# Patient Record
Sex: Male | Born: 1990 | Race: Black or African American | Hispanic: No | Marital: Single
Health system: Southern US, Community
[De-identification: ages and names within clinical notes are randomized; demographics above are authoritative.]

## PROBLEM LIST (undated history)

## (undated) DIAGNOSIS — E119 Type 2 diabetes mellitus without complications: Secondary | ICD-10-CM

## (undated) DIAGNOSIS — F319 Bipolar disorder, unspecified: Secondary | ICD-10-CM

## (undated) DIAGNOSIS — F259 Schizoaffective disorder, unspecified: Secondary | ICD-10-CM

## (undated) HISTORY — DX: Bipolar disorder, unspecified: F31.9

## (undated) HISTORY — DX: Schizoaffective disorder, unspecified: F25.9

---

## 2016-09-04 ENCOUNTER — Emergency Department (HOSPITAL_COMMUNITY)
Admission: EM | Admit: 2016-09-04 | Discharge: 2016-09-05 | Disposition: A | Discharge status: Home / Self Care | Payer: No Typology Code available for payment source | Attending: Emergency Medicine | Admitting: Emergency Medicine

## 2016-09-04 ENCOUNTER — Emergency Department (HOSPITAL_COMMUNITY): Payer: No Typology Code available for payment source

## 2016-09-04 ENCOUNTER — Encounter (HOSPITAL_COMMUNITY): Payer: Self-pay

## 2016-09-04 DIAGNOSIS — Y939 Activity, unspecified: Secondary | ICD-10-CM | POA: Insufficient documentation

## 2016-09-04 DIAGNOSIS — Y92410 Unspecified street and highway as the place of occurrence of the external cause: Secondary | ICD-10-CM | POA: Diagnosis not present

## 2016-09-04 DIAGNOSIS — S4992XA Unspecified injury of left shoulder and upper arm, initial encounter: Secondary | ICD-10-CM | POA: Diagnosis not present

## 2016-09-04 DIAGNOSIS — M25512 Pain in left shoulder: Secondary | ICD-10-CM

## 2016-09-04 DIAGNOSIS — Y999 Unspecified external cause status: Secondary | ICD-10-CM | POA: Insufficient documentation

## 2016-09-04 MED ORDER — IBUPROFEN 800 MG PO TABS: 800.0000 mg | ORAL_TABLET | Freq: Three times a day (TID) | ORAL | 0 refills | Status: DC

## 2016-09-04 MED ORDER — CYCLOBENZAPRINE HCL 10 MG PO TABS: 10.0000 mg | ORAL_TABLET | Freq: Two times a day (BID) | ORAL | 0 refills | Status: DC | PRN
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
## 2016-09-04 NOTE — ED Notes (Addendum)
Patient transported to X-ray 
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
## 2016-09-05 NOTE — ED Notes (Signed)
Pt departed in NAD, refused use of wheelchair.  

## 2017-04-07 ENCOUNTER — Other Ambulatory Visit
Admit: 2017-04-07 | Discharge: 2017-04-07 | Disposition: A | Discharge status: Home / Self Care | Payer: Self-pay | Attending: Family Medicine | Admitting: Family Medicine

## 2017-04-07 ENCOUNTER — Emergency Department
Admission: EM | Admit: 2017-04-07 | Discharge: 2017-04-07 | Discharge status: Absent without leave | Payer: No Typology Code available for payment source

## 2017-11-01 DIAGNOSIS — Z9151 Personal history of suicidal behavior: Secondary | ICD-10-CM

## 2017-11-01 DIAGNOSIS — E109 Type 1 diabetes mellitus without complications: Secondary | ICD-10-CM

## 2017-11-01 HISTORY — DX: Type 1 diabetes mellitus without complications: E10.9

## 2017-11-01 HISTORY — DX: Personal history of suicidal behavior: Z91.51

## 2018-02-03 ENCOUNTER — Emergency Department (HOSPITAL_COMMUNITY)
Admission: EM | Admit: 2018-02-03 | Discharge: 2018-02-04 | Disposition: A | Discharge status: Other Acute Inpatient Hospital | Payer: BLUE CROSS/BLUE SHIELD | Attending: Emergency Medicine | Admitting: Emergency Medicine

## 2018-02-03 ENCOUNTER — Encounter (HOSPITAL_COMMUNITY): Payer: Self-pay | Admitting: Emergency Medicine

## 2018-02-03 DIAGNOSIS — F322 Major depressive disorder, single episode, severe without psychotic features: Secondary | ICD-10-CM | POA: Diagnosis not present

## 2018-02-03 DIAGNOSIS — R45851 Suicidal ideations: Secondary | ICD-10-CM | POA: Insufficient documentation

## 2018-02-03 DIAGNOSIS — F419 Anxiety disorder, unspecified: Secondary | ICD-10-CM | POA: Insufficient documentation

## 2018-02-03 DIAGNOSIS — G4700 Insomnia, unspecified: Secondary | ICD-10-CM | POA: Insufficient documentation

## 2018-02-03 DIAGNOSIS — Z046 Encounter for general psychiatric examination, requested by authority: Secondary | ICD-10-CM | POA: Diagnosis present

## 2018-02-03 NOTE — ED Triage Notes (Signed)
Pt expressed suicidal thoughts of ending his life by jumping off a bridge or shooting himself.  Pt admits he is in the process of trying obtain a gun.  Family accompanied pt, very supportive.  Pt soft spoken, withdrawn.  Family is going to stay w/ pt per Charge RN until seen by provider.  Family took all the pt'ws belongings to the car to take home.  Pt wanded by security

## 2018-02-03 NOTE — ED Notes (Signed)
Called for sitter ?

## 2018-02-04 ENCOUNTER — Encounter (HOSPITAL_COMMUNITY): Payer: Self-pay

## 2018-02-04 ENCOUNTER — Other Ambulatory Visit: Payer: Self-pay

## 2018-02-04 ENCOUNTER — Inpatient Hospital Stay (HOSPITAL_COMMUNITY)
Admission: AD | Admit: 2018-02-04 | Discharge: 2018-02-10 | DRG: 885 | Disposition: A | Discharge status: Home / Self Care | Payer: BLUE CROSS/BLUE SHIELD | Source: Intra-hospital | Attending: Psychiatry | Admitting: Psychiatry

## 2018-02-04 DIAGNOSIS — Z810 Family history of intellectual disabilities: Secondary | ICD-10-CM

## 2018-02-04 DIAGNOSIS — F322 Major depressive disorder, single episode, severe without psychotic features: Secondary | ICD-10-CM | POA: Diagnosis not present

## 2018-02-04 DIAGNOSIS — Z818 Family history of other mental and behavioral disorders: Secondary | ICD-10-CM

## 2018-02-04 DIAGNOSIS — F333 Major depressive disorder, recurrent, severe with psychotic symptoms: Principal | ICD-10-CM

## 2018-02-04 DIAGNOSIS — F419 Anxiety disorder, unspecified: Secondary | ICD-10-CM | POA: Diagnosis present

## 2018-02-04 DIAGNOSIS — R45851 Suicidal ideations: Secondary | ICD-10-CM | POA: Diagnosis present

## 2018-02-04 DIAGNOSIS — F909 Attention-deficit hyperactivity disorder, unspecified type: Secondary | ICD-10-CM | POA: Diagnosis present

## 2018-02-04 DIAGNOSIS — G4700 Insomnia, unspecified: Secondary | ICD-10-CM | POA: Diagnosis present

## 2018-02-04 DIAGNOSIS — F332 Major depressive disorder, recurrent severe without psychotic features: Secondary | ICD-10-CM | POA: Diagnosis present

## 2018-02-04 LAB — COMPREHENSIVE METABOLIC PANEL
ALT: 74 U/L — ABNORMAL HIGH (ref 17–63)
AST: 39 U/L (ref 15–41)
Albumin: 4.3 g/dL (ref 3.5–5.0)
Alkaline Phosphatase: 65 U/L (ref 38–126)
Anion gap: 10 (ref 5–15)
BUN: 16 mg/dL (ref 6–20)
CO2: 26 mmol/L (ref 22–32)
Calcium: 9.8 mg/dL (ref 8.9–10.3)
Chloride: 103 mmol/L (ref 101–111)
Creatinine, Ser: 0.98 mg/dL (ref 0.61–1.24)
GFR calc Af Amer: >60 mL/min (ref >60)
GFR calc non Af Amer: >60 mL/min (ref >60)
Glucose, Bld: 113 mg/dL — ABNORMAL HIGH (ref 65–99)
Potassium: 4.2 mmol/L (ref 3.5–5.1)
Sodium: 139 mmol/L (ref 135–145)
Total Bilirubin: 0.4 mg/dL (ref 0.3–1.2)
Total Protein: 6.9 g/dL (ref 6.5–8.1)

## 2018-02-04 LAB — CBC
HCT: 44.8 % (ref 39.0–52.0)
Hemoglobin: 14.7 g/dL (ref 13.0–17.0)
MCH: 28.9 pg (ref 26.0–34.0)
MCHC: 32.8 g/dL (ref 30.0–36.0)
MCV: 88.2 fL (ref 78.0–100.0)
Platelets: 310 10*3/uL (ref 150–400)
RBC: 5.08 MIL/uL (ref 4.22–5.81)
RDW: 12.9 % (ref 11.5–15.5)
WBC: 5.4 10*3/uL (ref 4.0–10.5)

## 2018-02-04 LAB — SALICYLATE LEVEL: Salicylate Lvl: <7 mg/dL (ref 2.8–30.0)

## 2018-02-04 LAB — RAPID URINE DRUG SCREEN, HOSP PERFORMED
Amphetamines: NOT DETECTED
Barbiturates: NOT DETECTED
Benzodiazepines: NOT DETECTED
Cocaine: NOT DETECTED
Opiates: NOT DETECTED
Tetrahydrocannabinol: NOT DETECTED

## 2018-02-04 LAB — ACETAMINOPHEN LEVEL: Acetaminophen (Tylenol), Serum: <10 ug/mL — ABNORMAL LOW (ref 10–30)

## 2018-02-04 LAB — ETHANOL: Alcohol, Ethyl (B): <10 mg/dL (ref <10)

## 2018-02-04 MED ORDER — TRAZODONE HCL 50 MG PO TABS
50.0000 mg | ORAL_TABLET | Freq: Every evening | ORAL | Status: DC | PRN
  Filled 2018-02-04 (×2): qty 1

## 2018-02-04 MED ORDER — SERTRALINE HCL 50 MG PO TABS
50.0000 mg | ORAL_TABLET | Freq: Every day | ORAL | Status: DC
  Administered 2018-02-04 – 2018-02-10 (×7): 50 mg via ORAL
  Filled 2018-02-04 (×10): qty 1

## 2018-02-04 MED ORDER — ALUM & MAG HYDROXIDE-SIMETH 200-200-20 MG/5ML PO SUSP: 30.0000 mL | ORAL | Status: DC | PRN

## 2018-02-04 MED ORDER — HYDROXYZINE HCL 25 MG PO TABS
25.0000 mg | ORAL_TABLET | Freq: Four times a day (QID) | ORAL | Status: DC | PRN
  Filled 2018-02-04 (×2): qty 1

## 2018-02-04 MED ORDER — ACETAMINOPHEN 325 MG PO TABS: 650.0000 mg | ORAL_TABLET | Freq: Four times a day (QID) | ORAL | Status: DC | PRN

## 2018-02-04 MED ORDER — ENSURE ENLIVE PO LIQD
237.0000 mL | Freq: Two times a day (BID) | ORAL | Status: DC
  Administered 2018-02-04 – 2018-02-10 (×6): 237 mL via ORAL

## 2018-02-04 MED ORDER — MAGNESIUM HYDROXIDE 400 MG/5ML PO SUSP: 30.0000 mL | Freq: Every day | ORAL | Status: DC | PRN

## 2018-02-04 NOTE — Tx Team (Signed)
Initial Treatment Plan 02/04/2018 12:37 PM Jacob [name] [id number]    PATIENT STRESSORS: Occupational concerns Other: increased anxiety and loneliness   PATIENT STRENGTHS: Ability for insight Average or above average intelligence Communication skills Motivation for treatment/growth Physical Health Supportive family/friends   PATIENT IDENTIFIED PROBLEMS:   "loneliness"    "problems with coworker"    "anxiety"           DISCHARGE CRITERIA:  Improved stabilization in mood, thinking, and/or behavior Motivation to continue treatment in a less acute level of care Reduction of life-threatening or endangering symptoms to within safe limits  PRELIMINARY DISCHARGE PLAN: Attend aftercare/continuing care group Outpatient therapy Return to previous living arrangement  PATIENT/FAMILY INVOLVEMENT: This treatment plan has been presented to and reviewed with the patient, Jacob [name],  The patient has been given the opportunity to ask questions and make suggestions.  Shela NevinValerie S [name], RN 02/04/2018, 12:37 PM

## 2018-02-04 NOTE — [facility] Suicide Risk Assessment (Signed)
[facility]   Nursing information obtained from:   patient and chart Demographic factors:   27 year old single male , employed  Current Mental Status:   see below  Loss Factors:   27 year old child in foster care, being adopted , upcoming court date  Historical Factors:   denies prior psychiatric admissions, denies history of severe depression Risk Reduction Factors:   employed, resilience  Total Time spent with patient: 45 minutes Principal Problem:Suicidal Ideations  Diagnosis:   Patient Active Problem List   Diagnosis Date Noted  . Severe recurrent major depression without psychotic features (HCC) [F33.2] 02/04/2018    Continued Clinical Symptoms:  Alcohol Use Disorder Identification Test Final Score (AUDIT): 0 The "Alcohol Use Disorders Identification Test", Guidelines for Use in Primary Care, Second Edition.  World Science writerHealth Organization [facility](WHO). Score between 0-7:  no or low risk or alcohol related problems. Score between 8-15:  moderate risk of alcohol related problems. Score between 16-19:  high risk of alcohol related problems. Score 20 or above:  warrants further diagnostic evaluation for alcohol dependence and treatment.   CLINICAL FACTORS:  27 year old male, presents due to suicidal ideations , currently minimizes depression, but identifies significant psychosocial stressors    Psychiatric Specialty Exam: Physical Exam  ROS  Blood pressure 130/74, pulse 86, temperature 98.8 F (37.1 C), temperature source Oral, resp. rate 16, height 5' 7.5" (1.715 m), weight 59.6 kg (131 lb 6.4 oz), SpO2 99 %.Body mass index is 20.28 kg/m.  See admit note MSE   COGNITIVE FEATURES THAT CONTRIBUTE TO RISK:  Closed-mindedness and Loss of executive function    SUICIDE RISK:   Moderate:  Frequent suicidal ideation with limited intensity, and duration, some specificity in terms of plans, no associated intent, good self-control, limited dysphoria/symptomatology,  some risk factors present, and identifiable protective factors, including available and accessible social support.  PLAN OF CARE: Patient will be admitted to inpatient psychiatric unit for stabilization and safety. Will provide and encourage milieu participation. Provide medication management and maked adjustments as needed.  Will follow daily.    I certify that inpatient services furnished can reasonably be expected to improve the patient's condition.   Craige CottaFernando A Firman Petrow, MD 02/04/2018, 5:32 PM

## 2018-02-04 NOTE — ED Notes (Signed)
Pt voiced agreement and understanding of tx plan [facility] signed consent forms - [facility], copy sent to [facility] Records, and [facility].
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
## 2018-02-04 NOTE — ED Provider Notes (Signed)
[facility] EMERGENCY DEPARTMENT Provider Note   CSN: [id number] Arrival date & time: 02/03/18  2316     History   Chief Complaint Chief Complaint  Patient presents with  . Suicidal    HPI Ballard Congrove is a 27 y.o. male.  27 year old male with no significant past medical history presents to the emergency department for suicidal thoughts.  He reports onset of suicidal ideations today secondary to many "obstacles in my way".  He reports plan to jump off a bridge.  He has also had thoughts about shooting himself in the head.  He does not own or have access to firearms, but was trying to obtain a gun recently.  He reports difficulty between his multiple jobs.  He is also trying to obtain custody of his daughter.  He recently was charged with multiple marijuana misdemeanors.  He reports difficulty balancing these classes as well as the thought of going back to school.  He denies any alcohol or illicit drug use today.  No history of behavioral health hospitalizations.  He presents with family voluntarily.  The history is provided by the patient. No language interpreter was used.    History reviewed. No pertinent past medical history.  There are no active problems to display for this patient.   History reviewed. No pertinent surgical history.      Home Medications    Prior to Admission medications   Medication Sig Start Date End Date Taking? Authorizing Provider  cyclobenzaprine (FLEXERIL) 10 MG tablet Take 1 tablet (10 mg total) by mouth 2 (two) times daily as needed for muscle spasms. Patient not taking: Reported on 02/04/2018 09/04/16   Emi Holes, [name]  ibuprofen (ADVIL,MOTRIN) 800 MG tablet Take 1 tablet (800 mg total) by mouth 3 (three) times daily. Patient not taking: Reported on 02/04/2018 09/04/16   Emi Holes, [name]    Family History No family history on file.  Social History Social History   Tobacco Use  . Smoking status: Never Smoker  .  Smokeless tobacco: Never Used  Substance Use Topics  . Alcohol use: No  . Drug use: Not on file     Allergies   Patient has no known allergies.   Review of Systems Review of Systems Ten systems reviewed and are negative for acute change, except as noted in the HPI.    Physical Exam Updated Vital Signs BP (!) 130/105 (BP Location: Right Arm)   Pulse 66   Temp 97.7 F (36.5 C) (Oral)   Resp 16   SpO2 100%   Physical Exam  Constitutional: He is oriented to person, place, and time. He appears well-developed and well-nourished. No distress.  Nontoxic appearing and in NAD  HENT:  Head: Normocephalic and atraumatic.  Eyes: Conjunctivae and EOM are normal. No scleral icterus.  Neck: Normal range of motion.  Cardiovascular: Normal rate, regular rhythm and intact distal pulses.  Pulmonary/Chest: Effort normal. No respiratory distress.  Respirations even and unlabored  Musculoskeletal: Normal range of motion.  Neurological: He is alert and oriented to person, place, and time. He exhibits normal muscle tone. Coordination normal.  Skin: Skin is warm and dry. No rash noted. He is not diaphoretic. No erythema. No pallor.  Psychiatric: He has a normal mood and affect. His behavior is normal. He expresses suicidal ideation. He expresses no homicidal ideation. He expresses suicidal plans. He expresses no homicidal plans.  Reports SI with plan to jump off bridge.  Nursing note and vitals reviewed.  ED Treatments / Results  Labs (all labs ordered are listed, but only abnormal results are displayed) Labs Reviewed  COMPREHENSIVE METABOLIC PANEL - Abnormal; Notable for the following components:      Result Value   Glucose, Bld 113 (*)    ALT 74 (*)    All other components within normal limits  ACETAMINOPHEN LEVEL - Abnormal; Notable for the following components:   Acetaminophen (Tylenol), Serum <10 (*)    All other components within normal limits  ETHANOL  SALICYLATE LEVEL  CBC    RAPID URINE DRUG SCREEN, HOSP PERFORMED    EKG None  Radiology No results found.  Procedures Procedures (including critical care time)  Medications Ordered in ED Medications - No data to display   Initial Impression / Assessment and Plan / ED Course  I have reviewed the triage vital signs and the nursing notes.  Pertinent labs & imaging results that were available during my care of the patient were reviewed by me and considered in my medical decision making (see chart for details).     Patient presenting for psychiatric evaluation.  He has been medically cleared and evaluated by TTS who recommend inpatient treatment.  Patient currently pending placement.  Disposition to be determined by oncoming ED provider.   Final Clinical Impressions(s) / ED Diagnoses   Final diagnoses:  Current severe episode of major depressive disorder without psychotic features without prior episode [facility](HCC)    ED Discharge Orders    None       Antony MaduraHumes, Maresha Anastos, [name] 02/04/18 0546    Zadie RhineWickline, Donald, MD 02/04/18 [id number]

## 2018-02-04 NOTE — Progress Notes (Signed)
Pt [facility] been [facility]. Pt can arrive after 0800 on Saturday, February 04, 2018.  Room: 400-2 Attending: Dr. Jama Flavorsobos Call to Report: [phone]  This information was provided to pt's nurse, Sherrilyn RistKari RN at 1557 on 02/04/18.
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
## 2018-02-04 NOTE — [facility] Group Notes (Signed)
[facility] Group Notes:  (Nursing)  Date:  02/04/2018  Time:  4:00 PM Type of Therapy:  Nurse Education  Participation Level:  Active  Participation Quality:  Appropriate  Affect:  Appropriate  Cognitive:  Appropriate  Insight:  Appropriate  Engagement in Group:  Engaged  Modes of Intervention:  Discussion, Education and Problem-solving  Summary of Progress/Problems: Patient attended group led by RN : Problem Solving/Open Forum Questions  Shela NevinValerie S Kayron Hicklin 02/04/2018, 5:04 PM

## 2018-02-04 NOTE — Plan of Care (Signed)
  Problem: Education: Goal: Knowledge of [facility] Education information/materials will improve Outcome: Progressing Goal: Verbalization of understanding the information provided will improve Outcome: Progressing   

## 2018-02-04 NOTE — H&P (Signed)
Psychiatric Admission Assessment Adult  Patient Identification: Jacob [name] MRN:  [id number] Date of Evaluation:  02/04/2018 Chief Complaint:   " I was feeling bad " Principal Diagnosis: Depression, Suicidal Ideations  Diagnosis:   Patient Active Problem List   Diagnosis Date Noted  . Severe recurrent major depression without psychotic features ([place]) [F33.2] 02/04/2018   History of Present Illness:27 year old single male,  vague historian . States that on day of admission he had been feeling depressed, with low energy level and had not gone to work due to feeling " bad " and  low energy . He presented to ED voluntarily due to suicidal ideations/reported thoughts of jumping off a bridge or shooting self . Currently states  that these thoughts occurred on day of admission and that he had not felt suicidal prior to that.  He also minimizes recent depression or significant neuro-vegetative symptoms leading to admission. Does not identify any specific stressors that contributed to feeling worse on day of admission, but he does describe chronic stressors-  states that her 27 year old child is in foster care in [place] and that she is up for adoption. He also has upcoming court date due to cannabis possession/ DUI  charges.  Endorses history of Cannabis Use Disorder, but states he has not used in several weeks.     Associated Signs/Symptoms: Depression Symptoms:  suicidal thoughts with specific plan, denies pervasive sadness or changes in sleep, appetite or energy level, denies anhedonia. Describes negative affect he experienced prior to admission as a " bad day " rather than a pattern of depression. (Hypo) Manic Symptoms:  Does not endorse  Anxiety Symptoms:  Reports some increased anxiety recently  Psychotic Symptoms:  Denies  PTSD Symptoms: Denies PTSD symptoms Total Time spent with patient: 45 minutes  Past Psychiatric History: no prior psychiatric admissions, states he has never attempted  suicide, denies history of self cutting . Denies history of psychosis, At this time denies history of prior depressive episodes . Denies history of mania, denies history of violence . Reports history of ADHD diagnosis as child for which he was prescribed Ritalin. States he has not been on any psychiatric medications for years .  Is the patient at risk to self? Yes.    Has the patient been a risk to self in the past 6 months? No.  Has the patient been a risk to self within the distant past? No.  Is the patient a risk to others? No.  Has the patient been a risk to others in the past 6 months? No.  Has the patient been a risk to others within the distant past? No.   Prior Inpatient Therapy:  no  Prior Outpatient Therapy:  no   Alcohol Screening: Patient refused Alcohol Screening Tool: (na) 1. How often do you have a drink containing alcohol?: Never 2. How many drinks containing alcohol do you have on a typical day when you are drinking?: 1 or 2 3. How often do you have six or more drinks on one occasion?: Never AUDIT-C Score: 0 9. Have you or someone else been injured as a result of your drinking?: No 10. Has a relative or friend or a doctor or another health worker been concerned about your drinking or suggested you cut down?: No Alcohol Use Disorder Identification Test Final Score (AUDIT): 0 Intervention/Follow-up: AUDIT Score <7 follow-up not indicated Substance Abuse History in the last 12 months:  Denies alcohol abuse, reports history of cannabis use disorder, states  he stopped smoking 2 months ago. Consequences of Substance Abuse: Does not endorse  Previous Psychotropic Medications: states he had been on Ritalin in the past as a child for ADHD.States he has not been on any medication for many years . Psychological Evaluations: no  Past Medical History: Denies medical illnesses, NKDA. Family History: Parents alive, patient is currently living with them, has two older brothers Family  Psychiatric  History: states one brother is developmentally disabled . States that father has history of depression, no suicides in family Tobacco Screening:  Does not smoke . Social History: single, has one daughter aged two, who is currently in foster care in [place]. 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 It was an open adoption.   Allergies:  No Known Allergies Lab Results:  Results for orders placed or performed during the hospital encounter of 02/03/18 (from the past 48 hour(s))  Rapid urine drug screen (hospital performed)     Status: None   Collection Time: 02/03/18 11:56 PM  Result Value Ref Range   Opiates NONE DETECTED NONE DETECTED   Cocaine NONE DETECTED NONE DETECTED   Benzodiazepines NONE DETECTED NONE DETECTED   Amphetamines NONE DETECTED NONE DETECTED   Tetrahydrocannabinol NONE DETECTED NONE DETECTED   Barbiturates NONE DETECTED NONE DETECTED    Comment: (NOTE) DRUG SCREEN FOR MEDICAL PURPOSES ONLY.  IF CONFIRMATION IS NEEDED FOR ANY PURPOSE, NOTIFY LAB WITHIN 5 DAYS. LOWEST DETECTABLE LIMITS FOR URINE DRUG SCREEN Drug Class                     Cutoff (ng/mL) Amphetamine and metabolites    1000 Barbiturate and metabolites    200 Benzodiazepine                 505 Tricyclics and metabolites     300 Opiates and metabolites        300 Cocaine and metabolites        300 THC                             50 Performed at [facility], [place] [address] [place], [place] [address]   Comprehensive metabolic panel     Status: Abnormal   Collection Time: 02/03/18 11:57 PM  Result Value Ref Range   Sodium 139 135 - 145 mmol/L   Potassium 4.2 3.5 - 5.1 mmol/L   Chloride 103 101 - 111 mmol/L   CO2 26 22 - 32 mmol/L   Glucose, Bld 113 (H) 65 - 99 mg/dL   BUN 16 6 - 20 mg/dL   Creatinine, Ser 0.98 0.61 - 1.24 mg/dL   Calcium 9.8 8.9 - 10.3 mg/dL   Total Protein 6.9 6.5 - 8.1 g/dL   Albumin 4.3 3.5 - 5.0 g/dL   AST 39 15 - 41 U/L   ALT 74 (H) 17 - 63  U/L   Alkaline Phosphatase 65 38 - 126 U/L   Total Bilirubin 0.4 0.3 - 1.2 mg/dL   GFR calc non Af Amer >60 >60 mL/min   GFR calc Af Amer >60 >60 mL/min    Comment: (NOTE) The eGFR has been calculated using the CKD EPI equation. This calculation has not been validated in all clinical situations. eGFR's persistently <60 mL/min signify possible Chronic Kidney Disease.    Anion gap 10 5 - 15    Comment: Performed at [facility] [address] [place], [place] [address]  cbc     Status: None   Collection Time: 02/03/18 11:57 PM  Result Value Ref Range   WBC 5.4 4.0 - 10.5 K/uL   RBC 5.08 4.22 - 5.81 MIL/uL   Hemoglobin 14.7 13.0 - 17.0 g/dL   HCT 44.8 39.0 - 52.0 %   MCV 88.2 78.0 - 100.0 fL   MCH 28.9 26.0 - 34.0 pg   MCHC 32.8 30.0 - 36.0 g/dL   RDW 12.9 11.5 - 15.5 %   Platelets 310 150 - 400 K/uL    Comment: Performed at [facility], [place] [address] [place], [place] [address]  Ethanol     Status: None   Collection Time: 02/03/18 11:58 PM  Result Value Ref Range   Alcohol, Ethyl (B) <10 <10 mg/dL    Comment:        LOWEST DETECTABLE LIMIT FOR SERUM ALCOHOL IS 10 mg/dL FOR MEDICAL PURPOSES ONLY Performed at [facility], [place] [address], [place], [place] [address]   Salicylate level     Status: None   Collection Time: 02/03/18 11:58 PM  Result Value Ref Range   Salicylate Lvl <7.6 2.8 - 30.0 mg/dL     Comment: Performed at [facility] [address], [place], [place] [address]  Acetaminophen level     Status: Abnormal   Collection Time: 02/03/18 11:58 PM  Result Value Ref Range   Acetaminophen (Tylenol), Serum <10 (L) 10 - 30 ug/mL    Comment:        THERAPEUTIC CONCENTRATIONS VARY SIGNIFICANTLY. A RANGE OF 10-30 ug/mL MAY BE AN EFFECTIVE CONCENTRATION FOR MANY PATIENTS. HOWEVER, SOME ARE BEST TREATED AT CONCENTRATIONS OUTSIDE THIS RANGE. ACETAMINOPHEN CONCENTRATIONS >150 ug/mL AT 4 HOURS AFTER INGESTION AND >50 ug/mL AT 12 HOURS AFTER INGESTION ARE OFTEN ASSOCIATED WITH TOXIC REACTIONS. Performed at [facility], [place] [address], [place], [place] [address]     Blood Alcohol level:  Lab Results  Component Value Date   ETH <10 24/58/0998    Metabolic Disorder Labs:  No results found for: HGBA1C, MPG No results found for: PROLACTIN No results found for: CHOL, TRIG, HDL, CHOLHDL, VLDL, LDLCALC  Current Medications: Current Facility-Administered Medications  Medication Dose Route Frequency Provider Last Rate Last Dose  . acetaminophen (TYLENOL) tablet 650 mg  650 mg Oral Q6H PRN Rozetta Nunnery, NP      . alum & mag hydroxide-simeth (MAALOX/MYLANTA) 200-200-20 MG/5ML suspension 30 mL  30 mL Oral Q4H PRN Lindon Romp A, NP      . feeding supplement (ENSURE ENLIVE) (ENSURE ENLIVE) liquid 237 mL  237 mL Oral BID BM Money, Darnelle Maffucci B, FNP      . magnesium hydroxide (MILK OF MAGNESIA) suspension 30 mL  30 mL Oral Daily PRN Rozetta Nunnery, NP       PTA Medications: Medications Prior to Admission  Medication Sig Dispense Refill Last  Dose  . cyclobenzaprine (FLEXERIL) 10 MG tablet Take 1 tablet (10 mg total) by mouth 2 (two) times daily as needed for muscle spasms. (Patient not taking: Reported on 02/04/2018) 14 tablet 0 Completed Course at Unknown time  . ibuprofen (ADVIL,MOTRIN) 800 MG tablet Take 1 tablet (800 mg total) by mouth 3 (three) times daily. (Patient not  taking: Reported on 02/04/2018) 21 tablet 0 Completed Course at Unknown time    Musculoskeletal: Strength & Muscle Tone: within normal limits Gait & Station: normal Patient leans: N/A  Psychiatric Specialty Exam: Physical Exam  Review of Systems  Constitutional: Negative.   HENT: Negative.   Eyes: Negative.   Respiratory: Negative.   Cardiovascular: Negative.   Gastrointestinal: Negative.   Genitourinary: Negative.   Musculoskeletal: Negative.   Skin: Negative.   Neurological: Negative for seizures and headaches.  Endo/Heme/Allergies: Negative.   Psychiatric/Behavioral: Positive for suicidal ideas.  All other systems reviewed and are negative.   Blood pressure 130/74, pulse 86, temperature 98.8 F (37.1 C), temperature source Oral, resp. rate 16, height 5' 7.5" (1.715 m), weight 59.6 kg (131 lb 6.4 oz), SpO2 99 %.Body mass index is 20.28 kg/m.  General Appearance: Fairly Groomed  Eye Contact:  Fair  Speech:  Normal Rate  Volume:  Normal  Mood:  denies feeling depressed   Affect:  reactive, anxious  Thought Process:  Disorganized and Descriptions of Associations: Tangential- patient noted to become tangential with open ended questions  Orientation:  Other:  oriented x 3.   Thought Content:  denies hallucinations, no delusions expressed   Suicidal Thoughts:  No denies any suicidal or self injurious ideations, denies any homicidal or violent ideations, contracts for safety on unit   Homicidal Thoughts:  No  Memory:  recent and remote grossly intact  recall 3/3 immediate and 2/3 at 3 minutes   Judgement:  Other:  fair   Insight:  Fair  Psychomotor Activity:  Normal  Concentration:  Concentration: Good and Attention Span: Good  Recall:  Good  Fund of Knowledge:  Good  Language:  Good  Akathisia:  Negative  Handed:  Left  AIMS (if indicated):     Assets:  Communication Skills Desire for Improvement Resilience  ADL's:  Intact  Cognition:  WNL  Sleep:       Treatment  Plan Summary: Daily contact with patient to assess and evaluate symptoms and progress in treatment, Medication management, Plan inpatient treatment  and medications as below  Observation Level/Precautions:  15 minute checks  Laboratory:  as needed  Psychotherapy:  [facility], group therapy   Medications:  Although patient currently describes recent episode as time limited to day of admission, does report recent anxiety, and has been facing significant stressors. He expresses interest in an antidepressant. Agrees to Zoloft, start at 46 mgrs QDAY   Consultations:  As needed  Discharge Concerns:  -  Estimated LOS: 4-5 days   Other:     Physician Treatment Plan for Primary Diagnosis:  Suicidal Ideations Long Term Goal(s): Improvement in symptoms so as ready for discharge  Short Term Goals: Ability to identify changes in lifestyle to reduce recurrence of condition will improve and Ability to identify and develop effective coping behaviors will improve  Physician Treatment Plan for Secondary Diagnosis: Cannabis Use Disorder  Long Term Goal(s): Improvement in symptoms so as ready for discharge  Short Term Goals: Ability to identify triggers associated with substance abuse/mental health issues will improve  I certify that inpatient services furnished can reasonably be  expected to improve the patient's condition.    Jenne Campus, MD 4/6/20193:02 PM

## 2018-02-04 NOTE — Progress Notes (Signed)
Patient is a 27 year old male who [facility] for increasing anxiety and suicidal ideations. Pt reports no prior hospitalizations and does not see a psychiatrist. Patient denies SI/HI and A/V hallucinations at this time, but reports having suicidal thoughts prior to admission.  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 Pt endorses feeling very lonely - not having a social life because of his work. Pt denies drug use, alcohol and smoking. Pt reports only sleeping 4 hours a night. Patient pleasant and cooperative- making good eye contact - during admission interview- answering questions appropriately. Admission paperwork completed and signed. Verbal understanding expressed. Patient in scrubs and had no belongings with him. Skin assessment revealed no abnormalities except two small superficial splinters on the bottom of right foot (no pain or redness). Patient oriented to unit. Q 15 min checks initiated for safety.

## 2018-02-04 NOTE — ED Notes (Signed)
Mother and grandmother visiting w/pt.

## 2018-02-04 NOTE — ED Notes (Signed)
Pt gave verbal permission to advise his mother and grandmother who are present that [facility] and will be transported this am.

## 2018-02-04 NOTE — Progress Notes (Signed)
D.  Pt's first day on the unit, remained in bed and did not get up for evening wrap up group.  Pt denies complaints at this time.  Pt denies SI/HI/AVH.  Pt has remained in bed.  A.  Support and encouragement offered  R.  Pt remains safe on the unit, will continue to monitor.

## 2018-02-04 NOTE — Progress Notes (Signed)
Did not attend group 

## 2018-02-05 MED ORDER — ARIPIPRAZOLE 2 MG PO TABS
2.0000 mg | ORAL_TABLET | Freq: Every day | ORAL | Status: DC
  Administered 2018-02-05 – 2018-02-06 (×2): 2 mg via ORAL
  Filled 2018-02-05 (×4): qty 1

## 2018-02-05 NOTE — Plan of Care (Signed)
Patient verbalizes understanding of information, education provided. 

## 2018-02-05 NOTE — [facility] Group Notes (Signed)
[facility] LCSW Group Therapy Note  Date/Time: 02/05/18, 1000  Type of Therapy/Topic:  Group Therapy:  Feelings about Diagnosis  Participation Level:  Minimal   Mood: pleasant   Description of Group:    This group will allow patients to explore their thoughts and feelings about diagnoses they have received. Patients will be guided to explore their level of understanding and acceptance of these diagnoses. Facilitator will encourage patients to process their thoughts and feelings about the reactions of others to their diagnosis, and will guide patients in identifying ways to discuss their diagnosis with significant others in their lives. This group will be process-oriented, with patients participating in exploration of their own experiences as well as giving and receiving support and challenge from other group members.   Therapeutic Goals: 1. Patient will demonstrate understanding of diagnosis as evidence by identifying two or more symptoms of the disorder:  2. Patient will be able to express two feelings regarding the diagnosis 3. Patient will demonstrate ability to communicate their needs through discussion and/or role plays  Summary of Patient Progress: Pt had minimal participation in the group discussion.  He did respond to CSW questions.  Pt identified feeling discouraged currently but related it more to his current depression.        Therapeutic Modalities:   Cognitive Behavioral Therapy Brief Therapy Feelings Identification   Jacob SquibbGreg Luci Bellucci, LCSW

## 2018-02-05 NOTE — Progress Notes (Signed)
D.  Pt pleasant on approach, minimal conversation.  Pt has remained in bed most of early part of shift.  Pt did get up for a snack before bed, denied need for medication to help him sleep.  Pt denies SI/HI/AVH at this time.  A.  Support and encouragement offered  R.  Pt remains safe on the unit, will continue to monitor.

## 2018-02-05 NOTE — Progress Notes (Addendum)
[facility] MD Progress Note  02/05/2018 11:25 AM Jacob [name]  MRN:  [id number] Subjective: He reports he is feeling "all right".  Denies suicidal ideations.  Minimizes depression and states that symptoms that led to admission where generally short-lived/"it was a bad day". Denies medication side effects thus far. Objective: I have reviewed chart notes, discussed case with staff, met with patient. 27 year old single male who presented voluntarily due to suicidal ideations and some neurovegetative symptoms of depression.  He continues to minimize depression or neurovegetative symptoms and states he had not been feeling depressed up to the day of admission. Presents with some tangentiality and thought disorder, denies hallucinations and does not appear internally preoccupied, no delusions expressed but today does report some self-referential ideations.  States he feels that coworkers get into his lunch bag "just a mess with me" and he feels that they call him names behind his back.  Of note, denies any history of psychosis, reports prior history of cannabis abuse but states he has not used in several weeks. Behavior on unit in good control, pleasant on approach, limited participation in milieu. Denies medication side effects. Denies suicidal ideations.    Principal Problem: Depression, Unspecified Diagnosis:   Patient Active Problem List   Diagnosis Date Noted  . Severe recurrent major depression without psychotic features ([place]) [F33.2] 02/04/2018   Total Time spent with patient: 20 minutes Past Medical History: History reviewed. No pertinent past medical history. History reviewed. No pertinent surgical history. Family History: History reviewed. No pertinent family history.  Social History:  Social History   Substance and Sexual Activity  Alcohol Use No     Social History   Substance and Sexual Activity  Drug Use Not on file    Social History   Socioeconomic History  . Marital status:  Single    Spouse name: Not on file  . Number of children: Not on file  . Years of education: Not on file  . Highest education level: Not on file  Occupational History  . Not on file  Social Needs  . Financial resource strain: Not on file  . Food insecurity:    Worry: Not on file    Inability: Not on file  . Transportation needs:    Medical: Not on file    Non-medical: Not on file  Tobacco Use  . Smoking status: Never Smoker  . Smokeless tobacco: Never Used  Substance and Sexual Activity  . Alcohol use: No  . Drug use: Not on file  . Sexual activity: Not on file  Lifestyle  . Physical activity:    Days per week: Not on file    Minutes per session: Not on file  . Stress: Not on file  Relationships  . Social connections:    Talks on phone: Not on file    Gets together: Not on file    Attends religious service: Not on file    Active member of club or organization: Not on file    Attends meetings of clubs or organizations: Not on file    Relationship status: Not on file  Other Topics Concern  . Not on file  Social History Narrative  . Not on file   Additional Social History:   Sleep: Fair  Appetite:  Good  Current Medications: Current Facility-Administered Medications  Medication Dose Route Frequency Provider Last Rate Last Dose  . acetaminophen (TYLENOL) tablet 650 mg  650 mg Oral Q6H PRN Rozetta Nunnery, NP      .  alum & mag hydroxide-simeth (MAALOX/MYLANTA) 200-200-20 MG/5ML suspension 30 mL  30 mL Oral Q4H PRN Lindon Romp A, NP      . feeding supplement (ENSURE ENLIVE) (ENSURE ENLIVE) liquid 237 mL  237 mL Oral BID BM Money, Lowry Ram, FNP   237 mL at 02/05/18 0881  . hydrOXYzine (ATARAX/VISTARIL) tablet 25 mg  25 mg Oral Q6H PRN Delmy Holdren A, MD      . magnesium hydroxide (MILK OF MAGNESIA) suspension 30 mL  30 mL Oral Daily PRN Lindon Romp A, NP      . sertraline (ZOLOFT) tablet 50 mg  50 mg Oral Daily Trevone Prestwood, Myer Peer, MD   50 mg at 02/05/18 1031  .  traZODone (DESYREL) tablet 50 mg  50 mg Oral QHS PRN Casmer Yepiz, Myer Peer, MD        Lab Results:  Results for orders placed or performed during the hospital encounter of 02/03/18 (from the past 48 hour(s))  Rapid urine drug screen (hospital performed)     Status: None   Collection Time: 02/03/18 11:56 PM  Result Value Ref Range   Opiates NONE DETECTED NONE DETECTED   Cocaine NONE DETECTED NONE DETECTED   Benzodiazepines NONE DETECTED NONE DETECTED   Amphetamines NONE DETECTED NONE DETECTED   Tetrahydrocannabinol NONE DETECTED NONE DETECTED   Barbiturates NONE DETECTED NONE DETECTED    Comment: (NOTE) DRUG SCREEN FOR MEDICAL PURPOSES ONLY.  IF CONFIRMATION IS NEEDED FOR ANY PURPOSE, NOTIFY LAB WITHIN 5 DAYS. LOWEST DETECTABLE LIMITS FOR URINE DRUG SCREEN Drug Class                     Cutoff (ng/mL) Amphetamine and metabolites    1000 Barbiturate and metabolites    200 Benzodiazepine                 594 Tricyclics and metabolites     300 Opiates and metabolites        300 Cocaine and metabolites        300 THC                            50 Performed at [facility], [place] [address] [place], [place] [address]   Comprehensive metabolic panel     Status: Abnormal   Collection Time: 02/03/18 11:57 PM  Result Value Ref Range   Sodium 139 135 - 145 mmol/L   Potassium 4.2 3.5 - 5.1 mmol/L   Chloride 103 101 - 111 mmol/L   CO2 26 22 - 32 mmol/L   Glucose, Bld 113 (H) 65 - 99 mg/dL   BUN 16 6 - 20 mg/dL   Creatinine, Ser 0.98 0.61 - 1.24 mg/dL   Calcium 9.8 8.9 - 10.3 mg/dL   Total Protein 6.9 6.5 - 8.1 g/dL   Albumin 4.3 3.5 - 5.0 g/dL   AST 39 15 - 41 U/L   ALT 74 (H) 17 - 63 U/L   Alkaline Phosphatase 65 38 - 126 U/L   Total Bilirubin 0.4 0.3 - 1.2 mg/dL   GFR calc non Af Amer >60 >60 mL/min   GFR calc Af Amer >60 >60 mL/min    Comment: (NOTE) The eGFR has been calculated using the CKD EPI equation. This calculation has not been validated in all clinical  situations. eGFR's persistently <60 mL/min signify possible Chronic Kidney Disease.    Anion gap 10 5 - 15    Comment:  Performed at [facility], [place] [address], [place], [place] [address]  cbc     Status: None   Collection Time: 02/03/18 11:57 PM  Result Value Ref Range   WBC 5.4 4.0 - 10.5 K/uL   RBC 5.08 4.22 - 5.81 MIL/uL   Hemoglobin 14.7 13.0 - 17.0 g/dL   HCT 44.8 39.0 - 52.0 %   MCV 88.2 78.0 - 100.0 fL   MCH 28.9 26.0 - 34.0 pg   MCHC 32.8 30.0 - 36.0 g/dL   RDW 12.9 11.5 - 15.5 %   Platelets 310 150 - 400 K/uL    Comment: Performed at [facility], [place] [address] [place], [place] [address]  Ethanol     Status: None   Collection Time: 02/03/18 11:58 PM  Result Value Ref Range   Alcohol, Ethyl (B) <10 <10 mg/dL    Comment:        LOWEST DETECTABLE LIMIT FOR SERUM ALCOHOL IS 10 mg/dL FOR MEDICAL PURPOSES ONLY Performed at [facility], [place] [address] [place], [place] [address]   Salicylate level     Status: None   Collection Time: 02/03/18 11:58 PM  Result Value Ref Range   Salicylate Lvl <4.4 2.8 - 30.0 mg/dL    Comment: Performed at [facility] [address] [place], [place] [address]  Acetaminophen level     Status: Abnormal   Collection Time: 02/03/18 11:58 PM  Result Value Ref Range   Acetaminophen (Tylenol), Serum <10 (L) 10 - 30 ug/mL    Comment:        THERAPEUTIC CONCENTRATIONS VARY SIGNIFICANTLY. A RANGE OF 10-30 ug/mL MAY BE AN EFFECTIVE CONCENTRATION FOR MANY PATIENTS. HOWEVER, SOME ARE BEST TREATED AT CONCENTRATIONS OUTSIDE THIS RANGE. ACETAMINOPHEN CONCENTRATIONS >150 ug/mL AT 4 HOURS AFTER INGESTION AND >50 ug/mL AT 12 HOURS AFTER INGESTION ARE OFTEN ASSOCIATED WITH TOXIC REACTIONS. Performed at [facility], [place] [address] [place], [place] [address]     Blood Alcohol level:  Lab Results  Component Value Date   ETH <10 19/50/9326    Metabolic Disorder Labs: No results found for: HGBA1C, MPG No  results found for: PROLACTIN No results found for: CHOL, TRIG, HDL, CHOLHDL, VLDL, LDLCALC  Physical Findings: AIMS: Facial and Oral Movements Muscles of Facial Expression: None, normal Lips and Perioral Area: None, normal Jaw: None, normal Tongue: None, normal,Extremity Movements Upper (arms, wrists, hands, fingers): None, normal Lower (legs, knees, ankles, toes): None, normal, Trunk Movements Neck, shoulders, hips: None, normal, Overall Severity Severity of abnormal movements (highest score from questions above): None, normal Incapacitation due to abnormal movements: None, normal Patient's awareness of abnormal movements (rate only patient's report): No Awareness, Dental Status Current problems with teeth and/or dentures?: No Does patient usually wear dentures?: No  CIWA:    COWS:     Musculoskeletal: Strength & Muscle Tone: within normal limits Gait & Station: normal Patient leans: N/A  Psychiatric Specialty Exam: Physical Exam  ROS denies headache, denies chest pain, denies dyspnea, no vomiting  Blood pressure (!) 138/95, pulse 97, temperature 98.6 F (37 C), temperature source Oral, resp. rate 18, height 5' 7.5" (1.715 m), weight 59.6 kg (131 lb 6.4 oz), SpO2 99 %.Body mass index is 20.28 kg/m.  General Appearance: Well groomed in hospital garb  Eye Contact:  Fair  Speech:  Normal Rate  Volume:  Normal  Mood:  Reports mood is "okay"  Affect:  Appropriate and Reactive, smiles at times  during session  Thought Process: Noted to become disorganized/ tangential with open-ended questions  Orientation:  Other:  Fully alert and attentive  Thought Content:  Denies hallucinations and does not appear internally preoccupied, no delusions expressed, consider self-referential ideations  Suicidal Thoughts:  No denies any suicidal ideations, contracts for safety on unit, denies any homicidal or violent ideations towards anybody  Homicidal Thoughts:  No  Memory:  Recent and remote  grossly intact  Judgement:  Fair  Insight:  Fair  Psychomotor Activity:  Normal  Concentration:  Concentration: Good and Attention Span: Good  Recall:  Good  Fund of Knowledge:  Good  Language:  Good  Akathisia:  Negative  Handed:  Right  AIMS (if indicated):     Assets:  Communication Skills Desire for Improvement Resilience  ADL's:  Intact  Cognition:  WNL  Sleep:  Number of Hours: 5.75   Assessment -patient presents calm, polite, superficial.  He  denies depression at present, denies suicidal ideations, describes suicidal ideations and feelings of depression as limited to 1 day rather than a pattern of depression.  He presents with a vaguely tangential thought process and today describes he feels that coworkers tamper with his lunch bag and call him names behind his back.  He does not appear internally preoccupied and denies any hallucinations, and denies any history of psychosis. Behavior on unit is in good control, no agitation   Treatment Plan Summary: Daily contact with patient to assess and evaluate symptoms and progress in treatment, Medication management, Plan inpatient treatment  and medications as below Encourage group and milieu participation to work on coping skills and symptom reduction. Continue Zoloft at 50 mg a day for depression and anxiety. Agrees to start Abilify at 2 mg daily for augmentation and to address possible psychotic symptoms.  Side effects discussed.  Would titrate gradually as needed. Continue trazodone 50 mg nightly as needed for insomnia Continue hydroxyzine 25 mg every 6 hours as needed for anxiety. Treatment team working on disposition plans.  With expressed written consent, team to contact family in order to obtain collateral information.  Check HgbA1C, Lipid Panel, TSH Jenne Campus, MD 02/05/2018, 11:25 AM

## 2018-02-05 NOTE — Progress Notes (Signed)
D: Patient observed up and visible in the dayroom, interacting with peers. Guarded, superficial with this Clinical research associatewriter. Patient's affect flat, mood ambivalent. Per self inventory and discussions with writer, rates depression at a 3/10, hopelessness at a 4/10 and anxiety at a 5/10. Rates sleep as fair, appetite as good, energy as normal and concentration as good.  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   A: Medicated per orders, no prns requested or required. Orders received to start abilify and med education given. Level III obs in place for safety. Emotional support offered and self inventory reviewed. Encouraged completion of Suicide Safety Plan and programming participation. Discussed POC with MD, SW.    R: Patient verbalizes understanding of POC. Patient denies SI/HI/AVH and remains safe on level III obs. Will continue to monitor closely and make verbal contact frequently.

## 2018-02-06 LAB — LIPID PANEL
Cholesterol: 204 mg/dL — ABNORMAL HIGH (ref 0–200)
HDL: 81 mg/dL (ref >40)
LDL Cholesterol: 114 mg/dL — ABNORMAL HIGH (ref 0–99)
Total CHOL/HDL Ratio: 2.5 RATIO
Triglycerides: 43 mg/dL (ref <150)
VLDL: 9 mg/dL (ref 0–40)

## 2018-02-06 LAB — HEMOGLOBIN A1C
Hgb A1c MFr Bld: 6.1 % — ABNORMAL HIGH (ref 4.8–5.6)
Mean Plasma Glucose: 128.37 mg/dL

## 2018-02-06 LAB — TSH: TSH: 1.277 u[IU]/mL (ref 0.350–4.500)

## 2018-02-06 MED ORDER — ARIPIPRAZOLE 5 MG PO TABS
5.0000 mg | ORAL_TABLET | Freq: Every day | ORAL | Status: DC
  Administered 2018-02-07: 5 mg via ORAL
  Filled 2018-02-06 (×3): qty 1

## 2018-02-06 NOTE — Progress Notes (Signed)
The patient rated his day as a 3 out of a possible 10. He states that he enjoyed the groups and that he played basketball with his peers. His goal for tomorrow is to talk more with his peers and the staff.

## 2018-02-06 NOTE — Progress Notes (Signed)
Recreation Therapy Notes  Date: 4.8.19 Time: 9:30 a.m. Location: [address] Dayroom   Group Topic: Stress Management   Goal Area(s) Addresses:  Goal 1.1: To reduce stress  -Patient will feel a reduction in stress level  -Patient will learn the importance of stress management  -Patient will participate during stress management group      Intervention: Stress Management   Activity: Meditation- Patients were in a peaceful environment with soft lighting enhancing patients mood. Patients listened to a body scan meditation to help decrease tension and stress levels    Education: Stress Management, Discharge Planning.    Education Outcome: Acknowledges edcuation/In group clarification offered/Needs additional education   Clinical Observations/Feedback:: Patient did not attend     Sheryle HailDarian Cherryl Babin, Recreation Therapy Intern   Sheryle HailDarian Sachin Ferencz 02/06/2018 8:31 AM
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
## 2018-02-06 NOTE — [facility] Group Notes (Signed)
[facility] LCSW Group Therapy Note  Date/Time: 02/06/18, 1315  Type of Therapy and Topic:  Group Therapy:  Overcoming Obstacles  Participation Level:  moderate  Description of Group:    In this group patients will be encouraged to explore what they see as obstacles to their own wellness and recovery. They will be guided to discuss their thoughts, feelings, and behaviors related to these obstacles. The group will process together ways to cope with barriers, with attention given to specific choices patients can make. Each patient will be challenged to identify changes they are motivated to make in order to overcome their obstacles. This group will be process-oriented, with patients participating in exploration of their own experiences as well as giving and receiving support and challenge from other group members.  Therapeutic Goals: 1. Patient will identify personal and current obstacles as they relate to admission. 2. Patient will identify barriers that currently interfere with their wellness or overcoming obstacles.  3. Patient will identify feelings, thought process and behaviors related to these barriers. 4. Patient will identify two changes they are willing to make to overcome these obstacles:    Summary of Patient Progress: Pt shared that his family history in foster care and loneliness are obstacles in his life.  Pt did not speak much during group discussion but did answer CSW questions and was attentive throughout.      Therapeutic Modalities:   Cognitive Behavioral Therapy Solution Focused Therapy Motivational Interviewing Relapse Prevention Therapy  Daleen SquibbGreg Marilena Trevathan, LCSW

## 2018-02-06 NOTE — Progress Notes (Signed)
D    Pt is pleasant and cooperative    He reports improved mood and improved anxiety since being hospitalized   He interacts appropriately with others and his behavior is appropriate  A    Verbal support given   Medications administered and effectiveness monitored     Q 15 min checks R   Pt is safe at present

## 2018-02-06 NOTE — Tx Team (Signed)
Interdisciplinary Treatment and Diagnostic Plan Update  02/06/2018 Time of Session: 10:25am Jacob [name] MRN: [id number]  Principal Diagnosis: <principal problem not specified>  Secondary Diagnoses: Active Problems:   Severe recurrent major depression without psychotic features (HCC)   Current Medications:  Current Facility-Administered Medications  Medication Dose Route Frequency Provider Last Rate Last Dose  . acetaminophen (TYLENOL) tablet 650 mg  650 mg Oral Q6H PRN Lindon Romp A, NP      . alum & mag hydroxide-simeth (MAALOX/MYLANTA) 200-200-20 MG/5ML suspension 30 mL  30 mL Oral Q4H PRN Lindon Romp A, NP      . ARIPiprazole (ABILIFY) tablet 2 mg  2 mg Oral Daily Cobos, Myer Peer, MD   2 mg at 02/06/18 0734  . feeding supplement (ENSURE ENLIVE) (ENSURE ENLIVE) liquid 237 mL  237 mL Oral BID BM Money, Lowry Ram, FNP   237 mL at 02/06/18 0735  . hydrOXYzine (ATARAX/VISTARIL) tablet 25 mg  25 mg Oral Q6H PRN Cobos, Fernando A, MD      . magnesium hydroxide (MILK OF MAGNESIA) suspension 30 mL  30 mL Oral Daily PRN Lindon Romp A, NP      . sertraline (ZOLOFT) tablet 50 mg  50 mg Oral Daily Cobos, Myer Peer, MD   50 mg at 02/06/18 0734  . traZODone (DESYREL) tablet 50 mg  50 mg Oral QHS PRN Cobos, Myer Peer, MD       PTA Medications: Medications Prior to Admission  Medication Sig Dispense Refill Last Dose  . cyclobenzaprine (FLEXERIL) 10 MG tablet Take 1 tablet (10 mg total) by mouth 2 (two) times daily as needed for muscle spasms. (Patient not taking: Reported on 02/04/2018) 14 tablet 0 Completed Course at Unknown time  . ibuprofen (ADVIL,MOTRIN) 800 MG tablet Take 1 tablet (800 mg total) by mouth 3 (three) times daily. (Patient not taking: Reported on 02/04/2018) 21 tablet 0 Completed Course at Unknown time    Patient Stressors: Occupational concerns Other: increased anxiety and loneliness  Patient Strengths: Ability for insight Average or above average intelligence Communication  skills Motivation for treatment/growth Physical Health Supportive family/friends  Treatment Modalities: Medication Management, Group therapy, Case management,  1 to 1 session with clinician, Psychoeducation, Recreational therapy.   Physician Treatment Plan for Primary Diagnosis: <principal problem not specified> Long Term Goal(s): Improvement in symptoms so as ready for discharge Improvement in symptoms so as ready for discharge   Short Term Goals: Ability to identify changes in lifestyle to reduce recurrence of condition will improve Ability to identify and develop effective coping behaviors will improve Ability to identify triggers associated with substance abuse/mental health issues will improve  Medication Management: Evaluate patient's response, side effects, and tolerance of medication regimen.  Therapeutic Interventions: 1 to 1 sessions, Unit Group sessions and Medication administration.  Evaluation of Outcomes: Not Met  Physician Treatment Plan for Secondary Diagnosis: Active Problems:   Severe recurrent major depression without psychotic features ([place])  Long Term Goal(s): Improvement in symptoms so as ready for discharge Improvement in symptoms so as ready for discharge   Short Term Goals: Ability to identify changes in lifestyle to reduce recurrence of condition will improve Ability to identify and develop effective coping behaviors will improve Ability to identify triggers associated with substance abuse/mental health issues will improve     Medication Management: Evaluate patient's response, side effects, and tolerance of medication regimen.  Therapeutic Interventions: 1 to 1 sessions, Unit Group sessions and Medication administration.  Evaluation of Outcomes: Not Met  RN Treatment Plan for Primary Diagnosis: <principal problem not specified> Long Term Goal(s): Knowledge of disease and therapeutic regimen to maintain health will improve  Short Term Goals:  Ability to remain free from injury will improve, Ability to verbalize frustration and anger appropriately will improve, Ability to demonstrate self-control, Ability to participate in decision making will improve, Ability to verbalize feelings will improve, Ability to disclose and discuss suicidal ideas, Ability to identify and develop effective coping behaviors will improve and Compliance with prescribed medications will improve  Medication Management: RN will administer medications as ordered by provider, will assess and evaluate patient's response and provide education to patient for prescribed medication. RN will report any adverse and/or side effects to prescribing provider.  Therapeutic Interventions: 1 on 1 counseling sessions, Psychoeducation, Medication administration, Evaluate responses to treatment, Monitor vital signs and CBGs as ordered, Perform/monitor CIWA, COWS, AIMS and Fall Risk screenings as ordered, Perform wound care treatments as ordered.  Evaluation of Outcomes: Not Met   LCSW Treatment Plan for Primary Diagnosis: <principal problem not specified> Long Term Goal(s): Safe transition to appropriate next level of care at discharge, Engage patient in therapeutic group addressing interpersonal concerns.  Short Term Goals: Engage patient in aftercare planning with referrals and resources, Increase social support, Increase ability to appropriately verbalize feelings, Increase emotional regulation, Facilitate acceptance of mental health diagnosis and concerns, Facilitate patient progression through stages of change regarding substance use diagnoses and concerns, Identify triggers associated with mental health/substance abuse issues and Increase skills for wellness and recovery  Therapeutic Interventions: Assess for all discharge needs, 1 to 1 time with Social worker, Explore available resources and support systems, Assess for adequacy in community support network, Educate family and  significant other(s) on suicide prevention, Complete Psychosocial Assessment, Interpersonal group therapy.  Evaluation of Outcomes: Not Met   Progress in Treatment: Attending groups: Yes. Participating in groups: Yes. Minimally  Taking medication as prescribed: Yes. Toleration medication: Yes. Family/Significant other contact made: No, will contact:  the patient's mother, Awanda Mink  Patient understands diagnosis: Yes. Discussing patient identified problems/goals with staff: Yes. Medical problems stabilized or resolved: Yes. Denies suicidal/homicidal ideation: Yes. Issues/concerns per patient self-inventory: No. Other:   New problem(s) identified: None  New Short Term/Long Term Goal(s): medication stabilization, elimination of SI thoughts, development of comprehensive mental wellness plan.   Patient Goals: "I want to be confident and figure out how to cope with my anxiety and depression"  Discharge Plan or Barriers: CSW will continue to assess for an appropriate discharge plan.   Reason for Continuation of Hospitalization: Anxiety Depression Medication stabilization  Estimated Length of Stay: Friday, 02/10/18  Attendees: Patient: Jacob [name] 02/06/2018 8:29 AM  Physician: Dr. Neita Garnet 02/06/2018 8:29 AM  Nursing: Tamela Oddi, RN  02/06/2018 8:29 AM  RN Care Manager: Rhunette Croft 02/06/2018 8:29 AM  Social Worker: Radonna Ricker, Chauncey 02/06/2018 8:29 AM  Recreational Therapist: Rhunette Croft 02/06/2018 8:29 AM  Other:  02/06/2018 8:29 AM  Other:  02/06/2018 8:29 AM  Other: 02/06/2018 8:29 AM    Scribe for Treatment Team: Marylee Floras, [place] 02/06/2018 8:29 AM

## 2018-02-06 NOTE — Progress Notes (Signed)
Patient ID: Jacob [name], male   DOB: 20-Jan-1991, 27 y.o.   MRN: [id number]  D: Patient denies SI/HI/AVH, reports that his sleep quality last night was good, reports a good appetite, reports his energy level as normal, and his concentration level as good.  Pt rates his depression today as 3 (10 being the highest level), reports his hopelessness level as 2 (10 being the highest level), rates his anxiety level as 3 (10 being the highest level).  Pt denies being in any physical pain today, and reported earlier in shift that his goal was to go to group sessions.  A: Pt given all meds as scheduled, and maintained on Q15 minute checks for safety.    R: Pt denies any current concerns, will continue to monitor.

## 2018-02-06 NOTE — Progress Notes (Signed)
[facility] MD Progress Note  02/06/2018 12:41 PM Jacob [name]  MRN:  [id number] Subjective: States " I am doing OK". Denies medication side effects. Denies suicidal ideations. Objective: I have reviewed chart notes, discussed case with staff, met with patient. 27 year old single male who presented voluntarily due to suicidal ideations and some neurovegetative symptoms of depression.  Patient presents superficial, pleasant, cooperative. Patient presents with a somewhat tangential thought process, and reports self referential ideations.States that at work he hears certain coworkers using demeaning words ( hears them using word "lil dick")  , and describes that although they  are not directly talking to him and he is not part of their conversation, he feels these insults are often directed towards him. He also reports he thinks people have tampered with his lunch bag at work, and states he ties the bag knot in a certain way so that he can tell when someone has been tampering with it.  Denies hallucinations, and does not present internally preoccupied at present. Denies medication side effects.  Labs reviewed as below- HgbA1C 6.1 ( pre diabetes range)    Principal Problem: Depression, Unspecified Diagnosis:   Patient Active Problem List   Diagnosis Date Noted  . Severe recurrent major depression without psychotic features ([place]) [F33.2] 02/04/2018   Total Time spent with patient: 20 minutes Past Medical History: History reviewed. No pertinent past medical history. History reviewed. No pertinent surgical history. Family History: History reviewed. No pertinent family history.  Social History:  Social History   Substance and Sexual Activity  Alcohol Use No     Social History   Substance and Sexual Activity  Drug Use Not on file    Social History   Socioeconomic History  . Marital status: Single    Spouse name: Not on file  . Number of children: Not on file  . Years of education: Not on  file  . Highest education level: Not on file  Occupational History  . Not on file  Social Needs  . Financial resource strain: Not on file  . Food insecurity:    Worry: Not on file    Inability: Not on file  . Transportation needs:    Medical: Not on file    Non-medical: Not on file  Tobacco Use  . Smoking status: Never Smoker  . Smokeless tobacco: Never Used  Substance and Sexual Activity  . Alcohol use: No  . Drug use: Not on file  . Sexual activity: Not on file  Lifestyle  . Physical activity:    Days per week: Not on file    Minutes per session: Not on file  . Stress: Not on file  Relationships  . Social connections:    Talks on phone: Not on file    Gets together: Not on file    Attends religious service: Not on file    Active member of club or organization: Not on file    Attends meetings of clubs or organizations: Not on file    Relationship status: Not on file  Other Topics Concern  . Not on file  Social History Narrative  . Not on file   Additional Social History:   Sleep: improving   Appetite:  Good  Current Medications: Current Facility-Administered Medications  Medication Dose Route Frequency Provider Last Rate Last Dose  . acetaminophen (TYLENOL) tablet 650 mg  650 mg Oral Q6H PRN Lindon Romp A, NP      . alum & mag hydroxide-simeth (MAALOX/MYLANTA) 200-200-20 MG/5ML suspension 30  mL  30 mL Oral Q4H PRN Rozetta Nunnery, NP      . Derrill Memo ON 02/07/2018] ARIPiprazole (ABILIFY) tablet 5 mg  5 mg Oral Daily Omario Ander A, MD      . feeding supplement (ENSURE ENLIVE) (ENSURE ENLIVE) liquid 237 mL  237 mL Oral BID BM Money, Lowry Ram, FNP   237 mL at 02/06/18 0735  . hydrOXYzine (ATARAX/VISTARIL) tablet 25 mg  25 mg Oral Q6H PRN Levin Dagostino, Myer Peer, MD      . magnesium hydroxide (MILK OF MAGNESIA) suspension 30 mL  30 mL Oral Daily PRN Lindon Romp A, NP      . sertraline (ZOLOFT) tablet 50 mg  50 mg Oral Daily Alexanderia Gorby, Myer Peer, MD   50 mg at 02/06/18 0734  .  traZODone (DESYREL) tablet 50 mg  50 mg Oral QHS PRN Thiago Ragsdale, Myer Peer, MD        Lab Results:  Results for orders placed or performed during the hospital encounter of 02/04/18 (from the past 48 hour(s))  Hemoglobin A1c     Status: Abnormal   Collection Time: 02/06/18  6:16 AM  Result Value Ref Range   Hgb A1c MFr Bld 6.1 (H) 4.8 - 5.6 %    Comment: (NOTE) Pre diabetes:          5.7%-6.4% Diabetes:              >6.4% Glycemic control for   <7.0% adults with diabetes    Mean Plasma Glucose 128.37 mg/dL    Comment: Performed at [facility], [place] [address] [place], [place] [address]  Lipid panel     Status: Abnormal   Collection Time: 02/06/18  6:16 AM  Result Value Ref Range   Cholesterol 204 (H) 0 - 200 mg/dL   Triglycerides 43 <150 mg/dL   HDL 81 >40 mg/dL   Total CHOL/HDL Ratio 2.5 RATIO   VLDL 9 0 - 40 mg/dL   LDL Cholesterol 114 (H) 0 - 99 mg/dL    Comment:        Total Cholesterol/HDL:CHD Risk Coronary Heart Disease Risk Table                     Men   Women  1/2 Average Risk   3.4   3.3  Average Risk       5.0   4.4  2 X Average Risk   9.6   7.1  3 X Average Risk  23.4   11.0        Use the calculated Patient Ratio above and the CHD Risk Table to determine the patient's CHD Risk.        ATP III CLASSIFICATION (LDL):  <100     mg/dL   Optimal  100-129  mg/dL   Near or Above                    Optimal  130-159  mg/dL   Borderline  160-189  mg/dL   High  >190     mg/dL   Very High Performed at [facility] [address], [place], [place] [address]   TSH     Status: None   Collection Time: 02/06/18  6:16 AM  Result Value Ref Range   TSH 1.277 0.350 - 4.500 uIU/mL    Comment: Performed by a 3rd Generation assay with a functional sensitivity of <=0.01 uIU/mL. Performed at [facility], [address] [place], [place] [address]     Blood Alcohol level:  Lab Results  Component Value Date   ETH <10 71/24/5809     Metabolic Disorder Labs: Lab Results  Component Value Date   HGBA1C 6.1 (H) 02/06/2018   MPG 128.37 02/06/2018   No results found for: PROLACTIN Lab Results  Component Value Date   CHOL 204 (H) 02/06/2018   TRIG 43 02/06/2018   HDL 81 02/06/2018   CHOLHDL 2.5 02/06/2018   VLDL 9 02/06/2018   LDLCALC 114 (H) 02/06/2018    Physical Findings: AIMS: Facial and Oral Movements Muscles of Facial Expression: None, normal Lips and Perioral Area: None, normal Jaw: None, normal Tongue: None, normal,Extremity Movements Upper (arms, wrists, hands, fingers): None, normal Lower (legs, knees, ankles, toes): None, normal, Trunk Movements Neck, shoulders, hips: None, normal, Overall Severity Severity of abnormal movements (highest score from questions above): None, normal Incapacitation due to abnormal movements: None, normal Patient's awareness of abnormal movements (rate only patient's report): No Awareness, Dental Status Current problems with teeth and/or dentures?: No Does patient usually wear dentures?: No  CIWA:    COWS:     Musculoskeletal: Strength & Muscle Tone: within normal limits Gait & Station: normal Patient leans: N/A  Psychiatric Specialty Exam: Physical Exam  ROS denies headache, denies chest pain, denies dyspnea, no vomiting  Blood pressure (!) 142/83, pulse 96, temperature 98.6 F (37 C), temperature source Oral, resp. rate 20, height 5' 7.5" (1.715 m), weight 59.6 kg (131 lb 6.4 oz), SpO2 99 %.Body mass index is 20.28 kg/m.  General Appearance: Well Groomed  Eye Contact:  Good  Speech:  Normal Rate  Volume:  Normal  Mood:  denies depression at this time  Affect:  reactive   Thought Process: tangentiality/thought disorder  noted with open ended questions   Orientation:  Other:  Fully alert and attentive  Thought Content:  Denies hallucinations and does not appear internally preoccupied, no delusions expressed, consider self-referential ideations  Suicidal  Thoughts:  No denies any suicidal ideations, contracts for safety on unit, denies any homicidal or violent ideations towards anybody. Denies homicidal ideations towards people at work- see above   Homicidal Thoughts:  No  Memory:  Recent and remote grossly intact  Judgement:  Fair  Insight:  Fair  Psychomotor Activity:  Normal  Concentration:  Concentration: Good and Attention Span: Good  Recall:  Good  Fund of Knowledge:  Good  Language:  Good  Akathisia:  Negative  Handed:  Right  AIMS (if indicated):     Assets:  Communication Skills Desire for Improvement Resilience  ADL's:  Intact  Cognition:  WNL  Sleep:  Number of Hours: 6.75   Assessment -patient somewhat vague, superficial but calm, cooperative, and pleasant on approach, he minimizes depression and presents with a reactive affect . Denies SI. Presents with some thought disorder and expresses self referential ideations, but denies hallucinations and does not appear internally preoccupied . Denies medication side effects.     Treatment Plan Summary: Daily contact with patient to assess and evaluate symptoms and progress in treatment, Medication management, Plan inpatient treatment  and medications as below  Treatment Plan reviewed as below today 4/8 Encourage group and milieu participation to work on coping skills and symptom reduction. Continue Zoloft at 50 mg a day for depression and anxiety. Increase Abilify to 5  mg daily for antidepressant augmentation and paranoid ideations  Continue Tazodone 50 mg nightly as needed for insomnia Continue Hydroxyzine 25  mg every 6 hours as needed for anxiety. Will request dietary consult for education and recommendations regarding prediabetic HgbA1C range .  Treatment Team working on disposition plan and working on  obtaining collateral information from family   Jenne Campus, MD 02/06/2018, 12:41 PM   Patient ID: Jacob [name], male   DOB: 12-06-1990, 27 y.o.   MRN: [id number]

## 2018-02-07 DIAGNOSIS — F333 Major depressive disorder, recurrent, severe with psychotic symptoms: Secondary | ICD-10-CM

## 2018-02-07 MED ORDER — ARIPIPRAZOLE 5 MG PO TABS
5.0000 mg | ORAL_TABLET | Freq: Once | ORAL | Status: AC
Start: 2018-02-07 — End: 2018-02-07
  Administered 2018-02-07: 5 mg via ORAL
  Filled 2018-02-07: qty 1

## 2018-02-07 MED ORDER — ARIPIPRAZOLE 10 MG PO TABS
10.0000 mg | ORAL_TABLET | Freq: Every day | ORAL | Status: DC
  Administered 2018-02-08: 10 mg via ORAL
  Filled 2018-02-07 (×3): qty 1

## 2018-02-07 NOTE — [facility] Group Notes (Signed)
  [facility] LCSW Group Therapy Note  Date/Time: 02/07/18, 1315  Type of Therapy/Topic:  Group Therapy:  Emotion Regulation  Participation Level:  Minimal   Mood: pleasant  Description of Group:    The purpose of this group is to assist patients in learning to regulate negative emotions and experience positive emotions. Patients will be guided to discuss ways in which they have been vulnerable to their negative emotions. These vulnerabilities will be juxtaposed with experiences of positive emotions or situations, and patients challenged to use positive emotions to combat negative ones. Special emphasis will be placed on coping with negative emotions in conflict situations, and patients will process healthy conflict resolution skills.  Therapeutic Goals: 1. Patient will identify two positive emotions or experiences to reflect on in order to balance out negative emotions:  2. Patient will label two or more emotions that they find the most difficult to experience:  3. Patient will be able to demonstrate positive conflict resolution skills through discussion or role plays:   Summary of Patient Progress: Pt identified "agitated" as an emotion that is difficult for him to experience.  Pt did not appear to be understanding the group topic based on some of his comments and continues to laugh somewhat inappropriately at times.  Pt slept for the last part of group.       Therapeutic Modalities:   Cognitive Behavioral Therapy Feelings Identification Dialectical Behavioral Therapy  Daleen SquibbGreg Carrigan Delafuente, LCSW

## 2018-02-07 NOTE — Progress Notes (Signed)
D: Patient observed up and visible in the milieu at times, interactive with peers. At other times observed resting in bed and startles upon awakening by this Clinical research associatewriter. Patient states he has no complaints however forwards minimal information. Patient's affect flat, mood depressed. Per self inventory and discussions with writer, rates depression at a 2/10, hopelessness at a 2/10 and anxiety at a 3/10. Rates sleep as good, appetite as good, energy as normal and concentration as good.  States goal for today is "talking." Denies pain, physical complaints. Drinking Ensures and states appetite is improving.  A: Medicated per orders, no prns requested or required. Abilify increased and patient made aware. Level III obs in place for safety. Emotional support offered and self inventory reviewed. Encouraged completion of Suicide Safety Plan and programming participation. Discussed POC with MD, SW.   R: Patient verbalizes understanding of POC. Patient denies SI/HI/AVH and remains safe on level III obs. Will continue to monitor closely and make verbal contact frequently.

## 2018-02-07 NOTE — [facility] Group Notes (Signed)
Pt was invited but did not attend orientation/goals group. 

## 2018-02-07 NOTE — Progress Notes (Signed)
Psychoeducational Group Note  Date:  02/07/2018 Time:  2130  Group Topic/Focus:  Wrap-Up Group:   The focus of this group is to help patients review their daily goal of treatment and discuss progress on daily workbooks.  Participation Level: Did Not Attend  Participation Quality:  Not Applicable  Affect:  Not Applicable  Cognitive:  Not Applicable  Insight:  Not Applicable  Engagement in Group: Not Applicable  Additional Comments:  The patient did not attend group since he was asleep.   Verenise Moulin S 02/07/2018, 9:30 PM

## 2018-02-07 NOTE — [facility] Group Notes (Signed)
Pt participated in recreation activity outside with other peers.

## 2018-02-07 NOTE — [facility] Group Notes (Signed)
[facility] Group Notes:  (Nursing/MHT/Case Management/Adjunct)  Date:  02/07/2018  Time:  3:45 pm  Type of Therapy:  Nurse Education  Participation Level:  Did Not Attend  Participation Quality:    Affect:    Cognitive:    Insight:    Engagement in Group:    Modes of Intervention:    Summary of Progress/Problems:  Earline MayotteKnight, Cornelious Diven Shephard 02/07/2018, 5:54 PM

## 2018-02-07 NOTE — Progress Notes (Signed)
Recreation Therapy Notes   Animal-Assisted Activity (AAA) Program Checklist/Progress Notes Patient Eligibility Criteria Checklist & Daily Group note for Rec Tx Intervention Date: 4.9.19. Time: 2:45 p.m.  [facility]   AAA/T Program Assumption of Risk Form signed by Patient/ or Parent Legal Guardian Yes  Patient is free of allergies or sever asthma Yes  Patient reports no fear of animals Yes  Patient reports no history of cruelty to animals Yes  Patient understands his/her participation is voluntary Yes  Patient washes hands before animal contact Yes  Patient washes hands after animal contact Yes  Education: Hand Washing, Appropriate Animal Interaction   Education Outcome: Acknowledges Education.   Clinical Observations/Feedback: Patient did not attend   Sheryle Hailarian Azaliah Carrero, Recreation Therapy Intern   Sheryle HailDarian Haylee Mcanany 02/07/2018 1:59 PM

## 2018-02-07 NOTE — Progress Notes (Signed)
D   Pt isolated to his room tonight and had limited interaction with others    He is calm and cooperative but is not forthcoming with information   He did say he didn't need anything and was hoping he would be discharged tomorrow  A    Verbal support given   Medications offered    Q 15 min checks R   Pt remains safe

## 2018-02-07 NOTE — Progress Notes (Signed)
[facility]  02/07/2018 1:19 PM Jacob [name]  MRN:  [id number] Subjective: Patient is seen and examined.  Patient is a 27 year old male with a past psychiatric history significant for major depression, severe with psychotic features.  He is seen in follow-up.  He stated he feels well.  He is doing better.  He had some irritability with another patient, but he is able to cope with that better.  He clearly defines that he was having auditory hallucinations at his workplace.  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  They would say bad things about him.  Review of the chart shows that Dr. Jama Flavorsobos noted that they were saying demeaning words "hears them using the word "little dick".  We also discussed signing the release of information so we can get some collateral information from his family members.  He denied any auditory, visual or tactile hallucinations.  He denied any suicidal or homicidal ideation.  He denied any side effects to his current medications. Principal Problem: <principal problem not specified> Diagnosis:   Patient Active Problem List   Diagnosis Date Noted  . Severe recurrent major depression without psychotic features (HCC) [F33.2] 02/04/2018   Total Time spent with patient: 30 minutes  Past Psychiatric History: See admission H&P  Past Medical History: History reviewed. No pertinent past medical history. History reviewed. No pertinent surgical history. Family History: History reviewed. No pertinent family history. Family Psychiatric  History: See admission H&P Social History:  Social History   Substance and Sexual Activity  Alcohol Use No     Social History   Substance and Sexual Activity  Drug Use Not on file    Social History   Socioeconomic History  . Marital status: Single    Spouse name: Not on file  . Number of children: Not on file  . Years of education: Not on file  . Highest education level: Not on file  Occupational History  . Not on file   Social Needs  . Financial resource strain: Not on file  . Food insecurity:    Worry: Not on file    Inability: Not on file  . Transportation needs:    Medical: Not on file    Non-medical: Not on file  Tobacco Use  . Smoking status: Never Smoker  . Smokeless tobacco: Never Used  Substance and Sexual Activity  . Alcohol use: No  . Drug use: Not on file  . Sexual activity: Not on file  Lifestyle  . Physical activity:    Days per week: Not on file    Minutes per session: Not on file  . Stress: Not on file  Relationships  . Social connections:    Talks on phone: Not on file    Gets together: Not on file    Attends religious service: Not on file    Active member of club or organization: Not on file    Attends meetings of clubs or organizations: Not on file    Relationship status: Not on file  Other Topics Concern  . Not on file  Social History Narrative  . Not on file   Additional Social History:                         Sleep: Fair  Appetite:  Good  Current Medications: Current Facility-Administered Medications  Medication Dose Route Frequency Provider Last Rate Last Dose  . acetaminophen (TYLENOL) tablet 650 mg  650 mg Oral  Q6H PRN Jackelyn Poling, NP      . alum & mag hydroxide-simeth (MAALOX/MYLANTA) 200-200-20 MG/5ML suspension 30 mL  30 mL Oral Q4H PRN Jackelyn Poling, NP      . Melene Muller ON 02/08/2018] ARIPiprazole (ABILIFY) tablet 10 mg  10 mg Oral Daily Antonieta Pert, MD      . ARIPiprazole (ABILIFY) tablet 5 mg  5 mg Oral Once Antonieta Pert, MD      . feeding supplement (ENSURE ENLIVE) (ENSURE ENLIVE) liquid 237 mL  237 mL Oral BID BM Money, Gerlene Burdock, FNP   237 mL at 02/06/18 1914  . hydrOXYzine (ATARAX/VISTARIL) tablet 25 mg  25 mg Oral Q6H PRN Cobos, Rockey Situ, MD      . magnesium hydroxide (MILK OF MAGNESIA) suspension 30 mL  30 mL Oral Daily PRN Nira Conn A, NP      . sertraline (ZOLOFT) tablet 50 mg  50 mg Oral Daily Cobos, Rockey Situ, MD    50 mg at 02/07/18 0801  . traZODone (DESYREL) tablet 50 mg  50 mg Oral QHS PRN Cobos, Rockey Situ, MD        Lab Results:  Results for orders placed or performed during the hospital encounter of 02/04/18 (from the past 48 hour(s))  Hemoglobin A1c     Status: Abnormal   Collection Time: 02/06/18  6:16 AM  Result Value Ref Range   Hgb A1c MFr Bld 6.1 (H) 4.8 - 5.6 %    Comment: (NOTE) Pre diabetes:          5.7%-6.4% Diabetes:              >6.4% Glycemic control for   <7.0% adults with diabetes    Mean Plasma Glucose 128.37 mg/dL    Comment: Performed at [facility], [address], [place], [place] [address]  Lipid panel     Status: Abnormal   Collection Time: 02/06/18  6:16 AM  Result Value Ref Range   Cholesterol 204 (H) 0 - 200 mg/dL   Triglycerides 43 <621 mg/dL   HDL 81 >30 mg/dL   Total CHOL/HDL Ratio 2.5 RATIO   VLDL 9 0 - 40 mg/dL   LDL Cholesterol 865 (H) 0 - 99 mg/dL    Comment:        Total Cholesterol/HDL:CHD Risk Coronary Heart Disease Risk Table                     Men   Women  1/2 Average Risk   3.4   3.3  Average Risk       5.0   4.4  2 X Average Risk   9.6   7.1  3 X Average Risk  23.4   11.0        Use the calculated Patient Ratio above and the CHD Risk Table to determine the patient's CHD Risk.        ATP III CLASSIFICATION (LDL):  <100     mg/dL   Optimal  784-696  mg/dL   Near or Above                    Optimal  130-159  mg/dL   Borderline  295-284  mg/dL   High  >132     mg/dL   Very High Performed at [facility], [address], [place], [place] [address]   TSH     Status: None   Collection Time:  02/06/18  6:16 AM  Result Value Ref Range   TSH 1.277 0.350 - 4.500 uIU/mL    Comment: Performed by a 3rd Generation assay with a functional sensitivity of <=0.01 uIU/mL. Performed at [facility], [address] [place], [place] [address]     Blood Alcohol level:  Lab Results  Component Value  Date   ETH <10 02/03/2018    Metabolic Disorder Labs: Lab Results  Component Value Date   HGBA1C 6.1 (H) 02/06/2018   MPG 128.37 02/06/2018   No results found for: PROLACTIN Lab Results  Component Value Date   CHOL 204 (H) 02/06/2018   TRIG 43 02/06/2018   HDL 81 02/06/2018   CHOLHDL 2.5 02/06/2018   VLDL 9 02/06/2018   LDLCALC 114 (H) 02/06/2018    Physical Findings: AIMS: Facial and Oral Movements Muscles of Facial Expression: None, normal Lips and Perioral Area: None, normal Jaw: None, normal Tongue: None, normal,Extremity Movements Upper (arms, wrists, hands, fingers): None, normal Lower (legs, knees, ankles, toes): None, normal, Trunk Movements Neck, shoulders, hips: None, normal, Overall Severity Severity of abnormal movements (highest score from questions above): None, normal Incapacitation due to abnormal movements: None, normal Patient's awareness of abnormal movements (rate only patient's report): No Awareness, Dental Status Current problems with teeth and/or dentures?: No Does patient usually wear dentures?: No  CIWA:    COWS:     Musculoskeletal: Strength & Muscle Tone: within normal limits Gait & Station: normal Patient leans: N/A  Psychiatric Specialty Exam: Physical Exam  Constitutional: He is oriented to person, place, and time. He appears well-developed and well-nourished.  Respiratory: Effort normal.  Musculoskeletal: Normal range of motion.  Neurological: He is oriented to person, place, and time.    Review of Systems  All other systems reviewed and are negative.   Blood pressure 130/88, pulse 74, temperature 98.9 F (37.2 C), temperature source Oral, resp. rate 16, height 5' 7.5" (1.715 m), weight 59.6 kg (131 lb 6.4 oz), SpO2 99 %.Body mass index is 20.28 kg/m.  General Appearance: Casual  Eye Contact:  Good  Speech:  Clear and Coherent  Volume:  Normal  Mood:  Anxious  Affect:  Appropriate  Thought Process:  Coherent  Orientation:   Full (Time, Place, and Person)  Thought Content:  Hallucinations: Auditory  Suicidal Thoughts:  No  Homicidal Thoughts:  No  Memory:  Immediate;   Good  Judgement:  Fair  Insight:  Fair  Psychomotor Activity:  Normal  Concentration:  Concentration: Fair  Recall:  [employer] of Knowledge:  Fair  Language:  Fair  Akathisia:  Negative  Handed:  Right  AIMS (if indicated):     Assets:  Communication Skills Desire for Improvement Housing Social Support  ADL's:  Intact  Cognition:  WNL  Sleep:  Number of Hours: 6.75     Treatment Plan Summary: Daily contact with patient to assess and evaluate symptoms and progress in treatment, Medication management and Plan Patient is seen and examined.  Patient is a 27 year old male with the above-stated past psychiatric history seen in follow-up.  He appears to be improved from admission, but still having some paranoid thoughts as well as preoccupation with previous auditory hallucinations.  We discussed this today, and I am going to raise his Abilify to 10 mg p.o. daily.  I am going to give him 5 mg at bedtime tonight, and he will start 10 mg a day starting tomorrow a.m.  He also has yet to sign  a release of information, and he is agreed to do that today.  I told him to go the nurses station so we can contact the family with which he lives with.  His previous hemoglobin A1c was 6.1 in the prediabetic range, and that will have to be monitored.  Otherwise no other changes in his antidepressant medication.  Hopefully he will continue to slowly improve.  Antonieta Pert, MD 02/07/2018, 1:19 PM

## 2018-02-07 NOTE — Plan of Care (Signed)
Patient has not engaged in aggressive behavior, either physical or verbal, and his behavior has been cooperative.

## 2018-02-08 MED ORDER — ARIPIPRAZOLE 10 MG PO TABS
10.0000 mg | ORAL_TABLET | Freq: Every day | ORAL | Status: DC
  Administered 2018-02-08 – 2018-02-09 (×2): 10 mg via ORAL
  Filled 2018-02-08 (×4): qty 1

## 2018-02-08 NOTE — [facility] Group Notes (Signed)
[facility] Group Notes:  (Nursing/MHT/Case Management/Adjunct)  Date:  02/08/2018  Time:  1330  Type of Therapy:  Nurse Education  Participation Level:  Did Not Attend  Participation Quality:  did not attend  Affect:  did not attend  Cognitive:  did not attend  Insight:  None  Engagement in Group:  None  Modes of Intervention:  Discussion, Exploration and Support  Summary of Progress/Problems: pt did not attend group  Raylene MiyamotoMichael R Aime Meloche 02/08/2018, 4:06 PM

## 2018-02-08 NOTE — Progress Notes (Signed)
Recreation Therapy Notes  Date: 4.10.19 Time: 9:30 a.m. Location: [address] Dayroom   Group Topic: Stress Management   Goal Area(s) Addresses:  Goal 1.1: To reduce stress  -Patient will feel a reduction in stress level  -Patient will learn the importance of stress management  -Patient will participate during stress management group      Intervention: Stress Management  Activity: Meditation- Patients were in a peaceful environment with soft lighting enhancing patients mood. Patients listened to a deep concentration meditation to help decrease stress levels   Education: Stress Management, Discharge Planning.    Education Outcome: Acknowledges edcuation/In group clarification offered/Needs additional education   Clinical Observations/Feedback:: Patient did not attend     Sheryle HailDarian Greydis Stlouis, Recreation Therapy Intern   Sheryle HailDarian Trayce Maino 02/08/2018 8:39 AM

## 2018-02-08 NOTE — [facility] Group Notes (Signed)
[facility] Mental Health Association Group Therapy  02/08/2018  ? Type of Therapy: Mental Health Association Presentation ? Participation Level: Active ? Participation Quality: Attentive ? Affect: Appropriate ? Cognitive: Oriented ? Insight: Developing/Improving ? Engagement in Therapy: Engaged ? Modes of Intervention: Discussion, Education and Socialization ? Summary of Progress/Problems: Mental Health Association (MHA) Speaker came to talk about his personal journey with mental health. The pt processed ways by which to relate to the speaker. MHA speaker provided handouts and educational information pertaining to groups and services offered by the MHA. Pt was engaged in speaker's presentation and was receptive to resources provided.                [facility] Mental Health Association Group Therapy  02/08/2018  ? Type of Therapy: Mental Health Association Presentation ? Participation Level: Active ? Participation Quality: Attentive ? Affect: Appropriate ? Cognitive: Oriented ? Insight: Developing/Improving ? Engagement in Therapy: Engaged ? Modes of Intervention: Discussion, Education and Socialization ? Summary of Progress/Problems: Mental Health Association [facility]) Speaker came to talk about his personal journey with mental health. The pt processed ways by which to relate to the speaker. MHA speaker provided handouts and educational information pertaining to groups and services offered by the [facility]. Pt was engaged in speaker's presentation and was receptive to resources provided.

## 2018-02-08 NOTE — Plan of Care (Signed)
Patient reports only attending "some" programming. Patient has not engaged in self harm, denies SI, thoughts to do so.

## 2018-02-08 NOTE — Progress Notes (Signed)
Adult Psychoeducational Group Note  Date:  02/08/2018 Time:  8:38 PM  Group Topic/Focus:  Wrap-Up Group:   The focus of this group is to help patients review their daily goal of treatment and discuss progress on daily workbooks.  Participation Level:  Active  Participation Quality:  Appropriate  Affect:  Appropriate  Cognitive:  Alert and Oriented  Insight: Good  Engagement in Group:  Engaged  Modes of Intervention:  Discussion  Additional Comments:  Pt attended group this evening. Pt rated his day a 9/10.  Leo GrosserMegan A Kaliyan Osbourn 02/08/2018, 8:38 PM

## 2018-02-08 NOTE — Progress Notes (Signed)
D: Patient observed resting in bed, less visible today and has attended some groups. Patient forwards minimal information, remains guarded and somewhat paranoid . Patient's affect flat, mood depressed.  Denies pain, physical complaints.   A: Medicated per orders, no prns requested or required. Level III obs in place for safety. Emotional support offered and self inventory completion encouraged. Encouraged completion of Suicide Safety Plan as well. Spoke with patient about programming participation and reminded him it is part of his treatment plan. Discussed POC with MD, SW.   R: Patient verbalizes understanding of POC. Patient denies SI/HI/AVH and remains safe on level III obs. Will continue to monitor closely and make verbal contact frequently.

## 2018-02-08 NOTE — Progress Notes (Signed)
Nutrition Note  Consult for pre-diabetes education received. Per protocol, RN to provide pt with copy of packet outlining general healthy eating .  No further nutrition intervention warranted at this time. Please re-consult if further nutrition-related issues arise.  Tilda FrancoLindsey Alesandro Stueve, MS, RD, LDN [facility] Clinical Dietitian Pager: [phone] After Hours Pager: [phone]

## 2018-02-08 NOTE — Progress Notes (Signed)
D   Pt in the dayroom this evening interacting with others   His behavior is appropriate and he is compliant with treatment   Pt took his abilify tonight before he went to bed after he was told that the doctor changed the administration time  A   Verbal support given   Medications administered and effectiveness monitored    Q 15 min checks R   Pt is safe at present time

## 2018-02-08 NOTE — [facility] Group Notes (Signed)
[facility] Group Notes:  (Nursing/MHT/Case Management/Adjunct)  Date:  02/08/2018  Time:  1330  Type of Therapy:  Nurse Education - Promoting Self Esteem Through Positive Self Talk  Participation Level:  Did Not Attend  Participation Quality:    Affect:    Cognitive:    Insight:    Engagement in Group:    Modes of Intervention:    Summary of Progress/Problems: Patient invited however elected to remain in bed.  Merian CapronFriedman, Fadumo Heng [facility] CenterEakes 02/08/2018, 1400

## 2018-02-08 NOTE — Progress Notes (Signed)
[facility] MD Progress Note  02/08/2018 2:49 PM Jacob [name]  MRN:  [id number] Subjective: Patient is seen and examined.  Patient is a 27 year old male with a past psychiatric history significant for major depression, severe with psychotic features.  He is seen in follow-up.  He is a little sedated today.  He stated he thought that the increase Abilify during the day is making him sleepy.  We discussed changing that to nighttime.  He did state that he did not hear any derogatory statements about him today.  He stated he went outside and played basketball and had no problems.  He stated he feels more relaxed.  He denied any other side effects to medications.  He denied any suicidal ideation. Principal Problem: <principal problem not specified> Diagnosis:   Patient Active Problem List   Diagnosis Date Noted  . 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 Severe recurrent major depression without psychotic features (HCC) [F33.2] 02/04/2018   Total Time spent with patient: 20 minutes  Past Psychiatric History: See admission H&P  Past Medical History: History reviewed. No pertinent past medical history. History reviewed. No pertinent surgical history. Family History: History reviewed. No pertinent family history. Family Psychiatric  History: See admission H&P Social History:  Social History   Substance and Sexual Activity  Alcohol Use No     Social History   Substance and Sexual Activity  Drug Use Not on file    Social History   Socioeconomic History  . Marital status: Single    Spouse name: Not on file  . Number of children: Not on file  . Years of education: Not on file  . Highest education level: Not on file  Occupational History  . Not on file  Social Needs  . Financial resource strain: Not on file  . Food insecurity:    Worry: Not on file    Inability: Not on file  . Transportation needs:    Medical: Not on file    Non-medical: Not on file   Tobacco Use  . Smoking status: Never Smoker  . Smokeless tobacco: Never Used  Substance and Sexual Activity  . Alcohol use: No  . Drug use: Not on file  . Sexual activity: Not on file  Lifestyle  . Physical activity:    Days per week: Not on file    Minutes per session: Not on file  . Stress: Not on file  Relationships  . Social connections:    Talks on phone: Not on file    Gets together: Not on file    Attends religious service: Not on file    Active member of club or organization: Not on file    Attends meetings of clubs or organizations: Not on file    Relationship status: Not on file  Other Topics Concern  . Not on file  Social History Narrative  . Not on file   Additional Social History:                         Sleep: Good  Appetite:  Good  Current Medications: Current Facility-Administered Medications  Medication Dose Route Frequency Provider Last Rate Last Dose  . acetaminophen (TYLENOL) tablet 650 mg  650 mg Oral Q6H PRN Nira Conn A, NP      . alum & mag hydroxide-simeth (MAALOX/MYLANTA) 200-200-20 MG/5ML suspension 30 mL  30 mL Oral Q4H PRN Jackelyn Poling, NP      .  ARIPiprazole (ABILIFY) tablet 10 mg  10 mg Oral QHS Antonieta Pert, MD      . feeding supplement (ENSURE ENLIVE) (ENSURE ENLIVE) liquid 237 mL  237 mL Oral BID BM Money, Gerlene Burdock, FNP   237 mL at 02/07/18 1412  . hydrOXYzine (ATARAX/VISTARIL) tablet 25 mg  25 mg Oral Q6H PRN Cobos, Rockey Situ, MD      . magnesium hydroxide (MILK OF MAGNESIA) suspension 30 mL  30 mL Oral Daily PRN Nira Conn A, NP      . sertraline (ZOLOFT) tablet 50 mg  50 mg Oral Daily Cobos, Rockey Situ, MD   50 mg at 02/08/18 0755  . traZODone (DESYREL) tablet 50 mg  50 mg Oral QHS PRN Cobos, Rockey Situ, MD        Lab Results: No results found for this or any previous visit (from the past 48 hour(s)).  Blood Alcohol level:  Lab Results  Component Value Date   ETH <10 02/03/2018    Metabolic Disorder  Labs: Lab Results  Component Value Date   HGBA1C 6.1 (H) 02/06/2018   MPG 128.37 02/06/2018   No results found for: PROLACTIN Lab Results  Component Value Date   CHOL 204 (H) 02/06/2018   TRIG 43 02/06/2018   HDL 81 02/06/2018   CHOLHDL 2.5 02/06/2018   VLDL 9 02/06/2018   LDLCALC 114 (H) 02/06/2018    Physical Findings: AIMS: Facial and Oral Movements Muscles of Facial Expression: None, normal Lips and Perioral Area: None, normal Jaw: None, normal Tongue: None, normal,Extremity Movements Upper (arms, wrists, hands, fingers): None, normal Lower (legs, knees, ankles, toes): None, normal, Trunk Movements Neck, shoulders, hips: None, normal, Overall Severity Severity of abnormal movements (highest score from questions above): None, normal Incapacitation due to abnormal movements: None, normal Patient's awareness of abnormal movements (rate only patient's report): No Awareness, Dental Status Current problems with teeth and/or dentures?: No Does patient usually wear dentures?: No  CIWA:    COWS:     Musculoskeletal: Strength & Muscle Tone: within normal limits Gait & Station: normal Patient leans: N/A  Psychiatric Specialty Exam: Physical Exam  Nursing note and vitals reviewed. Constitutional: He is oriented to person, place, and time. He appears well-developed and well-nourished.  HENT:  Head: Normocephalic and atraumatic.  Respiratory: Effort normal.  Musculoskeletal: Normal range of motion.  Neurological: He is alert and oriented to person, place, and time.    ROS  Blood pressure 128/86, pulse 71, temperature 98.3 F (36.8 C), temperature source Oral, resp. rate 16, height 5' 7.5" (1.715 m), weight 59.6 kg (131 lb 6.4 oz), SpO2 99 %.Body mass index is 20.28 kg/m.  General Appearance: Casual  Eye Contact:  Fair  Speech:  Normal Rate  Volume:  Decreased  Mood:  Sleepy  Affect:  Congruent  Thought Process:  Coherent  Orientation:  Full (Time, Place, and Person)   Thought Content:  Logical  Suicidal Thoughts:  No  Homicidal Thoughts:  No  Memory:  Immediate;   Good  Judgement:  Fair  Insight:  Fair  Psychomotor Activity:  Psychomotor Retardation  Concentration:  Concentration: Fair  Recall:  Fair  Fund of Knowledge:  Fair  Language:  Good  Akathisia:  No  Handed:  Right  AIMS (if indicated):     Assets:  Communication Skills Desire for Improvement Resilience Vocational/Educational  ADL's:  Intact  Cognition:  WNL  Sleep:  Number of Hours: 6     Treatment Plan Summary: Daily contact  with patient to assess and evaluate symptoms and progress in treatment, Medication management and Plan Patient is seen and examined.  Patient is a 27 year old male with the above-stated past psychiatric history seen in follow-up.  His psychotic symptoms are improving, and his depression is improving.  He is mildly sedated with Abilify during the day.  I am a change it to 10 mg p.o. nightly.  Hopefully that will decrease some of his daytime sedation.  He denied any auditory or visual hallucinations today, his paranoia seems to be somewhat decreased.  We will continue our current treatment.  He did receive counseling this morning from dietary about his diabetes.  Antonieta PertGreg Lawson Lindee Leason, MD 02/08/2018, 2:49 PM

## 2018-02-09 MED ORDER — AMLODIPINE BESYLATE 5 MG PO TABS
5.0000 mg | ORAL_TABLET | Freq: Every day | ORAL | Status: DC
  Administered 2018-02-09 – 2018-02-10 (×2): 5 mg via ORAL
  Filled 2018-02-09 (×6): qty 1

## 2018-02-09 NOTE — Progress Notes (Signed)
[facility] MD Progress Note  02/09/2018 2:46 PM Jacob [name]  MRN:  [id number] Subjective: Patient seen and examined.  Patient is a 27 year old male with a past psychiatric history significant for major depression, severe with psychotic features.  He seen in follow-up.  He is much less sedated today.  He tolerated the Abilify at bedtime, and actually helping sleep.  He denied any side effects of his medications.  He is paranoid he is significantly decreased.  He denied any auditory or visual hallucinations.  He stated his mood was good.  Overall he looks much better than the last several days. Principal Problem: <principal problem not specified> Diagnosis:   Patient Active Problem List   Diagnosis Date Noted  . 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 Severe recurrent major depression without psychotic features (HCC) [F33.2] 02/04/2018   Total Time spent with patient: 20 minutes  Past Psychiatric History: See admission H&P  Past Medical History: History reviewed. No pertinent past medical history. History reviewed. No pertinent surgical history. Family History: History reviewed. No pertinent family history. Family Psychiatric  History: See admission H&P Social History:  Social History   Substance and Sexual Activity  Alcohol Use No     Social History   Substance and Sexual Activity  Drug Use Not on file    Social History   Socioeconomic History  . Marital status: Single    Spouse name: Not on file  . Number of children: Not on file  . Years of education: Not on file  . Highest education level: Not on file  Occupational History  . Not on file  Social Needs  . Financial resource strain: Not on file  . Food insecurity:    Worry: Not on file    Inability: Not on file  . Transportation needs:    Medical: Not on file    Non-medical: Not on file  Tobacco Use  . Smoking status: Never Smoker  . Smokeless tobacco: Never Used  Substance and Sexual  Activity  . Alcohol use: No  . Drug use: Not on file  . Sexual activity: Not on file  Lifestyle  . Physical activity:    Days per week: Not on file    Minutes per session: Not on file  . Stress: Not on file  Relationships  . Social connections:    Talks on phone: Not on file    Gets together: Not on file    Attends religious service: Not on file    Active member of club or organization: Not on file    Attends meetings of clubs or organizations: Not on file    Relationship status: Not on file  Other Topics Concern  . Not on file  Social History Narrative  . Not on file   Additional Social History:                         Sleep: Good  Appetite:  Good  Current Medications: Current Facility-Administered Medications  Medication Dose Route Frequency Provider Last Rate Last Dose  . acetaminophen (TYLENOL) tablet 650 mg  650 mg Oral Q6H PRN Nira Conn A, NP      . alum & mag hydroxide-simeth (MAALOX/MYLANTA) 200-200-20 MG/5ML suspension 30 mL  30 mL Oral Q4H PRN Nira Conn A, NP      . ARIPiprazole (ABILIFY) tablet 10 mg  10 mg Oral QHS Antonieta Pert, MD   10 mg at 02/08/18  2117  . feeding supplement (ENSURE ENLIVE) (ENSURE ENLIVE) liquid 237 mL  237 mL Oral BID BM Money, Gerlene Burdockravis B, FNP   237 mL at 02/09/18 0808  . hydrOXYzine (ATARAX/VISTARIL) tablet 25 mg  25 mg Oral Q6H PRN Cobos, Fernando A, MD      . magnesium hydroxide (MILK OF MAGNESIA) suspension 30 mL  30 mL Oral Daily PRN Nira ConnBerry, Jason A, NP      . sertraline (ZOLOFT) tablet 50 mg  50 mg Oral Daily Cobos, Rockey SituFernando A, MD   50 mg at 02/09/18 0807  . traZODone (DESYREL) tablet 50 mg  50 mg Oral QHS PRN Cobos, Rockey SituFernando A, MD        Lab Results: No results found for this or any previous visit (from the past 48 hour(s)).  Blood Alcohol level:  Lab Results  Component Value Date   ETH <10 02/03/2018    Metabolic Disorder Labs: Lab Results  Component Value Date   HGBA1C 6.1 (H) 02/06/2018   MPG 128.37  02/06/2018   No results found for: PROLACTIN Lab Results  Component Value Date   CHOL 204 (H) 02/06/2018   TRIG 43 02/06/2018   HDL 81 02/06/2018   CHOLHDL 2.5 02/06/2018   VLDL 9 02/06/2018   LDLCALC 114 (H) 02/06/2018    Physical Findings: AIMS: Facial and Oral Movements Muscles of Facial Expression: None, normal Lips and Perioral Area: None, normal Jaw: None, normal Tongue: None, normal,Extremity Movements Upper (arms, wrists, hands, fingers): None, normal Lower (legs, knees, ankles, toes): None, normal, Trunk Movements Neck, shoulders, hips: None, normal, Overall Severity Severity of abnormal movements (highest score from questions above): None, normal Incapacitation due to abnormal movements: None, normal Patient's awareness of abnormal movements (rate only patient's report): No Awareness, Dental Status Current problems with teeth and/or dentures?: No Does patient usually wear dentures?: No  CIWA:    COWS:     Musculoskeletal: Strength & Muscle Tone: within normal limits Gait & Station: normal Patient leans: N/A  Psychiatric Specialty Exam: Physical Exam  Nursing note and vitals reviewed. Constitutional: He is oriented to person, place, and time. He appears well-developed and well-nourished.  HENT:  Head: Normocephalic and atraumatic.  Respiratory: Effort normal.  Musculoskeletal: Normal range of motion.  Neurological: He is alert and oriented to person, place, and time.    ROS  Blood pressure (!) 130/95, pulse 79, temperature 97.7 F (36.5 C), temperature source Oral, resp. rate 18, height 5' 7.5" (1.715 m), weight 59.6 kg (131 lb 6.4 oz), SpO2 99 %.Body mass index is 20.28 kg/m.  General Appearance: Casual  Eye Contact:  Good  Speech:  Clear and Coherent  Volume:  Normal  Mood:  Euthymic  Affect:  Congruent  Thought Process:  Coherent  Orientation:  Full (Time, Place, and Person)  Thought Content:  Logical  Suicidal Thoughts:  No  Homicidal Thoughts:   No  Memory:  Immediate;   Fair  Judgement:  Fair  Insight:  Fair  Psychomotor Activity:  Normal  Concentration:  Concentration: Fair  Recall:  Fair  Fund of Knowledge:  Good  Language:  Fair  Akathisia:  No  Handed:  Right  AIMS (if indicated):     Assets:  Communication Skills Desire for Improvement Financial Resources/Insurance Housing Physical Health Resilience Vocational/Educational  ADL's:  Intact  Cognition:  WNL  Sleep:  Number of Hours: 6.75     Treatment Plan Summary: Daily contact with patient to assess and evaluate symptoms and progress in treatment,  Medication management and Plan Patient is seen and examined.  Patient is a 27 year old male with the above-stated past psychiatric history seen in follow-up.  He continues to improve.  He denied any paranoid thoughts, no auditory or visual hallucinations.  His sleep is good.  The oversedation from the Abilify given during the day is gone, and is tolerating it at bedtime.  He is pleasant and denied suicidal ideation.  As long as everything goes well tonight.,  Will plan on discharge tomorrow if we can get in contact with his grandmother and arrange those processes.  No other changes in his medications.  Antonieta Pert, MD 02/09/2018, 2:46 PM

## 2018-02-09 NOTE — Progress Notes (Signed)
Adult Psychoeducational Group Note  Date:  02/09/2018 Time:  4:45 PM  Group Topic/Focus:  Goals Group:   The focus of this group is to help patients establish daily goals to achieve during treatment and discuss how the patient can incorporate goal setting into their daily lives to aide in recovery.  Participation Level:  Active  Participation Quality:  Appropriate  Affect:  Appropriate  Cognitive:  Alert  Insight: Appropriate  Engagement in Group:  Engaged  Modes of Intervention:  Activity  Additional Comments:  Pt attended group and participated in group activity.  Anderson Middlebrooks R Esteen Delpriore 02/09/2018, 4:45 PM

## 2018-02-09 NOTE — [facility] Group Notes (Signed)
Adult Psychoeducational Group Note  Date:  02/09/2018 Time:  9:54 PM  Group Topic/Focus:  Wrap-Up Group:   The focus of this group is to help patients review their daily goal of treatment and discuss progress on daily workbooks.  Participation Level:  Active  Participation Quality:  Appropriate and Attentive  Affect:  Appropriate  Cognitive:  Alert and Appropriate  Insight: Appropriate and Good  Engagement in Group:  Engaged  Modes of Intervention:  Discussion and Education  Additional Comments:  Pt attended and participated in wrap up group this evening. Pt had a pretty good day due to them eating well and having two good "sessions"(groups) today. Pt goal was to find resolutions to problems they are having, such as family issues, communication issues and anger. Pt got to see their mother today, which was a positive.   Chrisandra NettersOctavia A Kinslie Hove 02/09/2018, 9:54 PM

## 2018-02-09 NOTE — Progress Notes (Signed)
Dar Note: Patient presents with calm affect and pleasant mood.  Denies suicidal thoughts, pain, auditory and visual hallucinations.  Medications given as prescribed.  Routine safety checks maintained every 15 minutes.  Described energy level as normal and concentration as good.  Attended group and participated.  Patient visible in milieu interacting and socializing with peers.  Support and encouragement offered as needed.  Patient is safe on the unit.

## 2018-02-09 NOTE — Progress Notes (Addendum)
Patient ID: Jacob LibmanDarion [name], male   DOB: Aug 14, 1991, 27 y.o.   MRN: [id number]  Assessment complete with in person assessment and assessment of a peer. Pt remains in his room, approaches nurses station to receive nighttime medications. Minimal interaction. Pt reports good sleep, refuses any interventions.   Pt's labs and vitals were monitored throughout the night. Pt supported and encouraged to express concerns and questions.   Pt's safety ensured with 15 minute and environmental checks. Pt currently denies SI/HI and A/V hallucinations. Pt presented with psychosis on admission. Pt verbally agrees to seek staff if SI/HI or A/VH occurs and to consult with staff before acting on any harmful thoughts. Will continue POC.

## 2018-02-09 NOTE — [facility] Group Notes (Signed)
LCSW Group Therapy Note  02/09/2018 1:15pm  Type of Therapy/Topic:  Group Therapy:  Balance in Life  Participation Level:  Minimal  Description of Group:    This group will address the concept of balance and how it feels and looks when one is unbalanced. Patients will be encouraged to process areas in their lives that are out of balance and identify reasons for remaining unbalanced. Facilitators will guide patients in utilizing problem-solving interventions to address and correct the stressor making their life unbalanced. Understanding and applying boundaries will be explored and addressed for obtaining and maintaining a balanced life. Patients will be encouraged to explore ways to assertively make their unbalanced needs known to significant others in their lives, using other group members and facilitator for support and feedback.  Therapeutic Goals: 1. Patient will identify two or more emotions or situations they have that consume much of in their lives. 2. Patient will identify signs/triggers that life has become out of balance:  3. Patient will identify two ways to set boundaries in order to achieve balance in their lives:  4. Patient will demonstrate ability to communicate their needs through discussion and/or role plays  Summary of Patient Progress: Patient appropriately participated in group. Patient identified mental health as an aspect of his life that feels most imbalance at this time.   Therapeutic Modalities:   Cognitive Behavioral Therapy Solution-Focused Therapy Assertiveness Training  Darreld McleanCharlotte C Marjan Rosman, Student-Social Work 02/09/2018 12:58 PM

## 2018-02-10 MED ORDER — SERTRALINE HCL 50 MG PO TABS: 50.0000 mg | ORAL_TABLET | Freq: Every day | ORAL | 0 refills | Status: DC

## 2018-02-10 MED ORDER — AMLODIPINE BESYLATE 5 MG PO TABS: 5.0000 mg | ORAL_TABLET | Freq: Every day | ORAL | 0 refills | Status: DC

## 2018-02-10 MED ORDER — ARIPIPRAZOLE 10 MG PO TABS: 10.0000 mg | ORAL_TABLET | Freq: Every day | ORAL | 0 refills | Status: DC

## 2018-02-10 MED ORDER — TRAZODONE HCL 50 MG PO TABS: 50.0000 mg | ORAL_TABLET | Freq: Every evening | ORAL | 0 refills | Status: DC | PRN

## 2018-02-10 MED ORDER — HYDROXYZINE HCL 25 MG PO TABS: 25.0000 mg | ORAL_TABLET | Freq: Four times a day (QID) | ORAL | 0 refills | Status: DC | PRN

## 2018-02-10 NOTE — Progress Notes (Signed)
  [facility]  Will you be returning to the same living situation after discharge:  Yes,  with mother At discharge, do you have transportation home?: Yes,  mother Do you have the ability to pay for your medications: Yes,  BC[employer]  Release of information consent forms completed and in the chart;  Patient's signature needed at discharge.  Patient to Follow up at: Follow-up Information    [facility]. Go on 02/13/2018.   Why:  Please attend your medication appt with Dr Jackquline BerlinIzediuno on Monday, 02/13/18, at 11:00am. Contact information: [address] [place], [place] [address] P: [phone] F: [phone]       [facility] day(s).   Specialty:  Professional Counselor Why:  Please attend a walk in appt requesting therapy between the hours of 8:30am-12noon or 1pm-2:30pm, Monday-Friday. Contact information: [facility] [address] [place] [address] [phone]           Next level of care provider [facility] Link:no  Safety Planning and Suicide Prevention discussed: Yes,  with mother  Have you used any form of tobacco in the last 30 days? (Cigarettes, Smokeless Tobacco, Cigars, and/or Pipes): No  Has patient been referred to the Quitline?: N/A patient is not a smoker  Patient has been referred for addiction treatment: Yes  Lorri FrederickWierda, Berley Gambrell Jon, LCSW 02/10/2018, 2:02 PM

## 2018-02-10 NOTE — Progress Notes (Signed)
Recreation Therapy Notes  Date: 4.12.19 Time: 9:30 a.m. Location: [address] Dayroom   Group Topic: Stress Management   Goal Area(s) Addresses:  Goal 1.1: To reduce stress  -Patient will feel a reduction in stress level  -Patient will understand the importance of stress management  -Patient will participate during stress management group      Intervention: Stress Management  Activity: Meditation- Patients were in a peaceful environment with soft lighting enhancing patients mood. Patients listened to a body scan meditation on the calm app to help decrease tension and stress levels   Education: Stress Management, Discharge Planning.    Education Outcome: Acknowledges edcuation/In group clarification offered/Needs additional education   Clinical Observations/Feedback:: Patient did not attend     Jream Broyles, Recreation Therapy Intern   Jacob [name] 02/10/2018 8:44 AM 

## 2018-02-10 NOTE — Progress Notes (Signed)
D:  Patient denied SI and HI while talking to nurse this morning, contracts for safety.  Denied A/V hallucinations. A:  Medications administered per MD orders.  Emotional support and encouragement given patient. R:  Safety maintained with 15 minute checks.

## 2018-02-10 NOTE — Discharge Summary (Addendum)
Physician Discharge Summary Note  Patient:  Jacob [name] is an 27 y.o., male MRN:  [id number] DOB:  03/25/1991 Patient phone:  [phone] (home)  Patient address:   [address] [place] [address],  Total Time spent with patient: Greater than 30 minutes  Date of Admission:  02/04/2018  Date of Discharge: 02-10-18  Reason for Admission: Suicidal ideations/reported thoughts of jumping off a bridge or shooting self .  Principal Problem: Severe recurrent major depression with psychotic features Jacob [name])  Discharge Diagnoses: Patient Active Problem List   Diagnosis Date Noted  . Severe recurrent major depression with psychotic features (HCC) [F33.3]     Priority: High  . Severe recurrent major depression without psychotic features (HCC) [F33.2] 02/04/2018   Past Psychiatric History: severe, MDD  Past Medical History: History reviewed. No pertinent past medical history. History reviewed. No pertinent surgical history. Family History: History reviewed. No pertinent family history. F amily Psychiatric  History: see H&P  Social History:  Social History   Substance and Sexual Activity  Alcohol Use No     Social History   Substance and Sexual Activity  Drug Use Not on file    Social History   Socioeconomic History  . Marital status: Single    Spouse name: Not on file  . Number of children: Not on file  . Years of education: Not on file  . Highest education level: Not on file  Occupational History  . Not on file  Social Needs  . Financial resource strain: Not on file  . Food insecurity:    Worry: Not on file    Inability: Not on file  . Transportation needs:    Medical: Not on file    Non-medical: Not on file  Tobacco Use  . Smoking status: Never Smoker  . Smokeless tobacco: Never Used  Substance and Sexual Activity  . Alcohol use: No  . Drug use: Not on file  . Sexual activity: Not on file  Lifestyle  . Physical activity:    Days per week: Not on  file    Minutes per session: Not on file  . Stress: Not on file  Relationships  . Social connections:    Talks on phone: Not on file    Gets together: Not on file    Attends religious service: Not on file    Active member of club or organization: Not on file    Attends meetings of clubs or organizations: Not on file    Relationship status: Not on file  Other Topics Concern  . Not on file  Social History Narrative  . Not on file   [name] Course: (Per Md's admission notes): 75 year old single male,  vague historian. States that on day of admission he had been feeling depressed, with low energy level and had not gone to work due to feeling " bad " and  low energy. He presented to ED voluntarily due to suicidal ideations/reported thoughts of jumping off a bridge or shooting self . Currently states  that these thoughts occurred on day of admission and that he had not felt suicidal prior to that.  He also minimizes recent depression or significant neuro-vegetative symptoms leading to admission.   After the above admission assessment, Jacob [name] was started on the medication regimen for his presenting symptoms. He received & was discharged on; Sertraline 50 mg for depression, Hydroxyzine 25 mg prn for anxiety, Abilify 10 mg for mood control & Trazodone 50 mg for insomnia. He  was enrolled & participated in the group counseling sessions being offered & held on this unit. He learned coping skills. He also received/discharged on other medication regimen for the other pre-existing medical issues presented. He tolerated his treatment regimen without any adverse effects or reactions reported.   Jacob [name] is seen today by the attending psychiatrist for discharge. He says he has normal anxiety about going home. He is not overwhelmed by this. He is looking forward to working on his mental health issues. Not expressing any delusions today. No hallucinations. Feels in control of himself. There are no fantasy about  suicide lately. No suicidal thoughts. Looking forward to getting back to his life. No thoughts of violence. Does not feel depressed. No evidence of mania.  The nursing staff reports that patient has been appropriate on the unit. Patient has been interacting well with peers. No behavioral issues. Patient has not voiced any suicidal thoughts. Patient has not been observed to be internally stimulated or preoccupied. Patient has been adherent with the treatment recommendations. Patient has been tolerating his medications well. No reported adverse effects or reactions.   Patient was discussed at the treatment team meeting this morning. The team members feel that patient is back to his baseline level of function. The team agrees with plan to discharge patient today to continue mental health health care on an outpatient basis as noted below. He was provided with all the necessary information to make these apointment without problems. He left [name] with all personal belongings in no apparent distress. Transportation per mother.  Physical Findings: AIMS: Facial and Oral Movements Muscles of Facial Expression: None, normal Lips and Perioral Area: None, normal Jaw: None, normal Tongue: None, normal,Extremity Movements Upper (arms, wrists, hands, fingers): None, normal Lower (legs, knees, ankles, toes): None, normal, Trunk Movements Neck, shoulders, hips: None, normal, Overall Severity Severity of abnormal movements (highest score from questions above): None, normal Incapacitation due to abnormal movements: None, normal Patient's awareness of abnormal movements (rate only patient's report): No Awareness, Dental Status Current problems with teeth and/or dentures?: No Does patient usually wear dentures?: No  CIWA:  CIWA-Ar Total: 1 COWS:  COWS Total Score: 1  Musculoskeletal: Strength & Muscle Tone: within normal limits Gait & Station: normal Patient leans: N/A  Psychiatric Specialty Exam: Physical  Exam  Constitutional: He appears well-developed.  HENT:  Head: Normocephalic.  Eyes: Pupils are equal, round, and reactive to light.  Neck: Normal range of motion.  Cardiovascular: Normal rate.  Respiratory: Effort normal.  GI: Soft.  Genitourinary:  Genitourinary Comments: Deferred  Musculoskeletal: Normal range of motion.  Neurological: He is alert.  Skin: Skin is warm.    Review of Systems  Constitutional: Negative.   HENT: Negative.   Eyes: Negative.   Respiratory: Negative.   Cardiovascular: Negative.   Gastrointestinal: Negative.   Genitourinary: Negative.   Musculoskeletal: Negative.   Skin: Negative.   Neurological: Negative.   Endo/Heme/Allergies: Negative.   Psychiatric/Behavioral: Positive for depression (Stable). Negative for hallucinations, memory loss, substance abuse and suicidal ideas. The patient has insomnia (Stable). The patient is not nervous/anxious.     Blood pressure 138/86, pulse 80, temperature 97.8 F (36.6 C), temperature source Oral, resp. rate 16, height 5' 7.5" (1.715 m), weight 59.6 kg (131 lb 6.4 oz), SpO2 99 %.Body mass index is 20.28 kg/m.  See Md's SRA  Have you used any form of tobacco in the last 30 days? (Cigarettes, Smokeless Tobacco, Cigars, and/or Pipes): No  Has this patient used any form  of tobacco in the last 30 days? (Cigarettes, Smokeless Tobacco, Cigars, and/or Pipes): N/A  Blood Alcohol level:  Lab Results  Component Value Date   ETH <10 02/03/2018    Metabolic Disorder Labs:  Lab Results  Component Value Date   HGBA1C 6.1 (H) 02/06/2018   MPG 128.37 02/06/2018   No results found for: PROLACTIN Lab Results  Component Value Date   CHOL 204 (H) 02/06/2018   TRIG 43 02/06/2018   HDL 81 02/06/2018   CHOLHDL 2.5 02/06/2018   VLDL 9 02/06/2018   LDLCALC 114 (H) 02/06/2018   See Psychiatric Specialty Exam and Suicide Risk Assessment completed by Attending Physician prior to discharge.  Discharge destination:   Home  Is patient on multiple antipsychotic therapies at discharge:  No   Has Patient had three or more failed trials of antipsychotic monotherapy by history:  No  Recommended Plan for Multiple Antipsychotic Therapies: NA  Allergies as of 02/10/2018   No Known Allergies     Medication List    STOP taking these medications   cyclobenzaprine 10 MG tablet Commonly known as:  FLEXERIL   ibuprofen 800 MG tablet Commonly known as:  ADVIL,MOTRIN     TAKE these medications     Indication  amLODipine 5 MG tablet Commonly known as:  NORVASC Take 1 tablet (5 mg total) by mouth daily. For high blood pressure  Indication:  High Blood Pressure Disorder   ARIPiprazole 10 MG tablet Commonly known as:  ABILIFY Take 1 tablet (10 mg total) by mouth at bedtime. For mood control  Indication:  Mood control   hydrOXYzine 25 MG tablet Commonly known as:  ATARAX/VISTARIL Take 1 tablet (25 mg total) by mouth every 6 (six) hours as needed for anxiety.  Indication:  Feeling Anxious   sertraline 50 MG tablet Commonly known as:  ZOLOFT Take 1 tablet (50 mg total) by mouth daily. For depression  Indication:  Major Depressive Disorder   traZODone 50 MG tablet Commonly known as:  DESYREL Take 1 tablet (50 mg total) by mouth at bedtime as needed for sleep.  Indication:  Trouble Sleeping      Follow-up Information    [facility]. Go on 02/13/2018.   Why:  Please attend your medication appt with Dr Jackquline Berlin on Monday, 02/13/18, at 11:00am. Contact information: [address] [place], [place] [address] P: [phone] F: [phone]       [facility] Go in 7 day(s).   Specialty:  Professional Counselor Why:  Please attend a walk in appt requesting therapy between the hours of 8:30am-12noon or 1pm-2:30pm, Monday-Friday. Contact information: [facility] [address] [place] [address] [phone]          Follow-up  recommendations:  Activity:  As tolerated Diet: As recommended by your primary care doctor. Keep all scheduled follow-up appointments as recommended.  Comments: Patient is instructed prior to discharge to: Take all medications as prescribed by his/her mental healthcare provider. Report any adverse effects and or reactions from the medicines to his/her outpatient provider promptly. Patient has been instructed & cautioned: To not engage in alcohol and or illegal drug use while on prescription medicines. In the event of worsening symptoms, patient is instructed to call the crisis hotline, 911 and or go to the nearest ED for appropriate evaluation and treatment of symptoms. To follow-up with his/her primary care provider for your other medical issues, concerns and or health care needs.  Signed: Armandina StammerAgnes Tyresa Prindiville, NP, PMHNP, FNP 02/11/2018, 3:30 PM

## 2018-02-10 NOTE — Progress Notes (Signed)
CSW spoke to pt mother, [name], who did visit with pt last night and felt like pt "was himself."  She is in agreement with plan to discharge but would like CSW to talk with pt about meeting with a counselor in addition to his MD/Medication appts. [name], MSW, LCSW Clinical Social Worker 02/10/2018 9:53 AM

## 2018-02-10 NOTE — Progress Notes (Signed)
Discharge Note:  Patient discharged.  Patient denied SI and HI.  Denied A/V hallucinations.  Suicide prevention information given and discussed with patient who stated he understood and had no questions.  Patient stated he appreciated all assistance received from [facility] staff.  Patient stated he received all his belongings, clothing, toiletries, misc items, prescriptions, etc.  All required discharge information given to patient at discharge.   

## 2018-02-10 NOTE — [facility] Suicide Risk Assessment (Signed)
[facility]   Principal Problem: <principal problem not specified> Discharge Diagnoses:  Patient Active Problem List   Diagnosis Date Noted  . 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 Severe recurrent major depression without psychotic features (HCC) [F33.2] 02/04/2018    Total Time spent with patient: 30 minutes  Musculoskeletal: Strength & Muscle Tone: within normal limits Gait & Station: normal Patient leans: N/A  Psychiatric Specialty Exam: ROS  Blood pressure 138/86, pulse 80, temperature 97.8 F (36.6 C), temperature source Oral, resp. rate 16, height 5' 7.5" (1.715 m), weight 59.6 kg (131 lb 6.4 oz), SpO2 99 %.Body mass index is 20.28 kg/m.  General Appearance: Casual  Eye Contact::  Good  Speech:  Normal Rate409  Volume:  Normal  Mood:  Euthymic  Affect:  Congruent  Thought Process:  Coherent  Orientation:  Full (Time, Place, and Person)  Thought Content:  Logical  Suicidal Thoughts:  No  Homicidal Thoughts:  No  Memory:  Immediate;   Good  Judgement:  Fair  Insight:  Fair  Psychomotor Activity:  Normal  Concentration:  Good  Recall:  Good  Fund of Knowledge:Good  Language: Good  Akathisia:  No  Handed:  Right  AIMS (if indicated):     Assets:  Communication Skills Desire for Improvement Housing Resilience  Sleep:  Number of Hours: 6.75  Cognition: WNL  ADL's:  Intact   Mental Status Per Nursing Assessment::   On Admission:     Demographic Factors:  Male and Low socioeconomic status  Loss Factors: Financial problems/change in socioeconomic status  Historical Factors: Impulsivity  Risk Reduction Factors:   Sense of responsibility to family, Positive social support and Positive therapeutic relationship  Continued Clinical Symptoms:  Alcohol/Substance Abuse/Dependencies  Cognitive Features That Contribute To Risk:  None    Suicide Risk:  Minimal: No identifiable suicidal  ideation.  Patients presenting with no risk factors but with morbid ruminations; may be classified as minimal risk based on the severity of the depressive symptoms  Follow-up Information    [facility]. Go on 02/13/2018.   Why:  Please attend your medication appt with Dr Jackquline BerlinIzediuno on Monday, 02/13/18, at 11:00am. Contact information: [address] [place], [place] [address] P: [phone] F: [phone]       [facility] Go in 7 day(s).   Specialty:  Professional Counselor Why:  Please attend a walk in appt requesting therapy between the hours of 8:30am-12noon or 1pm-2:30pm, Monday-Friday. Contact information: [facility] [address] [place] [address] [phone]           Plan Of Care/Follow-up recommendations:  Activity:  ad lib  Antonieta PertGreg Lawson Wilmer Berryhill, MD 02/10/2018, 2:39 PM

## 2018-02-10 NOTE — [facility] Group Notes (Signed)
[facility] LCSW Group Therapy Note  Date/Time: 02/10/18, 1315  Type of Therapy and Topic:  Group Therapy:  Feelings around Relapse and Recovery  Participation Level:  None   Mood:pleasant  Description of Group:    Patients in this group will discuss emotions they experience before and after a relapse. They will process how experiencing these feelings, or avoidance of experiencing them, relates to having a relapse. Facilitator will guide patients to explore emotions they have related to recovery. Patients will be encouraged to process which emotions are more powerful. They will be guided to discuss the emotional reaction significant others in their lives may have to patients' relapse or recovery. Patients will be assisted in exploring ways to respond to the emotions of others without this contributing to a relapse.  Therapeutic Goals: 1. Patient will identify two or more emotions that lead to relapse for them:  2. Patient will identify two emotions that result when they relapse:  3. Patient will identify two emotions related to recovery:  4. Patient will demonstrate ability to communicate their needs through discussion and/or role plays.   Summary of Patient Progress: Pt was attentive during group discussion but did not participate.     Therapeutic Modalities:   Cognitive Behavioral Therapy Solution-Focused Therapy Assertiveness Training Relapse Prevention Therapy  Daleen SquibbGreg Katrina Brosh, LCSW

## 2018-02-10 NOTE — Tx Team (Signed)
Interdisciplinary Treatment and Diagnostic Plan Update  02/10/2018 Time of Session: 0845am Kutter Schnepf MRN: [id number]  Principal Diagnosis: <principal problem not specified>  Secondary Diagnoses: Active Problems:   Severe recurrent major depression without psychotic features (HCC)   Severe recurrent major depression with psychotic features (HCC)   Current Medications:  Current Facility-Administered Medications  Medication Dose Route Frequency Provider Last Rate Last Dose  . acetaminophen (TYLENOL) tablet 650 mg  650 mg Oral Q6H PRN Nira Conn A, NP      . alum & mag hydroxide-simeth (MAALOX/MYLANTA) 200-200-20 MG/5ML suspension 30 mL  30 mL Oral Q4H PRN Nira Conn A, NP      . amLODipine (NORVASC) tablet 5 mg  5 mg Oral Daily Antonieta Pert, MD   5 mg at 02/10/18 0744  . ARIPiprazole (ABILIFY) tablet 10 mg  10 mg Oral QHS Antonieta Pert, MD   10 mg at 02/09/18 2127  . feeding supplement (ENSURE ENLIVE) (ENSURE ENLIVE) liquid 237 mL  237 mL Oral BID BM Money, Gerlene Burdock, FNP   237 mL at 02/09/18 0808  . hydrOXYzine (ATARAX/VISTARIL) tablet 25 mg  25 mg Oral Q6H PRN Cobos, Fernando A, MD      . magnesium hydroxide (MILK OF MAGNESIA) suspension 30 mL  30 mL Oral Daily PRN Nira Conn A, NP      . sertraline (ZOLOFT) tablet 50 mg  50 mg Oral Daily Cobos, Rockey Situ, MD   50 mg at 02/10/18 0743  . traZODone (DESYREL) tablet 50 mg  50 mg Oral QHS PRN Cobos, Rockey Situ, MD       PTA Medications: Medications Prior to Admission  Medication Sig Dispense Refill Last Dose  . cyclobenzaprine (FLEXERIL) 10 MG tablet Take 1 tablet (10 mg total) by mouth 2 (two) times daily as needed for muscle spasms. (Patient not taking: Reported on 02/04/2018) 14 tablet 0 Completed Course at Unknown time  . ibuprofen (ADVIL,MOTRIN) 800 MG tablet Take 1 tablet (800 mg total) by mouth 3 (three) times daily. (Patient not taking: Reported on 02/04/2018) 21 tablet 0 Completed Course at Unknown time    Patient  Stressors: Occupational concerns Other: increased anxiety and loneliness  Patient Strengths: Ability for insight Average or above average intelligence Communication skills Motivation for treatment/growth Physical Health Supportive family/friends  Treatment Modalities: Medication Management, Group therapy, Case management,  1 to 1 session with clinician, Psychoeducation, Recreational therapy.   Physician Treatment Plan for Primary Diagnosis: <principal problem not specified> Long Term Goal(s): Improvement in symptoms so as ready for discharge Improvement in symptoms so as ready for discharge   Short Term Goals: Ability to identify changes in lifestyle to reduce recurrence of condition will improve Ability to identify and develop effective coping behaviors will improve Ability to identify triggers associated with substance abuse/mental health issues will improve  Medication Management: Evaluate patient's response, side effects, and tolerance of medication regimen.  Therapeutic Interventions: 1 to 1 sessions, Unit Group sessions and Medication administration.  Evaluation of Outcomes: Adequate for Discharge  Physician Treatment Plan for Secondary Diagnosis: Active Problems:   Severe recurrent major depression without psychotic features (HCC)   Severe recurrent major depression with psychotic features (HCC)  Long Term Goal(s): Improvement in symptoms so as ready for discharge Improvement in symptoms so as ready for discharge   Short Term Goals: Ability to identify changes in lifestyle to reduce recurrence of condition will improve Ability to identify and develop effective coping behaviors will improve Ability to identify triggers associated  with substance abuse/mental health issues will improve     Medication Management: Evaluate patient's response, side effects, and tolerance of medication regimen.  Therapeutic Interventions: 1 to 1 sessions, Unit Group sessions and Medication  administration.  Evaluation of Outcomes: Adequate for Discharge   RN Treatment Plan for Primary Diagnosis: <principal problem not specified> Long Term Goal(s): Knowledge of disease and therapeutic regimen to maintain health will improve  Short Term Goals: Ability to remain free from injury will improve, Ability to verbalize frustration and anger appropriately will improve, Ability to demonstrate self-control, Ability to participate in decision making will improve, Ability to verbalize feelings will improve, Ability to disclose and discuss suicidal ideas, Ability to identify and develop effective coping behaviors will improve and Compliance with prescribed medications will improve  Medication Management: RN will administer medications as ordered by provider, will assess and evaluate patient's response and provide education to patient for prescribed medication. RN will report any adverse and/or side effects to prescribing provider.  Therapeutic Interventions: 1 on 1 counseling sessions, Psychoeducation, Medication administration, Evaluate responses to treatment, Monitor vital signs and CBGs as ordered, Perform/monitor CIWA, COWS, AIMS and Fall Risk screenings as ordered, Perform wound care treatments as ordered.  Evaluation of Outcomes: Adequate for Discharge   LCSW Treatment Plan for Primary Diagnosis: <principal problem not specified> Long Term Goal(s): Safe transition to appropriate next level of care at discharge, Engage patient in therapeutic group addressing interpersonal concerns.  Short Term Goals: Engage patient in aftercare planning with referrals and resources, Increase social support, Increase ability to appropriately verbalize feelings, Increase emotional regulation, Facilitate acceptance of mental health diagnosis and concerns, Facilitate patient progression through stages of change regarding substance use diagnoses and concerns, Identify triggers associated with mental  health/substance abuse issues and Increase skills for wellness and recovery  Therapeutic Interventions: Assess for all discharge needs, 1 to 1 time with Social worker, Explore available resources and support systems, Assess for adequacy in community support network, Educate family and significant other(s) on suicide prevention, Complete Psychosocial Assessment, Interpersonal group therapy.  Evaluation of Outcomes: Adequate for Discharge   Progress in Treatment: Attending groups: Yes. Participating in groups: Yes. Minimally  Taking medication as prescribed: Yes. Toleration medication: Yes. Family/Significant other contact made: Yes, individual(s) contacted:  [name] Patient understands diagnosis: Yes. Discussing patient identified problems/goals with staff: Yes. Medical problems stabilized or resolved: Yes. Denies suicidal/homicidal ideation: Yes. Issues/concerns per patient self-inventory: No. Other:   New problem(s) identified: None  New Short Term/Long Term Goal(s): medication stabilization, elimination of SI thoughts, development of comprehensive mental wellness plan.   Patient Goals: "I want to be confident and figure out how to cope with my anxiety and depression"  Discharge Plan or Barriers: CSW will continue to assess for an appropriate discharge plan.   Reason for Continuation of Hospitalization: Anxiety Depression Medication stabilization  Estimated Length of Stay: Friday, 02/10/18  Attendees: Patient: 02/10/2018   Physician: Dr Jola Babinskilary, MD 02/10/2018   Nursing: Lamount Crankerhris Judge, RN 02/10/2018   RN Care Manager: 02/10/2018   Social Worker: Daleen SquibbGreg Stephany Poorman, LCSW 02/10/2018   Recreational Therapist:  02/10/2018   Other:  02/10/2018   Other:  02/10/2018        Scribe for Treatment Team: Lorri FrederickWierda, Eliasar Hlavaty Jon, LCSW 02/10/2018 9:45 AM

## 2018-08-26 ENCOUNTER — Inpatient Hospital Stay (HOSPITAL_COMMUNITY)
Admission: EM | Admit: 2018-08-26 | Discharge: 2018-08-28 | DRG: 918 | Disposition: A | Discharge status: Other Acute Inpatient Hospital | Payer: BLUE CROSS/BLUE SHIELD | Attending: Internal Medicine | Admitting: Internal Medicine

## 2018-08-26 ENCOUNTER — Encounter (HOSPITAL_COMMUNITY): Payer: Self-pay | Admitting: Emergency Medicine

## 2018-08-26 ENCOUNTER — Other Ambulatory Visit: Payer: Self-pay

## 2018-08-26 DIAGNOSIS — F332 Major depressive disorder, recurrent severe without psychotic features: Secondary | ICD-10-CM | POA: Diagnosis present

## 2018-08-26 DIAGNOSIS — Z79899 Other long term (current) drug therapy: Secondary | ICD-10-CM | POA: Diagnosis not present

## 2018-08-26 DIAGNOSIS — F419 Anxiety disorder, unspecified: Secondary | ICD-10-CM | POA: Diagnosis not present

## 2018-08-26 DIAGNOSIS — T1491XA Suicide attempt, initial encounter: Secondary | ICD-10-CM | POA: Diagnosis present

## 2018-08-26 DIAGNOSIS — T604X2A Toxic effect of rodenticides, intentional self-harm, initial encounter: Secondary | ICD-10-CM | POA: Diagnosis present

## 2018-08-26 DIAGNOSIS — F1290 Cannabis use, unspecified, uncomplicated: Secondary | ICD-10-CM | POA: Diagnosis not present

## 2018-08-26 DIAGNOSIS — T6592XA Toxic effect of unspecified substance, intentional self-harm, initial encounter: Secondary | ICD-10-CM

## 2018-08-26 DIAGNOSIS — G4700 Insomnia, unspecified: Secondary | ICD-10-CM | POA: Diagnosis not present

## 2018-08-26 DIAGNOSIS — F333 Major depressive disorder, recurrent, severe with psychotic symptoms: Secondary | ICD-10-CM | POA: Diagnosis not present

## 2018-08-26 LAB — CBC
HCT: 42.5 % (ref 39.0–52.0)
Hemoglobin: 13.4 g/dL (ref 13.0–17.0)
MCH: 28 pg (ref 26.0–34.0)
MCHC: 31.5 g/dL (ref 30.0–36.0)
MCV: 88.7 fL (ref 80.0–100.0)
Platelets: 310 10*3/uL (ref 150–400)
RBC: 4.79 MIL/uL (ref 4.22–5.81)
RDW: 12.2 % (ref 11.5–15.5)
WBC: 5.2 10*3/uL (ref 4.0–10.5)
nRBC: 0 % (ref 0.0–0.2)

## 2018-08-26 LAB — COMPREHENSIVE METABOLIC PANEL
ALT: 16 U/L (ref 0–44)
AST: 15 U/L (ref 15–41)
Albumin: 4.2 g/dL (ref 3.5–5.0)
Alkaline Phosphatase: 58 U/L (ref 38–126)
Anion gap: 9 (ref 5–15)
BUN: 6 mg/dL (ref 6–20)
CO2: 28 mmol/L (ref 22–32)
Calcium: 9.7 mg/dL (ref 8.9–10.3)
Chloride: 101 mmol/L (ref 98–111)
Creatinine, Ser: 1.12 mg/dL (ref 0.61–1.24)
GFR calc Af Amer: >60 mL/min (ref >60)
GFR calc non Af Amer: >60 mL/min (ref >60)
Glucose, Bld: 145 mg/dL — ABNORMAL HIGH (ref 70–99)
Potassium: 3.7 mmol/L (ref 3.5–5.1)
Sodium: 138 mmol/L (ref 135–145)
Total Bilirubin: 1.2 mg/dL (ref 0.3–1.2)
Total Protein: 6.9 g/dL (ref 6.5–8.1)

## 2018-08-26 LAB — RAPID URINE DRUG SCREEN, HOSP PERFORMED
Amphetamines: NOT DETECTED
Barbiturates: NOT DETECTED
Benzodiazepines: NOT DETECTED
Cocaine: NOT DETECTED
Opiates: NOT DETECTED
Tetrahydrocannabinol: NOT DETECTED

## 2018-08-26 LAB — ETHANOL: Alcohol, Ethyl (B): <10 mg/dL (ref <10)

## 2018-08-26 LAB — PROTIME-INR
INR: 1.02
INR: 1.08
Prothrombin Time: 13.3 seconds (ref 11.4–15.2)
Prothrombin Time: 13.9 seconds (ref 11.4–15.2)

## 2018-08-26 LAB — SALICYLATE LEVEL: Salicylate Lvl: <7 mg/dL (ref 2.8–30.0)

## 2018-08-26 LAB — APTT
aPTT: 29 seconds (ref 24–36)
aPTT: 29 seconds (ref 24–36)

## 2018-08-26 LAB — ACETAMINOPHEN LEVEL: Acetaminophen (Tylenol), Serum: <10 ug/mL — ABNORMAL LOW (ref 10–30)

## 2018-08-26 LAB — CBG MONITORING, ED: Glucose-Capillary: 163 mg/dL — ABNORMAL HIGH (ref 70–99)

## 2018-08-26 MED ORDER — ACETAMINOPHEN 325 MG PO TABS: 650.0000 mg | ORAL_TABLET | Freq: Four times a day (QID) | ORAL | Status: DC | PRN

## 2018-08-26 MED ORDER — SENNOSIDES-DOCUSATE SODIUM 8.6-50 MG PO TABS: 1.0000 | ORAL_TABLET | Freq: Every evening | ORAL | Status: DC | PRN

## 2018-08-26 MED ORDER — FAMOTIDINE 20 MG PO TABS
20.0000 mg | ORAL_TABLET | Freq: Every day | ORAL | Status: DC
  Administered 2018-08-26 – 2018-08-28 (×2): 20 mg via ORAL
  Filled 2018-08-26 (×3): qty 1

## 2018-08-26 MED ORDER — ONDANSETRON HCL 4 MG PO TABS: 4.0000 mg | ORAL_TABLET | Freq: Three times a day (TID) | ORAL | Status: DC | PRN

## 2018-08-26 MED ORDER — ACETAMINOPHEN 650 MG RE SUPP: 650.0000 mg | Freq: Four times a day (QID) | RECTAL | Status: DC | PRN

## 2018-08-26 NOTE — Progress Notes (Signed)
Poison control called checking on PT/INR. Stated would close case and to let them know if elevated again with recheck on Monday.   Larey Days, RN

## 2018-08-26 NOTE — ED Notes (Addendum)
Lunch tray ordered for pt.

## 2018-08-26 NOTE — H&P (Signed)
Date: 08/26/2018               Patient Name:  Jacob [name] MRN: [id number]  DOB: 05/30/1991 Age / Sex: 27 y.o., male   PCP: Patient, No Pcp Per         Medical Service: Internal Medicine Teaching Service         Attending Physician: Dr. Burns Spain, MD    First Contact: Dr. Karilyn Cota Pager: [phone]  Second Contact: Dr. Mikey Bussing Pager: [phone]       After Hours (After 5p/  First Contact Pager: [phone]  weekends / holidays): Second Contact Pager: [phone]   Chief Complaint: Suicide attempt with rat poison  History of Present Illness: [name] is a 27 year old male with past medical history significant for major depression with psychotic features presenting to the ED after ingesting rat poison in a suicide attempt.  He reports yesterday around 2 PM he mixed 3 balls of wrap poison into his ice cream.  He stated he let them dissolve before eating the ice cream.  He then mixed some rat poison with a glass of water which he drink.  He stated he did not "really digest" any rat poison.  He denied any sometimes after ingesting poison such as abdominal pain, nausea, vomiting, diarrhea or bleeding.  He states he felt fine after ingesting the poison and continued on with his day.  He went to the pharmacy after and bought some sort of vitamin supplement.  He denied any auditory or visual hallucinations.  He did state he sees things but its "reality".  He denied any suicidal ideations at this time and said he had no thoughts of harming or killing anyone else.  He states he called all his family members before he ingested the poison his mom calmed him down however he still proceeded with his plan.  He reported he has not been taking any medications.  He stated he wants to use more natural remedies.  In the ED, he was normotensive, afebrile, and saturating well on room air.  His CMP, rapid urine drug screen, PT, INR, PTT and CBC were all within normal limits.  Acetaminophen, ethanol and salicylate  levels were wnl.  He was admitted for 24 hours and 72-hour post ingestion coagulant observation per [place] poison control.  Meds:  No current facility-administered medications on file prior to encounter.    Current Outpatient Medications on File Prior to Encounter  Medication Sig Dispense Refill  . hydrOXYzine (ATARAX/VISTARIL) 25 MG tablet Take 1 tablet (25 mg total) by mouth every 6 (six) hours as needed for anxiety. 60 tablet 0  . amLODipine (NORVASC) 5 MG tablet Take 1 tablet (5 mg total) by mouth daily. For high blood pressure (Patient not taking: Reported on 08/26/2018) 10 tablet 0  . ARIPiprazole (ABILIFY) 10 MG tablet Take 1 tablet (10 mg total) by mouth at bedtime. For mood control (Patient not taking: Reported on 08/26/2018) 30 tablet 0  . sertraline (ZOLOFT) 50 MG tablet Take 1 tablet (50 mg total) by mouth daily. For depression (Patient not taking: Reported on 08/26/2018) 30 tablet 0  . traZODone (DESYREL) 50 MG tablet Take 1 tablet (50 mg total) by mouth at bedtime as needed for sleep. (Patient not taking: Reported on 08/26/2018) 30 tablet 0   Current Meds  Medication Sig  . hydrOXYzine (ATARAX/VISTARIL) 25 MG tablet Take 1 tablet (25 mg total) by mouth every 6 (six) hours as needed for anxiety.  Allergies: Allergies as of 08/26/2018  . (No Known Allergies)   History reviewed. No pertinent past medical history.  Family History: No family history on file.  Social History:  Social History   Socioeconomic History  . Marital status: Single    Spouse name: Not on file  . Number of children: Not on file  . Years of education: Not on file  . Highest education level: Not on file  Occupational History  . Not on file  Social Needs  . Financial resource strain: Not on file  . Food insecurity:    Worry: Not on file    Inability: Not on file  . Transportation needs:    Medical: Not on file    Non-medical: Not on file  Tobacco Use  . Smoking status: Never Smoker  .  Smokeless tobacco: Never Used  Substance and Sexual Activity  . Alcohol use: No  . Drug use: Not Currently  . Sexual activity: Not on file  Lifestyle  . Physical activity:    Days per week: Not on file    Minutes per session: Not on file  . Stress: Not on file  Relationships  . Social connections:    Talks on phone: Not on file    Gets together: Not on file    Attends religious service: Not on file    Active member of club or organization: Not on file    Attends meetings of clubs or organizations: Not on file    Relationship status: Not on file  . Intimate partner violence:    Fear of current or ex partner: Not on file    Emotionally abused: Not on file    Physically abused: Not on file    Forced sexual activity: Not on file  Other Topics Concern  . Not on file  Social History Narrative  . Not on file    Review of Systems: A complete ROS was negative except as per HPI.   Physical Exam: Blood pressure (!) 133/99, pulse 84, temperature 98.2 F (36.8 C), temperature source Oral, resp. rate 17, height 5\' 10"  (1.778 m), weight 61.2 kg, SpO2 100 %.  Physical Exam  Constitutional: He is oriented to person, place, and time and well-developed, well-nourished, and in no distress.  Cardiovascular: Normal rate, regular rhythm and normal heart sounds.  No murmur heard. Pulmonary/Chest: Effort normal and breath sounds normal. No respiratory distress. He has no wheezes.  Abdominal: Soft. Bowel sounds are normal. He exhibits no distension. There is no tenderness.  Musculoskeletal: He exhibits no edema.  Neurological: He is alert and oriented to person, place, and time.  Skin: Skin is warm and dry.    EKG: personally reviewed my interpretation is NSR  Assessment & Plan by Problem: Active Problems:   Suicidal behavior with attempted self-injury [facility])  [name] is a 27 year old male with past medical history significant for major depression with psychotic features presenting to the  ED after ingesting rat poison in a suicide attempt.   Suicide attempt Major Depressive Disorder Patient intentionally ingested rat poison in a suicide attempt yesterday around 2 PM.  He has denied any symptoms of nausea, vomiting, abdominal pain, signs or symptoms of bleeding. Pt is hemodynamically stable, CBC, CMP, rapid urine drug screen, ethanol, acetaminophen, salicylate, PT-INR, APTT are all within normal limits.  Patient reports that he stopped taking his medications because he wanted to try natural remedies. - Psych consult - f/u PT, INR and APTT at 24 hour and 72  hour post ingestion - Cardiac monitoring - Suicide precautions - f/u am cbc and cmp   Diet: Regular DVT prophylaxis: SCDs Full code Dispo: Admit patient to Inpatient with expected length of stay greater than 2 midnights.  SignedJaci Standard, DO 08/26/2018, 12:52 PM  Pager: [phone]

## 2018-08-26 NOTE — ED Notes (Signed)
Pt requested for a cup of ice water.  Cup of ice water was given.

## 2018-08-26 NOTE — ED Notes (Signed)
Pt arrived to [facility] - ambulatory - wearing burgundy scrubs. Sitter w/pt.

## 2018-08-26 NOTE — ED Triage Notes (Signed)
Pt states he ate rat poison 12 hours ago. States he ate "a little" with ice cream and then put some in a glass with water and drank the water.  Denies hallucinations.  States he wants to die.  Reports history of suicidal ideations.

## 2018-08-26 NOTE — ED Notes (Signed)
Pt continues to talk to himself.  Some of his thoughts are disorientated and incoherent.

## 2018-08-26 NOTE — ED Notes (Signed)
Pt given turkey sandwich and gingerale. 

## 2018-08-26 NOTE — ED Notes (Signed)
Pt stated that he is experiencing an upset stomach. He believes it is caused by the food he consumed for lunch. He shared that he will be using the restroom in the next few minutes or hour.

## 2018-08-26 NOTE — ED Notes (Addendum)
Patient given meal - consumed all items on tray.

## 2018-08-26 NOTE — ED Provider Notes (Signed)
Emergency Department Provider Note   I have reviewed the triage vital signs and the nursing notes.   HISTORY  Chief Complaint Suicidal and Ingestion   HPI Jacob [name] is a 27 y.o. male with PMH of depression with psychotic features presents to the ED for evaluation after intentional ingestion of "rat poison" in a suicide attempt.  States that 12 hours prior to ED evaluation he ingested 3 "balls" of the wrap poison.  His grandmother had placed him around because they have mice.  He picked these off the floor and ate them.  He denies other drugs or alcohol use.  He has been noncompliant with his psychiatry medications because he feels he is taking too many pills.  He is been feeling worsening depression and suicidal ideation.  He denies auditory or visual hallucinations.  Patient went to sleep after taking the poison and woke up concerned that he had ingested this and presented to the emergency department. Denies any HI.  Patient is not experiencing any medical symptoms.  Specifically he has not had any signs of bleeding either externally or in his bowel movements.  No vomiting or abdominal pain.  History reviewed. No pertinent past medical history.  Patient Active Problem List   Diagnosis Date Noted  . Suicidal behavior with attempted self-injury (HCC) 08/26/2018  . Severe recurrent major depression with psychotic features (HCC)   . Severe recurrent major depression without psychotic features (HCC) 02/04/2018    History reviewed. No pertinent surgical history.   Allergies Patient has no known allergies.  History reviewed. No pertinent family history.  Social History Social History   Tobacco Use  . Smoking status: Never Smoker  . Smokeless tobacco: Never Used  Substance Use Topics  . Alcohol use: No  . Drug use: Not Currently    Review of Systems  Constitutional: No fever/chills Eyes: No visual changes. ENT: No sore throat. Cardiovascular: Denies chest  pain. Respiratory: Denies shortness of breath. Gastrointestinal: No abdominal pain.  No nausea, no vomiting.  No diarrhea.  No constipation. Genitourinary: Negative for dysuria. Musculoskeletal: Negative for back pain. Skin: Negative for rash. Neurological: Negative for headaches, focal weakness or numbness. Psychiatric:Positive SI with intentional ingestion today.   10-point ROS otherwise negative.  ____________________________________________   PHYSICAL EXAM:  VITAL SIGNS: ED Triage Vitals [08/26/18 0533]  Enc Vitals Group     BP 134/65     Pulse Rate 78     Resp 16     Temp 98.2 F (36.8 C)     Temp Source Oral     SpO2 100 %     Weight 135 lb (61.2 kg)     Height 5\' 10"  (1.778 m)     Pain Score 0   Constitutional: Alert and oriented. Well appearing and in no acute distress. Eyes: Conjunctivae are normal.  Head: Atraumatic. Nose: No congestion/rhinnorhea. Mouth/Throat: Mucous membranes are moist.  Oropharynx non-erythematous. Neck: No stridor.   Cardiovascular: Normal rate, regular rhythm. Good peripheral circulation. Grossly normal heart sounds.   Respiratory: Normal respiratory effort.  No retractions. Lungs CTAB. Gastrointestinal: Soft and nontender. No distention.  Musculoskeletal: No lower extremity tenderness nor edema. No gross deformities of extremities. Neurologic:  Normal speech and language. No gross focal neurologic deficits are appreciated.  Skin:  Skin is warm, dry and intact. No rash noted.  ____________________________________________   LABS (all labs ordered are listed, but only abnormal results are displayed)  Labs Reviewed  COMPREHENSIVE METABOLIC PANEL - Abnormal; Notable for the  following components:      Result Value   Glucose, Bld 145 (*)    All other components within normal limits  ACETAMINOPHEN LEVEL - Abnormal; Notable for the following components:   Acetaminophen (Tylenol), Serum <10 (*)    All other components within normal  limits  CBG MONITORING, ED - Abnormal; Notable for the following components:   Glucose-Capillary 163 (*)    All other components within normal limits  ETHANOL  SALICYLATE LEVEL  CBC  RAPID URINE DRUG SCREEN, HOSP PERFORMED  PROTIME-INR  APTT  PROTIME-INR  APTT  HIV ANTIBODY (ROUTINE TESTING W REFLEX)  BASIC METABOLIC PANEL  CBC   ____________________________________________  EKG   EKG Interpretation  Date/Time:  Saturday August 26 2018 08:27:48 EDT Ventricular Rate:  69 PR Interval:  134 QRS Duration: 92 QT Interval:  374 QTC Calculation: 400 R Axis:   86 Text Interpretation:  Normal sinus rhythm Normal ECG No old tracing to compare Confirmed by Eber Hong ([id number]) on 08/26/2018 8:35:32 AM       ____________________________________________  RADIOLOGY  None ____________________________________________   PROCEDURES  Procedure(s) performed:   Procedures  None ____________________________________________   INITIAL IMPRESSION / ASSESSMENT AND PLAN / ED COURSE  Pertinent labs & imaging results that were available during my care of the patient were reviewed by me and considered in my medical decision making (see chart for details).  Patient presents to the emergency department after intentionally ingesting rat poison which is on the floor.  Grandmother is at bedside but cannot recall the exact name of the poison.  She is returning home to retrieve the box.  Labs are pending and then will contact poison control.  Patient is hemodynamically stable and well-appearing with no clear medical symptoms at this time.  10:04 AM Patient ingested a Jenniferann Stuckert-acting anticoagulant, Diphacinone (Ramik). [place] poison control recommending 24 hour and 72 hour post ingestion coagulants. Labs here are unremarkable.   Inpatient TTS evaluation to be ordered by inpatient team.   Discussed patient's case with internal medicine teaching service to request admission. Patient and family (if  present) updated with plan. Care transferred to medicine service.  I reviewed all nursing notes, vitals, pertinent old records, EKGs, labs, imaging (as available).  ____________________________________________  FINAL CLINICAL IMPRESSION(S) / ED DIAGNOSES  Final diagnoses:  Suicide attempt (HCC)  Ingestion of toxin, intentional self-harm, initial encounter (HCC)     MEDICATIONS GIVEN DURING THIS VISIT:  Medications  acetaminophen (TYLENOL) tablet 650 mg (has no administration in time range)    Or  acetaminophen (TYLENOL) suppository 650 mg (has no administration in time range)  senna-docusate (Senokot-S) tablet 1 tablet (has no administration in time range)  ondansetron (ZOFRAN) tablet 4 mg (has no administration in time range)  famotidine (PEPCID) tablet 20 mg (20 mg Oral Given 08/26/18 1754)     Note:  This document was prepared using Dragon voice recognition software and may include unintentional dictation errors.  Alona Bene, MD Emergency Medicine    Haven Pylant, Arlyss Repress, MD 08/26/18 [phone]

## 2018-08-27 DIAGNOSIS — T1491XA Suicide attempt, initial encounter: Secondary | ICD-10-CM

## 2018-08-27 DIAGNOSIS — F333 Major depressive disorder, recurrent, severe with psychotic symptoms: Secondary | ICD-10-CM

## 2018-08-27 DIAGNOSIS — F419 Anxiety disorder, unspecified: Secondary | ICD-10-CM

## 2018-08-27 DIAGNOSIS — F1290 Cannabis use, unspecified, uncomplicated: Secondary | ICD-10-CM

## 2018-08-27 DIAGNOSIS — F332 Major depressive disorder, recurrent severe without psychotic features: Secondary | ICD-10-CM

## 2018-08-27 DIAGNOSIS — T604X2A Toxic effect of rodenticides, intentional self-harm, initial encounter: Principal | ICD-10-CM

## 2018-08-27 LAB — CBC
HCT: 47.7 % (ref 39.0–52.0)
Hemoglobin: 15.8 g/dL (ref 13.0–17.0)
MCH: 28.8 pg (ref 26.0–34.0)
MCHC: 33.1 g/dL (ref 30.0–36.0)
MCV: 86.9 fL (ref 80.0–100.0)
Platelets: 309 10*3/uL (ref 150–400)
RBC: 5.49 MIL/uL (ref 4.22–5.81)
RDW: 11.9 % (ref 11.5–15.5)
WBC: 6.5 10*3/uL (ref 4.0–10.5)
nRBC: 0 % (ref 0.0–0.2)

## 2018-08-27 LAB — BASIC METABOLIC PANEL
Anion gap: 16 — ABNORMAL HIGH (ref 5–15)
BUN: 12 mg/dL (ref 6–20)
CO2: 28 mmol/L (ref 22–32)
Calcium: 10.6 mg/dL — ABNORMAL HIGH (ref 8.9–10.3)
Chloride: 96 mmol/L — ABNORMAL LOW (ref 98–111)
Creatinine, Ser: 1.16 mg/dL (ref 0.61–1.24)
GFR calc Af Amer: >60 mL/min (ref >60)
GFR calc non Af Amer: >60 mL/min (ref >60)
Glucose, Bld: 124 mg/dL — ABNORMAL HIGH (ref 70–99)
Potassium: 4 mmol/L (ref 3.5–5.1)
Sodium: 140 mmol/L (ref 135–145)

## 2018-08-27 MED ORDER — MUSCLE RUB 10-15 % EX CREA
TOPICAL_CREAM | CUTANEOUS | Status: DC | PRN
  Filled 2018-08-27: qty 85

## 2018-08-27 MED ORDER — FLUOXETINE HCL 10 MG PO CAPS
10.0000 mg | ORAL_CAPSULE | Freq: Every day | ORAL | Status: DC
  Administered 2018-08-27 – 2018-08-28 (×2): 10 mg via ORAL
  Filled 2018-08-27 (×2): qty 1

## 2018-08-27 MED ORDER — HYDROXYZINE HCL 25 MG PO TABS
25.0000 mg | ORAL_TABLET | Freq: Every day | ORAL | Status: DC
  Filled 2018-08-27: qty 1

## 2018-08-27 NOTE — Progress Notes (Addendum)
   Subjective: Jacob [name] reported feeling well this morning. He denied abdominal pain, nausea, vomiting, and bleeding.  He said he had a bowel movement yesterday that was "clumpy ".  Denied any blood in stool.  He denied any suicidal ideations or plans at this time.  He said he did not want to leave and felt better staying here. Pt requested to speak to a psychiatrist.   Objective:  Vital signs in last 24 hours: Vitals:   08/26/18 1603 08/26/18 1955 08/27/18 0354 08/27/18 0845  BP: (!) 144/88 (!) 140/92 128/88 125/75  Pulse: 60 71 64 67  Resp: 17 18 18 20   Temp: 98 F (36.7 C) 97.8 F (36.6 C) 97.8 F (36.6 C) 98.3 F (36.8 C)  TempSrc: Oral Oral Oral Oral  SpO2: 99% 98% 100% 100%  Weight:      Height:       Physical Exam  Constitutional: He is oriented to person, place, and time and well-developed, well-nourished, and in no distress.  Cardiovascular: Normal rate, regular rhythm and normal heart sounds.  No murmur heard. Pulmonary/Chest: Effort normal and breath sounds normal. No respiratory distress. He has no wheezes.  Abdominal: Soft. Bowel sounds are normal. He exhibits no distension.  Musculoskeletal: He exhibits no edema.  Neurological: He is alert and oriented to person, place, and time.  Skin: Skin is warm and dry.    Assessment/Plan:  Active Problems:   Suicidal behavior with attempted self-injury [facility])  Jacob [name] is a 27 year old male with past medical history significant for major depression with psychotic features presenting to the ED after ingesting rat poison in a suicide attempt.   Suicide attempt Major Depressive Disorder - Pt remains hemodynamically stable with no s/sx of bleeding, abdominal pain, or n/v  - CBC, CMP, APTT, PT and INR are all wnl - Poison control recommends to f/u PT, INR and APTT at 72 hour post ingestion - f/u PT, INR and aPTT tomorrow morning - Psych consult; patient requested to talk to a psychiatrist  - d/c cardiac monitoring -  Suicide precautions   Dispo: Patient is medically cleared for discharged pending psych evaluation  Jaci Standard, DO 08/27/2018, 11:44 AM Pager: [phone]

## 2018-08-27 NOTE — Progress Notes (Signed)
Pt was making sexual comments and masturbating in front of sitter. AC was made aware and a male nurse and sitter will be provided for the pt.  Larey Days, RN

## 2018-08-27 NOTE — Consult Note (Signed)
[facility] Consult   Reason for Consult:  Suicide attempt, ingested rat poison on friday Referring Physician: Dr. Lynnae January Patient Identification: Jacob [name] MRN:  [id number] Principal Diagnosis: Severe recurrent major depression without psychotic features [facility]) Diagnosis:   Patient Active Problem List   Diagnosis Date Noted  . Suicidal behavior with attempted self-injury ([place]) [T14.91XA] 08/26/2018  . Severe recurrent major depression with psychotic features ([place]) [F33.3]   . Severe recurrent major depression without psychotic features ([place]) [F33.2] 02/04/2018    Total Time spent with patient: 1 hour  Subjective:   Jacob [name] is a 27 y.o. male patient admitted after suicide attempt.  HPI: Patient with history of MDD and suicide, diagnosed in April, 2019. He was admitted yesterday after he attempted suicide by intentionally ingested rat poison. Patient was interviewed with his grandmother at his bedside. Patient reports that he has been stressed out and overwhelmed due to legal issues, financial problem and lack of access to his 63 year old daughter who is in a foster home in [place]. He reports having many pending charges and struggle to stay positive. Grandmother reports that patient is always anxious, easily overwhelmed and has negative thoughts about everything. He was admitted to [facility] in April,2019 but did not comply with follow up care or medication management. Patient denies psychosis, delusional thinking but unable to contract for safety.  Past Psychiatric History: MDD, Cannabis use disorder  Risk to Self:  yes Risk to Others:  denies Prior Inpatient Therapy:  yes, in April, 2019 Prior Outpatient Therapy:   none  Past Medical History: History reviewed. No pertinent past medical history. History reviewed. No pertinent surgical history. Family History: History reviewed. No pertinent family history. Family Psychiatric  History:  Social History:  Social  History   Substance and Sexual Activity  Alcohol Use No     Social History   Substance and Sexual Activity  Drug Use Not Currently    Social History   Socioeconomic History  . Marital status: Single    Spouse name: Not on file  . Number of children: Not on file  . Years of education: Not on file  . Highest education level: Not on file  Occupational History  . Not on file  Social Needs  . Financial resource strain: Not on file  . Food insecurity:    Worry: Not on file    Inability: Not on file  . Transportation needs:    Medical: Not on file    Non-medical: Not on file  Tobacco Use  . Smoking status: Never Smoker  . Smokeless tobacco: Never Used  Substance and Sexual Activity  . Alcohol use: No  . Drug use: Not Currently  . Sexual activity: Not on file  Lifestyle  . Physical activity:    Days per week: Not on file    Minutes per session: Not on file  . Stress: Not on file  Relationships  . Social connections:    Talks on phone: Not on file    Gets together: Not on file    Attends religious service: Not on file    Active member of club or organization: Not on file    Attends meetings of clubs or organizations: Not on file    Relationship status: Not on file  Other Topics Concern  . Not on file  Social History Narrative  . Not on file   Additional Social History:    Allergies:  No Known Allergies  Labs:  Results for  orders placed or performed during the hospital encounter of 08/26/18 (from the past 48 hour(s))  Rapid urine drug screen (hospital performed)     Status: None   Collection Time: 08/26/18  5:40 AM  Result Value Ref Range   Opiates NONE DETECTED NONE DETECTED   Cocaine NONE DETECTED NONE DETECTED   Benzodiazepines NONE DETECTED NONE DETECTED   Amphetamines NONE DETECTED NONE DETECTED   Tetrahydrocannabinol NONE DETECTED NONE DETECTED   Barbiturates NONE DETECTED NONE DETECTED    Comment: (NOTE) DRUG SCREEN FOR MEDICAL PURPOSES ONLY.  IF  CONFIRMATION IS NEEDED FOR ANY PURPOSE, NOTIFY LAB WITHIN 5 DAYS. LOWEST DETECTABLE LIMITS FOR URINE DRUG SCREEN Drug Class                     Cutoff (ng/mL) Amphetamine and metabolites    1000 Barbiturate and metabolites    200 Benzodiazepine                 482 Tricyclics and metabolites     300 Opiates and metabolites        300 Cocaine and metabolites        300 THC                            50 Performed at [facility], [place] [address], [place], [place] [address]   Comprehensive metabolic panel     Status: Abnormal   Collection Time: 08/26/18  5:43 AM  Result Value Ref Range   Sodium 138 135 - 145 mmol/L   Potassium 3.7 3.5 - 5.1 mmol/L   Chloride 101 98 - 111 mmol/L   CO2 28 22 - 32 mmol/L   Glucose, Bld 145 (H) 70 - 99 mg/dL   BUN 6 6 - 20 mg/dL   Creatinine, Ser 1.12 0.61 - 1.24 mg/dL   Calcium 9.7 8.9 - 10.3 mg/dL   Total Protein 6.9 6.5 - 8.1 g/dL   Albumin 4.2 3.5 - 5.0 g/dL   AST 15 15 - 41 U/L   ALT 16 0 - 44 U/L   Alkaline Phosphatase 58 38 - 126 U/L   Total Bilirubin 1.2 0.3 - 1.2 mg/dL   GFR calc non Af Amer >60 >60 mL/min   GFR calc Af Amer >60 >60 mL/min    Comment: (NOTE) The eGFR has been calculated using the CKD EPI equation. This calculation has not been validated in all clinical situations. eGFR's persistently <60 mL/min signify possible Chronic Kidney Disease.    Anion gap 9 5 - 15    Comment: Performed at [facility] [address], [place], [place] [address]  cbc     Status: None   Collection Time: 08/26/18  5:43 AM  Result Value Ref Range   WBC 5.2 4.0 - 10.5 K/uL   RBC 4.79 4.22 - 5.81 MIL/uL   Hemoglobin 13.4 13.0 - 17.0 g/dL   HCT 42.5 39.0 - 52.0 %   MCV 88.7 80.0 - 100.0 fL   MCH 28.0 26.0 - 34.0 pg   MCHC 31.5 30.0 - 36.0 g/dL   RDW 12.2 11.5 - 15.5 %   Platelets 310 150 - 400 K/uL   nRBC 0.0 0.0 - 0.2 %    Comment: Performed at [facility], [place] [address] [place], [place] [address]  Ethanol     Status:  None   Collection Time: 08/26/18  5:44 AM  Result Value Ref Range   Alcohol, Ethyl (B) <10 <10 mg/dL    Comment: (NOTE) Lowest detectable limit for serum alcohol is 10 mg/dL. For medical purposes only. Performed at [facility], [place] [address], [place], [place] [address]   Salicylate level     Status: None   Collection Time: 08/26/18  5:44 AM  Result Value Ref Range   Salicylate Lvl <8.9 2.8 - 30.0 mg/dL    Comment: Performed at [facility] [address] [place], [place] [address]  Acetaminophen level     Status: Abnormal   Collection Time: 08/26/18  5:44 AM  Result Value Ref Range   Acetaminophen (Tylenol), Serum <10 (L) 10 - 30 ug/mL    Comment: (NOTE) Therapeutic concentrations vary significantly. A range of 10-30 ug/mL  may be an effective concentration for many patients. However, some  are best treated at concentrations outside of this range. Acetaminophen concentrations >150 ug/mL at 4 hours after ingestion  and >50 ug/mL at 12 hours after ingestion are often associated with  toxic reactions. Performed at [facility], [place] [address] [place], [place] [address]   Protime-INR     Status: None   Collection Time: 08/26/18  8:32 AM  Result Value Ref Range   Prothrombin Time 13.3 11.4 - 15.2 seconds   INR 1.02     Comment: Performed at [facility] [name], [place] [address]  APTT     Status: None   Collection Time: 08/26/18  8:32 AM  Result Value Ref Range   aPTT 29 24 - 36 seconds    Comment: Performed at [facility] [address] [place] [address], [place], [place] [address]  CBG monitoring, ED     Status: Abnormal   Collection Time: 08/26/18  9:02 AM  Result Value Ref Range   Glucose-Capillary 163 (H) 70 - 99 mg/dL  Protime-INR     Status: None   Collection Time: 08/26/18  5:14 PM  Result Value Ref Range   Prothrombin Time 13.9 11.4 - 15.2 seconds   INR 1.08     Comment: Performed at [facility], [place] [address]  [place], [place] [address]  APTT     Status: None   Collection Time: 08/26/18  5:14 PM  Result Value Ref Range   aPTT 29 24 - 36 seconds    Comment: Performed at [facility] [address], [place], [place] [address]  Basic metabolic panel     Status: Abnormal   Collection Time: 08/27/18  5:54 AM  Result Value Ref Range   Sodium 140 135 - 145 mmol/L   Potassium 4.0 3.5 - 5.1 mmol/L   Chloride 96 (L) 98 - 111 mmol/L   CO2 28 22 - 32 mmol/L   Glucose, Bld 124 (H) 70 - 99 mg/dL   BUN 12 6 - 20 mg/dL   Creatinine, Ser 1.16 0.61 - 1.24 mg/dL   Calcium 10.6 (H) 8.9 - 10.3 mg/dL   GFR calc non Af Amer >60 >60 mL/min   GFR calc Af Amer >60 >60 mL/min    Comment: (NOTE) The eGFR has been calculated using the CKD EPI equation. This calculation has not been validated in all clinical situations. eGFR's persistently <60 mL/min signify possible Chronic Kidney Disease.    Anion gap 16 (H) 5 - 15    Comment: Performed at [facility], [place] [address], [place], [place] [address]  CBC     Status: None   Collection Time: 08/27/18  5:54 AM  Result Value Ref Range   WBC 6.5 4.0 - 10.5 K/uL   RBC 5.49 4.22 - 5.81 MIL/uL   Hemoglobin 15.8 13.0 - 17.0 g/dL   HCT 47.7 39.0 - 52.0 %   MCV 86.9 80.0 - 100.0 fL   MCH 28.8 26.0 - 34.0 pg   MCHC 33.1 30.0 - 36.0 g/dL   RDW 11.9 11.5 - 15.5 %   Platelets 309 150 - 400 K/uL   nRBC 0.0 0.0 - 0.2 %    Comment: Performed at [facility], [place] [address] [place], [place] [address]    Current Facility-Administered Medications  Medication Dose Route Frequency Provider Last Rate Last Dose  . acetaminophen (TYLENOL) tablet 650 mg  650 mg Oral Q6H PRN Jacob Shan Ratliff, Jacob [name]       Or  . acetaminophen (TYLENOL) suppository 650 mg  650 mg Rectal Q6H PRN Jacob Shan Ratliff, Jacob [name]      . famotidine (PEPCID) tablet 20 mg  20 mg Oral Daily Jacob [name], Jacob [name], Jacob [name]   20 mg at 08/26/18 1754  . FLUoxetine (PROZAC) capsule 10 mg  10 mg Oral Daily  Jacob Dubberly, MD      . ondansetron (ZOFRAN) tablet 4 mg  4 mg Oral Q8H PRN Jacob [name], Jacob [name], Jacob [name]      . senna-docusate (Senokot-S) tablet 1 tablet  1 tablet Oral QHS PRN Jacob [name], Jacob Rafter, Jacob [name]        Musculoskeletal: Strength & Muscle Tone: within normal limits Gait & Station: normal Patient leans: [name]/A  Psychiatric Specialty Exam: Physical Exam  Psychiatric: His speech is normal. His mood appears anxious. He is slowed and withdrawn. Cognition and memory are normal. He expresses impulsivity. He exhibits a depressed mood. He expresses suicidal ideation.    Review of Systems  Constitutional: Negative.   HENT: Negative.   Eyes: Negative.   Respiratory: Negative.   Cardiovascular: Negative.   Gastrointestinal: Negative.   Genitourinary: Negative.   Musculoskeletal: Negative.   Skin: Negative.   Psychiatric/Behavioral: Positive for depression and suicidal ideas. The patient is nervous/anxious.     Blood pressure 125/75, pulse 67, temperature 98.3 F (36.8 C), temperature source Oral, resp. rate 20, height '5\' 10"'  (1.778 m), weight 61.2 kg, SpO2 100 %.Body mass index is 19.37 kg/m.  General Appearance: Casual  Eye Contact:  Good  Speech:  Clear and Coherent  Volume:  Decreased  Mood:  Anxious, Depressed and Dysphoric  Affect:  Constricted  Thought Process:  Coherent and Linear  Orientation:  Full (Time, Place, and Person)  Thought Content:  Logical  Suicidal Thoughts:  Yes.  without intent/plan  Homicidal Thoughts:  No  Memory:  Immediate;   Good Recent;   Good Remote;   Fair  Judgement:  Poor  Insight:  Shallow  Psychomotor Activity:  Psychomotor Retardation  Concentration:  Concentration: Fair and Attention Span: Fair  Recall:  Good  Fund of Knowledge:  Fair  Language:  Good  Akathisia:  No  Handed:  Right  AIMS (if indicated):     Assets:  Communication Skills Desire for Improvement  ADL's:  Intact  Cognition:  WNL  Sleep:   fair     Treatment Plan  Summary: 27 yo man with history of MDD and Cannabis use disorder in early remission who was admitted after he attempted suicide by ingesting rat poison intentionally. Patient is still depressed, stressed out,  overwhelmed and unable to contract for safety.  Recommendations: -Continue 1:1 sitter for safety -Consider Prozac 10 mg daily for depression, Hydroxyzine 25 mg at bedtime for anxiety/sleep - Consider social worker consult to facilitate inpatient psychiatric admission for stabilization. -Consider Involuntary commitment if patient refused inpatient psychiatric admission voluntarily; he is clearly a danger to himself. -Psychiatric service signing out. Re-consult psych as needed.   Disposition: Recommend psychiatric Inpatient admission when medically cleared. Supportive therapy provided about ongoing stressors.  Corena Pilgrim, MD 08/27/2018 2:11 PM

## 2018-08-27 NOTE — Progress Notes (Signed)
  Date: 08/27/2018  Patient name: Jacob [name]  Medical record number: [id number]  Date of birth: 11/08/1990   I have seen and evaluated Jacob [name] and discussed their care with the Residency Team. Jacob [name] is a 27 yo man with history of major depression with features who presented to the ED after ingesting rat poison intentionally in a suicide attempt.  The admitting team documented that he had an informed family members prior to ingesting the poison and his family called 911.  He had stopped his mental health medications because he wanted to try a natural remedies.  He was most recently admitted to the mental health unit in April of this year and was discharged on hydroxyzine, Abilify, sertraline, and trazodone.  This morning, he denies suicidal ideation and has a sitter in the room.  Vitals:   08/27/18 0354 08/27/18 0845  BP: 128/88 125/75  Pulse: 64 67  Resp: 18 20  Temp: 97.8 F (36.6 C) 98.3 F (36.8 C)  SpO2: 100% 100%  lying on couch in room, NAD Calm, speech and answers appropriate, good eye contact, appt mood and affect  Calcium 10.6  I personally viewed the EKG and confirmed my reading with the official read. Sinus, nl axis, LVH  Assessment and Plan: I have seen and evaluated the patient as outlined above. I agree with the formulated Assessment and Plan as detailed in the residents' note, with the following changes: Jacob [name] is a 27 yo man with history of major depression with features who presented to the ED after intentionally ingesting rat poison intentionally in a suicide attempt. Rodenticides topically are a variant of warfarin.  He is asymptomatic. His coags and all other blood work, with the exception of his calcium, have been normal.  The poison control has been involved but has signed off.  This time of ingestion was indicated as the 24th at 5 PM on initial interaction with the healthcare system but the admitting team was told he ingested it at 2 PM on the 25th.   Up-to-date recommends coags at 48 hours out.  Using the most recent interesting time, 48 hours would be about now.  Once we get the final coags, he will be medically cleared to be transferred to psychiatry if they feel this is indicated due to his active suicide attempt.  1.  Intentional suicide attempt with rat poison - check INR now.  If normal, he is medically stable to be transferred to mental health.  Poison control recommended a final INR 72 hours out which can be done at his next treatment continue.  We are awaiting psychiatry consult.  2.  Hypercalcemia - he will need repeat calcium testing although this does not need to be done in the inpatient setting acutely.  Jacob. [name] is medically stable and we are awaiting psychiatry consult.  Burns Spain, MD 10/27/20191:08 PM

## 2018-08-28 ENCOUNTER — Encounter (HOSPITAL_COMMUNITY): Payer: Self-pay

## 2018-08-28 ENCOUNTER — Inpatient Hospital Stay (HOSPITAL_COMMUNITY): Admission: AD | Admit: 2018-08-28 | Payer: BLUE CROSS/BLUE SHIELD | Source: Intra-hospital | Admitting: Psychiatry

## 2018-08-28 ENCOUNTER — Other Ambulatory Visit: Payer: Self-pay

## 2018-08-28 ENCOUNTER — Inpatient Hospital Stay (HOSPITAL_COMMUNITY)
Admission: AD | Admit: 2018-08-28 | Discharge: 2018-09-04 | DRG: 885 | Disposition: A | Discharge status: Home / Self Care | Payer: BLUE CROSS/BLUE SHIELD | Source: Intra-hospital | Attending: Psychiatry | Admitting: Psychiatry

## 2018-08-28 DIAGNOSIS — Z79899 Other long term (current) drug therapy: Secondary | ICD-10-CM

## 2018-08-28 DIAGNOSIS — F419 Anxiety disorder, unspecified: Secondary | ICD-10-CM | POA: Diagnosis present

## 2018-08-28 DIAGNOSIS — T604X2A Toxic effect of rodenticides, intentional self-harm, initial encounter: Secondary | ICD-10-CM | POA: Diagnosis present

## 2018-08-28 DIAGNOSIS — R451 Restlessness and agitation: Secondary | ICD-10-CM | POA: Diagnosis present

## 2018-08-28 DIAGNOSIS — T1491XA Suicide attempt, initial encounter: Secondary | ICD-10-CM | POA: Diagnosis not present

## 2018-08-28 DIAGNOSIS — G4700 Insomnia, unspecified: Secondary | ICD-10-CM | POA: Diagnosis present

## 2018-08-28 DIAGNOSIS — F332 Major depressive disorder, recurrent severe without psychotic features: Secondary | ICD-10-CM | POA: Diagnosis present

## 2018-08-28 DIAGNOSIS — F333 Major depressive disorder, recurrent, severe with psychotic symptoms: Principal | ICD-10-CM | POA: Diagnosis present

## 2018-08-28 LAB — PROTIME-INR
INR: 1.01
Prothrombin Time: 13.2 seconds (ref 11.4–15.2)

## 2018-08-28 LAB — APTT: aPTT: 30 seconds (ref 24–36)

## 2018-08-28 MED ORDER — ACETAMINOPHEN 325 MG PO TABS: 650.0000 mg | ORAL_TABLET | Freq: Four times a day (QID) | ORAL | Status: DC | PRN

## 2018-08-28 MED ORDER — FAMOTIDINE 20 MG PO TABS
20.0000 mg | ORAL_TABLET | Freq: Every day | ORAL | Status: DC
  Administered 2018-08-29 – 2018-09-04 (×7): 20 mg via ORAL
  Filled 2018-08-28 (×9): qty 1

## 2018-08-28 MED ORDER — HYDROXYZINE HCL 25 MG PO TABS: 25.0000 mg | ORAL_TABLET | Freq: Every day | ORAL | 0 refills | Status: DC

## 2018-08-28 MED ORDER — AMLODIPINE BESYLATE 5 MG PO TABS: 5.0000 mg | ORAL_TABLET | Freq: Every day | ORAL | 0 refills | Status: DC

## 2018-08-28 MED ORDER — TRAZODONE HCL 50 MG PO TABS
50.0000 mg | ORAL_TABLET | Freq: Every evening | ORAL | Status: DC | PRN
  Administered 2018-08-28 – 2018-09-03 (×6): 50 mg via ORAL
  Filled 2018-08-28 (×21): qty 1

## 2018-08-28 MED ORDER — MUSCLE RUB 10-15 % EX CREA: TOPICAL_CREAM | CUTANEOUS | Status: DC | PRN

## 2018-08-28 MED ORDER — ONDANSETRON HCL 4 MG PO TABS: 4.0000 mg | ORAL_TABLET | Freq: Three times a day (TID) | ORAL | Status: DC | PRN

## 2018-08-28 MED ORDER — FLUOXETINE HCL 10 MG PO CAPS: 10.0000 mg | ORAL_CAPSULE | Freq: Every day | ORAL | 0 refills | Status: DC

## 2018-08-28 MED ORDER — FLUOXETINE HCL 10 MG PO CAPS
10.0000 mg | ORAL_CAPSULE | Freq: Every day | ORAL | Status: DC
  Administered 2018-08-29 – 2018-08-30 (×2): 10 mg via ORAL
  Filled 2018-08-28 (×3): qty 1

## 2018-08-28 MED ORDER — SENNOSIDES-DOCUSATE SODIUM 8.6-50 MG PO TABS: 1.0000 | ORAL_TABLET | Freq: Every evening | ORAL | Status: DC | PRN

## 2018-08-28 MED ORDER — HYDROXYZINE HCL 25 MG PO TABS
25.0000 mg | ORAL_TABLET | Freq: Every day | ORAL | Status: DC
  Administered 2018-08-28: 25 mg via ORAL
  Filled 2018-08-28 (×4): qty 1

## 2018-08-28 NOTE — Clinical Social Work Note (Addendum)
CSW advised in morning progression that patient came to hospital due to an intentional overdose. Reviewed psychiatry note and inpatient psychiatric treatment recommended. CSW visited with patient and his grandmother at the bedside regarding recommendation. Patient concerned about the recommendation because he is employed and this was discussed. Patient agreeable to inpatient psychiatric placement and is willing to go voluntarily. Call made to [facility] and referral made and waiting to hear back from [place] regarding patient.  Regarding Mr. Krist's job, he was informed that a note could be provided informing his employer that he is currently hospitalized. Note completed and given to patient, along with a copy for his records.  Call received from [facility] with [facility]. They can take patient today; Room 504 -  Bed 2.  *Accepting MD - [place] *Accepting Psychiatrist - Dr. Daniel Nones. *Voluntary consent form signed by patient and faxed to [facility] Form will accompany   patient to [facility].  *[facility] contacted and transportation arranged for 4 pm.  Genelle Bal, MSW, LCSW Licensed Clinical Social Worker Clinical Social Work Department [employer] [phone]

## 2018-08-28 NOTE — Progress Notes (Signed)
Patient discharged to [facility]. No signs and symptoms of distress noted. Patient IV was removed. Report was given to [facility] . Norman Clay RN

## 2018-08-28 NOTE — Progress Notes (Signed)
Admission Note: Patient is a 27 year old male admitted to the unit with symptoms of anxiety, depression and suicidal thoughts.  Patient currently denies suicidal thoughts and verbally contracts for safety.  Patient presents with anxious affect/restless and fidgety upon approach.  Reports being overwhelmed with life issues.  States he is here to get help with the voices and suicidal thoughts.  Admission plan of care reviewed and consent signed.  Skin assessment completed.  Skin is dry and intact.  No personal belongings available during admission.  Patient oriented to the unit, staff and room.  Routine safety checks initiated.  Verbalizes understanding of unit rules and protocols.  Patient is safe on the unit.

## 2018-08-28 NOTE — Progress Notes (Signed)
   Subjective: Mr. Knotek reported feeling well this morning.  He denied any symptoms of abdominal pain, nausea, vomiting. No s/sx of bleeding or bruising. He denies any suicidal ideations at this time or plans to hurt anyone else.   Objective:  Vital signs in last 24 hours: Vitals:   08/27/18 0845 08/27/18 1721 08/27/18 2146 08/28/18 0608  BP: 125/75 (!) 139/95 122/80 116/80  Pulse: 67 80 73 72  Resp: 20 20 18 18   Temp: 98.3 F (36.8 C) 98.1 F (36.7 C) 98.2 F (36.8 C) 98.5 F (36.9 C)  TempSrc: Oral Oral Oral Oral  SpO2: 100% 100% 99% 100%  Weight:      Height:       Physical Exam  Constitutional: He is oriented to person, place, and time and well-developed, well-nourished, and in no distress.  Cardiovascular: Normal rate, regular rhythm and normal heart sounds.  No murmur heard. Pulmonary/Chest: Effort normal and breath sounds normal. No respiratory distress. He has no wheezes.  Abdominal: Soft. Bowel sounds are normal. He exhibits no distension. There is no tenderness.  Neurological: He is alert and oriented to person, place, and time.  Skin: Skin is warm and dry.    Assessment/Plan:  Principal Problem:   Severe recurrent major depression without psychotic features (HCC) Active Problems:   Suicidal behavior with attempted self-injury [facility])  Mr. Cates is a 27 year old male with past medical history significant for major depression with psychotic features presenting to the ED after ingesting rat poison in a suicide attempt.  Suicide attempt Major Depressive Disorder - Pt remains hemodynamically stable with no s/sx of bleeding, abdominal pain, or n/v  - 72 hour CBC, CMP, APTT, PT and INR are pending - Psych recommended psych inpatient admission  - started on fluoxetine 10mg  and hydroxyzine 25 mg  - social work consulted to help facilitate inpatient psych admission  -Suicide precautions  Dispo: Patient is medically cleared for inpatient psych admission.  Jaci Standard, DO 08/28/2018, 6:55 AM Pager: [phone]

## 2018-08-28 NOTE — Discharge Summary (Signed)
Name: Jacob [name] MRN: [id number] DOB: 01/14/1991 27 y.o. PCP: Patient, No Pcp Per  Date of Admission: 08/26/2018  5:20 AM Date of Discharge: 08/28/2018 Attending Physician: Burns Spain, MD  Discharge Diagnosis: 1.  Suicide attempt with rat poison 2.  Hypercalcemia  Discharge Medications: Allergies as of 08/28/2018   No Known Allergies     Medication List    STOP taking these medications   ARIPiprazole 10 MG tablet Commonly known as:  ABILIFY   sertraline 50 MG tablet Commonly known as:  ZOLOFT   traZODone 50 MG tablet Commonly known as:  DESYREL     TAKE these medications   amLODipine 5 MG tablet Commonly known as:  NORVASC Take 1 tablet (5 mg total) by mouth daily. For high blood pressure. Restart if needed What changed:  additional instructions   FLUoxetine 10 MG capsule Commonly known as:  PROZAC Take 1 capsule (10 mg total) by mouth daily. Start taking on:  08/29/2018   hydrOXYzine 25 MG tablet Commonly known as:  ATARAX/VISTARIL Take 1 tablet (25 mg total) by mouth at bedtime. What changed:    when to take this  reasons to take this       Disposition and follow-up:   [name] was discharged from [facility] in Stable condition.  At the hospital follow up visit please address:  1.  Suicidal ideations- please assess patient's mental status and compliance with antidepressants and anti-anxiolytics  2.  Hypercalcemia- patient found to have a calcium of 10.6, needs repeat calcium testing  2.  Labs / imaging needed at time of follow-up: Ca  3.  Pending labs/ test needing follow-up: none  Follow-up Appointments:   Pt being admitted to a psychiatric facility.    Hospital Course by problem list: 1.  Suicide attempt with rat poison-presented to the ED after ingesting rat poison in a suicide attempt.  He reported on 10/25 around 2 PM he mixed 3 balls of wrap poison into his ice cream.  He stated he let them dissolve  before eating the ice cream.  He then mixed some rat poison with a glass of water which he drink.  He stated he did not "really digest" any rat poison.  He denied any sometimes after ingesting poison such as abdominal pain, nausea, vomiting, diarrhea or bleeding.  He stated he felt fine after ingesting the poison and continued on with his day. He denied any auditory or visual hallucinations.  He did state he sees things but its "reality".  He denied any suicidal ideations during his hospital stay and said he had no thoughts of harming or killing anyone else.  He reported he has not been taking any medications. Per [place] poison control, PT, APTT and INR were checked at 24 and 72 hours and were within normal limits. He was medically cleared for psychiatric inpatient admission. Started on Prozac 10 mg and Hydroxyzine 25 mg per psych.   Discharge Vitals:   BP (!) 134/92 (BP Location: Left Arm)   Pulse 79   Temp 97.9 F (36.6 C) (Oral)   Resp 19   Ht 5\' 10"  (1.778 m)   Wt 61.2 kg   SpO2 100%   BMI 19.37 kg/m   Pertinent Labs, Studies, and Procedures:   CBC Latest Ref Rng & Units 08/27/2018 08/26/2018 02/03/2018  WBC 4.0 - 10.5 K/uL 6.5 5.2 5.4  Hemoglobin 13.0 - 17.0 g/dL 40.9 81.1 91.4  Hematocrit 39.0 - 52.0 % 47.7 42.5 44.8  Platelets 150 - 400 K/uL 309 310 310   CMP Latest Ref Rng & Units 08/27/2018 08/26/2018 02/03/2018  Glucose 70 - 99 mg/dL 454(U) 981(X) 914(N)  BUN 6 - 20 mg/dL 12 6 16   Creatinine 0.61 - 1.24 mg/dL 8.29 5.62 1.30  Sodium 135 - 145 mmol/L 140 138 139  Potassium 3.5 - 5.1 mmol/L 4.0 3.7 4.2  Chloride 98 - 111 mmol/L 96(L) 101 103  CO2 22 - 32 mmol/L 28 28 26   Calcium 8.9 - 10.3 mg/dL 10.6(H) 9.7 9.8  Total Protein 6.5 - 8.1 g/dL - 6.9 6.9  Total Bilirubin 0.3 - 1.2 mg/dL - 1.2 0.4  Alkaline Phos 38 - 126 U/L - 58 65  AST 15 - 41 U/L - 15 39  ALT 0 - 44 U/L - 16 74(H)    Discharge Instructions: Discharge Instructions    Diet - low sodium heart healthy   Complete by:   As directed    Discharge instructions   Complete by:  As directed    [name],  Please start taking Prozac 10 mg once a day and Hyrdoxyzine 25 mg once day at bedtime.   Increase activity slowly   Complete by:  As directed       Signed: Allen Basista, Callie Fielding, DO 08/28/2018, 1:34 PM   Pager: [phone]

## 2018-08-28 NOTE — Progress Notes (Signed)
D: Pt denies SI/HI/AVH . Pt is pleasant and cooperative. Pt pt visible on the unit playing cards with his peers, pt presents paranoid and appears to be responding to internal stimuli even though he denies.   A: Pt was offered support and encouragement. Pt was given scheduled medications. Pt was encourage to attend groups. Q 15 minute checks were done for safety.   R:Pt attends groups and interacts well with peers and staff. Pt is taking medication. Pt has no complaints.Pt receptive to treatment and safety maintained on unit.   Problem: Coping: Goal: Ability to identify and develop effective coping behavior will improve Outcome: Progressing   Problem: Self-Concept: Goal: Ability to disclose and discuss suicidal ideas will improve Outcome: Progressing

## 2018-08-28 NOTE — Tx Team (Signed)
Initial Treatment Plan 08/28/2018 5:53 PM Jacob [name] ZOX:[id number]    PATIENT STRESSORS: Health problems Marital or family conflict   PATIENT STRENGTHS: Average or above average intelligence Supportive family/friends   PATIENT IDENTIFIED PROBLEMS: "speaking about my problems"  "socializing"  Anxiety  Depression  Suicidal Ideation             DISCHARGE CRITERIA:  Adequate post-discharge living arrangements Motivation to continue treatment in a less acute level of care  PRELIMINARY DISCHARGE PLAN: Outpatient therapy Return to previous living arrangement  PATIENT/FAMILY INVOLVEMENT: This treatment plan has been presented to and reviewed with the patient, Jacob [name], and/or family member.  The patient and family have been given the opportunity to ask questions and make suggestions.  Clarene Critchley, RN 08/28/2018, 5:53 PM

## 2018-08-29 DIAGNOSIS — T1491XA Suicide attempt, initial encounter: Secondary | ICD-10-CM

## 2018-08-29 DIAGNOSIS — T604X2A Toxic effect of rodenticides, intentional self-harm, initial encounter: Secondary | ICD-10-CM

## 2018-08-29 DIAGNOSIS — F332 Major depressive disorder, recurrent severe without psychotic features: Secondary | ICD-10-CM

## 2018-08-29 LAB — HIV ANTIBODY (ROUTINE TESTING W REFLEX): HIV Screen 4th Generation wRfx: NONREACTIVE

## 2018-08-29 MED ORDER — HYDROXYZINE HCL 50 MG PO TABS
50.0000 mg | ORAL_TABLET | Freq: Four times a day (QID) | ORAL | Status: DC | PRN
  Administered 2018-08-30 – 2018-09-01 (×3): 50 mg via ORAL
  Filled 2018-08-29 (×4): qty 1

## 2018-08-29 MED ORDER — OLANZAPINE 5 MG PO TBDP: 5.0000 mg | ORAL_TABLET | Freq: Three times a day (TID) | ORAL | Status: DC | PRN

## 2018-08-29 MED ORDER — ARIPIPRAZOLE 10 MG PO TABS
10.0000 mg | ORAL_TABLET | Freq: Every day | ORAL | Status: DC
  Administered 2018-08-29 – 2018-08-31 (×3): 10 mg via ORAL
  Filled 2018-08-29 (×5): qty 1

## 2018-08-29 MED ORDER — LORAZEPAM 2 MG/ML IJ SOLN: 1.0000 mg | Freq: Four times a day (QID) | INTRAMUSCULAR | Status: DC | PRN

## 2018-08-29 MED ORDER — ZIPRASIDONE MESYLATE 20 MG IM SOLR: 20.0000 mg | Freq: Two times a day (BID) | INTRAMUSCULAR | Status: DC | PRN

## 2018-08-29 MED ORDER — LORAZEPAM 1 MG PO TABS: 1.0000 mg | ORAL_TABLET | Freq: Four times a day (QID) | ORAL | Status: DC | PRN
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
## 2018-08-29 NOTE — Progress Notes (Signed)
Recreation Therapy Notes  Date: 10.29.19 Time: 1000 Location: 500 Hall Dayroom  Group Topic: Leisure Education  Goal Area(s) Addresses:  Patient will identify positive leisure activities.  Patient will identify one positive benefit of participation in leisure activities.   Behavioral Response: Engaged  Intervention: [employer], dry erase marker, eraser, various leisure activities  Activity: Pictionary. Patients were given the opportunity to pull a leisure activity from the container.  Patients were to then draw the activity on the board.  The remaining patients were to guess what the activity was.  The person that correctly guesses the activity gets the next turn.   Education:  Leisure Education, Building control surveyor  Education Outcome: Acknowledges education/In group clarification offered/Needs additional education  Clinical Observations/Feedback:  Pt was bright and engaged in group.  Pt was social with peers and smiling.  Pt seemed to be in good spirits.  Pt was pleasant and very active during group session.    Caroll Rancher, LRT/CTRS      Caroll Rancher A 08/29/2018 12:26 PM

## 2018-08-29 NOTE — BHH Suicide Risk Assessment (Signed)
[facility] Admission Suicide Risk Assessment   Nursing information obtained from:  Patient Demographic factors:  Male Current Mental Status:  Self-harm thoughts Loss Factors:  Financial problems / change in socioeconomic status Historical Factors:  Prior suicide attempts Risk Reduction Factors:  Living with another person, especially a relative  Total Time spent with patient: 1 hour Principal Problem: Severe recurrent major depression without psychotic features (HCC) Diagnosis:   Patient Active Problem List   Diagnosis Date Noted  . Suicidal behavior with attempted self-injury (HCC) [T14.91XA] 08/26/2018  . 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 Severe recurrent major depression without psychotic features (HCC) [F33.2] 02/04/2018   Subjective Data: see H&P  Continued Clinical Symptoms:    The "Alcohol Use Disorders Identification Test", Guidelines for Use in Primary Care, Second Edition.  World Science writer [facility]). Score between 0-7:  no or low risk or alcohol related problems. Score between 8-15:  moderate risk of alcohol related problems. Score between 16-19:  high risk of alcohol related problems. Score 20 or above:  warrants further diagnostic evaluation for alcohol dependence and treatment.   CLINICAL FACTORS:   Severe Anxiety and/or Agitation Depression:   Severe More than one psychiatric diagnosis    Psychiatric Specialty Exam: Physical Exam  Nursing note and vitals reviewed.     Blood pressure 130/90, pulse 99, temperature 98.2 F (36.8 C), temperature source Oral, resp. rate 18, height 5\' 10"  (1.778 m), weight 57.2 kg, SpO2 100 %.Body mass index is 18.08 kg/m.     COGNITIVE FEATURES THAT CONTRIBUTE TO RISK:  None    SUICIDE RISK:   Moderate:  Frequent suicidal ideation with limited intensity, and duration, some specificity in terms of plans, no associated intent, good self-control, limited dysphoria/symptomatology, some  risk factors present, and identifiable protective factors, including available and accessible social support.  PLAN OF CARE: see H&P  I certify that inpatient services furnished can reasonably be expected to improve the patient's condition.   Micheal Likens, MD 08/29/2018, 3:45 PM

## 2018-08-29 NOTE — Progress Notes (Signed)
Recreation Therapy Notes  INPATIENT RECREATION THERAPY ASSESSMENT  Patient Details Name: Jacob [name] MRN: [id number] DOB: 1991/09/29 Today's Date: 08/29/2018       Information Obtained From: Patient  Able to Participate in Assessment/Interview: Yes  Patient Presentation: Alert  Reason for Admission (Per Patient): Other (Comments)(Mental lapse)  Patient Stressors: Family, Work, Other (Comment)("Trying to take myself out of the situation")  Coping Skills:   Film/video editor, Counselling psychologist, Music, Exercise, Talk, Avoidance, Read  Leisure Interests (2+):  Community - Movies, Garment/textile technologist - Other (Comment)(Skating; Medical sales representative)  Frequency of Recreation/Participation: Other (Comment)(Every now and then)  Awareness of Community Resources:  Yes  Community Resources:  Research scientist (physical sciences), [employer], Other (Comment)(Stores)  Current Use: Yes  If no, Barriers?:    Expressed Interest in [employer] Information: No  [employer] of Residence:  [place]  Patient Main Form of Transportation: Set designer  Patient Strengths:  Communication; Making people laugh  Patient Identified Areas of Improvement:  Understand when to stop negative thinking  Patient Goal for Hospitalization:  "Maintain positive outlook on self and others".  Current SI (including self-harm):  No  Current HI:  No  Current AVH: No  Staff Intervention Plan: Group Attendance, Collaborate with Interdisciplinary Treatment Team  Consent to Intern Participation: N/A    Jacob [name], LRT/CTRS  Lillia Abed, Mianna Iezzi A 08/29/2018, 1:32 PM

## 2018-08-29 NOTE — H&P (Signed)
Psychiatric Admission Assessment Adult  Patient Identification: Jacob [name] MRN:  [id number] Date of Evaluation:  08/29/2018 Chief Complaint:  mdd with psychotic features Principal Diagnosis: MDD (major depressive disorder), recurrent, severe, with psychosis (HCC) Diagnosis:   Patient Active Problem List   Diagnosis Date Noted  . MDD (major depressive disorder), recurrent, severe, with psychosis (HCC) [F33.3] 08/28/2018  . Suicidal behavior with attempted self-injury (HCC) [T14.91XA] 08/26/2018  . 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 Severe recurrent major depression without psychotic features (HCC) [F33.2] 02/04/2018   History of Present Illness:   Jacob [name] is a 27 y/o M with history of MDD without psychotic features who was admitted voluntarily from inpatient medicine unit where he was admitted and medically stabilized after presenting to the ED with suicide attempt via ingestion of rat poison. Pt has relevant history of admission to [facility] in April 2019 for symptoms of depression but he did not follow through with outpatient follow up. After medical clearance, pt was transferred to [facility] for additional treatment and stabilization.  Upon initial interview, pt shares, "I just have an unstable mindset. I'm trying to stay on the path. I'm not really controlling my stability. It's kind of hard to adapt." Pt is generally vague and circumstantial in responses, but he is pleasant and cooperative with interview, and he is able to be re-directed to give more precise responses. He describes worsening depression associated with multiple stressors of upcoming court hearing for charge of DUI and having difficulty accessing his daughter (age 38.5) whom is in foster care in [place]. Pt reports that his actions of consuming rat poison was indeed an attempt to end his life, but he explains that it was impulsive and he regrets his actions. He endorses depression symptoms of  depressed mood, anhedonia, guilty feelings, fluctuant energy, and poor concentration. He denies SI/HI/AH/VH. He had reported "seeing things" while in the ED as per notes from inpatient treatment team, but he denies all history of psychotic symptoms at this time. He denies symptoms of mania/hypomania, OCD, and PTSD. He drinks 1 beer monthly and he denies all other illicit substance use including tobacco.   Discussed with patient about treatment options. He has already been started on prozac for treatment of depression and anxiety, and we discussed addition of abilify to help with mood swings, impulsivity, and to augment efficacy of prozac. Pt was in agreement with that plan, and he had no further questions, comments, or concerns.  Associated Signs/Symptoms: Depression Symptoms:  depressed mood, anhedonia, insomnia, fatigue, feelings of worthlessness/guilt, difficulty concentrating, suicidal attempt, anxiety, (Hypo) Manic Symptoms:  NA Anxiety Symptoms:  Excessive Worry, Psychotic Symptoms:  NA PTSD Symptoms: NA Total Time spent with patient: 1 hour  Past Psychiatric History:  - previous dx of MDD without psychotic features - previous inpt to [facility] in April 2019 - previous outpatient with Dr. Guss Bunde but pt has not been following up - 1 previous suicide attempt via overdose  Is the patient at risk to self? Yes.    Has the patient been a risk to self in the past 6 months? Yes.    Has the patient been a risk to self within the distant past? Yes.    Is the patient a risk to others? Yes.    Has the patient been a risk to others in the past 6 months? Yes.    Has the patient been a risk to others within the distant past? Yes.     Prior  Inpatient Therapy:   Prior Outpatient Therapy:    Alcohol Screening: Patient refused Alcohol Screening Tool: Yes 1. How often do you have a drink containing alcohol?: Never 2. How many drinks containing alcohol do you have on a typical day when you are  drinking?: 1 or 2 3. How often do you have six or more drinks on one occasion?: Never AUDIT-C Score: 0 Intervention/Follow-up: Patient Refused Substance Abuse History in the last 12 months:  Yes.   Consequences of Substance Abuse: Medical Consequences:  worsened mood symptoms Previous Psychotropic Medications: Yes  Psychological Evaluations: Yes  Past Medical History: History reviewed. No pertinent past medical history. History reviewed. No pertinent surgical history. Family History: History reviewed. No pertinent family history. Family Psychiatric  History: denies family psychiatric history Tobacco Screening: Have you used any form of tobacco in the last 30 days? (Cigarettes, Smokeless Tobacco, Cigars, and/or Pipes): No Social History: Pt was born and raised in [place]. He lives with his grandmother in [place]. He has completed some college. He works at [employer] loading trucks. He has legal history of possession of cannabis and a pending DUI charge. He has never been married. Has a 85.63 year old daughter whom is in foster care in [place]. He denies trauma history. Social History   Substance and Sexual Activity  Alcohol Use No     Social History   Substance and Sexual Activity  Drug Use Not Currently    Additional Social History:                           Allergies:  No Known Allergies Lab Results:  Results for orders placed or performed during the hospital encounter of 08/26/18 (from the past 48 hour(s))  APTT     Status: None   Collection Time: 08/28/18 11:21 AM  Result Value Ref Range   aPTT 30 24 - 36 seconds    Comment: Performed at [facility], [address], [place], [place] [address]  Protime-INR     Status: None   Collection Time: 08/28/18 11:21 AM  Result Value Ref Range   Prothrombin Time 13.2 11.4 - 15.2 seconds   INR 1.01     Comment: Performed at [facility], [address] [place], [place] [address]    Blood Alcohol level:  Lab  Results  Component Value Date   [facility] <10 08/26/2018   ETH <10 02/03/2018    Metabolic Disorder Labs:  Lab Results  Component Value Date   HGBA1C 6.1 (H) 02/06/2018   MPG 128.37 02/06/2018   No results found for: PROLACTIN Lab Results  Component Value Date   CHOL 204 (H) 02/06/2018   TRIG 43 02/06/2018   HDL 81 02/06/2018   CHOLHDL 2.5 02/06/2018   VLDL 9 02/06/2018   LDLCALC 114 (H) 02/06/2018    Current Medications: Current Facility-Administered Medications  Medication Dose Route Frequency Provider Last Rate Last Dose  . acetaminophen (TYLENOL) tablet 650 mg  650 mg Oral Q6H PRN Fransisca Kaufmann A, NP      . ARIPiprazole (ABILIFY) tablet 10 mg  10 mg Oral Daily Micheal Likens, MD   10 mg at 08/29/18 1304  . famotidine (PEPCID) tablet 20 mg  20 mg Oral Daily Fransisca Kaufmann A, NP   20 mg at 08/29/18 0743  . FLUoxetine (PROZAC) capsule 10 mg  10 mg Oral Daily Fransisca Kaufmann A, NP   10 mg at 08/29/18 0743  .  hydrOXYzine (ATARAX/VISTARIL) tablet 50 mg  50 mg Oral Q6H PRN Micheal Likens, MD      . LORazepam (ATIVAN) tablet 1 mg  1 mg Oral Q6H PRN Micheal Likens, MD       Or  . LORazepam (ATIVAN) injection 1 mg  1 mg Intramuscular Q6H PRN Micheal Likens, MD      . MUSCLE RUB CREA   Topical PRN Fransisca Kaufmann A, NP      . OLANZapine zydis (ZYPREXA) disintegrating tablet 5 mg  5 mg Oral Q8H PRN Micheal Likens, MD       Or  . ziprasidone (GEODON) injection 20 mg  20 mg Intramuscular Q12H PRN Micheal Likens, MD      . ondansetron [facility]) tablet 4 mg  4 mg Oral Q8H PRN Thermon Leyland, NP      . senna-docusate (Senokot-S) tablet 1 tablet  1 tablet Oral QHS PRN Thermon Leyland, NP      . traZODone (DESYREL) tablet 50 mg  50 mg Oral QHS,MR X 1 Kerry Hough, [name]   50 mg at 08/28/18 2129   PTA Medications: Medications Prior to Admission  Medication Sig Dispense Refill Last Dose  . amLODipine (NORVASC) 5 MG tablet Take 1 tablet (5 mg  total) by mouth daily. For high blood pressure. Restart if needed 10 tablet 0   . FLUoxetine (PROZAC) 10 MG capsule Take 1 capsule (10 mg total) by mouth daily. 30 capsule 0   . hydrOXYzine (ATARAX/VISTARIL) 25 MG tablet Take 1 tablet (25 mg total) by mouth at bedtime. 30 tablet 0     Musculoskeletal: Strength & Muscle Tone: within normal limits Gait & Station: normal Patient leans: N/A  Psychiatric Specialty Exam: Physical Exam  Nursing note and vitals reviewed.   Review of Systems  Constitutional: Negative for chills and fever.  Respiratory: Negative for cough and shortness of breath.   Cardiovascular: Negative for chest pain.  Gastrointestinal: Negative for abdominal pain, heartburn, nausea and vomiting.  Psychiatric/Behavioral: Positive for depression and suicidal ideas. Negative for hallucinations. The patient is not nervous/anxious and does not have insomnia.     Blood pressure 130/90, pulse 99, temperature 98.2 F (36.8 C), temperature source Oral, resp. rate 18, height 5\' 10"  (1.778 m), weight 57.2 kg, SpO2 100 %.Body mass index is 18.08 kg/m.  General Appearance: Casual and Fairly Groomed  Eye Contact:  Good  Speech:  Clear and Coherent and Normal Rate  Volume:  Normal  Mood:  Anxious and Euthymic  Affect:  Appropriate, Congruent and Constricted  Thought Process:  Coherent and Goal Directed  Orientation:  Full (Time, Place, and Person)  Thought Content:  Logical  Suicidal Thoughts:  Yes.  without intent/plan  Homicidal Thoughts:  No  Memory:  Immediate;   Fair Recent;   Fair Remote;   Fair  Judgement:  Poor  Insight:  Lacking  Psychomotor Activity:  Normal  Concentration:  Concentration: Fair  Recall:  [employer] of Knowledge:  Fair  Language:  Fair  Akathisia:  No  Handed:    AIMS (if indicated):     Assets:  Resilience Social Support  ADL's:  Intact  Cognition:  WNL  Sleep:  Number of Hours: 6.75    Treatment Plan Summary: Daily contact with patient  to assess and evaluate symptoms and progress in treatment and Medication management  Observation Level/Precautions:  15 minute checks  Laboratory:  CBC Chemistry Profile HbAIC UDS UA  Psychotherapy:  Encourage participation in groups and therapeutic milieu    Medications:  Continue prozac 10mg  po qDay. Start abilify 10mg  po qDay. Continue pepcid 20mg  po qDay. Continue vistaril 50mg  po q6h prn anxiety. Continue trazodone 50mg  po qhs prn insomnia. Continue zydis 5mg  po q8h prn agitation OR geodon 20mg  IM q12h prn severe agitation. Continue ativan 1mg  po/IM q6h prn agitation.  Consultations:    Discharge Concerns:    Estimated LOS: 5-7 days  Other:     Physician Treatment Plan for Primary Diagnosis: MDD (major depressive disorder), recurrent, severe, with psychosis (HCC) Long Term Goal(s): Improvement in symptoms so as ready for discharge  Short Term Goals: Ability to identify and develop effective coping behaviors will improve  Physician Treatment Plan for Secondary Diagnosis: Principal Problem:   MDD (major depressive disorder), recurrent, severe, with psychosis (HCC)  Long Term Goal(s): Improvement in symptoms so as ready for discharge  Short Term Goals: Ability to demonstrate self-control will improve  I certify that inpatient services furnished can reasonably be expected to improve the patient's condition.    Micheal Likens, MD 10/29/20193:27 PM

## 2018-08-29 NOTE — Plan of Care (Signed)
  Problem: Medication: Goal: Compliance with prescribed medication regimen will improve Outcome: Progressing   Problem: Activity: Goal: Interest or engagement in leisure activities will improve Outcome: Progressing  DAR NOTE: Patient presents with anxious affect and mood.  Denies suicidal thoughts, auditory and visual hallucinations.  Described energy level as normal and concentration as good.  Rates depression at 6, hopelessness at 7, and anxiety at 6.  Maintained on routine safety checks.  Medications given as prescribed.  Support and encouragement offered as needed.  Attended group and participated.  States goal for today is "analyzing my fault."  Patient observed socializing with peers in the dayroom.  Offered no complaint.

## 2018-08-29 NOTE — [facility] Group Notes (Signed)
[facility] LCSW Group Therapy Note  Date/Time: 08/29/18, 1100  Type of Therapy/Topic:  Group Therapy:  Feelings about Diagnosis  Participation Level:  None   Mood:pleasant   Description of Group:    This group will allow patients to explore their thoughts and feelings about diagnoses they have received. Patients will be guided to explore their level of understanding and acceptance of these diagnoses. Facilitator will encourage patients to process their thoughts and feelings about the reactions of others to their diagnosis, and will guide patients in identifying ways to discuss their diagnosis with significant others in their lives. This group will be process-oriented, with patients participating in exploration of their own experiences as well as giving and receiving support and challenge from other group members.   Therapeutic Goals: 1. Patient will demonstrate understanding of diagnosis as evidence by identifying two or more symptoms of the disorder:  2. Patient will be able to express two feelings regarding the diagnosis 3. Patient will demonstrate ability to communicate their needs through discussion and/or role plays  Summary of Patient Progress:Pt attentive during group but did not say anything with the exception of responding to several questions from CSW.          Therapeutic Modalities:   Cognitive Behavioral Therapy Brief Therapy Feelings Identification   Daleen Squibb, LCSW

## 2018-08-30 MED ORDER — FLUOXETINE HCL 20 MG PO CAPS
20.0000 mg | ORAL_CAPSULE | Freq: Every day | ORAL | Status: DC
  Administered 2018-08-31 – 2018-09-04 (×5): 20 mg via ORAL
  Filled 2018-08-30 (×6): qty 1

## 2018-08-30 NOTE — Tx Team (Signed)
Interdisciplinary Treatment and Diagnostic Plan Update  08/30/2018 Time of Session: 0948 Jacob [name] MRN: [id number]  Principal Diagnosis: Severe recurrent major depression without psychotic features [facility])  Secondary Diagnoses: Principal Problem:   Severe recurrent major depression without psychotic features (HCC)   Current Medications:  Current Facility-Administered Medications  Medication Dose Route Frequency Provider Last Rate Last Dose  . acetaminophen (TYLENOL) tablet 650 mg  650 mg Oral Q6H PRN Fransisca Kaufmann A, NP      . ARIPiprazole (ABILIFY) tablet 10 mg  10 mg Oral Daily Micheal Likens, MD   10 mg at 08/30/18 0731  . famotidine (PEPCID) tablet 20 mg  20 mg Oral Daily Fransisca Kaufmann A, NP   20 mg at 08/30/18 0730  . [START ON 08/31/2018] FLUoxetine (PROZAC) capsule 20 mg  20 mg Oral Daily [place], Christopher T, MD      . hydrOXYzine (ATARAX/VISTARIL) tablet 50 mg  50 mg Oral Q6H PRN Micheal Likens, MD      . LORazepam (ATIVAN) tablet 1 mg  1 mg Oral Q6H PRN Micheal Likens, MD       Or  . LORazepam (ATIVAN) injection 1 mg  1 mg Intramuscular Q6H PRN Micheal Likens, MD      . MUSCLE RUB CREA   Topical PRN Fransisca Kaufmann A, NP      . OLANZapine zydis (ZYPREXA) disintegrating tablet 5 mg  5 mg Oral Q8H PRN Micheal Likens, MD       Or  . ziprasidone (GEODON) injection 20 mg  20 mg Intramuscular Q12H PRN Micheal Likens, MD      . ondansetron [facility]) tablet 4 mg  4 mg Oral Q8H PRN Thermon Leyland, NP      . senna-docusate (Senokot-S) tablet 1 tablet  1 tablet Oral QHS PRN Thermon Leyland, NP      . traZODone (DESYREL) tablet 50 mg  50 mg Oral QHS,MR X 1 Kerry Hough, [name]   50 mg at 08/28/18 2129   PTA Medications: Medications Prior to Admission  Medication Sig Dispense Refill Last Dose  . amLODipine (NORVASC) 5 MG tablet Take 1 tablet (5 mg total) by mouth daily. For high blood pressure. Restart if needed 10 tablet 0    . FLUoxetine (PROZAC) 10 MG capsule Take 1 capsule (10 mg total) by mouth daily. 30 capsule 0   . hydrOXYzine (ATARAX/VISTARIL) 25 MG tablet Take 1 tablet (25 mg total) by mouth at bedtime. 30 tablet 0     Patient Stressors: Health problems Marital or family conflict  Patient Strengths: Average or above average intelligence Supportive family/friends  Treatment Modalities: Medication Management, Group therapy, Case management,  1 to 1 session with clinician, Psychoeducation, Recreational therapy.   Physician Treatment Plan for Primary Diagnosis: Severe recurrent major depression without psychotic features (HCC) Long Term Goal(s): Improvement in symptoms so as ready for discharge Improvement in symptoms so as ready for discharge   Short Term Goals: Ability to identify and develop effective coping behaviors will improve Ability to demonstrate self-control will improve  Medication Management: Evaluate patient's response, side effects, and tolerance of medication regimen.  Therapeutic Interventions: 1 to 1 sessions, Unit Group sessions and Medication administration.  Evaluation of Outcomes: Progressing  Physician Treatment Plan for Secondary Diagnosis: Principal Problem:   Severe recurrent major depression without psychotic features (HCC)  Long Term Goal(s): Improvement in symptoms so as ready for discharge Improvement in symptoms so as ready for discharge   Short  Term Goals: Ability to identify and develop effective coping behaviors will improve Ability to demonstrate self-control will improve     Medication Management: Evaluate patient's response, side effects, and tolerance of medication regimen.  Therapeutic Interventions: 1 to 1 sessions, Unit Group sessions and Medication administration.  Evaluation of Outcomes: Progressing   RN Treatment Plan for Primary Diagnosis: Severe recurrent major depression without psychotic features (HCC) Long Term Goal(s): Knowledge of  disease and therapeutic regimen to maintain health will improve  Short Term Goals: Ability to identify and develop effective coping behaviors will improve and Compliance with prescribed medications will improve  Medication Management: RN will administer medications as ordered by provider, will assess and evaluate patient's response and provide education to patient for prescribed medication. RN will report any adverse and/or side effects to prescribing provider.  Therapeutic Interventions: 1 on 1 counseling sessions, Psychoeducation, Medication administration, Evaluate responses to treatment, Monitor vital signs and CBGs as ordered, Perform/monitor CIWA, COWS, AIMS and Fall Risk screenings as ordered, Perform wound care treatments as ordered.  Evaluation of Outcomes: Progressing   LCSW Treatment Plan for Primary Diagnosis: Severe recurrent major depression without psychotic features (HCC) Long Term Goal(s): Safe transition to appropriate next level of care at discharge, Engage patient in therapeutic group addressing interpersonal concerns.  Short Term Goals: Engage patient in aftercare planning with referrals and resources, Increase social support and Increase skills for wellness and recovery  Therapeutic Interventions: Assess for all discharge needs, 1 to 1 time with Social worker, Explore available resources and support systems, Assess for adequacy in community support network, Educate family and significant other(s) on suicide prevention, Complete Psychosocial Assessment, Interpersonal group therapy.  Evaluation of Outcomes: Progressing   Progress in Treatment: Attending groups: Yes. Participating in groups: No. Taking medication as prescribed: Yes. Toleration medication: Yes. Family/Significant other contact made: No, will contact:  grandmother Patient understands diagnosis: Yes. Discussing patient identified problems/goals with staff: Yes. Medical problems stabilized or resolved:  Yes. Denies suicidal/homicidal ideation: Yes. Issues/concerns per patient self-inventory: No. Other: none  New problem(s) identified: No, Describe:  none  New Short Term/Long Term Goal(s):  Patient Goals:  "find out how to make my problems better"  Discharge Plan or Barriers:   Reason for Continuation of Hospitalization: Depression Medication stabilization  Estimated Length of Stay: 3-5 days.  Attendees: Patient:Jacob [name] 08/30/2018   Physician: Dr Altamese Flathead, MD 08/30/2018   Nursing: Dossie Arbour, RN 08/30/2018   RN Care Manager: 08/30/2018   Social Worker: Daleen Squibb, LCSW 08/30/2018   Recreational Therapist:  08/30/2018   Other:  08/30/2018   Other:  08/30/2018   Other: 08/30/2018        Scribe for Treatment Team: [name], LCSW 08/30/2018 4:11 PM

## 2018-08-30 NOTE — Progress Notes (Signed)
Patient denies SI, HI and AVH.  Patient has been compliant with medications, attended groups and has had no issues of behavioral dyscontrol.    Assess patient for safety, offer medications as prescribed, engage patient in 1:1 staff talks.   Patient able to contract for safety, Continue to monitor as planned.  

## 2018-08-30 NOTE — Progress Notes (Signed)
Adult Psychoeducational Group Note  Date:  08/30/2018 Time:  8:45 PM  Group Topic/Focus:  Wrap-Up Group:   The focus of this group is to help patients review their daily goal of treatment and discuss progress on daily workbooks.  Participation Level:  Active  Participation Quality:  Appropriate  Affect:  Appropriate  Cognitive:  Appropriate  Insight: Appropriate  Engagement in Group:  Engaged  Modes of Intervention:  Discussion  Additional Comments: The patient expressed that he rates today a 6.The patient also said that he attended groups.  Octavio Manns 08/30/2018, 8:45 PM

## 2018-08-30 NOTE — Progress Notes (Signed)
Recreation Therapy Notes  Date: 10.30.19 Time: 1000 Location: 500 Hall Dayroom   Group Topic: Stress Management  Goal Area(s) Addresses:  Patient will verbalize importance of using healthy stress management.  Patient will identify positive emotions associated with healthy stress management.   Behavioral Response: Engaged  Intervention: Stress Management  Activity :  Guided Imagery & Meditation.  LRT introduced the stress management techniques of guided imagery and meditation.  LRT read a script for patients to envision themselves out looking at the starry sky at night.  LRT then played a meditation to help patients build up their resilience to uncomfortable situations.  Education:  Stress Management, Discharge Planning.   Education Outcome: Acknowledges edcuation/In group clarification offered/Needs additional education  Clinical Observations/Feedback: Pt was quiet but couldn't seem to get comfortable during group session.  Pt expressed stress can cause headaches.  Pt was constantly moving around during the techniques but didn't seem to be disturbing any of his peers.    Caroll Rancher, LRT/CTRS         Caroll Rancher A 08/30/2018 11:53 AM

## 2018-08-30 NOTE — Progress Notes (Signed)
D: Pt denies SI/HI/AVH. Pt is pleasant and cooperative. Pt visible on the unit interacting with peers. Pt stated his goal was to communicate more and he said he did communicate more today.  A: Pt was offered support and encouragement. Pt was given scheduled medications. Pt was encourage to attend groups. Q 15 minute checks were done for safety.  R:Pt attends groups and interacts well with peers and staff. Pt is taking medication. Pt has no complaints.Pt receptive to treatment and safety maintained on unit.  Problem: Self-Concept: Goal: Ability to disclose and discuss suicidal ideas will improve Outcome: Progressing   Problem: Self-Concept: Goal: Will verbalize positive feelings about self Outcome: Progressing   Problem: Activity: Goal: Interest or engagement in leisure activities will improve Outcome: Progressing   Problem: Activity: Goal: Imbalance in normal sleep/wake cycle will improve Outcome: Progressing

## 2018-08-30 NOTE — Progress Notes (Signed)
[facility] MD Progress Note  08/30/2018 9:57 AM Jacob [name]  MRN:  [id number] Subjective:    Jacob [name] is a 27 y/o M with history of MDD without psychotic features who was admitted voluntarily from inpatient medicine unit where he was admitted and medically stabilized after presenting to the ED with suicide attempt via ingestion of rat poison. Pt has relevant history of admission to [facility] in April 2019 for symptoms of depression but he did not follow through with outpatient follow up. After medical clearance, pt was transferred to [facility] for additional treatment and stabilization. He was started on trial of prozac and abilify for depression, thought organization, and mood stabilization. Pt has been reporting incremental improvement of his presenting symptoms.  Today upon evaluation, pt shares, "I'm doing pretty good." He denies any specific concerns aside from some ongoing depression and anxiety, but he notes that these symptoms have improved since being in the hospital. He is sleeping well. His appetite is good. He denies other physical complaints. He denies SI/HI/AH/VH. He is tolerating his medications well, and he is in agreement to continue his current regimen without changes. He shares more about some of his stressors of feeling overwhelmed with work and distressed about lack of access to his daughter in foster care, and he shares that his goal is to find better ways to manage his anxiety and depression. Discussed with patient about plan to increase his dose of prozac, and he was in agreement. He had no further questions, comments, or concerns.  Principal Problem: Severe recurrent major depression without psychotic features (HCC) Diagnosis:   Patient Active Problem List   Diagnosis Date Noted  . Suicidal behavior with attempted self-injury (HCC) [T14.91XA] 08/26/2018  . 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 Severe recurrent major depression without psychotic  features (HCC) [F33.2] 02/04/2018   Total Time spent with patient: 30 minutes  Past Psychiatric History: see H&P  Past Medical History: History reviewed. No pertinent past medical history. History reviewed. No pertinent surgical history. Family History: History reviewed. No pertinent family history. Family Psychiatric  History: see H&P Social History:  Social History   Substance and Sexual Activity  Alcohol Use No     Social History   Substance and Sexual Activity  Drug Use Not Currently    Social History   Socioeconomic History  . Marital status: Single    Spouse name: Not on file  . Number of children: Not on file  . Years of education: Not on file  . Highest education level: Not on file  Occupational History  . Not on file  Social Needs  . Financial resource strain: Not on file  . Food insecurity:    Worry: Not on file    Inability: Not on file  . Transportation needs:    Medical: Not on file    Non-medical: Not on file  Tobacco Use  . Smoking status: Never Smoker  . Smokeless tobacco: Never Used  Substance and Sexual Activity  . Alcohol use: No  . Drug use: Not Currently  . Sexual activity: Not on file  Lifestyle  . Physical activity:    Days per week: Not on file    Minutes per session: Not on file  . Stress: Not on file  Relationships  . Social connections:    Talks on phone: Not on file    Gets together: Not on file    Attends religious service: Not on file    Active  member of club or organization: Not on file    Attends meetings of clubs or organizations: Not on file    Relationship status: Not on file  Other Topics Concern  . Not on file  Social History Narrative  . Not on file   Additional Social History:                         Sleep: Good  Appetite:  Good  Current Medications: Current Facility-Administered Medications  Medication Dose Route Frequency Provider Last Rate Last Dose  . acetaminophen (TYLENOL) tablet 650 mg   650 mg Oral Q6H PRN Fransisca Kaufmann A, NP      . ARIPiprazole (ABILIFY) tablet 10 mg  10 mg Oral Daily Micheal Likens, MD   10 mg at 08/30/18 0731  . famotidine (PEPCID) tablet 20 mg  20 mg Oral Daily Fransisca Kaufmann A, NP   20 mg at 08/30/18 0730  . [START ON 08/31/2018] FLUoxetine (PROZAC) capsule 20 mg  20 mg Oral Daily Lincy Belles T, MD      . hydrOXYzine (ATARAX/VISTARIL) tablet 50 mg  50 mg Oral Q6H PRN Micheal Likens, MD      . LORazepam (ATIVAN) tablet 1 mg  1 mg Oral Q6H PRN Micheal Likens, MD       Or  . LORazepam (ATIVAN) injection 1 mg  1 mg Intramuscular Q6H PRN Micheal Likens, MD      . MUSCLE RUB CREA   Topical PRN Fransisca Kaufmann A, NP      . OLANZapine zydis (ZYPREXA) disintegrating tablet 5 mg  5 mg Oral Q8H PRN Micheal Likens, MD       Or  . ziprasidone (GEODON) injection 20 mg  20 mg Intramuscular Q12H PRN Micheal Likens, MD      . ondansetron [facility]) tablet 4 mg  4 mg Oral Q8H PRN Thermon Leyland, NP      . senna-docusate (Senokot-S) tablet 1 tablet  1 tablet Oral QHS PRN Thermon Leyland, NP      . traZODone (DESYREL) tablet 50 mg  50 mg Oral QHS,MR X 1 Kerry Hough, [name]   50 mg at 08/28/18 2129    Lab Results:  Results for orders placed or performed during the hospital encounter of 08/26/18 (from the past 48 hour(s))  APTT     Status: None   Collection Time: 08/28/18 11:21 AM  Result Value Ref Range   aPTT 30 24 - 36 seconds    Comment: Performed at [facility], [address], [place], [place] [address]  Protime-INR     Status: None   Collection Time: 08/28/18 11:21 AM  Result Value Ref Range   Prothrombin Time 13.2 11.4 - 15.2 seconds   INR 1.01     Comment: Performed at [facility], [address], [place], [place] [address]    Blood Alcohol level:  Lab Results  Component Value Date   [facility] <10 08/26/2018   ETH <10 02/03/2018    Metabolic Disorder Labs: Lab Results   Component Value Date   HGBA1C 6.1 (H) 02/06/2018   MPG 128.37 02/06/2018   No results found for: PROLACTIN Lab Results  Component Value Date   CHOL 204 (H) 02/06/2018   TRIG 43 02/06/2018   HDL 81 02/06/2018   CHOLHDL 2.5 02/06/2018   VLDL 9 02/06/2018   LDLCALC 114 (H) 02/06/2018    Physical  Findings: AIMS: Facial and Oral Movements Muscles of Facial Expression: None, normal Lips and Perioral Area: None, normal Jaw: None, normal Tongue: None, normal,Extremity Movements Upper (arms, wrists, hands, fingers): None, normal Lower (legs, knees, ankles, toes): None, normal, Trunk Movements Neck, shoulders, hips: None, normal, Overall Severity Severity of abnormal movements (highest score from questions above): None, normal Incapacitation due to abnormal movements: None, normal Patient's awareness of abnormal movements (rate only patient's report): No Awareness, Dental Status Current problems with teeth and/or dentures?: No Does patient usually wear dentures?: No  CIWA:    COWS:     Musculoskeletal: Strength & Muscle Tone: within normal limits Gait & Station: normal Patient leans: N/A  Psychiatric Specialty Exam: Physical Exam  Nursing note and vitals reviewed.   Review of Systems  Constitutional: Negative for chills and fever.  Respiratory: Negative for cough and shortness of breath.   Cardiovascular: Negative for chest pain.  Gastrointestinal: Negative for abdominal pain, heartburn, nausea and vomiting.  Psychiatric/Behavioral: Positive for depression. Negative for hallucinations and suicidal ideas. The patient is not nervous/anxious and does not have insomnia.     Blood pressure (!) 133/97, pulse 98, temperature 98.4 F (36.9 C), temperature source Oral, resp. rate 16, height 5\' 10"  (1.778 m), weight 57.2 kg, SpO2 100 %.Body mass index is 18.08 kg/m.  General Appearance: Casual and Fairly Groomed  Eye Contact:  Good  Speech:  Clear and Coherent and Normal Rate   Volume:  Normal  Mood:  Anxious and Depressed  Affect:  Congruent and Constricted  Thought Process:  Coherent and Goal Directed  Orientation:  Full (Time, Place, and Person)  Thought Content:  Logical  Suicidal Thoughts:  No  Homicidal Thoughts:  No  Memory:  Immediate;   Fair Recent;   Fair Remote;   Fair  Judgement:  Poor  Insight:  Fair  Psychomotor Activity:  Normal  Concentration:  Concentration: Fair  Recall:  Fair  Fund of Knowledge:  Good  Language:  Fair  Akathisia:  No  Handed:    AIMS (if indicated):     Assets:  Resilience Social Support  ADL's:  Intact  Cognition:  WNL  Sleep:  Number of Hours: 6.75   Treatment Plan Summary: Daily contact with patient to assess and evaluate symptoms and progress in treatment and Medication management   -Continue inpatient hospitalization  -MDD, recurrent, severe, without psychotic features   -Change prozac 10mg  po qDay to prozac 20mg  po qDay   -Continue abilify 10mg  po qDay  -anxiety  -Continue vistaril 50mg  po q6h prn anxiety  -insomnia   -Continue trazodone 50mg  po qhs prn insomnia (may repeat x1 prn insomnia)  -agitation   -Continue zydis 5mg  po q8h prn agitation  -Continue geodon 20mg  IM q12h prn severe agitation   -Continue ativan 1mg  po/IM q6h prn agitation  -Encourage participation in groups and therapeutic milieu  -disposition planning will be ongoing  Micheal Likens, MD 08/30/2018, 9:57 AM

## 2018-08-30 NOTE — Progress Notes (Signed)
D: Pt denies SI/HI/AVH. Pt is pleasant and cooperative. Pt stated he was doing ok this evening, pt very cautious and guarded with staff this evening. Pt visible on the unit in the dayroom some this evening.   A: Pt was offered support and encouragement. Pt was given scheduled medications. Pt was encourage to attend groups. Q 15 minute checks were done for safety.   R:Pt attends groups and interacts well with peers and staff. Pt is taking medication. Pt has no complaints.Pt receptive to treatment and safety maintained on unit.  Problem: Education: Goal: Emotional status will improve Outcome: Progressing   Problem: Education: Goal: Mental status will improve Outcome: Progressing

## 2018-08-30 NOTE — [facility] Group Notes (Signed)
LCSW Group Therapy Note  08/30/2018 9:17 AM  Type of Therapy/Topic:  Group Therapy:  Emotion Regulation  Participation Level:  Did Not Attend   Description of Group:   The purpose of this group is to assist patients in learning to regulate negative emotions and experience positive emotions. Patients will be guided to discuss ways in which they have been vulnerable to their negative emotions. These vulnerabilities will be juxtaposed with experiences of positive emotions or situations, and patients will be challenged to use positive emotions to combat negative ones. Special emphasis will be placed on coping with negative emotions in conflict situations, and patients will process healthy conflict resolution skills.  Therapeutic Goals: 1. Patient will identify two positive emotions or experiences to reflect on in order to balance out   negative emotions 2. Patient will label two or more emotions that they find the most difficult to experience 3. Patient will demonstrate positive conflict resolution skills through discussion and/or role plays  Summary of Patient Progress:     Therapeutic Modalities:   Cognitive Behavioral Therapy Feelings Identification Dialectical Behavioral Therapy   Marian Sorrow MSW Intern 08/30/2018 9:17 AM

## 2018-08-31 MED ORDER — ARIPIPRAZOLE ER 400 MG IM SRER
400.0000 mg | INTRAMUSCULAR | Status: DC
  Administered 2018-09-01: 400 mg via INTRAMUSCULAR
  Filled 2018-08-31 (×2): qty 2

## 2018-08-31 MED ORDER — ARIPIPRAZOLE 15 MG PO TABS
15.0000 mg | ORAL_TABLET | Freq: Every day | ORAL | Status: DC
  Administered 2018-09-01 – 2018-09-04 (×4): 15 mg via ORAL
  Filled 2018-08-31 (×5): qty 1

## 2018-08-31 NOTE — [facility] Suicide Risk Assessment (Addendum)
[facility] INPATIENT:  Family/Significant Other Suicide Prevention Education  Suicide Prevention Education:  Contact Attempts: Jamespaul Secrist, grandmother, [phone] been identified by the patient as the family member/significant other with whom the patient will be residing, and identified as the person(s) who will aid the patient in the event of a mental health crisis.  With written consent from the patient, two attempts were made to provide suicide prevention education, prior to and/or following the patient's discharge.  We were unsuccessful in providing suicide prevention education.  A suicide education pamphlet was given to the patient to share with family/significant other.  Date and time of first attempt:08/31/18, 59 Date and time of second attempt:09/01/18, 1247  [name], LCSW 08/31/2018, 11:01 AM

## 2018-08-31 NOTE — Progress Notes (Signed)
[facility] MD Progress Note  08/31/2018 2:04 PM Caison Timko  MRN:  [id number] Subjective:    Jacob [name] is a 27 y/o M with history of MDD without psychotic features who was admitted voluntarily from inpatient medicine unit where he was admitted and medically stabilized after presenting to the ED with suicide attempt via ingestion of rat poison. Pt has relevant history of admission to [facility] in April 2019 for symptoms of depression but he did not follow through with outpatient follow up. After medical clearance, pt was transferred to [facility] for additional treatment and stabilization. He was started on trial of prozac and abilify for depression, thought organization, and mood stabilization. Doses of prozac and abilify have been titrated up during his stay. Pt has been reporting incremental improvement of his presenting symptoms.  Today upon evaluation, pt shares, "I'm pretty good - my sleep is really good." He denies any specific concerns today. He reports that his mood continues to improve each day. He is sleeping well. His appetite is good. He denies other physical complaints. He denies SI/HI/AH/VH. He is tolerating his medications well, and he is in agreement to increase dose of abilify today with plan to start long-acting injection form of abilify Maintena tomorrow. Pt notes that he is benefiting from attending groups. He had no further questions, comments, or concerns.   Principal Problem: Severe recurrent major depression without psychotic features (HCC) Diagnosis:   Patient Active Problem List   Diagnosis Date Noted  . Suicidal behavior with attempted self-injury (HCC) [T14.91XA] 08/26/2018  . 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 Severe recurrent major depression without psychotic features (HCC) [F33.2] 02/04/2018   Total Time spent with patient: 30 minutes  Past Psychiatric History: see H&P  Past Medical History: History reviewed. No pertinent past medical  history. History reviewed. No pertinent surgical history. Family History: History reviewed. No pertinent family history. Family Psychiatric  History: see H&P Social History:  Social History   Substance and Sexual Activity  Alcohol Use No     Social History   Substance and Sexual Activity  Drug Use Not Currently    Social History   Socioeconomic History  . Marital status: Single    Spouse name: Not on file  . Number of children: Not on file  . Years of education: Not on file  . Highest education level: Not on file  Occupational History  . Not on file  Social Needs  . Financial resource strain: Not on file  . Food insecurity:    Worry: Not on file    Inability: Not on file  . Transportation needs:    Medical: Not on file    Non-medical: Not on file  Tobacco Use  . Smoking status: Never Smoker  . Smokeless tobacco: Never Used  Substance and Sexual Activity  . Alcohol use: No  . Drug use: Not Currently  . Sexual activity: Not on file  Lifestyle  . Physical activity:    Days per week: Not on file    Minutes per session: Not on file  . Stress: Not on file  Relationships  . Social connections:    Talks on phone: Not on file    Gets together: Not on file    Attends religious service: Not on file    Active member of club or organization: Not on file    Attends meetings of clubs or organizations: Not on file    Relationship status: Not on file  Other  Topics Concern  . Not on file  Social History Narrative  . Not on file   Additional Social History:                         Sleep: Good  Appetite:  Good  Current Medications: Current Facility-Administered Medications  Medication Dose Route Frequency Provider Last Rate Last Dose  . acetaminophen (TYLENOL) tablet 650 mg  650 mg Oral Q6H PRN Thermon Leyland, NP      . Melene Muller ON 09/01/2018] ARIPiprazole (ABILIFY) tablet 15 mg  15 mg Oral Daily Micheal Likens, MD      . Melene Muller ON 09/01/2018]  ARIPiprazole ER (ABILIFY MAINTENA) injection 400 mg  400 mg Intramuscular Q28 days Micheal Likens, MD      . famotidine (PEPCID) tablet 20 mg  20 mg Oral Daily Fransisca Kaufmann A, NP   20 mg at 08/31/18 0747  . FLUoxetine (PROZAC) capsule 20 mg  20 mg Oral Daily Micheal Likens, MD   20 mg at 08/31/18 0746  . hydrOXYzine (ATARAX/VISTARIL) tablet 50 mg  50 mg Oral Q6H PRN Micheal Likens, MD   50 mg at 08/30/18 2113  . LORazepam (ATIVAN) tablet 1 mg  1 mg Oral Q6H PRN Micheal Likens, MD       Or  . LORazepam (ATIVAN) injection 1 mg  1 mg Intramuscular Q6H PRN Micheal Likens, MD      . MUSCLE RUB CREA   Topical PRN Fransisca Kaufmann A, NP      . OLANZapine zydis (ZYPREXA) disintegrating tablet 5 mg  5 mg Oral Q8H PRN Micheal Likens, MD       Or  . ziprasidone (GEODON) injection 20 mg  20 mg Intramuscular Q12H PRN Micheal Likens, MD      . ondansetron [facility]) tablet 4 mg  4 mg Oral Q8H PRN Thermon Leyland, NP      . senna-docusate (Senokot-S) tablet 1 tablet  1 tablet Oral QHS PRN Thermon Leyland, NP      . traZODone (DESYREL) tablet 50 mg  50 mg Oral QHS,MR X 1 Kerry Hough, [name]   50 mg at 08/30/18 2113    Lab Results: No results found for this or any previous visit (from the past 48 hour(s)).  Blood Alcohol level:  Lab Results  Component Value Date   ETH <10 08/26/2018   ETH <10 02/03/2018    Metabolic Disorder Labs: Lab Results  Component Value Date   HGBA1C 6.1 (H) 02/06/2018   MPG 128.37 02/06/2018   No results found for: PROLACTIN Lab Results  Component Value Date   CHOL 204 (H) 02/06/2018   TRIG 43 02/06/2018   HDL 81 02/06/2018   CHOLHDL 2.5 02/06/2018   VLDL 9 02/06/2018   LDLCALC 114 (H) 02/06/2018    Physical Findings: AIMS: Facial and Oral Movements Muscles of Facial Expression: None, normal Lips and Perioral Area: None, normal Jaw: None, normal Tongue: None, normal,Extremity Movements Upper (arms,  wrists, hands, fingers): None, normal Lower (legs, knees, ankles, toes): None, normal, Trunk Movements Neck, shoulders, hips: None, normal, Overall Severity Severity of abnormal movements (highest score from questions above): None, normal Incapacitation due to abnormal movements: None, normal Patient's awareness of abnormal movements (rate only patient's report): No Awareness, Dental Status Current problems with teeth and/or dentures?: No Does patient usually wear dentures?: No  CIWA:    COWS:     Musculoskeletal:  Strength & Muscle Tone: within normal limits Gait & Station: normal Patient leans: N/A  Psychiatric Specialty Exam: Physical Exam  Nursing note and vitals reviewed.   Review of Systems  Constitutional: Negative for chills and fever.  Respiratory: Negative for cough and shortness of breath.   Cardiovascular: Negative for chest pain.  Gastrointestinal: Negative for abdominal pain, heartburn, nausea and vomiting.  Psychiatric/Behavioral: Negative for depression, hallucinations and suicidal ideas. The patient is not nervous/anxious and does not have insomnia.     Blood pressure (!) 133/97, pulse 98, temperature 98.4 F (36.9 C), temperature source Oral, resp. rate 16, height 5\' 10"  (1.778 m), weight 57.2 kg, SpO2 100 %.Body mass index is 18.08 kg/m.  General Appearance: Casual and Fairly Groomed  Eye Contact:  Good  Speech:  Clear and Coherent and Normal Rate  Volume:  Normal  Mood:  Euthymic  Affect:  Blunt and Congruent  Thought Process:  Coherent and Goal Directed  Orientation:  Full (Time, Place, and Person)  Thought Content:  Logical  Suicidal Thoughts:  No  Homicidal Thoughts:  No  Memory:  Immediate;   Fair Recent;   Fair Remote;   Fair  Judgement:  Fair  Insight:  Lacking  Psychomotor Activity:  Normal  Concentration:  Concentration: Fair  Recall:  [employer] of Knowledge:  Fair  Language:  Fair  Akathisia:  No  Handed:    AIMS (if indicated):      Assets:  Resilience Social Support  ADL's:  Intact  Cognition:  WNL  Sleep:  Number of Hours: 6.75   Treatment Plan Summary: Daily contact with patient to assess and evaluate symptoms and progress in treatment and Medication management   -Continue inpatient hospitalization  -MDD, recurrent, severe, without psychotic features             -Continue prozac 20mg  po qDay             -Change abilify 10mg  po qDay to abilify 15mg  po qDay   -Plan to start abilify maintena 400mg  IM q30 days starting tomorrow 09/01/18  -anxiety             -Continue vistaril 50mg  po q6h prn anxiety  -insomnia             -Continue trazodone 50mg  po qhs prn insomnia (may repeat x1 prn insomnia)  -agitation             -Continue zydis 5mg  po q8h prn agitation             -Continue geodon 20mg  IM q12h prn severe agitation             -Continue ativan 1mg  po/IM q6h prn agitation  -Encourage participation in groups and therapeutic milieu  -disposition planning will be ongoing  Micheal Likens, MD 08/31/2018, 2:04 PM

## 2018-08-31 NOTE — Progress Notes (Signed)
Adult Psychoeducational Group Note  Date:  08/31/2018 Time:  8:50 PM  Group Topic/Focus:  Wrap-Up Group:   The focus of this group is to help patients review their daily goal of treatment and discuss progress on daily workbooks.  Participation Level:  Active  Participation Quality:  Appropriate  Affect:  Appropriate  Cognitive:  Appropriate  Insight: Appropriate  Engagement in Group:  Engaged  Modes of Intervention:  Discussion  Additional Comments:  The patient expressed that he rates today a 7.The patient expressed that he attended groups and achieved his goal.  Octavio Manns 08/31/2018, 8:50 PM

## 2018-08-31 NOTE — Progress Notes (Signed)
Recreation Therapy Notes  Date: 10.31.19 Time: 1000 Location: [address] Dayroom   Group Topic: Communication, Team Building, Problem Solving  Goal Area(s) Addresses:  Patient will effectively work with peer towards shared goal.  Patient will identify skill used to make activity successful.  Patient will identify how skills used during activity can be used to reach post d/c goals.   Behavioral Response: None  Intervention: STEM Activity   Activity: [employer]. Patients were provided the following materials: 5 drinking straws, 5 rubber bands, 5 paper clips, 2 index cards, 2 drinking cups, and 2 toilet paper rolls. Using the provided materials patients were asked to build a launching mechanisms to launch a ping pong ball approximately 12 feet. Patients were divided into teams of 3-5.   Education: Pharmacist, community, Building control surveyor.   Education Outcome: Acknowledges education/In group clarification offered/Needs additional education.   Clinical Observations/Feedback: Pt came in late to group.  LRT placed pt with one of the groups that needed an extra person and explained the rules of the activity to the pt.  Pt did not contribute to the group, pt sat quietly and observed.    Caroll Rancher, LRT/CTRS      Caroll Rancher A 08/31/2018 11:37 AM

## 2018-08-31 NOTE — Progress Notes (Signed)
Patient denies SI, HI and AVH.  Patient has been compliant with medications, attended groups and has had no issues of behavioral dyscontrol.    Assess patient for safety, offer medications as prescribed, engage patient in 1:1 staff talks.   Patient able to contract for safety, Continue to monitor as planned.

## 2018-08-31 NOTE — [facility] Suicide Risk Assessment (Addendum)
[facility] INPATIENT:  Family/Significant Other Suicide Prevention Education  Suicide Prevention Education:  Contact Attempts: SAWYER,[name] A Mother [phone]     has been identified by the patient as the family member/significant other with whom the patient will be residing, and identified as the person(s) who will aid the patient in the event of a mental health crisis.  With written consent from the patient, two attempts were made to provide suicide prevention education, prior to and/or following the patient's discharge.  We were unsuccessful in providing suicide prevention education.  A suicide education pamphlet was given to the patient to share with family/significant other.  Date and time of first attempt:08/31/18, 1100 Date and time of second attempt:09/01/18, 1248  [name], LCSW 08/31/2018, 11:00 AM

## 2018-08-31 NOTE — [facility] Group Notes (Signed)
[facility] LCSW Group Therapy Note  Date/Time: 08/31/18, 1345  Type of Therapy/Topic:  Group Therapy:  Balance in Life  Participation Level:  moderate  Description of Group:    This group will address the concept of balance and how it feels and looks when one is unbalanced. Patients will be encouraged to process areas in their lives that are out of balance, and identify reasons for remaining unbalanced. Facilitators will guide patients utilizing problem- solving interventions to address and correct the stressor making their life unbalanced. Understanding and applying boundaries will be explored and addressed for obtaining  and maintaining a balanced life. Patients will be encouraged to explore ways to assertively make their unbalanced needs known to significant others in their lives, using other group members and facilitator for support and feedback.  Therapeutic Goals: 1. Patient will identify two or more emotions or situations they have that consume much of in their lives. 2. Patient will identify signs/triggers that life has become out of balance:  3. Patient will identify two ways to set boundaries in order to achieve balance in their lives:  4. Patient will demonstrate ability to communicate their needs through discussion and/or role plays  Summary of Patient Progress:Pt smiling and more active in group today.   Pt shared that mental/emotional and friends are areas that are out of balance in his life.  Pt made several comments during group discussion and was engaged throughout.          Therapeutic Modalities:   Cognitive Behavioral Therapy Solution-Focused Therapy Assertiveness Training  Daleen Squibb, [place]

## 2018-09-01 DIAGNOSIS — F419 Anxiety disorder, unspecified: Secondary | ICD-10-CM

## 2018-09-01 DIAGNOSIS — G4700 Insomnia, unspecified: Secondary | ICD-10-CM

## 2018-09-01 NOTE — Progress Notes (Signed)
[facility] MD Progress Note  09/01/2018 2:20 PM Jacob [name]  MRN:  [id number]  Subjective: Jacob [name] reports, "I was having a mental lapse. I thought my mother was using drugs. It made me feel useless. I'm still feeling useless. I'm just trying to cope, to not get distracted by outside comments. I also do not like taking medicines. I prefer shots instead".  Jacob [name] is a 27 y/o M with history of MDD without psychotic features who was admitted voluntarily from inpatient medicine unit where he was admitted and medically stabilized after presenting to the ED with suicide attempt via ingestion of rat poison. Pt has relevant history of admission to [facility] in April 2019 for symptoms of depression but he did not follow through with outpatient follow up. After medical clearance, pt was transferred to [facility] for additional treatment and stabilization. He was started on trial of prozac and abilify for depression, thought organization, and mood stabilization. Doses of prozac and abilify have been titrated up during his stay. Pt has been reporting incremental improvement of his presenting symptoms.  Today upon evaluation, pt shares, "I was having a mental lapse. I thought my mother was using drugs. It made feel useless. I'm still feeling useless. I'm just trying to cope, to not get distracted by outside comments. I also do not like taking medicines. I prefer shots instead". He denies any specific concerns today. He is sleeping well. His appetite is good. He denies other physical complaints. He denies SI/HI/AH/VH. He is tolerating his medications well, and he is in agreement to continue current plan of care & to start long-acting injection form of abilify Maintena today. Pt notes that he is benefiting from attending groups. He had no further questions, comments, or concerns.  Principal Problem: Severe recurrent major depression without psychotic features (HCC) Diagnosis:   Patient Active Problem List   Diagnosis Date Noted   . Severe recurrent major depression with psychotic features (HCC) [F33.3]     Priority: High  . Suicidal behavior with attempted self-injury (HCC) [T14.91XA] 08/26/2018  . Severe recurrent major depression without psychotic features (HCC) [F33.2] 02/04/2018   Total Time spent with patient: 15 minutes  Past Psychiatric History: See H&P  Past Medical History: History reviewed. No pertinent past medical history. History reviewed. No pertinent surgical history. Family History: History reviewed. No pertinent family history.  Family Psychiatric  History: See H&P  Social History:  Social History   Substance and Sexual Activity  Alcohol Use No     Social History   Substance and Sexual Activity  Drug Use Not Currently    Social History   Socioeconomic History  . Marital status: Single    Spouse name: Not on file  . Number of children: Not on file  . Years of education: Not on file  . Highest education level: Not on file  Occupational History  . Not on file  Social Needs  . Financial resource strain: Not on file  . Food insecurity:    Worry: Not on file    Inability: Not on file  . Transportation needs:    Medical: Not on file    Non-medical: Not on file  Tobacco Use  . Smoking status: Never Smoker  . Smokeless tobacco: Never Used  Substance and Sexual Activity  . Alcohol use: No  . Drug use: Not Currently  . Sexual activity: Not on file  Lifestyle  . Physical activity:    Days per week: Not on file    Minutes per  session: Not on file  . Stress: Not on file  Relationships  . Social connections:    Talks on phone: Not on file    Gets together: Not on file    Attends religious service: Not on file    Active member of club or organization: Not on file    Attends meetings of clubs or organizations: Not on file    Relationship status: Not on file  Other Topics Concern  . Not on file  Social History Narrative  . Not on file   Additional Social History:   Sleep:  Good  Appetite:  Good  Current Medications: Current Facility-Administered Medications  Medication Dose Route Frequency Provider Last Rate Last Dose  . acetaminophen (TYLENOL) tablet 650 mg  650 mg Oral Q6H PRN Fransisca Kaufmann A, NP      . ARIPiprazole (ABILIFY) tablet 15 mg  15 mg Oral Daily Micheal Likens, MD   15 mg at 09/01/18 0743  . ARIPiprazole ER (ABILIFY MAINTENA) injection 400 mg  400 mg Intramuscular Q28 days Jolyne Loa T, MD   400 mg at 09/01/18 1415  . famotidine (PEPCID) tablet 20 mg  20 mg Oral Daily Fransisca Kaufmann A, NP   20 mg at 09/01/18 0743  . FLUoxetine (PROZAC) capsule 20 mg  20 mg Oral Daily Micheal Likens, MD   20 mg at 09/01/18 0743  . hydrOXYzine (ATARAX/VISTARIL) tablet 50 mg  50 mg Oral Q6H PRN Micheal Likens, MD   50 mg at 08/31/18 2151  . LORazepam (ATIVAN) tablet 1 mg  1 mg Oral Q6H PRN Micheal Likens, MD       Or  . LORazepam (ATIVAN) injection 1 mg  1 mg Intramuscular Q6H PRN Micheal Likens, MD      . MUSCLE RUB CREA   Topical PRN Fransisca Kaufmann A, NP      . OLANZapine zydis (ZYPREXA) disintegrating tablet 5 mg  5 mg Oral Q8H PRN Micheal Likens, MD       Or  . ziprasidone (GEODON) injection 20 mg  20 mg Intramuscular Q12H PRN Micheal Likens, MD      . ondansetron [facility]) tablet 4 mg  4 mg Oral Q8H PRN Thermon Leyland, NP      . senna-docusate (Senokot-S) tablet 1 tablet  1 tablet Oral QHS PRN Thermon Leyland, NP      . traZODone (DESYREL) tablet 50 mg  50 mg Oral QHS,MR X 1 Kerry Hough, [name]   50 mg at 08/31/18 2151   Lab Results: No results found for this or any previous visit (from the past 48 hour(s)).  Blood Alcohol level:  Lab Results  Component Value Date   ETH <10 08/26/2018   ETH <10 02/03/2018   Metabolic Disorder Labs: Lab Results  Component Value Date   HGBA1C 6.1 (H) 02/06/2018   MPG 128.37 02/06/2018   No results found for: PROLACTIN Lab Results   Component Value Date   CHOL 204 (H) 02/06/2018   TRIG 43 02/06/2018   HDL 81 02/06/2018   CHOLHDL 2.5 02/06/2018   VLDL 9 02/06/2018   LDLCALC 114 (H) 02/06/2018   Physical Findings: AIMS: Facial and Oral Movements Muscles of Facial Expression: None, normal Lips and Perioral Area: None, normal Jaw: None, normal Tongue: None, normal,Extremity Movements Upper (arms, wrists, hands, fingers): None, normal Lower (legs, knees, ankles, toes): None, normal, Trunk Movements Neck, shoulders, hips: None, normal, Overall Severity Severity of abnormal movements (  highest score from questions above): None, normal Incapacitation due to abnormal movements: None, normal Patient's awareness of abnormal movements (rate only patient's report): No Awareness, Dental Status Current problems with teeth and/or dentures?: No Does patient usually wear dentures?: No  CIWA:    COWS:     Musculoskeletal: Strength & Muscle Tone: within normal limits Gait & Station: normal Patient leans: N/A  Psychiatric Specialty Exam: Physical Exam  Nursing note and vitals reviewed.   Review of Systems  Constitutional: Negative for chills and fever.  Respiratory: Negative for cough and shortness of breath.   Cardiovascular: Negative for chest pain.  Gastrointestinal: Negative for abdominal pain, heartburn, nausea and vomiting.  Psychiatric/Behavioral: Negative for depression, hallucinations and suicidal ideas. The patient is not nervous/anxious and does not have insomnia.     Blood pressure 123/89, pulse 80, temperature 97.7 F (36.5 C), temperature source Oral, resp. rate 20, height 5\' 10"  (1.778 m), weight 57.2 kg, SpO2 100 %.Body mass index is 18.08 kg/m.  General Appearance: Casual and Fairly Groomed  Eye Contact:  Good  Speech:  Clear and Coherent and Normal Rate  Volume:  Normal  Mood:  Euthymic  Affect:  Blunt and Congruent  Thought Process:  Coherent and Goal Directed  Orientation:  Full (Time, Place,  and Person)  Thought Content:  Logical  Suicidal Thoughts:  No  Homicidal Thoughts:  No  Memory:  Immediate;   Fair Recent;   Fair Remote;   Fair  Judgement:  Fair  Insight:  Lacking  Psychomotor Activity:  Normal  Concentration:  Concentration: Fair  Recall:  [employer] of Knowledge:  Fair  Language:  Fair  Akathisia:  No  Handed:    AIMS (if indicated):     Assets:  Resilience Social Support  ADL's:  Intact  Cognition:  WNL  Sleep:  Number of Hours: 6.75   Treatment Plan Summary: Daily contact with patient to assess and evaluate symptoms and progress in treatment and Medication management   -Continue inpatient hospitalization.  -Will continue today 09/01/2018 plan as below except where it is noted.  -MDD, recurrent, severe, without psychotic features             -Continue prozac 20mg  po qDay             -Continue abilify 15mg  po qDay   -Start abilify maintena 400 mg IM q30 days starting today 09/01/18  -anxiety             -Continue vistaril 50mg  po q6h prn anxiety  -insomnia             -Continue trazodone 50mg  po qhs prn insomnia (may repeat x1 prn insomnia)  -agitation             -Continue zydis 5mg  po q8h prn agitation             -Continue geodon 20mg  IM q12h prn severe agitation             -Continue ativan 1mg  po/IM q6h prn agitation  -Encourage participation in groups and therapeutic milieu  -disposition planning will be ongoing  Armandina Stammer, NP, PMHNP, FNP-BC 09/01/2018, 2:20 PMPatient ID: Jacob [name], male   DOB: 1991/04/28, 27 y.o.   MRN: [id number]

## 2018-09-01 NOTE — Progress Notes (Signed)
Recreation Therapy Notes  Date: 11.1.19 Time: 1000 Location: 500 Hall  Group Topic: Communication  Goal Area(s) Addresses:  Patient will effectively communicate with peers in group.  Patient will effectively communicate to achieve shared group goal. Patient will identify techniques that made activity effective for group.   Behavioral Response: Engaged  Intervention: Round rubber discs  Activity: Sharks in [employer].  Each person was given a rubber disc and one disc was given to the group.  Patients were to use the discs to move the group from the starting point to the end of the hall and back.  If anyone stepped off their disc, the group would have to start over.  Education: Communication, Discharge Planning  Education Outcome: Acknowledges understanding/In group clarification offered/Needs additional education.   Clinical Observations/Feedback: Pt joined in the activity a little late so he ended up being at the end of the line.  Pt worked well with the group and was able to fulfill his responsibilities as being the last person.  Pt stated being last was easy for him because he trusted his peers decisions but also explained it was hard not being in control of the tempo in which the group moved.    Caroll Rancher, LRT/CTRS     Caroll Rancher A 09/01/2018 11:14 AM

## 2018-09-01 NOTE — Plan of Care (Signed)
Progress Note  D: pt found in the dayroom; compliant with medication administration. Pt states he slept fair last night. Pt rates his depression/hopelessness/anxiety a 4/4/4 out of 10 respectively. Pt denies any physical symptoms, but did rate his pain at a 3/10. Pt denied any pain to this writer upon assessment. Pt states his goal for today is to plan on the first thing he is doing(?) and will achieve this by talking about how to begin his first step. Pt denies any si/hi/ah/vh and verbally agrees to approach staff if these become apparent or before harming himself while at [facility]. A: pt provided support and encouragement. Pt given medications per protocol and standing orders. Q53m safety checks implemented and continued.  R: pt safe on the unit, will continue to monitor.  Pt progressing in the following metrics  Problem: Education: Goal: Knowledge of [facility] Education information/materials will improve Outcome: Progressing Goal: Verbalization of understanding the information provided will improve Outcome: Progressing   Problem: Self-Concept: Goal: Ability to disclose and discuss suicidal ideas will improve Outcome: Progressing Goal: Will verbalize positive feelings about self Outcome: Progressing   Problem: Activity: Goal: Interest or engagement in leisure activities will improve Outcome: Progressing Goal: Imbalance in normal sleep/wake cycle will improve Outcome: Progressing

## 2018-09-01 NOTE — [facility] Group Notes (Signed)
Date: 09/01/18, 1315  Type of Therapy and Topic: Chaplain group, "Hope is.." Chaplain engaged group in discussion about hope and what it looks like in each patients life and current situation.  Participation level:minimal  Modes of Intervention: Discussion, Education and Socialization  Summary of Progress/Problems:Pt present and appeared to be attentive.  Pt responded to questions from the chaplain but otherwise did not participate in the group discussion.     [name], LCSW 09/01/2018 1:36 PM  [facility] LCSW Group Therapy Note

## 2018-09-01 NOTE — [facility] Suicide Risk Assessment (Signed)
[facility] INPATIENT:  Family/Significant Other Suicide Prevention Education  Suicide Prevention Education:  Education Completed;Esli Jernigan, grandmother, [phone],  has been identified by the patient as the family member/significant other with whom the patient will be residing, and identified as the person(s) who will aid the patient in the event of a mental health crisis (suicidal ideations/suicide attempt).  With written consent from the patient, the family member/significant other has been provided the following suicide prevention education, prior to the and/or following the discharge of the patient.  The suicide prevention education provided includes the following:  Suicide risk factors  Suicide prevention and interventions  National Suicide Hotline telephone number  [facility] assessment telephone number  [facility] Emergency Assistance 911  [facility] and/or Residential Mobile Crisis Unit telephone number  Request made of family/significant other to:  Remove weapons (e.g., guns, rifles, knives), all items previously/currently identified as safety concern.  No guns in the home, per grandmother.   Remove drugs/medications (over-the-counter, prescriptions, illicit drugs), all items previously/currently identified as a safety concern.  The family member/significant other verbalizes understanding of the suicide prevention education information provided.  The family member/significant other agrees to remove the items of safety concern listed above.    [name], LCSW 09/01/2018, 12:49 PM

## 2018-09-02 NOTE — Progress Notes (Addendum)
[name] MD Progress Note  09/02/2018 11:28 AM Jacob [name]  MRN:  [id number]  Evaluation: Jacob [name] seen resting in bed.  He is awake alert and oriented x3.  Reports" I felt like I wanted to posion myself.".  Denies paranoia ideations during this assessment.  Patient presents flat and guarded. Denies suicidal or homicidal ideations.  Denies auditory visual hallucinations.  Chart reviewed patient scheduled for Abilify injection.  Patient reports taking medications as prescribed and tolerating them well.  Patient is hopeful of discharge soon as he reports feeling "bored".  Reports a good appetite. Jacob [name] states he is resting well throughout the night.  Support, encouragement and  reassurance was provided.   Jacob [name] is a 27 y/o M with history of MDD without psychotic features who was admitted voluntarily from inpatient medicine unit where he was admitted and medically stabilized after presenting to the ED with suicide attempt via ingestion of rat poison. Pt has relevant history of admission to [facility] in April 2019 for symptoms of depression but he did not follow through with outpatient follow up. After medical clearance, pt was transferred to [facility] for additional treatment and stabilization. He was started on trial of prozac and abilify for depression, thought organization, and mood stabilization. Doses of prozac and abilify have been titrated up during his stay. Pt has been reporting incremental improvement of his presenting symptoms.    Principal Problem: Severe recurrent major depression without psychotic features (HCC) Diagnosis:   Patient Active Problem List   Diagnosis Date Noted  . Suicidal behavior with attempted self-injury (HCC) [T14.91XA] 08/26/2018  . 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 Severe recurrent major depression without psychotic features (HCC) [F33.2] 02/04/2018   Total Time spent with patient: 15 minutes  Past Psychiatric History: See  H&P  Past Medical History: History reviewed. No pertinent past medical history. History reviewed. No pertinent surgical history. Family History: History reviewed. No pertinent family history.  Family Psychiatric  History: See H&P  Social History:  Social History   Substance and Sexual Activity  Alcohol Use No     Social History   Substance and Sexual Activity  Drug Use Not Currently    Social History   Socioeconomic History  . Marital status: Single    Spouse name: Not on file  . Number of children: Not on file  . Years of education: Not on file  . Highest education level: Not on file  Occupational History  . Not on file  Social Needs  . Financial resource strain: Not on file  . Food insecurity:    Worry: Not on file    Inability: Not on file  . Transportation needs:    Medical: Not on file    Non-medical: Not on file  Tobacco Use  . Smoking status: Never Smoker  . Smokeless tobacco: Never Used  Substance and Sexual Activity  . Alcohol use: No  . Drug use: Not Currently  . Sexual activity: Not on file  Lifestyle  . Physical activity:    Days per week: Not on file    Minutes per session: Not on file  . Stress: Not on file  Relationships  . Social connections:    Talks on phone: Not on file    Gets together: Not on file    Attends religious service: Not on file    Active member of club or organization: Not on file    Attends meetings of clubs or organizations: Not on file  Relationship status: Not on file  Other Topics Concern  . Not on file  Social History Narrative  . Not on file   Additional Social History:   Sleep: Good  Appetite:  Good  Current Medications: Current Facility-Administered Medications  Medication Dose Route Frequency Provider Last Rate Last Dose  . acetaminophen (TYLENOL) tablet 650 mg  650 mg Oral Q6H PRN Fransisca Kaufmann A, NP      . ARIPiprazole (ABILIFY) tablet 15 mg  15 mg Oral Daily Micheal Likens, MD   15 mg at  09/02/18 0807  . ARIPiprazole ER (ABILIFY MAINTENA) injection 400 mg  400 mg Intramuscular Q28 days Jolyne Loa T, MD   400 mg at 09/01/18 1415  . famotidine (PEPCID) tablet 20 mg  20 mg Oral Daily Fransisca Kaufmann A, NP   20 mg at 09/02/18 0807  . FLUoxetine (PROZAC) capsule 20 mg  20 mg Oral Daily Micheal Likens, MD   20 mg at 09/02/18 0807  . hydrOXYzine (ATARAX/VISTARIL) tablet 50 mg  50 mg Oral Q6H PRN Micheal Likens, MD   50 mg at 09/01/18 2140  . LORazepam (ATIVAN) tablet 1 mg  1 mg Oral Q6H PRN Micheal Likens, MD       Or  . LORazepam (ATIVAN) injection 1 mg  1 mg Intramuscular Q6H PRN Micheal Likens, MD      . MUSCLE RUB CREA   Topical PRN Fransisca Kaufmann A, NP      . OLANZapine zydis (ZYPREXA) disintegrating tablet 5 mg  5 mg Oral Q8H PRN Micheal Likens, MD       Or  . ziprasidone (GEODON) injection 20 mg  20 mg Intramuscular Q12H PRN Micheal Likens, MD      . ondansetron [facility]) tablet 4 mg  4 mg Oral Q8H PRN Thermon Leyland, NP      . senna-docusate (Senokot-S) tablet 1 tablet  1 tablet Oral QHS PRN Thermon Leyland, NP      . traZODone (DESYREL) tablet 50 mg  50 mg Oral QHS,MR X 1 Kerry Hough, [name]   50 mg at 09/01/18 2139   Lab Results: No results found for this or any previous visit (from the past 48 hour(s)).  Blood Alcohol level:  Lab Results  Component Value Date   ETH <10 08/26/2018   ETH <10 02/03/2018   Metabolic Disorder Labs: Lab Results  Component Value Date   HGBA1C 6.1 (H) 02/06/2018   MPG 128.37 02/06/2018   No results found for: PROLACTIN Lab Results  Component Value Date   CHOL 204 (H) 02/06/2018   TRIG 43 02/06/2018   HDL 81 02/06/2018   CHOLHDL 2.5 02/06/2018   VLDL 9 02/06/2018   LDLCALC 114 (H) 02/06/2018   Physical Findings: AIMS: Facial and Oral Movements Muscles of Facial Expression: None, normal Lips and Perioral Area: None, normal Jaw: None, normal Tongue: None,  normal,Extremity Movements Upper (arms, wrists, hands, fingers): None, normal Lower (legs, knees, ankles, toes): None, normal, Trunk Movements Neck, shoulders, hips: None, normal, Overall Severity Severity of abnormal movements (highest score from questions above): None, normal Incapacitation due to abnormal movements: None, normal Patient's awareness of abnormal movements (rate only patient's report): No Awareness, Dental Status Current problems with teeth and/or dentures?: No Does patient usually wear dentures?: No  CIWA:    COWS:     Musculoskeletal: Strength & Muscle Tone: within normal limits Gait & Station: normal Patient leans: N/A  Psychiatric Specialty Exam:  Physical Exam  Nursing note and vitals reviewed. Cardiovascular: Normal rate.  Genitourinary: Penis normal.  Neurological: He is alert.  Psychiatric: He has a normal mood and affect. His behavior is normal.    Review of Systems  Psychiatric/Behavioral: Negative for depression, hallucinations and suicidal ideas. The patient is not nervous/anxious and does not have insomnia.   All other systems reviewed and are negative.   Blood pressure 139/88, pulse 85, temperature 98.4 F (36.9 C), temperature source Oral, resp. rate 20, height 5\' 10"  (1.778 m), weight 57.2 kg, SpO2 100 %.Body mass index is 18.08 kg/m.  General Appearance: Casual and Fairly Groomed  Eye Contact:  Good  Speech:  Clear and Coherent and Normal Rate  Volume:  Normal  Mood:  Euthymic  Affect:  Blunt and Congruent  Thought Process:  Coherent and Goal Directed  Orientation:  Full (Time, Place, and Person)  Thought Content:  Logical  Suicidal Thoughts:  No  Homicidal Thoughts:  No  Memory:  Immediate;   Fair Recent;   Fair Remote;   Fair  Judgement:  Fair  Insight:  Lacking  Psychomotor Activity:  Normal  Concentration:  Concentration: Fair  Recall:  [employer] of Knowledge:  Fair  Language:  Fair  Akathisia:  No  Handed:    AIMS (if  indicated):     Assets:  Resilience Social Support  ADL's:  Intact  Cognition:  WNL  Sleep:  Number of Hours: 6.75   Treatment Plan Summary: Daily contact with patient to assess and evaluate symptoms and progress in treatment and Medication management   -Continue with current treatment plan on 09/02/2018 as listed below except were noted.   -MDD, recurrent, severe, without psychotic features             -Continue prozac 20mg  po qDay             -Continue abilify 15mg  po qDay   - abilify maintena 400 mg IM q30 days starting today 09/01/18  -anxiety             -Continue vistaril 50mg  po q6h prn anxiety  -insomnia             -Continue trazodone 50mg  po qhs prn insomnia (may repeat x1 prn insomnia)  -agitation             -Continue zydis 5mg  po q8h prn agitation             -Continue geodon 20mg  IM q12h prn severe agitation             -Continue ativan 1mg  po/IM q6h prn agitation  -Encourage participation in groups and therapeutic milieu -disposition planning will be ongoing  [name], NP, PMHNP, FNP-BC 09/02/2018, 11:28 AM   .Agree with NP Progress Note

## 2018-09-02 NOTE — [facility] Group Notes (Signed)
[facility] Group Notes: (Clinical Social Work)   09/02/2018      Type of Therapy:  Group Therapy   Participation Level:  Did Not Attend despite MHT prompting   Ender Rorke Grossman-Orr, LCSW 09/02/2018, 1:11 PM     

## 2018-09-02 NOTE — [facility] Group Notes (Signed)
[facility] Group Notes:  (Nursing)  Date:  09/02/2018  Time: 1 30 PM Type of Therapy:  Nurse Education  Participation Level:  Active  Participation Quality:  Appropriate and Attentive  Affect:  Appropriate  Cognitive:  Alert and Appropriate  Insight:  Appropriate  Engagement in Group:  Engaged  Modes of Intervention:  Discussion and Education  Summary of Progress/Problems: Nurse led group played a non competitive learning/communication board game that fosters listening skills as well as self expression.  Shela Nevin 09/02/2018, 4:35 PM

## 2018-09-02 NOTE — Progress Notes (Signed)
DAR NOTE: Patient presents with bight affect and pleasant moo on the unit today. Pt has been cooperative with care, been participating in all the activities. Reports fair sleep, fair appetite, normal energy, and good concentration. Denies pain, SI/HI, auditory and visual hallucinations.  Rates depression at 3, hopelessness at 3, and anxiety at 4.  Maintained on routine safety checks.  Medications given as prescribed.  Support and encouragement offered as needed.  Attended group and participated.  States goal for today is " have a great day avery day."  Patient observed socializing with peers in the dayroom.  Offered no complaint.

## 2018-09-02 NOTE — Progress Notes (Signed)
Patient has been up in the dayroom watching tv. His grandmother visited him on tonight. He attended group this evening and has voiced no complaints. Patient currently denies having pain, -si/hi/a/v hall. He reports that he has a ticket and he has been doing his community service work before he has to go to court. He is hopeful that it will turn out well for him. Support and encouragement offered, safety maintained on unit, will continue to monitor.

## 2018-09-02 NOTE — Progress Notes (Signed)
Patient has been up and active on the unit, attended group this evening and has voiced no complaints. Patient currently denies having pain, -si/hi/a/v hall. Support and encouragement offered, safety maintained on unit, will continue to monitor.  

## 2018-09-02 NOTE — Progress Notes (Signed)
Adult Psychoeducational Group Note  Date:  09/02/2018 Time:  8:30 PM  Group Topic/Focus:  Wrap-Up Group:   The focus of this group is to help patients review their daily goal of treatment and discuss progress on daily workbooks.  Participation Level:  Active  Participation Quality:  Appropriate  Affect:  Appropriate  Cognitive:  Appropriate  Insight: Appropriate  Engagement in Group:  Engaged  Modes of Intervention:  Discussion  Additional Comments:  The patient expressed that he rates today a 9.The patient also said that he attended group.  Octavio Manns 09/02/2018, 8:30 PM

## 2018-09-03 NOTE — [facility] Group Notes (Signed)
[facility] LCSW Group Therapy Note  Date/Time:  09/03/2018  11:00AM-12:00PM  Type of Therapy and Topic:  Group Therapy:  Music and Mood  Participation Level:  Active   Description of Group: In this process group, members listened to a variety of genres of music and identified that different types of music evoke different responses.  Patients were encouraged to identify music that was soothing for them and music that was energizing for them.  Patients discussed how this knowledge can help with wellness and recovery in various ways including managing depression and anxiety as well as encouraging healthy sleep habits.    Therapeutic Goals: 1. Patients will explore the impact of different varieties of music on mood 2. Patients will verbalize the thoughts they have when listening to different types of music 3. Patients will identify music that is soothing to them as well as music that is energizing to them 4. Patients will discuss how to use this knowledge to assist in maintaining wellness and recovery 5. Patients will explore the use of music as a coping skill  Summary of Patient Progress:  At the beginning of group, patient expressed that he felt "great."  He interacted with other group members and the group leader throughout the music playing and was insightful.  He said he still felt "great" when group was over, but was smiling more broadly at that point.  Therapeutic Modalities: Solution Focused Brief Therapy Activity   Ambrose Mantle, LCSW

## 2018-09-03 NOTE — Progress Notes (Signed)
D. Pt visible in the milieu- interacting appropriately with peers- friendly upon approach. Per pt's self inventory, pt rates his depression, hopelessness and anxiety all 4's. Pt writes that his most important goal today is "connecting with my inner goals". Pt currently denies SI/HI and AV hallucinations. A. Labs and vitals monitored. Pt compliant with  medications. Pt supported emotionally and encouraged to express concerns and ask questions.   R. Pt remains safe with 15 minute checks. Will continue POC.

## 2018-09-03 NOTE — Progress Notes (Addendum)
[facility] MD Progress Note  09/03/2018 11:00 AM Jacob [name]  MRN:  [id number]  Evaluation: Ardit seen sitting in day room interacting with peers and staff.  Presents pleasant calm and cooperative.  Denies suicidal or homicidal ideations.  Patient reports multiple discharge on tomorrow as he states he has to return to work Tuesday at [employer].  Denies auditory or visual hallucinations.  Patient appears to be more engaged and reactive during this discussion.    Reports was helpful since his admission.  Denies thoughts or intent for self-harm.  Reports taking medications as prescribed and tolerating them well.  Patient was initiated on Abilify maintein a on 11/1.  Patient to continue taking oral medications denies medication side effects.  Reports a good appetite.  States he is resting well.  Support,  encouragement reassurance was provided.   Jacob [name] is a 27 y/o M with history of MDD without psychotic features who was admitted voluntarily from inpatient medicine unit where he was admitted and medically stabilized after presenting to the ED with suicide attempt via ingestion of rat poison. Pt has relevant history of admission to [facility] in April 2019 for symptoms of depression but he did not follow through with outpatient follow up. After medical clearance, pt was transferred to [facility] for additional treatment and stabilization. He was started on trial of prozac and abilify for depression, thought organization, and mood stabilization. Doses of prozac and abilify have been titrated up during his stay. Pt has been reporting incremental improvement of his presenting symptoms.    Principal Problem: Severe recurrent major depression without psychotic features (HCC) Diagnosis:   Patient Active Problem List   Diagnosis Date Noted  . Suicidal behavior with attempted self-injury (HCC) [T14.91XA] 08/26/2018  . 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 Severe recurrent major depression  without psychotic features (HCC) [F33.2] 02/04/2018   Total Time spent with patient: 15 minutes  Past Psychiatric History: See H&P  Past Medical History: History reviewed. No pertinent past medical history. History reviewed. No pertinent surgical history. Family History: History reviewed. No pertinent family history.  Family Psychiatric  History: See H&P  Social History:  Social History   Substance and Sexual Activity  Alcohol Use No     Social History   Substance and Sexual Activity  Drug Use Not Currently    Social History   Socioeconomic History  . Marital status: Single    Spouse name: Not on file  . Number of children: Not on file  . Years of education: Not on file  . Highest education level: Not on file  Occupational History  . Not on file  Social Needs  . Financial resource strain: Not on file  . Food insecurity:    Worry: Not on file    Inability: Not on file  . Transportation needs:    Medical: Not on file    Non-medical: Not on file  Tobacco Use  . Smoking status: Never Smoker  . Smokeless tobacco: Never Used  Substance and Sexual Activity  . Alcohol use: No  . Drug use: Not Currently  . Sexual activity: Not on file  Lifestyle  . Physical activity:    Days per week: Not on file    Minutes per session: Not on file  . Stress: Not on file  Relationships  . Social connections:    Talks on phone: Not on file    Gets together: Not on file    Attends religious service: Not  on file    Active member of club or organization: Not on file    Attends meetings of clubs or organizations: Not on file    Relationship status: Not on file  Other Topics Concern  . Not on file  Social History Narrative  . Not on file   Additional Social History:   Sleep: Good  Appetite:  Good  Current Medications: Current Facility-Administered Medications  Medication Dose Route Frequency Provider Last Rate Last Dose  . acetaminophen (TYLENOL) tablet 650 mg  650 mg Oral  Q6H PRN Fransisca Kaufmann A, NP      . ARIPiprazole (ABILIFY) tablet 15 mg  15 mg Oral Daily Micheal Likens, MD   15 mg at 09/03/18 0732  . ARIPiprazole ER (ABILIFY MAINTENA) injection 400 mg  400 mg Intramuscular Q28 days Jolyne Loa T, MD   400 mg at 09/01/18 1415  . famotidine (PEPCID) tablet 20 mg  20 mg Oral Daily Fransisca Kaufmann A, NP   20 mg at 09/03/18 0732  . FLUoxetine (PROZAC) capsule 20 mg  20 mg Oral Daily Micheal Likens, MD   20 mg at 09/03/18 0732  . hydrOXYzine (ATARAX/VISTARIL) tablet 50 mg  50 mg Oral Q6H PRN Micheal Likens, MD   50 mg at 09/01/18 2140  . LORazepam (ATIVAN) tablet 1 mg  1 mg Oral Q6H PRN Micheal Likens, MD       Or  . LORazepam (ATIVAN) injection 1 mg  1 mg Intramuscular Q6H PRN Micheal Likens, MD      . MUSCLE RUB CREA   Topical PRN Fransisca Kaufmann A, NP      . OLANZapine zydis (ZYPREXA) disintegrating tablet 5 mg  5 mg Oral Q8H PRN Micheal Likens, MD       Or  . ziprasidone (GEODON) injection 20 mg  20 mg Intramuscular Q12H PRN Micheal Likens, MD      . ondansetron [facility]) tablet 4 mg  4 mg Oral Q8H PRN Thermon Leyland, NP      . senna-docusate (Senokot-S) tablet 1 tablet  1 tablet Oral QHS PRN Thermon Leyland, NP      . traZODone (DESYREL) tablet 50 mg  50 mg Oral QHS,MR X 1 Kerry Hough, [name]   50 mg at 09/02/18 2009   Lab Results: No results found for this or any previous visit (from the past 48 hour(s)).  Blood Alcohol level:  Lab Results  Component Value Date   ETH <10 08/26/2018   ETH <10 02/03/2018   Metabolic Disorder Labs: Lab Results  Component Value Date   HGBA1C 6.1 (H) 02/06/2018   MPG 128.37 02/06/2018   No results found for: PROLACTIN Lab Results  Component Value Date   CHOL 204 (H) 02/06/2018   TRIG 43 02/06/2018   HDL 81 02/06/2018   CHOLHDL 2.5 02/06/2018   VLDL 9 02/06/2018   LDLCALC 114 (H) 02/06/2018   Physical Findings: AIMS: Facial and Oral  Movements Muscles of Facial Expression: None, normal Lips and Perioral Area: None, normal Jaw: None, normal Tongue: None, normal,Extremity Movements Upper (arms, wrists, hands, fingers): None, normal Lower (legs, knees, ankles, toes): None, normal, Trunk Movements Neck, shoulders, hips: None, normal, Overall Severity Severity of abnormal movements (highest score from questions above): None, normal Incapacitation due to abnormal movements: None, normal Patient's awareness of abnormal movements (rate only patient's report): No Awareness, Dental Status Current problems with teeth and/or dentures?: No Does patient usually wear dentures?: No  CIWA:    COWS:     Musculoskeletal: Strength & Muscle Tone: within normal limits Gait & Station: normal Patient leans: N/A  Psychiatric Specialty Exam: Physical Exam  Nursing note and vitals reviewed. Cardiovascular: Normal rate.  Genitourinary: Penis normal.  Neurological: He is alert.  Psychiatric: He has a normal mood and affect. His behavior is normal.    Review of Systems  Psychiatric/Behavioral: Negative for depression, hallucinations and suicidal ideas. The patient is not nervous/anxious and does not have insomnia.   All other systems reviewed and are negative.   Blood pressure 131/89, pulse 85, temperature 98.5 F (36.9 C), temperature source Oral, resp. rate 20, height 5\' 10"  (1.778 m), weight 57.2 kg, SpO2 100 %.Body mass index is 18.08 kg/m.  General Appearance: Casual  Eye Contact:  Good  Speech:  Clear and Coherent and Normal Rate  Volume:  Normal  Mood:  Euthymic  Affect:  Congruent  Thought Process:  Coherent and Goal Directed  Orientation:  Full (Time, Place, and Person)  Thought Content:  Logical  Suicidal Thoughts:  No  Homicidal Thoughts:  No  Memory:  Immediate;   Fair Recent;   Fair Remote;   Fair  Judgement:  Fair  Insight:  Lacking  Psychomotor Activity:  Normal  Concentration:  Concentration: Fair  Recall:   [employer] of Knowledge:  Fair  Language:  Fair  Akathisia:  No  Handed:    AIMS (if indicated):     Assets:  Resilience Social Support  ADL's:  Intact  Cognition:  WNL  Sleep:  Number of Hours: 7.75   Treatment Plan Summary: Daily contact with patient to assess and evaluate symptoms and progress in treatment and Medication management   -Continue with current treatment plan on 09/03/2018 as listed below except were noted.   -MDD, recurrent, severe, without psychotic features             -Continue prozac 20mg  po qDay             -Continue abilify 15mg  po qDay   - abilify maintena 400 mg IM q30 days starting today 09/01/18  -anxiety             -Continue vistaril 50mg  po q6h prn anxiety  -insomnia             -Continue trazodone 50mg  po qhs prn insomnia (may repeat x1 prn insomnia)  -agitation             -Continue zydis 5mg  po q8h prn agitation             -Continue geodon 20mg  IM q12h prn severe agitation             -Continue ativan 1mg  po/IM q6h prn agitation  -Encourage participation in groups and therapeutic milieu -disposition planning will be ongoing  [name], NP, PMHNP, FNP-BC 09/03/2018, 11:00 AM    .Agree with NP Progress Note

## 2018-09-03 NOTE — [facility] Group Notes (Addendum)
[facility] Group Notes:  (Nursing)  Date:  09/03/2018  Time: 945 Type of Therapy:  Nurse Education  Participation Level:  Active  Participation Quality:  Appropriate  Affect:  Appropriate  Cognitive:  Alert and Appropriate  Insight:  Improving  Engagement in Group:  Improving  Modes of Intervention:  Discussion and Education  Summary of Progress/Problems: Nurse led Orientation/ Goals group Shela Nevin 09/03/2018, 10:17 AM

## 2018-09-04 MED ORDER — FAMOTIDINE 20 MG PO TABS: 20.0000 mg | ORAL_TABLET | Freq: Every day | ORAL | 1 refills | Status: DC

## 2018-09-04 MED ORDER — ARIPIPRAZOLE 15 MG PO TABS: 15.0000 mg | ORAL_TABLET | Freq: Every day | ORAL | 0 refills | Status: DC

## 2018-09-04 MED ORDER — HYDROXYZINE HCL 50 MG PO TABS: 50.0000 mg | ORAL_TABLET | Freq: Four times a day (QID) | ORAL | 0 refills | Status: DC | PRN

## 2018-09-04 MED ORDER — ARIPIPRAZOLE ER 400 MG IM SRER: 400.0000 mg | INTRAMUSCULAR | 5 refills | Status: DC

## 2018-09-04 MED ORDER — TRAZODONE HCL 50 MG PO TABS: 50.0000 mg | ORAL_TABLET | Freq: Every evening | ORAL | 0 refills | Status: DC | PRN

## 2018-09-04 MED ORDER — FLUOXETINE HCL 20 MG PO CAPS: 20.0000 mg | ORAL_CAPSULE | Freq: Every day | ORAL | 2 refills | Status: DC

## 2018-09-04 MED ORDER — FLUOXETINE HCL 20 MG PO CAPS: 20.0000 mg | ORAL_CAPSULE | Freq: Every day | ORAL | 0 refills | Status: DC

## 2018-09-04 MED ORDER — FAMOTIDINE 20 MG PO TABS: 20.0000 mg | ORAL_TABLET | Freq: Every day | ORAL | 0 refills | Status: DC

## 2018-09-04 NOTE — Progress Notes (Signed)
Recreation Therapy Notes  INPATIENT RECREATION TR PLAN  Patient Details Name: Jacob [name] MRN: [id number] DOB: 11/13/1990 Today's Date: 09/04/2018  Rec Therapy Plan Is patient appropriate for Therapeutic Recreation?: Yes Treatment times per week: about 3 days Estimated Length of Stay: 5-7 days TR Treatment/Interventions: Group participation (Comment)  Discharge Criteria Pt will be discharged from therapy if:: Discharged Treatment plan/goals/alternatives discussed and agreed upon by:: Patient/family  Discharge Summary Short term goals set: See patient care plan Short term goals met: Complete Progress toward goals comments: Groups attended Which groups?: Communication, Stress management, Leisure education, Coping skills, Other (Comment)(Team building) Reason goals not met: None Therapeutic equipment acquired: N/A Reason patient discharged from therapy: Discharge from hospital Pt/family agrees with progress & goals achieved: Yes Date patient discharged from therapy: 09/04/18    Victorino Sparrow, LRT/CTRS  Ria Comment, Temeka Pore A 09/04/2018, 11:56 AM

## 2018-09-04 NOTE — Discharge Summary (Signed)
Physician Discharge Summary Note  Patient:  Jacob [name] is an 27 y.o., male MRN:  [id number] DOB:  04/29/1991 Patient phone:  [phone] (home)  Patient address:   [address] [place] [address],  Total Time spent with patient: Greater than 30 minutes  Date of Admission:  08/28/2018  Date of Discharge: 09-04-18  Reason for Admission: Suicide attempt via ingestion of rat poison.    Principal Problem: Severe recurrent major depression without psychotic features [facility])  Discharge Diagnoses: There are no active problems to display for this patient.  Past Psychiatric History: Severe, MDD  Past Medical History: History reviewed. No pertinent past medical history. History reviewed. No pertinent surgical history. Family History: History reviewed. No pertinent family history.  Family Psychiatric  History: see H&P  Social History:  Social History   Substance and Sexual Activity  Alcohol Use No     Social History   Substance and Sexual Activity  Drug Use Not Currently    Social History   Socioeconomic History  . Marital status: Single    Spouse name: Not on file  . Number of children: Not on file  . Years of education: Not on file  . Highest education level: Not on file  Occupational History  . Not on file  Social Needs  . Financial resource strain: Not on file  . Food insecurity:    Worry: Not on file    Inability: Not on file  . Transportation needs:    Medical: Not on file    Non-medical: Not on file  Tobacco Use  . Smoking status: Never Smoker  . Smokeless tobacco: Never Used  Substance and Sexual Activity  . Alcohol use: No  . Drug use: Not Currently  . Sexual activity: Not on file  Lifestyle  . Physical activity:    Days per week: Not on file    Minutes per session: Not on file  . Stress: Not on file  Relationships  . Social connections:    Talks on phone: Not on file    Gets together: Not on file    Attends religious service: Not on  file    Active member of club or organization: Not on file    Attends meetings of clubs or organizations: Not on file    Relationship status: Not on file  Other Topics Concern  . Not on file  Social History Narrative  . Not on file   Hospital Course: (Per Md's admission notes): Jacob [name] is a 27 y/o M with history of MDD without psychotic features who was admitted voluntarily from inpatient medicine unit where he was admitted and medically stabilized after presenting to the ED with suicide attempt via ingestion of rat poison. Pt has relevant history of admission to [facility] in April 2019 for symptoms of depression but he did not follow through with outpatient follow up. After medical clearance, pt was transferred to [facility] for additional treatment and stabilization. Upon initial interview, pt shares, "I just have an unstable mindset. I'm trying to stay on the path. I'm not really controlling my stability. It's kind of hard to adapt." Pt is generally vague and circumstantial in responses, but he is pleasant and cooperative with interview, and he is able to be re-directed to give more precise responses. He describes worsening depression associated with multiple stressors of upcoming court hearing for charge of DUI and having difficulty accessing his daughter (age 67.5) whom is in foster care in [place]. Pt reports that his actions  of consuming rat poison was indeed an attempt to end his life, but he explains that it was impulsive and he regrets his actions. He endorses depression symptoms of depressed mood, anhedonia, guilty feelings, fluctuant energy, and poor concentration. He denies SI/HI/AH/VH. He had reported "seeing things" while in the ED as per notes from inpatient treatment team, but he denies all history of psychotic symptoms at this time. He denies symptoms of mania/hypomania, OCD, and PTSD. He drinks 1 beer monthly and he denies all other illicit substance use including tobacco.  After the above  admission assessment, Md's presenting symptoms were noted. The medication regimen targeting those symptoms were dicussed & initiated.  He received & was discharged on; Prozac 20 mg for depression, Hydroxyzine 50 mg prn for anxiety, Abilify 15 mg for mood control, Abilify ER injectable 400 mg Q 28 days for mood control (Due on 09-29-18) & Trazodone 50 mg prn for insomnia. He was enrolled & participated in the group counseling sessions being offered & held on this unit. He learned coping skills. He also received/discharged on other medication regimen for the other pre-existing medical issues presented. He tolerated his treatment regimen without any adverse effects or reactions reported.   Jacob [name] is seen today by the attending psychiatrist for discharge. It was observed that his symptoms responded well to his treatment regimen. He says he has normal anxiety about going home. He is not overwhelmed by this. He is looking forward to working on his mental health issues. Not expressing any delusions today. No hallucinations. Feels in control of himself. There are no fantasy about suicide lately. No suicidal thoughts. There are no thoughts of violence expressed. Does not feel depressed. No evidence of mania.  The nursing staff reports that patient has been appropriate on the unit. Patient has been interacting well with peers. No behavioral issues. Patient has not voiced any suicidal thoughts. Patient has not been observed to be internally stimulated or preoccupied. Patient has been adherent with the treatment recommendations. Patient has been tolerating his medications well. No reported adverse effects or reactions.   Patient was discussed at the treatment team meeting this morning. The team members feel that patient is back to his baseline level of function. The team agrees with plan to discharge patient today to continue mental health health care on an outpatient basis as noted below. He was provided with all  the necessary information to make these apointment without problems. He left [facility] with all personal belongings in no apparent distress.  Physical Findings: AIMS: Facial and Oral Movements Muscles of Facial Expression: None, normal Lips and Perioral Area: None, normal Jaw: None, normal Tongue: None, normal,Extremity Movements Upper (arms, wrists, hands, fingers): None, normal Lower (legs, knees, ankles, toes): None, normal, Trunk Movements Neck, shoulders, hips: None, normal, Overall Severity Severity of abnormal movements (highest score from questions above): None, normal Incapacitation due to abnormal movements: None, normal Patient's awareness of abnormal movements (rate only patient's report): No Awareness, Dental Status Current problems with teeth and/or dentures?: No Does patient usually wear dentures?: No  CIWA:    COWS:     Musculoskeletal: Strength & Muscle Tone: within normal limits Gait & Station: normal Patient leans: N/A  Psychiatric Specialty Exam: Physical Exam  Nursing note and vitals reviewed. Constitutional: He appears well-developed.  HENT:  Head: Normocephalic.  Eyes: Pupils are equal, round, and reactive to light.  Neck: Normal range of motion.  Cardiovascular: Normal rate.  Respiratory: Effort normal.  GI: Soft.  Genitourinary:  Genitourinary  Comments: Deferred  Musculoskeletal: Normal range of motion.  Neurological: He is alert.  Skin: Skin is warm.    Review of Systems  Constitutional: Negative.   HENT: Negative.   Eyes: Negative.   Respiratory: Negative.   Cardiovascular: Negative.  Negative for chest pain and palpitations.  Gastrointestinal: Negative.  Negative for abdominal pain, heartburn, nausea and vomiting.  Genitourinary: Negative.   Musculoskeletal: Negative.   Skin: Negative.   Neurological: Negative.   Endo/Heme/Allergies: Negative.   Psychiatric/Behavioral: Positive for depression (Stable). Negative for hallucinations, memory  loss, substance abuse and suicidal ideas. The patient has insomnia (Stable). The patient is not nervous/anxious.     Blood pressure (!) 122/94, pulse 76, temperature 98.9 F (37.2 C), temperature source Oral, resp. rate 20, height 5\' 10"  (1.778 m), weight 57.2 kg, SpO2 100 %.Body mass index is 18.08 kg/m.  See Md's SRA  Have you used any form of tobacco in the last 30 days? (Cigarettes, Smokeless Tobacco, Cigars, and/or Pipes): No  Has this patient used any form of tobacco in the last 30 days? (Cigarettes, Smokeless Tobacco, Cigars, and/or Pipes): N/A  Blood Alcohol level:  Lab Results  Component Value Date   ETH <10 08/26/2018   ETH <10 02/03/2018    Metabolic Disorder Labs:  Lab Results  Component Value Date   HGBA1C 6.1 (H) 02/06/2018   MPG 128.37 02/06/2018   No results found for: PROLACTIN Lab Results  Component Value Date   CHOL 204 (H) 02/06/2018   TRIG 43 02/06/2018   HDL 81 02/06/2018   CHOLHDL 2.5 02/06/2018   VLDL 9 02/06/2018   LDLCALC 114 (H) 02/06/2018   See Psychiatric Specialty Exam and Suicide Risk Assessment completed by Attending Physician prior to discharge.  Discharge destination:  Home  Is patient on multiple antipsychotic therapies at discharge:  No   Has Patient had three or more failed trials of antipsychotic monotherapy by history:  No  Recommended Plan for Multiple Antipsychotic Therapies: NA Discharge Instructions    Discharge patient   Complete by:  As directed    Discharge disposition:  01-Home or Self Care   Discharge patient date:  09/04/2018     Allergies as of 09/04/2018   No Known Allergies     Medication List    STOP taking these medications   amLODipine 5 MG tablet Commonly known as:  NORVASC     TAKE these medications     Indication  ARIPiprazole 15 MG tablet Commonly known as:  ABILIFY Take 1 tablet (15 mg total) by mouth daily for 14 days. Start taking on:  09/05/2018  Indication:  MIXED BIPOLAR AFFECTIVE DISORDER    ARIPiprazole ER 400 MG Srer injection Commonly known as:  ABILIFY MAINTENA Inject 2 mLs (400 mg total) into the muscle every 28 (twenty-eight) days. (Due on 09-29-18): For mood control Start taking on:  09/29/2018  Indication:  Mood control   famotidine 20 MG tablet Commonly known as:  PEPCID Take 1 tablet (20 mg total) by mouth daily. For acid reflux Start taking on:  09/05/2018  Indication:  Gastroesophageal Reflux Disease   FLUoxetine 20 MG capsule Commonly known as:  PROZAC Take 1 capsule (20 mg total) by mouth daily. For depression Start taking on:  09/05/2018 What changed:    medication strength  how much to take  additional instructions  Indication:  Major Depressive Disorder   hydrOXYzine 50 MG tablet Commonly known as:  ATARAX/VISTARIL Take 1 tablet (50 mg total) by mouth every 6 (  six) hours as needed for anxiety. What changed:    medication strength  how much to take  when to take this  reasons to take this  Indication:  Feeling Anxious, sleep   traZODone 50 MG tablet Commonly known as:  DESYREL Take 1 tablet (50 mg total) by mouth at bedtime as needed for sleep.  Indication:  Trouble Sleeping      Follow-up Information    [facility] Go in 3 day(s).   Specialty:  Professional Counselor Why:  Please attend a walk in appt between 8:30am-12noon or between 1:00pm-2:30pm.  Please request therapy and medication management services. Contact information: [facility] [address] [place] [address] [phone]          Follow-up recommendations:  Activity:  As tolerated Diet: As recommended by your primary care doctor. Keep all scheduled follow-up appointments as recommended.  Comments: Patient is instructed prior to discharge to: Take all medications as prescribed by his/her mental healthcare provider. Report any adverse effects and or reactions from the medicines to his/her outpatient  provider promptly. Patient has been instructed & cautioned: To not engage in alcohol and or illegal drug use while on prescription medicines. In the event of worsening symptoms, patient is instructed to call the crisis hotline, 911 and or go to the nearest ED for appropriate evaluation and treatment of symptoms. To follow-up with his/her primary care provider for your other medical issues, concerns and or health care needs.   Signed: Armandina Stammer, NP, PMHNP, FNP 09/04/2018, 11:53 AM

## 2018-09-04 NOTE — Plan of Care (Signed)
Pt was able to identify coping skills at completion of recreation therapy group sessions.   Virgen Belland, LRT/CTRS 

## 2018-09-04 NOTE — [facility] Suicide Risk Assessment (Signed)
[facility] Discharge Suicide Risk Assessment   Principal Problem: Severe recurrent major depression without psychotic features [facility]) Discharge Diagnoses: There are no active problems to display for this patient.   Total Time spent with patient: 30 minutes  Musculoskeletal: Strength & Muscle Tone: within normal limits Gait & Station: normal Patient leans: n/a   Psychiatric Specialty Exam: ROS  Blood pressure (!) 122/94, pulse 76, temperature 98.9 F (37.2 C), temperature source Oral, resp. rate 20, height 5\' 10"  (1.778 m), weight 57.2 kg, SpO2 100 %.Body mass index is 18.08 kg/m.  General Appearance: Casual  Eye Contact::  Good  Speech:  Clear and Coherent409  Volume:  Normal  Mood:  Euthymic  Affect:  Congruent  Thought Process:  Coherent  Orientation:  full  Thought Content:  Logical  Suicidal Thoughts:  No  Homicidal Thoughts:  No  Memory:  Immediate;   Good  Judgement:  Good  Insight:  Good  Psychomotor Activity:  Normal  Concentration:  Good  Recall:  Good  Fund of Knowledge:Good  Language: Good  Akathisia:  NA  Handed:  Right  AIMS (if indicated):     Assets:  Communication Skills  Sleep:  Number of Hours: 6.75  Cognition: WNL  ADL's:  Intact   Mental Status Per Nursing Assessment::   On Admission:  Self-harm thoughts  Demographic Factors:  Male  Loss Factors: NA  Historical Factors: NA  Risk Reduction Factors:   Positive coping skills or problem solving skills  Continued Clinical Symptoms:  Depression:   Severe  Cognitive Features That Contribute To Risk:  None    Suicide Risk:  Minimal: No identifiable suicidal ideation.  Patients presenting with no risk factors but with morbid ruminations; may be classified as minimal risk based on the severity of the depressive symptoms  Follow-up Information    [facility] Go in 3 day(s).   Specialty:  Professional Counselor Why:  Please attend a walk in appt between 8:30am-12noon  or between 1:00pm-2:30pm.  Please request therapy and medication management services. Contact information: [facility] [place] [address] [place] [address] [phone]           Plan Of Care/Follow-up recommendations:  Activity:  full  Temple Ewart, MD 09/04/2018, 10:29 AM

## 2018-09-04 NOTE — Tx Team (Signed)
Interdisciplinary Treatment and Diagnostic Plan Update  09/04/2018 Time of Session: 0948 Demetrias Goodbar MRN: [id number]  Principal Diagnosis: Severe recurrent major depression without psychotic features [facility])  Secondary Diagnoses: Active Problems:   * No active hospital problems. *   Current Medications:  Current Facility-Administered Medications  Medication Dose Route Frequency Provider Last Rate Last Dose  . acetaminophen (TYLENOL) tablet 650 mg  650 mg Oral Q6H PRN Fransisca Kaufmann A, NP      . ARIPiprazole (ABILIFY) tablet 15 mg  15 mg Oral Daily Micheal Likens, MD   15 mg at 09/04/18 0830  . ARIPiprazole ER (ABILIFY MAINTENA) injection 400 mg  400 mg Intramuscular Q28 days Jolyne Loa T, MD   400 mg at 09/01/18 1415  . famotidine (PEPCID) tablet 20 mg  20 mg Oral Daily Fransisca Kaufmann A, NP   20 mg at 09/04/18 0849  . FLUoxetine (PROZAC) capsule 20 mg  20 mg Oral Daily Micheal Likens, MD   20 mg at 09/04/18 0830  . hydrOXYzine (ATARAX/VISTARIL) tablet 50 mg  50 mg Oral Q6H PRN Micheal Likens, MD   50 mg at 09/01/18 2140  . LORazepam (ATIVAN) tablet 1 mg  1 mg Oral Q6H PRN Micheal Likens, MD       Or  . LORazepam (ATIVAN) injection 1 mg  1 mg Intramuscular Q6H PRN Micheal Likens, MD      . MUSCLE RUB CREA   Topical PRN Fransisca Kaufmann A, NP      . OLANZapine zydis (ZYPREXA) disintegrating tablet 5 mg  5 mg Oral Q8H PRN Micheal Likens, MD       Or  . ziprasidone (GEODON) injection 20 mg  20 mg Intramuscular Q12H PRN Micheal Likens, MD      . ondansetron [facility]) tablet 4 mg  4 mg Oral Q8H PRN Thermon Leyland, NP      . senna-docusate (Senokot-S) tablet 1 tablet  1 tablet Oral QHS PRN Thermon Leyland, NP      . traZODone (DESYREL) tablet 50 mg  50 mg Oral QHS,MR X 1 Kerry Hough, [name]   50 mg at 09/03/18 2101   PTA Medications: Medications Prior to Admission  Medication Sig Dispense Refill Last Dose  .  amLODipine (NORVASC) 5 MG tablet Take 1 tablet (5 mg total) by mouth daily. For high blood pressure. Restart if needed 10 tablet 0   . FLUoxetine (PROZAC) 10 MG capsule Take 1 capsule (10 mg total) by mouth daily. 30 capsule 0   . hydrOXYzine (ATARAX/VISTARIL) 25 MG tablet Take 1 tablet (25 mg total) by mouth at bedtime. 30 tablet 0     Patient Stressors: Health problems Marital or family conflict  Patient Strengths: Average or above average intelligence Supportive family/friends  Treatment Modalities: Medication Management, Group therapy, Case management,  1 to 1 session with clinician, Psychoeducation, Recreational therapy.   Physician Treatment Plan for Primary Diagnosis: Severe recurrent major depression without psychotic features (HCC) Long Term Goal(s): Improvement in symptoms so as ready for discharge Improvement in symptoms so as ready for discharge   Short Term Goals: Ability to identify and develop effective coping behaviors will improve Ability to demonstrate self-control will improve  Medication Management: Evaluate patient's response, side effects, and tolerance of medication regimen.  Therapeutic Interventions: 1 to 1 sessions, Unit Group sessions and Medication administration.  Evaluation of Outcomes: Adequate for Discharge  Physician Treatment Plan for Secondary Diagnosis: Active Problems:   * No  active hospital problems. *  Long Term Goal(s): Improvement in symptoms so as ready for discharge Improvement in symptoms so as ready for discharge   Short Term Goals: Ability to identify and develop effective coping behaviors will improve Ability to demonstrate self-control will improve     Medication Management: Evaluate patient's response, side effects, and tolerance of medication regimen.  Therapeutic Interventions: 1 to 1 sessions, Unit Group sessions and Medication administration.  Evaluation of Outcomes: Adequate for Discharge   RN Treatment Plan for Primary  Diagnosis: Severe recurrent major depression without psychotic features (HCC) Long Term Goal(s): Knowledge of disease and therapeutic regimen to maintain health will improve  Short Term Goals: Ability to identify and develop effective coping behaviors will improve and Compliance with prescribed medications will improve  Medication Management: RN will administer medications as ordered by provider, will assess and evaluate patient's response and provide education to patient for prescribed medication. RN will report any adverse and/or side effects to prescribing provider.  Therapeutic Interventions: 1 on 1 counseling sessions, Psychoeducation, Medication administration, Evaluate responses to treatment, Monitor vital signs and CBGs as ordered, Perform/monitor CIWA, COWS, AIMS and Fall Risk screenings as ordered, Perform wound care treatments as ordered.  Evaluation of Outcomes: Adequate for Discharge   LCSW Treatment Plan for Primary Diagnosis: Severe recurrent major depression without psychotic features (HCC) Long Term Goal(s): Safe transition to appropriate next level of care at discharge, Engage patient in therapeutic group addressing interpersonal concerns.  Short Term Goals: Engage patient in aftercare planning with referrals and resources, Increase social support and Increase skills for wellness and recovery  Therapeutic Interventions: Assess for all discharge needs, 1 to 1 time with Social worker, Explore available resources and support systems, Assess for adequacy in community support network, Educate family and significant other(s) on suicide prevention, Complete Psychosocial Assessment, Interpersonal group therapy.  Evaluation of Outcomes: Adequate for Discharge   Progress in Treatment: Attending groups: Yes. Participating in groups: No. Taking medication as prescribed: Yes. Toleration medication: Yes. Family/Significant other contact made: No, will contact:  grandmother Patient  understands diagnosis: Yes. Discussing patient identified problems/goals with staff: Yes. Medical problems stabilized or resolved: Yes. Denies suicidal/homicidal ideation: Yes. Issues/concerns per patient self-inventory: No. Other: none  New problem(s) identified: No, Describe:  none  New Short Term/Long Term Goal(s):  Patient Goals:  "find out how to make my problems better"  Discharge Plan or Barriers: Patient is discharging home and plans to [facility].   Reason for Continuation of Hospitalization: None   Estimated Length of Stay: Discharging, 09/04/18 Attendees: Patient:Darian Claudine Mouton 08/30/2018   Physician: Dr Altamese [place], MD; Dr. Malvin Johns  08/30/2018   Nursing: Dossie Arbour, RN; Lanora Manis.Val Eagle, RN 08/30/2018   RN Care Manager: 08/30/2018   Social Worker: Daleen Squibb, LCSW; Baldo Daub, LCSWA 08/30/2018   Recreational Therapist:  08/30/2018   Other:  08/30/2018   Other:  08/30/2018   Other: 08/30/2018        Scribe for Treatment Team: [name], LCSWA 09/04/2018 10:28 AM

## 2018-09-04 NOTE — Progress Notes (Signed)
Recreation Therapy Notes  Date: 11.4.19 Time: 1000 Location: [address] Dayroom  Group Topic: Coping Skills  Goal Area(s) Addresses:  Patient will be able to identify positive coping skills. Patient will identify benefits of using coping skills post d/c.  Behavioral Response: Engaged  Intervention: Worksheet  Activity: Mind map.  LRT and patients filled in the first 8 boxes of the mind map together (anxiety, depression, exhaustion, mood, frustration, stress, work and anger).  Individually, patients would come up with 3 coping skills for each situation.  The group would comeback together and LRT would fill in the coping skills on the board.  Education: Pharmacologist, Building control surveyor.   Education Outcome: Acknowledges understanding/In group clarification offered/Needs additional education.   Clinical Observations/Feedback: Pt was appropriate during group.  Pt engaged when prompted.  Pt stated some of his coping skills were hitting a punching bag, use a stress ball and avoid altercations.    Caroll Rancher, LRT/CTRS     Lillia Abed, Oday Ridings A 09/04/2018 11:10 AM

## 2018-09-04 NOTE — [facility] Suicide Risk Assessment (Signed)
[facility] Discharge Suicide Risk Assessment   Principal Problem: Severe recurrent major depression without psychotic features [facility]) Discharge Diagnoses: There are no active problems to display for this patient.   Total Time spent with patient: 30 minutes  Musculoskeletal: Strength & Muscle Tone: within normal limits Gait & Station: nl Patient leans: N/A  Psychiatric Specialty Exam: ROS  Blood pressure (!) 122/94, pulse 76, temperature 98.9 F (37.2 C), temperature source Oral, resp. rate 20, height 5\' 10"  (1.778 m), weight 57.2 kg, SpO2 100 %.Body mass index is 18.08 kg/m.  General Appearance: Casual  Eye Contact::  Good  Speech:  Normal Rate409  Volume:  Normal  Mood:  Euthymic  Affect:  Congruent  Thought Process:  Coherent  Orientation:  full  Thought Content:  Logical  Suicidal Thoughts:  No  Homicidal Thoughts:  No  Memory:  Recent;   Good  Judgement:  Good  Insight:  Good  Psychomotor Activity:  Normal  Concentration:  Good  Recall:  Good  Fund of Knowledge:Good  Language: Good  Akathisia:  Negative  Handed:  Right  AIMS (if indicated):     Assets:  Communication Skills  Sleep:  Number of Hours: 6.75  Cognition: WNL  ADL's:  Intact   Mental Status Per Nursing Assessment::   On Admission:  Self-harm thoughts  Demographic Factors:  Male  Loss Factors: death of relative  Historical Factors: NA  Risk Reduction Factors:   NA  Continued Clinical Symptoms:  stable  Cognitive Features That Contribute To Risk:  None    Suicide Risk:  Minimal: No identifiable suicidal ideation.  Patients presenting with no risk factors but with morbid ruminations; may be classified as minimal risk based on the severity of the depressive symptoms  Follow-up Information    [facility] Go in 3 day(s).   Specialty:  Professional Counselor Why:  Please attend a walk in appt between 8:30am-12noon or between 1:00pm-2:30pm.  Please request therapy and  medication management services. Contact information: [facility] [place] [address] [place] [address] [phone]           Plan Of Care/Follow-up recommendations:  Activity:  nl  Malvin Johns, MD 09/04/2018, 9:28 AM

## 2018-09-04 NOTE — Progress Notes (Signed)
Patient discharged to lobby. Patient was stable and appreciative at that time. All papers and prescriptions were given and valuables returned. Verbal understanding expressed. Denies SI/HI and A/VH. Patient given opportunity to express concerns and ask questions.  

## 2018-09-04 NOTE — Progress Notes (Signed)
  [facility] Adult Case Management Discharge Plan :  Will you be returning to the same living situation after discharge:  Yes,  home At discharge, do you have transportation home?: Yes,  family member Do you have the ability to pay for your medications: Yes,  BCBS insurance  Release of information consent forms completed and submitted to medical records by CSW.   Patient to Follow up at: Follow-up Information    [facility] Go in 3 day(s).   Specialty:  Professional Counselor Why:  Please attend a walk in appt between 8:30am-12noon or between 1:00pm-2:30pm.  Please request therapy and medication management services. Contact information: [facility] [place] [address] [place] [address] [phone]           Next level of care provider has access to [facility] Link:no  Safety Planning and Suicide Prevention discussed: Yes,  SPE completed with pt and his grandmother. SPI pamphlet and mobile crisis information provided to pt.   Have you used any form of tobacco in the last 30 days? (Cigarettes, Smokeless Tobacco, Cigars, and/or Pipes): No  Has patient been referred to the Quitline?: N/A patient is not a smoker  Patient has been referred for addiction treatment: Yes  Rona Ravens, LCSW 09/04/2018, 11:23 AM

## 2018-10-02 ENCOUNTER — Other Ambulatory Visit: Payer: Self-pay | Admitting: Internal Medicine

## 2018-11-01 DIAGNOSIS — E119 Type 2 diabetes mellitus without complications: Secondary | ICD-10-CM

## 2018-11-01 DIAGNOSIS — E109 Type 1 diabetes mellitus without complications: Secondary | ICD-10-CM

## 2018-11-01 HISTORY — DX: Type 1 diabetes mellitus without complications: E10.9

## 2018-11-01 HISTORY — DX: Type 2 diabetes mellitus without complications: E11.9

## 2018-11-06 ENCOUNTER — Other Ambulatory Visit: Payer: Self-pay

## 2018-11-06 ENCOUNTER — Ambulatory Visit: Payer: BLUE CROSS/BLUE SHIELD | Admitting: Family Medicine

## 2018-11-06 ENCOUNTER — Ambulatory Visit (HOSPITAL_COMMUNITY)
Admission: EM | Admit: 2018-11-06 | Discharge: 2018-11-06 | Discharge status: Against Medical Advice | Payer: BLUE CROSS/BLUE SHIELD

## 2018-11-06 ENCOUNTER — Encounter: Payer: Self-pay | Admitting: Family Medicine

## 2018-11-06 VITALS — BP 135/79 | HR 83 | Temp 98.6°F | Resp 16 | Ht 70.0 in | Wt 133.2 lb

## 2018-11-06 DIAGNOSIS — Z87898 Personal history of other specified conditions: Secondary | ICD-10-CM | POA: Diagnosis not present

## 2018-11-06 DIAGNOSIS — R81 Glycosuria: Secondary | ICD-10-CM | POA: Diagnosis not present

## 2018-11-06 DIAGNOSIS — E1165 Type 2 diabetes mellitus with hyperglycemia: Secondary | ICD-10-CM | POA: Diagnosis not present

## 2018-11-06 DIAGNOSIS — R350 Frequency of micturition: Secondary | ICD-10-CM | POA: Diagnosis not present

## 2018-11-06 LAB — POCT URINALYSIS DIP (MANUAL ENTRY)
Bilirubin, UA: NEGATIVE
Blood, UA: NEGATIVE
Glucose, UA: 500 mg/dL — AB
Leukocytes, UA: NEGATIVE
Nitrite, UA: NEGATIVE
Protein Ur, POC: NEGATIVE mg/dL
Spec Grav, UA: 1.015 (ref 1.010–1.025)
Urobilinogen, UA: 0.2 E.U./dL
pH, UA: 5.5 (ref 5.0–8.0)

## 2018-11-06 LAB — BASIC METABOLIC PANEL
BUN/Creatinine Ratio: 17 (ref 9–20)
BUN: 17 mg/dL (ref 6–20)
CO2: 19 mmol/L — ABNORMAL LOW (ref 20–29)
Calcium: 10.2 mg/dL (ref 8.7–10.2)
Chloride: 89 mmol/L — ABNORMAL LOW (ref 96–106)
Creatinine, Ser: 1.02 mg/dL (ref 0.76–1.27)
GFR calc Af Amer: 117 mL/min/{1.73_m2} (ref >59)
GFR calc non Af Amer: 101 mL/min/{1.73_m2} (ref >59)
Glucose: 330 mg/dL — ABNORMAL HIGH (ref 65–99)
Potassium: 4.8 mmol/L (ref 3.5–5.2)
Sodium: 131 mmol/L — ABNORMAL LOW (ref 134–144)

## 2018-11-06 LAB — POC MICROSCOPIC URINALYSIS (UMFC): Mucus: ABSENT

## 2018-11-06 LAB — GLUCOSE, POCT (MANUAL RESULT ENTRY): POC Glucose: 351 mg/dl — AB (ref 70–99)

## 2018-11-06 MED ORDER — BASAGLAR KWIKPEN 100 UNIT/ML ~~LOC~~ SOPN: 10.0000 [IU] | PEN_INJECTOR | Freq: Every day | SUBCUTANEOUS | 0 refills | Status: DC

## 2018-11-06 NOTE — Progress Notes (Signed)
Subjective:    Patient ID: Jacob [name], male    DOB: [birth year], 28 y.o.   MRN: [id number]  HPI Jacob [name] is a 28 y.o. male Presents today for: Chief Complaint  Patient presents with  . Urinary Tract Infection    urinary frequancy x 1wk, Just found out diabetic has been put on metformin but they think something else is going on. Having too many accidents on himself   PCP: [facility].   Frequent urination, started last week.  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 Only occasional urinary incontinence when can't get there in time. No LE weakness, stool incontinence, no saddle anesthesia.  Having to urinate every to 1 hr.   Recently diagnosed with diabetes 4 days ago. Unknown A1c, but glucose too high to read.  [facility] started on metformin 500mg  BID. unknown if bloodwork sent out. Has been taking meds past 3 days.  Has noticed some gas today. Vomited 2-3 times this morning. None since. Thirsty with dry mouth. Slight blurry vision in the morning.  No abd pain.  No diarrhea.  A1c 6.1 in April by EHR.  There are no active problems to display for this patient.  No past medical history on file. No past surgical history on file. No Known Allergies Prior to Admission medications   Medication Sig Start Date End Date Taking? Authorizing Provider  ARIPiprazole ER (ABILIFY MAINTENA) 400 MG SRER injection Inject 2 mLs (400 mg total) into the muscle every 28 (twenty-eight) days. (Due on 09-29-18): For mood control 09/29/18  Yes Nwoko, Nicole Kindred I, NP  FLUoxetine (PROZAC) 20 MG capsule Take 1 capsule (20 mg total) by mouth daily. For depression 09/05/18  Yes Sanjuana Kava, NP   Social History   Socioeconomic History  . Marital status: Single    Spouse name: Not on file  . Number of children: Not on file  . Years of education: Not on file  . Highest education level: Not on file  Occupational History  . Not on file  Social Needs  . Financial resource strain: Not on file    . Food insecurity:    Worry: Not on file    Inability: Not on file  . Transportation needs:    Medical: Not on file    Non-medical: Not on file  Tobacco Use  . Smoking status: Never Smoker  . Smokeless tobacco: Never Used  Substance and Sexual Activity  . Alcohol use: No  . Drug use: Not Currently  . Sexual activity: Not on file  Lifestyle  . Physical activity:    Days per week: Not on file    Minutes per session: Not on file  . Stress: Not on file  Relationships  . Social connections:    Talks on phone: Not on file    Gets together: Not on file    Attends religious service: Not on file    Active member of club or organization: Not on file    Attends meetings of clubs or organizations: Not on file    Relationship status: Not on file  . Intimate partner violence:    Fear of current or ex partner: Not on file    Emotionally abused: Not on file    Physically abused: Not on file    Forced sexual activity: Not on file  Other Topics Concern  . Not on file  Social History Narrative  . Not on file    Review of Systems  Objective:   Physical Exam Vitals signs reviewed.  Constitutional:      General: He is not in acute distress.    Appearance: He is well-developed. He is not ill-appearing, toxic-appearing or diaphoretic.  HENT:     Head: Normocephalic and atraumatic.  Eyes:     Pupils: Pupils are equal, round, and reactive to light.  Neck:     Vascular: No carotid bruit or JVD.  Cardiovascular:     Rate and Rhythm: Normal rate and regular rhythm.     Heart sounds: Normal heart sounds. No murmur.  Pulmonary:     Effort: Pulmonary effort is normal.     Breath sounds: Normal breath sounds. No rales.     Comments: Normal effort, no distress.  Abdominal:     General: Bowel sounds are normal.     Tenderness: There is no abdominal tenderness.  Skin:    General: Skin is warm and dry.  Neurological:     Mental Status: He is alert and oriented to person, place, and  time.  Psychiatric:        Mood and Affect: Mood normal.    Vitals:   11/06/18 1432  BP: 135/79  Pulse: 83  Resp: 16  Temp: 98.6 F (37 C)  TempSrc: Oral  SpO2: 97%  Weight: 133 lb 3.2 oz (60.4 kg)  Height: 5\' 10"  (1.778 m)     Results for orders placed or performed in visit on 11/06/18  Basic metabolic panel  Result Value Ref Range   Glucose 330 (H) 65 - 99 mg/dL   BUN 17 6 - 20 mg/dL   Creatinine, Ser 4.94 0.76 - 1.27 mg/dL   GFR calc non Af Amer 101 >59 mL/min/1.73   GFR calc Af Amer 117 >59 mL/min/1.73   BUN/Creatinine Ratio 17 9 - 20   Sodium 131 (L) 134 - 144 mmol/L   Potassium 4.8 3.5 - 5.2 mmol/L   Chloride 89 (L) 96 - 106 mmol/L   CO2 19 (L) 20 - 29 mmol/L   Calcium 10.2 8.7 - 10.2 mg/dL  POCT urinalysis dipstick  Result Value Ref Range   Color, UA yellow yellow   Clarity, UA clear clear   Glucose, UA =500 (A) negative mg/dL   Bilirubin, UA negative negative   Ketones, POC UA moderate (40) (A) negative mg/dL   Spec Grav, UA 4.967 5.916 - 1.025   Blood, UA negative negative   pH, UA 5.5 5.0 - 8.0   Protein Ur, POC negative negative mg/dL   Urobilinogen, UA 0.2 0.2 or 1.0 E.U./dL   Nitrite, UA Negative Negative   Leukocytes, UA Negative Negative  POCT Microscopic Urinalysis ([facility])  Result Value Ref Range   WBC,UR,HPF,POC None None WBC/hpf   RBC,UR,HPF,POC None None RBC/hpf   Bacteria None None, Too numerous to count   Mucus Absent Absent   Epithelial Cells, UR Per Microscopy None None, Too numerous to count cells/hpf  POCT glucose (manual entry)  Result Value Ref Range   POC Glucose 351 (A) 70 - 99 mg/dl      Assessment & Plan:    Jacob [name] is a 28 y.o. male Urine frequency - Plan: POCT urinalysis dipstick, POCT Microscopic Urinalysis ([facility]), Basic metabolic panel, Insulin Glargine (BASAGLAR KWIKPEN) 100 UNIT/ML SOPN  Glycosuria - Plan: Basic metabolic panel, POCT glucose (manual entry)  History of vomiting - Plan: Basic metabolic  panel  Type 2 diabetes mellitus with hyperglycemia, without long-term current use of insulin (HCC) - Plan: Insulin  Glargine (BASAGLAR KWIKPEN) 100 UNIT/ML SOPN  Recent diagnosis of diabetes, started on metformin few days ago.  Unknown initial A1c.  Recent urinary frequency/episodic urinary incontinence likely due to urge incontinence and difficulty making it to the restroom with frequency due to hyperglycemia.  No concerning findings on history or exam regarding incontinence, and no sign of infection on urinalysis.  Did have some urinary ketones, and he did report episode of vomiting earlier in the morning but had been feeling fine since, nontoxic appearance in office and tolerating p.o. intake.  Less likely DKA.  -Basaglar 10 units/day started, continue metformin for now, recommended follow up with his PCP in next few days.   -Stat BMP obtained.  Borderline CO2 at 19, but with improved symptoms decided to recheck next day.    -Called patient at home phone number, spoke to his grandmother.  He was not available.  Tried to contact at [employer] and unable to leave a message.  Left message back on initial home phone number for him to call office.  I would like him to return for lab only visit for repeat BMP today to recheck bicarb.  If any nausea, vomiting or worsening symptoms should proceed to the emergency room instead.  -   Meds ordered this encounter  Medications  . Insulin Glargine (BASAGLAR KWIKPEN) 100 UNIT/ML SOPN    Sig: Inject 0.1 mLs (10 Units total) into the skin daily.    Dispense:  3 mL    Refill:  0   Patient Instructions    If the stat lab work tonight looks okay (we will let you know) go ahead start insulin 10 units once per day and follow up in next few days with your primary care provider.  Okay to continue metformin for now.   If any return of nausea or vomiting, any abdominal pain, or any worsening symptoms proceed directly to the emergency room.   Insulin Injection  Instructions, Using Insulin Pens, Adult A subcutaneous injection is a shot of medicine that is injected into the layer of fat and tissue between skin and muscle. People with type 1 diabetes must take insulin because their bodies do not make it. People with type 2 diabetes may need to take insulin. There are many different types of insulin. The type of insulin that you take may determine how many injections you give yourself and when you need to give the injections. Supplies needed:  Soap and water to wash hands.  Your insulin pen.  A new, unused needle.  Alcohol wipes.  A disposal container that is meant for sharp items (sharps container), such as an empty plastic bottle with a cover. How to choose a site for injection The body absorbs insulin differently, depending on where the insulin is injected (injection site). It is best to inject insulin into the same body area each time (for example, always in the abdomen), but you should use a different spot in that area for each injection. Do not inject the insulin in the same spot each time. There are five main areas that can be used for injecting. These areas include:  Abdomen. This is the preferred area.  Front of thigh.  Upper, outer side of thigh.  Upper, outer side of arm.  Upper, outer part of buttock. How to use an insulin pen  First, follow the steps for Get ready, then continue with the steps for Inject the insulin. Get ready 1. Wash your hands with soap and water. If soap and  water are not available, use hand sanitizer. 2. Before you give yourself an insulin injection, be sure to test your blood sugar level (blood glucose level) and write down that number. Follow any instructions from your health care provider about what to do if your blood glucose level is higher or lower than your normal range. 3. Check the expiration date and the type of insulin that is in the pen. 4. If you are using CLEAR insulin, check to see that it is  clear and free of clumps. 5. If you are using CLOUDY insulin, do not shake the pen to get the injection ready. Instead, get it ready in one of these ways: ? Gently roll the pen between your palms several times. ? Tip the pen up and down several times. 6. Remove the cap from the insulin pen. 7. Use an alcohol wipe to clean the rubber tip of the pen. 8. Remove the protective paper tab from the disposable needle. Do not let the needle touch anything. 9. Screw a new, unused needle onto the pen. 10. Remove the outer plastic needle cover. Do not throw away the outer plastic cover yet. ? If the pen uses a special safety needle, leave the inner needle shield in place. ? If the pen does not use a special safety needle, remove the inner plastic cover from the needle. 11. Follow the manufacturer's instructions to prime the insulin pen with the volume of insulin needed. Hold the pen with the needle pointing up, and push the button on the opposite end of the pen until a drop of insulin appears at the needle tip. If no insulin appears, repeat this step. 12. Turn the button (dial) to the number of units of insulin that you will be injecting. Inject the insulin 1. Use an alcohol wipe to clean the site where you will be injecting the needle. Let the site air-dry. 2. Hold the pen in the palm of your writing hand like a pencil. 3. If directed by your health care provider, use your other hand to pinch and hold about an inch (2.5 cm) of skin at the injection site. Do not directly touch the cleaned part of the skin. 4. Gently but quickly, use your writing hand to put the needle straight into the skin. The needle should be at a 90-degree angle (perpendicular) to the skin. 5. When the needle is completely inserted into the skin, use your thumb or index finger of your writing hand to push the top button of the pen down all the way to inject the insulin. 6. Let go of the skin that you are pinching. Continue to hold the pen  in place with your writing hand. 7. Wait 10 seconds, then pull the needle straight out of the skin. This will allow all of the insulin to go from the pen and needle into your body. 8. Carefully put the larger (outer) plastic cover of the needle back over the needle, then unscrew the capped needle and discard it in a sharps container, such as an empty plastic bottle with a cover. 9. Put the plastic cap back on the insulin pen. How to throw away supplies  Discard all used needles in a puncture-proof sharps disposal container. You can ask your local pharmacy about where you can get this kind of disposal container, or you can use an empty plastic liquid laundry detergent bottle that has a cover.  Follow the disposal regulations for the area where you live. Do not use any  needle more than one time.  Throw away empty disposable pens in the regular trash. Questions to ask your health care provider  How often should I be taking insulin?  How often should I check my blood glucose?  What amount of insulin should I be taking at each time?  What are the side effects?  What should I do if my blood glucose is too high?  What should I do if my blood glucose is too low?  What should I do if I forget to take my insulin?  What number should I call if I have questions? Where to find more information  American Diabetes Association (ADA): www.diabetes.org  American Association of Diabetes Educators (AADE) Patient Resources: https://www.diabeteseducator.org Summary  A subcutaneous injection is a shot of medicine that is injected into the layer of fat and tissue between skin and muscle.  Before you give yourself an insulin injection, be sure to test your blood sugar level (blood glucose level) and write down that number.  Check the expiration date and the type of insulin that is in the pen. The type of insulin that you take may determine how many injections you give yourself and when you need to give  the injections.  It is best to inject insulin into the same body area each time (for example, always in the abdomen), but you should use a different spot in that area for each injection. This information is not intended to replace advice given to you by your health care provider. Make sure you discuss any questions you have with your health care provider. Document Released: 11/21/2015 Document Revised: 11/07/2017 Document Reviewed: 11/21/2015 Elsevier Interactive Patient Education  [employer]2019 [employer] Inc.      If you have lab work done today you will be contacted with your lab results within the next 2 weeks.  If you have not heard from us then please contact us. The fastest way to get your results is to register for My Chart.   IF you received an x-ray today, you will receive an invoice from [facility]. Please contact [facility] at [phone] with questions or concerns regarding your invoice.   IF you received labwork today, you will receive an invoice from [facility]. Please contact [facility] at [phone] with questions or concerns regarding your invoice.   Our billing staff will not be able to assist you with questions regarding bills from these companies.  You will be contacted with the lab results as soon as they are available. The fastest way to get your results is to activate your My Chart account. Instructions are located on the last page of this paperwork. If you have not heard from us regarding the results in 2 weeks, please contact this office.       Signed,   Meredith StaggersJeffrey Karishma Unrein, MD Primary Care at [facility].  11/07/18 12:11 PM

## 2018-11-06 NOTE — Patient Instructions (Addendum)
If the stat lab work tonight looks okay (we will let you know) go ahead start insulin 10 units once per day and follow up in next few days with your primary care provider.  Okay to continue metformin for now.   If any return of nausea or vomiting, any abdominal pain, or any worsening symptoms proceed directly to the emergency room.   Insulin Injection Instructions, Using Insulin Pens, Adult A subcutaneous injection is a shot of medicine that is injected into the layer of fat and tissue between skin and muscle. People with type 1 diabetes must take insulin because their bodies do not make it. People with type 2 diabetes may need to take insulin. There are many different types of insulin. The type of insulin that you take may determine how many injections you give yourself and when you need to give the injections. Supplies needed:  Soap and water to wash hands.  Your insulin pen.  A new, unused needle.  Alcohol wipes.  A disposal container that is meant for sharp items (sharps container), such as an empty plastic bottle with a cover. How to choose a site for injection The body absorbs insulin differently, depending on where the insulin is injected (injection site). It is best to inject insulin into the same body area each time (for example, always in the abdomen), but you should use a different spot in that area for each injection. Do not inject the insulin in the same spot each time. There are five main areas that can be used for injecting. These areas include:  Abdomen. This is the preferred area.  Front of thigh.  Upper, outer side of thigh.  Upper, outer side of arm.  Upper, outer part of buttock. How to use an insulin pen  First, follow the steps for Get ready, then continue with the steps for Inject the insulin. Get ready 1. Wash your hands with soap and water. If soap and water are not available, use hand sanitizer. 2. Before you give yourself an insulin injection, be sure  to test your blood sugar level (blood glucose level) and write down that number. Follow any instructions from your health care provider about what to do if your blood glucose level is higher or lower than your normal range. 3. Check the expiration date and the type of insulin that is in the pen. 4. If you are using CLEAR insulin, check to see that it is clear and free of clumps. 5. If you are using CLOUDY insulin, do not shake the pen to get the injection ready. Instead, get it ready in one of these ways: ? Gently roll the pen between your palms several times. ? Tip the pen up and down several times. 6. Remove the cap from the insulin pen. 7. Use an alcohol wipe to clean the rubber tip of the pen. 8. Remove the protective paper tab from the disposable needle. Do not let the needle touch anything. 9. Screw a new, unused needle onto the pen. 10. Remove the outer plastic needle cover. Do not throw away the outer plastic cover yet. ? If the pen uses a special safety needle, leave the inner needle shield in place. ? If the pen does not use a special safety needle, remove the inner plastic cover from the needle. 11. Follow the manufacturer's instructions to prime the insulin pen with the volume of insulin needed. Hold the pen with the needle pointing up, and push the button on the opposite end of  the pen until a drop of insulin appears at the needle tip. If no insulin appears, repeat this step. 12. Turn the button (dial) to the number of units of insulin that you will be injecting. Inject the insulin 1. Use an alcohol wipe to clean the site where you will be injecting the needle. Let the site air-dry. 2. Hold the pen in the palm of your writing hand like a pencil. 3. If directed by your health care provider, use your other hand to pinch and hold about an inch (2.5 cm) of skin at the injection site. Do not directly touch the cleaned part of the skin. 4. Gently but quickly, use your writing hand to put  the needle straight into the skin. The needle should be at a 90-degree angle (perpendicular) to the skin. 5. When the needle is completely inserted into the skin, use your thumb or index finger of your writing hand to push the top button of the pen down all the way to inject the insulin. 6. Let go of the skin that you are pinching. Continue to hold the pen in place with your writing hand. 7. Wait 10 seconds, then pull the needle straight out of the skin. This will allow all of the insulin to go from the pen and needle into your body. 8. Carefully put the larger (outer) plastic cover of the needle back over the needle, then unscrew the capped needle and discard it in a sharps container, such as an empty plastic bottle with a cover. 9. Put the plastic cap back on the insulin pen. How to throw away supplies  Discard all used needles in a puncture-proof sharps disposal container. You can ask your local pharmacy about where you can get this kind of disposal container, or you can use an empty plastic liquid laundry detergent bottle that has a cover.  Follow the disposal regulations for the area where you live. Do not use any needle more than one time.  Throw away empty disposable pens in the regular trash. Questions to ask your health care provider  How often should I be taking insulin?  How often should I check my blood glucose?  What amount of insulin should I be taking at each time?  What are the side effects?  What should I do if my blood glucose is too high?  What should I do if my blood glucose is too low?  What should I do if I forget to take my insulin?  What number should I call if I have questions? Where to find more information  American Diabetes Association (ADA): www.diabetes.org  American Association of Diabetes Educators (AADE) Patient Resources: https://www.diabeteseducator.org Summary  A subcutaneous injection is a shot of medicine that is injected into the layer of  fat and tissue between skin and muscle.  Before you give yourself an insulin injection, be sure to test your blood sugar level (blood glucose level) and write down that number.  Check the expiration date and the type of insulin that is in the pen. The type of insulin that you take may determine how many injections you give yourself and when you need to give the injections.  It is best to inject insulin into the same body area each time (for example, always in the abdomen), but you should use a different spot in that area for each injection. This information is not intended to replace advice given to you by your health care provider. Make sure you discuss any questions  you have with your health care provider. Document Released: 11/21/2015 Document Revised: 11/07/2017 Document Reviewed: 11/21/2015 Elsevier Interactive Patient Education  [employer].      If you have lab work done today you will be contacted with your lab results within the next 2 weeks.  If you have not heard from [place] then please contact us. The fastest way to get your results is to register for My Chart.   IF you received an x-ray today, you will receive an invoice from [facility]. Please contact [facility] at [phone] with questions or concerns regarding your invoice.   IF you received labwork today, you will receive an invoice from [facility]. Please contact [facility] at [phone] with questions or concerns regarding your invoice.   Our billing staff will not be able to assist you with questions regarding bills from these companies.  You will be contacted with the lab results as soon as they are available. The fastest way to get your results is to activate your My Chart account. Instructions are located on the last page of this paperwork. If you have not heard from [place] regarding the results in 2 weeks, please contact this office.

## 2018-11-07 ENCOUNTER — Telehealth: Payer: Self-pay | Admitting: General Practice

## 2018-11-07 MED ORDER — PEN NEEDLES 32G X 4 MM MISC: 1.0000 "application " | Freq: Every day | 0 refills | Status: DC

## 2018-11-07 NOTE — Telephone Encounter (Signed)
Copied from CRM [id number]. Topic: Quick Communication - See Telephone Encounter >> Nov 07, 2018  8:52 AM Herby Abraham C wrote: CRM for notification. See Telephone encounter for: 11/07/18.  Pt was seen yesterday and prescribed Insulin Glargine (BASAGLAR KWIKPEN) 100 UNIT/ML SOPN, per the pharmacy pt need the pen needles also.   Pharmacy:[id number] [place], [place] - [address] - [address] [phone] (Phone) [phone] (Fax

## 2018-11-07 NOTE — Telephone Encounter (Signed)
Prescription already filled

## 2018-11-17 ENCOUNTER — Emergency Department (HOSPITAL_COMMUNITY)
Admission: EM | Admit: 2018-11-17 | Discharge: 2018-11-17 | Disposition: A | Discharge status: Home / Self Care | Payer: BLUE CROSS/BLUE SHIELD | Attending: Emergency Medicine | Admitting: Emergency Medicine

## 2018-11-17 ENCOUNTER — Encounter (HOSPITAL_COMMUNITY): Payer: Self-pay | Admitting: Emergency Medicine

## 2018-11-17 DIAGNOSIS — R358 Other polyuria: Secondary | ICD-10-CM | POA: Diagnosis present

## 2018-11-17 DIAGNOSIS — Z79899 Other long term (current) drug therapy: Secondary | ICD-10-CM | POA: Insufficient documentation

## 2018-11-17 DIAGNOSIS — Z794 Long term (current) use of insulin: Secondary | ICD-10-CM | POA: Diagnosis not present

## 2018-11-17 DIAGNOSIS — E1165 Type 2 diabetes mellitus with hyperglycemia: Secondary | ICD-10-CM | POA: Diagnosis not present

## 2018-11-17 DIAGNOSIS — R739 Hyperglycemia, unspecified: Secondary | ICD-10-CM

## 2018-11-17 HISTORY — DX: Type 2 diabetes mellitus without complications: E11.9

## 2018-11-17 LAB — CBC
HCT: 44.1 % (ref 39.0–52.0)
Hemoglobin: 14.4 g/dL (ref 13.0–17.0)
MCH: 28.8 pg (ref 26.0–34.0)
MCHC: 32.7 g/dL (ref 30.0–36.0)
MCV: 88.2 fL (ref 80.0–100.0)
Platelets: 372 10*3/uL (ref 150–400)
RBC: 5 MIL/uL (ref 4.22–5.81)
RDW: 12 % (ref 11.5–15.5)
WBC: 5.8 10*3/uL (ref 4.0–10.5)
nRBC: 0 % (ref 0.0–0.2)

## 2018-11-17 LAB — URINALYSIS, ROUTINE W REFLEX MICROSCOPIC
Bacteria, UA: NONE SEEN
Bilirubin Urine: NEGATIVE
Glucose, UA: >=500 mg/dL — AB
Hgb urine dipstick: NEGATIVE
Ketones, ur: NEGATIVE mg/dL
Leukocytes, UA: NEGATIVE
Nitrite: NEGATIVE
Protein, ur: NEGATIVE mg/dL
Specific Gravity, Urine: 1.033 — ABNORMAL HIGH (ref 1.005–1.030)
pH: 7 (ref 5.0–8.0)

## 2018-11-17 LAB — BASIC METABOLIC PANEL
Anion gap: 9 (ref 5–15)
BUN: 16 mg/dL (ref 6–20)
CO2: 29 mmol/L (ref 22–32)
Calcium: 10 mg/dL (ref 8.9–10.3)
Chloride: 96 mmol/L — ABNORMAL LOW (ref 98–111)
Creatinine, Ser: 1.07 mg/dL (ref 0.61–1.24)
GFR calc Af Amer: >60 mL/min (ref >60)
GFR calc non Af Amer: >60 mL/min (ref >60)
Glucose, Bld: 477 mg/dL — ABNORMAL HIGH (ref 70–99)
Potassium: 4.3 mmol/L (ref 3.5–5.1)
Sodium: 134 mmol/L — ABNORMAL LOW (ref 135–145)

## 2018-11-17 MED ORDER — INSULIN ASPART 100 UNIT/ML ~~LOC~~ SOLN
5.0000 [IU] | Freq: Once | SUBCUTANEOUS | Status: AC
  Administered 2018-11-17: 5 [IU] via SUBCUTANEOUS
  Filled 2018-11-17: qty 1

## 2018-11-17 NOTE — ED Provider Notes (Signed)
[facility]-EMERGENCY DEPT Provider Note   CSN: [id number] Arrival date & time: 11/17/18  1246     History   Chief Complaint Chief Complaint  Patient presents with  . Hyperglycemia    HPI Jacob [name] is a 28 y.o. male.  HPI   Jacob [name] is a 28 y.o. male, with a history of DM, presenting to the ED with polyuria for the past few days.  He states this is how he knew his blood sugar was high.  Last insulin was 20 units at 9:30 AM this morning. He states he is a newly diagnosed diabetic, first diagnosed last fall.  He admits that he has not changed his diet at all since diagnosis.  He has not increased his fluid intake.  Prior to coming to the ED he drank a large soda and ate fried chicken. Patient called his PCP today and was told to come to the ED due to his hyperglycemia. Denies fever/chills, recent illness, N/V/D, shortness of breath, chest pain, abdominal pain, edema, dysuria, hematuria, or any other complaints.   Past Medical History:  Diagnosis Date  . Diabetes mellitus without complication (HCC)     There are no active problems to display for this patient.   History reviewed. No pertinent surgical history.      Home Medications    Prior to Admission medications   Medication Sig Start Date End Date Taking? Authorizing Provider  ARIPiprazole ER (ABILIFY MAINTENA) 400 MG SRER injection Inject 2 mLs (400 mg total) into the muscle every 28 (twenty-eight) days. (Due on 09-29-18): For mood control 09/29/18  Yes Nwoko, Nicole KindredAgnes I, NP  Cyanocobalamin (VITAMIN B-12 PO) Take 1 tablet by mouth daily.   Yes [provider]  fenofibrate (TRICOR) 48 MG tablet Take 48 mg by mouth daily. 11/10/18  Yes [provider]  Insulin Glargine (BASAGLAR KWIKPEN) 100 UNIT/ML SOPN Inject 0.1 mLs (10 Units total) into the skin daily. Patient taking differently: Inject 20 Units into the skin daily.  11/06/18  Yes Shade FloodGreene, Jeffrey R, MD  metFORMIN (GLUCOPHAGE)  1000 MG tablet Take 1,000 mg by mouth 2 (two) times daily. 11/10/18  Yes [provider]  FLUoxetine (PROZAC) 20 MG capsule Take 1 capsule (20 mg total) by mouth daily. For depression Patient not taking: Reported on 11/17/2018 09/05/18   Armandina StammerNwoko, Agnes I, NP  Insulin Pen Needle (PEN NEEDLES) 32G X 4 MM MISC 1 application by Does not apply route daily. 11/07/18   Shade FloodGreene, Jeffrey R, MD    Family History No family history on file.  Social History Social History   Tobacco Use  . Smoking status: Never Smoker  . Smokeless tobacco: Never Used  Substance Use Topics  . Alcohol use: No  . Drug use: Not Currently     Allergies   Patient has no known allergies.   Review of Systems Review of Systems  Constitutional: Negative for chills, diaphoresis and fever.  HENT: Negative for congestion.   Respiratory: Negative for cough and shortness of breath.   Cardiovascular: Negative for chest pain and leg swelling.  Gastrointestinal: Negative for abdominal pain, diarrhea, nausea and vomiting.  Endocrine: Positive for polyuria.       Hyperglycemia  Genitourinary: Negative for dysuria, flank pain, hematuria, penile pain, scrotal swelling and testicular pain.  Musculoskeletal: Negative for back pain.  Neurological: Negative for dizziness, syncope, weakness, light-headedness, numbness and headaches.  Psychiatric/Behavioral: Negative for confusion.  All other systems reviewed and are negative.    Physical  Exam Updated Vital Signs BP (!) 122/97 (BP Location: Left Arm)   Pulse 72   Temp 98.5 F (36.9 C) (Oral)   Resp 18   SpO2 100%   Physical Exam Vitals signs and nursing note reviewed.  Constitutional:      General: He is not in acute distress.    Appearance: He is well-developed. He is not diaphoretic.  HENT:     Head: Normocephalic and atraumatic.     Mouth/Throat:     Mouth: Mucous membranes are moist.     Pharynx: Oropharynx is clear.  Eyes:     Conjunctiva/sclera:  Conjunctivae normal.  Neck:     Musculoskeletal: Neck supple.  Cardiovascular:     Rate and Rhythm: Normal rate and regular rhythm.     Pulses: Normal pulses.     Heart sounds: Normal heart sounds.  Pulmonary:     Effort: Pulmonary effort is normal. No respiratory distress.     Breath sounds: Normal breath sounds.  Abdominal:     Palpations: Abdomen is soft.     Tenderness: There is no abdominal tenderness. There is no guarding.  Musculoskeletal:     Right lower leg: No edema.     Left lower leg: No edema.  Lymphadenopathy:     Cervical: No cervical adenopathy.  Skin:    General: Skin is warm and dry.  Neurological:     Mental Status: He is alert and oriented to person, place, and time.  Psychiatric:        Mood and Affect: Mood and affect normal.        Speech: Speech normal.        Behavior: Behavior normal.      ED Treatments / Results  Labs (all labs ordered are listed, but only abnormal results are displayed) Labs Reviewed  BASIC METABOLIC PANEL - Abnormal; Notable for the following components:      Result Value   Sodium 134 (*)    Chloride 96 (*)    Glucose, Bld 477 (*)    All other components within normal limits  URINALYSIS, ROUTINE W REFLEX MICROSCOPIC - Abnormal; Notable for the following components:   Color, Urine STRAW (*)    Specific Gravity, Urine 1.033 (*)    Glucose, UA >=500 (*)    All other components within normal limits  CBC  CBG MONITORING, ED    EKG None  Radiology No results found.  Procedures Procedures (including critical care time)  Medications Ordered in ED Medications  insulin aspart (novoLOG) injection 5 Units (5 Units Subcutaneous Given 11/17/18 1632)     Initial Impression / Assessment and Plan / ED Course  I have reviewed the triage vital signs and the nursing notes.  Pertinent labs & imaging results that were available during my care of the patient were reviewed by me and considered in my medical decision making (see  chart for details).     Patient presents with hyperglycemia. Patient is nontoxic appearing, afebrile, not tachycardic, not tachypneic, not hypotensive, maintains excellent SPO2 on room air, and is in no apparent distress.  No vomiting.  No elevated anion gap. I discussed treatment options with the patient and shared decision-making was used.  We agreed upon a treatment plan that included administration of subcutaneous insulin and patient continuing to hydrate at home. The patient was given instructions for home care as well as return precautions. Patient voices understanding of these instructions, accepts the plan, and is comfortable with discharge.   Findings  and plan of care discussed with Virgina Norfolk, MD.   Vitals:   11/17/18 1309 11/17/18 1613  BP: (!) 122/97 124/88  Pulse: 72 69  Resp: 18 19  Temp: 98.5 F (36.9 C) 98.3 F (36.8 C)  TempSrc: Oral Oral  SpO2: 100% 98%     Final Clinical Impressions(s) / ED Diagnoses   Final diagnoses:  Hyperglycemia    ED Discharge Orders    None       Concepcion Living 11/17/18 1947    Virgina Norfolk, DO 11/18/18 0128

## 2018-11-17 NOTE — ED Triage Notes (Signed)
Pt reports that his blood sugar been running high for about a month. Reports been taking his insulin as prescribed.

## 2018-11-17 NOTE — Discharge Instructions (Addendum)
Drink at least 64 ounces of water daily.  Stay away from fatty foods, fried foods, candy, soda, fast food, junk food, etc. Follow-up with your primary care provider on this matter soon as possible.  Your medications may need to be adjusted.  You may also need to follow-up with an endocrinologist.

## 2018-11-17 NOTE — ED Notes (Signed)
ED Provider at bedside. 

## 2018-11-19 LAB — CBG MONITORING, ED: Glucose-Capillary: 380 mg/dL — ABNORMAL HIGH (ref 70–99)

## 2018-12-15 ENCOUNTER — Ambulatory Visit: Payer: Self-pay | Admitting: Dietician

## 2019-11-15 ENCOUNTER — Encounter (HOSPITAL_COMMUNITY): Payer: Self-pay | Admitting: Psychiatry

## 2019-11-15 ENCOUNTER — Inpatient Hospital Stay (HOSPITAL_COMMUNITY)
Admission: AD | Admit: 2019-11-15 | Discharge: 2019-11-22 | DRG: 885 | Disposition: A | Discharge status: Home / Self Care | Payer: Medicaid Other | Source: Intra-hospital | Attending: Psychiatry | Admitting: Psychiatry

## 2019-11-15 ENCOUNTER — Other Ambulatory Visit: Payer: Self-pay

## 2019-11-15 DIAGNOSIS — I10 Essential (primary) hypertension: Secondary | ICD-10-CM | POA: Diagnosis present

## 2019-11-15 DIAGNOSIS — Z20822 Contact with and (suspected) exposure to covid-19: Secondary | ICD-10-CM | POA: Diagnosis present

## 2019-11-15 DIAGNOSIS — E539 Vitamin B deficiency, unspecified: Secondary | ICD-10-CM | POA: Diagnosis present

## 2019-11-15 DIAGNOSIS — F1290 Cannabis use, unspecified, uncomplicated: Secondary | ICD-10-CM | POA: Diagnosis present

## 2019-11-15 DIAGNOSIS — Z72811 Adult antisocial behavior: Secondary | ICD-10-CM

## 2019-11-15 DIAGNOSIS — Z794 Long term (current) use of insulin: Secondary | ICD-10-CM | POA: Diagnosis not present

## 2019-11-15 DIAGNOSIS — R4587 Impulsiveness: Secondary | ICD-10-CM | POA: Diagnosis present

## 2019-11-15 DIAGNOSIS — Z5329 Procedure and treatment not carried out because of patient's decision for other reasons: Secondary | ICD-10-CM | POA: Diagnosis not present

## 2019-11-15 DIAGNOSIS — G4700 Insomnia, unspecified: Secondary | ICD-10-CM | POA: Diagnosis present

## 2019-11-15 DIAGNOSIS — Z9114 Patient's other noncompliance with medication regimen: Secondary | ICD-10-CM | POA: Diagnosis not present

## 2019-11-15 DIAGNOSIS — F333 Major depressive disorder, recurrent, severe with psychotic symptoms: Principal | ICD-10-CM

## 2019-11-15 DIAGNOSIS — E119 Type 2 diabetes mellitus without complications: Secondary | ICD-10-CM | POA: Diagnosis present

## 2019-11-15 DIAGNOSIS — Z79899 Other long term (current) drug therapy: Secondary | ICD-10-CM

## 2019-11-15 DIAGNOSIS — F419 Anxiety disorder, unspecified: Secondary | ICD-10-CM | POA: Diagnosis present

## 2019-11-15 LAB — RESPIRATORY PANEL BY RT PCR (FLU A&B, COVID)
Influenza A by PCR: NEGATIVE
Influenza B by PCR: NEGATIVE
SARS Coronavirus 2 by RT PCR: NEGATIVE

## 2019-11-15 LAB — GLUCOSE, CAPILLARY: Glucose-Capillary: 167 mg/dL — ABNORMAL HIGH (ref 70–99)

## 2019-11-15 NOTE — [facility] Counselor (Signed)
Per Hassie Bruce, RN pt has been accepted to [facility]. Attending physician: Dr. Jeannine Kitten. Pending negative COVID. Discussed with Joanie Coddington, RN.    Redmond Pulling, MS, [facility], [facility] Triage Specialist [phone]
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
## 2019-11-16 LAB — COMPREHENSIVE METABOLIC PANEL
ALT: 21 U/L (ref 0–44)
AST: 29 U/L (ref 15–41)
Albumin: 4.4 g/dL (ref 3.5–5.0)
Alkaline Phosphatase: 63 U/L (ref 38–126)
Anion gap: 10 (ref 5–15)
BUN: 14 mg/dL (ref 6–20)
CO2: 28 mmol/L (ref 22–32)
Calcium: 9.6 mg/dL (ref 8.9–10.3)
Chloride: 100 mmol/L (ref 98–111)
Creatinine, Ser: 0.94 mg/dL (ref 0.61–1.24)
GFR calc Af Amer: >60 mL/min (ref >60)
GFR calc non Af Amer: >60 mL/min (ref >60)
Glucose, Bld: 136 mg/dL — ABNORMAL HIGH (ref 70–99)
Potassium: 4.5 mmol/L (ref 3.5–5.1)
Sodium: 138 mmol/L (ref 135–145)
Total Bilirubin: 1.3 mg/dL — ABNORMAL HIGH (ref 0.3–1.2)
Total Protein: 7.6 g/dL (ref 6.5–8.1)

## 2019-11-16 LAB — URINALYSIS, COMPLETE (UACMP) WITH MICROSCOPIC
Bilirubin Urine: NEGATIVE
Glucose, UA: NEGATIVE mg/dL
Hgb urine dipstick: NEGATIVE
Ketones, ur: 5 mg/dL — AB
Leukocytes,Ua: NEGATIVE
Nitrite: NEGATIVE
Protein, ur: NEGATIVE mg/dL
Specific Gravity, Urine: 1.027 (ref 1.005–1.030)
pH: 6 (ref 5.0–8.0)

## 2019-11-16 LAB — HEPATIC FUNCTION PANEL
ALT: 23 U/L (ref 0–44)
AST: 28 U/L (ref 15–41)
Albumin: 4.6 g/dL (ref 3.5–5.0)
Alkaline Phosphatase: 61 U/L (ref 38–126)
Bilirubin, Direct: 0.2 mg/dL (ref 0.0–0.2)
Indirect Bilirubin: 1.3 mg/dL — ABNORMAL HIGH (ref 0.3–0.9)
Total Bilirubin: 1.5 mg/dL — ABNORMAL HIGH (ref 0.3–1.2)
Total Protein: 7.7 g/dL (ref 6.5–8.1)

## 2019-11-16 LAB — RAPID URINE DRUG SCREEN, HOSP PERFORMED
Amphetamines: NOT DETECTED
Barbiturates: NOT DETECTED
Benzodiazepines: NOT DETECTED
Cocaine: NOT DETECTED
Opiates: NOT DETECTED
Tetrahydrocannabinol: NOT DETECTED

## 2019-11-16 LAB — HEMOGLOBIN A1C
Hgb A1c MFr Bld: 9.4 % — ABNORMAL HIGH (ref 4.8–5.6)
Mean Plasma Glucose: 223.08 mg/dL

## 2019-11-16 LAB — LIPID PANEL
Cholesterol: 178 mg/dL (ref 0–200)
HDL: 79 mg/dL (ref >40)
LDL Cholesterol: 91 mg/dL (ref 0–99)
Total CHOL/HDL Ratio: 2.3 RATIO
Triglycerides: 41 mg/dL (ref <150)
VLDL: 8 mg/dL (ref 0–40)

## 2019-11-16 LAB — ETHANOL: Alcohol, Ethyl (B): <10 mg/dL (ref <10)

## 2019-11-16 LAB — TSH: TSH: 1.568 u[IU]/mL (ref 0.350–4.500)

## 2019-11-16 LAB — MAGNESIUM: Magnesium: 2.3 mg/dL (ref 1.7–2.4)

## 2019-11-16 LAB — GLUCOSE, CAPILLARY: Glucose-Capillary: 135 mg/dL — ABNORMAL HIGH (ref 70–99)

## 2019-11-16 MED ORDER — HYDROXYZINE HCL 25 MG PO TABS
25.0000 mg | ORAL_TABLET | Freq: Three times a day (TID) | ORAL | Status: DC | PRN
  Administered 2019-11-21: 25 mg via ORAL
  Filled 2019-11-16 (×3): qty 1

## 2019-11-16 MED ORDER — ARIPIPRAZOLE ER 400 MG IM SRER
400.0000 mg | INTRAMUSCULAR | Status: DC
  Administered 2019-11-16: 400 mg via INTRAMUSCULAR
  Filled 2019-11-16: qty 2

## 2019-11-16 MED ORDER — BENZTROPINE MESYLATE 0.5 MG PO TABS
0.5000 mg | ORAL_TABLET | Freq: Two times a day (BID) | ORAL | Status: DC
  Administered 2019-11-16 – 2019-11-22 (×11): 0.5 mg via ORAL
  Filled 2019-11-16 (×7): qty 1
  Filled 2019-11-16: qty 14
  Filled 2019-11-16: qty 1
  Filled 2019-11-16: qty 14
  Filled 2019-11-16 (×6): qty 1

## 2019-11-16 MED ORDER — RISPERIDONE 2 MG PO TABS
2.0000 mg | ORAL_TABLET | Freq: Two times a day (BID) | ORAL | Status: DC
  Administered 2019-11-16 – 2019-11-21 (×9): 2 mg via ORAL
  Filled 2019-11-16 (×12): qty 1

## 2019-11-16 MED ORDER — ALUM & MAG HYDROXIDE-SIMETH 200-200-20 MG/5ML PO SUSP: 30.0000 mL | ORAL | Status: DC | PRN

## 2019-11-16 MED ORDER — ACETAMINOPHEN 325 MG PO TABS
650.0000 mg | ORAL_TABLET | Freq: Four times a day (QID) | ORAL | Status: DC | PRN
  Administered 2019-11-17 – 2019-11-18 (×3): 650 mg via ORAL
  Filled 2019-11-16 (×3): qty 2

## 2019-11-16 MED ORDER — TEMAZEPAM 30 MG PO CAPS
30.0000 mg | ORAL_CAPSULE | Freq: Every day | ORAL | Status: DC
  Administered 2019-11-16 – 2019-11-21 (×6): 30 mg via ORAL
  Filled 2019-11-16 (×6): qty 1

## 2019-11-16 MED ORDER — MAGNESIUM HYDROXIDE 400 MG/5ML PO SUSP: 30.0000 mL | Freq: Every day | ORAL | Status: DC | PRN

## 2019-11-16 MED ORDER — TRAZODONE HCL 50 MG PO TABS
50.0000 mg | ORAL_TABLET | Freq: Every evening | ORAL | Status: DC | PRN
  Administered 2019-11-21: 50 mg via ORAL
  Filled 2019-11-16: qty 1

## 2019-11-16 NOTE — Progress Notes (Signed)
Patient ID: Jacob [name], male   DOB: 03-Oct-1991, 29 y.o.   MRN: [id number] D: Pt transferred from Observation unit to 503-1. Pt is pleasant and cooperative. Pt thought blocking and responding to internal stimuli. When treatment agreement being explained to pt he stated that he will not be taking any medication because "I don't need that." Pt states he would like to work on communication while he is here. A: Pt was offered support and encouragement. Pt introduced to unit milieu by nursing staff. Q 15 minute checks were done for safety.  R: Pt has no complaints or questions at this time. Safety maintained on unit.
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
## 2019-11-16 NOTE — Tx Team (Signed)
Initial Treatment Plan 11/16/2019 2:45 AM [name] LHT:[id number]    PATIENT STRESSORS: Marital or family conflict Medication change or noncompliance   PATIENT STRENGTHS: Physical Health Supportive family/friends   PATIENT IDENTIFIED PROBLEMS: Medication non-compliance  Family conflict  Psychosis                 DISCHARGE CRITERIA:  Adequate post-discharge living arrangements Improved stabilization in mood, thinking, and/or behavior Motivation to continue treatment in a less acute level of care Verbal commitment to aftercare and medication compliance  PRELIMINARY DISCHARGE PLAN: Attend PHP/IOP Outpatient therapy Placement in alternative living arrangements  PATIENT/FAMILY INVOLVEMENT: This treatment plan has been presented to and reviewed with the patient, Jacob [name], and/or family member.  The patient and family have been given the opportunity to ask questions and make suggestions.  Victorino December, RN 11/16/2019, 2:45 AM
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
## 2019-11-16 NOTE — [facility] Suicide Risk Assessment (Signed)
[facility] INPATIENT:  Family/Significant Other Suicide Prevention Education  Suicide Prevention Education:  Patient Refusal for Family/Significant Other Suicide Prevention Education: The patient Jacob [name] has refused to provide written consent for family/significant other to be provided Family/Significant Other Suicide Prevention Education during admission and/or prior to discharge.  Physician notified.  SPE completed with patient, as patient refused to consent to family contact. SPI pamphlet provided to pt and pt was encouraged to share information with support network, ask questions, and talk about any concerns relating to SPE. Patient denies access to guns/firearms and verbalized understanding of information provided. Mobile Crisis information also provided to patient.    [name] 11/16/2019, 2:12 PM

## 2019-11-16 NOTE — Tx Team (Signed)
Interdisciplinary Treatment and Diagnostic Plan Update  11/16/2019 Time of Session: 10:00am Kendan Cornforth MRN: [id number]  Principal Diagnosis: MDD (major depressive disorder), recurrent, severe, with psychosis ([place])  Secondary Diagnoses: Principal Problem:   MDD (major depressive disorder), recurrent, severe, with psychosis ([place])   Current Medications:  Current Facility-Administered Medications  Medication Dose Route Frequency Provider Last Rate Last Admin  . acetaminophen (TYLENOL) tablet 650 mg  650 mg Oral Q6H PRN Anike, Adaku C, NP      . alum & mag hydroxide-simeth (MAALOX/MYLANTA) 200-200-20 MG/5ML suspension 30 mL  30 mL Oral Q4H PRN Anike, Adaku C, NP      . hydrOXYzine (ATARAX/VISTARIL) tablet 25 mg  25 mg Oral TID PRN Anike, Adaku C, NP      . magnesium hydroxide (MILK OF MAGNESIA) suspension 30 mL  30 mL Oral Daily PRN Anike, Adaku C, NP      . traZODone (DESYREL) tablet 50 mg  50 mg Oral QHS PRN Anike, Adaku C, NP       PTA Medications: Medications Prior to Admission  Medication Sig Dispense Refill Last Dose  . ARIPiprazole ER (ABILIFY MAINTENA) 400 MG SRER injection Inject 2 mLs (400 mg total) into the muscle every 28 (twenty-eight) days. (Due on 09-29-18): For mood control 1 each 5 Unknown at Unknown time  . Cyanocobalamin (VITAMIN B-12 PO) Take 1 tablet by mouth daily.   Unknown at Unknown time  . fenofibrate (TRICOR) 48 MG tablet Take 48 mg by mouth daily.   Unknown at Unknown time  . FLUoxetine (PROZAC) 20 MG capsule Take 1 capsule (20 mg total) by mouth daily. For depression (Patient not taking: Reported on 11/17/2018) 30 capsule 0 Unknown at Unknown time  . Insulin Glargine (BASAGLAR KWIKPEN) 100 UNIT/ML SOPN Inject 0.1 mLs (10 Units total) into the skin daily. (Patient taking differently: Inject 20 Units into the skin daily. ) 3 mL 0 Unknown at Unknown time  . Insulin Pen Needle (PEN NEEDLES) 32G X 4 MM MISC 1 application by Does not apply route daily. 50 each 0 Unknown  at Unknown time  . metFORMIN (GLUCOPHAGE) 1000 MG tablet Take 1,000 mg by mouth 2 (two) times daily.   Unknown at Unknown time    Patient Stressors: Marital or family conflict Medication change or noncompliance  Patient Strengths: Physical Health Supportive family/friends  Treatment Modalities: Medication Management, Group therapy, Case management,  1 to 1 session with clinician, Psychoeducation, Recreational therapy.   Physician Treatment Plan for Primary Diagnosis: MDD (major depressive disorder), recurrent, severe, with psychosis ([place]) Long Term Goal(s): Improvement in symptoms so as ready for discharge Improvement in symptoms so as ready for discharge   Short Term Goals: Ability to identify changes in lifestyle to reduce recurrence of condition will improve Ability to demonstrate self-control will improve Ability to identify and develop effective coping behaviors will improve Ability to maintain clinical measurements within normal limits will improve Ability to identify triggers associated with substance abuse/mental health issues will improve Ability to identify changes in lifestyle to reduce recurrence of condition will improve Ability to demonstrate self-control will improve Ability to identify and develop effective coping behaviors will improve Ability to maintain clinical measurements within normal limits will improve Ability to identify triggers associated with substance abuse/mental health issues will improve  Medication Management: Evaluate patient's response, side effects, and tolerance of medication regimen.  Therapeutic Interventions: 1 to 1 sessions, Unit Group sessions and Medication administration.  Evaluation of Outcomes: Not Met  Physician Treatment Plan for  Secondary Diagnosis: Principal Problem:   MDD (major depressive disorder), recurrent, severe, with psychosis ([place])  Long Term Goal(s): Improvement in symptoms so as ready for discharge Improvement in  symptoms so as ready for discharge   Short Term Goals: Ability to identify changes in lifestyle to reduce recurrence of condition will improve Ability to demonstrate self-control will improve Ability to identify and develop effective coping behaviors will improve Ability to maintain clinical measurements within normal limits will improve Ability to identify triggers associated with substance abuse/mental health issues will improve Ability to identify changes in lifestyle to reduce recurrence of condition will improve Ability to demonstrate self-control will improve Ability to identify and develop effective coping behaviors will improve Ability to maintain clinical measurements within normal limits will improve Ability to identify triggers associated with substance abuse/mental health issues will improve     Medication Management: Evaluate patient's response, side effects, and tolerance of medication regimen.  Therapeutic Interventions: 1 to 1 sessions, Unit Group sessions and Medication administration.  Evaluation of Outcomes: Not Met   RN Treatment Plan for Primary Diagnosis: MDD (major depressive disorder), recurrent, severe, with psychosis ([place]) Long Term Goal(s): Knowledge of disease and therapeutic regimen to maintain health will improve  Short Term Goals: Ability to verbalize feelings will improve, Ability to identify and develop effective coping behaviors will improve and Compliance with prescribed medications will improve  Medication Management: RN will administer medications as ordered by provider, will assess and evaluate patient's response and provide education to patient for prescribed medication. RN will report any adverse and/or side effects to prescribing provider.  Therapeutic Interventions: 1 on 1 counseling sessions, Psychoeducation, Medication administration, Evaluate responses to treatment, Monitor vital signs and CBGs as ordered, Perform/monitor CIWA, COWS, AIMS and  Fall Risk screenings as ordered, Perform wound care treatments as ordered.  Evaluation of Outcomes: Not Met   LCSW Treatment Plan for Primary Diagnosis: MDD (major depressive disorder), recurrent, severe, with psychosis ([place]) Long Term Goal(s): Safe transition to appropriate next level of care at discharge, Engage patient in therapeutic group addressing interpersonal concerns.  Short Term Goals: Engage patient in aftercare planning with referrals and resources, Increase social support, Facilitate patient progression through stages of change regarding substance use diagnoses and concerns, Identify triggers associated with mental health/substance abuse issues and Increase skills for wellness and recovery  Therapeutic Interventions: Assess for all discharge needs, 1 to 1 time with Social worker, Explore available resources and support systems, Assess for adequacy in community support network, Educate family and significant other(s) on suicide prevention, Complete Psychosocial Assessment, Interpersonal group therapy.  Evaluation of Outcomes: Not Met   Progress in Treatment: Attending groups: No. New to unit. Participating in groups: No. Taking medication as prescribed: Yes. Toleration medication: Yes. Family/Significant other contact made: No, will contact:  supports if consents are granted. Patient understands diagnosis: No. Discussing patient identified problems/goals with staff: Yes. Medical problems stabilized or resolved: No. Denies suicidal/homicidal ideation: No. Issues/concerns per patient self-inventory: Yes.  New problem(s) identified: Yes, Describe:  conflict with family, legal issues, medication non-compliance.  New Short Term/Long Term Goal(s):medication management for mood stabilization; elimination of SI thoughts; development of comprehensive mental wellness/sobriety plan.  Patient Goals:    Discharge Plan or Barriers: Patient recently admitted to unit, CSW assessing for  appropriate referrals.   Reason for Continuation of Hospitalization: Anxiety Delusions  Hallucinations Medication stabilization  Estimated Length of Stay: 5-7 days  Attendees: Patient: 11/16/2019 10:38 AM  Physician: Dr.Farah 11/16/2019 10:38 AM  Nursing: Benjamine Mola, RN  11/16/2019 10:38 AM  RN Care Manager: 11/16/2019 10:38 AM  Social Worker: Stephanie Acre, [place] 11/16/2019 10:38 AM  Recreational Therapist:  11/16/2019 10:38 AM  Other:  11/16/2019 10:38 AM  Other:  11/16/2019 10:38 AM  Other: 11/16/2019 10:38 AM    Scribe for Treatment Team: Joellen [place], [place] 11/16/2019 10:38 AM

## 2019-11-16 NOTE — Progress Notes (Signed)
Recreation Therapy Notes  Date: 1.15.21 Time: 1000 Location: 500 Hall Dayroom  Group Topic: Triggers  Goal Area(s) Addresses:  Patient will identify biggest triggers. Patient will identify how to avoid triggers. Patient will identify how to deal with triggers post d/c.  Intervention: Worksheet   Activity: Triggers.  Patients will identify their biggest triggers, how to reduce exposure to triggers and how to deal with triggers head on when unable to avoid.  Education: Triggers, Discharge Planning  Education Outcome: Acknowledges understanding/In group clarification offered/Needs additional education.   Clinical Observations/Feedback: Pt did not attend group session.     Caroll Rancher, LRT/CTRS        Caroll Rancher A 11/16/2019 12:06 PM
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
## 2019-11-17 MED ORDER — CARBAMAZEPINE 100 MG PO CHEW
100.0000 mg | CHEWABLE_TABLET | Freq: Three times a day (TID) | ORAL | Status: DC
  Administered 2019-11-17 – 2019-11-22 (×13): 100 mg via ORAL
  Filled 2019-11-17: qty 1
  Filled 2019-11-17: qty 21
  Filled 2019-11-17 (×2): qty 1
  Filled 2019-11-17: qty 21
  Filled 2019-11-17 (×11): qty 1
  Filled 2019-11-17: qty 21
  Filled 2019-11-17 (×5): qty 1

## 2019-11-17 MED ORDER — METFORMIN HCL 500 MG PO TABS
500.0000 mg | ORAL_TABLET | Freq: Two times a day (BID) | ORAL | Status: DC
  Administered 2019-11-17 – 2019-11-18 (×3): 500 mg via ORAL
  Filled 2019-11-17 (×6): qty 1

## 2019-11-17 NOTE — [facility] Counselor (Signed)
Clinical Social Work Note  CSW received an urgent phone message from someone identifying herself as patient's grandmother Khy Pitre, asking to talk to CSW "because it's very important."  Both numbers left were tried, and HIPAA-compliant voicemails were left.  [phone], [phone].  Patient has not signed consent for [place] to talk with anyone, so receiving information would be the only allowable action.  Ambrose Mantle, LCSW 11/17/2019, 1:34 PM
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
## 2019-11-17 NOTE — [facility] Group Notes (Signed)
  [facility] LCSW Group Therapy Note  Date/Time:  11/17/2019 9:30-10:00am  Type of Therapy and Topic:  Group Therapy:  Feelings About Hospitalization  Participation Level:  Active   Description of Group This process group involved patients discussing their feelings related to being hospitalized, as well as the benefits they see to being in the hospital.  These feelings and benefits were itemized.  The group then brainstormed specific ways in which they could seek those same benefits when they discharge and return home.  Therapeutic Goals 1. Patient will identify and describe positive and negative feelings related to hospitalization 2. Patient will verbalize benefits of hospitalization to themselves personally 3. Patients will brainstorm together ways they can obtain similar benefits in the outpatient setting, identify barriers to wellness and possible solutions  Summary of Patient Progress:  The patient expressed his primary feelings about being hospitalized are "like a star about to blow up."  He continues to hope that his family will come pick him up, does not want to go to his brother's house because his brother smokes.  Would like to go to his father's house, talk to his probation officer, get a job and work for a month "without changing clothes" and then get my money and an AirBNB.  When asked why he would desire to wear the same clothes, he said it would be to prevent people from stealing things from him, and that he can keep his possessions in his pockets.  Therapeutic Modalities Cognitive Behavioral Therapy Motivational Interviewing    Ambrose Mantle, LCSW 11/17/2019, 1:32 PM
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
## 2019-11-18 LAB — GLUCOSE, CAPILLARY
Glucose-Capillary: 470 mg/dL — ABNORMAL HIGH (ref 70–99)
Glucose-Capillary: 164 mg/dL — ABNORMAL HIGH (ref 70–99)

## 2019-11-18 MED ORDER — INSULIN ASPART 100 UNIT/ML ~~LOC~~ SOLN: 0.0000 [IU] | Freq: Three times a day (TID) | SUBCUTANEOUS | Status: DC

## 2019-11-18 MED ORDER — ENSURE ENLIVE PO LIQD
237.0000 mL | Freq: Two times a day (BID) | ORAL | Status: DC
  Administered 2019-11-18 (×2): 237 mL via ORAL

## 2019-11-18 MED ORDER — INSULIN ASPART 100 UNIT/ML ~~LOC~~ SOLN
10.0000 [IU] | Freq: Once | SUBCUTANEOUS | Status: AC
  Administered 2019-11-18: 10 [IU] via SUBCUTANEOUS

## 2019-11-18 MED ORDER — METFORMIN HCL 500 MG PO TABS
1000.0000 mg | ORAL_TABLET | Freq: Two times a day (BID) | ORAL | Status: DC
  Administered 2019-11-19 – 2019-11-22 (×6): 1000 mg via ORAL
  Filled 2019-11-18: qty 28
  Filled 2019-11-18 (×8): qty 2
  Filled 2019-11-18: qty 28
  Filled 2019-11-18: qty 2
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
## 2019-11-18 NOTE — Progress Notes (Signed)
[facility] MD Progress Note  11/18/2019 11:47 AM Jacob [name]  MRN:  [id number] Subjective:  Patient is a 29 year old male with a past psychiatric history significant for probable schizophrenia who was admitted on 11/16/2019 after he was involuntarily committed and taken to our facility.  He had been noncompliant with medications, and was clearly psychotic at the time of admission with hallucinations and delusions.  Objective: Patient is seen and examined.  Patient is a 29 year old male with the above-stated past psychiatric history who is seen in follow-up.  He had been very pressured and tangential yesterday.  Tegretol was added.  He seems to be doing little bit better today.  His speech rate has decreased.  He denied any racing thoughts.  He is pacing, and a pacing up yesterday that he required an ice pack on his left ankle.  He denied any side effects to the Tegretol yesterday.  His vital signs are stable, he is afebrile.  He slept 6.75 hours last night.  His main focus today is talking to social work to "get an apartment".  His last blood sugar was on 1/15 and it was 135.  I will order Accu-Cheks.  No new laboratories.  Principal Problem: MDD (major depressive disorder), recurrent, severe, with psychosis ([place]) Diagnosis: Principal Problem:   MDD (major depressive disorder), recurrent, severe, with psychosis ([place])  Total Time spent with patient: 20 minutes  Past Psychiatric History: See admission H&P  Past Medical History:  Past Medical History:  Diagnosis Date  . Diabetes mellitus without complication ([place])    History reviewed. No pertinent surgical history. Family History: History reviewed. No pertinent family history. Family Psychiatric  History: See admission H&P Social History:  Social History   Substance and Sexual Activity  Alcohol Use No     Social History   Substance and Sexual Activity  Drug Use Not Currently    Social History   Socioeconomic History  . Marital status:  Single    Spouse name: Not on file  . Number of children: Not on file  . Years of education: Not on file  . Highest education level: Not on file  Occupational History  . Not on file  Tobacco Use  . Smoking status: Never Smoker  . Smokeless tobacco: Never Used  Substance and Sexual Activity  . Alcohol use: No  . Drug use: Not Currently  . Sexual activity: Not on file  Other Topics Concern  . Not on file  Social History Narrative  . Not on file   Social Determinants of Health   Financial Resource Strain:   . Difficulty of Paying Living Expenses: Not on file  Food Insecurity:   . Worried About Charity fundraiser in the Last Year: Not on file  . Ran Out of Food in the Last Year: Not on file  Transportation Needs:   . Lack of Transportation (Medical): Not on file  . Lack of Transportation (Non-Medical): Not on file  Physical Activity:   . Days of Exercise per Week: Not on file  . Minutes of Exercise per Session: Not on file  Stress:   . Feeling of Stress : Not on file  Social Connections:   . Frequency of Communication with Friends and Family: Not on file  . Frequency of Social Gatherings with Friends and Family: Not on file  . Attends Religious Services: Not on file  . Active Member of Clubs or Organizations: Not on file  . Attends Archivist Meetings: Not on file  .  Marital Status: Not on file   Additional Social History:    Pain Medications: See MAR Prescriptions: See MAR Over the Counter: See MAR History of alcohol / drug use?: Yes Name of Substance 1: Alcohol. 1 - Age of First Use: UTA 1 - Amount (size/oz): UTA 1 - Frequency: UTA 1 - Duration: UTA 1 - Last Use / Amount: UTA Name of Substance 2: Marijuana. 2 - Age of First Use: UTA 2 - Amount (size/oz): UTA 2 - Frequency: UTA 2 - Duration: UTA 2 - Last Use / Amount: UTA                Sleep: Good  Appetite:  Fair  Current Medications: Current Facility-Administered Medications   Medication Dose Route Frequency Provider Last Rate Last Admin  . acetaminophen (TYLENOL) tablet 650 mg  650 mg Oral Q6H PRN Anike, Adaku C, NP   650 mg at 11/18/19 0835  . alum & mag hydroxide-simeth (MAALOX/MYLANTA) 200-200-20 MG/5ML suspension 30 mL  30 mL Oral Q4H PRN Anike, Adaku C, NP      . ARIPiprazole ER (ABILIFY MAINTENA) injection 400 mg  400 mg Intramuscular Q28 days Malvin Johns, MD   400 mg at 11/16/19 1556  . benztropine (COGENTIN) tablet 0.5 mg  0.5 mg Oral BID Malvin Johns, MD   0.5 mg at 11/18/19 0834  . carbamazepine (TEGRETOL) chewable tablet 100 mg  100 mg Oral TID Antonieta Pert, MD   100 mg at 11/18/19 0834  . feeding supplement (ENSURE ENLIVE) (ENSURE ENLIVE) liquid 237 mL  237 mL Oral BID BM Antonieta Pert, MD   237 mL at 11/18/19 0951  . hydrOXYzine (ATARAX/VISTARIL) tablet 25 mg  25 mg Oral TID PRN Anike, Adaku C, NP      . magnesium hydroxide (MILK OF MAGNESIA) suspension 30 mL  30 mL Oral Daily PRN Anike, Adaku C, NP      . metFORMIN (GLUCOPHAGE) tablet 500 mg  500 mg Oral BID WC Antonieta Pert, MD   500 mg at 11/18/19 0834  . risperiDONE (RISPERDAL) tablet 2 mg  2 mg Oral BID Malvin Johns, MD   2 mg at 11/18/19 0834  . temazepam (RESTORIL) capsule 30 mg  30 mg Oral QHS Malvin Johns, MD   30 mg at 11/17/19 2102  . traZODone (DESYREL) tablet 50 mg  50 mg Oral QHS PRN Anike, Adaku C, NP        Lab Results: No results found for this or any previous visit (from the past 48 hour(s)).  Blood Alcohol level:  Lab Results  Component Value Date   ETH <10 11/16/2019   ETH <10 08/26/2018    Metabolic Disorder Labs: Lab Results  Component Value Date   HGBA1C 9.4 (H) 11/16/2019   MPG 223.08 11/16/2019   MPG 128.37 02/06/2018   No results found for: PROLACTIN Lab Results  Component Value Date   CHOL 178 11/16/2019   TRIG 41 11/16/2019   HDL 79 11/16/2019   CHOLHDL 2.3 11/16/2019   VLDL 8 11/16/2019   LDLCALC 91 11/16/2019   LDLCALC 114 (H) 02/06/2018     Physical Findings: AIMS: Facial and Oral Movements Muscles of Facial Expression: None, normal Lips and Perioral Area: None, normal Jaw: None, normal Tongue: None, normal,Extremity Movements Upper (arms, wrists, hands, fingers): None, normal Lower (legs, knees, ankles, toes): None, normal, Trunk Movements Neck, shoulders, hips: None, normal, Overall Severity Severity of abnormal movements (highest score from questions above): None, normal Incapacitation due  to abnormal movements: None, normal Patient's awareness of abnormal movements (rate only patient's report): No Awareness, Dental Status Current problems with teeth and/or dentures?: No Does patient usually wear dentures?: No  CIWA:  CIWA-Ar Total: 2 COWS:  COWS Total Score: 2  Musculoskeletal: Strength & Muscle Tone: within normal limits Gait & Station: normal Patient leans: N/A  Psychiatric Specialty Exam: Physical Exam  Nursing note and vitals reviewed. Constitutional: He appears well-developed and well-nourished.  HENT:  Head: Normocephalic and atraumatic.  Respiratory: Effort normal.  Neurological: He is alert.    Review of Systems  Blood pressure 136/86, pulse (!) 108, temperature (!) 97.3 F (36.3 C), temperature source Oral, resp. rate 16, height 5\' 10"  (1.778 m), weight 59 kg, SpO2 100 %.Body mass index is 18.65 kg/m.  General Appearance: Disheveled  Eye Contact:  Fair  Speech:  Pressured  Volume:  Normal  Mood:  Anxious  Affect:  Labile  Thought Process:  Goal Directed and Descriptions of Associations: Circumstantial  Orientation:  Full (Time, Place, and Person)  Thought Content:  Rumination  Suicidal Thoughts:  No  Homicidal Thoughts:  No  Memory:  Immediate;   Fair Recent;   Fair Remote;   Fair  Judgement:  Intact  Insight:  Fair  Psychomotor Activity:  Increased  Concentration:  Concentration: Fair and Attention Span: Fair  Recall:  of Knowledge:  Fair  Language:  Good  Akathisia:   Negative  Handed:  Right  AIMS (if indicated):     Assets:  Desire for Improvement Resilience  ADL's:  Intact  Cognition:  WNL  Sleep:  Number of Hours: 6.75     Treatment Plan Summary: Daily contact with patient to assess and evaluate symptoms and progress in treatment, Medication management and Plan : Patient is seen and examined.  Patient is a 29 year old male with the above-stated past psychiatric history who is seen in follow-up.   Diagnosis: #1 psychotic disorder, suggestive more of schizoaffective disorder; bipolar type versus bipolar disorder, #2 type 2 diabetes mellitus, #3 hypertension  Patient is seen in follow-up.  He has slowed down a bit today, so I will increase his Tegretol 200 mg p.o. 3 times daily.  No other changes in his medications.  He is on Metformin 500 mg p.o. twice daily.  We will get Accu-Cheks on him.  No other changes.   1.  Patient received his Abilify long-acting injection on 11/16/2019 for psychosis.  This is also for mood stability. 2.  Continue Cogentin 0.5 mg p.o. twice daily for side effects of medication. 3.  Continue Risperdal 2 mg p.o. twice daily for psychosis and mood stability. 4.  Continue temazepam 30 mg p.o. nightly for insomnia. 5.  Continue trazodone 50 mg p.o. nightly as needed insomnia. 6.  Continue Metformin 500 mg p.o. twice daily for diabetes mellitus type 2. 7.  Increase Tegretol to 100 mg p.o. 3 times daily titrate for mood stability. 8.  EKG to assess QTC prolongation given treatment with 2 antipsychotic agents. 9.  Disposition planning-in progress.  Patient stated that he is unable to return to his father's home after this most recent incident.    11/18/2019, MD 11/18/2019, 11:47 AM

## 2019-11-18 NOTE — [facility] Group Notes (Signed)
[facility] LCSW Group Therapy Note  Date/Time:  11/18/2019  11:00AM-12:00PM  Type of Therapy and Topic:  Group Therapy:  Music and Mood  Participation Level:  Active   Description of Group: In this process group, members listened to a variety of genres of music and identified that different types of music evoke different responses.  Patients were encouraged to identify music that was soothing for them and music that was energizing for them.  Patients discussed how this knowledge can help with wellness and recovery in various ways including managing depression and anxiety as well as encouraging healthy sleep habits.    Therapeutic Goals: Patients will explore the impact of different varieties of music on mood Patients will verbalize the thoughts they have when listening to different types of music Patients will identify music that is soothing to them as well as music that is energizing to them Patients will discuss how to use this knowledge to assist in maintaining wellness and recovery Patients will explore the use of music as a coping skill  Summary of Patient Progress:  At the beginning of group, patient expressed that he felt "excellent" and throughout group he partcipated fully.  At the end of group, patient expressed that he felt "like he was in a time warp" and "stupendous."    Therapeutic Modalities: Solution Focused Brief Therapy Activity   Ambrose Mantle, LCSW

## 2019-11-18 NOTE — Progress Notes (Signed)
Pt presents with an animated affect and anxious mood. Pt reported intermittent auditory hallucinations that are tolerable. Pt denies VH. Pt denies feeling paranoid. Pt denies SI/HI. Pt requesting to be discharged soon and is requesting a discharge date. Pt compliant with taking meds and no side effects to meds verbalized by the pt.    Orders reviewed with pt. Verbal support provided. Pt encouraged to attend groups. Self inventory sheet reviewed. V/s assessed. 15 minute checks performed for safety.  Pt compliant with tx plan.

## 2019-11-19 DIAGNOSIS — F333 Major depressive disorder, recurrent, severe with psychotic symptoms: Principal | ICD-10-CM

## 2019-11-19 LAB — GLUCOSE, CAPILLARY
Glucose-Capillary: 149 mg/dL — ABNORMAL HIGH (ref 70–99)
Glucose-Capillary: 187 mg/dL — ABNORMAL HIGH (ref 70–99)
Glucose-Capillary: 179 mg/dL — ABNORMAL HIGH (ref 70–99)

## 2019-11-19 MED ORDER — INSULIN ASPART 100 UNIT/ML ~~LOC~~ SOLN
0.0000 [IU] | Freq: Three times a day (TID) | SUBCUTANEOUS | Status: DC
  Administered 2019-11-19: 13:00:00 2 [IU] via SUBCUTANEOUS
  Administered 2019-11-19: 1 [IU] via SUBCUTANEOUS
  Administered 2019-11-19 – 2019-11-20 (×2): 2 [IU] via SUBCUTANEOUS
  Administered 2019-11-21: 21:00:00 7 [IU] via SUBCUTANEOUS

## 2019-11-19 NOTE — Progress Notes (Signed)
   11/19/19 2200  Psych Admission Type (Psych Patients Only)  Admission Status Involuntary  Psychosocial Assessment  Patient Complaints None  Eye Contact Fair  Facial Expression Animated  Affect Appropriate to circumstance  Speech Logical/coherent  Interaction Assertive  Motor Activity Pacing;Slow  Appearance/Hygiene Unremarkable  Behavior Characteristics Cooperative  Mood Pleasant  Thought Process  Coherency Disorganized;Flight of ideas  Content Compulsions  Delusions None reported or observed  Perception Hallucinations  Hallucination Auditory  Judgment Poor  Confusion None  Danger to Self  Current suicidal ideation? Denies  Danger to Others  Danger to Others None reported or observed

## 2019-11-19 NOTE — [facility] Group Notes (Signed)
LCSW Group Therapy Notes  Date and Time: 11/19/2019 @ 1:30pm  Type of Therapy and Topic: Group Therapy: Core Beliefs  Participation Level: [facility] PARTICIPATION LEVEL: Active  Description of Group: In this group patients will be encouraged to explore their negative and positive core beliefs about themselves, others, and the world. Each patient will be challenged to identify these beliefs and ways to challenge negative core beliefs. This group will be process-oriented, with patients participating in exploration of their own experiences as well as giving and receiving support and challenge from other group members.  Therapeutic Goals: 1. Patient will identify personal core beliefs, both negative and positive. 2. Patient will identify core beliefs relating to others, both negative and positive. 3. Patient will challenge their negative beliefs about themselves and others. 4. Patient will identify three changes they can make to replace negative core beliefs with positive beliefs.  Summary of Patient Progress  Due to the COVID-19 pandemic and due to Acuity of the Unit, this group has been supplemented with worksheets.  Therapeutic Modalities: Cognitive Behavioral Therapy Solution Focused Therapy Motivational Interviewing     Jacob [name], MSW, LCSW  

## 2019-11-19 NOTE — Progress Notes (Signed)
Writer spoke with patient 1:1 concerning his blood sugar. He reported that he has no symptoms that made him feel that his blood sugar was high. His CBG re-checked was 164 and he reported feeling fine. He has been mostly isolative to his room only to come out for snacks and medications. He is pleasant and cooperative, support given and safety maintained with 15 min checks.
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
## 2019-11-19 NOTE — Progress Notes (Signed)
Inpatient Diabetes Program Recommendations  AACE/ADA: New Consensus Statement on Inpatient Glycemic Control (2015)  Target Ranges:  Prepandial:   less than 140 mg/dL      Peak postprandial:   less than 180 mg/dL (1-2 hours)      Critically ill patients:  140 - 180 mg/dL   Lab Results  Component Value Date   GLUCAP 149 (H) 11/19/2019   HGBA1C 9.4 (H) 11/16/2019    Review of Glycemic Control  Diabetes history: DM2 Outpatient Diabetes medications: Basaglar 20 units QD, metformin 1000 mg bid Current orders for Inpatient glycemic control: Novolog 0-9 units tidwc, metformin 1000 mg bid  HgbA1C - 9.4% - poor glycemic control CBG 149 mg/dL this am  Inpatient Diabetes Program Recommendations:    If FBS > 180 mg/dL, add Lantus 10 units QD If post-prandial blood sugars > 180 mg/dL, add Novolog 3 units tidwc for meal coverage insulin.  HgbA1C - 9.4%. Uncontrolled diabetes. Needs to f/u with PCP, monitor blood sugars at home and take logbook to MD appt for review. Depression seems to be a large barrier to pt taking care of himself.  Continue to follow.  Thank you. Ailene Ards, RD, LDN, CDE Inpatient Diabetes Coordinator [phone]

## 2019-11-19 NOTE — Progress Notes (Signed)
Patient ID: Jacob [name], male   DOB: 01/03/1991, 30 y.o.   MRN: [id number]   CSW gave patient some housing resources to be able to obtain his own housing once discharged and to sign up for possible section 8 housing.

## 2019-11-19 NOTE — Progress Notes (Signed)
   11/18/19 2100  COVID-19 Daily Checkoff  Have you had a fever (temp > 37.80C/100F)  in the past 24 hours?  No  If you have had runny nose, nasal congestion, sneezing in the past 24 hours, has it worsened? No  COVID-19 EXPOSURE  Have you traveled outside the state in the past 14 days? No  Have you been in contact with someone with a confirmed diagnosis of COVID-19 or PUI in the past 14 days without wearing appropriate PPE? No  Have you been living in the same home as a person with confirmed diagnosis of COVID-19 or a PUI (household contact)? No  Have you been diagnosed with COVID-19? No

## 2019-11-19 NOTE — Plan of Care (Signed)
Progress note  D: pt found in the hallway; compliant with medication administration. Pt denies any physical complaints or pain. Pt seems less agitated and anxious today, resulting in less pacing. Pt is also being more observant of their sugar intake. Pt denies si/hi/ah/vh and verbally agrees to approach staff if these become apparent or before harming themself/others while at [facility].  A: Pt provided support and encouragement. Pt given medication per protocol and standing orders. Q85m safety checks implemented and continued.  R: Pt safe on the unit. Will continue to monitor.  Pt progressing in the following metrics  Problem: Education: Goal: Knowledge of [place] General Education information/materials will improve Outcome: Progressing Goal: Emotional status will improve Outcome: Progressing Goal: Mental status will improve Outcome: Progressing Goal: Verbalization of understanding the information provided will improve Outcome: Progressing
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
## 2019-11-20 LAB — GLUCOSE, CAPILLARY
Glucose-Capillary: 164 mg/dL — ABNORMAL HIGH (ref 70–99)
Glucose-Capillary: 261 mg/dL — ABNORMAL HIGH (ref 70–99)

## 2019-11-20 NOTE — Progress Notes (Signed)
Recreation Therapy Notes  Date: 1.19.21 Time: 0950 Location: 500 Hall Dayroom   Group Topic: Communication, Team Building, Problem Solving  Goal Area(s) Addresses:  Patient will effectively work with peer towards shared goal.  Patient will identify skill used to make activity successful.  Patient will identify how skills used during activity can be used to reach post d/c goals.   Behavioral Response: Engaged  Intervention: STEM Activity   Activity: [employer]. Patients were provided the following materials: 5 drinking straws, 5 rubber bands, 5 paper clips, 2 index cards and 2 drinking cups. Using the provided materials patients were asked to build a launching mechanisms to launch a ping pong ball approximately 12 feet. Patients were divided into teams of 3-5.   Education: Pharmacist, community, Building control surveyor.   Education Outcome: Acknowledges education/In group clarification offered/Needs additional education.   Clinical Observations/Feedback: Pt was more of the leader in his group.  Pt would listen to the suggestions of his partner and incorporate them into building the launcher.  At one point, pt ended up working by himself while his partner observed.  Pt appeared to be very engaged and focused on completing the activity.    Caroll Rancher, LRT/CTRS    Caroll Rancher A 11/20/2019 10:43 AM

## 2019-11-20 NOTE — Progress Notes (Signed)
   11/20/19 1900  Psych Admission Type (Psych Patients Only)  Admission Status Involuntary  Psychosocial Assessment  Patient Complaints None  Eye Contact Fair  Facial Expression Animated  Affect Appropriate to circumstance  Speech Logical/coherent  Interaction Assertive  Motor Activity Pacing;Slow  Appearance/Hygiene Unremarkable  Behavior Characteristics Cooperative  Mood Pleasant  Thought Process  Coherency Disorganized;Flight of ideas  Content Compulsions  Delusions None reported or observed  Perception Hallucinations  Hallucination Auditory  Judgment Poor  Confusion None  Danger to Self  Current suicidal ideation? Denies  Danger to Others  Danger to Others None reported or observed

## 2019-11-20 NOTE — Progress Notes (Signed)
Patient ID: Jacob [name], male   DOB: [birth year], 29 y.o.   MRN: [id number]   CSW left a voicemail for pt's probation officer per his request. Officer Tamala Ser [phone])
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
## 2019-11-21 LAB — GLUCOSE, CAPILLARY
Glucose-Capillary: 226 mg/dL — ABNORMAL HIGH (ref 70–99)
Glucose-Capillary: 316 mg/dL — ABNORMAL HIGH (ref 70–99)

## 2019-11-21 MED ORDER — RISPERIDONE 3 MG PO TABS
3.0000 mg | ORAL_TABLET | Freq: Two times a day (BID) | ORAL | Status: DC
  Administered 2019-11-21 – 2019-11-22 (×2): 3 mg via ORAL
  Filled 2019-11-21 (×2): qty 1
  Filled 2019-11-21: qty 14
  Filled 2019-11-21: qty 1
  Filled 2019-11-21: qty 14
  Filled 2019-11-21: qty 1

## 2019-11-21 NOTE — Progress Notes (Signed)
Recreation Therapy Notes  Date: 1.20.21 Time: 1000 Location: 500 Hall Dayroom  Group Topic: Wellness  Goal Area(s) Addresses:  Patient will define components of whole wellness. Patient will verbalize benefit of whole wellness.  Behavioral Response: Engaged  Intervention: Music   Activity: Exercise.  LRT and patients went through 3 rounds of exercises which consisted of push-ups, jumping jacks, lunges, mountain climbers and sit-ups.  Patients were encouraged to take breaks and get water as needed.  Education: Wellness, Building control surveyor.   Education Outcome: Acknowledges education/In group clarification offered/Needs additional education.   Clinical Observations/Feedback:  Pt completed the exercises.  Pt took a break and got water when he needed it.  Pt was bright and appropriate in group session.    Caroll Rancher, LRT/CTRS    Caroll Rancher A 11/21/2019 10:57 AM

## 2019-11-21 NOTE — Progress Notes (Signed)
Patient denies SI, HI and AVH this shift.  Patient had questions about discharge planning and medications.  Patient had no incidents of behavioral dyscontrol.   Assess patient for safety, offer medications as prescribed, engage patient in 1:1 staff talks.   Continue to monitor as planned. Patient able to contract for safety.

## 2019-11-21 NOTE — Progress Notes (Addendum)
Inpatient Diabetes Program Recommendations  AACE/ADA: New Consensus Statement on Inpatient Glycemic Control (2015)  Target Ranges:  Prepandial:   less than 140 mg/dL      Peak postprandial:   less than 180 mg/dL (1-2 hours)      Critically ill patients:  140 - 180 mg/dL   Lab Results  Component Value Date   GLUCAP 226 (H) 11/21/2019   HGBA1C 9.4 (H) 11/16/2019    Review of Glycemic Control  Diabetes history: DM2 Outpatient Diabetes medications: Basaglar 20 units QD, metformin 1000 mg bid Current orders for Inpatient glycemic control: Novolog 0-9 units tidwc, metformin 1000 mg bid    HgbA1C - 9.4% - poor glycemic control CBG 226 mg/dL this am  Inpatient Diabetes Program Recommendations:     If FBS > 180 mg/dL, add Lantus 10 units QD If post-prandial blood sugars > 180 mg/dL, add Novolog 3 units tidwc for meal coverage insulin.  HgbA1C - 9.4%. Uncontrolled diabetes. Needs to f/u with PCP, monitor blood sugars at home and take logbook to MD appt for review. Depression seems to be a large barrier to pt taking care of himself.  Thank you. Ailene Ards, RD, LDN, CDE Inpatient Diabetes Coordinator [phone]

## 2019-11-21 NOTE — Progress Notes (Addendum)
BH MD/PA/NP OP Progress Note  11/21/2019 8:58 AM Jacob [name]  MRN:  [id number]  Subjective: "Happy and Confused"  Patient requests that another patient joins for conversation. He says that he is feeling happy and confused. He is still refusing to take his insulin because he believes that there is not enough in the syringe. At home he says he takes "20 deciliters" but here it is only ".2 deciliters". He also says that the needle leaves holes in his stomach. He lifted his shirt to display the "holes". Today he says he ate a breakfast of bacon, sausage and hashbrowns.   Patient says that he had dreams of things that he had not experienced last night. Specifically he dreamed of a gay bar and many shots. He feels less drowsy today on the medication. Patient was still fairly restless throughout the conversation, similar to yesterday.   Patient denies AH/VH/SI/HI.   Visit Diagnosis: No diagnosis found.  Past Psychiatric History: Major depressive disorder, recurrent.  Several past admissions at [facility] for mood stabilization treatments. Discharged on Abilify monthly injections, Abilify tablets, Prozac, Vistaril & Trazodone following last discharge on 09/04/18.   Past Medical History:  Past Medical History:  Diagnosis Date  . Diabetes mellitus without complication ([facility])    History reviewed. No pertinent surgical history.  Family Psychiatric History: Substance use disorder: mother.  Family History: History reviewed. No pertinent family history.  Social History:  Social History   Socioeconomic History  . Marital status: Single    Spouse name: Not on file  . Number of children: Not on file  . Years of education: Not on file  . Highest education level: Not on file  Occupational History  . Not on file  Tobacco Use  . Smoking status: Never Smoker  . Smokeless tobacco: Never Used  Substance and Sexual Activity  . Alcohol use: No  . Drug use: Not Currently  . Sexual activity: Not on file   Other Topics Concern  . Not on file  Social History Narrative  . Not on file   Social Determinants of Health   Financial Resource Strain:   . Difficulty of Paying Living Expenses: Not on file  Food Insecurity:   . Worried About Programme researcher, broadcasting/film/video in the Last Year: Not on file  . Ran Out of Food in the Last Year: Not on file  Transportation Needs:   . Lack of Transportation (Medical): Not on file  . Lack of Transportation (Non-Medical): Not on file  Physical Activity:   . Days of Exercise per Week: Not on file  . Minutes of Exercise per Session: Not on file  Stress:   . Feeling of Stress : Not on file  Social Connections:   . Frequency of Communication with Friends and Family: Not on file  . Frequency of Social Gatherings with Friends and Family: Not on file  . Attends Religious Services: Not on file  . Active Member of Clubs or Organizations: Not on file  . Attends Banker Meetings: Not on file  . Marital Status: Not on file    Allergies: No Known Allergies  Metabolic Disorder Labs: Lab Results  Component Value Date   HGBA1C 9.4 (H) 11/16/2019   MPG 223.08 11/16/2019   MPG 128.37 02/06/2018   No results found for: PROLACTIN Lab Results  Component Value Date   CHOL 178 11/16/2019   TRIG 41 11/16/2019   HDL 79 11/16/2019   CHOLHDL 2.3 11/16/2019   VLDL 8 11/16/2019  LDLCALC 91 11/16/2019   LDLCALC 114 (H) 02/06/2018   Lab Results  Component Value Date   TSH 1.568 11/16/2019   TSH 1.277 02/06/2018    Therapeutic Level Labs: No results found for: LITHIUM No results found for: VALPROATE No components found for:  CBMZ  Current Medications: Current Facility-Administered Medications  Medication Dose Route Frequency Provider Last Rate Last Admin  . acetaminophen (TYLENOL) tablet 650 mg  650 mg Oral Q6H PRN Anike, Adaku C, NP   650 mg at 11/18/19 0835  . alum & mag hydroxide-simeth (MAALOX/MYLANTA) 200-200-20 MG/5ML suspension 30 mL  30 mL Oral  Q4H PRN Anike, Adaku C, NP      . ARIPiprazole ER (ABILIFY MAINTENA) injection 400 mg  400 mg Intramuscular Q28 days Malvin Johns, MD   400 mg at 11/16/19 1556  . benztropine (COGENTIN) tablet 0.5 mg  0.5 mg Oral BID Malvin Johns, MD   0.5 mg at 11/21/19 0736  . carbamazepine (TEGRETOL) chewable tablet 100 mg  100 mg Oral TID Antonieta Pert, MD   100 mg at 11/21/19 0736  . hydrOXYzine (ATARAX/VISTARIL) tablet 25 mg  25 mg Oral TID PRN Anike, Adaku C, NP   25 mg at 11/21/19 0328  . insulin aspart (novoLOG) injection 0-9 Units  0-9 Units Subcutaneous TID WC Jackelyn Poling, NP   2 Units at 11/20/19 [phone]  . magnesium hydroxide (MILK OF MAGNESIA) suspension 30 mL  30 mL Oral Daily PRN Anike, Adaku C, NP      . metFORMIN (GLUCOPHAGE) tablet 1,000 mg  1,000 mg Oral BID WC Antonieta Pert, MD   1,000 mg at 11/21/19 0736  . risperiDONE (RISPERDAL) tablet 2 mg  2 mg Oral BID Malvin Johns, MD   2 mg at 11/21/19 0736  . temazepam (RESTORIL) capsule 30 mg  30 mg Oral QHS Malvin Johns, MD   30 mg at 11/20/19 2113  . traZODone (DESYREL) tablet 50 mg  50 mg Oral QHS PRN Anike, Adaku C, NP         Musculoskeletal: Strength & Muscle Tone: within normal limits Gait & Station: normal Patient leans: N/A  Psychiatric Specialty Exam: Review of Systems  Blood pressure (!) 132/95, pulse 89, temperature 98.6 F (37 C), temperature source Oral, resp. rate 18, height 5\' 10"  (1.778 m), weight 59 kg, SpO2 100 %.Body mass index is 18.65 kg/m.  General Appearance: Fairly Groomed  Eye Contact:  Minimal  Speech:  Normal Rate  Volume:  Normal  Mood:  Euphoric and confused  Affect:  Appropriate  Thought Process:  Irrelevant  Orientation:  NA  Thought Content: Illogical, Obsessions and Tangential   Suicidal Thoughts:  No  Homicidal Thoughts:  No  Memory:  NA  Judgement:  Poor  Insight:  Lacking  Psychomotor Activity:  Normal  Concentration:  Concentration: Grossly intact and Attention Span: Grossly intact   Recall:  NA  Fund of Knowledge: NA  Language: Good  Akathisia:  Yes; patient restless  Handed: NA  AIMS (if indicated): not done  Assets:  Communication Skills Desire for Improvement Social Support  ADL's:  Intact  Cognition: WNL  Sleep:  Good   Screenings: AIMS     Ad[facility]Current[facility] Admission (Discharged) [facility] Admission (Discharged) [facility]  AIMS Total Score  0  0  0    AUDIT  Admission (Current) from OP Visit from 11/15/2019 in [facility] Admission (Discharged) from 02/04/2018 in [facility]  Alcohol Use Disorder Identification Test Final Score (AUDIT)  2  0    PHQ2-9     Office Visit from 11/06/2018 in [facility] at [facility] Total Score  0       Assessment and Plan: Patient compliant with antipsychotics but not with insulin. Uncomfortable with smaller dose of different type of insulin.   Will switch to long acting insulin so patient can receive larger dose once a day.   As far as disposition he is not welcome with the mother she fears he will be the source of her eviction from the apartment due to his disruption but will be staying with grandmother further he is refusing medication so we will clearly need long-acting injectable. Tentative plan for discharge tomorrow.   Finally more compliant, spoke with mother to help clarify insulin Mother wants him to go to halfway house" or for [place] to "find him a place to live" (has no insurance)  Celene Skeen, Medical Student 11/21/2019, 8:58 AM

## 2019-11-21 NOTE — Progress Notes (Signed)
Pt blood sugar 316 this evening. Pt has been refusing his insulin during the day. Pt was informed that NP-Adaku stated he had to take the insulin. Pt agreed to take the insulin on his sliding scale. Pt received 7 units per [facility]

## 2019-11-21 NOTE — Progress Notes (Signed)
   11/21/19 2000  Psych Admission Type (Psych Patients Only)  Admission Status Involuntary  Psychosocial Assessment  Patient Complaints None  Eye Contact Fair  Facial Expression Animated  Affect Appropriate to circumstance  Speech Logical/coherent  Interaction Assertive  Motor Activity Pacing;Slow  Appearance/Hygiene Unremarkable  Behavior Characteristics Cooperative  Mood Pleasant  Thought Process  Coherency Disorganized;Flight of ideas  Content Compulsions  Delusions None reported or observed  Perception Hallucinations  Hallucination Auditory  Judgment Poor  Confusion None  Danger to Self  Current suicidal ideation? Denies  Danger to Others  Danger to Others None reported or observed

## 2019-11-21 NOTE — Tx Team (Signed)
Interdisciplinary Treatment and Diagnostic Plan Update  11/21/2019 Time of Session: 10:00am Jacob [name] MRN: [id number]  Principal Diagnosis: MDD (major depressive disorder), recurrent, severe, with psychosis (HCC)  Secondary Diagnoses: Principal Problem:   MDD (major depressive disorder), recurrent, severe, with psychosis (HCC)   Current Medications:  Current Facility-Administered Medications  Medication Dose Route Frequency Provider Last Rate Last Admin  . acetaminophen (TYLENOL) tablet 650 mg  650 mg Oral Q6H PRN Anike, Adaku C, NP   650 mg at 11/18/19 0835  . alum & mag hydroxide-simeth (MAALOX/MYLANTA) 200-200-20 MG/5ML suspension 30 mL  30 mL Oral Q4H PRN Anike, Adaku C, NP      . ARIPiprazole ER (ABILIFY MAINTENA) injection 400 mg  400 mg Intramuscular Q28 days Malvin Johns, MD   400 mg at 11/16/19 1556  . benztropine (COGENTIN) tablet 0.5 mg  0.5 mg Oral BID Malvin Johns, MD   0.5 mg at 11/21/19 0736  . carbamazepine (TEGRETOL) chewable tablet 100 mg  100 mg Oral TID Antonieta Pert, MD   100 mg at 11/21/19 0736  . hydrOXYzine (ATARAX/VISTARIL) tablet 25 mg  25 mg Oral TID PRN Anike, Adaku C, NP   25 mg at 11/21/19 0328  . insulin aspart (novoLOG) injection 0-9 Units  0-9 Units Subcutaneous TID WC Jackelyn Poling, NP   2 Units at 11/20/19 [phone]  . magnesium hydroxide (MILK OF MAGNESIA) suspension 30 mL  30 mL Oral Daily PRN Anike, Adaku C, NP      . metFORMIN (GLUCOPHAGE) tablet 1,000 mg  1,000 mg Oral BID WC Antonieta Pert, MD   1,000 mg at 11/21/19 0736  . risperiDONE (RISPERDAL) tablet 3 mg  3 mg Oral BID Malvin Johns, MD      . temazepam (RESTORIL) capsule 30 mg  30 mg Oral QHS Malvin Johns, MD   30 mg at 11/20/19 2113  . traZODone (DESYREL) tablet 50 mg  50 mg Oral QHS PRN Anike, Adaku C, NP       PTA Medications: Medications Prior to Admission  Medication Sig Dispense Refill Last Dose  . ARIPiprazole ER (ABILIFY MAINTENA) 400 MG SRER injection Inject 2 mLs (400 mg  total) into the muscle every 28 (twenty-eight) days. (Due on 09-29-18): For mood control 1 each 5 Unknown at Unknown time  . Cyanocobalamin (VITAMIN B-12 PO) Take 1 tablet by mouth daily.   Not Taking at Unknown time  . fenofibrate (TRICOR) 48 MG tablet Take 48 mg by mouth daily.   Not Taking at Unknown time  . FLUoxetine (PROZAC) 20 MG capsule Take 1 capsule (20 mg total) by mouth daily. For depression (Patient not taking: Reported on 11/17/2018) 30 capsule 0 Unknown at Unknown time  . Insulin Glargine (BASAGLAR KWIKPEN) 100 UNIT/ML SOPN Inject 0.1 mLs (10 Units total) into the skin daily. (Patient taking differently: Inject 20 Units into the skin daily. ) 3 mL 0 Unknown at Unknown time  . Insulin Pen Needle (PEN NEEDLES) 32G X 4 MM MISC 1 application by Does not apply route daily. 50 each 0 Unknown at Unknown time  . metFORMIN (GLUCOPHAGE) 1000 MG tablet Take 1,000 mg by mouth 2 (two) times daily.   Unknown at Unknown time    Patient Stressors: Marital or family conflict Medication change or noncompliance  Patient Strengths: Physical Health Supportive family/friends  Treatment Modalities: Medication Management, Group therapy, Case management,  1 to 1 session with clinician, Psychoeducation, Recreational therapy.   Physician Treatment Plan for Primary Diagnosis: MDD (major depressive  disorder), recurrent, severe, with psychosis (HCC) Long Term Goal(s): Improvement in symptoms so as ready for discharge Improvement in symptoms so as ready for discharge   Short Term Goals: Ability to identify changes in lifestyle to reduce recurrence of condition will improve Ability to verbalize feelings will improve Ability to demonstrate self-control will improve Ability to identify and develop effective coping behaviors will improve Compliance with prescribed medications will improve Ability to identify triggers associated with substance abuse/mental health issues will improve  Medication Management:  Evaluate patient's response, side effects, and tolerance of medication regimen.  Therapeutic Interventions: 1 to 1 sessions, Unit Group sessions and Medication administration.  Evaluation of Outcomes: Progressing  Physician Treatment Plan for Secondary Diagnosis: Principal Problem:   MDD (major depressive disorder), recurrent, severe, with psychosis (HCC)  Long Term Goal(s): Improvement in symptoms so as ready for discharge Improvement in symptoms so as ready for discharge   Short Term Goals: Ability to identify changes in lifestyle to reduce recurrence of condition will improve Ability to verbalize feelings will improve Ability to demonstrate self-control will improve Ability to identify and develop effective coping behaviors will improve Compliance with prescribed medications will improve Ability to identify triggers associated with substance abuse/mental health issues will improve     Medication Management: Evaluate patient's response, side effects, and tolerance of medication regimen.  Therapeutic Interventions: 1 to 1 sessions, Unit Group sessions and Medication administration.  Evaluation of Outcomes: Progressing   RN Treatment Plan for Primary Diagnosis: MDD (major depressive disorder), recurrent, severe, with psychosis (HCC) Long Term Goal(s): Knowledge of disease and therapeutic regimen to maintain health will improve  Short Term Goals: Ability to verbalize feelings will improve, Ability to identify and develop effective coping behaviors will improve and Compliance with prescribed medications will improve  Medication Management: RN will administer medications as ordered by provider, will assess and evaluate patient's response and provide education to patient for prescribed medication. RN will report any adverse and/or side effects to prescribing provider.  Therapeutic Interventions: 1 on 1 counseling sessions, Psychoeducation, Medication administration, Evaluate responses to  treatment, Monitor vital signs and CBGs as ordered, Perform/monitor CIWA, COWS, AIMS and Fall Risk screenings as ordered, Perform wound care treatments as ordered.  Evaluation of Outcomes: Progressing   LCSW Treatment Plan for Primary Diagnosis: MDD (major depressive disorder), recurrent, severe, with psychosis (HCC) Long Term Goal(s): Safe transition to appropriate next level of care at discharge, Engage patient in therapeutic group addressing interpersonal concerns.  Short Term Goals: Engage patient in aftercare planning with referrals and resources, Increase social support, Facilitate patient progression through stages of change regarding substance use diagnoses and concerns, Identify triggers associated with mental health/substance abuse issues and Increase skills for wellness and recovery  Therapeutic Interventions: Assess for all discharge needs, 1 to 1 time with Social worker, Explore available resources and support systems, Assess for adequacy in community support network, Educate family and significant other(s) on suicide prevention, Complete Psychosocial Assessment, Interpersonal group therapy.  Evaluation of Outcomes: Progressing   Progress in Treatment: Attending groups: Yes.  Participating in groups: Yes. Taking medication as prescribed: Yes. Toleration medication: Yes. Family/Significant other contact made: Yes, individual(s) contacted:  with pt Patient understands diagnosis: No. Discussing patient identified problems/goals with staff: Yes. Medical problems stabilized or resolved: No. Denies suicidal/homicidal ideation: No. Issues/concerns per patient self-inventory: Yes.  New problem(s) identified: Yes, Describe:  conflict with family, legal issues, medication non-compliance.  New Short Term/Long Term Goal(s):medication management for mood stabilization; elimination of SI thoughts; development  of comprehensive mental wellness/sobriety plan.  Patient Goals:    Discharge  Plan or Barriers: Patient recently admitted to unit, CSW assessing for appropriate referrals.   Reason for Continuation of Hospitalization: Anxiety Delusions  Hallucinations Medication stabilization  Estimated Length of Stay: 5-7 days  Attendees: Patient: Jacob [name] 11/21/2019 10:28 AM  Physician: Dr.Farah 11/21/2019 10:28 AM  Nursing: Benjamine Mola, RN 11/21/2019 10:28 AM  RN Care Manager: 11/21/2019 10:28 AM  Social Worker: Stephanie Acre, [place] 11/21/2019 10:28 AM  Recreational Therapist:  11/21/2019 10:28 AM  Other: Ovidio Kin, MSW intern  11/21/2019 10:28 AM  Other:  11/21/2019 10:28 AM  Other: 11/21/2019 10:28 AM    Scribe for Treatment Team: Billey Chang, Student-Social Work 11/21/2019 10:28 AM

## 2019-11-22 LAB — GLUCOSE, CAPILLARY: Glucose-Capillary: 153 mg/dL — ABNORMAL HIGH (ref 70–99)

## 2019-11-22 MED ORDER — TRAZODONE HCL 150 MG PO TABS
150.0000 mg | ORAL_TABLET | Freq: Every evening | ORAL | Status: DC | PRN
  Filled 2019-11-22: qty 7

## 2019-11-22 MED ORDER — RISPERIDONE 3 MG PO TABS: 6.0000 mg | ORAL_TABLET | Freq: Every day | ORAL | 2 refills | Status: DC

## 2019-11-22 MED ORDER — CARBAMAZEPINE 100 MG PO CHEW: CHEWABLE_TABLET | ORAL | 2 refills | Status: DC

## 2019-11-22 MED ORDER — RISPERIDONE 3 MG PO TABS
6.0000 mg | ORAL_TABLET | Freq: Every day | ORAL | Status: DC
  Filled 2019-11-22: qty 14

## 2019-11-22 MED ORDER — CARBAMAZEPINE 100 MG PO CHEW
100.0000 mg | CHEWABLE_TABLET | Freq: Every day | ORAL | Status: DC
  Filled 2019-11-22 (×2): qty 21

## 2019-11-22 MED ORDER — TRAZODONE HCL 150 MG PO TABS: 150.0000 mg | ORAL_TABLET | Freq: Every evening | ORAL | 1 refills | Status: DC | PRN

## 2019-11-22 MED ORDER — BENZTROPINE MESYLATE 0.5 MG PO TABS: 0.5000 mg | ORAL_TABLET | Freq: Two times a day (BID) | ORAL | 2 refills | Status: DC

## 2019-11-22 NOTE — Progress Notes (Signed)
Patient ID: Jacob [name], male   DOB: 07/22/1991, 29 y.o.   MRN: [id number] Patient discharged to home/self care on his own accord.  Patient denies SI, HI and AVH this upon discharge.  Patient acknowledged understanding of all discharge instructions and receipt of personal belongings.

## 2019-11-22 NOTE — Plan of Care (Signed)
Pt was able to identify coping strategies to use post discharge at completion of recreation therapy group sessions.   Caroll Rancher, LRT/CTRS

## 2019-11-22 NOTE — Progress Notes (Signed)
Recreation Therapy Notes  INPATIENT RECREATION TR PLAN  Patient Details Name: Jacob [name] MRN: [id number] DOB: Dec 08, 1990 Today's Date: 11/22/2019  Rec Therapy Plan Is patient appropriate for Therapeutic Recreation?: Yes Treatment times per week: about 3 days Estimated Length of Stay: 5-7 days TR Treatment/Interventions: Group participation (Comment)  Discharge Criteria Pt will be discharged from therapy if:: Discharged Treatment plan/goals/alternatives discussed and agreed upon by:: Patient/family  Discharge Summary Short term goals set: See care plan Short term goals met: Complete Progress toward goals comments: Groups attended Which groups?: Wellness, Anger management, Other (Comment)(Anxiety, Team building) Reason goals not met: None Therapeutic equipment acquired: N/A Reason patient discharged from therapy: Discharge from hospital Pt/family agrees with progress & goals achieved: Yes Date patient discharged from therapy: 11/22/19    Victorino Sparrow, LRT/CTRS  Ria Comment, Yanette Tripoli A 11/22/2019, 11:19 AM

## 2019-11-22 NOTE — Progress Notes (Signed)
Recreation Therapy Notes  Date: 1.21.21 Time: 0950 Location: 500 Hall Dayroom  Group Topic: Anxiety  Goal Area(s) Addresses:  Patient will identify triggers to anxiety. Patient will identify symptoms of anxiety. Patient will identify coping strategies for dealing with anxiety post d/c.  Behavioral Response: Engaged  Intervention: Worksheet, pencils  Activity: Introduction to Anxiety.  Patients were to identify three things that trigger their anxiety.  Patients were to also identify physical symptoms and thoughts they have when anxious.  Patients then identified three positive ways to cope with anxiety.  Education: Communication, Discharge Planning  Education Outcome: Acknowledges understanding/In group clarification offered/Needs additional education.   Clinical Observations/Feedback: Pt identified pleasure, pressure and desire as triggers to his anxiety.  Pt expressed physical symptoms to anxiety are walking, working out and eating food/sweets.  Pt stated thoughts he has when anxious are welcoming, blessing and bye.  Pt identified coping skills as walking, talking and sex.    Caroll Rancher, LRT/CTRS    Caroll Rancher A 11/22/2019 10:48 AM

## 2019-11-22 NOTE — Progress Notes (Signed)
  [facility] Adult Case Management Discharge Plan :  Will you be returning to the same living situation after discharge:  Yes,  home with grandmother At discharge, do you have transportation home?: Yes,  Safe Transport Do you have the ability to pay for your medications: No.; [facility]  Release of information consent forms completed and in the chart;  Patient's signature needed at discharge.  Patient to Follow up at: Follow-up Information    [facility] Follow up on 11/27/2019.   Why: You are scheduled for an appointment on 11/27/19 at 8:30 am.  This is a virtual appointment.  Please have your insurance information and your discharge summary available for this appointment. Contact information: [address] [place] [address] [phone]           Next level of care provider has access to [facility] Link:no  Safety Planning and Suicide Prevention discussed: No.SPE with pt      Has patient been referred to the Quitline?: N/A patient is not a smoker  Patient has been referred for addiction treatment: Yes  Delphia Grates, LCSW 11/22/2019, 11:19 AM
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
## 2019-11-22 NOTE — [facility] Suicide Risk Assessment (Signed)
[facility] Discharge Suicide Risk Assessment   Principal Problem: MDD (major depressive disorder), recurrent, severe, with psychosis (HCC) Discharge Diagnoses: Principal Problem:   MDD (major depressive disorder), recurrent, severe, with psychosis (HCC)   Total Time spent with patient: 45 minutes Musculoskeletal: Strength & Muscle Tone: within normal limits Gait & Station: normal Patient leans: N/A  Psychiatric Specialty Exam: Physical Exam  Review of Systems  Blood pressure 122/89, pulse 93, temperature 98.7 F (37.1 C), temperature source Oral, resp. rate 18, height 5\' 10"  (1.778 m), weight 59 kg, SpO2 100 %.Body mass index is 18.65 kg/m.  General Appearance: Casual  Eye Contact:  Good  Speech:  Clear and Coherent  Volume:  Normal  Mood:  Euthymic  Affect:  Congruent  Thought Process:  Linear and Descriptions of Associations: Circumstantial  Orientation:  Full (Time, Place, and Person)  Thought Content:  Denies all positive symptoms no thoughts of harming self or others  Suicidal Thoughts:  No  Homicidal Thoughts:  No  Memory:  Immediate;   Fair Recent;   Fair Remote;   Fair  Judgement:  Fair  Insight:  Fair  Psychomotor Activity:  Normal  Concentration:  Concentration: Fair and Attention Span: Fair  Recall:  of Knowledge:  Fair  Language:  Good  Akathisia:  Negative  Handed:  Right  AIMS (if indicated):     Assets:  Communication Skills Housing Leisure Time Physical Health Resilience Social Support  ADL's:  Intact  Cognition:  WNL  Sleep:  Number of Hours: 5.75   Mental Status Per Nursing Assessment::   On Admission:  Intention to act on plan to harm others, Suicidal ideation indicated by others  Demographic Factors:  Male  Loss Factors: Decrease in vocational status  Historical Factors: Impulsivity  Risk Reduction Factors:   Sense of responsibility to family and Religious beliefs about death  Continued Clinical Symptoms:  Medical Diagnoses  and Treatments/Surgeries  Cognitive Features That Contribute To Risk:  None    Suicide Risk:  Minimal: No identifiable suicidal ideation.  Patients presenting with no risk factors but with morbid ruminations; may be classified as minimal risk based on the severity of the depressive symptoms  Follow-up Information    [facility] Follow up on 11/27/2019.   Why: You are scheduled for an appointment on 11/27/19 at 8:30 am.  This is a virtual appointment.  Please have your insurance information and your discharge summary available for this appointment. Contact information: [address] [place] [phone]           Plan Of Care/Follow-up recommendations:  Activity:  full  Jacob Whisner, MD 11/22/2019, 7:28 AM

## 2020-02-07 LAB — HM DIABETES EYE EXAM

## 2020-03-03 ENCOUNTER — Other Ambulatory Visit: Payer: Self-pay

## 2020-03-03 ENCOUNTER — Inpatient Hospital Stay (HOSPITAL_COMMUNITY)
Admission: EM | Admit: 2020-03-03 | Discharge: 2020-03-05 | DRG: 638 | Disposition: A | Discharge status: Home / Self Care | Payer: Medicaid Other | Attending: Internal Medicine | Admitting: Internal Medicine

## 2020-03-03 ENCOUNTER — Encounter (HOSPITAL_COMMUNITY): Payer: Self-pay | Admitting: *Deleted

## 2020-03-03 DIAGNOSIS — Z20822 Contact with and (suspected) exposure to covid-19: Secondary | ICD-10-CM | POA: Diagnosis present

## 2020-03-03 DIAGNOSIS — E1110 Type 2 diabetes mellitus with ketoacidosis without coma: Principal | ICD-10-CM | POA: Diagnosis present

## 2020-03-03 DIAGNOSIS — E860 Dehydration: Secondary | ICD-10-CM | POA: Diagnosis present

## 2020-03-03 DIAGNOSIS — Z794 Long term (current) use of insulin: Secondary | ICD-10-CM

## 2020-03-03 DIAGNOSIS — E871 Hypo-osmolality and hyponatremia: Secondary | ICD-10-CM | POA: Diagnosis present

## 2020-03-03 DIAGNOSIS — F333 Major depressive disorder, recurrent, severe with psychotic symptoms: Secondary | ICD-10-CM | POA: Diagnosis present

## 2020-03-03 DIAGNOSIS — Z560 Unemployment, unspecified: Secondary | ICD-10-CM

## 2020-03-03 DIAGNOSIS — R1110 Vomiting, unspecified: Secondary | ICD-10-CM

## 2020-03-03 DIAGNOSIS — R112 Nausea with vomiting, unspecified: Secondary | ICD-10-CM

## 2020-03-03 DIAGNOSIS — N179 Acute kidney failure, unspecified: Secondary | ICD-10-CM

## 2020-03-03 DIAGNOSIS — Z79899 Other long term (current) drug therapy: Secondary | ICD-10-CM

## 2020-03-03 DIAGNOSIS — R7989 Other specified abnormal findings of blood chemistry: Secondary | ICD-10-CM

## 2020-03-03 DIAGNOSIS — F319 Bipolar disorder, unspecified: Secondary | ICD-10-CM | POA: Diagnosis present

## 2020-03-03 DIAGNOSIS — Z9114 Patient's other noncompliance with medication regimen: Secondary | ICD-10-CM

## 2020-03-03 DIAGNOSIS — T383X6A Underdosing of insulin and oral hypoglycemic [antidiabetic] drugs, initial encounter: Secondary | ICD-10-CM | POA: Diagnosis present

## 2020-03-03 DIAGNOSIS — D751 Secondary polycythemia: Secondary | ICD-10-CM

## 2020-03-03 DIAGNOSIS — E872 Acidosis: Secondary | ICD-10-CM | POA: Diagnosis present

## 2020-03-03 LAB — URINALYSIS, ROUTINE W REFLEX MICROSCOPIC
Bacteria, UA: NONE SEEN
Bilirubin Urine: NEGATIVE
Glucose, UA: >=500 mg/dL — AB
Ketones, ur: 80 mg/dL — AB
Leukocytes,Ua: NEGATIVE
Nitrite: NEGATIVE
Protein, ur: >=300 mg/dL — AB
Specific Gravity, Urine: 1.027 (ref 1.005–1.030)
pH: 5 (ref 5.0–8.0)

## 2020-03-03 LAB — LIPASE, BLOOD: Lipase: 20 U/L (ref 11–51)

## 2020-03-03 LAB — CBC
HCT: 55.5 % — ABNORMAL HIGH (ref 39.0–52.0)
Hemoglobin: 18.4 g/dL — ABNORMAL HIGH (ref 13.0–17.0)
MCH: 29 pg (ref 26.0–34.0)
MCHC: 33.2 g/dL (ref 30.0–36.0)
MCV: 87.4 fL (ref 80.0–100.0)
Platelets: 347 10*3/uL (ref 150–400)
RBC: 6.35 MIL/uL — ABNORMAL HIGH (ref 4.22–5.81)
RDW: 12.6 % (ref 11.5–15.5)
WBC: 10.5 10*3/uL (ref 4.0–10.5)
nRBC: 0 % (ref 0.0–0.2)

## 2020-03-03 LAB — CK: Total CK: 79 U/L (ref 49–397)

## 2020-03-03 LAB — CBG MONITORING, ED: Glucose-Capillary: 417 mg/dL — ABNORMAL HIGH (ref 70–99)

## 2020-03-03 MED ORDER — SODIUM CHLORIDE 0.9% FLUSH
3.0000 mL | Freq: Once | INTRAVENOUS | Status: AC
  Administered 2020-03-03: 3 mL via INTRAVENOUS

## 2020-03-03 MED ORDER — LACTATED RINGERS IV BOLUS
1000.0000 mL | Freq: Once | INTRAVENOUS | Status: AC
  Administered 2020-03-03: 1000 mL via INTRAVENOUS

## 2020-03-03 MED ORDER — ONDANSETRON HCL 4 MG/2ML IJ SOLN
4.0000 mg | Freq: Once | INTRAMUSCULAR | Status: AC
  Administered 2020-03-03: 4 mg via INTRAVENOUS
  Filled 2020-03-03: qty 2

## 2020-03-03 NOTE — ED Triage Notes (Signed)
Pt reports vomiting today, sugars have been in the 400's. Type 1 diabetic. Pt says he has not taken his insulin in about a week "because I have not been eating right"

## 2020-03-03 NOTE — ED Provider Notes (Signed)
[facility]-EMERGENCY DEPT Provider Note   CSN: [id number] Arrival date & time: 03/03/20  2027     History Chief Complaint  Patient presents with  . Emesis    Jacob [name] is a 29 y.o. male.  HPI 29 year old male presents with hyperglycemia and vomiting.  His glucose has been running in the 400s for the last couple days.  He has also started developing vomiting today.  No fever, abdominal pain, or urinary symptoms.  He states he has not been taking his insulin last couple days.  Does not give a real specific reason why.   Past Medical History:  Diagnosis Date  . Diabetes mellitus without complication [facility])     Patient Active Problem List   Diagnosis Date Noted  . MDD (major depressive disorder), recurrent, severe, with psychosis (HCC)     History reviewed. No pertinent surgical history.     No family history on file.  Social History   Tobacco Use  . Smoking status: Never Smoker  . Smokeless tobacco: Never Used  Substance Use Topics  . Alcohol use: No  . Drug use: Not Currently    Home Medications Prior to Admission medications   Medication Sig Start Date End Date Taking? Authorizing Provider  LEVEMIR FLEXTOUCH 100 UNIT/ML FlexPen Inject 15 Units into the skin at bedtime. 01/01/20  Yes [provider]  metFORMIN (GLUCOPHAGE) 500 MG tablet Take 500 mg by mouth 2 (two) times daily. 12/31/19  Yes [provider]  benztropine (COGENTIN) 0.5 MG tablet Take 1 tablet (0.5 mg total) by mouth 2 (two) times daily. Patient not taking: Reported on 03/03/2020 11/22/19   Malvin Johns, MD  carbamazepine (TEGRETOL) 100 MG chewable tablet 1 in am 2 at hs Patient not taking: Reported on 03/03/2020 11/22/19   Malvin Johns, MD  Insulin Glargine [facility] KWIKPEN) 100 UNIT/ML SOPN Inject 0.1 mLs (10 Units total) into the skin daily. Patient not taking: Reported on 03/03/2020 11/06/18   Shade Flood, MD  Insulin Pen Needle (PEN NEEDLES) 32G X 4 MM MISC 1  application by Does not apply route daily. 11/07/18   Shade Flood, MD  risperiDONE (RISPERDAL) 3 MG tablet Take 2 tablets (6 mg total) by mouth at bedtime. Patient not taking: Reported on 03/03/2020 11/22/19   Malvin Johns, MD  traZODone (DESYREL) 150 MG tablet Take 1 tablet (150 mg total) by mouth at bedtime as needed for sleep. Patient not taking: Reported on 03/03/2020 11/22/19   Malvin Johns, MD    Allergies    Patient has no known allergies.  Review of Systems   Review of Systems  Constitutional: Negative for fever.  Gastrointestinal: Positive for nausea and vomiting. Negative for abdominal pain and diarrhea.  Genitourinary: Positive for decreased urine volume. Negative for dysuria and frequency.  All other systems reviewed and are negative.   Physical Exam Updated Vital Signs BP (!) 139/96   Pulse (!) 103   Temp 98 F (36.7 C) (Oral)   Resp 18   Ht 5\' 10"  (1.778 m)   Wt 59 kg   SpO2 98%   BMI 18.65 kg/m   Physical Exam Vitals and nursing note reviewed.  Constitutional:      General: He is not in acute distress.    Appearance: He is well-developed. He is not ill-appearing or diaphoretic.  HENT:     Head: Normocephalic and atraumatic.     Right Ear: External ear normal.     Left Ear: External ear normal.  Nose: Nose normal.  Eyes:     General:        Right eye: No discharge.        Left eye: No discharge.  Cardiovascular:     Rate and Rhythm: Regular rhythm. Tachycardia present.     Heart sounds: Normal heart sounds.     Comments: HR low 100s Pulmonary:     Effort: Pulmonary effort is normal.     Breath sounds: Normal breath sounds.  Abdominal:     Palpations: Abdomen is soft.     Tenderness: There is no abdominal tenderness.  Musculoskeletal:     Cervical back: Neck supple.  Skin:    General: Skin is warm and dry.  Neurological:     Mental Status: He is alert.  Psychiatric:        Mood and Affect: Mood is not anxious.     ED Results / Procedures  / Treatments   Labs (all labs ordered are listed, but only abnormal results are displayed) Labs Reviewed  CBC - Abnormal; Notable for the following components:      Result Value   RBC 6.35 (*)    Hemoglobin 18.4 (*)    HCT 55.5 (*)    All other components within normal limits  URINALYSIS, ROUTINE W REFLEX MICROSCOPIC - Abnormal; Notable for the following components:   Glucose, UA >=500 (*)    Hgb urine dipstick MODERATE (*)    Ketones, ur 80 (*)    Protein, ur >=300 (*)    All other components within normal limits  COMPREHENSIVE METABOLIC PANEL - Abnormal; Notable for the following components:   Sodium 133 (*)    Chloride 97 (*)    CO2 8 (*)    Glucose, Bld 423 (*)    Creatinine, Ser 1.37 (*)    Total Protein 9.2 (*)    Albumin 5.3 (*)    ALT 51 (*)    Total Bilirubin 1.7 (*)    Anion gap 28 (*)    All other components within normal limits  CBG MONITORING, ED - Abnormal; Notable for the following components:   Glucose-Capillary 417 (*)    All other components within normal limits  RESPIRATORY PANEL BY RT PCR (FLU A&B, COVID)  LIPASE, BLOOD  CK  BLOOD GAS, VENOUS  BASIC METABOLIC PANEL  BASIC METABOLIC PANEL  BASIC METABOLIC PANEL  BASIC METABOLIC PANEL  BASIC METABOLIC PANEL  BETA-HYDROXYBUTYRIC ACID  BETA-HYDROXYBUTYRIC ACID  BETA-HYDROXYBUTYRIC ACID  BLOOD GAS, VENOUS  CBG MONITORING, ED    EKG None  Radiology No results found.  Procedures .Critical Care Performed by: Pricilla Loveless, MD Authorized by: Pricilla Loveless, MD   Critical care provider statement:    Critical care time (minutes):  35   Critical care time was exclusive of:  Separately billable procedures and treating other patients   Critical care was necessary to treat or prevent imminent or life-threatening deterioration of the following conditions:  Endocrine crisis   Critical care was time spent personally by me on the following activities:  Discussions with consultants, evaluation of  patient's response to treatment, examination of patient, ordering and performing treatments and interventions, ordering and review of laboratory studies, ordering and review of radiographic studies, pulse oximetry, re-evaluation of patient's condition, obtaining history from patient or surrogate and review of old charts   (including critical care time)  Medications Ordered in ED Medications  insulin regular, human (MYXREDLIN) 100 units/ 100 mL infusion (has no administration in time range)  0.9 %  sodium chloride infusion (has no administration in time range)  dextrose 5 %-0.45 % sodium chloride infusion (has no administration in time range)  dextrose 50 % solution 0-50 mL (has no administration in time range)  potassium chloride 10 mEq in 100 mL IVPB (has no administration in time range)  sodium chloride flush (NS) 0.9 % injection 3 mL (3 mLs Intravenous Given 03/03/20 2138)  lactated ringers bolus 1,000 mL (1,000 mLs Intravenous New Bag/Given 03/03/20 2224)  lactated ringers bolus 1,000 mL (1,000 mLs Intravenous New Bag/Given 03/03/20 2223)  ondansetron (ZOFRAN) injection 4 mg (4 mg Intravenous Given 03/03/20 2224)    ED Course  I have reviewed the triage vital signs and the nursing notes.  Pertinent labs & imaging results that were available during my care of the patient were reviewed by me and considered in my medical decision making (see chart for details).    MDM Rules/Calculators/A&P                      Labs have been reviewed and are most pertinent for anion gap metabolic acidosis.  Bicarbonate is 8 with a glucose over 400.  His potassium is 4.9.  He was given 2 L IV fluid and will now be started on DKA protocol.  Mild tachycardia but is otherwise hemodynamically stable.  This seems to be from noncompliance rather than an acute stressor otherwise such as infection.  He also has ketones in his urine.  D/w Dr. Marlowe Sax for admission. Final Clinical Impression(s) / ED Diagnoses Final  diagnoses:  Diabetic ketoacidosis without coma associated with type 2 diabetes mellitus [facility])    Rx / DC Orders ED Discharge Orders    None       Sherwood Gambler, MD 03/04/20 [phone]

## 2020-03-03 NOTE — ED Notes (Signed)
Pt says he is unable to urinate at the moment

## 2020-03-04 ENCOUNTER — Encounter (HOSPITAL_COMMUNITY): Payer: Self-pay | Admitting: Internal Medicine

## 2020-03-04 ENCOUNTER — Inpatient Hospital Stay (HOSPITAL_COMMUNITY): Payer: Medicaid Other

## 2020-03-04 DIAGNOSIS — F319 Bipolar disorder, unspecified: Secondary | ICD-10-CM | POA: Diagnosis present

## 2020-03-04 DIAGNOSIS — E1110 Type 2 diabetes mellitus with ketoacidosis without coma: Secondary | ICD-10-CM | POA: Diagnosis not present

## 2020-03-04 DIAGNOSIS — Z794 Long term (current) use of insulin: Secondary | ICD-10-CM | POA: Diagnosis not present

## 2020-03-04 DIAGNOSIS — N179 Acute kidney failure, unspecified: Secondary | ICD-10-CM

## 2020-03-04 DIAGNOSIS — T383X6A Underdosing of insulin and oral hypoglycemic [antidiabetic] drugs, initial encounter: Secondary | ICD-10-CM | POA: Diagnosis present

## 2020-03-04 DIAGNOSIS — D751 Secondary polycythemia: Secondary | ICD-10-CM

## 2020-03-04 DIAGNOSIS — E871 Hypo-osmolality and hyponatremia: Secondary | ICD-10-CM | POA: Diagnosis present

## 2020-03-04 DIAGNOSIS — R112 Nausea with vomiting, unspecified: Secondary | ICD-10-CM

## 2020-03-04 DIAGNOSIS — Z20822 Contact with and (suspected) exposure to covid-19: Secondary | ICD-10-CM | POA: Diagnosis present

## 2020-03-04 DIAGNOSIS — R1110 Vomiting, unspecified: Secondary | ICD-10-CM

## 2020-03-04 DIAGNOSIS — Z79899 Other long term (current) drug therapy: Secondary | ICD-10-CM | POA: Diagnosis not present

## 2020-03-04 DIAGNOSIS — E860 Dehydration: Secondary | ICD-10-CM | POA: Diagnosis present

## 2020-03-04 DIAGNOSIS — E872 Acidosis: Secondary | ICD-10-CM | POA: Diagnosis present

## 2020-03-04 DIAGNOSIS — Z560 Unemployment, unspecified: Secondary | ICD-10-CM | POA: Diagnosis not present

## 2020-03-04 DIAGNOSIS — E1010 Type 1 diabetes mellitus with ketoacidosis without coma: Secondary | ICD-10-CM | POA: Diagnosis not present

## 2020-03-04 DIAGNOSIS — F333 Major depressive disorder, recurrent, severe with psychotic symptoms: Secondary | ICD-10-CM | POA: Diagnosis not present

## 2020-03-04 DIAGNOSIS — Z9114 Patient's other noncompliance with medication regimen: Secondary | ICD-10-CM | POA: Diagnosis not present

## 2020-03-04 LAB — COMPREHENSIVE METABOLIC PANEL
ALT: 51 U/L — ABNORMAL HIGH (ref 0–44)
AST: 24 U/L (ref 15–41)
Albumin: 5.3 g/dL — ABNORMAL HIGH (ref 3.5–5.0)
Alkaline Phosphatase: 115 U/L (ref 38–126)
Anion gap: 28 — ABNORMAL HIGH (ref 5–15)
BUN: 16 mg/dL (ref 6–20)
CO2: 8 mmol/L — ABNORMAL LOW (ref 22–32)
Calcium: 9.7 mg/dL (ref 8.9–10.3)
Chloride: 97 mmol/L — ABNORMAL LOW (ref 98–111)
Creatinine, Ser: 1.37 mg/dL — ABNORMAL HIGH (ref 0.61–1.24)
GFR calc Af Amer: >60 mL/min (ref >60)
GFR calc non Af Amer: >60 mL/min (ref >60)
Glucose, Bld: 423 mg/dL — ABNORMAL HIGH (ref 70–99)
Potassium: 4.9 mmol/L (ref 3.5–5.1)
Sodium: 133 mmol/L — ABNORMAL LOW (ref 135–145)
Total Bilirubin: 1.7 mg/dL — ABNORMAL HIGH (ref 0.3–1.2)
Total Protein: 9.2 g/dL — ABNORMAL HIGH (ref 6.5–8.1)

## 2020-03-04 LAB — BASIC METABOLIC PANEL
Anion gap: 23 — ABNORMAL HIGH (ref 5–15)
Anion gap: 14 (ref 5–15)
Anion gap: 9 (ref 5–15)
Anion gap: 11 (ref 5–15)
Anion gap: 10 (ref 5–15)
BUN: 14 mg/dL (ref 6–20)
BUN: 11 mg/dL (ref 6–20)
BUN: 11 mg/dL (ref 6–20)
BUN: 10 mg/dL (ref 6–20)
BUN: 10 mg/dL (ref 6–20)
CO2: 9 mmol/L — ABNORMAL LOW (ref 22–32)
CO2: 19 mmol/L — ABNORMAL LOW (ref 22–32)
CO2: 23 mmol/L (ref 22–32)
CO2: 24 mmol/L (ref 22–32)
CO2: 24 mmol/L (ref 22–32)
Calcium: 9.3 mg/dL (ref 8.9–10.3)
Calcium: 8.6 mg/dL — ABNORMAL LOW (ref 8.9–10.3)
Calcium: 8.6 mg/dL — ABNORMAL LOW (ref 8.9–10.3)
Calcium: 8.6 mg/dL — ABNORMAL LOW (ref 8.9–10.3)
Calcium: 8.7 mg/dL — ABNORMAL LOW (ref 8.9–10.3)
Chloride: 103 mmol/L (ref 98–111)
Chloride: 103 mmol/L (ref 98–111)
Chloride: 104 mmol/L (ref 98–111)
Chloride: 102 mmol/L (ref 98–111)
Chloride: 103 mmol/L (ref 98–111)
Creatinine, Ser: 1.19 mg/dL (ref 0.61–1.24)
Creatinine, Ser: 0.9 mg/dL (ref 0.61–1.24)
Creatinine, Ser: 0.84 mg/dL (ref 0.61–1.24)
Creatinine, Ser: 0.73 mg/dL (ref 0.61–1.24)
Creatinine, Ser: 0.77 mg/dL (ref 0.61–1.24)
GFR calc Af Amer: >60 mL/min (ref >60)
GFR calc Af Amer: >60 mL/min (ref >60)
GFR calc Af Amer: >60 mL/min (ref >60)
GFR calc Af Amer: >60 mL/min (ref >60)
GFR calc Af Amer: >60 mL/min (ref >60)
GFR calc non Af Amer: >60 mL/min (ref >60)
GFR calc non Af Amer: >60 mL/min (ref >60)
GFR calc non Af Amer: >60 mL/min (ref >60)
GFR calc non Af Amer: >60 mL/min (ref >60)
GFR calc non Af Amer: >60 mL/min (ref >60)
Glucose, Bld: 415 mg/dL — ABNORMAL HIGH (ref 70–99)
Glucose, Bld: 172 mg/dL — ABNORMAL HIGH (ref 70–99)
Glucose, Bld: 182 mg/dL — ABNORMAL HIGH (ref 70–99)
Glucose, Bld: 182 mg/dL — ABNORMAL HIGH (ref 70–99)
Glucose, Bld: 213 mg/dL — ABNORMAL HIGH (ref 70–99)
Potassium: 5.1 mmol/L (ref 3.5–5.1)
Potassium: 4 mmol/L (ref 3.5–5.1)
Potassium: 3.8 mmol/L (ref 3.5–5.1)
Potassium: 3.5 mmol/L (ref 3.5–5.1)
Potassium: 3.1 mmol/L — ABNORMAL LOW (ref 3.5–5.1)
Sodium: 135 mmol/L (ref 135–145)
Sodium: 136 mmol/L (ref 135–145)
Sodium: 136 mmol/L (ref 135–145)
Sodium: 137 mmol/L (ref 135–145)
Sodium: 137 mmol/L (ref 135–145)

## 2020-03-04 LAB — CBC
HCT: 44.2 % (ref 39.0–52.0)
Hemoglobin: 15.2 g/dL (ref 13.0–17.0)
MCH: 28.8 pg (ref 26.0–34.0)
MCHC: 34.4 g/dL (ref 30.0–36.0)
MCV: 83.7 fL (ref 80.0–100.0)
Platelets: 251 10*3/uL (ref 150–400)
RBC: 5.28 MIL/uL (ref 4.22–5.81)
RDW: 12.6 % (ref 11.5–15.5)
WBC: 8.2 10*3/uL (ref 4.0–10.5)
nRBC: 0 % (ref 0.0–0.2)

## 2020-03-04 LAB — BETA-HYDROXYBUTYRIC ACID
Beta-Hydroxybutyric Acid: >8 mmol/L — ABNORMAL HIGH (ref 0.05–0.27)
Beta-Hydroxybutyric Acid: 4.65 mmol/L — ABNORMAL HIGH (ref 0.05–0.27)
Beta-Hydroxybutyric Acid: 3.6 mmol/L — ABNORMAL HIGH (ref 0.05–0.27)
Beta-Hydroxybutyric Acid: 4.05 mmol/L — ABNORMAL HIGH (ref 0.05–0.27)
Beta-Hydroxybutyric Acid: 2.95 mmol/L — ABNORMAL HIGH (ref 0.05–0.27)

## 2020-03-04 LAB — BLOOD GAS, ARTERIAL
Acid-base deficit: 16.6 mmol/L — ABNORMAL HIGH (ref 0.0–2.0)
Bicarbonate: 9.6 mmol/L — ABNORMAL LOW (ref 20.0–28.0)
FIO2: 21
O2 Saturation: 98.6 %
Patient temperature: 98.6
pCO2 arterial: 23.6 mmHg — ABNORMAL LOW (ref 32.0–48.0)
pH, Arterial: 7.232 — ABNORMAL LOW (ref 7.350–7.450)
pO2, Arterial: 134 mmHg — ABNORMAL HIGH (ref 83.0–108.0)

## 2020-03-04 LAB — CBG MONITORING, ED
Glucose-Capillary: 445 mg/dL — ABNORMAL HIGH (ref 70–99)
Glucose-Capillary: 250 mg/dL — ABNORMAL HIGH (ref 70–99)
Glucose-Capillary: 118 mg/dL — ABNORMAL HIGH (ref 70–99)
Glucose-Capillary: 154 mg/dL — ABNORMAL HIGH (ref 70–99)
Glucose-Capillary: 228 mg/dL — ABNORMAL HIGH (ref 70–99)

## 2020-03-04 LAB — HEMOGLOBIN A1C
Hgb A1c MFr Bld: 15 % — ABNORMAL HIGH (ref 4.8–5.6)
Mean Plasma Glucose: 383.8 mg/dL

## 2020-03-04 LAB — GLUCOSE, CAPILLARY
Glucose-Capillary: 187 mg/dL — ABNORMAL HIGH (ref 70–99)
Glucose-Capillary: 173 mg/dL — ABNORMAL HIGH (ref 70–99)

## 2020-03-04 LAB — HIV ANTIBODY (ROUTINE TESTING W REFLEX): HIV Screen 4th Generation wRfx: NONREACTIVE

## 2020-03-04 LAB — RESPIRATORY PANEL BY RT PCR (FLU A&B, COVID)
Influenza A by PCR: NEGATIVE
Influenza B by PCR: NEGATIVE
SARS Coronavirus 2 by RT PCR: NEGATIVE

## 2020-03-04 MED ORDER — INSULIN ASPART 100 UNIT/ML ~~LOC~~ SOLN: 0.0000 [IU] | Freq: Every day | SUBCUTANEOUS | Status: DC

## 2020-03-04 MED ORDER — ONDANSETRON HCL 4 MG/2ML IJ SOLN: 4.0000 mg | Freq: Four times a day (QID) | INTRAMUSCULAR | Status: DC | PRN

## 2020-03-04 MED ORDER — POTASSIUM CHLORIDE 10 MEQ/100ML IV SOLN
10.0000 meq | INTRAVENOUS | Status: AC
  Administered 2020-03-04 (×2): 10 meq via INTRAVENOUS
  Filled 2020-03-04 (×2): qty 100

## 2020-03-04 MED ORDER — INSULIN REGULAR(HUMAN) IN NACL 100-0.9 UT/100ML-% IV SOLN
INTRAVENOUS | Status: DC
  Administered 2020-03-04: 1.3 [IU]/h via INTRAVENOUS
  Filled 2020-03-04: qty 100

## 2020-03-04 MED ORDER — SODIUM CHLORIDE 0.9 % IV SOLN: INTRAVENOUS | Status: DC

## 2020-03-04 MED ORDER — STERILE WATER FOR INJECTION IV SOLN
INTRAVENOUS | Status: DC
  Filled 2020-03-04 (×2): qty 850

## 2020-03-04 MED ORDER — INSULIN REGULAR(HUMAN) IN NACL 100-0.9 UT/100ML-% IV SOLN
INTRAVENOUS | Status: DC
  Administered 2020-03-04: 7 [IU]/h via INTRAVENOUS
  Filled 2020-03-04: qty 100

## 2020-03-04 MED ORDER — POTASSIUM CHLORIDE 10 MEQ/100ML IV SOLN
10.0000 meq | INTRAVENOUS | Status: AC
  Administered 2020-03-04 (×3): 10 meq via INTRAVENOUS
  Filled 2020-03-04 (×3): qty 100

## 2020-03-04 MED ORDER — DEXTROSE 50 % IV SOLN: 0.0000 mL | INTRAVENOUS | Status: DC | PRN

## 2020-03-04 MED ORDER — DEXTROSE-NACL 5-0.45 % IV SOLN: INTRAVENOUS | Status: DC

## 2020-03-04 MED ORDER — INSULIN GLARGINE 100 UNIT/ML ~~LOC~~ SOLN: 12.0000 [IU] | Freq: Every day | SUBCUTANEOUS | Status: DC

## 2020-03-04 MED ORDER — INSULIN GLARGINE 100 UNIT/ML ~~LOC~~ SOLN
15.0000 [IU] | Freq: Every day | SUBCUTANEOUS | Status: DC
  Administered 2020-03-04 – 2020-03-05 (×2): 15 [IU] via SUBCUTANEOUS
  Filled 2020-03-04 (×2): qty 0.15

## 2020-03-04 MED ORDER — INSULIN ASPART 100 UNIT/ML ~~LOC~~ SOLN
0.0000 [IU] | Freq: Three times a day (TID) | SUBCUTANEOUS | Status: DC
  Administered 2020-03-04: 2 [IU] via SUBCUTANEOUS
  Administered 2020-03-05: 3 [IU] via SUBCUTANEOUS
  Administered 2020-03-05: 5 [IU] via SUBCUTANEOUS
  Filled 2020-03-04: qty 0.09

## 2020-03-04 MED ORDER — ENOXAPARIN SODIUM 40 MG/0.4ML ~~LOC~~ SOLN
40.0000 mg | Freq: Every day | SUBCUTANEOUS | Status: DC
  Administered 2020-03-04: 40 mg via SUBCUTANEOUS
  Filled 2020-03-04: qty 0.4

## 2020-03-04 MED ORDER — INSULIN ASPART 100 UNIT/ML ~~LOC~~ SOLN
3.0000 [IU] | Freq: Three times a day (TID) | SUBCUTANEOUS | Status: DC
  Administered 2020-03-05 (×2): 3 [IU] via SUBCUTANEOUS
  Filled 2020-03-04: qty 0.03

## 2020-03-04 NOTE — Progress Notes (Signed)
Inpatient Diabetes Program Recommendations  AACE/ADA: New Consensus Statement on Inpatient Glycemic Control (2015)  Target Ranges:  Prepandial:   less than 140 mg/dL      Peak postprandial:   less than 180 mg/dL (1-2 hours)      Critically ill patients:  140 - 180 mg/dL   Lab Results  Component Value Date   GLUCAP 154 (H) 03/04/2020   HGBA1C 15.0 (H) 03/04/2020    Review of Glycemic Control Results for Jacob [name], Jacob [name] (MRN [id number]) as of 03/04/2020 12:18  Ref. Range 03/04/2020 02:26 03/04/2020 04:08 03/04/2020 06:27  Glucose-Capillary Latest Ref Range: 70 - 99 mg/dL 116 (H) 435 (H) 391 (H)   Diabetes history: DM  Outpatient Diabetes medications:  Levemir 15 units q HS, Metformin 500 mg bid Current orders for Inpatient glycemic control:  IV insulin/DKA order set  Inpatient Diabetes Program Recommendations:    Upon transition off insulin drip, consider Lantus 15 units 2 hours prior to d/c of insulin drip.  Also consider Novolog sensitive q 4 hours with 3 units meal coverage once patient is ready to eat.  It appears that patient has had several admissions over the past year for both physical and mental diagnoses.  Need to make sure that he has resources at d/c for insulin, etc.   Thanks  Beryl Meager, RN, BC-ADM Inpatient Diabetes Coordinator Pager [phone] (8a-5p)

## 2020-03-04 NOTE — Progress Notes (Signed)
Indiana Feigel is a 29 y.o. male with a history of poorly controlled insulin-dependent diabetes who was admitted early this morning by Dr. Loney Loh for DKA after being noncompliant with his insulin.  Currently, patient is complaining of being hungry but states that his nausea and vomiting and other symptoms have resolved. Currently denies any complaints. States that he is not compliant with his insulin but does not have a specific reason other than the fact that he does not seem to really want to use it.   PE: General: Awake and alert, no acute distress  Heart: S1 and S2 auscultated, no murmurs  Lungs: Clear to auscultation bilaterally, no wheeze    A/P  1. DKA secondary to medication noncompliance with poorly controlled diabetes a. HA1C 15.0 b. unfortunately there has been difficulty obtaining routine and scheduled labs in the ED. Even still, Per last labs his anion gap had closed and he has since been transitioned to basal bolus insulin however I was notified that his insulin drip was discontinued about 45 minutes prior to receiving Lantus, as opposed to continuing drip and subcu insulin at the same time for 2 hours duration. I had a discussion with bedside nurse and plan is to get stat BMP and beta hydroxybutyrate labs and restart insulin drip pending lab work c. Continues to be n.p.o. pending lab work d. IV fluids and electrolyte replacement per DKA protocol e. Patient can go to Med telemetry bed  2. AKI, resolved 3. Hyponatremia, resolved 4. Hyperkalemia, resolved 5. Emesis, resolved 6. Major depression a. Psychiatry consulted on admission    Jae Dire, DO [facility] Pager [phone]

## 2020-03-04 NOTE — H&P (Signed)
History and Physical    Jacob [name] NUU:[id number] DOB: 12/03/1990 DOA: 03/03/2020  PCP: System, Pcp Not In Patient coming from: Home  Chief Complaint: High blood glucose, vomiting  HPI: Jacob [name] is a 29 y.o. male with medical history significant of uncontrolled insulin-dependent diabetes (A1c 9.4 on 11/16/2019), MDD presenting with complaints of high blood glucose and vomiting.  Patient reports noncompliance with his home medications.  He thinks he has taken insulin about 3 times in the past 2 weeks.  States his blood glucose has been high in the 400s for the past 2 weeks.  Today he started vomiting and has not been able to tolerate any p.o. intake.  Denies cough, shortness of breath, chest pain, abdominal pain, or diarrhea.  States he has not taken his medications for depression for the past 6 months.  Denies suicidal ideation.  No additional history could be obtained from the patient.  ED Course: Afebrile.  Tachycardic on arrival.  No significant leukocytosis on labs.  Hemoglobin 18.4, previously in the 14-15 range.  Sodium 133.  Bicarb 8, anion gap 28.  Blood glucose 423.  UA with ketones.  BUN 16, creatinine 1.3.  Baseline creatinine 0.9-1.0.  Lipase normal.  T bili 1.7, mildly elevated on previous labs as well.  AST and alk phos normal.  ALT mildly elevated at 51.  CK normal.  SARS-CoV-2 PCR test pending.  Beta hydroxybutyric acid level pending. No imaging done. Patient received Zofran, 2 L LR boluses, and was started on insulin per DKA protocol.  Potassium supplementation ordered.  Review of Systems:  All systems reviewed and apart from history of presenting illness, are negative.  Past Medical History:  Diagnosis Date  . Diabetes mellitus without complication ([place])     History reviewed. No pertinent surgical history.   reports that he has never smoked. He has never used smokeless tobacco. He reports previous drug use. He reports that he does not drink alcohol.  No Known  Allergies  History reviewed. No pertinent family history.  Prior to Admission medications   Medication Sig Start Date End Date Taking? Authorizing Provider  LEVEMIR FLEXTOUCH 100 UNIT/ML FlexPen Inject 15 Units into the skin at bedtime. 01/01/20  Yes [provider]  metFORMIN (GLUCOPHAGE) 500 MG tablet Take 500 mg by mouth 2 (two) times daily. 12/31/19  Yes [provider]  benztropine (COGENTIN) 0.5 MG tablet Take 1 tablet (0.5 mg total) by mouth 2 (two) times daily. Patient not taking: Reported on 03/03/2020 11/22/19   Johnn Hai, MD  carbamazepine (TEGRETOL) 100 MG chewable tablet 1 in am 2 at hs Patient not taking: Reported on 03/03/2020 11/22/19   Johnn Hai, MD  Insulin Glargine [facility] KWIKPEN) 100 UNIT/ML SOPN Inject 0.1 mLs (10 Units total) into the skin daily. Patient not taking: Reported on 03/03/2020 11/06/18   Wendie Agreste, MD  Insulin Pen Needle (PEN NEEDLES) 32G X 4 MM MISC 1 application by Does not apply route daily. 11/07/18   Wendie Agreste, MD  risperiDONE (RISPERDAL) 3 MG tablet Take 2 tablets (6 mg total) by mouth at bedtime. Patient not taking: Reported on 03/03/2020 11/22/19   Johnn Hai, MD  traZODone (DESYREL) 150 MG tablet Take 1 tablet (150 mg total) by mouth at bedtime as needed for sleep. Patient not taking: Reported on 03/03/2020 11/22/19   Johnn Hai, MD    Physical Exam: Vitals:   03/04/20 0000 03/04/20 0030 03/04/20 0048 03/04/20 0100  BP: (!) 139/96 131/88 (!) 143/81 (!) 152/86  Pulse: (!) 103 95 90 95  Resp:   14 14  Temp:      TempSrc:      SpO2: 98% 96% 98% 98%  Weight:      Height:        Physical Exam  Constitutional: He is oriented to person, place, and time. No distress.  Very dry mucous membranes  HENT:  Head: Normocephalic.  Eyes: Right eye exhibits no discharge. Left eye exhibits no discharge.  Cardiovascular: Normal rate, regular rhythm and intact distal pulses.  Pulmonary/Chest: Effort normal and breath sounds  normal. No respiratory distress. He has no wheezes. He has no rales.  Abdominal: Soft. Bowel sounds are normal. He exhibits no distension. There is no abdominal tenderness. There is no guarding.  Musculoskeletal:        General: No edema.     Cervical back: Neck supple.  Neurological: He is alert and oriented to person, place, and time.  Skin: Skin is warm and dry. He is not diaphoretic.    Labs on Admission: I have personally reviewed following labs and imaging studies  CBC: Recent Labs  Lab 03/03/20 2135  WBC 10.5  HGB 18.4*  HCT 55.5*  MCV 87.4  PLT 176   Basic Metabolic Panel: Recent Labs  Lab 03/03/20 2311  NA 133*  K 4.9  CL 97*  CO2 8*  GLUCOSE 423*  BUN 16  CREATININE 1.37*  CALCIUM 9.7   GFR: Estimated Creatinine Clearance: 67 mL/min (A) (by C-G formula based on SCr of 1.37 mg/dL (H)). Liver Function Tests: Recent Labs  Lab 03/03/20 2311  AST 24  ALT 51*  ALKPHOS 115  BILITOT 1.7*  PROT 9.2*  ALBUMIN 5.3*   Recent Labs  Lab 03/03/20 2135  LIPASE 20   No results for input(s): AMMONIA in the last 168 hours. Coagulation Profile: No results for input(s): INR, PROTIME in the last 168 hours. Cardiac Enzymes: Recent Labs  Lab 03/03/20 2311  CKTOTAL 79   BNP (last 3 results) No results for input(s): PROBNP in the last 8760 hours. HbA1C: No results for input(s): HGBA1C in the last 72 hours. CBG: Recent Labs  Lab 03/03/20 2047 03/04/20 0054  GLUCAP 417* 445*   Lipid Profile: No results for input(s): CHOL, HDL, LDLCALC, TRIG, CHOLHDL, LDLDIRECT in the last 72 hours. Thyroid Function Tests: No results for input(s): TSH, T4TOTAL, FREET4, T3FREE, THYROIDAB in the last 72 hours. Anemia Panel: No results for input(s): VITAMINB12, FOLATE, FERRITIN, TIBC, IRON, RETICCTPCT in the last 72 hours. Urine analysis:    Component Value Date/Time   COLORURINE YELLOW 03/03/2020 2247   APPEARANCEUR CLEAR 03/03/2020 2247   LABSPEC 1.027 03/03/2020 2247    PHURINE 5.0 03/03/2020 2247   GLUCOSEU >=500 (A) 03/03/2020 2247   HGBUR MODERATE (A) 03/03/2020 2247   BILIRUBINUR NEGATIVE 03/03/2020 2247   BILIRUBINUR negative 11/06/2018 1449   KETONESUR 80 (A) 03/03/2020 2247   PROTEINUR >=300 (A) 03/03/2020 2247   UROBILINOGEN 0.2 11/06/2018 1449   NITRITE NEGATIVE 03/03/2020 2247   LEUKOCYTESUR NEGATIVE 03/03/2020 2247    Radiological Exams on Admission: No results found.  Assessment/Plan Principal Problem:   DKA (diabetic ketoacidoses) (HCC) Active Problems:   MDD (major depressive disorder), recurrent, severe, with psychosis ([place])   Polycythemia   AKI (acute kidney injury) ([place])   Emesis   Severe DKA in the setting of uncontrolled insulin-dependent diabetes: Suspect related to medication noncompliance. A1c 9.4 on 11/16/2019. No fever or significant leukocytosis to suggest infection as a  precipitating factor. Bicarb 8, anion gap 28.  Blood glucose 423.  UA with ketones.  Potassium 4.9.  Patient is AAO x4. -Patient received 2 L fluid boluses.  Continue NS'@150'  cc/hr.  When CBG less than 250, switch to D5-1 half normal saline '@75'  cc/hr. Start bicarbonate infusion '@125'  cc/hr. Continue insulin infusion and frequent CBG checks per Endo tool.  Monitor beta hydroxybutyric acid level every 8 hours.  Monitor BMP every 4 hours.  Order stat ABG to check pH.  Check A1c level.  Consult diabetes coordinator.  Keep n.p.o. at this time.  Initiate subcutaneous insulin and diet upon resolution of DKA.  Continue IV insulin for an additional 2 hours.  Polycythemia: Hemoglobin 18.4, previously in the 14-15 range. ?Hemoconcentration.  Repeat CBC in a.m. to confirm  Mild hyponatremia: Sodium 133.  Continue IV fluid hydration with normal saline and monitor sodium level.  Mild AKI: Creatinine 1.3, baseline 0.9-1.0.  Likely prerenal secondary to dehydration.  Continue IV fluid hydration and monitor renal function.  Emesis: Likely related to DKA.    Lipase normal.  T  bili 1.7, mildly elevated on previous labs as well.  AST and alk phos normal.  ALT mildly elevated at 51.  Abdominal exam benign.  Will order right upper quadrant ultrasound.  Antiemetic as needed.  MDD: Patient states he has not taken his medications for the past 6 months.  Denies suicidal ideation.  Psychiatry consult has been placed.  DVT prophylaxis: Lovenox Code Status: Full code Family Communication: No family available at this time. Disposition Plan: Status is: Inpatient  Remains inpatient appropriate because:Hemodynamically unstable, Persistent severe electrolyte disturbances and IV treatments appropriate due to intensity of illness or inability to take PO   Dispo: The patient is from: Home              Anticipated d/c is to: Home              Anticipated d/c date is: 2 days              Patient currently is not medically stable to d/c.  The medical decision making on this patient was of high complexity and the patient is at high risk for clinical deterioration, therefore this is a level 3 visit.  Shela Leff MD [facility]  If 7PM-7AM, please contact night-coverage [email or web address]  03/04/2020, 1:31 AM

## 2020-03-04 NOTE — ED Notes (Addendum)
Stopped Myxredlin 100/unit/100 mL infusion at 13:00. Gave lantus 15 units at 14:00. Restarted Myxredlin 100/unit/100 mL infusion at 14:25. Confirmed with attending Myxredlin will stop at 14:25. Confirmed with 4E charge, pt can't transfer to telemetry until insulin infusion stops at 16:25.

## 2020-03-05 DIAGNOSIS — E1010 Type 1 diabetes mellitus with ketoacidosis without coma: Secondary | ICD-10-CM

## 2020-03-05 DIAGNOSIS — F333 Major depressive disorder, recurrent, severe with psychotic symptoms: Secondary | ICD-10-CM

## 2020-03-05 LAB — BASIC METABOLIC PANEL
Anion gap: 8 (ref 5–15)
BUN: 10 mg/dL (ref 6–20)
CO2: 25 mmol/L (ref 22–32)
Calcium: 8.4 mg/dL — ABNORMAL LOW (ref 8.9–10.3)
Chloride: 102 mmol/L (ref 98–111)
Creatinine, Ser: 0.77 mg/dL (ref 0.61–1.24)
GFR calc Af Amer: >60 mL/min (ref >60)
GFR calc non Af Amer: >60 mL/min (ref >60)
Glucose, Bld: 209 mg/dL — ABNORMAL HIGH (ref 70–99)
Potassium: 3.5 mmol/L (ref 3.5–5.1)
Sodium: 135 mmol/L (ref 135–145)

## 2020-03-05 LAB — CBC
HCT: 38.8 % — ABNORMAL LOW (ref 39.0–52.0)
Hemoglobin: 13.7 g/dL (ref 13.0–17.0)
MCH: 29.6 pg (ref 26.0–34.0)
MCHC: 35.3 g/dL (ref 30.0–36.0)
MCV: 83.8 fL (ref 80.0–100.0)
Platelets: 231 10*3/uL (ref 150–400)
RBC: 4.63 MIL/uL (ref 4.22–5.81)
RDW: 12.6 % (ref 11.5–15.5)
WBC: 4.7 10*3/uL (ref 4.0–10.5)
nRBC: 0 % (ref 0.0–0.2)

## 2020-03-05 LAB — GLUCOSE, CAPILLARY
Glucose-Capillary: 245 mg/dL — ABNORMAL HIGH (ref 70–99)
Glucose-Capillary: 299 mg/dL — ABNORMAL HIGH (ref 70–99)

## 2020-03-05 LAB — BETA-HYDROXYBUTYRIC ACID
Beta-Hydroxybutyric Acid: 1.78 mmol/L — ABNORMAL HIGH (ref 0.05–0.27)
Beta-Hydroxybutyric Acid: 1.49 mmol/L — ABNORMAL HIGH (ref 0.05–0.27)
Beta-Hydroxybutyric Acid: 0.24 mmol/L (ref 0.05–0.27)
Beta-Hydroxybutyric Acid: 0.11 mmol/L (ref 0.05–0.27)

## 2020-03-05 LAB — MAGNESIUM: Magnesium: 1.8 mg/dL (ref 1.7–2.4)

## 2020-03-05 NOTE — Progress Notes (Signed)
Inpatient Diabetes Program Recommendations  AACE/ADA: New Consensus Statement on Inpatient Glycemic Control (2015)  Target Ranges:  Prepandial:   less than 140 mg/dL      Peak postprandial:   less than 180 mg/dL (1-2 hours)      Critically ill patients:  140 - 180 mg/dL   Lab Results  Component Value Date   GLUCAP 299 (H) 03/05/2020   HGBA1C 15.0 (H) 03/04/2020    Review of Glycemic Control  Long discussion with pt and Grandmother regarding HgbA1C of 15% and poor glycemic control at home. Pt has been drinking a lot of juice and eating sweets. Has not been checking blood sugars although he has meter and supplies. Takes Lantus and metformin, but not everyday. Explained importance of monitoring blood sugars so he will know what they are, and be able to call MD for titration of insulin.   Pt needs rapid-acting insulin, along with his basal insulin, although it is doubtful he will take it. States he has a f/u appt with PCP in the next couple of weeks.  Inpatient Diabetes Program Recommendations:     For home:  Lantus 15 units QD Novolog or Humalog 5 units tidwc. Check blood sugars 3-4x/day and follow-up with PCP.  Discussed diet, portion control, eating low-CHO foods, eliminating juice altogether from diet.  Encouraged pt to get exercise each day as this will help control blood sugars, also.  Very appreciative of visit, although doesn't seem to think that diabetes control is very important.  Discussed with RN.  Thank you. Ailene Ards, RD, LDN, CDE Inpatient Diabetes Coordinator [phone]

## 2020-03-05 NOTE — Discharge Summary (Signed)
Physician Discharge Summary  Jacob [name] GEX:[id number] DOB: Sep 07, 1991 DOA: 03/03/2020  PCP: System, Pcp Not In  Admit date: 03/03/2020 Discharge date: 03/05/2020  Admitted From: Home Disposition: Home  Recommendations for Outpatient Follow-up:  1. Follow up with PCP in 1-2 weeks 2. Please obtain BMP/CBC in one week your next doctors visit.  3. Resume his home Lantus, Metformin.  He needs a close follow-up outpatient endocrinology. 4. Resume his outpatient psychiatry medications which he has been noncompliant with.   Discharge Condition: Stable CODE STATUS: Full code Diet recommendation: Diabetic  Brief/Interim Summary: 29 year old with history of diabetes mellitus type 1, insulin-dependent, MDD presented with hyperglycemia.  Diagnosed with DKA admitted on DKA protocol.  Over the course of 24 hours his anion gap closed and he was transitioned to subcutaneous insulin.  He was tolerating p.o. therefore discharged in stable condition.  Looking at his previous records he does have extensive psychiatry condition and has remained noncompliant with his medication.  Feel like illicit this is properly dressed and controlled he will remain at risk of noncompliance and recurrent admissions to the hospital.  He will require close follow-up outpatient with psychiatry.  He would also benefit from social support from his family.  Overall is medically stable to be discharged.  Denies any suicidal or homicidal ideation.  Anion gap is closed, tolerating oral diet without any issues.  Assessment and plan.  Diabetic ketoacidosis History of diabetes mellitus type 1, insulin-dependent -Medication noncompliant.  His anion gap is closed, he has been transitioned to his home regimen of Lantus 15 units daily, Metformin twice daily.  Continue sliding scale while he is here then discharge home.  Seen by diabetic coordinator, education provided.  Until his underlying psychiatry condition has been addressed  appropriately he is at a risk of readmission due to noncompliance.  He will need significant amount of family support.  Has been seen by psychiatry multiple times.  Currently he is not suicidal or homicidal.  Bipolar disorder-major depressive disorder -Denies any suicidal homicidal ideation.  Will resume his home psychiatry medications.  He should already have active prescriptions therefore I will not refill any at this time.  Highly recommended following up with outpatient psychiatry.  Discharge Diagnoses:  Principal Problem:   DKA (diabetic ketoacidoses) (HCC) Active Problems:   MDD (major depressive disorder), recurrent, severe, with psychosis (HCC)   Polycythemia   AKI (acute kidney injury) (HCC)   Emesis    Consultations:  Diabetic coordinator  Subjective: Overall feels okay no complaints.  Tells me he has been noncompliant with his medication but also blames his family for not feeding him right food.  Discharge Exam: Vitals:   03/05/20 0537 03/05/20 1139  BP: 123/81 125/84  Pulse: (!) 59 79  Resp: 18 17  Temp: 97.6 F (36.4 C) 98.3 F (36.8 C)  SpO2: 100% 98%   Vitals:   03/04/20 2146 03/05/20 0253 03/05/20 0537 03/05/20 1139  BP: 125/85 125/83 123/81 125/84  Pulse: 64 70 (!) 59 79  Resp: 16 17 18 17   Temp: 98.5 F (36.9 C) (!) 97.3 F (36.3 C) 97.6 F (36.4 C) 98.3 F (36.8 C)  TempSrc: Oral Oral Oral Oral  SpO2: 100% 100% 100% 98%  Weight:      Height:        General: Pt is alert, awake, not in acute distress Cardiovascular: RRR, S1/S2 +, no rubs, no gallops Respiratory: CTA bilaterally, no wheezing, no rhonchi Abdominal: Soft, NT, ND, bowel sounds + Extremities: no edema, no  cyanosis  Discharge Instructions   Allergies as of 03/05/2020   No Known Allergies     Medication List    STOP taking these medications   Basaglar KwikPen 100 UNIT/ML     TAKE these medications   benztropine 0.5 MG tablet Commonly known as: COGENTIN Take 1 tablet  (0.5 mg total) by mouth 2 (two) times daily.   carbamazepine 100 MG chewable tablet Commonly known as: TEGRETOL 1 in am 2 at hs   Levemir FlexTouch 100 UNIT/ML FlexPen Generic drug: insulin detemir Inject 15 Units into the skin at bedtime.   metFORMIN 500 MG tablet Commonly known as: GLUCOPHAGE Take 500 mg by mouth 2 (two) times daily.   Pen Needles 32G X 4 MM Misc 1 application by Does not apply route daily.   risperiDONE 3 MG tablet Commonly known as: RISPERDAL Take 2 tablets (6 mg total) by mouth at bedtime.   traZODone 150 MG tablet Commonly known as: DESYREL Take 1 tablet (150 mg total) by mouth at bedtime as needed for sleep.       No Known Allergies  You were cared for by a hospitalist during your hospital stay. If you have any questions about your discharge medications or the care you received while you were in the hospital after you are discharged, you can call the unit and asked to speak with the hospitalist on call if the hospitalist that took care of you is not available. Once you are discharged, your primary care physician will handle any further medical issues. Please note that no refills for any discharge medications will be authorized once you are discharged, as it is imperative that you return to your primary care physician (or establish a relationship with a primary care physician if you do not have one) for your aftercare needs so that they can reassess your need for medications and monitor your lab values.   Procedures/Studies: US Abdomen Limited RUQ  Result Date: 03/04/2020 CLINICAL DATA:  Elevated liver function tests. EXAM: ULTRASOUND ABDOMEN LIMITED RIGHT UPPER QUADRANT COMPARISON:  None. FINDINGS: Gallbladder: No gallstones or wall thickening visualized. No sonographic Murphy sign noted by sonographer. Common bile duct: Diameter: 2 mm Liver: No focal lesion identified. Within normal limits in parenchymal echogenicity. Portal vein is patent on color Doppler  imaging with normal direction of blood flow towards the liver. Other: None. IMPRESSION: Normal right upper quadrant ultrasound. Electronically Signed   By: Deatra Robinson M.D.   On: 03/04/2020 01:36      The results of significant diagnostics from this hospitalization (including imaging, microbiology, ancillary and laboratory) are listed below for reference.     Microbiology: Recent Results (from the past 240 hour(s))  Respiratory Panel by RT PCR (Flu A&B, Covid) - Nasopharyngeal Swab     Status: None   Collection Time: 03/04/20 12:53 AM   Specimen: Nasopharyngeal Swab  Result Value Ref Range Status   SARS Coronavirus 2 by RT PCR NEGATIVE NEGATIVE Final    Comment: (NOTE) SARS-CoV-2 target nucleic acids are NOT DETECTED. The SARS-CoV-2 RNA is generally detectable in upper respiratoy specimens during the acute phase of infection. The lowest concentration of SARS-CoV-2 viral copies this assay can detect is 131 copies/mL. A negative result does not preclude SARS-Cov-2 infection and should not be used as the sole basis for treatment or other patient management decisions. A negative result may occur with  improper specimen collection/handling, submission of specimen other than nasopharyngeal swab, presence of viral mutation(s) within the areas targeted by  this assay, and inadequate number of viral copies (<131 copies/mL). A negative result must be combined with clinical observations, patient history, and epidemiological information. The expected result is Negative. Fact Sheet for Patients:  PinkCheek.be Fact Sheet for Healthcare Providers:  GravelBags.it This test is not yet ap proved or cleared by the [place] FDA and  has been authorized for detection and/or diagnosis of SARS-CoV-2 by FDA under an Emergency Use Authorization (EUA). This EUA will remain  in effect (meaning this test can be used) for the duration of  the COVID-19 declaration under Section 564(b)(1) of the Act, 21 U.S.C. section 360bbb-3(b)(1), unless the authorization is terminated or revoked sooner.    Influenza A by PCR NEGATIVE NEGATIVE Final   Influenza B by PCR NEGATIVE NEGATIVE Final    Comment: (NOTE) The Xpert Xpress SARS-CoV-2/FLU/RSV assay is intended as an aid in  the diagnosis of influenza from Nasopharyngeal swab specimens and  should not be used as a sole basis for treatment. Nasal washings and  aspirates are unacceptable for Xpert Xpress SARS-CoV-2/FLU/RSV  testing. Fact Sheet for Patients: PinkCheek.be Fact Sheet for Healthcare Providers: GravelBags.it This test is not yet approved or cleared by the [place] FDA and  has been authorized for detection and/or diagnosis of SARS-CoV-2 by  FDA under an Emergency Use Authorization (EUA). This EUA will remain  in effect (meaning this test can be used) for the duration of the  Covid-19 declaration under Section 564(b)(1) of the Act, 21  U.S.C. section 360bbb-3(b)(1), unless the authorization is  terminated or revoked. Performed at [facility], [place] [address], [place], [place] [address]      Labs: BNP (last 3 results) No results for input(s): BNP in the last 8760 hours. Basic Metabolic Panel: Recent Labs  Lab 03/04/20 0726 03/04/20 1020 03/04/20 1357 03/04/20 1809 03/05/20 0150  NA 136 136 137 137 135  K 4.0 3.8 3.5 3.1* 3.5  CL 103 104 102 103 102  CO2 19* 23 24 24 25   GLUCOSE 172* 182* 182* 213* 209*  BUN 11 11 10 10 10   CREATININE 0.90 0.84 0.73 0.77 0.77  CALCIUM 8.6* 8.6* 8.6* 8.7* 8.4*  MG  --   --   --   --  1.8   Liver Function Tests: Recent Labs  Lab 03/03/20 2311  AST 24  ALT 51*  ALKPHOS 115  BILITOT 1.7*  PROT 9.2*  ALBUMIN 5.3*   Recent Labs  Lab 03/03/20 2135  LIPASE 20   No results for input(s): AMMONIA in the last 168 hours. CBC: Recent Labs   Lab 03/03/20 2135 03/04/20 0700 03/05/20 0150  WBC 10.5 8.2 4.7  HGB 18.4* 15.2 13.7  HCT 55.5* 44.2 38.8*  MCV 87.4 83.7 83.8  PLT 347 251 231   Cardiac Enzymes: Recent Labs  Lab 03/03/20 2311  CKTOTAL 79   BNP: Invalid input(s): POCBNP CBG: Recent Labs  Lab 03/04/20 1515 03/04/20 1645 03/04/20 2146 03/05/20 0729 03/05/20 1135  GLUCAP 228* 187* 173* 245* 299*   D-Dimer No results for input(s): DDIMER in the last 72 hours. Hgb A1c Recent Labs    03/04/20 0100  HGBA1C 15.0*   Lipid Profile No results for input(s): CHOL, HDL, LDLCALC, TRIG, CHOLHDL, LDLDIRECT in the last 72 hours. Thyroid function studies No results for input(s): TSH, T4TOTAL, T3FREE, THYROIDAB in the last 72 hours.  Invalid input(s): FREET3 Anemia work up No results for input(s): VITAMINB12, FOLATE, FERRITIN, TIBC, IRON, RETICCTPCT in the last 72 hours.  Urinalysis    Component Value Date/Time   COLORURINE YELLOW 03/03/2020 2247   APPEARANCEUR CLEAR 03/03/2020 2247   LABSPEC 1.027 03/03/2020 2247   PHURINE 5.0 03/03/2020 2247   GLUCOSEU >=500 (A) 03/03/2020 2247   HGBUR MODERATE (A) 03/03/2020 2247   BILIRUBINUR NEGATIVE 03/03/2020 2247   BILIRUBINUR negative 11/06/2018 1449   KETONESUR 80 (A) 03/03/2020 2247   PROTEINUR >=300 (A) 03/03/2020 2247   UROBILINOGEN 0.2 11/06/2018 1449   NITRITE NEGATIVE 03/03/2020 2247   LEUKOCYTESUR NEGATIVE 03/03/2020 2247   Sepsis Labs Invalid input(s): PROCALCITONIN,  WBC,  LACTICIDVEN Microbiology Recent Results (from the past 240 hour(s))  Respiratory Panel by RT PCR (Flu A&B, Covid) - Nasopharyngeal Swab     Status: None   Collection Time: 03/04/20 12:53 AM   Specimen: Nasopharyngeal Swab  Result Value Ref Range Status   SARS Coronavirus 2 by RT PCR NEGATIVE NEGATIVE Final    Comment: (NOTE) SARS-CoV-2 target nucleic acids are NOT DETECTED. The SARS-CoV-2 RNA is generally detectable in upper respiratoy specimens during the acute phase of  infection. The lowest concentration of SARS-CoV-2 viral copies this assay can detect is 131 copies/mL. A negative result does not preclude SARS-Cov-2 infection and should not be used as the sole basis for treatment or other patient management decisions. A negative result may occur with  improper specimen collection/handling, submission of specimen other than nasopharyngeal swab, presence of viral mutation(s) within the areas targeted by this assay, and inadequate number of viral copies (<131 copies/mL). A negative result must be combined with clinical observations, patient history, and epidemiological information. The expected result is Negative. Fact Sheet for Patients:  https://www.moore.com/ Fact Sheet for Healthcare Providers:  https://www.young.biz/ This test is not yet ap proved or cleared by the [place] FDA and  has been authorized for detection and/or diagnosis of SARS-CoV-2 by FDA under an Emergency Use Authorization (EUA). This EUA will remain  in effect (meaning this test can be used) for the duration of the COVID-19 declaration under Section 564(b)(1) of the Act, 21 U.S.C. section 360bbb-3(b)(1), unless the authorization is terminated or revoked sooner.    Influenza A by PCR NEGATIVE NEGATIVE Final   Influenza B by PCR NEGATIVE NEGATIVE Final    Comment: (NOTE) The Xpert Xpress SARS-CoV-2/FLU/RSV assay is intended as an aid in  the diagnosis of influenza from Nasopharyngeal swab specimens and  should not be used as a sole basis for treatment. Nasal washings and  aspirates are unacceptable for Xpert Xpress SARS-CoV-2/FLU/RSV  testing. Fact Sheet for Patients: https://www.moore.com/ Fact Sheet for Healthcare Providers: https://www.young.biz/ This test is not yet approved or cleared by the [place] FDA and  has been authorized for detection and/or diagnosis of SARS-CoV-2 by  FDA under  an Emergency Use Authorization (EUA). This EUA will remain  in effect (meaning this test can be used) for the duration of the  Covid-19 declaration under Section 564(b)(1) of the Act, 21  U.S.C. section 360bbb-3(b)(1), unless the authorization is  terminated or revoked. Performed at [facility], [address] [place], [place] [address]      Time coordinating discharge:  I have spent 35 minutes face to face with the patient and on the ward discussing the patients care, assessment, plan and disposition with other care givers. >50% of the time was devoted counseling the patient about the risks and benefits of treatment/Discharge disposition and coordinating care.   SIGNED:   Dimple Nanas, MD  [facility] 03/05/2020, 12:23 PM  If 7PM-7AM, please contact night-coverage

## 2020-03-05 NOTE — Consult Note (Signed)
[facility] Face-to-Face Psychiatry Consult   Reason for Consult:  "History of MDD and noncompliant with medications. Presenting with severe DKA" Referring Physician:  Dr. Nelson Chimes Patient Identification: Ollie Esty MRN:  [id number] Principal Diagnosis: DKA (diabetic ketoacidoses) [facility]) Diagnosis:  Principal Problem:   DKA (diabetic ketoacidoses) (HCC) Active Problems:   MDD (major depressive disorder), recurrent, severe, with psychosis (HCC)   Polycythemia   AKI (acute kidney injury) (HCC)   Emesis   Total Time spent with patient: 30 minutes  Subjective:   Eathon Foti is a 29 y.o. male patient.  Patient assessed by nurse practitioner.  Patient alert and oriented, answers appropriately.  Patient's grandmother, Ulysess Witz, at bedside.  Patient request grandmother present during assessment, patient gives verbal consent to speak with grandmother regarding collateral information. Patient states "I had too much sugar and I was not checking up on my medications."  Patient states "sometimes my diabetes medications make me feel tired." Patient denies suicidal and homicidal ideations.  Patient denies history of self-harm.  Patient denies auditory and visual hallucinations.  Patient denies symptoms of paranoia. Patient reports he lives in [place] with his grandmother. Patient denies access to weapons.  Patient reports he is currently unemployed.  Patient reports occasional alcohol use.  Patient reports daily use of marijuana, patient denies substance use aside from marijuana.  Patient reports "marijuana is not a drug do not want to stop using marijuana." Patient denies outpatient psychiatric treatment.  Patient reports "I prescribed like 6 medications but I am not going to take that many pills." Patient reports appetite and sleep are "good." Spoke with patient's grandmother, Rayland Hamed: patient's grandmother denies concerns for patient safety.  Patient's grandmother reports concerns that patient  frequently "paces in the house."  Patient's grandmother reports patient noncompliant with psychiatric meds and outpatient psychiatric treatment.  Patient's grandmother reports patient "threw all of his psychiatric medications away months ago."  Patient's grandmother reports concerns regarding patient's noncompliance with medications.     HPI: Patient admitted related to hyperglycemia uncontrolled insulin-dependent diabetes.  Past Psychiatric History: Major depressive disorder with psychosis  Risk to Self:  Denies Risk to Others:  Denies Prior Inpatient Therapy:  [facility] 01/2018, 09/2019, 11/2019 Prior Outpatient Therapy:  Therapist- Marcy Panning, Psychiatrist- unable to recall provider names  Past Medical History:  Past Medical History:  Diagnosis Date  . Diabetes mellitus without complication (HCC)    History reviewed. No pertinent surgical history. Family History: History reviewed. No pertinent family history. Family Psychiatric  History: Mother-addiction Social History:  Social History   Substance and Sexual Activity  Alcohol Use No     Social History   Substance and Sexual Activity  Drug Use Not Currently    Social History   Socioeconomic History  . Marital status: Single    Spouse name: Not on file  . Number of children: Not on file  . Years of education: Not on file  . Highest education level: Not on file  Occupational History  . Not on file  Tobacco Use  . Smoking status: Never Smoker  . Smokeless tobacco: Never Used  Substance and Sexual Activity  . Alcohol use: No  . Drug use: Not Currently  . Sexual activity: Not on file  Other Topics Concern  . Not on file  Social History Narrative  . Not on file   Social Determinants of Health   Financial Resource Strain:   . Difficulty of Paying Living Expenses:   Food Insecurity:   . Worried About  Running Out of Food in the Last Year:   . [place] in the Last Year:   Transportation Needs:   . Lack of  Transportation (Medical):   Marland Kitchen Lack of Transportation (Non-Medical):   Physical Activity:   . Days of Exercise per Week:   . Minutes of Exercise per Session:   Stress:   . Feeling of Stress :   Social Connections:   . Frequency of Communication with Friends and Family:   . Frequency of Social Gatherings with Friends and Family:   . Attends Religious Services:   . Active Member of Clubs or Organizations:   . Attends Archivist Meetings:   Marland Kitchen Marital Status:    Additional Social History:    Allergies:  No Known Allergies  Labs:  Results for orders placed or performed during the hospital encounter of 03/03/20 (from the past 48 hour(s))  CBG monitoring, ED     Status: Abnormal   Collection Time: 03/03/20  8:47 PM  Result Value Ref Range   Glucose-Capillary 417 (H) 70 - 99 mg/dL    Comment: Glucose reference range applies only to samples taken after fasting for at least 8 hours.  Lipase, blood     Status: None   Collection Time: 03/03/20  9:35 PM  Result Value Ref Range   Lipase 20 11 - 51 U/L    Comment: Performed at [facility], [place] [address] [place], [place] [address]  CBC     Status: Abnormal   Collection Time: 03/03/20  9:35 PM  Result Value Ref Range   WBC 10.5 4.0 - 10.5 K/uL   RBC 6.35 (H) 4.22 - 5.81 MIL/uL   Hemoglobin 18.4 (H) 13.0 - 17.0 g/dL   HCT 55.5 (H) 39.0 - 52.0 %   MCV 87.4 80.0 - 100.0 fL   MCH 29.0 26.0 - 34.0 pg   MCHC 33.2 30.0 - 36.0 g/dL   RDW 12.6 11.5 - 15.5 %   Platelets 347 150 - 400 K/uL   nRBC 0.0 0.0 - 0.2 %    Comment: Performed at [facility], [place] [address] [place], [place] [address]  Urinalysis, Routine w reflex microscopic     Status: Abnormal   Collection Time: 03/03/20 10:47 PM  Result Value Ref Range   Color, Urine YELLOW YELLOW   APPearance CLEAR CLEAR   Specific Gravity, Urine 1.027 1.005 - 1.030   pH 5.0 5.0 - 8.0   Glucose, UA >=500 (A) NEGATIVE mg/dL   Hgb urine dipstick  MODERATE (A) NEGATIVE   Bilirubin Urine NEGATIVE NEGATIVE   Ketones, ur 80 (A) NEGATIVE mg/dL   Protein, ur >=300 (A) NEGATIVE mg/dL   Nitrite NEGATIVE NEGATIVE   Leukocytes,Ua NEGATIVE NEGATIVE   RBC / HPF 0-5 0 - 5 RBC/hpf   WBC, UA 0-5 0 - 5 WBC/hpf   Bacteria, UA NONE SEEN NONE SEEN   Squamous Epithelial / LPF 0-5 0 - 5   Mucus PRESENT    Hyaline Casts, UA PRESENT     Comment: Performed at [facility], [place] [address], [place], [place] [address]  Comprehensive metabolic panel     Status: Abnormal   Collection Time: 03/03/20 11:11 PM  Result Value Ref Range   Sodium 133 (L) 135 - 145 mmol/L   Potassium 4.9 3.5 - 5.1 mmol/L   Chloride 97 (L) 98 - 111 mmol/L   CO2 8 (L) 22 - 32 mmol/L   Glucose,  Bld 423 (H) 70 - 99 mg/dL    Comment: Glucose reference range applies only to samples taken after fasting for at least 8 hours.   BUN 16 6 - 20 mg/dL   Creatinine, Ser 1.19 (H) 0.61 - 1.24 mg/dL   Calcium 9.7 8.9 - 14.7 mg/dL   Total Protein 9.2 (H) 6.5 - 8.1 g/dL   Albumin 5.3 (H) 3.5 - 5.0 g/dL   AST 24 15 - 41 U/L   ALT 51 (H) 0 - 44 U/L   Alkaline Phosphatase 115 38 - 126 U/L   Total Bilirubin 1.7 (H) 0.3 - 1.2 mg/dL   GFR calc non Af Amer >60 >60 mL/min   GFR calc Af Amer >60 >60 mL/min   Anion gap 28 (H) 5 - 15    Comment: Performed at [facility], [address], [place], [place] [address]  CK     Status: None   Collection Time: 03/03/20 11:11 PM  Result Value Ref Range   Total CK 79 49 - 397 U/L    Comment: Performed at [facility], [address], [place], [place] [address]  Respiratory Panel by RT PCR (Flu A&B, Covid) - Nasopharyngeal Swab     Status: None   Collection Time: 03/04/20 12:53 AM   Specimen: Nasopharyngeal Swab  Result Value Ref Range   SARS Coronavirus 2 by RT PCR NEGATIVE NEGATIVE    Comment: (NOTE) SARS-CoV-2 target nucleic acids are NOT DETECTED. The SARS-CoV-2 RNA is generally detectable in  upper respiratoy specimens during the acute phase of infection. The lowest concentration of SARS-CoV-2 viral copies this assay can detect is 131 copies/mL. A negative result does not preclude SARS-Cov-2 infection and should not be used as the sole basis for treatment or other patient management decisions. A negative result may occur with  improper specimen collection/handling, submission of specimen other than nasopharyngeal swab, presence of viral mutation(s) within the areas targeted by this assay, and inadequate number of viral copies (<131 copies/mL). A negative result must be combined with clinical observations, patient history, and epidemiological information. The expected result is Negative. Fact Sheet for Patients:  https://www.moore.com/ Fact Sheet for Healthcare Providers:  https://www.young.biz/ This test is not yet ap proved or cleared by the [place] FDA and  has been authorized for detection and/or diagnosis of SARS-CoV-2 by FDA under an Emergency Use Authorization (EUA). This EUA will remain  in effect (meaning this test can be used) for the duration of the COVID-19 declaration under Section 564(b)(1) of the Act, 21 U.S.C. section 360bbb-3(b)(1), unless the authorization is terminated or revoked sooner.    Influenza A by PCR NEGATIVE NEGATIVE   Influenza B by PCR NEGATIVE NEGATIVE    Comment: (NOTE) The Xpert Xpress SARS-CoV-2/FLU/RSV assay is intended as an aid in  the diagnosis of influenza from Nasopharyngeal swab specimens and  should not be used as a sole basis for treatment. Nasal washings and  aspirates are unacceptable for Xpert Xpress SARS-CoV-2/FLU/RSV  testing. Fact Sheet for Patients: https://www.moore.com/ Fact Sheet for Healthcare Providers: https://www.young.biz/ This test is not yet approved or cleared by the [place] FDA and  has been authorized for detection  and/or diagnosis of SARS-CoV-2 by  FDA under an Emergency Use Authorization (EUA). This EUA will remain  in effect (meaning this test can be used) for the duration of the  Covid-19 declaration under Section 564(b)(1) of the Act, 21  U.S.C. section 360bbb-3(b)(1), unless the authorization is  terminated or  revoked. Performed at [facility], [address] [place], [place] [address]   Beta-hydroxybutyric acid     Status: Abnormal   Collection Time: 03/04/20 12:53 AM  Result Value Ref Range   Beta-Hydroxybutyric Acid >8.00 (H) 0.05 - 0.27 mmol/L    Comment: RESULTS CONFIRMED BY MANUAL DILUTION Performed at [facility], [address], [place], [place] [address]   CBG monitoring, ED     Status: Abnormal   Collection Time: 03/04/20 12:54 AM  Result Value Ref Range   Glucose-Capillary 445 (H) 70 - 99 mg/dL    Comment: Glucose reference range applies only to samples taken after fasting for at least 8 hours.  HIV Antibody (routine testing w rflx)     Status: None   Collection Time: 03/04/20 12:54 AM  Result Value Ref Range   HIV Screen 4th Generation wRfx Non Reactive Non Reactive    Comment: Performed at [facility], [address] [place], [place] [address]  Basic metabolic panel     Status: Abnormal   Collection Time: 03/04/20 12:54 AM  Result Value Ref Range   Sodium 135 135 - 145 mmol/L   Potassium 5.1 3.5 - 5.1 mmol/L   Chloride 103 98 - 111 mmol/L   CO2 9 (L) 22 - 32 mmol/L   Glucose, Bld 415 (H) 70 - 99 mg/dL    Comment: Glucose reference range applies only to samples taken after fasting for at least 8 hours.   BUN 14 6 - 20 mg/dL   Creatinine, Ser 5.09 0.61 - 1.24 mg/dL   Calcium 9.3 8.9 - 32.6 mg/dL   GFR calc non Af Amer >60 >60 mL/min   GFR calc Af Amer >60 >60 mL/min   Anion gap 23 (H) 5 - 15    Comment: Performed at [facility], [address] [place], [place] [address]  Hemoglobin A1c     Status:  Abnormal   Collection Time: 03/04/20  1:00 AM  Result Value Ref Range   Hgb A1c MFr Bld 15.0 (H) 4.8 - 5.6 %    Comment: (NOTE) Pre diabetes:          5.7%-6.4% Diabetes:              >6.4% Glycemic control for   <7.0% adults with diabetes    Mean Plasma Glucose 383.8 mg/dL    Comment: Performed at [facility], [address], [place], [place] [address]  Blood gas, arterial     Status: Abnormal   Collection Time: 03/04/20  1:40 AM  Result Value Ref Range   FIO2 21.00    pH, Arterial 7.232 (L) 7.350 - 7.450   pCO2 arterial 23.6 (L) 32.0 - 48.0 mmHg   pO2, Arterial 134 (H) 83.0 - 108.0 mmHg   Bicarbonate 9.6 (L) 20.0 - 28.0 mmol/L   Acid-base deficit 16.6 (H) 0.0 - 2.0 mmol/L   O2 Saturation 98.6 %   Patient temperature 98.6    Allens test (pass/fail) PASS PASS    Comment: Performed at [facility], [address], [place], [place] [address]  CBG monitoring, ED     Status: Abnormal   Collection Time: 03/04/20  2:26 AM  Result Value Ref Range   Glucose-Capillary 250 (H) 70 - 99 mg/dL    Comment: Glucose reference range applies only to samples taken after fasting for at least 8 hours.  CBG monitoring, ED     Status: Abnormal  Collection Time: 03/04/20  4:08 AM  Result Value Ref Range   Glucose-Capillary 118 (H) 70 - 99 mg/dL    Comment: Glucose reference range applies only to samples taken after fasting for at least 8 hours.  CBG monitoring, ED     Status: Abnormal   Collection Time: 03/04/20  6:27 AM  Result Value Ref Range   Glucose-Capillary 154 (H) 70 - 99 mg/dL    Comment: Glucose reference range applies only to samples taken after fasting for at least 8 hours.  CBC     Status: None   Collection Time: 03/04/20  7:00 AM  Result Value Ref Range   WBC 8.2 4.0 - 10.5 K/uL   RBC 5.28 4.22 - 5.81 MIL/uL   Hemoglobin 15.2 13.0 - 17.0 g/dL   HCT 09.8 11.9 - 14.7 %   MCV 83.7 80.0 - 100.0 fL   MCH 28.8 26.0 - 34.0 pg   MCHC 34.4 30.0 - 36.0 g/dL    RDW 82.9 56.2 - 13.0 %   Platelets 251 150 - 400 K/uL   nRBC 0.0 0.0 - 0.2 %    Comment: Performed at [facility], [address], [place], [place] [address]  Basic metabolic panel     Status: Abnormal   Collection Time: 03/04/20  7:26 AM  Result Value Ref Range   Sodium 136 135 - 145 mmol/L   Potassium 4.0 3.5 - 5.1 mmol/L    Comment: DELTA CHECK NOTED   Chloride 103 98 - 111 mmol/L   CO2 19 (L) 22 - 32 mmol/L   Glucose, Bld 172 (H) 70 - 99 mg/dL    Comment: Glucose reference range applies only to samples taken after fasting for at least 8 hours.   BUN 11 6 - 20 mg/dL   Creatinine, Ser 4.69 0.61 - 1.24 mg/dL   Calcium 8.6 (L) 8.9 - 10.3 mg/dL   GFR calc non Af Amer >60 >60 mL/min   GFR calc Af Amer >60 >60 mL/min   Anion gap 14 5 - 15    Comment: Performed at [facility], [address], [place], [place] [address]  Beta-hydroxybutyric acid     Status: Abnormal   Collection Time: 03/04/20  7:26 AM  Result Value Ref Range   Beta-Hydroxybutyric Acid 4.65 (H) 0.05 - 0.27 mmol/L    Comment: RESULTS CONFIRMED BY MANUAL DILUTION Performed at [facility], [address] [place], [place] [address]   Basic metabolic panel     Status: Abnormal   Collection Time: 03/04/20 10:20 AM  Result Value Ref Range   Sodium 136 135 - 145 mmol/L   Potassium 3.8 3.5 - 5.1 mmol/L   Chloride 104 98 - 111 mmol/L   CO2 23 22 - 32 mmol/L   Glucose, Bld 182 (H) 70 - 99 mg/dL    Comment: Glucose reference range applies only to samples taken after fasting for at least 8 hours.   BUN 11 6 - 20 mg/dL   Creatinine, Ser 4.40 0.61 - 1.24 mg/dL   Calcium 8.6 (L) 8.9 - 10.3 mg/dL   GFR calc non Af Amer >60 >60 mL/min   GFR calc Af Amer >60 >60 mL/min   Anion gap 9 5 - 15    Comment: Performed at [facility], [address], [place], [place] [address]  Basic metabolic panel     Status: Abnormal   Collection Time: 03/04/20  1:57 PM   Result  Value Ref Range   Sodium 137 135 - 145 mmol/L   Potassium 3.5 3.5 - 5.1 mmol/L   Chloride 102 98 - 111 mmol/L   CO2 24 22 - 32 mmol/L   Glucose, Bld 182 (H) 70 - 99 mg/dL    Comment: Glucose reference range applies only to samples taken after fasting for at least 8 hours.   BUN 10 6 - 20 mg/dL   Creatinine, Ser 4.09 0.61 - 1.24 mg/dL   Calcium 8.6 (L) 8.9 - 10.3 mg/dL   GFR calc non Af Amer >60 >60 mL/min   GFR calc Af Amer >60 >60 mL/min   Anion gap 11 5 - 15    Comment: Performed at [facility], [address], [place], [place] [address]  Beta-hydroxybutyric acid     Status: Abnormal   Collection Time: 03/04/20  1:57 PM  Result Value Ref Range   Beta-Hydroxybutyric Acid 3.60 (H) 0.05 - 0.27 mmol/L    Comment: Performed at [facility], [address] [place], [place] [address]  CBG monitoring, ED     Status: Abnormal   Collection Time: 03/04/20  3:15 PM  Result Value Ref Range   Glucose-Capillary 228 (H) 70 - 99 mg/dL    Comment: Glucose reference range applies only to samples taken after fasting for at least 8 hours.  Glucose, capillary     Status: Abnormal   Collection Time: 03/04/20  4:45 PM  Result Value Ref Range   Glucose-Capillary 187 (H) 70 - 99 mg/dL    Comment: Glucose reference range applies only to samples taken after fasting for at least 8 hours.  Basic metabolic panel     Status: Abnormal   Collection Time: 03/04/20  6:09 PM  Result Value Ref Range   Sodium 137 135 - 145 mmol/L   Potassium 3.1 (L) 3.5 - 5.1 mmol/L   Chloride 103 98 - 111 mmol/L   CO2 24 22 - 32 mmol/L   Glucose, Bld 213 (H) 70 - 99 mg/dL    Comment: Glucose reference range applies only to samples taken after fasting for at least 8 hours.   BUN 10 6 - 20 mg/dL   Creatinine, Ser 5.62 0.61 - 1.24 mg/dL   Calcium 8.7 (L) 8.9 - 10.3 mg/dL   GFR calc non Af Amer >60 >60 mL/min   GFR calc Af Amer >60 >60 mL/min   Anion gap 10 5 - 15    Comment:  Performed at [facility], [address] [place], [place] [address]  Beta-hydroxybutyric acid     Status: Abnormal   Collection Time: 03/04/20  6:09 PM  Result Value Ref Range   Beta-Hydroxybutyric Acid 4.05 (H) 0.05 - 0.27 mmol/L    Comment: Performed at [facility], [address] [place], [place] [address]  Glucose, capillary     Status: Abnormal   Collection Time: 03/04/20  9:46 PM  Result Value Ref Range   Glucose-Capillary 173 (H) 70 - 99 mg/dL    Comment: Glucose reference range applies only to samples taken after fasting for at least 8 hours.  Beta-hydroxybutyric acid     Status: Abnormal   Collection Time: 03/04/20  9:50 PM  Result Value Ref Range   Beta-Hydroxybutyric Acid 2.95 (H) 0.05 - 0.27 mmol/L    Comment: Performed at [facility], [address], [place], [place] [address]  Beta-hydroxybutyric acid     Status: Abnormal   Collection  Time: 03/05/20  1:50 AM  Result Value Ref Range   Beta-Hydroxybutyric Acid 1.78 (H) 0.05 - 0.27 mmol/L    Comment: Performed at [facility], [address] [place], [place] [address]  Basic metabolic panel     Status: Abnormal   Collection Time: 03/05/20  1:50 AM  Result Value Ref Range   Sodium 135 135 - 145 mmol/L   Potassium 3.5 3.5 - 5.1 mmol/L   Chloride 102 98 - 111 mmol/L   CO2 25 22 - 32 mmol/L   Glucose, Bld 209 (H) 70 - 99 mg/dL    Comment: Glucose reference range applies only to samples taken after fasting for at least 8 hours.   BUN 10 6 - 20 mg/dL   Creatinine, Ser 1.91 0.61 - 1.24 mg/dL   Calcium 8.4 (L) 8.9 - 10.3 mg/dL   GFR calc non Af Amer >60 >60 mL/min   GFR calc Af Amer >60 >60 mL/min   Anion gap 8 5 - 15    Comment: Performed at [facility], [address], [place], [place] [address]  CBC     Status: Abnormal   Collection Time: 03/05/20  1:50 AM  Result Value Ref Range   WBC 4.7 4.0 - 10.5 K/uL   RBC 4.63  4.22 - 5.81 MIL/uL   Hemoglobin 13.7 13.0 - 17.0 g/dL   HCT 56.2 (L) 13.0 - 86.5 %   MCV 83.8 80.0 - 100.0 fL   MCH 29.6 26.0 - 34.0 pg   MCHC 35.3 30.0 - 36.0 g/dL   RDW 78.4 69.6 - 29.5 %   Platelets 231 150 - 400 K/uL   nRBC 0.0 0.0 - 0.2 %    Comment: Performed at [facility], [address] [place], [place] [address]  Magnesium     Status: None   Collection Time: 03/05/20  1:50 AM  Result Value Ref Range   Magnesium 1.8 1.7 - 2.4 mg/dL    Comment: Performed at [facility], [address] [place], [place] [address]  Beta-hydroxybutyric acid     Status: Abnormal   Collection Time: 03/05/20  5:57 AM  Result Value Ref Range   Beta-Hydroxybutyric Acid 1.49 (H) 0.05 - 0.27 mmol/L    Comment: Performed at [facility], [address], [place], [place] [address]  Glucose, capillary     Status: Abnormal   Collection Time: 03/05/20  7:29 AM  Result Value Ref Range   Glucose-Capillary 245 (H) 70 - 99 mg/dL    Comment: Glucose reference range applies only to samples taken after fasting for at least 8 hours.  Beta-hydroxybutyric acid     Status: None   Collection Time: 03/05/20 10:11 AM  Result Value Ref Range   Beta-Hydroxybutyric Acid 0.24 0.05 - 0.27 mmol/L    Comment: Performed at [facility], [address], [place], [place] [address]    Current Facility-Administered Medications  Medication Dose Route Frequency Provider Last Rate Last Admin  . dextrose 50 % solution 0-50 mL  0-50 mL Intravenous PRN John Giovanni, MD      . enoxaparin (LOVENOX) injection 40 mg  40 mg Subcutaneous QHS John Giovanni, MD   40 mg at 03/04/20 2159  . insulin aspart (novoLOG) injection 0-5 Units  0-5 Units Subcutaneous QHS Marikay Alar, FNP      . insulin aspart (novoLOG) injection 0-9 Units  0-9 Units Subcutaneous TID WC Jae Dire, MD  3 Units at 03/05/20 0811  . insulin aspart (novoLOG) injection 3 Units   3 Units Subcutaneous TID WC Jae DireSegal, Jared E, MD   3 Units at 03/05/20 [phone]  . insulin glargine (LANTUS) injection 15 Units  15 Units Subcutaneous Daily Jae DireSegal, Jared E, MD   15 Units at 03/05/20 1027  . ondansetron (ZOFRAN) injection 4 mg  4 mg Intravenous Q6H PRN John Giovanniathore, Vasundhra, MD      . sodium bicarbonate 150 mEq in sterile water 1,000 mL infusion   Intravenous Continuous John Giovanniathore, Vasundhra, MD   Stopped at 03/04/20 1208    Musculoskeletal: Strength & Muscle Tone: within normal limits Gait & Station: normal Patient leans: N/A  Psychiatric Specialty Exam: Physical Exam  Nursing note and vitals reviewed. Constitutional: He is oriented to person, place, and time. He appears well-developed.  HENT:  Head: Normocephalic.  Cardiovascular: Normal rate.  Respiratory: Effort normal.  Musculoskeletal:        General: Normal range of motion.     Cervical back: Normal range of motion.  Neurological: He is alert and oriented to person, place, and time.  Psychiatric: He has a normal mood and affect. His behavior is normal. Judgment and thought content normal.    Review of Systems  Constitutional: Negative.   HENT: Negative.   Eyes: Negative.   Respiratory: Negative.   Cardiovascular: Negative.   Gastrointestinal: Negative.   Genitourinary: Negative.   Musculoskeletal: Negative.   Skin: Negative.   Neurological: Negative.   Psychiatric/Behavioral: Negative.     Blood pressure 123/81, pulse (!) 59, temperature 97.6 F (36.4 C), temperature source Oral, resp. rate 18, height 5\' 10"  (1.778 m), weight 59 kg, SpO2 100 %.Body mass index is 18.65 kg/m.  General Appearance: Casual and Fairly Groomed  Eye Contact:  Good  Speech:  Clear and Coherent and Normal Rate  Volume:  Normal  Mood:  Euthymic  Affect:  Appropriate and Congruent  Thought Process:  Coherent, Goal Directed and Descriptions of Associations: Intact  Orientation:  Full (Time, Place, and Person)  Thought Content:  WDL and  Logical  Suicidal Thoughts:  No  Homicidal Thoughts:  No  Memory:  Immediate;   Good Recent;   Good Remote;   Good  Judgement:  Good  Insight:  Fair  Psychomotor Activity:  Normal  Concentration:  Concentration: Good and Attention Span: Good  Recall:  Good  Fund of Knowledge:  Good  Language:  Good  Akathisia:  No  Handed:  Right  AIMS (if indicated):     Assets:  Communication Skills Desire for Improvement Financial Resources/Insurance Housing Intimacy Leisure Time Physical Health Resilience Social Support Talents/Skills Transportation  ADL's:  Intact  Cognition:  WNL  Sleep:        Treatment Plan Summary: Medication management Neurontin 100mg  PO BID to address disorder of mood.  Follow-up with established outpatient psychiatry, [facility].  Disposition: No evidence of imminent risk to self or others at present.   Patient does not meet criteria for psychiatric inpatient admission. Supportive therapy provided about ongoing stressors.  Patrcia Dollyina L Tate, FNP 03/05/2020 11:02 AM

## 2020-06-05 ENCOUNTER — Other Ambulatory Visit: Payer: Self-pay

## 2020-06-05 ENCOUNTER — Emergency Department (HOSPITAL_COMMUNITY)
Admission: EM | Admit: 2020-06-05 | Discharge: 2020-06-06 | Disposition: A | Discharge status: Other Acute Inpatient Hospital | Payer: Medicaid Other | Attending: Emergency Medicine | Admitting: Emergency Medicine

## 2020-06-05 ENCOUNTER — Encounter (HOSPITAL_COMMUNITY): Payer: Self-pay | Admitting: Emergency Medicine

## 2020-06-05 DIAGNOSIS — E1110 Type 2 diabetes mellitus with ketoacidosis without coma: Secondary | ICD-10-CM | POA: Insufficient documentation

## 2020-06-05 DIAGNOSIS — Z20822 Contact with and (suspected) exposure to covid-19: Secondary | ICD-10-CM | POA: Insufficient documentation

## 2020-06-05 DIAGNOSIS — R443 Hallucinations, unspecified: Secondary | ICD-10-CM | POA: Diagnosis not present

## 2020-06-05 DIAGNOSIS — Z7984 Long term (current) use of oral hypoglycemic drugs: Secondary | ICD-10-CM | POA: Insufficient documentation

## 2020-06-05 DIAGNOSIS — R456 Violent behavior: Secondary | ICD-10-CM | POA: Insufficient documentation

## 2020-06-05 DIAGNOSIS — Z046 Encounter for general psychiatric examination, requested by authority: Secondary | ICD-10-CM | POA: Diagnosis present

## 2020-06-05 DIAGNOSIS — F333 Major depressive disorder, recurrent, severe with psychotic symptoms: Secondary | ICD-10-CM | POA: Diagnosis present

## 2020-06-05 LAB — COMPREHENSIVE METABOLIC PANEL
ALT: 24 U/L (ref 0–44)
AST: 21 U/L (ref 15–41)
Albumin: 4.7 g/dL (ref 3.5–5.0)
Alkaline Phosphatase: 65 U/L (ref 38–126)
Anion gap: 11 (ref 5–15)
BUN: 17 mg/dL (ref 6–20)
CO2: 29 mmol/L (ref 22–32)
Calcium: 9.7 mg/dL (ref 8.9–10.3)
Chloride: 98 mmol/L (ref 98–111)
Creatinine, Ser: 0.79 mg/dL (ref 0.61–1.24)
GFR calc Af Amer: >60 mL/min (ref >60)
GFR calc non Af Amer: >60 mL/min (ref >60)
Glucose, Bld: 261 mg/dL — ABNORMAL HIGH (ref 70–99)
Potassium: 4.3 mmol/L (ref 3.5–5.1)
Sodium: 138 mmol/L (ref 135–145)
Total Bilirubin: 0.7 mg/dL (ref 0.3–1.2)
Total Protein: 8.2 g/dL — ABNORMAL HIGH (ref 6.5–8.1)

## 2020-06-05 LAB — RAPID URINE DRUG SCREEN, HOSP PERFORMED
Amphetamines: NOT DETECTED
Barbiturates: NOT DETECTED
Benzodiazepines: NOT DETECTED
Cocaine: NOT DETECTED
Opiates: NOT DETECTED
Tetrahydrocannabinol: NOT DETECTED

## 2020-06-05 LAB — CBC WITH DIFFERENTIAL/PLATELET
Abs Immature Granulocytes: 0.01 10*3/uL (ref 0.00–0.07)
Basophils Absolute: 0.1 10*3/uL (ref 0.0–0.1)
Basophils Relative: 1 %
Eosinophils Absolute: 0.1 10*3/uL (ref 0.0–0.5)
Eosinophils Relative: 1 %
HCT: 46.6 % (ref 39.0–52.0)
Hemoglobin: 15.6 g/dL (ref 13.0–17.0)
Immature Granulocytes: 0 %
Lymphocytes Relative: 27 %
Lymphs Abs: 1.9 10*3/uL (ref 0.7–4.0)
MCH: 29.7 pg (ref 26.0–34.0)
MCHC: 33.5 g/dL (ref 30.0–36.0)
MCV: 88.8 fL (ref 80.0–100.0)
Monocytes Absolute: 0.7 10*3/uL (ref 0.1–1.0)
Monocytes Relative: 9 %
Neutro Abs: 4.4 10*3/uL (ref 1.7–7.7)
Neutrophils Relative %: 62 %
Platelets: 299 10*3/uL (ref 150–400)
RBC: 5.25 MIL/uL (ref 4.22–5.81)
RDW: 11.9 % (ref 11.5–15.5)
WBC: 7.2 10*3/uL (ref 4.0–10.5)
nRBC: 0 % (ref 0.0–0.2)

## 2020-06-05 LAB — URINALYSIS, ROUTINE W REFLEX MICROSCOPIC
Bacteria, UA: NONE SEEN
Bilirubin Urine: NEGATIVE
Glucose, UA: >=500 mg/dL — AB
Hgb urine dipstick: NEGATIVE
Ketones, ur: 20 mg/dL — AB
Leukocytes,Ua: NEGATIVE
Nitrite: NEGATIVE
Protein, ur: NEGATIVE mg/dL
Specific Gravity, Urine: 1.035 — ABNORMAL HIGH (ref 1.005–1.030)
pH: 6 (ref 5.0–8.0)

## 2020-06-05 LAB — CBG MONITORING, ED
Glucose-Capillary: 252 mg/dL — ABNORMAL HIGH (ref 70–99)
Glucose-Capillary: 435 mg/dL — ABNORMAL HIGH (ref 70–99)
Glucose-Capillary: 70 mg/dL (ref 70–99)

## 2020-06-05 LAB — ETHANOL: Alcohol, Ethyl (B): <10 mg/dL (ref <10)

## 2020-06-05 LAB — SARS CORONAVIRUS 2 BY RT PCR (HOSPITAL ORDER, PERFORMED IN ~~LOC~~ HOSPITAL LAB): SARS Coronavirus 2: NEGATIVE

## 2020-06-05 MED ORDER — RISPERIDONE 0.5 MG PO TABS
0.5000 mg | ORAL_TABLET | Freq: Two times a day (BID) | ORAL | Status: DC
  Administered 2020-06-05 – 2020-06-06 (×3): 0.5 mg via ORAL
  Filled 2020-06-05 (×3): qty 1

## 2020-06-05 MED ORDER — INSULIN ASPART 100 UNIT/ML ~~LOC~~ SOLN
4.0000 [IU] | Freq: Three times a day (TID) | SUBCUTANEOUS | Status: DC
  Filled 2020-06-05: qty 0.04

## 2020-06-05 MED ORDER — TRAZODONE HCL 50 MG PO TABS: 50.0000 mg | ORAL_TABLET | Freq: Every evening | ORAL | Status: DC | PRN

## 2020-06-05 MED ORDER — CARBAMAZEPINE 200 MG PO TABS
200.0000 mg | ORAL_TABLET | Freq: Two times a day (BID) | ORAL | Status: DC
  Administered 2020-06-05 – 2020-06-06 (×3): 200 mg via ORAL
  Filled 2020-06-05 (×3): qty 1

## 2020-06-05 MED ORDER — GLIPIZIDE ER 10 MG PO TB24
10.0000 mg | ORAL_TABLET | Freq: Every day | ORAL | Status: DC
  Administered 2020-06-06: 10 mg via ORAL
  Filled 2020-06-05: qty 1

## 2020-06-05 MED ORDER — INSULIN DETEMIR 100 UNIT/ML ~~LOC~~ SOLN
20.0000 [IU] | Freq: Two times a day (BID) | SUBCUTANEOUS | Status: DC
  Administered 2020-06-05 – 2020-06-06 (×2): 20 [IU] via SUBCUTANEOUS
  Filled 2020-06-05 (×3): qty 0.2

## 2020-06-05 MED ORDER — BENZTROPINE MESYLATE 0.5 MG PO TABS
0.5000 mg | ORAL_TABLET | Freq: Two times a day (BID) | ORAL | Status: DC
  Administered 2020-06-05 – 2020-06-06 (×3): 0.5 mg via ORAL
  Filled 2020-06-05 (×3): qty 1

## 2020-06-05 MED ORDER — INSULIN ASPART 100 UNIT/ML ~~LOC~~ SOLN
0.0000 [IU] | Freq: Three times a day (TID) | SUBCUTANEOUS | Status: DC
  Administered 2020-06-05: 15 [IU] via SUBCUTANEOUS
  Filled 2020-06-05: qty 0.15
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
## 2020-06-05 NOTE — ED Notes (Addendum)
Garnet RN, after speaking with the [facility] at [facility], called back to inform me that the patient would not be able to transfer over there until his CBG is under 300 for 24 hours. MD made aware.

## 2020-06-05 NOTE — Consult Note (Signed)
  Reviewed Jacob [name] with TTS counselor.  Patient involuntarily committed by family member related to aggressive behavior at home and failure to comply with psychotropic medications.  Recommend inpatient psychiatric treatment.  Restart home medications, patient has not been taking medications for some time.  Patient is a poor historian regarding medications currently. Patient history includes major depressive disorder with psychotic features. Patient reviewed with Dr. Nelly Rout.  Recommend restart home medications: -Risperdal 0.5 mg by mouth twice daily -Cogentin 0.5 mg by mouth twice daily -Tegretol 200 mg by mouth twice daily -Trazodone 50 mg by mouth nightly as needed

## 2020-06-05 NOTE — BH Assessment (Addendum)
[facility] Assessment Progress Note  Per Berneice Heinrich, NP, this pt requires psychiatric hospitalization.  Percell Boston, RN has assigned pt to [facility] Rm [id number]; [facility] will be ready to receive pt after 20:00.  Pt presents under IVC initiated by pt's grandmother, and upheld by EDP Cherlynn Perches, MD, and IVC documents have been faxed to [facility].  Pt's nurse has been notified, and agrees to call report to [phone].  Pt is to be transported via Patent examiner.  This Clinical research associate noted that Part A of Findings and Custody Order has not been filled out.  At 15:12 I called [employer] and asked them to dispatch [facility] deputy to complete.  Deputy's arrival is pending as of this writing.   Doylene Canning, [place] Behavioral Health Coordinator [phone]   Addendum:  At 15:41 [facility] deputy arrived and completed Part A.  Doylene Canning, [place] Behavioral Health Coordinator [phone]
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
## 2020-06-05 NOTE — ED Notes (Addendum)
Pt presents to tcu with pt belonging bag located in locker 32

## 2020-06-05 NOTE — ED Provider Notes (Signed)
[facility]-EMERGENCY DEPT Provider Note   CSN: [id number] Arrival date & time: 06/05/20  1015     History Chief Complaint  Patient presents with  . IVC    Acy Manna is a 29 y.o. male.   Mental Health Problem Presenting symptoms: aggressive behavior and hallucinations   Presenting symptoms: no suicidal thoughts   Presenting symptoms comment:  Reported by grandmother in IVC paperwork  Patient accompanied by:  Conni Elliot enforcement Onset quality:  Unable to specify Timing:  Unable to specify Progression:  Unable to specify Chronicity:  New Context: not alcohol use, not drug abuse, not noncompliant and not recent medication change   Treatment compliance:  Untreated Relieved by:  Nothing Worsened by:  Nothing Ineffective treatments:  None tried Associated symptoms: anxiety   Associated symptoms: no chest pain and no headaches   Risk factors: hx of mental illness        Past Medical History:  Diagnosis Date  . Diabetes mellitus without complication [facility])     Patient Active Problem List   Diagnosis Date Noted  . DKA (diabetic ketoacidoses) (HCC) 03/04/2020  . Polycythemia 03/04/2020  . AKI (acute kidney injury) (HCC) 03/04/2020  . Emesis 03/04/2020  . MDD (major depressive disorder), recurrent, severe, with psychosis (HCC)     History reviewed. No pertinent surgical history.     No family history on file.  Social History   Tobacco Use  . Smoking status: Never Smoker  . Smokeless tobacco: Never Used  Vaping Use  . Vaping Use: Never used  Substance Use Topics  . Alcohol use: No  . Drug use: Not Currently    Home Medications Prior to Admission medications   Medication Sig Start Date End Date Taking? Authorizing Provider  LEVEMIR FLEXTOUCH 100 UNIT/ML FlexPen Inject 20 Units into the skin 2 (two) times daily.  01/01/20  Yes [provider]  benztropine (COGENTIN) 0.5 MG tablet Take 1 tablet (0.5 mg total) by mouth 2 (two) times  daily. Patient not taking: Reported on 03/03/2020 11/22/19   Malvin Johns, MD  carbamazepine (TEGRETOL) 100 MG chewable tablet 1 in am 2 at hs Patient not taking: Reported on 03/03/2020 11/22/19   Malvin Johns, MD  glipiZIDE (GLUCOTROL XL) 10 MG 24 hr tablet Take 10 mg by mouth daily. 04/30/20   [provider]  Insulin Pen Needle (PEN NEEDLES) 32G X 4 MM MISC 1 application by Does not apply route daily. 11/07/18   Shade Flood, MD  risperiDONE (RISPERDAL) 3 MG tablet Take 2 tablets (6 mg total) by mouth at bedtime. Patient not taking: Reported on 03/03/2020 11/22/19   Malvin Johns, MD  traZODone (DESYREL) 150 MG tablet Take 1 tablet (150 mg total) by mouth at bedtime as needed for sleep. Patient not taking: Reported on 03/03/2020 11/22/19   Malvin Johns, MD    Allergies    Patient has no known allergies.  Review of Systems   Review of Systems  Constitutional: Negative for chills and fever.  HENT: Negative for congestion and rhinorrhea.   Respiratory: Negative for cough and shortness of breath.   Cardiovascular: Negative for chest pain and palpitations.  Gastrointestinal: Negative for diarrhea, nausea and vomiting.  Genitourinary: Negative for difficulty urinating and dysuria.  Musculoskeletal: Negative for arthralgias and back pain.  Skin: Negative for color change and rash.  Neurological: Negative for light-headedness and headaches.  Psychiatric/Behavioral: Positive for behavioral problems and hallucinations. Negative for suicidal ideas. The patient is nervous/anxious.  Physical Exam Updated Vital Signs BP (!) 128/93 (BP Location: Left Arm)   Pulse 99   Temp 98.2 F (36.8 C) (Oral)   Resp 16   Ht 5\' 9"  (1.753 m)   Wt 59 kg   SpO2 97%   BMI 19.20 kg/m   Physical Exam Vitals and nursing note reviewed. Exam conducted with a chaperone present.  Constitutional:      General: He is not in acute distress.    Appearance: Normal appearance.  HENT:     Head: Normocephalic  and atraumatic.     Nose: No rhinorrhea.  Eyes:     General:        Right eye: No discharge.        Left eye: No discharge.     Conjunctiva/sclera: Conjunctivae normal.  Cardiovascular:     Rate and Rhythm: Normal rate and regular rhythm.  Pulmonary:     Effort: Pulmonary effort is normal.     Breath sounds: No stridor.  Abdominal:     General: Abdomen is flat. There is no distension.     Palpations: Abdomen is soft.  Musculoskeletal:        General: No deformity or signs of injury.  Skin:    General: Skin is warm and dry.  Neurological:     General: No focal deficit present.     Mental Status: He is alert. Mental status is at baseline.     Motor: No weakness.  Psychiatric:        Mood and Affect: Mood normal.        Behavior: Behavior normal.        Thought Content: Thought content normal.     ED Results / Procedures / Treatments   Labs (all labs ordered are listed, but only abnormal results are displayed) Labs Reviewed  COMPREHENSIVE METABOLIC PANEL - Abnormal; Notable for the following components:      Result Value   Glucose, Bld 261 (*)    Total Protein 8.2 (*)    All other components within normal limits  URINALYSIS, ROUTINE W REFLEX MICROSCOPIC - Abnormal; Notable for the following components:   Specific Gravity, Urine 1.035 (*)    Glucose, UA >=500 (*)    Ketones, ur 20 (*)    All other components within normal limits  CBG MONITORING, ED - Abnormal; Notable for the following components:   Glucose-Capillary 252 (*)    All other components within normal limits  SARS CORONAVIRUS 2 BY RT PCR (HOSPITAL ORDER, PERFORMED IN [facility])  ETHANOL  RAPID URINE DRUG SCREEN, HOSP PERFORMED  CBC WITH DIFFERENTIAL/PLATELET    EKG EKG Interpretation  Date/Time:  Thursday June 05 2020 12:07:36 EDT Ventricular Rate:  88 PR Interval:    QRS Duration: 85 QT Interval:  343 QTC Calculation: 415 R Axis:   81 Text Interpretation: Sinus rhythm Right  atrial enlargement ST elev, probable normal early repol pattern 12 Lead; Mason-Likar Early repolarization Confirmed by 11-18-1990 (Cherlynn Perches) on 06/05/2020 1:04:48 PM   Radiology No results found.  Procedures Procedures (including critical care time)  Medications Ordered in ED Medications  carbamazepine (TEGRETOL) tablet 200 mg (200 mg Oral Given 06/05/20 1451)  risperiDONE (RISPERDAL) tablet 0.5 mg (0.5 mg Oral Given 06/05/20 1451)  traZODone (DESYREL) tablet 50 mg (has no administration in time range)  benztropine (COGENTIN) tablet 0.5 mg (0.5 mg Oral Given 06/05/20 1451)  insulin detemir (LEVEMIR) injection 20 Units (has no administration in time range)  glipiZIDE (  GLUCOTROL XL) 24 hr tablet 10 mg (has no administration in time range)    ED Course  I have reviewed the triage vital signs and the nursing notes.  Pertinent labs & imaging results that were available during my care of the patient were reviewed by me and considered in my medical decision making (see chart for details).    MDM Rules/Calculators/A&P                          29 year old male with history of type 1 diabetes brought in by law enforcement under IVC for abnormal behavior hallucinations and threats of violence towards others.  The patient denies all of this.  He states he has no hallucinations or talked stimuli and is not there he is just thinking out loud.  He denies threatening his family.  Will enforcement says he has been cooperative in they witnessed him such behavior.  Upon arrival he is hemodynamically stable he is afebrile.  We will evaluate him for complications related to his type 1 diabetes though he states he controls this well with his home medications.  He states he is on no medications for mood disorder, he says he likes to just walk and talk out loud to deal with his mood disorder.  He has a history of mental health admission for noncompliance and similar behavior, diagnosed with major mood disorder with  psychotic features.  First exam is completed by me IVC paperwork is in place at bedside and law enforcement will continue to watch the patient.  Patient has not DKA, has mild hyperglycemia, no acidosis.  He is medically cleared, psychiatry has seen and began their evaluation but has not given a final plan yet.  We will order his home meds regarding his diabetes control we will wait for psychiatry to evaluate for need of medications. Pharm tech to do med rec.  Medically cleared, no other lab work needed other than routine glucose checks, home medications ordered.  Awaiting psychiatric placement.  Final Clinical Impression(s) / ED Diagnoses Final diagnoses:  Hallucinations    Rx / DC Orders ED Discharge Orders    None       Sabino Donovan, MD 06/05/20 [id number]

## 2020-06-05 NOTE — ED Notes (Signed)
Pt.  Belongings collected. Wanded by security.

## 2020-06-05 NOTE — Progress Notes (Signed)
Received report from Hartrandt, RN at [facility] on pt coming to [facility]. It was discussed that pt CBG was 4.5 at 1821. After discussion with the [facility], it was made known that pt must have a CBG less than 300 for 24 hours before transport to our facility. RN called back and this information relayed.

## 2020-06-06 ENCOUNTER — Other Ambulatory Visit: Payer: Self-pay

## 2020-06-06 ENCOUNTER — Inpatient Hospital Stay (HOSPITAL_COMMUNITY)
Admission: AD | Admit: 2020-06-06 | Discharge: 2020-06-12 | DRG: 885 | Disposition: A | Discharge status: Home / Self Care | Payer: Medicaid Other | Attending: Psychiatry | Admitting: Psychiatry

## 2020-06-06 ENCOUNTER — Encounter (HOSPITAL_COMMUNITY): Payer: Self-pay

## 2020-06-06 DIAGNOSIS — Z915 Personal history of self-harm: Secondary | ICD-10-CM

## 2020-06-06 DIAGNOSIS — Z794 Long term (current) use of insulin: Secondary | ICD-10-CM | POA: Diagnosis not present

## 2020-06-06 DIAGNOSIS — F333 Major depressive disorder, recurrent, severe with psychotic symptoms: Principal | ICD-10-CM | POA: Diagnosis present

## 2020-06-06 DIAGNOSIS — IMO0002 Reserved for concepts with insufficient information to code with codable children: Secondary | ICD-10-CM

## 2020-06-06 DIAGNOSIS — Z9114 Patient's other noncompliance with medication regimen: Secondary | ICD-10-CM

## 2020-06-06 DIAGNOSIS — Z20822 Contact with and (suspected) exposure to covid-19: Secondary | ICD-10-CM | POA: Diagnosis present

## 2020-06-06 DIAGNOSIS — I10 Essential (primary) hypertension: Secondary | ICD-10-CM | POA: Diagnosis present

## 2020-06-06 DIAGNOSIS — E119 Type 2 diabetes mellitus without complications: Secondary | ICD-10-CM | POA: Diagnosis present

## 2020-06-06 DIAGNOSIS — E109 Type 1 diabetes mellitus without complications: Secondary | ICD-10-CM

## 2020-06-06 DIAGNOSIS — G8929 Other chronic pain: Secondary | ICD-10-CM | POA: Diagnosis present

## 2020-06-06 DIAGNOSIS — E10649 Type 1 diabetes mellitus with hypoglycemia without coma: Secondary | ICD-10-CM | POA: Diagnosis present

## 2020-06-06 DIAGNOSIS — E1165 Type 2 diabetes mellitus with hyperglycemia: Secondary | ICD-10-CM

## 2020-06-06 DIAGNOSIS — F29 Unspecified psychosis not due to a substance or known physiological condition: Secondary | ICD-10-CM | POA: Diagnosis present

## 2020-06-06 DIAGNOSIS — G4700 Insomnia, unspecified: Secondary | ICD-10-CM | POA: Diagnosis present

## 2020-06-06 DIAGNOSIS — Z79899 Other long term (current) drug therapy: Secondary | ICD-10-CM

## 2020-06-06 DIAGNOSIS — E1065 Type 1 diabetes mellitus with hyperglycemia: Secondary | ICD-10-CM | POA: Diagnosis present

## 2020-06-06 DIAGNOSIS — R456 Violent behavior: Secondary | ICD-10-CM | POA: Diagnosis not present

## 2020-06-06 LAB — CBG MONITORING, ED: Glucose-Capillary: 99 mg/dL (ref 70–99)

## 2020-06-06 LAB — GLUCOSE, CAPILLARY: Glucose-Capillary: 358 mg/dL — ABNORMAL HIGH (ref 70–99)

## 2020-06-06 MED ORDER — TRAZODONE HCL 50 MG PO TABS: 50.0000 mg | ORAL_TABLET | Freq: Every evening | ORAL | Status: DC | PRN

## 2020-06-06 MED ORDER — BENZTROPINE MESYLATE 0.5 MG PO TABS
0.5000 mg | ORAL_TABLET | Freq: Two times a day (BID) | ORAL | Status: DC | PRN
  Administered 2020-06-06: 0.5 mg via ORAL

## 2020-06-06 MED ORDER — PALIPERIDONE ER 3 MG PO TB24: 3.0000 mg | ORAL_TABLET | Freq: Every day | ORAL | Status: DC

## 2020-06-06 MED ORDER — MAGNESIUM HYDROXIDE 400 MG/5ML PO SUSP: 30.0000 mL | Freq: Every day | ORAL | Status: DC | PRN

## 2020-06-06 MED ORDER — GLIPIZIDE ER 5 MG PO TB24
10.0000 mg | ORAL_TABLET | Freq: Every day | ORAL | Status: DC
  Administered 2020-06-07 – 2020-06-12 (×6): 10 mg via ORAL
  Filled 2020-06-06 (×2): qty 2
  Filled 2020-06-06: qty 1
  Filled 2020-06-06 (×4): qty 2

## 2020-06-06 MED ORDER — BENZTROPINE MESYLATE 0.5 MG PO TABS
0.5000 mg | ORAL_TABLET | Freq: Two times a day (BID) | ORAL | Status: DC
  Filled 2020-06-06 (×4): qty 1

## 2020-06-06 MED ORDER — ACETAMINOPHEN 325 MG PO TABS: 650.0000 mg | ORAL_TABLET | Freq: Four times a day (QID) | ORAL | Status: DC | PRN

## 2020-06-06 MED ORDER — TRAZODONE HCL 50 MG PO TABS
50.0000 mg | ORAL_TABLET | Freq: Once | ORAL | Status: AC
  Administered 2020-06-06: 50 mg via ORAL
  Filled 2020-06-06: qty 1

## 2020-06-06 MED ORDER — CARBAMAZEPINE 200 MG PO TABS
200.0000 mg | ORAL_TABLET | Freq: Two times a day (BID) | ORAL | Status: DC
  Filled 2020-06-06 (×4): qty 1

## 2020-06-06 MED ORDER — TRAZODONE HCL 50 MG PO TABS
50.0000 mg | ORAL_TABLET | Freq: Every evening | ORAL | Status: DC | PRN
  Administered 2020-06-06 – 2020-06-08 (×3): 50 mg via ORAL
  Filled 2020-06-06 (×4): qty 1

## 2020-06-06 MED ORDER — PALIPERIDONE ER 6 MG PO TB24
6.0000 mg | ORAL_TABLET | Freq: Every day | ORAL | Status: DC
  Administered 2020-06-06 – 2020-06-11 (×6): 6 mg via ORAL
  Filled 2020-06-06 (×8): qty 1

## 2020-06-06 MED ORDER — HYDROXYZINE HCL 25 MG PO TABS
25.0000 mg | ORAL_TABLET | Freq: Three times a day (TID) | ORAL | Status: DC | PRN
  Administered 2020-06-06 – 2020-06-11 (×5): 25 mg via ORAL
  Filled 2020-06-06 (×5): qty 1

## 2020-06-06 MED ORDER — ALUM & MAG HYDROXIDE-SIMETH 200-200-20 MG/5ML PO SUSP: 30.0000 mL | ORAL | Status: DC | PRN

## 2020-06-06 MED ORDER — INSULIN DETEMIR 100 UNIT/ML ~~LOC~~ SOLN
20.0000 [IU] | Freq: Two times a day (BID) | SUBCUTANEOUS | Status: DC
  Administered 2020-06-06 – 2020-06-09 (×7): 20 [IU] via SUBCUTANEOUS
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
## 2020-06-06 NOTE — ED Notes (Signed)
Report called to Elizabeth RN at [facility]. 

## 2020-06-06 NOTE — Progress Notes (Signed)
   06/06/20 2000  Psych Admission Type (Psych Patients Only)  Admission Status Involuntary  Psychosocial Assessment  Patient Complaints Anxiety  Eye Contact Brief  Facial Expression Anxious  Affect Anxious  Speech Logical/coherent  Interaction Assertive  Motor Activity Fidgety;Restless  Appearance/Hygiene In scrubs  Behavior Characteristics Appropriate to situation;Anxious  Mood Anxious  Thought Process  Coherency Concrete thinking  Content Preoccupation  Delusions None reported or observed  Perception UTA  Hallucination None reported or observed  Judgment Poor  Confusion None  Danger to Self  Current suicidal ideation? Denies  Danger to Others  Danger to Others None reported or observed

## 2020-06-06 NOTE — ED Notes (Signed)
[facility] has been called to transport pt to [facility].

## 2020-06-06 NOTE — Tx Team (Signed)
Initial Treatment Plan 06/06/2020 12:39 PM Jacob [name] [id number]    PATIENT STRESSORS: Financial difficulties Health problems Marital or family conflict Medication change or noncompliance   PATIENT STRENGTHS: Motivation for treatment/growth Supportive family/friends   PATIENT IDENTIFIED PROBLEMS: "Stability"  "To control my environment"  Paranoia  Medication noncompliance               DISCHARGE CRITERIA:  Ability to meet basic life and health needs Safe-care adequate arrangements made  PRELIMINARY DISCHARGE PLAN: Attend aftercare/continuing care group Outpatient therapy  PATIENT/FAMILY INVOLVEMENT: This treatment plan has been presented to and reviewed with the patient, Jacob [name], and/or family member.  The patient and family have been given the opportunity to ask questions and make suggestions.  Clarene Critchley, RN 06/06/2020, 12:39 PM
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
## 2020-06-06 NOTE — Progress Notes (Signed)
   06/06/20 1945  COVID-19 Daily Checkoff  Have you had a fever (temp > 37.80C/100F)  in the past 24 hours?  No  If you have had runny nose, nasal congestion, sneezing in the past 24 hours, has it worsened? No  COVID-19 EXPOSURE  Have you traveled outside the state in the past 14 days? No  Have you been in contact with someone with a confirmed diagnosis of COVID-19 or PUI in the past 14 days without wearing appropriate PPE? No  Have you been living in the same home as a person with confirmed diagnosis of COVID-19 or a PUI (household contact)? No  Have you been diagnosed with COVID-19? No

## 2020-06-07 LAB — GLUCOSE, CAPILLARY
Glucose-Capillary: 152 mg/dL — ABNORMAL HIGH (ref 70–99)
Glucose-Capillary: 399 mg/dL — ABNORMAL HIGH (ref 70–99)

## 2020-06-07 MED ORDER — BENZTROPINE MESYLATE 1 MG PO TABS: 1.0000 mg | ORAL_TABLET | Freq: Two times a day (BID) | ORAL | Status: DC | PRN

## 2020-06-07 MED ORDER — PALIPERIDONE PALMITATE ER 234 MG/1.5ML IM SUSY
234.0000 mg | PREFILLED_SYRINGE | Freq: Once | INTRAMUSCULAR | Status: AC
  Administered 2020-06-08: 234 mg via INTRAMUSCULAR
  Filled 2020-06-07: qty 1.5
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
## 2020-06-07 NOTE — Plan of Care (Signed)
Progress note  D: pt found in the hallway pacing; compliant with medication administration. Pt continues to be intrusive but redirectable. Pt is animated but vague/guarded with assessment. Pt states "it's all relative" when the pt is asked about auditory hallucinations. Pt continues to pace and is restless. Pt denies si/hi/vh and verbally agrees to approach staff if these become apparent or before harming themself/others while at [facility].  A: Pt provided support and encouragement. Pt given medication per protocol and standing orders. Q38m safety checks implemented and continued.  R: Pt safe on the unit. Will continue to monitor.  Pt progressing in the following metrics  Problem: Education: Goal: Knowledge of [place] [facility] Education information/materials will improve Outcome: Progressing   Problem: Coping: Goal: Ability to identify and develop effective coping behavior will improve Outcome: Progressing   Problem: Self-Concept: Goal: Ability to identify factors that promote anxiety will improve Outcome: Progressing Goal: Level of anxiety will decrease Outcome: Progressing

## 2020-06-07 NOTE — Progress Notes (Signed)
   06/07/20 2100  COVID-19 Daily Checkoff  Have you had a fever (temp > 37.80C/100F)  in the past 24 hours?  No  If you have had runny nose, nasal congestion, sneezing in the past 24 hours, has it worsened? No  COVID-19 EXPOSURE  Have you traveled outside the state in the past 14 days? No  Have you been in contact with someone with a confirmed diagnosis of COVID-19 or PUI in the past 14 days without wearing appropriate PPE? No  Have you been living in the same home as a person with confirmed diagnosis of COVID-19 or a PUI (household contact)? No  Have you been diagnosed with COVID-19? No

## 2020-06-07 NOTE — Progress Notes (Signed)
Patient has been anxious pacing up and down the hallway but pleasant with staff. Safety maintained with 15 min checks

## 2020-06-07 NOTE — Progress Notes (Signed)
Adult Psychoeducational Group Note  Date:  06/07/2020 Time:  10:22 PM  Group Topic/Focus:  Wrap-Up Group:   The focus of this group is to help patients review their daily goal of treatment and discuss progress on daily workbooks.  Participation Level:  Minimal  Participation Quality:  Appropriate  Affect:  Appropriate  Cognitive:  Disorganized  Insight: Limited  Engagement in Group:  Limited  Modes of Intervention:  Discussion  Additional Comments: Pt stated his goal for today was to focus on his treatment plan. Pt stated he felt he accomplished his goal today. Pt stated he talk with his doctor, regarding his care today. Pt stated he took all his medication today from his providers. Pt rated his overall day a 8 out of 10. Pt stated his relationship with his family needs to be improved. Pt stated he contact his mother and grandmother today. Pt stated his appetite was pretty good today and he attended all meals. Pt stated his sleep last night was pretty good. Pt stated he was in no physical pain. Pt deny auditory or visual hallucinations. Pt denies thoughts of harming himself or others. Pt stated he would alert staff if anything changes.   Felipa Furnace 06/07/2020, 10:22 PM

## 2020-06-08 LAB — GLUCOSE, CAPILLARY
Glucose-Capillary: 140 mg/dL — ABNORMAL HIGH (ref 70–99)
Glucose-Capillary: 431 mg/dL — ABNORMAL HIGH (ref 70–99)

## 2020-06-08 LAB — HEMOGLOBIN A1C
Hgb A1c MFr Bld: 11.9 % — ABNORMAL HIGH (ref 4.8–5.6)
Mean Plasma Glucose: 294.83 mg/dL

## 2020-06-08 MED ORDER — GABAPENTIN 300 MG PO CAPS
300.0000 mg | ORAL_CAPSULE | Freq: Three times a day (TID) | ORAL | Status: DC
  Administered 2020-06-08 – 2020-06-12 (×13): 300 mg via ORAL
  Filled 2020-06-08 (×17): qty 1

## 2020-06-08 MED ORDER — PALIPERIDONE PALMITATE ER 156 MG/ML IM SUSY
156.0000 mg | PREFILLED_SYRINGE | Freq: Once | INTRAMUSCULAR | Status: AC
  Administered 2020-06-12: 156 mg via INTRAMUSCULAR
  Filled 2020-06-08: qty 1
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
## 2020-06-08 NOTE — Plan of Care (Signed)
Progress note  D: pt found in the hallway pacing; compliant with morning medication administration. Pt also requested their long acting injectable. Pt has complaints of the bottom of their feet hurting from playing basketball yesterday. Pt was instructed that adequate periods of rest are needed. No evidence of learning. "I can't sit down". Pt shows appropriate understanding of their disease process regarding the necessity of proper diet and medication for DM II. "I don't have the right foods here to manage my blood sugar". Pt continues to be intrusive and watchful. Pt does laugh inappropriately at times but still denies avh. Pt denies si/hi/ah/vh and verbally agrees to approach staff if these become apparent or before harming themself/others while at [facility].  A: Pt provided support and encouragement. Pt given medication per protocol and standing orders. Q86m safety checks implemented and continued.  R: Pt safe on the unit. Will continue to monitor.  Pt progressing in the following metrics  Problem: Self-Concept: Goal: Ability to modify response to factors that promote anxiety will improve Outcome: Progressing   Problem: Coping: Goal: Coping ability will improve Outcome: Progressing Goal: Will verbalize feelings Outcome: Progressing   Problem: Health Behavior/Discharge Planning: Goal: Compliance with prescribed medication regimen will improve Outcome: Progressing

## 2020-06-08 NOTE — Progress Notes (Signed)
Adult Psychoeducational Group Note  Date:  06/08/2020 Time:  10:20 PM  Group Topic/Focus:  Wrap-Up Group:   The focus of this group is to help patients review their daily goal of treatment and discuss progress on daily workbooks.  Participation Level:  Minimal  Participation Quality:  Appropriate  Affect:  Appropriate  Cognitive:  Appropriate  Insight: Appropriate  Engagement in Group:  Developing/Improving  Modes of Intervention:  Discussion  Additional Comments:  Pt stated his goals for today was to focus on his treatment plan and interact with his peers more today.. Pt stated he felt he accomplished his goals today. Pt stated he talk with his doctor and social worker, regarding his care today. Pt stated he took all his medication today from his providers. Pt rated his overall day a 10. Pt stated his relationship with his family has improved since he was admitted here. Pt stated he was able to contact his grandmother today, which improved his day. Pt stated his appetite was pretty good today and he attended all meals. Pt stated his sleep last night was pretty good. Pt stated he was in no physical pain. Pt deny auditory or visual hallucinations. Pt denies thoughts of harming himself or others. Pt stated he would alert staff if anything changes  Jacob [name] 06/08/2020, 10:20 PM

## 2020-06-08 NOTE — Progress Notes (Signed)
   06/08/20 1959  COVID-19 Daily Checkoff  Have you had a fever (temp > 37.80C/100F)  in the past 24 hours?  No  If you have had runny nose, nasal congestion, sneezing in the past 24 hours, has it worsened? No  COVID-19 EXPOSURE  Have you traveled outside the state in the past 14 days? No  Have you been in contact with someone with a confirmed diagnosis of COVID-19 or PUI in the past 14 days without wearing appropriate PPE? No  Have you been living in the same home as a person with confirmed diagnosis of COVID-19 or a PUI (household contact)? No  Have you been diagnosed with COVID-19? No
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
## 2020-06-08 NOTE — Progress Notes (Signed)
Patient has been up walking the hallway off and on talking with writer about his discharge and things he needs to take care of once discharged. He remains anxious but pleasant. Support given and safety maintained with 15 min checks.

## 2020-06-08 NOTE — [facility] Suicide Risk Assessment (Signed)
[facility] INPATIENT:  Family/Significant Other Suicide Prevention Education  Suicide Prevention Education:  Patient Refusal for Family/Significant Other Suicide Prevention Education: The patient Jacob [name] has refused to provide written consent for family/significant other to be provided Family/Significant Other Suicide Prevention Education during admission and/or prior to discharge. SPE information provided to the patient.  Physician notified.  Evorn Gong 06/08/2020, 10:34 AM

## 2020-06-09 DIAGNOSIS — E1165 Type 2 diabetes mellitus with hyperglycemia: Secondary | ICD-10-CM

## 2020-06-09 DIAGNOSIS — F333 Major depressive disorder, recurrent, severe with psychotic symptoms: Principal | ICD-10-CM

## 2020-06-09 DIAGNOSIS — IMO0002 Reserved for concepts with insufficient information to code with codable children: Secondary | ICD-10-CM

## 2020-06-09 LAB — GLUCOSE, CAPILLARY
Glucose-Capillary: 203 mg/dL — ABNORMAL HIGH (ref 70–99)
Glucose-Capillary: 35 mg/dL — CL (ref 70–99)
Glucose-Capillary: 430 mg/dL — ABNORMAL HIGH (ref 70–99)
Glucose-Capillary: 483 mg/dL — ABNORMAL HIGH (ref 70–99)
Glucose-Capillary: 509 mg/dL (ref 70–99)

## 2020-06-09 MED ORDER — INSULIN ASPART 100 UNIT/ML ~~LOC~~ SOLN
10.0000 [IU] | Freq: Once | SUBCUTANEOUS | Status: AC
  Administered 2020-06-09: 10 [IU] via SUBCUTANEOUS

## 2020-06-09 MED ORDER — INSULIN DETEMIR 100 UNIT/ML ~~LOC~~ SOLN: 26.0000 [IU] | Freq: Two times a day (BID) | SUBCUTANEOUS | Status: DC

## 2020-06-09 MED ORDER — INSULIN ASPART 100 UNIT/ML ~~LOC~~ SOLN
7.0000 [IU] | Freq: Once | SUBCUTANEOUS | Status: AC
  Administered 2020-06-09: 7 [IU] via SUBCUTANEOUS

## 2020-06-09 MED ORDER — INSULIN ASPART 100 UNIT/ML ~~LOC~~ SOLN: 0.0000 [IU] | Freq: Every day | SUBCUTANEOUS | Status: DC

## 2020-06-09 MED ORDER — INSULIN ASPART 100 UNIT/ML ~~LOC~~ SOLN
0.0000 [IU] | Freq: Three times a day (TID) | SUBCUTANEOUS | Status: DC
  Administered 2020-06-10: 3 [IU] via SUBCUTANEOUS
  Administered 2020-06-10: 5 [IU] via SUBCUTANEOUS
  Administered 2020-06-10: 2 [IU] via SUBCUTANEOUS
  Administered 2020-06-11: 7 [IU] via SUBCUTANEOUS
  Administered 2020-06-12: 5 [IU] via SUBCUTANEOUS

## 2020-06-09 MED ORDER — INSULIN DETEMIR 100 UNIT/ML ~~LOC~~ SOLN
25.0000 [IU] | Freq: Two times a day (BID) | SUBCUTANEOUS | Status: DC
  Administered 2020-06-10: 25 [IU] via SUBCUTANEOUS

## 2020-06-09 MED ORDER — INSULIN ASPART 100 UNIT/ML ~~LOC~~ SOLN
0.0000 [IU] | Freq: Three times a day (TID) | SUBCUTANEOUS | Status: DC
Start: 2020-06-10 — End: 2020-06-09
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
## 2020-06-09 NOTE — Progress Notes (Signed)
Inpatient Diabetes Program Recommendations  AACE/ADA: New Consensus Statement on Inpatient Glycemic Control (2015)  Target Ranges:  Prepandial:   less than 140 mg/dL      Peak postprandial:   less than 180 mg/dL (1-2 hours)      Critically ill patients:  140 - 180 mg/dL   Lab Results  Component Value Date   GLUCAP 203 (H) 06/09/2020   HGBA1C 11.9 (H) 06/08/2020    Review of Glycemic Control  Diabetes history: DM1? Outpatient Diabetes medications: Levemir 20 units bid, glipizide 10 mg QAM Current orders for Inpatient glycemic control: Levemir 20 units bid, glipizide 10 mg QAM  Very labile blood sugars.  Inpatient Diabetes Program Recommendations:     Add Novolog 0-9 units tidwc.  Will continue to follow glucose trends.   Thank you. Ailene Ards, RD, LDN, CDE Inpatient Diabetes Coordinator [phone]

## 2020-06-09 NOTE — Progress Notes (Signed)
   06/09/20 2200  Psych Admission Type (Psych Patients Only)  Admission Status Involuntary  Psychosocial Assessment  Patient Complaints Anxiety  Eye Contact Fair  Facial Expression Animated;Anxious  Affect Anxious;Preoccupied  Speech Logical/coherent  Interaction Assertive;Intrusive  Motor Activity Fidgety;Pacing;Restless  Appearance/Hygiene Disheveled;In scrubs  Behavior Characteristics Appropriate to situation  Mood Pleasant  Thought Process  Coherency Concrete thinking  Content Blaming others;Obsessions  Delusions Controlled;Persecutory  Perception Hallucinations  Hallucination Auditory  Judgment Poor  Confusion None  Danger to Self  Current suicidal ideation? Denies  Danger to Others  Danger to Others None reported or observed   

## 2020-06-09 NOTE — Progress Notes (Signed)
Recreation Therapy Notes  Date: 8.9.21 Time: 1000 Location: 500 Hall Dayroom   Group Topic: Communication, Team Building, Problem Solving  Goal Area(s) Addresses:  Patient will effectively work with peer towards shared goal.  Patient will identify skills used to make activity successful.  Patient will identify how skills used during activity can be used to reach post d/c goals.   Behavioral Response: Engaged  Intervention: STEM Activity  Activity: Stage manager. In teams patients were given 12 plastic drinking straws and a length of masking tape. Using the materials provided patients were asked to build a landing pad to catch a golf ball dropped from approximately 6 feet in the air.   Education: Pharmacist, community, Discharge Planning   Education Outcome: Acknowledges education/In group clarification offered/Needs additional education.   Clinical Observations/Feedback:  Pt worked well with peer to come up with an idea for a contraption.  Pt and peer worked well together but was unable to build a contraption that worked.  Pt wanted extra supplies but that request was denied because they could only use what was given to them.    Jacob [name], LRT/CTRS     Lillia Abed, Kazi Reppond A 06/09/2020 11:07 AM

## 2020-06-09 NOTE — BHH Counselor (Signed)
CSW left HIPAA compliant voicemail for patients probation officer, Erie Noe [phone]). Per Ms. Pittman's voicemail she will be out of the office until 06/10/2020.   CSW will attempt to reach Ms. Pittman again on 06/10/20.    Ruthann Cancer MSW, LCSW Clincal Social Worker  [facility]

## 2020-06-09 NOTE — Progress Notes (Signed)
   06/09/20 1000  Psych Admission Type (Psych Patients Only)  Admission Status Involuntary  Psychosocial Assessment  Patient Complaints Anxiety  Eye Contact Fair  Facial Expression Animated;Anxious  Affect Anxious;Preoccupied  Speech Logical/coherent  Interaction Assertive;Intrusive  Motor Activity Fidgety;Pacing;Restless  Appearance/Hygiene Disheveled;In scrubs  Behavior Characteristics Appropriate to situation  Mood Pleasant  Thought Process  Coherency Concrete thinking  Content Blaming others;Obsessions  Delusions Controlled;Persecutory  Perception Hallucinations  Hallucination Auditory  Judgment Poor  Confusion None  Danger to Self  Current suicidal ideation? Denies  Danger to Others  Danger to Others None reported or observed
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
## 2020-06-09 NOTE — Tx Team (Cosign Needed)
Interdisciplinary Treatment and Diagnostic Plan Update  06/09/2020 Time of Session:416p Jacob [name] MRN: [id number]  Principal Diagnosis: MDD (major depressive disorder), recurrent, severe, with psychosis ([place])  Secondary Diagnoses: Principal Problem:   MDD (major depressive disorder), recurrent, severe, with psychosis ([place]) Active Problems:   Insulin dependent diabetes mellitus type IA ([place])   Current Medications:  Current Facility-Administered Medications  Medication Dose Route Frequency Provider Last Rate Last Admin  . acetaminophen (TYLENOL) tablet 650 mg  650 mg Oral Q6H PRN Emmaline Kluver, FNP      . alum & mag hydroxide-simeth (MAALOX/MYLANTA) 200-200-20 MG/5ML suspension 30 mL  30 mL Oral Q4H PRN Emmaline Kluver, FNP      . benztropine (COGENTIN) tablet 1 mg  1 mg Oral BID PRN Lavella Hammock, MD      . gabapentin (NEURONTIN) capsule 300 mg  300 mg Oral TID Lavella Hammock, MD   300 mg at 06/09/20 1131  . glipiZIDE (GLUCOTROL XL) 24 hr tablet 10 mg  10 mg Oral Q breakfast Emmaline Kluver, FNP   10 mg at 06/09/20 0931  . hydrOXYzine (ATARAX/VISTARIL) tablet 25 mg  25 mg Oral TID PRN Emmaline Kluver, FNP   25 mg at 06/08/20 2052  . insulin aspart (novoLOG) injection 0-9 Units  0-9 Units Subcutaneous TID WC Connye Burkitt, NP      . insulin detemir (LEVEMIR) injection 20 Units  20 Units Subcutaneous BID Emmaline Kluver, FNP   20 Units at 06/09/20 0932  . magnesium hydroxide (MILK OF MAGNESIA) suspension 30 mL  30 mL Oral Daily PRN Emmaline Kluver, FNP      . [START ON 06/12/2020] paliperidone (INVEGA SUSTENNA) injection 156 mg  156 mg Intramuscular Once Lavella Hammock, MD      . paliperidone (INVEGA) 24 hr tablet 6 mg  6 mg Oral QHS Lavella Hammock, MD   6 mg at 06/08/20 2052  . traZODone (DESYREL) tablet 50 mg  50 mg Oral QHS PRN,MR X 1 Lavella Hammock, MD   50 mg at 06/08/20 2052   PTA Medications: Medications Prior to Admission  Medication Sig Dispense Refill Last Dose  . benztropine  (COGENTIN) 0.5 MG tablet Take 1 tablet (0.5 mg total) by mouth 2 (two) times daily. (Patient not taking: Reported on 03/03/2020) 60 tablet 2   . carbamazepine (TEGRETOL) 100 MG chewable tablet 1 in am 2 at hs (Patient not taking: Reported on 03/03/2020) 90 tablet 2   . glipiZIDE (GLUCOTROL XL) 10 MG 24 hr tablet Take 10 mg by mouth daily.     . Insulin Pen Needle (PEN NEEDLES) 32G X 4 MM MISC 1 application by Does not apply route daily. 50 each 0   . LEVEMIR FLEXTOUCH 100 UNIT/ML FlexPen Inject 20 Units into the skin 2 (two) times daily.      . risperiDONE (RISPERDAL) 3 MG tablet Take 2 tablets (6 mg total) by mouth at bedtime. (Patient not taking: Reported on 03/03/2020) 60 tablet 2   . traZODone (DESYREL) 150 MG tablet Take 1 tablet (150 mg total) by mouth at bedtime as needed for sleep. (Patient not taking: Reported on 03/03/2020) 90 tablet 1     Patient Stressors: Financial difficulties Health problems Marital or family conflict Medication change or noncompliance  Patient Strengths: Motivation for treatment/growth Supportive family/friends  Treatment Modalities: Medication Management, Group therapy, Case management,  1 to 1 session with clinician, Psychoeducation, Recreational therapy.   Physician Treatment Plan for  Primary Diagnosis: MDD (major depressive disorder), recurrent, severe, with psychosis ([place]) Long Term Goal(s): Improvement in symptoms so as ready for discharge Improvement in symptoms so as ready for discharge   Short Term Goals: Ability to identify changes in lifestyle to reduce recurrence of condition will improve Ability to demonstrate self-control will improve Ability to identify and develop effective coping behaviors will improve Ability to maintain clinical measurements within normal limits will improve Ability to identify triggers associated with substance abuse/mental health issues will improve Ability to identify changes in lifestyle to reduce recurrence of condition  will improve Ability to demonstrate self-control will improve Ability to identify and develop effective coping behaviors will improve Ability to maintain clinical measurements within normal limits will improve Ability to identify triggers associated with substance abuse/mental health issues will improve  Medication Management: Evaluate patient's response, side effects, and tolerance of medication regimen.  Therapeutic Interventions: 1 to 1 sessions, Unit Group sessions and Medication administration.  Evaluation of Outcomes: Not Met  Physician Treatment Plan for Secondary Diagnosis: Principal Problem:   MDD (major depressive disorder), recurrent, severe, with psychosis ([place]) Active Problems:   Insulin dependent diabetes mellitus type IA ([place])  Long Term Goal(s): Improvement in symptoms so as ready for discharge Improvement in symptoms so as ready for discharge   Short Term Goals: Ability to identify changes in lifestyle to reduce recurrence of condition will improve Ability to demonstrate self-control will improve Ability to identify and develop effective coping behaviors will improve Ability to maintain clinical measurements within normal limits will improve Ability to identify triggers associated with substance abuse/mental health issues will improve Ability to identify changes in lifestyle to reduce recurrence of condition will improve Ability to demonstrate self-control will improve Ability to identify and develop effective coping behaviors will improve Ability to maintain clinical measurements within normal limits will improve Ability to identify triggers associated with substance abuse/mental health issues will improve     Medication Management: Evaluate patient's response, side effects, and tolerance of medication regimen.  Therapeutic Interventions: 1 to 1 sessions, Unit Group sessions and Medication administration.  Evaluation of Outcomes: Not Met   RN Treatment Plan for  Primary Diagnosis: MDD (major depressive disorder), recurrent, severe, with psychosis ([place]) Long Term Goal(s): Knowledge of disease and therapeutic regimen to maintain health will improve  Short Term Goals: Ability to remain free from injury will improve, Ability to verbalize frustration and anger appropriately will improve, Ability to demonstrate self-control, Ability to participate in decision making will improve, Ability to verbalize feelings will improve, Ability to disclose and discuss suicidal ideas, Ability to identify and develop effective coping behaviors will improve and Compliance with prescribed medications will improve  Medication Management: RN will administer medications as ordered by provider, will assess and evaluate patient's response and provide education to patient for prescribed medication. RN will report any adverse and/or side effects to prescribing provider.  Therapeutic Interventions: 1 on 1 counseling sessions, Psychoeducation, Medication administration, Evaluate responses to treatment, Monitor vital signs and CBGs as ordered, Perform/monitor CIWA, COWS, AIMS and Fall Risk screenings as ordered, Perform wound care treatments as ordered.  Evaluation of Outcomes: Not Met   LCSW Treatment Plan for Primary Diagnosis: MDD (major depressive disorder), recurrent, severe, with psychosis ([place]) Long Term Goal(s): Safe transition to appropriate next level of care at discharge, Engage patient in therapeutic group addressing interpersonal concerns.  Short Term Goals: Engage patient in aftercare planning with referrals and resources, Increase social support, Increase ability to appropriately verbalize feelings, Increase emotional  regulation, Facilitate acceptance of mental health diagnosis and concerns, Facilitate patient progression through stages of change regarding substance use diagnoses and concerns, Identify triggers associated with mental health/substance abuse issues and Increase  skills for wellness and recovery  Therapeutic Interventions: Assess for all discharge needs, 1 to 1 time with Social worker, Explore available resources and support systems, Assess for adequacy in community support network, Educate family and significant other(s) on suicide prevention, Complete Psychosocial Assessment, Interpersonal group therapy.  Evaluation of Outcomes: Not Met   Progress in Treatment: Attending groups: Yes. Participating in groups: Yes. Taking medication as prescribed: Yes. Toleration medication: Yes. Family/Significant other contact made: No, will contact:  pt declined Patient understands diagnosis: Yes. Discussing patient identified problems/goals with staff: Yes. Medical problems stabilized or resolved: Yes. Denies suicidal/homicidal ideation: Yes. Issues/concerns per patient self-inventory: No. Other:   New problem(s) identified: No, Describe:  none  New Short Term/Long Term Goal(s):  Patient Goals:  Pt did not attend  Discharge Plan or Barriers:   Reason for Continuation of Hospitalization: Medication stabilization  Estimated Length of Stay:  Attendees: Patient: Pt did not attend 06/09/2020 4:16 PM  Physician:  06/09/2020 4:16 PM  Nursing:  06/09/2020 4:16 PM  RN Care Manager: 06/09/2020 4:16 PM  Social Worker:  Macon Large  06/09/2020 4:16 PM  Recreational Therapist:  06/09/2020 4:16 PM  Other:  06/09/2020 4:16 PM  Other:  06/09/2020 4:16 PM  Other: 06/09/2020 4:16 PM    Scribe for Treatment Team: Bethann Berkshire, LCSW 06/09/2020 4:16 PM

## 2020-06-09 NOTE — Progress Notes (Signed)
Pt recheck CBG 430 , per Dr Antionette Char give pt 1x dose 7 units of Novolog and do not give sliding scale, will continue to monitor.

## 2020-06-09 NOTE — Progress Notes (Signed)
Patient ID: Jacob [name], male   DOB: 04/19/1991, 29 y.o.   MRN: [id number]   Informed by Nursing Staff that patient's CBG is 483.  History of DM, which has been poorly controlled. 8/8  HgbA1C 11.9  Currently on Insulin Levemir 20 units [place]  BID and on Novolog Sliding Scale with meals . Also on Glucotrol 10 mgrs QDAY.   I have consulted hospitalist, who will make adjustments to diabetic medication regimen.  Have also ordered Diabetes Coordinator Consultation  Sallyanne Havers, MD

## 2020-06-09 NOTE — Progress Notes (Addendum)
Pt CBG 483 , notified Dr. Jama Flavors and he contacted Dr Antionette Char and he recommended 10 units Novolog and re check in 1 hour. And continue to monitor

## 2020-06-09 NOTE — Progress Notes (Signed)
Pt CBG was 483 at 1926 pt was given 10 units Novolog per Dr , pt CBG was 430 at 2057, Dr ordered 7 units 1x , pt CBG was 35 at 2340 , then Hypoglycemic protocol implemented and follow up CBG 70 , 135 , will continue to monitor    Hypoglycemic Event  CBG: 35  Treatment: 4 oz juice/soda and 8 oz juice/soda , peanut butter, milk  Symptoms: None  Follow-up CBG: Time:2355 CBG Result:70 Follow-up CBG: Time:0010 CBG Result: 135  Possible Reasons for Event: Medication regimen: insulin  Comments/MD notified:continue to monitor    Delos Haring

## 2020-06-10 LAB — GLUCOSE, CAPILLARY
Glucose-Capillary: 135 mg/dL — ABNORMAL HIGH (ref 70–99)
Glucose-Capillary: 178 mg/dL — ABNORMAL HIGH (ref 70–99)
Glucose-Capillary: 207 mg/dL — ABNORMAL HIGH (ref 70–99)
Glucose-Capillary: 222 mg/dL — ABNORMAL HIGH (ref 70–99)
Glucose-Capillary: 235 mg/dL — ABNORMAL HIGH (ref 70–99)
Glucose-Capillary: 286 mg/dL — ABNORMAL HIGH (ref 70–99)
Glucose-Capillary: 70 mg/dL (ref 70–99)

## 2020-06-10 LAB — BASIC METABOLIC PANEL
Anion gap: 9 (ref 5–15)
BUN: 16 mg/dL (ref 6–20)
CO2: 30 mmol/L (ref 22–32)
Calcium: 9.3 mg/dL (ref 8.9–10.3)
Chloride: 100 mmol/L (ref 98–111)
Creatinine, Ser: 0.8 mg/dL (ref 0.61–1.24)
GFR calc Af Amer: >60 mL/min (ref >60)
GFR calc non Af Amer: >60 mL/min (ref >60)
Glucose, Bld: 262 mg/dL — ABNORMAL HIGH (ref 70–99)
Potassium: 4.2 mmol/L (ref 3.5–5.1)
Sodium: 139 mmol/L (ref 135–145)

## 2020-06-10 MED ORDER — TRAZODONE HCL 100 MG PO TABS
100.0000 mg | ORAL_TABLET | Freq: Every evening | ORAL | Status: DC | PRN
  Administered 2020-06-10 – 2020-06-11 (×2): 100 mg via ORAL
  Filled 2020-06-10 (×2): qty 1

## 2020-06-10 MED ORDER — LISINOPRIL 5 MG PO TABS
5.0000 mg | ORAL_TABLET | Freq: Every day | ORAL | Status: DC
  Administered 2020-06-10 – 2020-06-11 (×2): 5 mg via ORAL
  Filled 2020-06-10 (×5): qty 1

## 2020-06-10 MED ORDER — INSULIN DETEMIR 100 UNIT/ML ~~LOC~~ SOLN
30.0000 [IU] | Freq: Two times a day (BID) | SUBCUTANEOUS | Status: DC
  Administered 2020-06-10 – 2020-06-11 (×2): 30 [IU] via SUBCUTANEOUS

## 2020-06-10 NOTE — Progress Notes (Signed)
Inpatient Diabetes Program Recommendations  AACE/ADA: New Consensus Statement on Inpatient Glycemic Control (2015)  Target Ranges:  Prepandial:   less than 140 mg/dL      Peak postprandial:   less than 180 mg/dL (1-2 hours)      Critically ill patients:  140 - 180 mg/dL   Lab Results  Component Value Date   GLUCAP 235 (H) 06/10/2020   HGBA1C 11.9 (H) 06/08/2020    Review of Glycemic Control  Diabetes history: DM2 Outpatient Diabetes medications: Levemir 20 units bid, glipizide 10 mg QAM Current orders for Inpatient glycemic control: Levemir 30 units bid, Novolog 0-9 units tidwc, glipizide 10 mg QAM  Post-prandials elevated. Would benefit from addition of meal coverage insulin  Inpatient Diabetes Program Recommendations:     Add Novolog 4 units tidwc for meal coverage insulin.  May need to decrease Levemir back to 25 units bid to avoid hypoglycemia.  Will follow.  Thank you. Ailene Ards, RD, LDN, CDE Inpatient Diabetes Coordinator [phone]

## 2020-06-10 NOTE — Progress Notes (Signed)
   06/10/20 2300  Psych Admission Type (Psych Patients Only)  Admission Status Involuntary  Psychosocial Assessment  Patient Complaints Anxiety  Eye Contact Fair  Facial Expression Animated;Anxious  Affect Anxious;Preoccupied  Speech Logical/coherent  Interaction Assertive;Intrusive  Motor Activity Fidgety;Pacing;Restless  Appearance/Hygiene Disheveled;In scrubs  Behavior Characteristics Appropriate to situation  Mood Pleasant  Thought Process  Coherency Concrete thinking  Content Blaming others;Obsessions  Delusions Controlled;Persecutory  Perception Hallucinations  Hallucination Auditory  Judgment Poor  Confusion None  Danger to Self  Current suicidal ideation? Denies  Danger to Others  Danger to Others None reported or observed

## 2020-06-10 NOTE — Progress Notes (Signed)
   06/09/20 2200  Psych Admission Type (Psych Patients Only)  Admission Status Involuntary  Psychosocial Assessment  Patient Complaints Anxiety  Eye Contact Fair  Facial Expression Animated;Anxious  Affect Anxious;Preoccupied  Speech Logical/coherent  Interaction Assertive;Intrusive  Motor Activity Fidgety;Pacing;Restless  Appearance/Hygiene Disheveled;In scrubs  Behavior Characteristics Appropriate to situation  Mood Pleasant  Thought Process  Coherency Concrete thinking  Content Blaming others;Obsessions  Delusions Controlled;Persecutory  Perception Hallucinations  Hallucination Auditory  Judgment Poor  Confusion None  Danger to Self  Current suicidal ideation? Denies  Danger to Others  Danger to Others None reported or observed

## 2020-06-10 NOTE — [facility] Counselor (Signed)
CSW spoke with this patients probation officer, Ms. Raquel James who stated that she had provided this patient with information for the [employer] program to assist him with housing and applying for disability, however she never received the documents back from him. Per Ms. Raquel James she will be sending a [employer] referral in for him by 06/11/20 in the morning to start this process for him. She stated that currently she does not have housing secured for him and stated that at this point he would be referred to a shelter for temporary housing.    Ruthann Cancer MSW, LCSW Clincal Social Worker  [facility]

## 2020-06-10 NOTE — Progress Notes (Signed)
[facility] MD Progress Note  06/10/2020 11:44 AM Jacob [name]  MRN:  [id number] Subjective: Patient is a 29 year old male with a past psychiatric history significant for schizophrenia versus schizoaffective disorder who was admitted on 06/06/2020 secondary to disorganization, auditory and visual hallucinations.  The patient stated that he had been discharged earlier this year, but did not take his medications at discharge.  He was admitted to the hospital for evaluation and stabilization.  Objective: Patient is seen and examined.  Patient is a 29 year old male with the above-stated past psychiatric history who is seen in follow-up.  He denies complaint today.  He stated he was not having any auditory or visual hallucinations.  He denied any suicidal or homicidal ideation.  His biggest issue today is continuing to have poorly controlled blood sugars.  He was seen by the hospitalist on 06/09/2020.  They recommended glipizide and Levemir.  They also recommended a sliding scale.  His blood sugar this morning was 207.  His blood pressure remains elevated this morning at 129/100.  Pulse is 90.  He is afebrile.  He slept 5.75 hours last night.  He stated his goal was to get his other long-acting shot.  His current medications include Cogentin, gabapentin, glipizide, hydroxyzine, the sliding scale insulin, Levemir and he is currently scheduled to get the paliperidone 156 mg injection on 06/12/2020.  He remains on 6 mg of paliperidone orally at bedtime.  Review of his admission laboratories showed elevated blood sugar but normal liver function enzymes.  His CBC was normal.  His hemoglobin A1c on 8/8 was 11.9.  His urinalysis on 8/5 showed glucose of 500 mg per DL, ketones at 20 mg per DL, drug screen was negative.  EKG showed a normal QTc interval.  Surprisingly he is not on any ACE inhibitor for renal protection and diabetes.  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 Diabetes mellitus without complication (HCC)    History reviewed. No pertinent surgical history. Family History: History reviewed. No pertinent family history. Family Psychiatric  History: See admission H&P Social History:  Social History   Substance and Sexual Activity  Alcohol Use No     Social History   Substance and Sexual Activity  Drug Use Not Currently    Social History   Socioeconomic History  . Marital status: Single    Spouse name: Not on file  . Number of children: Not on file  . Years of education: Not on file  . Highest education level: Not on file  Occupational History  . Not on file  Tobacco Use  . Smoking status: Never Smoker  . Smokeless tobacco: Never Used  Vaping Use  . Vaping Use: Never used  Substance and Sexual Activity  . Alcohol use: No  . Drug use: Not Currently  . Sexual activity: Not on file  Other Topics Concern  . Not on file  Social History Narrative  . Not on file   Social Determinants of Health   Financial Resource Strain:   . Difficulty of Paying Living Expenses:   Food Insecurity:   . Worried About Programme researcher, broadcasting/film/video in the Last  Year:   . Ran Out of Food in the Last Year:   Transportation Needs:   . Freight forwarder (Medical):   Marland Kitchen Lack of Transportation (Non-Medical):   Physical Activity:   . Days of Exercise per Week:   . Minutes of Exercise per Session:   Stress:   . Feeling of Stress :   Social Connections:   . Frequency of Communication with Friends and Family:   . Frequency of Social Gatherings with Friends and Family:   . Attends Religious Services:    . Active Member of Clubs or Organizations:   . Attends Banker Meetings:   Marland Kitchen Marital Status:    Additional Social History:                         Sleep: Fair  Appetite:  Good  Current Medications: Current Facility-Administered Medications  Medication Dose Route Frequency Provider Last Rate Last Admin  . acetaminophen (TYLENOL) tablet 650 mg  650 mg Oral Q6H PRN Patrcia Dolly, FNP      . alum & mag hydroxide-simeth (MAALOX/MYLANTA) 200-200-20 MG/5ML suspension 30 mL  30 mL Oral Q4H PRN Patrcia Dolly, FNP      . benztropine (COGENTIN) tablet 1 mg  1 mg Oral BID PRN Mariel Craft, MD      . gabapentin (NEURONTIN) capsule 300 mg  300 mg Oral TID Mariel Craft, MD   300 mg at 06/10/20 0804  . glipiZIDE (GLUCOTROL XL) 24 hr tablet 10 mg  10 mg Oral Q breakfast Patrcia Dolly, FNP   10 mg at 06/10/20 0804  . hydrOXYzine (ATARAX/VISTARIL) tablet 25 mg  25 mg Oral TID PRN Patrcia Dolly, FNP   25 mg at 06/09/20 2050  . insulin aspart (novoLOG) injection 0-9 Units  0-9 Units Subcutaneous TID WC Opyd, Lavone Neri, MD   5 Units at 06/10/20 [phone]  . insulin detemir (LEVEMIR) injection 25 Units  25 Units Subcutaneous BID Briscoe Deutscher, MD   25 Units at 06/10/20 0757  . magnesium hydroxide (MILK OF MAGNESIA) suspension 30 mL  30 mL Oral Daily PRN Patrcia Dolly, FNP      . [START ON 06/12/2020] paliperidone (INVEGA SUSTENNA) injection 156 mg  156 mg Intramuscular Once Mariel Craft, MD      . paliperidone (INVEGA) 24 hr tablet 6 mg  6 mg Oral QHS Mariel Craft, MD   6 mg at 06/09/20 2050  . traZODone (DESYREL) tablet 50 mg  50 mg Oral QHS PRN,MR X 1 Mariel Craft, MD   50 mg at 06/08/20 2052    Lab Results:  Results for orders placed or performed during the hospital encounter of 06/06/20 (from the past 48 hour(s))  Hemoglobin A1c     Status: Abnormal   Collection Time: 06/08/20  5:38 PM  Result Value Ref Range   Hgb A1c MFr Bld 11.9 (H) 4.8 - 5.6 %    Comment:  (NOTE) Pre diabetes:          5.7%-6.4%  Diabetes:              >6.4%  Glycemic control for   <7.0% adults with diabetes    Mean Plasma Glucose 294.83 mg/dL    Comment: Performed at [facility], [address] [place], [place] [address]  Glucose, capillary     Status: Abnormal   Collection Time: 06/08/20  7:48 PM  Result Value Ref Range   Glucose-Capillary 431 (H) 70 - 99 mg/dL    Comment: Glucose reference range applies only to samples taken after fasting for at least 8 hours.   Comment 1 Notify RN    Comment 2 Document in Chart   Glucose, capillary     Status: Abnormal   Collection Time: 06/09/20  6:06 AM  Result Value Ref Range   Glucose-Capillary 203 (H) 70 - 99 mg/dL    Comment: Glucose reference range applies only to samples taken after fasting for at least 8 hours.   Comment 1 Notify RN    Comment 2 Document in Chart   Glucose, capillary     Status: Abnormal   Collection Time: 06/09/20  7:26 PM  Result Value Ref Range   Glucose-Capillary 483 (H) 70 - 99 mg/dL    Comment: Glucose reference range applies only to samples taken after fasting for at least 8 hours.  Glucose, capillary     Status: Abnormal   Collection Time: 06/09/20  8:40 PM  Result Value Ref Range   Glucose-Capillary 509 (HH) 70 - 99 mg/dL    Comment: Glucose reference range applies only to samples taken after fasting for at least 8 hours.  Glucose, capillary     Status: Abnormal   Collection Time: 06/09/20  8:57 PM  Result Value Ref Range   Glucose-Capillary 430 (H) 70 - 99 mg/dL    Comment: Glucose reference range applies only to samples taken after fasting for at least 8 hours.  Glucose, capillary     Status: Abnormal   Collection Time: 06/09/20 11:39 PM  Result Value Ref Range   Glucose-Capillary 35 (LL) 70 - 99 mg/dL    Comment: Glucose reference range applies only to samples taken after fasting for at least 8 hours.  Glucose, capillary     Status: None   Collection Time: 06/09/20 11:54 PM   Result Value Ref Range   Glucose-Capillary 70 70 - 99 mg/dL    Comment: Glucose reference range applies only to samples taken after fasting for at least 8 hours.  Glucose, capillary     Status: Abnormal   Collection Time: 06/10/20 12:12 AM  Result Value Ref Range   Glucose-Capillary 135 (H) 70 - 99 mg/dL    Comment: Glucose reference range applies only to samples taken after fasting for at least 8 hours.  Glucose, capillary     Status: Abnormal   Collection Time: 06/10/20  6:09 AM  Result Value Ref Range   Glucose-Capillary 286 (H) 70 - 99 mg/dL    Comment: Glucose reference range applies only to samples taken after fasting for at least 8 hours.  Basic metabolic panel     Status: Abnormal   Collection Time: 06/10/20  6:27 AM  Result Value Ref Range   Sodium 139 135 - 145 mmol/L   Potassium 4.2 3.5 - 5.1 mmol/L   Chloride 100 98 - 111 mmol/L   CO2 30 22 - 32 mmol/L   Glucose, Bld 262 (H) 70 - 99 mg/dL    Comment: Glucose reference range applies only to samples taken after fasting for at least 8 hours.   BUN 16 6 - 20 mg/dL   Creatinine, Ser 1.610.80 0.61 - 1.24 mg/dL   Calcium 9.3 8.9 - 09.610.3 mg/dL   GFR calc non Af Amer >60 >60 mL/min   GFR calc Af Amer >60 >60 mL/min   Anion gap 9 5 - 15    Comment: Performed  at [facility], [address], [place], [place] [address]  Glucose, capillary     Status: Abnormal   Collection Time: 06/10/20  7:46 AM  Result Value Ref Range   Glucose-Capillary 207 (H) 70 - 99 mg/dL    Comment: Glucose reference range applies only to samples taken after fasting for at least 8 hours.    Blood Alcohol level:  Lab Results  Component Value Date   ETH <10 06/05/2020   ETH <10 11/16/2019    Metabolic Disorder Labs: Lab Results  Component Value Date   HGBA1C 11.9 (H) 06/08/2020   MPG 294.83 06/08/2020   MPG 383.8 03/04/2020   No results found for: PROLACTIN Lab Results  Component Value Date   CHOL 178 11/16/2019   TRIG 41  11/16/2019   HDL 79 11/16/2019   CHOLHDL 2.3 11/16/2019   VLDL 8 11/16/2019   LDLCALC 91 11/16/2019   LDLCALC 114 (H) 02/06/2018    Physical Findings: AIMS: Facial and Oral Movements Muscles of Facial Expression: None, normal Lips and Perioral Area: None, normal Jaw: None, normal Tongue: None, normal,Extremity Movements Upper (arms, wrists, hands, fingers): None, normal Lower (legs, knees, ankles, toes): None, normal, Trunk Movements Neck, shoulders, hips: None, normal, Overall Severity Severity of abnormal movements (highest score from questions above): None, normal Incapacitation due to abnormal movements: None, normal Patient's awareness of abnormal movements (rate only patient's report): No Awareness, Dental Status Current problems with teeth and/or dentures?: No Does patient usually wear dentures?: No  CIWA:    COWS:     Musculoskeletal: Strength & Muscle Tone: within normal limits Gait & Station: normal Patient leans: N/A  Psychiatric Specialty Exam: Physical Exam Vitals and nursing note reviewed.  Constitutional:      Appearance: Normal appearance.  HENT:     Head: Normocephalic and atraumatic.  Pulmonary:     Effort: Pulmonary effort is normal.  Neurological:     General: No focal deficit present.     Mental Status: He is alert and oriented to person, place, and time.     Review of Systems  Blood pressure (!) 129/100, pulse 90, temperature 98.7 F (37.1 C), temperature source Oral, resp. rate 18, height 5\' 10"  (1.778 m), weight 58.1 kg, SpO2 98 %.Body mass index is 18.37 kg/m.  General Appearance: Casual  Eye Contact:  Fair  Speech:  Normal Rate  Volume:  Normal  Mood:  Euthymic  Affect:  Congruent  Thought Process:  Coherent and Descriptions of Associations: Circumstantial  Orientation:  Full (Time, Place, and Person)  Thought Content:  Negative  Suicidal Thoughts:  No  Homicidal Thoughts:  No  Memory:  Immediate;   Fair Recent;   Fair Remote;    Fair  Judgement:  Impaired  Insight:  Lacking  Psychomotor Activity:  Normal  Concentration:  Concentration: Fair and Attention Span: Fair  Recall:  of Knowledge:  Fair  Language:  Good  Akathisia:  Negative  Handed:  Right  AIMS (if indicated):     Assets:  Desire for Improvement Resilience  ADL's:  Intact  Cognition:  WNL  Sleep:  Number of Hours: 5.75     Treatment Plan Summary: Daily contact with patient to assess and evaluate symptoms and progress in treatment, Medication management and Plan : Patient is seen and examined.  Patient is a 29 year old male with the above-stated past psychiatric history who is seen in follow-up.   Diagnosis: 1.  Schizoaffective disorder; bipolar type versus schizophrenia  2.  History of depression. 3.  Poorly controlled type 2 diabetes mellitus. 4.  Essential hypertension. 5.  Insomnia.  Pertinent findings on examination today: 1.  Patient denies any auditory or visual hallucinations. 2.  Patient denied any suicidal or homicidal ideation. 3.  Blood sugar control is elevated, but better than it has been. 4.  Blood pressure is elevated. 5.  Some problem with insomnia.  Plan: 1.  Continue Cogentin 1 mg p.o. twice daily as needed tremors. 2.  Continue gabapentin 300 mg p.o. 3 times daily for mood stability and chronic pain. 4.  Continue Glucotrol XL 10 mg p.o. daily for diabetes mellitus. 5.  Continue hydroxyzine 25 mg p.o. 3 times daily as needed anxiety. 6.  Continue sliding scale insulin with NovoLog insulin for diabetes mellitus type 2. 7.  Increase Levemir to 30 units subcutaneously twice daily for diabetes mellitus type 2. 8.  Have discussed with patient need for reduction of fruit juice for better glycemic control. 9.  Patient is scheduled to receive his second long-acting paliperidone injection 156 mg on 06/12/2020.  This is for psychosis. 10.  Continue paliperidone oral tablet 6 mg p.o. nightly for psychosis. 11.  Increase  trazodone 200 mg p.o. nightly as needed insomnia. 12.  Add lisinopril 5 mg p.o. daily for hypertension and renal protection. 13.  Disposition planning-in progress.  Antonieta Pert, MD 06/10/2020, 11:44 AM

## 2020-06-11 LAB — GLUCOSE, CAPILLARY
Glucose-Capillary: 108 mg/dL — ABNORMAL HIGH (ref 70–99)
Glucose-Capillary: 80 mg/dL (ref 70–99)
Glucose-Capillary: 356 mg/dL — ABNORMAL HIGH (ref 70–99)
Glucose-Capillary: 360 mg/dL — ABNORMAL HIGH (ref 70–99)
Glucose-Capillary: 414 mg/dL — ABNORMAL HIGH (ref 70–99)

## 2020-06-11 MED ORDER — INSULIN DETEMIR 100 UNIT/ML ~~LOC~~ SOLN
25.0000 [IU] | Freq: Every day | SUBCUTANEOUS | Status: DC
  Administered 2020-06-11 – 2020-06-12 (×2): 25 [IU] via SUBCUTANEOUS

## 2020-06-11 MED ORDER — INSULIN DETEMIR 100 UNIT/ML ~~LOC~~ SOLN: 30.0000 [IU] | Freq: Every day | SUBCUTANEOUS | Status: DC

## 2020-06-11 MED ORDER — LISINOPRIL 10 MG PO TABS
10.0000 mg | ORAL_TABLET | Freq: Every day | ORAL | Status: DC
  Administered 2020-06-12: 10 mg via ORAL
  Filled 2020-06-11 (×2): qty 1

## 2020-06-11 NOTE — Progress Notes (Signed)
   06/11/20 1214  COVID-19 Daily Checkoff  Have you had a fever (temp > 37.80C/100F)  in the past 24 hours?  No  If you have had runny nose, nasal congestion, sneezing in the past 24 hours, has it worsened? No  COVID-19 EXPOSURE  Have you traveled outside the state in the past 14 days? No  Have you been in contact with someone with a confirmed diagnosis of COVID-19 or PUI in the past 14 days without wearing appropriate PPE? No  Have you been living in the same home as a person with confirmed diagnosis of COVID-19 or a PUI (household contact)? No  Have you been diagnosed with COVID-19? No

## 2020-06-11 NOTE — [facility] Counselor (Signed)
CSW provided patient with shelter resources upon request.   Ruthann Cancer MSW, LCSW Clincal Social Worker  [facility]

## 2020-06-11 NOTE — Progress Notes (Signed)
Patient ID: Jacob [name], male   DOB: 12/11/1990, 29 y.o.   MRN: [id number]  CSW spoke with pt about disposition. Pt explained that he plans to have his mother pick him up and take him  To [facility]. Pt gave permission for CSW to call mom and grandma.   CSW called pt mom and was connected with grandma 3 way phone call. CSW explained that pt's referral info has been sent for [facility] program through his probation officer but that long-term housing is not yet secured and pt would be referred to a shelter unless he has another place. [name] confirms that she will be available to pick pt up from [facility]. Grandmother states that pt can stay with her if he were to follow her rules. She emphasizes that she does not want him to go to a shelter.

## 2020-06-11 NOTE — Progress Notes (Signed)
Pt CBG=[id number]. Provider notified. Told to watch and monitor. Pt has history of blood sugar dropping sharply overnight after elevated blood sugar in pm.

## 2020-06-11 NOTE — Progress Notes (Signed)
[facility]  06/11/2020 1:45 PM Jacob [name]  MRN:  [id number] Subjective:  Patient is a 29 year old male with a past psychiatric history significant for schizophrenia versus schizoaffective disorder who was admitted on 06/06/2020 secondary to disorganization, auditory and visual hallucinations.  The patient stated that he had been discharged earlier this year, but did not take his medications at discharge.  He was admitted to the hospital for evaluation and stabilization.  Objective: Patient is seen and examined.  Patient is a 29 year old male with the above-stated past psychiatric history who is seen in follow-up.  He continues to deny any complaints.  He denied any auditory or visual hallucinations.  He denied any suicidal or homicidal ideation.  He is scheduled to get his second paliperidone injection tomorrow.  His blood sugar this morning was 108, but at lunch was 80.  We will pull back on his insulin to bed.  His electrolytes from 8/10 were all essentially normal except his blood sugar when they drew it was 262.  Creatinine was normal.  He slept 6.5 hours last night.  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 Diabetes mellitus without complication (HCC)    History reviewed. No pertinent surgical history. Family History: History reviewed. No pertinent family history. Family Psychiatric  History: See admission H&P Social History:  Social History   Substance and Sexual Activity  Alcohol Use No     Social History   Substance and Sexual Activity  Drug Use Not  Currently    Social History   Socioeconomic History  . Marital status: Single    Spouse name: Not on file  . Number of children: Not on file  . Years of education: Not on file  . Highest education level: Not on file  Occupational History  . Not on file  Tobacco Use  . Smoking status: Never Smoker  . Smokeless tobacco: Never Used  Vaping Use  . Vaping Use: Never used  Substance and Sexual Activity  . Alcohol use: No  . Drug use: Not Currently  . Sexual activity: Not on file  Other Topics Concern  . Not on file  Social History Narrative  . Not on file   Social Determinants of Health   Financial Resource Strain:   . Difficulty of Paying Living Expenses:   Food Insecurity:   . Worried About Programme researcher, broadcasting/film/videounning Out of Food in the Last Year:   . Baristaan Out of Food in the Last Year:   Transportation Needs:   . Freight forwarderLack of Transportation (Medical):   Marland Kitchen. Lack of Transportation (Non-Medical):   Physical Activity:   . Days of Exercise per Week:   . Minutes of Exercise per Session:   Stress:   . Feeling of Stress :   Social Connections:   . Frequency of Communication with Friends and Family:   . Frequency of Social Gatherings with Friends and Family:   . Attends Religious Services:   . Active Member of Clubs or Organizations:   . Attends BankerClub or Organization Meetings:   Marland Kitchen. Marital Status:    Additional Social History:  Sleep: Good  Appetite:  Good  Current Medications: Current Facility-Administered Medications  Medication Dose Route Frequency Provider Last Rate Last Admin  . acetaminophen (TYLENOL) tablet 650 mg  650 mg Oral Q6H PRN Patrcia Dolly, FNP      . alum & mag hydroxide-simeth (MAALOX/MYLANTA) 200-200-20 MG/5ML suspension 30 mL  30 mL Oral Q4H PRN Patrcia Dolly, FNP      . benztropine (COGENTIN) tablet 1 mg  1 mg Oral BID PRN Mariel Craft, MD      . gabapentin (NEURONTIN) capsule 300 mg  300 mg Oral TID Mariel Craft, MD   300 mg at 06/11/20  1152  . glipiZIDE (GLUCOTROL XL) 24 hr tablet 10 mg  10 mg Oral Q breakfast Patrcia Dolly, FNP   10 mg at 06/11/20 0805  . hydrOXYzine (ATARAX/VISTARIL) tablet 25 mg  25 mg Oral TID PRN Patrcia Dolly, FNP   25 mg at 06/09/20 2050  . insulin aspart (novoLOG) injection 0-9 Units  0-9 Units Subcutaneous TID WC Opyd, Lavone Neri, MD   2 Units at 06/10/20 1657  . insulin detemir (LEVEMIR) injection 30 Units  30 Units Subcutaneous BID Antonieta Pert, MD   30 Units at 06/11/20 [phone]  . lisinopril (ZESTRIL) tablet 5 mg  5 mg Oral Daily Antonieta Pert, MD   5 mg at 06/11/20 0804  . magnesium hydroxide (MILK OF MAGNESIA) suspension 30 mL  30 mL Oral Daily PRN Patrcia Dolly, FNP      . [START ON 06/12/2020] paliperidone (INVEGA SUSTENNA) injection 156 mg  156 mg Intramuscular Once Mariel Craft, MD      . paliperidone (INVEGA) 24 hr tablet 6 mg  6 mg Oral QHS Mariel Craft, MD   6 mg at 06/10/20 2040  . traZODone (DESYREL) tablet 100 mg  100 mg Oral QHS PRN,MR X 1 Antonieta Pert, MD   100 mg at 06/10/20 2040    Lab Results:  Results for orders placed or performed during the hospital encounter of 06/06/20 (from the past 48 hour(s))  Glucose, capillary     Status: Abnormal   Collection Time: 06/09/20  7:26 PM  Result Value Ref Range   Glucose-Capillary 483 (H) 70 - 99 mg/dL    Comment: Glucose reference range applies only to samples taken after fasting for at least 8 hours.  Glucose, capillary     Status: Abnormal   Collection Time: 06/09/20  8:40 PM  Result Value Ref Range   Glucose-Capillary 509 (HH) 70 - 99 mg/dL    Comment: Glucose reference range applies only to samples taken after fasting for at least 8 hours.  Glucose, capillary     Status: Abnormal   Collection Time: 06/09/20  8:57 PM  Result Value Ref Range   Glucose-Capillary 430 (H) 70 - 99 mg/dL    Comment: Glucose reference range applies only to samples taken after fasting for at least 8 hours.  Glucose, capillary     Status:  Abnormal   Collection Time: 06/09/20 11:39 PM  Result Value Ref Range   Glucose-Capillary 35 (LL) 70 - 99 mg/dL    Comment: Glucose reference range applies only to samples taken after fasting for at least 8 hours.  Glucose, capillary     Status: None   Collection Time: 06/09/20 11:54 PM  Result Value Ref Range   Glucose-Capillary 70 70 - 99 mg/dL    Comment: Glucose reference range applies only to samples taken  after fasting for at least 8 hours.  Glucose, capillary     Status: Abnormal   Collection Time: 06/10/20 12:12 AM  Result Value Ref Range   Glucose-Capillary 135 (H) 70 - 99 mg/dL    Comment: Glucose reference range applies only to samples taken after fasting for at least 8 hours.  Glucose, capillary     Status: Abnormal   Collection Time: 06/10/20  6:09 AM  Result Value Ref Range   Glucose-Capillary 286 (H) 70 - 99 mg/dL    Comment: Glucose reference range applies only to samples taken after fasting for at least 8 hours.  Basic metabolic panel     Status: Abnormal   Collection Time: 06/10/20  6:27 AM  Result Value Ref Range   Sodium 139 135 - 145 mmol/L   Potassium 4.2 3.5 - 5.1 mmol/L   Chloride 100 98 - 111 mmol/L   CO2 30 22 - 32 mmol/L   Glucose, Bld 262 (H) 70 - 99 mg/dL    Comment: Glucose reference range applies only to samples taken after fasting for at least 8 hours.   BUN 16 6 - 20 mg/dL   Creatinine, Ser 8.29 0.61 - 1.24 mg/dL   Calcium 9.3 8.9 - 93.7 mg/dL   GFR calc non Af Amer >60 >60 mL/min   GFR calc Af Amer >60 >60 mL/min   Anion gap 9 5 - 15    Comment: Performed at [facility], [address], [place], [place] [address]  Glucose, capillary     Status: Abnormal   Collection Time: 06/10/20  7:46 AM  Result Value Ref Range   Glucose-Capillary 207 (H) 70 - 99 mg/dL    Comment: Glucose reference range applies only to samples taken after fasting for at least 8 hours.  Glucose, capillary     Status: Abnormal   Collection Time: 06/10/20  11:49 AM  Result Value Ref Range   Glucose-Capillary 235 (H) 70 - 99 mg/dL    Comment: Glucose reference range applies only to samples taken after fasting for at least 8 hours.  Glucose, capillary     Status: Abnormal   Collection Time: 06/10/20  4:48 PM  Result Value Ref Range   Glucose-Capillary 178 (H) 70 - 99 mg/dL    Comment: Glucose reference range applies only to samples taken after fasting for at least 8 hours.  Glucose, capillary     Status: Abnormal   Collection Time: 06/10/20  7:41 PM  Result Value Ref Range   Glucose-Capillary 222 (H) 70 - 99 mg/dL    Comment: Glucose reference range applies only to samples taken after fasting for at least 8 hours.  Glucose, capillary     Status: Abnormal   Collection Time: 06/11/20  5:40 AM  Result Value Ref Range   Glucose-Capillary 108 (H) 70 - 99 mg/dL    Comment: Glucose reference range applies only to samples taken after fasting for at least 8 hours.  Glucose, capillary     Status: None   Collection Time: 06/11/20 11:50 AM  Result Value Ref Range   Glucose-Capillary 80 70 - 99 mg/dL    Comment: Glucose reference range applies only to samples taken after fasting for at least 8 hours.    Blood Alcohol level:  Lab Results  Component Value Date   [facility] <10 06/05/2020   ETH <10 11/16/2019    Metabolic Disorder Labs: Lab Results  Component Value Date   HGBA1C 11.9 (H) 06/08/2020  MPG 294.83 06/08/2020   MPG 383.8 03/04/2020   No results found for: PROLACTIN Lab Results  Component Value Date   CHOL 178 11/16/2019   TRIG 41 11/16/2019   HDL 79 11/16/2019   CHOLHDL 2.3 11/16/2019   VLDL 8 11/16/2019   LDLCALC 91 11/16/2019   LDLCALC 114 (H) 02/06/2018    Physical Findings: AIMS: Facial and Oral Movements Muscles of Facial Expression: None, normal Lips and Perioral Area: None, normal Jaw: None, normal Tongue: None, normal,Extremity Movements Upper (arms, wrists, hands, fingers): None, normal Lower (legs, knees,  ankles, toes): None, normal, Trunk Movements Neck, shoulders, hips: None, normal, Overall Severity Severity of abnormal movements (highest score from questions above): None, normal Incapacitation due to abnormal movements: None, normal Patient's awareness of abnormal movements (rate only patient's report): No Awareness, Dental Status Current problems with teeth and/or dentures?: No Does patient usually wear dentures?: No  CIWA:    COWS:     Musculoskeletal: Strength & Muscle Tone: within normal limits Gait & Station: normal Patient leans: N/A  Psychiatric Specialty Exam: Physical Exam Vitals and nursing note reviewed.  Constitutional:      Appearance: Normal appearance.  HENT:     Head: Normocephalic and atraumatic.  Pulmonary:     Effort: Pulmonary effort is normal.  Neurological:     General: No focal deficit present.     Mental Status: He is alert and oriented to person, place, and time.     Review of Systems  Blood pressure 126/84, pulse 94, temperature 98.9 F (37.2 C), temperature source Oral, resp. rate 18, height 5\' 10"  (1.778 m), weight 58.1 kg, SpO2 98 %.Body mass index is 18.37 kg/m.  General Appearance: Disheveled  Eye Contact:  Fair  Speech:  Normal Rate  Volume:  Decreased  Mood:  Euthymic  Affect:  Congruent  Thought Process:  Coherent and Descriptions of Associations: Circumstantial  Orientation:  Full (Time, Place, and Person)  Thought Content:  Logical  Suicidal Thoughts:  No  Homicidal Thoughts:  No  Memory:  Immediate;   Fair Recent;   Fair Remote;   Fair  Judgement:  Intact  Insight:  Lacking  Psychomotor Activity:  Normal  Concentration:  Concentration: Fair and Attention Span: Fair  Recall:  of Knowledge:  Fair  Language:  Fair  Akathisia:  Negative  Handed:  Right  AIMS (if indicated):     Assets:  Desire for Improvement Resilience  ADL's:  Intact  Cognition:  WNL  Sleep:  Number of Hours: 6.5     Treatment Plan  Summary: Daily contact with patient to assess and evaluate symptoms and progress in treatment, Medication management and Plan : Patient is seen and examined.  Patient is a 29 year old male with the above-stated past psychiatric history who is seen in follow-up.   Diagnosis: 1.  Schizoaffective disorder; bipolar type versus schizophrenia 2.  History of depression. 3.  Poorly controlled type 2 diabetes mellitus. 4.  Essential hypertension. 5.  Insomnia.  Pertinent findings on examination today: 1.  Patient denies any auditory or visual hallucinations. 2.  Patient denied any suicidal or homicidal ideation. 3.  Unfortunately his blood sugars come down, but slightly low at lunch today. 4.  Blood pressure did improve a little bit this morning but now is a bit elevated. 5.  Insomnia is improved.  Plan: 1.  Continue Cogentin 1 mg p.o. twice daily as needed tremors. 2.  Continue gabapentin 300 mg p.o. 3 times daily for mood  stability and chronic pain. 4.  Continue Glucotrol XL 10 mg p.o. daily for diabetes mellitus. 5.  Continue hydroxyzine 25 mg p.o. 3 times daily as needed anxiety. 6.  Continue sliding scale insulin with NovoLog insulin for diabetes mellitus type 2. 7.    Change Levemir to 30 units subcutaneously nightly and 25 units subcu daily twice daily for diabetes mellitus type 2. 8.  Have discussed with patient need for reduction of fruit juice for better glycemic control. 9.  Patient is scheduled to receive his second long-acting paliperidone injection 156 mg on 06/12/2020.  This is for psychosis. 10.  Continue paliperidone oral tablet 6 mg p.o. nightly for psychosis. 11.  Continue trazodone 100 mg p.o. nightly as needed insomnia. 12.  Increase lisinopril to 10 mg p.o. daily for hypertension and renal protection. 13.  Disposition planning-in progress.  Antonieta Pert, MD 06/11/2020, 1:45 PM

## 2020-06-12 LAB — GLUCOSE, CAPILLARY: Glucose-Capillary: 295 mg/dL — ABNORMAL HIGH (ref 70–99)

## 2020-06-12 MED ORDER — INSULIN DETEMIR 100 UNIT/ML ~~LOC~~ SOLN: 30.0000 [IU] | Freq: Every day | SUBCUTANEOUS | 0 refills | Status: DC

## 2020-06-12 MED ORDER — TRAZODONE HCL 100 MG PO TABS: 100.0000 mg | ORAL_TABLET | Freq: Every evening | ORAL | 0 refills | Status: DC | PRN

## 2020-06-12 MED ORDER — INSULIN DETEMIR 100 UNIT/ML ~~LOC~~ SOLN: 25.0000 [IU] | Freq: Every day | SUBCUTANEOUS | 0 refills | Status: DC

## 2020-06-12 MED ORDER — LISINOPRIL 10 MG PO TABS: 10.0000 mg | ORAL_TABLET | Freq: Every day | ORAL | 0 refills | Status: DC

## 2020-06-12 MED ORDER — GABAPENTIN 300 MG PO CAPS: 300.0000 mg | ORAL_CAPSULE | Freq: Three times a day (TID) | ORAL | 0 refills | Status: DC

## 2020-06-12 MED ORDER — HYDROXYZINE HCL 25 MG PO TABS: 25.0000 mg | ORAL_TABLET | Freq: Three times a day (TID) | ORAL | 0 refills | Status: DC | PRN

## 2020-06-12 MED ORDER — GLIPIZIDE ER 10 MG PO TB24: 10.0000 mg | ORAL_TABLET | Freq: Every day | ORAL | 0 refills | Status: DC

## 2020-06-12 MED ORDER — BENZTROPINE MESYLATE 1 MG PO TABS: 1.0000 mg | ORAL_TABLET | Freq: Two times a day (BID) | ORAL | 0 refills | Status: DC | PRN

## 2020-06-12 MED ORDER — PALIPERIDONE PALMITATE ER 156 MG/ML IM SUSY: 156.0000 mg | PREFILLED_SYRINGE | Freq: Once | INTRAMUSCULAR | 0 refills | Status: DC

## 2020-06-12 MED ORDER — PALIPERIDONE PALMITATE ER 156 MG/ML IM SUSY: 156.0000 mg | PREFILLED_SYRINGE | Freq: Once | INTRAMUSCULAR | Status: DC

## 2020-06-12 MED ORDER — PALIPERIDONE ER 6 MG PO TB24: 6.0000 mg | ORAL_TABLET | Freq: Every day | ORAL | 0 refills | Status: DC

## 2020-06-12 NOTE — Plan of Care (Signed)
Discharge note  Patient verbalizes readiness for discharge. Follow up plan explained, AVS, Transition record and SRA given. Prescriptions and teaching provided. Belongings returned and signed for. Suicide safety plan completed and signed. Patient verbalizes understanding. Patient denies SI/HI and assures this Clinical research associate they will seek assistance should that change. Patient discharged to lobby where mother was waiting.   Problem: Education: Goal: Knowledge of [place] [facility] Education information/materials will improve Outcome: Adequate for Discharge   Problem: Coping: Goal: Ability to identify and develop effective coping behavior will improve Outcome: Adequate for Discharge   Problem: Self-Concept: Goal: Ability to identify factors that promote anxiety will improve Outcome: Adequate for Discharge Goal: Level of anxiety will decrease Outcome: Adequate for Discharge Goal: Ability to modify response to factors that promote anxiety will improve Outcome: Adequate for Discharge   Problem: Coping: Goal: Coping ability will improve Outcome: Adequate for Discharge Goal: Will verbalize feelings Outcome: Adequate for Discharge   Problem: Health Behavior/Discharge Planning: Goal: Compliance with prescribed medication regimen will improve Outcome: Adequate for Discharge   Problem: Nutritional: Goal: Ability to achieve adequate nutritional intake will improve Outcome: Adequate for Discharge   Problem: Role Relationship: Goal: Ability to communicate needs accurately will improve Outcome: Adequate for Discharge Goal: Ability to interact with others will improve Outcome: Adequate for Discharge   Problem: Safety: Goal: Ability to redirect hostility and anger into socially appropriate behaviors will improve Outcome: Adequate for Discharge Goal: Ability to remain free from injury will improve Outcome: Adequate for Discharge

## 2020-06-12 NOTE — [facility] Suicide Risk Assessment (Signed)
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   Blood pressure (!) 139/91, pulse 96, temperature 98.2 F (36.8 C), temperature source Oral, resp. rate 18, height 5\' 10"  (1.778 m), weight 58.1 kg, SpO2 98 %.Body mass index is 18.37 kg/m.  General Appearance: Casual  Eye Contact::  Fair  Speech:  Normal Rate409  Volume:  Normal  Mood:  Euthymic  Affect:  Congruent  Thought Process:  Coherent and Descriptions of Associations: Intact  Orientation:  Full (Time, Place, and Person)  Thought Content:  Negative  Suicidal Thoughts:  No  Homicidal Thoughts:  No  Memory:  Immediate;   Fair Recent;   Fair Remote;   Fair  Judgement:  Intact  Insight:  Fair  Psychomotor Activity:  Normal  Concentration:  Fair  Recall:  [phone] of Knowledge:Fair  Language: Fair  Akathisia:  Negative  Handed:  Right  AIMS (if indicated):     Assets:  Desire for Improvement Resilience  Sleep:  Number of Hours: 9  Cognition: WNL  ADL's:  Intact   Mental Status Per Nursing Assessment::   On Admission:  NA  Demographic Factors:  Male, Low socioeconomic status and Unemployed  Loss Factors: Financial problems/change in socioeconomic status  Historical Factors: NA  Risk Reduction Factors:   Living with another person, especially a relative and Positive social support  Continued Clinical Symptoms:  Schizophrenia:   Less than 47 years  old Paranoid or undifferentiated type  Cognitive Features That Contribute To Risk:  None    Suicide Risk:  Minimal: No identifiable suicidal ideation.  Patients presenting with no risk factors but with morbid ruminations; may be classified as minimal risk based on the severity of the depressive symptoms    Plan Of Care/Follow-up recommendations:  Activity:  ad lib  41, MD 06/12/2020, 7:35 AM

## 2020-06-12 NOTE — Progress Notes (Signed)
The patient rated his day as a 3 out of a possible 10 since he was not discharged. Patient states that he will be discharged on Thursday. The patient indicated that he has no place to live and lacks transportation as well. His goal for tomorrow is to "concentrate on the thinking process".

## 2020-06-12 NOTE — Progress Notes (Signed)
  [facility] Adult Case Management Discharge Plan :  Will you be returning to the same living situation after discharge:  No. To be staying with grandmother at discharge. At discharge, do you have transportation home?: Yes,  mother to pick patient up. Do you have the ability to pay for your medications: Yes,  has insurance.  Release of information consent forms completed and in the chart;  Patient's signature needed at discharge.  Patient to Follow up at:  Follow-up Information    [facility] [employer]. Go to.   Specialty: Professional Counselor Why: Please follow up with clinic for outpatient services during walk-in hours; Monday-Friday 8:30a.-12:00p and 1:00p-2:30p. Be sure to bring the following; photo ID, insurance card, SSN, current medications and discharge paperwork from this hospitalization. Contact information: [facility] [address] [place] [address] [phone]               Next level of care provider has access to [facility] Link:no  Safety Planning and Suicide Prevention discussed: Yes,  with mother.     Has patient been referred to the Quitline?: Patient refused referral  Patient has been referred for addiction treatment: Pt. refused referral  Otelia Santee, LCSW 06/12/2020, 10:57 AM
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
## 2020-06-12 NOTE — Progress Notes (Signed)
   06/11/20 1950  Psych Admission Type (Psych Patients Only)  Admission Status Involuntary  Psychosocial Assessment  Patient Complaints None  Eye Contact Fair  Facial Expression Animated;Anxious  Affect Preoccupied  Speech Logical/coherent  Interaction Assertive  Motor Activity Fidgety;Pacing;Restless  Appearance/Hygiene Disheveled;In scrubs  Behavior Characteristics Cooperative  Mood Pleasant  Thought Process  Coherency Concrete thinking  Content Preoccupation  Delusions Controlled  Perception WDL  Hallucination None reported or observed  Judgment Poor  Confusion None  Danger to Self  Current suicidal ideation? Denies  Danger to Others  Danger to Others None reported or observed

## 2020-07-02 ENCOUNTER — Telehealth (HOSPITAL_COMMUNITY): Payer: Medicaid Other | Admitting: Psychiatry

## 2020-07-02 ENCOUNTER — Encounter (HOSPITAL_COMMUNITY): Payer: Self-pay | Admitting: Psychiatry

## 2020-07-02 ENCOUNTER — Other Ambulatory Visit: Payer: Self-pay

## 2020-07-10 ENCOUNTER — Ambulatory Visit: Payer: Self-pay | Admitting: Nurse Practitioner

## 2020-07-16 ENCOUNTER — Other Ambulatory Visit: Payer: Self-pay

## 2020-07-16 ENCOUNTER — Encounter (HOSPITAL_COMMUNITY): Payer: Self-pay | Admitting: Psychiatry

## 2020-07-16 ENCOUNTER — Ambulatory Visit (INDEPENDENT_AMBULATORY_CARE_PROVIDER_SITE_OTHER): Payer: Medicaid Other | Admitting: Clinical

## 2020-07-16 ENCOUNTER — Ambulatory Visit (HOSPITAL_COMMUNITY): Payer: Medicaid Other

## 2020-07-16 ENCOUNTER — Ambulatory Visit (INDEPENDENT_AMBULATORY_CARE_PROVIDER_SITE_OTHER): Payer: Medicaid Other | Admitting: Psychiatry

## 2020-07-16 VITALS — BP 141/79 | HR 87 | Temp 98.8°F | Ht 70.0 in | Wt 132.0 lb

## 2020-07-16 DIAGNOSIS — F251 Schizoaffective disorder, depressive type: Secondary | ICD-10-CM

## 2020-07-16 DIAGNOSIS — F332 Major depressive disorder, recurrent severe without psychotic features: Secondary | ICD-10-CM

## 2020-07-16 DIAGNOSIS — F411 Generalized anxiety disorder: Secondary | ICD-10-CM

## 2020-07-16 MED ORDER — BENZTROPINE MESYLATE 1 MG PO TABS: 1.0000 mg | ORAL_TABLET | Freq: Two times a day (BID) | ORAL | 2 refills | Status: DC | PRN

## 2020-07-16 MED ORDER — TRAZODONE HCL 100 MG PO TABS: 100.0000 mg | ORAL_TABLET | Freq: Every evening | ORAL | 2 refills | Status: DC | PRN

## 2020-07-16 MED ORDER — GABAPENTIN 300 MG PO CAPS: 300.0000 mg | ORAL_CAPSULE | Freq: Three times a day (TID) | ORAL | 2 refills | Status: DC

## 2020-07-16 MED ORDER — PALIPERIDONE PALMITATE ER 156 MG/ML IM SUSY
156.0000 mg | PREFILLED_SYRINGE | Freq: Once | INTRAMUSCULAR | Status: AC
  Administered 2020-07-16: 156 mg via INTRAMUSCULAR

## 2020-07-16 MED ORDER — PALIPERIDONE PALMITATE ER 156 MG/ML IM SUSY: 156.0000 mg | PREFILLED_SYRINGE | Freq: Once | INTRAMUSCULAR | 11 refills | Status: DC

## 2020-07-16 MED ORDER — HYDROXYZINE HCL 25 MG PO TABS: 25.0000 mg | ORAL_TABLET | Freq: Three times a day (TID) | ORAL | 2 refills | Status: DC | PRN
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
## 2020-07-16 NOTE — Progress Notes (Signed)
Patient ID: Jacob [name], male   DOB: 10/03/1991, 29 y.o.   MRN: [id number] First time seeing and administering Invega Sustenna 156 mg IM in L deltoid. He is pleasant, animated, appropriate, and denies any psychotic sx. He states he is fairly recently out of the [facility] and was put on a shot for psychosis related to depression. States he had been on pills for awhile but likes being on the shot better. To return for next injection in one month.

## 2020-07-28 ENCOUNTER — Encounter (HOSPITAL_COMMUNITY): Payer: Self-pay

## 2020-07-28 ENCOUNTER — Other Ambulatory Visit: Payer: Self-pay

## 2020-07-28 ENCOUNTER — Emergency Department (HOSPITAL_COMMUNITY)
Admission: EM | Admit: 2020-07-28 | Discharge: 2020-07-28 | Disposition: A | Discharge status: Home / Self Care | Payer: Medicaid Other | Attending: Emergency Medicine | Admitting: Emergency Medicine

## 2020-07-28 DIAGNOSIS — Z5321 Procedure and treatment not carried out due to patient leaving prior to being seen by health care provider: Secondary | ICD-10-CM | POA: Diagnosis not present

## 2020-07-28 DIAGNOSIS — R1110 Vomiting, unspecified: Secondary | ICD-10-CM | POA: Insufficient documentation

## 2020-07-28 DIAGNOSIS — R739 Hyperglycemia, unspecified: Secondary | ICD-10-CM | POA: Insufficient documentation

## 2020-07-28 LAB — CBG MONITORING, ED: Glucose-Capillary: >600 mg/dL (ref 70–99)

## 2020-07-28 LAB — CBC
HCT: 49.2 % (ref 39.0–52.0)
Hemoglobin: 16.8 g/dL (ref 13.0–17.0)
MCH: 29 pg (ref 26.0–34.0)
MCHC: 34.1 g/dL (ref 30.0–36.0)
MCV: 84.8 fL (ref 80.0–100.0)
Platelets: 305 10*3/uL (ref 150–400)
RBC: 5.8 MIL/uL (ref 4.22–5.81)
RDW: 12 % (ref 11.5–15.5)
WBC: 4.6 10*3/uL (ref 4.0–10.5)
nRBC: 0 % (ref 0.0–0.2)

## 2020-07-28 LAB — BASIC METABOLIC PANEL
Anion gap: 16 — ABNORMAL HIGH (ref 5–15)
BUN: 20 mg/dL (ref 6–20)
CO2: 20 mmol/L — ABNORMAL LOW (ref 22–32)
Calcium: 9.8 mg/dL (ref 8.9–10.3)
Chloride: 86 mmol/L — ABNORMAL LOW (ref 98–111)
Creatinine, Ser: 1.2 mg/dL (ref 0.61–1.24)
GFR calc Af Amer: >60 mL/min (ref >60)
GFR calc non Af Amer: >60 mL/min (ref >60)
Glucose, Bld: 803 mg/dL (ref 70–99)
Potassium: 4.9 mmol/L (ref 3.5–5.1)
Sodium: 122 mmol/L — ABNORMAL LOW (ref 135–145)

## 2020-07-28 LAB — BETA-HYDROXYBUTYRIC ACID: Beta-Hydroxybutyric Acid: 5.03 mmol/L — ABNORMAL HIGH (ref 0.05–0.27)

## 2020-07-28 MED ORDER — DEXTROSE IN LACTATED RINGERS 5 % IV SOLN: INTRAVENOUS | Status: DC

## 2020-07-28 MED ORDER — INSULIN REGULAR(HUMAN) IN NACL 100-0.9 UT/100ML-% IV SOLN: INTRAVENOUS | Status: DC

## 2020-07-28 MED ORDER — DEXTROSE 50 % IV SOLN: 0.0000 mL | INTRAVENOUS | Status: DC | PRN

## 2020-07-28 MED ORDER — LACTATED RINGERS IV BOLUS: 2000.0000 mL | Freq: Once | INTRAVENOUS | Status: DC

## 2020-07-28 MED ORDER — LACTATED RINGERS IV SOLN: INTRAVENOUS | Status: DC

## 2020-07-28 NOTE — ED Notes (Signed)
Pt Mother checked in also and Mother did not want to wait to be seen. Pts Mother said they were leaving and going to urgent care.

## 2020-07-28 NOTE — ED Notes (Signed)
Pt eloped from waiting area. Called 3X.  

## 2020-07-28 NOTE — ED Notes (Signed)
Date and time results received: 07/28/20 9:49 PM  (use smartphrase ".now" to insert current time)  Test: Glucose Critical Value: 803  Name of Provider Notified: Dr.Sheldon  Orders Received? Or Actions Taken?:

## 2020-07-28 NOTE — ED Triage Notes (Signed)
Pt presents to ER for c/o hyperglycemia after not administering insulin for 1 week. Denies trying to harm self. States vomiting 3 times today.

## 2020-07-30 LAB — HEMOGLOBIN A1C
Hgb A1c MFr Bld: >15.5 % — ABNORMAL HIGH (ref 4.8–5.6)
Mean Plasma Glucose: >398 mg/dL

## 2020-08-15 ENCOUNTER — Other Ambulatory Visit (HOSPITAL_COMMUNITY): Payer: Self-pay | Admitting: *Deleted

## 2020-08-15 ENCOUNTER — Other Ambulatory Visit: Payer: Self-pay

## 2020-08-15 ENCOUNTER — Ambulatory Visit (INDEPENDENT_AMBULATORY_CARE_PROVIDER_SITE_OTHER): Payer: Medicaid Other | Admitting: *Deleted

## 2020-08-15 ENCOUNTER — Telehealth (HOSPITAL_COMMUNITY): Payer: Self-pay | Admitting: *Deleted

## 2020-08-15 ENCOUNTER — Ambulatory Visit (HOSPITAL_COMMUNITY): Payer: Medicaid Other

## 2020-08-15 ENCOUNTER — Encounter (HOSPITAL_COMMUNITY): Payer: Self-pay

## 2020-08-15 VITALS — BP 123/79 | HR 91 | Ht 70.0 in | Wt 138.0 lb

## 2020-08-15 DIAGNOSIS — F251 Schizoaffective disorder, depressive type: Secondary | ICD-10-CM

## 2020-08-15 MED ORDER — PALIPERIDONE PALMITATE ER 156 MG/ML IM SUSY
156.0000 mg | PREFILLED_SYRINGE | INTRAMUSCULAR | Status: AC
  Administered 2020-08-15 – 2021-05-07 (×10): 156 mg via INTRAMUSCULAR

## 2020-08-15 MED ORDER — PALIPERIDONE PALMITATE ER 156 MG/ML IM SUSY: 156.0000 mg | PREFILLED_SYRINGE | Freq: Once | INTRAMUSCULAR | 11 refills | Status: DC

## 2020-08-15 NOTE — Telephone Encounter (Signed)
Thank you for correcting the RX location.

## 2020-08-15 NOTE — Progress Notes (Signed)
In as scheduled for his monthly Invega injection. He received 156 mg in his L deltoid today. Next visit he must bring his own injection as he has Medicaid and today is his second shot that a sample was used. He is pleasant and appropriate and denies any sx of his illness. 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 Rescheduled in one month.

## 2020-08-15 NOTE — Telephone Encounter (Signed)
Patients grandmother called stating she went to pick up his injection for his next shot and it wasn't there. Checked chart again and she went to the [place] at [facility] and his preferred pharmacy in chart is on [address]. She states she cant get all the way over there to [facility] and we will have to move it to [facility]. Rx moved at her request.

## 2020-08-18 ENCOUNTER — Other Ambulatory Visit (HOSPITAL_COMMUNITY): Payer: Self-pay | Admitting: Psychiatry

## 2020-08-18 ENCOUNTER — Telehealth (HOSPITAL_COMMUNITY): Payer: Self-pay | Admitting: *Deleted

## 2020-08-18 DIAGNOSIS — F411 Generalized anxiety disorder: Secondary | ICD-10-CM

## 2020-08-18 DIAGNOSIS — F251 Schizoaffective disorder, depressive type: Secondary | ICD-10-CM

## 2020-08-18 MED ORDER — PALIPERIDONE PALMITATE ER 156 MG/ML IM SUSY: 156.0000 mg | PREFILLED_SYRINGE | Freq: Once | INTRAMUSCULAR | 11 refills | Status: DC

## 2020-08-18 MED ORDER — BENZTROPINE MESYLATE 1 MG PO TABS: 1.0000 mg | ORAL_TABLET | Freq: Two times a day (BID) | ORAL | 2 refills | Status: DC | PRN

## 2020-08-18 MED ORDER — HYDROXYZINE HCL 25 MG PO TABS: 25.0000 mg | ORAL_TABLET | Freq: Three times a day (TID) | ORAL | 2 refills | Status: DC | PRN

## 2020-08-18 MED ORDER — TRAZODONE HCL 100 MG PO TABS: 100.0000 mg | ORAL_TABLET | Freq: Every evening | ORAL | 2 refills | Status: DC | PRN

## 2020-08-18 MED ORDER — GABAPENTIN 300 MG PO CAPS: 300.0000 mg | ORAL_CAPSULE | Freq: Three times a day (TID) | ORAL | 2 refills | Status: DC

## 2020-08-18 NOTE — Telephone Encounter (Signed)
Medications sent to preferred pharmacy.

## 2020-08-18 NOTE — Telephone Encounter (Signed)
Call from patients grandmother that his medicines were not moved to the [place] at [facility]. Will ask provider to please change his pharmacy.

## 2020-09-12 ENCOUNTER — Other Ambulatory Visit: Payer: Self-pay

## 2020-09-12 ENCOUNTER — Ambulatory Visit (INDEPENDENT_AMBULATORY_CARE_PROVIDER_SITE_OTHER): Payer: Medicaid Other | Admitting: Clinical

## 2020-09-12 DIAGNOSIS — F251 Schizoaffective disorder, depressive type: Secondary | ICD-10-CM

## 2020-09-12 NOTE — Progress Notes (Signed)
   THERAPIST PROGRESS NOTE  Session Time: 30 minutes  Participation Level: Active  Behavioral Response: CasualAlertEuthymic  Type of Therapy: Individual Therapy  Treatment Goals addressed: Diagnosis: depression  Interventions: CBT  Summary:  Jacob [name] is a 29 y.o. male who presents for the scheduled session oriented times five, appropriately dressed, and friendly. Client denied hallucinations and delusions. Client reported on today he is doing good. Client reported prior to the session he just got off work at [employer]. 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 Client reported he has also been doing well on his psychotropic medications without any side effects. Client reported things at home have been going well and better than reported before with his grandmother but he is wanting to find his own place to live. Client reported his probation officer is going to help him look for a place. Client engaged with the therapist to discuss the depression cycle. Client was actively engaged with the therapist in the prompted discussion about the cycle of depressive thoughts, physical symptoms and actions that occur.    Suicidal/Homicidal: Nowithout intent/plan  Therapist Response:  Therapist began the session by checking in and asking the client how he has been doing since last seen. Therapist actively listened to the clients thoughts and feelings. Therapist asked the client open ended questions about how he feels his mood has changed positively since he began his medications. Therapist used CBT by use of an interactive psycho educational worksheet that prompts the client to identify thinking patterns that are associated depression and identifying existing interests to help him become active physcially and/ or mentally engaged in activities to help him feel more motivated and hopeful.   Plan: Return again in 3 weeks for individual  therapy.  Diagnosis: Schizoaffective disorder, depressive type   Neena Rhymes Ashey Tramontana, LCSW 09/12/2020

## 2020-09-15 ENCOUNTER — Other Ambulatory Visit: Payer: Self-pay

## 2020-09-15 ENCOUNTER — Encounter (HOSPITAL_COMMUNITY): Payer: Self-pay

## 2020-09-15 ENCOUNTER — Ambulatory Visit (INDEPENDENT_AMBULATORY_CARE_PROVIDER_SITE_OTHER): Payer: Medicaid Other | Admitting: *Deleted

## 2020-09-15 VITALS — BP 118/85 | HR 77 | Ht 70.0 in | Wt 142.0 lb

## 2020-09-15 DIAGNOSIS — F251 Schizoaffective disorder, depressive type: Secondary | ICD-10-CM

## 2020-09-15 NOTE — Progress Notes (Signed)
Seen initially per provider. Grandma accompanied him to appt. When asked what was new, he reported he is eating at night and shouldn't and grandma asked to speak and explain and states she believes his illness is making him eat all the time "like he cant get full". He has gained several pounds in one month.He is an insulin dependent diabetic. When he was asked why he thinks he eats all the time, he states he has a tingling in his tongue and he thinks if he eats it will then go away, and he says it does. He does see his PCP regularly re his diabetes. See providers note for details re her assessment. Injection of [place] given as ordered, 156 mg in R deltoid without difficulty. He denied psychotic sx. Pleasant and appropriate with Clinical research associate. To return for his next injection in one month.

## 2020-09-24 ENCOUNTER — Ambulatory Visit (HOSPITAL_COMMUNITY): Payer: Self-pay | Admitting: Clinical

## 2020-10-08 LAB — HM DIABETES EYE EXAM

## 2020-10-15 ENCOUNTER — Other Ambulatory Visit: Payer: Self-pay

## 2020-10-15 ENCOUNTER — Ambulatory Visit (INDEPENDENT_AMBULATORY_CARE_PROVIDER_SITE_OTHER): Payer: Medicaid Other | Admitting: Psychiatry

## 2020-10-15 ENCOUNTER — Ambulatory Visit (HOSPITAL_COMMUNITY): Payer: Medicaid Other | Admitting: *Deleted

## 2020-10-15 ENCOUNTER — Encounter (HOSPITAL_COMMUNITY): Payer: Self-pay | Admitting: Psychiatry

## 2020-10-15 ENCOUNTER — Encounter (HOSPITAL_COMMUNITY): Payer: Self-pay

## 2020-10-15 VITALS — BP 103/64 | HR 97 | Ht 70.0 in | Wt 140.0 lb

## 2020-10-15 DIAGNOSIS — F251 Schizoaffective disorder, depressive type: Secondary | ICD-10-CM

## 2020-10-15 DIAGNOSIS — F411 Generalized anxiety disorder: Secondary | ICD-10-CM | POA: Diagnosis not present

## 2020-10-15 MED ORDER — PALIPERIDONE PALMITATE ER 156 MG/ML IM SUSY: 156.0000 mg | PREFILLED_SYRINGE | Freq: Once | INTRAMUSCULAR | 11 refills | Status: DC

## 2020-10-15 MED ORDER — HYDROXYZINE HCL 10 MG PO TABS: 10.0000 mg | ORAL_TABLET | Freq: Three times a day (TID) | ORAL | 2 refills | Status: DC | PRN

## 2020-10-15 MED ORDER — GABAPENTIN 300 MG PO CAPS: 300.0000 mg | ORAL_CAPSULE | Freq: Three times a day (TID) | ORAL | 2 refills | Status: DC

## 2020-10-15 MED ORDER — BENZTROPINE MESYLATE 1 MG PO TABS: 1.0000 mg | ORAL_TABLET | Freq: Two times a day (BID) | ORAL | 2 refills | Status: DC | PRN

## 2020-10-15 MED ORDER — TRAZODONE HCL 100 MG PO TABS: 100.0000 mg | ORAL_TABLET | Freq: Every evening | ORAL | 2 refills | Status: DC | PRN

## 2020-10-15 NOTE — Progress Notes (Signed)
BH MD/PA/NP OP Progress Note  10/15/2020 4:51 PM Jacob [name]  MRN:  [id number]  Chief Complaint: "Things are pretty good but at time I get anxious"       Per grandmother "Hes doing better since he started taking his shot"  HPI: 29 year old male seen today for follow up psychiatric evaluation.  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 He is currently being managed on Cogentin 1 mg twice daily, gabapentin 300 mg 3 times daily, hydroxyzine 25 mg 3 times daily, and Invega 156 mg every 28 days. Patient notes that he is compliant with Invega but would now like to restart his other medications.   Today he is well-groomed, pleasant, cooperative, engages in conversation, and maintains eye contact.  Patient informed writer that since taking Invega regularly his anxiety, depression, and psychotic symptoms have improved.  Provider conducted a GAD-7 today and patient scored a 12.  Provider also conducted a PHQ-9 and patient scored a 14.  He notes that he often worries about his future and finances.  He notes that he fears how life will be after his grandmother, father, and brother diagnosed.  Today he denies SI/HI/VAH or paranoia.    Patient was seen with his grandmother who notes that she feels that he eats to much  and is concerned about his diabetes. He informed Clinical research associate that he is constantly hungry and eats what he wants because he dislikes the diabetic diet. He also informed provider that he continue to smoke marijuana every other day. Provider informed patient that increased marijuana use can increase his appetite, cause irritability, and cause AVH. Patient notes that he uses marijuana helps him with his anxiety. Provider suggested patient restart hydroxyzine and reduce his marijuana consumption. He was agreeable to this recommendation. He will also restart all other medications. No medication changes made today. No other concerns at this time.     Visit Diagnosis:    ICD-10-CM    1. Generalized anxiety disorder  F41.1 hydrOXYzine (ATARAX/VISTARIL) 10 MG tablet  2. Schizoaffective disorder, depressive type (HCC)  F25.1 gabapentin (NEURONTIN) 300 MG capsule    paliperidone (INVEGA SUSTENNA) 156 MG/ML SUSY injection    traZODone (DESYREL) 100 MG tablet    benztropine (COGENTIN) 1 MG tablet    Past Psychiatric History: MDD with psychotic features and SI  Past Medical History:  Past Medical History:  Diagnosis Date  . Diabetes mellitus without complication (HCC)    History reviewed. No pertinent surgical history.  Family Psychiatric History: Unknown  Family History: History reviewed. No pertinent family history.  Social History:  Social History   Socioeconomic History  . Marital status: Single    Spouse name: Not on file  . Number of children: Not on file  . Years of education: Not on file  . Highest education level: Not on file  Occupational History  . Not on file  Tobacco Use  . Smoking status: Never Smoker  . Smokeless tobacco: Never Used  Vaping Use  . Vaping Use: Never used  Substance and Sexual Activity  . Alcohol use: No  . Drug use: Not Currently  . Sexual activity: Not on file  Other Topics Concern  . Not on file  Social History Narrative  . Not on file   Social Determinants of Health   Financial Resource Strain: Not on file  Food Insecurity: Not on file  Transportation Needs: Not on file  Physical Activity: Not on file  Stress: Not on file  Social Connections: Not on file    Allergies: No Known Allergies  Metabolic Disorder Labs: Lab Results  Component Value Date   HGBA1C >15.5 (H) 07/28/2020   MPG >398 07/28/2020   MPG 294.83 06/08/2020   No results found for: PROLACTIN Lab Results  Component Value Date   CHOL 178 11/16/2019   TRIG 41 11/16/2019   HDL 79 11/16/2019   CHOLHDL 2.3 11/16/2019   VLDL 8 11/16/2019   LDLCALC 91 11/16/2019   LDLCALC 114 (H) 02/06/2018   Lab Results  Component Value Date   TSH 1.568  11/16/2019   TSH 1.277 02/06/2018    Therapeutic Level Labs: No results found for: LITHIUM No results found for: VALPROATE No components found for:  CBMZ  Current Medications: Current Outpatient Medications  Medication Sig Dispense Refill  . benztropine (COGENTIN) 1 MG tablet Take 1 tablet (1 mg total) by mouth 2 (two) times daily as needed for tremors. 60 tablet 2  . gabapentin (NEURONTIN) 300 MG capsule Take 1 capsule (300 mg total) by mouth 3 (three) times daily. For agitation/neuropathy 90 capsule 2  . glipiZIDE (GLUCOTROL XL) 10 MG 24 hr tablet Take 1 tablet (10 mg total) by mouth daily. For diabetes management 14 tablet 0  . hydrOXYzine (ATARAX/VISTARIL) 10 MG tablet Take 1 tablet (10 mg total) by mouth 3 (three) times daily as needed for anxiety. 90 tablet 2  . insulin detemir (LEVEMIR) 100 UNIT/ML injection Inject 0.3 mLs (30 Units total) into the skin at bedtime. For diabetes management 10 mL 0  . insulin detemir (LEVEMIR) 100 UNIT/ML injection Inject 0.25 mLs (25 Units total) into the skin daily. For diabetes management 10 mL 0  . lisinopril (ZESTRIL) 10 MG tablet Take 1 tablet (10 mg total) by mouth daily. For high blood pressure 30 tablet 0  . paliperidone (INVEGA SUSTENNA) 156 MG/ML SUSY injection Inject 1 mL (156 mg total) into the muscle once for 1 dose. Every 28 days (Due on 07-11-20): For mood control 1 mL 11  . traZODone (DESYREL) 100 MG tablet Take 1 tablet (100 mg total) by mouth at bedtime as needed for sleep. 30 tablet 2   Current Facility-Administered Medications  Medication Dose Route Frequency Provider Last Rate Last Admin  . paliperidone (INVEGA SUSTENNA) injection 156 mg  156 mg Intramuscular Q28 days Toy Cookey E, NP   156 mg at 10/15/20 1523     Musculoskeletal: Strength & Muscle Tone: within normal limits Gait & Station: normal Patient leans: N/A  Psychiatric Specialty Exam: Review of Systems  There were no vitals taken for this visit.There is  no height or weight on file to calculate BMI.  General Appearance: Well Groomed  Eye Contact:  Good  Speech:  Clear and Coherent and Normal Rate  Volume:  Normal  Mood:  Anxious and Depressed  Affect:  Appropriate and Congruent  Thought Process:  Coherent, Goal Directed and Linear  Orientation:  Full (Time, Place, and Person)  Thought Content: WDL and Logical   Suicidal Thoughts:  No  Homicidal Thoughts:  No  Memory:  Immediate;   Good Recent;   Good Remote;   Good  Judgement:  Good  Insight:  Good  Psychomotor Activity:  Normal  Concentration:  Concentration: Good and Attention Span: Good  Recall:  Good  Fund of Knowledge: Good  Language: Good  Akathisia:  No  Handed:  Left  AIMS (if indicated): Not done  Assets:  Communication Skills Desire for Improvement Financial Resources/Insurance Housing Social  Support  ADL's:  Intact  Cognition: WNL  Sleep:  Good   Screenings: AIMS   Flowsheet Row [facility] Ad[facility] (Discharged) from OP Visit [facility] Ad[facility] (Dis[facility]) [facility] ([facility]  AIMS Total Score 0 0 0 0    AUDIT   Flowsheet Row [facility]) [facility] Admission (Discharged[facility] from OP [facility] ([facility]  Alcohol Use Disorder Identification Test Final Score (AUDIT) 0 2 0    GAD-7   Flowsheet Row Clinical Support from 10/15/2020 in [facility]  Total GAD-7 Score 12    PHQ2-9   Flowsheet Row Clinical Support from 10/15/2020 in [facility] Office Visit from  11/06/2018 in [facility] at [facility] Total Score 2 0  PHQ-9 Total Score 14 --       Assessment and Plan: Patient notes that he is doing well on Invega however notes that he is experiencing increased anxiety and depression. He is agreeable to restarting all his medications. No medication changes made today. Patient will continue all medications as prescribed.   1. Generalized anxiety disorder  Continue- hydrOXYzine (ATARAX/VISTARIL) 10 MG tablet; Take 1 tablet (10 mg total) by mouth 3 (three) times daily as needed for anxiety.  Dispense: 90 tablet; Refill: 2  2. Schizoaffective disorder, depressive type (HCC)  Continue- gabapentin (NEURONTIN) 300 MG capsule; Take 1 capsule (300 mg total) by mouth 3 (three) times daily. For agitation/neuropathy  Dispense: 90 capsule; Refill: 2 Continue- paliperidone (INVEGA SUSTENNA) 156 MG/ML SUSY injection; Inject 1 mL (156 mg total) into the muscle once for 1 dose. Every 28 days (Due on 07-11-20): For mood control  Dispense: 1 mL; Refill: 11 Continue- traZODone (DESYREL) 100 MG tablet; Take 1 tablet (100 mg total) by mouth at bedtime as needed for sleep.  Dispense: 30 tablet; Refill: 2 Continue- benztropine (COGENTIN) 1 MG tablet; Take 1 tablet (1 mg total) by mouth 2 (two) times daily as needed for tremors.  Dispense: 60 tablet; Refill: 2  Follow up in 3 months  Shanna Cisco, NP 10/15/2020, 4:51 PM

## 2020-10-15 NOTE — Progress Notes (Signed)
In for his monthly injection accompanied by his grandma with whom he lives with. She is focused on his diabetes and his excessive appetite. Explained our focus was his mental health only, to direct diabetes concerns to his endocrinologist which he sees next week. He reports he is doing well, he is not concerned with his sugar, thought when asked they are running 300-400 daily. He is very thin, weighs 140 lbs. He is pleasant and appropriate, bright affect and offers no complaints. He is working full time at [employer]. He sees the provider today and is to return in one month for his next injection.  He got his Invega 156 mg shot in his R deltoid today.

## 2020-11-06 ENCOUNTER — Ambulatory Visit (HOSPITAL_COMMUNITY): Payer: Medicaid Other | Admitting: Clinical

## 2020-11-12 ENCOUNTER — Encounter (HOSPITAL_COMMUNITY): Payer: Self-pay

## 2020-11-12 ENCOUNTER — Other Ambulatory Visit: Payer: Self-pay

## 2020-11-12 ENCOUNTER — Ambulatory Visit (HOSPITAL_COMMUNITY): Payer: Medicaid Other | Admitting: *Deleted

## 2020-11-12 VITALS — BP 127/86 | HR 91 | Ht 70.0 in | Wt 138.0 lb

## 2020-11-12 DIAGNOSIS — F251 Schizoaffective disorder, depressive type: Secondary | ICD-10-CM

## 2020-11-12 NOTE — Progress Notes (Signed)
In as scheduled for monthly injection of Invega 156 mg given today in R deltoid. He is his bright, verbal and pleasant self. He offers no complaints. States he is trying to get a job at [employer].  He continues to have unmanaged blood sugars but he has recently seen the dr that monitors it. He is to return in one month for his next injection.

## 2020-11-14 ENCOUNTER — Telehealth (HOSPITAL_COMMUNITY): Payer: Self-pay | Admitting: *Deleted

## 2020-11-14 ENCOUNTER — Telehealth (HOSPITAL_COMMUNITY): Payer: Self-pay | Admitting: Psychiatry

## 2020-11-14 NOTE — Telephone Encounter (Signed)
Provider can print a face sheet detailing patients diagnosis and medications along with a past note. If patient approves for his grandmother to have access to his records she can pick theses documents up. If patient does not approve of her having these documentation's he can pick his records up. Please have patient fill out a form detailing who can have access to his records. Marland Kitchen

## 2020-11-14 NOTE — Telephone Encounter (Signed)
Call from dr Doyne Keel who is working from home to approve releasing to grandma and patient a copy of his last note from Dr Doyne Keel which shows his diagnosis and treatment for Social Services in order to renew his medicaid.

## 2020-12-11 ENCOUNTER — Encounter (HOSPITAL_COMMUNITY): Payer: Self-pay

## 2020-12-11 ENCOUNTER — Ambulatory Visit (INDEPENDENT_AMBULATORY_CARE_PROVIDER_SITE_OTHER): Payer: Medicaid Other | Admitting: *Deleted

## 2020-12-11 ENCOUNTER — Other Ambulatory Visit: Payer: Self-pay

## 2020-12-11 VITALS — BP 122/85 | HR 76 | Ht 70.0 in | Wt 148.8 lb

## 2020-12-11 DIAGNOSIS — F251 Schizoaffective disorder, depressive type: Secondary | ICD-10-CM | POA: Diagnosis not present

## 2020-12-11 NOTE — Progress Notes (Signed)
PATIENT ARRIVED TODAY FOR INJECTION  156 MG PATIENT TOLERATED INJECTION WELL IN LEFT ARM. PATIENT STATED HE IS STILL HAVING DIFFICULTY SNACKING & HIS BLOOD SUGARS SHOW IT.  NO SI/HI NOR VH/AH.

## 2021-01-07 ENCOUNTER — Other Ambulatory Visit: Payer: Self-pay

## 2021-01-07 ENCOUNTER — Ambulatory Visit (INDEPENDENT_AMBULATORY_CARE_PROVIDER_SITE_OTHER): Payer: Medicaid Other | Admitting: Clinical

## 2021-01-07 ENCOUNTER — Ambulatory Visit (INDEPENDENT_AMBULATORY_CARE_PROVIDER_SITE_OTHER): Payer: Medicaid Other

## 2021-01-07 ENCOUNTER — Encounter (HOSPITAL_COMMUNITY): Payer: Self-pay

## 2021-01-07 DIAGNOSIS — F251 Schizoaffective disorder, depressive type: Secondary | ICD-10-CM

## 2021-01-07 NOTE — Patient Instructions (Signed)
In as scheduled for monthly injection of Invega 156 mg given today in LEFT deltoid. He is his bright, verbal and pleasant self. He offers no complaints. He is to return in one month for his next injection.

## 2021-01-08 ENCOUNTER — Ambulatory Visit (HOSPITAL_COMMUNITY): Payer: Medicaid Other

## 2021-01-10 NOTE — Progress Notes (Signed)
   THERAPIST PROGRESS NOTE  Session Time: 30 minutes  Participation Level: Active  Behavioral Response: CasualAlertEuthymic  Type of Therapy: Individual Therapy  Treatment Goals addressed: Anxiety  Interventions: CBT  Summary:  Jacob [name] is a 30 y.o. male who presents for the scheduled session oriented times five, appropriately dressed, and friendly. Client reports auditory hallucinations. Client reported on today that he is doing well. Client presented with his grandmother for the appointment. Clients grandmother reported she has concerns about the client taking care of his health and that he does not disclose things about his mental health. Client reported he has missed his previous appointments because he forgot and/ or overslept. Client reported since he was last seen he changed his job to working at [employer]. 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 Client reported he is doing well on the medication and sleeping through the night. Client reported his diabetes has not been controlled well. Client reported he eats more than he should of unhealthy foods. Client reported also using marijuana daily buying a half ounce of marijuana that last him two weeks.    Suicidal/Homicidal: Nowithout intent/plan  Therapist Response:  Therapist began the session asking the client how he has been doing since last seen. Therapist actively listened to the clients thoughts and feelings. Therapist engaged with the client to identify how the management of his physical health can negatively impact him. Therapist used CBT to discuss improving self care and encouraging the client to make changes to his diet to help. Client was scheduled for next appointment.    Plan: Return again in 5 weeks for individual therapy.  Diagnosis: Schizoaffective disorder, depressive type  Neena Rhymes Alenah Sarria, LCSW 01/07/2021

## 2021-01-14 ENCOUNTER — Telehealth (HOSPITAL_COMMUNITY): Payer: Medicaid Other | Admitting: Psychiatry

## 2021-01-14 ENCOUNTER — Other Ambulatory Visit: Payer: Self-pay

## 2021-01-27 ENCOUNTER — Encounter (HOSPITAL_COMMUNITY): Payer: Self-pay | Admitting: Psychiatry

## 2021-01-27 ENCOUNTER — Telehealth (INDEPENDENT_AMBULATORY_CARE_PROVIDER_SITE_OTHER): Payer: Medicaid Other | Admitting: Psychiatry

## 2021-01-27 ENCOUNTER — Other Ambulatory Visit: Payer: Self-pay

## 2021-01-27 DIAGNOSIS — F251 Schizoaffective disorder, depressive type: Secondary | ICD-10-CM

## 2021-01-27 DIAGNOSIS — F411 Generalized anxiety disorder: Secondary | ICD-10-CM

## 2021-01-27 MED ORDER — GABAPENTIN 300 MG PO CAPS: 300.0000 mg | ORAL_CAPSULE | Freq: Three times a day (TID) | ORAL | 2 refills | Status: DC

## 2021-01-27 MED ORDER — PALIPERIDONE PALMITATE ER 156 MG/ML IM SUSY: 156.0000 mg | PREFILLED_SYRINGE | INTRAMUSCULAR | 11 refills | Status: DC

## 2021-01-27 MED ORDER — BENZTROPINE MESYLATE 1 MG PO TABS: 1.0000 mg | ORAL_TABLET | Freq: Two times a day (BID) | ORAL | 2 refills | Status: DC | PRN

## 2021-01-27 MED ORDER — HYDROXYZINE HCL 10 MG PO TABS: 10.0000 mg | ORAL_TABLET | Freq: Three times a day (TID) | ORAL | 2 refills | Status: DC | PRN

## 2021-01-27 MED ORDER — TRAZODONE HCL 100 MG PO TABS: 100.0000 mg | ORAL_TABLET | Freq: Every evening | ORAL | 2 refills | Status: DC | PRN

## 2021-01-27 NOTE — Progress Notes (Signed)
BH MD/PA/NP OP Progress Note Virtual Visit via Telephone Note  I connected with Jacob [name] on 01/27/21 at  3:00 PM EDT by telephone and verified that I am speaking with the correct person using two identifiers.  Location: Patient: home Provider: Clinic   I discussed the limitations, risks, security and privacy concerns of performing an evaluation and management service by telephone and the availability of in person appointments. I also discussed with the patient that there may be a patient responsible charge related to this service. The patient expressed understanding and agreed to proceed.   I provided 30 minutes of non-face-to-face time during this encounter.   01/27/2021 3:18 PM Jacob [name]  MRN:  [id number]  Chief Complaint: "I like all the medications are working.  I feel good."  HPI: 30 year old male seen today for follow up psychiatric evaluation.  He has a psychiatric history of schizoaffective disorder depressive type, MDD with psychotic features and SI. He is currently being managed on Cogentin 1 mg twice daily, gabapentin 300 mg 3 times daily, hydroxyzine 25 mg 3 times daily, and Invega 156 mg every 28 days.  Today he notes his medications are effective for managing his psychiatric conditions.    Today patient was unable to log on virtually so her assessment was done over the phone.  During exam he was pleasant, cooperative, engages in and conversation.  Patient notes that since his last visit he has been doing well.  He notes that he likes his medications and feels that they are working.  Today provider conducted a GAD-7 and patient scored an 8, at his last visit he scored a 12.  Provider also conducted a PHQ-9 and patient scored a 9, at his last visit he scored a 14.  Patient notes that he often worries about what will happen in the future after his family members die.   .  Today he denies SI/HI/VAH or paranoia.  He endorses adequate sleep and appetite.  He notes that he sleeps 8  hours most nights.  Patient notes that he continues to smoke marijuana a couple times a week.  Provider informed patient that marijuana use could worsen his mental health conditions.  He endorsed understanding.   No medication changes made today.  Patient agreeable to continue medication as prescribed.  No other concerns at this time.     Visit Diagnosis:    ICD-10-CM   1. Schizoaffective disorder, depressive type (HCC)  F25.1 benztropine (COGENTIN) 1 MG tablet    gabapentin (NEURONTIN) 300 MG capsule    paliperidone (INVEGA SUSTENNA) 156 MG/ML SUSY injection    traZODone (DESYREL) 100 MG tablet  2. Generalized anxiety disorder  F41.1 hydrOXYzine (ATARAX/VISTARIL) 10 MG tablet    Past Psychiatric History: Schizoaffective disorder depressive type, MDD with psychotic features and SI  Past Medical History:  Past Medical History:  Diagnosis Date  . Diabetes mellitus without complication (HCC)    History reviewed. No pertinent surgical history.  Family Psychiatric History: Unknown  Family History: History reviewed. No pertinent family history.  Social History:  Social History   Socioeconomic History  . Marital status: Single    Spouse name: Not on file  . Number of children: Not on file  . Years of education: Not on file  . Highest education level: Not on file  Occupational History  . Not on file  Tobacco Use  . Smoking status: Never Smoker  . Smokeless tobacco: Never Used  Vaping Use  . Vaping Use: Never used  Substance and Sexual Activity  . Alcohol use: No  . Drug use: Not Currently  . Sexual activity: Not on file  Other Topics Concern  . Not on file  Social History Narrative  . Not on file   Social Determinants of Health   Financial Resource Strain: Not on file  Food Insecurity: Not on file  Transportation Needs: Not on file  Physical Activity: Not on file  Stress: Not on file  Social Connections: Not on file    Allergies: No Known Allergies  Metabolic  Disorder Labs: Lab Results  Component Value Date   HGBA1C >15.5 (H) 07/28/2020   MPG >398 07/28/2020   MPG 294.83 06/08/2020   No results found for: PROLACTIN Lab Results  Component Value Date   CHOL 178 11/16/2019   TRIG 41 11/16/2019   HDL 79 11/16/2019   CHOLHDL 2.3 11/16/2019   VLDL 8 11/16/2019   LDLCALC 91 11/16/2019   LDLCALC 114 (H) 02/06/2018   Lab Results  Component Value Date   TSH 1.568 11/16/2019   TSH 1.277 02/06/2018    Therapeutic Level Labs: No results found for: LITHIUM No results found for: VALPROATE No components found for:  CBMZ  Current Medications: Current Outpatient Medications  Medication Sig Dispense Refill  . benztropine (COGENTIN) 1 MG tablet Take 1 tablet (1 mg total) by mouth 2 (two) times daily as needed for tremors. 60 tablet 2  . gabapentin (NEURONTIN) 300 MG capsule Take 1 capsule (300 mg total) by mouth 3 (three) times daily. For agitation/neuropathy 90 capsule 2  . glipiZIDE (GLUCOTROL XL) 10 MG 24 hr tablet Take 1 tablet (10 mg total) by mouth daily. For diabetes management 14 tablet 0  . hydrOXYzine (ATARAX/VISTARIL) 10 MG tablet Take 1 tablet (10 mg total) by mouth 3 (three) times daily as needed for anxiety. 90 tablet 2  . insulin detemir (LEVEMIR) 100 UNIT/ML injection Inject 0.3 mLs (30 Units total) into the skin at bedtime. For diabetes management 10 mL 0  . insulin detemir (LEVEMIR) 100 UNIT/ML injection Inject 0.25 mLs (25 Units total) into the skin daily. For diabetes management 10 mL 0  . lisinopril (ZESTRIL) 10 MG tablet Take 1 tablet (10 mg total) by mouth daily. For high blood pressure 30 tablet 0  . paliperidone (INVEGA SUSTENNA) 156 MG/ML SUSY injection Inject 1 mL (156 mg total) into the muscle every 28 (twenty-eight) days. Every 28 days (Due on 07-11-20): For mood control 1 mL 11  . traZODone (DESYREL) 100 MG tablet Take 1 tablet (100 mg total) by mouth at bedtime as needed for sleep. 30 tablet 2   Current  Facility-Administered Medications  Medication Dose Route Frequency Provider Last Rate Last Admin  . paliperidone (INVEGA SUSTENNA) injection 156 mg  156 mg Intramuscular Q28 days Toy Cookey E, NP   156 mg at 01/07/21 1146     Musculoskeletal: Strength & Muscle Tone: Unable to assess due to telephone visit Gait & Station: Unable to assess due to telephone visit Patient leans: N/A  Psychiatric Specialty Exam: Review of Systems  There were no vitals taken for this visit.There is no height or weight on file to calculate BMI.  General Appearance: Unable to assess due to telephone visit  Eye Contact:  Unable to assess due to telephone visit  Speech:  Clear and Coherent and Normal Rate  Volume:  Normal  Mood:  Euthymic  Affect:  Appropriate and Congruent  Thought Process:  Coherent, Goal Directed and Linear  Orientation:  Full (Time, Place, and Person)  Thought Content: WDL and Logical   Suicidal Thoughts:  No  Homicidal Thoughts:  No  Memory:  Immediate;   Good Recent;   Good Remote;   Good  Judgement:  Good  Insight:  Good  Psychomotor Activity:  Normal  Concentration:  Concentration: Good and Attention Span: Good  Recall:  Good  Fund of Knowledge: Good  Language: Good  Akathisia:  No  Handed:  Left  AIMS (if indicated): Not done  Assets:  Communication Skills Desire for Improvement Financial Resources/Insurance Housing Social Support  ADL's:  Intact  Cognition: WNL  Sleep:  Good   Screenings: AIMS   Flowsheet Row [facility] Ad[facility] (Discharged) from OP [facility] Ad[facility] (Discharged) [facility] Ad[facility] (Dis[facility]  AIMS Total Score 0 0 0 0    AUDIT   Flowsheet Row Ad[facility]) [facility] Admission (Discharged) from OP [facility] Ad[facility] (Discharged[facility]  Alcohol Use Disorder Identification Test Final Score (AUDIT) 0 2 0    GAD-7   Flowsheet Row Video Visit from 01/27/2021 in [facility] Counselor from 01/07/2021 in [facility] Clinical Support from 10/15/2020 in [facility]  Total GAD-7 Score 8 15 12     PHQ2-9   Flowsheet Row Video Visit from 01/27/2021 in [name] Counselor from 01/07/2021 in [facility] Clinical Support from 10/15/2020 in [facility] Office Visit from 11/06/2018 in Primary Care at [facility] Total Score 2 4 2  0  PHQ-9 Total Score 9 16 14  --    Flowsheet Row Video Visit from 01/27/2021 in [facility] Admission (Discharged[facility] Admission (Discharged) from OP [facility]  C-SSRS RISK CATEGORY No Risk No Risk Moderate Risk       Assessment and Plan: Patient notes that he is doing well his current medication regimen.  No medication changes made today.  Patient agreeable to continue medications as prescribed. 1. Generalized anxiety disorder  Continue- hydrOXYzine (ATARAX/VISTARIL) 10 MG tablet; Take 1 tablet (10 mg total) by mouth 3 (three) times daily as needed for anxiety.  Dispense: 90 tablet; Refill: 2  2. Schizoaffective disorder, depressive type (HCC)  Continue- gabapentin (NEURONTIN) 300 MG capsule; Take 1 capsule (300 mg total) by mouth 3 (three) times daily. For agitation/neuropathy  Dispense: 90 capsule; Refill: 2 Continue- paliperidone (INVEGA SUSTENNA) 156 MG/ML SUSY  injection; Inject 1 mL (156 mg total) into the muscle once for 1 dose. Every 28 days (Due on 07-11-20): For mood control  Dispense: 1 mL; Refill: 11 Continue- traZODone (DESYREL) 100 MG tablet; Take 1 tablet (100 mg total) by mouth at bedtime as needed for sleep.  Dispense: 30 tablet; Refill: 2 Continue- benztropine (COGENTIN) 1 MG tablet; Take 1 tablet (1 mg total) by mouth 2 (two) times daily as needed for tremors.  Dispense: 60 tablet; Refill: 2  Follow up in 3 months  08/06/2020, NP 01/27/2021, 3:18 PM

## 2021-02-04 ENCOUNTER — Encounter (HOSPITAL_COMMUNITY): Payer: Self-pay

## 2021-02-04 ENCOUNTER — Other Ambulatory Visit: Payer: Self-pay

## 2021-02-04 ENCOUNTER — Ambulatory Visit (HOSPITAL_COMMUNITY): Payer: Medicaid Other | Admitting: *Deleted

## 2021-02-04 VITALS — BP 131/77 | HR 99 | Ht 70.0 in | Wt 157.0 lb

## 2021-02-04 DIAGNOSIS — F251 Schizoaffective disorder, depressive type: Secondary | ICD-10-CM

## 2021-02-04 NOTE — Progress Notes (Signed)
In as scheduled for his monthly injection of Invega S 156 mg. Today he got his shot in his R deltoid without difficulty. He is as he usually presents, pleasant, bright affect, very verbal and offers no complaints. States he is focused on staying out of trouble. He works sat, sun, and mon. He states his most recent A1C was 11, and previous to that it was 16, so he is making progress and proud of himself. No special plans for the day. He is to return in approx 28 days for his next injection. To call as needed.

## 2021-02-09 ENCOUNTER — Ambulatory Visit (HOSPITAL_COMMUNITY): Payer: Medicaid Other | Admitting: Clinical

## 2021-02-11 ENCOUNTER — Ambulatory Visit (HOSPITAL_COMMUNITY): Payer: Medicaid Other | Admitting: Clinical

## 2021-03-04 ENCOUNTER — Other Ambulatory Visit: Payer: Self-pay

## 2021-03-04 ENCOUNTER — Encounter (HOSPITAL_COMMUNITY): Payer: Self-pay

## 2021-03-04 ENCOUNTER — Ambulatory Visit (HOSPITAL_COMMUNITY): Payer: Medicaid Other | Admitting: *Deleted

## 2021-03-04 VITALS — BP 116/76 | HR 88 | Ht 70.0 in | Wt 153.0 lb

## 2021-03-04 DIAGNOSIS — F251 Schizoaffective disorder, depressive type: Secondary | ICD-10-CM

## 2021-03-04 NOTE — Progress Notes (Signed)
In as scheduled for his monthly injection of Invega S 156 mg given today in his L deltoid without difficulty. He is pleasant and appropriate but initially not as animated as he usually is. When asked he states he thought he had paid off his DUI and found out he still owes about 100.00 so he is dissapointed. He continues to work at [employer] and states he will pay it off. Praised him for what he has accomplished and for working and being very faithful in getting his shot. He declined offer of chocolate today, as staff offers it every visit to every injection patient after they get their shot. He does acknowledge his blood sugars have been up and down. He asked when he had a counseling appt and it is next week with Idalia Needle and to return in one month for his next injection.

## 2021-03-10 ENCOUNTER — Other Ambulatory Visit: Payer: Self-pay

## 2021-03-10 ENCOUNTER — Ambulatory Visit (INDEPENDENT_AMBULATORY_CARE_PROVIDER_SITE_OTHER): Payer: Medicaid Other | Admitting: Clinical

## 2021-03-10 DIAGNOSIS — F251 Schizoaffective disorder, depressive type: Secondary | ICD-10-CM | POA: Diagnosis not present
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
## 2021-04-01 ENCOUNTER — Ambulatory Visit (HOSPITAL_COMMUNITY): Payer: Medicaid Other

## 2021-04-03 ENCOUNTER — Ambulatory Visit (HOSPITAL_COMMUNITY): Payer: Medicaid Other

## 2021-04-06 ENCOUNTER — Ambulatory Visit (HOSPITAL_COMMUNITY): Payer: Medicaid Other

## 2021-04-07 ENCOUNTER — Other Ambulatory Visit: Payer: Self-pay

## 2021-04-07 ENCOUNTER — Encounter (HOSPITAL_COMMUNITY): Payer: Self-pay

## 2021-04-07 ENCOUNTER — Ambulatory Visit (INDEPENDENT_AMBULATORY_CARE_PROVIDER_SITE_OTHER): Payer: Medicaid Other | Admitting: *Deleted

## 2021-04-07 VITALS — BP 149/94 | HR 90 | Ht 70.0 in | Wt 150.0 lb

## 2021-04-07 DIAGNOSIS — F251 Schizoaffective disorder, depressive type: Secondary | ICD-10-CM | POA: Diagnosis not present

## 2021-04-07 NOTE — Progress Notes (Signed)
Patient arrived for paliperidone (INVEGA SUSTENNA) injection 156 mg. After missing a few rescheduled appointment last injection 03/04/21. Patient stated he's been visiting family in [place]. Informed that "he met a young lady & fell in love". Patient pleasant as always. Tolerated injection well in Right Arm. NO HI/SI NOR AH/VH.

## 2021-04-23 ENCOUNTER — Other Ambulatory Visit: Payer: Self-pay

## 2021-04-23 ENCOUNTER — Ambulatory Visit (INDEPENDENT_AMBULATORY_CARE_PROVIDER_SITE_OTHER): Payer: Medicaid Other | Admitting: Psychiatry

## 2021-04-23 ENCOUNTER — Encounter (HOSPITAL_COMMUNITY): Payer: Self-pay | Admitting: Psychiatry

## 2021-04-23 DIAGNOSIS — F251 Schizoaffective disorder, depressive type: Secondary | ICD-10-CM | POA: Diagnosis not present

## 2021-04-23 MED ORDER — GABAPENTIN 300 MG PO CAPS: 300.0000 mg | ORAL_CAPSULE | Freq: Three times a day (TID) | ORAL | 2 refills | Status: DC

## 2021-04-23 MED ORDER — BENZTROPINE MESYLATE 1 MG PO TABS: 1.0000 mg | ORAL_TABLET | Freq: Two times a day (BID) | ORAL | 2 refills | Status: DC | PRN

## 2021-04-23 MED ORDER — TRAZODONE HCL 100 MG PO TABS: 100.0000 mg | ORAL_TABLET | Freq: Every evening | ORAL | 2 refills | Status: DC | PRN

## 2021-04-23 MED ORDER — PALIPERIDONE PALMITATE ER 156 MG/ML IM SUSY: 156.0000 mg | PREFILLED_SYRINGE | INTRAMUSCULAR | 11 refills | Status: DC

## 2021-04-23 NOTE — Progress Notes (Signed)
BH MD/PA/NP OP Progress Note    04/23/2021 10:48 AM Jacob [name]  MRN:  [id number]  Chief Complaint: "I have some swelling in one of my nipples"  HPI: 30 year old male seen today for follow up psychiatric evaluation.  He has a psychiatric history of schizoaffective disorder depressive type, MDD with psychotic features and SI. He is currently being managed on Cogentin 1 mg twice daily, gabapentin 300 mg 3 times daily, hydroxyzine 25 mg 3 times daily, and Invega 156 mg every 28 days.  Today he notes his medications are effective for managing his psychiatric conditions.    Today patient was well-groomed, pleasant, cooperative, engages in conversation, and maintained eye contact.  He informed Clinical research associate that his mental health is stable.  He however notes that he is concerned because he has swelling in one of his nipples.  He informed Clinical research associate that he plans to schedule follow-up appointment with his PCP.  Patient pulled up his shirt and showed provider chest.  Patient right nipple was notably larger than his left.  He denies any pain.  Patient informed provider that his anxiety and depression are well managed on his current medication regimen.  Provider conducted a GAD-7 and patient scored a 6, at his last visit he scored an 8.  Provider also conducted a PHQ-9 and patient scored a 0, at his last visit he scored a 9.  He endorses adequate appetite and sleep.  Today he denies SI/HI/VAH, mania, or paranoia.    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  He notes that he is looking forward to getting back to work.  He also informed provider that he has an appointment with his endocrinologist tomorrow to discuss his diabetes.  He informed Clinical research associate that he is considering rescheduling his appointment so that he can go to his job interview.  Patient notes that he reduced his marijuana consumption to once a month.  He informed Clinical research associate that the last time he smoked was this  Monday, however he notes that he does not plan to smoke anymore this month.   No medication changes made today.  Patient agreeable to continue medication as prescribed.  No other concerns at this time.      Visit Diagnosis:    ICD-10-CM   1. Schizoaffective disorder, depressive type (HCC)  F25.1 benztropine (COGENTIN) 1 MG tablet    gabapentin (NEURONTIN) 300 MG capsule    paliperidone (INVEGA SUSTENNA) 156 MG/ML SUSY injection    traZODone (DESYREL) 100 MG tablet      Past Psychiatric History: Schizoaffective disorder depressive type, MDD with psychotic features and SI  Past Medical History:  Past Medical History:  Diagnosis Date   Diabetes mellitus without complication (HCC)    No past surgical history on file.  Family Psychiatric History: Unknown  Family History: No family history on file.  Social History:  Social History   Socioeconomic History   Marital status: Single    Spouse name: Not on file   Number of children: Not on file   Years of education: Not on file   Highest education level: Not on file  Occupational History   Not on file  Tobacco Use   Smoking status: Never   Smokeless tobacco: Never  Vaping Use   Vaping Use: Never used  Substance and Sexual Activity   Alcohol use: No   Drug use: Not Currently   Sexual activity: Not on file  Other Topics Concern   Not on  file  Social History Narrative   Not on file   Social Determinants of Health   Financial Resource Strain: Not on file  Food Insecurity: Not on file  Transportation Needs: Not on file  Physical Activity: Not on file  Stress: Not on file  Social Connections: Not on file    Allergies: No Known Allergies  Metabolic Disorder Labs: Lab Results  Component Value Date   HGBA1C >15.5 (H) 07/28/2020   MPG >398 07/28/2020   MPG 294.83 06/08/2020   No results found for: PROLACTIN Lab Results  Component Value Date   CHOL 178 11/16/2019   TRIG 41 11/16/2019   HDL 79 11/16/2019   CHOLHDL  2.3 11/16/2019   VLDL 8 11/16/2019   LDLCALC 91 11/16/2019   LDLCALC 114 (H) 02/06/2018   Lab Results  Component Value Date   TSH 1.568 11/16/2019   TSH 1.277 02/06/2018    Therapeutic Level Labs: No results found for: LITHIUM No results found for: VALPROATE No components found for:  CBMZ  Current Medications: Current Outpatient Medications  Medication Sig Dispense Refill   benztropine (COGENTIN) 1 MG tablet Take 1 tablet (1 mg total) by mouth 2 (two) times daily as needed for tremors. 60 tablet 2   gabapentin (NEURONTIN) 300 MG capsule Take 1 capsule (300 mg total) by mouth 3 (three) times daily. For agitation/neuropathy 90 capsule 2   glipiZIDE (GLUCOTROL XL) 10 MG 24 hr tablet Take 1 tablet (10 mg total) by mouth daily. For diabetes management 14 tablet 0   hydrOXYzine (ATARAX/VISTARIL) 10 MG tablet Take 1 tablet (10 mg total) by mouth 3 (three) times daily as needed for anxiety. 90 tablet 2   insulin detemir (LEVEMIR) 100 UNIT/ML injection Inject 0.3 mLs (30 Units total) into the skin at bedtime. For diabetes management 10 mL 0   insulin detemir (LEVEMIR) 100 UNIT/ML injection Inject 0.25 mLs (25 Units total) into the skin daily. For diabetes management 10 mL 0   lisinopril (ZESTRIL) 10 MG tablet Take 1 tablet (10 mg total) by mouth daily. For high blood pressure 30 tablet 0   paliperidone (INVEGA SUSTENNA) 156 MG/ML SUSY injection Inject 1 mL (156 mg total) into the muscle every 28 (twenty-eight) days. Every 28 days (Due on 07-11-20): For mood control 1 mL 11   traZODone (DESYREL) 100 MG tablet Take 1 tablet (100 mg total) by mouth at bedtime as needed for sleep. 30 tablet 2   Current Facility-Administered Medications  Medication Dose Route Frequency Provider Last Rate Last Admin   paliperidone (INVEGA SUSTENNA) injection 156 mg  156 mg Intramuscular Q28 days Toy Cookey E, NP   156 mg at 04/07/21 1145     Musculoskeletal: Strength & Muscle Tone: within normal  limits Gait & Station: normal Patient leans: N/A  Psychiatric Specialty Exam: Review of Systems  Blood pressure 122/67, pulse 84, height 5\' 10"  (1.778 m), weight 146 lb (66.2 kg), SpO2 99 %.Body mass index is 20.95 kg/m.  General Appearance: Well Groomed  Eye Contact:  Good  Speech:  Clear and Coherent and Normal Rate  Volume:  Normal  Mood:  Euthymic  Affect:  Appropriate and Congruent  Thought Process:  Coherent, Goal Directed and Linear  Orientation:  Full (Time, Place, and Person)  Thought Content: WDL and Logical   Suicidal Thoughts:  No  Homicidal Thoughts:  No  Memory:  Immediate;   Good Recent;   Good Remote;   Good  Judgement:  Good  Insight:  Good  Psychomotor Activity:  Normal  Concentration:  Concentration: Good and Attention Span: Good  Recall:  Good  Fund of Knowledge: Good  Language: Good  Akathisia:  No  Handed:  Left  AIMS (if indicated): Not done  Assets:  Communication Skills Desire for Improvement Financial Resources/Insurance Housing Social Support  ADL's:  Intact  Cognition: WNL  Sleep:  Good   Screenings: AIMS    Flowsheet Row [facility] Ad[facility] (Discharged) from OP [facility] (Dis[facility]) [facility] ([facility]) [facility]  AIMS Total Score 0 0 0 0      AUDIT    Flowsheet Row [facility]) [facility] Admission (Discharged) from OP [facility] Ad[facility] (Dis[facility]  Alcohol Use Disorder Identification Test Final Score (AUDIT) 0 2 0      GAD-7    Flowsheet Row Clinical Support from 04/23/2021 in  [facility] Video Visit from 01/27/2021 in [facility] Counselor from 01/07/2021 in [facility] Clinical Support from 10/15/2020 in [facility]  Total GAD-7 Score 6 8 15 12       PHQ2-9    Flowsheet Row Clinical Support from 04/23/2021 in [facility] Video Visit from 01/27/2021 in [facility] Counselor from 01/07/2021 in [facility] Clinical Support from 10/15/2020 in [facility] Office Visit from 11/06/2018 in [facility] at [facility] Total Score 0 2 4 2  0  PHQ-9 Total Score 0 9 16 14  --      Flowsheet Row Clinical Support from 04/23/2021 in [facility] Video Visit from 01/27/2021 in [facility] (Discharged[facility]  C-SSRS RISK CATEGORY No Risk No Risk No Risk        Assessment and Plan: Patient notes that he is doing well his current medication regimen.  No medication changes made today.  Patient agreeable to continue medications as prescribed. 1. Generalized anxiety disorder  Continue- hydrOXYzine (ATARAX/VISTARIL) 10 MG tablet; Take 1 tablet (10 mg total) by mouth 3 (three) times daily as needed for anxiety.  Dispense: 90 tablet; Refill: 2  2. Schizoaffective disorder, depressive type (HCC)  Continue- gabapentin (NEURONTIN) 300 MG capsule; Take 1 capsule (300 mg total) by mouth 3 (three) times daily. For agitation/neuropathy  Dispense: 90 capsule; Refill: 2 Continue- paliperidone (INVEGA SUSTENNA) 156 MG/ML SUSY injection; Inject 1 mL (156 mg total) into the muscle once for 1 dose. Every 28 days (Due on 07-11-20): For mood control  Dispense: 1 mL; Refill: 11 Continue- traZODone (DESYREL) 100 MG tablet; Take 1 tablet (100 mg total) by mouth  at bedtime as needed for sleep.  Dispense: 30 tablet; Refill: 2 Continue- benztropine (COGENTIN) 1 MG tablet; Take 1 tablet (1 mg total) by mouth 2 (two) times daily as needed for tremors.  Dispense: 60 tablet; Refill: 2  Follow up in 3 months  [facility], NP 04/23/2021, 10:48 AM

## 2021-04-29 ENCOUNTER — Other Ambulatory Visit: Payer: Self-pay

## 2021-04-29 ENCOUNTER — Ambulatory Visit (HOSPITAL_COMMUNITY): Payer: Medicaid Other | Admitting: Clinical

## 2021-05-05 ENCOUNTER — Emergency Department (HOSPITAL_COMMUNITY)
Admission: EM | Admit: 2021-05-05 | Discharge: 2021-05-05 | Disposition: A | Discharge status: Home / Self Care | Payer: Medicaid Other | Attending: Emergency Medicine | Admitting: Emergency Medicine

## 2021-05-05 ENCOUNTER — Encounter (HOSPITAL_COMMUNITY): Payer: Self-pay | Admitting: *Deleted

## 2021-05-05 DIAGNOSIS — E1165 Type 2 diabetes mellitus with hyperglycemia: Secondary | ICD-10-CM | POA: Diagnosis not present

## 2021-05-05 DIAGNOSIS — Z7984 Long term (current) use of oral hypoglycemic drugs: Secondary | ICD-10-CM | POA: Diagnosis not present

## 2021-05-05 DIAGNOSIS — R739 Hyperglycemia, unspecified: Secondary | ICD-10-CM

## 2021-05-05 DIAGNOSIS — Z794 Long term (current) use of insulin: Secondary | ICD-10-CM | POA: Insufficient documentation

## 2021-05-05 DIAGNOSIS — E1110 Type 2 diabetes mellitus with ketoacidosis without coma: Secondary | ICD-10-CM | POA: Insufficient documentation

## 2021-05-05 LAB — CBC WITH DIFFERENTIAL/PLATELET
Abs Immature Granulocytes: 0.01 K/uL (ref 0.00–0.07)
Basophils Absolute: 0 K/uL (ref 0.0–0.1)
Basophils Relative: 1 %
Eosinophils Absolute: 0.1 K/uL (ref 0.0–0.5)
Eosinophils Relative: 2 %
HCT: 43.7 % (ref 39.0–52.0)
Hemoglobin: 15.1 g/dL (ref 13.0–17.0)
Immature Granulocytes: 0 %
Lymphocytes Relative: 51 %
Lymphs Abs: 2.3 K/uL (ref 0.7–4.0)
MCH: 28.9 pg (ref 26.0–34.0)
MCHC: 34.6 g/dL (ref 30.0–36.0)
MCV: 83.7 fL (ref 80.0–100.0)
Monocytes Absolute: 0.4 K/uL (ref 0.1–1.0)
Monocytes Relative: 9 %
Neutro Abs: 1.6 K/uL — ABNORMAL LOW (ref 1.7–7.7)
Neutrophils Relative %: 37 %
Platelets: 290 K/uL (ref 150–400)
RBC: 5.22 MIL/uL (ref 4.22–5.81)
RDW: 12.5 % (ref 11.5–15.5)
WBC: 4.4 K/uL (ref 4.0–10.5)
nRBC: 0 % (ref 0.0–0.2)

## 2021-05-05 LAB — BLOOD GAS, VENOUS
Acid-base deficit: 3.5 mmol/L — ABNORMAL HIGH (ref 0.0–2.0)
Bicarbonate: 20 mmol/L (ref 20.0–28.0)
O2 Saturation: 99.6 %
Patient temperature: 98.6
pCO2, Ven: 33.3 mmHg — ABNORMAL LOW (ref 44.0–60.0)
pH, Ven: 7.396 (ref 7.250–7.430)
pO2, Ven: 188 mmHg — ABNORMAL HIGH (ref 32.0–45.0)

## 2021-05-05 LAB — BASIC METABOLIC PANEL
Anion gap: 15 (ref 5–15)
BUN: 15 mg/dL (ref 6–20)
CO2: 21 mmol/L — ABNORMAL LOW (ref 22–32)
Calcium: 9.6 mg/dL (ref 8.9–10.3)
Chloride: 97 mmol/L — ABNORMAL LOW (ref 98–111)
Creatinine, Ser: 0.87 mg/dL (ref 0.61–1.24)
GFR, Estimated: >60 mL/min (ref >60)
Glucose, Bld: 185 mg/dL — ABNORMAL HIGH (ref 70–99)
Potassium: 3.9 mmol/L (ref 3.5–5.1)
Sodium: 133 mmol/L — ABNORMAL LOW (ref 135–145)

## 2021-05-05 LAB — BETA-HYDROXYBUTYRIC ACID: Beta-Hydroxybutyric Acid: 3.88 mmol/L — ABNORMAL HIGH (ref 0.05–0.27)

## 2021-05-05 LAB — CBG MONITORING, ED: Glucose-Capillary: 187 mg/dL — ABNORMAL HIGH (ref 70–99)

## 2021-05-05 MED ORDER — LACTATED RINGERS IV BOLUS
1000.0000 mL | Freq: Once | INTRAVENOUS | Status: AC
  Administered 2021-05-05: 1000 mL via INTRAVENOUS

## 2021-05-05 NOTE — Discharge Instructions (Addendum)
Be sure to take your insulin as prescribed.  If you develop increased thirst, increased urination, increased fatigue, fever, vomiting, or any other new/concerning symptoms then return to the ER for evaluation.

## 2021-05-05 NOTE — ED Triage Notes (Signed)
Pt sent from endocrinologist d/t concern for DKA. He had large ketones in urine, was nauseas and vomiting last night. CBG was 321 in office.

## 2021-05-05 NOTE — ED Provider Notes (Signed)
[facility]-EMERGENCY DEPT Provider Note   CSN: [id number] Arrival date & time: 05/05/21  1219     History Chief Complaint  Patient presents with   Hyperglycemia    Jacob [name] is a 30 y.o. male.  HPI 30 year old male presents with hyperglycemia and concern for DKA.  He went to his endocrinologist and he had a lot of ketones in his urine so they sent him to the ER.  His glucose has been running in the 300s and has been vomiting after he eats for the last 2 or 3 days.  Otherwise he does not feel too bad.  No fevers, dyspnea, abdominal pain.  This is what it has felt like to be in DKA before.  He does endorse that he is missing insulin doses at home.  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 No pertinent surgical history.     No family history on file.  Social History   Tobacco Use   Smoking status: Never   Smokeless tobacco: Never  Vaping Use   Vaping Use: Never used  Substance Use Topics   Alcohol use: No   Drug use: Not Currently    Home Medications Prior to Admission medications   Medication Sig Start Date End Date Taking? Authorizing Provider  Continuous Blood Gluc Receiver (DEXCOM G6 RECEIVER) DEVI 1 each by Continuous infusion (non-IV) route See admin instructions. Use as directed for continuous blood glucose monitoring. 11/18/20  Yes [provider]  Continuous Blood Gluc Sensor (DEXCOM G6 SENSOR) MISC 1 each by Other route See admin instructions. Every 10 days. 02/04/21  Yes [provider]   Continuous Blood Gluc Transmit (DEXCOM G6 TRANSMITTER) MISC 1 each by Other route See admin instructions. Use as directed for continuous glucose monitoring. Use Transmitter for 90 days. Discard and replace. 02/16/21  Yes [provider]  hydrOXYzine (ATARAX/VISTARIL) 10 MG tablet Take 1 tablet (10 mg total) by mouth 3 (three) times daily as needed for anxiety. 01/27/21  Yes Toy Cookey E, NP  insulin aspart protamine - aspart (NOVOLOG 70/30 MIX) (70-30) 100 UNIT/ML FlexPen Inject 24-28 Units into the skin See admin instructions. Administer 28 units at breakfast and 24 units with dinner. 05/05/21  Yes [provider]  paliperidone (INVEGA SUSTENNA) 156 MG/ML SUSY injection Inject 1 mL (156 mg total) into the muscle every 28 (twenty-eight) days. Every 28 days (Due on 07-11-20): For mood control 04/23/21  Yes Toy Cookey E, NP  traZODone (DESYREL) 100 MG tablet Take 1 tablet (100 mg total) by mouth at bedtime as needed for sleep. 04/23/21  Yes Toy Cookey E, NP  benztropine (COGENTIN) 1 MG tablet Take 1 tablet (1 mg total) by mouth 2 (two) times daily as needed for tremors. Patient not taking: No sig reported 04/23/21   Toy Cookey E, NP  gabapentin (NEURONTIN) 300 MG capsule Take 1 capsule (300 mg total) by mouth 3 (three) times daily. For agitation/neuropathy Patient not taking: No sig reported 04/23/21   Toy Cookey E, NP  glipiZIDE (GLUCOTROL XL) 10 MG 24 hr  tablet Take 1 tablet (10 mg total) by mouth daily. For diabetes management Patient not taking: No sig reported 06/12/20   Armandina Stammer I, NP  insulin detemir (LEVEMIR) 100 UNIT/ML injection Inject 0.3 mLs (30 Units total) into the skin at bedtime. For diabetes management Patient not taking: No sig reported 06/12/20   Armandina Stammer I, NP  insulin detemir (LEVEMIR) 100 UNIT/ML injection Inject 0.25 mLs (25 Units total) into the skin daily. For diabetes management Patient not taking: No sig reported 06/13/20    Armandina Stammer I, NP  lisinopril (ZESTRIL) 10 MG tablet Take 1 tablet (10 mg total) by mouth daily. For high blood pressure Patient not taking: No sig reported 06/13/20   Sanjuana Kava, NP    Allergies    Patient has no known allergies.  Review of Systems   Review of Systems  Constitutional:  Positive for unexpected weight change (lost multiple pounds over past few months). Negative for fever.  Respiratory:  Negative for shortness of breath.   Gastrointestinal:  Positive for vomiting. Negative for abdominal pain.  All other systems reviewed and are negative.  Physical Exam Updated Vital Signs BP 102/68   Pulse 69   Temp (!) 97.5 F (36.4 C) (Oral)   Resp 18   SpO2 98%   Physical Exam Vitals and nursing note reviewed.  Constitutional:      General: He is not in acute distress.    Appearance: He is well-developed and underweight. He is not ill-appearing or diaphoretic.  HENT:     Head: Normocephalic and atraumatic.     Right Ear: External ear normal.     Left Ear: External ear normal.     Nose: Nose normal.  Eyes:     General:        Right eye: No discharge.        Left eye: No discharge.  Cardiovascular:     Rate and Rhythm: Normal rate and regular rhythm.     Heart sounds: Normal heart sounds.  Pulmonary:     Effort: Pulmonary effort is normal.     Breath sounds: Normal breath sounds.  Abdominal:     General: There is no distension.     Palpations: Abdomen is soft.     Tenderness: There is no abdominal tenderness.  Musculoskeletal:     Cervical back: Neck supple.  Skin:    General: Skin is warm and dry.  Neurological:     Mental Status: He is alert.  Psychiatric:        Mood and Affect: Mood is not anxious.    ED Results / Procedures / Treatments   Labs (all labs ordered are listed, but only abnormal results are displayed) Labs Reviewed  BLOOD GAS, VENOUS - Abnormal; Notable for the following components:      Result Value   pCO2, Ven 33.3 (*)    pO2, Ven  188.0 (*)    Acid-base deficit 3.5 (*)    All other components within normal limits  BETA-HYDROXYBUTYRIC ACID - Abnormal; Notable for the following components:   Beta-Hydroxybutyric Acid 3.88 (*)    All other components within normal limits  BASIC METABOLIC PANEL - Abnormal; Notable for the following components:   Sodium 133 (*)    Chloride 97 (*)    CO2 21 (*)    Glucose, Bld 185 (*)    All other components within normal limits  CBC WITH DIFFERENTIAL/PLATELET - Abnormal; Notable for the following components:   Neutro Abs  1.6 (*)    All other components within normal limits  CBG MONITORING, ED - Abnormal; Notable for the following components:   Glucose-Capillary 187 (*)    All other components within normal limits  URINALYSIS, ROUTINE W REFLEX MICROSCOPIC    EKG EKG Interpretation  Date/Time:  Tuesday May 05 2021 13:45:07 EDT Ventricular Rate:  79 PR Interval:  158 QRS Duration: 84 QT Interval:  356 QTC Calculation: 408 R Axis:   97 Text Interpretation: Sinus rhythm Consider right atrial enlargement Borderline right axis deviation ST elevation suggests earoly repolarization Confirmed by Pricilla Loveless [phone]) on 05/05/2021 1:53:31 PM  Radiology No results found.  Procedures Procedures   Medications Ordered in ED Medications  lactated ringers bolus 1,000 mL (0 mLs Intravenous Stopped 05/05/21 1449)    ED Course  I have reviewed the triage vital signs and the nursing notes.  Pertinent labs & imaging results that were available during my care of the patient were reviewed by me and considered in my medical decision making (see chart for details).    MDM Rules/Calculators/A&P                          Patient has hyperglycemia and evidence of ketones but at the same time his pH is 7.4 with an anion gap of 15.  I think this is all borderline.  He took insulin prior to arrival.  His vital signs are stable.  I do not think he needs to be admitted on an insulin drip and I  think can be discharged home to follow-up closely with his PCP and endocrinologist.  We discussed that this is borderline and if at any point his symptoms worsen he needs to return to the hospital for evaluation. Final Clinical Impression(s) / ED Diagnoses Final diagnoses:  Hyperglycemia    Rx / DC Orders ED Discharge Orders     None        Pricilla Loveless, MD 05/05/21 1504

## 2021-05-07 ENCOUNTER — Encounter (HOSPITAL_COMMUNITY): Payer: Self-pay

## 2021-05-07 ENCOUNTER — Ambulatory Visit (HOSPITAL_COMMUNITY): Payer: Medicaid Other | Admitting: *Deleted

## 2021-05-07 ENCOUNTER — Other Ambulatory Visit: Payer: Self-pay

## 2021-05-07 VITALS — BP 123/92 | HR 89 | Ht 70.0 in | Wt 147.0 lb

## 2021-05-07 DIAGNOSIS — F251 Schizoaffective disorder, depressive type: Secondary | ICD-10-CM

## 2021-05-07 NOTE — Progress Notes (Signed)
In as scheduled for his monthly injection of Invega S 156 mg. Today he got his injection in his L DELTOID without difficulty. He went to the ED recently for an elevated sugar, treated with IV fluids and returned home. Today when he tested his glucose he said it was 300 something. He is no longer speaking to the woman he went to see in [place], but now is talking with a woman in [place]. He is pleasant and appropriate and offers no complaints. He seems calm today, states he is feeling well today and has no special plans today other than helping his grandma who he lives with and watching TV. Explained changing shot times to clinic times on Tues am and Thurs pm. He is currently not working and can come on Tues am. TO return in one month.

## 2021-05-19 ENCOUNTER — Ambulatory Visit (HOSPITAL_COMMUNITY): Payer: Self-pay | Admitting: Clinical

## 2021-05-25 ENCOUNTER — Other Ambulatory Visit: Payer: Self-pay | Admitting: Physician Assistant

## 2021-05-25 DIAGNOSIS — N644 Mastodynia: Secondary | ICD-10-CM

## 2021-05-25 DIAGNOSIS — N63 Unspecified lump in unspecified breast: Secondary | ICD-10-CM

## 2021-06-02 ENCOUNTER — Ambulatory Visit (HOSPITAL_COMMUNITY): Payer: Medicaid Other

## 2021-06-03 ENCOUNTER — Ambulatory Visit (HOSPITAL_COMMUNITY): Payer: Medicaid Other | Admitting: *Deleted

## 2021-06-03 ENCOUNTER — Encounter (HOSPITAL_COMMUNITY): Payer: Self-pay

## 2021-06-03 ENCOUNTER — Other Ambulatory Visit: Payer: Self-pay

## 2021-06-03 VITALS — BP 113/89 | HR 89 | Ht 70.0 in | Wt 154.0 lb

## 2021-06-03 DIAGNOSIS — F251 Schizoaffective disorder, depressive type: Secondary | ICD-10-CM

## 2021-06-03 MED ORDER — PALIPERIDONE PALMITATE ER 156 MG/ML IM SUSY
156.0000 mg | PREFILLED_SYRINGE | INTRAMUSCULAR | Status: DC
  Administered 2021-06-03 – 2021-09-28 (×5): 156 mg via INTRAMUSCULAR

## 2021-06-03 NOTE — Progress Notes (Signed)
In as scheduled for injection of Invega S 156 mg, He was given his injection in his R DELTOID without difficulty. He is no longer speaking to the woman in [place], he offers no complaints re his illness and denies any current sx. He is his pleasant self. Grandma brought him to his appt but waited for him in the car. He is not currently working. Encouraged him to find a low stress job and work part time till he builds up his confidence and his endurance and sees how part time work effects his illness. Encouraged him to try to work. He is to return in one month for his next shot.

## 2021-06-24 ENCOUNTER — Encounter (HOSPITAL_COMMUNITY): Payer: Self-pay

## 2021-06-24 ENCOUNTER — Other Ambulatory Visit: Payer: Self-pay

## 2021-06-24 ENCOUNTER — Emergency Department (HOSPITAL_COMMUNITY)
Admission: EM | Admit: 2021-06-24 | Discharge: 2021-06-25 | Disposition: A | Discharge status: Home / Self Care | Payer: Medicaid Other | Attending: Emergency Medicine | Admitting: Emergency Medicine

## 2021-06-24 DIAGNOSIS — R109 Unspecified abdominal pain: Secondary | ICD-10-CM | POA: Diagnosis not present

## 2021-06-24 DIAGNOSIS — E1110 Type 2 diabetes mellitus with ketoacidosis without coma: Secondary | ICD-10-CM | POA: Diagnosis not present

## 2021-06-24 DIAGNOSIS — Z79899 Other long term (current) drug therapy: Secondary | ICD-10-CM | POA: Insufficient documentation

## 2021-06-24 DIAGNOSIS — Z794 Long term (current) use of insulin: Secondary | ICD-10-CM | POA: Diagnosis not present

## 2021-06-24 DIAGNOSIS — Z7984 Long term (current) use of oral hypoglycemic drugs: Secondary | ICD-10-CM | POA: Diagnosis not present

## 2021-06-24 MED ORDER — KETOROLAC TROMETHAMINE 30 MG/ML IJ SOLN
30.0000 mg | Freq: Once | INTRAMUSCULAR | Status: AC
  Administered 2021-06-24: 30 mg via INTRAMUSCULAR
  Filled 2021-06-24: qty 1

## 2021-06-24 NOTE — ED Triage Notes (Signed)
Left flank pain that started today. Denies urinary symptoms.

## 2021-06-24 NOTE — ED Provider Notes (Signed)
[facility]-EMERGENCY DEPT Provider Note   CSN: [id number] Arrival date & time: 06/24/21  2207     History Chief Complaint  Patient presents with   Flank Pain    Jacob [name] is a 30 y.o. male.  HPI Patient presents with flank pain.  Onset was today, no clear precipitant.  Since onset symptoms have been persistent, though intermittent severity, crampy sensation, left flank, nonradiating, no abdominal pain, penis pain, scrotal discomfort, dysuria, hematuria, no history of kidney stones.  On patient does have a history of insulin-dependent diabetes.     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 No pertinent surgical history.     History reviewed. No pertinent family history.  Social History   Tobacco Use   Smoking status: Never   Smokeless tobacco: Never  Vaping Use   Vaping Use: Never used  Substance Use Topics   Alcohol use: No   Drug use: Not Currently    Home Medications Prior to Admission medications   Medication Sig Start Date End Date Taking? Authorizing Provider  benztropine (COGENTIN) 1 MG tablet Take 1 tablet (1 mg total) by mouth 2 (two) times daily as needed for tremors. Patient not taking: No sig reported 04/23/21   Shanna Cisco, NP  Continuous Blood Gluc Receiver (DEXCOM G6 RECEIVER) DEVI 1 each by Continuous infusion (non-IV) route See admin instructions. Use as directed for continuous blood glucose monitoring. 11/18/20   [provider]  Continuous Blood Gluc Sensor (DEXCOM G6 SENSOR)  MISC 1 each by Other route See admin instructions. Every 10 days. 02/04/21   [provider]  Continuous Blood Gluc Transmit (DEXCOM G6 TRANSMITTER) MISC 1 each by Other route See admin instructions. Use as directed for continuous glucose monitoring. Use Transmitter for 90 days. Discard and replace. 02/16/21   [provider]  gabapentin (NEURONTIN) 300 MG capsule Take 1 capsule (300 mg total) by mouth 3 (three) times daily. For agitation/neuropathy Patient not taking: No sig reported 04/23/21   Toy Cookey E, NP  glipiZIDE (GLUCOTROL XL) 10 MG 24 hr tablet Take 1 tablet (10 mg total) by mouth daily. For diabetes management Patient not taking: No sig reported 06/12/20   Armandina Stammer I, NP  hydrOXYzine (ATARAX/VISTARIL) 10 MG tablet Take 1 tablet (10 mg total) by mouth 3 (three) times daily as needed for anxiety. 01/27/21   Shanna Cisco, NP  insulin aspart protamine - aspart (NOVOLOG 70/30 MIX) (70-30) 100 UNIT/ML FlexPen Inject 24-28 Units into the skin See admin instructions. Administer 28 units at breakfast and 24 units with dinner. 05/05/21   [provider]  insulin detemir (LEVEMIR) 100 UNIT/ML injection Inject 0.3 mLs (30 Units total) into the skin at bedtime. For diabetes management Patient not taking: No sig reported 06/12/20   Armandina Stammer I, NP  insulin detemir (LEVEMIR) 100 UNIT/ML injection Inject 0.25 mLs (25 Units total) into the skin daily. For diabetes management Patient not taking: No sig reported 06/13/20   Armandina Stammer I, NP  lisinopril (ZESTRIL) 10 MG tablet Take 1 tablet (  10 mg total) by mouth daily. For high blood pressure Patient not taking: No sig reported 06/13/20   Armandina Stammer I, NP  paliperidone (INVEGA SUSTENNA) 156 MG/ML SUSY injection Inject 1 mL (156 mg total) into the muscle every 28 (twenty-eight) days. Every 28 days (Due on 07-11-20): For mood control 04/23/21   Shanna Cisco, NP  traZODone (DESYREL) 100 MG tablet Take 1 tablet (100  mg total) by mouth at bedtime as needed for sleep. 04/23/21   Shanna Cisco, NP    Allergies    Patient has no known allergies.  Review of Systems   Review of Systems  Constitutional:        Per HPI, otherwise negative  HENT:         Per HPI, otherwise negative  Respiratory:         Per HPI, otherwise negative  Cardiovascular:        Per HPI, otherwise negative  Gastrointestinal:  Negative for vomiting.  Endocrine:       Negative aside from HPI  Genitourinary:        Neg aside from HPI   Musculoskeletal:        Per HPI, otherwise negative  Skin: Negative.   Neurological:  Negative for syncope.   Physical Exam Updated Vital Signs BP 137/83 (BP Location: Left Arm)   Pulse 77   Temp 98.3 F (36.8 C) (Oral)   Resp 18   Ht 5\' 10"  (1.778 m)   Wt 69 kg   SpO2 98%   BMI 21.83 kg/m   Physical Exam Vitals and nursing note reviewed.  Constitutional:      General: He is not in acute distress.    Appearance: He is well-developed.  HENT:     Head: Normocephalic and atraumatic.  Eyes:     Conjunctiva/sclera: Conjunctivae normal.  Cardiovascular:     Rate and Rhythm: Normal rate and regular rhythm.  Pulmonary:     Effort: Pulmonary effort is normal. No respiratory distress.     Breath sounds: No stridor.  Abdominal:     General: There is no distension.     Tenderness: There is no abdominal tenderness. There is no guarding.  Musculoskeletal:     Comments: No back deformity, crepitus, step-off, no tenderness palpation flanks  Skin:    General: Skin is warm and dry.  Neurological:     Mental Status: He is alert and oriented to person, place, and time.     Comments: Unremarkable neurologic exam lower extremities    ED Results / Procedures / Treatments   Labs (all labs ordered are listed, but only abnormal results are displayed) Labs Reviewed  URINALYSIS, ROUTINE W REFLEX MICROSCOPIC     Radiology No results found.  Procedures Procedures   Medications  Ordered in ED Medications  ketorolac (TORADOL) 30 MG/ML injection 30 mg (has no administration in time range)    ED Course  I have reviewed the triage vital signs and the nursing notes.  Pertinent labs & imaging results that were available during my care of the patient were reviewed by me and considered in my medical decision making (see chart for details).  Adult male presents with new flank pain.  Patient is awake, alert, in no distress, has no neurodeficits, has no fever, chills, nausea, vomiting, hematuria.  However, nephrolithiasis versus musculoskeletal etiology are to primary considerations and patient is awaiting urinalysis on signout.  Final Clinical Impression(s) / ED Diagnoses Final diagnoses:  Left flank pain  Gerhard Munch, MD 06/25/21 [phone]

## 2021-06-24 NOTE — ED Provider Notes (Signed)
Patient care assumed at 2330. Patient here for evaluation of flank pain. UA is pending.  UA is unremarkable. No evidence of UTI. No hemoglobin present. On assessment patient is comfortable appearing in no acute distress. Plan to treat for musculoskeletal pain with NSAIDs.   Tilden Fossa, MD 06/25/21 [phone]

## 2021-06-25 LAB — URINALYSIS, ROUTINE W REFLEX MICROSCOPIC
Bilirubin Urine: NEGATIVE
Glucose, UA: NEGATIVE mg/dL
Hgb urine dipstick: NEGATIVE
Ketones, ur: NEGATIVE mg/dL
Leukocytes,Ua: NEGATIVE
Nitrite: NEGATIVE
Protein, ur: NEGATIVE mg/dL
Specific Gravity, Urine: 1.01 (ref 1.005–1.030)
pH: 6 (ref 5.0–8.0)

## 2021-06-25 MED ORDER — TIZANIDINE HCL 2 MG PO CAPS: 2.0000 mg | ORAL_CAPSULE | Freq: Three times a day (TID) | ORAL | 0 refills | Status: AC

## 2021-06-25 MED ORDER — IBUPROFEN 400 MG PO TABS: 400.0000 mg | ORAL_TABLET | Freq: Three times a day (TID) | ORAL | 0 refills | Status: AC

## 2021-06-29 ENCOUNTER — Ambulatory Visit
Admission: RE | Admit: 2021-06-29 | Discharge: 2021-06-29 | Disposition: A | Discharge status: Home / Self Care | Payer: Medicaid Other | Source: Ambulatory Visit | Attending: Physician Assistant | Admitting: Physician Assistant

## 2021-06-29 ENCOUNTER — Ambulatory Visit
Admission: RE | Admit: 2021-06-29 | Discharge: 2021-06-29 | Disposition: A | Discharge status: Home / Self Care | Payer: Self-pay | Source: Ambulatory Visit | Attending: Physician Assistant | Admitting: Physician Assistant

## 2021-06-29 DIAGNOSIS — N63 Unspecified lump in unspecified breast: Secondary | ICD-10-CM

## 2021-06-29 DIAGNOSIS — N644 Mastodynia: Secondary | ICD-10-CM

## 2021-07-01 ENCOUNTER — Encounter (HOSPITAL_COMMUNITY): Payer: Self-pay

## 2021-07-01 ENCOUNTER — Ambulatory Visit (HOSPITAL_COMMUNITY): Payer: Medicaid Other | Admitting: *Deleted

## 2021-07-01 ENCOUNTER — Other Ambulatory Visit: Payer: Self-pay

## 2021-07-01 VITALS — BP 118/82 | HR 91 | Ht 70.0 in | Wt 161.0 lb

## 2021-07-01 DIAGNOSIS — F251 Schizoaffective disorder, depressive type: Secondary | ICD-10-CM

## 2021-07-01 NOTE — Progress Notes (Signed)
In as scheduled for his monthly injection of Abilify Sustenna 156 mg given in his L DELTOID.  He is animated and upbeat, reporting he got a job at [employer] and will be working 5 days a week. Discussed taking care of himself, managing his sugar while at work by taking frequent breaks, taking snacks with him and eating out of his pocket if he has to. Talking to his manager and informing him of his situation and if the work is too much or days are to much and or too long to discuss with his boss reducing his hours. He was given much support for getting the job and sharing his enthusiasm for his new job. He denies any sx of his illness and offers no complaints. To return in one month and will be coming in around 430 pm to work with his new job.

## 2021-07-06 ENCOUNTER — Other Ambulatory Visit: Payer: Self-pay

## 2021-07-06 ENCOUNTER — Encounter (HOSPITAL_COMMUNITY): Payer: Self-pay | Admitting: Emergency Medicine

## 2021-07-06 ENCOUNTER — Emergency Department (HOSPITAL_COMMUNITY)
Admission: EM | Admit: 2021-07-06 | Discharge: 2021-07-06 | Disposition: A | Discharge status: Home / Self Care | Payer: Medicaid Other | Attending: Emergency Medicine | Admitting: Emergency Medicine

## 2021-07-06 DIAGNOSIS — E162 Hypoglycemia, unspecified: Secondary | ICD-10-CM | POA: Diagnosis present

## 2021-07-06 DIAGNOSIS — Z794 Long term (current) use of insulin: Secondary | ICD-10-CM | POA: Insufficient documentation

## 2021-07-06 DIAGNOSIS — E10649 Type 1 diabetes mellitus with hypoglycemia without coma: Secondary | ICD-10-CM | POA: Insufficient documentation

## 2021-07-06 LAB — BASIC METABOLIC PANEL
Anion gap: 11 (ref 5–15)
BUN: 14 mg/dL (ref 6–20)
CO2: 22 mmol/L (ref 22–32)
Calcium: 9.8 mg/dL (ref 8.9–10.3)
Chloride: 97 mmol/L — ABNORMAL LOW (ref 98–111)
Creatinine, Ser: 1.02 mg/dL (ref 0.61–1.24)
GFR, Estimated: >60 mL/min (ref >60)
Glucose, Bld: 580 mg/dL (ref 70–99)
Potassium: 4.1 mmol/L (ref 3.5–5.1)
Sodium: 130 mmol/L — ABNORMAL LOW (ref 135–145)

## 2021-07-06 LAB — CBG MONITORING, ED
Glucose-Capillary: 102 mg/dL — ABNORMAL HIGH (ref 70–99)
Glucose-Capillary: 41 mg/dL — CL (ref 70–99)
Glucose-Capillary: 215 mg/dL — ABNORMAL HIGH (ref 70–99)
Glucose-Capillary: 182 mg/dL — ABNORMAL HIGH (ref 70–99)

## 2021-07-06 NOTE — ED Notes (Signed)
Pt given sandwhich per [employer]

## 2021-07-06 NOTE — ED Notes (Signed)
Per Lab delay in BMP being resulted r/t to instrumentation issues. Should result soon.

## 2021-07-06 NOTE — ED Notes (Signed)
Patient verbalizes understanding of discharge instructions. Opportunity for questioning and answers were provided. Armband removed by staff, pt discharged from ED and ambulated to lobby to return home with family.  

## 2021-07-06 NOTE — ED Triage Notes (Signed)
Per EMS, pt was found unresponsive on the ground.  Apparently he had taken his insulin (28units) prior to leaving his house and did not eat anything.  Pt was found to have a CBG of 33, he was given D-10 (25g)  and his CBG went to 210.

## 2021-07-06 NOTE — Discharge Instructions (Addendum)
Make sure you are eating regularly to avoid hypoglycemia.

## 2021-07-06 NOTE — ED Provider Notes (Signed)
[facility] EMERGENCY DEPARTMENT Provider Note   CSN: [id number] Arrival date & time: 07/06/21  0257     History Chief Complaint  Patient presents with   Hypoglycemia    Jacob [name] is a 30 y.o. male.  Patient is a 30 year old male with a history of type 1 diabetes who is currently on sliding scale insulin presenting today after being found unresponsive and hypoglycemic.  Patient reports that it was a normal day today.  He had woken up and his blood sugar had been in the low 100s or 70s throughout the day.  He last took his insulin around 6:00 yesterday evening with dinner and was feeling fine but around 2 AM he noticed that he was starting to feel woozy and was concerned that his blood sugar may be dropping but he did not have any food available at that moment and started feeling worse and worse and paramedics reported they found him outside unresponsive.  When they checked his blood sugar his CBG was 33 and he was given D10 in route with significant improvement in mental status.  Here patient has no complaints.  He denies any recent illness such as cough, congestion, fever, abdominal pain, nausea, vomiting or diarrhea.  His medication has not changed recently but does report having issues with significant fluctuations in his blood sugar.   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 No pertinent surgical history.     No family history on file.  Social History   Tobacco Use   Smoking status: Never   Smokeless  tobacco: Never  Vaping Use   Vaping Use: Never used  Substance Use Topics   Alcohol use: No   Drug use: Not Currently    Home Medications Prior to Admission medications   Medication Sig Start Date End Date Taking? Authorizing Provider  acetaminophen (TYLENOL) 500 MG tablet Take 1,000 mg by mouth every 6 (six) hours as needed for moderate pain or headache.   Yes [provider]  hydrOXYzine (ATARAX/VISTARIL) 10 MG tablet Take 1 tablet (10 mg total) by mouth 3 (three) times daily as needed for anxiety. 01/27/21  Yes Toy Cookey E, NP  insulin aspart protamine - aspart (NOVOLOG 70/30 MIX) (70-30) 100 UNIT/ML FlexPen Inject 24-28 Units into the skin See admin instructions. Administer 28 units at breakfast and 24 units with dinner. 05/05/21  Yes [provider]  paliperidone (INVEGA SUSTENNA) 156 MG/ML SUSY injection Inject 1 mL (156 mg total) into the muscle every 28 (twenty-eight) days. Every 28 days (Due on 07-11-20): For mood control 04/23/21  Yes Toy Cookey E, NP  traZODone (DESYREL) 100 MG tablet Take 1 tablet (100 mg total) by mouth at bedtime as needed for sleep. 04/23/21  Yes Toy Cookey E, NP  Continuous Blood Gluc Receiver (DEXCOM G6 RECEIVER) DEVI 1 each by Continuous infusion (non-IV) route See admin instructions. Use as directed for continuous blood glucose monitoring. 11/18/20   [provider]  Continuous Blood Gluc Sensor (DEXCOM G6 SENSOR) MISC 1 each by Other  route See admin instructions. Every 10 days. 02/04/21   [provider]  Continuous Blood Gluc Transmit (DEXCOM G6 TRANSMITTER) MISC 1 each by Other route See admin instructions. Use as directed for continuous glucose monitoring. Use Transmitter for 90 days. Discard and replace. 02/16/21   [provider]    Allergies    Patient has no known allergies.  Review of Systems   Review of Systems  All other systems reviewed and are negative.  Physical Exam Updated Vital  Signs BP 106/63   Pulse 60   Temp (!) 96.4 F (35.8 C) (Oral)   Resp 11   Ht 5\' 9"  (1.753 m)   Wt 72.6 kg   SpO2 100%   BMI 23.63 kg/m   Physical Exam Vitals and nursing note reviewed.  Constitutional:      General: He is not in acute distress.    Appearance: He is well-developed.  HENT:     Head: Normocephalic and atraumatic.  Eyes:     Conjunctiva/sclera: Conjunctivae normal.     Pupils: Pupils are equal, round, and reactive to light.  Cardiovascular:     Rate and Rhythm: Normal rate and regular rhythm.     Heart sounds: No murmur heard. Pulmonary:     Effort: Pulmonary effort is normal. No respiratory distress.     Breath sounds: Normal breath sounds. No wheezing or rales.  Abdominal:     General: There is no distension.     Palpations: Abdomen is soft.     Tenderness: There is no abdominal tenderness. There is no guarding or rebound.  Musculoskeletal:        General: No tenderness. Normal range of motion.     Cervical back: Normal range of motion and neck supple.  Skin:    General: Skin is warm and dry.     Findings: No erythema or rash.  Neurological:     Mental Status: He is alert and oriented to person, place, and time. Mental status is at baseline.  Psychiatric:        Mood and Affect: Mood normal.        Behavior: Behavior normal.    ED Results / Procedures / Treatments   Labs (all labs ordered are listed, but only abnormal results are displayed) Labs Reviewed  BASIC METABOLIC PANEL - Abnormal; Notable for the following components:      Result Value   Sodium 130 (*)    Chloride 97 (*)    Glucose, Bld 580 (*)    All other components within normal limits  CBG MONITORING, ED - Abnormal; Notable for the following components:   Glucose-Capillary 102 (*)    All other components within normal limits  CBG MONITORING, ED - Abnormal; Notable for the following components:   Glucose-Capillary 41 (*)    All other components within normal limits  CBG  MONITORING, ED - Abnormal; Notable for the following components:   Glucose-Capillary 215 (*)    All other components within normal limits  CBG MONITORING, ED - Abnormal; Notable for the following components:   Glucose-Capillary 182 (*)    All other components within normal limits    EKG None  Radiology No results found.  Procedures Procedures   Medications Ordered in ED Medications - No data to display  ED Course  I have reviewed the triage vital signs and the nursing notes.  Pertinent labs & imaging results that were available during my care of the patient were reviewed by me and  considered in my medical decision making (see chart for details).    MDM Rules/Calculators/A&P                           Patient presenting today after being found hypoglycemic.  Significant improvement and rapid response with receiving D10.  Patient has been taking his insulin but sounds like he did not eat the way he should after having it at 6 PM.  He usually has a snack or something in the evenings and he did not after dinner.  Denies any recent illness has no signs to suggest DKA today.  Otherwise well-appearing.  Patient given something to eat.  Will monitor to ensure no further hypoglycemic events.  BMP pending to ensure no evidence of acute renal injury.  Pt checked here at 0340 and blood sugar of 41 although mentating normally and now eating.  Will watch closely. After eating BS has remained in the 100's.  Pt still has no complaints.  BMP with elevated sugar of 580 but that was after d10 and now 182.  Will d/c home.  Cr stable at 1.  MDM   Amount and/or Complexity of Data Reviewed Clinical lab tests: ordered and reviewed Independent visualization of images, tracings, or specimens: yes    Final Clinical Impression(s) / ED Diagnoses Final diagnoses:  Hypoglycemia    Rx / DC Orders ED Discharge Orders     None        Gwyneth Sprout, MD 07/06/21 [phone]

## 2021-07-06 NOTE — ED Notes (Signed)
MD Plunkett made aware CBG 41. PT given [employer]. RN to continue to monitor. GCS 15.

## 2021-07-20 ENCOUNTER — Ambulatory Visit: Payer: BLUE CROSS/BLUE SHIELD | Admitting: Podiatry

## 2021-07-27 ENCOUNTER — Other Ambulatory Visit: Payer: Self-pay

## 2021-07-27 ENCOUNTER — Telehealth (HOSPITAL_COMMUNITY): Payer: Medicaid Other | Admitting: Psychiatry

## 2021-07-28 ENCOUNTER — Ambulatory Visit (HOSPITAL_COMMUNITY): Payer: Medicaid Other

## 2021-07-29 ENCOUNTER — Ambulatory Visit (HOSPITAL_COMMUNITY): Payer: Medicaid Other

## 2021-07-30 ENCOUNTER — Ambulatory Visit (HOSPITAL_COMMUNITY): Payer: Medicaid Other | Admitting: *Deleted

## 2021-07-30 ENCOUNTER — Encounter (HOSPITAL_COMMUNITY): Payer: Self-pay | Admitting: Registered Nurse

## 2021-07-30 ENCOUNTER — Ambulatory Visit (INDEPENDENT_AMBULATORY_CARE_PROVIDER_SITE_OTHER): Payer: Medicaid Other | Admitting: Registered Nurse

## 2021-07-30 ENCOUNTER — Other Ambulatory Visit: Payer: Self-pay

## 2021-07-30 ENCOUNTER — Encounter (HOSPITAL_COMMUNITY): Payer: Self-pay

## 2021-07-30 VITALS — BP 129/75 | HR 96 | Ht 69.0 in | Wt 161.0 lb

## 2021-07-30 DIAGNOSIS — F251 Schizoaffective disorder, depressive type: Secondary | ICD-10-CM

## 2021-07-30 DIAGNOSIS — F411 Generalized anxiety disorder: Secondary | ICD-10-CM

## 2021-07-30 MED ORDER — PALIPERIDONE PALMITATE ER 546 MG/1.75ML IM SUSY
546.0000 mg | PREFILLED_SYRINGE | Freq: Once | INTRAMUSCULAR | Status: AC
  Administered 2021-10-29: 12:00:00 561.6 mg via INTRAMUSCULAR

## 2021-07-30 NOTE — Progress Notes (Signed)
In as planned for him to first see a provider today and then get his monthly injection of Invega S 156 mg. Today he got his shot in his R DELTOID without difficulty.Discussed with Shuvon NP changing to the every three month injection as he has been stable and consistent. He is happy for the change and in agreement with it. His new rx will be called in to the pharmacy for him to bring next month as he has medicaid. He is now working at [employer] and he likes it. His current hours are 4 am - 9 am 5 days a week. Encouraged to keep himself at part time to help ensure his mental well being. I asked him about his glucose levels lately and said he lost his glucose sensor for his sugar, he knows he needs to follow up with his Dr. He offers no complaints. He is scheduled back in one month. Met his mom today who brought him to his appt.

## 2021-07-30 NOTE — Progress Notes (Signed)
BH MD/PA/NP OP Progress Note  07/30/2021 2:43 PM Jacob [name]  MRN:  [id number]  Chief Complaint:  HPI: "I've been doing good.  I've been trying to work some over time at work."  [employer], 30 y.o., male seen today for follow up and administration of long acting injectable [name]) and psychiatric evaluation.  [name] has a psychiatric history of schizoaffective disorder depressive type, Major depressive disorder with psychotic features, and suicidal ideation.  He is currently being managed on Invega Sustenna 156 mg monthly.  Patient reporting medication has been working well and no adverse reaction.  Discussed switching to [employer] 546 every 3 month.  Patient in agreement stating that it would be better because it is difficult to get here monthly for injection and doesn't want to end up missing a dose.  Feels it would make it a lot easier coming in every 3 months instead of monthly.  Patient education on how the transfer from monthly would go.  Informed he would come in next month for first Trinza injection but would like to follow up in one month to make sure everything is going well and no adverse reactions.  Patient in agreement.   only at this time.  He has been doing well on monthly injection discussed going to 3 month [place].    Today [name] is dressed appropriated for weather and well groomed.  He reports there has been no depression and minimal anxiety.  Reports when he is stressed he usually goes on a walk to help calm down.  He reports that he feels his mental health is stable and that he has been doing well.  GAD-7, PHQ-9, CSRRS, AIMS conducted by provider see scores below.  He denies auditory/visual hallucinations and paranoia stating that the [place] continues to help with his mood.  Today [name] denies suicidal/self-harm/homicidal ideation, psychosis, and paranoia.  He reports he has been sleeping and eating with out any difficulty.    Tarren  Lince reports that he continues to smoke marijuana occasional still about once a month.  Reports that he hasn't suffered from any adverse reaction such as hallucinations from the marijuana.  Some paranoia but not bad.  Discussed the importance of avoiding marijuana.  Understanding voiced.         Visit Diagnosis:    ICD-10-CM   1. Schizoaffective disorder, depressive type (HCC)  F25.1 Paliperidone Hinda Glatter Sustenna) 156 Mg/mL Q 25 days.  Last injection given 07/30/2021      Past Psychiatric History: Schizoaffective disorder depressive type, MDD with psychotic features and SI  Past Medical History:  Past Medical History:  Diagnosis Date   Diabetes mellitus without complication (HCC)    History reviewed. No pertinent surgical history.  Family Psychiatric History: None on file  Family History: History reviewed. No pertinent family history.  Social History:  Social History   Socioeconomic History   Marital status: Single    Spouse name: Not on file   Number of children: Not on file   Years of education: Not on file   Highest education level: Not on file  Occupational History   Not on file  Tobacco Use   Smoking status: Never   Smokeless tobacco: Never  Vaping Use   Vaping Use: Never used  Substance and Sexual Activity   Alcohol use: No   Drug use: Not Currently   Sexual activity: Not on file  Other Topics Concern   Not on file  Social History Narrative  Not on file   Social Determinants of Health   Financial Resource Strain: Not on file  Food Insecurity: Not on file  Transportation Needs: Not on file  Physical Activity: Not on file  Stress: Not on file  Social Connections: Not on file    Allergies: No Known Allergies  Metabolic Disorder Labs: Lab Results  Component Value Date   HGBA1C >15.5 (H) 07/28/2020   MPG >398 07/28/2020   MPG 294.83 06/08/2020   No results found for: PROLACTIN Lab Results  Component Value Date   CHOL 178 11/16/2019   TRIG 41  11/16/2019   HDL 79 11/16/2019   CHOLHDL 2.3 11/16/2019   VLDL 8 11/16/2019   LDLCALC 91 11/16/2019   LDLCALC 114 (H) 02/06/2018   Lab Results  Component Value Date   TSH 1.568 11/16/2019   TSH 1.277 02/06/2018    Therapeutic Level Labs: No results found for: LITHIUM No results found for: VALPROATE No components found for:  CBMZ  Current Medications: Current Outpatient Medications  Medication Sig Dispense Refill   acetaminophen (TYLENOL) 500 MG tablet Take 1,000 mg by mouth every 6 (six) hours as needed for moderate pain or headache.     Continuous Blood Gluc Receiver (DEXCOM G6 RECEIVER) DEVI 1 each by Continuous infusion (non-IV) route See admin instructions. Use as directed for continuous blood glucose monitoring.     Continuous Blood Gluc Sensor (DEXCOM G6 SENSOR) MISC 1 each by Other route See admin instructions. Every 10 days.     Continuous Blood Gluc Transmit (DEXCOM G6 TRANSMITTER) MISC 1 each by Other route See admin instructions. Use as directed for continuous glucose monitoring. Use Transmitter for 90 days. Discard and replace.     hydrOXYzine (ATARAX/VISTARIL) 10 MG tablet Take 1 tablet (10 mg total) by mouth 3 (three) times daily as needed for anxiety. 90 tablet 2   insulin aspart protamine - aspart (NOVOLOG 70/30 MIX) (70-30) 100 UNIT/ML FlexPen Inject 24-28 Units into the skin See admin instructions. Administer 28 units at breakfast and 24 units with dinner.     paliperidone (INVEGA SUSTENNA) 156 MG/ML SUSY injection Inject 1 mL (156 mg total) into the muscle every 28 (twenty-eight) days. Every 28 days (Due on 07-11-20): For mood control 1 mL 11   traZODone (DESYREL) 100 MG tablet Take 1 tablet (100 mg total) by mouth at bedtime as needed for sleep. 30 tablet 2   Current Facility-Administered Medications  Medication Dose Route Frequency Provider Last Rate Last Admin   paliperidone (INVEGA SUSTENNA) injection 156 mg  156 mg Intramuscular Q28 days Toy Cookey E, NP    156 mg at 07/30/21 1321     Musculoskeletal: Strength & Muscle Tone: within normal limits Gait & Station: normal Patient leans: N/A  Psychiatric Specialty Exam: Review of Systems  Constitutional: Negative.   HENT: Negative.    Eyes: Negative.   Respiratory: Negative.    Cardiovascular: Negative.   Gastrointestinal: Negative.   Genitourinary: Negative.   Musculoskeletal: Negative.   Skin: Negative.   Neurological: Negative.   Hematological: Negative.   Psychiatric/Behavioral:  Negative for agitation, behavioral problems, decreased concentration, dysphoric mood, hallucinations and suicidal ideas. The patient is not nervous/anxious and is not hyperactive.    There were no vitals taken for this visit.There is no height or weight on file to calculate BMI.  General Appearance: Casual and Neat  Eye Contact:  Good  Speech:  Clear and Coherent and Normal Rate  Volume:  Normal  Mood:  Euthymic  Affect:  Appropriate and Congruent  Thought Process:  Coherent, Goal Directed, and Descriptions of Associations: Intact  Orientation:  Full (Time, Place, and Person)  Thought Content: WDL and Logical   Suicidal Thoughts:  No  Homicidal Thoughts:  No  Memory:  Immediate;   Good Recent;   Good Remote;   Good  Judgement:  Intact  Insight:  Good and Present  Psychomotor Activity:  Normal  Concentration:  Concentration: Good and Attention Span: Good  Recall:  Good  Fund of Knowledge: Good  Language: Good  Akathisia:  No  Handed:  Left  AIMS (if indicated): done  Assets:  Communication Skills Desire for Improvement Financial Resources/Insurance Housing Social Support  ADL's:  Intact  Cognition: WNL  Sleep:  Good   Screenings: AIMS    Flowsheet Row Office Visit from 07/30/2021 in [facility]) [facility]) [facility]  AIMS Total Score 0 0 0 0 0      AUDIT    Flowsheet Row [facility]) [facility] Ad[facility] (Discharged) from OP [facility] (Dis[facility]  Alcohol Use Disorder Identification Test Final Score (AUDIT) 0 2 0      GAD-7    Flowsheet Row Clinical Support from 04/23/2021 in [facility] Video Visit from 01/27/2021 in [facility] Counselor from 01/07/2021 in [facility] Clinical Support from 10/15/2020 in [facility]  Total GAD-7 Score 6 8 15 12       PHQ2-9    Flowsheet Row Office Visit from 07/30/2021 in [facility] Clinical Support from 04/23/2021 in [facility] Video Visit from 01/27/2021 in [facility] Counselor from 01/07/2021 in [facility] Clinical Support from 10/15/2020 in [facility]  PHQ-2 Total Score 0 0 2 4 2   PHQ-9 Total Score -- 0 9 16 14       Flowsheet Row Office Visit from 07/30/2021 in [facility] from 07/06/2021 in [facility] ED from 06/24/2021 in [facility]  C-SSRS RISK CATEGORY No Risk No Risk No Risk      Assessment and Plan: Jay Nevares reports has been doing well on the 06/26/2021 with no adverse reactions.  Patient voicing that it is difficult to come in monthly for injection since working 5 days a week; will switch to the [place] on next visit.  Patient aware that he will still follow up in one month after the first injection of the Claudine Mouton.   Schizoaffective disorder, depressive type (HCC) Invega Sustenna 156 mg given today.  Will switch to [place] 546 mg Q 3 months on next visit in one month Continue- traZODone (DESYREL) 100 MG tablet; Take 1 tablet (100 mg total) by mouth at bedtime as needed for sleep. (Reporting he has not had to take).  No refill needed Ordered Invega Trinza 546 mg Q 3 months to be given at nest visit  Generalized anxiety disorder  Continue-  hydrOXYzine (ATARAX/VISTARIL) 10 MG tablet; Take 1 tablet (10 mg total) by mouth 3 (three) times daily as needed for anxiety (reports he has not had to take).  No refills needed.   Follow up in one month  Harryette Shuart, NP 07/30/2021, 2:43 PM

## 2021-08-27 ENCOUNTER — Ambulatory Visit (HOSPITAL_COMMUNITY): Payer: Medicaid Other

## 2021-08-27 ENCOUNTER — Encounter (HOSPITAL_COMMUNITY): Payer: Self-pay

## 2021-08-27 ENCOUNTER — Ambulatory Visit (HOSPITAL_COMMUNITY): Payer: Medicaid Other | Admitting: *Deleted

## 2021-08-27 ENCOUNTER — Other Ambulatory Visit: Payer: Self-pay

## 2021-08-27 VITALS — BP 124/83 | HR 91 | Ht 69.0 in | Wt 160.0 lb

## 2021-08-27 DIAGNOSIS — F251 Schizoaffective disorder, depressive type: Secondary | ICD-10-CM

## 2021-08-27 NOTE — Progress Notes (Signed)
In late today for his scheduled shot. He states he was off the past two days and forgot about coming in today. He continues to work at [employer] a few hours in the am 5 days a week and its going well. He denies any sx of psychosis and is not dangerous to self or others. He is at baseline. Today he got his Invega S 156 mg in his L DELTOID without difficulty. Discussed with him today changing his shot to an every three month shot. He is in favor of this change. Education re this shot discussed. Will discuss the change with the provider. He is to return in one month for his next injection and will plan for it to be a [place] shot at that time.

## 2021-09-11 ENCOUNTER — Encounter: Payer: Medicaid Other | Attending: Internal Medicine | Admitting: Dietician

## 2021-09-11 ENCOUNTER — Other Ambulatory Visit: Payer: Self-pay

## 2021-09-11 ENCOUNTER — Encounter: Payer: Self-pay | Admitting: Dietician

## 2021-09-11 DIAGNOSIS — E109 Type 1 diabetes mellitus without complications: Secondary | ICD-10-CM | POA: Insufficient documentation

## 2021-09-11 NOTE — Progress Notes (Signed)
Diabetes Self-Management Education  Visit Type: First/Initial  Appt. Start Time: 1325 Appt. 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 male with a diagnosis of Diabetes: Type 2.   ASSESSMENT Patient is here today with his grandmother. Patient had hypoglycemia at time of visit with blood glucose of 40.  8 oz of juice provided and blood glucose increased to 98.  Snack provided with protein.  Glucose 99 at end of the appontment.  Patient previously used the Dexcom G6.  Grandmother stated that patient lost consciousness and lost the transmitter in the emergent event.   Patient was able to put the [facility] app on his phone and sample applied to left abdomen.  Transmitter did not pair.  New sensor and transmitter applied to upper left arm, paired, and started.  This is in warm up and will be active in 2 hours. Recommended that patient put the Dexcom Follow app on his phone and the phone of someone he trusts.  Grandmother's phone is not compatible with the Dexcom follow app. Recommend that patient share that he has type 1 diabetes with his boss.  Phones are not allowed while he is working which means no access to his [employer].  Discussed to have him speak to his boss about this so that he can have access to his glucose readings. Reviewed treatment of low blood glucose. Reviewed taking the insulin at consistent times before meals. Reviewed how to mix the insulin as he is currently shaking the insulin pen to mix. Review nutrition at follow up visit along with glucose control.  History includes Type 1 diabetes (2020), schizophrenia, smoking, mixed hyperlipidemia Medications include Novolog 70/30 - 30 units twice per day (6 pm and 3 am).  He never started the atorvastatin.  He stopped the gabapentin.  Labs noted to include:  A1C 9.5% 07/08/2021 decreased from 9.5% 02/19/2021,  Cholesterol 174, HDL 67, LDL 91, Triglycerides 70 07/08/2021 improved  from Cholesterol 237, HDL 67, LDL -unable to calculate, and Triglycerides of 446 02/19/2021  Patient lives with his grandmother.  She does most of the shopping and cooking. He works at [employer] from 11 pm - 7 am.  He now works full time and will be eligible for insurance soon.  He is currently on Medicaid per patient.  Height 5\' 10"  (1.778 m), weight 154 lb (69.9 kg). Body mass index is 22.1 kg/m.   Diabetes Self-Management Education - 09/11/21 1410       Visit Information   Visit Type First/Initial      Initial Visit   Diabetes Type Type 2    Are you currently following a meal plan? No    Are you taking your medications as prescribed? No    Date Diagnosed 2020      Health Coping   How would you rate your overall health? Good      Psychosocial Assessment   Patient Belief/Attitude about Diabetes Motivated to manage diabetes    Self-care barriers Other (comment)   schizophrenia   Self-management support Doctor's office    Other persons present Patient;Family Member   grandmother   Patient Concerns Nutrition/Meal planning;Glycemic Control    Special Needs None    Preferred Learning Style No preference indicated    Learning Readiness Ready    How often do you need to have someone help you when you read instructions, pamphlets, or other written materials from your doctor or  pharmacy? 3 - Sometimes    What is the last grade level you completed in school? 12      Pre-Education Assessment   Patient understands the diabetes disease and treatment process. Needs Review    Patient understands incorporating nutritional management into lifestyle. Needs Review    Patient undertands incorporating physical activity into lifestyle. Needs Review    Patient understands using medications safely. Needs Review    Patient understands monitoring blood glucose, interpreting and using results Needs Review    Patient understands prevention, detection, and treatment of acute complications.  Needs Review    Patient understands prevention, detection, and treatment of chronic complications. Needs Review    Patient understands how to develop strategies to address psychosocial issues. Needs Review    Patient understands how to develop strategies to promote health/change behavior. Needs Review      Complications   Last HgB A1C per patient/outside source 9.5 %   07/08/2021     Patient Education   Previous Diabetes Education Yes (please comment)   2021   Physical activity and exercise  Other (comment)   reviewed that exercise will cause glucose to decrease   Medications Reviewed patients medication for diabetes, action, purpose, timing of dose and side effects.    Monitoring Other (comment)   Started Dexcom and reviewed trend arrows.   Acute complications Taught treatment of hypoglycemia - the 15 rule.    Psychosocial adjustment Worked with patient to identify barriers to care and solutions      Individualized Goals (developed by patient)   Medications take my medication as prescribed    Monitoring  test my blood glucose as discussed    Reducing Risk examine blood glucose patterns;treat hypoglycemia with 15 grams of carbs if blood glucose less than 70mg /dL;stop smoking      Post-Education Assessment   Patient understands the diabetes disease and treatment process. Needs Review    Patient understands incorporating nutritional management into lifestyle. Needs Review    Patient undertands incorporating physical activity into lifestyle. Needs Review    Patient understands using medications safely. Needs Review    Patient understands monitoring blood glucose, interpreting and using results Needs Review    Patient understands prevention, detection, and treatment of acute complications. Needs Review    Patient understands prevention, detection, and treatment of chronic complications. Needs Review    Patient understands how to develop strategies to address psychosocial issues. Needs Review     Patient understands how to develop strategies to promote health/change behavior. Needs Review      Outcomes   Expected Outcomes Demonstrated interest in learning. Expect positive outcomes    Future DMSE 2 months    Program Status Not Completed             Individualized Plan for Diabetes Self-Management Training:   Learning Objective:  Patient will have a greater understanding of diabetes self-management. Patient education plan is to attend individual and/or group sessions per assessed needs and concerns.   Plan:   Patient Instructions  Consider putting the Dexcom Follow app on your phone and your mom's phone.  Expected Outcomes:  Demonstrated interest in learning. Expect positive outcomes  Education material provided:   If problems or questions, patient to contact team via:  Phone  Future DSME appointment: 2 months

## 2021-09-11 NOTE — Patient Instructions (Signed)
Consider putting the Dexcom Follow app on your phone and your mom's phone.

## 2021-09-23 ENCOUNTER — Ambulatory Visit (HOSPITAL_COMMUNITY): Payer: Medicaid Other

## 2021-09-28 ENCOUNTER — Other Ambulatory Visit: Payer: Self-pay

## 2021-09-28 ENCOUNTER — Encounter (HOSPITAL_COMMUNITY): Payer: Self-pay

## 2021-09-28 ENCOUNTER — Ambulatory Visit (INDEPENDENT_AMBULATORY_CARE_PROVIDER_SITE_OTHER): Payer: Medicaid Other | Admitting: *Deleted

## 2021-09-28 VITALS — BP 120/76 | HR 86 | Ht 70.0 in | Wt 157.0 lb

## 2021-09-28 DIAGNOSIS — F251 Schizoaffective disorder, depressive type: Secondary | ICD-10-CM

## 2021-09-28 NOTE — Progress Notes (Signed)
Patient arrived for his paliperidone (INVEGA SUSTENNA) injection 156 mg. Which is late due to holiday & medication wasn't called in by family/patient to arrive early. Patient enjoyed his holiday & is Pleasant as always.

## 2021-09-29 ENCOUNTER — Ambulatory Visit (HOSPITAL_COMMUNITY): Payer: Medicaid Other

## 2021-10-26 ENCOUNTER — Encounter (HOSPITAL_COMMUNITY): Payer: Self-pay

## 2021-10-26 ENCOUNTER — Emergency Department (HOSPITAL_COMMUNITY)
Admission: EM | Admit: 2021-10-26 | Discharge: 2021-10-26 | Disposition: A | Discharge status: Home / Self Care | Payer: Medicaid Other | Attending: Student | Admitting: Student

## 2021-10-26 ENCOUNTER — Emergency Department (HOSPITAL_COMMUNITY): Payer: Medicaid Other

## 2021-10-26 ENCOUNTER — Other Ambulatory Visit: Payer: Self-pay

## 2021-10-26 DIAGNOSIS — J101 Influenza due to other identified influenza virus with other respiratory manifestations: Secondary | ICD-10-CM | POA: Insufficient documentation

## 2021-10-26 DIAGNOSIS — Z20822 Contact with and (suspected) exposure to covid-19: Secondary | ICD-10-CM | POA: Insufficient documentation

## 2021-10-26 DIAGNOSIS — R0981 Nasal congestion: Secondary | ICD-10-CM | POA: Diagnosis present

## 2021-10-26 LAB — RESP PANEL BY RT-PCR (FLU A&B, COVID) ARPGX2
Influenza A by PCR: POSITIVE — AB
Influenza B by PCR: NEGATIVE
SARS Coronavirus 2 by RT PCR: NEGATIVE

## 2021-10-26 NOTE — ED Provider Notes (Signed)
Emergency Medicine Provider Triage Evaluation Note  Jacob [name] , a 30 y.o. male  was evaluated in triage.  Pt complains of cough, rhinorrhea and nasal congestion.  Symptoms present for 3 days.  No known sick contacts.  Review of Systems  Positive: Cough, rhinorrhea, nasal congestion Negative: Fever, chills, nausea, vomiting, diarrhea, abdominal  Physical Exam  BP 114/85 (BP Location: Left Arm)    Pulse (!) 117    Temp 100.2 F (37.9 C) (Oral)    Resp 16    Ht 5\' 10"  (1.778 m)    Wt 72.6 kg    SpO2 95%    BMI 22.96 kg/m  Gen:   Awake, no distress   Resp:  Normal effort , lungs clear to auscultation bilaterally MSK:   Moves extremities without difficulty  Other:    Medical Decision Making  Medically screening exam initiated at 1:56 PM.  Appropriate orders placed.  Jacob [name] was informed that the remainder of the evaluation will be completed by another provider, this initial triage assessment does not replace that evaluation, and the importance of remaining in the ED until their evaluation is complete.     Claudine Mouton, [name] 10/26/21 1357    10/28/21, DO 10/27/21 1642

## 2021-10-26 NOTE — Discharge Instructions (Addendum)
You were seen in the emergency department today for nasal congestion and cough.  As we discussed your influenza A test is positive.  This is a viral illness very common at this time of year, and we normally treat with over-the-counter medications.  Symptoms can last for up to a week.  You can take ibuprofen or Tylenol for pain or fever, and I recommend alternating between the 2.  Make sure that you are drinking lots of fluids and getting plenty of rest.  Continue to monitor how you are doing, and return to the emergency department for new or worsening symptoms such as chest pain, difficulty breathing not related to coughing, fever despite medication, or persistent vomiting or diarrhea.  It was a pleasure taking care of you today and I hope you begin to feel better soon!

## 2021-10-26 NOTE — ED Triage Notes (Signed)
Patient c/o nasal congestion and a cough x 3 days. Patient denies fever, N/v/d.

## 2021-10-27 ENCOUNTER — Ambulatory Visit (HOSPITAL_COMMUNITY): Payer: Medicaid Other

## 2021-10-29 ENCOUNTER — Encounter (HOSPITAL_COMMUNITY): Payer: Self-pay

## 2021-10-29 ENCOUNTER — Other Ambulatory Visit: Payer: Self-pay

## 2021-10-29 ENCOUNTER — Ambulatory Visit (HOSPITAL_COMMUNITY): Payer: Medicaid Other | Admitting: *Deleted

## 2021-10-29 VITALS — BP 120/66 | HR 112 | Ht 70.0 in | Wt 152.0 lb

## 2021-10-29 DIAGNOSIS — F251 Schizoaffective disorder, depressive type: Secondary | ICD-10-CM

## 2021-10-29 MED ORDER — PALIPERIDONE PALMITATE ER 546 MG/1.75ML IM SUSY: 546.0000 mg | PREFILLED_SYRINGE | INTRAMUSCULAR | Status: DC

## 2021-10-29 NOTE — Progress Notes (Signed)
In late today for his shot, thought his appt was at 12 instead of 11. He has not been back to work since getting the flu and not committed to a return date but says he will go back. Smells strongly of marijuana today, he offered his "home boy" brought him in today rather than his grandma. He got his first shot of [place] today as discussed with provider at last visit. He is to come back in one month for a check in and also to see the provider for a three month assessment since starting new dose of med. He got his shot today in his L DELTOID without issue. Education provided on new shot. To return for next shot in 3 months and one month for a check in.

## 2021-11-02 NOTE — ED Provider Notes (Signed)
[facility] DEPT Provider Note   CSN: [id number] Arrival date & time: 10/26/21  1155     History  Chief Complaint  Patient presents with   Nasal Congestion   Cough   Fever    Jacob [name] is a 31 y.o. male with no significant past medical history presents the emergency department for flulike symptoms for 3 days.  He is complaining of nasal congestion and cough.  He denies fever, nausea, or vomiting.  He has tried NyQuil for his symptoms, with minimal relief.  He is tolerating p.o.  No chest pain or shortness of breath.  Cough Associated symptoms: chills, headaches and myalgias   Associated symptoms: no ear pain, no fever and no sore throat   Fever Associated symptoms: chills, congestion, cough, headaches and myalgias   Associated symptoms: no diarrhea, no dysuria, no ear pain, no nausea, no sore throat and no vomiting       Home Medications Prior to Admission medications   Medication Sig Start Date End Date Taking? Authorizing Provider  acetaminophen (TYLENOL) 500 MG tablet Take 1,000 mg by mouth every 6 (six) hours as needed for moderate pain or headache.    [provider]  Continuous Blood Gluc Receiver ([place]) [place] 1 each by Continuous infusion (non-IV) route See admin instructions. Use as directed for continuous blood glucose monitoring. 11/18/20   [provider]  Continuous Blood Gluc Sensor (DEXCOM G6 SENSOR) MISC 1 each by Other route See admin instructions. Every 10 days. 02/04/21   [provider]  Continuous Blood Gluc Transmit (DEXCOM G6 TRANSMITTER) MISC 1 each by Other route See admin instructions. Use as directed for continuous glucose monitoring. Use Transmitter for 90 days. Discard and replace. 02/16/21   [provider]  hydrOXYzine (ATARAX/VISTARIL) 10 MG tablet Take 1 tablet (10 mg total) by mouth 3 (three) times daily as needed for anxiety. 01/27/21   Salley Slaughter, NP  insulin  aspart protamine - aspart (NOVOLOG 70/30 MIX) (70-30) 100 UNIT/ML FlexPen Inject 24-28 Units into the skin See admin instructions. Administer 28 units at breakfast and 24 units with dinner. 05/05/21   [provider]  paliperidone (INVEGA SUSTENNA) 156 MG/ML SUSY injection Inject 1 mL (156 mg total) into the muscle every 28 (twenty-eight) days. Every 28 days (Due on 07-11-20): For mood control 04/23/21   Salley Slaughter, NP  traZODone (DESYREL) 100 MG tablet Take 1 tablet (100 mg total) by mouth at bedtime as needed for sleep. 04/23/21   Salley Slaughter, NP      Allergies    Patient has no known allergies.    Review of Systems   Review of Systems  Constitutional:  Positive for chills. Negative for appetite change and fever.  HENT:  Positive for congestion. Negative for ear pain, sore throat and trouble swallowing.   Respiratory:  Positive for cough.   Gastrointestinal:  Negative for abdominal pain, constipation, diarrhea, nausea and vomiting.  Genitourinary:  Negative for dysuria and frequency.  Musculoskeletal:  Positive for myalgias.  Neurological:  Positive for headaches.  All other systems reviewed and are negative.  Physical Exam Updated Vital Signs BP 120/80 (BP Location: Right Arm)    Pulse 94    Temp 100.2 F (37.9 C) (Oral)    Resp 20    Ht 5\' 10"  (1.778 m)    Wt 72.6 kg    SpO2 99%    BMI 22.96 kg/m  Physical Exam Vitals and nursing note  reviewed.  Constitutional:      Appearance: Normal appearance.  HENT:     Head: Normocephalic and atraumatic.     Mouth/Throat:     Mouth: Mucous membranes are moist.     Pharynx: Oropharynx is clear. No oropharyngeal exudate or posterior oropharyngeal erythema.  Eyes:     Conjunctiva/sclera: Conjunctivae normal.  Cardiovascular:     Rate and Rhythm: Normal rate and regular rhythm.  Pulmonary:     Effort: Pulmonary effort is normal. No respiratory distress.     Breath sounds: Normal breath sounds.  Abdominal:      General: There is no distension.     Palpations: Abdomen is soft.     Tenderness: There is no abdominal tenderness.  Skin:    General: Skin is warm and dry.  Neurological:     General: No focal deficit present.     Mental Status: He is alert.    ED Results / Procedures / Treatments   Labs (all labs ordered are listed, but only abnormal results are displayed) Labs Reviewed  RESP PANEL BY RT-PCR (FLU A&B, COVID) ARPGX2 - Abnormal; Notable for the following components:      Result Value   Influenza A by PCR POSITIVE (*)    All other components within normal limits    EKG None  Radiology No results found.  Procedures Procedures    Medications Ordered in ED Medications - No data to display  ED Course/ Medical Decision Making/ A&P                           Medical Decision Making Patient is an otherwise healthy 31 year old male who presents to the emergency department for flulike symptoms for the past 3 days.  He endorses nasal congestion, cough, and chills.  He denies fever, chest pain, shortness of breath, nausea, vomiting, and diarrhea.  My exam patient is afebrile, not hypoxic, no acute distress.  His lung sounds are clear to auscultation all fields.  Oropharynx with no erythema, exudate, or tonsillar swelling.  Abdomen is soft, nontender nondistended.  Overall patient is clinically well-appearing.  Patient has a positive for influenza A.  Negative for COVID-19.  He is clinically well-appearing, and I do not believe he is requiring admission or inpatient treatment for his symptoms at this time.  He has no comorbidities that would impact treatment of this.  I have low suspicion for pneumonia, or other acute etiologies for his symptoms.  We will treat with symptomatic management, and give close return precautions.  Patient is agreeable to plan.  Final Clinical Impression(s) / ED Diagnoses Final diagnoses:  Influenza A    Rx / DC Orders ED Discharge Orders     None       Portions of this report may have been transcribed using voice recognition software. Every effort was made to ensure accuracy; however, inadvertent computerized transcription errors may be present.    Estill Cotta 11/02/21 1640    Teressa Lower, MD 11/02/21 [phone]

## 2021-11-05 ENCOUNTER — Ambulatory Visit: Payer: Medicaid Other | Admitting: Dietician

## 2021-11-09 ENCOUNTER — Encounter: Payer: Medicaid Other | Attending: Internal Medicine | Admitting: Dietician

## 2021-11-09 ENCOUNTER — Other Ambulatory Visit: Payer: Self-pay

## 2021-11-09 DIAGNOSIS — E109 Type 1 diabetes mellitus without complications: Secondary | ICD-10-CM | POA: Diagnosis present

## 2021-11-09 NOTE — Patient Instructions (Signed)
Avoid skipping meals Breakfast, Lunch, and Dinner daily 1/2 your plate should be vegetables Protein with every meal (meat, beans, nuts, peanut butter, egg, cheese) Carbohydrate (bread, rice, potatoes, corn, peas, beans, sweets, fruit, milk)  Treat low blood sugar with 1/2 cup juice or regular soda, wait 15 minutes, recheck sugar and when above 70 eat a snack with protein and carbohydrates.

## 2021-11-09 NOTE — Progress Notes (Signed)
Diabetes Self-Management Education  Visit Type: Follow-up  Appt. Start Time: 1640 Appt. 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 male with a diagnosis of Diabetes: Type 1.   ASSESSMENT Patient is here today with his grandmother.  He was last seen by myself on 09/11/2021.  Patient is using the Dexcom Sensor more consistently but could not see more than 1 day of data today.  Patient glucose within range with daily low glucose as well.   Glucose again low at visit (60 on Dexcom and 56 on blood glucose meter).  Provided 6 oz juice.  Glucose increased to 99 15 minutes later.  Provided peanut butter and crackers. Patient frequently skips meals on 70/30 insulin.  Discussed the importance of consistent meals. He is not motivated to prepare his own meals. He continues to work nights and some days at [employer]. He states that he drank some hard liquor this weekend (maybe 3 servings).  Reviewed the risks of alcohol with diabetes and medications.  History includes Type 1 diabetes (2020), schizophrenia, smoking, mixed hyperlipidemia Medications include Novolog 70/30 - 30 units twice per day (6 pm and 3 am).  He never started the atorvastatin.  He stopped the gabapentin.  Labs noted to include:  A1C 9.5% 07/08/2021 decreased from 9.5% 02/19/2021, >15.5% 07/2020 Cholesterol 174, HDL 67, LDL 91, Triglycerides 70 07/08/2021 improved from Cholesterol 237, HDL 67, LDL -unable to calculate, and Triglycerides of 446 02/19/2021   Patient lives with his grandmother.  She does most of the shopping and cooking. He works at [employer] from 11 pm - 7 am.  He now works full time and will be eligible for insurance soon.  He is currently on Medicaid per patient.    Diabetes Self-Management Education - 11/10/21 1543       Visit Information   Visit Type Follow-up      Initial Visit   Diabetes Type Type 1    Are you taking your  medications as prescribed? Yes      Psychosocial Assessment   Self-care barriers Other (comment)   mental health   Self-management support Doctor's office;Family    Other persons present Patient;Family Member    Patient Concerns Nutrition/Meal planning;Glycemic Control;Problem Solving    Special Needs Instruct caregiver;Simplified materials    Preferred Learning Style No preference indicated      Pre-Education Assessment   Patient understands the diabetes disease and treatment process. Needs Review    Patient understands incorporating nutritional management into lifestyle. Needs Review    Patient undertands incorporating physical activity into lifestyle. Needs Review    Patient understands using medications safely. Needs Review    Patient understands monitoring blood glucose, interpreting and using results Needs Review    Patient understands prevention, detection, and treatment of acute complications. Needs Review    Patient understands prevention, detection, and treatment of chronic complications. Needs Review    Patient understands how to develop strategies to address psychosocial issues. Needs Review    Patient understands how to develop strategies to promote health/change behavior. Needs Review      Complications   How often do you check your blood sugar? > 4 times/day    Fasting Blood glucose range (mg/dL) 89-211    Postprandial Blood glucose range (mg/dL) 94-174;081-448    Number of hypoglycemic episodes per month 12    Can you tell when your blood sugar is low?  No   discussed importance of keeping the dexcom on with alarms also turned on     Dietary Intake   Breakfast bacon, liver pudding, cheesy grits   8 am   Lunch veggie muffin    Dinner chicken and noodles, green beans, beets    Beverage(s) water, sugar free gatorade/powerade, crystal lite, chocolate milk      Exercise   Exercise Type Light (walking / raking leaves)   work related     Patient Education   Previous  Diabetes Education Yes (please comment)   08/2021   Nutrition management  Meal options for control of blood glucose level and chronic complications.;Role of diet in the treatment of diabetes and the relationship between the three main macronutrients and blood glucose level;Effects of alcohol on blood glucose and safety factors with consumption of alcohol.    Medications Reviewed patients medication for diabetes, action, purpose, timing of dose and side effects.    Monitoring Taught/evaluated SMBG meter.    Acute complications Taught treatment of hypoglycemia - the 15 rule.      Individualized Goals (developed by patient)   Nutrition General guidelines for healthy choices and portions discussed    Physical Activity Exercise 5-7 days per week;30 minutes per day    Medications take my medication as prescribed    Monitoring  test my blood glucose as discussed    Reducing Risk treat hypoglycemia with 15 grams of carbs if blood glucose less than 70mg /dL;Other (comment)   avoid skipping meals   Health Coping discuss diabetes with (comment)   MD, RD, CDCES, grandmother     Patient Self-Evaluation of Goals - Patient rates self as meeting previously set goals (% of time)   Nutrition 50 - 75 %    Physical Activity 25 - 50%    Medications >75%    Monitoring >75%    Problem Solving 50 - 75 %    Reducing Risk 25 - 50%    Health Coping 50 - 75 %      Post-Education Assessment   Patient understands the diabetes disease and treatment process. Needs Review    Patient understands incorporating nutritional management into lifestyle. Needs Review    Patient undertands incorporating physical activity into lifestyle. Needs Review    Patient understands using medications safely. Needs Review    Patient understands monitoring blood glucose, interpreting and using results Needs Review    Patient understands prevention, detection, and treatment of acute complications. Needs Review    Patient understands  prevention, detection, and treatment of chronic complications. Needs Review    Patient understands how to develop strategies to address psychosocial issues. Needs Review    Patient understands how to develop strategies to promote health/change behavior. Needs Review      Outcomes   Expected Outcomes Other (comment)   mild interest in learning, question change   Future DMSE 2 months    Program Status Not Completed      Subsequent Visit   Since your last visit have you continued or begun to take your medications as prescribed? Yes    Since your last visit, are you checking your blood glucose at least once a day? Yes             Individualized Plan for Diabetes Self-Management Training:   Learning Objective:  Patient will have a greater understanding of diabetes self-management. Patient education plan is to attend individual and/or group sessions per assessed needs and concerns.   Plan:   Patient  Instructions  Avoid skipping meals Breakfast, Lunch, and Dinner daily 1/2 your plate should be vegetables Protein with every meal (meat, beans, nuts, peanut butter, egg, cheese) Carbohydrate (bread, rice, potatoes, corn, peas, beans, sweets, fruit, milk)  Treat low blood sugar with 1/2 cup juice or regular soda, wait 15 minutes, recheck sugar and when above 70 eat a snack with protein and carbohydrates.  Expected Outcomes:  Other (comment) (mild interest in learning, question change)  Education material provided: My Plate  If problems or questions, patient to contact team via:  Phone  Future DSME appointment: 2 months

## 2021-11-10 ENCOUNTER — Encounter: Payer: Self-pay | Admitting: Dietician

## 2021-11-17 ENCOUNTER — Telehealth: Payer: Self-pay | Admitting: Dietician

## 2021-11-17 NOTE — Telephone Encounter (Signed)
Patient states that he called the [facility] support and was told that his cell phone will no longer support the Dexcom G6 app as of 11/17/2021.  He will need a new Dexcom receiver as he lost his other receiver.  He also needs more sensors, another transmitter, and insulin pen needles.  Will message MD.  Patient to call for further questions.  Oran Rein, RD, LDN, CDCES

## 2021-11-17 NOTE — Telephone Encounter (Signed)
Patient called and stated that he got a message that his Dexcom app will not be working after 11/17/2021. He states that he is unable to do any updates on his phone.  Tech support number for Dexcom provided. Patient to call for further questions.  Oran Rein, RD, LDN, CDCES

## 2021-11-24 NOTE — Telephone Encounter (Signed)
Grandmother called as the items requested from MD last week did not go through to the pharmacy.  Discussed that I do not have the ability to order these and his MD was message last week.  She should contact his MD for the appropriate prescriptions.  Oran Rein, RD, LDN, CDCES

## 2021-11-26 ENCOUNTER — Ambulatory Visit (HOSPITAL_COMMUNITY): Payer: Medicaid Other

## 2021-11-26 ENCOUNTER — Ambulatory Visit (HOSPITAL_COMMUNITY): Payer: Medicaid Other | Admitting: *Deleted

## 2021-11-26 ENCOUNTER — Encounter (HOSPITAL_COMMUNITY): Payer: Self-pay

## 2021-11-26 VITALS — BP 118/83 | HR 95 | Ht 70.0 in | Wt 160.0 lb

## 2021-11-26 DIAGNOSIS — F251 Schizoaffective disorder, depressive type: Secondary | ICD-10-CM

## 2021-11-26 NOTE — Progress Notes (Signed)
In with his mom for a med check with provider and Clinical research associate as he started the 3 month Invega last month. The provider called out for today so seen only by writer and CMA. He and his mom and then I spoke with grandma on the phone who he lives with reported he is doing well. He states he cant tell any difference from the one month shot to the current every three month shot. He continues to work but hours being cut to approx 20 and he expects to work 40, Mom concerned his mcd will be stopped as he expects to get insurance thru his employer which is [employer] and when he does he will be transferred to the [place] office as we dont accept insurance. No complaints offered. Some issues around his diabetes which he sees a dr for. He states he is trying to do better with it. He is to return in two months for his next shot and to be seen by the provider.

## 2021-12-02 ENCOUNTER — Ambulatory Visit: Payer: Medicaid Other | Admitting: Student

## 2021-12-02 ENCOUNTER — Encounter: Payer: Self-pay | Admitting: Student

## 2021-12-02 ENCOUNTER — Other Ambulatory Visit (HOSPITAL_COMMUNITY): Payer: Self-pay

## 2021-12-02 ENCOUNTER — Other Ambulatory Visit: Payer: Self-pay

## 2021-12-02 VITALS — BP 125/80 | HR 85 | Temp 98.2°F | Ht 70.0 in | Wt 165.8 lb

## 2021-12-02 DIAGNOSIS — E109 Type 1 diabetes mellitus without complications: Secondary | ICD-10-CM

## 2021-12-02 DIAGNOSIS — F250 Schizoaffective disorder, bipolar type: Secondary | ICD-10-CM

## 2021-12-02 LAB — GLUCOSE, CAPILLARY: Glucose-Capillary: 88 mg/dL (ref 70–99)

## 2021-12-02 LAB — POCT GLYCOSYLATED HEMOGLOBIN (HGB A1C): Hemoglobin A1C: 10.2 % — AB (ref 4.0–5.6)

## 2021-12-02 MED ORDER — INSULIN DETEMIR 100 UNIT/ML FLEXPEN
25.0000 [IU] | PEN_INJECTOR | Freq: Every day | SUBCUTANEOUS | 5 refills | Status: DC
  Filled 2021-12-02: qty 3, 12d supply, fill #0
  Filled 2021-12-11: qty 3, 12d supply, fill #1
  Filled 2021-12-22: qty 3, 12d supply, fill #2

## 2021-12-02 MED ORDER — NOVOLOG FLEXPEN 100 UNIT/ML ~~LOC~~ SOPN: 7.0000 [IU] | PEN_INJECTOR | Freq: Three times a day (TID) | SUBCUTANEOUS | 11 refills | Status: DC

## 2021-12-02 MED ORDER — INSULIN DETEMIR 100 UNIT/ML ~~LOC~~ SOLN
25.0000 [IU] | Freq: Every day | SUBCUTANEOUS | 5 refills | Status: DC
  Filled 2021-12-02: qty 10, 40d supply, fill #0

## 2021-12-02 MED ORDER — INSULIN PEN NEEDLE 31G X 5 MM MISC
Freq: Three times a day (TID) | 2 refills | Status: DC
  Filled 2021-12-02: qty 200, 66d supply, fill #0

## 2021-12-02 MED ORDER — INSULIN DETEMIR 100 UNIT/ML ~~LOC~~ SOLN: 25.0000 [IU] | Freq: Every day | SUBCUTANEOUS | 5 refills | Status: DC

## 2021-12-02 MED ORDER — NOVOLOG FLEXPEN 100 UNIT/ML ~~LOC~~ SOPN
7.0000 [IU] | PEN_INJECTOR | Freq: Three times a day (TID) | SUBCUTANEOUS | 11 refills | Status: DC
  Filled 2021-12-02: qty 6, 28d supply, fill #0

## 2021-12-02 NOTE — Patient Instructions (Signed)
Thank you, [name] for allowing [place] to provide your care today. Today we discussed your diabetes and mental health concerns.  We are starting you on a new insulin regimen.  Make sure to stick with this regimen and follow-up with me in 2 weeks.  I have ordered the following labs for you:   Lab Orders         Glucose, capillary         POC Hbg A1C      I will call if any are abnormal. All of your labs can be accessed through "My Chart".  I have place a referrals to diabetes coordinator   I have ordered the following medication/changed the following medications:  Start Levemir 25 units daily Start NovoLog 7 units 3 times daily with meals  My Chart Access: [email or web address]  Please follow-up in 2 weeks.  Please make sure to arrive 15 minutes prior to your next appointment. If you arrive late, you may be asked to reschedule.    We look forward to seeing you next time. Please call our clinic at [phone] if you have any questions or concerns. The best time to call is Monday-Friday from 9am-4pm, but there is someone available 24/7. If after hours or the weekend, call the main hospital number and ask for the Internal Medicine Resident On-Call. If you need medication refills, please notify your pharmacy one week in advance and they will send [place] a request.   Thank you for letting us take part in your care. Wishing you the best!  Steffanie Rainwater, MD 12/02/2021, 4:03 PM IM Resident, PGY-2 [name] 41:10

## 2021-12-02 NOTE — Progress Notes (Signed)
° °  CC: To establish care  HPI:  [name] is a 31 y.o. M with PMH as below who presents to clinic today accompanied by grandmother to establish care. Please see problem based charting for evaluation, assessment and plan.  Past Medical History:  Diagnosis Date   Bipolar 1 disorder (HCC)    History of attempted suicide 2019   Unsuccessful suicide attempt in 2019 with rat poison   Schizoaffective disorder (HCC)    Type 1 diabetes mellitus on insulin therapy (HCC) 2020    Review of Systems:  Constitutional: Positive for polydipsia. Negative for fever or fatigue Eyes: Negative for visual changes Respiratory: Negative for shortness of breath Cardiac: Negative for chest pain MSK: Negative for back pain Abdomen: Negative for abdominal pain, constipation or diarrhea GU: Positive for polyuria Neuro: Negative for headache or weakness Psych: Positive for depression and anxiety  Physical Exam: General: Pleasant, well-developed young male. No acute distress. Cardiac: RRR. No murmurs, rubs or gallops. No LE edema Respiratory: Lungs CTAB. No wheezing or crackles. Abdominal: Soft, symmetric and non tender. Normal BS. Skin: Warm, dry and intact without rashes or lesions Extremities: Atraumatic. Full ROM. Palpable radial and DP pulses. Neuro: A&O x 3. Moves all extremities. Normal sensation.  Psych: Anxious mood.  Past Surgical Hx: N/A  Allergies: NKDA  Family Hx: History of diabetes in brother and paternal grandmother, cirrhosis in maternal grandmother, hypertension in paternal grandmother  Social Hx:  Lives with his grandmother who helps care for him. Finished [employer] and went to [employer] but did not finish. He works mostly night shift for [employer]. He enjoys walking. He drinks socially, about 1-2 times a month. He has been smoking about 0.5 ppd of cigarettes since the age of 53. He uses marijuana occasionally but no other illicit drug use.   Vitals:   12/02/21 1439   BP: 125/80  Pulse: 85  Temp: 98.2 F (36.8 C)  TempSrc: Oral  SpO2: 99%  Weight: 165 lb 12.8 oz (75.2 kg)  Height: 5\' 10"  (1.778 m)    Assessment & Plan:   See Encounters Tab for problem based charting.  Patient discussed with Dr. , MD, MPH

## 2021-12-03 ENCOUNTER — Other Ambulatory Visit: Payer: Self-pay

## 2021-12-03 ENCOUNTER — Other Ambulatory Visit (HOSPITAL_COMMUNITY): Payer: Self-pay

## 2021-12-03 ENCOUNTER — Encounter: Payer: Self-pay | Admitting: Student

## 2021-12-03 DIAGNOSIS — F259 Schizoaffective disorder, unspecified: Secondary | ICD-10-CM | POA: Insufficient documentation

## 2021-12-03 DIAGNOSIS — F250 Schizoaffective disorder, bipolar type: Secondary | ICD-10-CM | POA: Insufficient documentation

## 2021-12-03 LAB — MICROALBUMIN / CREATININE URINE RATIO
Creatinine, Urine: 175.9 mg/dL
Microalb/Creat Ratio: 3 mg/g{creat} (ref 0–29)
Microalbumin, Urine: 5.5 ug/mL

## 2021-12-03 NOTE — Assessment & Plan Note (Signed)
>>  ASSESSMENT AND PLAN FOR UNCONTROLLED TYPE 1 DIABETES MELLITUS WITH HYPOGLYCEMIA WITHOUT COMA (HCC) WRITTEN ON 12/03/2021 11:54 PM BY AMPONSAH, CLARETTA HERO, MD  Patient with a history of uncontrolled type 1 diabetes and multiple ER visits for hyperglycemia and DKA in the past here with his grandmother to establish care in this clinic.  A1c is 10.2% however blood sugar is 88 today.  He has had difficulty managing his blood sugars for the last few years.  He is currently on insulin  70/30 mix taking 30 units in the morning and 30 units at night.  He works night shifts for [employer] from 3 AM to 8 AM and weekends 11 PM to 7 AM during the weekday. His diet includes neck bones, greens, mashed potatoes and occasionally seafood and baked fish. States he has increased appetite and snacks throughout the day.  He endorses polyuria and polydipsia but denies any headaches, dizziness, syncope or blurry vision.  He admits that sometimes he forgets to take his insulin . He is being followed by [facility] endocrinology.  Patient counseled on the importance of sticking to his insulin  regimen and eating consistent meals throughout the day.  States he is motivated to get his blood sugar under control.  We will start patient on basal insulin  and meal coverage and closely monitor his blood sugars via his Dexcom.  Urine microalbumin/creatinine ratio within normal limits.  Plan: --Stop insulin  70/30 --Start Levemir  25 units daily (advised to take this in the evening) --Start NovoLog  7 units 3 times daily with meals --Referral to diabetes coordinator for diabetes/nutrition education -- Advised to follow-up in 2 weeks with meter

## 2021-12-03 NOTE — Assessment & Plan Note (Addendum)
Sees a therapist once a month at [facility].  Patient has a history of suicide attempts back in 2019 where he ingested rat poison.  He was transported to the ER via EMS and received treatments.  He denies any more suicide attempts since then.  Patient was initially on monthly Invega injections. Since she has been stable, patient has now been transitioned to injections every 3 months. --Continue Invega injections --Continue to follow-up with behavioral health

## 2021-12-03 NOTE — Assessment & Plan Note (Addendum)
Patient with a history of uncontrolled type 1 diabetes and multiple ER visits for hyperglycemia and DKA in the past here with his grandmother to establish care in this clinic.  A1c is 10.2% however blood sugar is 88 today.  He has had difficulty managing his blood sugars for the last few years.  He is currently on insulin 70/30 mix taking 30 units in the morning and 30 units at night.  He works night shifts for [employer] from 3 AM to 8 AM and weekends 11 PM to 7 AM during the weekday. His diet includes neck bones, greens, mashed potatoes and occasionally seafood and baked fish. States he has increased appetite and snacks throughout the day.  He endorses polyuria and polydipsia but denies any headaches, dizziness, syncope or blurry vision.  He admits that sometimes he forgets to take his insulin. He is being followed by [facility] endocrinology.  Patient counseled on the importance of sticking to his insulin regimen and eating consistent meals throughout the day.  States he is motivated to get his blood sugar under control.  We will start patient on basal insulin and meal coverage and closely monitor his blood sugars via his Dexcom.  Urine microalbumin/creatinine ratio within normal limits.  Plan: --Stop insulin 70/30 --Start Levemir 25 units daily (advised to take this in the evening) --Start NovoLog 7 units 3 times daily with meals --Referral to diabetes coordinator for diabetes/nutrition education -- Advised to follow-up in 2 weeks with meter

## 2021-12-04 NOTE — Telephone Encounter (Signed)
Lyndee Leo informed me that patient has already filled this prescription at [facility].

## 2021-12-04 NOTE — Progress Notes (Signed)
Internal Medicine Clinic Attending  Case discussed with Dr. Amponsah  At the time of the visit.  We reviewed the resident's history and exam and pertinent patient test results.  I agree with the assessment, diagnosis, and plan of care documented in the resident's note.  

## 2021-12-11 ENCOUNTER — Other Ambulatory Visit (HOSPITAL_COMMUNITY): Payer: Self-pay

## 2021-12-11 ENCOUNTER — Other Ambulatory Visit: Payer: Self-pay | Admitting: Student

## 2021-12-11 DIAGNOSIS — E109 Type 1 diabetes mellitus without complications: Secondary | ICD-10-CM

## 2021-12-11 MED ORDER — INSULIN DETEMIR 100 UNIT/ML FLEXPEN: 25.0000 [IU] | PEN_INJECTOR | Freq: Every day | SUBCUTANEOUS | 5 refills | Status: DC

## 2021-12-16 ENCOUNTER — Ambulatory Visit: Payer: Medicaid Other | Admitting: Student

## 2021-12-16 ENCOUNTER — Other Ambulatory Visit (HOSPITAL_COMMUNITY): Payer: Self-pay

## 2021-12-16 ENCOUNTER — Other Ambulatory Visit: Payer: Self-pay

## 2021-12-16 DIAGNOSIS — E109 Type 1 diabetes mellitus without complications: Secondary | ICD-10-CM

## 2021-12-16 MED ORDER — NOVOLOG FLEXPEN 100 UNIT/ML ~~LOC~~ SOPN
10.0000 [IU] | PEN_INJECTOR | Freq: Every day | SUBCUTANEOUS | 11 refills | Status: DC
  Filled 2021-12-16: qty 15, 62d supply, fill #0

## 2021-12-16 NOTE — Patient Instructions (Addendum)
Thank you, [name] for allowing [place] to provide your care today. Today we discussed your diabetes.  Your blood sugar still not at what we wanted to be.  We want you to make some changes to your insulin regimen and follow-up next week.  I have ordered the following medication/changed the following medications:  Take NovoLog 10 units with breakfast, 4 units with lunch and 10 units with supper  My Chart Access: [email or web address]  Please follow-up in 1 week  Please make sure to arrive 15 minutes prior to your next appointment. If you arrive late, you may be asked to reschedule.    We look forward to seeing you next time. Please call our clinic at [phone] if you have any questions or concerns. The best time to call is Monday-Friday from 9am-4pm, but there is someone available 24/7. If after hours or the weekend, call the main hospital number and ask for the Internal Medicine Resident On-Call. If you need medication refills, please notify your pharmacy one week in advance and they will send [place] a request.   Thank you for letting us take part in your care. Wishing you the best!  Jacob Rainwater, MD 12/16/2021, 3:56 PM IM Resident, PGY-2 [name] 41:10

## 2021-12-16 NOTE — Progress Notes (Signed)
° °  CC: Diabetes follow-up  HPI:  Mr.Duanne Posthumus is a 31 y.o. male with PMH as below who presents to clinic accompanied by grandmother for 2-week follow-up on his diabetes. Please see problem based charting for evaluation, assessment and plan.  Past Medical History:  Diagnosis Date   Bipolar 1 disorder (HCC)    History of attempted suicide 2019   Unsuccessful suicide attempt in 2019 with rat poison   Schizoaffective disorder (HCC)    Type 1 diabetes mellitus on insulin therapy (HCC) 2020    Review of Systems:  Constitutional: Negative for weight changes or fatigue Eyes: Negative for visual changes GU: Negative for polyuria Neuro: Negative for headache, dizziness or weakness Psych: Positive for anxiety  Physical Exam: General: Pleasant, young black male.  No acute distress. Cardiac: RRR. No murmurs, rubs or gallops. No LE edema Respiratory: Lungs CTAB. No wheezing or crackles. Abdominal: Soft, symmetric and non tender. Normal BS. Skin: Warm, dry and intact without rashes or lesions Neuro: A&O x 3. Moves all extremities. Normal sensation to gross touch Psych: Appropriate mood and affect.  Vitals:   12/16/21 1608  BP: 122/77  Pulse: 72     Assessment & Plan:   See Encounters Tab for problem based charting.  Patient discussed with Dr. Anthoney Harada, MD, MPH

## 2021-12-17 ENCOUNTER — Encounter: Payer: Self-pay | Admitting: Student

## 2021-12-17 NOTE — Progress Notes (Signed)
Internal Medicine Clinic Attending  Case discussed with Dr. Amponsah  At the time of the visit.  We reviewed the resident's history and exam and pertinent patient test results.  I agree with the assessment, diagnosis, and plan of care documented in the resident's note.  

## 2021-12-17 NOTE — Assessment & Plan Note (Signed)
>>  ASSESSMENT AND PLAN FOR UNCONTROLLED TYPE 1 DIABETES MELLITUS WITH HYPOGLYCEMIA WITHOUT COMA (HCC) WRITTEN ON 12/17/2021 11:14 AM BY AMPONSAH, CLARETTA HERO, MD  Patient here for 2-week follow-up with his grandmother after his insulin  70/30 mix was discontinued and started on Levemir  25 units daily plus NovoLog  7 units 3 times daily with meals.  Patient reports being consistent with a long-acting insulin  but sometimes forgets to take one of the 3 doses of his short acting. He takes Levemir  for his dosing with NovoLog  around 8 or 9 AM with breakfast after he gets off work, that he sleeps for 4-5 hours before taking his legs NovoLog  with light lunch around 1-2 PM, during sleep for couple hours before taking his last NovoLog  with supper around 6-6:30 PM. He goes to work around 11 PM or 3 AM. He has reduced the amount of snacks he is eating throughout the day.  On review of his Dexcom meter, patient is in range 40%, high 20%, very high 26%, below 70% and very low 7%.  Patient is low-dose seems to have been in the afternoon and highs has been in the morning after breakfast for the evening after dinner. Patient counseled on importance of managing his insulin  for the type and amount of meals he is eating. We will adjust insulin  appropriately and follow-up in 1 week. Normal foot exam today.  Plan: --Continue Levemir  25 units daily (patient takes this in the morning after work) -- Change NovoLog  to 10 units with breakfast, 4 units with lunch and 10 units with supper --RTC in 1 week --Referral for diabetes eye exam at next office visit

## 2021-12-17 NOTE — Assessment & Plan Note (Signed)
Patient here for 2-week follow-up with his grandmother after his insulin 70/30 mix was discontinued and started on Levemir 25 units daily plus NovoLog 7 units 3 times daily with meals.  Patient reports being consistent with a long-acting insulin but sometimes forgets to take one of the 3 doses of his short acting. He takes Levemir for his dosing with NovoLog around 8 or 9 AM with breakfast after he gets off work, that he sleeps for 4-5 hours before taking his legs NovoLog with light lunch around 1-2 PM, during sleep for couple hours before taking his last NovoLog with supper around 6-6:30 PM. He goes to work around 11 PM or 3 AM. He has reduced the amount of snacks he is eating throughout the day.  On review of his Dexcom meter, patient is in range 40%, high 20%, very high 26%, below 70% and very low 7%.  Patient is low-dose seems to have been in the afternoon and highs has been in the morning after breakfast for the evening after dinner. Patient counseled on importance of managing his insulin for the type and amount of meals he is eating. We will adjust insulin appropriately and follow-up in 1 week. Normal foot exam today.  Plan: --Continue Levemir 25 units daily (patient takes this in the morning after work) -- Change NovoLog to 10 units with breakfast, 4 units with lunch and 10 units with supper --RTC in 1 week --Referral for diabetes eye exam at next office visit

## 2021-12-21 ENCOUNTER — Other Ambulatory Visit (HOSPITAL_COMMUNITY): Payer: Self-pay

## 2021-12-22 ENCOUNTER — Other Ambulatory Visit (HOSPITAL_COMMUNITY): Payer: Self-pay

## 2021-12-23 ENCOUNTER — Encounter: Payer: Self-pay | Admitting: Student

## 2021-12-23 ENCOUNTER — Other Ambulatory Visit (HOSPITAL_COMMUNITY): Payer: Self-pay | Admitting: *Deleted

## 2021-12-23 ENCOUNTER — Other Ambulatory Visit (HOSPITAL_COMMUNITY): Payer: Self-pay

## 2021-12-23 ENCOUNTER — Encounter: Payer: Self-pay | Admitting: Dietician

## 2021-12-23 ENCOUNTER — Ambulatory Visit: Payer: Medicaid Other | Admitting: Student

## 2021-12-23 ENCOUNTER — Ambulatory Visit (INDEPENDENT_AMBULATORY_CARE_PROVIDER_SITE_OTHER): Payer: Medicaid Other | Admitting: Dietician

## 2021-12-23 ENCOUNTER — Other Ambulatory Visit: Payer: Self-pay

## 2021-12-23 DIAGNOSIS — E109 Type 1 diabetes mellitus without complications: Secondary | ICD-10-CM | POA: Diagnosis present

## 2021-12-23 DIAGNOSIS — F251 Schizoaffective disorder, depressive type: Secondary | ICD-10-CM

## 2021-12-23 MED ORDER — PALIPERIDONE PALMITATE ER 546 MG/1.75ML IM SUSY
561.0000 mg | PREFILLED_SYRINGE | INTRAMUSCULAR | Status: AC
  Administered 2022-11-10: 561 mg via INTRAMUSCULAR

## 2021-12-23 MED ORDER — NOVOLOG FLEXPEN 100 UNIT/ML ~~LOC~~ SOPN
10.0000 [IU] | PEN_INJECTOR | Freq: Every day | SUBCUTANEOUS | 11 refills | Status: DC
  Filled 2021-12-23: qty 15, 62d supply, fill #0

## 2021-12-23 MED ORDER — INSULIN DETEMIR 100 UNIT/ML FLEXPEN
28.0000 [IU] | PEN_INJECTOR | Freq: Every day | SUBCUTANEOUS | 2 refills | Status: DC
  Filled 2021-12-23 – 2021-12-25 (×2): qty 15, 53d supply, fill #0

## 2021-12-23 NOTE — Patient Instructions (Signed)
Thank you, [name] for allowing [place] to provide your care today. Today we discussed your blood sugar.  The readings for your meter shows that he still having significant highs especially in the evening.  We are making some changes to your regimen we want you to follow-up in about 2 weeks for [place] to readjust this.   I have ordered the following medication/changed the following medications:  Increase your Levemir from 25 units to 28 units daily For your NovoLog, take 10 units with breakfast, 4 units with lunch and 12 units with supper  My Chart Access: [email or web address]  Please follow-up in 2 weeks  Please make sure to arrive 15 minutes prior to your next appointment. If you arrive late, you may be asked to reschedule.    We look forward to seeing you next time. Please call our clinic at [phone] if you have any questions or concerns. The best time to call is Monday-Friday from 9am-4pm, but there is someone available 24/7. If after hours or the weekend, call the main hospital number and ask for the Internal Medicine Resident On-Call. If you need medication refills, please notify your pharmacy one week in advance and they will send [place] a request.   Thank you for letting us take part in your care. Wishing you the best!  Lacinda Axon, MD 12/23/2021, 11:55 AM IM Resident, PGY-2 Oswaldo Milian 41:10

## 2021-12-23 NOTE — Progress Notes (Signed)
° °  CC: Diabetes follow-up  HPI:  [name] is a 31 y.o. male with PMH as below who presents to the clinic accompanied by grandmother for follow-up on his diabetes. Please see problem based charting for evaluation, assessment and plan.  Past Medical History:  Diagnosis Date   Bipolar 1 disorder (HCC)    History of attempted suicide 2019   Unsuccessful suicide attempt in 2019 with rat poison   Schizoaffective disorder (HCC)    Type 1 diabetes mellitus on insulin therapy (HCC) 2020    Review of Systems:  Constitutional: Negative for polydipsia, weight changes, fever or fatigue Eyes: Negative for visual changes GU: Negative for polyuria Neuro: Negative for headache, dizziness or weakness  Physical Exam: General: Pleasant, well-appearing young male. No acute distress. Cardiac: RRR. No murmurs, rubs or gallops. No LE edema Respiratory: Lungs CTAB. No wheezing or crackles. Abdominal: Soft, symmetric and non tender. Normal BS. Skin: Warm, dry and intact without rashes or lesions Extremities: Atraumatic. Full ROM.  2+ radial pulses. Neuro: A&O x 3. Moves all extremities.  Normal sensation to gross touch.  Psych: Appropriate mood and affect.  Vitals:   12/23/21 1059  BP: 131/82  Pulse: 84  Temp: 98.1 F (36.7 C)  TempSrc: Oral  SpO2: 98%  Weight: 158 lb 9.6 oz (71.9 kg)  Height: 5\' 10"  (1.778 m)    Assessment & Plan:   See Encounters Tab for problem based charting.  Patient discussed with Dr. , MD, MPH

## 2021-12-23 NOTE — Patient Instructions (Addendum)
Good job caring for your diabetes!   Together we will help you improve your diabetes control.  Please inject Novolog insulin 15-20 minutes BEFORE first bite of a meal- breakfast, lunch and supper when blood sugar is 100-180.  You can wait 30 or more minutes before eating if blood sugar is > 180 mg/dl   Please use the pinch- up technique when giving yourself insulin injections.   Please [place] a different place each day for your injection as we discussed today.   We discussed that: fruits, sweets, starches, dairy and starchy vegetables   are high in CARBS and should be kept consistent from meal to meal ( Eat the same amount of servings for breakfast , lunch and dinner each day)   The CARBS you EAT at a meal need to match the NOVOLOG insulin you take BEFORE eating.  Thank you for your visit today!   I suggest canceling your appointment with Oran Rein and scheduling a follow up with  me on the same day you see your doctor in 2 weeks.   Call anytime!  [name] [phone]

## 2021-12-23 NOTE — Assessment & Plan Note (Signed)
>>  ASSESSMENT AND PLAN FOR UNCONTROLLED TYPE 1 DIABETES MELLITUS WITH HYPOGLYCEMIA WITHOUT COMA (HCC) WRITTEN ON 12/28/2021  1:36 PM BY AMPONSAH, CLARETTA HERO, MD  Patient here for 1 week follow-up on his diabetes after adjustment to his insulin  regimen during last visit.  On review of patient's meter patient's blood glucose is in range 39%, high 21%, very high 35%, low 4%, and very low 1%. Compared to last reading, patient's hypoglycemic episodes have decreased. The percentage of very high readings have increased about 10%. Further evaluation of his daily readings showed that most of the patient's high sugars high in the evenings after supper.  Patient received nutrition and diabetic management education from [place] today. Plan to increase patient's Levemir  and NovoLog  with supper to target high readings at night.   Plan: -- Increase Levemir  to 28 units daily -- Continue NovoLog  10 units before breakfast, 4 units before lunch and increase from 10 to 12 units before supper. -- Advised to continue to reduce carbohydrate load and sugary drinks. -- Follow-up in 2 weeks for further adjustment to current regimen.  Addendum: Patient has had nutrition/diabetes education by [name]. Please switch patient to Trulicity at next visit per Donna's recommendation. This will give him a much longer basal glucose control. Needs a referral to ophthalmologist for diabetes eye exam.

## 2021-12-23 NOTE — Assessment & Plan Note (Addendum)
Patient here for 1 week follow-up on his diabetes after adjustment to his insulin regimen during last visit.  On review of patient's meter patient's blood glucose is in range 39%, high 21%, very high 35%, low 4%, and very low 1%. Compared to last reading, patient's hypoglycemic episodes have decreased. The percentage of very high readings have increased about 10%. Further evaluation of his daily readings showed that most of the patient's high sugars high in the evenings after supper.  Patient received nutrition and diabetic management education from [place] today. Plan to increase patient's Levemir and NovoLog with supper to target high readings at night.   Plan: -- Increase Levemir to 28 units daily -- Continue NovoLog 10 units before breakfast, 4 units before lunch and increase from 10 to 12 units before supper. -- Advised to continue to reduce carbohydrate load and sugary drinks. -- Follow-up in 2 weeks for further adjustment to current regimen.  Addendum: Patient has had nutrition/diabetes education by [name]. Please switch patient to Trulicity at next visit per Donna's recommendation. This will give him a much longer basal glucose control. Needs a referral to ophthalmologist for diabetes eye exam.

## 2021-12-23 NOTE — Progress Notes (Addendum)
Diabetes Self-Management Education  Visit Type:  Follow-up (now seeing [facility] doctors so switching [facility] to [facility] as well)  Appt. Start Time: 1130 Appt. 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 male with a diagnosis of Diabetes:  Marland Kitchen  Type 1  ASSESSMENT  Estimated body mass index is 22.76 kg/m as calculated from the following:   Height as of an earlier encounter on 12/23/21: 5\' 10"  (1.778 m).   Weight as of an earlier encounter on 12/23/21: 158 lb 9.6 oz (71.9 kg). Wt Readings from Last 10 Encounters:  12/23/21 158 lb 9.6 oz (71.9 kg)  12/02/21 165 lb 12.8 oz (75.2 kg)  10/26/21 160 lb (72.6 kg)  09/11/21 154 lb (69.9 kg)  07/06/21 160 lb (72.6 kg)  06/24/21 152 lb 1.9 oz (69 kg)  07/28/20 130 lb (59 kg)  06/05/20 130 lb (59 kg)  03/03/20 130 lb (59 kg)  11/06/18 133 lb 3.2 oz (60.4 kg)   Lab Results  Component Value Date   HGBA1C 10.2 (A) 12/02/2021   HGBA1C >15.5 (H) 07/28/2020   HGBA1C 11.9 (H) 06/08/2020   HGBA1C 15.0 (H) 03/04/2020   HGBA1C 9.4 (H) 11/16/2019     CGM Results from download:   % Time CGM active:   94.3 %   (Goal >70%)  Average glucose:   203 mg/dL for 14 days  Glucose management indicator:   8.2 %  Time in range (70-180 mg/dL):   39 %   (Goal >91%)  Time High (181-250 mg/dL):   21 %   (Goal < 47%)  Time Very High (>250 mg/dL):    35 %   (Goal < 5%)  Time Low (54-69 mg/dL):   4 %   (Goal <8%)  Time Very Low (<54 mg/dL):   1 %   (Goal <2%)  Coefficient of variation:   45.8 %   (Goal <36%)   Ambulatory Glucose Profile shows hypoglycemia at 12 midnight and 3 PM. It shows both daytime and nighttime pattern of highs as well. Many postmeal spikes are up to 300-350 mg/dl. . Note that his low is set for 60mg /dL, will suggest he increase it to at least 70 or 75 mg/dL    Diabetes Self-Management Education - 12/23/21 1300       Health Coping   How would you rate your overall health? Good      Psychosocial  Assessment   Patient Belief/Attitude about Diabetes Motivated to manage diabetes    Self-care barriers Lack of material resources;Unable to determine    Self-management support Family;CDE visits    Patient Concerns Glycemic Control    Special Needs Instruct caregiver;Simplified materials    Preferred Learning Style Hands on;Visual    Learning Readiness Ready      Pre-Education Assessment   Patient understands the diabetes disease and treatment process. Demonstrates understanding / competency    Patient understands incorporating nutritional management into lifestyle. Needs Review    Patient undertands incorporating physical activity into lifestyle. Needs Review    Patient understands using medications safely. Needs Review    Patient understands monitoring blood glucose, interpreting and using results Needs Review    Patient understands prevention, detection, and treatment of acute complications. Needs Review    Patient understands prevention, detection, and treatment of chronic complications. Needs Review    Patient understands how to develop strategies to address psychosocial issues. Demonstrates understanding / competency    Patient understands  how to develop strategies to promote health/change behavior. Demonstrates understanding / competency      Complications   Last HgB A1C per patient/outside source 10.2 %    How often do you check your blood sugar? > 4 times/day   continuously with dexcom   Fasting Blood glucose range (mg/dL) [phone];688-648;>472    Postprandial Blood glucose range (mg/dL) <07;>218    Number of hypoglycemic episodes per month 10    Can you tell when your blood sugar is low? No    Number of hyperglycemic episodes per week 10    Can you tell when your blood sugar is high? No    Have you had a dilated eye exam in the past 12 months? No    Have you had a dental exam in the past 12 months? No    Are you checking your feet? No      Dietary Intake   Breakfast 830-9am  [place] sausage patty x2, 1-2 eggs, 1 c oatmeal with blueberries, SF Koolaid    Lunch 12-1 handful party chips, SF koolaid, 1-2 c cheerios w/ almond milk, or salda with tuna & vinegarette, sf koolaid    Dinner [phone] PM baked chix or fish sometimes fried, str beans, squash & broccoli casserole, SF cookies    Snack (evening) cheese    Beverage(s) water, SF koolaid      Exercise   Exercise Type ADL's;Light (walking / raking leaves)    How many days per week to you exercise? 5    How many minutes per day do you exercise? 240    Total minutes per week of exercise 1200      Patient Education   Previous Diabetes Education Yes (please comment)   at [facility]   Nutrition management  Meal timing in regards to the patients' current diabetes medication.   He has been taking Novolog after finishing his meal.   Medications Taught/reviewed insulin injection, site rotation, insulin storage and needle disposal.;Reviewed patients medication for diabetes, action, purpose, timing of dose and side effects.   reveiwed proper injection technique of pinching up, where to inject, proper roatation of sites, and timing of insulin   Monitoring Other (comment)   discussed interpretation of CGM report and possible causes of low blood sugar     Individualized Goals (developed by patient)   Medications take my medication as prescribed   rotating sites, pinching up and before meals     Outcomes   Program Status Not Completed      Subsequent Visit   Since your last visit have you continued or begun to take your medications as prescribed? Yes    Since your last visit have you had your blood pressure checked? Yes    Is your most recent blood pressure lower, unchanged, or higher since your last visit? Unchanged    Since your last visit have you experienced any weight changes? No change    Since your last visit, are you checking your blood glucose at least once a day? Yes   uses dexcom CGM, report summary in assessment             Learning Objective:  Patient will have a greater understanding of diabetes self-management. Patient education plan is to attend individual and/or group sessions per assessed needs and concerns.   Plan:   Patient Instructions  Good job caring for your diabetes!   Together we will help you improve your diabetes control.  Please inject Novolog insulin 15-20 minutes BEFORE  first bite of a meal- breakfast, lunch and supper when blood sugar is 100-180.  You can wait 30 or more minutes before eating if blood sugar is > 180 mg/dl   Please use the pinch- up technique when giving yourself insulin injections.   Please [place] a different place each day for your injection as we discussed today.   Thank you for your visit today!   I suggest canceling your appointment with Oran Rein and scheduling a follow up with  me on the same day you see your doctor in 2 weeks.   Call anytime!  [name] [phone]    Expected Outcomes:  Demonstrated interest in learning. Expect positive outcomes  Education material provided: Diabetes Resources  If problems or questions, patient to contact team via:  Phone  Future DSME appointment: - 2 wks [name], RD 12/23/2021 1:41 PM.

## 2021-12-25 ENCOUNTER — Other Ambulatory Visit (HOSPITAL_COMMUNITY): Payer: Self-pay

## 2021-12-25 NOTE — Progress Notes (Signed)
Internal Medicine Clinic Attending  Case discussed with Dr. Amponsah  At the time of the visit.  We reviewed the resident's history and exam and pertinent patient test results.  I agree with the assessment, diagnosis, and plan of care documented in the resident's note.  

## 2021-12-28 ENCOUNTER — Telehealth: Payer: Self-pay | Admitting: Dietician

## 2021-12-28 DIAGNOSIS — E109 Type 1 diabetes mellitus without complications: Secondary | ICD-10-CM

## 2021-12-28 MED ORDER — DEXCOM G6 TRANSMITTER MISC: 3 refills | Status: DC

## 2021-12-28 MED ORDER — INSULIN PEN NEEDLE 32G X 4 MM MISC: 3 refills | Status: DC

## 2021-12-28 MED ORDER — DEXCOM G6 SENSOR MISC: 3 refills | Status: DC

## 2021-12-28 NOTE — Telephone Encounter (Signed)
Sounds good. I just signed the order.

## 2021-12-28 NOTE — Telephone Encounter (Signed)
Call to Jacob [name] to ask him to increase his low blood sugar alert to 75 mg/dL HJe said he did this while on the phone. He also requested the 4 mm pen needles and supplies for his Dexcom CGM sent to [facility].

## 2022-01-04 ENCOUNTER — Ambulatory Visit: Payer: Medicaid Other | Admitting: Dietician

## 2022-01-07 ENCOUNTER — Ambulatory Visit (INDEPENDENT_AMBULATORY_CARE_PROVIDER_SITE_OTHER): Payer: Medicaid Other | Admitting: Dietician

## 2022-01-07 ENCOUNTER — Encounter: Payer: Self-pay | Admitting: Student

## 2022-01-07 ENCOUNTER — Telehealth: Payer: Self-pay | Admitting: Dietician

## 2022-01-07 ENCOUNTER — Other Ambulatory Visit (HOSPITAL_COMMUNITY): Payer: Self-pay

## 2022-01-07 ENCOUNTER — Encounter: Payer: Self-pay | Admitting: Dietician

## 2022-01-07 ENCOUNTER — Ambulatory Visit: Payer: Medicaid Other | Admitting: Student

## 2022-01-07 DIAGNOSIS — E109 Type 1 diabetes mellitus without complications: Secondary | ICD-10-CM

## 2022-01-07 MED ORDER — NOVOLOG FLEXPEN 100 UNIT/ML ~~LOC~~ SOPN
PEN_INJECTOR | SUBCUTANEOUS | 2 refills | Status: DC
  Filled 2022-01-07: qty 15, 83d supply, fill #0

## 2022-01-07 MED ORDER — INSULIN DEGLUDEC 100 UNIT/ML ~~LOC~~ SOPN: 22.0000 [IU] | PEN_INJECTOR | Freq: Every day | SUBCUTANEOUS | 0 refills | Status: DC

## 2022-01-07 MED ORDER — NOVOLOG FLEXPEN 100 UNIT/ML ~~LOC~~ SOPN: PEN_INJECTOR | SUBCUTANEOUS | 2 refills | Status: DC

## 2022-01-07 MED ORDER — INSULIN DEGLUDEC 100 UNIT/ML ~~LOC~~ SOPN
22.0000 [IU] | PEN_INJECTOR | Freq: Every day | SUBCUTANEOUS | 0 refills | Status: DC
  Filled 2022-01-07: qty 14, 63d supply, fill #0

## 2022-01-07 NOTE — Telephone Encounter (Signed)
Call to schedule follow up appointment. Grandmother will call back top make appointment myself and doctor on same day ?

## 2022-01-07 NOTE — Progress Notes (Addendum)
Diabetes Self-Management Education ? ?Visit Type:  Follow-up (Comprehensive) ? ?Appt. Start Time: 11:25 Appt. End Time: 12:10 ? ?01/07/2022 ? 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 male with a diagnosis of Diabetes:  .   ?ASSESSMENT ? ?Discussed pt's current routine and typical eating patterns. Encouraged pt to eliminate frequent sweetened beverages.  ?Encouraged him to eat carbs during meals, not between meals ?Suggested 3 carbohydrate servings at meals ? ?Educated pt on hypoglycemia symptoms ? Wokls at [employer] Ex 11 p- 8 a or 3 a- 8 am.  Sleeps 9a- 2 P meals are at ~ 8 am, 2- 3 Pm and 5- 7 PM ? Diabetes Self-Management Education - 01/07/22 1500   ? ?  ? Health Coping  ? How would you rate your overall health? Good   ?  ? Patient Education  ? Nutrition management  Carbohydrate counting;Reviewed blood glucose goals for pre and post meals and how to evaluate the patients' food intake on their blood glucose level.;Effects of alcohol on blood glucose and safety factors with consumption of alcohol.;Meal options for control of blood glucose level and chronic complications.   ? Monitoring Interpreting lab values - A1C, lipid, urine microalbumina.   ? Acute complications Taught treatment of hypoglycemia - the 15 rule.   ?  ? Individualized Goals (developed by patient)  ? Reducing Risk treat hypoglycemia with 15 grams of carbs if blood glucose less than 70mg /dL   ?  ? Patient Self-Evaluation of Goals - Patient rates self as meeting previously set goals (% of time)  ? Medications 50 - 75 %   He says he is taking insulin before meals and rotating sites  ?  ? Outcomes  ? Program Status Not Completed   ?  ? Subsequent Visit  ? Since your last visit have you continued or begun to take your medications as prescribed? Yes   Grandmother knew, pt was unsure  ? Since your last visit have you had your blood pressure checked? No   ? Is your most recent blood pressure lower, unchanged, or higher since your last  visit? N/A   ? Since your last visit have you experienced any weight changes? No change   ? Since your last visit, are you checking your blood glucose at least once a day? Yes   ? ?  ?  ? ?  ? ? ?Learning Objective:  Patient will have a greater understanding of diabetes self-management. ?Patient education plan is to attend individual and/or group sessions per assessed needs and concerns. ? ? ?Plan:  ? ?There are no Patient Instructions on file for this visit. ? ? ?Expected Outcomes:  Demonstrated interest in learning. Expect positive outcomes ? ?Education material provided: Diabetes Resources ? ?If problems or questions, patient to contact team via:  Phone ? ?Future DSME appointment: - 2 wks ? , RD ?01/07/2022 ?3:26 PM. ? ?

## 2022-01-07 NOTE — Progress Notes (Signed)
? ?  CC: diabetes follow-up ? ?HPI: ? ?[name] is a 31 y.o. person with medical history as below presenting to [facility] for diabetes follow-up ? ?Please see problem-based list for further details, assessments, and plans. ? ?Past Medical History:  ?Diagnosis Date  ? Bipolar 1 disorder (HCC)   ? History of attempted suicide 2019  ? Unsuccessful suicide attempt in 2019 with rat poison  ? Schizoaffective disorder (HCC)   ? Type 1 diabetes mellitus on insulin therapy (HCC) 2020  ? ?Review of Systems:  As per HPI ? ?Physical Exam: ? ?Vitals:  ? 01/07/22 1029  ?BP: (!) 103/59  ?Pulse: 80  ?Temp: 97.9 ?F (36.6 ?C)  ?TempSrc: Oral  ?SpO2: 99%  ?Weight: 163 lb 12.8 oz (74.3 kg)  ?Height: 5\' 10"  (1.778 m)  ? ?General: Resting comfortably in no acute distress ?CV: Regular rate, rhythm. No murmurs. ?Pulm: Normal respiratory effort on room air. ?Psych: Normal mood, speech. ? ?Assessment & Plan:  ? ?Insulin dependent diabetes mellitus type IA (HCC) ?Patient is here for diabetes follow-up. During most recent visit, long-acting insulin was increased. Per patient's DexCom report today, 62% very high/high, 31% in range, 7% low. He takes Levemir in the morning. Per graph on report, patient remains elevated overnight but experiences some hypoglycemic episodes during the day. He denies any symptoms, including cold sweats, dizziness. Reports compliance with medication. He is accompanied by his grandmother, who reports he eats regular meals with breakfast, lunch, and dinner but has snacks throughout the day. ? ?Concerned patient is not getting adequate basal insulin with Levemir once daily. We will switch today to ultra-long acting degludec w/ 20% decrease and adjust short-acting as well. Will continue to encourage [name] to follow-up with diabetes education with .  ? ?- STOP Levemir ?- START 22u Tresiba daily ?- Adjust Novolog 8u before breakfast, 2u before lunch, 8u before dinner ?- Return to clinic in two weeks ? ?Patient  discussed with Dr. [id number] ? ?Sol Blazing, MD ?Internal Medicine PGY-2 ?Pager: [phone] ? ? ?

## 2022-01-07 NOTE — Patient Instructions (Signed)
Jacob [name], it was a pleasure seeing you today! ? ?Today we discussed: ?- I am going to adjust your insulin regimen as follows: ?  ?-Switch your long-acting insulin to Tresiba (degludec). I want you to take 22 units daily. Do NOT take the detemir. ? ? - Short-acting: ? 8 units with breakfast ? 2 units with lunch ? 8 units with supper ? ?- Return in 2 weeks  ? ?I have ordered the following medication/changed the following medications:  ? ?Stop the following medications: ? DETEMIR (LEVEMIR) ? ?Start the following medications: ?Meds ordered this encounter  ?Medications  ? insulin degludec (TRESIBA) 100 UNIT/ML FlexTouch Pen  ?  Sig: Inject 22 Units into the skin daily.  ?  Dispense:  14 mL  ?  Refill:  0  ? insulin aspart (NOVOLOG FLEXPEN) 100 UNIT/ML FlexPen  ?  Sig: Inject 8 units with breakfast, 2 units with lunch, and 8 units with supper  ?  Dispense:  15 mL  ?  Refill:  2  ?  IM program  ?  ? ?Follow-up:  2 weeks   ? ?Please make sure to arrive 15 minutes prior to your next appointment. If you arrive late, you may be asked to reschedule.  ? ?We look forward to seeing you next time. Please call our clinic at [phone] if you have any questions or concerns. The best time to call is Monday-Friday from 9am-4pm, but there is someone available 24/7. If after hours or the weekend, call the main hospital number and ask for the Internal Medicine Resident On-Call. If you need medication refills, please notify your pharmacy one week in advance and they will send [place] a request. ? ?Thank you for letting us take part in your care. Wishing you the best! ? ?Thank you, ?Evlyn Kanner, MD ? ?

## 2022-01-11 NOTE — Progress Notes (Signed)
Internal Medicine Clinic Attending ? ?Case discussed with Dr. Braswell  At the time of the visit.  We reviewed the resident?s history and exam and pertinent patient test results.  I agree with the assessment, diagnosis, and plan of care documented in the resident?s note.  ?

## 2022-01-11 NOTE — Assessment & Plan Note (Addendum)
Patient is here for diabetes follow-up. During most recent visit, long-acting insulin was increased. Per patient's DexCom report today, 62% very high/high, 31% in range, 7% low. He takes Levemir in the morning. Per graph on report, patient remains elevated overnight but experiences some hypoglycemic episodes during the day. He denies any symptoms, including cold sweats, dizziness. Reports compliance with medication. He is accompanied by his grandmother, who reports he eats regular meals with breakfast, lunch, and dinner but has snacks throughout the day. ? ?Concerned patient is not getting adequate basal insulin with Levemir once daily. We will switch today to ultra-long acting degludec w/ 20% decrease and adjust short-acting as well. Will continue to encourage Jacob [name] to follow-up with diabetes education with [name].  ? ?- STOP Levemir ?- START 22u Tresiba daily ?- Adjust Novolog 8u before breakfast, 2u before lunch, 8u before dinner ?- Return to clinic in two weeks ?

## 2022-01-11 NOTE — Assessment & Plan Note (Signed)
>>  ASSESSMENT AND PLAN FOR UNCONTROLLED TYPE 1 DIABETES MELLITUS WITH HYPOGLYCEMIA WITHOUT COMA (HCC) WRITTEN ON 01/11/2022 10:05 AM BY BRASWELL, PHILLIP, MD  Patient is here for diabetes follow-up. During most recent visit, long-acting insulin  was increased. Per patient's DexCom report today, 62% very high/high, 31% in range, 7% low. He takes Levemir  in the morning. Per graph on report, patient remains elevated overnight but experiences some hypoglycemic episodes during the day. He denies any symptoms, including cold sweats, dizziness. Reports compliance with medication. He is accompanied by his grandmother, who reports he eats regular meals with breakfast, lunch, and dinner but has snacks throughout the day.  Concerned patient is not getting adequate basal insulin  with Levemir  once daily. We will switch today to ultra-long acting degludec w/ 20% decrease and adjust short-acting as well. Will continue to encourage Jacob [name] to follow-up with diabetes education with [name].   - STOP Levemir  - START 22u Tresiba  daily - Adjust Novolog  8u before breakfast, 2u before lunch, 8u before dinner - Return to clinic in two weeks

## 2022-01-12 ENCOUNTER — Telehealth: Payer: Self-pay

## 2022-01-12 NOTE — Telephone Encounter (Signed)
Pa  for pt ( TRESIBA FLEX TOUCH )  & ( INSULIN ASPART FEX PEN )  ? Came through on cover my meds pt has Medicaid so pa was submitted to [place] track  along with office notes from Dr Marijo Conception and [name]  on 01/11/22...  ? ? ? Medicaid usually notifies pt by mail in a few days to maybe 1 week of approval or denial  ?

## 2022-01-20 ENCOUNTER — Other Ambulatory Visit: Payer: Self-pay

## 2022-01-20 ENCOUNTER — Ambulatory Visit: Payer: Medicaid Other | Admitting: Student

## 2022-01-20 ENCOUNTER — Encounter: Payer: Self-pay | Admitting: Student

## 2022-01-20 DIAGNOSIS — E109 Type 1 diabetes mellitus without complications: Secondary | ICD-10-CM

## 2022-01-20 MED ORDER — NOVOLOG FLEXPEN 100 UNIT/ML ~~LOC~~ SOPN: 4.0000 [IU] | PEN_INJECTOR | Freq: Three times a day (TID) | SUBCUTANEOUS | 2 refills | Status: DC

## 2022-01-20 NOTE — Assessment & Plan Note (Signed)
>>  ASSESSMENT AND PLAN FOR UNCONTROLLED TYPE 1 DIABETES MELLITUS WITH HYPOGLYCEMIA WITHOUT COMA (HCC) WRITTEN ON 01/20/2022  2:00 PM BY BRASWELL, PHILLIP, MD  Patient is presenting today for diabetes follow-up. During his last visit with me, we adjusted his short-acting insulin  and switched his long-acting insulin  to Tresiba . Unfortunately, due to prior authorization, patient was unable to get this until a few days ago. Therefore, most of the data from his DexCom report is with his previous long-acting Levemir . He reports no issues with Tresiba . Denies any symptomatic hypoglycemic episodes. Mentions that he takes his long-acting insulin  in the morning and eats breakfast ~9AM, lunch ~2PM, and dinner ~7PM.   Patient's current regimen is Tresiba  22u daily, Novolog  8u with breakfast, 2u with lunch, 8u with dinner. Per DexCom report, 58% very high/high, 36% in range, and 6% low/very low. Per overall and daily chart, he is consistently hypoglycemic following breakfast and dinner. In addition, he is hyperglycemic after lunch. Today we will plan to adjust his short-acting insulin  and will re-assess response of Tresiba  at his next visit in two weeks.  - Tresiba  22u daily - Novolog  4u TID WC - Return to clinic in two weeks

## 2022-01-20 NOTE — Progress Notes (Signed)
? ?  CC: diabetes follow-up ? ?HPI: ? ?[name] is a 31 y.o. person with medical history as below presenting to [facility] for diabetes follow-up. ? ?Please see problem-based list for further details, assessments, and plans. ? ?Past Medical History:  ?Diagnosis Date  ? Bipolar 1 disorder (HCC)   ? History of attempted suicide 2019  ? Unsuccessful suicide attempt in 2019 with rat poison  ? Schizoaffective disorder (HCC)   ? Type 1 diabetes mellitus on insulin therapy (HCC) 2020  ? ?Review of Systems:  As per HPI ? ?Physical Exam: ? ?Vitals:  ? 01/20/22 1328  ?BP: 104/62  ?Pulse: 89  ?Temp: (!) 97.5 ?F (36.4 ?C)  ?TempSrc: Oral  ?SpO2: 99%  ?Weight: 160 lb 1.6 oz (72.6 kg)  ?Height: 5\' 9"  (1.753 m)  ? ?General: Resting comfortably in no acute distress ?Pulm: Normal respiratory effort on room air. ?MSK: Normal bulk, tone. No pitting edema bilateral lower extremities. ?Neuro: Awake, alert, conversing appropriately.  ? ?Assessment & Plan:  ? ?Insulin dependent diabetes mellitus type IA (HCC) ?Patient is presenting today for diabetes follow-up. During his last visit with me, we adjusted his short-acting insulin and switched his long-acting insulin to . Unfortunately, due to prior authorization, patient was unable to get this until a few days ago. Therefore, most of the data from his DexCom report is with his previous long-acting Levemir. He reports no issues with [place]. Denies any symptomatic hypoglycemic episodes. Mentions that he takes his long-acting insulin in the morning and eats breakfast ~9AM, lunch ~2PM, and dinner ~7PM.  ? ?Patient's current regimen is [place] 22u daily, Novolog 8u with breakfast, 2u with lunch, 8u with dinner. Per DexCom report, 58% very high/high, 36% in range, and 6% low/very low. Per overall and daily chart, he is consistently hypoglycemic following breakfast and dinner. In addition, he is hyperglycemic after lunch. Today we will plan to adjust his short-acting insulin and will  re-assess response of Tresiba at his next visit in two weeks. ? ?- Tresiba 22u daily ?- Novolog 4u TID WC ?- Return to clinic in two weeks ? ? ?Patient discussed with Dr. 12-05-1976 ? ?Heide Spark, MD ?Internal Medicine PGY-2 ?Pager: [phone] ? ?

## 2022-01-20 NOTE — Assessment & Plan Note (Signed)
Patient is presenting today for diabetes follow-up. During his last visit with me, we adjusted his short-acting insulin and switched his long-acting insulin to [place]. Unfortunately, due to prior authorization, patient was unable to get this until a few days ago. Therefore, most of the data from his DexCom report is with his previous long-acting Levemir. He reports no issues with [place]. Denies any symptomatic hypoglycemic episodes. Mentions that he takes his long-acting insulin in the morning and eats breakfast ~9AM, lunch ~2PM, and dinner ~7PM.  ? ?Patient's current regimen is [place] 22u daily, Novolog 8u with breakfast, 2u with lunch, 8u with dinner. Per DexCom report, 58% very high/high, 36% in range, and 6% low/very low. Per overall and daily chart, he is consistently hypoglycemic following breakfast and dinner. In addition, he is hyperglycemic after lunch. Today we will plan to adjust his short-acting insulin and will re-assess response of Tresiba at his next visit in two weeks. ? ?- Tresiba 22u daily ?- Novolog 4u TID WC ?- Return to clinic in two weeks ?

## 2022-01-20 NOTE — Patient Instructions (Signed)
[name], it was a pleasure seeing you today! ? ?Today we discussed: ?- I would like for you to adjust your mealtime insulin. I want you to take 4u of Novolog with breakfast, lunch, and dinner. We will see you back in two weeks to re-assess. ? ?Follow-up:  2 weeks   ? ?Please make sure to arrive 15 minutes prior to your next appointment. If you arrive late, you may be asked to reschedule.  ? ?We look forward to seeing you next time. Please call our clinic at [phone] if you have any questions or concerns. The best time to call is Monday-Friday from 9am-4pm, but there is someone available 24/7. If after hours or the weekend, call the main hospital number and ask for the Internal Medicine Resident On-Call. If you need medication refills, please notify your pharmacy one week in advance and they will send [place] a request. ? ?Thank you for letting us take part in your care. Wishing you the best! ? ?Thank you, ?Evlyn Kanner, MD ? ?

## 2022-01-22 NOTE — Progress Notes (Signed)
Internal Medicine Clinic Attending ? ?Case discussed with Dr. Braswell  At the time of the visit.  We reviewed the resident?s history and exam and pertinent patient test results.  I agree with the assessment, diagnosis, and plan of care documented in the resident?s note.  ?

## 2022-01-28 ENCOUNTER — Encounter (HOSPITAL_COMMUNITY): Payer: Self-pay

## 2022-01-28 ENCOUNTER — Ambulatory Visit (HOSPITAL_COMMUNITY): Payer: Medicaid Other

## 2022-01-28 ENCOUNTER — Ambulatory Visit (INDEPENDENT_AMBULATORY_CARE_PROVIDER_SITE_OTHER): Payer: Medicaid Other | Admitting: Registered Nurse

## 2022-01-28 DIAGNOSIS — F251 Schizoaffective disorder, depressive type: Secondary | ICD-10-CM

## 2022-01-28 NOTE — Progress Notes (Signed)
BH MD/PA/NP OP Progress Note ? ?01/28/2022 2:24 PM ?Jacob [name]  ?MRN:  [id number] ? ?Chief Complaint:  ?Chief Complaint   ?follow up long acting injectable ?  ? ?HPI:  ?Jacob [name], 31 y.o., male seen today for follow up and administration of long acting injectable Hinda Glatter(Invega Trinza) and psychiatric evaluation.  Jacob [name] has a psychiatric history of schizoaffective disorder depressive type, Major depressive disorder with psychotic features, and suicidal ideation.  He is currently being managed on Invega Trinza 561.6 mg every 3 months.  Patient reporting medication has been working well and no adverse reaction.  Patient reporting he for got to pick up his medication from pharmacy and bring in for injection.  Patient informed that there are no samples of the [place] on site and he would need to bring his medication. Patient is accompanied by his grandmother who state he really needs to get his shot.  She can tell "its wearing off and time for his next one."  Patient informed that he will be able to return Tuesday morning with medication for his injection.   ?Today Jacob [name] is dressed appropriated for weather and well groomed.  He reports there has been no depression and minimal anxiety.  Patient reporting no stress but states he has noticed for the last week he has been pacing which helps. He reports that he feels his mental health is stable and that he has been doing well.  GAD-7, PHQ-9, CSRRS, AIMS conducted by provider see scores below.  He denies auditory/visual hallucinations and paranoia stating that the [place] continues to help with his mood.  Today Jacob [name] denies suicidal/self-harm/homicidal ideation, psychosis, and paranoia.  He reports he has been sleeping and eating with out any difficulty.   ? ?Visit Diagnosis:  ?  ICD-10-CM   ?1. Schizoaffective disorder, depressive type (HCC)  F25.1 Paliperidone Hinda Glatter(Invega Trinza) 561.6 mg every 3 months.    ?  ? ? ?Past Psychiatric History:  Schizoaffective disorder depressive type, MDD with psychotic features and SI ? ?Past Medical History:  ?Past Medical History:  ?Diagnosis Date  ? Bipolar 1 disorder (HCC)   ? History of attempted suicide 2019  ? Unsuccessful suicide attempt in 2019 with rat poison  ? Schizoaffective disorder (HCC)   ? Type 1 diabetes mellitus on insulin therapy (HCC) 2020  ? No past surgical history on file. ? ?Family Psychiatric History: None on file ? ?Family History:  ?Family History  ?Problem Relation Age of Onset  ? Healthy Mother   ? Healthy Father   ? ? ?Social History:  ?Social History  ? ?Socioeconomic History  ? Marital status: Single  ?  Spouse name: Not on file  ? Number of children: Not on file  ? Years of education: Not on file  ? Highest education level: Not on file  ?Occupational History  ? Not on file  ?Tobacco Use  ? Smoking status: Some Days  ?  Types: Cigarettes  ? Smokeless tobacco: Never  ? Tobacco comments:  ?  2 BLACK AND MILD A DAY  ?Vaping Use  ? Vaping Use: Never used  ?Substance and Sexual Activity  ? Alcohol use: No  ? Drug use: Yes  ?  Types: Marijuana  ? Sexual activity: Not on file  ?Other Topics Concern  ? Not on file  ?Social History Narrative  ? Not on file  ? ?Social Determinants of Health  ? ?Financial Resource Strain: Not on file  ?Food Insecurity: Not on file  ?  Transportation Needs: Not on file  ?Physical Activity: Not on file  ?Stress: Not on file  ?Social Connections: Not on file  ? ? ?Allergies: No Known Allergies ? ?Metabolic Disorder Labs: ?Lab Results  ?Component Value Date  ? HGBA1C 10.2 (A) 12/02/2021  ? MPG >398 07/28/2020  ? MPG 294.83 06/08/2020  ? ?No results found for: PROLACTIN ?Lab Results  ?Component Value Date  ? CHOL 178 11/16/2019  ? TRIG 41 11/16/2019  ? HDL 79 11/16/2019  ? CHOLHDL 2.3 11/16/2019  ? VLDL 8 11/16/2019  ? LDLCALC 91 11/16/2019  ? LDLCALC 114 (H) 02/06/2018  ? ?Lab Results  ?Component Value Date  ? TSH 1.568 11/16/2019  ? TSH 1.277 02/06/2018  ? ? ?Therapeutic  Level Labs: ?No results found for: LITHIUM ?No results found for: VALPROATE ?No components found for:  CBMZ ? ?.meds ? ?Current Medications: ?Current Outpatient Medications  ?Medication Sig Dispense Refill  ? acetaminophen (TYLENOL) 500 MG tablet Take 1,000 mg by mouth every 6 (six) hours as needed for moderate pain or headache.    ? Continuous Blood Gluc Receiver (DEXCOM G6 RECEIVER) DEVI 1 each by Continuous infusion (non-IV) route See admin instructions. Use as directed for continuous blood glucose monitoring.    ? Continuous Blood Gluc Sensor (DEXCOM G6 SENSOR) MISC 1 each by Other route See admin instructions. Every 10 days.    ? Continuous Blood Gluc Transmit (DEXCOM G6 TRANSMITTER) MISC 1 each by Other route See admin instructions. Use as directed for continuous glucose monitoring. Use Transmitter for 90 days. Discard and replace.    ? hydrOXYzine (ATARAX/VISTARIL) 10 MG tablet Take 1 tablet (10 mg total) by mouth 3 (three) times daily as needed for anxiety. 90 tablet 2  ? insulin aspart protamine - aspart (NOVOLOG 70/30 MIX) (70-30) 100 UNIT/ML FlexPen Inject 24-28 Units into the skin See admin instructions. Administer 28 units at breakfast and 24 units with dinner.    ? paliperidone (INVEGA SUSTENNA) 156 MG/ML SUSY injection Inject 1 mL (156 mg total) into the muscle every 28 (twenty-eight) days. Every 28 days (Due on 07-11-20): For mood control 1 mL 11  ? traZODone (DESYREL) 100 MG tablet Take 1 tablet (100 mg total) by mouth at bedtime as needed for sleep. 30 tablet 2  ? ?Current Facility-Administered Medications  ?Medication Dose Route Frequency Provider Last Rate Last Admin  ? paliperidone (INVEGA SUSTENNA) injection 156 mg  156 mg Intramuscular Q28 days Toy Cookey E, NP   156 mg at 07/30/21 1321  ? ? ? ?Musculoskeletal: ?Strength & Muscle Tone: within normal limits ?Gait & Station: normal ?Patient leans: N/A ? ?Psychiatric Specialty Exam: ?Review of Systems  ?Constitutional: Negative.   ?HENT:  Negative.    ?Eyes: Negative.   ?Respiratory: Negative.    ?Cardiovascular: Negative.   ?Gastrointestinal: Negative.   ?Genitourinary: Negative.   ?Musculoskeletal: Negative.   ?Skin: Negative.   ?Neurological: Negative.   ?Hematological: Negative.   ?Psychiatric/Behavioral:  Negative for agitation, behavioral problems, decreased concentration, dysphoric mood, hallucinations and suicidal ideas. The patient is not nervous/anxious and is not hyperactive.    ?There were no vitals taken for this visit.There is no height or weight on file to calculate BMI.  ?General Appearance: Casual and Neat  ?Eye Contact:  Good  ?Speech:  Clear and Coherent and Normal Rate  ?Volume:  Normal  ?Mood:  Euthymic  ?Affect:  Appropriate and Congruent  ?Thought Process:  Coherent, Goal Directed, and Descriptions of Associations: Intact  ?Orientation:  Full (Time,  Place, and Person)  ?Thought Content: WDL and Logical   ?Suicidal Thoughts:  No  ?Homicidal Thoughts:  No  ?Memory:  Immediate;   Good ?Recent;   Good ?Remote;   Good  ?Judgement:  Intact  ?Insight:  Good and Present  ?Psychomotor Activity:  Normal  ?Concentration:  Concentration: Good and Attention Span: Good  ?Recall:  Good  ?Fund of Knowledge: Good  ?Language: Good  ?Akathisia:  No  ?Handed:  Left  ?AIMS (if indicated): done  ?Assets:  Communication Skills ?Desire for Improvement ?Financial Resources/Insurance ?Housing ?Social Support  ?ADL's:  Intact  ?Cognition: WNL  ?Sleep:  Good  ? ?Screenings: ?AIMS   ? ?Flowsheet Row Office Visit from 01/28/2022 in [facility] Office Visit from 07/30/2021 in [facility] Ad[facility] (Dis[facility]) [facility] from OP [facility]  ?AIMS Total Score 0 0 0 0 0  ? ?  ? ?AUDIT    ? ?Flowsheet Row [facility] Ad[facility] (Discharged) from OP [facility] Ad

## 2022-02-02 ENCOUNTER — Encounter (HOSPITAL_COMMUNITY): Payer: Self-pay

## 2022-02-02 ENCOUNTER — Ambulatory Visit (INDEPENDENT_AMBULATORY_CARE_PROVIDER_SITE_OTHER): Payer: Medicaid Other | Admitting: *Deleted

## 2022-02-02 ENCOUNTER — Other Ambulatory Visit (HOSPITAL_COMMUNITY): Payer: Self-pay | Admitting: Family

## 2022-02-02 VITALS — BP 140/69 | HR 83 | Ht 69.0 in | Wt 158.0 lb

## 2022-02-02 DIAGNOSIS — F251 Schizoaffective disorder, depressive type: Secondary | ICD-10-CM | POA: Diagnosis not present

## 2022-02-02 DIAGNOSIS — F411 Generalized anxiety disorder: Secondary | ICD-10-CM

## 2022-02-02 MED ORDER — PALIPERIDONE PALMITATE ER 546 MG/1.75ML IM SUSY: 546.0000 mg | PREFILLED_SYRINGE | INTRAMUSCULAR | Status: DC

## 2022-02-02 MED ORDER — PALIPERIDONE PALMITATE ER 156 MG/ML IM SUSY
156.0000 mg | PREFILLED_SYRINGE | Freq: Once | INTRAMUSCULAR | Status: AC
  Administered 2022-02-02: 156 mg via INTRAMUSCULAR

## 2022-02-02 NOTE — Progress Notes (Signed)
In a week after being seen last week with his grandma by the NP for his 3 month assessment. He brought his shot today, he had forgotten it last week which was the reason he was not given a shot last week. He has MCD and brings his meds from his pharmacy. Due to the expense of the Trinza injection it is not something we have samples of or would ever have samples of. Today he brought from his pharmacy Invega S 156 mg, his old dose. Gave him this shot as it is all he has and will change back to the trinza when its available to him from his pharmacy. There is a current order in for it and will call them and follow up on why he didn't have the [place] today. He got his 156 mg injection of Invega S in his L DELTOID without issue. He is in good spirits today and offered no complaints. He is going for a job interview today, again we discussed a part time position rather than full time but he says he needs the money. This is an interview at [employer]. To return in one month.  ?

## 2022-02-02 NOTE — Telephone Encounter (Cosign Needed)
Patient requests that Paliperidone Invega Trinza 561.6 IM Q 3 months be refilled.  Medication electronically prescribed to preferred pharmacy by this writer.  ?

## 2022-02-03 ENCOUNTER — Encounter: Payer: Self-pay | Admitting: Internal Medicine

## 2022-02-03 ENCOUNTER — Ambulatory Visit: Payer: Medicaid Other | Admitting: Internal Medicine

## 2022-02-03 ENCOUNTER — Other Ambulatory Visit: Payer: Self-pay

## 2022-02-03 DIAGNOSIS — E109 Type 1 diabetes mellitus without complications: Secondary | ICD-10-CM

## 2022-02-03 MED ORDER — NOVOLOG FLEXPEN 100 UNIT/ML ~~LOC~~ SOPN: PEN_INJECTOR | SUBCUTANEOUS | 2 refills | Status: DC

## 2022-02-03 MED ORDER — INSULIN DEGLUDEC 100 UNIT/ML ~~LOC~~ SOPN: 25.0000 [IU] | PEN_INJECTOR | Freq: Every day | SUBCUTANEOUS | 0 refills | Status: DC

## 2022-02-03 NOTE — Patient Instructions (Signed)
Thank you, Mr.Kaliel Humber for allowing [place] to provide your care today. Today we discussed Diabetes.   ? ?Labs/Tests Ordered: ?Lab Orders  ?No laboratory test(s) ordered today  ?  ? ?Referrals Ordered:  ?Referral Orders  ?No referral(s) requested today  ?  ? ?Medication Changes:  ?Medications Discontinued During This Encounter  ?Medication Reason  ? insulin degludec (TRESIBA) 100 UNIT/ML FlexTouch Pen   ? insulin aspart (NOVOLOG FLEXPEN) 100 UNIT/ML FlexPen   ?  ? ?Meds ordered this encounter  ?Medications  ? insulin aspart (NOVOLOG FLEXPEN) 100 UNIT/ML FlexPen  ?  Sig: Inject 6 Units into the skin daily with lunch AND 6 Units daily with supper.  ?  Dispense:  15 mL  ?  Refill:  2  ? insulin degludec (TRESIBA) 100 UNIT/ML FlexTouch Pen  ?  Sig: Inject 25 Units into the skin daily.  ?  Dispense:  14 mL  ?  Refill:  0  ?  ? ?Health Maintenance Screening: ?Diabetes Health Maintenance Due  ?Topic Date Due  ? OPHTHALMOLOGY EXAM  Never done  ? HEMOGLOBIN A1C  03/01/2022  ? URINE MICROALBUMIN  12/02/2022  ? FOOT EXAM  12/23/2022  ?  ? ?Instructions:  ?- Please take 6 units of novolog immediately with Lunch and Dinner ?- Please stop taking novolog with breakfast ?- Increase tresiba 25 units daily ?- Please try to eat consistent meals throughout the day  ?- Please eat immediately after taking your novolog. ? ?Follow up:  1 month for diabetes check   ? ?Remember: If you have any questions or concerns, call our clinic at [phone] or after hours call [phone] and ask for the internal medicine resident on call. ? ?Dellia Cloud, D.O. ?[facility] ? ? ? ?

## 2022-02-03 NOTE — Progress Notes (Signed)
? ? ?Subjective:  ?CC: DM ? ?HPI: ? ?[name] is a 31 y.o. male with a past medical history stated below and presents today for DM. Please see problem based assessment and plan for additional details. ? ?Past Medical History:  ?Diagnosis Date  ? Bipolar 1 disorder (HCC)   ? History of attempted suicide 2019  ? Unsuccessful suicide attempt in 2019 with rat poison  ? Schizoaffective disorder (HCC)   ? Type 1 diabetes mellitus on insulin therapy (HCC) 2020  ? ? ?Current Outpatient Medications on File Prior to Visit  ?Medication Sig Dispense Refill  ? acetaminophen (TYLENOL) 500 MG tablet Take 1,000 mg by mouth every 6 (six) hours as needed for moderate pain or headache.    ? Continuous Blood Gluc Receiver (DEXCOM G6 RECEIVER) DEVI 1 each by Continuous infusion (non-IV) route See admin instructions. Use as directed for continuous blood glucose monitoring.    ? Continuous Blood Gluc Sensor (DEXCOM G6 SENSOR) MISC Replace every 10 days. Use to monitor blood sugar continuously 9 each 3  ? Continuous Blood Gluc Transmit (DEXCOM G6 TRANSMITTER) MISC Use as directed for continuous glucose monitoring. Use Transmitter for 90 days. Discard and replace. 1 each 3  ? hydrOXYzine (ATARAX/VISTARIL) 10 MG tablet Take 1 tablet (10 mg total) by mouth 3 (three) times daily as needed for anxiety. 90 tablet 2  ? Insulin Pen Needle 32G X 4 MM MISC Use to inject insulin 4 times a day 360 each 3  ? traZODone (DESYREL) 100 MG tablet Take 1 tablet (100 mg total) by mouth at bedtime as needed for sleep. 30 tablet 2  ? ?Current Facility-Administered Medications on File Prior to Visit  ?Medication Dose Route Frequency Provider Last Rate Last Admin  ? Paliperidone Palmitate ER SUSY 561 mg  561 mg Intramuscular Q90 days Rankin, Shuvon B, NP      ? [START ON 05/04/2022] Paliperidone Palmitate ER SUSY 561.6 mg  561.6 mg Intramuscular Q90 days Lenard Lance, FNP      ? ? ?Family History  ?Problem Relation Age of Onset  ? Healthy Mother   ?  Healthy Father   ? ? ?Social History  ? ?Socioeconomic History  ? Marital status: Single  ?  Spouse name: Not on file  ? Number of children: Not on file  ? Years of education: Not on file  ? Highest education level: Not on file  ?Occupational History  ? Not on file  ?Tobacco Use  ? Smoking status: Some Days  ?  Types: Cigarettes  ? Smokeless tobacco: Never  ? Tobacco comments:  ?  2 BLACK AND MILD A DAY  ?Vaping Use  ? Vaping Use: Never used  ?Substance and Sexual Activity  ? Alcohol use: No  ? Drug use: Yes  ?  Types: Marijuana  ? Sexual activity: Not on file  ?Other Topics Concern  ? Not on file  ?Social History Narrative  ? Not on file  ? ?Social Determinants of Health  ? ?Financial Resource Strain: Not on file  ?Food Insecurity: Not on file  ?Transportation Needs: Not on file  ?Physical Activity: Not on file  ?Stress: Not on file  ?Social Connections: Not on file  ?Intimate Partner Violence: Not on file  ? ? ?Review of Systems: ?ROS negative except for what is noted on the assessment and plan. ? ?Objective:  ? ?Vitals:  ? 02/03/22 1113  ?BP: 113/75  ?Pulse: 78  ?Temp: 98.3 ?F (36.8 ?C)  ?TempSrc: Oral  ?  SpO2: 100%  ?Weight: 161 lb 9.6 oz (73.3 kg)  ?Height: 5\' 9"  (1.753 m)  ? ? ?Physical Exam: ?Gen: A&O x3 and in no apparent distress, well appearing and nourished. ? ?CV: RRR, no murmurs, S1/S2 presents  ?Resp: Clear to ascultation bilaterally  ?Abd: BS (+) x4, soft, non-tender abdomen, without hepatosplenomegaly or masses ?MSK: Grossly normal AROM and strength x4 extremities. ?Neuro: No focal deficits, grossly normal sensation and coordination.  ?Psych: Oriented x3 and responding appropriately. Intact memory, normal mood, judgement, affect, and insight. Slightly restless ? ? ?Assessment & Plan:  ?See Encounters Tab for problem based charting. ? ?Patient discussed with Dr. ? ? ?Mayford Knife, D.O. ?[facility]  PGY-3 ?Pager: [phone]  Phone: [phone] ?Date 02/04/2022  Time 2:41  PM  ?

## 2022-02-04 ENCOUNTER — Encounter: Payer: Self-pay | Admitting: Internal Medicine

## 2022-02-04 NOTE — Assessment & Plan Note (Signed)
>>  ASSESSMENT AND PLAN FOR UNCONTROLLED TYPE 1 DIABETES MELLITUS WITH HYPOGLYCEMIA WITHOUT COMA (HCC) WRITTEN ON 02/04/2022  2:41 PM BY COE, BENJAMIN, MD  Presents for follow up for DM. He was recently seen in the [facility] clinic and found to have some low blood sugars. He was decreased to 4 units novolog  TID. Patient continues ot have some low CBGs mixed with some high CBGs. He states that he eats three meals a day but has significant variability in the types of food and the times that he eats each meal, particularly at dinner. Additionally, he states that he would wait at least 30 minutes or more between taking his meal time insulin  and eating meals. He states that he tolerates the injections well.  He denies symptoms with his low blood sugars.  On evaluation of his glucometer, He has a 37% of the CBGs in the very high range, 28% in the high range, and 33% in range, and 4% in the low to very low range. He eats breakfast at 9am, lunch around 1pm, and and dinner between 6-9 pm. He has a wide variety of different type of food, many of which are high carb. His lowest CBG readings are in the morning and his highest CBGs are in the afternoon/evenings. This fits with his eating pattern with more calories being taken in the afternoon and more variation in his eating time around dinner. Additionally, he has 3 focal dips in his CBGs after each meal time which correlates with the latency of food intake after taking his insulin .      Assessment/Plan: Insulin -dependant DM, poorly controlled 2/2 to antipsychotic medications side effects combined with  inconsistent eating habits, food selection, and incorrect short-acting insulin  administration. - D/c morning novolog  injection - Increase lunch/dinner novolog  to 6 units with meals - Increase Tresiba  to 25 units daily - Counseled patient to take insulin  when food is directly in front of him - Counseled patient to try to eat more consistent meals and decrease carb  intake when possible  - Counseled him regarding hypoglycemic prevention and return precautions based of CBG reading and symptoms  - Follow up in 1 month to adjust insulin .

## 2022-02-04 NOTE — Assessment & Plan Note (Addendum)
Presents for follow up for DM. He was recently seen in the [facility] clinic and found to have some low blood sugars. He was decreased to 4 units novolog TID. Patient continues ot have some low CBGs mixed with some high CBGs. He states that he eats three meals a day but has significant variability in the types of food and the times that he eats each meal, particularly at dinner. Additionally, he states that he would wait at least 30 minutes or more between taking his meal time insulin and eating meals. He states that he tolerates the injections well.  He denies symptoms with his low blood sugars. ? ?On evaluation of his glucometer, He has a 37% of the CBGs in the "very high" range, 28% in the "high" range, and 33% "in range", and 4% in the "low" to "very low" range. He eats breakfast at 9am, lunch around 1pm, and and dinner between 6-9 pm. He has a wide variety of different type of food, many of which are high carb. His lowest CBG readings are in the morning and his highest CBGs are in the afternoon/evenings. This fits with his eating pattern with more calories being taken in the afternoon and more variation in his eating time around dinner. Additionally, he has 3 focal dips in his CBGs after each meal time which correlates with the latency of food intake after taking his insulin.   ? ?? ? ?Assessment/Plan: ?Insulin-dependant DM, poorly controlled 2/2 to antipsychotic medications side effects combined with  inconsistent eating habits, food selection, and incorrect short-acting insulin administration. ?- D/c morning novolog injection ?- Increase lunch/dinner novolog to 6 units with meals ?- Increase Tresiba to 25 units daily ?- Counseled patient to take insulin when food is directly in front of him ?- Counseled patient to try to eat more consistent meals and decrease carb intake when possible  ?- Counseled him regarding hypoglycemic prevention and return precautions based of CBG reading and symptoms  ?- Follow up in 1 month  to adjust insulin.  ? ?

## 2022-02-09 NOTE — Progress Notes (Signed)
Internal Medicine Clinic Attending  Case discussed with Dr. Coe  At the time of the visit.  We reviewed the resident's history and exam and pertinent patient test results.  I agree with the assessment, diagnosis, and plan of care documented in the resident's note.  

## 2022-02-17 ENCOUNTER — Other Ambulatory Visit (INDEPENDENT_AMBULATORY_CARE_PROVIDER_SITE_OTHER): Payer: Medicaid Other | Admitting: Student

## 2022-02-17 DIAGNOSIS — F250 Schizoaffective disorder, bipolar type: Secondary | ICD-10-CM

## 2022-02-17 DIAGNOSIS — E109 Type 1 diabetes mellitus without complications: Secondary | ICD-10-CM

## 2022-02-23 MED ORDER — INVEGA TRINZA 546 MG/1.75ML IM SUSY: 546.0000 mg | PREFILLED_SYRINGE | INTRAMUSCULAR | 0 refills | Status: DC

## 2022-02-23 MED ORDER — PALIPERIDONE PALMITATE ER 546 MG/1.75ML IM SUSY
546.0000 mg | PREFILLED_SYRINGE | INTRAMUSCULAR | Status: DC
  Administered 2022-03-04: 561.6 mg via INTRAMUSCULAR

## 2022-02-23 NOTE — Telephone Encounter (Cosign Needed)
Patient's grandmother, Philmore Lepore, requests medication refill for Paliperidone Palmitate ER 561mg  IM Q 3 months. Medication refill provided by this .  ?

## 2022-03-03 ENCOUNTER — Encounter: Payer: Self-pay | Admitting: Internal Medicine

## 2022-03-03 ENCOUNTER — Other Ambulatory Visit: Payer: Self-pay

## 2022-03-03 ENCOUNTER — Encounter: Payer: Medicaid Other | Admitting: Internal Medicine

## 2022-03-03 ENCOUNTER — Ambulatory Visit (INDEPENDENT_AMBULATORY_CARE_PROVIDER_SITE_OTHER): Payer: Medicaid Other | Admitting: Internal Medicine

## 2022-03-03 VITALS — BP 121/76 | HR 70 | Temp 98.0°F | Ht 68.0 in | Wt 157.3 lb

## 2022-03-03 DIAGNOSIS — E109 Type 1 diabetes mellitus without complications: Secondary | ICD-10-CM | POA: Diagnosis not present

## 2022-03-03 LAB — POCT GLYCOSYLATED HEMOGLOBIN (HGB A1C): Hemoglobin A1C: 9.9 % — AB (ref 4.0–5.6)

## 2022-03-03 LAB — GLUCOSE, CAPILLARY: Glucose-Capillary: 272 mg/dL — ABNORMAL HIGH (ref 70–99)

## 2022-03-03 MED ORDER — NOVOLOG FLEXPEN 100 UNIT/ML ~~LOC~~ SOPN: PEN_INJECTOR | SUBCUTANEOUS | 2 refills | Status: DC

## 2022-03-03 MED ORDER — INSULIN DEGLUDEC 100 UNIT/ML ~~LOC~~ SOPN: 27.0000 [IU] | PEN_INJECTOR | Freq: Every day | SUBCUTANEOUS | 0 refills | Status: DC

## 2022-03-03 NOTE — Progress Notes (Signed)
? ?  CC: Type I Diabetes F/U ? ?HPI: ? ?[name] is a 31 y.o. person, with a PMH noted below, who presents to the clinic diabetes follow-up. To see the management of their acute and chronic conditions, please see the A&P note under the Encounters tab.  ? ?Past Medical History:  ?Diagnosis Date  ? Bipolar 1 disorder ([place])   ? History of attempted suicide 2019  ? Unsuccessful suicide attempt in 2019 with rat poison  ? Schizoaffective disorder ([place])   ? Type 1 diabetes mellitus on insulin therapy ([place]) 2020  ? ?Review of Systems:   ?Review of Systems  ?Constitutional:  Negative for chills, fever and malaise/fatigue.  ?Eyes:  Negative for blurred vision, double vision and photophobia.  ?Gastrointestinal:  Negative for abdominal pain, diarrhea, nausea and vomiting.  ?Genitourinary:  Negative for frequency.   ? ?Physical Exam: ? ?Vitals:  ? 03/03/22 0846  ?BP: 121/76  ?Pulse: 70  ?Temp: 98 ?F (36.7 ?C)  ?TempSrc: Oral  ?SpO2: 100%  ?Weight: 157 lb 4.8 oz (71.4 kg)  ?Height: 5\' 8"  (1.727 m)  ? ?Physical Exam ?Constitutional:   ?   General: He is not in acute distress. ?   Appearance: He is normal weight. He is not ill-appearing, toxic-appearing or diaphoretic.  ?HENT:  ?   Head: Normocephalic and atraumatic.  ?Cardiovascular:  ?   Rate and Rhythm: Normal rate and regular rhythm.  ?   Pulses: Normal pulses.  ?   Heart sounds: Normal heart sounds.  ?Pulmonary:  ?   Effort: No respiratory distress.  ?   Breath sounds: Normal breath sounds.  ?Skin: ?   General: Skin is warm.  ?   Findings: No lesion or rash.  ?Neurological:  ?   Mental Status: He is alert and oriented to person, place, and time.  ?Psychiatric:     ?   Mood and Affect: Mood normal.     ?   Behavior: Behavior normal.  ?  ? ?Assessment & Plan:  ? ?See Encounters Tab for problem based charting. ? ?Patient discussed with Dr. Evette Doffing  ?

## 2022-03-03 NOTE — Assessment & Plan Note (Addendum)
Patient patient's for 1 month follow-up for his uncontrolled diabetes. During his last visit, his [name] was increased to 25 units, his morning NovoLog was discontinued, and his lunch and dinnertime NovoLog was increased to 6 units with each meal.  He is seen alongside his family today, he states that he is not taking the diet or his illness seriously. He will sometimes not take his insulin if his sugars seem low (in the 130s to 150s), he will also indulge in sugary snacks. They have seen [name] in our clinic, and his family tries to cook the meals that have been listed and prepare healthy food, but the patient continues to snack and eat late at night. The patient states that he is trying to work on his dietary indiscretion, but does struggle with eating chips and sugary snacks. ? ?On inspection of his glucometer, 55% of his CBGs are in the very high range, 23% 9 range, 18% are in range, 3% are low and less than 1% is very low with an average glucose level of 257. He typically eats breakfast around 8 to 9 AM, lunch around 1 to 2 PM, and dinner varies between 6 through 8 PM.  His higher sugars are in the afternoons typically range over 300 and Out at 400, his lowest sugars are in the mornings and are typically in the 250s. ? ?A/P:  ?Today we discussed straps continued uncontrolled type 1 diabetes.  His A1c today is 9.9 from 10.1% in March.  We discussed the long-term risks involved with uncontrolled diabetes in detail today.  We additionally discussed consistent daily eating habits, and to limit our carbohydrate intake. Given that his sugars continue to run above 200s, with afternoon and evening CBGs in the 300s to 400s, we will increase his regimen.  Given his uncontrolled diabetes and second-generation antipsychotic, will additionally assess for hyperlipidemia today with the added we discussed meeting with [name] in 2 weeks time, but family has difficulty coming in frequently to the clinic.  We will follow-up at the  clinic in 1 month's time. ?- Increase Tresiba to 27 units ?- Increase NovoLog to 8 units with lunch and dinner ?- A1c today ?- Follow-up in 1 month ?- Lipid panel today ?- Ophthalmology referral for diabetic eye exam ?

## 2022-03-03 NOTE — Patient Instructions (Addendum)
To Mr. Worrell,  ? ?It was a pleasure seeing you this morning! Today we discussed your diabetes medications. Given that you are continuing to have high blood sugars, we will increase your long and short acting insulins. For your [name] (long acting, morning insulin) we will increase it to 27 units. For your short acting (Novolog, mealtime) we will increase it to 8 units with lunch and dinner. We will have you come back in one month for reevaluation. I have also discussed with [name], and she would like to see you in 2 weeks time as well.  ? ?Have a good day, and we will see you in one month! ?Dolan Amen, MD ?

## 2022-03-03 NOTE — Assessment & Plan Note (Signed)
>>  ASSESSMENT AND PLAN FOR UNCONTROLLED TYPE 1 DIABETES MELLITUS WITH HYPOGLYCEMIA WITHOUT COMA (HCC) WRITTEN ON 03/03/2022 10:16 AM BY WINTERS, STEVEN, MD  Patient patient's for 1 month follow-up for his uncontrolled diabetes. During his last visit, his Tresiba  was increased to 25 units, his morning NovoLog  was discontinued, and his lunch and dinnertime NovoLog  was increased to 6 units with each meal.  He is seen alongside his family today, he states that he is not taking the diet or his illness seriously. He will sometimes not take his insulin  if his sugars seem low (in the 130s to 150s), he will also indulge in sugary snacks. They have seen [name] in our clinic, and his family tries to cook the meals that have been listed and prepare healthy food, but the patient continues to snack and eat late at night. The patient states that he is trying to work on his dietary indiscretion, but does struggle with eating chips and sugary snacks.  On inspection of his glucometer, 55% of his CBGs are in the very high range, 23% 9 range, 18% are in range, 3% are low and less than 1% is very low with an average glucose level of 257. He typically eats breakfast around 8 to 9 AM, lunch around 1 to 2 PM, and dinner varies between 6 through 8 PM.  His higher sugars are in the afternoons typically range over 300 and Out at 400, his lowest sugars are in the mornings and are typically in the 250s.  A/P:  Today we discussed straps continued uncontrolled type 1 diabetes.  His A1c today is 9.9 from 10.1% in March.  We discussed the long-term risks involved with uncontrolled diabetes in detail today.  We additionally discussed consistent daily eating habits, and to limit our carbohydrate intake. Given that his sugars continue to run above 200s, with afternoon and evening CBGs in the 300s to 400s, we will increase his regimen.  Given his uncontrolled diabetes and second-generation antipsychotic, will additionally assess for hyperlipidemia  today with the added we discussed meeting with [name] in 2 weeks time, but family has difficulty coming in frequently to the clinic.  We will follow-up at the clinic in 1 month's time. - Increase Tresiba  to 27 units - Increase NovoLog  to 8 units with lunch and dinner - A1c today - Follow-up in 1 month - Lipid panel today - Ophthalmology referral for diabetic eye exam

## 2022-03-04 ENCOUNTER — Encounter (HOSPITAL_COMMUNITY): Payer: Self-pay

## 2022-03-04 ENCOUNTER — Ambulatory Visit (INDEPENDENT_AMBULATORY_CARE_PROVIDER_SITE_OTHER): Payer: Medicaid Other | Admitting: *Deleted

## 2022-03-04 VITALS — BP 141/84 | HR 82 | Ht 68.0 in | Wt 155.0 lb

## 2022-03-04 DIAGNOSIS — F251 Schizoaffective disorder, depressive type: Secondary | ICD-10-CM | POA: Diagnosis not present

## 2022-03-04 DIAGNOSIS — F250 Schizoaffective disorder, bipolar type: Secondary | ICD-10-CM | POA: Diagnosis not present

## 2022-03-04 DIAGNOSIS — F411 Generalized anxiety disorder: Secondary | ICD-10-CM

## 2022-03-04 LAB — LIPID PANEL
Chol/HDL Ratio: 3 ratio (ref 0.0–5.0)
Cholesterol, Total: 202 mg/dL — ABNORMAL HIGH (ref 100–199)
HDL: 67 mg/dL (ref >39)
LDL Chol Calc (NIH): 125 mg/dL — ABNORMAL HIGH (ref 0–99)
Triglycerides: 54 mg/dL (ref 0–149)
VLDL Cholesterol Cal: 10 mg/dL (ref 5–40)

## 2022-03-04 MED ORDER — HYDROXYZINE HCL 10 MG PO TABS: 10.0000 mg | ORAL_TABLET | Freq: Three times a day (TID) | ORAL | 2 refills | Status: DC | PRN

## 2022-03-04 MED ORDER — INVEGA TRINZA 546 MG/1.75ML IM SUSY: 546.0000 mg | PREFILLED_SYRINGE | INTRAMUSCULAR | 1 refills | Status: DC

## 2022-03-04 MED ORDER — TRAZODONE HCL 100 MG PO TABS: 100.0000 mg | ORAL_TABLET | Freq: Every evening | ORAL | 2 refills | Status: DC | PRN

## 2022-03-04 NOTE — Addendum Note (Signed)
Addended by: Assunta Found B on: 03/04/2022 01:10 PM ? ? Modules accepted: Orders ? ?

## 2022-03-04 NOTE — Progress Notes (Signed)
In as scheduled for his injection of Invega Trinza 546 mg given today in his L DELTOID at his request due to his glucose monitor in his R arm. I checked with our pharmacist and going forward it is fine per Terri to administer his injection in the arm with the monitor. 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 He is to return in 3 months for his next shot. His mom brought him to his appt today but stayed outside in the car.  ?

## 2022-03-05 NOTE — Progress Notes (Signed)
Internal Medicine Clinic Attending ? ?Case discussed with Dr. Winters  At the time of the visit.  We reviewed the resident?s history and exam and pertinent patient test results.  I agree with the assessment, diagnosis, and plan of care documented in the resident?s note.  ?

## 2022-03-31 ENCOUNTER — Encounter: Payer: Medicaid Other | Admitting: Internal Medicine

## 2022-04-06 ENCOUNTER — Encounter: Payer: Medicaid Other | Admitting: Student

## 2022-04-13 ENCOUNTER — Telehealth: Payer: Self-pay

## 2022-04-13 NOTE — Telephone Encounter (Signed)
Pa for pt ( DEXCOM G 6 TRANSMITTER ) came through via fax from pharmacy was submitted to   [facility]       [place] [facility] will send the approval or denial letter to  the patient  home and the pharmacy .Marland Kitchen With in a few days or a week I no longer have control

## 2022-04-14 IMAGING — MG DIGITAL DIAGNOSTIC BILAT W/ TOMO W/ CAD
6 of 12 series · 6 of 36 positions shown · non-contrast
Comparison: None.

CLINICAL DATA: Palpable abnormality in the RIGHT breast.

EXAM:
DIGITAL DIAGNOSTIC BILATERAL MAMMOGRAM WITH TOMOSYNTHESIS AND CAD;
ULTRASOUND RIGHT BREAST LIMITED
TECHNIQUE: Bilateral digital diagnostic mammography and breast tomosynthesis
was performed. The images were evaluated with computer-aided
detection.; Targeted ultrasound examination of the right breast was
performed

[L CC synth-2D]
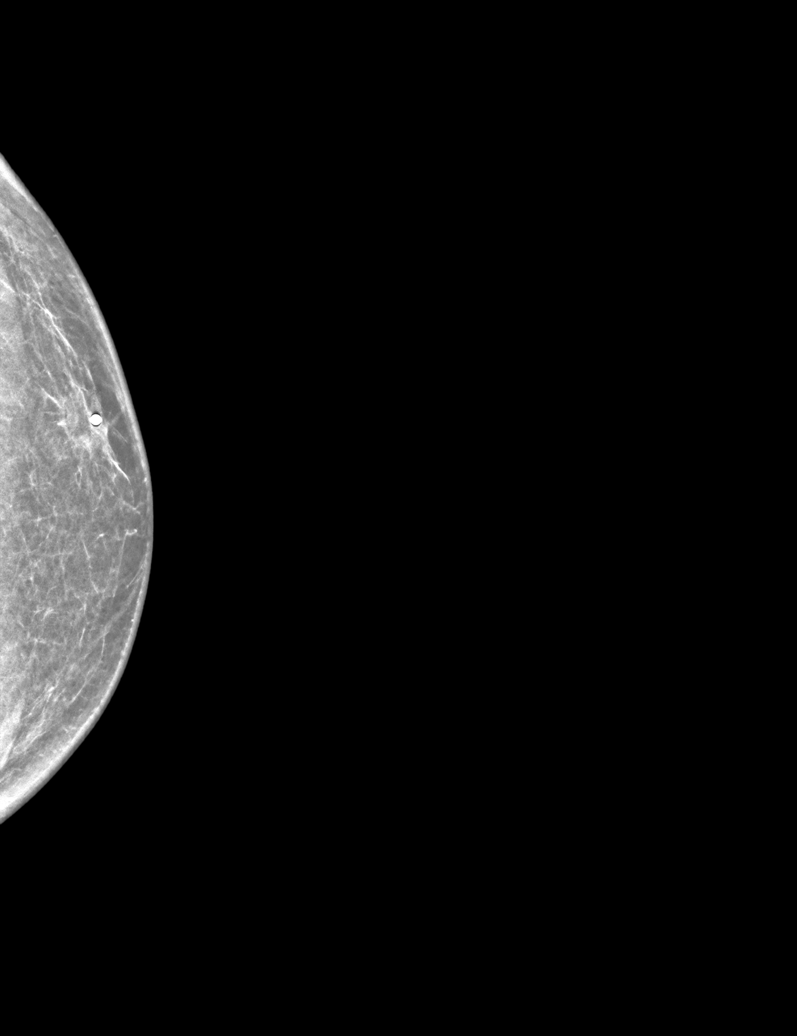

[R CC synth-2D]
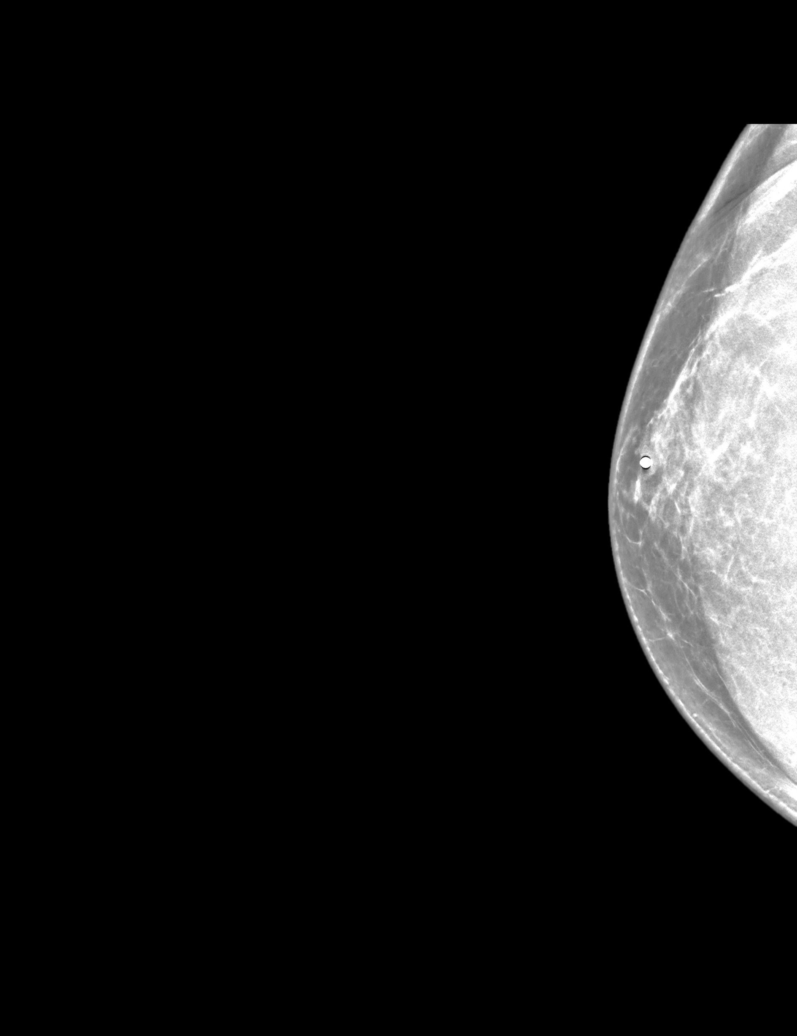

[R MLO synth-2D]
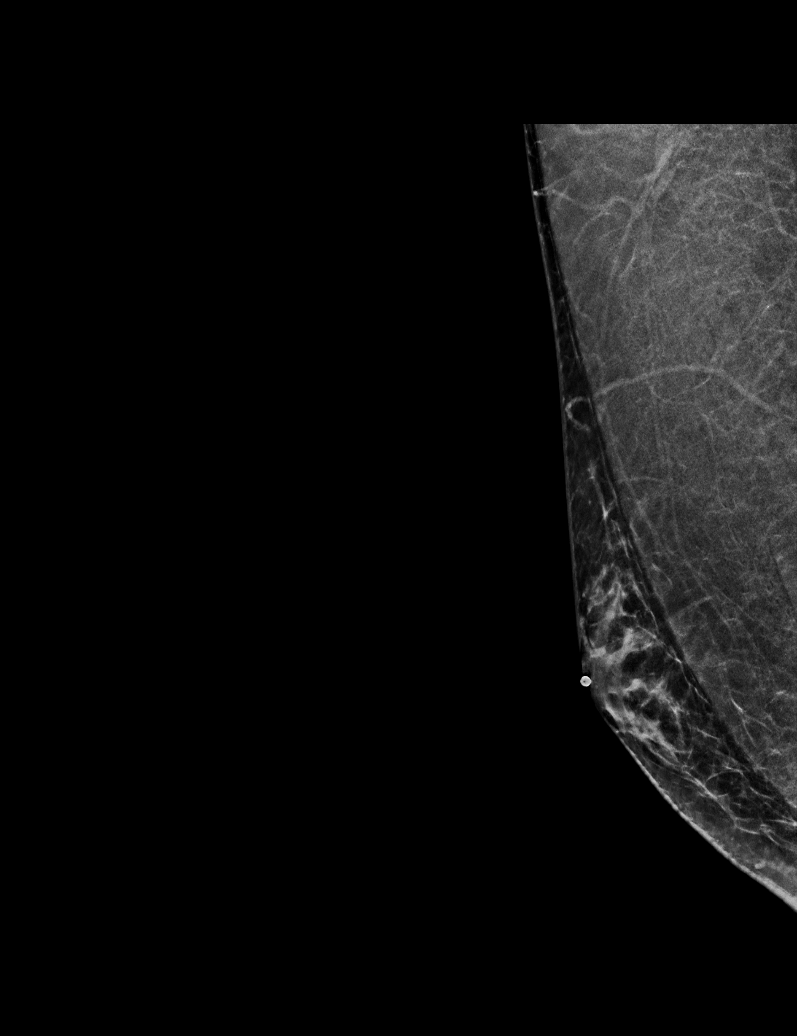

[R TAN synth-2D]
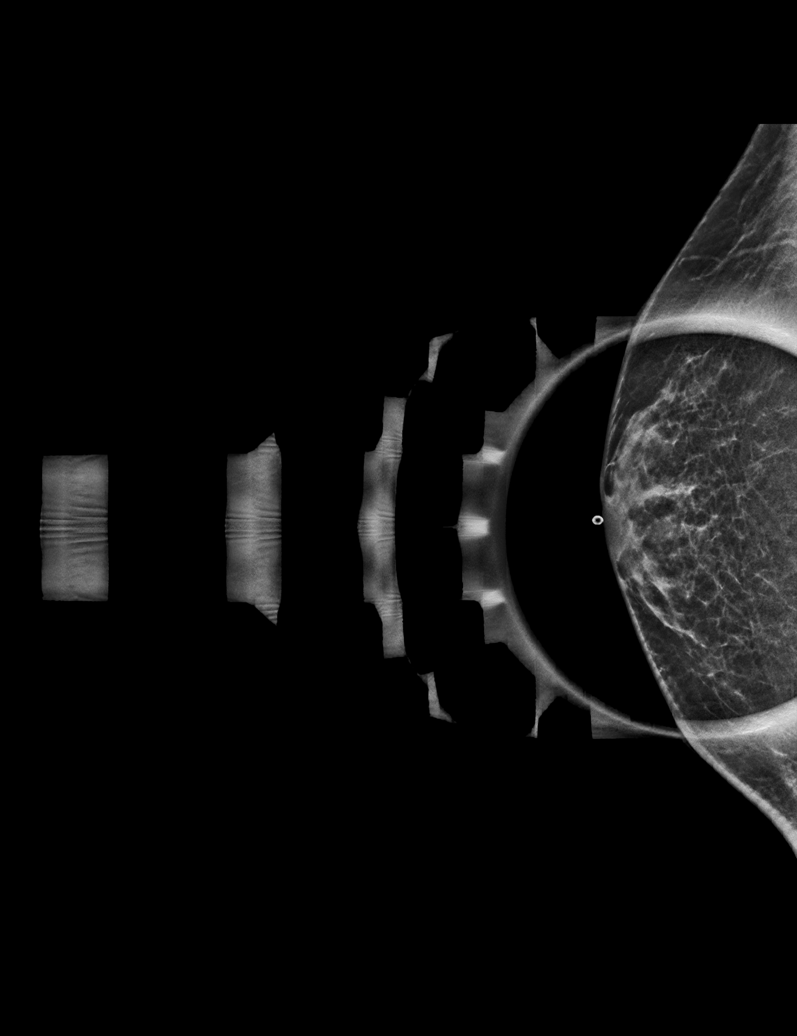

[L MLO synth-2D (1 of 2)]
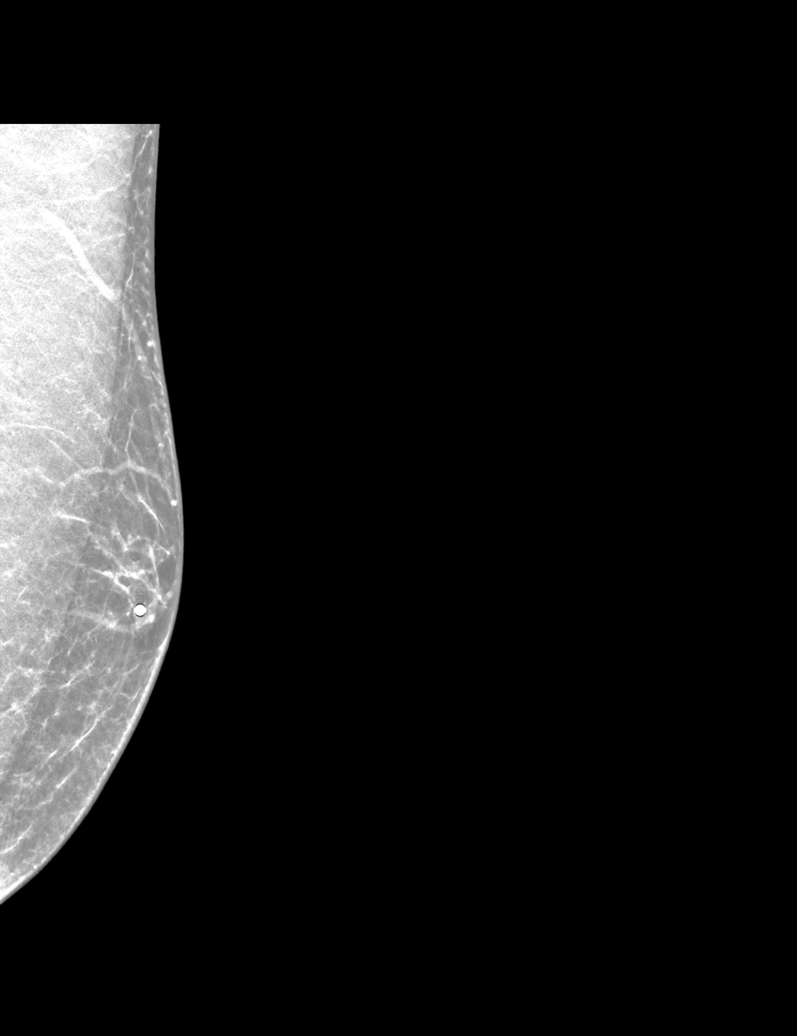

[L MLO synth-2D (2 of 2)]
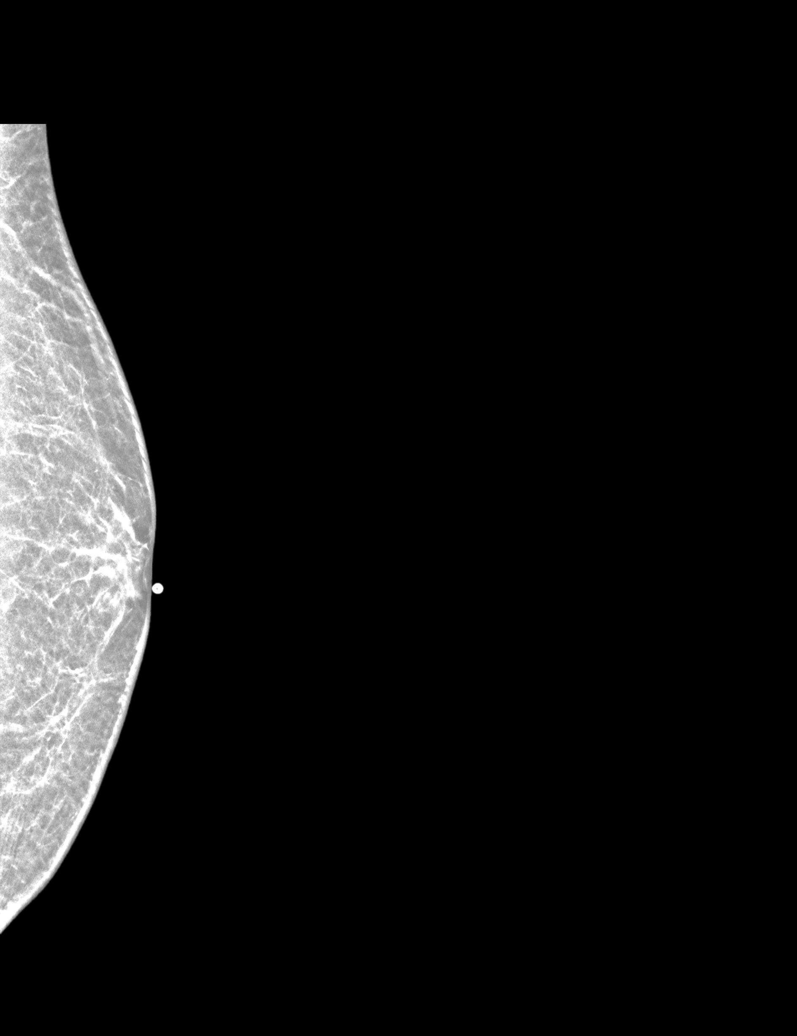

[6 of 36 positions shown; findings below may reference images not displayed]

ACR Breast Density Category b: There are scattered areas of
fibroglandular density.
FINDINGS: Small amount of breast tissue is identified in the retroareolar
regions bilaterally, RIGHT greater than LEFT. No suspicious mass,
distortion, or microcalcifications are identified to suggest
presence of malignancy.

On physical exam, I palpate soft mildly tender thickening in the
retroareolar region of the RIGHT breast, slightly asymmetric
compared to the LEFT. I palpate no discrete mass.

Targeted ultrasound is performed, showing normal appearing
fibroglandular tissue in the immediate retroareolar region of the
RIGHT breast, not associated with mass or acoustic shadowing.
IMPRESSION: Benign minimal gynecomastia bilaterally, RIGHT greater than LEFT.

Gynecomastia is common, and can occur with changes in the
testosterone:estrogen ratio. Potential causes include numerous
prescription medications and recreational drugs (marijuana and
anabolic steroids in particular). Twenty per cent of cases are
idiopathic. Rarely, gynecomastia can be related to testicular
tumors. Gynecomastia does not increase the risk for breast cancer.

RECOMMENDATION:
Consider correlation with testicular exam and possible testicular
ultrasound. Consider evaluation of serum estradiol, alpha
fetoprotein, and human chorionic gonadotrophin levels. Consider
surgical consultation for possible excision if symptoms become
problematic.

I have discussed the findings and recommendations with the patient.
If applicable, a reminder letter will be sent to the patient
regarding the next appointment.

BI-RADS CATEGORY  2: Benign.

## 2022-04-16 ENCOUNTER — Other Ambulatory Visit: Payer: Self-pay | Admitting: Internal Medicine

## 2022-04-16 DIAGNOSIS — E109 Type 1 diabetes mellitus without complications: Secondary | ICD-10-CM

## 2022-04-16 NOTE — Telephone Encounter (Signed)
Next appt scheduled 6/19 with PCP. 

## 2022-04-19 ENCOUNTER — Encounter: Payer: Self-pay | Admitting: Student

## 2022-04-19 ENCOUNTER — Ambulatory Visit: Payer: Medicaid Other | Admitting: Student

## 2022-04-19 DIAGNOSIS — F333 Major depressive disorder, recurrent, severe with psychotic symptoms: Secondary | ICD-10-CM

## 2022-04-19 DIAGNOSIS — E109 Type 1 diabetes mellitus without complications: Secondary | ICD-10-CM | POA: Diagnosis not present

## 2022-04-19 DIAGNOSIS — Z794 Long term (current) use of insulin: Secondary | ICD-10-CM

## 2022-04-19 DIAGNOSIS — F250 Schizoaffective disorder, bipolar type: Secondary | ICD-10-CM | POA: Diagnosis present

## 2022-04-19 MED ORDER — NOVOLOG FLEXPEN 100 UNIT/ML ~~LOC~~ SOPN: PEN_INJECTOR | SUBCUTANEOUS | 2 refills | Status: DC

## 2022-04-19 MED ORDER — GLUCAGON (RDNA) 1 MG IJ KIT: 1.0000 mg | PACK | INTRAMUSCULAR | 1 refills | Status: DC

## 2022-04-19 NOTE — Patient Instructions (Addendum)
Thank you, Mr.Emmaus Space for allowing [place] to provide your care today. Today we discussed your diabetes and recent hypoglycemic episodes.  I have modified your regimen again to decrease your risk of hypoglycemic episodes.  I want you to start doing a sliding scale insulin as instructed below.  Important to make sure you are eating consistently and injecting your insulin as prescribed.  I have ordered the following medication/changed the following medications:  Change NovoLog to sliding scale 3 times daily after each meal, Inject 2 units if BS 100-140, inject 3 units if BS 141-180, inject 4 units if BS 181-220, inject 5 units if BS > 220 Start glucagon injection as needed for hypoglycemic episodes  My Chart Access: [email or web address]  Please follow-up in 4 weeks  Please make sure to arrive 15 minutes prior to your next appointment. If you arrive late, you may be asked to reschedule.    We look forward to seeing you next time. Please call our clinic at [phone] if you have any questions or concerns. The best time to call is Monday-Friday from 9am-4pm, but there is someone available 24/7. If after hours or the weekend, call the main hospital number and ask for the Internal Medicine Resident On-Call. If you need medication refills, please notify your pharmacy one week in advance and they will send [place] a request.   Thank you for letting us take part in your care. Wishing you the best!  Steffanie Rainwater, MD 04/19/2022, 10:17 AM IM Resident, PGY-2 [name] 41:10

## 2022-04-19 NOTE — Progress Notes (Signed)
   CC: Diabetes follow-up  HPI:  [name] is a 31 y.o. male with PMH as below who presents with her grandmother to follow-up on his uncontrolled diabetes. Please see problem based charting for evaluation, assessment and plan.  Past Medical History:  Diagnosis Date   Bipolar 1 disorder (HCC)    History of attempted suicide 2019   Unsuccessful suicide attempt in 2019 with rat poison   Schizoaffective disorder (HCC)    Type 1 diabetes mellitus on insulin therapy (HCC) 2020    Review of Systems:  Constitutional: Negative for fever, polydipsia or fatigue Eyes: Negative for visual changes Respiratory: Negative for shortness of breath Abdomen: Negative for abdominal pain, constipation or diarrhea Neuro: Positive for syncope episodes. Negative for headache, dizziness or weakness  Physical Exam: General: Pleasant, well-appearing young male accompanied by grandmother.  No acute distress. Cardiac: RRR. No murmurs, rubs or gallops. No LE edema Respiratory: Lungs CTAB. No wheezing or crackles. Abdominal: Soft, symmetric and non tender. Normal BS. Skin: Warm, dry and intact without rashes or lesions Extremities: Atraumatic. Full ROM. Palpable radial and DP pulses. Neuro: A&O x 3. Moves all extremities. Normal sensation to gross touch. Psych: Appropriate mood and affect.  Vitals:   04/19/22 0918  BP: 124/82  Pulse: 67  Temp: 98.2 F (36.8 C)  TempSrc: Oral  SpO2: 100%  Weight: 159 lb (72.1 kg)  Height: 5\' 8"  (1.727 m)    Assessment & Plan:   See Encounters Tab for problem based charting.  Patient discussed with Dr. , MD, MPH

## 2022-04-20 ENCOUNTER — Encounter: Payer: Self-pay | Admitting: Student

## 2022-04-20 ENCOUNTER — Other Ambulatory Visit: Payer: Self-pay

## 2022-04-20 DIAGNOSIS — E109 Type 1 diabetes mellitus without complications: Secondary | ICD-10-CM

## 2022-04-20 MED ORDER — DEXCOM G6 TRANSMITTER MISC: 3 refills | Status: DC

## 2022-04-20 NOTE — Assessment & Plan Note (Signed)
>>  ASSESSMENT AND PLAN FOR UNCONTROLLED TYPE 1 DIABETES MELLITUS WITH HYPOGLYCEMIA WITHOUT COMA (HCC) WRITTEN ON 04/20/2022  8:43 AM BY AMPONSAH, CLARETTA HERO, MD  Patient here for diabetes follow-up after adjustment to his regimen and further counseling in the office 6 weeks ago.  Patient accompanied by his grandmother who states patient does not always do what he is supposed to do to manage his diabetes. He has had 2 episodes of blacking out when his blood sugar is low in the 30s to 40s. The first episode happened at home and the second while patient was at work.  Patient states he sometimes does not eat and still give himself short acting insulin . He is very inconsistent with his meals even after multiple discussions about the importance of eating consistent meals daily. Patient also states that the transmitter for his CGM fell off and the new prescription is pending prior authorization. Grandmother has been checking his blood sugar with a glucose meter but forgot to bring the meter. States his blood sugar has been highly variable and patient has had problems with low sugars for very long time. Patient counseled again on the importance of strict blood sugar control in order to decrease his risk of microvascular and macrovascular complications in the future. However, at this point I am more concerned about his blacking out episodes due to significantly low blood sugars. Plan to modify patient's short acting regimen to a sliding scale to ensure patient does not give himself too much insulin  if blood sugar is already low. Educated patient and grandmother about how to do sliding scale based on his blood sugar after eating.  Also discussed ordering patient glucagon  injection to be used for any hypoglycemic episodes. Plan to reassess after a few weeks once patient has received his CGM transmitter and has had at least 2 weeks of readings.  Plan: -Continue Tresiba  27 units daily -Change NovoLog  with BID w/ meals to  NovoLog  sliding scale (2-5 units) -Start glucagon  IM injection for hypoglycemic episodes -Follow-up with [name] or nutrition educator -Follow-up in 4 to 6 weeks, consider initiating discussions about insulin  pump

## 2022-04-20 NOTE — Assessment & Plan Note (Addendum)
Patient here for diabetes follow-up after adjustment to his regimen and further counseling in the office 6 weeks ago.  Patient accompanied by his grandmother who states patient does not always do what he is supposed to do to manage his diabetes. He has had 2 episodes of blacking out when his blood sugar is low in the 30s to 40s. The first episode happened at home and the second while patient was at work.  Patient states he sometimes does not eat and still give himself short acting insulin. He is very inconsistent with his meals even after multiple discussions about the importance of eating consistent meals daily. Patient also states that the transmitter for his CGM fell off and the new prescription is pending prior authorization. Grandmother has been checking his blood sugar with a glucose meter but forgot to bring the meter. States his blood sugar has been highly variable and patient has had problems with low sugars for very long time. Patient counseled again on the importance of strict blood sugar control in order to decrease his risk of microvascular and macrovascular complications in the future. However, at this point I am more concerned about his blacking out episodes due to significantly low blood sugars. Plan to modify patient's short acting regimen to a sliding scale to ensure patient does not give himself too much insulin if blood sugar is already low. Educated patient and grandmother about how to do sliding scale based on his blood sugar after eating.  Also discussed ordering patient glucagon injection to be used for any hypoglycemic episodes. Plan to reassess after a few weeks once patient has received his CGM transmitter and has had at least 2 weeks of readings.  Plan: -Continue Tresiba 27 units daily -Change NovoLog with BID w/ meals to NovoLog sliding scale (2-5 units) -Start glucagon IM injection for hypoglycemic episodes -Follow-up with [name] or nutrition educator -Follow-up in 4 to 6 weeks,  consider initiating discussions about insulin pump

## 2022-04-20 NOTE — Assessment & Plan Note (Addendum)
Relatively stable.  Invega injections have now been transitioned to every 3 months. Last injection on May 4th.  -Continue Invega injections every 3 months -Continue trazodone 100 mg as needed at bedtime -Continue to follow-up with behavioral health

## 2022-04-20 NOTE — Assessment & Plan Note (Signed)
PHQ-9 score of 0 today.  Patient follows with [facility] behavioral health for management of his schizoaffective disorder. -Continue to follow-up with [facility]

## 2022-05-09 ENCOUNTER — Emergency Department (HOSPITAL_COMMUNITY)
Admission: EM | Admit: 2022-05-09 | Discharge: 2022-05-09 | Disposition: A | Discharge status: Home / Self Care | Payer: Medicaid Other | Attending: Emergency Medicine | Admitting: Emergency Medicine

## 2022-05-09 ENCOUNTER — Encounter (HOSPITAL_COMMUNITY): Payer: Self-pay

## 2022-05-09 DIAGNOSIS — Z20822 Contact with and (suspected) exposure to covid-19: Secondary | ICD-10-CM | POA: Diagnosis not present

## 2022-05-09 DIAGNOSIS — Z79899 Other long term (current) drug therapy: Secondary | ICD-10-CM | POA: Insufficient documentation

## 2022-05-09 DIAGNOSIS — F259 Schizoaffective disorder, unspecified: Secondary | ICD-10-CM

## 2022-05-09 DIAGNOSIS — F333 Major depressive disorder, recurrent, severe with psychotic symptoms: Secondary | ICD-10-CM | POA: Diagnosis not present

## 2022-05-09 DIAGNOSIS — E1065 Type 1 diabetes mellitus with hyperglycemia: Secondary | ICD-10-CM | POA: Insufficient documentation

## 2022-05-09 DIAGNOSIS — R739 Hyperglycemia, unspecified: Secondary | ICD-10-CM | POA: Diagnosis present

## 2022-05-09 LAB — RAPID URINE DRUG SCREEN, HOSP PERFORMED
Amphetamines: POSITIVE — AB
Barbiturates: NOT DETECTED
Benzodiazepines: NOT DETECTED
Cocaine: NOT DETECTED
Opiates: NOT DETECTED
Tetrahydrocannabinol: POSITIVE — AB

## 2022-05-09 LAB — CBC WITH DIFFERENTIAL/PLATELET
Abs Immature Granulocytes: 0.02 10*3/uL (ref 0.00–0.07)
Basophils Absolute: 0 10*3/uL (ref 0.0–0.1)
Basophils Relative: 1 %
Eosinophils Absolute: 0 10*3/uL (ref 0.0–0.5)
Eosinophils Relative: 0 %
HCT: 40.4 % (ref 39.0–52.0)
Hemoglobin: 13.7 g/dL (ref 13.0–17.0)
Immature Granulocytes: 0 %
Lymphocytes Relative: 27 %
Lymphs Abs: 1.4 10*3/uL (ref 0.7–4.0)
MCH: 29.3 pg (ref 26.0–34.0)
MCHC: 33.9 g/dL (ref 30.0–36.0)
MCV: 86.5 fL (ref 80.0–100.0)
Monocytes Absolute: 0.4 10*3/uL (ref 0.1–1.0)
Monocytes Relative: 9 %
Neutro Abs: 3.3 10*3/uL (ref 1.7–7.7)
Neutrophils Relative %: 63 %
Platelets: 283 10*3/uL (ref 150–400)
RBC: 4.67 MIL/uL (ref 4.22–5.81)
RDW: 12.2 % (ref 11.5–15.5)
WBC: 5.2 10*3/uL (ref 4.0–10.5)
nRBC: 0 % (ref 0.0–0.2)

## 2022-05-09 LAB — COMPREHENSIVE METABOLIC PANEL
ALT: 26 U/L (ref 0–44)
AST: 20 U/L (ref 15–41)
Albumin: 4.6 g/dL (ref 3.5–5.0)
Alkaline Phosphatase: 65 U/L (ref 38–126)
Anion gap: 8 (ref 5–15)
BUN: 13 mg/dL (ref 6–20)
CO2: 24 mmol/L (ref 22–32)
Calcium: 9.3 mg/dL (ref 8.9–10.3)
Chloride: 103 mmol/L (ref 98–111)
Creatinine, Ser: 0.95 mg/dL (ref 0.61–1.24)
GFR, Estimated: >60 mL/min (ref >60)
Glucose, Bld: 264 mg/dL — ABNORMAL HIGH (ref 70–99)
Potassium: 4.1 mmol/L (ref 3.5–5.1)
Sodium: 135 mmol/L (ref 135–145)
Total Bilirubin: 1.2 mg/dL (ref 0.3–1.2)
Total Protein: 7.8 g/dL (ref 6.5–8.1)

## 2022-05-09 LAB — RESP PANEL BY RT-PCR (FLU A&B, COVID) ARPGX2
Influenza A by PCR: NEGATIVE
Influenza B by PCR: NEGATIVE
SARS Coronavirus 2 by RT PCR: NEGATIVE

## 2022-05-09 LAB — CBG MONITORING, ED: Glucose-Capillary: 282 mg/dL — ABNORMAL HIGH (ref 70–99)

## 2022-05-09 LAB — ETHANOL: Alcohol, Ethyl (B): <10 mg/dL (ref <10)

## 2022-05-09 MED ORDER — INSULIN ASPART 100 UNIT/ML IJ SOLN
0.0000 [IU] | Freq: Every day | INTRAMUSCULAR | Status: DC
  Filled 2022-05-09: qty 0.05

## 2022-05-09 MED ORDER — ZOLPIDEM TARTRATE 5 MG PO TABS: 5.0000 mg | ORAL_TABLET | Freq: Every evening | ORAL | Status: DC | PRN

## 2022-05-09 MED ORDER — RISPERIDONE 0.5 MG PO TBDP: 2.0000 mg | ORAL_TABLET | Freq: Three times a day (TID) | ORAL | Status: DC | PRN

## 2022-05-09 MED ORDER — LORAZEPAM 1 MG PO TABS: 1.0000 mg | ORAL_TABLET | ORAL | Status: DC | PRN

## 2022-05-09 MED ORDER — ALUM & MAG HYDROXIDE-SIMETH 200-200-20 MG/5ML PO SUSP: 30.0000 mL | Freq: Four times a day (QID) | ORAL | Status: DC | PRN

## 2022-05-09 MED ORDER — ZIPRASIDONE MESYLATE 20 MG IM SOLR: 20.0000 mg | INTRAMUSCULAR | Status: DC | PRN

## 2022-05-09 MED ORDER — NICOTINE 21 MG/24HR TD PT24: 21.0000 mg | MEDICATED_PATCH | Freq: Every day | TRANSDERMAL | Status: DC

## 2022-05-09 MED ORDER — NICOTINE 21 MG/24HR TD PT24: 21.0000 mg | MEDICATED_PATCH | Freq: Every day | TRANSDERMAL | 0 refills | Status: DC

## 2022-05-09 MED ORDER — ACETAMINOPHEN 325 MG PO TABS: 650.0000 mg | ORAL_TABLET | ORAL | Status: DC | PRN

## 2022-05-09 MED ORDER — INSULIN ASPART 100 UNIT/ML IJ SOLN
0.0000 [IU] | Freq: Three times a day (TID) | INTRAMUSCULAR | Status: DC
  Filled 2022-05-09: qty 0.15

## 2022-05-09 MED ORDER — ONDANSETRON HCL 4 MG PO TABS: 4.0000 mg | ORAL_TABLET | Freq: Three times a day (TID) | ORAL | Status: DC | PRN

## 2022-05-09 MED ORDER — INSULIN ASPART 100 UNIT/ML IJ SOLN
4.0000 [IU] | Freq: Three times a day (TID) | INTRAMUSCULAR | Status: DC
  Filled 2022-05-09: qty 0.04

## 2022-05-09 NOTE — ED Provider Triage Note (Signed)
Emergency Medicine Provider Triage Evaluation Note  Herman Kittel , a 31 y.o. male  was evaluated in triage.  Pt complains of psychosis. Report wanting his diabetes check.  Family report pt recently had his psych med change and he experience increased agitation and psychosis x 1 week.  Denies SI/HI  Review of Systems  Positive: As above Negative: As above  Physical Exam  BP (!) 136/111 (BP Location: Left Arm)   Pulse 96   Temp 98.7 F (37.1 C) (Oral)   Resp 17   SpO2 98%  Gen:   Awake, no distress   Resp:  Normal effort  MSK:   Moves extremities without difficulty  Other:    Medical Decision Making  Medically screening exam initiated at 10:36 AM.  Appropriate orders placed.  Deryl Cochrane was informed that the remainder of the evaluation will be completed by another provider, this initial triage assessment does not replace that evaluation, and the importance of remaining in the ED until their evaluation is complete.     Fayrene Helper, [name] 05/09/22 1037

## 2022-05-09 NOTE — ED Notes (Signed)
One bag of patient belongings taken and secured behind station w/ patient labels. Phone, dexcom and vape given to visitor.

## 2022-05-09 NOTE — ED Triage Notes (Signed)
Pt presents with c/o hyperglycemia and mental health problem. Pt reports that he is here to have his "kidneys checked' and that his blood sugars are elevated. Family at bedside report that he is hearing voices. When this RN asked pt if he was hearing voices, he hesitated but said no. He told this RN that he drank rat poison to kill his conscience.
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
## 2022-05-09 NOTE — Discharge Instructions (Signed)
Please follow-up with your doctor for further managements of your condition.  Please take your diabetic medication as appropriate.  Your kidney function is normal today.  Please avoid using any recreational drugs as they can make your condition worse.

## 2022-05-09 NOTE — ED Provider Notes (Signed)
[facility]-EMERGENCY DEPT Provider Note   CSN: [id number] Arrival date & time: 05/09/22  1018     History  Chief Complaint  Patient presents with   Hyperglycemia   Mental Health Problem    Jacob [name] is a 31 y.o. male.  The history is provided by the patient, a relative and medical records. No language interpreter was used.  Hyperglycemia Mental Health Problem   31 year old male with significant history of schizoaffective disorder, type I diabetic presents ED accompanied by family member with concerns for psychosis.  Per grandmother who is in the room, patient is having more trouble with hallucination including hearing voices for the past week after his psychiatric medication was changed several weeks ago.  States he normally receives monthly injection of Invega however his doctor recently switched the injection to once every 3 months but increase the dosage.  Family felt patient is having more trouble with hallucination than at his baseline.  There was concern.  Currently states he just wants to have his blood sugar checked.  He admits that he does hear voices and told that in the past he tries to "drink rat poison to kill my conscience".  States the last time he tried that was a year ago.  He denies SI HI.  He denies any active pain.  He denies alcohol or tobacco use.  He voiced frustration that his grandmother would normally come into his room to bother him.  Home Medications Prior to Admission medications   Medication Sig Start Date End Date Taking? Authorizing Provider  acetaminophen (TYLENOL) 500 MG tablet Take 1,000 mg by mouth every 6 (six) hours as needed for moderate pain or headache.    [provider]  Continuous Blood Gluc Receiver (DEXCOM G6 RECEIVER) DEVI 1 each by Continuous infusion (non-IV) route See admin instructions. Use as directed for continuous blood glucose monitoring. 11/18/20   [provider]  Continuous Blood Gluc  Sensor (DEXCOM G6 SENSOR) MISC Replace every 10 days. Use to monitor blood sugar continuously 12/28/21   Steffanie Rainwater, MD  Continuous Blood Gluc Transmit (DEXCOM G6 TRANSMITTER) MISC Use as directed for continuous glucose monitoring. Use Transmitter for 90 days. Discard and replace. 04/20/22   Steffanie Rainwater, MD  glucagon 1 MG injection Inject 1 mg into the skin See admin instructions. Follow package directions for low blood sugar. 04/19/22 04/19/23  Steffanie Rainwater, MD  hydrOXYzine (ATARAX) 10 MG tablet Take 1 tablet (10 mg total) by mouth 3 (three) times daily as needed for anxiety. 03/04/22   Rankin, Shuvon B, NP  insulin aspart (NOVOLOG FLEXPEN) 100 UNIT/ML FlexPen Inject 3 times daily after each meal, inject 2 units if BS 100-140, inject 3 units if BS 141-180, inject 4 units if BS 181-220, inject 5 units if BS > 220 04/19/22   Amponsah, Flossie Buffy, MD  insulin degludec (TRESIBA FLEXTOUCH) 100 UNIT/ML FlexTouch Pen Inject 27 Units into the skin daily. 04/16/22   Steffanie Rainwater, MD  Insulin Pen Needle 32G X 4 MM MISC Use to inject insulin 4 times a day 12/28/21   Steffanie Rainwater, MD  Paliperidone Palmitate ER (INVEGA TRINZA) 546 MG/1.75ML injection Inject 1.75 mLs (546 mg total) into the muscle every 3 (three) months. 03/04/22   Rankin, Shuvon B, NP  traZODone (DESYREL) 100 MG tablet Take 1 tablet (100 mg total) by mouth at bedtime as needed for sleep. 03/04/22   Rankin, Denice Bors B, NP      Allergies  Patient has no known allergies.    Review of Systems   Review of Systems  All other systems reviewed and are negative.   Physical Exam Updated Vital Signs BP (!) 136/111 (BP Location: Left Arm)   Pulse 96   Temp 98.7 F (37.1 C) (Oral)   Resp 17   SpO2 98%  Physical Exam Vitals and nursing note reviewed.  Constitutional:      General: He is not in acute distress.    Appearance: He is well-developed.  HENT:     Head: Atraumatic.  Eyes:     Conjunctiva/sclera: Conjunctivae  normal.  Musculoskeletal:     Cervical back: Neck supple.  Skin:    Findings: No rash.  Neurological:     Mental Status: He is alert.     GCS: GCS eye subscore is 4. GCS verbal subscore is 5. GCS motor subscore is 6.  Psychiatric:        Mood and Affect: Mood normal.        Speech: Speech normal.        Behavior: Behavior is cooperative.        Thought Content: Thought content does not include homicidal or suicidal ideation.     Comments: Does not appear to respond to internal stimuli    ED Results / Procedures / Treatments   Labs (all labs ordered are listed, but only abnormal results are displayed) Labs Reviewed  COMPREHENSIVE METABOLIC PANEL - Abnormal; Notable for the following components:      Result Value   Glucose, Bld 264 (*)    All other components within normal limits  RAPID URINE DRUG SCREEN, HOSP PERFORMED - Abnormal; Notable for the following components:   Amphetamines POSITIVE (*)    Tetrahydrocannabinol POSITIVE (*)    All other components within normal limits  CBG MONITORING, ED - Abnormal; Notable for the following components:   Glucose-Capillary 282 (*)    All other components within normal limits  RESP PANEL BY RT-PCR (FLU A&B, COVID) ARPGX2  ETHANOL  CBC WITH DIFFERENTIAL/PLATELET    EKG None  Radiology No results found.  Procedures Procedures    Medications Ordered in ED Medications  risperiDONE (RISPERDAL M-TABS) disintegrating tablet 2 mg (has no administration in time range)    And  LORazepam (ATIVAN) tablet 1 mg (has no administration in time range)    And  ziprasidone (GEODON) injection 20 mg (has no administration in time range)  acetaminophen (TYLENOL) tablet 650 mg (has no administration in time range)  zolpidem (AMBIEN) tablet 5 mg (has no administration in time range)  ondansetron (ZOFRAN) tablet 4 mg (has no administration in time range)  alum & mag hydroxide-simeth (MAALOX/MYLANTA) 200-200-20 MG/5ML suspension 30 mL (has no  administration in time range)  nicotine (NICODERM CQ - dosed in mg/24 hours) patch 21 mg (has no administration in time range)  insulin aspart (novoLOG) injection 0-15 Units (has no administration in time range)  insulin aspart (novoLOG) injection 0-5 Units (has no administration in time range)  insulin aspart (novoLOG) injection 4 Units (has no administration in time range)    ED Course/ Medical Decision Making/ A&P                           Medical Decision Making Amount and/or Complexity of Data Reviewed Labs: ordered.  Risk OTC drugs. Prescription drug management.   BP (!) 136/111 (BP Location: Left Arm)   Pulse 96   Temp 98.7  F (37.1 C) (Oral)   Resp 17   SpO2 98%   12:11 PM This is a 31 year old male with significant history of schizophrenia and history of diabetes who was brought here due to worsening auditory hallucinations and according to family member after he had his Invega doses changed in frequency change.  He normally receives an injection monthly but his doctor recently gave him a higher dose of Invega every 3 months.  Family reports that with the new medication changes over the past week he is having more trouble with psychosis and more agitated.  Patient currently without any significant complaint.  He denies SI or HI.  He admits that he tries to take rat poison to "kill my conscience" but that happen a year ago.  He denies any recent drug use.  He does admits to having history of diabetes and would like to have his blood sugar checked.  He is without any specific complaint at this time.  He does voiced his annoyance that his grandmother often come into his room to check up on him when he just wants to rest.  On exam patient is calm and cooperative and answers questions appropriately.  Labs obtained independently viewed interpreted by me and at this time patient is medically cleared.  CBG is 264 without any anion gap concerning for DKA.  Will request TTS for further  psychiatric evaluation.  I have placed patient on a insulin sliding scale for better management of his blood sugar.  I have ordered his home medication.  I will also provide purulent medications as well.  Currently patient is not under IVC.  2:24 PM I have reviewed patient's labs and interpreted as appropriate.  As mentioned earlier elevated CBG of 264 without anion gap concerning for DKA.  UDS positive for amphetamine and tetrahydrocannabinol.  Patient is not on any medication that can cause a positive amphetamine level.  His psychosis may be drug-induced.   Nurse informed me that psychiatry team has seen and evaluated patient and deemed the patient is psychiatrically cleared and can be discharged home.  I discussed this with family member and with patient and they agree to follow-up with his psychiatrist outpatient for further management of his condition.  Return precaution given.  Patient stable for discharge.  I have considered patient's social determinant of health including tobacco use and recommend tobacco cessation.          Final Clinical Impression(s) / ED Diagnoses Final diagnoses:  Schizoaffective disorder, unspecified type (HCC)  Hyperglycemia    Rx / DC Orders ED Discharge Orders     None         Fayrene Helper, [name] 05/09/22 1429    Virgina Norfolk, DO 05/09/22 1441

## 2022-05-09 NOTE — ED Notes (Signed)
Belongings returned to patient. Discharge instructions reviewed, no questions at this time.

## 2022-05-09 NOTE — ED Notes (Signed)
Josephine from [facility] at bedside.

## 2022-05-11 ENCOUNTER — Encounter (HOSPITAL_COMMUNITY): Payer: Self-pay

## 2022-05-11 ENCOUNTER — Encounter (HOSPITAL_COMMUNITY): Payer: Self-pay | Admitting: Registered Nurse

## 2022-05-11 ENCOUNTER — Ambulatory Visit (INDEPENDENT_AMBULATORY_CARE_PROVIDER_SITE_OTHER): Payer: Medicaid Other | Admitting: Registered Nurse

## 2022-05-11 ENCOUNTER — Ambulatory Visit (HOSPITAL_COMMUNITY): Payer: Medicaid Other

## 2022-05-11 VITALS — BP 123/83 | HR 89 | Ht 68.0 in | Wt 150.0 lb

## 2022-05-11 DIAGNOSIS — F19959 Other psychoactive substance use, unspecified with psychoactive substance-induced psychotic disorder, unspecified: Secondary | ICD-10-CM

## 2022-05-11 DIAGNOSIS — F251 Schizoaffective disorder, depressive type: Secondary | ICD-10-CM

## 2022-05-11 DIAGNOSIS — F411 Generalized anxiety disorder: Secondary | ICD-10-CM

## 2022-05-11 MED ORDER — OLANZAPINE 5 MG PO TBDP: 5.0000 mg | ORAL_TABLET | Freq: Every day | ORAL | 0 refills | Status: DC

## 2022-05-11 NOTE — Progress Notes (Unsigned)
Patient arrived with Grandma this AM due to Patient states he's hearing voices,  "telling him you should have done this, you didn't do that right, your life is messed up because"   PROVIDER SEEN PATIENT & NO INJECTION GIVEN @ THIS TIME WILL KEEP SCHEDULE APPT FOR 06/08/22

## 2022-05-11 NOTE — Progress Notes (Signed)
BH MD/PA/NP OP Progress Note  05/11/2022 10:52 AM Jacob [name]  MRN:  [id number]  Chief Complaint:  Chief Complaint  Patient presents with   follow up ED visit and psychiatric evaluation   HPI: Jacob [name], 31 yr. male seen in office today for psychiatric evaluation after emergency room visit 05/09/22 with complaints of worsening auditory hallucinations.  [name] psychiatric history includes psychiatric history of schizoaffective disorder depressive type, Major depressive disorder with psychotic features, and suicidal ideation.  His mental health is managed with Hinda GlatterInvega Trinza 561.6 mg every 3 months.  His next injection is due 06/03/22.  He reports he medication has been working well up to a couple weeks ago when he started having auditory hallucinations.  He states that the voices are like conversations telling him "What I should have don or why didn't I do that.  Telling me things about life."  States that the voices are non-command and are not negative.  Patient informed that his urine drug screen shows positive for amphetamine.  Patient asked what he had taking that could have had amphetamine in it and he admits that for the last couple of weeks he has been doing methamphetamine.  "I just wanted to try it to help me relax."  Patient education on methamphetamine use and the side effects.  He voices his understanding and states that he is not going to do it anymore.  His grandmother is present and patient gave permission for her to stay during visit and permission to also discuss care and findings with her.  Grandmother voices that she now understands what has been going on and states that she also noted the change in his behavior and him talking to himself that started a couple a weeks ago "I knew something was going on."  Informed his [name] has been working related to information given during prior visits that there were no symptoms of paranoia or psychosis.  Symptoms did not occur until after he  started using methamphetamine and symptoms would continue and worsen if he continued to use it.  He and grandmother voiced understanding.  Discussed starting another antipsychotic for a short period of time to help clear up psychosis and both agreed that it was needed because his actions were also affecting his performance at work and he lost his job at [employer].  He continues to work at [employer] and [employer].  [name] denies depression, anxiety, and suicidal/self-harm/homicidal ideations.  But there have been episodes of mood swings with irritability and agitation.  He reports he has been eating without any difficulty but there have been days when he was unable to sleep.  Again reiterated side effects of methamphetamine affecting sleep, mood, appetite, and causing psychosis, .  Provider conducted GAD 7, PHQ-9, CSRRS, and AIMS see scores below.   Visit Diagnosis:    ICD-10-CM   1. Schizoaffective disorder, depressive type (HCC)  F25.1     2. Generalized anxiety disorder  F41.1     3. 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 No pertinent surgical history.  Family Psychiatric History: None reported  Family History:  Family History  Problem Relation Age of Onset   Healthy Mother    Healthy Father     Social History:  Social History   Socioeconomic History   Marital status: Single    Spouse name: Not on file   Number of children: Not on file   Years of education: Not on file    Highest education level: Not on file  Occupational History   Not on file  Tobacco Use   Smoking status: Some Days    Types: Cigarettes   Smokeless tobacco: Never   Tobacco comments:    2 BLACK AND MILD A DAY  Vaping Use   Vaping Use: Never used  Substance and Sexual Activity   Alcohol use: No   Drug use: Yes    Types: Marijuana   Sexual activity: Not on file  Other Topics Concern   Not on file  Social History Narrative   Not on file   Social Determinants of Health   Financial Resource Strain: Not on file  Food Insecurity: Not on file  Transportation Needs: Not on file  Physical Activity: Not on file  Stress: Not on file  Social Connections: Not on file    Allergies: No Known Allergies  Metabolic Disorder Labs: Lab Results  Component Value Date   HGBA1C 9.9 (A) 03/03/2022   MPG >398 07/28/2020   MPG 294.83 06/08/2020   No results found for: "PROLACTIN" Lab Results  Component Value Date   CHOL 202 (H) 03/03/2022   TRIG 54 03/03/2022   HDL 67 03/03/2022   CHOLHDL 3.0 03/03/2022   VLDL 8 11/16/2019   LDLCALC 125 (H) 03/03/2022   LDLCALC 91 11/16/2019   Lab Results  Component Value Date   TSH 1.568 11/16/2019   TSH 1.277 02/06/2018    Therapeutic Level Labs: No results found for: "LITHIUM" No results found for: "VALPROATE" No results found for: "CBMZ"  Current Medications: Current Outpatient Medications  Medication Sig Dispense Refill   OLANZapine zydis (ZYPREXA ZYDIS) 5 MG disintegrating tablet Take 1 tablet (5 mg total) by mouth at bedtime. 10 tablet 0   Continuous Blood Gluc Receiver (DEXCOM G6 RECEIVER) DEVI 1 each by Continuous infusion (non-IV) route See admin instructions. Use as directed for continuous blood glucose monitoring.     Continuous Blood Gluc Sensor (DEXCOM G6 SENSOR) MISC Replace every 10 days. Use to monitor blood sugar continuously 9 each 3   Continuous Blood Gluc Transmit (DEXCOM G6 TRANSMITTER) MISC Use as directed for  continuous glucose monitoring. Use Transmitter for 90 days. Discard and replace. 1 each 3   glucagon 1 MG injection Inject 1 mg into the skin See admin instructions. Follow package directions for low blood sugar. 1 each 1   hydrOXYzine (ATARAX) 10 MG tablet Take 1 tablet (10 mg total) by mouth 3 (three) times daily as needed for anxiety. 90 tablet 2   insulin aspart (NOVOLOG FLEXPEN) 100 UNIT/ML FlexPen Inject 3 times daily after each meal, inject 2 units if BS 100-140, inject 3 units if BS 141-180, inject 4 units if BS 181-220, inject 5 units if BS > 220 15 mL 2   insulin degludec (TRESIBA FLEXTOUCH) 100 UNIT/ML FlexTouch Pen Inject 27 Units into the skin daily. 3 mL 15   Insulin Pen Needle 32G X 4 MM MISC Use to inject insulin 4 times a day 360 each 3  nicotine (NICODERM CQ - DOSED IN MG/24 HOURS) 21 mg/24hr patch Place 1 patch (21 mg total) onto the skin daily. 28 patch 0   traZODone (DESYREL) 100 MG tablet Take 1 tablet (100 mg total) by mouth at bedtime as needed for sleep. 30 tablet 2   Current Facility-Administered Medications  Medication Dose Route Frequency Provider Last Rate Last Admin   Paliperidone Palmitate ER SUSY 561 mg  561 mg Intramuscular Q90 days Tanecia Mccay B, NP         Musculoskeletal: Strength & Muscle Tone: within normal limits Gait & Station: normal Patient leans: N/A  Psychiatric Specialty Exam: Review of Systems  Constitutional: Negative.   HENT: Negative.    Eyes: Negative.   Respiratory: Negative.    Cardiovascular: Negative.   Gastrointestinal: Negative.   Genitourinary: Negative.   Musculoskeletal: Negative.   Skin: Negative.   Neurological: Negative.   Hematological: Negative.   Psychiatric/Behavioral:  Positive for hallucinations (auditory hallucinations that started and worsened with methamphetamine use) and sleep disturbance (Problems sleeping with meth use). Negative for confusion, dysphoric mood and self-injury. Agitation: Denies at this time  but states there has been some agitation.The patient is hyperactive (periods of restlessness with meth use). The patient is not nervous/anxious.        Admitted to methamphetamine use for the last couple of weeks and that it is when the mood swings and hallucinations started.   Grandmother reporting "It was like it was before he started taking his medicine."     There were no vitals taken for this visit.There is no height or weight on file to calculate BMI.  General Appearance: Casual  Eye Contact:  Good  Speech:  Clear and Coherent and Normal Rate  Volume:  Normal  Mood:  Euthymic  Affect:  Appropriate and Congruent  Thought Process:  Coherent, Goal Directed, and Descriptions of Associations: Intact  Orientation:  Full (Time, Place, and Person)  Thought Content: WDL, Logical, and Hallucinations: Auditory   Suicidal Thoughts:  No  Homicidal Thoughts:  No  Memory:  Immediate;   Good Recent;   Good Remote;   Good  Judgement:  Intact  Insight:  Present  Psychomotor Activity:  Normal  Concentration:  Concentration: Good and Attention Span: Good  Recall:  Good  Fund of Knowledge: Good  Language: Good  Akathisia:  No  Handed:  Right  AIMS (if indicated): done  Assets:  Communication Skills Desire for Improvement Financial Resources/Insurance Housing Leisure Time Physical Health Resilience Social Support Transportation  ADL's:  Intact  Cognition: WNL  Sleep:  Fair   Screenings: AIMS    Flowsheet Row Office Visit from 05/11/2022 in [facility] Office Visit from 01/28/2022 in [facility] Office Visit from 07/30/2021 in [facility]) [facility] (Dis[facility] from OP [facility]  AIMS Total Score 0 0 0 0 0      AUDIT    Flowsheet Row Admission (Discharged)  [facility] Ad[facility] (Discharged) from OP Visit [facility] (Discharged) [facility]  Alcohol Use Disorder Identification Test Final Score (AUDIT) 0 2 0      GAD-7    Flowsheet Row Office Visit from 05/11/2022 in [facility] Office Visit from 01/28/2022  in [facility] Clinical Support from 04/23/2021 in [facility] Video Visit from 01/27/2021 in [facility] Counselor from 01/07/2021 in [facility]  Total GAD-7 Score 0 0 6 8 15       PHQ2-9    Flowsheet Row Office Visit from 05/11/2022 in [facility] Office Visit from 04/19/2022 in [facility] Office Visit from 03/03/2022 in [facility] Office Visit from 02/03/2022 in [facility] Office Visit from 01/28/2022 in [facility]  PHQ-2 Total Score 0 0 0 0 0  PHQ-9 Total Score -- -- -- 0 0      Flowsheet Row Office Visit from 05/11/2022 in [facility] ED from 05/09/2022 in [facility] Office Visit from 01/28/2022 in [facility]  C-SSRS RISK CATEGORY No Risk No Risk No Risk      Assessment and Plan: Montford Bowland appears to be doing well today; reporting he is doing better but still having the auditory hallucination that are non-command.  Initially felt that his medications were no longer working but with education realizes that it was the methamphetamine use that caused the worsening in his mental health.   During visit he is dressed appropriate for weather.  He is sitting upright in chair with no noted distress.  He is alert/oriented x 4,  calm/cooperative and mood is congruent with affect.  He spoke in a clear tone at moderate volume, and normal pace, with good eye contact.  His thought process is coherent and relevant; and there is no indication that he is currently responding to internal/external stimuli, or experiencing delusional thought content other than his admission to having auditory hallucinations.  He denies depression, anxiety, suicidal/self-harm/homicidal ideation.  He admits and is now aware that his psychosis, paranoia, and mood fluctuations are a result of his methamphetamine use.  He and grandmother both felt that patient needed something to help with the auditory hallucinations, and mood fluctuations so discussed stating a short-term use of Zyprexa Zydis at 5 mg  Q hs for 10 days.  Informed he should be clear by his next visit on 06/04/22 but if symptoms worsened to come back to office as a walk in.  Understanding voiced.    EKG Interpretation Date/Time:  05/09/22 at 1345    Ventricular Rate:  79 BPM     Sinus rhythm PR Interval:  158 ms   Consider right atrial enlargement QRS Duration:  84 ms QT Interval: 356/ ms   Borderline right axis deviation QTC Calculation:  408 ms   ST elevation suggests earoly repolarization P-R-T Axis:  79 97 59     Text Interpretation:   Confirmed by 07/10/22 [phone]) on 05/05/2021 1:53:31 PM   Collaboration of Care: Collaboration of Care: Medication Management AEB Ordered Zyprexa Zydis 5 mg Q hs, 10 tablets with 0 refills  Patient/Guardian was advised Release of Information must be obtained prior to any record release in order to collaborate their care with an outside provider. Patient/Guardian was advised if they have not already done so to contact the registration department to sign all necessary forms in order for 07/06/2021 to release information regarding their care.   Consent: Patient/Guardian gives verbal consent for treatment and assignment of benefits for services provided during this  visit. Patient/Guardian expressed understanding and agreed to proceed.   Keep scheduled  appointment for 06/03/22 for injection of Invega Trinza  Dorn Hartshorne, NP 05/11/2022, 10:52 AM

## 2022-05-12 NOTE — Progress Notes (Signed)
Patient arrived with Grandma this AM due to Patient states he's hearing voices,  "telling him you should have done this, you didn't do that right, your life is messed up because"   PROVIDER SEEN PATIENT & NO INJECTION GIVEN @ THIS TIME WILL KEEP SCHEDULE APPT FOR 06/08/22 

## 2022-06-03 ENCOUNTER — Encounter (HOSPITAL_COMMUNITY): Payer: Self-pay

## 2022-06-03 ENCOUNTER — Ambulatory Visit (INDEPENDENT_AMBULATORY_CARE_PROVIDER_SITE_OTHER): Payer: Medicaid Other

## 2022-06-03 VITALS — BP 131/74 | HR 90 | Ht 68.0 in | Wt 150.0 lb

## 2022-06-03 DIAGNOSIS — F251 Schizoaffective disorder, depressive type: Secondary | ICD-10-CM | POA: Diagnosis not present

## 2022-06-03 DIAGNOSIS — F19959 Other psychoactive substance use, unspecified with psychoactive substance-induced psychotic disorder, unspecified: Secondary | ICD-10-CM

## 2022-06-03 MED ORDER — PALIPERIDONE PALMITATE ER 546 MG/1.75ML IM SUSY
546.0000 mg | PREFILLED_SYRINGE | Freq: Once | INTRAMUSCULAR | Status: AC
  Administered 2022-08-31: 546 mg via INTRAMUSCULAR

## 2022-06-03 NOTE — Progress Notes (Signed)
PATIENT PRESENTS TO OFFICE FOR INVEGA TRINZA INJECTION 546 MG/ 1.75 ML, WAS GIVEN INTO RIGHT DELTOID BY Taurean Ju CMA. HE WILL RETURN IN THREE MONTHS.  PATIENT SEEMS TO BE DOING WELL!

## 2022-06-07 ENCOUNTER — Encounter: Payer: Medicaid Other | Admitting: Student

## 2022-06-10 ENCOUNTER — Encounter: Payer: Self-pay | Admitting: Dietician

## 2022-06-10 ENCOUNTER — Other Ambulatory Visit: Payer: Self-pay

## 2022-06-10 ENCOUNTER — Encounter: Payer: Self-pay | Admitting: Student

## 2022-06-10 ENCOUNTER — Ambulatory Visit: Payer: Medicaid Other | Admitting: Student

## 2022-06-10 VITALS — BP 131/89 | HR 77 | Temp 98.1°F | Ht 69.0 in | Wt 153.0 lb

## 2022-06-10 DIAGNOSIS — Z794 Long term (current) use of insulin: Secondary | ICD-10-CM | POA: Diagnosis not present

## 2022-06-10 DIAGNOSIS — E109 Type 1 diabetes mellitus without complications: Secondary | ICD-10-CM | POA: Diagnosis present

## 2022-06-10 DIAGNOSIS — F333 Major depressive disorder, recurrent, severe with psychotic symptoms: Secondary | ICD-10-CM | POA: Diagnosis not present

## 2022-06-10 DIAGNOSIS — F250 Schizoaffective disorder, bipolar type: Secondary | ICD-10-CM

## 2022-06-10 LAB — GLUCOSE, CAPILLARY: Glucose-Capillary: 136 mg/dL — ABNORMAL HIGH (ref 70–99)

## 2022-06-10 LAB — POCT GLYCOSYLATED HEMOGLOBIN (HGB A1C): Hemoglobin A1C: 9.4 % — AB (ref 4.0–5.6)

## 2022-06-10 MED ORDER — GLUCAGON (RDNA) 1 MG IJ KIT: 1.0000 mg | PACK | INTRAMUSCULAR | 1 refills | Status: DC

## 2022-06-10 MED ORDER — NOVOLOG FLEXPEN 100 UNIT/ML ~~LOC~~ SOPN: PEN_INJECTOR | SUBCUTANEOUS | 2 refills | Status: DC

## 2022-06-10 MED ORDER — INSULIN ASPART 100 UNIT/ML CARTRIDGE (PENFILL): SUBCUTANEOUS | 11 refills | Status: DC

## 2022-06-10 MED ORDER — TRESIBA FLEXTOUCH 100 UNIT/ML ~~LOC~~ SOPN: 20.0000 [IU] | PEN_INJECTOR | Freq: Every day | SUBCUTANEOUS | 15 refills | Status: DC

## 2022-06-10 MED ORDER — INPEN 100-GREY-NOVOLOG-FIASP DEVI: 1 refills | Status: DC

## 2022-06-10 NOTE — Assessment & Plan Note (Signed)
Presented to the ER in behavioral health last month due to auditory hallucinations. These episodes with thought to be secondary to methamphetamine use.  Started on 10-day course of Zyprexa to help with reported acute psychosis.  Received his Invega injection on 8/3. Today, patient states he is not having any more episodes of the hospitalization.  States he was around abdominal cramping and has not intended to use meth again. He has finished the course of the Zyprexa.  Plan: -Continue Invega injection every 3 months -Continue trazodone 100 mg at bedtime as needed for sleep -Continue to follow-up with behavioral

## 2022-06-10 NOTE — Assessment & Plan Note (Addendum)
Patient here to follow-up on his diabetes after transitioning to sliding scale during his last office visit in June. A1c slightly improved from 9.9% 3 months ago to 9.4% today.  According to grandma, patient has not been doing well with sliding scale. He often forgets how much insulin to take and does not always take insulin with some of his meals.  Readings from patient's Dexcom meter shows 35% in range, 29% high, 26% very high, 6% low 4% very low.  Patient has not had any syncope episodes with his low blood sugars but does report that he has felt dizzy and sweaty a few times. Discussed starting patient on an insulin pump however patient and grandmother both agree that patient would not do well with a insulin pump. [name] recommenced the Medtr[employer] insulin [employer] system to help patient know how much insulin to take. Patient and grandmother agreeable to this plan.   Plan:  -Start Novolog Inpen Device w/ cartridges -Change Novolog SSI to meal time: inject 6 units before breakfast, 6 units before lunch, 4 units before dinner and 4 units before snack -Decrease Tresiba to 20 units daily -Follow up 2-4 weeks after starting Inpen insulin delivery system.  -Repeat A1c in 3 months

## 2022-06-10 NOTE — Assessment & Plan Note (Signed)
>>  ASSESSMENT AND PLAN FOR UNCONTROLLED TYPE 1 DIABETES MELLITUS WITH HYPOGLYCEMIA WITHOUT COMA (HCC) WRITTEN ON 06/10/2022 11:48 AM BY AMPONSAH, CLARETTA HERO, MD  Patient here to follow-up on his diabetes after transitioning to sliding scale during his last office visit in June. A1c slightly improved from 9.9% 3 months ago to 9.4% today.  According to grandma, patient has not been doing well with sliding scale. He often forgets how much insulin  to take and does not always take insulin  with some of his meals.  Readings from patient's Dexcom meter shows 35% in range, 29% high, 26% very high, 6% low 4% very low.  Patient has not had any syncope episodes with his low blood sugars but does report that he has felt dizzy and sweaty a few times. Discussed starting patient on an insulin  pump however patient and grandmother both agree that patient would not do well with a insulin  pump. Jacob [name] recommenced the Medtronic insulin  [employer] system to help patient know how much insulin  to take. Patient and grandmother agreeable to this plan.   Plan:  -Start Novolog  Inpen Device w/ cartridges -Change Novolog  SSI to meal time: inject 6 units before breakfast, 6 units before lunch, 4 units before dinner and 4 units before snack -Decrease Tresiba  to 20 units daily -Follow up 2-4 weeks after starting Inpen insulin  delivery system.  -Repeat A1c in 3 months

## 2022-06-10 NOTE — Assessment & Plan Note (Signed)
PHQ-9 remains 0.  He has been stable on every 3 months Invega and as needed trazodone for sleep. -Continue to follow-up with [facility]

## 2022-06-10 NOTE — Progress Notes (Signed)
   CC: Diabetes follow-up  HPI:  Mr.Jia Johansson is a 31 y.o. male with PMH as below who presents with his grandmother to follow-up on his diabetes. Please see problem based charting for evaluation, assessment and plan.  Past Medical History:  Diagnosis Date   Bipolar 1 disorder (HCC)    History of attempted suicide 2019   Unsuccessful suicide attempt in 2019 with rat poison   Schizoaffective disorder (HCC)    Type 1 diabetes mellitus on insulin therapy (HCC) 2020    Review of Systems:  Constitutional: Negative for fatigue, weight changes Eyes: Negative for visual changes Abdomen: Negative for abdominal pain, constipation or diarrhea Neuro: Negative for headache, dizziness or weakness Psych: Negative for delusions or hallucinations  Physical Exam: General: Pleasant, well-appearing young man. No acute distress. Cardiac: RRR. No murmurs, rubs or gallops. No LE edema Respiratory: Lungs CTAB. No wheezing or crackles. Abdominal: Soft, symmetric and non tender. Normal BS. Skin: Warm, dry and intact without rashes or lesions Neuro: A&O x 3. Moves all extremities. No focal deficits. Psych: Appropriate mood and affect. No visual or auditory hallucinations  Vitals:   06/10/22 0934  BP: 131/89  Pulse: 77  Temp: 98.1 F (36.7 C)  TempSrc: Oral  SpO2: 98%  Weight: 153 lb (69.4 kg)  Height: 5\' 9"  (1.753 m)    Assessment & Plan:   See Encounters Tab for problem based charting.  Patient discussed with Dr. , MD, MPH

## 2022-06-10 NOTE — Patient Instructions (Addendum)
Thank you, [name] for allowing [place] to provide your care today. Today we discussed your diabetes and your mental health.   For your diabetes, your A1c is slight down to 9.4%. We have ordered you a Novolog Inpen (smart pen system) to help you take the right dose of insulin at the right time.   For your schizoaffective disorder, continue taking all your medications and follow up with your behavior health provider as scheduled   I have ordered the following labs for you:   Lab Orders         Glucose, capillary         POC Hbg A1C       I have ordered the following medication/changed the following medications:  Start Novolog Inpen system. For your Novolog, inject 6 units before breakfast, 6 units before lunch, 4 units before dinner and 4 units before snack Decrease Tresiba to 20 units daily  My Chart Access: [email or web address]  Please follow-up in 4 weeks  Please make sure to arrive 15 minutes prior to your next appointment. If you arrive late, you may be asked to reschedule.    We look forward to seeing you next time. Please call our clinic at [phone] if you have any questions or concerns. The best time to call is Monday-Friday from 9am-4pm, but there is someone available 24/7. If after hours or the weekend, call the main hospital number and ask for the Internal Medicine Resident On-Call. If you need medication refills, please notify your pharmacy one week in advance and they will send [place] a request.   Thank you for letting us take part in your care. Wishing you the best!  Steffanie Rainwater, MD 06/10/2022, 10:28 AM IM Resident, PGY-3 [name] 41:10

## 2022-06-15 NOTE — Progress Notes (Signed)
Internal Medicine Clinic Attending  Case discussed with Dr. Amponsah  At the time of the visit.  We reviewed the resident's history and exam and pertinent patient test results.  I agree with the assessment, diagnosis, and plan of care documented in the resident's note.  

## 2022-07-08 ENCOUNTER — Other Ambulatory Visit: Payer: Self-pay | Admitting: Dietician

## 2022-07-08 DIAGNOSIS — E109 Type 1 diabetes mellitus without complications: Secondary | ICD-10-CM

## 2022-07-12 NOTE — Telephone Encounter (Addendum)
Hinton says he has not heard or talked to anyone about getting an InPen. Per [employer]; they need the following information: [phone];  [phone]  They said they are not enrolled with [place] Medicaid so will process as denied and allow him to get voucher for 35$. Patient notified and needs cartridges sent to [facility] to use with the Inpen. He was advised to make an appointment with me once he has both the Inpen and the cartridges

## 2022-07-21 MED ORDER — INSULIN ASPART 100 UNIT/ML CARTRIDGE (PENFILL): SUBCUTANEOUS | 11 refills | Status: DC

## 2022-07-28 ENCOUNTER — Telehealth: Payer: Self-pay

## 2022-07-28 NOTE — Telephone Encounter (Signed)
Pa for pt ( INPEN  ) came though via fax from [facility]  was submitted with last office notes and labs to [place] .Marland Kitchen Awaiting approval or denial .        UPDATE :       [employer] send the approval or denial  to patient home directly  and the pharmacy  with in a few days or week

## 2022-08-11 IMAGING — CR DG CHEST 2V
2 series · 2 of 2 positions shown · non-contrast
Comparison: 09/04/2016

CLINICAL DATA: Cough and congestion.

EXAM:
CHEST - 2 VIEW

[w chest pa]
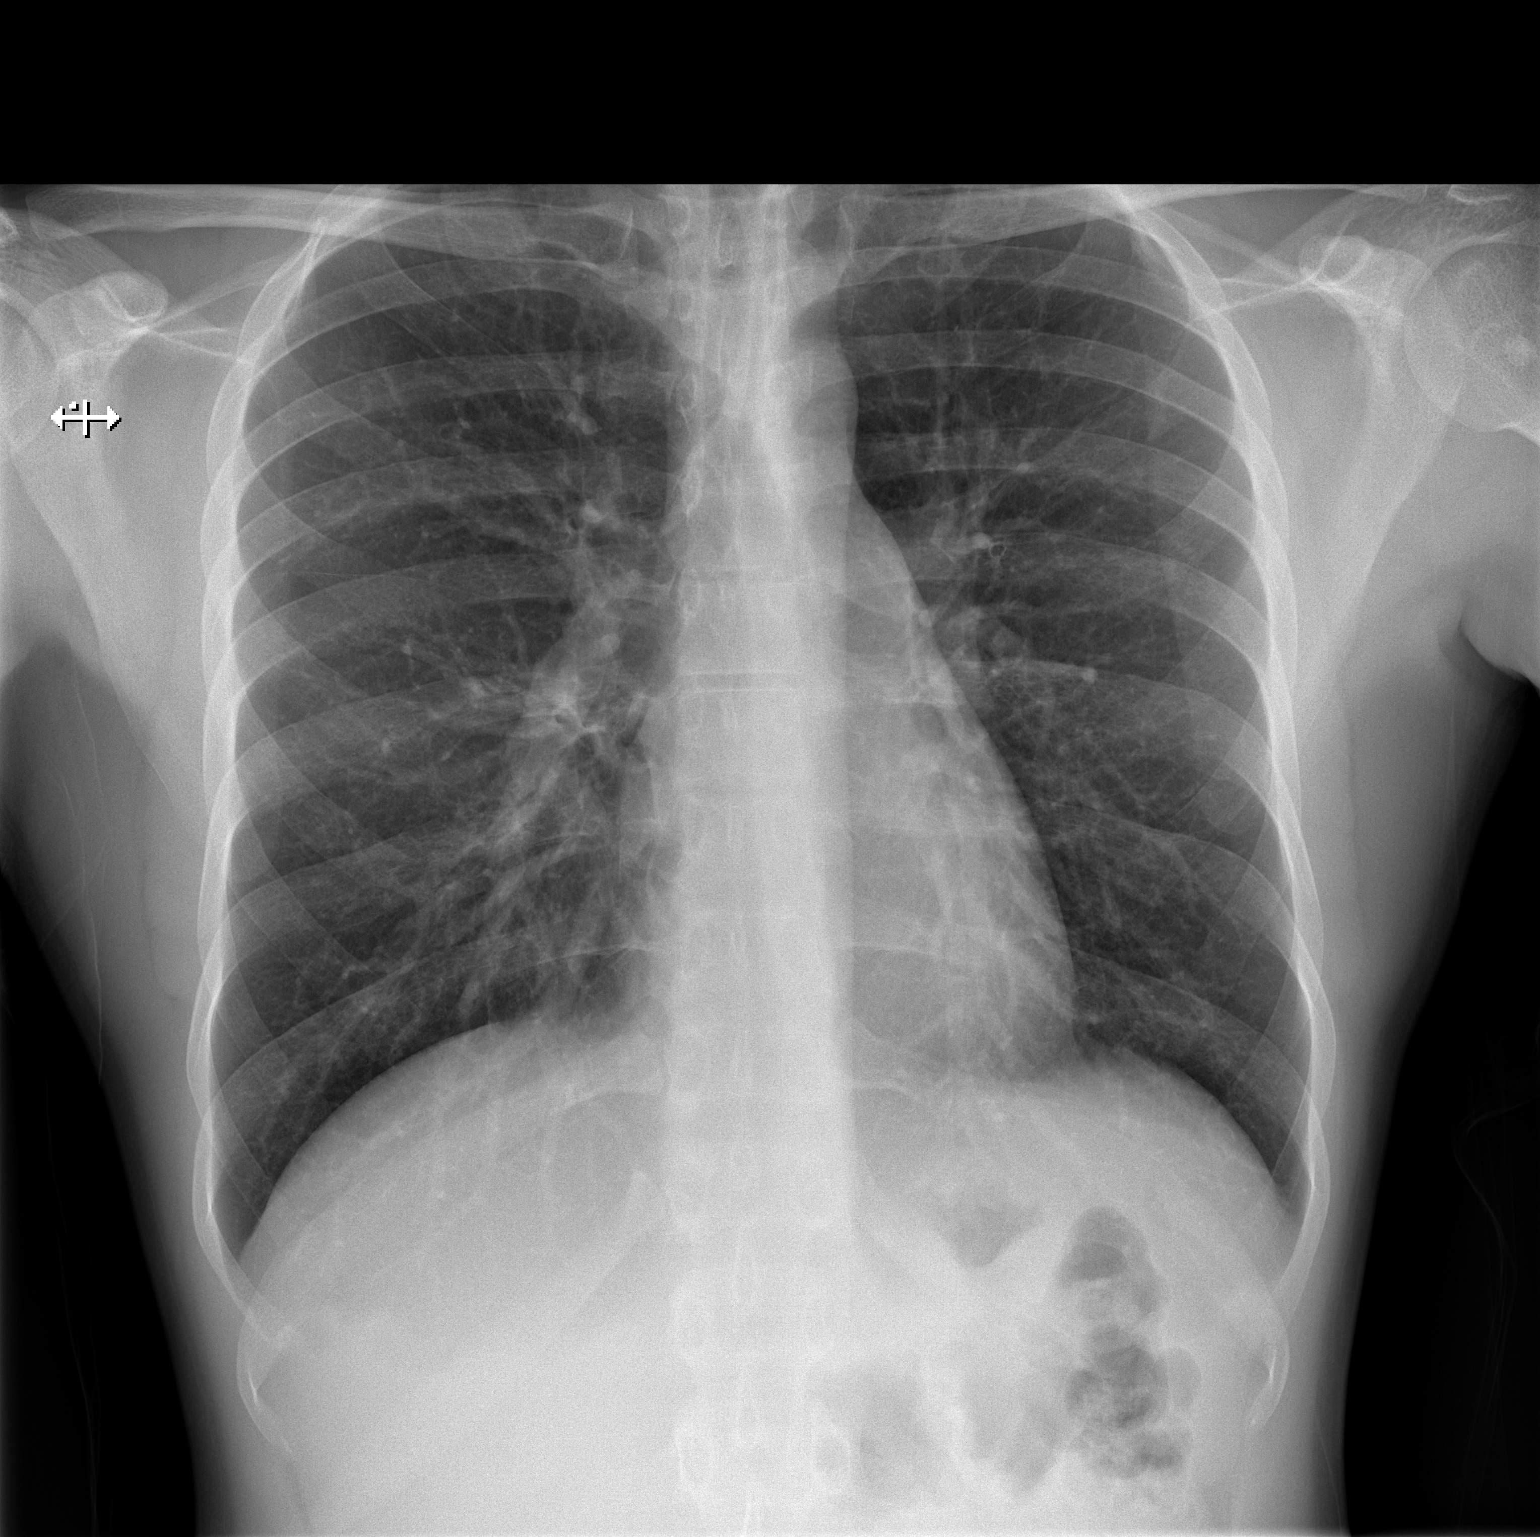

[w chest lat]
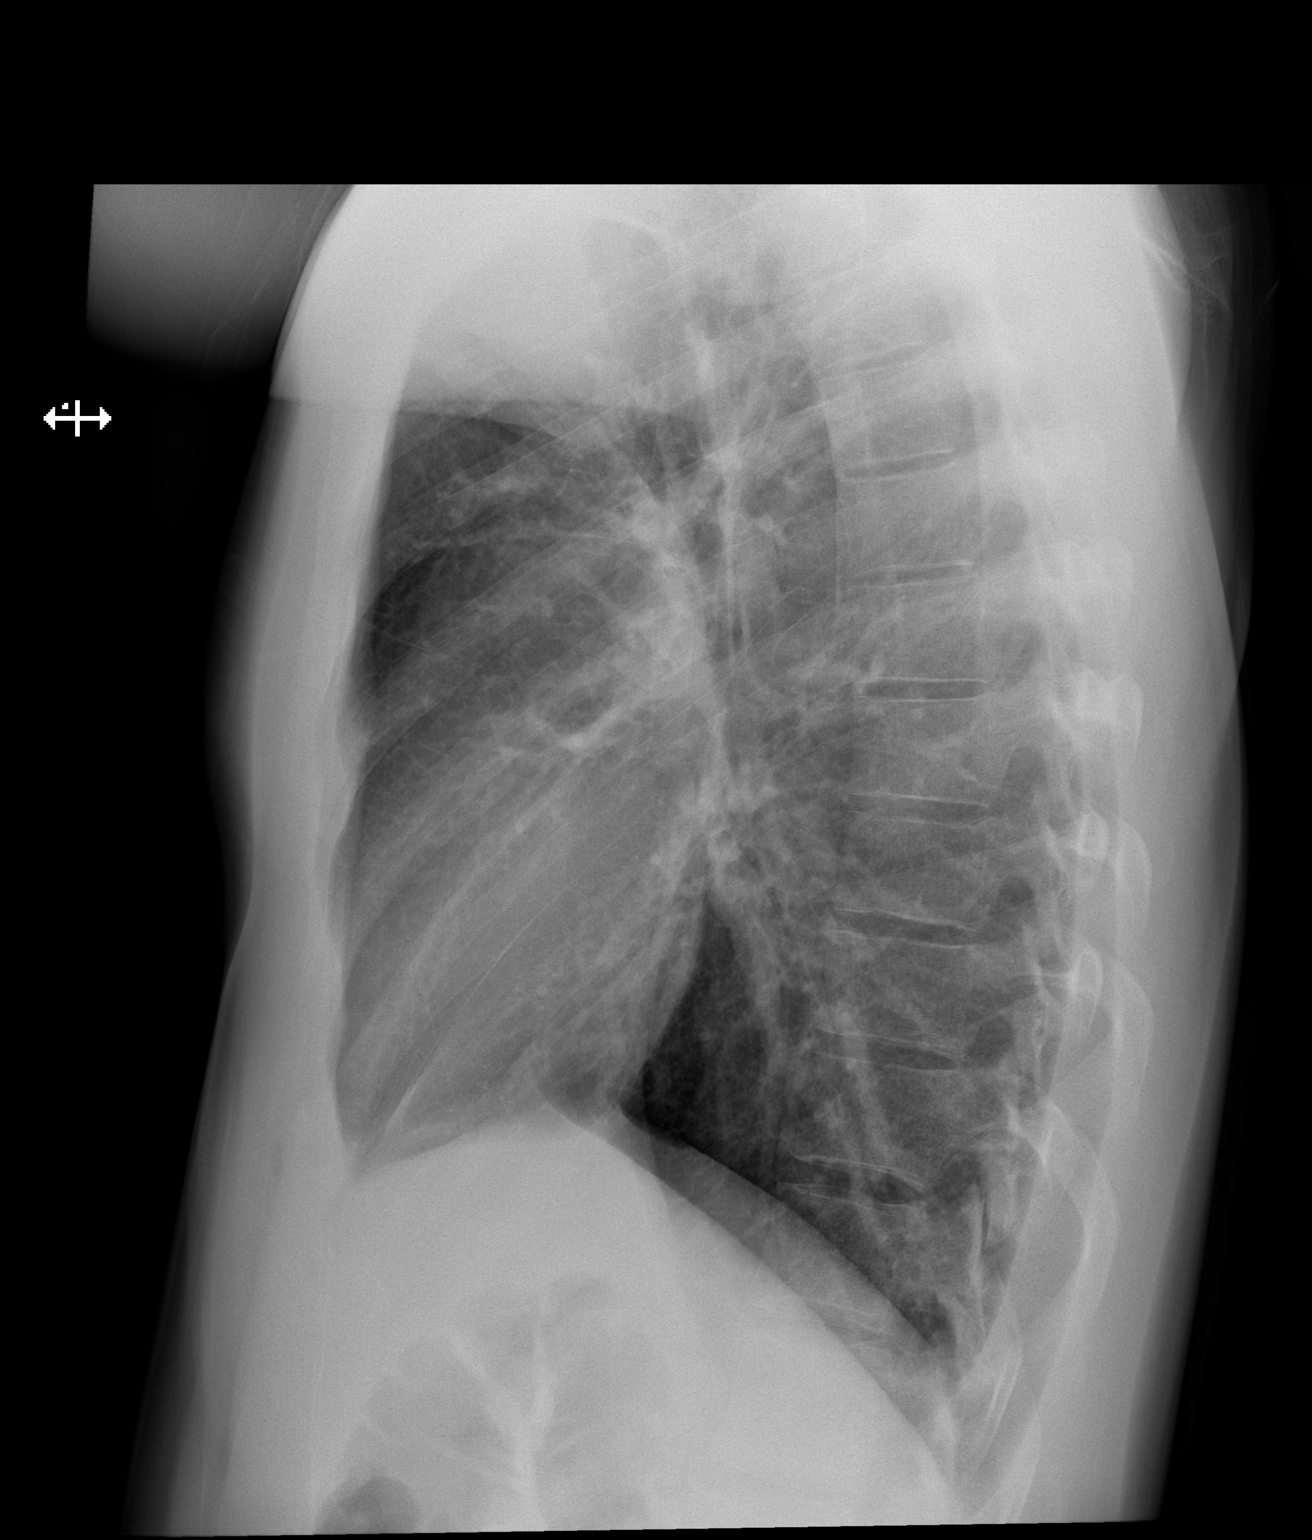

[2 of 2 positions shown; findings below may reference images not displayed]

FINDINGS: The lungs are clear without focal pneumonia, edema, pneumothorax or
pleural effusion. The cardiopericardial silhouette is within normal
limits for size. The visualized bony structures of the thorax show
no acute abnormality.
IMPRESSION: No active cardiopulmonary disease.

## 2022-08-26 ENCOUNTER — Ambulatory Visit (HOSPITAL_COMMUNITY): Payer: Self-pay

## 2022-08-26 ENCOUNTER — Ambulatory Visit (HOSPITAL_COMMUNITY): Payer: Medicaid Other

## 2022-08-31 ENCOUNTER — Ambulatory Visit (INDEPENDENT_AMBULATORY_CARE_PROVIDER_SITE_OTHER): Payer: Medicaid Other | Admitting: Psychiatry

## 2022-08-31 ENCOUNTER — Ambulatory Visit (INDEPENDENT_AMBULATORY_CARE_PROVIDER_SITE_OTHER): Payer: Medicaid Other | Admitting: *Deleted

## 2022-08-31 ENCOUNTER — Encounter (HOSPITAL_COMMUNITY): Payer: Self-pay

## 2022-08-31 ENCOUNTER — Other Ambulatory Visit (HOSPITAL_COMMUNITY): Payer: Self-pay | Admitting: Psychiatry

## 2022-08-31 VITALS — BP 133/94 | HR 87 | Ht 69.0 in | Wt 139.0 lb

## 2022-08-31 DIAGNOSIS — F411 Generalized anxiety disorder: Secondary | ICD-10-CM | POA: Diagnosis not present

## 2022-08-31 DIAGNOSIS — F251 Schizoaffective disorder, depressive type: Secondary | ICD-10-CM

## 2022-08-31 DIAGNOSIS — F17200 Nicotine dependence, unspecified, uncomplicated: Secondary | ICD-10-CM | POA: Diagnosis not present

## 2022-08-31 DIAGNOSIS — F19959 Other psychoactive substance use, unspecified with psychoactive substance-induced psychotic disorder, unspecified: Secondary | ICD-10-CM | POA: Diagnosis not present

## 2022-08-31 MED ORDER — NICOTINE 21 MG/24HR TD PT24: 21.0000 mg | MEDICATED_PATCH | Freq: Every day | TRANSDERMAL | 3 refills | Status: DC

## 2022-08-31 MED ORDER — HYDROXYZINE HCL 10 MG PO TABS: 10.0000 mg | ORAL_TABLET | Freq: Three times a day (TID) | ORAL | 3 refills | Status: DC | PRN

## 2022-08-31 MED ORDER — INVEGA TRINZA 546 MG/1.75ML IM SUSY: 546.0000 mg | PREFILLED_SYRINGE | INTRAMUSCULAR | 11 refills | Status: DC

## 2022-08-31 MED ORDER — TRAZODONE HCL 100 MG PO TABS
100.0000 mg | ORAL_TABLET | Freq: Every evening | ORAL | 3 refills | Status: DC | PRN
Start: 2022-08-31 — End: 2023-04-25

## 2022-08-31 MED ORDER — OLANZAPINE 5 MG PO TBDP: 5.0000 mg | ORAL_TABLET | Freq: Every day | ORAL | 3 refills | Status: DC

## 2022-08-31 NOTE — Progress Notes (Signed)
In for his Rolland Porter shot 546 mg given today in his R GLUTEAL MUSCLE. He is loud, overly talkative and talking to Probation officer about behaviors in his past when he was manic. He is talking about working three jobs. Discouraged from that as stress can be a destabilizer for his illness. He also will be changed to every 2.5 months for his shot as it seems to run out before three months. He is to see Dr Ronne Binning today too. To return in 70 or so days.

## 2022-08-31 NOTE — Progress Notes (Signed)
BH MD/PA/NP OP Progress Note    08/31/2022 1:58 PM Jacob [name]  MRN:  [id number]  Chief Complaint: "I feel like the medications wears off a week before my next dose"  HPI: 31 year-old male seen today for follow up psychiatric evaluation.  He has a psychiatric history of schizoaffective disorder depressive type, MDD with psychotic features and SI. He is currently being managed on  hydroxyzine 10 mg 3 times daily, Zyprexa 5 mg daily,Trazodone 100 mg nightly as needed, Invega Trinza 561 mg every three months, and Nicoderm CQ 21 mg patches. Today he notes his medications are effective for managing his psychiatric conditions.    Today patient was well-groomed, pleasant, cooperative, engages in conversation, and maintained eye contact.  He informed Probation officer that he feels that a his injection wears off a week before his next dose. Provider recommended patient coming in a week early. He endorsed understanding and agreed. Overall patient notes that he mental health is stable.  He  reports that since starting Zyprexa he no longer experiences AH. Today he denies SI/HI/VAH, mania, or paranoia.    Patient informed provider that his anxiety and depression are well managed.  Provider conducted a GAD-7 and patient scored a 5.  Provider also conducted a PHQ-9 and patient scored a 4.  He endorses adequate appetite and sleep.    Patient informed Probation officer that he continues to work at [employer] and find enjoyment in his job.   Patient notes that he continues to vape marijuana. He also notes that he occasionally smokes cigarettes.   No medication changes made today. Patient will return a week before his next injection to help stabilize mood.  Patient agreeable to continue medication as prescribed.  No other concerns at this time.      Visit Diagnosis:    ICD-10-CM   1. Tobacco dependence  F17.200 nicotine (NICODERM CQ - DOSED IN MG/24 HOURS) 21 mg/24hr patch    2. Schizoaffective disorder, depressive type (HCC)   F25.1 traZODone (DESYREL) 100 MG tablet    OLANZapine zydis (ZYPREXA ZYDIS) 5 MG disintegrating tablet    Paliperidone Palmitate ER (INVEGA TRINZA) 546 MG/1.75ML injection    3. Generalized anxiety disorder  F41.1 hydrOXYzine (ATARAX) 10 MG tablet      Past Psychiatric History: Schizoaffective disorder depressive type, MDD with psychotic features and SI  Past Medical History:  Past Medical History:  Diagnosis Date   Bipolar 1 disorder ([place])    History of attempted suicide 2019   Unsuccessful suicide attempt in 2019 with rat poison   Schizoaffective disorder ([place])    Type 1 diabetes mellitus on insulin therapy ([place]) 2019   No past surgical history on file.  Family Psychiatric History: Unknown  Family History:  Family History  Problem Relation Age of Onset   Healthy Mother    Healthy Father     Social History:  Social History   Socioeconomic History   Marital status: Single    Spouse name: Not on file   Number of children: Not on file   Years of education: Not on file   Highest education level: Not on file  Occupational History   Not on file  Tobacco Use   Smoking status: Some Days    Types: Cigarettes   Smokeless tobacco: Never   Tobacco comments:    2 BLACK AND MILD A DAY  Vaping Use   Vaping Use: Never used  Substance and Sexual Activity   Alcohol use: No   Drug use:  Yes    Types: Marijuana   Sexual activity: Not on file  Other Topics Concern   Not on file  Social History Narrative   Not on file   Social Determinants of Health   Financial Resource Strain: Not on file  Food Insecurity: Not on file  Transportation Needs: Not on file  Physical Activity: Not on file  Stress: Not on file  Social Connections: Not on file    Allergies: No Known Allergies  Metabolic Disorder Labs: Lab Results  Component Value Date   HGBA1C 9.4 (A) 06/10/2022   MPG >398 07/28/2020   MPG 294.83 06/08/2020   No results found for: "PROLACTIN" Lab Results  Component  Value Date   CHOL 202 (H) 03/03/2022   TRIG 54 03/03/2022   HDL 67 03/03/2022   CHOLHDL 3.0 03/03/2022   VLDL 8 11/16/2019   LDLCALC 125 (H) 03/03/2022   LDLCALC 91 11/16/2019   Lab Results  Component Value Date   TSH 1.568 11/16/2019   TSH 1.277 02/06/2018    Therapeutic Level Labs: No results found for: "LITHIUM" No results found for: "VALPROATE" No results found for: "CBMZ"  Current Medications: Current Outpatient Medications  Medication Sig Dispense Refill   Paliperidone Palmitate ER (INVEGA TRINZA) 546 MG/1.75ML injection Inject 1.75 mLs (546 mg total) into the muscle every 3 (three) months. 1.8 mL 11   Continuous Blood Gluc Receiver (DEXCOM G6 RECEIVER) DEVI 1 each by Continuous infusion (non-IV) route See admin instructions. Use as directed for continuous blood glucose monitoring.     Continuous Blood Gluc Sensor (DEXCOM G6 SENSOR) MISC Replace every 10 days. Use to monitor blood sugar continuously 9 each 3   Continuous Blood Gluc Transmit (DEXCOM G6 TRANSMITTER) MISC Use as directed for continuous glucose monitoring. Use Transmitter for 90 days. Discard and replace. 1 each 3   glucagon 1 MG injection Inject 1 mg into the skin See admin instructions. Follow package directions for low blood sugar. 10 each 1   hydrOXYzine (ATARAX) 10 MG tablet Take 1 tablet (10 mg total) by mouth 3 (three) times daily as needed for anxiety. 90 tablet 3   injection device for insulin (INPEN 100-GREY-NOVOLOG-FIASP) DEVI Use with Novolog cartridges to administer Novolog insulin 1 each 1   insulin aspart (NOVOLOG FLEXPEN) 100 UNIT/ML FlexPen Inject 6 units before breakfast, 6 units before lunch, 4 units before dinner and 4 units before snack 15 mL 2   insulin aspart (NOVOLOG) cartridge Use with Inpen to administer Novolog insulin before eating meals and snacks as directed 15 mL 11   insulin degludec (TRESIBA FLEXTOUCH) 100 UNIT/ML FlexTouch Pen Inject 20 Units into the skin daily. 3 mL 15   Insulin  Pen Needle 32G X 4 MM MISC Use to inject insulin 4 times a day 360 each 3   nicotine (NICODERM CQ - DOSED IN MG/24 HOURS) 21 mg/24hr patch Place 1 patch (21 mg total) onto the skin daily. 28 patch 3   OLANZapine zydis (ZYPREXA ZYDIS) 5 MG disintegrating tablet Take 1 tablet (5 mg total) by mouth at bedtime. 30 tablet 3   traZODone (DESYREL) 100 MG tablet Take 1 tablet (100 mg total) by mouth at bedtime as needed for sleep. 30 tablet 3   Current Facility-Administered Medications  Medication Dose Route Frequency Provider Last Rate Last Admin   Paliperidone Palmitate ER SUSY 561 mg  561 mg Intramuscular Q90 days Rankin, Shuvon B, NP         Musculoskeletal: Strength & Muscle Tone:  within normal limits Gait & Station: normal Patient leans: N/A  Psychiatric Specialty Exam: Review of Systems  There were no vitals taken for this visit.There is no height or weight on file to calculate BMI.  General Appearance: Well Groomed  Eye Contact:  Good  Speech:  Clear and Coherent and Normal Rate  Volume:  Normal  Mood:  Euthymic  Affect:  Appropriate and Congruent  Thought Process:  Coherent, Goal Directed and Linear  Orientation:  Full (Time, Place, and Person)  Thought Content: WDL and Logical   Suicidal Thoughts:  No  Homicidal Thoughts:  No  Memory:  Immediate;   Good Recent;   Good Remote;   Good  Judgement:  Good  Insight:  Good  Psychomotor Activity:  Normal  Concentration:  Concentration: Good and Attention Span: Good  Recall:  Good  Fund of Knowledge: Good  Language: Good  Akathisia:  No  Handed:  Left  AIMS (if indicated): Not done  Assets:  Communication Skills Desire for Improvement Financial Resources/Insurance Housing Social Support  ADL's:  Intact  Cognition: WNL  Sleep:  Good   Screenings: [place] Office Visit from 08/31/2022 in [facility] Office Visit from 05/11/2022 in [facility] Office  Visit from 01/28/2022 in [facility] Office Visit from 07/30/2021 in [facility] Admission (Discharged) from 06/06/2020 in [place] [facility]  AIMS Total Score 0 0 0 0 0      AUDIT    Flowsheet Row Admission (Discharged) from 06/06/2020 in [place] [facility] Admission (Discharged) from OP Visit from 11/15/2019 in [facility] Admission (Discharged) from 02/04/2018 in [place] [facility]  Alcohol Use Disorder Identification Test Final Score (AUDIT) 0 2 0      GAD-7    Flowsheet Row Office Visit from 08/31/2022 in [facility] Office Visit from 05/11/2022 in [facility] Office Visit from 01/28/2022 in [place] from 04/23/2021 in [facility] Video Visit from 01/27/2021 in [facility]  Total GAD-7 Score 5 0 0 6 8      PHQ2-9    [place] Office Visit from 08/31/2022 in [facility] Office Visit from 06/10/2022 in [place] Office Visit from 05/11/2022 in [facility] Office Visit from 04/19/2022 in [place] Office Visit from 03/03/2022 in [place]  PHQ-2 Total Score 1 0 0 0 0  PHQ-9 Total Score 4 -- -- -- --      [place] Visit from 05/11/2022 in [facility] ED from 05/09/2022 in [place] DEPT Office Visit from 01/28/2022 in [place] No Risk No Risk No Risk        Assessment and Plan: Patient notes that he is doing well his current medication regimen. He does notes that he feels that his injection wears off a week  before it is due.  No medication changes made today. Patient will return a week before his next injection to help stabilize mood.  Patient agreeable to continue medication as prescribed.  1. Schizoaffective disorder, depressive type ([place])  Continue- traZODone (DESYREL) 100 MG tablet; Take 1 tablet (100 mg total)  by mouth at bedtime as needed for sleep.  Dispense: 30 tablet; Refill: 3 Continue- OLANZapine zydis (ZYPREXA ZYDIS) 5 MG disintegrating tablet; Take 1 tablet (5 mg total) by mouth at bedtime.  Dispense: 30 tablet; Refill: 3 Continue- Paliperidone Palmitate ER (INVEGA TRINZA) 546 MG/1.75ML injection; Inject 1.75 mLs (546 mg total) into the muscle every 3 (three) months.  Dispense: 1.8 mL; Refill: 11  2. Generalized anxiety disorder  Continue- hydrOXYzine (ATARAX) 10 MG tablet; Take 1 tablet (10 mg total) by mouth 3 (three) times daily as needed for anxiety.  Dispense: 90 tablet; Refill: 3  3. Tobacco dependence  Continue- nicotine (NICODERM CQ - DOSED IN MG/24 HOURS) 21 mg/24hr patch; Place 1 patch (21 mg total) onto the skin daily.  Dispense: 28 patch; Refill: 3   Follow up in 3 months  Salley Slaughter, NP 08/31/2022, 1:58 PM

## 2022-09-28 ENCOUNTER — Ambulatory Visit (HOSPITAL_COMMUNITY): Payer: Medicaid Other

## 2022-10-05 ENCOUNTER — Other Ambulatory Visit: Payer: Self-pay | Admitting: Dietician

## 2022-10-05 DIAGNOSIS — E109 Type 1 diabetes mellitus without complications: Secondary | ICD-10-CM

## 2022-10-05 NOTE — Telephone Encounter (Signed)
Needs order for Inpen resent.

## 2022-10-06 MED ORDER — INPEN 100-GREY-NOVOLOG-FIASP DEVI: 1 refills | Status: DC

## 2022-11-10 ENCOUNTER — Encounter (HOSPITAL_COMMUNITY): Payer: Self-pay | Admitting: Psychiatry

## 2022-11-10 ENCOUNTER — Ambulatory Visit (INDEPENDENT_AMBULATORY_CARE_PROVIDER_SITE_OTHER): Payer: Medicaid Other | Admitting: Psychiatry

## 2022-11-10 ENCOUNTER — Encounter (HOSPITAL_COMMUNITY): Payer: Self-pay

## 2022-11-10 VITALS — BP 118/69 | HR 92 | Resp 18 | Ht 69.0 in | Wt 147.0 lb

## 2022-11-10 DIAGNOSIS — F1210 Cannabis abuse, uncomplicated: Secondary | ICD-10-CM

## 2022-11-10 DIAGNOSIS — F1290 Cannabis use, unspecified, uncomplicated: Secondary | ICD-10-CM | POA: Diagnosis not present

## 2022-11-10 DIAGNOSIS — F251 Schizoaffective disorder, depressive type: Secondary | ICD-10-CM

## 2022-11-10 DIAGNOSIS — F411 Generalized anxiety disorder: Secondary | ICD-10-CM | POA: Diagnosis not present

## 2022-11-10 DIAGNOSIS — Z789 Other specified health status: Secondary | ICD-10-CM

## 2022-11-10 DIAGNOSIS — F1010 Alcohol abuse, uncomplicated: Secondary | ICD-10-CM | POA: Diagnosis not present

## 2022-11-10 NOTE — Progress Notes (Addendum)
BH MD/PA/NP OP Progress Note    11/10/2022 2:11 PM Jacob [name]  MRN:  [id number]  Chief Complaint: "I am doing well"  HPI: 32 year-old male seen today in [facility]. He has a psychiatric history of schizoaffective disorder depressive type, MDD with psychotic features and GAD. He has been getting Invega Trinza 561 mg every three months. He was recommended to get Trinza every 70+days due to early wearing off its effect. Today,  He notes his medications are effective for managing his psychiatric conditions.    Today patient is pleasant, cooperative, engages in conversation, and maintained eye contact.  He reports that he has been doing well on LAI.  He denies any depression, anxiety or manic symptoms but does sometimes feels overwhelmed.  He reports that he is not taking Zyprexa or hydroxyzine.  He does take trazodone sometimes to help with sleep.  He denies any side effects and has been tolerating it well.  He denies any muscle spasm, stiffness or tremors.  Today he denies SI/HI/VAH, mania, or paranoia.  He endorses adequate appetite and sleep.    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  Educated about the negative effects of marijuana.  Recommended to cut down on alcohol and completely stop marijuana.  He is not interested in any resources or Inpatient or outpatient substance abuse treatment.  Discussed that he should follow-up with PCP for DM management and get repeat EKG done.  He verbalizes understanding.  No other concerns at this time.   No stiffness, tremors or involuntary movement on exam.     Visit Diagnosis:    ICD-10-CM   1. Schizoaffective disorder, depressive type ([place])  F25.1 EKG 12-Lead    2. Generalized anxiety disorder  F41.1     3. Cannabis abuse  F12.10     4. Alcohol abuse  F10.10         Past Psychiatric History: Schizoaffective  disorder depressive type, MDD with psychotic features and SI  Past Medical History:  Past Medical History:  Diagnosis Date   Bipolar 1 disorder ([place])    History of attempted suicide 2019   Unsuccessful suicide attempt in 2019 with rat poison   Schizoaffective disorder ([place])    Type 1 diabetes mellitus on insulin therapy ([place]) 2019   No past surgical history on file.  Family Psychiatric History: Unknown  Family History:  Family History  Problem Relation Age of Onset   Healthy Mother    Healthy Father     Social History:  Social History   Socioeconomic History   Marital status: Single    Spouse name: Not on file   Number of children: Not on file   Years of education: Not on file   Highest education level: Not on file  Occupational History   Not on file  Tobacco Use   Smoking status: Some Days    Types: Cigarettes   Smokeless tobacco: Never   Tobacco comments:    2 BLACK AND MILD A DAY  Vaping Use   Vaping Use: Never used  Substance and Sexual Activity   Alcohol use: No   Drug use: Yes    Types: Marijuana   Sexual activity: Not on file  Other Topics Concern   Not on file  Social History Narrative   Not on file   Social Determinants of Health  Financial Resource Strain: Not on file  Food Insecurity: Not on file  Transportation Needs: Not on file  Physical Activity: Not on file  Stress: Not on file  Social Connections: Not on file    Allergies: No Known Allergies  Metabolic Disorder Labs: Lab Results  Component Value Date   HGBA1C 9.4 (A) 06/10/2022   MPG >398 07/28/2020   MPG 294.83 06/08/2020   No results found for: "PROLACTIN" Lab Results  Component Value Date   CHOL 202 (H) 03/03/2022   TRIG 54 03/03/2022   HDL 67 03/03/2022   CHOLHDL 3.0 03/03/2022   VLDL 8 11/16/2019   LDLCALC 125 (H) 03/03/2022   LDLCALC 91 11/16/2019   Lab Results  Component Value Date   TSH 1.568 11/16/2019   TSH 1.277 02/06/2018    Therapeutic Level Labs: No  results found for: "LITHIUM" No results found for: "VALPROATE" No results found for: "CBMZ"  Current Medications: Current Outpatient Medications  Medication Sig Dispense Refill   Continuous Blood Gluc Receiver (DEXCOM G6 RECEIVER) [place] 1 each by Continuous infusion (non-IV) route See admin instructions. Use as directed for continuous blood glucose monitoring.     Continuous Blood Gluc Sensor (DEXCOM G6 SENSOR) MISC Replace every 10 days. Use to monitor blood sugar continuously 9 each 3   Continuous Blood Gluc Transmit (DEXCOM G6 TRANSMITTER) MISC Use as directed for continuous glucose monitoring. Use Transmitter for 90 days. Discard and replace. 1 each 3   glucagon 1 MG injection Inject 1 mg into the skin See admin instructions. Follow package directions for low blood sugar. 10 each 1   injection device for insulin (INPEN 100-GREY-NOVOLOG-FIASP) DEVI Use with Novolog cartridges to administer Novolog insulin 1 each 1   insulin aspart (NOVOLOG FLEXPEN) 100 UNIT/ML FlexPen Inject 6 units before breakfast, 6 units before lunch, 4 units before dinner and 4 units before snack 15 mL 2   insulin aspart (NOVOLOG) cartridge Use with Inpen to administer Novolog insulin before eating meals and snacks as directed 15 mL 11   insulin degludec (TRESIBA FLEXTOUCH) 100 UNIT/ML FlexTouch Pen Inject 20 Units into the skin daily. 3 mL 15   Insulin Pen Needle 32G X 4 MM MISC Use to inject insulin 4 times a day 360 each 3   nicotine (NICODERM CQ - DOSED IN MG/24 HOURS) 21 mg/24hr patch Place 1 patch (21 mg total) onto the skin daily. 28 patch 3   Paliperidone Palmitate ER (INVEGA TRINZA) 546 MG/1.75ML injection Inject 1.75 mLs (546 mg total) into the muscle every 3 (three) months. 1.8 mL 11   traZODone (DESYREL) 100 MG tablet Take 1 tablet (100 mg total) by mouth at bedtime as needed for sleep. 30 tablet 3   Current Facility-Administered Medications  Medication Dose Route Frequency Provider Last Rate Last Admin    Paliperidone Palmitate ER SUSY 561 mg  561 mg Intramuscular Q90 days Rankin, Shuvon B, NP         Musculoskeletal: Strength & Muscle Tone: within normal limits Gait & Station: normal Patient leans: N/A  Psychiatric Specialty Exam: Review of Systems  There were no vitals taken for this visit.There is no height or weight on file to calculate BMI.  General Appearance: Casual  Eye Contact:  Good  Speech:  Clear and Coherent and Normal Rate  Volume:  Normal  Mood:  Euthymic  Affect:  Appropriate and Congruent  Thought Process:  Coherent, Goal Directed and Linear  Orientation:  Full (Time, Place, and Person)  Thought Content:  WDL and Logical   Suicidal Thoughts:  No  Homicidal Thoughts:  No  Memory:  Immediate;   Good Recent;   Good Remote;   Good  Judgement:  Good  Insight:  Good  Psychomotor Activity:  Normal  Concentration:  Concentration: Good and Attention Span: Good  Recall:  Good  Fund of Knowledge: Good  Language: Good  Akathisia:  No  Handed:  Left  AIMS (if indicated): 0  Assets:  Communication Skills Desire for Improvement Financial Resources/Insurance Housing Social Support  ADL's:  Intact  Cognition: WNL  Sleep:  Good   Screenings: [place] Office Visit from 08/31/2022 in [facility] Office Visit from 05/11/2022 in [facility] Office Visit from 01/28/2022 in [facility] Office Visit from 07/30/2021 in [facility] Admission (Discharged) from 06/06/2020 in [place] [facility]  AIMS Total Score 0 0 0 0 0      AUDIT    Flowsheet Row Admission (Discharged) from 06/06/2020 in [place] [facility] Admission (Discharged) from OP Visit from 11/15/2019 in [place] [facility] Admission (Discharged) from 02/04/2018 in [place] [facility]  Alcohol  Use Disorder Identification Test Final Score (AUDIT) 0 2 0      GAD-7    Flowsheet Row Office Visit from 08/31/2022 in [facility] Office Visit from 05/11/2022 in [facility] Office Visit from 01/28/2022 in [place] from 04/23/2021 in [facility] Video Visit from 01/27/2021 in [facility]  Total GAD-7 Score 5 0 0 6 8      PHQ2-9    Flowsheet Row Clinical Support from 11/10/2022 in [facility] Office Visit from 08/31/2022 in [facility] Office Visit from 06/10/2022 in [place] Office Visit from 05/11/2022 in [facility] Office Visit from 04/19/2022 in [place]  PHQ-2 Total Score 3 1 0 0 0  PHQ-9 Total Score 10 4 -- -- --      [place] Office Visit from 05/11/2022 in [facility] ED from 05/09/2022 in [place] [facility] Office Visit from 01/28/2022 in [place] No Risk No Risk No Risk        Assessment and Plan: Patient notes that he is doing well his current medication regimen.  He denies any side effects.  He has been noncompliant with Zyprexa and hydroxyzine so we will discontinue.  Pt had last EKG done in 2022.  Will repeat EKG.  Patient will get it done at his PCP office. Labs reviewed from 2023-HbA1c 9.4, glucose 136, UDS positive for amphetamine and THC, CBC WNL, ethanol less than 10, CMP WNL except glucose 264, lipid profile shows cholesterol 202, LDL 125 Last EKG from July 2022-QTc 408  1. Schizoaffective disorder, depressive type ([place])  Continue- traZODone (DESYREL) 100 MG tablet; Take 1 tablet (100 mg total) by mouth at bedtime as needed for sleep.  Dispense: 30 tablet;  Refill: 3 Stop  OLANZapine zydis (ZYPREXA ZYDIS) due to noncompliance. continue- Paliperidone Palmitate ER (INVEGA TRINZA) 546 MG/1.75ML injection; Inject 1.75 mLs (546 mg total) into the muscle every 3 (three) months.  Patient has been getting it every 70+ days -Repeat EKG  at PCP office.   2. Generalized anxiety disorder  Stop hydrOXYzine (ATARAX) (noncompliant)  3.  Cannabis abuse 4. Alcohol use -Patient declined inpatient /outpatient rehab resources. -Recommend cutting down on alcohol and complete cessation of cannabis.  Follow up in 3 months  Karsten Ro, MD 11/10/2022, 2:11 PM

## 2022-11-10 NOTE — Progress Notes (Incomplete)
BH MD/PA/NP OP Progress Note    11/10/2022 2:02 PM Jacob [name]  MRN:  [id number]  Chief Complaint: "I am doing well"  HPI: 32 year-old male seen today in [facility]. He has a psychiatric history of schizoaffective disorder depressive type, MDD with psychotic features and GAD. He has been getting Invega Trinza 561 mg every three months. He was recommended to get Trinza every 70+days due to early wearing off its effect. Today,  He notes his medications are effective for managing his psychiatric conditions.    Today patient is pleasant, cooperative, engages in conversation, and maintained eye contact.  He reports that he has been doing well on LAI.  He denies any depression, anxiety or manic symptoms but does sometimes feels overwhelmed.  He reports that he is not taking Zyprexa or hydroxyzine.  He does take trazodone sometimes to help with sleep.  He denies any side effects and has been tolerating it well.  He denies any muscle spasm, stiffness or tremors.  Today he denies SI/HI/VAH, mania, or paranoia.  He endorses adequate appetite and sleep.    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  Educated about the negative effects of marijuana.  Recommended to cut down on alcohol and completely stop marijuana.  He is not interested in any resources or Inpatient or outpatient substance abuse treatment.  Discussed that he should follow-up with PCP for DM management and get repeat EKG done.  He verbalizes understanding.  No other concerns at this time.   No stiffness, tremors or involuntary movement on exam.     Visit Diagnosis:    ICD-10-CM   1. Schizoaffective disorder, depressive type ([place])  F25.1     2. Generalized anxiety disorder  F41.1        Past Psychiatric History: Schizoaffective disorder depressive type, MDD with psychotic features and SI  Past Medical  History:  Past Medical History:  Diagnosis Date   Bipolar 1 disorder ([place])    History of attempted suicide 2019   Unsuccessful suicide attempt in 2019 with rat poison   Schizoaffective disorder ([place])    Type 1 diabetes mellitus on insulin therapy ([place]) 2019   No past surgical history on file.  Family Psychiatric History: Unknown  Family History:  Family History  Problem Relation Age of Onset   Healthy Mother    Healthy Father     Social History:  Social History   Socioeconomic History   Marital status: Single    Spouse name: Not on file   Number of children: Not on file   Years of education: Not on file   Highest education level: Not on file  Occupational History   Not on file  Tobacco Use   Smoking status: Some Days    Types: Cigarettes   Smokeless tobacco: Never   Tobacco comments:    2 BLACK AND MILD A DAY  Vaping Use   Vaping Use: Never used  Substance and Sexual Activity   Alcohol use: No   Drug use: Yes    Types: Marijuana   Sexual activity: Not on file  Other Topics Concern   Not on file  Social History Narrative   Not on file   Social Determinants of Health   Financial Resource Strain: Not on file  Food Insecurity: Not on file  Transportation Needs: Not on file  Physical  Activity: Not on file  Stress: Not on file  Social Connections: Not on file    Allergies: No Known Allergies  Metabolic Disorder Labs: Lab Results  Component Value Date   HGBA1C 9.4 (A) 06/10/2022   MPG >398 07/28/2020   MPG 294.83 06/08/2020   No results found for: "PROLACTIN" Lab Results  Component Value Date   CHOL 202 (H) 03/03/2022   TRIG 54 03/03/2022   HDL 67 03/03/2022   CHOLHDL 3.0 03/03/2022   VLDL 8 11/16/2019   LDLCALC 125 (H) 03/03/2022   LDLCALC 91 11/16/2019   Lab Results  Component Value Date   TSH 1.568 11/16/2019   TSH 1.277 02/06/2018    Therapeutic Level Labs: No results found for: "LITHIUM" No results found for: "VALPROATE" No results  found for: "CBMZ"  Current Medications: Current Outpatient Medications  Medication Sig Dispense Refill   Continuous Blood Gluc Receiver (DEXCOM G6 RECEIVER) DEVI 1 each by Continuous infusion (non-IV) route See admin instructions. Use as directed for continuous blood glucose monitoring.     Continuous Blood Gluc Sensor (DEXCOM G6 SENSOR) MISC Replace every 10 days. Use to monitor blood sugar continuously 9 each 3   Continuous Blood Gluc Transmit (DEXCOM G6 TRANSMITTER) MISC Use as directed for continuous glucose monitoring. Use Transmitter for 90 days. Discard and replace. 1 each 3   glucagon 1 MG injection Inject 1 mg into the skin See admin instructions. Follow package directions for low blood sugar. 10 each 1   hydrOXYzine (ATARAX) 10 MG tablet Take 1 tablet (10 mg total) by mouth 3 (three) times daily as needed for anxiety. 90 tablet 3   injection device for insulin (INPEN 100-GREY-NOVOLOG-FIASP) DEVI Use with Novolog cartridges to administer Novolog insulin 1 each 1   insulin aspart (NOVOLOG FLEXPEN) 100 UNIT/ML FlexPen Inject 6 units before breakfast, 6 units before lunch, 4 units before dinner and 4 units before snack 15 mL 2   insulin aspart (NOVOLOG) cartridge Use with Inpen to administer Novolog insulin before eating meals and snacks as directed 15 mL 11   insulin degludec (TRESIBA FLEXTOUCH) 100 UNIT/ML FlexTouch Pen Inject 20 Units into the skin daily. 3 mL 15   Insulin Pen Needle 32G X 4 MM MISC Use to inject insulin 4 times a day 360 each 3   nicotine (NICODERM CQ - DOSED IN MG/24 HOURS) 21 mg/24hr patch Place 1 patch (21 mg total) onto the skin daily. 28 patch 3   OLANZapine zydis (ZYPREXA ZYDIS) 5 MG disintegrating tablet Take 1 tablet (5 mg total) by mouth at bedtime. 30 tablet 3   Paliperidone Palmitate ER (INVEGA TRINZA) 546 MG/1.75ML injection Inject 1.75 mLs (546 mg total) into the muscle every 3 (three) months. 1.8 mL 11   traZODone (DESYREL) 100 MG tablet Take 1 tablet (100  mg total) by mouth at bedtime as needed for sleep. 30 tablet 3   Current Facility-Administered Medications  Medication Dose Route Frequency Provider Last Rate Last Admin   Paliperidone Palmitate ER SUSY 561 mg  561 mg Intramuscular Q90 days Rankin, Shuvon B, NP         Musculoskeletal: Strength & Muscle Tone: within normal limits Gait & Station: normal Patient leans: N/A  Psychiatric Specialty Exam: Review of Systems  Blood pressure 118/69, pulse 92, resp. rate 18, height 5\' 9"  (1.753 m), weight 147 lb (66.7 kg), SpO2 96 %.Body mass index is 21.71 kg/m.  General Appearance: Casual  Eye Contact:  Good  Speech:  Clear and Coherent  and Normal Rate  Volume:  Normal  Mood:  Euthymic  Affect:  Appropriate and Congruent  Thought Process:  Coherent, Goal Directed and Linear  Orientation:  Full (Time, Place, and Person)  Thought Content: WDL and Logical   Suicidal Thoughts:  No  Homicidal Thoughts:  No  Memory:  Immediate;   Good Recent;   Good Remote;   Good  Judgement:  Good  Insight:  Good  Psychomotor Activity:  Normal  Concentration:  Concentration: Good and Attention Span: Good  Recall:  Good  Fund of Knowledge: Good  Language: Good  Akathisia:  No  Handed:  Left  AIMS (if indicated): 0  Assets:  Communication Skills Desire for Improvement Financial Resources/Insurance Housing Social Support  ADL's:  Intact  Cognition: WNL  Sleep:  Good   Screenings: AIMS    Flowsheet Row Office Visit from 08/31/2022 in [facility] Office Visit from [facility]/11/2022 in [facility] Office Visit from 01/28/2022 in [facility] Office Visit from 07/30/2021 in [facility]) [facility]  AIMS Total Score 0 0 0 0 0      AUDIT    Flowsheet Row [facility]) [facility] Ad[facility] (Discharged) from [facility]) [facility]  Alcohol Use Disorder Identification Test Final Score (AUDIT) 0 2 0      GAD-7    Flowsheet Row Office Visit from 08/31/2022 in [facility] Office Visit from 05/11/2022 in [facility] Office Visit from 01/28/2022 in [facility] Clinical Support from 04/23/2021 in [facility] Video Visit from 01/27/2021 in [facility]  Total GAD-7 Score 5 0 0 6 8      PHQ2-9    Flowsheet Row Clinical Support from 11/10/2022 in [facility] Office Visit from 08/31/2022 in [facility] Office Visit from 06/10/2022 in [facility] Office Visit from 05/11/2022 in [facility] Office Visit from 04/19/2022 in [facility]  PHQ-2 Total Score 3 1 0 0 0  PHQ-9 Total Score 10 4 -- -- --      Flowsheet Row Office Visit from 05/11/2022 in [facility] ED from 05/09/2022 in [facility] Office Visit from 01/28/2022 in [facility]  C-SSRS RISK CATEGORY No Risk No Risk No Risk        Assessment and Plan: Patient notes that he is doing well his current medication regimen.  He denies any side effects.  He has been noncompliant with Zyprexa and hydroxyzine so we will discontinue.  Pt had last EKG done in 2022.  Will repeat EKG.  Patient will get it done at his PCP office. Labs reviewed from 2023-HbA1c 9.4, glucose 136, UDS positive for amphetamine and THC, CBC WNL, ethanol less than 10, CMP WNL except glucose 264, lipid profile shows cholesterol 202, LDL 125 Last EKG from  July 2022-QTc 408  1. Schizoaffective disorder, depressive type ([facility])  Continue- traZODone (DESYREL) 100 MG tablet; Take 1 tablet (100 mg total) by mouth at bedtime as needed for sleep.  Dispense: 30 tablet; Refill: 3 Stop  OLANZapine zydis (ZYPREXA  ZYDIS) due to noncompliance. continue- Paliperidone Palmitate ER (INVEGA TRINZA) 546 MG/1.75ML injection; Inject 1.75 mLs (546 mg total) into the muscle every 3 (three) months.  Patient has been getting every 70+ days -Repeat EKG at PCP office.   2. Generalized anxiety disorder  Stop hydrOXYzine (ATARAX) (noncompliant)  Follow up in 3 months  Armando Reichert, MD 11/10/2022, 2:02 PM

## 2022-11-10 NOTE — Progress Notes (Signed)
PATIENT PRESENTS TO OFFICE FOR INVEGA TRINZA INJECTION 546 MG/ 1.75 ML, WAS GIVEN INTO THE LEFT UPPER GLUTEAL AREA  BY Jacob [name] CMA. HE WILL RETURN IN THREE MONTHS.   PATIENT SEEMS TO BE DOING WELL!

## 2022-11-17 ENCOUNTER — Encounter: Payer: Self-pay | Admitting: Student

## 2022-11-17 ENCOUNTER — Ambulatory Visit (HOSPITAL_COMMUNITY): Payer: Medicaid Other | Attending: Internal Medicine

## 2022-11-17 ENCOUNTER — Ambulatory Visit: Payer: Medicaid Other | Admitting: Student

## 2022-11-17 VITALS — BP 133/88 | HR 74 | Ht 69.0 in | Wt 145.6 lb

## 2022-11-17 DIAGNOSIS — F17210 Nicotine dependence, cigarettes, uncomplicated: Secondary | ICD-10-CM

## 2022-11-17 DIAGNOSIS — T50905A Adverse effect of unspecified drugs, medicaments and biological substances, initial encounter: Secondary | ICD-10-CM | POA: Diagnosis not present

## 2022-11-17 DIAGNOSIS — F333 Major depressive disorder, recurrent, severe with psychotic symptoms: Secondary | ICD-10-CM | POA: Diagnosis not present

## 2022-11-17 DIAGNOSIS — I4581 Long QT syndrome: Secondary | ICD-10-CM | POA: Diagnosis present

## 2022-11-17 DIAGNOSIS — E109 Type 1 diabetes mellitus without complications: Secondary | ICD-10-CM | POA: Diagnosis present

## 2022-11-17 DIAGNOSIS — F250 Schizoaffective disorder, bipolar type: Secondary | ICD-10-CM

## 2022-11-17 LAB — GLUCOSE, CAPILLARY: Glucose-Capillary: 267 mg/dL — ABNORMAL HIGH (ref 70–99)

## 2022-11-17 LAB — POCT GLYCOSYLATED HEMOGLOBIN (HGB A1C): HbA1c POC (<> result, manual entry): >14 % — AB (ref 4.0–5.6)

## 2022-11-17 MED ORDER — DEXCOM G6 TRANSMITTER MISC: 3 refills | Status: DC

## 2022-11-17 MED ORDER — DEXCOM G6 RECEIVER DEVI: 1.0000 | 3 refills | Status: DC

## 2022-11-17 MED ORDER — TRESIBA FLEXTOUCH 100 UNIT/ML ~~LOC~~ SOPN: 25.0000 [IU] | PEN_INJECTOR | Freq: Every day | SUBCUTANEOUS | 15 refills | Status: DC

## 2022-11-17 MED ORDER — NOVOLOG FLEXPEN 100 UNIT/ML ~~LOC~~ SOPN: PEN_INJECTOR | SUBCUTANEOUS | 2 refills | Status: DC

## 2022-11-17 MED ORDER — DEXCOM G6 SENSOR MISC: 3 refills | Status: DC

## 2022-11-17 NOTE — Patient Instructions (Addendum)
Thank you, [name] for allowing [place] to provide your care today. Today we discussed your diabetes, schizoaffective disorder, and anxiety.  Your A1c is significantly elevated, >14. I highly recommend that you stick to the insulin regimen that we have discussed. I am going up on your long-acting as well as the short acting.  I am also refilling your Dexcom meter and will want you to follow-up with me in 2 weeks.  I have ordered the following labs for you:   Lab Orders         Glucose, capillary         Microalbumin / Creatinine Urine Ratio         POC Hbg A1C      I will call if any are abnormal. All of your labs can be accessed through "My Chart".   I have ordered the following medication/changed the following medications:  Increase Tresiba to 25 units daily Increase NovoLog to 7 units 3 times daily with meals  My Chart Access: [email or web address]  Please follow-up in 2 weeks after placing Dexcom meter  Please make sure to arrive 15 minutes prior to your next appointment. If you arrive late, you may be asked to reschedule.    We look forward to seeing you next time. Please call our clinic at [phone] if you have any questions or concerns. The best time to call is Monday-Friday from 9am-4pm, but there is someone available 24/7. If after hours or the weekend, call the main hospital number and ask for the Internal Medicine Resident On-Call. If you need medication refills, please notify your pharmacy one week in advance and they will send [place] a request.   Thank you for letting us take part in your care. Wishing you the best!  Lacinda Axon, MD 11/17/2022, 11:42 AM IM Resident, PGY-3 [place]al[place] Milian 41:10

## 2022-11-17 NOTE — Progress Notes (Signed)
CC: Diabetes  HPI:  [name] is a 32 y.o. male with PMH as below who presents to clinic accompanied by his grandmother to follow-up on his diabetes. Please see problem based charting for evaluation, assessment and plan.  Past Medical History:  Diagnosis Date   Bipolar 1 disorder ([place])    History of attempted suicide 2019   Unsuccessful suicide attempt in 2019 with rat poison   Schizoaffective disorder ([place])    Type 1 diabetes mellitus on insulin therapy ([place]) 2019    Review of Systems:  Constitutional: Positive for polydipsia. Negative for syncope or fatigue Eyes: Negative for visual changes Cardiac: Negative for chest pain Abdomen: Negative for abdominal pain, constipation or diarrhea Neuro: Negative for headache, dizziness or weakness  Physical Exam: General: Pleasant, young Black male. No acute distress. Cardiac: RRR. No murmurs, rubs or gallops. No LE edema Respiratory: Lungs CTAB. No wheezing or crackles. Skin: Warm, dry and intact without rashes or lesions Extremities: Atraumatic. Full ROM. Palpable radial and DP pulses. Neuro: A&O x 3. Moves all extremities Psych: Appropriate mood and affect.  Vitals:   11/17/22 1052  BP: 133/88  Pulse: 74  SpO2: 100%  Weight: 145 lb 9.6 oz (66 kg)  Height: 5\' 9"  (1.753 m)    Assessment & Plan:   Insulin dependent diabetes mellitus type [facility]) Patient here accompanied by grandmother for a 62-month follow up on his diabetes. Due to difficulty controlling patient's blood sugar, our diabetic coordinator, Jacob [name] suggested the Novolog Inpen device to help both patient track his blood sugars and and administer the appropriate amount. Patient states the company called but he was not with his grandmother so he did not have a number that was requested. He also states that his Dexcom receiver stopped working 2-3 months ago. According to grandmother, patient now eats whatever he wants and has been eating more fast foods and foods  high in sugar and carbs. He also smokes marijuana, drinks alcohol and does not listen to his grandmother's advice. Patient admits to this and states he will try to do better. His A1c is >14% today. Informs me he is injecting 20 units of his Jacob [name] daily and 5 units of his Novolog TID with meals. Discussed our plan to increase his insulin regimen, sent him new Dexcom supplies and have him follow up in two weeks to re-evaluate his blood sugars. MA/CR within normal limits.   Plan: -Increase Tresiba from 20 to 25 units daily -Increase Novolog from 5 to 7 units TID w/ meals -New prescriptions fo Dexcom receiver, sensor and transmitter -Will reach out to Jacob [name] to help assist with [employer] Inpen again -Follow up in 2 weeks after applying Dexcom -Repeat A1c in 3 months  Schizoaffective disorder [facility]) Patient follows with behavior health for management of his psychiatric problems. He was recently evaluated 1 week ago and was advised to stop using marijuana and drinking alcohol as these two substances can affect the management of his psychiatric disorder. Patient states he will work on this but he mostly uses these substances when hanging out with his friends. Jacob [name] has been showed to cause modest increase in Qtc so an EKG was completed today and it did not show prolonged Qtc.   Plan -Continue Invega injection every 3 months -Continue trazodone 100 mg at bedtime PRN for sleep -Continue to follow-up with behavioral  MDD (major depressive disorder), recurrent, severe, with psychosis ([place]) PHQ-2 score was 0 today. Patient has been closely followed by Behavior health.  Reports he just started a new job at [employer] and he likes it because he does not have work weekends.  -Continue to follow up with [facility]    See Encounters Tab for problem based charting.  Patient discussed with Dr. Lockie Pares, MD, MPH

## 2022-11-18 ENCOUNTER — Encounter: Payer: Self-pay | Admitting: Student

## 2022-11-18 LAB — MICROALBUMIN / CREATININE URINE RATIO
Creatinine, Urine: 87.4 mg/dL
Microalb/Creat Ratio: <3 mg/g creat (ref 0–29)
Microalbumin, Urine: <3 ug/mL

## 2022-11-18 NOTE — Assessment & Plan Note (Signed)
>>  ASSESSMENT AND PLAN FOR UNCONTROLLED TYPE 1 DIABETES MELLITUS WITH HYPOGLYCEMIA WITHOUT COMA (HCC) WRITTEN ON 11/18/2022 11:09 PM BY AMPONSAH, CLARETTA HERO, MD  Patient here accompanied by grandmother for a 77-month follow up on his diabetes. Due to difficulty controlling patient's blood sugar, our diabetic coordinator, [name] suggested the Novolog  Inpen device to help both patient track his blood sugars and and administer the appropriate amount. Patient states the company called but he was not with his grandmother so he did not have a number that was requested. He also states that his Dexcom receiver stopped working 2-3 months ago. According to grandmother, patient now eats whatever he wants and has been eating more fast foods and foods high in sugar and carbs. He also smokes marijuana, drinks alcohol and does not listen to his grandmother's advice. Patient admits to this and states he will try to do better. His A1c is >14% today. Informs me he is injecting 20 units of his Tresiba  daily and 5 units of his Novolog  TID with meals. Discussed our plan to increase his insulin  regimen, sent him new Dexcom supplies and have him follow up in two weeks to re-evaluate his blood sugars. MA/CR within normal limits.   Plan: -Increase Tresiba  from 20 to 25 units daily -Increase Novolog  from 5 to 7 units TID w/ meals -New prescriptions fo Dexcom receiver, sensor and transmitter -Will reach out to [name] to help assist with Novolog  Smart Inpen again -Follow up in 2 weeks after applying Dexcom -Repeat A1c in 3 months

## 2022-11-18 NOTE — Assessment & Plan Note (Addendum)
Patient here accompanied by grandmother for a 61-month follow up on his diabetes. Due to difficulty controlling patient's blood sugar, our diabetic coordinator, Jacob [name] suggested the Novolog Inpen device to help both patient track his blood sugars and and administer the appropriate amount. Patient states the company called but he was not with his grandmother so he did not have a number that was requested. He also states that his Dexcom receiver stopped working 2-3 months ago. According to grandmother, patient now eats whatever he wants and has been eating more fast foods and foods high in sugar and carbs. He also smokes marijuana, drinks alcohol and does not listen to his grandmother's advice. Patient admits to this and states he will try to do better. His A1c is >14% today. Informs me he is injecting 20 units of his Jacob [name] daily and 5 units of his Novolog TID with meals. Discussed our plan to increase his insulin regimen, sent him new Dexcom supplies and have him follow up in two weeks to re-evaluate his blood sugars. MA/CR within normal limits.   Plan: -Increase Tresiba from 20 to 25 units daily -Increase Novolog from 5 to 7 units TID w/ meals -New prescriptions fo Dexcom receiver, sensor and transmitter -Will reach out to Jacob [name] to help assist with [employer] Inpen again -Follow up in 2 weeks after applying Dexcom -Repeat A1c in 3 months

## 2022-11-18 NOTE — Assessment & Plan Note (Signed)
Patient follows with behavior health for management of his psychiatric problems. He was recently evaluated 1 week ago and was advised to stop using marijuana and drinking alcohol as these two substances can affect the management of his psychiatric disorder. Patient states he will work on this but he mostly uses these substances when hanging out with his friends. Lorayne Bender has been showed to cause modest increase in Qtc so an EKG was completed today and it did not show prolonged Qtc.   Plan -Continue Invega injection every 3 months -Continue trazodone 100 mg at bedtime PRN for sleep -Continue to follow-up with behavioral

## 2022-11-18 NOTE — Assessment & Plan Note (Signed)
PHQ-2 score was 0 today. Patient has been closely followed by Behavior health. Reports he just started a new job at [employer] and he likes it because he does not have work weekends.  -Continue to follow up with [facility]

## 2022-11-19 NOTE — Progress Notes (Signed)
Internal Medicine Clinic Attending  Case discussed with Dr. Amponsah  At the time of the visit.  We reviewed the resident's history and exam and pertinent patient test results.  I agree with the assessment, diagnosis, and plan of care documented in the resident's note.  

## 2022-12-14 ENCOUNTER — Other Ambulatory Visit: Payer: Self-pay | Admitting: Student

## 2022-12-14 DIAGNOSIS — E109 Type 1 diabetes mellitus without complications: Secondary | ICD-10-CM

## 2022-12-14 NOTE — Telephone Encounter (Signed)
Thanks. Let's see if the insurance approves or denies it before we made a change.

## 2023-01-04 ENCOUNTER — Telehealth: Payer: Self-pay

## 2023-01-04 NOTE — Telephone Encounter (Signed)
Prior Authorization for patient (Dexcom g6 Reciever) came through via fax from [facility], has been submitted with last office notes and labs faxed to [phone] Approval or denial will be sent to patients home address.

## 2023-02-09 ENCOUNTER — Ambulatory Visit (HOSPITAL_COMMUNITY): Payer: Medicaid Other

## 2023-02-10 ENCOUNTER — Other Ambulatory Visit (HOSPITAL_COMMUNITY): Payer: Self-pay

## 2023-02-11 ENCOUNTER — Telehealth: Payer: Self-pay

## 2023-02-11 NOTE — Telephone Encounter (Signed)
Rec's a prior auth request for patients Tresiba insulin. Medicaid preferred alternatives are Levemir and Lantus.   Unless patient has a contraindication to the preferred medications, could one of the alternatives be sent to his pharmacy?

## 2023-02-11 NOTE — Telephone Encounter (Signed)
Patient has been on [place] since a year ago. Did something change or did they changed the formulary?  If the PA is denied we can certainly try him on Levemir or Lantus but I will prefer for him to stay on Tresiba. Thanks.

## 2023-02-14 ENCOUNTER — Other Ambulatory Visit (HOSPITAL_COMMUNITY): Payer: Self-pay

## 2023-02-16 ENCOUNTER — Encounter (HOSPITAL_COMMUNITY): Payer: Self-pay | Admitting: Registered Nurse

## 2023-02-16 ENCOUNTER — Ambulatory Visit (HOSPITAL_COMMUNITY): Payer: Medicaid Other

## 2023-02-16 ENCOUNTER — Encounter (HOSPITAL_COMMUNITY): Payer: Self-pay

## 2023-02-16 ENCOUNTER — Ambulatory Visit (INDEPENDENT_AMBULATORY_CARE_PROVIDER_SITE_OTHER): Payer: Medicaid Other | Admitting: Registered Nurse

## 2023-02-16 ENCOUNTER — Ambulatory Visit (INDEPENDENT_AMBULATORY_CARE_PROVIDER_SITE_OTHER): Payer: Medicaid Other | Admitting: *Deleted

## 2023-02-16 VITALS — BP 126/85 | HR 79 | Ht 69.0 in | Wt 147.0 lb

## 2023-02-16 DIAGNOSIS — F411 Generalized anxiety disorder: Secondary | ICD-10-CM | POA: Diagnosis not present

## 2023-02-16 DIAGNOSIS — G4700 Insomnia, unspecified: Secondary | ICD-10-CM

## 2023-02-16 DIAGNOSIS — F251 Schizoaffective disorder, depressive type: Secondary | ICD-10-CM

## 2023-02-16 DIAGNOSIS — F250 Schizoaffective disorder, bipolar type: Secondary | ICD-10-CM

## 2023-02-16 MED ORDER — PALIPERIDONE PALMITATE ER 156 MG/ML IM SUSY
156.0000 mg | PREFILLED_SYRINGE | Freq: Once | INTRAMUSCULAR | Status: AC
  Administered 2023-02-16: 156 mg via INTRAMUSCULAR

## 2023-02-16 MED ORDER — PALIPERIDONE PALMITATE ER 234 MG/1.5ML IM SUSY: 234.0000 mg | PREFILLED_SYRINGE | Freq: Once | INTRAMUSCULAR | Status: DC

## 2023-02-16 NOTE — Progress Notes (Cosign Needed)
Patient decided/requested to go back on -- paliperidone (INVEGA SUSTENNA) injection 234 mg Tolerated Well in Right-Arm --Loading Dose Stated that the -- Paliperidone Palmitate ER (INVEGA TRINZA) injection 546 mg wasn't helping him Q 3 month injection didn't last long enough & wasn't told he could come as early as 2.5 months  Did make sure that he could return earlier than q 28 days if needed as long not earlier than  q 21 days. His Grandmother & Mother was thankful to know that especially since earlier that day he was stating that he no longer wanted to do injections any longer.  Patient stated he is bored & restless since he's not working @ this time   02/16/23--Patient will return for Maintenance Dose -- paliperidone (INVEGA SUSTENNA) injection 156 mg

## 2023-02-16 NOTE — Patient Instructions (Signed)
Patient decided/requested to go back on -- paliperidone (INVEGA SUSTENNA) injection 234 mg Tolerated Well in Right-Arm Stated that the -- Paliperidone Palmitate ER (INVEGA TRINZA) injection 546 mg wasn't helping him Q 3 month injection didn't last long enough & wasn't told he could come as early as 2.5 months  Did make sure that he could return earlier than q 28 days if needed as long not earlier than  q 21 days. His Grandmother & Mother was thankful to know that especially since earlier that day he was stating that he no longer wanted to do injections any longer.  Patient stated he is bored & restless since he's not working @ this time

## 2023-02-16 NOTE — Progress Notes (Signed)
BH MD/PA/NP OP Progress Note  02/16/2023 3:40 PM Jacob [name]  MRN:  [id number]  Chief Complaint:  Chief Complaint  Patient presents with   Follow-up    Psychiatric evaluation and administration of long acting injectable    HPI: Jacob [name] 48 yr. old male seen today face to face in office today for follow up psychiatric visit and administration of long acting injectable.  His psychiatric history includes schizoaffective disorder depressive type, major depressive disorder with psychotic features, general anxiety disorder, and cannabis use disorder.  He is currently managed on Invega Trinza 546 mg every 3 months and Trazodone 100 mg at bed time as needed.  He reports that the [place] wears off before it is time for his next injection.  Last Eyvonne Mechanic was given on 11/10/2022.  It was suggested that injection be given in 70 days instead of 90 days.  Today Jacob [name] reports he did better with the monthly injection [place].  He reports he is currently hearing voices "I'm hearing a lil bit.  Just talking about my life, what's been happening, and people I've seen.  I be like whatever.  They don't bother me.  I can still function."  He also states that he has had some depression feeling down and isolating himself at times.  Reports he is no longer working related to "It was to many people at work.  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  So, I quit.  I been look for jobs at other places though."  He reports that he smokes marijuana about 3 times a week.  "When ever my friend is around."  Educated about the negative effects of marijuana.  States he is going to try quitting.  Matan denies anxiety, suicidal/self-harm/homicidal ideation, and paranoia.  Provider conducted AIMS, PHQ 2 & 9, C-SSRS, and GAD 7 screenings see scores below.  He reports he is eating and sleeping with no problem.   Discussed switching back to [place] today since he felt he was doing better with monthly injection.  He is agreeable to restarting monthly Invega.  Consulted with Dr. Nelly Rout a loading dose of Invega Sustenna 156 mg will be given today and Jama will return in one week (Wednesday 02/23/2023) for second injection of Invega Sustenna 234 mg.  He can then be scheduled for monthly visits at shot clinic to receive Invega Sustenna 156 mg monthly.  He voiced understanding.  Visit Diagnosis:    ICD-10-CM   1. Schizoaffective disorder, bipolar type  F25.0 paliperidone (INVEGA SUSTENNA) injection 156 mg    paliperidone (INVEGA SUSTENNA) injection 234 mg    2. Generalized anxiety disorder  F41.1     3. Insomnia, unspecified type  G47.00      Past Psychiatric History: schizoaffective disorder depressive type, major depressive disorder with psychotic features, general anxiety disorder, and cannabis use disorder  Past Medical History:  Past Medical History:  Diagnosis Date   Bipolar 1 disorder (HCC)    History of attempted suicide 2019   Unsuccessful suicide attempt in 2019 with rat poison   Schizoaffective disorder (HCC)    Type 1 diabetes mellitus on insulin therapy (HCC) 2019   No past surgical history on file.  Family Psychiatric History: None reported  Family History:  Family History  Problem Relation Age of Onset   Healthy Mother  Healthy Father     Social History:  Social History   Socioeconomic History   Marital status: Single    Spouse name: Not on file   Number of children: Not on file   Years of education: Not on file   Highest education level: Not on file  Occupational History   Not on file  Tobacco Use   Smoking status: Some Days    Types: Cigarettes   Smokeless tobacco: Never   Tobacco comments:    2 BLACK AND MILD A DAY  Vaping Use   Vaping Use: Never used  Substance and Sexual Activity   Alcohol use: No   Drug use: Yes    Types: Marijuana    Sexual activity: Not on file  Other Topics Concern   Not on file  Social History Narrative   Not on file   Social Determinants of Health   Financial Resource Strain: Not on file  Food Insecurity: Not on file  Transportation Needs: Not on file  Physical Activity: Not on file  Stress: Not on file  Social Connections: Not on file    Allergies: No Known Allergies  Metabolic Disorder Labs: Lab Results  Component Value Date   HGBA1C >14.0 (A) 11/17/2022   MPG >398 07/28/2020   MPG 294.83 06/08/2020   No results found for: "PROLACTIN" Lab Results  Component Value Date   CHOL 202 (H) 03/03/2022   TRIG 54 03/03/2022   HDL 67 03/03/2022   CHOLHDL 3.0 03/03/2022   VLDL 8 11/16/2019   LDLCALC 125 (H) 03/03/2022   LDLCALC 91 11/16/2019   Lab Results  Component Value Date   TSH 1.568 11/16/2019   TSH 1.277 02/06/2018    Therapeutic Level Labs: No results found for: "LITHIUM" No results found for: "VALPROATE" No results found for: "CBMZ"  Current Medications: Current Outpatient Medications  Medication Sig Dispense Refill   Continuous Blood Gluc Receiver (DEXCOM G6 RECEIVER) DEVI 1 EACH BY CONTINUOUS INFUSION (NON-IV) ROUTE SEE ADMIN INSTRUCTIONS. USE AS DIRECTED FOR CONTINUOUS BLOOD GLUCOSE MONITORING. 1 each 3   Continuous Blood Gluc Sensor (DEXCOM G6 SENSOR) MISC Replace every 10 days. Use to monitor blood sugar continuously 9 each 3   Continuous Blood Gluc Transmit (DEXCOM G6 TRANSMITTER) MISC Use as directed for continuous glucose monitoring. Use Transmitter for 90 days. Discard and replace. 1 each 3   glucagon 1 MG injection Inject 1 mg into the skin See admin instructions. Follow package directions for low blood sugar. 10 each 1   injection device for insulin (INPEN 100-GREY-NOVOLOG-FIASP) DEVI Use with Novolog cartridges to administer Novolog insulin 1 each 1   insulin aspart (NOVOLOG FLEXPEN) 100 UNIT/ML FlexPen Inject 7 units before breakfast, lunch and dinner 15 mL 2    insulin aspart (NOVOLOG) cartridge Use with Inpen to administer Novolog insulin before eating meals and snacks as directed 15 mL 11   insulin degludec (TRESIBA FLEXTOUCH) 100 UNIT/ML FlexTouch Pen Inject 25 Units into the skin daily. 3 mL 15   Insulin Pen Needle 32G X 4 MM MISC Use to inject insulin 4 times a day 360 each 3   nicotine (NICODERM CQ - DOSED IN MG/24 HOURS) 21 mg/24hr patch Place 1 patch (21 mg total) onto the skin daily. 28 patch 3   traZODone (DESYREL) 100 MG tablet Take 1 tablet (100 mg total) by mouth at bedtime as needed for sleep. 30 tablet 3   Current Facility-Administered Medications  Medication Dose Route Frequency Provider Last Rate Last Admin  paliperidone (INVEGA SUSTENNA) injection 156 mg  156 mg Intramuscular Once Kinlie Janice B, NP       [START ON 02/23/2023] paliperidone (INVEGA SUSTENNA) injection 234 mg  234 mg Intramuscular Once Tyeisha Dinan B, NP         Musculoskeletal: Strength & Muscle Tone: within normal limits Gait & Station: normal Patient leans: N/A  Psychiatric Specialty Exam: Review of Systems  Psychiatric/Behavioral:  Positive for hallucinations. Negative for agitation, behavioral problems, self-injury, sleep disturbance and suicidal ideas. The patient is not nervous/anxious.   All other systems reviewed and are negative.   There were no vitals taken for this visit.There is no height or weight on file to calculate BMI.  General Appearance: Casual  Eye Contact:  Good  Speech:  Clear and Coherent and Normal Rate  Volume:  Normal  Mood:  Euthymic  Affect:  Appropriate and Congruent  Thought Process:  Coherent, Goal Directed, and Descriptions of Associations: Intact  Orientation:  Full (Time, Place, and Person)  Thought Content: Logical and Hallucinations: Auditory   Suicidal Thoughts:  No  Homicidal Thoughts:  No  Memory:  Immediate;   Good Recent;   Good Remote;   Good  Judgement:  Intact  Insight:  Present  Psychomotor  Activity:  Normal  Concentration:  Concentration: Good and Attention Span: Good  Recall:  Good  Fund of Knowledge: Good  Language: Good  Akathisia:  No  Handed:  Right  AIMS (if indicated): done  Assets:  Communication Skills Desire for Improvement Housing Leisure Time Physical Health Social Support  ADL's:  Intact  Cognition: WNL  Sleep:  Good   Screenings: AIMS    Flowsheet Row Office Visit from 02/16/2023 in [facility] Office Visit from 08/31/2022 in [facility] Office Visit from 05/11/2022 in [facility] Office Visit from 01/28/2022 in [facility] Office Visit from 07/30/2021 in [facility]  AIMS Total Score 0 0 0 0 0      AUDIT    Flowsheet Row [facility]) [facility] Ad[facility] (Discharged) from [facility] Ad[facility] (Dis[facility]) [facility]  Alcohol Use Disorder Identification Test Final Score (AUDIT) 0 2 0      GAD-7    Flowsheet Row Office Visit from 02/16/2023 in [facility] Office Visit from 08/31/2022 in [facility] Office Visit from 05/11/2022 in [facility] Office Visit from 01/28/2022 in [facility] Clinical Support from 04/23/2021 in [facility]  Total GAD-7 Score 1 5 0 0 6      PHQ2-9    Flowsheet Row Office Visit from 02/16/2023 in [facility] Office Visit from 11/17/2022 in [facility] Clinical Support from 11/10/2022 in [facility] Office Visit from 08/31/2022 in [facility] Office Visit from  06/10/2022 in [facility]  PHQ-2 Total Score 0 0 3 1 0  PHQ-9 Total Score 3 -- 10 4 --      Flowsheet Row Office Visit from 02/16/2023 in [facility] Office Visit from 05/11/2022 in [facility] ED from 05/09/2022 in [facility] Emergency Department at [facility]  C-SSRS RISK CATEGORY No  Risk No Risk No Risk      Assessment and Plan: Gaspard reports that he is doing well other than periods of depression/isolation and the auditory hallucinations.  He is agreeable to switching back to [place] (monthly) injection since he did better with it instead of [place].  He was given Hinda Glatter Sustenna 156 mg IM today and will return next week 02/23/2023 for injection of 234 mg and then to the scheduled 156 mg monthly.  [place] agreed and understanding voiced.   Labs will also need to be drawn next week.  He reported that labs would be done at his PCP office but nothing noted on chart review other HgbA1c.  Will need CBC/Diff, CMP, Lipid profile, prolactin, magnesium, and TSH. EKG results 11/17/2022 Vent. rate 61 BPM PR interval 154 ms QRS duration 90 ms QT/QTcB 392/394 ms P-R-T axes 74 69 60 Normal sinus rhythm Normal ECG When compared with ECG of 05-May-2021 13:45, since last tracing no significant change.  Confirmed by Lance Muss [phone]) on 11/17/2022 11:19:40 PM  Collaboration of Care: Collaboration of Care: Medication Management AEB administration of long acting and medication refills and Psychiatrist AEB shot clinic staff and PCP  Patient/Guardian was advised Release of Information must be obtained prior to any record release in order to collaborate their care with an outside provider. Patient/Guardian was advised if they have not already done so to contact the registration department to sign all necessary forms in order for [place] to release information regarding their care.   Consent:  Patient/Guardian gives verbal consent for treatment and assignment of benefits for services provided during this visit. Patient/Guardian expressed understanding and agreed to proceed.    Kem Parcher, NP 02/16/2023, 3:40 PM

## 2023-02-23 ENCOUNTER — Ambulatory Visit (HOSPITAL_COMMUNITY): Payer: Medicaid Other

## 2023-02-25 ENCOUNTER — Ambulatory Visit: Payer: Medicaid Other | Admitting: Student

## 2023-02-25 VITALS — BP 123/74 | HR 108 | Temp 97.6°F | Ht 69.0 in | Wt 142.4 lb

## 2023-02-25 DIAGNOSIS — E109 Type 1 diabetes mellitus without complications: Secondary | ICD-10-CM | POA: Diagnosis not present

## 2023-02-25 DIAGNOSIS — F250 Schizoaffective disorder, bipolar type: Secondary | ICD-10-CM | POA: Diagnosis not present

## 2023-02-25 LAB — GLUCOSE, CAPILLARY
Glucose-Capillary: 53 mg/dL — ABNORMAL LOW (ref 70–99)
Glucose-Capillary: 198 mg/dL — ABNORMAL HIGH (ref 70–99)

## 2023-02-25 LAB — POCT GLYCOSYLATED HEMOGLOBIN (HGB A1C): Hemoglobin A1C: 12.7 % — AB (ref 4.0–5.6)

## 2023-02-25 MED ORDER — NOVOLOG FLEXPEN 100 UNIT/ML ~~LOC~~ SOPN: PEN_INJECTOR | SUBCUTANEOUS | 2 refills | Status: DC

## 2023-02-25 MED ORDER — INSULIN DETEMIR 100 UNIT/ML FLEXPEN
30.0000 [IU] | Freq: Every day | SUBCUTANEOUS | 5 refills | Status: DC
Start: 2023-02-25 — End: 2023-02-28

## 2023-02-25 MED ORDER — DEXCOM G6 TRANSMITTER MISC: 3 refills | Status: DC

## 2023-02-25 MED ORDER — INSULIN DETEMIR 100 UNIT/ML FLEXPEN: 30.0000 [IU] | Freq: Every day | SUBCUTANEOUS | 5 refills | Status: DC

## 2023-02-25 NOTE — Patient Instructions (Signed)
Thank you, [name] for allowing [place] to provide your care today. Today we discussed your diabetes and schizoaffective disorder. Your A1c is starting to come down so continue taking your insulin as prescribed. I have switched your long-acting insulin to Levemir as [name] is not covered by your insurance. I have sent refills of your transmitter to your pharmacy. For your schizoaffective disorder, please make sure to follow-up with behavioral health for your Invega injection.  I have ordered the following labs for you:   Lab Orders         Glucose, capillary         POC Hbg A1C      I will call if any are abnormal. All of your labs can be accessed through "My Chart".   I have ordered the following medication/changed the following medications:  Discontinue Tresiba Start Levemir 30 units daily  My Chart Access: [email or web address]  Please follow-up in 3 months  Please make sure to arrive 15 minutes prior to your next appointment. If you arrive late, you may be asked to reschedule.    We look forward to seeing you next time. Please call our clinic at [phone] if you have any questions or concerns. The best time to call is Monday-Friday from 9am-4pm, but there is someone available 24/7. If after hours or the weekend, call the main hospital number and ask for the Internal Medicine Resident On-Call. If you need medication refills, please notify your pharmacy one week in advance and they will send [place] a request.   Thank you for letting us take part in your care. Wishing you the best!  Jacob Rainwater, MD 02/25/2023, 11:24 AM IM Resident, PGY-3 Jacob [name] 41:10

## 2023-02-25 NOTE — Telephone Encounter (Signed)
I saw him today and switched him to Levemir.

## 2023-02-25 NOTE — Progress Notes (Signed)
   CC: Diabetes follow-up  HPI:  [name] is a 32 y.o. male with PMH as below who presents to clinic to follow-up on his diabetes. Please see problem based charting for evaluation, assessment and plan.  Past Medical History:  Diagnosis Date   Bipolar 1 disorder (HCC)    History of attempted suicide 2019   Unsuccessful suicide attempt in 2019 with rat poison   Schizoaffective disorder (HCC)    Type 1 diabetes mellitus on insulin therapy (HCC) 2019    Review of Systems:  Constitutional: Negative for fever or fatigue Eyes: Negative for visual changes Respiratory: Negative for shortness of breath Cardiac: Negative for chest pain MSK: Negative for back pain Abdomen: Negative for abdominal pain, constipation or diarrhea Neuro: Negative for headache or weakness  Physical Exam: General: Pleasant, well-appearing young man.  No acute distress. Cardiac: Tachycardic. Regular rhythm. No murmurs, rubs or gallops. No LE edema Respiratory: Lungs CTAB. No wheezing or crackles. Abdominal: Soft, symmetric and non tender. Normal BS. Skin: Warm, dry and intact without rashes or lesions Extremities: Atraumatic. Feet*** Neuro: A&O x 3. Moves all extremities Psych: Appropriate mood and affect.  Vitals:   02/25/23 1041  BP: 123/74  Pulse: (!) 108  Temp: 97.6 F (36.4 C)  TempSrc: Oral  SpO2: 99%  Height: 5\' 9"  (1.753 m)    Diabetes Patient here for follow-up on his diabetes.  His last A1c >14 over 3 months ago. Three months ago, I increased his [name] to 25 units and NovoLog to 7 units 3 times daily with meals.  His A1c today is.  Per Durward Mallard, [employer] now prefers Hospital doctor, Lantus or Levemir.    Schizoaffective disorder Patient followed up with behavioral health on April 17.  He reported having occasional auditory hallucinations. He also informed him that he did better on the monthly Invega injections compared to the every 3 months injections. He continues to smoke  marijuana few times a week. They decided to switch him back to monthly Invega. EKG showed normal sinus with QTc of 394.  He needs lab work to monitor for metabolic syndrome.  Plan: -Continue monthly Invega injections -Continue to follow-up with behavioral health as scheduled   Assessment & Plan:   No problem-specific Assessment & Plan notes found for this encounter.    See Encounters Tab for problem based charting.  Patient discussed with Dr. {NAMES:[id number]::"Butcher","Guilloud","Hoffman","Mullen","Narendra","Raines","Vincent"}  Sharrell Ku, MD, MPH

## 2023-02-27 ENCOUNTER — Encounter: Payer: Self-pay | Admitting: Student

## 2023-02-27 MED ORDER — DEXCOM G6 RECEIVER DEVI: 1.0000 | 3 refills | Status: DC

## 2023-02-27 NOTE — Assessment & Plan Note (Signed)
>>  ASSESSMENT AND PLAN FOR UNCONTROLLED TYPE 1 DIABETES MELLITUS WITH HYPOGLYCEMIA WITHOUT COMA (HCC) WRITTEN ON 02/27/2023 12:47 AM BY AMPONSAH, CLARETTA HERO, MD  Patient here for follow-up on his diabetes. His last A1c >14 over 3 months ago. Three months ago, I increased his Tresiba  to 25 units and NovoLog  to 7 units 3 times daily with meals. His A1c today has improved to 12.7%. Patient informs me that he thought he was supposed to use 28 units of the Tresiba . He has been injecting 28 units instead of the recommended 25 units.  He denies any syncope episodes, dizziness, polyuria or polydipsia. He was able to get his Dexcom sensors but he was unable to refill the transmitter so he has not been monitoring his blood sugars. His grandmother tells me that she occasionally uses her meter to check his blood sugars when he allows her and they are usually high. This morning, patient was found to have blood sugar of 53. He tells me that he did not eat his usual breakfast (cereal) and only ate half a banana after smoking marijuana. He was given juice with improvement in his blood sugars to 198 prior to leaving the clinic. While he A1c has improved, he is still not close to his goal so I will increase his basal insulin  from 28 to 30 units daily. Per Lavern, patient's insurance now prefers Basaglar , Lantus  or Levemir .  Will discontinue his Tresiba  and start Levemir  which patient has used before in the past. Normal foot exam today.   Plan: -Start Levemir  30 units daily -Discontinue Tresiba  -Continue NovoLog  30 units 3 times daily with meals -Refill Dexcom transmitter and receiver -Repeat A1c in 3 months

## 2023-02-27 NOTE — Assessment & Plan Note (Addendum)
Patient here for follow-up on his diabetes. His last A1c >14 over 3 months ago. Three months ago, I increased his [name] to 25 units and NovoLog to 7 units 3 times daily with meals. His A1c today has improved to 12.7%. Patient informs me that he thought he was supposed to use 28 units of the [place]. He has been injecting 28 units instead of the recommended 25 units.  He denies any syncope episodes, dizziness, polyuria or polydipsia. He was able to get his Dexcom sensors but he was unable to refill the transmitter so he has not been monitoring his blood sugars. His grandmother tells me that she occasionally uses her meter to check his blood sugars when he allows her and they are usually high. This morning, patient was found to have blood sugar of 53. He tells me that he did not eat his usual breakfast (cereal) and only ate half a banana after smoking marijuana. He was given juice with improvement in his blood sugars to 198 prior to leaving the clinic. While he A1c has improved, he is still not close to his goal so I will increase his basal insulin from 28 to 30 units daily. Per Durward Mallard, [employer] now prefers Hospital doctor, Lantus or Levemir.  Will discontinue his [name] and start Levemir which patient has used before in the past. Normal foot exam today.   Plan: -Start Levemir 30 units daily -Discontinue Tresiba -Continue NovoLog 30 units 3 times daily with meals -Refill Dexcom transmitter and receiver -Repeat A1c in 3 months

## 2023-02-27 NOTE — Assessment & Plan Note (Signed)
Patient followed up with behavioral health on April 17. He reported having occasional auditory hallucinations. He also informed him that he did better on the monthly Invega injections compared to the every 3 months injections. He continues to smoke marijuana few times a week. They decided to switch him back to monthly Invega. Patient was given a loading dose in the clinic with plan to start the monthly injections on 4/24 however his granddaughter tells me that patient did not go for injection.  I counseled him on the importance of getting these injections to treat his mental health disorder and he informed me that he will go on Monday. EKG on 4/17 showed normal sinus with QTc of 394.  Plan: -Continue monthly Invega injections -Continue to follow-up with behavioral health as scheduled

## 2023-02-28 ENCOUNTER — Other Ambulatory Visit: Payer: Self-pay | Admitting: Student

## 2023-02-28 ENCOUNTER — Telehealth: Payer: Self-pay

## 2023-02-28 ENCOUNTER — Other Ambulatory Visit: Payer: Self-pay

## 2023-02-28 DIAGNOSIS — E109 Type 1 diabetes mellitus without complications: Secondary | ICD-10-CM

## 2023-02-28 MED ORDER — LEVEMIR FLEXTOUCH 100 UNIT/ML ~~LOC~~ SOPN: 30.0000 [IU] | PEN_INJECTOR | Freq: Every day | SUBCUTANEOUS | 5 refills | Status: DC

## 2023-02-28 NOTE — Telephone Encounter (Signed)
Pt's grandmother states [facility] did not received the Rx for Levemir. Please send the Rx for this pt, states he only has one left for today.

## 2023-02-28 NOTE — Telephone Encounter (Signed)
A Prior Authorization was initiated for this patients DEXCOM G6 RECEIVER through [employer].   Confirmation # [id number]  (Submitted under Dr Oswaldo Done)

## 2023-03-03 NOTE — Progress Notes (Signed)
Internal Medicine Clinic Attending  Case discussed with Dr. Kirke Corin  At the time of the visit.  We reviewed the resident's history and exam and pertinent patient test results.  I agree with the assessment, diagnosis, and plan of care documented in the resident's note. Unfortunate that the longer acting insulin degludec won't be covered by his insurance - we'll have to watch for more glucose fluctuations with glargine.  Very difficult to manage diabetes well in context of mental illness and substance use.  Note that in the discussion of the diabetes problem, the word 'granddaughter' was meant to be 'grandmother'.

## 2023-03-03 NOTE — Telephone Encounter (Signed)
Prior Auth for patients medication DEXCOM G6 RECEIVER approved by MEDICA[employer] from 02/28/23 to 08/27/23.

## 2023-03-21 ENCOUNTER — Other Ambulatory Visit: Payer: Self-pay

## 2023-03-21 ENCOUNTER — Encounter (HOSPITAL_COMMUNITY): Payer: Self-pay | Admitting: Emergency Medicine

## 2023-03-21 ENCOUNTER — Ambulatory Visit (HOSPITAL_COMMUNITY)
Admission: EM | Admit: 2023-03-21 | Discharge: 2023-03-21 | Disposition: A | Discharge status: Home / Self Care | Payer: Medicaid Other | Attending: Internal Medicine | Admitting: Internal Medicine

## 2023-03-21 DIAGNOSIS — S91331A Puncture wound without foreign body, right foot, initial encounter: Secondary | ICD-10-CM

## 2023-03-21 MED ORDER — LEVOFLOXACIN 500 MG PO TABS: 500.0000 mg | ORAL_TABLET | Freq: Every day | ORAL | 0 refills | Status: DC

## 2023-03-21 MED ORDER — IBUPROFEN 600 MG PO TABS: 600.0000 mg | ORAL_TABLET | Freq: Four times a day (QID) | ORAL | 0 refills | Status: DC | PRN

## 2023-03-21 NOTE — ED Triage Notes (Signed)
Patient reports stepping on glass yesterday,.  Patient reports he was wearing his shoes at the time.  Patient did not know there was an injury until later when he noticed pain.  There is redness to joint of right great toe.  Patient reports mother tried to pick glass out with a needle  Has not had any medications .  Patient reports he is a diabetic.  Has not checked sugar today.

## 2023-03-21 NOTE — Discharge Instructions (Addendum)
Please take medications as prescribed I do not see any glass in your foot If you have worsening pain, swelling, redness or discharge please return to urgent care to be reevaluated.

## 2023-03-21 NOTE — ED Provider Notes (Signed)
[facility]    CSN: [id number] Arrival date & time: 03/21/23  1115      History   Chief Complaint Chief Complaint  Patient presents with   Foot Injury    HPI Jacob [name] is a 32 y.o. male comes to the urgent care after he sustained a puncture wound from a broken glass yesterday.  No bleeding.  Mild redness of the right great toe.   HPI  Past Medical History:  Diagnosis Date   Bipolar 1 disorder (HCC)    History of attempted suicide 2019   Unsuccessful suicide attempt in 2019 with rat poison   Schizoaffective disorder (HCC)    Type 1 diabetes mellitus on insulin therapy (HCC) 2019    Patient Active Problem List   Diagnosis Date Noted   Generalized anxiety disorder 08/31/2022   Tobacco dependence 08/31/2022   Schizoaffective disorder (HCC) 12/03/2021   Insulin dependent diabetes mellitus type IA (HCC) 06/06/2020   Polycythemia 03/04/2020   MDD (major depressive disorder), recurrent, severe, with psychosis (HCC)     History reviewed. No pertinent surgical history.     Home Medications    Prior to Admission medications   Medication Sig Start Date End Date Taking? Authorizing Provider  ibuprofen (ADVIL) 600 MG tablet Take 1 tablet (600 mg total) by mouth every 6 (six) hours as needed. 03/21/23  Yes Tarez Bowns, Britta Mccreedy, MD  levofloxacin (LEVAQUIN) 500 MG tablet Take 1 tablet (500 mg total) by mouth daily. 03/21/23  Yes Jaksen Fiorella, Britta Mccreedy, MD  Continuous Blood Gluc Sensor (DEXCOM G6 SENSOR) MISC Replace every 10 days. Use to monitor blood sugar continuously 11/17/22   Steffanie Rainwater, MD  Continuous Glucose Receiver (DEXCOM G6 RECEIVER) DEVI 1 each by Continuous infusion (non-IV) route See admin instructions. Use as directed for continuous blood glucose monitoring. 02/27/23   Steffanie Rainwater, MD  Continuous Glucose Transmitter (DEXCOM G6 TRANSMITTER) MISC Use as directed for continuous glucose monitoring. Use Transmitter for 90 days. Discard and replace.  02/25/23   Steffanie Rainwater, MD  glucagon 1 MG injection Inject 1 mg into the skin See admin instructions. Follow package directions for low blood sugar. 06/10/22 06/10/23  Steffanie Rainwater, MD  injection device for insulin (INPEN 100-GREY-NOVOLOG-FIASP) DEVI Use with Novolog cartridges to administer Novolog insulin 10/06/22   Champ Mungo, DO  insulin aspart (NOVOLOG FLEXPEN) 100 UNIT/ML FlexPen Inject 7 units before breakfast, lunch and dinner 02/25/23   Steffanie Rainwater, MD  insulin aspart (NOVOLOG) cartridge Use with Inpen to administer Novolog insulin before eating meals and snacks as directed 07/21/22   Steffanie Rainwater, MD  insulin detemir (LEVEMIR FLEXPEN) 100 UNIT/ML FlexPen INJECT 30 UNITS INTO THE SKIN DAILY 03/02/23   Steffanie Rainwater, MD  Insulin Pen Needle 32G X 4 MM MISC Use to inject insulin 4 times a day 12/28/21   Steffanie Rainwater, MD  nicotine (NICODERM CQ - DOSED IN MG/24 HOURS) 21 mg/24hr patch Place 1 patch (21 mg total) onto the skin daily. 08/31/22   Shanna Cisco, NP  traZODone (DESYREL) 100 MG tablet Take 1 tablet (100 mg total) by mouth at bedtime as needed for sleep. Patient not taking: Reported on 03/21/2023 08/31/22   Shanna Cisco, NP    Family History Family History  Problem Relation Age of Onset   Healthy Mother    Healthy Father     Social History Social History   Tobacco Use   Smoking status: Some Days  Types: Cigarettes   Smokeless tobacco: Never   Tobacco comments:    2 BLACK AND MILD A DAY  Vaping Use   Vaping Use: Never used  Substance Use Topics   Alcohol use: Yes   Drug use: Yes    Types: Marijuana     Allergies   Patient has no known allergies.   Review of Systems Review of Systems As per HPI  Physical Exam Triage Vital Signs ED Triage Vitals [03/21/23 1352]  Enc Vitals Group     BP 123/87     Pulse Rate 83     Resp 18     Temp 97.8 F (36.6 C)     Temp Source Oral     SpO2 98 %     Weight       Height      Head Circumference      Peak Flow      Pain Score      Pain Loc      Pain Edu?      Excl. in GC?    No data found.  Updated Vital Signs BP 123/87 (BP Location: Left Arm)   Pulse 83   Temp 97.8 F (36.6 C) (Oral)   Resp 18   SpO2 98%   Visual Acuity Right Eye Distance:   Left Eye Distance:   Bilateral Distance:    Right Eye Near:   Left Eye Near:    Bilateral Near:     Physical Exam Vitals and nursing note reviewed.  Constitutional:      General: He is not in acute distress.    Appearance: Normal appearance. He is not ill-appearing.  Musculoskeletal:     Comments: Mild erythema of the first MTP of the right foot.  No foreign body seen.  Neurological:     Mental Status: He is alert.      UC Treatments / Results  Labs (all labs ordered are listed, but only abnormal results are displayed) Labs Reviewed - No data to display  EKG   Radiology No results found.  Procedures Procedures (including critical care time)  Medications Ordered in UC Medications - No data to display  Initial Impression / Assessment and Plan / UC Course  I have reviewed the triage vital signs and the nursing notes.  Pertinent labs & imaging results that were available during my care of the patient were reviewed by me and considered in my medical decision making (see chart for details).     1.  Puncture wound on the plantar surface of the right foot: No foreign bodies seen. Levaquin 500 mg orally daily for 5 days Ibuprofen as needed for pain Patient is diabetic with last hemoglobin A1c of 12.7. Return precautions given. Final Clinical Impressions(s) / UC Diagnoses   Final diagnoses:  Puncture wound of right foot, initial encounter     Discharge Instructions      Please take medications as prescribed I do not see any glass in your foot If you have worsening pain, swelling, redness or discharge please return to urgent care to be reevaluated.   ED  Prescriptions     Medication Sig Dispense Auth. Provider   levofloxacin (LEVAQUIN) 500 MG tablet Take 1 tablet (500 mg total) by mouth daily. 5 tablet Kumari Sculley, Britta Mccreedy, MD   ibuprofen (ADVIL) 600 MG tablet Take 1 tablet (600 mg total) by mouth every 6 (six) hours as needed. 30 tablet Rosela Supak, Britta Mccreedy, MD      PDMP  not reviewed this encounter.   Merrilee Jansky, MD 03/21/23 [phone]

## 2023-03-23 ENCOUNTER — Encounter (HOSPITAL_COMMUNITY): Payer: Self-pay

## 2023-03-23 ENCOUNTER — Emergency Department (HOSPITAL_COMMUNITY)
Admission: EM | Admit: 2023-03-23 | Discharge: 2023-03-25 | Disposition: A | Discharge status: Home / Self Care | Payer: Medicaid Other | Attending: Emergency Medicine | Admitting: Emergency Medicine

## 2023-03-23 ENCOUNTER — Other Ambulatory Visit: Payer: Self-pay

## 2023-03-23 DIAGNOSIS — F250 Schizoaffective disorder, bipolar type: Secondary | ICD-10-CM | POA: Diagnosis present

## 2023-03-23 DIAGNOSIS — Z794 Long term (current) use of insulin: Secondary | ICD-10-CM | POA: Insufficient documentation

## 2023-03-23 DIAGNOSIS — Z79899 Other long term (current) drug therapy: Secondary | ICD-10-CM | POA: Insufficient documentation

## 2023-03-23 DIAGNOSIS — F333 Major depressive disorder, recurrent, severe with psychotic symptoms: Secondary | ICD-10-CM | POA: Diagnosis present

## 2023-03-23 DIAGNOSIS — F259 Schizoaffective disorder, unspecified: Secondary | ICD-10-CM | POA: Diagnosis present

## 2023-03-23 DIAGNOSIS — Z1152 Encounter for screening for COVID-19: Secondary | ICD-10-CM | POA: Diagnosis not present

## 2023-03-23 DIAGNOSIS — E1065 Type 1 diabetes mellitus with hyperglycemia: Secondary | ICD-10-CM | POA: Insufficient documentation

## 2023-03-23 DIAGNOSIS — R259 Unspecified abnormal involuntary movements: Secondary | ICD-10-CM | POA: Insufficient documentation

## 2023-03-23 DIAGNOSIS — Z046 Encounter for general psychiatric examination, requested by authority: Secondary | ICD-10-CM | POA: Diagnosis not present

## 2023-03-23 DIAGNOSIS — E876 Hypokalemia: Secondary | ICD-10-CM | POA: Diagnosis not present

## 2023-03-23 LAB — SALICYLATE LEVEL: Salicylate Lvl: <7 mg/dL — ABNORMAL LOW (ref 7.0–30.0)

## 2023-03-23 LAB — CBC WITH DIFFERENTIAL/PLATELET
Abs Immature Granulocytes: 0.01 10*3/uL (ref 0.00–0.07)
Basophils Absolute: 0 10*3/uL (ref 0.0–0.1)
Basophils Relative: 0 %
Eosinophils Absolute: 0 10*3/uL (ref 0.0–0.5)
Eosinophils Relative: 0 %
HCT: 42 % (ref 39.0–52.0)
Hemoglobin: 14.2 g/dL (ref 13.0–17.0)
Immature Granulocytes: 0 %
Lymphocytes Relative: 23 %
Lymphs Abs: 1.7 10*3/uL (ref 0.7–4.0)
MCH: 30 pg (ref 26.0–34.0)
MCHC: 33.8 g/dL (ref 30.0–36.0)
MCV: 88.8 fL (ref 80.0–100.0)
Monocytes Absolute: 0.6 10*3/uL (ref 0.1–1.0)
Monocytes Relative: 7 %
Neutro Abs: 5.1 10*3/uL (ref 1.7–7.7)
Neutrophils Relative %: 70 %
Platelets: 299 10*3/uL (ref 150–400)
RBC: 4.73 MIL/uL (ref 4.22–5.81)
RDW: 12 % (ref 11.5–15.5)
WBC: 7.4 10*3/uL (ref 4.0–10.5)
nRBC: 0 % (ref 0.0–0.2)

## 2023-03-23 LAB — COMPREHENSIVE METABOLIC PANEL
ALT: 14 U/L (ref 0–44)
AST: 15 U/L (ref 15–41)
Albumin: 4.1 g/dL (ref 3.5–5.0)
Alkaline Phosphatase: 56 U/L (ref 38–126)
Anion gap: 9 (ref 5–15)
BUN: 16 mg/dL (ref 6–20)
CO2: 26 mmol/L (ref 22–32)
Calcium: 9.2 mg/dL (ref 8.9–10.3)
Chloride: 100 mmol/L (ref 98–111)
Creatinine, Ser: 0.9 mg/dL (ref 0.61–1.24)
GFR, Estimated: >60 mL/min (ref >60)
Glucose, Bld: 149 mg/dL — ABNORMAL HIGH (ref 70–99)
Potassium: 3.3 mmol/L — ABNORMAL LOW (ref 3.5–5.1)
Sodium: 135 mmol/L (ref 135–145)
Total Bilirubin: 0.6 mg/dL (ref 0.3–1.2)
Total Protein: 7.5 g/dL (ref 6.5–8.1)

## 2023-03-23 LAB — RAPID URINE DRUG SCREEN, HOSP PERFORMED
Amphetamines: NOT DETECTED
Barbiturates: NOT DETECTED
Benzodiazepines: NOT DETECTED
Cocaine: NOT DETECTED
Opiates: NOT DETECTED
Tetrahydrocannabinol: DETECTED — AB

## 2023-03-23 LAB — MAGNESIUM: Magnesium: 1.8 mg/dL (ref 1.7–2.4)

## 2023-03-23 LAB — ETHANOL: Alcohol, Ethyl (B): <10 mg/dL (ref <10)

## 2023-03-23 LAB — ACETAMINOPHEN LEVEL: Acetaminophen (Tylenol), Serum: <10 ug/mL — ABNORMAL LOW (ref 10–30)

## 2023-03-23 MED ORDER — LORAZEPAM 2 MG/ML IJ SOLN: 0.0000 mg | Freq: Four times a day (QID) | INTRAMUSCULAR | Status: DC

## 2023-03-23 MED ORDER — THIAMINE MONONITRATE 100 MG PO TABS
100.0000 mg | ORAL_TABLET | Freq: Every day | ORAL | Status: DC
  Administered 2023-03-23 – 2023-03-25 (×3): 100 mg via ORAL
  Filled 2023-03-23 (×3): qty 1

## 2023-03-23 MED ORDER — POTASSIUM CHLORIDE CRYS ER 20 MEQ PO TBCR
40.0000 meq | EXTENDED_RELEASE_TABLET | Freq: Once | ORAL | Status: AC
  Administered 2023-03-23: 40 meq via ORAL
  Filled 2023-03-23: qty 2

## 2023-03-23 MED ORDER — LORAZEPAM 1 MG PO TABS: 0.0000 mg | ORAL_TABLET | Freq: Four times a day (QID) | ORAL | Status: DC

## 2023-03-23 MED ORDER — THIAMINE HCL 100 MG/ML IJ SOLN: 100.0000 mg | Freq: Every day | INTRAMUSCULAR | Status: DC

## 2023-03-23 MED ORDER — LORAZEPAM 2 MG/ML IJ SOLN: 0.0000 mg | Freq: Two times a day (BID) | INTRAMUSCULAR | Status: DC

## 2023-03-23 MED ORDER — PALIPERIDONE ER 6 MG PO TB24
6.0000 mg | ORAL_TABLET | Freq: Every day | ORAL | Status: DC
  Administered 2023-03-23 – 2023-03-25 (×3): 6 mg via ORAL
  Filled 2023-03-23 (×3): qty 1

## 2023-03-23 MED ORDER — LORAZEPAM 1 MG PO TABS: 0.0000 mg | ORAL_TABLET | Freq: Two times a day (BID) | ORAL | Status: DC

## 2023-03-23 NOTE — ED Provider Notes (Signed)
[place] [facility] AT [facility] Provider Note   CSN: [id number] Arrival date & time: 03/23/23  1527     History  Chief Complaint  Patient presents with   Psychiatric Evaluation    Jacob [name] is a 32 y.o. male with a past medical history significant for polycythemia, insulin-dependent type 1 diabetes, schizoaffective disorder who presents to the ED under IVC.  Per IVC paperwork patient has been diagnosed with schizophrenia and has not been taking his medications for 2 to 3 weeks.  Patient has been exhibiting aggressive behavior.  Grandmother petition for IVC.  Patient admits to frequent marijuana use and drinks alcohol occasionally.  Patient denies SI, HI, and auditory/visual hallucinations.  Denies any physical complaints.  Patient states he is here because his grandmother is "scared of him".  History obtained from patient and past medical records. No interpreter used during encounter.       Home Medications Prior to Admission medications   Medication Sig Start Date End Date Taking? Authorizing Provider  Continuous Blood Gluc Sensor (DEXCOM G6 SENSOR) MISC Replace every 10 days. Use to monitor blood sugar continuously 11/17/22   Steffanie Rainwater, MD  Continuous Glucose Receiver (DEXCOM G6 RECEIVER) DEVI 1 each by Continuous infusion (non-IV) route See admin instructions. Use as directed for continuous blood glucose monitoring. 02/27/23   Steffanie Rainwater, MD  Continuous Glucose Transmitter (DEXCOM G6 TRANSMITTER) MISC Use as directed for continuous glucose monitoring. Use Transmitter for 90 days. Discard and replace. 02/25/23   Steffanie Rainwater, MD  glucagon 1 MG injection Inject 1 mg into the skin See admin instructions. Follow package directions for low blood sugar. 06/10/22 06/10/23  Steffanie Rainwater, MD  ibuprofen (ADVIL) 600 MG tablet Take 1 tablet (600 mg total) by mouth every 6 (six) hours as needed. 03/21/23   Merrilee Jansky, MD  injection  device for insulin (INPEN 100-GREY-NOVOLOG-FIASP) DEVI Use with Novolog cartridges to administer Novolog insulin 10/06/22   Champ Mungo, DO  insulin aspart (NOVOLOG FLEXPEN) 100 UNIT/ML FlexPen Inject 7 units before breakfast, lunch and dinner 02/25/23   Steffanie Rainwater, MD  insulin aspart (NOVOLOG) cartridge Use with Inpen to administer Novolog insulin before eating meals and snacks as directed 07/21/22   Steffanie Rainwater, MD  insulin detemir (LEVEMIR FLEXPEN) 100 UNIT/ML FlexPen INJECT 30 UNITS INTO THE SKIN DAILY 03/02/23   Steffanie Rainwater, MD  Insulin Pen Needle 32G X 4 MM MISC Use to inject insulin 4 times a day 12/28/21   Steffanie Rainwater, MD  levofloxacin (LEVAQUIN) 500 MG tablet Take 1 tablet (500 mg total) by mouth daily. 03/21/23   Lamptey, Britta Mccreedy, MD  nicotine (NICODERM CQ - DOSED IN MG/24 HOURS) 21 mg/24hr patch Place 1 patch (21 mg total) onto the skin daily. 08/31/22   Shanna Cisco, NP  traZODone (DESYREL) 100 MG tablet Take 1 tablet (100 mg total) by mouth at bedtime as needed for sleep. Patient not taking: Reported on 03/21/2023 08/31/22   Shanna Cisco, NP      Allergies    Patient has no known allergies.    Review of Systems   Review of Systems  Respiratory:  Negative for shortness of breath.   Cardiovascular:  Negative for chest pain.  Gastrointestinal:  Negative for abdominal pain.  Psychiatric/Behavioral:  Positive for behavioral problems and suicidal ideas.     Physical Exam Updated Vital Signs BP (!) 143/98 (BP Location: Left Arm)   Pulse 89  Temp 98.1 F (36.7 C) (Oral)   Resp 18   Ht 5\' 9"  (1.753 m)   Wt 64.6 kg   SpO2 100%   BMI 21.03 kg/m  Physical Exam Vitals and nursing note reviewed.  Constitutional:      General: He is not in acute distress.    Appearance: He is not ill-appearing.  HENT:     Head: Normocephalic.  Eyes:     Pupils: Pupils are equal, round, and reactive to light.  Cardiovascular:     Rate and Rhythm:  Normal rate and regular rhythm.     Pulses: Normal pulses.     Heart sounds: Normal heart sounds. No murmur heard.    No friction rub. No gallop.  Pulmonary:     Effort: Pulmonary effort is normal.     Breath sounds: Normal breath sounds.  Abdominal:     General: Abdomen is flat. There is no distension.     Palpations: Abdomen is soft.     Tenderness: There is no abdominal tenderness. There is no guarding or rebound.  Musculoskeletal:        General: Normal range of motion.     Cervical back: Neck supple.  Skin:    General: Skin is warm and dry.  Neurological:     General: No focal deficit present.     Mental Status: He is alert.  Psychiatric:        Mood and Affect: Mood normal.        Behavior: Behavior normal.     ED Results / Procedures / Treatments   Labs (all labs ordered are listed, but only abnormal results are displayed) Labs Reviewed  COMPREHENSIVE METABOLIC PANEL - Abnormal; Notable for the following components:      Result Value   Potassium 3.3 (*)    Glucose, Bld 149 (*)    All other components within normal limits  RAPID URINE DRUG SCREEN, HOSP PERFORMED - Abnormal; Notable for the following components:   Tetrahydrocannabinol DETECTED (*)    All other components within normal limits  SALICYLATE LEVEL - Abnormal; Notable for the following components:   Salicylate Lvl <7.0 (*)    All other components within normal limits  ACETAMINOPHEN LEVEL - Abnormal; Notable for the following components:   Acetaminophen (Tylenol), Serum <10 (*)    All other components within normal limits  ETHANOL  CBC WITH DIFFERENTIAL/PLATELET  MAGNESIUM    EKG None  Radiology No results found.  Procedures Procedures    Medications Ordered in ED Medications  paliperidone (INVEGA) 24 hr tablet 6 mg (6 mg Oral Given 03/23/23 1654)  potassium chloride SA (KLOR-CON M) CR tablet 40 mEq (has no administration in time range)  LORazepam (ATIVAN) injection 0-4 mg (has no  administration in time range)    Or  LORazepam (ATIVAN) tablet 0-4 mg (has no administration in time range)  LORazepam (ATIVAN) injection 0-4 mg (has no administration in time range)    Or  LORazepam (ATIVAN) tablet 0-4 mg (has no administration in time range)  thiamine (VITAMIN B1) tablet 100 mg (has no administration in time range)    Or  thiamine (VITAMIN B1) injection 100 mg (has no administration in time range)    ED Course/ Medical Decision Making/ A&P Clinical Course as of 03/23/23 1743  Wed Mar 23, 2023  1739 Tetrahydrocannabinol(!): DETECTED [CA]  1739 Potassium(!): 3.3 [CA]    Clinical Course User Index [CA] Mannie Stabile, [name]  Medical Decision Making Amount and/or Complexity of Data Reviewed Independent Historian:     Details: IVC, [employer] External Data Reviewed: notes.    Details: UC note Labs: ordered. Decision-making details documented in ED Course.  Risk Prescription drug management.   This patient presents to the ED for concern of hostile behavior, this involves an extensive number of treatment options, and is a complaint that carries with it a high risk of complications and morbidity.  The differential diagnosis includes schizoaffective disorder, metabolic derangement, infection, etc  32 year old male with history of schizoaffective disorder presents to the ED under IVC per grandmother.  Per grandmother patient has been experiencing hostile behavior.  He has not taken his medications in 2-3 weeks per IVC paperwork. Denies SI, HI, and auditory/visual hallucinations.  Admits to marijuana use however, no other drugs.  Admits to drinking alcohol socially.  Upon arrival patient afebrile, not tachycardic or hypoxic.  Patient in no acute distress.  Patient cooperative during initial evaluation.  Medical clearance labs ordered. First examination completed.  CBC unremarkable.  No leukocytosis.  Normal hemoglobin.  CMP significant for  hypokalemia at 3.3.  Potassium repleted.  Hyperglycemia 149.  No anion gap.  Normal renal function.  Ethanol, salicylate, and acetaminophen level within normal limits.  UDS positive for THC.  Patient has been medically cleared for TTS evaluation.  Patient states he drinks only socially however, per triage note patient drinks beer daily.  CIWA protocol in place.  The patient has been placed in psychiatric observation due to the need to provide a safe environment for the patient while obtaining psychiatric consultation and evaluation, as well as ongoing medical and medication management to treat the patient's condition.  The patient has been placed under full IVC at this time.  Has PCP       Final Clinical Impression(s) / ED Diagnoses Final diagnoses:  Schizoaffective disorder, unspecified type [facility])    Rx / DC Orders ED Discharge Orders     None         Jesusita Oka 03/23/23 1743    Charlynne Pander, MD 03/23/23 2330

## 2023-03-23 NOTE — Consult Note (Signed)
[facility] Face-to-Face Psychiatry Consult   Reason for Consult: Involuntary commitment Referring Physician:  Claudette Stapler PA Patient Identification: Jacob [name] MRN:  [id number] Principal Diagnosis: Involuntary commitment Diagnosis:  Principal Problem:   Involuntary commitment Active Problems:   Schizoaffective disorder (HCC)   Total Time spent with patient: 15 minutes  Subjective:   Jacob [name] is a 32 y.o. male presented to [facility] emergency department under involuntary commitment.  Per affidavit and petition "petitioner states respondent has been diagnosed with schizophrenia however has not taken medications for the past 3 weeks.  [name] has not been sleeping regularly not tending to personal hygiene.  [name] talks to his self all the time and has become hostile and threatening towards me.  Sunday I called the police due to hostile behavior towards me.  Yelling and screaming attempted to take my car keys.  The respondent threatened to kill me.  In addition the respondent drinks beer every day and smokes marijuana frequently."  Patient was seen and evaluated face-to-face by this provider.  He does admit to making threats towards his grandmother.  States he got upset because she would not take him to the local [employer] gas station in order to buy a black and mild. "  That helps clear my mind."  He reports my grandmother knows that I have diabetes and I cannot walk that far.  Reports his last inpatient admission was late last year.  Patient is unable to recall which medication he is currently prescribed.  He denied that he is currently employed at this time.  Reports auditory hallucinations.  [name] stated that he is unable to reside with his parents because his younger brother struggle with mental illness and her currently resides with their parents, and reported that they don't get alone.  Disposition pending collateral-attempted to contact [facility] [phone]. No number listed on  affidavit and petition. [facility] department note that patient has been calm and cooperative.    HPI:  Per initial admission assessment note:"Jacob [name] is a 32 y.o. male with a past medical history significant for polycythemia, insulin-dependent type 1 diabetes, schizoaffective disorder who presents to the ED under IVC.  Per IVC paperwork patient has been diagnosed with schizophrenia and has not been taking his medications for 2 to 3 weeks.  Patient has been exhibiting aggressive behavior.  Grandmother petition for IVC.  Patient admits to frequent marijuana use and drinks alcohol occasionally.  Patient denies SI, HI, and auditory/visual hallucinations.  Denies any physical complaints.  Patient states he is here because his grandmother is "scared of him". "  Past Psychiatric History: See HPI  Risk to Self:   Risk to Others:   Prior Inpatient Therapy:   Prior Outpatient Therapy:    Past Medical History:  Past Medical History:  Diagnosis Date   Bipolar 1 disorder (HCC)    History of attempted suicide 2019   Unsuccessful suicide attempt in 2019 with rat poison   Schizoaffective disorder (HCC)    Type 1 diabetes mellitus on insulin therapy (HCC) 2019   History reviewed. No pertinent surgical history. Family History:  Family History  Problem Relation Age of Onset   Healthy Mother    Healthy Father    Family Psychiatric  History:  Social History:  Social History   Substance and Sexual Activity  Alcohol Use Yes   Comment: social/occassional     Social History   Substance and Sexual Activity  Drug Use Yes   Types: Marijuana    Social History  Socioeconomic History   Marital status: Single    Spouse name: Not on file   Number of children: Not on file   Years of education: Not on file   Highest education level: Not on file  Occupational History   Not on file  Tobacco Use   Smoking status: Every Day    Types: Cigars   Smokeless tobacco: Never   Tobacco comments:    2  BLACK AND MILD A DAY  Vaping Use   Vaping Use: Every day  Substance and Sexual Activity   Alcohol use: Yes    Comment: social/occassional   Drug use: Yes    Types: Marijuana   Sexual activity: Not on file  Other Topics Concern   Not on file  Social History Narrative   Not on file   Social Determinants of Health   Financial Resource Strain: Not on file  Food Insecurity: Not on file  Transportation Needs: Not on file  Physical Activity: Not on file  Stress: Not on file  Social Connections: Not on file   Additional Social History:    Allergies:  No Known Allergies  Labs:  Results for orders placed or performed during the hospital encounter of 03/23/23 (from the past 48 hour(s))  Comprehensive metabolic panel     Status: Abnormal   Collection Time: 03/23/23  4:35 PM  Result Value Ref Range   Sodium 135 135 - 145 mmol/L   Potassium 3.3 (L) 3.5 - 5.1 mmol/L   Chloride 100 98 - 111 mmol/L   CO2 26 22 - 32 mmol/L   Glucose, Bld 149 (H) 70 - 99 mg/dL    Comment: Glucose reference range applies only to samples taken after fasting for at least 8 hours.   BUN 16 6 - 20 mg/dL   Creatinine, Ser 8.11 0.61 - 1.24 mg/dL   Calcium 9.2 8.9 - 91.4 mg/dL   Total Protein 7.5 6.5 - 8.1 g/dL   Albumin 4.1 3.5 - 5.0 g/dL   AST 15 15 - 41 U/L   ALT 14 0 - 44 U/L   Alkaline Phosphatase 56 38 - 126 U/L   Total Bilirubin 0.6 0.3 - 1.2 mg/dL   GFR, Estimated >78 >29 mL/min    Comment: (NOTE) Calculated using the CKD-EPI Creatinine Equation (2021)    Anion gap 9 5 - 15    Comment: Performed at [facility], [address], [place], [place] [address]  Ethanol     Status: None   Collection Time: 03/23/23  4:35 PM  Result Value Ref Range   Alcohol, Ethyl (B) <10 <10 mg/dL    Comment: (NOTE) Lowest detectable limit for serum alcohol is 10 mg/dL.  For medical purposes only. Performed at [name], [address], [place], [place] [address]   CBC  with Diff     Status: None   Collection Time: 03/23/23  4:35 PM  Result Value Ref Range   WBC 7.4 4.0 - 10.5 K/uL   RBC 4.73 4.22 - 5.81 MIL/uL   Hemoglobin 14.2 13.0 - 17.0 g/dL   HCT 84.6 96.2 - 95.2 %   MCV 88.8 80.0 - 100.0 fL   MCH 30.0 26.0 - 34.0 pg   MCHC 33.8 30.0 - 36.0 g/dL   RDW 84.1 32.4 - 40.1 %   Platelets 299 150 - 400 K/uL   nRBC 0.0 0.0 - 0.2 %   Neutrophils Relative % 70 %   Neutro Abs 5.1 1.7 -  7.7 K/uL   Lymphocytes Relative 23 %   Lymphs Abs 1.7 0.7 - 4.0 K/uL   Monocytes Relative 7 %   Monocytes Absolute 0.6 0.1 - 1.0 K/uL   Eosinophils Relative 0 %   Eosinophils Absolute 0.0 0.0 - 0.5 K/uL   Basophils Relative 0 %   Basophils Absolute 0.0 0.0 - 0.1 K/uL   Immature Granulocytes 0 %   Abs Immature Granulocytes 0.01 0.00 - 0.07 K/uL    Comment: Performed at [facility], [address], [place], [place] [address]  Salicylate level     Status: Abnormal   Collection Time: 03/23/23  4:35 PM  Result Value Ref Range   Salicylate Lvl <7.0 (L) 7.0 - 30.0 mg/dL    Comment: Performed at [facility], [address], [place], [place] [address]  Acetaminophen level     Status: Abnormal   Collection Time: 03/23/23  4:35 PM  Result Value Ref Range   Acetaminophen (Tylenol), Serum <10 (L) 10 - 30 ug/mL    Comment: (NOTE) Therapeutic concentrations vary significantly. A range of 10-30 ug/mL  may be an effective concentration for many patients. However, some  are best treated at concentrations outside of this range. Acetaminophen concentrations >150 ug/mL at 4 hours after ingestion  and >50 ug/mL at 12 hours after ingestion are often associated with  toxic reactions.  Performed at [facility], [address], [place], [place] [address]   Magnesium     Status: None   Collection Time: 03/23/23  4:35 PM  Result Value Ref Range   Magnesium 1.8 1.7 - 2.4 mg/dL    Comment: Performed at [facility], [address] [place], [place] [address]  Urine rapid drug screen (hosp performed)     Status: Abnormal   Collection Time: 03/23/23  4:54 PM  Result Value Ref Range   Opiates NONE DETECTED NONE DETECTED   Cocaine NONE DETECTED NONE DETECTED   Benzodiazepines NONE DETECTED NONE DETECTED   Amphetamines NONE DETECTED NONE DETECTED   Tetrahydrocannabinol DETECTED (A) NONE DETECTED   Barbiturates NONE DETECTED NONE DETECTED    Comment: Performed at [facility], [address], [place], [place] [address]    Current Facility-Administered Medications  Medication Dose Route Frequency Provider Last Rate Last Admin   LORazepam (ATIVAN) injection 0-4 mg  0-4 mg Intravenous Q6H Aberman, Caroline C, [name]       Or   LORazepam (ATIVAN) tablet 0-4 mg  0-4 mg Oral Q6H Mannie Stabile, [name]       [START ON 03/26/2023] LORazepam (ATIVAN) injection 0-4 mg  0-4 mg Intravenous Q12H Mannie Stabile, [name]       Or   [START ON 03/26/2023] LORazepam (ATIVAN) tablet 0-4 mg  0-4 mg Oral Q12H Aberman, Caroline C, [name]       paliperidone (INVEGA SUSTENNA) injection 234 mg  234 mg Intramuscular Once Rankin, Shuvon B, NP       paliperidone (INVEGA) 24 hr tablet 6 mg  6 mg Oral Daily [name], NP   6 mg at 03/23/23 1654   thiamine (VITAMIN B1) tablet 100 mg  100 mg Oral Daily Mannie Stabile, [name]   100 mg at 03/23/23 1811   Or   thiamine (VITAMIN B1) injection 100 mg  100 mg Intravenous Daily Mannie Stabile, [name]       Current Outpatient Medications  Medication Sig Dispense Refill   Continuous Blood  Gluc Sensor (DEXCOM G6 SENSOR) MISC Replace every 10 days. Use to monitor blood sugar continuously 9 each 3   Continuous Glucose Receiver (DEXCOM G6 RECEIVER) DEVI 1 each by Continuous infusion (non-IV) route See admin instructions. Use as directed for continuous blood glucose monitoring. 1 each 3   Continuous Glucose Transmitter (DEXCOM G6 TRANSMITTER) MISC Use as  directed for continuous glucose monitoring. Use Transmitter for 90 days. Discard and replace. 1 each 3   glucagon 1 MG injection Inject 1 mg into the skin See admin instructions. Follow package directions for low blood sugar. 10 each 1   ibuprofen (ADVIL) 600 MG tablet Take 1 tablet (600 mg total) by mouth every 6 (six) hours as needed. 30 tablet 0   injection device for insulin (INPEN 100-GREY-NOVOLOG-FIASP) DEVI Use with Novolog cartridges to administer Novolog insulin 1 each 1   insulin aspart (NOVOLOG FLEXPEN) 100 UNIT/ML FlexPen Inject 7 units before breakfast, lunch and dinner 15 mL 2   insulin aspart (NOVOLOG) cartridge Use with Inpen to administer Novolog insulin before eating meals and snacks as directed 15 mL 11   insulin detemir (LEVEMIR FLEXPEN) 100 UNIT/ML FlexPen INJECT 30 UNITS INTO THE SKIN DAILY 15 mL 5   Insulin Pen Needle 32G X 4 MM MISC Use to inject insulin 4 times a day 360 each 3   levofloxacin (LEVAQUIN) 500 MG tablet Take 1 tablet (500 mg total) by mouth daily. 5 tablet 0   nicotine (NICODERM CQ - DOSED IN MG/24 HOURS) 21 mg/24hr patch Place 1 patch (21 mg total) onto the skin daily. 28 patch 3   traZODone (DESYREL) 100 MG tablet Take 1 tablet (100 mg total) by mouth at bedtime as needed for sleep. (Patient not taking: Reported on 03/21/2023) 30 tablet 3    Musculoskeletal: Strength & Muscle Tone: within normal limits Gait & Station: normal Patient leans: N/A            Psychiatric Specialty Exam:  Presentation  General Appearance:  Appropriate for Environment  Eye Contact: Good  Speech: Clear and Coherent; Normal Rate  Speech Volume: Normal  Handedness: Right   Mood and Affect  Mood: Euthymic  Affect: Congruent   Thought Process  Thought Processes: Coherent; Goal Directed  Descriptions of Associations:Intact  Orientation:Full (Time, Place and Person)  Thought Content:Logical  History of Schizophrenia/Schizoaffective disorder:No  data recorded Duration of Psychotic Symptoms:No data recorded Hallucinations:Hallucinations: Auditory  Ideas of Reference:None  Suicidal Thoughts:Suicidal Thoughts: No  Homicidal Thoughts:Homicidal Thoughts: No   Sensorium  Memory: Recent Good; Immediate Good; Remote Good  Judgment: Fair  Insight: Fair   Art therapist  Concentration: Good  Attention Span: Good  Recall: Fair  Fund of Knowledge: Fair  Language: Good   Psychomotor Activity  Psychomotor Activity:Psychomotor Activity: Normal   Assets  Assets: Desire for Improvement; Social Support   Sleep  Sleep:Sleep: Fair   Physical Exam: Physical Exam Vitals and nursing note reviewed.  Constitutional:      Appearance: Normal appearance.  Neurological:     Mental Status: He is alert and oriented to person, place, and time.  Psychiatric:        Mood and Affect: Mood normal.        Behavior: Behavior normal.        Thought Content: Thought content normal.    Review of Systems  Psychiatric/Behavioral:  Positive for hallucinations and substance abuse. Negative for depression and suicidal ideas.   All other systems reviewed and are negative.  Blood pressure Marland Kitchen)  143/98, pulse 89, temperature 98.1 F (36.7 C), temperature source Oral, resp. rate 18, height 5\' 9"  (1.753 m), weight 64.6 kg, SpO2 100 %. Body mass index is 21.03 kg/m.  Treatment Plan Summary: Daily contact with patient to assess and evaluate symptoms and progress in treatment and Medication management  Disposition:  Pending additional collateral  Restart Invega 6 mg po QHS    [name], NP 03/23/2023 7:36 PM

## 2023-03-23 NOTE — ED Triage Notes (Signed)
Patient to ED with [facility] police officers. Patient grandmother petition for IVC. Per IVC paperwork patient has been diagnosed with schizophrenia and has not been taking medication for 3 weeks. Patient has not been sleeping regularly or tending to personal hygiene, patient talking to himself all the time and has become hostile and threatening towards grandmother. Police had to be called to home on Sunday night due to hostile behavior, yelling and screaming, attempting to take car keys and threatening to kill her. Per IVC patient drinks beer daily and smoke marijuana frequently.   Patient states he has not been taking medications for 2-3 weeks states because he thinks they do not help. Patient states he did get upset because he wanted to go to the store to get a black and states his grandmother called the police on him. Patient reports smokes blacks and weed daily and drinks alcohol socially. Patient cooperative throughout triage. Patient denies any SI/HI. Patient states he thinks his grandmother did this because she is tired of putting up with him

## 2023-03-24 DIAGNOSIS — Z046 Encounter for general psychiatric examination, requested by authority: Secondary | ICD-10-CM

## 2023-03-24 LAB — CBG MONITORING, ED: Glucose-Capillary: 466 mg/dL — ABNORMAL HIGH (ref 70–99)

## 2023-03-24 MED ORDER — INSULIN ASPART 100 UNIT/ML IJ SOLN
7.0000 [IU] | Freq: Three times a day (TID) | INTRAMUSCULAR | Status: DC
  Administered 2023-03-25: 7 [IU] via SUBCUTANEOUS
  Filled 2023-03-24: qty 0.07

## 2023-03-24 MED ORDER — INSULIN DETEMIR 100 UNIT/ML ~~LOC~~ SOLN
30.0000 [IU] | Freq: Every day | SUBCUTANEOUS | Status: DC
  Administered 2023-03-24: 30 [IU] via SUBCUTANEOUS
  Filled 2023-03-24: qty 0.3

## 2023-03-24 NOTE — ED Notes (Signed)
Sleeping with no signs of distress or discomfort.
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
## 2023-03-24 NOTE — Progress Notes (Signed)
LCSW Progress Note  [id number]   Jacob [name]  03/24/2023  11:37 PM    Inpatient Behavioral Health Placement  Pt meets inpatient criteria per Alona Bene, PMHNP. There are no available beds within [facility] per [facility] [employer]. Referral was sent to the following facilities;   Destination  Service Provider Address Phone Fax  [facility]  [address] [place] [address] [phone]  [facility]  [address] [place] [address] [phone]  [facility] [address]  [address], [place] [address] [phone]  [facility]  [address] [place], [place] [address] [phone]  [facility] [address] [place]  [address] [place] [address] [phone]  [facility]  [address] [place], [place] [address] [phone]  [facility]  [address] [place] [address] [phone]  [facility]  [address] [place], [place] [address] [phone]  [facility]  [address] [place] [address] [phone]  [facility]  [address] [place] [address] [phone]  [facility]  [address] [place] [address] [phone]  [facility]  [address] [place] [address] [phone]  [facility]  [address], [place] [address] [phone]  [facility]  [address], [place] [address] [phone]  [facility]  [address], [place] [address] [phone]  [facility]  [address], [place]  [place] [address] [phone]  [facility]  [address], [place] [address] [phone]  [facility]  [address] [place] [address] [phone]  [facility]  [address], [place] [address] [phone]  [facility]  [address], [place] [address] [phone]  [facility]  [address], [place] [address] [phone]    Situation ongoing,  CSW will follow up.    Maryjean Ka, MSW, [facility] 03/24/2023 11:37 PM

## 2023-03-25 LAB — RESP PANEL BY RT-PCR (RSV, FLU A&B, COVID)  RVPGX2
Influenza A by PCR: NEGATIVE
Influenza B by PCR: NEGATIVE
Resp Syncytial Virus by PCR: NEGATIVE
SARS Coronavirus 2 by RT PCR: NEGATIVE

## 2023-03-25 LAB — CBG MONITORING, ED
Glucose-Capillary: 263 mg/dL — ABNORMAL HIGH (ref 70–99)
Glucose-Capillary: 216 mg/dL — ABNORMAL HIGH (ref 70–99)

## 2023-03-25 NOTE — ED Provider Notes (Signed)
Patient reassessed. HDS. NAD. Aware he is leaving for [facility] today. Accepting MD Luana Shu, MD 03/25/23 [phone]

## 2023-03-25 NOTE — ED Provider Notes (Signed)
Emergency Medicine Observation Re-evaluation Note  Jacob [name] is a 32 y.o. male, seen on rounds today.  Pt initially presented to the ED for complaints of Psychiatric Evaluation Patient with history of DM1 and schizoaffective disorder who presented under IVC for not taking any medications over the past 2 to 3 weeks. Currently, the patient is resting comfortably.  Physical Exam  BP (!) 128/97 (BP Location: Right Arm)   Pulse 77   Temp 97.8 F (36.6 C) (Oral)   Resp 18   Ht 5\' 9"  (1.753 m)   Wt 64.6 kg   SpO2 99%   BMI 21.03 kg/m  Physical Exam General: Resting comfortably Cardiac: Regular rate Lungs: Breathing comfortably Psych: Calm  ED Course / MDM  EKG:   I have reviewed the labs performed to date as well as medications administered while in observation.  Recent changes in the last 24 hours include patient to go to a psychiatric facility today.  Plan  Current plan is for inpatient psychiatric placement    Mardene Sayer, MD 03/25/23 [phone]

## 2023-03-25 NOTE — ED Notes (Signed)
BG has been rechecked, Covid test has resulted, report has been called to [facility] nurse Rolan Bucco took report.  Patient can arrive after 11:00 am. Must take IVC paperwork with him. Since fax machine was not working.

## 2023-03-25 NOTE — Progress Notes (Addendum)
Pt was accepted to [facility] TODAY 03/25/2023 PENDING Negative COVID, IVC paperwork Faxed to [phone], and repeat repeat glucose.  Pt meets inpatient criteria per Alona Bene, PMHNP  Attending Physician will be Daine Floras, MD  Report can be called to: - [phone]  Pt can arrive after: PENDING conformation; 11:00am  Care Team notified: Alona Bene, PMHNP, [place]ram Millard, RN and Disposition CSW [address] LCSWA   [place], [place] 03/25/2023 @ 1:05 AM

## 2023-03-28 ENCOUNTER — Encounter: Payer: Self-pay | Admitting: *Deleted

## 2023-04-25 ENCOUNTER — Ambulatory Visit: Payer: Medicaid Other | Admitting: Student

## 2023-04-25 VITALS — BP 107/87 | HR 103 | Temp 98.1°F | Ht 69.0 in | Wt 141.2 lb

## 2023-04-25 DIAGNOSIS — F250 Schizoaffective disorder, bipolar type: Secondary | ICD-10-CM | POA: Diagnosis not present

## 2023-04-25 DIAGNOSIS — E10649 Type 1 diabetes mellitus with hypoglycemia without coma: Secondary | ICD-10-CM | POA: Diagnosis not present

## 2023-04-25 DIAGNOSIS — F333 Major depressive disorder, recurrent, severe with psychotic symptoms: Secondary | ICD-10-CM

## 2023-04-25 DIAGNOSIS — E109 Type 1 diabetes mellitus without complications: Secondary | ICD-10-CM
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
## 2023-04-25 NOTE — Patient Instructions (Signed)
Thank you, Jacob [name] for allowing [place] to provide your care today. Today we discussed your recent inpatient psych treatment and diabetes. Make sure to take all your psych meds as prescribed. Continue your current insulin dose and I will reach out to [name] to help with your CGM.    My Chart Access: [email or web address]  Please follow-up in 1 month  Please make sure to arrive 15 minutes prior to your next appointment. If you arrive late, you may be asked to reschedule.    We look forward to seeing you next time. Please call our clinic at [phone] if you have any questions or concerns. The best time to call is Monday-Friday from 9am-4pm, but there is someone available 24/7. If after hours or the weekend, call the main hospital number and ask for the Internal Medicine Resident On-Call. If you need medication refills, please notify your pharmacy one week in advance and they will send [place] a request.   Thank you for letting us take part in your care. Wishing you the best!  Steffanie Rainwater, MD 04/25/2023, 4:16 PM IM Resident, PGY-3 [name] 41:10

## 2023-04-26 ENCOUNTER — Encounter: Payer: Self-pay | Admitting: Student

## 2023-04-26 ENCOUNTER — Telehealth: Payer: Self-pay

## 2023-04-26 MED ORDER — DEXCOM G6 RECEIVER DEVI
1.0000 | 3 refills | Status: DC
Start: 2023-04-26 — End: 2023-08-22

## 2023-04-26 MED ORDER — DEXCOM G6 SENSOR MISC: 3 refills | Status: DC

## 2023-04-26 NOTE — Telephone Encounter (Signed)
[name] stated she was asked by the doctor to call back with pt's additional medications he takes - Invega 6 mg daily and Divalproex 250 mg nightly.
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
## 2023-04-26 NOTE — Telephone Encounter (Signed)
Thank you. I have updated it in his chart.

## 2023-04-26 NOTE — Assessment & Plan Note (Signed)
Patient presented today with his grandmother after a recent inpatient psych treatment. Per grandmother, patient refused to go for his monthly invega injection last month and after he became aggressive towards her, she involuntary committed him at [facility]. He was IVC'ed from 5/22-5/24 then transferred to [facility], [place] for inpatient psych treatment. He received treatment for 2 weeks and discharged on June 7th. His psych meds were modified and he is currently taking Invega 6 mg daily and Depakote 250 mg nightly. He currently lives with his brother but his mood has been better since his inpatient psych treatment.   Plan: -Continue Invega 6 mg daily -Continue Depakote 250 mg nightly -Follow up with behavior health

## 2023-04-26 NOTE — Assessment & Plan Note (Signed)
>>  ASSESSMENT AND PLAN FOR UNCONTROLLED TYPE 1 DIABETES MELLITUS WITH HYPOGLYCEMIA WITHOUT COMA (HCC) WRITTEN ON 04/26/2023  2:07 PM BY AMPONSAH, CLARETTA HERO, MD  Patient reports adherence to his insulin  regimen with Levemir  30 units daily and NovoLog  7 units 3 times daily with meals. Per grandmother, patient still does not have the receiver for his Dexcom G6. He has the sensor and transmitter at home. Patient reports he has not checked his blood sugar in the last 2 months but denies any syncope episodes. He does report occasional polydipsia and diaphoresis but states this is when he is in the sun. I will order him a new Dexcom G6 receiver so he can start using his CGM and follow-up in 1 month for repeat A1c and insulin  adjustment.  Plan: -Refill Dexcom G6 receiver -Continue Levemir  30 units daily -Continue NovoLog  7 units 3 times daily with meals -Follow-up in 1 month for repeat A1c -Referred to ophthalmology for diabetic eye exam

## 2023-04-26 NOTE — Telephone Encounter (Signed)
Requesting to speak with a nurse to update on medications. Please call back.

## 2023-04-26 NOTE — Assessment & Plan Note (Signed)
Patient reports adherence to his insulin regimen with Levemir 30 units daily and NovoLog 7 units 3 times daily with meals. Per grandmother, patient still does not have the receiver for his Dexcom G6. He has the sensor and transmitter at home. Patient reports he has not checked his blood sugar in the last 2 months but denies any syncope episodes. He does report occasional polydipsia and diaphoresis but states this is when he is in the sun. I will order him a new Dexcom G6 receiver so he can start using his CGM and follow-up in 1 month for repeat A1c and insulin adjustment.  Plan: -Refill Dexcom G6 receiver -Continue Levemir 30 units daily -Continue NovoLog 7 units 3 times daily with meals -Follow-up in 1 month for repeat A1c -Referred to ophthalmology for diabetic eye exam

## 2023-04-28 NOTE — Progress Notes (Signed)
Internal Medicine Clinic Attending  Case discussed with Dr. Amponsah  At the time of the visit.  We reviewed the resident's history and exam and pertinent patient test results.  I agree with the assessment, diagnosis, and plan of care documented in the resident's note.  

## 2023-06-01 ENCOUNTER — Encounter: Payer: Medicaid Other | Admitting: Internal Medicine

## 2023-06-24 ENCOUNTER — Other Ambulatory Visit: Payer: Self-pay | Admitting: Student

## 2023-06-24 DIAGNOSIS — E109 Type 1 diabetes mellitus without complications: Secondary | ICD-10-CM

## 2023-06-27 NOTE — Telephone Encounter (Signed)
Next appt scheduled 8/28 with PCP. 

## 2023-06-29 ENCOUNTER — Ambulatory Visit: Payer: MEDICAID | Admitting: Internal Medicine

## 2023-06-29 ENCOUNTER — Other Ambulatory Visit: Payer: Self-pay

## 2023-06-29 ENCOUNTER — Encounter: Payer: Self-pay | Admitting: Internal Medicine

## 2023-06-29 VITALS — BP 119/77 | HR 99 | Temp 98.2°F | Resp 18 | Ht 69.0 in | Wt 130.3 lb

## 2023-06-29 DIAGNOSIS — Z794 Long term (current) use of insulin: Secondary | ICD-10-CM

## 2023-06-29 DIAGNOSIS — E109 Type 1 diabetes mellitus without complications: Secondary | ICD-10-CM | POA: Diagnosis not present

## 2023-06-29 DIAGNOSIS — F333 Major depressive disorder, recurrent, severe with psychotic symptoms: Secondary | ICD-10-CM

## 2023-06-29 DIAGNOSIS — E10649 Type 1 diabetes mellitus with hypoglycemia without coma: Secondary | ICD-10-CM | POA: Diagnosis not present

## 2023-06-29 LAB — POCT GLYCOSYLATED HEMOGLOBIN (HGB A1C): Hemoglobin A1C: 10.5 % — AB (ref 4.0–5.6)

## 2023-06-29 LAB — GLUCOSE, CAPILLARY: Glucose-Capillary: 209 mg/dL — ABNORMAL HIGH (ref 70–99)

## 2023-06-29 NOTE — Assessment & Plan Note (Signed)
>>  ASSESSMENT AND PLAN FOR UNCONTROLLED TYPE 1 DIABETES MELLITUS WITH HYPOGLYCEMIA WITHOUT COMA (HCC) WRITTEN ON 06/30/2023 10:24 AM BY FRANCELLA ROGUE, MD  Patient reports he has only irregularly been taking his insulin  and never checks his blood glucose at home. Currently prescribed regimen is Levemir  30 units daily and Novolog  7 units with meals. Patient has received Dexcom sensor and receiver but does not use it. Patient never scheduled eye exam after referral was sent at last visit.   -Check A1c today -Encouraged taking insulin  every single day and checking blood glucose with Dexcom -referral for eye exam sent -follow up in 1 month to recheck A1c and monitor adherence to insulin  regimen

## 2023-06-29 NOTE — Patient Instructions (Signed)
Jacob [name]:   It was a pleasure meeting you today. Today we discussed your medications for your diabetes and your mood disorders. I feel that you are on an appropriate regimen, it will just be important to take them as prescribed every single day. I will also reach out to behavioral health and send in a referral for an eye exam.   Thank you,  [name], MD

## 2023-06-29 NOTE — Assessment & Plan Note (Signed)
Patient is currently prescribed Invega 6mg  and Depakote 250mg , which he has not been taking. He states he does not feel he needs it. Patient was recently IVC'ed at inpatient psychiatric facility for aggression towards his grandmother. I spoke privately with grandmother who accompanied patient, she denied any feelings of danger or concern for her health and safety at home. Patient states he would be amenable to meeting with behavioral health again.   -Message behavior health for recommendations -Encouraged adherence to current medication regimen

## 2023-06-29 NOTE — Progress Notes (Unsigned)
Subjective:  CC: A1c follow up  HPI:  JacobJacob [name] is a 32 y.o. male with a past medical history stated below and presents today for above. Please see problem based assessment and plan for additional details.  Past Medical History:  Diagnosis Date   Bipolar 1 disorder (HCC)    History of attempted suicide 2019   Unsuccessful suicide attempt in 2019 with rat poison   Schizoaffective disorder (HCC)    Type 1 diabetes mellitus on insulin therapy (HCC) 2019    Current Outpatient Medications on File Prior to Visit  Medication Sig Dispense Refill   BD PEN NEEDLE NANO 2ND GEN 32G X 4 MM MISC USE TO INJECT INSULIN 4 TIMES A DAY 400 each 3   Continuous Glucose Receiver (DEXCOM G6 RECEIVER) DEVI 1 each by Continuous infusion (non-IV) route See admin instructions. Use as directed for continuous blood glucose monitoring. 1 each 3   Continuous Glucose Sensor (DEXCOM G6 SENSOR) MISC Replace every 10 days. Use to monitor blood sugar continuously 9 each 3   Continuous Glucose Transmitter (DEXCOM G6 TRANSMITTER) MISC Use as directed for continuous glucose monitoring. Use Transmitter for 90 days. Discard and replace. (Patient not taking: Reported on 03/24/2023) 1 each 3   divalproex (DEPAKOTE) 250 MG DR tablet Take 250 mg by mouth at bedtime.     glucagon 1 MG injection Inject 1 mg into the skin See admin instructions. Follow package directions for low blood sugar. 10 each 1   ibuprofen (ADVIL) 600 MG tablet Take 1 tablet (600 mg total) by mouth every 6 (six) hours as needed. (Patient taking differently: Take 600 mg by mouth every 6 (six) hours as needed (for pain).) 30 tablet 0   injection device for insulin (INPEN 100-GREY-NOVOLOG-FIASP) DEVI Use with Novolog cartridges to administer Novolog insulin 1 each 1   insulin aspart (NOVOLOG FLEXPEN) 100 UNIT/ML FlexPen Inject 7 units before breakfast, lunch and dinner (Patient taking differently: Inject 7 Units into the skin 3 (three) times daily before  meals.) 15 mL 2   insulin aspart (NOVOLOG) cartridge Use with Inpen to administer Novolog insulin before eating meals and snacks as directed 15 mL 11   insulin detemir (LEVEMIR FLEXPEN) 100 UNIT/ML FlexPen INJECT 30 UNITS INTO THE SKIN DAILY 15 mL 5   paliperidone (INVEGA) 6 MG 24 hr tablet Take 6 mg by mouth daily.     TYLENOL 500 MG tablet Take 500-1,000 mg by mouth every 6 (six) hours as needed for mild pain or headache.     No current facility-administered medications on file prior to visit.    Review of Systems: ROS negative except for as is noted on the assessment and plan.  Objective:   Vitals:   06/29/23 1458  BP: 119/77  Pulse: 99  Resp: 18  Temp: 98.2 F (36.8 C)  TempSrc: Oral  SpO2: 97%  Weight: 130 lb 4.8 oz (59.1 kg)  Height: 5\' 9"  (1.753 m)    Physical Exam: Constitutional: well-appearing, in no acute distress HENT: normocephalic atraumatic, mucous membranes moist Eyes: conjunctiva non-erythematous Neck: supple Cardiovascular: regular rate and rhythm, no m/r/g Pulmonary/Chest: normal work of breathing on room air, lungs clear to auscultation bilaterally Abdominal: soft, non-tender, non-distended MSK: normal bulk and tone Neurological: alert & oriented x 3, 5/5 strength in bilateral upper and lower extremities, normal gait Skin: warm and dry  Assessment & Plan:   Uncontrolled type 1 diabetes mellitus with hypoglycemia without coma [facility]) Patient reports he has only irregularly  been taking his insulin and never checks his blood glucose at home. Currently prescribed regimen is Levemir 30 units daily and Novolog 7 units with meals. Patient has received Dexcom sensor and receiver but does not use it. Patient never scheduled eye exam after referral was sent at last visit.   -Check A1c today -Encouraged taking insulin every single day and checking blood glucose with Dexcom -referral for eye exam sent -follow up in 1 month to recheck A1c and monitor adherence to  insulin regimen  MDD (major depressive disorder), recurrent, severe, with psychosis (HCC) Patient is currently prescribed Invega 6mg  and Depakote 250mg , which he has not been taking. He states he does not feel he needs it. Denies current SI/HI. Patient was recently IVC'ed at inpatient psychiatric facility for aggression towards his grandmother. I spoke privately with grandmother who accompanied patient, she denied any feelings of danger or concern for her health and safety at home. Patient states he would be amenable to meeting with behavioral health again.   -Message behavior health for recommendations -Encouraged adherence to current medication regimen   Patient seen with Dr. Rance Muir MD [facility]  PGY-1 Pager: [phone] Date 06/30/2023  Time 10:24 AM

## 2023-06-29 NOTE — Assessment & Plan Note (Signed)
Patient reports he has only irregularly been taking his insulin and never checks his blood glucose at home. Currently prescribed regimen is Levemir 30 units daily and Novolog 7 units with meals. Patient has received Dexcom sensor and receiver but does not use it. Patient never scheduled eye exam after referral was sent at last visit.   -Check A1c today -Encouraged taking insulin every single day and checking blood glucose with Dexcom -referral for eye exam sent -follow up in 1 month to recheck A1c and monitor adherence to insulin regimen

## 2023-06-30 ENCOUNTER — Other Ambulatory Visit: Payer: Self-pay | Admitting: Internal Medicine

## 2023-06-30 NOTE — Addendum Note (Signed)
Addended by: [name] on: 06/30/2023 10:24 AM   Modules accepted: Orders

## 2023-06-30 NOTE — Addendum Note (Signed)
Addended by: Dickie La on: 06/30/2023 12:24 PM   Modules accepted: Level of Service

## 2023-06-30 NOTE — Progress Notes (Signed)
Internal Medicine Clinic Attending  I was physically present during the key portions of the resident provided service and participated in the medical decision making of patient's management care. I reviewed pertinent patient test results.  The assessment, diagnosis, and plan were formulated together and I agree with the documentation in the resident's note. Dr. Carlynn Purl will be reaching out patient's behavioral health team to assist in planning for these needs. Patient expressed willingness to reengage in care.  Dickie La, MD

## 2023-08-01 ENCOUNTER — Encounter: Payer: Self-pay | Admitting: Student

## 2023-08-01 ENCOUNTER — Ambulatory Visit: Payer: MEDICAID | Admitting: Student

## 2023-08-01 VITALS — BP 137/72 | HR 99 | Temp 97.7°F | Wt 128.0 lb

## 2023-08-01 DIAGNOSIS — S90821A Blister (nonthermal), right foot, initial encounter: Secondary | ICD-10-CM | POA: Insufficient documentation

## 2023-08-01 DIAGNOSIS — E10649 Type 1 diabetes mellitus with hypoglycemia without coma: Secondary | ICD-10-CM

## 2023-08-01 DIAGNOSIS — Z794 Long term (current) use of insulin: Secondary | ICD-10-CM

## 2023-08-01 DIAGNOSIS — Z23 Encounter for immunization: Secondary | ICD-10-CM | POA: Diagnosis not present

## 2023-08-01 DIAGNOSIS — E109 Type 1 diabetes mellitus without complications: Secondary | ICD-10-CM

## 2023-08-01 DIAGNOSIS — F333 Major depressive disorder, recurrent, severe with psychotic symptoms: Secondary | ICD-10-CM | POA: Diagnosis not present

## 2023-08-01 NOTE — Progress Notes (Unsigned)
Subjective:  CC: Diabetes follow-up  HPI:  [name] is a 32 y.o. male with a past medical history stated below and presents today for diabetes follow-up, and a blister on right foot. Please see problem based assessment and plan for additional details.  Past Medical History:  Diagnosis Date   Bipolar 1 disorder (HCC)    History of attempted suicide 2019   Unsuccessful suicide attempt in 2019 with rat poison   Schizoaffective disorder (HCC)    Type 1 diabetes mellitus on insulin therapy (HCC) 2019    Current Outpatient Medications on File Prior to Visit  Medication Sig Dispense Refill   BD PEN NEEDLE NANO 2ND GEN 32G X 4 MM MISC USE TO INJECT INSULIN 4 TIMES A DAY 400 each 3   Continuous Glucose Receiver (DEXCOM G6 RECEIVER) DEVI 1 each by Continuous infusion (non-IV) route See admin instructions. Use as directed for continuous blood glucose monitoring. 1 each 3   Continuous Glucose Sensor (DEXCOM G6 SENSOR) MISC Replace every 10 days. Use to monitor blood sugar continuously 9 each 3   Continuous Glucose Transmitter (DEXCOM G6 TRANSMITTER) MISC Use as directed for continuous glucose monitoring. Use Transmitter for 90 days. Discard and replace. (Patient not taking: Reported on 03/24/2023) 1 each 3   divalproex (DEPAKOTE) 250 MG DR tablet Take 250 mg by mouth at bedtime.     glucagon 1 MG injection Inject 1 mg into the skin See admin instructions. Follow package directions for low blood sugar. 10 each 1   ibuprofen (ADVIL) 600 MG tablet Take 1 tablet (600 mg total) by mouth every 6 (six) hours as needed. (Patient taking differently: Take 600 mg by mouth every 6 (six) hours as needed (for pain).) 30 tablet 0   injection device for insulin (INPEN 100-GREY-NOVOLOG-FIASP) DEVI Use with Novolog cartridges to administer Novolog insulin 1 each 1   insulin aspart (NOVOLOG FLEXPEN) 100 UNIT/ML FlexPen Inject 7 units before breakfast, lunch and dinner (Patient taking differently: Inject 7  Units into the skin 3 (three) times daily before meals.) 15 mL 2   insulin aspart (NOVOLOG) cartridge Use with Inpen to administer Novolog insulin before eating meals and snacks as directed 15 mL 11   insulin detemir (LEVEMIR FLEXPEN) 100 UNIT/ML FlexPen INJECT 30 UNITS INTO THE SKIN DAILY 15 mL 5   paliperidone (INVEGA) 6 MG 24 hr tablet Take 6 mg by mouth daily.     TYLENOL 500 MG tablet Take 500-1,000 mg by mouth every 6 (six) hours as needed for mild pain or headache.     No current facility-administered medications on file prior to visit.    Family History  Problem Relation Age of Onset   Healthy Mother    Healthy Father     Social History   Socioeconomic History   Marital status: Single    Spouse name: Not on file   Number of children: Not on file   Years of education: Not on file   Highest education level: Not on file  Occupational History   Not on file  Tobacco Use   Smoking status: Every Day    Types: Cigars, Cigarettes   Smokeless tobacco: Never   Tobacco comments:    2 BLACK AND MILD A DAY  Vaping Use   Vaping status: Every Day  Substance and Sexual Activity   Alcohol use: Yes    Comment: social/occassional   Drug use: Yes    Types: Marijuana   Sexual activity: Not on file  Other Topics Concern   Not on file  Social History Narrative   Not on file   Social Determinants of Health   Financial Resource Strain: Not on file  Food Insecurity: Not on file  Transportation Needs: Not on file  Physical Activity: Not on file  Stress: Not on file  Social Connections: Unknown (03/02/2022)   Received from [facility], [facility]   Social Network    Social Network: Not on file  Intimate Partner Violence: Unknown (02/05/2022)   Received from [facility], [facility]   HITS    Physically Hurt: Not on file    Insult or Talk Down To: Not on file    Threaten Physical Harm: Not on file    Scream or Curse: Not on file    Review of Systems: ROS negative  except for what is noted on the assessment and plan.  Objective:   Vitals:   08/01/23 0843  BP: 137/72  Pulse: 99  Temp: 97.7 F (36.5 C)  TempSrc: Oral  SpO2: 100%  Weight: 128 lb (58.1 kg)    Physical Exam: Constitutional: Well-appearing, not in acute distress. Cardiovascular: regular rate and rhythm, no m/r/g Pulmonary/Chest: normal work of breathing on room air, lungs clear to auscultation bilaterally Neurological: alert & oriented x 3, 5/5 strength in bilateral upper and lower extremities, normal gait MSK: Hematoma on right foot. No drainage.  Assessment & Plan:  Uncontrolled type 1 diabetes mellitus with hypoglycemia without coma (HCC) Most recent HbA1c 10.5. Previously prescribed home sensor, however patient  took it off  because device was failing to sync with his phone. Currently does not have monitor blood glucose at home.Marland Kitchen He is on Levemir, and NovoLog, and he endorses intermittent use of his medicines. He denies polydipsia or polyuria or weight loss.  -Follow-up appointment with the diabetes specialist, Ms. [name] to help with diabetes education -He was prescribed meter strips  Follow up in 2 weeks.  MDD (major depressive disorder), recurrent, severe, with psychosis (HCC)  Stable, denies any worsening mood swings or suicidal ideation.  He is not taking his Invega and Depakote as prescribed.  Patient educated on the importance of medicine compliance. - Continue Invega 6 mg - Continue Depakote 25 mg  Blister (nonthermal), right foot, initial encounter He has a painful hematoma on the right foot, perhaps as a result of trauma.  Due to his history of diabetes, recommending keeping the blister closed for now. However if blister opens up, he can apply neosporin or bacitracin on it as needed. -follow up @ next office visit    Patient seen with Dr. Kizzie Ide, MD [facility]  PGY-1 Pager: [phone]  Date 08/01/2023  Time 12:33 PM

## 2023-08-01 NOTE — Assessment & Plan Note (Signed)
>>  ASSESSMENT AND PLAN FOR UNCONTROLLED TYPE 1 DIABETES MELLITUS WITH HYPOGLYCEMIA WITHOUT COMA (HCC) WRITTEN ON 08/01/2023 12:34 PM BY CELESTINA CZAR, MD  Most recent HbA1c 10.5. Previously prescribed home sensor, however patient  took it off  because device was failing to sync with his phone. Currently does not have monitor blood glucose at home.Jacob [name] He is on Levemir , and NovoLog , and he endorses intermittent use of his medicines. He denies polydipsia or polyuria or weight loss.   -Follow-up appointment with the diabetes specialist, Ms. [name] to help with diabetes education -Follow up in 2 weeks.

## 2023-08-01 NOTE — Patient Instructions (Addendum)
Thank you, [name] for allowing [place] to provide your care today.   Today we discussed   Checking your blood glucose at home, first in the morning before meals, and after meals.  You were given meter strips to help check your blood glucose.  For the blood blister, leave it as a days.  If the blister opens up, he can treated with bacitracin or Neosporin to prevent this from getting infected.  I have ordered the following labs for you:    Referrals ordered today:   Referral Orders         Referral to Nutrition and Diabetes Services       Follow up:  2 weeks      Should you have any questions or concerns please call the internal medicine clinic at [phone].     Laretta Bolster, MD  [facility]

## 2023-08-01 NOTE — Assessment & Plan Note (Addendum)
Most recent HbA1c 10.5. Previously prescribed home sensor, however patient  took it off  because device was failing to sync with his phone. Currently does not have monitor blood glucose at home.Marland Kitchen He is on Levemir, and NovoLog, and he endorses intermittent use of his medicines. He denies polydipsia or polyuria or weight loss.   -Follow-up appointment with the diabetes specialist, Ms. [name] to help with diabetes education -Follow up in 2 weeks.

## 2023-08-01 NOTE — Assessment & Plan Note (Signed)
He has a painful hematoma on the right foot, perhaps as a result of trauma.  Due to his history of diabetes, recommending keeping the blister closed for now. However if blister opens up, he can apply neosporin or bacitracin on it as needed. -follow up @ next office visit

## 2023-08-01 NOTE — Assessment & Plan Note (Signed)
Stable, denies any worsening mood swings or suicidal ideation.  He is not taking his Invega and Depakote as prescribed.  Patient educated on the importance of medicine compliance. - Continue Invega 6 mg - Continue Depakote 25 mg

## 2023-08-04 ENCOUNTER — Other Ambulatory Visit: Payer: Self-pay | Admitting: Student

## 2023-08-04 DIAGNOSIS — E109 Type 1 diabetes mellitus without complications: Secondary | ICD-10-CM

## 2023-08-04 NOTE — Telephone Encounter (Signed)
Next appt scheduled 10/21 with Dr Nooruddin.

## 2023-08-08 ENCOUNTER — Telehealth: Payer: Self-pay

## 2023-08-08 NOTE — Progress Notes (Signed)
Internal Medicine Clinic Attending  I was physically present during the key portions of the resident provided service and participated in the medical decision making of patient's management care. I reviewed pertinent patient test results.  The assessment, diagnosis, and plan were formulated together and I agree with the documentation in the resident's note.  Reymundo Poll, MD

## 2023-08-08 NOTE — Telephone Encounter (Signed)
Pa for pt ( DEX COM G6 TANSMITTER ) came through on cover my meds.. Was submitted with last office notes and labs awaiting approval or denial

## 2023-08-09 NOTE — Telephone Encounter (Signed)
DECISION:    Approved on October 7 by [employer] 2017   Approved. DEXCOM G6 TRANSMITTER Misc is approved from 08/08/2023 to 02/06/2024.    All strengths of the drug are approved. Authorization Expiration Date: 02/06/2024   Drug Dexcom G6 Transmitter  Form [employer] Electronic Prior Authorization Form       ( COPY SENT PHARMACY   AND PLACE TO  SCAN TO CHART ALSO )

## 2023-08-22 ENCOUNTER — Other Ambulatory Visit: Payer: Self-pay | Admitting: Dietician

## 2023-08-22 ENCOUNTER — Ambulatory Visit: Payer: MEDICAID | Admitting: Student

## 2023-08-22 ENCOUNTER — Encounter: Payer: Self-pay | Admitting: Student

## 2023-08-22 ENCOUNTER — Ambulatory Visit: Payer: MEDICAID | Admitting: Dietician

## 2023-08-22 VITALS — BP 120/66 | HR 91 | Temp 98.2°F | Ht 69.0 in | Wt 124.9 lb

## 2023-08-22 DIAGNOSIS — E10649 Type 1 diabetes mellitus with hypoglycemia without coma: Secondary | ICD-10-CM | POA: Diagnosis not present

## 2023-08-22 DIAGNOSIS — Z794 Long term (current) use of insulin: Secondary | ICD-10-CM | POA: Diagnosis not present

## 2023-08-22 DIAGNOSIS — S90821D Blister (nonthermal), right foot, subsequent encounter: Secondary | ICD-10-CM

## 2023-08-22 DIAGNOSIS — S90821A Blister (nonthermal), right foot, initial encounter: Secondary | ICD-10-CM

## 2023-08-22 MED ORDER — DEXCOM G7 SENSOR MISC: 11 refills | Status: DC

## 2023-08-22 MED ORDER — INSULIN DEGLUDEC 100 UNIT/ML ~~LOC~~ SOPN: 30.0000 [IU] | PEN_INJECTOR | Freq: Every day | SUBCUTANEOUS | 3 refills | Status: DC

## 2023-08-22 MED ORDER — DEXCOM G7 RECEIVER DEVI: 0 refills | Status: DC

## 2023-08-22 NOTE — Telephone Encounter (Signed)
CGM request per patient. Needs receiver as he states his phone is too old to work with CGM

## 2023-08-22 NOTE — Progress Notes (Signed)
Diabetes Self-Management Education  Visit Type: Follow-up  Appt. Start Time: 1400 Appt. 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 male with a diagnosis of Diabetes:  .   ASSESSMENT  [name] states he is going away from 09-01-23 to 05/01/24 to mental health ward. Agrees try to get CGM and ask if he can use it while in ward. Send prescription  to [facility] [address] Estimated body mass index is 18.44 kg/m as calculated from the following:   Height as of an earlier encounter on 08/22/23: 5\' 9"  (1.753 m).   Weight as of an earlier encounter on 08/22/23: 124 lb 14.4 oz (56.7 kg). Wt Readings from Last 10 Encounters:  08/22/23 124 lb 14.4 oz (56.7 kg)  08/01/23 128 lb (58.1 kg)  06/29/23 130 lb 4.8 oz (59.1 kg)  04/25/23 141 lb 3.2 oz (64 kg)  03/23/23 142 lb 6.7 oz (64.6 kg)  02/25/23 142 lb 6.4 oz (64.6 kg)  11/17/22 145 lb 9.6 oz (66 kg)  06/10/22 153 lb (69.4 kg)  04/19/22 159 lb (72.1 kg)  03/03/22 157 lb 4.8 oz (71.4 kg)   Lab Results  Component Value Date   HGBA1C 10.5 (A) 06/29/2023   HGBA1C 12.7 (A) 02/25/2023   HGBA1C >14.0 (A) 11/17/2022   HGBA1C 9.4 (A) 06/10/2022   HGBA1C 9.9 (A) 03/03/2022       Diabetes Self-Management Education - 08/22/23 1400       Visit Information   Visit Type Follow-up      Health Coping   How would you rate your overall health? Good      Psychosocial Assessment   Patient Belief/Attitude about Diabetes Motivated to manage diabetes    What is the hardest part about your diabetes right now, causing you the most concern, or is the most worrisome to you about your diabetes?   Getting support / problem solving   being so thin   Self-care barriers Lack of material resources;Lack of transportation;Other (comment)   mental health issues   Self-management support Doctor's office;CDE visits;Family    Patient Concerns Glycemic Control;Weight Control    Special Needs Simplified  materials    Preferred Learning Style Auditory;Visual;Hands on    Learning Readiness Not Ready   he is focsued today on "living Life" and going to mental ward where he will have meals, medication and blood sugar done for him   How often do you need to have someone help you when you read instructions, pamphlets, or other written materials from your doctor or pharmacy? 2 - Rarely    What is the last grade level you completed in school? 12      Pre-Education Assessment   Patient understands the diabetes disease and treatment process. Comprehends key points    Patient understands incorporating nutritional management into lifestyle. Comprehends key points    Patient undertands incorporating physical activity into lifestyle. Comprehends key points    Patient understands using medications safely. Comprehends key points    Patient understands monitoring blood glucose, interpreting and using results Needs Review    Patient understands prevention, detection, and treatment of acute complications. Comprehends key points    Patient understands prevention, detection, and treatment of chronic complications. Compreheands key points    Patient understands how to develop strategies to address psychosocial issues. Comprehends key points    Patient understands how to develop strategies to promote health/change behavior. Comprehends key points  Complications   Last HgB A1C per patient/outside source 10.5 %    How often do you check your blood sugar? 1-2 times/day    Fasting Blood glucose range (mg/dL) >161;096-045    Postprandial Blood glucose range (mg/dL) >409    Number of hypoglycemic episodes per month 0    Number of hyperglycemic episodes ( >200mg /dL): Daily    Can you tell when your blood sugar is high? No    Have you had a dilated eye exam in the past 12 months? No    Have you had a dental exam in the past 12 months? No    Are you checking your feet? No      Dietary Intake   Breakfast oatmeal     Lunch chicken and green beans    Dinner no rice potatoes ro bread, meat and vegetables    Beverage(s) sf drinks and water      Activity / Exercise   Activity / Exercise Type ADL's;Light (walking / raking leaves)    How many days per week do you exercise? 7    How many minutes per day do you exercise? 60    Total minutes per week of exercise 420      Patient Education   Previous Diabetes Education Yes (please comment)   here and [facility]   Monitoring Taught/evaluated CGM (comment)   discussed how CGM gives more information than fingerstick that could help him gain weight, feel better     Post-Education Assessment   Patient understands monitoring blood glucose, interpreting and using results Comprehends key points      Outcomes   Expected Outcomes Demonstrated interest in learning but significant barriers to change    Future DMSE Yearly   next July/August when he is back in [place] Status Completed      Subsequent Visit   Since your last visit have you continued or begun to take your medications as prescribed? No    Since your last visit have you had your blood pressure checked? Yes    Is your most recent blood pressure lower, unchanged, or higher since your last visit? Unchanged    Since your last visit have you experienced any weight changes? Loss    Weight Loss (lbs) 33    Since your last visit, are you checking your blood glucose at least once a day? No   has been using a meter            Individualized Plan for Diabetes Self-Management Training:   Learning Objective:  Patient will have a greater understanding of diabetes self-management. Patient education plan is to attend individual and/or group sessions per assessed needs and concerns.   Plan:   There are no Patient Instructions on file for this visit.  Expected Outcomes:  Demonstrated interest in learning but significant barriers to change  Education material provided: Diabetes Resources  If problems or  questions, patient to contact team via:  Phone  Future DSME appointment: Yearly (next July/August when he is back in [place]) [employer], RD 08/22/2023 3:04 PM.

## 2023-08-22 NOTE — Patient Instructions (Signed)
Thank you so much for coming to the clinic today!   I am sending in a different insulin called [name], please use whenever you run out of the Levemir. In the meanwhile, keep checking your sugars and avoid taking insulin if you see your sugar below 70.  If you have any questions please feel free to the call the clinic at anytime at [phone]. It was a pleasure seeing you!  Best, Dr. Thomasene Ripple

## 2023-08-23 NOTE — Assessment & Plan Note (Signed)
Blister has now resolved, and he denies any pain from the area.  He has good pulses on his feet as well, will resolve this issue.

## 2023-08-23 NOTE — Assessment & Plan Note (Signed)
>>  ASSESSMENT AND PLAN FOR UNCONTROLLED TYPE 1 DIABETES MELLITUS WITH HYPOGLYCEMIA WITHOUT COMA (HCC) WRITTEN ON 08/25/2023  4:29 PM BY NELIA DIRKS, MD  Patient presents for follow-up regarding his type 1 diabetes.  His most recent A1c was 10.5.  Currently uses fingerstick method to check his blood sugar.  He does endorse one episode of low, as morning of encounter after he ate his blood sugar was 40.  His current regimen is 30 units of Levemir  in the morning, and 7 units of NovoLog  3 times a day with meals.  I have sent in a prescription for a continuous glucose monitor for him, which he is interested in trying.   Would like to see patient back in 2 weeks with more data points for his glucose, as asymptomatic hypoglycemia as patient is reporting is worrisome.  He does understand what to do if his glucose drops that low again.  Given Levemir  will soon be unavailable, will switch him to Tresiba .  Plan: - 30 units of Tresiba  -NovoLog  7 units with meals

## 2023-08-23 NOTE — Assessment & Plan Note (Addendum)
Patient presents for follow-up regarding his type 1 diabetes.  His most recent A1c was 10.5.  Currently uses fingerstick method to check his blood sugar.  He does endorse some lows, as morning of encounter after he ate his blood sugar was 40.  His current regimen is 30 units of Levemir in the morning, and 7 units of NovoLog 3 times a day with meals.  I have sent in a prescription for a continuous glucose monitor for him, which he is interested in trying.   Would like to see patient back in 2 weeks with more data points for his glucose, as asymptomatic hypoglycemia as patient is reporting is worrisome.  He does understand what to do if his glucose drops that low again.  Given Levemir will soon be unavailable, will switch him to [place].  Plan: - 30 units of Tresiba -NovoLog 7 units with meals
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
## 2023-08-26 NOTE — Progress Notes (Signed)
Internal Medicine Clinic Attending  Case discussed with the resident at the time of the visit.  We reviewed the resident's history and exam and pertinent patient test results.  I agree with the assessment, diagnosis, and plan of care documented in the resident's note.  

## 2023-08-26 NOTE — Addendum Note (Signed)
Addended by: Dickie La on: 08/26/2023 08:37 AM   Modules accepted: Level of Service

## 2023-09-01 ENCOUNTER — Other Ambulatory Visit: Payer: Self-pay

## 2023-09-01 ENCOUNTER — Encounter (HOSPITAL_COMMUNITY): Payer: Self-pay

## 2023-09-01 ENCOUNTER — Emergency Department (HOSPITAL_COMMUNITY)
Admission: EM | Admit: 2023-09-01 | Discharge: 2023-09-03 | Disposition: A | Discharge status: Other Acute Inpatient Hospital | Payer: MEDICAID | Attending: Emergency Medicine | Admitting: Emergency Medicine

## 2023-09-01 DIAGNOSIS — R45850 Homicidal ideations: Secondary | ICD-10-CM | POA: Diagnosis not present

## 2023-09-01 DIAGNOSIS — Z87891 Personal history of nicotine dependence: Secondary | ICD-10-CM | POA: Insufficient documentation

## 2023-09-01 DIAGNOSIS — F259 Schizoaffective disorder, unspecified: Secondary | ICD-10-CM

## 2023-09-01 DIAGNOSIS — Z20822 Contact with and (suspected) exposure to covid-19: Secondary | ICD-10-CM | POA: Diagnosis not present

## 2023-09-01 DIAGNOSIS — E1165 Type 2 diabetes mellitus with hyperglycemia: Secondary | ICD-10-CM | POA: Insufficient documentation

## 2023-09-01 DIAGNOSIS — F250 Schizoaffective disorder, bipolar type: Secondary | ICD-10-CM | POA: Diagnosis present

## 2023-09-01 DIAGNOSIS — Z794 Long term (current) use of insulin: Secondary | ICD-10-CM | POA: Insufficient documentation

## 2023-09-01 LAB — CBC
HCT: 40.9 % (ref 39.0–52.0)
Hemoglobin: 13.6 g/dL (ref 13.0–17.0)
MCH: 28.7 pg (ref 26.0–34.0)
MCHC: 33.3 g/dL (ref 30.0–36.0)
MCV: 86.3 fL (ref 80.0–100.0)
Platelets: 303 10*3/uL (ref 150–400)
RBC: 4.74 MIL/uL (ref 4.22–5.81)
RDW: 12.4 % (ref 11.5–15.5)
WBC: 7.8 10*3/uL (ref 4.0–10.5)
nRBC: 0 % (ref 0.0–0.2)

## 2023-09-01 LAB — ACETAMINOPHEN LEVEL: Acetaminophen (Tylenol), Serum: <10 ug/mL — ABNORMAL LOW (ref 10–30)

## 2023-09-01 LAB — ETHANOL: Alcohol, Ethyl (B): <10 mg/dL (ref <10)

## 2023-09-01 LAB — RAPID URINE DRUG SCREEN, HOSP PERFORMED
Amphetamines: NOT DETECTED
Barbiturates: NOT DETECTED
Benzodiazepines: NOT DETECTED
Cocaine: NOT DETECTED
Opiates: NOT DETECTED
Tetrahydrocannabinol: NOT DETECTED

## 2023-09-01 LAB — COMPREHENSIVE METABOLIC PANEL
ALT: 22 U/L (ref 0–44)
AST: 19 U/L (ref 15–41)
Albumin: 4.3 g/dL (ref 3.5–5.0)
Alkaline Phosphatase: 55 U/L (ref 38–126)
Anion gap: 13 (ref 5–15)
BUN: 14 mg/dL (ref 6–20)
CO2: 23 mmol/L (ref 22–32)
Calcium: 9.8 mg/dL (ref 8.9–10.3)
Chloride: 106 mmol/L (ref 98–111)
Creatinine, Ser: 0.81 mg/dL (ref 0.61–1.24)
GFR, Estimated: >60 mL/min (ref >60)
Glucose, Bld: 76 mg/dL (ref 70–99)
Potassium: 3.9 mmol/L (ref 3.5–5.1)
Sodium: 142 mmol/L (ref 135–145)
Total Bilirubin: 0.7 mg/dL (ref 0.3–1.2)
Total Protein: 7.3 g/dL (ref 6.5–8.1)

## 2023-09-01 LAB — SALICYLATE LEVEL: Salicylate Lvl: <7 mg/dL — ABNORMAL LOW (ref 7.0–30.0)

## 2023-09-01 MED ORDER — LORAZEPAM 2 MG/ML IJ SOLN
2.0000 mg | INTRAMUSCULAR | Status: AC
  Administered 2023-09-01: 2 mg via INTRAMUSCULAR
  Filled 2023-09-01: qty 1

## 2023-09-01 MED ORDER — DIPHENHYDRAMINE HCL 50 MG/ML IJ SOLN
50.0000 mg | Freq: Once | INTRAMUSCULAR | Status: AC
  Administered 2023-09-01: 50 mg via INTRAMUSCULAR
  Filled 2023-09-01: qty 1

## 2023-09-01 MED ORDER — INSULIN ASPART 100 UNIT/ML IJ SOLN
0.0000 [IU] | Freq: Three times a day (TID) | INTRAMUSCULAR | Status: DC
  Administered 2023-09-02: 5 [IU] via SUBCUTANEOUS
  Administered 2023-09-02: 3 [IU] via SUBCUTANEOUS
  Administered 2023-09-02: 2 [IU] via SUBCUTANEOUS
  Administered 2023-09-03: 11 [IU] via SUBCUTANEOUS

## 2023-09-01 MED ORDER — HALOPERIDOL LACTATE 5 MG/ML IJ SOLN
5.0000 mg | INTRAMUSCULAR | Status: AC
  Administered 2023-09-01: 5 mg via INTRAMUSCULAR
  Filled 2023-09-01: qty 1

## 2023-09-01 NOTE — ED Notes (Signed)
Pt agitation increased. Pt arguing with wall "(derogatory term) I ain't slow. Leave me alone.". Nurse made aware.
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
## 2023-09-01 NOTE — ED Notes (Signed)
Case#: [id number]
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
## 2023-09-02 DIAGNOSIS — F259 Schizoaffective disorder, unspecified: Secondary | ICD-10-CM | POA: Diagnosis not present

## 2023-09-02 LAB — CBG MONITORING, ED
Glucose-Capillary: 217 mg/dL — ABNORMAL HIGH (ref 70–99)
Glucose-Capillary: 144 mg/dL — ABNORMAL HIGH (ref 70–99)
Glucose-Capillary: 168 mg/dL — ABNORMAL HIGH (ref 70–99)
Glucose-Capillary: 261 mg/dL — ABNORMAL HIGH (ref 70–99)

## 2023-09-02 LAB — HIV ANTIBODY (ROUTINE TESTING W REFLEX): HIV Screen 4th Generation wRfx: NONREACTIVE

## 2023-09-02 LAB — RPR: RPR Ser Ql: NONREACTIVE

## 2023-09-02 MED ORDER — DIVALPROEX SODIUM 250 MG PO DR TAB
250.0000 mg | DELAYED_RELEASE_TABLET | Freq: Every day | ORAL | Status: DC
  Filled 2023-09-02: qty 1

## 2023-09-02 MED ORDER — PALIPERIDONE ER 6 MG PO TB24
6.0000 mg | ORAL_TABLET | Freq: Every day | ORAL | Status: DC
  Administered 2023-09-02 – 2023-09-03 (×2): 6 mg via ORAL
  Filled 2023-09-02 (×2): qty 1

## 2023-09-02 MED ORDER — INSULIN DETEMIR 100 UNIT/ML ~~LOC~~ SOLN
30.0000 [IU] | Freq: Every day | SUBCUTANEOUS | Status: DC
  Administered 2023-09-02 – 2023-09-03 (×2): 30 [IU] via SUBCUTANEOUS
  Filled 2023-09-02 (×2): qty 0.3
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
## 2023-09-02 NOTE — ED Notes (Signed)
Assumed care of patient, NAD noted at this time, pt ambulatory in room, respirations even and unlabored, skin warm and dry, bed in low position, sitter with patient. Comfort measures offered. Will continue to monitor. Pt IVC in place, pt asking when he is going home, advised pt that he was going to be staying until further arrangements have been made. Pt verbalized understanding.
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
## 2023-09-02 NOTE — ED Notes (Signed)
Pt agreeable to BG stick.

## 2023-09-02 NOTE — ED Notes (Signed)
Spoke with Jacob [name] at [facility], pt is accepted, will not be able to until tomorrow. Waiting for information for report and destination.

## 2023-09-02 NOTE — Progress Notes (Signed)
Pt was accepted to [facility] TOMORROW 09/03/2023; Bed Assignment Deatra Canter A PENDING Negative COVID faxed to [facility] at [phone].  Pt meets inpatient criteria per Joaquim Nam  Attending Physician will be Umalakshmi K. Robet Leu, MD  Report can be called to: [phone]  Pt can arrive after: 8:00am  Care Team notified:[facility], LCSWA 09/02/2023 @ 10:09 PM

## 2023-09-02 NOTE — ED Notes (Signed)
Report received from Roselyn Reef. RN. Assumed care of pt at this time. Pt is IVC'd and has no sitter. Per previous RN, pt still has his cell phone and is not dressed into appropriate psych scrubs. No sitter has been with pt since being IVCd. Per previous RN, pt is a flight risk. GC/Chlamydia still pending. Need urine sample to send off test for ordered labs. Order is in [facility] for continuous 1:1 observation.

## 2023-09-02 NOTE — ED Notes (Signed)
EM provider at bedside.

## 2023-09-02 NOTE — ED Notes (Signed)
Expire Nov7 2024

## 2023-09-02 NOTE — ED Notes (Signed)
Pt pacing in room.

## 2023-09-02 NOTE — ED Notes (Signed)
Pt changed into purple scrubs without difficulty. Hospital socks placed on pt. All belongings placed in one belongings bag which consisted of a jacket, tshirt, pants, socks, shoes, and one cell phone. Currently denying SI. Pt drowsy, but arousable to verbal stimuli. Still awaiting safety attendant.

## 2023-09-02 NOTE — ED Notes (Signed)
Pt refusing to eat at this time.

## 2023-09-02 NOTE — ED Notes (Signed)
Pt requested food.

## 2023-09-02 NOTE — ED Notes (Signed)
Patient unable to do TTS at this time  patient sleeping due to medication given prior
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
## 2023-09-02 NOTE — ED Provider Notes (Signed)
Emergency Medicine Observation Re-evaluation Note  Jacob [name] is a 32 y.o. male, seen on rounds today.  Pt initially presented to the ED for complaints of No chief complaint on file. Currently, the patient is resting comfortably.  Awaiting psychiatric evaluation for delusions.  Patient with history of schizoaffective, bipolar disorder, type 1 diabetes.   Physical Exam  BP 118/82 (BP Location: Right Arm)   Pulse 77   Temp 97.8 F (36.6 C) (Temporal)   Resp 18   Ht 5\' 9"  (1.753 m)   Wt 56.7 kg   SpO2 100%   BMI 18.46 kg/m  Physical Exam General: Resting comfortably, arouses easily Lungs: Normal respiratory effort Psych: Calm, cooperative  ED Course / MDM  EKG:   I have reviewed the labs performed to date as well as medications administered while in observation.  Recent changes in the last 24 hours include only sliding scale insulin ordered.  Plan  Current plan is for psychiatric evaluation.  I reviewed patient's history.  Has history of type 1 diabetes and is usually on insulin meals, sliding scale, and 30 units of Levemir in the morning.  He has a recent note from the 21st from his PCP that confirms this and patient confirms this as well.  His blood sugar here was last normal.  I added on his basal insulin given he is type I diabetic, he also has sliding scale.  Will continue to monitor.    Laurence Spates, MD 09/02/23 [phone]

## 2023-09-02 NOTE — ED Notes (Signed)
Psyh provider at bedside.

## 2023-09-02 NOTE — ED Notes (Signed)
IVC CURRENT PATIENT HAS BEEN MOVED TO YELLOW COPIES ON THE CLIPBOARD IN BLUE ZONE

## 2023-09-02 NOTE — BH Assessment (Addendum)
IRIS provider attempted to assess pt. Per Victorino Dike, charge RN, pt is too somnolent for assessment after taking Ativan. TTS will be notified when pt is awake and alert for evaluation.

## 2023-09-02 NOTE — Progress Notes (Signed)
LCSW Progress Note  [id number]   Jacob [name]  09/02/2023  9:54 PM    Inpatient Behavioral Health Placement  Pt meets inpatient criteria per Phebe Colla, NP. There are no available beds within [facility] system per [facility] [employer]. Referral was sent to the following facilities;   Destination  Service Provider Address Phone [facility]  [address] [place], [place] [address] [phone]  [facility]  [address] [place] [address] [phone]  [facility] Patient Placement  [facility], [place] [phone]  [facility]  [address] [place] [address] [phone]  [facility]  [address] [place] [address] [phone]  [facility]  [address] [place], [place] [address] [phone]  [facility]  [address] [place] [address] [phone]  [facility]  [address] [place] [address] [phone]  [facility]  [address] [place] [address] [phone]  [facility]  [address], [place] [address] [phone]  [facility]  [address] [place] [address] [phone]  [facility]  [address], [place] [address] [phone]  [facility]  [place] [phone]  [facility]  [address] [place] [address] [phone]  [facility]  [address] [place], [place] [phone]  [facility]  [address], [place] [address]  [phone]  [facility]  [address], [place] [address] [phone]  [facility]  [address], [place] [address] [phone]  [facility]  [address], [place] [address] [phone]  [facility]  [address] [place] [address] [phone]  [facility]  [address], [place] [address] [phone]  [facility]  [place] [phone]  [facility]  [address] [place] [address] [phone]  [facility]  [address] [place], [place] [address] [phone]  [facility]  [address] [place] [address] [phone]  [facility] [address]  [address], [place] [address] [phone]  [facility]  [address], [place] [address] [phone]     Situation ongoing,  CSW will follow up.    Maryjean Ka, MSW, [facility] 09/02/2023 9:54 PM

## 2023-09-03 LAB — RESP PANEL BY RT-PCR (RSV, FLU A&B, COVID)  RVPGX2
Influenza A by PCR: NEGATIVE
Influenza B by PCR: NEGATIVE
Resp Syncytial Virus by PCR: NEGATIVE
SARS Coronavirus 2 by RT PCR: NEGATIVE

## 2023-09-03 LAB — CBG MONITORING, ED
Glucose-Capillary: 328 mg/dL — ABNORMAL HIGH (ref 70–99)
Glucose-Capillary: 368 mg/dL — ABNORMAL HIGH (ref 70–99)

## 2023-09-03 NOTE — ED Provider Notes (Signed)
Emergency Medicine Observation Re-evaluation Note  Wayburn Mccarter is a 32 y.o. male, seen on rounds today.  Pt initially presented to the ED for complaints of No chief complaint on file. Currently, the patient is awaiting transport.  Physical Exam  BP 132/78 (BP Location: Right Arm)   Pulse 75   Temp 98 F (36.7 C) (Oral)   Resp 17   Ht 5\' 9"  (1.753 m)   Wt 56.7 kg   SpO2 98%   BMI 18.46 kg/m  Physical Exam Cardiovascular:     Rate and Rhythm: Normal rate.  Pulmonary:     Effort: Pulmonary effort is normal.  Neurological:     General: No focal deficit present.     Mental Status: He is alert.  Psychiatric:        Mood and Affect: Mood normal.     ED Course / MDM  EKG:   I have reviewed the labs performed to date as well as medications administered while in observation.  Recent changes in the last 24 hours include patient received a bed..  Plan  Current plan is for transport today to [facility].    Anders Simmonds T, DO 09/03/23 1144

## 2023-09-03 NOTE — ED Notes (Signed)
Covid [facility]ed [facility]

## 2023-09-03 NOTE — ED Notes (Signed)
Report given to [facility] RN at [employer].

## 2023-09-27 ENCOUNTER — Telehealth: Payer: Self-pay

## 2023-09-27 NOTE — Telephone Encounter (Signed)
Received a rx from the pharmacy regarding a rx for levemir flextouch, per the pharmacy this medication has been discontinued please send in a alternative medication.

## 2023-10-02 ENCOUNTER — Other Ambulatory Visit: Payer: Self-pay

## 2023-10-02 ENCOUNTER — Emergency Department (HOSPITAL_COMMUNITY)
Admission: EM | Admit: 2023-10-02 | Discharge: 2023-10-02 | Disposition: A | Discharge status: Home / Self Care | Payer: MEDICAID | Attending: Emergency Medicine | Admitting: Emergency Medicine

## 2023-10-02 DIAGNOSIS — E11649 Type 2 diabetes mellitus with hypoglycemia without coma: Secondary | ICD-10-CM | POA: Diagnosis present

## 2023-10-02 DIAGNOSIS — X31XXXA Exposure to excessive natural cold, initial encounter: Secondary | ICD-10-CM | POA: Diagnosis not present

## 2023-10-02 DIAGNOSIS — Z794 Long term (current) use of insulin: Secondary | ICD-10-CM | POA: Diagnosis not present

## 2023-10-02 DIAGNOSIS — E162 Hypoglycemia, unspecified: Secondary | ICD-10-CM

## 2023-10-02 DIAGNOSIS — T68XXXA Hypothermia, initial encounter: Secondary | ICD-10-CM | POA: Insufficient documentation

## 2023-10-02 LAB — CBG MONITORING, ED
Glucose-Capillary: 102 mg/dL — ABNORMAL HIGH (ref 70–99)
Glucose-Capillary: 71 mg/dL (ref 70–99)
Glucose-Capillary: 126 mg/dL — ABNORMAL HIGH (ref 70–99)
Glucose-Capillary: 44 mg/dL — CL (ref 70–99)
Glucose-Capillary: 273 mg/dL — ABNORMAL HIGH (ref 70–99)

## 2023-10-02 LAB — COMPREHENSIVE METABOLIC PANEL
ALT: 42 U/L (ref 0–44)
AST: 28 U/L (ref 15–41)
Albumin: 4.9 g/dL (ref 3.5–5.0)
Alkaline Phosphatase: 74 U/L (ref 38–126)
Anion gap: 13 (ref 5–15)
BUN: 16 mg/dL (ref 6–20)
CO2: 26 mmol/L (ref 22–32)
Calcium: 10.4 mg/dL — ABNORMAL HIGH (ref 8.9–10.3)
Chloride: 100 mmol/L (ref 98–111)
Creatinine, Ser: 0.84 mg/dL (ref 0.61–1.24)
GFR, Estimated: >60 mL/min (ref >60)
Glucose, Bld: 123 mg/dL — ABNORMAL HIGH (ref 70–99)
Potassium: 3.8 mmol/L (ref 3.5–5.1)
Sodium: 139 mmol/L (ref 135–145)
Total Bilirubin: 0.7 mg/dL (ref <1.2)
Total Protein: 8.3 g/dL — ABNORMAL HIGH (ref 6.5–8.1)

## 2023-10-02 LAB — CBC WITH DIFFERENTIAL/PLATELET
Abs Immature Granulocytes: 0.02 10*3/uL (ref 0.00–0.07)
Basophils Absolute: 0 10*3/uL (ref 0.0–0.1)
Basophils Relative: 1 %
Eosinophils Absolute: 0.1 10*3/uL (ref 0.0–0.5)
Eosinophils Relative: 1 %
HCT: 45 % (ref 39.0–52.0)
Hemoglobin: 14.3 g/dL (ref 13.0–17.0)
Immature Granulocytes: 0 %
Lymphocytes Relative: 32 %
Lymphs Abs: 2.7 10*3/uL (ref 0.7–4.0)
MCH: 28.4 pg (ref 26.0–34.0)
MCHC: 31.8 g/dL (ref 30.0–36.0)
MCV: 89.3 fL (ref 80.0–100.0)
Monocytes Absolute: 0.8 10*3/uL (ref 0.1–1.0)
Monocytes Relative: 9 %
Neutro Abs: 4.9 10*3/uL (ref 1.7–7.7)
Neutrophils Relative %: 57 %
Platelets: 442 10*3/uL — ABNORMAL HIGH (ref 150–400)
RBC: 5.04 MIL/uL (ref 4.22–5.81)
RDW: 13.2 % (ref 11.5–15.5)
WBC: 8.4 10*3/uL (ref 4.0–10.5)
nRBC: 0 % (ref 0.0–0.2)

## 2023-10-02 LAB — RAPID URINE DRUG SCREEN, HOSP PERFORMED
Amphetamines: NOT DETECTED
Barbiturates: NOT DETECTED
Benzodiazepines: NOT DETECTED
Cocaine: NOT DETECTED
Opiates: NOT DETECTED
Tetrahydrocannabinol: NOT DETECTED

## 2023-10-02 LAB — I-STAT CHEM 8, ED
BUN: 19 mg/dL (ref 6–20)
Calcium, Ion: 1.27 mmol/L (ref 1.15–1.40)
Chloride: 100 mmol/L (ref 98–111)
Creatinine, Ser: 0.7 mg/dL (ref 0.61–1.24)
Glucose, Bld: 121 mg/dL — ABNORMAL HIGH (ref 70–99)
HCT: 48 % (ref 39.0–52.0)
Hemoglobin: 16.3 g/dL (ref 13.0–17.0)
Potassium: 3.7 mmol/L (ref 3.5–5.1)
Sodium: 139 mmol/L (ref 135–145)
TCO2: 29 mmol/L (ref 22–32)

## 2023-10-02 LAB — I-STAT CG4 LACTIC ACID, ED
Lactic Acid, Venous: 2.4 mmol/L (ref 0.5–1.9)
Lactic Acid, Venous: 0.4 mmol/L — ABNORMAL LOW (ref 0.5–1.9)
Lactic Acid, Venous: 0.8 mmol/L (ref 0.5–1.9)

## 2023-10-02 LAB — CK: Total CK: 430 U/L — ABNORMAL HIGH (ref 49–397)

## 2023-10-02 LAB — PROTIME-INR
INR: 1.1 (ref 0.8–1.2)
Prothrombin Time: 13.9 s (ref 11.4–15.2)

## 2023-10-02 LAB — ETHANOL: Alcohol, Ethyl (B): <10 mg/dL (ref <10)

## 2023-10-02 MED ORDER — SODIUM CHLORIDE 0.9 % IV BOLUS
1000.0000 mL | Freq: Once | INTRAVENOUS | Status: AC
Start: 2023-10-02 — End: 2023-10-02
  Administered 2023-10-02: 1000 mL via INTRAVENOUS

## 2023-10-02 MED ORDER — DEXTROSE 50 % IV SOLN
1.0000 | INTRAVENOUS | Status: AC
  Administered 2023-10-02: 50 mL via INTRAVENOUS
  Filled 2023-10-02: qty 50

## 2023-10-02 NOTE — ED Notes (Signed)
Patient discharged by this RN. Patient verbalizes understanding of discharge instructions with no additional questions for this RN. Ambulatory to lobby at time of discharge.

## 2023-10-02 NOTE — Discharge Instructions (Addendum)
I would hold off on your insulin for at least the next 24 hours.  Please call your family doctor in the morning and have a discussion with them about your visit and see if they want to change any of your medications.

## 2023-10-02 NOTE — ED Triage Notes (Addendum)
Patient brought in by [facility] EMS for hypoglycemia. Picked up by PD, found in road appearing lethargic and diaphoreticInitially 45, given 30 oral grams glucose, last CBG 60, slightly confused, no insulin or food today. Alert and oriented x4, slowed responses. Patient states he has been outside for unknown amount of time, unsure of last meal. Patient has not had insulin today. Patient endorses alcohol use, denies drug use.   EMS vitals  BP 100/70 HR 50 O2 95 room air
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
## 2023-10-02 NOTE — ED Notes (Signed)
Patient states he is unable to urinate at this time.

## 2023-10-02 NOTE — ED Notes (Signed)
Patient provided orange juice and graham crackers.

## 2023-10-02 NOTE — ED Notes (Signed)
Bair hugger placed on patient.  

## 2023-10-02 NOTE — ED Notes (Signed)
Warm NS fluid given

## 2023-10-02 NOTE — ED Notes (Signed)
MD Adela Lank made aware of CBG 44, MD Adela Lank to put in order for D50 amp.

## 2023-10-02 NOTE — ED Notes (Signed)
Per MD Belfi, not to collect repeat lactic acid

## 2023-10-02 NOTE — ED Notes (Signed)
Bair hugger removed  

## 2023-10-02 NOTE — ED Provider Notes (Signed)
[place] EMERGENCY DEPARTMENT AT [facility] Provider Note   CSN: [id number] Arrival date & time: 10/02/23  4098     History  Chief Complaint  Patient presents with   Hypoglycemia    Linell Kobs is a 32 y.o. male.  Patient is a 32 year old male who presents with low blood sugar.  He was found by [employer] on the street.  He was somewhat lethargic and confused and EMS had noted his blood sugar was 45.  He was given oral glucose and seem to have improved.  He states that he had been drinking some alcohol last night.  He says that he drinks daily.  He denies any drug use.  He says that he was banging on the door of his house and his mom called the sheriff's deputy last night and [employer] took him downtown to one of the homeless shelters.  However he said that they did not have a room for him and he slept outside during the night.  He denies any recent illnesses.  Other than feeling very cold, he denies any complaints.       Home Medications Prior to Admission medications   Medication Sig Start Date End Date Taking? Authorizing Provider  BD PEN NEEDLE NANO 2ND GEN 32G X 4 MM MISC USE TO INJECT INSULIN 4 TIMES A DAY 06/27/23  Yes [name], MD  insulin aspart (NOVOLOG FLEXPEN) 100 UNIT/ML FlexPen Inject 7 units before breakfast, lunch and dinner 02/25/23  Yes Amponsah, Flossie Buffy, MD  insulin detemir (LEVEMIR FLEXPEN) 100 UNIT/ML FlexPen INJECT 30 UNITS INTO THE SKIN DAILY 03/02/23  Yes Steffanie Rainwater, MD  Continuous Glucose Receiver (DEXCOM G7 RECEIVER) DEVI Use with Dexcom G7 sensors to continuously monitor blood sugar. Patient not taking: Reported on 10/02/2023 08/22/23   Katheran James, DO  Continuous Glucose Sensor (DEXCOM G7 SENSOR) MISC Place new sensor every 10 days. Use to continuously monitor blood sugar. Patient not taking: Reported on 10/02/2023 08/22/23   Katheran James, DO  Continuous Glucose Transmitter (DEXCOM G6 TRANSMITTER) MISC USE AS DIRECTED  FOR CONTINUOUS GLUCOSE MONITORING. USE TRANSMITTER FOR 90 DAYS. DISCARD AND REPLACE. Patient not taking: Reported on 10/02/2023 08/05/23   [name], MD  glucagon 1 MG injection Inject 1 mg into the skin See admin instructions. Follow package directions for low blood sugar. 06/10/22 09/01/23  Steffanie Rainwater, MD      Allergies    Patient has no known allergies.    Review of Systems   Review of Systems  Constitutional:  Positive for fatigue. Negative for chills, diaphoresis and fever.  HENT:  Negative for congestion, rhinorrhea and sneezing.   Eyes: Negative.   Respiratory:  Negative for cough, chest tightness and shortness of breath.   Cardiovascular:  Negative for chest pain and leg swelling.  Gastrointestinal:  Negative for abdominal pain, blood in stool, diarrhea, nausea and vomiting.  Genitourinary:  Negative for difficulty urinating, flank pain, frequency and hematuria.  Musculoskeletal:  Negative for arthralgias and back pain.  Skin:  Negative for rash.  Neurological:  Positive for tremors (Shivering). Negative for dizziness, speech difficulty, weakness, numbness and headaches.    Physical Exam Updated Vital Signs BP 120/80 (BP Location: Right Arm)   Pulse 66   Temp 98.1 F (36.7 C) (Oral)   Resp 13   Ht 5\' 9"  (1.753 m)   Wt 56.7 kg   SpO2 100%   BMI 18.46 kg/m  Physical Exam Constitutional:  Appearance: He is well-developed.  HENT:     Head: Normocephalic and atraumatic.  Eyes:     Pupils: Pupils are equal, round, and reactive to light.  Cardiovascular:     Rate and Rhythm: Normal rate and regular rhythm.     Heart sounds: Normal heart sounds.  Pulmonary:     Effort: Pulmonary effort is normal. No respiratory distress.     Breath sounds: Normal breath sounds. No wheezing or rales.  Chest:     Chest wall: No tenderness.  Abdominal:     General: Bowel sounds are normal.     Palpations: Abdomen is soft.     Tenderness: There is no abdominal tenderness.  There is no guarding or rebound.  Musculoskeletal:        General: Normal range of motion.     Cervical back: Normal range of motion and neck supple.     Comments: Episodes of intense shivering  Lymphadenopathy:     Cervical: No cervical adenopathy.  Skin:    General: Skin is warm and dry.     Findings: No rash.  Neurological:     General: No focal deficit present.     Mental Status: He is alert and oriented to person, place, and time.     ED Results / Procedures / Treatments   Labs (all labs ordered are listed, but only abnormal results are displayed) Labs Reviewed  COMPREHENSIVE METABOLIC PANEL - Abnormal; Notable for the following components:      Result Value   Glucose, Bld 123 (*)    Calcium 10.4 (*)    Total Protein 8.3 (*)    All other components within normal limits  CBC WITH DIFFERENTIAL/PLATELET - Abnormal; Notable for the following components:   Platelets 442 (*)    All other components within normal limits  CK - Abnormal; Notable for the following components:   Total CK 430 (*)    All other components within normal limits  CBG MONITORING, ED - Abnormal; Notable for the following components:   Glucose-Capillary 102 (*)    All other components within normal limits  I-STAT CHEM 8, ED - Abnormal; Notable for the following components:   Glucose, Bld 121 (*)    All other components within normal limits  I-STAT CG4 LACTIC ACID, ED - Abnormal; Notable for the following components:   Lactic Acid, Venous 2.4 (*)    All other components within normal limits  I-STAT CG4 LACTIC ACID, ED - Abnormal; Notable for the following components:   Lactic Acid, Venous 0.4 (*)    All other components within normal limits  CBG MONITORING, ED - Abnormal; Notable for the following components:   Glucose-Capillary 126 (*)    All other components within normal limits  PROTIME-INR  ETHANOL  RAPID URINE DRUG SCREEN, HOSP PERFORMED  CBG MONITORING, ED  I-STAT CG4 LACTIC ACID, ED     EKG EKG Interpretation Date/Time:  Sunday October 02 2023 08:30:59 EST Ventricular Rate:  69 PR Interval:    QRS Duration:  100 QT Interval:  432 QTC Calculation: 463 R Axis:   85  Text Interpretation: Normal sinus rhythm since last tracing no significant change Confirmed by Rolan Bucco [phone]) on 10/02/2023 9:12:36 AM  Radiology No results found.  Procedures Procedures    Medications Ordered in ED Medications  sodium chloride 0.9 % bolus 1,000 mL (0 mLs Intravenous Stopped 10/02/23 1100)    ED Course/ Medical Decision Making/ A&P  Medical Decision Making Amount and/or Complexity of Data Reviewed Labs: ordered.   Patient is a 32 year old who presents with hypoglycemia.  He is an insulin-dependent diabetic.  He was found outside and was also noted to be hypothermic.  He had a core temperature of 90.  He was placed on a Lawyer.  He was given glucose.  He was given a meal.  His glucose remained fairly stable.  It did drop down to the 70s but then went back up to 126.  His initial lactate was elevated but this is cleared.  He was given some warm fluids.  His core temp has normalized.  He has feeling back to normal.  His shivering has resolved.  Overall feeling better and I was going to discharge him and he advises me that he just took a dose of his home insulin.  He reports that he took 30 units of his Levemir and 7 units of his NovoLog.  Given this, feel like we need to monitor him for at least another hour and make sure that his glucose is stable.  Care turned over to Dr. Adela Lank pending reevaluation.  Final Clinical Impression(s) / ED Diagnoses Final diagnoses:  Hypoglycemia  Hypothermia, initial encounter    Rx / DC Orders ED Discharge Orders     None         Rolan Bucco, MD 10/02/23 1526

## 2023-10-03 ENCOUNTER — Emergency Department (HOSPITAL_COMMUNITY)
Admission: EM | Admit: 2023-10-03 | Discharge: 2023-10-03 | Disposition: A | Discharge status: Home / Self Care | Payer: MEDICAID | Attending: Emergency Medicine | Admitting: Emergency Medicine

## 2023-10-03 ENCOUNTER — Emergency Department (HOSPITAL_COMMUNITY)
Admission: EM | Admit: 2023-10-03 | Discharge: 2023-10-04 | Disposition: A | Discharge status: Home / Self Care | Payer: MEDICAID | Source: Home / Self Care | Attending: Emergency Medicine | Admitting: Emergency Medicine

## 2023-10-03 ENCOUNTER — Other Ambulatory Visit: Payer: Self-pay

## 2023-10-03 ENCOUNTER — Encounter (HOSPITAL_COMMUNITY): Payer: Self-pay

## 2023-10-03 DIAGNOSIS — Z5189 Encounter for other specified aftercare: Secondary | ICD-10-CM

## 2023-10-03 DIAGNOSIS — W19XXXA Unspecified fall, initial encounter: Secondary | ICD-10-CM | POA: Diagnosis not present

## 2023-10-03 DIAGNOSIS — S8992XA Unspecified injury of left lower leg, initial encounter: Secondary | ICD-10-CM | POA: Diagnosis present

## 2023-10-03 DIAGNOSIS — Z794 Long term (current) use of insulin: Secondary | ICD-10-CM | POA: Insufficient documentation

## 2023-10-03 DIAGNOSIS — E109 Type 1 diabetes mellitus without complications: Secondary | ICD-10-CM | POA: Insufficient documentation

## 2023-10-03 DIAGNOSIS — Z4800 Encounter for change or removal of nonsurgical wound dressing: Secondary | ICD-10-CM | POA: Insufficient documentation

## 2023-10-03 DIAGNOSIS — T23001A Burn of unspecified degree of right hand, unspecified site, initial encounter: Secondary | ICD-10-CM | POA: Insufficient documentation

## 2023-10-03 DIAGNOSIS — Z5900 Homelessness unspecified: Secondary | ICD-10-CM | POA: Insufficient documentation

## 2023-10-03 DIAGNOSIS — S80212A Abrasion, left knee, initial encounter: Secondary | ICD-10-CM | POA: Diagnosis not present

## 2023-10-03 DIAGNOSIS — X088XXA Exposure to other specified smoke, fire and flames, initial encounter: Secondary | ICD-10-CM | POA: Insufficient documentation

## 2023-10-03 MED ORDER — BACITRACIN ZINC 500 UNIT/GM EX OINT
TOPICAL_OINTMENT | Freq: Once | CUTANEOUS | Status: AC
  Administered 2023-10-03: 1 via TOPICAL
  Filled 2023-10-03: qty 0.9

## 2023-10-03 MED ORDER — INSULIN GLARGINE 100 UNIT/ML ~~LOC~~ SOLN: SUBCUTANEOUS | 6 refills | Status: DC

## 2023-10-03 NOTE — Addendum Note (Signed)
Addended by: Katheran James on: 10/03/2023 11:35 AM   Modules accepted: Orders

## 2023-10-03 NOTE — ED Triage Notes (Signed)
Pt to ED POV from [employer]. Pt states he burned his right hand on water while cooking noodles 2 weeks ago. Pt requesting topical cream for discomfort. Burn has begun to scab over. Pt denies any fevers at home for feeling sick.

## 2023-10-03 NOTE — Telephone Encounter (Signed)
Please advise 

## 2023-10-03 NOTE — ED Triage Notes (Addendum)
Pt presents via POV c/o left knee pain. Reports fell yesterday and has a scrape on his left knee. Ambulatory to triage. Reports wanting abx for scrape on knee.   Denies SI/HI

## 2023-10-03 NOTE — Discharge Instructions (Signed)
If you develop life-threatening symptoms return to the emergency department

## 2023-10-03 NOTE — ED Provider Notes (Signed)
[place] EMERGENCY DEPARTMENT AT [facility] Provider Note   CSN: [id number] Arrival date & time: 10/03/23  7829     History  Chief Complaint  Patient presents with   Knee Pain    Jacob [name] is a 32 y.o. male. Patient with past medical history significant for uncontrolled type 1 DM, MDD, schizoaffective disorder presents to the emergency department complaining of an abrasion of the left knee after a fall. Patient reportedly kicked out of his home recently. No other complaints   Knee Pain      Home Medications Prior to Admission medications   Medication Sig Start Date End Date Taking? Authorizing Provider  BD PEN NEEDLE NANO 2ND GEN 32G X 4 MM MISC USE TO INJECT INSULIN 4 TIMES A DAY 06/27/23   [name], MD  Continuous Glucose Receiver (DEXCOM G7 RECEIVER) DEVI Use with Dexcom G7 sensors to continuously monitor blood sugar. Patient not taking: Reported on 10/02/2023 08/22/23   Katheran James, DO  Continuous Glucose Sensor (DEXCOM G7 SENSOR) MISC Place new sensor every 10 days. Use to continuously monitor blood sugar. Patient not taking: Reported on 10/02/2023 08/22/23   Katheran James, DO  Continuous Glucose Transmitter (DEXCOM G6 TRANSMITTER) MISC USE AS DIRECTED FOR CONTINUOUS GLUCOSE MONITORING. USE TRANSMITTER FOR 90 DAYS. DISCARD AND REPLACE. Patient not taking: Reported on 10/02/2023 08/05/23   [name], MD  glucagon 1 MG injection Inject 1 mg into the skin See admin instructions. Follow package directions for low blood sugar. 06/10/22 09/01/23  Steffanie Rainwater, MD  insulin aspart (NOVOLOG FLEXPEN) 100 UNIT/ML FlexPen Inject 7 units before breakfast, lunch and dinner 02/25/23   Steffanie Rainwater, MD  insulin detemir (LEVEMIR FLEXPEN) 100 UNIT/ML FlexPen INJECT 30 UNITS INTO THE SKIN DAILY 03/02/23   Steffanie Rainwater, MD      Allergies    Patient has no known allergies.    Review of Systems   Review of Systems  Physical Exam Updated  Vital Signs BP (!) 140/101 (BP Location: Right Arm)   Pulse 80   Temp 98 F (36.7 C) (Oral)   Resp 18   SpO2 100%  Physical Exam Vitals and nursing note reviewed.  HENT:     Head: Normocephalic and atraumatic.  Eyes:     Conjunctiva/sclera: Conjunctivae normal.  Cardiovascular:     Rate and Rhythm: Normal rate.  Pulmonary:     Effort: Pulmonary effort is normal. No respiratory distress.  Musculoskeletal:        General: No signs of injury.     Cervical back: Normal range of motion.  Skin:    General: Skin is dry.     Comments: Abrasion noted to left knee  Neurological:     Mental Status: He is alert.  Psychiatric:        Speech: Speech normal.        Behavior: Behavior normal.     ED Results / Procedures / Treatments   Labs (all labs ordered are listed, but only abnormal results are displayed) Labs Reviewed - No data to display  EKG None  Radiology No results found.  Procedures Procedures    Medications Ordered in ED Medications  bacitracin ointment (1 Application Topical Given 10/03/23 0547)    ED Course/ Medical Decision Making/ A&P                                 Medical Decision Making  Risk OTC drugs.   Patient presents complaining of an abrasion. No ROM difficulties. No signs of significant life threatening condition. Patient provided with antibiotic ointment at his request. Secondary gain high on differential with current housing insecurity issues. Discharge at this time.         Final Clinical Impression(s) / ED Diagnoses Final diagnoses:  Abrasion of left knee, initial encounter    Rx / DC Orders ED Discharge Orders     None         Pamala Duffel 10/03/23 4132    Palumbo, April, MD 10/03/23 0600

## 2023-10-04 MED ORDER — HYDROCORTISONE 1 % EX CREA: TOPICAL_CREAM | CUTANEOUS | 0 refills | Status: DC

## 2023-10-04 NOTE — ED Notes (Signed)
Introduced myself to patient. Patient states he burnt his hand 2 weeks ago and wanted some ointment to put on the scab. No other complaints. Patient is laying in bed.

## 2023-10-04 NOTE — Discharge Instructions (Signed)
Follow up with your PCP. Return for any concerning symptoms.

## 2023-10-04 NOTE — ED Provider Notes (Signed)
[place] EMERGENCY DEPARTMENT AT [facility] Provider Note   CSN: [id number] Arrival date & time: 10/03/23  2039     History  Chief Complaint  Patient presents with   Hand Burn    Jacob [name] is a 32 y.o. male.  32 y.o. male. Patient with past medical history significant for uncontrolled type 1 DM, MDD, schizoaffective disorder presents for concern of wound to his right hand that has been present for 3 weeks.  He is requesting topical cream.  States he has been applying peroxide to the wound.  Of note he is homeless.  Has had multiple visits recently due to not having a place to stay.  No other complaints at this time.  He was resting comfortably with his eyes closed prior to my interview.  Keeps his eyes closed during the interview after brief eye contact.  The history is provided by the patient. No language interpreter was used.       Home Medications Prior to Admission medications   Medication Sig Start Date End Date Taking? Authorizing Provider  hydrocortisone cream 1 % Apply to affected area 2 times daily 10/04/23  Yes Karie Mainland, Saverio Kader, [name]  BD PEN NEEDLE NANO 2ND GEN 32G X 4 MM MISC USE TO INJECT INSULIN 4 TIMES A DAY 06/27/23   [name], MD  Continuous Glucose Receiver (DEXCOM G7 RECEIVER) DEVI Use with Dexcom G7 sensors to continuously monitor blood sugar. Patient not taking: Reported on 10/02/2023 08/22/23   Katheran James, DO  Continuous Glucose Sensor (DEXCOM G7 SENSOR) MISC Place new sensor every 10 days. Use to continuously monitor blood sugar. Patient not taking: Reported on 10/02/2023 08/22/23   Katheran James, DO  Continuous Glucose Transmitter (DEXCOM G6 TRANSMITTER) MISC USE AS DIRECTED FOR CONTINUOUS GLUCOSE MONITORING. USE TRANSMITTER FOR 90 DAYS. DISCARD AND REPLACE. Patient not taking: Reported on 10/02/2023 08/05/23   [name], MD  glucagon 1 MG injection Inject 1 mg into the skin See admin instructions. Follow package directions for low  blood sugar. 06/10/22 09/01/23  Steffanie Rainwater, MD  insulin aspart (NOVOLOG FLEXPEN) 100 UNIT/ML FlexPen Inject 7 units before breakfast, lunch and dinner 02/25/23   Steffanie Rainwater, MD  insulin glargine (LANTUS) 100 UNIT/ML injection Inject 24 units into the skin daily. 10/03/23 10/02/24  Katheran James, DO      Allergies    Patient has no known allergies.    Review of Systems   Review of Systems  Constitutional:  Negative for chills and fever.  Skin:  Positive for wound.  All other systems reviewed and are negative.   Physical Exam Updated Vital Signs BP 132/82 (BP Location: Right Arm)   Pulse 75   Temp 98.2 F (36.8 C)   Resp 17   SpO2 97%  Physical Exam Vitals and nursing note reviewed.  Constitutional:      General: He is not in acute distress.    Appearance: Normal appearance. He is not ill-appearing.  HENT:     Head: Normocephalic and atraumatic.     Nose: Nose normal.  Eyes:     Conjunctiva/sclera: Conjunctivae normal.  Pulmonary:     Effort: Pulmonary effort is normal. No respiratory distress.  Musculoskeletal:        General: No deformity.  Skin:    Findings: No rash.     Comments: Wound noted to proximal/base of right thumb.  Slightly open.  Does have tissue that appears to be healing.  No surrounding erythema, or purulent drainage.  Neurological:     Mental Status: He is alert.     ED Results / Procedures / Treatments   Labs (all labs ordered are listed, but only abnormal results are displayed) Labs Reviewed - No data to display  EKG None  Radiology No results found.  Procedures Procedures    Medications Ordered in ED Medications - No data to display  ED Course/ Medical Decision Making/ A&P                                 Medical Decision Making  32 year old male presents today for concern of wound to the right hand.  Has been present for 3 weeks.  No signs of an acute infection.  BuSpar prescribed.  Return precaution  discussed.  Hemodynamically stable.  Discharged in stable condition.  Return precaution discussed.   Final Clinical Impression(s) / ED Diagnoses Final diagnoses:  Visit for wound check    Rx / DC Orders ED Discharge Orders          Ordered    hydrocortisone cream 1 %        10/04/23 0546              Marita Kansas, [name] 10/04/23 0551    Mesner, Barbara Cower, MD 10/04/23 4098

## 2023-10-10 ENCOUNTER — Emergency Department (HOSPITAL_COMMUNITY)
Admission: EM | Admit: 2023-10-10 | Discharge: 2023-10-12 | Disposition: A | Discharge status: Other Acute Inpatient Hospital | Payer: MEDICAID | Attending: Emergency Medicine | Admitting: Emergency Medicine

## 2023-10-10 ENCOUNTER — Ambulatory Visit: Payer: MEDICAID | Admitting: Internal Medicine

## 2023-10-10 ENCOUNTER — Telehealth: Payer: Self-pay | Admitting: *Deleted

## 2023-10-10 ENCOUNTER — Other Ambulatory Visit: Payer: Self-pay

## 2023-10-10 DIAGNOSIS — F259 Schizoaffective disorder, unspecified: Secondary | ICD-10-CM | POA: Diagnosis not present

## 2023-10-10 DIAGNOSIS — E1065 Type 1 diabetes mellitus with hyperglycemia: Secondary | ICD-10-CM | POA: Diagnosis present

## 2023-10-10 DIAGNOSIS — R4182 Altered mental status, unspecified: Secondary | ICD-10-CM | POA: Diagnosis present

## 2023-10-10 DIAGNOSIS — E109 Type 1 diabetes mellitus without complications: Secondary | ICD-10-CM

## 2023-10-10 DIAGNOSIS — Z794 Long term (current) use of insulin: Secondary | ICD-10-CM | POA: Insufficient documentation

## 2023-10-10 DIAGNOSIS — F250 Schizoaffective disorder, bipolar type: Secondary | ICD-10-CM | POA: Diagnosis not present

## 2023-10-10 DIAGNOSIS — Z046 Encounter for general psychiatric examination, requested by authority: Secondary | ICD-10-CM

## 2023-10-10 DIAGNOSIS — E10649 Type 1 diabetes mellitus with hypoglycemia without coma: Secondary | ICD-10-CM

## 2023-10-10 DIAGNOSIS — E1165 Type 2 diabetes mellitus with hyperglycemia: Secondary | ICD-10-CM

## 2023-10-10 LAB — BLOOD GAS, VENOUS
Acid-base deficit: 2 mmol/L (ref 0.0–2.0)
Bicarbonate: 25.1 mmol/L (ref 20.0–28.0)
O2 Saturation: 74.7 %
Patient temperature: 37
pCO2, Ven: 51 mm[Hg] (ref 44–60)
pH, Ven: 7.3 (ref 7.25–7.43)
pO2, Ven: 45 mm[Hg] (ref 32–45)

## 2023-10-10 LAB — URINALYSIS, ROUTINE W REFLEX MICROSCOPIC
Bacteria, UA: NONE SEEN
Bilirubin Urine: NEGATIVE
Glucose, UA: >=500 mg/dL — AB
Hgb urine dipstick: NEGATIVE
Ketones, ur: 20 mg/dL — AB
Leukocytes,Ua: NEGATIVE
Nitrite: NEGATIVE
Protein, ur: NEGATIVE mg/dL
Specific Gravity, Urine: 1.029 (ref 1.005–1.030)
pH: 5 (ref 5.0–8.0)

## 2023-10-10 LAB — CBG MONITORING, ED
Glucose-Capillary: 162 mg/dL — ABNORMAL HIGH (ref 70–99)
Glucose-Capillary: 197 mg/dL — ABNORMAL HIGH (ref 70–99)
Glucose-Capillary: 271 mg/dL — ABNORMAL HIGH (ref 70–99)
Glucose-Capillary: 370 mg/dL — ABNORMAL HIGH (ref 70–99)
Glucose-Capillary: 431 mg/dL — ABNORMAL HIGH (ref 70–99)
Glucose-Capillary: 466 mg/dL — ABNORMAL HIGH (ref 70–99)
Glucose-Capillary: 558 mg/dL (ref 70–99)
Glucose-Capillary: >600 mg/dL (ref 70–99)

## 2023-10-10 LAB — I-STAT CHEM 8, ED
BUN: 20 mg/dL (ref 6–20)
Calcium, Ion: 1.17 mmol/L (ref 1.15–1.40)
Chloride: 94 mmol/L — ABNORMAL LOW (ref 98–111)
Creatinine, Ser: 0.9 mg/dL (ref 0.61–1.24)
Glucose, Bld: >700 mg/dL (ref 70–99)
HCT: 48 % (ref 39.0–52.0)
Hemoglobin: 16.3 g/dL (ref 13.0–17.0)
Potassium: 4.8 mmol/L (ref 3.5–5.1)
Sodium: 129 mmol/L — ABNORMAL LOW (ref 135–145)
TCO2: 24 mmol/L (ref 22–32)

## 2023-10-10 LAB — COMPREHENSIVE METABOLIC PANEL
ALT: 29 U/L (ref 0–44)
AST: 19 U/L (ref 15–41)
Albumin: 4.7 g/dL (ref 3.5–5.0)
Alkaline Phosphatase: 97 U/L (ref 38–126)
Anion gap: 14 (ref 5–15)
BUN: 20 mg/dL (ref 6–20)
CO2: 22 mmol/L (ref 22–32)
Calcium: 9.6 mg/dL (ref 8.9–10.3)
Chloride: 93 mmol/L — ABNORMAL LOW (ref 98–111)
Creatinine, Ser: 1.02 mg/dL (ref 0.61–1.24)
GFR, Estimated: >60 mL/min (ref >60)
Glucose, Bld: 805 mg/dL (ref 70–99)
Potassium: 4.7 mmol/L (ref 3.5–5.1)
Sodium: 129 mmol/L — ABNORMAL LOW (ref 135–145)
Total Bilirubin: 1.6 mg/dL — ABNORMAL HIGH (ref <1.2)
Total Protein: 8.2 g/dL — ABNORMAL HIGH (ref 6.5–8.1)

## 2023-10-10 LAB — CBC
HCT: 45.6 % (ref 39.0–52.0)
Hemoglobin: 15.1 g/dL (ref 13.0–17.0)
MCH: 29.3 pg (ref 26.0–34.0)
MCHC: 33.1 g/dL (ref 30.0–36.0)
MCV: 88.4 fL (ref 80.0–100.0)
Platelets: 375 10*3/uL (ref 150–400)
RBC: 5.16 MIL/uL (ref 4.22–5.81)
RDW: 12.4 % (ref 11.5–15.5)
WBC: 9.1 10*3/uL (ref 4.0–10.5)
nRBC: 0 % (ref 0.0–0.2)

## 2023-10-10 LAB — OSMOLALITY: Osmolality: 333 mosm/kg (ref 275–295)

## 2023-10-10 LAB — POCT GLYCOSYLATED HEMOGLOBIN (HGB A1C): Hemoglobin A1C: 11.1 % — AB (ref 4.0–5.6)

## 2023-10-10 LAB — ETHANOL: Alcohol, Ethyl (B): <10 mg/dL (ref <10)

## 2023-10-10 LAB — RAPID URINE DRUG SCREEN, HOSP PERFORMED
Amphetamines: NOT DETECTED
Barbiturates: NOT DETECTED
Benzodiazepines: NOT DETECTED
Cocaine: NOT DETECTED
Opiates: NOT DETECTED
Tetrahydrocannabinol: NOT DETECTED

## 2023-10-10 LAB — GLUCOSE, CAPILLARY: Glucose-Capillary: 381 mg/dL — ABNORMAL HIGH (ref 70–99)

## 2023-10-10 LAB — BETA-HYDROXYBUTYRIC ACID: Beta-Hydroxybutyric Acid: 1.98 mmol/L — ABNORMAL HIGH (ref 0.05–0.27)

## 2023-10-10 MED ORDER — SODIUM CHLORIDE 0.9 % IV BOLUS
20.0000 mL/kg | Freq: Once | INTRAVENOUS | Status: AC
  Administered 2023-10-10: 1134 mL via INTRAVENOUS

## 2023-10-10 MED ORDER — SODIUM CHLORIDE 0.9 % IV SOLN: INTRAVENOUS | Status: DC

## 2023-10-10 MED ORDER — INSULIN REGULAR(HUMAN) IN NACL 100-0.9 UT/100ML-% IV SOLN: INTRAVENOUS | Status: DC

## 2023-10-10 MED ORDER — INSULIN ASPART 100 UNIT/ML IJ SOLN
0.0000 [IU] | Freq: Every day | INTRAMUSCULAR | Status: DC
Start: 2023-10-10 — End: 2023-10-12
  Filled 2023-10-10: qty 0.05

## 2023-10-10 MED ORDER — SODIUM CHLORIDE 0.9 % IV BOLUS
1000.0000 mL | Freq: Once | INTRAVENOUS | Status: AC
  Administered 2023-10-10: 1000 mL via INTRAVENOUS

## 2023-10-10 MED ORDER — DEXTROSE-SODIUM CHLORIDE 5-0.45 % IV SOLN: INTRAVENOUS | Status: DC

## 2023-10-10 MED ORDER — DEXTROSE 50 % IV SOLN: 0.0000 mL | INTRAVENOUS | Status: DC | PRN

## 2023-10-10 MED ORDER — NOVOLOG FLEXPEN 100 UNIT/ML ~~LOC~~ SOPN: PEN_INJECTOR | SUBCUTANEOUS | 2 refills | Status: DC

## 2023-10-10 MED ORDER — POTASSIUM CHLORIDE 10 MEQ/100ML IV SOLN
10.0000 meq | INTRAVENOUS | Status: AC
  Administered 2023-10-10 (×2): 10 meq via INTRAVENOUS
  Filled 2023-10-10 (×2): qty 100

## 2023-10-10 MED ORDER — BD PEN NEEDLE NANO 2ND GEN 32G X 4 MM MISC: 1.0000 | Freq: Four times a day (QID) | 3 refills | Status: DC

## 2023-10-10 MED ORDER — INSULIN ASPART 100 UNIT/ML IJ SOLN
4.0000 [IU] | Freq: Three times a day (TID) | INTRAMUSCULAR | Status: DC
  Filled 2023-10-10: qty 0.04

## 2023-10-10 MED ORDER — INSULIN ASPART 100 UNIT/ML IJ SOLN
0.0000 [IU] | Freq: Three times a day (TID) | INTRAMUSCULAR | Status: DC
  Administered 2023-10-11: 5 [IU] via SUBCUTANEOUS
  Administered 2023-10-12: 8 [IU] via SUBCUTANEOUS
  Filled 2023-10-10: qty 0.15

## 2023-10-10 MED ORDER — INSULIN REGULAR(HUMAN) IN NACL 100-0.9 UT/100ML-% IV SOLN
INTRAVENOUS | Status: DC
  Administered 2023-10-10: 8.5 [IU]/h via INTRAVENOUS
  Filled 2023-10-10: qty 100

## 2023-10-10 MED ORDER — INSULIN GLARGINE 100 UNIT/ML ~~LOC~~ SOLN: SUBCUTANEOUS | 6 refills | Status: DC

## 2023-10-10 MED ORDER — INSULIN GLARGINE-YFGN 100 UNIT/ML ~~LOC~~ SOLN
18.0000 [IU] | SUBCUTANEOUS | Status: DC
  Administered 2023-10-10 – 2023-10-11 (×2): 18 [IU] via SUBCUTANEOUS
  Filled 2023-10-10 (×3): qty 0.18

## 2023-10-10 NOTE — Consult Note (Addendum)
Initial Consultation Note   Patient: Jacob [name] ZOX:[id number] DOB: 1991-05-27 PCP: [name], MD DOA: 10/10/2023 DOS: the patient was seen and examined on 10/10/2023 Primary service: Bethann Berkshire, MD  Referring physician: Dr. Estell Harpin Reason for consult: Hyperglycemia  Assessment/Plan: Assessment and Plan: * Involuntary commitment Management per EDP and psychiatry.  Uncontrolled type 1 diabetes mellitus with hyperglycemia, with long-term current use of insulin (HCC) Pt with DM1 with hyperglycemia today. Initial BGL > 800 in ED; however, no anion gap present. BGL already improved to under 400 on endotool. Pt medically clear as soon as BGL normalized (expect this in the next couple of hours) Anticipate putting in transition orders off of endotool shortly as BGL corrects BMP daily while in ED under IVC awaiting psych clearance       TRH will continue to follow the patient.  HPI: Jacob [name] is a 32 y.o. male with past medical history of BPD1, schizoaffective disorder, and DM1.  Pt brought in to ED under IVC taken out by his mother due to patient refusing meds, aggressive behavior.  In ED pt hyperglycemic with BGL in the 800s initially, nl anion gap however.  Pt started on insulin gtt and IVF, BGL already down to 370.  Med rec shows that pt takes 24u lantus at baseline, he says "I guess" that he takes that when asked how much lantus he takes.  Review of Systems: As mentioned in the history of present illness. All other systems reviewed and are negative. Past Medical History:  Diagnosis Date   Bipolar 1 disorder (HCC)    History of attempted suicide 2019   Unsuccessful suicide attempt in 2019 with rat poison   Schizoaffective disorder (HCC)    Type 1 diabetes mellitus on insulin therapy (HCC) 2019   No past surgical history on file. Social History:  reports that he has quit smoking. His smoking use included cigars and cigarettes. He has never used smokeless tobacco.  He reports current alcohol use. He reports that he does not currently use drugs after having used the following drugs: Marijuana.  No Known Allergies  Family History  Problem Relation Age of Onset   Healthy Mother    Healthy Father     Prior to Admission medications   Medication Sig Start Date End Date Taking? Authorizing Provider  Continuous Glucose Receiver (DEXCOM G7 RECEIVER) DEVI Use with Dexcom G7 sensors to continuously monitor blood sugar. Patient not taking: Reported on 10/02/2023 08/22/23   Katheran James, DO  Continuous Glucose Sensor (DEXCOM G7 SENSOR) MISC Place new sensor every 10 days. Use to continuously monitor blood sugar. Patient not taking: Reported on 10/02/2023 08/22/23   Katheran James, DO  Continuous Glucose Transmitter (DEXCOM G6 TRANSMITTER) MISC USE AS DIRECTED FOR CONTINUOUS GLUCOSE MONITORING. USE TRANSMITTER FOR 90 DAYS. DISCARD AND REPLACE. Patient not taking: Reported on 10/02/2023 08/05/23   [name], MD  glucagon 1 MG injection Inject 1 mg into the skin See admin instructions. Follow package directions for low blood sugar. 06/10/22 09/01/23  Steffanie Rainwater, MD  hydrocortisone cream 1 % Apply to affected area 2 times daily 10/04/23   Marita Kansas, [name]  insulin aspart (NOVOLOG FLEXPEN) 100 UNIT/ML FlexPen Inject 7 units before breakfast, lunch and dinner 10/10/23   Atway, Rayann N, DO  insulin glargine (LANTUS) 100 UNIT/ML injection Inject 24 units into the skin daily. 10/10/23 10/09/24  Atway, Rayann N, DO  Insulin Pen Needle (BD PEN NEEDLE NANO 2ND GEN) 32G X 4 MM MISC Inject 1  each into the skin 4 (four) times daily. 10/10/23   Chauncey Mann, DO    Physical Exam: Vitals:   10/10/23 1742  BP: 127/81  Pulse: (!) 112  Resp: 18  Temp: (!) 97.5 F (36.4 C)  TempSrc: Oral  SpO2: 99%   Constitutional: NAD, calm, comfortable Respiratory: clear to auscultation bilaterally, no wheezing, no crackles. Normal respiratory effort. No accessory muscle  use.  Cardiovascular: Regular rate and rhythm, no murmurs / rubs / gallops. No extremity edema. 2+ pedal pulses. No carotid bruits.  Abdomen: no tenderness, no masses palpated. No hepatosplenomegaly. Bowel sounds positive.  Neurologic: CN 2-12 grossly intact. Sensation intact, DTR normal. Strength 5/5 in all 4.  Psychiatric: Calm, cooperative.  Alert and oriented x 3. Normal mood.   Data Reviewed:     Labs on Admission: I have personally reviewed following labs and imaging studies  CBC: Recent Labs  Lab 10/10/23 1730 10/10/23 1739  WBC 9.1  --   HGB 15.1 16.3  HCT 45.6 48.0  MCV 88.4  --   PLT 375  --    Basic Metabolic Panel: Recent Labs  Lab 10/10/23 1730 10/10/23 1739  NA 129* 129*  K 4.7 4.8  CL 93* 94*  CO2 22  --   GLUCOSE 805* >700*  BUN 20 20  CREATININE 1.02 0.90  CALCIUM 9.6  --    GFR: Estimated Creatinine Clearance: 95.4 mL/min (by C-G formula based on SCr of 0.9 mg/dL). Liver Function Tests: Recent Labs  Lab 10/10/23 1730  AST 19  ALT 29  ALKPHOS 97  BILITOT 1.6*  PROT 8.2*  ALBUMIN 4.7   No results for input(s): "LIPASE", "AMYLASE" in the last 168 hours. No results for input(s): "AMMONIA" in the last 168 hours. Coagulation Profile: No results for input(s): "INR", "PROTIME" in the last 168 hours. Cardiac Enzymes: No results for input(s): "CKTOTAL", "CKMB", "CKMBINDEX", "TROPONINI" in the last 168 hours. BNP (last 3 results) No results for input(s): "PROBNP" in the last 8760 hours. HbA1C: Recent Labs    10/10/23 0948  HGBA1C 11.1*   CBG: Recent Labs  Lab 10/10/23 1729 10/10/23 1907 10/10/23 1931 10/10/23 1941 10/10/23 2008  GLUCAP >600* 558* 466* 431* 370*   Lipid Profile: No results for input(s): "CHOL", "HDL", "LDLCALC", "TRIG", "CHOLHDL", "LDLDIRECT" in the last 72 hours. Thyroid Function Tests: No results for input(s): "TSH", "T4TOTAL", "FREET4", "T3FREE", "THYROIDAB" in the last 72 hours. Anemia Panel: No results for  input(s): "VITAMINB12", "FOLATE", "FERRITIN", "TIBC", "IRON", "RETICCTPCT" in the last 72 hours. Urine analysis:    Component Value Date/Time   COLORURINE YELLOW 06/25/2021 0011   APPEARANCEUR CLEAR 06/25/2021 0011   LABSPEC 1.010 06/25/2021 0011   PHURINE 6.0 06/25/2021 0011   GLUCOSEU NEGATIVE 06/25/2021 0011   HGBUR NEGATIVE 06/25/2021 0011   BILIRUBINUR NEGATIVE 06/25/2021 0011   BILIRUBINUR negative 11/06/2018 1449   KETONESUR NEGATIVE 06/25/2021 0011   PROTEINUR NEGATIVE 06/25/2021 0011   UROBILINOGEN 0.2 11/06/2018 1449   NITRITE NEGATIVE 06/25/2021 0011   LEUKOCYTESUR NEGATIVE 06/25/2021 0011    Radiological Exams on Admission: No results found.  EKG: Independently reviewed.     Family Communication: No family in room Primary team communication: D/w Dr. Estell Harpin Thank you very much for involving [place] in the care of your patient.  Author: Hillary Bow., DO 10/10/2023 8:11 PM  For on call review [email or web address].

## 2023-10-10 NOTE — ED Triage Notes (Signed)
BIB [facility] with IVC paperwork, [facility] on [address]. Pt has history of schizophrenia, Mother IVC'd him due to  refusing meds, jumping out of car transporting him to doctors office. Aggressive behavior. Pt ambulatory with [facility]

## 2023-10-10 NOTE — Assessment & Plan Note (Addendum)
Patient presented with his mother today for follow up of his diabetes. Of note, he does not have Invega or Depakote on his medication list anymore and he does not have them with him today. The patient is displaying features of mania, stating that he owns the executive, legislative, and judicial branches. He also has pressured speech and is tangential, although we did try to focus the visit on his uncontrolled type 1 diabetes. His mother is in the room with him during his encounter and he frequently interrupts her when she tries to speak and yells at her. He denies thoughts of self-harm, SI, or HI. I advised the patient to see his psychiatrist and provided him with behavioral health resources for [facility].

## 2023-10-10 NOTE — ED Provider Notes (Signed)
[place] EMERGENCY DEPARTMENT AT [facility] Provider Note   CSN: [id number] Arrival date & time: 10/10/23  1716     History {Add pertinent medical, surgical, social history, OB history to HPI:1} Chief Complaint  Patient presents with   IVC    Jacob [name] is a 32 y.o. male.  Has a history of diabetes and schizophrenia.  His mother took commitment papers out on him because he has not been taking his schizophrenia medicine and he tried to get out of the car today.  He was angry about the medicine she got  The history is provided by the patient and medical records.  Altered Mental Status Presenting symptoms: behavior changes   Severity:  Moderate Most recent episode:  Today Episode history:  Continuous Timing:  Constant Progression:  Waxing and waning Chronicity:  New Context: not alcohol use   Associated symptoms: no abdominal pain, no hallucinations, no headaches, no rash and no seizures        Home Medications Prior to Admission medications   Medication Sig Start Date End Date Taking? Authorizing Provider  Continuous Glucose Receiver (DEXCOM G7 RECEIVER) DEVI Use with Dexcom G7 sensors to continuously monitor blood sugar. Patient not taking: Reported on 10/02/2023 08/22/23   Katheran James, DO  Continuous Glucose Sensor (DEXCOM G7 SENSOR) MISC Place new sensor every 10 days. Use to continuously monitor blood sugar. Patient not taking: Reported on 10/02/2023 08/22/23   Katheran James, DO  Continuous Glucose Transmitter (DEXCOM G6 TRANSMITTER) MISC USE AS DIRECTED FOR CONTINUOUS GLUCOSE MONITORING. USE TRANSMITTER FOR 90 DAYS. DISCARD AND REPLACE. Patient not taking: Reported on 10/02/2023 08/05/23   [name], MD  glucagon 1 MG injection Inject 1 mg into the skin See admin instructions. Follow package directions for low blood sugar. 06/10/22 09/01/23  Steffanie Rainwater, MD  hydrocortisone cream 1 % Apply to affected area 2 times daily 10/04/23   Marita Kansas, [name]  insulin aspart (NOVOLOG FLEXPEN) 100 UNIT/ML FlexPen Inject 7 units before breakfast, lunch and dinner 10/10/23   Atway, Rayann N, DO  insulin glargine (LANTUS) 100 UNIT/ML injection Inject 24 units into the skin daily. 10/10/23 10/09/24  Atway, Rayann N, DO  Insulin Pen Needle (BD PEN NEEDLE NANO 2ND GEN) 32G X 4 MM MISC Inject 1 each into the skin 4 (four) times daily. 10/10/23   Chauncey Mann, DO      Allergies    Patient has no known allergies.    Review of Systems   Review of Systems  Constitutional:  Negative for appetite change and fatigue.  HENT:  Negative for congestion, ear discharge and sinus pressure.   Eyes:  Negative for discharge.  Respiratory:  Negative for cough.   Cardiovascular:  Negative for chest pain.  Gastrointestinal:  Negative for abdominal pain and diarrhea.  Genitourinary:  Negative for frequency and hematuria.  Musculoskeletal:  Negative for back pain.  Skin:  Negative for rash.  Neurological:  Negative for seizures and headaches.  Psychiatric/Behavioral:  Negative for hallucinations.     Physical Exam Updated Vital Signs BP 101/63   Pulse 95   Temp (!) 97.5 F (36.4 C) (Oral)   Resp 17   SpO2 97%  Physical Exam Vitals and nursing note reviewed.  Constitutional:      Appearance: He is well-developed.  HENT:     Head: Normocephalic.     Nose: Nose normal.  Eyes:     General: No scleral icterus.    Conjunctiva/sclera: Conjunctivae normal.  Neck:     Thyroid: No thyromegaly.  Cardiovascular:     Rate and Rhythm: Normal rate and regular rhythm.     Heart sounds: No murmur heard.    No friction rub. No gallop.  Pulmonary:     Breath sounds: No stridor. No wheezing or rales.  Chest:     Chest wall: No tenderness.  Abdominal:     General: There is no distension.     Tenderness: There is no abdominal tenderness. There is no rebound.  Musculoskeletal:        General: Normal range of motion.     Cervical back: Neck supple.   Lymphadenopathy:     Cervical: No cervical adenopathy.  Skin:    Findings: No erythema or rash.  Neurological:     Mental Status: He is alert and oriented to person, place, and time.     Motor: No abnormal muscle tone.     Coordination: Coordination normal.  Psychiatric:     Comments: Patient not suicidal or homicidal.  Patient having some anger issues.     ED Results / Procedures / Treatments   Labs (all labs ordered are listed, but only abnormal results are displayed) Labs Reviewed  COMPREHENSIVE METABOLIC PANEL - Abnormal; Notable for the following components:      Result Value   Sodium 129 (*)    Chloride 93 (*)    Glucose, Bld 805 (*)    Total Protein 8.2 (*)    Total Bilirubin 1.6 (*)    All other components within normal limits  BETA-HYDROXYBUTYRIC ACID - Abnormal; Notable for the following components:   Beta-Hydroxybutyric Acid 1.98 (*)    All other components within normal limits  OSMOLALITY - Abnormal; Notable for the following components:   Osmolality 333 (*)    All other components within normal limits  CBG MONITORING, ED - Abnormal; Notable for the following components:   Glucose-Capillary >600 (*)    All other components within normal limits  I-STAT CHEM 8, ED - Abnormal; Notable for the following components:   Sodium 129 (*)    Chloride 94 (*)    Glucose, Bld >700 (*)    All other components within normal limits  CBG MONITORING, ED - Abnormal; Notable for the following components:   Glucose-Capillary 558 (*)    All other components within normal limits  CBG MONITORING, ED - Abnormal; Notable for the following components:   Glucose-Capillary 466 (*)    All other components within normal limits  CBG MONITORING, ED - Abnormal; Notable for the following components:   Glucose-Capillary 431 (*)    All other components within normal limits  CBG MONITORING, ED - Abnormal; Notable for the following components:   Glucose-Capillary 370 (*)    All other  components within normal limits  CBG MONITORING, ED - Abnormal; Notable for the following components:   Glucose-Capillary 271 (*)    All other components within normal limits  ETHANOL  CBC  RAPID URINE DRUG SCREEN, HOSP PERFORMED  BLOOD GAS, VENOUS  URINALYSIS, ROUTINE W REFLEX MICROSCOPIC  BASIC METABOLIC PANEL    EKG None  Radiology No results found.  Procedures Procedures  {Document cardiac monitor, telemetry assessment procedure when appropriate:1}  Medications Ordered in ED Medications  dextrose 50 % solution 0-50 mL (has no administration in time range)  0.9 %  sodium chloride infusion (has no administration in time range)  dextrose 5 % and 0.45 % NaCl infusion (has no administration in time range)  insulin regular, human (MYXREDLIN) 100 units/ 100 mL infusion (2.8 Units/hr Intravenous Rate/Dose Change 10/10/23 2118)  sodium chloride 0.9 % bolus 1,000 mL (0 mLs Intravenous Stopped 10/10/23 1851)  sodium chloride 0.9 % bolus 1,134 mL (0 mLs Intravenous Stopped 10/10/23 1939)  potassium chloride 10 mEq in 100 mL IVPB (0 mEq Intravenous Stopped 10/10/23 2117)    ED Course/ Medical Decision Making/ A&P   {  CRITICAL CARE Performed by: Bethann Berkshire Total critical care time: 40 minutes Critical care time was exclusive of separately billable procedures and treating other patients. Critical care was necessary to treat or prevent imminent or life-threatening deterioration. Critical care was time spent personally by me on the following activities: development of treatment plan with patient and/or surrogate as well as nursing, discussions with consultants, evaluation of patient's response to treatment, examination of patient, obtaining history from patient or surrogate, ordering and performing treatments and interventions, ordering and review of laboratory studies, ordering and review of radiographic studies, pulse oximetry and re-evaluation of patient's condition.  Click here for  ABCD2, HEART and other calculatorsREFRESH Note before signing :1}                              Medical Decision Making Amount and/or Complexity of Data Reviewed Labs: ordered.  Risk Prescription drug management.   Patient with poorly controlled diabetes.  He is initially put on insulin drip.  His blood pressure has improved and he has been seen by internal medicine who states they will transition him over to subcu insulin.  Patient will be seen by behavioral health for his erratic behavior and not taking his medicine for schizophrenia  {Document critical care time when appropriate:1} {Document review of labs and clinical decision tools ie heart score, Chads2Vasc2 etc:1}  {Document your independent review of radiology images, and any outside records:1} {Document your discussion with family members, caretakers, and with consultants:1} {Document social determinants of health affecting pt's care:1} {Document your decision making why or why not admission, treatments were needed:1} Final Clinical Impression(s) / ED Diagnoses Final diagnoses:  None    Rx / DC Orders ED Discharge Orders     None

## 2023-10-10 NOTE — Assessment & Plan Note (Signed)
>>  ASSESSMENT AND PLAN FOR UNCONTROLLED TYPE 1 DIABETES MELLITUS WITH HYPOGLYCEMIA WITHOUT COMA (HCC) WRITTEN ON 10/10/2023 10:28 AM BY ATWAY, RAYANN N, DO  Presents for follow up of his T1DM. A1c is 11.1% today. The patient is unhomed and has many barriers to his medical care. He states that sometimes his insulin  gets stolen, but he is not able to tell me how many days his misses his insulin . He is out of his long acting, but states that he last used it yesterday morning. Fasting CBG this morning was 381.   Of note, the patient was previously prescribed a Dexcom, but is not using this and does not want to. He also does not want to speak to anyone else about his diabetes.   Plan: - Refill Lantus  24u daily - Refill Novolog  7u with meals

## 2023-10-10 NOTE — Telephone Encounter (Signed)
Walk-in. Requesting to be seen and needing refill on diabetes med. LOV was 08/22/23 with return 2 weeks f/u.  Discussed w/The Attending, Dr Sol Blazing. Added to Dr Imogene Burn schedule for 0945 Am; doctor and nurse informed.

## 2023-10-10 NOTE — Patient Instructions (Signed)
Thank you, [name] for allowing [place] to provide your care today. Today we discussed:  Diabetes Take Lantus 24 units daily (long acting) Take novolog 7 units with each meal (short acting) Come back in ~ 4 weeks so we can check your blood sugar and see how you are doing Be sure to follow up with your psychiatrist Information on disability given today   I have ordered the following labs for you:   Lab Orders         Glucose, capillary         POC Hbg A1C       Referrals ordered today:   Referral Orders  No referral(s) requested today     I have ordered the following medication/changed the following medications:   Stop the following medications: Medications Discontinued During This Encounter  Medication Reason   insulin aspart (NOVOLOG FLEXPEN) 100 UNIT/ML FlexPen Reorder   BD PEN NEEDLE NANO 2ND GEN 32G X 4 MM MISC Reorder   insulin glargine (LANTUS) 100 UNIT/ML injection Reorder     Start the following medications: Meds ordered this encounter  Medications   insulin glargine (LANTUS) 100 UNIT/ML injection    Sig: Inject 24 units into the skin daily.    Dispense:  10 mL    Refill:  6   insulin aspart (NOVOLOG FLEXPEN) 100 UNIT/ML FlexPen    Sig: Inject 7 units before breakfast, lunch and dinner    Dispense:  15 mL    Refill:  2   Insulin Pen Needle (BD PEN NEEDLE NANO 2ND GEN) 32G X 4 MM MISC    Sig: Inject 1 each into the skin 4 (four) times daily.    Dispense:  400 each    Refill:  3     Follow up:  4 weeks     Should you have any questions or concerns please call the internal medicine clinic at [phone].     Jacob [name], D.O. [facility]   [facility] - preferred! URGENT CARE SERVICES - WALK IN Phone: [phone]  Address: [address] [place], [place] [address]  Hours:Open 24/7, No appointment required.  URGENT CARE SERVICES Assessment:  mental health evaluation, assessing immediate  safety concerns and further mental health needs.  Referral: Resources, Connect to community-based mental health treatment, when indicated, including psychotherapy, psychiatry and other specialized behavioral health or substance use disorder services (for those not already in treatment).  Transitional care: In-person assessment and/or virtual follow up during the patient's transition to connect them with the appropriate outpatient services.  OUTPATIENT SERVICES  Individual Therapy Partial Hospitalization Program (PHP) Substance Abuse Intensive Outpatient Program [facility]) Specialized Intensive Adult Group Therapy Medication Management Peer Living Room

## 2023-10-10 NOTE — Assessment & Plan Note (Signed)
Management per EDP and psychiatry.

## 2023-10-10 NOTE — Assessment & Plan Note (Addendum)
Presents for follow up of his T1DM. A1c is 11.1% today. The patient is unhomed and has many barriers to his medical care. He states that sometimes his insulin gets stolen, but he is not able to tell me how many days his misses his insulin. He is out of his long acting, but states that he last used it yesterday morning. Fasting CBG this morning was 381.   Of note, the patient was previously prescribed a Dexcom, but is not using this and does not want to. He also does not want to speak to anyone else about his diabetes.   Plan: - Refill Lantus 24u daily - Refill Novolog 7u with meals

## 2023-10-10 NOTE — Progress Notes (Signed)
CC: T1DM  HPI:  [name] is a 32 y.o. male living with a history stated below and presents today for a follow up of his type 1 diabetes. Please see problem based assessment and plan for additional details.  Past Medical History:  Diagnosis Date   Bipolar 1 disorder (HCC)    History of attempted suicide 2019   Unsuccessful suicide attempt in 2019 with rat poison   Schizoaffective disorder (HCC)    Type 1 diabetes mellitus on insulin therapy (HCC) 2019    Current Outpatient Medications on File Prior to Visit  Medication Sig Dispense Refill   Continuous Glucose Receiver (DEXCOM G7 RECEIVER) DEVI Use with Dexcom G7 sensors to continuously monitor blood sugar. (Patient not taking: Reported on 10/02/2023) 1 each 0   Continuous Glucose Sensor (DEXCOM G7 SENSOR) MISC Place new sensor every 10 days. Use to continuously monitor blood sugar. (Patient not taking: Reported on 10/02/2023) 3 each 11   Continuous Glucose Transmitter (DEXCOM G6 TRANSMITTER) MISC USE AS DIRECTED FOR CONTINUOUS GLUCOSE MONITORING. USE TRANSMITTER FOR 90 DAYS. DISCARD AND REPLACE. (Patient not taking: Reported on 10/02/2023) 1 each 3   glucagon 1 MG injection Inject 1 mg into the skin See admin instructions. Follow package directions for low blood sugar. 10 each 1   hydrocortisone cream 1 % Apply to affected area 2 times daily 15 g 0   No current facility-administered medications on file prior to visit.    Family History  Problem Relation Age of Onset   Healthy Mother    Healthy Father     Social History   Socioeconomic History   Marital status: Single    Spouse name: Not on file   Number of children: Not on file   Years of education: Not on file   Highest education level: Not on file  Occupational History   Not on file  Tobacco Use   Smoking status: Former    Types: Cigars, Cigarettes   Smokeless tobacco: Never   Tobacco comments:    2 BLACK AND MILD A DAY  Vaping Use   Vaping status: Every Day   Substance and Sexual Activity   Alcohol use: Yes    Comment: social/occassional   Drug use: Not Currently    Types: Marijuana   Sexual activity: Not on file  Other Topics Concern   Not on file  Social History Narrative   Not on file   Social Determinants of Health   Financial Resource Strain: Not on file  Food Insecurity: Not on file  Transportation Needs: Not on file  Physical Activity: Not on file  Stress: Not on file  Social Connections: Unknown (03/02/2022)   Received from [facility], [facility]   Social Network    Social Network: Not on file  Intimate Partner Violence: Unknown (02/05/2022)   Received from [facility], [facility]   HITS    Physically Hurt: Not on file    Insult or Talk Down To: Not on file    Threaten Physical Harm: Not on file    Scream or Curse: Not on file    Review of Systems: ROS negative except for what is noted on the assessment and plan.  There were no vitals filed for this visit.  Physical Exam: Constitutional: thin male standing, pacing around room Pulmonary/Chest: normal work of breathing on room air MSK: normal bulk and tone Neurological: alert & oriented x 3, no focal deficit Skin: warm and dry Psych: manic, pressured speech  Assessment & Plan:    Patient discussed with Dr. Antony Contras  Uncontrolled type 1 diabetes mellitus with hypoglycemia without coma (HCC) Presents for follow up of his T1DM. A1c is 11.1% today. The patient is unhomed and has many barriers to his medical care. He states that sometimes his insulin gets stolen, but he is not able to tell me how many days his misses his insulin. He is out of his long acting, but states that he last used it yesterday morning. Fasting CBG this morning was 381.   Of note, the patient was previously prescribed a Dexcom, but is not using this and does not want to. He also does not want to speak to anyone else about his diabetes.   Plan: - Refill Lantus 24u daily - Refill  Novolog 7u with meals  Schizoaffective disorder [facility]) Patient presented with his mother today for follow up of his diabetes. Of note, he does not have Invega or Depakote on his medication list anymore and he does not have them with him today. The patient is displaying features of mania, stating that he owns the executive, legislative, and judicial branches. He also has pressured speech and is tangential, although we did try to focus the visit on his uncontrolled type 1 diabetes. His mother is in the room with him during his encounter and he frequently interrupts her when she tries to speak and yells at her. He denies thoughts of self-harm, SI, or HI. I advised the patient to see his psychiatrist and provided him with behavioral health resources for [facility].    Elza Rafter, D.O. [facility] Internal Medicine, PGY-3 Phone: [phone] Date 10/10/2023 Time 10:32 AM

## 2023-10-10 NOTE — Assessment & Plan Note (Addendum)
Pt with DM1 with hyperglycemia today. Initial BGL > 800 in ED; however, no anion gap present, normal pH on VBG BGL already improved to under 400 on endotool. Pt medically clear as soon as BGL normalized (expect this in the next couple of hours) Anticipate putting in transition orders off of endotool shortly as BGL corrects BMP daily while in ED under IVC awaiting psych clearance

## 2023-10-11 DIAGNOSIS — F250 Schizoaffective disorder, bipolar type: Secondary | ICD-10-CM

## 2023-10-11 LAB — CBG MONITORING, ED
Glucose-Capillary: 201 mg/dL — ABNORMAL HIGH (ref 70–99)
Glucose-Capillary: 105 mg/dL — ABNORMAL HIGH (ref 70–99)
Glucose-Capillary: 59 mg/dL — ABNORMAL LOW (ref 70–99)
Glucose-Capillary: 62 mg/dL — ABNORMAL LOW (ref 70–99)
Glucose-Capillary: 73 mg/dL (ref 70–99)
Glucose-Capillary: 133 mg/dL — ABNORMAL HIGH (ref 70–99)
Glucose-Capillary: 209 mg/dL — ABNORMAL HIGH (ref 70–99)
Glucose-Capillary: 265 mg/dL — ABNORMAL HIGH (ref 70–99)

## 2023-10-11 MED ORDER — HALOPERIDOL 5 MG PO TABS: 5.0000 mg | ORAL_TABLET | Freq: Three times a day (TID) | ORAL | Status: DC | PRN

## 2023-10-11 MED ORDER — LORAZEPAM 1 MG PO TABS
2.0000 mg | ORAL_TABLET | Freq: Once | ORAL | Status: AC
  Administered 2023-10-11: 2 mg via ORAL
  Filled 2023-10-11: qty 2

## 2023-10-11 MED ORDER — DIPHENHYDRAMINE HCL 50 MG/ML IJ SOLN: 25.0000 mg | Freq: Three times a day (TID) | INTRAMUSCULAR | Status: DC | PRN

## 2023-10-11 MED ORDER — PALIPERIDONE ER 3 MG PO TB24
3.0000 mg | ORAL_TABLET | Freq: Every day | ORAL | Status: DC
  Administered 2023-10-11 – 2023-10-12 (×2): 3 mg via ORAL
  Filled 2023-10-11 (×2): qty 1

## 2023-10-11 MED ORDER — HALOPERIDOL LACTATE 5 MG/ML IJ SOLN
5.0000 mg | Freq: Three times a day (TID) | INTRAMUSCULAR | Status: DC | PRN
Start: 2023-10-11 — End: 2023-10-12

## 2023-10-11 MED ORDER — DIPHENHYDRAMINE HCL 25 MG PO CAPS
25.0000 mg | ORAL_CAPSULE | Freq: Three times a day (TID) | ORAL | Status: DC | PRN
Start: 2023-10-11 — End: 2023-10-12

## 2023-10-11 NOTE — ED Provider Notes (Signed)
Emergency Medicine Observation Re-evaluation Note  Jacob [name] is a 32 y.o. male, seen on rounds today.  Pt initially presented to the ED for complaints of IVC Currently, the patient is cooperative and pacing in the halls .  Physical Exam  BP 118/81   Pulse 81   Temp 98 F (36.7 C)   Resp 16   SpO2 100%  Physical Exam General: Awake. Alert. No acute distress Cardiac: Regular rate rhythm Lungs: Clear to auscultation bilaterally Psych: Calm and cooperative ED Course / MDM  EKG:   I have reviewed the labs performed to date as well as medications administered while in observation.  Recent changes in the last 24 hours include attempted TTS evaluation remotely but unable to connect.  Awaiting in person TTS evaluation this morning  Plan  Current plan is for TTS evaluation and disposition.    Royanne Foots, DO 10/11/23 [phone]

## 2023-10-11 NOTE — ED Notes (Signed)
Pt refused to eat sand which given to him. CBG rechecked, still critically low. Paramedic made aware.

## 2023-10-11 NOTE — ED Notes (Signed)
The patient's mother came and asked if he could take his belongings and wash them. The patient agreed to same. Belongings were given to mother by security personnel

## 2023-10-11 NOTE — Consult Note (Signed)
Met with patient at this time.  He is refusing to eat and his last BS before Lunch is 73.  Patient advised and encouraged to eat and drink some juice.  Patient just looked at provider and covered himself up.  Two food trays are in his room but he is not eating.

## 2023-10-11 NOTE — ED Notes (Signed)
Pt received breakfast 

## 2023-10-11 NOTE — ED Notes (Addendum)
Pt did not eat breakfast. Pt stated someone told him he couldn't eat for 24 hours. CBG was taken and it was critically low, pt given orange juice and sand which box and told to eat. Paramedic made aware.

## 2023-10-11 NOTE — ED Notes (Signed)
Pt dinner tray delivered

## 2023-10-11 NOTE — ED Notes (Signed)
Pt did not eat sand which given to him, this writer told pt he needed to drink orange juice in front of her. Pt proceeded to drink orange juice. Pt has now drank 2 cups of orange juice.

## 2023-10-11 NOTE — Consult Note (Signed)
Provider called [phone]) to verify Medications.  Patient received last Invega Trinza 546 mg injection last April this year.  He was taking Paliperidone 6 mg po daily till November 13 th this year.

## 2023-10-11 NOTE — Progress Notes (Signed)
Hospitalist were consulted to manage type 1 diabetes on this gentleman who is here with involuntary commitment by his mom for refusing medications and noncompliance.  I went to see patient in the ED.  Patient was constantly pacing in the hallway and would not stop for me to talk to him.  Reviewed his chart.Initially patient's blood sugar was in 800 range, he was started on insulin drip which was subsequently stopped since his blood sugar improved.  He was resumed on his home dose of Lantus, Premeal regimen as well as SSI, blood sugar is currently controlled.  Patient is waiting for a bed at psychiatry unit.  This is no charge note.

## 2023-10-11 NOTE — ED Notes (Signed)
Pt was cooperative with CBG, pt seen pacing back and forth in unit.

## 2023-10-11 NOTE — ED Notes (Signed)
RN informed hospitalist to DC Insulin drip. Hospitalist agreed. No new orders.

## 2023-10-11 NOTE — Consult Note (Signed)
BH ED ASSESSMENT   Reason for Consult:  Psychiatry evaluation Referring Physician:  ER Physician Patient Identification: Jacob [name] MRN:  [id number] ED Chief Complaint: Schizoaffective disorder, manic type (HCC)  Diagnosis:  Principal Problem:   Schizoaffective disorder, manic type (HCC) Active Problems:   Involuntary commitment   Uncontrolled type 1 diabetes mellitus with hyperglycemia, with long-term current use of insulin [facility])   ED Assessment Time Calculation: Start Time: 1025 Stop Time: 1052 Total Time in Minutes (Assessment Completion): 27   Subjective:   Jacob [name] is a 32 y.o. male patient admitted with Previous Psychiatry hx of Schizoaffective disorder, Bipolar type, Cannabis abuse,Depression brought in by [employer] under IVC taken out by his mother for non compliant with Medications and he jumped off a moving car driven by his mother on his way for his appointment,  HPI:  Previous chart reviewed, patient was seen in the hallway walking non stop and he agreed to go back to his room to be seen by provider.  Patient reports that he was brought by the Police called by his mother.  He said his mother gave him two boxes of Syringe but no Insulin.  He reports that he only takes insulin.  He refused to answer questions regarding Mental illness or Medications.  Patient states he has no reason to talk about any illness than Diabetes.  He declined to answer question about his outpatient Psychiatrist or facility he goes to get Mental health care. Review of chart reveals that patient was taking LAI Invega last year but nobody knows when he took this injection last.  Patient is emaciated, disheveled and unkempt.  Patient continues to pace the hallway, stops briefly to speak with staff and continues pacing.   Mother is come in and offered collateral information stating that patient is walking the streets.  He has not had his invega injection in a year.  Mother reports that he has been erratic,  aggressive and easily agitated that she is afraid of what he will do.  He is supposed to stay at [facility] but he refuses to stay there.  He goes to eat and walks off to the street.  Patient did not want to speak with mom and she left. Past Psychiatric History: Depression and Schizoaffective. Previous inpatient psychiatry hospitalization at [facility]. On 3 monthly LAI Paliperidone, for a year he he has not had  this injection.  Hx of adding rat poison to Ice cream and ate it to kill his conscience 3-4 years ago.   Risk to Self or Others: Is the patient at risk to self? Yes Has the patient been a risk to self in the past 6 months? Yes Has the patient been a risk to self within the distant past? Yes Is the patient a risk to others? No Has the patient been a risk to others in the past 6 months? No Has the patient been a risk to others within the distant past? No  [place] Scale:  Flowsheet Row ED from 10/10/2023 in [facility] Emergency Department at [facility] Most recent reading at 10/10/2023  5:25 PM ED from 10/03/2023 in [facility] Emergency Department at [facility] Most recent reading at 10/03/2023  9:13 PM ED from 10/03/2023 in [facility] Emergency Department at [facility] Most recent reading at 10/03/2023  3:56 AM  C-SSRS RISK CATEGORY No Risk No Risk No Risk       AIMS:  , , ,  ,   ASAM:  Substance Abuse:     Past Medical History:  Past Medical History:  Diagnosis Date   Bipolar 1 disorder (HCC)    History of attempted suicide 2019   Unsuccessful suicide attempt in 2019 with rat poison   Schizoaffective disorder (HCC)    Type 1 diabetes mellitus on insulin therapy (HCC) 2019   No past surgical history on file. Family History:  Family History  Problem Relation Age of Onset   Healthy Mother    Healthy Father    Family Psychiatric  History: Unknown Social History:  Social History   Substance and Sexual Activity  Alcohol Use Yes   Comment:  social/occassional     Social History   Substance and Sexual Activity  Drug Use Not Currently   Types: Marijuana    Social History   Socioeconomic History   Marital status: Single    Spouse name: Not on file   Number of children: Not on file   Years of education: Not on file   Highest education level: Not on file  Occupational History   Not on file  Tobacco Use   Smoking status: Former    Types: Cigars, Cigarettes   Smokeless tobacco: Never   Tobacco comments:    2 BLACK AND MILD A DAY  Vaping Use   Vaping status: Every Day  Substance and Sexual Activity   Alcohol use: Yes    Comment: social/occassional   Drug use: Not Currently    Types: Marijuana   Sexual activity: Not on file  Other Topics Concern   Not on file  Social History Narrative   Not on file   Social Determinants of Health   Financial Resource Strain: Not on file  Food Insecurity: Not on file  Transportation Needs: Not on file  Physical Activity: Not on file  Stress: Not on file  Social Connections: Unknown (03/02/2022)   [facility] from [facility]   Social Network    Social Network: Not on file   Additional Social History:    Allergies:  No Known Allergies  Labs:  Results for orders placed or performed during the hospital encounter of 10/10/23 (from the past 48 hour(s))  CBG monitoring, ED     Status: Abnormal   Collection Time: 10/10/23  5:29 PM  Result Value Ref Range   Glucose-Capillary >600 (HH) 70 - 99 mg/dL    Comment: Glucose reference range applies only to samples taken after fasting for at least 8 hours.  Comprehensive metabolic panel     Status: Abnormal   Collection Time: 10/10/23  5:30 PM  Result Value Ref Range   Sodium 129 (L) 135 - 145 mmol/L   Potassium 4.7 3.5 - 5.1 mmol/L   Chloride 93 (L) 98 - 111 mmol/L   CO2 22 22 - 32 mmol/L   Glucose, Bld 805 (HH) 70 - 99 mg/dL    Comment: CRITICAL RESULT CALLED TO, READ BACK BY AND VERIFIED WITH P.DOWD, RN AT 7564  ON 12.09.24 BY N.THOMPSON Glucose reference range applies only to samples taken after fasting for at least 8 hours.    BUN 20 6 - 20 mg/dL   Creatinine, Ser 3.32 0.61 - 1.24 mg/dL   Calcium 9.6 8.9 - 95.1 mg/dL   Total Protein 8.2 (H) 6.5 - 8.1 g/dL   Albumin 4.7 3.5 - 5.0 g/dL   AST 19 15 - 41 U/L   ALT 29 0 - 44 U/L   Alkaline Phosphatase 97 38 - 126 U/L  Total Bilirubin 1.6 (H) <1.2 mg/dL   GFR, Estimated >13 >08 mL/min    Comment: (NOTE) Calculated using the CKD-EPI Creatinine Equation (2021)    Anion gap 14 5 - 15    Comment: Performed at [facility], [address], [place], [place] [address]  Ethanol     Status: None   Collection Time: 10/10/23  5:30 PM  Result Value Ref Range   Alcohol, Ethyl (B) <10 <10 mg/dL    Comment: (NOTE) Lowest detectable limit for serum alcohol is 10 mg/dL.  For medical purposes only. Performed at [facility], [address] [place], [place] [address]   cbc     Status: None   Collection Time: 10/10/23  5:30 PM  Result Value Ref Range   WBC 9.1 4.0 - 10.5 K/uL   RBC 5.16 4.22 - 5.81 MIL/uL   Hemoglobin 15.1 13.0 - 17.0 g/dL   HCT 52.8 41.3 - 24.4 %   MCV 88.4 80.0 - 100.0 fL   MCH 29.3 26.0 - 34.0 pg   MCHC 33.1 30.0 - 36.0 g/dL   RDW 01.0 27.2 - 53.6 %   Platelets 375 150 - 400 K/uL   nRBC 0.0 0.0 - 0.2 %    Comment: Performed at [facility], [address] [place], [place] [address]  Beta-hydroxybutyric acid     Status: Abnormal   Collection Time: 10/10/23  5:30 PM  Result Value Ref Range   Beta-Hydroxybutyric Acid 1.98 (H) 0.05 - 0.27 mmol/L    Comment: Performed at [facility], [address] [place], [place] [address]  Osmolality     Status: Abnormal   Collection Time: 10/10/23  5:30 PM  Result Value Ref Range   Osmolality 333 (HH) 275 - 295 mOsm/kg    Comment: REPEATED TO VERIFY CRITICAL RESULT CALLED TO, READ BACK BY AND VERIFIED  WITH: Bartholome Bill RN 10/10/2023 AT 2128 SKEEN,P Performed at [facility], [address] [place], [place] [address]   Rapid urine drug screen (hospital performed)     Status: None   Collection Time: 10/10/23  5:39 PM  Result Value Ref Range   Opiates NONE DETECTED NONE DETECTED   Cocaine NONE DETECTED NONE DETECTED   Benzodiazepines NONE DETECTED NONE DETECTED   Amphetamines NONE DETECTED NONE DETECTED   Tetrahydrocannabinol NONE DETECTED NONE DETECTED   Barbiturates NONE DETECTED NONE DETECTED    Comment: (NOTE) DRUG SCREEN FOR MEDICAL PURPOSES ONLY.  IF CONFIRMATION IS NEEDED FOR ANY PURPOSE, NOTIFY LAB WITHIN 5 DAYS.  LOWEST DETECTABLE LIMITS FOR URINE DRUG SCREEN Drug Class                     Cutoff (ng/mL) Amphetamine and metabolites    1000 Barbiturate and metabolites    200 Benzodiazepine                 200 Opiates and metabolites        300 Cocaine and metabolites        300 THC                            50 Performed at [facility], [address], [place], [place] [address]   I-stat chem 8, ED (not at [facility] or [facility])     Status: Abnormal   Collection Time: 10/10/23  5:39 PM  Result Value Ref Range  Sodium 129 (L) 135 - 145 mmol/L   Potassium 4.8 3.5 - 5.1 mmol/L   Chloride 94 (L) 98 - 111 mmol/L   BUN 20 6 - 20 mg/dL   Creatinine, Ser 8.65 0.61 - 1.24 mg/dL   Glucose, Bld >784 (HH) 70 - 99 mg/dL    Comment: Glucose reference range applies only to samples taken after fasting for at least 8 hours.   Calcium, Ion 1.17 1.15 - 1.40 mmol/L   TCO2 24 22 - 32 mmol/L   Hemoglobin 16.3 13.0 - 17.0 g/dL   HCT 69.6 29.5 - 28.4 %   Comment NOTIFIED PHYSICIAN   Blood gas, venous     Status: None   Collection Time: 10/10/23  6:30 PM  Result Value Ref Range   pH, Ven 7.3 7.25 - 7.43   pCO2, Ven 51 44 - 60 mmHg   pO2, Ven 45 32 - 45 mmHg   Bicarbonate 25.1 20.0 - 28.0 mmol/L   Acid-base deficit 2.0 0.0 - 2.0 mmol/L   O2 Saturation 74.7 %    Patient temperature 37.0     Comment: Performed at [facility], [address], [place], [place] [address]  CBG monitoring, ED     Status: Abnormal   Collection Time: 10/10/23  7:07 PM  Result Value Ref Range   Glucose-Capillary 558 (HH) 70 - 99 mg/dL    Comment: Glucose reference range applies only to samples taken after fasting for at least 8 hours.   Comment 1 Notify RN   CBG monitoring, ED     Status: Abnormal   Collection Time: 10/10/23  7:31 PM  Result Value Ref Range   Glucose-Capillary 466 (H) 70 - 99 mg/dL    Comment: Glucose reference range applies only to samples taken after fasting for at least 8 hours.  CBG monitoring, ED     Status: Abnormal   Collection Time: 10/10/23  7:41 PM  Result Value Ref Range   Glucose-Capillary 431 (H) 70 - 99 mg/dL    Comment: Glucose reference range applies only to samples taken after fasting for at least 8 hours.  CBG monitoring, ED     Status: Abnormal   Collection Time: 10/10/23  8:08 PM  Result Value Ref Range   Glucose-Capillary 370 (H) 70 - 99 mg/dL    Comment: Glucose reference range applies only to samples taken after fasting for at least 8 hours.  CBG monitoring, ED     Status: Abnormal   Collection Time: 10/10/23  9:16 PM  Result Value Ref Range   Glucose-Capillary 271 (H) 70 - 99 mg/dL    Comment: Glucose reference range applies only to samples taken after fasting for at least 8 hours.  Urinalysis, Routine w reflex microscopic -Urine, Clean Catch     Status: Abnormal   Collection Time: 10/10/23 10:19 PM  Result Value Ref Range   Color, Urine COLORLESS (A) YELLOW   APPearance CLEAR CLEAR   Specific Gravity, Urine 1.029 1.005 - 1.030   pH 5.0 5.0 - 8.0   Glucose, UA >=500 (A) NEGATIVE mg/dL   Hgb urine dipstick NEGATIVE NEGATIVE   Bilirubin Urine NEGATIVE NEGATIVE   Ketones, ur 20 (A) NEGATIVE mg/dL   Protein, ur NEGATIVE NEGATIVE mg/dL   Nitrite NEGATIVE NEGATIVE   Leukocytes,Ua NEGATIVE NEGATIVE    RBC / HPF 0-5 0 - 5 RBC/hpf   WBC, UA 0-5 0 - 5 WBC/hpf   Bacteria, UA NONE SEEN NONE SEEN   Squamous Epithelial /  HPF 0-5 0 - 5 /HPF    Comment: Performed at [facility], [address] [name], [place] [address]  CBG monitoring, ED     Status: Abnormal   Collection Time: 10/10/23 10:22 PM  Result Value Ref Range   Glucose-Capillary 197 (H) 70 - 99 mg/dL    Comment: Glucose reference range applies only to samples taken after fasting for at least 8 hours.  CBG monitoring, ED     Status: Abnormal   Collection Time: 10/10/23 11:34 PM  Result Value Ref Range   Glucose-Capillary 162 (H) 70 - 99 mg/dL    Comment: Glucose reference range applies only to samples taken after fasting for at least 8 hours.  CBG monitoring, ED     Status: Abnormal   Collection Time: 10/11/23  2:43 AM  Result Value Ref Range   Glucose-Capillary 201 (H) 70 - 99 mg/dL    Comment: Glucose reference range applies only to samples taken after fasting for at least 8 hours.  CBG monitoring, ED     Status: Abnormal   Collection Time: 10/11/23  8:53 AM  Result Value Ref Range   Glucose-Capillary 105 (H) 70 - 99 mg/dL    Comment: Glucose reference range applies only to samples taken after fasting for at least 8 hours.    Current Facility-Administered Medications  Medication Dose Route Frequency Provider Last Rate Last Admin   dextrose 50 % solution 0-50 mL  0-50 mL Intravenous PRN Bethann Berkshire, MD       diphenhydrAMINE (BENADRYL) capsule 25 mg  25 mg Oral Q8H PRN Dahlia Byes C, NP       Or   diphenhydrAMINE (BENADRYL) injection 25 mg  25 mg Intramuscular Q8H PRN Anmol Fleck C, NP       haloperidol (HALDOL) tablet 5 mg  5 mg Oral Q8H PRN Dahlia Byes C, NP       Or   haloperidol lactate (HALDOL) injection 5 mg  5 mg Intramuscular Q8H PRN Dahlia Byes C, NP       insulin aspart (novoLOG) injection 0-15 Units  0-15 Units Subcutaneous TID WC Lyda Perone M, DO       insulin  aspart (novoLOG) injection 0-5 Units  0-5 Units Subcutaneous QHS Julian Reil, Jared M, DO       insulin aspart (novoLOG) injection 4 Units  4 Units Subcutaneous TID WC Hillary Bow, DO       insulin glargine-yfgn [facility]) injection 18 Units  18 Units Subcutaneous Q24H Lyda Perone M, DO   18 Units at 10/10/23 2255   insulin regular, human (MYXREDLIN) 100 units/ 100 mL infusion   Intravenous Continuous Hillary Bow, DO   Stopped at 10/11/23 0152   LORazepam (ATIVAN) tablet 2 mg  2 mg Oral Once Dahlia Byes C, NP       paliperidone (INVEGA) 24 hr tablet 3 mg  3 mg Oral Daily Dahlia Byes C, NP       Current Outpatient Medications  Medication Sig Dispense Refill   Continuous Glucose Receiver (DEXCOM G7 RECEIVER) DEVI Use with Dexcom G7 sensors to continuously monitor blood sugar. (Patient not taking: Reported on 10/02/2023) 1 each 0   Continuous Glucose Sensor (DEXCOM G7 SENSOR) MISC Place new sensor every 10 days. Use to continuously monitor blood sugar. (Patient not taking: Reported on 10/02/2023) 3 each 11   Continuous Glucose Transmitter (DEXCOM G6 TRANSMITTER) MISC USE AS DIRECTED FOR CONTINUOUS GLUCOSE MONITORING. USE TRANSMITTER FOR 90 DAYS. DISCARD AND  REPLACE. (Patient not taking: Reported on 10/02/2023) 1 each 3   glucagon 1 MG injection Inject 1 mg into the skin See admin instructions. Follow package directions for low blood sugar. 10 each 1   hydrocortisone cream 1 % Apply to affected area 2 times daily 15 g 0   insulin aspart (NOVOLOG FLEXPEN) 100 UNIT/ML FlexPen Inject 7 units before breakfast, lunch and dinner 15 mL 2   insulin glargine (LANTUS) 100 UNIT/ML injection Inject 24 units into the skin daily. 10 mL 6   Insulin Pen Needle (BD PEN NEEDLE NANO 2ND GEN) 32G X 4 MM MISC Inject 1 each into the skin 4 (four) times daily. 400 each 3    Musculoskeletal: Strength & Muscle Tone: within normal limits Gait & Station: normal Patient leans: Front   Psychiatric Specialty  Exam: Presentation  General Appearance:  Casual; Other (comment) (Disheveled, Unkempt)  Eye Contact: Fleeting  Speech: Clear and Coherent  Speech Volume: Normal  Handedness: Right   Mood and Affect  Mood: Angry; Anxious; Irritable  Affect: Congruent   Thought Process  Thought Processes: Coherent  Descriptions of Associations:Tangential  Orientation:Full (Time, Place and Person)  Thought Content:Illogical  History of Schizophrenia/Schizoaffective disorder:No data recorded Duration of Psychotic Symptoms:No data recorded Hallucinations:Hallucinations: None  Ideas of Reference:None  Suicidal Thoughts:Suicidal Thoughts: No  Homicidal Thoughts:Homicidal Thoughts: No   Sensorium  Memory: Immediate Poor; Recent Poor; Remote Poor  Judgment: Impaired  Insight: Lacking   Executive Functions  Concentration: Poor  Attention Span: Poor  Recall: Fair  Fund of Knowledge: Poor  Language: Good   Psychomotor Activity  Psychomotor Activity: Psychomotor Activity: Increased   Assets  Assets: Communication Skills; Social Support    Sleep  Sleep: Sleep: Fair   Physical Exam: Physical Exam Vitals and nursing note reviewed.  HENT:     Nose: Nose normal.  Cardiovascular:     Rate and Rhythm: Normal rate and regular rhythm.  Pulmonary:     Effort: Pulmonary effort is normal.  Musculoskeletal:        General: Normal range of motion.  Skin:    General: Skin is dry.  Neurological:     Mental Status: He is alert and oriented to person, place, and time.  Psychiatric:        Attention and Perception: He is inattentive.        Mood and Affect: Mood is depressed. Affect is labile and angry.        Speech: Speech is rapid and pressured.        Behavior: Behavior is uncooperative.        Thought Content: Thought content normal.        Judgment: Judgment is impulsive and inappropriate.    Review of Systems  Constitutional:  Positive for  weight loss.  HENT: Negative.    Eyes: Negative.   Respiratory: Negative.    Cardiovascular: Negative.   Gastrointestinal: Negative.   Genitourinary: Negative.   Musculoskeletal: Negative.   Skin: Negative.   Neurological: Negative.   Endo/Heme/Allergies: Negative.    Blood pressure 118/81, pulse 81, temperature 98 F (36.7 C), resp. rate 16, SpO2 100%. There is no height or weight on file to calculate BMI.  Medical Decision Making: Patient is a danger to self and others.  He is pacing, not making eye contact with staff.,  According to mom and grandmother he has not had his injection Invega for a year.  He has been homeless for a year despite been accepted at [facility].  We will admit to Psychiatry unit for treatment and stabilization.  We will start Invega oral 3 mg 24 hrs with plan to increase and then offer the injection.  Meanwhile we will fax out records to facilities with available bed.  Disposition:  Admit, seek bed placement.  Earney Navy, NP-PMHNP-BC 10/11/2023 11:11 AM

## 2023-10-11 NOTE — BH Assessment (Signed)
TTS attempted to see pt but [facility] ED monitor was unable to connect call. Junior, RN notified TTS that [facility] ED cart 1 and cart 2 are not working. Pt will need to be seen by day shift provider.

## 2023-10-11 NOTE — ED Notes (Signed)
Pt ate sand which, CBG has become stable. Paramedic aware.

## 2023-10-12 LAB — CBG MONITORING, ED: Glucose-Capillary: 252 mg/dL — ABNORMAL HIGH (ref 70–99)

## 2023-10-12 NOTE — ED Notes (Signed)
Mr. Kipps has been accepted by [facility]  per Unk Pinto. , to Drue Dun accepting MD is Dr. Darra Lis report can be called to [phone] and pt can arrive after 0900.

## 2023-10-12 NOTE — ED Provider Notes (Signed)
Accepted to [facility]; Pending transfer today.    Coral Spikes, DO 10/12/23 [phone]

## 2023-10-12 NOTE — ED Notes (Signed)
Attempted to call report to [facility] no answer.

## 2023-10-12 NOTE — ED Notes (Signed)
Patient has eaten 100% of his breakfast

## 2023-10-12 NOTE — ED Notes (Signed)
Pt. Was given breakfast tray

## 2023-10-12 NOTE — ED Notes (Signed)
Third unsuccessful attempt to call report to [facility].

## 2023-10-12 NOTE — Progress Notes (Signed)
LCSW Progress Note  [id number]   Jacob [name]  10/12/2023  12:33 AM    Inpatient Behavioral Health Placement  Pt meets inpatient criteria per Earney Navy, NP-PMHNP-BC. There are no available beds within [facility] system per [facility] Kim Brooks,RN. Referral was sent to the following facilities;   Destination  Service Provider Address Phone [facility]  [address] [place], [place] [address] [phone]  [facility]  [address] [place] [address] [phone]  [facility]  [address] [place] [address] [phone]  [facility] [address]  [address] [place] [address] [phone]  [facility]  [address] [place], [place] [address] [phone]  [facility]  [address], [place] [address] [phone]  [facility]  [address] [place], [place] [address] [phone]  [facility]  [address] [place] [address] [phone]  [facility]  [address] [place] [address] [phone]  [facility]  [address] [place] [address] [phone]  [facility]  [address] [place] [address] [phone]  [facility]  [address] [place] [address] [phone]  [facility]  [address], [place] [address] [phone]  [facility]  [place] [phone]  [facility]  [address] [place] [address] [phone]  [facility] EFAX  [address] [place] [phone]  [facility]  [address] [place] [address] [phone]  [facility]  [address] [place] [address] [phone]  [facility]  [address], [place] [address] [phone]  [facility]  [address], [place] [address] [phone]  [facility]  [address] [place] [address] [phone]  [facility]  [address], [place] [address] [phone]  [facility] Inpatient [facility]  [place] [phone]  [facility]  [address] [place] [address] [phone]  [facility] Patient Placement  [facility], [place] [phone]  [facility]  [address], [place] [address] [phone]    Situation ongoing,  CSW will follow up.    Maryjean Ka, MSW, [facility] 10/12/2023 12:33 AM

## 2023-10-12 NOTE — Progress Notes (Signed)
Chart reviewed.  Blood sugar labile, hypoglycemic yesterday and hypoglycemic this morning.  Likely due to inconsistent diet intake.  Unfortunately, will be hard to manage this and at the moment recommend continuing current Lantus with SSI.  Noticed that the plan is for the patient to be transferred to psych unit today.  Hospitalist signing off. No charge note.

## 2023-10-12 NOTE — ED Notes (Signed)
Message left with sheriff transportation  answering service

## 2023-10-12 NOTE — ED Notes (Signed)
Patient is calm and cooperative. He is pacing around the unit.

## 2023-10-12 NOTE — Progress Notes (Signed)
Pt has been accepted to [facility] on 10/12/2023  Bed assignment: Deatra Canter A   Pt meets inpatient criteria per: Dahlia Byes NP  Attending Physician will be: Dr. Darra Lis MD   Report can be called to: [phone]  Pt can arrive after 9 AM   Care Team Notified: [facility] LCSWA    [place] Carlosdaniel Grob LCSW-A   10/12/2023 9:23 AM

## 2023-10-14 NOTE — Progress Notes (Signed)
Internal Medicine Clinic Attending  Case discussed with the resident at the time of the visit.  We reviewed the resident's history and exam and pertinent patient test results.  I agree with the assessment, diagnosis, and plan of care documented in the resident's note.  

## 2023-10-23 ENCOUNTER — Encounter (HOSPITAL_COMMUNITY): Payer: Self-pay

## 2023-10-23 ENCOUNTER — Other Ambulatory Visit: Payer: Self-pay

## 2023-10-23 ENCOUNTER — Emergency Department (HOSPITAL_COMMUNITY): Payer: MEDICAID

## 2023-10-23 ENCOUNTER — Observation Stay (HOSPITAL_COMMUNITY)
Admission: EM | Admit: 2023-10-23 | Discharge: 2023-10-24 | Disposition: A | Discharge status: Home / Self Care | Payer: MEDICAID | Attending: Infectious Diseases | Admitting: Infectious Diseases

## 2023-10-23 DIAGNOSIS — F258 Other schizoaffective disorders: Secondary | ICD-10-CM | POA: Insufficient documentation

## 2023-10-23 DIAGNOSIS — E871 Hypo-osmolality and hyponatremia: Secondary | ICD-10-CM | POA: Diagnosis not present

## 2023-10-23 DIAGNOSIS — Z79899 Other long term (current) drug therapy: Secondary | ICD-10-CM | POA: Diagnosis not present

## 2023-10-23 DIAGNOSIS — E1010 Type 1 diabetes mellitus with ketoacidosis without coma: Secondary | ICD-10-CM

## 2023-10-23 DIAGNOSIS — E1110 Type 2 diabetes mellitus with ketoacidosis without coma: Secondary | ICD-10-CM | POA: Diagnosis not present

## 2023-10-23 DIAGNOSIS — Z794 Long term (current) use of insulin: Secondary | ICD-10-CM | POA: Insufficient documentation

## 2023-10-23 DIAGNOSIS — F250 Schizoaffective disorder, bipolar type: Secondary | ICD-10-CM | POA: Diagnosis present

## 2023-10-23 DIAGNOSIS — E875 Hyperkalemia: Secondary | ICD-10-CM | POA: Diagnosis not present

## 2023-10-23 DIAGNOSIS — R3589 Other polyuria: Secondary | ICD-10-CM | POA: Diagnosis present

## 2023-10-23 DIAGNOSIS — E109 Type 1 diabetes mellitus without complications: Secondary | ICD-10-CM

## 2023-10-23 DIAGNOSIS — F411 Generalized anxiety disorder: Secondary | ICD-10-CM | POA: Diagnosis present

## 2023-10-23 DIAGNOSIS — E10649 Type 1 diabetes mellitus with hypoglycemia without coma: Secondary | ICD-10-CM

## 2023-10-23 LAB — COMPREHENSIVE METABOLIC PANEL
ALT: 46 U/L — ABNORMAL HIGH (ref 0–44)
AST: 35 U/L (ref 15–41)
Albumin: 4 g/dL (ref 3.5–5.0)
Alkaline Phosphatase: 125 U/L (ref 38–126)
Anion gap: 18 — ABNORMAL HIGH (ref 5–15)
BUN: 22 mg/dL — ABNORMAL HIGH (ref 6–20)
CO2: 21 mmol/L — ABNORMAL LOW (ref 22–32)
Calcium: 9.5 mg/dL (ref 8.9–10.3)
Chloride: 86 mmol/L — ABNORMAL LOW (ref 98–111)
Creatinine, Ser: 1.13 mg/dL (ref 0.61–1.24)
GFR, Estimated: >60 mL/min (ref >60)
Glucose, Bld: 876 mg/dL (ref 70–99)
Potassium: 6.4 mmol/L (ref 3.5–5.1)
Sodium: 125 mmol/L — ABNORMAL LOW (ref 135–145)
Total Bilirubin: 1.3 mg/dL — ABNORMAL HIGH (ref <1.2)

## 2023-10-23 LAB — URINALYSIS, ROUTINE W REFLEX MICROSCOPIC
Bacteria, UA: NONE SEEN
Bilirubin Urine: NEGATIVE
Glucose, UA: >=500 mg/dL — AB
Hgb urine dipstick: NEGATIVE
Ketones, ur: 20 mg/dL — AB
Leukocytes,Ua: NEGATIVE
Nitrite: NEGATIVE
Protein, ur: NEGATIVE mg/dL
Specific Gravity, Urine: 1.024 (ref 1.005–1.030)
pH: 5 (ref 5.0–8.0)

## 2023-10-23 LAB — CBC
HCT: 41 % (ref 39.0–52.0)
HCT: 34.5 % — ABNORMAL LOW (ref 39.0–52.0)
Hemoglobin: 13.5 g/dL (ref 13.0–17.0)
Hemoglobin: 11.5 g/dL — ABNORMAL LOW (ref 13.0–17.0)
MCH: 29.3 pg (ref 26.0–34.0)
MCH: 29.1 pg (ref 26.0–34.0)
MCHC: 32.9 g/dL (ref 30.0–36.0)
MCHC: 33.3 g/dL (ref 30.0–36.0)
MCV: 88.9 fL (ref 80.0–100.0)
MCV: 87.3 fL (ref 80.0–100.0)
Platelets: 346 10*3/uL (ref 150–400)
Platelets: 305 10*3/uL (ref 150–400)
RBC: 4.61 MIL/uL (ref 4.22–5.81)
RBC: 3.95 MIL/uL — ABNORMAL LOW (ref 4.22–5.81)
RDW: 12.9 % (ref 11.5–15.5)
RDW: 12.8 % (ref 11.5–15.5)
WBC: 8.8 10*3/uL (ref 4.0–10.5)
WBC: 7.4 10*3/uL (ref 4.0–10.5)
nRBC: 0 % (ref 0.0–0.2)
nRBC: 0 % (ref 0.0–0.2)

## 2023-10-23 LAB — I-STAT CHEM 8, ED
BUN: 25 mg/dL — ABNORMAL HIGH (ref 6–20)
Calcium, Ion: 0.99 mmol/L — ABNORMAL LOW (ref 1.15–1.40)
Chloride: 93 mmol/L — ABNORMAL LOW (ref 98–111)
Creatinine, Ser: 0.9 mg/dL (ref 0.61–1.24)
Glucose, Bld: >700 mg/dL (ref 70–99)
HCT: 43 % (ref 39.0–52.0)
Hemoglobin: 14.6 g/dL (ref 13.0–17.0)
Potassium: 5.4 mmol/L — ABNORMAL HIGH (ref 3.5–5.1)
Sodium: 126 mmol/L — ABNORMAL LOW (ref 135–145)
TCO2: 22 mmol/L (ref 22–32)

## 2023-10-23 LAB — CBG MONITORING, ED
Glucose-Capillary: >600 mg/dL (ref 70–99)
Glucose-Capillary: >600 mg/dL (ref 70–99)
Glucose-Capillary: 429 mg/dL — ABNORMAL HIGH (ref 70–99)
Glucose-Capillary: 461 mg/dL — ABNORMAL HIGH (ref 70–99)
Glucose-Capillary: 292 mg/dL — ABNORMAL HIGH (ref 70–99)
Glucose-Capillary: 218 mg/dL — ABNORMAL HIGH (ref 70–99)
Glucose-Capillary: 164 mg/dL — ABNORMAL HIGH (ref 70–99)
Glucose-Capillary: 171 mg/dL — ABNORMAL HIGH (ref 70–99)
Glucose-Capillary: 330 mg/dL — ABNORMAL HIGH (ref 70–99)
Glucose-Capillary: 392 mg/dL — ABNORMAL HIGH (ref 70–99)
Glucose-Capillary: 371 mg/dL — ABNORMAL HIGH (ref 70–99)
Glucose-Capillary: 293 mg/dL — ABNORMAL HIGH (ref 70–99)

## 2023-10-23 LAB — I-STAT VENOUS BLOOD GAS, ED
Acid-Base Excess: 0 mmol/L (ref 0.0–2.0)
Bicarbonate: 24.1 mmol/L (ref 20.0–28.0)
Calcium, Ion: 1.11 mmol/L — ABNORMAL LOW (ref 1.15–1.40)
HCT: 43 % (ref 39.0–52.0)
Hemoglobin: 14.6 g/dL (ref 13.0–17.0)
O2 Saturation: 95 %
Potassium: 5.9 mmol/L — ABNORMAL HIGH (ref 3.5–5.1)
Sodium: 124 mmol/L — ABNORMAL LOW (ref 135–145)
TCO2: 25 mmol/L (ref 22–32)
pCO2, Ven: 36.4 mm[Hg] — ABNORMAL LOW (ref 44–60)
pH, Ven: 7.429 (ref 7.25–7.43)
pO2, Ven: 74 mm[Hg] — ABNORMAL HIGH (ref 32–45)

## 2023-10-23 LAB — BASIC METABOLIC PANEL
Anion gap: 10 (ref 5–15)
Anion gap: 11 (ref 5–15)
Anion gap: 11 (ref 5–15)
BUN: 22 mg/dL — ABNORMAL HIGH (ref 6–20)
BUN: 18 mg/dL (ref 6–20)
BUN: 19 mg/dL (ref 6–20)
CO2: 26 mmol/L (ref 22–32)
CO2: 24 mmol/L (ref 22–32)
CO2: 25 mmol/L (ref 22–32)
Calcium: 9.5 mg/dL (ref 8.9–10.3)
Calcium: 9.2 mg/dL (ref 8.9–10.3)
Calcium: 9.2 mg/dL (ref 8.9–10.3)
Chloride: 103 mmol/L (ref 98–111)
Chloride: 103 mmol/L (ref 98–111)
Chloride: 99 mmol/L (ref 98–111)
Creatinine, Ser: 0.99 mg/dL (ref 0.61–1.24)
Creatinine, Ser: 0.67 mg/dL (ref 0.61–1.24)
Creatinine, Ser: 1.32 mg/dL — ABNORMAL HIGH (ref 0.61–1.24)
GFR, Estimated: >60 mL/min (ref >60)
GFR, Estimated: >60 mL/min (ref >60)
GFR, Estimated: >60 mL/min (ref >60)
Glucose, Bld: 272 mg/dL — ABNORMAL HIGH (ref 70–99)
Glucose, Bld: 164 mg/dL — ABNORMAL HIGH (ref 70–99)
Glucose, Bld: 459 mg/dL — ABNORMAL HIGH (ref 70–99)
Potassium: 4.2 mmol/L (ref 3.5–5.1)
Potassium: 4.2 mmol/L (ref 3.5–5.1)
Potassium: 5 mmol/L (ref 3.5–5.1)
Sodium: 139 mmol/L (ref 135–145)
Sodium: 138 mmol/L (ref 135–145)
Sodium: 135 mmol/L (ref 135–145)

## 2023-10-23 LAB — POTASSIUM: Potassium: 4.1 mmol/L (ref 3.5–5.1)

## 2023-10-23 LAB — ETHANOL: Alcohol, Ethyl (B): <10 mg/dL (ref <10)

## 2023-10-23 LAB — RAPID URINE DRUG SCREEN, HOSP PERFORMED
Amphetamines: NOT DETECTED
Barbiturates: NOT DETECTED
Benzodiazepines: NOT DETECTED
Cocaine: POSITIVE — AB
Opiates: NOT DETECTED
Tetrahydrocannabinol: NOT DETECTED

## 2023-10-23 LAB — GLUCOSE, CAPILLARY
Glucose-Capillary: 320 mg/dL — ABNORMAL HIGH (ref 70–99)
Glucose-Capillary: 430 mg/dL — ABNORMAL HIGH (ref 70–99)

## 2023-10-23 LAB — BETA-HYDROXYBUTYRIC ACID: Beta-Hydroxybutyric Acid: 4.3 mmol/L — ABNORMAL HIGH (ref 0.05–0.27)

## 2023-10-23 LAB — HEMOGLOBIN A1C
Hgb A1c MFr Bld: 11.4 % — ABNORMAL HIGH (ref 4.8–5.6)
Mean Plasma Glucose: 280.48 mg/dL

## 2023-10-23 LAB — OSMOLALITY: Osmolality: 329 mosm/kg (ref 275–295)

## 2023-10-23 MED ORDER — INSULIN GLARGINE-YFGN 100 UNIT/ML ~~LOC~~ SOLN
24.0000 [IU] | Freq: Every day | SUBCUTANEOUS | Status: DC
  Administered 2023-10-23 – 2023-10-24 (×2): 24 [IU] via SUBCUTANEOUS
  Filled 2023-10-23 (×2): qty 0.24

## 2023-10-23 MED ORDER — INSULIN ASPART 100 UNIT/ML IJ SOLN: 0.0000 [IU] | Freq: Three times a day (TID) | INTRAMUSCULAR | Status: DC

## 2023-10-23 MED ORDER — SODIUM CHLORIDE 0.9 % IV BOLUS
1000.0000 mL | Freq: Once | INTRAVENOUS | Status: AC
  Administered 2023-10-23: 1000 mL via INTRAVENOUS

## 2023-10-23 MED ORDER — INSULIN ASPART 100 UNIT/ML IJ SOLN
4.0000 [IU] | Freq: Once | INTRAMUSCULAR | Status: AC
  Administered 2023-10-24: 4 [IU] via SUBCUTANEOUS

## 2023-10-23 MED ORDER — PALIPERIDONE ER 6 MG PO TB24
6.0000 mg | ORAL_TABLET | Freq: Every day | ORAL | Status: DC
Start: 2023-10-23 — End: 2023-10-24
  Administered 2023-10-23 – 2023-10-24 (×2): 6 mg via ORAL
  Filled 2023-10-23 (×3): qty 1

## 2023-10-23 MED ORDER — DEXTROSE IN LACTATED RINGERS 5 % IV SOLN: INTRAVENOUS | Status: DC

## 2023-10-23 MED ORDER — INSULIN ASPART 100 UNIT/ML IJ SOLN
7.0000 [IU] | Freq: Three times a day (TID) | INTRAMUSCULAR | Status: DC
  Administered 2023-10-23: 7 [IU] via SUBCUTANEOUS

## 2023-10-23 MED ORDER — DEXTROSE 50 % IV SOLN: 0.0000 mL | INTRAVENOUS | Status: DC | PRN

## 2023-10-23 MED ORDER — INSULIN REGULAR(HUMAN) IN NACL 100-0.9 UT/100ML-% IV SOLN
INTRAVENOUS | Status: DC
  Administered 2023-10-23: 8.5 [IU]/h via INTRAVENOUS
  Filled 2023-10-23: qty 100

## 2023-10-23 MED ORDER — INSULIN ASPART 100 UNIT/ML IJ SOLN
0.0000 [IU] | Freq: Three times a day (TID) | INTRAMUSCULAR | Status: DC
  Administered 2023-10-23: 15 [IU] via SUBCUTANEOUS

## 2023-10-23 MED ORDER — ACETAMINOPHEN 325 MG PO TABS: 650.0000 mg | ORAL_TABLET | Freq: Four times a day (QID) | ORAL | Status: DC | PRN

## 2023-10-23 MED ORDER — LACTATED RINGERS IV SOLN: INTRAVENOUS | Status: DC

## 2023-10-23 MED ORDER — ACETAMINOPHEN 650 MG RE SUPP: 650.0000 mg | Freq: Four times a day (QID) | RECTAL | Status: DC | PRN

## 2023-10-23 MED ORDER — INSULIN ASPART 100 UNIT/ML IJ SOLN
0.0000 [IU] | Freq: Every day | INTRAMUSCULAR | Status: DC
  Administered 2023-10-23: 5 [IU] via SUBCUTANEOUS

## 2023-10-23 MED ORDER — RIVAROXABAN 10 MG PO TABS
10.0000 mg | ORAL_TABLET | Freq: Every day | ORAL | Status: DC
  Administered 2023-10-23 – 2023-10-24 (×2): 10 mg via ORAL
  Filled 2023-10-23 (×2): qty 1

## 2023-10-23 NOTE — ED Notes (Signed)
ED TO INPATIENT HANDOFF REPORT  ED Nurse Name and Phone #: [phone]  S Name/Age/Gender Jacob [name] 32 y.o. male Room/Bed: 002C/002C  Code Status   Code Status: Full Code  Home/SNF/Other Home Patient oriented to: self, place, time, and situation Is this baseline? Yes   Triage Complete: Triage complete  Chief Complaint DKA (diabetic ketoacidosis) (HCC) [E11.10]  Triage Note Patient states his blood sugar is high. States he didn't take his insulin because he didn't have time. Also states his left shoulder is hurting. Denies injury to the shoulder.     Allergies No Known Allergies  Level of Care/Admitting Diagnosis ED Disposition     ED Disposition  Admit   Condition  --   Comment  Hospital Area: [facility] [[id number]]  Level of Care: Progressive [102]  Admit to Progressive based on following criteria: GI, ENDOCRINE disease patients with GI bleeding, acute liver failure or pancreatitis, stable with diabetic ketoacidosis or thyrotoxicosis (hypothyroid) state.  May place patient in observation at [facility] or [facility] if equivalent level of care is available:: No  Covid Evaluation: Asymptomatic - no recent exposure (last 10 days) testing not required  Diagnosis: DKA (diabetic ketoacidosis) [facility]) [[id number]]  Admitting Physician: Tyson Alias [[id number]]  Attending Physician: Tyson Alias [phone]          B Medical/Surgery History Past Medical History:  Diagnosis Date   Bipolar 1 disorder (HCC)    History of attempted suicide 2019   Unsuccessful suicide attempt in 2019 with rat poison   Schizoaffective disorder (HCC)    Type 1 diabetes mellitus on insulin therapy (HCC) 2019   History reviewed. No pertinent surgical history.   A IV Location/Drains/Wounds Patient Lines/Drains/Airways Status     Active Line/Drains/Airways     Name Placement date Placement time Site Days   Peripheral IV 10/23/23 20 G Anterior;Proximal;Right  Forearm 10/23/23  0315  Forearm  less than 1   Peripheral IV 10/23/23 20 G Anterior;Left;Proximal Forearm 10/23/23  0410  Forearm  less than 1            Intake/Output Last 24 hours  Intake/Output Summary (Last 24 hours) at 10/23/2023 1714 Last data filed at 10/23/2023 0102 Gross per 24 hour  Intake 2001.3 ml  Output 1750 ml  Net 251.3 ml    Labs/Imaging Results for orders placed or performed during the hospital encounter of 10/23/23 (from the past 48 hours)  CBG monitoring, ED     Status: Abnormal   Collection Time: 10/23/23  3:05 AM  Result Value Ref Range   Glucose-Capillary >600 (HH) 70 - 99 mg/dL    Comment: Glucose reference range applies only to samples taken after fasting for at least 8 hours.  CBC     Status: None   Collection Time: 10/23/23  3:16 AM  Result Value Ref Range   WBC 8.8 4.0 - 10.5 K/uL   RBC 4.61 4.22 - 5.81 MIL/uL   Hemoglobin 13.5 13.0 - 17.0 g/dL   HCT 72.5 36.6 - 44.0 %   MCV 88.9 80.0 - 100.0 fL   MCH 29.3 26.0 - 34.0 pg   MCHC 32.9 30.0 - 36.0 g/dL   RDW 34.7 42.5 - 95.6 %   Platelets 346 150 - 400 K/uL   nRBC 0.0 0.0 - 0.2 %    Comment: Performed at [facility], [address] [place], [place] [address]  Urinalysis, Routine w reflex microscopic -Urine, Clean Catch  Status: Abnormal   Collection Time: 10/23/23  3:16 AM  Result Value Ref Range   Color, Urine COLORLESS (A) YELLOW   APPearance CLEAR CLEAR   Specific Gravity, Urine 1.024 1.005 - 1.030   pH 5.0 5.0 - 8.0   Glucose, UA >=500 (A) NEGATIVE mg/dL   Hgb urine dipstick NEGATIVE NEGATIVE   Bilirubin Urine NEGATIVE NEGATIVE   Ketones, ur 20 (A) NEGATIVE mg/dL   Protein, ur NEGATIVE NEGATIVE mg/dL   Nitrite NEGATIVE NEGATIVE   Leukocytes,Ua NEGATIVE NEGATIVE   RBC / HPF 0-5 0 - 5 RBC/hpf   WBC, UA 0-5 0 - 5 WBC/hpf   Bacteria, UA NONE SEEN NONE SEEN   Squamous Epithelial / HPF 0-5 0 - 5 /HPF    Comment: Performed at [facility], [address]  [place], [place] [address]  Rapid urine drug screen (hospital performed)     Status: Abnormal   Collection Time: 10/23/23  3:16 AM  Result Value Ref Range   Opiates NONE DETECTED NONE DETECTED   Cocaine POSITIVE (A) NONE DETECTED   Benzodiazepines NONE DETECTED NONE DETECTED   Amphetamines NONE DETECTED NONE DETECTED   Tetrahydrocannabinol NONE DETECTED NONE DETECTED   Barbiturates NONE DETECTED NONE DETECTED    Comment: (NOTE) DRUG SCREEN FOR MEDICAL PURPOSES ONLY.  IF CONFIRMATION IS NEEDED FOR ANY PURPOSE, NOTIFY LAB WITHIN 5 DAYS.  LOWEST DETECTABLE LIMITS FOR URINE DRUG SCREEN Drug Class                     Cutoff (ng/mL) Amphetamine and metabolites    1000 Barbiturate and metabolites    200 Benzodiazepine                 200 Opiates and metabolites        300 Cocaine and metabolites        300 THC                            50 Performed at [facility], [address], [place], [place] [address]   I-stat chem 8, ed     Status: Abnormal   Collection Time: 10/23/23  3:23 AM  Result Value Ref Range   Sodium 126 (L) 135 - 145 mmol/L   Potassium 5.4 (H) 3.5 - 5.1 mmol/L   Chloride 93 (L) 98 - 111 mmol/L   BUN 25 (H) 6 - 20 mg/dL   Creatinine, Ser 0.98 0.61 - 1.24 mg/dL   Glucose, Bld >119 (HH) 70 - 99 mg/dL    Comment: Glucose reference range applies only to samples taken after fasting for at least 8 hours.   Calcium, Ion 0.99 (L) 1.15 - 1.40 mmol/L   TCO2 22 22 - 32 mmol/L   Hemoglobin 14.6 13.0 - 17.0 g/dL   HCT 14.7 82.9 - 56.2 %  Comprehensive metabolic panel     Status: Abnormal   Collection Time: 10/23/23  4:01 AM  Result Value Ref Range   Sodium 125 (L) 135 - 145 mmol/L   Potassium 6.4 (HH) 3.5 - 5.1 mmol/L    Comment: HEMOLYSIS AT THIS LEVEL MAY AFFECT RESULT CRITICAL RESULT CALLED TO, READ BACK BY AND VERIFIED WITH I.  LANE RN 10/23/23 @0527  BY J. WHITE    Chloride 86 (L) 98 - 111 mmol/L   CO2 21 (L) 22 - 32 mmol/L   Glucose, Bld 876 (HH) 70 - 99 mg/dL  Comment: Glucose reference range applies only to samples taken after fasting for at least 8 hours. CRITICAL RESULT CALLED TO, READ BACK BY AND VERIFIED WITH I.  LANE RN 10/23/23 @0527  BY J. WHITE    BUN 22 (H) 6 - 20 mg/dL   Creatinine, Ser 9.14 0.61 - 1.24 mg/dL   Calcium 9.5 8.9 - 78.2 mg/dL   Total Protein NOT CALCULATED 6.5 - 8.1 g/dL    Comment: UNABLE TO PERFORM DUE TO LIPEMIC INTERFERENCE   Albumin 4.0 3.5 - 5.0 g/dL   AST 35 15 - 41 U/L   ALT 46 (H) 0 - 44 U/L   Alkaline Phosphatase 125 38 - 126 U/L   Total Bilirubin 1.3 (H) <1.2 mg/dL   GFR, Estimated >95 >62 mL/min    Comment: (NOTE) Calculated using the CKD-EPI Creatinine Equation (2021)    Anion gap 18 (H) 5 - 15    Comment: Performed at [facility], [address] [place], [place] [address]  Beta-hydroxybutyric acid     Status: Abnormal   Collection Time: 10/23/23  4:01 AM  Result Value Ref Range   Beta-Hydroxybutyric Acid 4.30 (H) 0.05 - 0.27 mmol/L    Comment: Performed at [facility], [address], [place], [place] [address]  Hemoglobin A1c     Status: Abnormal   Collection Time: 10/23/23  4:01 AM  Result Value Ref Range   Hgb A1c MFr Bld 11.4 (H) 4.8 - 5.6 %    Comment: (NOTE) Pre diabetes:          5.7%-6.4%  Diabetes:              >6.4%  Glycemic control for   <7.0% adults with diabetes    Mean Plasma Glucose 280.48 mg/dL    Comment: Performed at [facility], [address] [place], [place] [address]  Ethanol     Status: None   Collection Time: 10/23/23  4:01 AM  Result Value Ref Range   Alcohol, Ethyl (B) <10 <10 mg/dL    Comment: (NOTE) Lowest detectable limit for serum alcohol is 10 mg/dL.  For medical purposes only. Performed at [facility], [address] [place], [place] [address]   I-Stat venous blood gas, [facility], [facility], [facility])     Status: Abnormal   Collection Time: 10/23/23  4:06 AM  Result Value Ref Range   pH, Ven 7.429 7.25 - 7.43   pCO2, Ven 36.4 (L) 44  - 60 mmHg   pO2, Ven 74 (H) 32 - 45 mmHg   Bicarbonate 24.1 20.0 - 28.0 mmol/L   TCO2 25 22 - 32 mmol/L   O2 Saturation 95 %   Acid-Base Excess 0.0 0.0 - 2.0 mmol/L   Sodium 124 (L) 135 - 145 mmol/L   Potassium 5.9 (H) 3.5 - 5.1 mmol/L   Calcium, Ion 1.11 (L) 1.15 - 1.40 mmol/L   HCT 43.0 39.0 - 52.0 %   Hemoglobin 14.6 13.0 - 17.0 g/dL   Sample type VENOUS   CBG monitoring, ED     Status: Abnormal   Collection Time: 10/23/23  6:20 AM  Result Value Ref Range   Glucose-Capillary >600 (HH) 70 - 99 mg/dL    Comment: Glucose reference range applies only to samples taken after fasting for at least 8 hours.  CBG monitoring, ED     Status: Abnormal   Collection Time: 10/23/23  6:53 AM  Result Value Ref Range   Glucose-Capillary 461 (  H) 70 - 99 mg/dL    Comment: Glucose reference range applies only to samples taken after fasting for at least 8 hours.  Potassium     Status: None   Collection Time: 10/23/23  6:54 AM  Result Value Ref Range   Potassium 4.1 3.5 - 5.1 mmol/L    Comment: Performed at [facility], [address], [place], [place] [address]  Osmolality     Status: Abnormal   Collection Time: 10/23/23  6:54 AM  Result Value Ref Range   Osmolality 329 (HH) 275 - 295 mOsm/kg    Comment: REPEATED TO VERIFY CRITICAL RESULT CALLED TO, READ BACK BY AND VERIFIED WITH: A. REYNOLDS RN 10/23/23 @0811  BY J. WHITE Performed at [facility], [address] [place], [place] [address]   CBG monitoring, ED     Status: Abnormal   Collection Time: 10/23/23  7:28 AM  Result Value Ref Range   Glucose-Capillary 429 (H) 70 - 99 mg/dL    Comment: Glucose reference range applies only to samples taken after fasting for at least 8 hours.  CBG monitoring, ED     Status: Abnormal   Collection Time: 10/23/23  8:23 AM  Result Value Ref Range   Glucose-Capillary 292 (H) 70 - 99 mg/dL    Comment: Glucose reference range applies only to samples taken after fasting for at least 8 hours.   Basic metabolic panel     Status: Abnormal   Collection Time: 10/23/23  8:57 AM  Result Value Ref Range   Sodium 139 135 - 145 mmol/L    Comment: DELTA CHECK NOTED   Potassium 4.2 3.5 - 5.1 mmol/L    Comment: HEMOLYSIS AT THIS LEVEL MAY AFFECT RESULT   Chloride 103 98 - 111 mmol/L   CO2 26 22 - 32 mmol/L   Glucose, Bld 272 (H) 70 - 99 mg/dL    Comment: Glucose reference range applies only to samples taken after fasting for at least 8 hours.   BUN 22 (H) 6 - 20 mg/dL   Creatinine, Ser 6.57 0.61 - 1.24 mg/dL   Calcium 9.5 8.9 - 84.6 mg/dL   GFR, Estimated >96 >29 mL/min    Comment: (NOTE) Calculated using the CKD-EPI Creatinine Equation (2021)    Anion gap 10 5 - 15    Comment: Performed at [facility], [address], [place], [place] [address]  CBG monitoring, ED     Status: Abnormal   Collection Time: 10/23/23  9:41 AM  Result Value Ref Range   Glucose-Capillary 218 (H) 70 - 99 mg/dL    Comment: Glucose reference range applies only to samples taken after fasting for at least 8 hours.  CBG monitoring, ED     Status: Abnormal   Collection Time: 10/23/23 11:09 AM  Result Value Ref Range   Glucose-Capillary 164 (H) 70 - 99 mg/dL    Comment: Glucose reference range applies only to samples taken after fasting for at least 8 hours.  Basic metabolic panel     Status: Abnormal   Collection Time: 10/23/23 11:22 AM  Result Value Ref Range   Sodium 138 135 - 145 mmol/L    Comment: DELTA CHECK NOTED   Potassium 4.2 3.5 - 5.1 mmol/L   Chloride 103 98 - 111 mmol/L   CO2 24 22 - 32 mmol/L   Glucose, Bld 164 (H) 70 - 99 mg/dL    Comment: Glucose reference range applies only to samples taken after  fasting for at least 8 hours.   BUN 18 6 - 20 mg/dL   Creatinine, Ser 1.32 0.61 - 1.24 mg/dL   Calcium 9.2 8.9 - 44.0 mg/dL   GFR, Estimated >10 >27 mL/min    Comment: (NOTE) Calculated using the CKD-EPI Creatinine Equation (2021)    Anion gap 11 5 - 15    Comment: Performed at [facility], [address], [place], [place] [address]  CBG monitoring, ED     Status: Abnormal   Collection Time: 10/23/23 12:28 PM  Result Value Ref Range   Glucose-Capillary 171 (H) 70 - 99 mg/dL    Comment: Glucose reference range applies only to samples taken after fasting for at least 8 hours.  CBG monitoring, ED     Status: Abnormal   Collection Time: 10/23/23  1:46 PM  Result Value Ref Range   Glucose-Capillary 330 (H) 70 - 99 mg/dL    Comment: Glucose reference range applies only to samples taken after fasting for at least 8 hours.   Comment 1 Notify RN    Comment 2 Document in Chart   CBG monitoring, ED     Status: Abnormal   Collection Time: 10/23/23  2:43 PM  Result Value Ref Range   Glucose-Capillary 392 (H) 70 - 99 mg/dL    Comment: Glucose reference range applies only to samples taken after fasting for at least 8 hours.  Basic metabolic panel     Status: Abnormal   Collection Time: 10/23/23  4:06 PM  Result Value Ref Range   Sodium 135 135 - 145 mmol/L   Potassium 5.0 3.5 - 5.1 mmol/L   Chloride 99 98 - 111 mmol/L   CO2 25 22 - 32 mmol/L   Glucose, Bld 459 (H) 70 - 99 mg/dL    Comment: Glucose reference range applies only to samples taken after fasting for at least 8 hours.   BUN 19 6 - 20 mg/dL   Creatinine, Ser 4.40 (H) 0.61 - 1.24 mg/dL   Calcium 9.2 8.9 - 34.7 mg/dL   GFR, Estimated >42 >59 mL/min    Comment: (NOTE) Calculated using the CKD-EPI Creatinine Equation (2021)    Anion gap 11 5 - 15    Comment: Performed at [facility], [address], [place], [place] [address]  CBG monitoring, ED     Status: Abnormal   Collection Time: 10/23/23  4:31 PM  Result Value Ref Range   Glucose-Capillary 371 (H) 70 - 99 mg/dL    Comment: Glucose reference range applies only to samples taken after fasting for at least 8 hours.   DG Shoulder Left Port Result Date: 10/23/2023 CLINICAL DATA:  Left shoulder pain. EXAM: LEFT SHOULDER COMPARISON:  September 04, 2016  FINDINGS: There is no evidence of fracture or dislocation. There is no evidence of arthropathy. A stable 9 mm bone island is seen within the left humeral head. Soft tissues are unremarkable. IMPRESSION: Negative. Electronically Signed   By: Aram Candela M.D.   On: 10/23/2023 03:59    Pending Labs Unresulted Labs (From admission, onward)    None       Vitals/Pain Today's Vitals   10/23/23 1330 10/23/23 1430 10/23/23 1515 10/23/23 1608  BP: 114/69 109/67 103/66   Pulse: 81 80 80   Resp: 12 11 11    Temp:    98.4 F (36.9 C)  TempSrc:    Oral  SpO2: 99% 98% 100%   PainSc:  Isolation Precautions No active isolations  Medications Medications  rivaroxaban (XARELTO) tablet 10 mg (10 mg Oral Given 10/23/23 0949)  acetaminophen (TYLENOL) tablet 650 mg (has no administration in time range)    Or  acetaminophen (TYLENOL) suppository 650 mg (has no administration in time range)  paliperidone (INVEGA) 24 hr tablet 6 mg (6 mg Oral Given 10/23/23 0949)  insulin glargine-yfgn (SEMGLEE) injection 24 Units (24 Units Subcutaneous Given 10/23/23 1348)  insulin aspart (novoLOG) injection 0-15 Units (15 Units Subcutaneous Given 10/23/23 1638)  insulin aspart (novoLOG) injection 7 Units (7 Units Subcutaneous Given 10/23/23 1640)  sodium chloride 0.9 % bolus 1,000 mL (0 mLs Intravenous Stopped 10/23/23 0517)  sodium chloride 0.9 % bolus 1,000 mL (0 mLs Intravenous Stopped 10/23/23 0651)    Mobility walks     Focused Assessments Renal Assessment Handoff:     R Recommendations: See Admitting Provider Note  Report given to:   Additional Notes:

## 2023-10-23 NOTE — ED Notes (Signed)
Pt called multiple times no answer 
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
## 2023-10-23 NOTE — Plan of Care (Signed)
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
## 2023-10-23 NOTE — Progress Notes (Signed)
CSW received consult patient for homeless and substance abuse resources - resources were added to AVS.  Edwin Dada, MSW, LCSW Transitions of Care  Clinical Social Worker II [phone]
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
## 2023-10-23 NOTE — ED Triage Notes (Addendum)
Patient states his blood sugar is high. States he didn't take his insulin because he didn't have time. Also states his left shoulder is hurting. Denies injury to the shoulder.

## 2023-10-23 NOTE — Discharge Instructions (Signed)

## 2023-10-24 ENCOUNTER — Other Ambulatory Visit (HOSPITAL_COMMUNITY): Payer: Self-pay

## 2023-10-24 ENCOUNTER — Telehealth: Payer: Self-pay | Admitting: *Deleted

## 2023-10-24 LAB — BASIC METABOLIC PANEL
Anion gap: 8 (ref 5–15)
BUN: 25 mg/dL — ABNORMAL HIGH (ref 6–20)
CO2: 25 mmol/L (ref 22–32)
Calcium: 8.8 mg/dL — ABNORMAL LOW (ref 8.9–10.3)
Chloride: 104 mmol/L (ref 98–111)
Creatinine, Ser: 1.22 mg/dL (ref 0.61–1.24)
GFR, Estimated: >60 mL/min (ref >60)
Glucose, Bld: 67 mg/dL — ABNORMAL LOW (ref 70–99)
Potassium: 3.8 mmol/L (ref 3.5–5.1)
Sodium: 137 mmol/L (ref 135–145)

## 2023-10-24 LAB — GLUCOSE, CAPILLARY: Glucose-Capillary: 79 mg/dL (ref 70–99)

## 2023-10-24 MED ORDER — BLOOD GLUCOSE MONITOR SYSTEM W/DEVICE KIT
1.0000 | PACK | Freq: Three times a day (TID) | 0 refills | Status: DC
  Filled 2023-10-24: qty 1, 30d supply, fill #0

## 2023-10-24 MED ORDER — TRUEPLUS LANCETS 28G MISC
1.0000 | Freq: Three times a day (TID) | 0 refills | Status: DC
  Filled 2023-10-24: qty 100, 30d supply, fill #0

## 2023-10-24 MED ORDER — LANCET DEVICE MISC
1.0000 | Freq: Three times a day (TID) | 0 refills | Status: DC
  Filled 2023-10-24: qty 1, 30d supply, fill #0

## 2023-10-24 MED ORDER — INSULIN GLARGINE 100 UNIT/ML SOLOSTAR PEN
PEN_INJECTOR | SUBCUTANEOUS | 0 refills | Status: DC
  Filled 2023-10-24: qty 15, 62d supply, fill #0
  Filled 2023-10-24: qty 9, 30d supply, fill #0
  Filled 2023-10-24: qty 15, 63d supply, fill #0

## 2023-10-24 MED ORDER — BLOOD GLUCOSE TEST VI STRP
1.0000 | ORAL_STRIP | Freq: Three times a day (TID) | 0 refills | Status: DC
  Filled 2023-10-24: qty 100, 30d supply, fill #0

## 2023-10-24 MED ORDER — INSULIN PEN NEEDLE 32G X 4 MM MISC
1.0000 | Freq: Three times a day (TID) | 0 refills | Status: DC
  Filled 2023-10-24 (×2): qty 100, 30d supply, fill #0

## 2023-10-24 MED ORDER — NOVOLOG FLEXPEN 100 UNIT/ML ~~LOC~~ SOPN
PEN_INJECTOR | SUBCUTANEOUS | 0 refills | Status: DC
  Filled 2023-10-24: qty 6, 28d supply, fill #0
  Filled 2023-10-24: qty 15, fill #0
  Filled 2023-11-15 – 2023-12-15 (×4): qty 6, 28d supply, fill #1

## 2023-10-24 NOTE — Inpatient Diabetes Management (Signed)
Inpatient Diabetes Program Recommendations  AACE/ADA: New Consensus Statement on Inpatient Glycemic Control   Target Ranges:  Prepandial:   less than 140 mg/dL      Peak postprandial:   less than 180 mg/dL (1-2 hours)      Critically ill patients:  140 - 180 mg/dL    Latest Reference Range & Units 10/24/23 06:43  Glucose-Capillary 70 - 99 mg/dL 79    Latest Reference Range & Units 10/23/23 12:28 10/23/23 13:46 10/23/23 14:43 10/23/23 16:31 10/23/23 17:38 10/23/23 21:42 10/23/23 23:47  Glucose-Capillary 70 - 99 mg/dL 409 (H) 811 (H) 914 (H) 371 (H) 293 (H) 430 (H) 320 (H)    Latest Reference Range & Units 10/23/23 04:01  CO2 22 - 32 mmol/L 21 (L)  Glucose 70 - 99 mg/dL 782 (HH)  Mean Plasma Glucose mg/dL 956.21  Anion gap 5 - 15  18 (H)    Latest Reference Range & Units 10/23/23 04:01  Beta-Hydroxybutyric Acid 0.05 - 0.27 mmol/L 4.30 (H)    Latest Reference Range & Units 02/25/23 11:01 06/29/23 15:40 10/10/23 09:48 10/23/23 04:01  Hemoglobin A1C 4.8 - 5.6 % 12.7 ! 10.5 ! 11.1 ! 11.4 (H)   Review of Glycemic Control  Diabetes history: DM1 Outpatient Diabetes medications: Lantus 24 units daily, Novolog 7 uints TID with meals Current orders for Inpatient glycemic control: Semglee 24 units daily, Novolog 7 units TID with meals, Novolog 0-15 units TID with meals, Novolog 0-5 units QHS  Inpatient Diabetes Program Recommendations:    Insulin: CBG 79 mg/dl this morning. Please consider decreasing Semglee to 20 units daily.   NOTE: Patient admitted with DKA secondary to insulin nonadherence and cocaine use . Per H&P on 10/23/23, patient has not taken insulin 2-3 days; "He reported not having insulin supplies last few days. His mother showed me his book bag which had an unopened box of Lantus pens and 2 boxes of NovoLog pens with other supplies. Mother had given him this but back to keep his supplies near him."  Patient is followed by [facility] and was last seen  10/10/23 and it is noted patient is homeless and has many barriers to medical care; also noted patient last seen [name], RD,diabetes educator on 08/22/23 as well.   Thanks, Orlando Penner, RN, MSN, CDCES Diabetes Coordinator Inpatient Diabetes Program [phone] (Team Pager from 8am to 5pm)

## 2023-10-24 NOTE — Telephone Encounter (Signed)
Please call grandmother Hamzah Antwine at [phone] as she is asking what insulin her grandson should be taking.

## 2023-10-24 NOTE — Discharge Summary (Signed)
Name: Jacob [name] [name] MRN: [id number] DOB: 05-01-1991 32 y.o. PCP: [name], MD  Date of Admission: 10/23/2023  2:45 AM Date of Discharge:  10/24/23 Attending Physician: Dr.  Ninetta Lights  DISCHARGE DIAGNOSIS:  Primary Problem: DKA (diabetic ketoacidosis) [facility])   Hospital Problems: Principal Problem:   DKA (diabetic ketoacidosis) (HCC) Active Problems:   Schizoaffective disorder, manic type (HCC)   Generalized anxiety disorder    DISCHARGE MEDICATIONS:   Allergies as of 10/24/2023   No Known Allergies      Medication List     TAKE these medications    BD Pen Needle Nano 2nd Gen 32G X 4 MM Misc Generic drug: Insulin Pen Needle Inject 1 each into the skin 4 (four) times daily.   Dexcom G6 Transmitter Misc USE AS DIRECTED FOR CONTINUOUS GLUCOSE MONITORING. USE TRANSMITTER FOR 90 DAYS. DISCARD AND REPLACE.   Dexcom G7 Receiver Devi Use with Dexcom G7 sensors to continuously monitor blood sugar.   Dexcom G7 Sensor Misc Place new sensor every 10 days. Use to continuously monitor blood sugar.   glucagon 1 MG injection Inject 1 mg into the skin See admin instructions. Follow package directions for low blood sugar.   insulin glargine 100 UNIT/ML injection Commonly known as: LANTUS Inject 24 units into the skin daily.   NovoLOG FlexPen 100 UNIT/ML FlexPen Generic drug: insulin aspart Inject 7 units before breakfast, lunch and dinner   paliperidone 6 MG 24 hr tablet Commonly known as: INVEGA Take 6 mg by mouth daily.        DISPOSITION AND FOLLOW-UP:  JacobJacob [name] [name] was discharged from [facility] in Fair condition. At the hospital follow up visit please address:  Follow-up Recommendations: Consults: None Labs:  CBC, BMP Studies: None Medications: Lantus, Novolog   Follow-up Appointments:  Follow-up Information     [name], MD Follow up.   Specialty: Internal Medicine Contact information: [address] [place]  [address] [phone]                 HOSPITAL COURSE:  Patient Summary: #DKA  #Uncontrolled Type 1 Diabetes Last A1c was 11.4%. Labs were notable for pH of 7.429 with bicarb slightly low at 21. Anion gap elevated at 18. BHB elevated to 4.3. Likely secondary to insulin nonadherence. Placed on Endotool. At discharge, glucose decreased at 67 but patient was asymptomatic. He was given Lantus and Novolog from TOC. Scheduled for follow-up with Dr. Carlynn Purl on 11/08/23.   #Psydohyponatremia Na at 125 with glucose of 876 on CMP. Na corrected to 137-144. At discharge, Na normalized to 137.   #Hyperkalemia Likely secondary to hemolyzed blood. No peaked T waves on EKG and unchanged from prior. No concerns at discharge and normalized at 3.8.   #Schizoaffective Disorder Recently IVC'd from 12/9 to 12/11. Patient continued on paliperidone 6 mg.    DISCHARGE INSTRUCTIONS:   Discharge Instructions     Diet general   Complete by: As directed    Discharge instructions   Complete by: As directed    Jacob [name] [name] were hospitalized for DKA that was caused by not taking your insulin.  Your glucose levels have normalized, and we feel that you are safe for discharge.  It is important that you continue to take your insulin as prescribed to prevent future hospitalizations.  **For your Novolog, please take 7 units of NovoLog before breakfast, lunch, and dinner. For your Lantus, please take 24 units before bed.**  Please continue to take your  other medications as prescribed.  If you have any questions, please contact us at [phone].  We are glad that you are feeling better!   Increase activity slowly   Complete by: As directed        SUBJECTIVE:  Patient was evaluated at bedside. Denied any lightheadedness, dizziness, nausea, vomiting, or any other concerns. Patient counseled on importance of adhering to medication regimen. Patient understanding of importance and notified he will be given  insulin at discharge.  Discharge Vitals:   BP 106/62 (BP Location: Left Arm)   Pulse 73   Temp 97.8 F (36.6 C) (Oral)   Resp 18   Ht 5\' 9"  (1.753 m)   Wt 56.7 kg   SpO2 100%   BMI 18.46 kg/m   OBJECTIVE:  Physical Exam Constitutional:      Appearance: Normal appearance.  HENT:     Head: Normocephalic and atraumatic.  Cardiovascular:     Rate and Rhythm: Normal rate and regular rhythm.     Pulses: Normal pulses.     Heart sounds: Normal heart sounds.  Pulmonary:     Effort: Pulmonary effort is normal.     Breath sounds: Normal breath sounds.  Abdominal:     General: Abdomen is flat. Bowel sounds are normal.     Palpations: Abdomen is soft.  Neurological:     General: No focal deficit present.     Mental Status: He is alert and oriented to person, place, and time.     Pertinent Labs, Studies, and Procedures:     Latest Ref Rng & Units 10/23/2023   11:07 PM 10/23/2023    4:06 AM 10/23/2023    3:23 AM  CBC  WBC 4.0 - 10.5 K/uL 7.4     Hemoglobin 13.0 - 17.0 g/dL 32.2  02.5  42.7   Hematocrit 39.0 - 52.0 % 34.5  43.0  43.0   Platelets 150 - 400 K/uL 305          Latest Ref Rng & Units 10/24/2023    6:02 AM 10/23/2023    4:06 PM 10/23/2023   11:22 AM  CMP  Glucose 70 - 99 mg/dL 67  062  376   BUN 6 - 20 mg/dL 25  19  18    Creatinine 0.61 - 1.24 mg/dL 2.83  1.51  7.61   Sodium 135 - 145 mmol/L 137  135  138   Potassium 3.5 - 5.1 mmol/L 3.8  5.0  4.2   Chloride 98 - 111 mmol/L 104  99  103   CO2 22 - 32 mmol/L 25  25  24    Calcium 8.9 - 10.3 mg/dL 8.8  9.2  9.2     DG Shoulder Left Port Result Date: 10/23/2023 CLINICAL DATA:  Left shoulder pain. EXAM: LEFT SHOULDER COMPARISON:  September 04, 2016 FINDINGS: There is no evidence of fracture or dislocation. There is no evidence of arthropathy. A stable 9 mm bone island is seen within the left humeral head. Soft tissues are unremarkable. IMPRESSION: Negative. Electronically Signed   By: Aram Candela M.D.   On:  10/23/2023 03:59     Signed: Morrie Sheldon, MD Internal Medicine Resident, PGY-1 [facility]  Pager: [phone]

## 2023-10-24 NOTE — Telephone Encounter (Signed)
Return call to pt's grandmother who wants to know which insulin pt is on. Per chart, I told her Lantus and Novolog.

## 2023-10-24 NOTE — Progress Notes (Addendum)
Discharge instructions reviewed with pt.  Copy of instructions given to pt. No new scripts, [facility] is  refilling scripts for his insulins, and a glucose monitor and supplies. Pt will be d/c'd via wheelchair with belongings.          Will be escorted by staff/hospital volunteer.   Pt waiting for [facility] pharmacy to complete medication fill, pt will go down to the discharge lounge while he waits for medications.  Pt is homeless and plans to walk to the [employer] when he leaves.   Prudencio Velazco,RN SWOT

## 2023-10-31 ENCOUNTER — Other Ambulatory Visit: Payer: Self-pay

## 2023-11-08 ENCOUNTER — Encounter: Payer: Self-pay | Admitting: Internal Medicine

## 2023-11-08 ENCOUNTER — Other Ambulatory Visit: Payer: Self-pay

## 2023-11-08 ENCOUNTER — Other Ambulatory Visit: Payer: Self-pay | Admitting: Internal Medicine

## 2023-11-08 ENCOUNTER — Ambulatory Visit: Payer: MEDICAID | Admitting: Internal Medicine

## 2023-11-08 ENCOUNTER — Other Ambulatory Visit (HOSPITAL_COMMUNITY): Payer: Self-pay

## 2023-11-08 VITALS — BP 132/86 | HR 91 | Temp 98.4°F | Ht 69.0 in | Wt 136.3 lb

## 2023-11-08 DIAGNOSIS — E10649 Type 1 diabetes mellitus with hypoglycemia without coma: Secondary | ICD-10-CM

## 2023-11-08 DIAGNOSIS — F250 Schizoaffective disorder, bipolar type: Secondary | ICD-10-CM | POA: Diagnosis not present

## 2023-11-08 DIAGNOSIS — E1065 Type 1 diabetes mellitus with hyperglycemia: Secondary | ICD-10-CM

## 2023-11-08 MED ORDER — INSULIN GLARGINE 100 UNIT/ML SOLOSTAR PEN: PEN_INJECTOR | SUBCUTANEOUS | 0 refills | Status: DC

## 2023-11-08 MED ORDER — V-GO 30 30 UNIT/24HR KIT: 1.0000 | PACK | Freq: Every day | 0 refills | Status: DC

## 2023-11-08 MED ORDER — OMNIPOD 5 DEXG7G6 INTRO GEN 5 KIT
1.0000 | PACK | Freq: Every day | 0 refills | Status: DC
  Filled 2023-11-08: qty 1, 30d supply, fill #0

## 2023-11-08 MED ORDER — INSULIN GLARGINE 100 UNIT/ML SOLOSTAR PEN
PEN_INJECTOR | SUBCUTANEOUS | 0 refills | Status: DC
  Filled 2023-11-08: qty 15, fill #0
  Filled 2023-11-10 – 2023-11-15 (×2): qty 15, 62d supply, fill #0
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
## 2023-11-08 NOTE — Patient Instructions (Signed)
 Mr Nickolson,   I have ordered an insulin  pump for you. Please pick this up and try using it if it works with your insurance. I will schedule another appointment in the next couple weeks to meet with us  and the diabetes coordinator. If you cannot get the pump to work, continue how you are currently dosing your insulin .   Thanks,  Dr Francella
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
## 2023-11-08 NOTE — Assessment & Plan Note (Signed)
 Patient reports he has not been taking his Invega  because he does not need it. He had pressured and tangential speech during the visit and was shouting at his mother when I left the room. He has previously been given behavioral health resources and recommended to see psychiatry, which he has not done. Recommended that patient continue taking his Invega  which he said he will do. Unsure if this will happen or not.

## 2023-11-10 ENCOUNTER — Other Ambulatory Visit (HOSPITAL_COMMUNITY): Payer: Self-pay

## 2023-11-10 ENCOUNTER — Other Ambulatory Visit: Payer: Self-pay | Admitting: Internal Medicine

## 2023-11-10 ENCOUNTER — Other Ambulatory Visit: Payer: Self-pay

## 2023-11-10 DIAGNOSIS — E10649 Type 1 diabetes mellitus with hypoglycemia without coma: Secondary | ICD-10-CM

## 2023-11-10 MED ORDER — OMNIPOD 5 DEXG7G6 PODS GEN 5 MISC: 11 refills | Status: DC

## 2023-11-10 MED ORDER — INSULIN ASPART 100 UNIT/ML IJ SOLN: INTRAMUSCULAR | 11 refills | Status: DC

## 2023-11-10 MED ORDER — OMNIPOD 5 DEXG7G6 INTRO GEN 5 KIT: 1.0000 | PACK | Freq: Every day | 0 refills | Status: DC

## 2023-11-10 MED ORDER — OMNIPOD 5 DEXG7G6 INTRO GEN 5 KIT
1.0000 | PACK | Freq: Every day | 0 refills | Status: DC
  Filled 2023-11-10: qty 1, 30d supply, fill #0

## 2023-11-10 NOTE — Telephone Encounter (Signed)
 Medication sent to pharmacy

## 2023-11-10 NOTE — Telephone Encounter (Signed)
 Rx sent back to [facility] pharmacy.

## 2023-11-10 NOTE — Progress Notes (Signed)
 Internal Medicine Clinic Attending  Case discussed with the resident at the time of the visit.  We reviewed the resident's history and exam and pertinent patient test results.  I agree with the assessment, diagnosis, and plan of care documented in the resident's note.

## 2023-11-10 NOTE — Telephone Encounter (Addendum)
 Patient her asking about getting the Omnipod Insulin  Pump.  Prescription was sent to the [facility].  Pods are unavailable at present .  Patient was informed that he will need to choose another Pharmacy..  Patient unable to choose a Pharmacy at this time. Spoke with D. Pyler Diabetes Educator about Pharmacies that carry the Omnipod.7 DexG7G6.  Was informed to send to [facility] .  Call to [facility] at [phone].  Spoke with Pharmacy representative.  Need to get an order for the supplies as the kit was only ordered.  Will also need dosing.  Will fax over request to be completed so that Omnipod and medications can be ordered.  Denied initial request.  Will send over information for possible PA.and request for supplies. Send now to [employer].

## 2023-11-14 ENCOUNTER — Telehealth: Payer: Self-pay | Admitting: Dietician

## 2023-11-14 NOTE — Telephone Encounter (Signed)
 Call to [employer]:Omnipod pods and  kit are not covered by [employer],  Medicaid should go to local pharmacy. They just sent to [id number]

## 2023-11-15 ENCOUNTER — Other Ambulatory Visit (HOSPITAL_COMMUNITY): Payer: Self-pay

## 2023-11-15 ENCOUNTER — Telehealth: Payer: Self-pay | Admitting: *Deleted

## 2023-11-15 ENCOUNTER — Other Ambulatory Visit: Payer: Self-pay | Admitting: Student

## 2023-11-15 ENCOUNTER — Ambulatory Visit (INDEPENDENT_AMBULATORY_CARE_PROVIDER_SITE_OTHER): Payer: MEDICAID | Admitting: Dietician

## 2023-11-15 ENCOUNTER — Telehealth: Payer: Self-pay | Admitting: Dietician

## 2023-11-15 DIAGNOSIS — E1065 Type 1 diabetes mellitus with hyperglycemia: Secondary | ICD-10-CM

## 2023-11-15 DIAGNOSIS — E109 Type 1 diabetes mellitus without complications: Secondary | ICD-10-CM

## 2023-11-15 DIAGNOSIS — E10649 Type 1 diabetes mellitus with hypoglycemia without coma: Secondary | ICD-10-CM

## 2023-11-15 LAB — GLUCOSE, CAPILLARY
Glucose-Capillary: 69 mg/dL — ABNORMAL LOW (ref 70–99)
Glucose-Capillary: 125 mg/dL — ABNORMAL HIGH (ref 70–99)

## 2023-11-15 MED ORDER — BD PEN NEEDLE NANO U/F 32G X 4 MM MISC
1.0000 | Freq: Three times a day (TID) | 0 refills | Status: DC
  Filled 2023-11-15: qty 100, 33d supply, fill #0
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
## 2023-11-15 NOTE — Telephone Encounter (Signed)
 Patient's grandmother here to see if she has the supplies needed for the patient's visit with D. Plyler, Diabetes Educator on today.  Spoke with [name] and informed grandmother that all supplies are good.  Only needs the Insulin  of which patient may have.  Patient not with the grandmother . May be on the way.  [name] to talk with patient and Grandmother about getting signed up and registered for the class they will need to attend.

## 2023-11-15 NOTE — Patient Instructions (Addendum)
 Good job registering your Omnipod insulin  pump controller today!  You also placed a Dexcom G7 sensor.  I will ask for Rxs for:  Accu chek Meter and supplies  Pen needles  Dexcom G7 sensors Botles of insulin  for your insulin  pump  I will call the Omnipod educator and see about setting up training here.   I will be in touch. I will call MaryAnn.  [name] [phone]

## 2023-11-16 ENCOUNTER — Other Ambulatory Visit (HOSPITAL_COMMUNITY): Payer: Self-pay

## 2023-11-16 ENCOUNTER — Other Ambulatory Visit: Payer: Self-pay

## 2023-11-16 MED ORDER — ACCU-CHEK GUIDE TEST VI STRP
ORAL_STRIP | Freq: Three times a day (TID) | 12 refills | Status: DC
  Filled 2023-11-16: qty 50, 16d supply, fill #0
  Filled 2024-01-03: qty 50, 16d supply, fill #1

## 2023-11-16 MED ORDER — DEXCOM G7 SENSOR MISC
11 refills | Status: DC
  Filled 2023-11-16: qty 3, 30d supply, fill #0
  Filled 2024-01-03: qty 3, 30d supply, fill #1

## 2023-11-16 MED ORDER — ACCU-CHEK GUIDE W/DEVICE KIT
PACK | 1 refills | Status: DC
  Filled 2023-11-16: qty 1, 30d supply, fill #0

## 2023-11-16 MED ORDER — ACCU-CHEK SOFTCLIX LANCETS MISC
Freq: Three times a day (TID) | 5 refills | Status: DC
  Filled 2023-11-16: qty 100, 30d supply, fill #0

## 2023-11-16 MED ORDER — INSULIN ASPART 100 UNIT/ML IJ SOLN
50.0000 [IU] | Freq: Every day | INTRAMUSCULAR | 11 refills | Status: DC
  Filled 2023-11-16 – 2023-11-22 (×2): qty 20, 40d supply, fill #0

## 2023-11-16 MED ORDER — BD PEN NEEDLE NANO 2ND GEN 32G X 4 MM MISC
Freq: Four times a day (QID) | 3 refills | Status: DC
  Filled 2023-11-16: qty 200, 50d supply, fill #0
  Filled 2023-12-17: qty 100, 25d supply, fill #0

## 2023-11-16 NOTE — Telephone Encounter (Signed)
 Call to patient's mother to let her know prescriptions should be ready for pick up.

## 2023-11-16 NOTE — Telephone Encounter (Signed)
 Patient needs vials of insulin  for insulin  pump, CGM sensors , pen needles, and meter and supplies as backup to CGM. They request these prescriptions to be sent to [facility].

## 2023-11-21 NOTE — Telephone Encounter (Signed)
 error

## 2023-11-22 ENCOUNTER — Telehealth: Payer: Self-pay | Admitting: Dietician

## 2023-11-22 ENCOUNTER — Other Ambulatory Visit: Payer: Self-pay

## 2023-11-22 ENCOUNTER — Other Ambulatory Visit (HOSPITAL_COMMUNITY): Payer: Self-pay

## 2023-11-22 ENCOUNTER — Other Ambulatory Visit (HOSPITAL_BASED_OUTPATIENT_CLINIC_OR_DEPARTMENT_OTHER): Payer: Self-pay

## 2023-11-22 NOTE — Telephone Encounter (Signed)
Spoke with insulin pump trainer , Junie Panning. Training set for 315 PM tomorrow. She asks for updated pump order if he will be using set insulin doses rather than carb counting.   Spoke with mother about what he needs for training on the Omnipod insulin pump, she does not think she has vials of Novolog.   Call to pharmacy about Novolog vials for insulin pump training tomorrow. They will figure out why they were on hold and mother should be able to pick the vials of Novolog up by later today. They will call her if they run into any problems.

## 2023-11-23 ENCOUNTER — Ambulatory Visit: Payer: MEDICAID | Admitting: Dietician

## 2023-11-23 NOTE — Progress Notes (Unsigned)
Mom will hold lantus   Starting with 125 ml per pod   Ask about his feet next week

## 2023-11-23 NOTE — Patient Instructions (Addendum)
You were trained on the Omnipod 5 insulin pump.   You will be using the controller to operate the pod.   You will use the receiver to know what your blood sugar is.  You do not take any Lantus while using the Omnipod insulin pump  Take short-acting before meals  Fill Omnipod with 125 ml of insulin every 3 days   Move pod site every 3 days   Don't administer insulin unless its been 4 hours since you've had insulin and your blood sugar is over 250  Pod will beep when you have 20 units left   Don't change pod before bed   If you are changing pod at night change 3-4 hours before bed   To change your pod you will need an new pod , alcohol swabs , and your omnipod controller  Bolus insulin is for your Bowl of food  [name] [phone]

## 2023-11-24 ENCOUNTER — Telehealth: Payer: Self-pay | Admitting: Dietician

## 2023-11-24 NOTE — Progress Notes (Signed)
Patient was trained to use the omnipod   Pump settings as of 11/23/23 Pod filled with of novolog  Max Basal Rate = 2 U/hr 12am-12am Basal rate = 0.65 U/hr 12am-8am Target Glucose : 120mg /dL correct above 150mg /dL 3TD-17OH Target Glucose : 110 mg/dL correct above 607 mg/dL Temp Basal Rate on  37TG-62IR Insulin to Carb ratio = 1g/unit  12am -12am Correction factor = 53 mg/dL/unit  Duration of insulin action - 4 hrs Max Bolus= 30 Units  Verbally told to [place] 7 units before meals   Jacob [name], Student-Dietician 11/24/2023 10:02 AM.

## 2023-11-24 NOTE — Progress Notes (Signed)
Jacob [name] was trained on how to fill, apply and use Omnipod 5 dex G7/G6 insulin pump by certified Insulin pump trainer today, [name], RN, CDCES [phone] ). I was present  at most of his training along with his mother and RD Intern [name]. He left the office with pod in place on right upper abdomen and Dexcom reading in the 300s. The trainer will follow up with support for the next week and he is scheduled for follow up in the office weekly for the next two weeks. Pump training documents to be scanned to chart.  [name], RD 11/24/2023 10:29 AM.

## 2023-11-24 NOTE — Telephone Encounter (Signed)
Mother days she has not heard from Olaf today. She was given the office after hours number for support.

## 2023-11-24 NOTE — Telephone Encounter (Signed)
Jacob [name] says his Blood sugar is high, staying in 300s, took insulin correction of 2.5 units Took 7 units before meals 3x today. Water mostly. Says pump says Insulin on board os 5.4 units. encouraged him to drink water, move as much as he can take a walk and if still high tomorrow to call the office.

## 2023-11-29 ENCOUNTER — Telehealth: Payer: Self-pay | Admitting: Dietician

## 2023-11-29 ENCOUNTER — Encounter: Payer: Self-pay | Admitting: Dietician

## 2023-11-29 NOTE — Telephone Encounter (Signed)
"  He was okay" per his mother. She did not know his blood sugars or if he had changed his pod. I made her aware that his pod may run out of insulin today and should be changed soon.  His mother thinks the insulin pump is too much for him. She scheduled an appointment for him to come in tomorrow (they missed his appointment today)

## 2023-11-30 ENCOUNTER — Inpatient Hospital Stay (HOSPITAL_COMMUNITY): Payer: MEDICAID

## 2023-11-30 ENCOUNTER — Inpatient Hospital Stay (HOSPITAL_COMMUNITY)
Admission: EM | Admit: 2023-11-30 | Discharge: 2023-12-01 | DRG: 638 | Disposition: A | Discharge status: Home / Self Care | Payer: MEDICAID | Attending: Family Medicine | Admitting: Family Medicine

## 2023-11-30 ENCOUNTER — Other Ambulatory Visit: Payer: Self-pay

## 2023-11-30 ENCOUNTER — Encounter: Payer: Self-pay | Admitting: Dietician

## 2023-11-30 ENCOUNTER — Encounter (HOSPITAL_COMMUNITY): Payer: Self-pay

## 2023-11-30 DIAGNOSIS — Z794 Long term (current) use of insulin: Secondary | ICD-10-CM

## 2023-11-30 DIAGNOSIS — E1110 Type 2 diabetes mellitus with ketoacidosis without coma: Secondary | ICD-10-CM | POA: Diagnosis present

## 2023-11-30 DIAGNOSIS — F259 Schizoaffective disorder, unspecified: Secondary | ICD-10-CM | POA: Diagnosis present

## 2023-11-30 DIAGNOSIS — J101 Influenza due to other identified influenza virus with other respiratory manifestations: Secondary | ICD-10-CM

## 2023-11-30 DIAGNOSIS — F319 Bipolar disorder, unspecified: Secondary | ICD-10-CM | POA: Diagnosis present

## 2023-11-30 DIAGNOSIS — Z9641 Presence of insulin pump (external) (internal): Secondary | ICD-10-CM | POA: Diagnosis present

## 2023-11-30 DIAGNOSIS — Z87891 Personal history of nicotine dependence: Secondary | ICD-10-CM

## 2023-11-30 DIAGNOSIS — E1010 Type 1 diabetes mellitus with ketoacidosis without coma: Secondary | ICD-10-CM | POA: Diagnosis present

## 2023-11-30 DIAGNOSIS — Z5900 Homelessness unspecified: Secondary | ICD-10-CM | POA: Diagnosis not present

## 2023-11-30 DIAGNOSIS — Z1152 Encounter for screening for COVID-19: Secondary | ICD-10-CM | POA: Diagnosis not present

## 2023-11-30 LAB — TROPONIN I (HIGH SENSITIVITY): Troponin I (High Sensitivity): 5 ng/L (ref <18)

## 2023-11-30 LAB — I-STAT VENOUS BLOOD GAS, ED
Acid-base deficit: 14 mmol/L — ABNORMAL HIGH (ref 0.0–2.0)
Bicarbonate: 12.7 mmol/L — ABNORMAL LOW (ref 20.0–28.0)
Calcium, Ion: 1.26 mmol/L (ref 1.15–1.40)
HCT: 53 % — ABNORMAL HIGH (ref 39.0–52.0)
Hemoglobin: 18 g/dL — ABNORMAL HIGH (ref 13.0–17.0)
O2 Saturation: 74 %
Potassium: 5.6 mmol/L — ABNORMAL HIGH (ref 3.5–5.1)
Sodium: 129 mmol/L — ABNORMAL LOW (ref 135–145)
TCO2: 14 mmol/L — ABNORMAL LOW (ref 22–32)
pCO2, Ven: 34 mm[Hg] — ABNORMAL LOW (ref 44–60)
pH, Ven: 7.182 — CL (ref 7.25–7.43)
pO2, Ven: 48 mm[Hg] — ABNORMAL HIGH (ref 32–45)

## 2023-11-30 LAB — URINALYSIS, ROUTINE W REFLEX MICROSCOPIC
Bacteria, UA: NONE SEEN
Bilirubin Urine: NEGATIVE
Glucose, UA: >=500 mg/dL — AB
Ketones, ur: 80 mg/dL — AB
Leukocytes,Ua: NEGATIVE
Nitrite: NEGATIVE
Protein, ur: 30 mg/dL — AB
Specific Gravity, Urine: 1.025 (ref 1.005–1.030)
pH: 5 (ref 5.0–8.0)

## 2023-11-30 LAB — COMPREHENSIVE METABOLIC PANEL
ALT: 46 U/L — ABNORMAL HIGH (ref 0–44)
AST: 21 U/L (ref 15–41)
Albumin: 4.4 g/dL (ref 3.5–5.0)
Alkaline Phosphatase: 84 U/L (ref 38–126)
Anion gap: 22 — ABNORMAL HIGH (ref 5–15)
BUN: 18 mg/dL (ref 6–20)
CO2: 13 mmol/L — ABNORMAL LOW (ref 22–32)
Calcium: 10.1 mg/dL (ref 8.9–10.3)
Chloride: 101 mmol/L (ref 98–111)
Creatinine, Ser: 1.34 mg/dL — ABNORMAL HIGH (ref 0.61–1.24)
GFR, Estimated: >60 mL/min (ref >60)
Glucose, Bld: 226 mg/dL — ABNORMAL HIGH (ref 70–99)
Potassium: 4.8 mmol/L (ref 3.5–5.1)
Sodium: 136 mmol/L (ref 135–145)
Total Bilirubin: 1.9 mg/dL — ABNORMAL HIGH (ref 0.0–1.2)
Total Protein: 7.4 g/dL (ref 6.5–8.1)

## 2023-11-30 LAB — CBC WITH DIFFERENTIAL/PLATELET
Abs Immature Granulocytes: 0.03 10*3/uL (ref 0.00–0.07)
Basophils Absolute: 0.1 10*3/uL (ref 0.0–0.1)
Basophils Relative: 1 %
Eosinophils Absolute: 0 10*3/uL (ref 0.0–0.5)
Eosinophils Relative: 0 %
HCT: 49.5 % (ref 39.0–52.0)
Hemoglobin: 16.7 g/dL (ref 13.0–17.0)
Immature Granulocytes: 0 %
Lymphocytes Relative: 14 %
Lymphs Abs: 1.2 10*3/uL (ref 0.7–4.0)
MCH: 29.6 pg (ref 26.0–34.0)
MCHC: 33.7 g/dL (ref 30.0–36.0)
MCV: 87.8 fL (ref 80.0–100.0)
Monocytes Absolute: 0.6 10*3/uL (ref 0.1–1.0)
Monocytes Relative: 7 %
Neutro Abs: 6.5 10*3/uL (ref 1.7–7.7)
Neutrophils Relative %: 78 %
Platelets: 405 10*3/uL — ABNORMAL HIGH (ref 150–400)
RBC: 5.64 MIL/uL (ref 4.22–5.81)
RDW: 11.9 % (ref 11.5–15.5)
WBC: 8.3 10*3/uL (ref 4.0–10.5)
nRBC: 0 % (ref 0.0–0.2)

## 2023-11-30 LAB — CBC
HCT: 45.6 % (ref 39.0–52.0)
Hemoglobin: 15.6 g/dL (ref 13.0–17.0)
MCH: 29.8 pg (ref 26.0–34.0)
MCHC: 34.2 g/dL (ref 30.0–36.0)
MCV: 87 fL (ref 80.0–100.0)
Platelets: 361 10*3/uL (ref 150–400)
RBC: 5.24 MIL/uL (ref 4.22–5.81)
RDW: 11.9 % (ref 11.5–15.5)
WBC: 12.8 10*3/uL — ABNORMAL HIGH (ref 4.0–10.5)
nRBC: 0 % (ref 0.0–0.2)

## 2023-11-30 LAB — CBG MONITORING, ED
Glucose-Capillary: 599 mg/dL (ref 70–99)
Glucose-Capillary: 549 mg/dL (ref 70–99)
Glucose-Capillary: 281 mg/dL — ABNORMAL HIGH (ref 70–99)
Glucose-Capillary: 277 mg/dL — ABNORMAL HIGH (ref 70–99)
Glucose-Capillary: 207 mg/dL — ABNORMAL HIGH (ref 70–99)
Glucose-Capillary: 171 mg/dL — ABNORMAL HIGH (ref 70–99)
Glucose-Capillary: 147 mg/dL — ABNORMAL HIGH (ref 70–99)
Glucose-Capillary: 177 mg/dL — ABNORMAL HIGH (ref 70–99)
Glucose-Capillary: 166 mg/dL — ABNORMAL HIGH (ref 70–99)
Glucose-Capillary: 161 mg/dL — ABNORMAL HIGH (ref 70–99)
Glucose-Capillary: 171 mg/dL — ABNORMAL HIGH (ref 70–99)

## 2023-11-30 LAB — RESP PANEL BY RT-PCR (RSV, FLU A&B, COVID)  RVPGX2
Influenza A by PCR: POSITIVE — AB
Influenza B by PCR: NEGATIVE
Resp Syncytial Virus by PCR: NEGATIVE
SARS Coronavirus 2 by RT PCR: NEGATIVE

## 2023-11-30 LAB — BASIC METABOLIC PANEL
Anion gap: 14 (ref 5–15)
BUN: 14 mg/dL (ref 6–20)
CO2: 20 mmol/L — ABNORMAL LOW (ref 22–32)
Calcium: 9.5 mg/dL (ref 8.9–10.3)
Chloride: 101 mmol/L (ref 98–111)
Creatinine, Ser: 1.04 mg/dL (ref 0.61–1.24)
GFR, Estimated: >60 mL/min (ref >60)
Glucose, Bld: 180 mg/dL — ABNORMAL HIGH (ref 70–99)
Potassium: 4.1 mmol/L (ref 3.5–5.1)
Sodium: 135 mmol/L (ref 135–145)

## 2023-11-30 LAB — I-STAT CHEM 8, ED
BUN: 20 mg/dL (ref 6–20)
Calcium, Ion: 1.3 mmol/L (ref 1.15–1.40)
Chloride: 108 mmol/L (ref 98–111)
Creatinine, Ser: 0.9 mg/dL (ref 0.61–1.24)
Glucose, Bld: 264 mg/dL — ABNORMAL HIGH (ref 70–99)
HCT: 47 % (ref 39.0–52.0)
Hemoglobin: 16 g/dL (ref 13.0–17.0)
Potassium: 4.9 mmol/L (ref 3.5–5.1)
Sodium: 135 mmol/L (ref 135–145)
TCO2: 14 mmol/L — ABNORMAL LOW (ref 22–32)

## 2023-11-30 LAB — RAPID URINE DRUG SCREEN, HOSP PERFORMED
Amphetamines: NOT DETECTED
Barbiturates: NOT DETECTED
Benzodiazepines: NOT DETECTED
Cocaine: NOT DETECTED
Opiates: NOT DETECTED
Tetrahydrocannabinol: POSITIVE — AB

## 2023-11-30 LAB — BETA-HYDROXYBUTYRIC ACID: Beta-Hydroxybutyric Acid: >8 mmol/L — ABNORMAL HIGH (ref 0.05–0.27)

## 2023-11-30 LAB — LIPASE, BLOOD: Lipase: 18 U/L (ref 11–51)

## 2023-11-30 MED ORDER — INSULIN NPH (HUMAN) (ISOPHANE) 100 UNIT/ML ~~LOC~~ SUSP
8.0000 [IU] | Freq: Three times a day (TID) | SUBCUTANEOUS | Status: DC
Start: 2023-11-30 — End: 2023-11-30
  Filled 2023-11-30: qty 10

## 2023-11-30 MED ORDER — ACETAMINOPHEN 650 MG RE SUPP
650.0000 mg | Freq: Four times a day (QID) | RECTAL | Status: DC | PRN
Start: 2023-11-30 — End: 2023-12-01

## 2023-11-30 MED ORDER — DEXTROSE IN LACTATED RINGERS 5 % IV SOLN: INTRAVENOUS | Status: DC

## 2023-11-30 MED ORDER — LACTATED RINGERS IV BOLUS
2000.0000 mL | Freq: Once | INTRAVENOUS | Status: AC
  Administered 2023-11-30: 2000 mL via INTRAVENOUS

## 2023-11-30 MED ORDER — INSULIN REGULAR(HUMAN) IN NACL 100-0.9 UT/100ML-% IV SOLN
INTRAVENOUS | Status: DC
  Administered 2023-11-30: 9 [IU]/h via INTRAVENOUS
  Filled 2023-11-30: qty 100

## 2023-11-30 MED ORDER — DEXTROSE 50 % IV SOLN
0.0000 mL | INTRAVENOUS | Status: DC | PRN
Start: 2023-11-30 — End: 2023-12-01

## 2023-11-30 MED ORDER — ENOXAPARIN SODIUM 40 MG/0.4ML IJ SOSY
40.0000 mg | PREFILLED_SYRINGE | INTRAMUSCULAR | Status: DC
  Administered 2023-12-01: 40 mg via SUBCUTANEOUS
  Filled 2023-11-30: qty 0.4

## 2023-11-30 MED ORDER — SODIUM CHLORIDE 0.9% FLUSH: 3.0000 mL | Freq: Two times a day (BID) | INTRAVENOUS | Status: DC

## 2023-11-30 MED ORDER — ACETAMINOPHEN 325 MG PO TABS: 650.0000 mg | ORAL_TABLET | Freq: Four times a day (QID) | ORAL | Status: DC | PRN

## 2023-11-30 MED ORDER — LACTATED RINGERS IV SOLN: INTRAVENOUS | Status: AC

## 2023-11-30 MED ORDER — ONDANSETRON 4 MG PO TBDP
4.0000 mg | ORAL_TABLET | Freq: Once | ORAL | Status: AC | PRN
  Administered 2023-11-30: 4 mg via ORAL
  Filled 2023-11-30: qty 1

## 2023-11-30 MED ORDER — POLYETHYLENE GLYCOL 3350 17 G PO PACK: 17.0000 g | PACK | Freq: Every day | ORAL | Status: DC | PRN

## 2023-11-30 NOTE — Progress Notes (Signed)
Patient's nurse reported that BMP resulted showing bicarb 20 and anion gap 14.  Requesting for consideration to switch to subcu insulin.  Per chart review patient has been admitted for DKA in the setting of type 1 diabetes.  Currently on insulin drip. -Plan to continue insulin drip as of now.  Plan to continue to check BMP every 4 hours.  Once bicarb level will be normalized for consecutive 2 BMP in that case will bridge insulin drip with subcutaneous insulin and gradually wean off the insulin drip in 2 hours.  I have informed the patient nurse regarding this plan.  Tereasa Coop, MD [facility] 11/30/2023, 9:16 PM     Addendum - BMP at 12:30 AM showing bicarb 23 and anion gap is 11.  As bicarb level has been improved and anion gap is closed planning to start subcu insulin Lantus 24 unit to bridge with insulin drip.  After giving the subcu insulin need to stop the insulin drip after 2 hours.  Continue to check POC blood glucose every 2 hours and BMP every 4 hours. -Per chart review when patient was discharged from the hospital 12/23 he was discharged with Lantus 24 units, NovoLog 7 units 3 times daily with meal. -Planning to start Lantus 24 units, NovoLog 7 units 3 times daily with meals and low sliding scale SSI as needed coverage.  Starting clear liquid diet.  Tereasa Coop, MD [facility] 12/01/2023, 1:13 AM     Addendum - Overnight patient's DKA has been resolved and transition to long-acting subcu insulin 24 units, with NovoLog 7 units 3 times daily with meals scheduled, low sliding scale insulin as needed at bedtime insulin as needed coverage. Transition clear liquid diet to carb modified diet. Give patient is off insulin drip changing bed request progressive unit to medical telemetry unit.   Tereasa Coop, MD [facility] 12/01/2023, 4:18 AM

## 2023-11-30 NOTE — Telephone Encounter (Addendum)
Called mother per her request to remind her of Jacob [name]'s appointment this afternoon. Patient was with her. He says he has been throwing up, going to the emergency room/urgent care now. He was too upset to tell me what his blood sugars are and when he last changed his pod or took insulin. Mother says he has a phone now and he can call out but it is not receiving calls. Mother to call later to update [place] and let us know if they can make his pump follow up appointment this afternoon.

## 2023-11-30 NOTE — ED Triage Notes (Signed)
Patient reports ate a cold hotdog at [employer] and is now having uncontrolled vomiting.  Patient is diabetic.  Denies fever or sick exposure.  Patient appears emaciated.

## 2023-11-30 NOTE — Inpatient Diabetes Management (Signed)
Inpatient Diabetes Program Recommendations  AACE/ADA: New Consensus Statement on Inpatient Glycemic Control (2015)  Target Ranges:  Prepandial:   less than 140 mg/dL      Peak postprandial:   less than 180 mg/dL (1-2 hours)      Critically ill patients:  140 - 180 mg/dL   Lab Results  Component Value Date   GLUCAP 549 (HH) 11/30/2023   HGBA1C 11.4 (H) 10/23/2023    Review of Glycemic Control  Latest Reference Range & Units 11/30/23 09:55 11/30/23 11:44  Glucose-Capillary 70 - 99 mg/dL 782 (HH) 956 (HH)   Diabetes history: DM 1 Outpatient Diabetes medications:  Newly started on Omnipod: Max Basal Rate = 2 U/hr 12am-12am Basal rate = 0.65 U/hr 12am-8am Target Glucose : 120mg /dL correct above 150mg /dL [id number]-08MV Target Glucose : 110 mg/dL correct above 784 mg/dL Temp Basal Rate on  69GE-95MW Insulin to Carb ratio = 1g/unit  12am -12am Correction factor = 53 mg/dL/unit  Duration of insulin action - 4 hrs Max Bolus= 30 Units Current orders for Inpatient glycemic control:  IV insulin for DKA  Inpatient Diabetes Program Recommendations:    Note that patient newly started on Omnipod insulin pump on 1/22.  Per notes from [facility], mother changed pod last night at [employer]  They do not have controller or reader for pump/CGM with them at the hospital.  Agree with IV insulin- It will be up to MD if they want to resume pump therapy for transition?   Thanks,  Lorenza Cambridge, RN, BC-ADM Inpatient Diabetes Coordinator Pager [phone]  (8a-5p)

## 2023-11-30 NOTE — ED Provider Triage Note (Signed)
Emergency Medicine Provider Triage Evaluation Note  Jacob [name] , a 33 y.o. male  was evaluated in triage.  Pt complains of vomiting starting this AM. Hx of diabetes, has continued glucose monitor but doesn'[name] think it's working.   Review of Systems  Positive: Nausea, vomiting Negative: Abd pain, diarrhea  Physical Exam  BP (!) 150/102   Pulse (!) 107   Temp 98 F (36.7 C) (Oral)   Resp 17   Ht 5\' 9"  (1.753 m)   Wt 61.7 kg   SpO2 100%   BMI 20.08 kg/m  Gen:   Awake, no distress   Resp:  Normal effort  MSK:   Moves extremities without difficulty  Other:    Medical Decision Making  Medically screening exam initiated at 9:59 AM.  Appropriate orders placed.  Jacob [name] was informed that the remainder of the evaluation will be completed by another provider, this initial triage assessment does not replace that evaluation, and the importance of remaining in the ED until their evaluation is complete.  CBG 599. Will add DKA labs including VBG and beta hydroxy   Jacob Mattix T, [name] 11/30/23 1001

## 2023-11-30 NOTE — Telephone Encounter (Signed)
Spoke with Jacob [name]'s mother who states she went to [employer] last night and  [place] changed his pod. They do not have his Omnipod pump controller or his Dexcom CGM receiver with them in the emergency room therefore am unable to know what his blood sugars have been or how much insulin he has been getting.

## 2023-11-30 NOTE — Assessment & Plan Note (Signed)
Patient looking nontoxic, afebrile, no hypoxia.  Check chest x-ray.  Patient did present with vomiting, which may be due to influenza A.  Will check an abdominal x-ray as well.  Currently vomiting seems to have resolved.  Patient has reasonable appetite.

## 2023-11-30 NOTE — Assessment & Plan Note (Signed)
Patient with chronic type 1 diabetes mellitus recently started on insulin pump as an outpatient.  It cannot be ascertained however patient has been adherent to it.  Apparently it was beeping, although further details are not available.  At this time pump has been removed.  Patient has been started on insulin infusion.  As well as LR infusion.  At this time we are awaiting anion gap to come back.  We will check the BMP every 6 hourly after that. I will start the patient on bassal insulin right now with NPH 8 unit Q8hourly (based on discharge summary 10/24/2023 showing lantus use at 24 units daily). Will have to discuss with patinet/family if he is ok with resuming pump and reporting any malfunction promptly.

## 2023-11-30 NOTE — ED Notes (Signed)
Prompted by endotool to contact MD to consider transition orders. MD contacted and states to continue insulin drip and will reassess after 0100 BMP.

## 2023-11-30 NOTE — ED Provider Notes (Signed)
[facility] AT [facility] Provider Note   CSN: [id number] Arrival date & time: 11/30/23  9528     History  Chief Complaint  Patient presents with   Emesis    Jacob [name] is a 33 y.o. male.  Patient here with nausea and vomiting.  Insulin-dependent diabetes.  Has an insulin pump that he states that he has been compliant with.  He states that he ate suspicious hot dog last night.  History of schizophrenia homelessness.  He ate a cold hotdog last night and woke up with nausea and vomiting.  He denies any fever chills abdominal pain cough or sputum production.  He is not sure if his glucose monitor/insulin pump is working.  Overall denies any chest pain.  The history is provided by the patient.       Home Medications Prior to Admission medications   Medication Sig Start Date End Date Taking? Authorizing Provider  Accu-Chek Softclix Lancets lancets Use up to 3 strips a day when not wearing Continuous glucose monitor 11/16/23   Katheran James, DO  Blood Glucose Monitoring Suppl (ACCU-CHEK GUIDE) w/Device KIT Use when not wearing Continuous glucose monitor 11/16/23   Katheran James, DO  Continuous Glucose Receiver (DEXCOM G7 RECEIVER) DEVI Use with Dexcom G7 sensors to continuously monitor blood sugar. Patient not taking: Reported on 10/02/2023 08/22/23   Katheran James, DO  Continuous Glucose Sensor (DEXCOM G7 SENSOR) MISC Place new sensor every 10 days. Use to continuously monitor blood sugar. 11/16/23   Katheran James, DO  Continuous Glucose Transmitter (DEXCOM G6 TRANSMITTER) MISC USE AS DIRECTED FOR CONTINUOUS GLUCOSE MONITORING. USE TRANSMITTER FOR 90 DAYS. DISCARD AND REPLACE. Patient not taking: Reported on 10/02/2023 08/05/23   [name], MD  glucagon 1 MG injection Inject 1 mg into the skin See admin instructions. Follow package directions for low blood sugar. Patient not taking: Reported on 10/23/2023 06/10/22 09/01/23  Steffanie Rainwater, MD  glucose blood (ACCU-CHEK GUIDE TEST) test strip Use up to 3 strips a day when not wearing Continuous glucose monitor 11/16/23   Katheran James, DO  insulin aspart (NOVOLOG FLEXPEN) 100 UNIT/ML FlexPen Inject 7 units subcutaneously before breakfast, lunch and dinner 10/24/23   Morrie Sheldon, MD  insulin aspart (NOVOLOG) 100 UNIT/ML injection Use with insulin pump to administer up to 50 units daily 11/16/23   Katheran James, DO  Insulin Disposable Pump (OMNIPOD 5 DEXG7G6 PODS GEN 5) MISC Use to administer up to 50 units, change pods every 2 days 11/10/23   [name], MD  insulin glargine (LANTUS) 100 UNIT/ML Solostar Pen Inject 24 units into the skin daily. 11/08/23 11/07/24  [name], MD  Insulin Pen Needle (BD PEN NEEDLE NANO 2ND GEN) 32G X 4 MM MISC Use to inject insulin up to 4 times a day 11/16/23   Katheran James, DO  Insulin Pen Needle (BD PEN NEEDLE NANO U/F) 32G X 4 MM MISC Use  3  times daily with insulin. 11/15/23   Chauncey Mann, DO  Lancet Device MISC 1 each by Does not apply route 3 (three) times daily. May dispense any manufacturer covered by patient's insurance. 10/24/23   Morrie Sheldon, MD  paliperidone (INVEGA) 6 MG 24 hr tablet Take 6 mg by mouth daily. 10/11/23   [provider]      Allergies    Patient has no known allergies.    Review of Systems   Review of Systems  Physical Exam Updated Vital Signs BP Marland Kitchen)  150/102   Pulse (!) 107   Temp 98 F (36.7 C) (Oral)   Resp 17   Ht 5\' 9"  (1.753 m)   Wt 61.7 kg   SpO2 100%   BMI 20.08 kg/m  Physical Exam Vitals and nursing note reviewed.  Constitutional:      General: He is not in acute distress.    Appearance: He is well-developed. He is ill-appearing.  HENT:     Head: Normocephalic and atraumatic.     Nose: Nose normal.     Mouth/Throat:     Mouth: Mucous membranes are dry.  Eyes:     Extraocular Movements: Extraocular movements intact.     Conjunctiva/sclera: Conjunctivae  normal.     Pupils: Pupils are equal, round, and reactive to light.  Cardiovascular:     Rate and Rhythm: Normal rate and regular rhythm.     Pulses: Normal pulses.     Heart sounds: Normal heart sounds. No murmur heard. Pulmonary:     Effort: Pulmonary effort is normal. No respiratory distress.     Breath sounds: Normal breath sounds.  Abdominal:     Palpations: Abdomen is soft.     Tenderness: There is no abdominal tenderness.  Musculoskeletal:        General: No swelling.     Cervical back: Normal range of motion and neck supple.  Skin:    General: Skin is warm and dry.     Capillary Refill: Capillary refill takes less than 2 seconds.  Neurological:     General: No focal deficit present.     Mental Status: He is alert.  Psychiatric:        Mood and Affect: Mood normal.     ED Results / Procedures / Treatments   Labs (all labs ordered are listed, but only abnormal results are displayed) Labs Reviewed  URINALYSIS, ROUTINE W REFLEX MICROSCOPIC - Abnormal; Notable for the following components:      Result Value   Color, Urine STRAW (*)    Glucose, UA >=500 (*)    Hgb urine dipstick SMALL (*)    Ketones, ur 80 (*)    Protein, ur 30 (*)    All other components within normal limits  CBC WITH DIFFERENTIAL/PLATELET - Abnormal; Notable for the following components:   Platelets 405 (*)    All other components within normal limits  CBG MONITORING, ED - Abnormal; Notable for the following components:   Glucose-Capillary 599 (*)    All other components within normal limits  I-STAT VENOUS BLOOD GAS, ED - Abnormal; Notable for the following components:   pH, Ven 7.182 (*)    pCO2, Ven 34.0 (*)    pO2, Ven 48 (*)    Bicarbonate 12.7 (*)    TCO2 14 (*)    Acid-base deficit 14.0 (*)    Sodium 129 (*)    Potassium 5.6 (*)    HCT 53.0 (*)    Hemoglobin 18.0 (*)    All other components within normal limits  RESP PANEL BY RT-PCR (RSV, FLU A&B, COVID)  RVPGX2  BETA-HYDROXYBUTYRIC  ACID  COMPREHENSIVE METABOLIC PANEL  LIPASE, BLOOD    EKG None  Radiology No results found.  Procedures .Critical Care  Performed by: Virgina Norfolk, DO Authorized by: Virgina Norfolk, DO   Critical care provider statement:    Critical care time (minutes):  35   Critical care was necessary to treat or prevent imminent or life-threatening deterioration of the following conditions:  Metabolic crisis  Critical care was time spent personally by me on the following activities:  Blood draw for specimens, development of treatment plan with patient or surrogate, discussions with primary provider, evaluation of patient's response to treatment, examination of patient, obtaining history from patient or surrogate, ordering and performing treatments and interventions, ordering and review of radiographic studies, pulse oximetry, re-evaluation of patient's condition, ordering and review of laboratory studies and review of old charts   Care discussed with: admitting provider       Medications Ordered in ED Medications  lactated ringers bolus 2,000 mL (has no administration in time range)  lactated ringers infusion (has no administration in time range)  dextrose 5 % in lactated ringers infusion (has no administration in time range)  dextrose 50 % solution 0-50 mL (has no administration in time range)  insulin regular, human (MYXREDLIN) 100 units/ 100 mL infusion (has no administration in time range)  ondansetron (ZOFRAN-ODT) disintegrating tablet 4 mg (4 mg Oral Given 11/30/23 1003)    ED Course/ Medical Decision Making/ A&P                                 Medical Decision Making Amount and/or Complexity of Data Reviewed Labs: ordered.  Risk Prescription drug management.   Buddie Nylen is here with nausea and vomiting.  History of insulin-dependent diabetes.  Unremarkable vitals.  No fever.  Uses insulin pump/monitor but not sure if it is working well.  He had blood sugar greater than 600  in triage.  DKA workup was initiated in triage and pH is back with a pH is 7.18 and a bicarb of 12.  Overall I suspect that he is in DKA likely due to medication issues given his homelessness schizophrenia.  He does state he has been having nausea and vomiting overnight but he attributes that to food illness from a hot dog last night at shelter.  Overall he denies any fevers or chills.  He is thin and cachectic but he has unremarkable vitals.  It appears that he is in DKA.  Potassium is 5.6 on blood gas as well.  Will start 2 L lactated ringer bolus, insulin per DKA protocol.  He is mentating well.  He has no significant leukocytosis or anemia.  OmniPod removed.  Overall to be admitted for further DKA care to medicine team.  This chart was dictated using voice recognition software.  Despite best efforts to proofread,  errors can occur which can change the documentation meaning.         Final Clinical Impression(s) / ED Diagnoses Final diagnoses:  Diabetic ketoacidosis without coma associated with type 1 diabetes mellitus [facility])    Rx / DC Orders ED Discharge Orders     None         Virgina Norfolk, DO 11/30/23 1109

## 2023-11-30 NOTE — ED Notes (Signed)
Per lab, light green tube (cmp and lipase) has hemolyzed for a third time; lab suggests putting in gold top; phlebotomy notified

## 2023-11-30 NOTE — H&P (Addendum)
History and Physical    Patient: Jacob [name] ZOX:[id number] DOB: 11/06/1990 DOA: 11/30/2023 DOS: the patient was seen and examined on 11/30/2023 PCP: [name], MD  Patient coming from: Homeless  Chief Complaint:  Chief Complaint  Patient presents with   Emesis   HPI: Jacob [name] is a 33 y.o. male with medical history significant of DM1, started insulin pump about 7 days ago.  Unfortunately patient is very nonspecific with me with regards to how his insulin pump has been doing.  At 1 point he said that it had been beeping.  But then refused to answer if it was beeping yesterday or the day before or what time.  Patient simply said that he sleeps like a log and therefore does not know what been going on.   Regardless patient seems to have been feeling reasonably well till this morning.  Patient reports having had a cold hot dog that made him nauseous and throw up 3 times.  Patient denies any diarrhea abdominal pain fever rigors.  This prompted the patient to come to the ER.  At this time patient reports that he is feeling totally fine and totally asymptomatic.  CBC in the ER is normal, comprehensive metabolic panel is pending.  Patient is noted to have a venous pH of 7.182 with bicarb of 12.7.  Anion gap is pending. Elevated Betahydroxybutyrate.  Review of Systems: As mentioned in the history of present illness. All other systems reviewed and are negative. Past Medical History:  Diagnosis Date   Bipolar 1 disorder (HCC)    History of attempted suicide 2019   Unsuccessful suicide attempt in 2019 with rat poison   Schizoaffective disorder (HCC)    Type 1 diabetes mellitus on insulin therapy (HCC) 2019   History reviewed. No pertinent surgical history. Social History:  reports that he has quit smoking. His smoking use included cigars and cigarettes. He has never used smokeless tobacco. He reports current alcohol use. He reports that he does not currently use drugs after having used the  following drugs: Marijuana.  No Known Allergies  Family History  Problem Relation Age of Onset   Healthy Mother    Healthy Father     Prior to Admission medications   Medication Sig Start Date End Date Taking? Authorizing Provider  Accu-Chek Softclix Lancets lancets Use up to 3 strips a day when not wearing Continuous glucose monitor 11/16/23   Katheran James, DO  Blood Glucose Monitoring Suppl (ACCU-CHEK GUIDE) w/Device KIT Use when not wearing Continuous glucose monitor 11/16/23   Katheran James, DO  Continuous Glucose Receiver (DEXCOM G7 RECEIVER) DEVI Use with Dexcom G7 sensors to continuously monitor blood sugar. Patient not taking: Reported on 10/02/2023 08/22/23   Katheran James, DO  Continuous Glucose Sensor (DEXCOM G7 SENSOR) MISC Place new sensor every 10 days. Use to continuously monitor blood sugar. 11/16/23   Katheran James, DO  Continuous Glucose Transmitter (DEXCOM G6 TRANSMITTER) MISC USE AS DIRECTED FOR CONTINUOUS GLUCOSE MONITORING. USE TRANSMITTER FOR 90 DAYS. DISCARD AND REPLACE. Patient not taking: Reported on 10/02/2023 08/05/23   [name], MD  glucagon 1 MG injection Inject 1 mg into the skin See admin instructions. Follow package directions for low blood sugar. Patient not taking: Reported on 10/23/2023 06/10/22 09/01/23  Steffanie Rainwater, MD  glucose blood (ACCU-CHEK GUIDE TEST) test strip Use up to 3 strips a day when not wearing Continuous glucose monitor 11/16/23   Katheran James, DO  insulin aspart (NOVOLOG FLEXPEN) 100 UNIT/ML FlexPen Inject  7 units subcutaneously before breakfast, lunch and dinner 10/24/23   Morrie Sheldon, MD  insulin aspart (NOVOLOG) 100 UNIT/ML injection Use with insulin pump to administer up to 50 units daily 11/16/23   Katheran James, DO  Insulin Disposable Pump (OMNIPOD 5 DEXG7G6 PODS GEN 5) MISC Use to administer up to 50 units, change pods every 2 days 11/10/23   [name], MD  insulin glargine (LANTUS) 100  UNIT/ML Solostar Pen Inject 24 units into the skin daily. 11/08/23 11/07/24  [name], MD  Insulin Pen Needle (BD PEN NEEDLE NANO 2ND GEN) 32G X 4 MM MISC Use to inject insulin up to 4 times a day 11/16/23   Katheran James, DO  Insulin Pen Needle (BD PEN NEEDLE NANO U/F) 32G X 4 MM MISC Use  3  times daily with insulin. 11/15/23   Chauncey Mann, DO  Lancet Device MISC 1 each by Does not apply route 3 (three) times daily. May dispense any manufacturer covered by patient's insurance. 10/24/23   Morrie Sheldon, MD  paliperidone (INVEGA) 6 MG 24 hr tablet Take 6 mg by mouth daily. 10/11/23   [provider]    Physical Exam: Vitals:   11/30/23 0953 11/30/23 1200 11/30/23 1230 11/30/23 1330  BP:  128/88 (!) 149/90 131/83  Pulse:  90 97 96  Resp:  14 15 15   Temp:      TempSrc:      SpO2:  100% 100% 100%  Weight: 61.7 kg     Height: 5\' 9"  (1.753 m)      General: Patient appeared restful no distress.  Interacting pleasantly.  Very nonspecific with his answers.  However I do not think patient's mentation is acutely altered. Respiratory exam: Bilateral intravesicular Cardiovascular exam S1-S2 normal Patient is a thin built gentleman Abdomen soft nontender Extremities warm without edema without any focal deficit. Data Reviewed:  Labs on Admission:  Results for orders placed or performed during the hospital encounter of 11/30/23 (from the past 24 hours)  Urinalysis, Routine w reflex microscopic -Urine, Clean Catch     Status: Abnormal   Collection Time: 11/30/23  9:55 AM  Result Value Ref Range   Color, Urine STRAW (A) YELLOW   APPearance CLEAR CLEAR   Specific Gravity, Urine 1.025 1.005 - 1.030   pH 5.0 5.0 - 8.0   Glucose, UA >=500 (A) NEGATIVE mg/dL   Hgb urine dipstick SMALL (A) NEGATIVE   Bilirubin Urine NEGATIVE NEGATIVE   Ketones, ur 80 (A) NEGATIVE mg/dL   Protein, ur 30 (A) NEGATIVE mg/dL   Nitrite NEGATIVE NEGATIVE   Leukocytes,Ua NEGATIVE NEGATIVE   RBC / HPF  0-5 0 - 5 RBC/hpf   WBC, UA 0-5 0 - 5 WBC/hpf   Bacteria, UA NONE SEEN NONE SEEN   Squamous Epithelial / HPF 0-5 0 - 5 /HPF   Mucus PRESENT   POC CBG, ED     Status: Abnormal   Collection Time: 11/30/23  9:55 AM  Result Value Ref Range   Glucose-Capillary 599 (HH) 70 - 99 mg/dL   Comment 1 Document in Chart   Resp panel by RT-PCR (RSV, Flu A&B, Covid) Anterior Nasal Swab     Status: Abnormal   Collection Time: 11/30/23  9:55 AM   Specimen: Anterior Nasal Swab  Result Value Ref Range   SARS Coronavirus 2 by RT PCR NEGATIVE NEGATIVE   Influenza A by PCR POSITIVE (A) NEGATIVE   Influenza B by PCR NEGATIVE NEGATIVE   Resp Syncytial  Virus by PCR NEGATIVE NEGATIVE  CBC with Differential     Status: Abnormal   Collection Time: 11/30/23  9:58 AM  Result Value Ref Range   WBC 8.3 4.0 - 10.5 K/uL   RBC 5.64 4.22 - 5.81 MIL/uL   Hemoglobin 16.7 13.0 - 17.0 g/dL   HCT 16.1 09.6 - 04.5 %   MCV 87.8 80.0 - 100.0 fL   MCH 29.6 26.0 - 34.0 pg   MCHC 33.7 30.0 - 36.0 g/dL   RDW 40.9 81.1 - 91.4 %   Platelets 405 (H) 150 - 400 K/uL   nRBC 0.0 0.0 - 0.2 %   Neutrophils Relative % 78 %   Neutro Abs 6.5 1.7 - 7.7 K/uL   Lymphocytes Relative 14 %   Lymphs Abs 1.2 0.7 - 4.0 K/uL   Monocytes Relative 7 %   Monocytes Absolute 0.6 0.1 - 1.0 K/uL   Eosinophils Relative 0 %   Eosinophils Absolute 0.0 0.0 - 0.5 K/uL   Basophils Relative 1 %   Basophils Absolute 0.1 0.0 - 0.1 K/uL   Immature Granulocytes 0 %   Abs Immature Granulocytes 0.03 0.00 - 0.07 K/uL  I-Stat venous blood gas, ([facility], [facility], [facility])     Status: Abnormal   Collection Time: 11/30/23 10:12 AM  Result Value Ref Range   pH, Ven 7.182 (LL) 7.25 - 7.43   pCO2, Ven 34.0 (L) 44 - 60 mmHg   pO2, Ven 48 (H) 32 - 45 mmHg   Bicarbonate 12.7 (L) 20.0 - 28.0 mmol/L   TCO2 14 (L) 22 - 32 mmol/L   O2 Saturation 74 %   Acid-base deficit 14.0 (H) 0.0 - 2.0 mmol/L   Sodium 129 (L) 135 - 145 mmol/L   Potassium 5.6 (H) 3.5 - 5.1 mmol/L    Calcium, Ion 1.26 1.15 - 1.40 mmol/L   HCT 53.0 (H) 39.0 - 52.0 %   Hemoglobin 18.0 (H) 13.0 - 17.0 g/dL   Sample type VENOUS    Comment NOTIFIED PHYSICIAN   Beta-hydroxybutyric acid     Status: Abnormal   Collection Time: 11/30/23 11:25 AM  Result Value Ref Range   Beta-Hydroxybutyric Acid >8.00 (H) 0.05 - 0.27 mmol/L  CBG monitoring, ED     Status: Abnormal   Collection Time: 11/30/23 11:44 AM  Result Value Ref Range   Glucose-Capillary 549 (HH) 70 - 99 mg/dL   Comment 1 Notify RN    Comment 2 Document in Chart   CBG monitoring, ED     Status: Abnormal   Collection Time: 11/30/23  1:19 PM  Result Value Ref Range   Glucose-Capillary 281 (H) 70 - 99 mg/dL  I-stat chem 8, ED (not at [facility], [facility] or [facility])     Status: Abnormal   Collection Time: 11/30/23  2:03 PM  Result Value Ref Range   Sodium 135 135 - 145 mmol/L   Potassium 4.9 3.5 - 5.1 mmol/L   Chloride 108 98 - 111 mmol/L   BUN 20 6 - 20 mg/dL   Creatinine, Ser 7.82 0.61 - 1.24 mg/dL   Glucose, Bld 956 (H) 70 - 99 mg/dL   Calcium, Ion 2.13 0.86 - 1.40 mmol/L   TCO2 14 (L) 22 - 32 mmol/L   Hemoglobin 16.0 13.0 - 17.0 g/dL   HCT 57.8 46.9 - 62.9 %   Basic Metabolic Panel: Recent Labs  Lab 11/30/23 1012 11/30/23 1403  NA 129* 135  K 5.6* 4.9  CL  --  108  GLUCOSE  --  264*  BUN  --  20  CREATININE  --  0.90   Liver Function Tests: No results for input(s): "AST", "ALT", "ALKPHOS", "BILITOT", "PROT", "ALBUMIN" in the last 168 hours. No results for input(s): "LIPASE", "AMYLASE" in the last 168 hours. No results for input(s): "AMMONIA" in the last 168 hours. CBC: Recent Labs  Lab 11/30/23 0958 11/30/23 1012 11/30/23 1403  WBC 8.3  --   --   NEUTROABS 6.5  --   --   HGB 16.7 18.0* 16.0  HCT 49.5 53.0* 47.0  MCV 87.8  --   --   PLT 405*  --   --    Cardiac Enzymes: No results for input(s): "CKTOTAL", "CKMB", "CKMBINDEX", "TROPONINIHS" in the last 168 hours.  BNP (last 3 results) No results for input(s):  "PROBNP" in the last 8760 hours. CBG: Recent Labs  Lab 11/30/23 0955 11/30/23 1144 11/30/23 1319  GLUCAP 599* 549* 281*    Radiological Exams on Admission:  No results found.  chest X-ray  EKG: Independently reviewed. NSR increased QRS volt.  Assessment and Plan: * DKA, type 1 (HCC) Patient with chronic type 1 diabetes mellitus recently started on insulin pump as an outpatient.  It cannot be ascertained however patient has been adherent to it.  Apparently it was beeping, although further details are not available.  At this time pump has been removed.  Patient has been started on insulin infusion.  As well as LR infusion.  At this time we are awaiting anion gap to come back.  We will check the BMP every 6 hourly after that. I will start the patient on bassal insulin right now with NPH 8 unit Q8hourly (based on discharge summary 10/24/2023 showing lantus use at 24 units daily). Will have to discuss with patinet/family if he is ok with resuming pump and reporting any malfunction promptly.  Influenza A Patient looking nontoxic, afebrile, no hypoxia.  Check chest x-ray.  Patient did present with vomiting, which may be due to influenza A.  Will check an abdominal x-ray as well.  Currently vomiting seems to have resolved.  Patient has reasonable appetite.   Troponin pending.  I will put admission order in once BMP is back.  Medication reconciliation pending pharmacy input.   Advance Care Planning:   Code Status: Prior full code.  Consults: I have put a referral DM coordinator.  Family Communication: per patient.  Severity of Illness: The appropriate patient status for this patient is INPATIENT. Inpatient status is judged to be reasonable and necessary in order to provide the required intensity of service to ensure the patient's safety. The patient's presenting symptoms, physical exam findings, and initial radiographic and laboratory data in the context of their chronic comorbidities  is felt to place them at high risk for further clinical deterioration. Furthermore, it is not anticipated that the patient will be medically stable for discharge from the hospital within 2 midnights of admission.   * I certify that at the point of admission it is my clinical judgment that the patient will require inpatient hospital care spanning beyond 2 midnights from the point of admission due to high intensity of service, high risk for further deterioration and high frequency of surveillance required.*  Author: Nolberto Hanlon, MD 11/30/2023 2:35 PM  For on call review [email or web address].

## 2023-12-01 ENCOUNTER — Other Ambulatory Visit (HOSPITAL_COMMUNITY): Payer: Self-pay

## 2023-12-01 DIAGNOSIS — E1010 Type 1 diabetes mellitus with ketoacidosis without coma: Secondary | ICD-10-CM | POA: Diagnosis not present

## 2023-12-01 LAB — BASIC METABOLIC PANEL
Anion gap: 11 (ref 5–15)
Anion gap: 14 (ref 5–15)
BUN: 13 mg/dL (ref 6–20)
BUN: 13 mg/dL (ref 6–20)
CO2: 20 mmol/L — ABNORMAL LOW (ref 22–32)
CO2: 23 mmol/L (ref 22–32)
Calcium: 9.3 mg/dL (ref 8.9–10.3)
Calcium: 9.5 mg/dL (ref 8.9–10.3)
Chloride: 103 mmol/L (ref 98–111)
Chloride: 103 mmol/L (ref 98–111)
Creatinine, Ser: 0.88 mg/dL (ref 0.61–1.24)
Creatinine, Ser: 0.95 mg/dL (ref 0.61–1.24)
GFR, Estimated: >60 mL/min (ref >60)
GFR, Estimated: >60 mL/min (ref >60)
Glucose, Bld: 162 mg/dL — ABNORMAL HIGH (ref 70–99)
Glucose, Bld: 172 mg/dL — ABNORMAL HIGH (ref 70–99)
Potassium: 3.6 mmol/L (ref 3.5–5.1)
Potassium: 3.8 mmol/L (ref 3.5–5.1)
Sodium: 137 mmol/L (ref 135–145)
Sodium: 137 mmol/L (ref 135–145)

## 2023-12-01 LAB — PROTIME-INR
INR: 1 (ref 0.8–1.2)
Prothrombin Time: 13.1 s (ref 11.4–15.2)

## 2023-12-01 LAB — CBG MONITORING, ED
Glucose-Capillary: 148 mg/dL — ABNORMAL HIGH (ref 70–99)
Glucose-Capillary: 157 mg/dL — ABNORMAL HIGH (ref 70–99)
Glucose-Capillary: 165 mg/dL — ABNORMAL HIGH (ref 70–99)
Glucose-Capillary: 184 mg/dL — ABNORMAL HIGH (ref 70–99)
Glucose-Capillary: 196 mg/dL — ABNORMAL HIGH (ref 70–99)

## 2023-12-01 LAB — CBC
HCT: 41.2 % (ref 39.0–52.0)
Hemoglobin: 14.2 g/dL (ref 13.0–17.0)
MCH: 29.6 pg (ref 26.0–34.0)
MCHC: 34.5 g/dL (ref 30.0–36.0)
MCV: 85.8 fL (ref 80.0–100.0)
Platelets: 323 10*3/uL (ref 150–400)
RBC: 4.8 MIL/uL (ref 4.22–5.81)
RDW: 12 % (ref 11.5–15.5)
WBC: 7.8 10*3/uL (ref 4.0–10.5)
nRBC: 0 % (ref 0.0–0.2)

## 2023-12-01 LAB — APTT: aPTT: 23 s — ABNORMAL LOW (ref 24–36)

## 2023-12-01 MED ORDER — INSULIN ASPART 100 UNIT/ML IJ SOLN
7.0000 [IU] | Freq: Three times a day (TID) | INTRAMUSCULAR | Status: DC
Start: 2023-12-01 — End: 2023-12-01
  Administered 2023-12-01: 7 [IU] via SUBCUTANEOUS

## 2023-12-01 MED ORDER — INSULIN ASPART 100 UNIT/ML IJ SOLN: 0.0000 [IU] | Freq: Three times a day (TID) | INTRAMUSCULAR | Status: DC

## 2023-12-01 MED ORDER — INSULIN ASPART 100 UNIT/ML IJ SOLN
0.0000 [IU] | Freq: Three times a day (TID) | INTRAMUSCULAR | Status: DC
Start: 2023-12-01 — End: 2023-12-01
  Administered 2023-12-01: 1 [IU] via SUBCUTANEOUS

## 2023-12-01 MED ORDER — INSULIN GLARGINE-YFGN 100 UNIT/ML ~~LOC~~ SOLN
24.0000 [IU] | Freq: Every day | SUBCUTANEOUS | Status: DC
  Administered 2023-12-01: 24 [IU] via SUBCUTANEOUS
  Filled 2023-12-01: qty 0.24

## 2023-12-01 MED ORDER — INSULIN ASPART 100 UNIT/ML IJ SOLN
0.0000 [IU] | Freq: Three times a day (TID) | INTRAMUSCULAR | Status: DC
Start: 2023-12-01 — End: 2023-12-01

## 2023-12-01 MED ORDER — INSULIN ASPART 100 UNIT/ML IJ SOLN: 0.0000 [IU] | Freq: Every day | INTRAMUSCULAR | Status: DC

## 2023-12-01 NOTE — Discharge Summary (Signed)
Physician Discharge Summary  [name] AVW:[id number] DOB: [birth year] DOA: 11/30/2023  PCP: [name], MD  Admit date: 11/30/2023 Discharge date: 12/01/2023 30 Day Unplanned Readmission Risk Score    Flowsheet Row ED to Hosp-Admission (Current) from 11/30/2023 in [facility] Emergency Department at [facility]  30 Day Unplanned Readmission Risk Score (%) 13.38 Filed at 12/01/2023 0801       This score is the patient's risk of an unplanned readmission within 30 days of being discharged (0 -100%). The score is based on dignosis, age, lab data, medications, orders, and past utilization.   Low:  0-14.9   Medium: 15-21.9   High: 22-29.9   Extreme: 30 and above          Admitted From: Home/homeless Disposition: Home/ homeless  Recommendations for Outpatient Follow-up:  Follow up with PCP in 1-2 weeks Please obtain BMP/CBC in one week Please follow up with your PCP on the following pending results: Unresulted Labs (From admission, onward)     Start     Ordered   12/07/23 0500  Creatinine, serum  (enoxaparin (LOVENOX)    CrCl >/= 30 ml/min)  Weekly,   R     Comments: while on enoxaparin therapy    11/30/23 1457              Home Health: None Equipment/Devices: None  Discharge Condition: Stable  CODE STATUS: Full code Diet recommendation: Diabetic  Following HPI is copied from admitting hospitalist H&P. HPI: Jacob [name] is a 33 y.o. male with medical history significant of DM1, started insulin pump about 7 days ago.  Unfortunately patient is very nonspecific with me with regards to how his insulin pump has been doing.  At 1 point he said that it had been beeping.  But then refused to answer if it was beeping yesterday or the day before or what time.  Patient simply said that he sleeps like a log and therefore does not know what been going on.     Regardless patient seems to have been feeling reasonably well till this morning.  Patient reports having had a cold  hot dog that made him nauseous and throw up 3 times.  Patient denies any diarrhea abdominal pain fever rigors.  This prompted the patient to come to the ER.   At this time patient reports that he is feeling totally fine and totally asymptomatic.  CBC in the ER is normal, comprehensive metabolic panel is pending.  Patient is noted to have a venous pH of 7.182 with bicarb of 12.7.  Anion gap is pending. Elevated Betahydroxybutyrate.  Subjective: Seen and examined and he has no complaints.  He is ready to go out of the hospital.  Brief/Interim Summary: Briefly, patient was admitted with DKA.  Treated per protocol with fluids and insulin drip and eventually DKA resolved and he was transitioned to long-acting insulin as well as SSI, insulin pump was stopped.  He was seen by diabetes coordinator, due to him being homeless, he was previously advised to switch to long and short acting combination insulin and that is what our diabetes coordinator recommended and based on their recommendations, he is being discharged on following regimen/Lantus 24 units to take at night, NovoLog 7 units 3 times daily Premeal and sliding scale insulin.  Per diabetes coordinator, these insulin regimens were prescribed to him on 11/14/2023 and 11/15/2023 and thus we are merely advising him to resume those and not prescribing new refills because he is not due for  refills until beginning of the March..  Patient is also in agreement.  Also, he was tested positive for influenza A which was incidental with the patient having no symptoms.  Patient was advised to drink plenty of fluids and keep himself hydrated and take Tylenol/ibuprofen for fever if needed.  Discharge Diagnoses:  Principal Problem:   DKA, type 1 (HCC) Active Problems:   Influenza A   DKA (diabetic ketoacidosis) (HCC)    Discharge Instructions   Allergies as of 12/01/2023   No Known Allergies      Medication List     STOP taking these medications    Dexcom  G6 Transmitter Misc   Dexcom G7 Receiver Devi       TAKE these medications    Accu-Chek Guide Test test strip Generic drug: glucose blood Use up to 3 strips a day when not wearing Continuous glucose monitor   Dexcom G7 Sensor Misc Place new sensor every 10 days. Use to continuously monitor blood sugar.   Insupen Pen Needles 32G X 4 MM Misc Generic drug: Insulin Pen Needle Use  3  times daily with insulin.   BD Pen Needle Nano 2nd Gen 32G X 4 MM Misc Generic drug: Insulin Pen Needle Use to inject insulin up to 4 times a day   Lancet Device Misc 1 each by Does not apply route 3 (three) times daily. May dispense any manufacturer covered by patient's insurance.   Lantus SoloStar 100 UNIT/ML Solostar Pen Generic drug: insulin glargine Inject 24 units into the skin daily.   NovoLOG FlexPen 100 UNIT/ML FlexPen Generic drug: insulin aspart Inject 7 units subcutaneously before breakfast, lunch and dinner What changed: Another medication with the same name was removed. Continue taking this medication, and follow the directions you see here.   Omnipod 5 DexG7G6 Pods Gen 5 Misc Use to administer up to 50 units, change pods every 2 days        Follow-up Information     [name], MD Follow up in 1 week(s).   Specialty: Internal Medicine Contact information: [address] [place] [address] [phone]                No Known Allergies  Consultations: None   Procedures/Studies: DG ABD ACUTE 2+V W 1V CHEST Result Date: 11/30/2023 CLINICAL DATA:  Emesis.  Flu EXAM: DG ABDOMEN ACUTE WITH 1 VIEW CHEST COMPARISON:  Chest x-ray 10/26/2021 FINDINGS: Hyperinflation. No consolidation, pneumothorax or effusion. No edema. Overlapping cardiac leads. Normal cardiopericardial silhouette. Large amount of diffuse colonic stool identified. Gas is seen in nondilated loops large bowel. Minimal small bowel gas. No obstruction. No definite free air seen beneath the diaphragm the  upright view. Overlapping cardiac leads. Presumed vascular calcifications in the pelvis. IMPRESSION: Large amount of colonic stool.  Nonspecific bowel gas pattern. No acute cardiopulmonary disease Electronically Signed   By: Karen Kays M.D.   On: 11/30/2023 16:28     Discharge Exam: Vitals:   12/01/23 0915 12/01/23 1000  BP: (!) 100/50 119/80  Pulse:    Resp:    Temp:    SpO2:     Vitals:   12/01/23 0745 12/01/23 0830 12/01/23 0915 12/01/23 1000  BP: 112/81 (!) 141/95 (!) 100/50 119/80  Pulse: 74     Resp: (!) 9 (!) 22    Temp:      TempSrc:      SpO2: 99%     Weight:      Height:  General: Pt is alert, awake, not in acute distress Cardiovascular: RRR, S1/S2 +, no rubs, no gallops Respiratory: CTA bilaterally, no wheezing, no rhonchi Abdominal: Soft, NT, ND, bowel sounds + Extremities: no edema, no cyanosis    The results of significant diagnostics from this hospitalization (including imaging, microbiology, ancillary and laboratory) are listed below for reference.     Microbiology: Recent Results (from the past 240 hours)  Resp panel by RT-PCR (RSV, Flu A&B, Covid) Anterior Nasal Swab     Status: Abnormal   Collection Time: 11/30/23  9:55 AM   Specimen: Anterior Nasal Swab  Result Value Ref Range Status   SARS Coronavirus 2 by RT PCR NEGATIVE NEGATIVE Final   Influenza A by PCR POSITIVE (A) NEGATIVE Final   Influenza B by PCR NEGATIVE NEGATIVE Final    Comment: (NOTE) The Xpert Xpress SARS-CoV-2/FLU/RSV plus assay is intended as an aid in the diagnosis of influenza from Nasopharyngeal swab specimens and should not be used as a sole basis for treatment. Nasal washings and aspirates are unacceptable for Xpert Xpress SARS-CoV-2/FLU/RSV testing.  Fact Sheet for Patients: BloggerCourse.com  Fact Sheet for Healthcare Providers: SeriousBroker.it  This test is not yet approved or cleared by the [place]  FDA and has been authorized for detection and/or diagnosis of SARS-CoV-2 by FDA under an Emergency Use Authorization (EUA). This EUA will remain in effect (meaning this test can be used) for the duration of the COVID-19 declaration under Section 564(b)(1) of the Act, 21 U.S.C. section 360bbb-3(b)(1), unless the authorization is terminated or revoked.     Resp Syncytial Virus by PCR NEGATIVE NEGATIVE Final    Comment: (NOTE) Fact Sheet for Patients: BloggerCourse.com  Fact Sheet for Healthcare Providers: SeriousBroker.it  This test is not yet approved or cleared by the [place] FDA and has been authorized for detection and/or diagnosis of SARS-CoV-2 by FDA under an Emergency Use Authorization (EUA). This EUA will remain in effect (meaning this test can be used) for the duration of the COVID-19 declaration under Section 564(b)(1) of the Act, 21 U.S.C. section 360bbb-3(b)(1), unless the authorization is terminated or revoked.  Performed at [facility], [address] [place], [place] [address]      Labs: BNP (last 3 results) No results for input(s): "BNP" in the last 8760 hours. Basic Metabolic Panel: Recent Labs  Lab 11/30/23 1403 11/30/23 1550 11/30/23 2024 12/01/23 0032 12/01/23 0500  NA 135 136 135 137 137  K 4.9 4.8 4.1 3.6 3.8  CL 108 101 101 103 103  CO2  --  13* 20* 23 20*  GLUCOSE 264* 226* 180* 162* 172*  BUN 20 18 14 13 13   CREATININE 0.90 1.34* 1.04 0.95 0.88  CALCIUM  --  10.1 9.5 9.5 9.3   Liver Function Tests: Recent Labs  Lab 11/30/23 1550  AST 21  ALT 46*  ALKPHOS 84  BILITOT 1.9*  PROT 7.4  ALBUMIN 4.4   Recent Labs  Lab 11/30/23 1550  LIPASE 18   No results for input(s): "AMMONIA" in the last 168 hours. CBC: Recent Labs  Lab 11/30/23 0958 11/30/23 1012 11/30/23 1403 11/30/23 1550 12/01/23 0500  WBC 8.3  --   --  12.8* 7.8  NEUTROABS 6.5  --   --   --   --   HGB  16.7 18.0* 16.0 15.6 14.2  HCT 49.5 53.0* 47.0 45.6 41.2  MCV 87.8  --   --  87.0 85.8  PLT 405*  --   --  361 323   Cardiac Enzymes: No results for input(s): "CKTOTAL", "CKMB", "CKMBINDEX", "TROPONINI" in the last 168 hours. BNP: Invalid input(s): "POCBNP" CBG: Recent Labs  Lab 12/01/23 0031 12/01/23 0221 12/01/23 0333 12/01/23 0504 12/01/23 0745  GLUCAP 148* 184* 196* 165* 157*   D-Dimer No results for input(s): "DDIMER" in the last 72 hours. Hgb A1c No results for input(s): "HGBA1C" in the last 72 hours. Lipid Profile No results for input(s): "CHOL", "HDL", "LDLCALC", "TRIG", "CHOLHDL", "LDLDIRECT" in the last 72 hours. Thyroid function studies No results for input(s): "TSH", "T4TOTAL", "T3FREE", "THYROIDAB" in the last 72 hours.  Invalid input(s): "FREET3" Anemia work up No results for input(s): "VITAMINB12", "FOLATE", "FERRITIN", "TIBC", "IRON", "RETICCTPCT" in the last 72 hours. Urinalysis    Component Value Date/Time   COLORURINE STRAW (A) 11/30/2023 0955   APPEARANCEUR CLEAR 11/30/2023 0955   LABSPEC 1.025 11/30/2023 0955   PHURINE 5.0 11/30/2023 0955   GLUCOSEU >=500 (A) 11/30/2023 0955   HGBUR SMALL (A) 11/30/2023 0955   BILIRUBINUR NEGATIVE 11/30/2023 0955   BILIRUBINUR negative 11/06/2018 1449   KETONESUR 80 (A) 11/30/2023 0955   PROTEINUR 30 (A) 11/30/2023 0955   UROBILINOGEN 0.2 11/06/2018 1449   NITRITE NEGATIVE 11/30/2023 0955   LEUKOCYTESUR NEGATIVE 11/30/2023 0955   Sepsis Labs Recent Labs  Lab 11/30/23 0958 11/30/23 1550 12/01/23 0500  WBC 8.3 12.8* 7.8   Microbiology Recent Results (from the past 240 hours)  Resp panel by RT-PCR (RSV, Flu A&B, Covid) Anterior Nasal Swab     Status: Abnormal   Collection Time: 11/30/23  9:55 AM   Specimen: Anterior Nasal Swab  Result Value Ref Range Status   SARS Coronavirus 2 by RT PCR NEGATIVE NEGATIVE Final   Influenza A by PCR POSITIVE (A) NEGATIVE Final   Influenza B by PCR NEGATIVE NEGATIVE  Final    Comment: (NOTE) The Xpert Xpress SARS-CoV-2/FLU/RSV plus assay is intended as an aid in the diagnosis of influenza from Nasopharyngeal swab specimens and should not be used as a sole basis for treatment. Nasal washings and aspirates are unacceptable for Xpert Xpress SARS-CoV-2/FLU/RSV testing.  Fact Sheet for Patients: BloggerCourse.com  Fact Sheet for Healthcare Providers: SeriousBroker.it  This test is not yet approved or cleared by the [place] FDA and has been authorized for detection and/or diagnosis of SARS-CoV-2 by FDA under an Emergency Use Authorization (EUA). This EUA will remain in effect (meaning this test can be used) for the duration of the COVID-19 declaration under Section 564(b)(1) of the Act, 21 U.S.C. section 360bbb-3(b)(1), unless the authorization is terminated or revoked.     Resp Syncytial Virus by PCR NEGATIVE NEGATIVE Final    Comment: (NOTE) Fact Sheet for Patients: BloggerCourse.com  Fact Sheet for Healthcare Providers: SeriousBroker.it  This test is not yet approved or cleared by the [place] FDA and has been authorized for detection and/or diagnosis of SARS-CoV-2 by FDA under an Emergency Use Authorization (EUA). This EUA will remain in effect (meaning this test can be used) for the duration of the COVID-19 declaration under Section 564(b)(1) of the Act, 21 U.S.C. section 360bbb-3(b)(1), unless the authorization is terminated or revoked.  Performed at [facility], [address], [place], [place] [address]     FURTHER DISCHARGE INSTRUCTIONS:   Get Medicines reviewed and adjusted: Please take all your medications with you for your next visit with your Primary MD   Laboratory/radiological data: Please request your Primary MD to go over all hospital tests and procedure/radiological results at  the follow up, please ask  your Primary MD to get all Hospital records sent to his/her office.   In some cases, they will be blood work, cultures and biopsy results pending at the time of your discharge. Please request that your primary care M.D. goes through all the records of your hospital data and follows up on these results.   Also Note the following: If you experience worsening of your admission symptoms, develop shortness of breath, life threatening emergency, suicidal or homicidal thoughts you must seek medical attention immediately by calling 911 or calling your MD immediately  if symptoms less severe.   You must read complete instructions/literature along with all the possible adverse reactions/side effects for all the Medicines you take and that have been prescribed to you. Take any new Medicines after you have completely understood and accpet all the possible adverse reactions/side effects.    Do not drive when taking Pain medications or sleeping medications (Benzodaizepines)   Do not take more than prescribed Pain, Sleep and Anxiety Medications. It is not advisable to combine anxiety,sleep and pain medications without talking with your primary care practitioner   Special Instructions: If you have smoked or chewed Tobacco  in the last 2 yrs please stop smoking, stop any regular Alcohol  and or any Recreational drug use.   Wear Seat belts while driving.   Please note: You were cared for by a hospitalist during your hospital stay. Once you are discharged, your primary care physician will handle any further medical issues. Please note that NO REFILLS for any discharge medications will be authorized once you are discharged, as it is imperative that you return to your primary care physician (or establish a relationship with a primary care physician if you do not have one) for your post hospital discharge needs so that they can reassess your need for medications and monitor your lab values  Time coordinating  discharge: Over 30 minutes  SIGNED:   Hughie Closs, MD  [facility] 12/01/2023, 11:16 AM *Please note that this is a verbal dictation therefore any spelling or grammatical errors are due to the "Dragon Medical One" system interpretation. If 7PM-7AM, please contact night-coverage [email or web address]

## 2023-12-01 NOTE — ED Notes (Signed)
Attempted to transition patient off insulin drip on endotool. Prompted by endotool to give 8 units of lantus. Consulted with pharmacy who consulted with doctor who verified they wanted 24 units of semglee. Verified with pharmacy to titrate insulin drip as before, without transition option on endotool, for 2 hours and then discontinue insulin drip.

## 2023-12-01 NOTE — Inpatient Diabetes Management (Signed)
Inpatient Diabetes Program Recommendations  AACE/ADA: New Consensus Statement on Inpatient Glycemic Control (2015)  Target Ranges:  Prepandial:   less than 140 mg/dL      Peak postprandial:   less than 180 mg/dL (1-2 hours)      Critically ill patients:  140 - 180 mg/dL   Lab Results  Component Value Date   GLUCAP 157 (H) 12/01/2023   HGBA1C 11.4 (H) 10/23/2023    Review of Glycemic Control  Latest Reference Range & Units 11/30/23 21:06 11/30/23 22:55 12/01/23 00:31 12/01/23 02:21 12/01/23 03:33 12/01/23 05:04 12/01/23 07:45  Glucose-Capillary 70 - 99 mg/dL 147 (H) 829 (H) 562 (H) 184 (H) 196 (H) 165 (H) 157 (H)   Diabetes history: DM 1 Outpatient Diabetes medications:  Newly started on Omnipod: Max Basal Rate = 2 U/hr 12am-12am Basal rate = 0.65 U/hr 12am-8am Target Glucose : 120mg /dL correct above 150mg /dL [id number] Target Glucose : 110 mg/dL correct above 846 mg/dL Temp Basal Rate on  [id number] Insulin to Carb ratio = 1g/unit  12am -12am Correction factor = 53 mg/dL/unit  Duration of insulin action - 4 hrs Max Bolus= 30 Units Current orders for Inpatient glycemic control:  Semglee 24 units Daily Novolog 0-6 units tid + hs Novolog 7 units tid meal coverage  Inpatient Diabetes Program Recommendations:    Spoke with pt at bedside. Explained plan to restart basal bolus regimen outpatient until a more stable living environment is established. Pt will not check CBGs at home. Pt can resume the CGM prescribed and he reports having a reader. Explained to pt to take basal insulin in the evenings and Novolog 7 units before the meals only if eating a meal (since he is homeless).  D/c orders:   According to pharmacy: Lantus was filled at [facility] on 11/15/2023 and Novolog was filled on 11/14/2023 and they can not be filled again until 01/06/2024    Thanks,  Christena Deem RN, MSN, BC-ADM Inpatient Diabetes Coordinator Team Pager [phone] (8a-5p)

## 2023-12-06 ENCOUNTER — Telehealth: Payer: Self-pay | Admitting: Dietician

## 2023-12-06 ENCOUNTER — Other Ambulatory Visit (HOSPITAL_COMMUNITY): Payer: Self-pay

## 2023-12-06 ENCOUNTER — Encounter: Payer: Self-pay | Admitting: Dietician

## 2023-12-06 NOTE — Telephone Encounter (Signed)
Cancelling both her son's appointments for this week. She says he needs to take his medicine, but she cannot get him to take it.  Returned mother's call, left a message that she should take Jacob [name] to the Emergency room per our attending physician. Marland Kitchen

## 2023-12-07 ENCOUNTER — Encounter: Payer: MEDICAID | Admitting: Student

## 2023-12-07 ENCOUNTER — Encounter: Payer: MEDICAID | Admitting: Dietician

## 2023-12-08 ENCOUNTER — Other Ambulatory Visit (HOSPITAL_COMMUNITY): Payer: Self-pay

## 2023-12-12 ENCOUNTER — Other Ambulatory Visit: Payer: Self-pay

## 2023-12-12 ENCOUNTER — Encounter (HOSPITAL_COMMUNITY): Payer: Self-pay | Admitting: Emergency Medicine

## 2023-12-12 ENCOUNTER — Emergency Department (HOSPITAL_COMMUNITY)
Admission: EM | Admit: 2023-12-12 | Discharge: 2023-12-12 | Disposition: A | Discharge status: Home / Self Care | Payer: MEDICAID | Attending: Emergency Medicine | Admitting: Emergency Medicine

## 2023-12-12 ENCOUNTER — Telehealth: Payer: Self-pay | Admitting: Dietician

## 2023-12-12 DIAGNOSIS — Z794 Long term (current) use of insulin: Secondary | ICD-10-CM | POA: Diagnosis not present

## 2023-12-12 DIAGNOSIS — R5383 Other fatigue: Secondary | ICD-10-CM | POA: Insufficient documentation

## 2023-12-12 DIAGNOSIS — E1369 Other specified diabetes mellitus with other specified complication: Secondary | ICD-10-CM | POA: Insufficient documentation

## 2023-12-12 DIAGNOSIS — F1510 Other stimulant abuse, uncomplicated: Secondary | ICD-10-CM | POA: Insufficient documentation

## 2023-12-12 DIAGNOSIS — Z5900 Homelessness unspecified: Secondary | ICD-10-CM | POA: Diagnosis not present

## 2023-12-12 LAB — BASIC METABOLIC PANEL
Anion gap: 11 (ref 5–15)
BUN: 9 mg/dL (ref 6–20)
CO2: 25 mmol/L (ref 22–32)
Calcium: 8.9 mg/dL (ref 8.9–10.3)
Chloride: 100 mmol/L (ref 98–111)
Creatinine, Ser: 0.83 mg/dL (ref 0.61–1.24)
GFR, Estimated: >60 mL/min (ref >60)
Glucose, Bld: 267 mg/dL — ABNORMAL HIGH (ref 70–99)
Potassium: 4.1 mmol/L (ref 3.5–5.1)
Sodium: 136 mmol/L (ref 135–145)

## 2023-12-12 LAB — CBC WITH DIFFERENTIAL/PLATELET
Abs Immature Granulocytes: 0.02 10*3/uL (ref 0.00–0.07)
Basophils Absolute: 0 10*3/uL (ref 0.0–0.1)
Basophils Relative: 1 %
Eosinophils Absolute: 0.1 10*3/uL (ref 0.0–0.5)
Eosinophils Relative: 2 %
HCT: 40.8 % (ref 39.0–52.0)
Hemoglobin: 13.2 g/dL (ref 13.0–17.0)
Immature Granulocytes: 0 %
Lymphocytes Relative: 36 %
Lymphs Abs: 2.4 10*3/uL (ref 0.7–4.0)
MCH: 29.2 pg (ref 26.0–34.0)
MCHC: 32.4 g/dL (ref 30.0–36.0)
MCV: 90.3 fL (ref 80.0–100.0)
Monocytes Absolute: 0.6 10*3/uL (ref 0.1–1.0)
Monocytes Relative: 9 %
Neutro Abs: 3.4 10*3/uL (ref 1.7–7.7)
Neutrophils Relative %: 52 %
Platelets: 384 10*3/uL (ref 150–400)
RBC: 4.52 MIL/uL (ref 4.22–5.81)
RDW: 12.4 % (ref 11.5–15.5)
WBC: 6.5 10*3/uL (ref 4.0–10.5)
nRBC: 0 % (ref 0.0–0.2)

## 2023-12-12 LAB — ETHANOL: Alcohol, Ethyl (B): <10 mg/dL (ref <10)

## 2023-12-12 NOTE — ED Notes (Signed)
 Pt now awake and up pacing in the hallway.

## 2023-12-12 NOTE — ED Triage Notes (Signed)
 [facility] "[facility]alling [facility]." Pt reports he is homeless and tired.  EMS BS 290.

## 2023-12-12 NOTE — ED Notes (Signed)
 Patient verbalizes understanding of discharge instructions. Opportunity for questioning and answers were provided. Armband removed by staff, pt discharged from ED. Pt requesting that this RN call mother for transportation. Mother contacted and states she is on the way. Pt ambulatory to ED waiting room with steady gait.

## 2023-12-12 NOTE — Telephone Encounter (Signed)
 Mother calls stating "she is having trouble with Zandyr. He is acting like he is drunk. I called the police." I informed her that his blood sugar could be low or high, she said did not think he has a CGM on or a meter with him. I told her to tell the police that he has Type 1 diabetes and he may be having a medical emergency. She verbalized understanding.

## 2023-12-12 NOTE — ED Provider Notes (Signed)
[place] EMERGENCY DEPARTMENT AT [facility] Provider Note   CSN: [id number] Arrival date & time: 12/12/23  1158     History  Chief Complaint  Patient presents with   Fatigue    Jacob [name] is a 33 y.o. male.  33 year old male with history of insulin-dependent diabetes and homelessness who presents to the emergency department with fatigue.  Patient reports that he did some meth and was up for several days.  Went home to his mother's house and fell asleep.  She had difficulty arousing him because he was so tired and so she took him to the [facility] who called to have him come to the emergency department.  Says he did not take his insulin yet today but has plenty of insulin to take when needed.       Home Medications Prior to Admission medications   Medication Sig Start Date End Date Taking? Authorizing Provider  Continuous Glucose Sensor (DEXCOM G7 SENSOR) MISC Place new sensor every 10 days. Use to continuously monitor blood sugar. 11/16/23   Katheran James, DO  glucose blood (ACCU-CHEK GUIDE TEST) test strip Use up to 3 strips a day when not wearing Continuous glucose monitor 11/16/23   Katheran James, DO  insulin aspart (NOVOLOG FLEXPEN) 100 UNIT/ML FlexPen Inject 7 units subcutaneously before breakfast, lunch and dinner Patient not taking: Reported on 11/30/2023 10/24/23   Morrie Sheldon, MD  Insulin Disposable Pump (OMNIPOD 5 DEXG7G6 PODS GEN 5) MISC Use to administer up to 50 units, change pods every 2 days 11/10/23   [name], MD  insulin glargine (LANTUS) 100 UNIT/ML Solostar Pen Inject 24 units into the skin daily. Patient not taking: Reported on 11/30/2023 11/08/23 11/07/24  [name], MD  Insulin Pen Needle (BD PEN NEEDLE NANO 2ND GEN) 32G X 4 MM MISC Use to inject insulin up to 4 times a day 11/16/23   Katheran James, DO  Insulin Pen Needle (BD PEN NEEDLE NANO U/F) 32G X 4 MM MISC Use  3  times daily with insulin. 11/15/23   Chauncey Mann, DO  Lancet  Device MISC 1 each by Does not apply route 3 (three) times daily. May dispense any manufacturer covered by patient's insurance. 10/24/23   Morrie Sheldon, MD      Allergies    Patient has no known allergies.    Review of Systems   Review of Systems  Physical Exam Updated Vital Signs BP (!) 143/92 (BP Location: Right Arm)   Pulse 82   Temp 97.6 F (36.4 C) (Oral)   Resp 20   Ht 5\' 9"  (1.753 m)   Wt 61.7 kg   SpO2 99%   BMI 20.09 kg/m  Physical Exam Vitals and nursing note reviewed.  Constitutional:      General: He is not in acute distress.    Appearance: He is well-developed.  HENT:     Head: Normocephalic and atraumatic.     Right Ear: External ear normal.     Left Ear: External ear normal.     Nose: Nose normal.  Eyes:     Extraocular Movements: Extraocular movements intact.     Conjunctiva/sclera: Conjunctivae normal.     Pupils: Pupils are equal, round, and reactive to light.  Cardiovascular:     Rate and Rhythm: Normal rate and regular rhythm.     Heart sounds: Normal heart sounds.  Pulmonary:     Effort: Pulmonary effort is normal. No respiratory distress.     Breath sounds:  Normal breath sounds.  Musculoskeletal:     Cervical back: Normal range of motion and neck supple.  Skin:    General: Skin is warm and dry.  Neurological:     Mental Status: He is alert. Mental status is at baseline.  Psychiatric:        Mood and Affect: Mood normal.        Behavior: Behavior normal.     ED Results / Procedures / Treatments   Labs (all labs ordered are listed, but only abnormal results are displayed) Labs Reviewed  BASIC METABOLIC PANEL - Abnormal; Notable for the following components:      Result Value   Glucose, Bld 267 (*)    All other components within normal limits  CBC WITH DIFFERENTIAL/PLATELET  ETHANOL    EKG None  Radiology No results found.  Procedures Procedures    Medications Ordered in ED Medications - No data to display  ED Course/  Medical Decision Making/ A&P                                 Medical Decision Making  Jacob [name] is a 33 y.o. male with comorbidities that complicate the patient evaluation including insulin-dependent diabetes and homelessness who presents to the emergency department with fatigue.    Initial Ddx:  Sympathomimetic withdrawal, sleep deprivation, infection, intoxication, homelessness  MDM/Course:  Patient presents emergency department with fatigue.  On exam is alert and oriented.  I suspect that he likely is very tired from his meth use and the fact that he was up for several days.  May have a component of sympathomimetic withdrawal going on as well.  Was hyperglycemic but says he has not taken his insulin.  Had labs that did not show evidence of DKA.  He reports that he has insulin available to him to take.  Mom follow-up with his primary doctor in several days but do not feel that there is an acute life-threatening etiology of his fatigue at this time..  This patient presents to the ED for concern of complaints listed in HPI, this involves an extensive number of treatment options, and is a complaint that carries with it a high risk of complications and morbidity. Disposition including potential need for admission considered.   Dispo: DC Home. Return precautions discussed including, but not limited to, those listed in the AVS. Allowed pt time to ask questions which were answered fully prior to dc.  Records reviewed Outpatient Clinic Notes The following labs were independently interpreted: Chemistry and show Hyperglycemia I have reviewed the patients home medications and made adjustments as needed Social Determinants of health:  Homelessness, substance use  Portions of this note were generated with Scientist, clinical (histocompatibility and immunogenetics). Dictation errors may occur despite best attempts at proofreading.     Final Clinical Impression(s) / ED Diagnoses Final diagnoses:  Other fatigue  Other specified  diabetes mellitus with other specified complication, with long-term current use of insulin (HCC)  Methamphetamine use (HCC)    Rx / DC Orders ED Discharge Orders     None         Rondel Baton, MD 12/14/23 1207

## 2023-12-12 NOTE — Discharge Instructions (Signed)
 You were seen for your tiredness in the emergency department.   At home, please take your insulin  as prescribed.    Check your MyChart online for the results of any tests that had not resulted by the time you left the emergency department.   Follow-up with your primary doctor in 2-3 days regarding your visit.    Return immediately to the emergency department if you experience any of the following: Excessive fatigue, or any other concerning symptoms.    Thank you for visiting our Emergency Department. It was a pleasure taking care of you today.

## 2023-12-12 NOTE — ED Provider Triage Note (Signed)
 Emergency Medicine Provider Triage Evaluation Note  Jacob [name] , a 33 y.o. male  was evaluated in triage.  Pt complains of general fatigue and sleepiness.  Review of Systems  Positive: Tired Negative: Chest pain  Physical Exam  BP 117/87 (BP Location: Right Arm)   Pulse 77   Temp 98.7 F (37.1 C) (Oral)   Resp 18   SpO2 100%  Gen:    no distress   Resp:  Normal effort  MSK:   Moves extremities without difficulty  Other:    Medical Decision Making  Medically screening exam initiated at 12:29 PM.  Appropriate orders placed.  Jacob [name] was informed that the remainder of the evaluation will be completed by another provider, this initial triage assessment does not replace that evaluation, and the importance of remaining in the ED until their evaluation is complete.     Tonya Fredrickson, MD 12/12/23 [phone]

## 2023-12-14 ENCOUNTER — Encounter (HOSPITAL_COMMUNITY): Payer: Self-pay | Admitting: Emergency Medicine

## 2023-12-14 ENCOUNTER — Emergency Department (HOSPITAL_COMMUNITY)
Admission: EM | Admit: 2023-12-14 | Discharge: 2023-12-14 | Disposition: A | Discharge status: Home / Self Care | Payer: MEDICAID | Attending: Emergency Medicine | Admitting: Emergency Medicine

## 2023-12-14 ENCOUNTER — Other Ambulatory Visit (HOSPITAL_COMMUNITY): Payer: Self-pay

## 2023-12-14 ENCOUNTER — Telehealth: Payer: Self-pay | Admitting: Dietician

## 2023-12-14 ENCOUNTER — Other Ambulatory Visit: Payer: Self-pay

## 2023-12-14 DIAGNOSIS — Z794 Long term (current) use of insulin: Secondary | ICD-10-CM | POA: Diagnosis not present

## 2023-12-14 DIAGNOSIS — Z5900 Homelessness unspecified: Secondary | ICD-10-CM | POA: Insufficient documentation

## 2023-12-14 DIAGNOSIS — E109 Type 1 diabetes mellitus without complications: Secondary | ICD-10-CM | POA: Diagnosis not present

## 2023-12-14 DIAGNOSIS — Z711 Person with feared health complaint in whom no diagnosis is made: Secondary | ICD-10-CM | POA: Insufficient documentation

## 2023-12-14 NOTE — ED Notes (Signed)
Pt uncooperative. Pt did not want vitals checked before discharge. Pt escorted out by security to lobby.

## 2023-12-14 NOTE — ED Provider Notes (Signed)
[place] EMERGENCY DEPARTMENT AT [name] Provider Note   CSN: [id number] Arrival date & time: 12/14/23  0327     History  Chief Complaint  Patient presents with   Homeless    Olson Brahmbhatt is a 33 y.o. male, history of schizoaffective disorder, type 1 diabetes, who presents to the ED secondary to homelessness.  He states he wants somewhere to sleep.  Denies any other complaints.    Home Medications Prior to Admission medications   Medication Sig Start Date End Date Taking? Authorizing Provider  Continuous Glucose Sensor (DEXCOM G7 SENSOR) MISC Place new sensor every 10 days. Use to continuously monitor blood sugar. 11/16/23   Katheran James, DO  glucose blood (ACCU-CHEK GUIDE TEST) test strip Use up to 3 strips a day when not wearing Continuous glucose monitor 11/16/23   Katheran James, DO  insulin aspart (NOVOLOG FLEXPEN) 100 UNIT/ML FlexPen Inject 7 units subcutaneously before breakfast, lunch and dinner Patient not taking: Reported on 11/30/2023 10/24/23   Morrie Sheldon, MD  Insulin Disposable Pump (OMNIPOD 5 DEXG7G6 PODS GEN 5) MISC Use to administer up to 50 units, change pods every 2 days 11/10/23   [name], MD  insulin glargine (LANTUS) 100 UNIT/ML Solostar Pen Inject 24 units into the skin daily. Patient not taking: Reported on 11/30/2023 11/08/23 11/07/24  [name], MD  Insulin Pen Needle (BD PEN NEEDLE NANO 2ND GEN) 32G X 4 MM MISC Use to inject insulin up to 4 times a day 11/16/23   Katheran James, DO  Insulin Pen Needle (BD PEN NEEDLE NANO U/F) 32G X 4 MM MISC Use  3  times daily with insulin. 11/15/23   Chauncey Mann, DO  Lancet Device MISC 1 each by Does not apply route 3 (three) times daily. May dispense any manufacturer covered by patient's insurance. 10/24/23   Morrie Sheldon, MD      Allergies    Patient has no known allergies.    Review of Systems   Review of Systems  Respiratory:  Negative for shortness of breath.    Cardiovascular:  Negative for chest pain.    Physical Exam Updated Vital Signs BP 133/86 (BP Location: Right Arm)   Pulse 84   Temp 98 F (36.7 C) (Oral)   Resp 16   Ht 5\' 9"  (1.753 m)   Wt 61.7 kg   SpO2 100%   BMI 20.08 kg/m  Physical Exam Vitals and nursing note reviewed.  Constitutional:      General: He is not in acute distress.    Appearance: He is well-developed.  HENT:     Head: Normocephalic and atraumatic.  Eyes:     Conjunctiva/sclera: Conjunctivae normal.  Cardiovascular:     Rate and Rhythm: Normal rate and regular rhythm.     Heart sounds: No murmur heard. Pulmonary:     Effort: Pulmonary effort is normal. No respiratory distress.     Breath sounds: Normal breath sounds.  Abdominal:     Palpations: Abdomen is soft.     Tenderness: There is no abdominal tenderness.  Musculoskeletal:        General: No swelling.     Cervical back: Neck supple.  Skin:    General: Skin is warm and dry.     Capillary Refill: Capillary refill takes less than 2 seconds.  Neurological:     Mental Status: He is alert.  Psychiatric:        Mood and Affect: Mood normal.  Comments: Agitated     ED Results / Procedures / Treatments   Labs (all labs ordered are listed, but only abnormal results are displayed) Labs Reviewed - No data to display  EKG None  Radiology No results found.  Procedures Procedures    Medications Ordered in ED Medications - No data to display  ED Course/ Medical Decision Making/ A&P                                 Medical Decision Making Patient states he is in the ER, because he wants to sleep.  Denies any other complaints.  Provided with list of shelters, and discharged home.  He is well-appearing, at this time does not appear to be in any acute distress.  Vitals are stable   Final Clinical Impression(s) / ED Diagnoses Final diagnoses:  Homelessness    Rx / DC Orders ED Discharge Orders     None         Wyonia Fontanella, Harley [place], PA 12/14/23 1610    Dione Booze, MD 12/14/23 [phone]

## 2023-12-14 NOTE — Discharge Instructions (Signed)
I have provided you with a list of shelters, for you to sleep at. Return to the ED for emergent conditions.

## 2023-12-14 NOTE — Telephone Encounter (Signed)
Cancelling her son's appointment.

## 2023-12-14 NOTE — ED Triage Notes (Signed)
  Patient comes in stating he is homeless and tired.  Denies any pain but states he is tired.

## 2023-12-15 ENCOUNTER — Encounter: Payer: MEDICAID | Admitting: Student

## 2023-12-15 ENCOUNTER — Encounter: Payer: MEDICAID | Admitting: Dietician

## 2023-12-15 ENCOUNTER — Other Ambulatory Visit: Payer: Self-pay | Admitting: Internal Medicine

## 2023-12-15 ENCOUNTER — Other Ambulatory Visit (HOSPITAL_COMMUNITY): Payer: Self-pay

## 2023-12-15 ENCOUNTER — Other Ambulatory Visit: Payer: Self-pay

## 2023-12-15 DIAGNOSIS — E10649 Type 1 diabetes mellitus with hypoglycemia without coma: Secondary | ICD-10-CM

## 2023-12-15 DIAGNOSIS — E109 Type 1 diabetes mellitus without complications: Secondary | ICD-10-CM

## 2023-12-15 MED ORDER — NOVOLOG FLEXPEN 100 UNIT/ML ~~LOC~~ SOPN
7.0000 [IU] | PEN_INJECTOR | Freq: Three times a day (TID) | SUBCUTANEOUS | 3 refills | Status: DC
Start: 2023-12-15 — End: 2023-12-17
  Filled 2023-12-15: qty 15, 62d supply, fill #0
  Filled 2023-12-15: qty 15, 60d supply, fill #0

## 2023-12-15 MED ORDER — LANTUS SOLOSTAR 100 UNIT/ML ~~LOC~~ SOPN
24.0000 [IU] | PEN_INJECTOR | Freq: Every day | SUBCUTANEOUS | 3 refills | Status: DC
  Filled 2023-12-15: qty 15, 62d supply, fill #0
  Filled 2023-12-15: qty 15, fill #0

## 2023-12-15 NOTE — Telephone Encounter (Signed)
Medication sent to pharmacy

## 2023-12-16 ENCOUNTER — Other Ambulatory Visit: Payer: Self-pay

## 2023-12-16 ENCOUNTER — Observation Stay (HOSPITAL_COMMUNITY)
Admission: EM | Admit: 2023-12-16 | Discharge: 2023-12-17 | Disposition: A | Discharge status: Home / Self Care | Payer: MEDICAID | Attending: Internal Medicine | Admitting: Internal Medicine

## 2023-12-16 ENCOUNTER — Encounter (HOSPITAL_COMMUNITY): Payer: Self-pay

## 2023-12-16 DIAGNOSIS — N179 Acute kidney failure, unspecified: Secondary | ICD-10-CM | POA: Diagnosis not present

## 2023-12-16 DIAGNOSIS — E1010 Type 1 diabetes mellitus with ketoacidosis without coma: Principal | ICD-10-CM | POA: Insufficient documentation

## 2023-12-16 DIAGNOSIS — R112 Nausea with vomiting, unspecified: Secondary | ICD-10-CM | POA: Diagnosis present

## 2023-12-16 DIAGNOSIS — E10649 Type 1 diabetes mellitus with hypoglycemia without coma: Secondary | ICD-10-CM

## 2023-12-16 DIAGNOSIS — Z794 Long term (current) use of insulin: Secondary | ICD-10-CM | POA: Diagnosis not present

## 2023-12-16 DIAGNOSIS — Z5900 Homelessness unspecified: Secondary | ICD-10-CM | POA: Diagnosis not present

## 2023-12-16 DIAGNOSIS — Z59819 Housing instability, housed unspecified: Secondary | ICD-10-CM

## 2023-12-16 DIAGNOSIS — Z720 Tobacco use: Secondary | ICD-10-CM | POA: Diagnosis not present

## 2023-12-16 DIAGNOSIS — E109 Type 1 diabetes mellitus without complications: Secondary | ICD-10-CM

## 2023-12-16 DIAGNOSIS — E1110 Type 2 diabetes mellitus with ketoacidosis without coma: Secondary | ICD-10-CM | POA: Diagnosis present

## 2023-12-16 LAB — URINALYSIS, ROUTINE W REFLEX MICROSCOPIC
Bacteria, UA: NONE SEEN
Bilirubin Urine: NEGATIVE
Glucose, UA: >=500 mg/dL — AB
Hgb urine dipstick: NEGATIVE
Ketones, ur: 80 mg/dL — AB
Leukocytes,Ua: NEGATIVE
Nitrite: NEGATIVE
Protein, ur: NEGATIVE mg/dL
Specific Gravity, Urine: 1.026 (ref 1.005–1.030)
pH: 5 (ref 5.0–8.0)

## 2023-12-16 LAB — CBC WITH DIFFERENTIAL/PLATELET
Abs Immature Granulocytes: 0.01 10*3/uL (ref 0.00–0.07)
Basophils Absolute: 0.1 10*3/uL (ref 0.0–0.1)
Basophils Relative: 1 %
Eosinophils Absolute: 0 10*3/uL (ref 0.0–0.5)
Eosinophils Relative: 0 %
HCT: 44.1 % (ref 39.0–52.0)
Hemoglobin: 14.6 g/dL (ref 13.0–17.0)
Immature Granulocytes: 0 %
Lymphocytes Relative: 26 %
Lymphs Abs: 1.9 10*3/uL (ref 0.7–4.0)
MCH: 29 pg (ref 26.0–34.0)
MCHC: 33.1 g/dL (ref 30.0–36.0)
MCV: 87.7 fL (ref 80.0–100.0)
Monocytes Absolute: 0.4 10*3/uL (ref 0.1–1.0)
Monocytes Relative: 5 %
Neutro Abs: 5 10*3/uL (ref 1.7–7.7)
Neutrophils Relative %: 68 %
Platelets: 458 10*3/uL — ABNORMAL HIGH (ref 150–400)
RBC: 5.03 MIL/uL (ref 4.22–5.81)
RDW: 11.9 % (ref 11.5–15.5)
WBC: 7.4 10*3/uL (ref 4.0–10.5)
nRBC: 0 % (ref 0.0–0.2)

## 2023-12-16 LAB — COMPREHENSIVE METABOLIC PANEL
ALT: 41 U/L (ref 0–44)
AST: 28 U/L (ref 15–41)
Albumin: 4.5 g/dL (ref 3.5–5.0)
Alkaline Phosphatase: 94 U/L (ref 38–126)
Anion gap: 19 — ABNORMAL HIGH (ref 5–15)
BUN: 22 mg/dL — ABNORMAL HIGH (ref 6–20)
CO2: 19 mmol/L — ABNORMAL LOW (ref 22–32)
Calcium: 9.6 mg/dL (ref 8.9–10.3)
Chloride: 92 mmol/L — ABNORMAL LOW (ref 98–111)
Creatinine, Ser: 1.27 mg/dL — ABNORMAL HIGH (ref 0.61–1.24)
GFR, Estimated: >60 mL/min (ref >60)
Glucose, Bld: 430 mg/dL — ABNORMAL HIGH (ref 70–99)
Potassium: 4.9 mmol/L (ref 3.5–5.1)
Sodium: 130 mmol/L — ABNORMAL LOW (ref 135–145)
Total Bilirubin: 1.4 mg/dL — ABNORMAL HIGH (ref 0.0–1.2)
Total Protein: 7.5 g/dL (ref 6.5–8.1)

## 2023-12-16 LAB — CBG MONITORING, ED: Glucose-Capillary: 471 mg/dL — ABNORMAL HIGH (ref 70–99)

## 2023-12-16 NOTE — ED Provider Triage Note (Signed)
Emergency Medicine Provider Triage Evaluation Note  Jacob [name] , a 33 y.o. male  was evaluated in triage.  Pt complains of high blood sugar.  Patient is concerned about his blood sugar states he has not taken his medication in a while since expensive and cannot afford it.  Endorses increased urination.  Review of Systems  Positive: Urinary frequency Negative: Nausea, vomiting, diarrhea, abdominal pain  Physical Exam  BP 128/78   Pulse 91   Temp 98.1 F (36.7 C)   Resp 18   SpO2 100%  Gen:   Awake, no distress   Resp:  Normal effort  MSK:   Moves extremities without difficulty  Other:    Medical Decision Making  Medically screening exam initiated at 5:57 PM.  Appropriate orders placed.  Jacob [name] was informed that the remainder of the evaluation will be completed by another provider, this initial triage assessment does not replace that evaluation, and the importance of remaining in the ED until their evaluation is complete.    Jacob Marion, [name] 12/16/23 1758

## 2023-12-16 NOTE — ED Triage Notes (Addendum)
Pt states he thinks his sugar is high; hx DM, states he has been out of meds x a couple of days or weeks, he can't remember; denies pain

## 2023-12-17 ENCOUNTER — Other Ambulatory Visit (HOSPITAL_COMMUNITY): Payer: Self-pay

## 2023-12-17 DIAGNOSIS — Z5900 Homelessness unspecified: Secondary | ICD-10-CM | POA: Diagnosis not present

## 2023-12-17 DIAGNOSIS — N179 Acute kidney failure, unspecified: Secondary | ICD-10-CM | POA: Insufficient documentation

## 2023-12-17 DIAGNOSIS — Z59819 Housing instability, housed unspecified: Secondary | ICD-10-CM

## 2023-12-17 DIAGNOSIS — E1010 Type 1 diabetes mellitus with ketoacidosis without coma: Secondary | ICD-10-CM | POA: Diagnosis not present

## 2023-12-17 LAB — I-STAT VENOUS BLOOD GAS, ED
Acid-base deficit: 7 mmol/L — ABNORMAL HIGH (ref 0.0–2.0)
Bicarbonate: 18.6 mmol/L — ABNORMAL LOW (ref 20.0–28.0)
Calcium, Ion: 1.14 mmol/L — ABNORMAL LOW (ref 1.15–1.40)
HCT: 48 % (ref 39.0–52.0)
Hemoglobin: 16.3 g/dL (ref 13.0–17.0)
O2 Saturation: 97 %
Potassium: 4.4 mmol/L (ref 3.5–5.1)
Sodium: 131 mmol/L — ABNORMAL LOW (ref 135–145)
TCO2: 20 mmol/L — ABNORMAL LOW (ref 22–32)
pCO2, Ven: 36 mm[Hg] — ABNORMAL LOW (ref 44–60)
pH, Ven: 7.321 (ref 7.25–7.43)
pO2, Ven: 101 mm[Hg] — ABNORMAL HIGH (ref 32–45)

## 2023-12-17 LAB — BASIC METABOLIC PANEL
Anion gap: 12 (ref 5–15)
Anion gap: 8 (ref 5–15)
Anion gap: 6 (ref 5–15)
BUN: 16 mg/dL (ref 6–20)
BUN: 14 mg/dL (ref 6–20)
BUN: 14 mg/dL (ref 6–20)
CO2: 21 mmol/L — ABNORMAL LOW (ref 22–32)
CO2: 24 mmol/L (ref 22–32)
CO2: 25 mmol/L (ref 22–32)
Calcium: 9.1 mg/dL (ref 8.9–10.3)
Calcium: 9 mg/dL (ref 8.9–10.3)
Calcium: 8.7 mg/dL — ABNORMAL LOW (ref 8.9–10.3)
Chloride: 99 mmol/L (ref 98–111)
Chloride: 101 mmol/L (ref 98–111)
Chloride: 97 mmol/L — ABNORMAL LOW (ref 98–111)
Creatinine, Ser: 1 mg/dL (ref 0.61–1.24)
Creatinine, Ser: 0.84 mg/dL (ref 0.61–1.24)
Creatinine, Ser: 0.88 mg/dL (ref 0.61–1.24)
GFR, Estimated: >60 mL/min (ref >60)
GFR, Estimated: >60 mL/min (ref >60)
GFR, Estimated: >60 mL/min (ref >60)
Glucose, Bld: 154 mg/dL — ABNORMAL HIGH (ref 70–99)
Glucose, Bld: 144 mg/dL — ABNORMAL HIGH (ref 70–99)
Glucose, Bld: 280 mg/dL — ABNORMAL HIGH (ref 70–99)
Potassium: 4.3 mmol/L (ref 3.5–5.1)
Potassium: 4 mmol/L (ref 3.5–5.1)
Potassium: 4.5 mmol/L (ref 3.5–5.1)
Sodium: 132 mmol/L — ABNORMAL LOW (ref 135–145)
Sodium: 133 mmol/L — ABNORMAL LOW (ref 135–145)
Sodium: 128 mmol/L — ABNORMAL LOW (ref 135–145)

## 2023-12-17 LAB — BETA-HYDROXYBUTYRIC ACID: Beta-Hydroxybutyric Acid: 6.87 mmol/L — ABNORMAL HIGH (ref 0.05–0.27)

## 2023-12-17 LAB — CBG MONITORING, ED
Glucose-Capillary: 123 mg/dL — ABNORMAL HIGH (ref 70–99)
Glucose-Capillary: 140 mg/dL — ABNORMAL HIGH (ref 70–99)
Glucose-Capillary: 141 mg/dL — ABNORMAL HIGH (ref 70–99)
Glucose-Capillary: 162 mg/dL — ABNORMAL HIGH (ref 70–99)
Glucose-Capillary: 182 mg/dL — ABNORMAL HIGH (ref 70–99)
Glucose-Capillary: 227 mg/dL — ABNORMAL HIGH (ref 70–99)
Glucose-Capillary: 257 mg/dL — ABNORMAL HIGH (ref 70–99)
Glucose-Capillary: 274 mg/dL — ABNORMAL HIGH (ref 70–99)
Glucose-Capillary: 290 mg/dL — ABNORMAL HIGH (ref 70–99)
Glucose-Capillary: 292 mg/dL — ABNORMAL HIGH (ref 70–99)
Glucose-Capillary: 300 mg/dL — ABNORMAL HIGH (ref 70–99)
Glucose-Capillary: 332 mg/dL — ABNORMAL HIGH (ref 70–99)

## 2023-12-17 LAB — LIPASE, BLOOD: Lipase: 25 U/L (ref 11–51)

## 2023-12-17 MED ORDER — LACTATED RINGERS IV SOLN: INTRAVENOUS | Status: DC

## 2023-12-17 MED ORDER — DEXTROSE 50 % IV SOLN: 0.0000 mL | INTRAVENOUS | Status: DC | PRN

## 2023-12-17 MED ORDER — INSULIN REGULAR(HUMAN) IN NACL 100-0.9 UT/100ML-% IV SOLN
INTRAVENOUS | Status: DC
  Administered 2023-12-17: 9 [IU]/h via INTRAVENOUS
  Filled 2023-12-17: qty 100

## 2023-12-17 MED ORDER — LACTATED RINGERS IV BOLUS
20.0000 mL/kg | Freq: Once | INTRAVENOUS | Status: AC
  Administered 2023-12-17: 1234 mL via INTRAVENOUS

## 2023-12-17 MED ORDER — POTASSIUM CHLORIDE 10 MEQ/100ML IV SOLN
10.0000 meq | INTRAVENOUS | Status: AC
  Administered 2023-12-17 (×2): 10 meq via INTRAVENOUS
  Filled 2023-12-17 (×2): qty 100

## 2023-12-17 MED ORDER — INSULIN GLARGINE-YFGN 100 UNIT/ML ~~LOC~~ SOLN
24.0000 [IU] | Freq: Every day | SUBCUTANEOUS | Status: DC
  Administered 2023-12-17: 24 [IU] via SUBCUTANEOUS
  Filled 2023-12-17: qty 0.24

## 2023-12-17 MED ORDER — RIVAROXABAN 10 MG PO TABS
10.0000 mg | ORAL_TABLET | Freq: Every day | ORAL | Status: DC
  Administered 2023-12-17: 10 mg via ORAL
  Filled 2023-12-17: qty 1

## 2023-12-17 MED ORDER — DEXTROSE IN LACTATED RINGERS 5 % IV SOLN: INTRAVENOUS | Status: DC

## 2023-12-17 MED ORDER — INSULIN ASPART 100 UNIT/ML IJ SOLN
7.0000 [IU] | Freq: Once | INTRAMUSCULAR | Status: AC
  Administered 2023-12-17: 7 [IU] via SUBCUTANEOUS

## 2023-12-17 MED ORDER — INSULIN LISPRO (1 UNIT DIAL) 100 UNIT/ML (KWIKPEN)
7.0000 [IU] | PEN_INJECTOR | Freq: Three times a day (TID) | SUBCUTANEOUS | 0 refills | Status: DC
  Filled 2023-12-17: qty 6, 28d supply, fill #0
  Filled 2023-12-17: qty 6, 29d supply, fill #0
  Filled 2023-12-17: qty 15, 71d supply, fill #0
  Filled 2023-12-17: qty 6, 28d supply, fill #0

## 2023-12-17 MED ORDER — LANTUS SOLOSTAR 100 UNIT/ML ~~LOC~~ SOPN
24.0000 [IU] | PEN_INJECTOR | Freq: Every day | SUBCUTANEOUS | 3 refills | Status: DC
  Filled 2023-12-17: qty 15, 62d supply, fill #0
  Filled 2023-12-17: qty 9, 37d supply, fill #0
  Filled 2023-12-17: qty 15, 62d supply, fill #0

## 2023-12-17 MED ORDER — INSULIN ASPART 100 UNIT/ML IJ SOLN
6.0000 [IU] | Freq: Once | INTRAMUSCULAR | Status: AC
  Administered 2023-12-17: 6 [IU] via SUBCUTANEOUS

## 2023-12-17 NOTE — H&P (Addendum)
 Date: 12/17/2023               Patient Name:  Jacob [name] MRN: [id number]  DOB: 1991/06/21 Age / Sex: 33 y.o., male   PCP: [name], MD         Medical Service: Internal Medicine Teaching Service         Attending Physician: Dr. Mercie Eon, MD      First Contact: Dr. Lovie Macadamia MD Pager [phone]    Second Contact: Dr. Champ Mungo, DO Pager [phone]         After Hours (After 5p/  First Contact Pager: [phone]  weekends / holidays): Second Contact Pager: [phone]   SUBJECTIVE   Chief Complaint: Nausea, vomiting, abdominal pain  History of Present Illness:  This is a 33 year old male with past medical history of type 1 diabetes, bipolar disorder who presents to the emergency department with concerns of nausea, vomiting, abdominal pain over the past 24 hours.  He states that he normally takes insulin 30 units long-acting and 7 units short acting 3 times daily with meals but over the past 5 days he has been without his insulin.  He states he ran out, and unable to afford refills.  He states yesterday he developed some abdominal pain, nausea, vomiting and felt very sleepy.  He denies any sick contacts, shortness of breath, or cough.  He states that since he has been feeling bad he has returned to the emergency department for evaluation.  On my exam, he states he is feeling very sleepy, but his vomiting is stopped.  He also endorses that his nausea has improved.  He no longer has any abdominal pain.  Upon further questioning, he denies any polyuria or polydipsia.  He does report that he is trying to stay hydrated.  Unfortunately he is homeless and has trouble affording his medications.   ED Course: Patient presented to the emergency department with initial vital sign showing afebrile, pulse 91, respirations 18, blood pressure 128/78 satting at 100% on room air.  Patient had labs consistent with DKA and was started on an insulin drip.  IM[facility] was subsequently consulted for  admission.  Past Medical History Past Medical History:  Diagnosis Date   Bipolar 1 disorder (HCC)    History of attempted suicide 2019   Unsuccessful suicide attempt in 2019 with rat poison   Schizoaffective disorder (HCC)    Type 1 diabetes mellitus on insulin therapy (HCC) 2019     Meds:  Lantus: 30 units daily NovoLog 7 units 3 times daily with meals  Past Surgical History  History reviewed. No pertinent surgical history.  Social:  Lives Unhomed - states he goes in between shelters Occupation: Unemployed Support: No support at this time Level of Function: Independent in all ADLs and IADLs PCP: [name], MD Substances: Occasional tobacco use, no longer using marijuana or cocaine, no alcohol use  Family History:  None reported by patient   Allergies: Allergies as of 12/16/2023   (No Known Allergies)    Review of Systems: A complete ROS was negative except as per HPI.   OBJECTIVE:   Physical Exam: Blood pressure 122/81, pulse 73, temperature 98.4 F (36.9 C), temperature source Oral, resp. rate 10, height 5\' 9"  (1.753 m), weight 65 kg, SpO2 100%.  Constitutional: tired-appearing man attempting to sleep in bed, in no acute distress HENT: normocephalic atraumatic, mucous membranes moist Eyes: conjunctiva non-erythematous Neck: supple Cardiovascular: regular rate and rhythm, no m/r/g Pulmonary/Chest: normal work of breathing  on room air, lungs clear to auscultation bilaterally Abdominal: soft, non-tender, non-distended MSK: normal bulk and tone. Blister on R foot small toe. Neurological: alert & oriented x 3  Skin: warm and dry Psych: Normal mood and affect  Labs: CBC    Component Value Date/Time   WBC 7.4 12/16/2023 1820   RBC 5.03 12/16/2023 1820   HGB 16.3 12/17/2023 0106   HCT 48.0 12/17/2023 0106   PLT 458 (H) 12/16/2023 1820   MCV 87.7 12/16/2023 1820   MCH 29.0 12/16/2023 1820   MCHC 33.1 12/16/2023 1820   RDW 11.9 12/16/2023 1820    LYMPHSABS 1.9 12/16/2023 1820   MONOABS 0.4 12/16/2023 1820   EOSABS 0.0 12/16/2023 1820   BASOSABS 0.1 12/16/2023 1820     CMP     Component Value Date/Time   NA 131 (L) 12/17/2023 0106   NA 131 (L) 11/06/2018 1736   K 4.4 12/17/2023 0106   CL 92 (L) 12/16/2023 1939   CO2 19 (L) 12/16/2023 1939   GLUCOSE 430 (H) 12/16/2023 1939   BUN 22 (H) 12/16/2023 1939   BUN 17 11/06/2018 1736   CREATININE 1.27 (H) 12/16/2023 1939   CALCIUM 9.6 12/16/2023 1939   PROT 7.5 12/16/2023 1939   ALBUMIN 4.5 12/16/2023 1939   AST 28 12/16/2023 1939   ALT 41 12/16/2023 1939   ALKPHOS 94 12/16/2023 1939   BILITOT 1.4 (H) 12/16/2023 1939   GFRNONAA >60 12/16/2023 1939   GFRAA >60 07/28/2020 2100    Imaging: N/A   EKG: N/A   ASSESSMENT & PLAN:   Assessment & Plan by Problem: Principal Problem:   DKA (diabetic ketoacidosis) (HCC) Active Problems:   Homeless   Jacob [name] is a 33 y.o. male with past medical history of type 1 diabetes mellitus who presents with concerns of nausea, vomiting, abdominal pain and found to be in DKA.  Patient admitted for further evaluation management of DKA.  DKA Type 1 diabetes mellitus Patient has a past medical history of type 1 diabetes mellitus.  His home regimen includes Lantus 30 units daily as well as NovoLog 7 units with meals 3 times daily.  Unfortunately, patient has been out of his insulin for the past 5 days.  He has been unable to afford his insulin, and therefore not been taking it.  Unfortunately, he presents to the ED today with concerns of 24 hours of nausea, vomiting, and abdominal pain.  Labs consistent with DKA with increased anion gap, decreased bicarb.  Do not think this is related to infection or infarction.  This is likely in the setting of missing insulin.  Fortunately, pH is 7.3 so likely is early DKA.  Will start insulin drip.   Low suspicion for pancreatitis, but would like to rule out still.  Will admit to progressive unit.  Most recent  A1c in December 2024 showing 11.1. -Continue insulin infusion -Trend BMP every 4 hours -Once gap closes x 2, can transition to subcutaneous insulin -Continue D5 LR infusion -Consult to diabetic coordinator -Admit to progressive unit  Prerenal AKI Patient had elevated creatinine 1.27.  Baseline seems to be around 0.8.  This is likely in setting of volume depletion in the setting of DKA. -Volume resuscitate -Follow-up BMP  Housing instability Patient has history of housing instability, and has recently been to the emergency department on 12/14/2023 for place to sleep.  He has been to multiple shelters.  Difficult situation. -Will consult social work to see if they can provide him  some help  Diet: NPO VTE: DOAC IVF: D5LR Code: Full  Prior to Admission Living Arrangement: Homeless Anticipated Discharge Location:  Shelter  Barriers to Discharge: Clinical improvement  Dispo: Admit patient to Observation with expected length of stay less than 2 midnights.  Signed: Katheran James, DO Internal Medicine Resident PGY-1 Pager: [phone] 12/17/2023, 3:28 AM   On weekends or after 5pm please page on call intern or resident: First contact: [phone] If no answer in 15 minutes, please contact senior pager at [phone]
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
## 2023-12-17 NOTE — ED Notes (Signed)
 CCMD called.

## 2023-12-17 NOTE — Discharge Instructions (Signed)
 You were hospitalized for diabetic ketoacidosis. Thank you for allowing [place] to be part of your care.   We arranged for you to follow up at:  Follow-up Information     [employer] transportation Follow up.   Why: [facility] [employer] members with [name] can call [phone] to schedule non-emergency medical transportation [facility]). Members can also request transportation through their Care Manager.  Scheduling NEMT  Call two days in advance, but no more than 30 days before the appointment        [facility]. Go on 12/22/2023.   Why: Please see [place] in clinic on 12/22/2023 at 2:15PM.                Please take your insulin as prescribed and see [place] in clinic so we may continue to work on getting you the medications you need.    Please call our clinic if you have any questions or concerns, we may be able to help and keep you from a long and expensive emergency room wait. Our clinic and after hours phone number is [phone], the best time to call is Monday through Friday 9 am to 4 pm but there is always someone available 24/7 if you have an emergency. If you need medication refills please notify your pharmacy one week in advance and they will send [place] a request.

## 2023-12-17 NOTE — ED Notes (Signed)
 Attending MD at bedside.

## 2023-12-17 NOTE — ED Provider Notes (Signed)
 [place] EMERGENCY DEPARTMENT AT [facility] Provider Note  CSN: [id number] Arrival date & time: 12/16/23 1624  Chief Complaint(s) No chief complaint on file.  HPI Jacob [name] is a 33 y.o. male with a past medical history listed below including schizoaffective disorder, bipolar 1 disorder, type 1 diabetes on insulin who presents to the emergency department for elevated blood sugar levels.  Patient is unsure how long he has been out of medication.  States that his pain "a minute."  And when asked to elaborate, he reports several weeks.  Patient also states that his CGM is not working and states that it has been on for over 20 days.  He endorses nausea without emesis.  Denies any abdominal pain.  No other physical complaints.  The history is provided by the patient.    Past Medical History Past Medical History:  Diagnosis Date  . Bipolar 1 disorder (HCC)   . History of attempted suicide 2019   Unsuccessful suicide attempt in 2019 with rat poison  . Schizoaffective disorder (HCC)   . Type 1 diabetes mellitus on insulin therapy (HCC) 2019   Patient Active Problem List   Diagnosis Date Noted  . Influenza A 11/30/2023  . DKA (diabetic ketoacidosis) (HCC) 11/30/2023  . DKA, type 1 (HCC) 10/23/2023  . Uncontrolled type 1 diabetes mellitus with hyperglycemia, with long-term current use of insulin (HCC) 10/10/2023  . Blister (nonthermal), right foot, initial encounter 08/01/2023  . Generalized anxiety disorder 08/31/2022  . Tobacco dependence 08/31/2022  . Schizoaffective disorder, manic type (HCC) 12/03/2021  . Uncontrolled type 1 diabetes mellitus with hypoglycemia without coma (HCC) 06/06/2020  . Polycythemia 03/04/2020  . MDD (major depressive disorder), recurrent, severe, with psychosis (HCC)    Home Medication(s) Prior to Admission medications   Medication Sig Start Date End Date Taking? Authorizing Provider  Continuous Glucose Sensor (DEXCOM G7 SENSOR) MISC Place new  sensor every 10 days. Use to continuously monitor blood sugar. 11/16/23   Katheran James, DO  glucose blood (ACCU-CHEK GUIDE TEST) test strip Use up to 3 strips a day when not wearing Continuous glucose monitor 11/16/23   Katheran James, DO  insulin aspart (NOVOLOG FLEXPEN) 100 UNIT/ML FlexPen Inject 7 Units into the skin 3 (three) times daily before meals. 12/15/23   [name], MD  Insulin Disposable Pump (OMNIPOD 5 DEXG7G6 PODS GEN 5) MISC Use to administer up to 50 units, change pods every 2 days 11/10/23   [name], MD  insulin glargine (LANTUS SOLOSTAR) 100 UNIT/ML Solostar Pen Inject 24 units into the skin daily. 12/15/23 12/14/24  [name], MD  Insulin Pen Needle (BD PEN NEEDLE NANO 2ND GEN) 32G X 4 MM MISC Use to inject insulin up to 4 times a day 11/16/23   Katheran James, DO  Insulin Pen Needle (BD PEN NEEDLE NANO U/F) 32G X 4 MM MISC Use  3  times daily with insulin. 11/15/23   Chauncey Mann, DO  Lancet Device MISC 1 each by Does not apply route 3 (three) times daily. May dispense any manufacturer covered by patient's insurance. 10/24/23   Morrie Sheldon, MD  Allergies Patient has no known allergies.  Review of Systems Review of Systems As noted in HPI  Physical Exam Vital Signs  I have reviewed the triage vital signs BP 122/81   Pulse 73   Temp 98.4 F (36.9 C) (Oral)   Resp 10   Ht 5\' 9"  (1.753 m)   Wt 65 kg   SpO2 100%   BMI 21.16 kg/m   Physical Exam Vitals reviewed.  Constitutional:      General: He is not in acute distress.    Appearance: He is well-developed. He is not diaphoretic.  HENT:     Head: Normocephalic and atraumatic.     Right Ear: External ear normal.     Left Ear: External ear normal.     Nose: Nose normal.     Mouth/Throat:     Mouth: Mucous membranes are moist.  Eyes:     General: No scleral  icterus.    Conjunctiva/sclera: Conjunctivae normal.  Neck:     Trachea: Phonation normal.  Cardiovascular:     Rate and Rhythm: Normal rate and regular rhythm.  Pulmonary:     Effort: Pulmonary effort is normal. No respiratory distress.     Breath sounds: No stridor.  Abdominal:     General: There is no distension.     Tenderness: There is no abdominal tenderness.  Musculoskeletal:        General: Normal range of motion.       Arms:     Cervical back: Normal range of motion.  Neurological:     Mental Status: He is alert and oriented to person, place, and time.  Psychiatric:        Behavior: Behavior normal.     ED Results and Treatments Labs (all labs ordered are listed, but only abnormal results are displayed) Labs Reviewed  CBC WITH DIFFERENTIAL/PLATELET - Abnormal; Notable for the following components:      Result Value   Platelets 458 (*)    All other components within normal limits  URINALYSIS, ROUTINE W REFLEX MICROSCOPIC - Abnormal; Notable for the following components:   Color, Urine STRAW (*)    Glucose, UA >=500 (*)    Ketones, ur 80 (*)    All other components within normal limits  COMPREHENSIVE METABOLIC PANEL - Abnormal; Notable for the following components:   Sodium 130 (*)    Chloride 92 (*)    CO2 19 (*)    Glucose, Bld 430 (*)    BUN 22 (*)    Creatinine, Ser 1.27 (*)    Total Bilirubin 1.4 (*)    Anion gap 19 (*)    All other components within normal limits  BETA-HYDROXYBUTYRIC ACID - Abnormal; Notable for the following components:   Beta-Hydroxybutyric Acid 6.87 (*)    All other components within normal limits  CBG MONITORING, ED - Abnormal; Notable for the following components:   Glucose-Capillary 471 (*)    All other components within normal limits  CBG MONITORING, ED - Abnormal; Notable for the following components:   Glucose-Capillary 332 (*)    All other components within normal limits  I-STAT VENOUS BLOOD GAS, ED - Abnormal; Notable  for the following components:   pCO2, Ven 36.0 (*)    pO2, Ven 101 (*)    Bicarbonate 18.6 (*)    TCO2 20 (*)    Acid-base deficit 7.0 (*)    Sodium 131 (*)    Calcium, Ion 1.14 (*)    All other components  within normal limits  CBG MONITORING, ED - Abnormal; Notable for the following components:   Glucose-Capillary 257 (*)    All other components within normal limits  BETA-HYDROXYBUTYRIC ACID  BETA-HYDROXYBUTYRIC ACID  BETA-HYDROXYBUTYRIC ACID  BETA-HYDROXYBUTYRIC ACID                                                                                                                         EKG  EKG Interpretation Date/Time:    Ventricular Rate:    PR Interval:    QRS Duration:    QT Interval:    QTC Calculation:   R Axis:      Text Interpretation:         Radiology No results found.  Medications Ordered in ED Medications  insulin regular, human (MYXREDLIN) 100 units/ 100 mL infusion (5.5 Units/hr Intravenous Rate/Dose Change 12/17/23 0147)  lactated ringers infusion ( Intravenous New Bag/Given 12/17/23 0053)  dextrose 5 % in lactated ringers infusion (0 mLs Intravenous Hold 12/17/23 0108)  dextrose 50 % solution 0-50 mL (has no administration in time range)  potassium chloride 10 mEq in 100 mL IVPB (10 mEq Intravenous Transfusing/Transfer 12/17/23 0148)  lactated ringers bolus 1,234 mL (1,234 mLs Intravenous New Bag/Given 12/17/23 0101)   Procedures .Critical Care  Performed by: Nira Conn, MD Authorized by: Nira Conn, MD   Critical care provider statement:    Critical care time (minutes):  30   Critical care was necessary to treat or prevent imminent or life-threatening deterioration of the following conditions:  Endocrine crisis   Critical care was time spent personally by me on the following activities:  Development of treatment plan with patient or surrogate, discussions with consultants, evaluation of patient's response to treatment,  examination of patient, ordering and review of laboratory studies, ordering and review of radiographic studies, ordering and performing treatments and interventions, pulse oximetry, re-evaluation of patient's condition and review of old charts   Care discussed with: admitting provider     (including critical care time) Medical Decision Making / ED Course   Medical Decision Making Amount and/or Complexity of Data Reviewed Labs: ordered. Decision-making details documented in ED Course.  Risk Prescription drug management.    Hyperglycemia with evidence of DKA on labs.  Patient started on Endo tool.  Consulting internal medicine for evaluation and admission. Clinical Course as of 12/17/23 0250  Sat Dec 17, 2023  0250 Spoke to Dr. Allena Katz from internal medicine who will admit patient. [PC]    Clinical Course User Index [PC] Jesika Men, Amadeo Garnet, MD    Final Clinical Impression(s) / ED Diagnoses Final diagnoses:  Type 1 diabetes mellitus with ketoacidosis without coma (HCC)    This chart was dictated using voice recognition software.  Despite best efforts to proofread,  errors can occur which can change the documentation meaning.    Nira Conn, MD 12/17/23 [phone]
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
## 2023-12-19 ENCOUNTER — Other Ambulatory Visit (HOSPITAL_COMMUNITY): Payer: Self-pay

## 2023-12-22 ENCOUNTER — Encounter: Payer: MEDICAID | Admitting: Student

## 2023-12-22 ENCOUNTER — Encounter: Payer: MEDICAID | Admitting: Dietician

## 2023-12-23 ENCOUNTER — Inpatient Hospital Stay (HOSPITAL_COMMUNITY)
Admission: EM | Admit: 2023-12-23 | Discharge: 2023-12-26 | DRG: 638 | Disposition: A | Discharge status: Other Acute Inpatient Hospital | Payer: MEDICAID | Attending: Internal Medicine | Admitting: Internal Medicine

## 2023-12-23 ENCOUNTER — Other Ambulatory Visit: Payer: Self-pay

## 2023-12-23 DIAGNOSIS — Z79899 Other long term (current) drug therapy: Secondary | ICD-10-CM | POA: Diagnosis not present

## 2023-12-23 DIAGNOSIS — E875 Hyperkalemia: Secondary | ICD-10-CM | POA: Diagnosis present

## 2023-12-23 DIAGNOSIS — E1065 Type 1 diabetes mellitus with hyperglycemia: Secondary | ICD-10-CM | POA: Diagnosis present

## 2023-12-23 DIAGNOSIS — F17210 Nicotine dependence, cigarettes, uncomplicated: Secondary | ICD-10-CM | POA: Diagnosis present

## 2023-12-23 DIAGNOSIS — E109 Type 1 diabetes mellitus without complications: Secondary | ICD-10-CM

## 2023-12-23 DIAGNOSIS — Z9151 Personal history of suicidal behavior: Secondary | ICD-10-CM | POA: Diagnosis not present

## 2023-12-23 DIAGNOSIS — N179 Acute kidney failure, unspecified: Secondary | ICD-10-CM | POA: Diagnosis present

## 2023-12-23 DIAGNOSIS — F1210 Cannabis abuse, uncomplicated: Secondary | ICD-10-CM | POA: Diagnosis present

## 2023-12-23 DIAGNOSIS — Z5900 Homelessness unspecified: Secondary | ICD-10-CM | POA: Diagnosis not present

## 2023-12-23 DIAGNOSIS — F259 Schizoaffective disorder, unspecified: Secondary | ICD-10-CM | POA: Diagnosis not present

## 2023-12-23 DIAGNOSIS — R64 Cachexia: Secondary | ICD-10-CM | POA: Diagnosis present

## 2023-12-23 DIAGNOSIS — Z681 Body mass index (BMI) 19 or less, adult: Secondary | ICD-10-CM

## 2023-12-23 DIAGNOSIS — F250 Schizoaffective disorder, bipolar type: Secondary | ICD-10-CM | POA: Diagnosis present

## 2023-12-23 DIAGNOSIS — Z91128 Patient's intentional underdosing of medication regimen for other reason: Secondary | ICD-10-CM | POA: Diagnosis not present

## 2023-12-23 DIAGNOSIS — E1010 Type 1 diabetes mellitus with ketoacidosis without coma: Secondary | ICD-10-CM | POA: Diagnosis present

## 2023-12-23 DIAGNOSIS — T383X6A Underdosing of insulin and oral hypoglycemic [antidiabetic] drugs, initial encounter: Secondary | ICD-10-CM | POA: Diagnosis present

## 2023-12-23 DIAGNOSIS — E10649 Type 1 diabetes mellitus with hypoglycemia without coma: Secondary | ICD-10-CM

## 2023-12-23 LAB — ETHANOL: Alcohol, Ethyl (B): <10 mg/dL (ref <10)

## 2023-12-23 LAB — BASIC METABOLIC PANEL
Anion gap: 34 — ABNORMAL HIGH (ref 5–15)
Anion gap: 23 — ABNORMAL HIGH (ref 5–15)
Anion gap: 14 (ref 5–15)
BUN: 35 mg/dL — ABNORMAL HIGH (ref 6–20)
BUN: 33 mg/dL — ABNORMAL HIGH (ref 6–20)
BUN: 27 mg/dL — ABNORMAL HIGH (ref 6–20)
CO2: 10 mmol/L — ABNORMAL LOW (ref 22–32)
CO2: 13 mmol/L — ABNORMAL LOW (ref 22–32)
CO2: 25 mmol/L (ref 22–32)
Calcium: 10 mg/dL (ref 8.9–10.3)
Calcium: 9.3 mg/dL (ref 8.9–10.3)
Calcium: 9.9 mg/dL (ref 8.9–10.3)
Chloride: 86 mmol/L — ABNORMAL LOW (ref 98–111)
Chloride: 103 mmol/L (ref 98–111)
Chloride: 103 mmol/L (ref 98–111)
Creatinine, Ser: 2.21 mg/dL — ABNORMAL HIGH (ref 0.61–1.24)
Creatinine, Ser: 1.8 mg/dL — ABNORMAL HIGH (ref 0.61–1.24)
Creatinine, Ser: 1.24 mg/dL (ref 0.61–1.24)
GFR, Estimated: 40 mL/min — ABNORMAL LOW (ref >60)
GFR, Estimated: 51 mL/min — ABNORMAL LOW (ref >60)
GFR, Estimated: >60 mL/min (ref >60)
Glucose, Bld: 766 mg/dL (ref 70–99)
Glucose, Bld: 371 mg/dL — ABNORMAL HIGH (ref 70–99)
Glucose, Bld: 213 mg/dL — ABNORMAL HIGH (ref 70–99)
Potassium: 6.2 mmol/L — ABNORMAL HIGH (ref 3.5–5.1)
Potassium: 4.2 mmol/L (ref 3.5–5.1)
Potassium: 4.4 mmol/L (ref 3.5–5.1)
Sodium: 130 mmol/L — ABNORMAL LOW (ref 135–145)
Sodium: 139 mmol/L (ref 135–145)
Sodium: 142 mmol/L (ref 135–145)

## 2023-12-23 LAB — RAPID URINE DRUG SCREEN, HOSP PERFORMED
Amphetamines: NOT DETECTED
Barbiturates: NOT DETECTED
Benzodiazepines: NOT DETECTED
Cocaine: NOT DETECTED
Opiates: NOT DETECTED
Tetrahydrocannabinol: NOT DETECTED

## 2023-12-23 LAB — I-STAT VENOUS BLOOD GAS, ED
Acid-base deficit: 16 mmol/L — ABNORMAL HIGH (ref 0.0–2.0)
Bicarbonate: 10.8 mmol/L — ABNORMAL LOW (ref 20.0–28.0)
Calcium, Ion: 1.11 mmol/L — ABNORMAL LOW (ref 1.15–1.40)
HCT: 53 % — ABNORMAL HIGH (ref 39.0–52.0)
Hemoglobin: 18 g/dL — ABNORMAL HIGH (ref 13.0–17.0)
O2 Saturation: 92 %
Potassium: 6 mmol/L — ABNORMAL HIGH (ref 3.5–5.1)
Sodium: 125 mmol/L — ABNORMAL LOW (ref 135–145)
TCO2: 12 mmol/L — ABNORMAL LOW (ref 22–32)
pCO2, Ven: 28.8 mm[Hg] — ABNORMAL LOW (ref 44–60)
pH, Ven: 7.181 — CL (ref 7.25–7.43)
pO2, Ven: 79 mm[Hg] — ABNORMAL HIGH (ref 32–45)

## 2023-12-23 LAB — URINALYSIS, ROUTINE W REFLEX MICROSCOPIC
Bacteria, UA: NONE SEEN
Bilirubin Urine: NEGATIVE
Glucose, UA: >=500 mg/dL — AB
Ketones, ur: 80 mg/dL — AB
Leukocytes,Ua: NEGATIVE
Nitrite: NEGATIVE
Protein, ur: 30 mg/dL — AB
Specific Gravity, Urine: 1.022 (ref 1.005–1.030)
pH: 5 (ref 5.0–8.0)

## 2023-12-23 LAB — CBC
HCT: 51.7 % (ref 39.0–52.0)
Hemoglobin: 16.9 g/dL (ref 13.0–17.0)
MCH: 29.1 pg (ref 26.0–34.0)
MCHC: 32.7 g/dL (ref 30.0–36.0)
MCV: 89.1 fL (ref 80.0–100.0)
Platelets: 464 10*3/uL — ABNORMAL HIGH (ref 150–400)
RBC: 5.8 MIL/uL (ref 4.22–5.81)
RDW: 12.1 % (ref 11.5–15.5)
WBC: 10.7 10*3/uL — ABNORMAL HIGH (ref 4.0–10.5)
nRBC: 0 % (ref 0.0–0.2)

## 2023-12-23 LAB — I-STAT CHEM 8, ED
BUN: 38 mg/dL — ABNORMAL HIGH (ref 6–20)
Calcium, Ion: 1.08 mmol/L — ABNORMAL LOW (ref 1.15–1.40)
Chloride: 100 mmol/L (ref 98–111)
Creatinine, Ser: 1.4 mg/dL — ABNORMAL HIGH (ref 0.61–1.24)
Glucose, Bld: >700 mg/dL (ref 70–99)
HCT: 55 % — ABNORMAL HIGH (ref 39.0–52.0)
Hemoglobin: 18.7 g/dL — ABNORMAL HIGH (ref 13.0–17.0)
Potassium: 6 mmol/L — ABNORMAL HIGH (ref 3.5–5.1)
Sodium: 128 mmol/L — ABNORMAL LOW (ref 135–145)
TCO2: 12 mmol/L — ABNORMAL LOW (ref 22–32)

## 2023-12-23 LAB — CBG MONITORING, ED
Glucose-Capillary: >600 mg/dL (ref 70–99)
Glucose-Capillary: 472 mg/dL — ABNORMAL HIGH (ref 70–99)
Glucose-Capillary: 355 mg/dL — ABNORMAL HIGH (ref 70–99)
Glucose-Capillary: 486 mg/dL — ABNORMAL HIGH (ref 70–99)

## 2023-12-23 LAB — GLUCOSE, CAPILLARY
Glucose-Capillary: 247 mg/dL — ABNORMAL HIGH (ref 70–99)
Glucose-Capillary: 206 mg/dL — ABNORMAL HIGH (ref 70–99)
Glucose-Capillary: 199 mg/dL — ABNORMAL HIGH (ref 70–99)
Glucose-Capillary: 182 mg/dL — ABNORMAL HIGH (ref 70–99)

## 2023-12-23 LAB — LIPASE, BLOOD: Lipase: 19 U/L (ref 11–51)

## 2023-12-23 LAB — BETA-HYDROXYBUTYRIC ACID: Beta-Hydroxybutyric Acid: >8 mmol/L — ABNORMAL HIGH (ref 0.05–0.27)

## 2023-12-23 MED ORDER — LACTATED RINGERS IV BOLUS
20.0000 mL/kg | Freq: Once | INTRAVENOUS | Status: AC
  Administered 2023-12-23: 1300 mL via INTRAVENOUS

## 2023-12-23 MED ORDER — DEXTROSE 50 % IV SOLN: 0.0000 mL | INTRAVENOUS | Status: DC | PRN

## 2023-12-23 MED ORDER — INSULIN REGULAR(HUMAN) IN NACL 100-0.9 UT/100ML-% IV SOLN
INTRAVENOUS | Status: DC
  Administered 2023-12-23: 7.5 [IU]/h via INTRAVENOUS
  Filled 2023-12-23: qty 100

## 2023-12-23 MED ORDER — LACTATED RINGERS IV SOLN: INTRAVENOUS | Status: DC

## 2023-12-23 MED ORDER — ONDANSETRON HCL 4 MG/2ML IJ SOLN
4.0000 mg | Freq: Once | INTRAMUSCULAR | Status: AC
  Administered 2023-12-23: 4 mg via INTRAVENOUS
  Filled 2023-12-23: qty 2

## 2023-12-23 MED ORDER — ENOXAPARIN SODIUM 40 MG/0.4ML IJ SOSY
40.0000 mg | PREFILLED_SYRINGE | INTRAMUSCULAR | Status: DC
  Administered 2023-12-23 – 2023-12-25 (×2): 40 mg via SUBCUTANEOUS
  Filled 2023-12-23 (×3): qty 0.4

## 2023-12-23 MED ORDER — DEXTROSE IN LACTATED RINGERS 5 % IV SOLN: INTRAVENOUS | Status: DC

## 2023-12-23 NOTE — ED Notes (Signed)
 Assumed care of patient with insulin infusion running at 4.4 units per hour. Titrated per endotool when CBG was due per previous RN

## 2023-12-23 NOTE — ED Provider Notes (Signed)
 [place] EMERGENCY DEPARTMENT AT [facility] Provider Note   CSN: [id number] Arrival date & time: 12/23/23  1152     History  Chief Complaint  Patient presents with   Hyperglycemia   Emesis    Jacob [name] is a 33 y.o. male history of schizoaffective disorder, type 1 diabetes, DKA, MDD, homeless presented for emesis and hyperglycemia.  Patient does not know what is post be on for his diabetes and states he has been "a minute" since he is taking his meds.  When asked how long he has been vomiting patient states again "it has been a minute" he cannot say whether has been days months or weeks.  Patient does have multiple visits and was recently admitted for DKA.   Home Medications Prior to Admission medications   Medication Sig Start Date End Date Taking? Authorizing Provider  Continuous Glucose Sensor (DEXCOM G7 SENSOR) MISC Place new sensor every 10 days. Use to continuously monitor blood sugar. 11/16/23   Katheran Aicha Clingenpeel, DO  glucose blood (ACCU-CHEK GUIDE TEST) test strip Use up to 3 strips a day when not wearing Continuous glucose monitor 11/16/23   Katheran Aby Gessel, DO  Insulin Disposable Pump (OMNIPOD 5 DEXG7G6 PODS GEN 5) MISC Use to administer up to 50 units, change pods every 2 days Patient not taking: Reported on 12/17/2023 11/10/23   [name], MD  insulin glargine (LANTUS SOLOSTAR) 100 UNIT/ML Solostar Pen Inject 24 units into the skin daily. Patient not taking: Reported on 12/17/2023 12/17/23 12/16/24  Lovie Macadamia, MD  insulin lispro (HUMALOG) 100 UNIT/ML KwikPen Inject 7 Units into the skin 3 (three) times daily before meals. 12/17/23   Lovie Macadamia, MD  Insulin Pen Needle (BD PEN NEEDLE NANO 2ND GEN) 32G X 4 MM MISC Use to inject insulin up to 4 times a day 11/16/23   Katheran Josiel Gahm, DO  Insulin Pen Needle (BD PEN NEEDLE NANO U/F) 32G X 4 MM MISC Use  3  times daily with insulin. 11/15/23   Chauncey Mann, DO  Lancet Device MISC 1 each by Does  not apply route 3 (three) times daily. May dispense any manufacturer covered by patient's insurance. 10/24/23   Morrie Sheldon, MD      Allergies    Patient has no known allergies.    Review of Systems   Review of Systems  Gastrointestinal:  Positive for vomiting.    Physical Exam Updated Vital Signs BP (!) 141/94   Pulse 100   Temp 97.6 F (36.4 C) (Oral)   Resp 18   SpO2 98%  Physical Exam Vitals reviewed.  Constitutional:      General: He is in acute distress.     Appearance: He is ill-appearing.     Comments: Cachectic looking  HENT:     Head: Normocephalic and atraumatic.     Mouth/Throat:     Mouth: Mucous membranes are dry.  Eyes:     Extraocular Movements: Extraocular movements intact.     Conjunctiva/sclera: Conjunctivae normal.     Pupils: Pupils are equal, round, and reactive to light.  Cardiovascular:     Rate and Rhythm: Normal rate and regular rhythm.     Pulses: Normal pulses.     Heart sounds: Normal heart sounds.     Comments: 2+ bilateral radial/dorsalis pedis pulses with regular rate Pulmonary:     Effort: Pulmonary effort is normal. No respiratory distress.     Breath sounds: Normal breath sounds.  Abdominal:  Palpations: Abdomen is soft.     Tenderness: There is no abdominal tenderness. There is no guarding or rebound.  Musculoskeletal:        General: Normal range of motion.     Cervical back: Normal range of motion and neck supple.     Comments: 5 out of 5 bilateral grip/leg extension strength  Skin:    General: Skin is warm and dry.     Capillary Refill: Capillary refill takes 2 to 3 seconds.  Neurological:     General: No focal deficit present.     Mental Status: He is alert and oriented to person, place, and time.     Comments: Sensation intact in all 4 limbs  Psychiatric:        Mood and Affect: Mood normal.     ED Results / Procedures / Treatments   Labs (all labs ordered are listed, but only abnormal results are  displayed) Labs Reviewed  CBC - Abnormal; Notable for the following components:      Result Value   WBC 10.7 (*)    Platelets 464 (*)    All other components within normal limits  CBG MONITORING, ED - Abnormal; Notable for the following components:   Glucose-Capillary >600 (*)    All other components within normal limits  I-STAT CHEM 8, ED - Abnormal; Notable for the following components:   Sodium 128 (*)    Potassium 6.0 (*)    BUN 38 (*)    Creatinine, Ser 1.40 (*)    Glucose, Bld >700 (*)    Calcium, Ion 1.08 (*)    TCO2 12 (*)    Hemoglobin 18.7 (*)    HCT 55.0 (*)    All other components within normal limits  I-STAT VENOUS BLOOD GAS, ED - Abnormal; Notable for the following components:   pH, Ven 7.181 (*)    pCO2, Ven 28.8 (*)    pO2, Ven 79 (*)    Bicarbonate 10.8 (*)    TCO2 12 (*)    Acid-base deficit 16.0 (*)    Sodium 125 (*)    Potassium 6.0 (*)    Calcium, Ion 1.11 (*)    HCT 53.0 (*)    Hemoglobin 18.0 (*)    All other components within normal limits  URINALYSIS, ROUTINE W REFLEX MICROSCOPIC  BETA-HYDROXYBUTYRIC ACID  LIPASE, BLOOD  BASIC METABOLIC PANEL  BASIC METABOLIC PANEL  BASIC METABOLIC PANEL  BASIC METABOLIC PANEL  CBG MONITORING, ED    EKG None  Radiology No results found.  Procedures .Critical Care  Performed by: Netta Corrigan, [name] Authorized by: Netta Corrigan, [name]   Critical care provider statement:    Critical care time (minutes):  30   Critical care time was exclusive of:  Separately billable procedures and treating other patients   Critical care was necessary to treat or prevent imminent or life-threatening deterioration of the following conditions:  Metabolic crisis   Critical care was time spent personally by me on the following activities:  Blood draw for specimens, development of treatment plan with patient or surrogate, discussions with consultants, evaluation of patient's response to treatment, examination of patient,  obtaining history from patient or surrogate, review of old charts, re-evaluation of patient's condition, pulse oximetry, ordering and review of radiographic studies, ordering and review of laboratory studies and ordering and performing treatments and interventions   I assumed direction of critical care for this patient from another provider in my specialty: no  Care discussed with: admitting provider       Medications Ordered in ED Medications  lactated ringers bolus 1,300 mL (has no administration in time range)  insulin regular, human (MYXREDLIN) 100 units/ 100 mL infusion (has no administration in time range)  lactated ringers infusion (has no administration in time range)  dextrose 5 % in lactated ringers infusion (has no administration in time range)  dextrose 50 % solution 0-50 mL (has no administration in time range)    ED Course/ Medical Decision Making/ A&P                                 Medical Decision Making Amount and/or Complexity of Data Reviewed Labs: ordered.  Risk Prescription drug management. Decision regarding hospitalization.   Jacob [name] 33 y.o. presented today for hyperglycemia.  Working DDx that I considered at this time includes, but not limited to, hyperglycemia, DKA/HHS, electrolyte abnormalities, occult infection, euglycemic DKA, sepsis.  R/o DDx: hyperglycemia, HHS, electrolyte abnormalities, occult infection, euglycemic DKA, sepsis: These are considered less likely due to history of present illness, physical exam, labs/imaging findings  Review of prior external notes: 12/17/2023 discharge summary  Unique Tests and My Independent Interpretation:  CBG: Greater than 600 CBC: Mild leukocytosis 10.7 CMP: Pending Lipase: Pending Beta-hydroxybutyrate: Pending I-STAT Chem-8: Hyperglycemia greater than 700, hyperkalemia 6.0 VBG: Acidotic 7.181, bicarb 10.8; metabolic acidosis UA: Pending  Social Determinants of Health: homeless  Discussion with  Independent Historian: None  Discussion of Management of Tests:  Masters, PGY-3  Risk: High: hospitalization or escalation of hospital-level care  Risk Stratification Score: None  Plan: On exam patient was acute distress and ill-appearing.  Patient's glucose upon arrival was greater than 700.  Patient does have history of diabetes and uses insulin.  Patient's diabetic medications include glargine, lispro and last use of them unknown as patient states has been "a minute".  Patient is ill-appearing and labs in triage do show significant DKA and so we will start fluids and insulin and admit to hospitalist.  I spoke to the IM resident and patient was accepted for admission.  Patient stable to be admitted.  This chart was dictated using voice recognition software.  Despite best efforts to proofread,  errors can occur which can change the documentation meaning.         Final Clinical Impression(s) / ED Diagnoses Final diagnoses:  Diabetic ketoacidosis without coma associated with type 1 diabetes mellitus [facility])    Rx / DC Orders ED Discharge Orders     None         Remi Deter 12/23/23 1509    Tegeler, Canary Brim, MD 12/24/23 [phone]

## 2023-12-23 NOTE — ED Triage Notes (Signed)
 Pt with hx DM1 here for eval of emesis x 3 today after not taking his insulin for a week. Patient lethargic in triage.
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
## 2023-12-23 NOTE — ED Notes (Signed)
 Next CBG due 1916

## 2023-12-23 NOTE — Hospital Course (Addendum)
 Jacob [name] is a 33 y.o. male with past medical history of schizoaffective disorder, T1DM, who presents with emesis and hyperglycemia and was admitted for DKA. During this admission, he has been found to be responding to internal stimuli and will discharge to [facility]. His DKA quickly resolved with IV fluids and insulin. He was successfully transitioned to subcutaneous insulin. He will obtain further treatment at [facility] after this.   DKA Type 1 diabetes Hyperkalemia - Resolved AMS Gap is persistently closed. Blood sugars a bit elevated today. On Lantus 24 units daily, increased to 10 units short acting with meals.   AKI - Resolved Resolved with fluids   History of suicide attempt History of Schizophrenia  Evaluated by psychiatry team - appreciate their input. Will begin risperidone 1mg  BID and monitor for response. Inpatient behavioral health recommended as patient is a responding to internal stimuli and threat to self due to repeat episodes of medication non-compliance and hospitalization which are felt to be in large part due to his psychiatric co-morbidities. He is more cooperative, affect less constricted. Discussion with psych team today - appreciate their help. He is willing to go to [name] voluntarily and will seek treatment to help break the cycle he is stuck in.

## 2023-12-23 NOTE — Progress Notes (Signed)
 Pt arrived to floor from ED. Pt very lethargic and oriented X1. BG taken and 535. Insulin gtt changed and next BG check due at [facility]

## 2023-12-23 NOTE — ED Provider Triage Note (Signed)
 Emergency Medicine Provider Triage Evaluation Note  Laurier Melone , a 33 y.o. male  was evaluated in triage.  Pt complains of emesis and hyperglycemia.  Patient does not know what is post be on for his diabetes and states he has been "a minute" since he is taking his meds.  When asked how long he has been vomiting patient states again "it has been a minute" he cannot say whether has been days months or weeks.  Patient does have multiple visits and was recently admitted for DKA.  Review of Systems  Positive:  Negative:   Physical Exam  BP (!) 141/94   Pulse 100   Temp 97.6 F (36.4 C) (Oral)   Resp 18   SpO2 98%  Gen:   Awake, in distress, cachectic Resp:  Normal effort  MSK:   Moves extremities without difficulty  Other:    Medical Decision Making  Medically screening exam initiated at 2:03 PM.  Appropriate orders placed.  Yuriel Wiebelhaus was informed that the remainder of the evaluation will be completed by another provider, this initial triage assessment does not replace that evaluation, and the importance of remaining in the ED until their evaluation is complete.  Workup initiated, suspect patient is in DKA given recent admissions along with having sugar greater than 700 and appearing ill, triage nurse notified the patient will need to come back next.   Netta Corrigan, [name] 12/23/23 [phone]

## 2023-12-23 NOTE — H&P (Signed)
 Date: 12/23/2023               Patient Name:  Jacob [name] MRN: [id number]  DOB: 1990-11-30 Age / Sex: 33 y.o., male   PCP: [name], MD         Medical Service: Internal Medicine Teaching Service         Attending Physician: Dr. Reymundo Poll, MD      First Contact: Dr. Lovie Macadamia MD Pager [phone]    Second Contact: Dr. Champ Mungo, DO Pager [phone]         After Hours (After 5p/  First Contact Pager: [phone]  weekends / holidays): Second Contact Pager: [phone]   SUBJECTIVE   Chief Complaint: vomiting  History of Present Illness:  Jacob [name] is a 33 year old with past medical history of bipolar 1 disorder, ketosis-prone type 2 diabetes, who presents with emesis and hyperglycemia. On exam, he opens eyes to voice and is able to state name and where he is located.  Not much additional history able to be gathered from patient.  He was recently admitted to [facility]MT[facility] 2/15 for DKA in setting of medication nonadherence and was discharged that day after anion gap closed and he was transition to subcutaneous insulin.  I called and talked with his mother and grandmother.  His family Corrie Dandy and [place]) got him a hotel room for 2 nights because of the cold weather. His mother said when she saw him yesterday he was acting ok, but he called her this morning and asked that she come pick him up and take him to the emergency room. When she got to him this morning he started throwing up in the car on the way to the ED.   Corrie Dandy and [place] both help [name] as they are able to. They are not sure if he has been taking insulin.  They are frustrated and at a loss for what to do to help him as he refuses to take medications.  They ask if he could be admitted to the behavioral health unit for additional help.  ED Course: Blood sugars greater than 700 Beta hydroxybutyric acid greater than 8 VBG with pH is 7.1, bicarb of 10 K 6.2 Creatinine 2.21 Lipase 19  Past Medical History Ketosis-prone  type 1 diabetes Bipolar 1 disorder  Meds:  Current Meds  Medication Sig   insulin glargine (LANTUS SOLOSTAR) 100 UNIT/ML Solostar Pen Inject 24 units into the skin daily. (Patient taking differently: Inject 30 Units into the skin daily.)   insulin lispro (HUMALOG) 100 UNIT/ML KwikPen Inject 7 Units into the skin 3 (three) times daily before meals.    Social:  Lives With: unhomed Support: [place] and Corrie Dandy are both trying to help him as much as possible, they are frustrated as he refuses to take medications PCP: [facility] Substances: Occasional tobacco use, no longer using marijuana or cocaine, no alcohol use   Allergies: Allergies as of 12/23/2023   (No Known Allergies)    Review of Systems: A complete ROS was negative except as per HPI.   OBJECTIVE:   Physical Exam: Blood pressure (!) 141/94, pulse 100, temperature 97.6 F (36.4 C), temperature source Oral, resp. rate 18, SpO2 98%.  Constitutional: Ill-appearing, opens eyes to voice, cachectic, sweating Cardiovascular: Sinus tachycardia Pulmonary/Chest: Tachypnea, lungs clear to auscultation anteriorly Abdominal: soft, non-tender, non-distended, scaphoid Neurological: Opens eyes to voice and is able to follow some commands, is oriented to self and place only Skin: warm and dry  Labs: CBC  Component Value Date/Time   WBC 10.7 (H) 12/23/2023 1342   RBC 5.80 12/23/2023 1342   HGB 18.0 (H) 12/23/2023 1408   HCT 53.0 (H) 12/23/2023 1408   PLT 464 (H) 12/23/2023 1342   MCV 89.1 12/23/2023 1342   MCH 29.1 12/23/2023 1342   MCHC 32.7 12/23/2023 1342   RDW 12.1 12/23/2023 1342   LYMPHSABS 1.9 12/16/2023 1820   MONOABS 0.4 12/16/2023 1820   EOSABS 0.0 12/16/2023 1820   BASOSABS 0.1 12/16/2023 1820     CMP     Component Value Date/Time   NA 125 (L) 12/23/2023 1408   NA 131 (L) 11/06/2018 1736   K 6.0 (H) 12/23/2023 1408   CL 100 12/23/2023 1354   CO2 25 12/17/2023 1102   GLUCOSE >700 (HH) 12/23/2023 1354   BUN 38 (H)  12/23/2023 1354   BUN 17 11/06/2018 1736   CREATININE 1.40 (H) 12/23/2023 1354   CALCIUM 8.7 (L) 12/17/2023 1102   PROT 7.5 12/16/2023 1939   ALBUMIN 4.5 12/16/2023 1939   AST 28 12/16/2023 1939   ALT 41 12/16/2023 1939   ALKPHOS 94 12/16/2023 1939   BILITOT 1.4 (H) 12/16/2023 1939   GFRNONAA >60 12/17/2023 1102   GFRAA >60 07/28/2020 2100   VBG pH of 7.1 with bicarb of 10 Beta hydroxy greater than 8   EKG: pending  ASSESSMENT & PLAN:   Assessment & Plan by Problem: Principal Problem:   DKA, type 1 (HCC)   Jacob [name] is a 33 y.o. person living with a history of ketosis-prone type 1 diabetes, bipolar 1 disorder, homelessness who presented with emesis and confusion and admitted for DKA on hospital day 0  DKA Type 1 diabetes Unable to get full history from patient.  Family provides collateral information and it sounds like he has not been taking his insulin since being out of the hospital last Saturday.  His mom states that he seemed well yesterday but then this morning called her and asked to go to the hospital.  His labs in the emergency room were significant for glucose greater than 700 with bicarb of 10, anion gap of 34, VBG with pH of 7.1, beta hydroxy greater than 8 consistent with DKA.  His mentation is very confused and I would like night team to check on him this evening to see if this improves after some treatment for DKA.  For differentials this is likely in setting of medication nonadherence as his family reports that he frequently loses his insulin pens.  He was provided with insulin on day of discharge last Saturday.  He does not have a leukocytosis and I do not see any external signs of infection on his skin.  Would like to confirm review of systems once he is more awake and alert.  -Admit to progressive unit -Insulin drip -Giving IV fluids -BMP every 4 hours, anion gap needs to close x 2 and patient be awake and alert to eat -N.p.o.  Hyperkalemia Potassium  elevated at 6.2 on admission.  EKG pending.  -If EKG changes then will treat with hyperglycemic emergency medications including insulin, albuterol, calcium gluconate  AKI Baseline creatinine 0.8-1.  Creatinine elevated at 2.21 with BUN of 35.  This is in setting of DKA so is prerenal with hypovolemia.  -Given bolus per DKA order set -LR infusion -Trending BMP with DKA  History of suicide attempt Noted on past medical history.  Will need to ask about this once patient's mentation is improved.  Diet: NPO  VTE: Enoxaparin IVF: LR,125cc/hr Code: Full  Prior to Admission Living Arrangement: Homeless Barriers to Discharge: DKA  Dispo: Admit patient to Inpatient with expected length of stay greater than 2 midnights.  Signed: Rudene Christians, DO Internal Medicine Resident PGY-3  12/23/2023, 4:04 PM

## 2023-12-24 ENCOUNTER — Encounter (HOSPITAL_COMMUNITY): Payer: Self-pay | Admitting: Internal Medicine

## 2023-12-24 DIAGNOSIS — F250 Schizoaffective disorder, bipolar type: Secondary | ICD-10-CM | POA: Diagnosis not present

## 2023-12-24 DIAGNOSIS — E1010 Type 1 diabetes mellitus with ketoacidosis without coma: Secondary | ICD-10-CM | POA: Diagnosis not present

## 2023-12-24 LAB — GLUCOSE, CAPILLARY
Glucose-Capillary: 145 mg/dL — ABNORMAL HIGH (ref 70–99)
Glucose-Capillary: 168 mg/dL — ABNORMAL HIGH (ref 70–99)
Glucose-Capillary: 179 mg/dL — ABNORMAL HIGH (ref 70–99)
Glucose-Capillary: 187 mg/dL — ABNORMAL HIGH (ref 70–99)
Glucose-Capillary: 193 mg/dL — ABNORMAL HIGH (ref 70–99)
Glucose-Capillary: 193 mg/dL — ABNORMAL HIGH (ref 70–99)
Glucose-Capillary: 203 mg/dL — ABNORMAL HIGH (ref 70–99)
Glucose-Capillary: 203 mg/dL — ABNORMAL HIGH (ref 70–99)
Glucose-Capillary: 303 mg/dL — ABNORMAL HIGH (ref 70–99)
Glucose-Capillary: 340 mg/dL — ABNORMAL HIGH (ref 70–99)
Glucose-Capillary: 354 mg/dL — ABNORMAL HIGH (ref 70–99)

## 2023-12-24 LAB — BASIC METABOLIC PANEL
Anion gap: 12 (ref 5–15)
Anion gap: 9 (ref 5–15)
BUN: 25 mg/dL — ABNORMAL HIGH (ref 6–20)
BUN: 24 mg/dL — ABNORMAL HIGH (ref 6–20)
CO2: 27 mmol/L (ref 22–32)
CO2: 28 mmol/L (ref 22–32)
Calcium: 9.9 mg/dL (ref 8.9–10.3)
Calcium: 9.4 mg/dL (ref 8.9–10.3)
Chloride: 104 mmol/L (ref 98–111)
Chloride: 106 mmol/L (ref 98–111)
Creatinine, Ser: 1.12 mg/dL (ref 0.61–1.24)
Creatinine, Ser: 1.02 mg/dL (ref 0.61–1.24)
GFR, Estimated: >60 mL/min (ref >60)
GFR, Estimated: >60 mL/min (ref >60)
Glucose, Bld: 226 mg/dL — ABNORMAL HIGH (ref 70–99)
Glucose, Bld: 209 mg/dL — ABNORMAL HIGH (ref 70–99)
Potassium: 4.4 mmol/L (ref 3.5–5.1)
Potassium: 4 mmol/L (ref 3.5–5.1)
Sodium: 143 mmol/L (ref 135–145)
Sodium: 143 mmol/L (ref 135–145)

## 2023-12-24 LAB — BETA-HYDROXYBUTYRIC ACID: Beta-Hydroxybutyric Acid: 0.38 mmol/L — ABNORMAL HIGH (ref 0.05–0.27)

## 2023-12-24 MED ORDER — INSULIN ASPART 100 UNIT/ML IJ SOLN
0.0000 [IU] | Freq: Three times a day (TID) | INTRAMUSCULAR | Status: DC
  Administered 2023-12-24 (×2): 7 [IU] via SUBCUTANEOUS
  Administered 2023-12-24: 5 [IU] via SUBCUTANEOUS
  Administered 2023-12-25: 9 [IU] via SUBCUTANEOUS
  Administered 2023-12-25: 7 [IU] via SUBCUTANEOUS
  Administered 2023-12-25: 5 [IU] via SUBCUTANEOUS
  Administered 2023-12-26: 7 [IU] via SUBCUTANEOUS

## 2023-12-24 MED ORDER — RISPERIDONE 1 MG/ML PO SOLN
1.0000 mg | Freq: Two times a day (BID) | ORAL | Status: DC
  Administered 2023-12-24: 1 mg via ORAL
  Filled 2023-12-24 (×2): qty 1

## 2023-12-24 MED ORDER — INSULIN ASPART 100 UNIT/ML IJ SOLN: 0.0000 [IU] | Freq: Every day | INTRAMUSCULAR | Status: DC

## 2023-12-24 MED ORDER — BENZTROPINE MESYLATE 1 MG PO TABS
1.0000 mg | ORAL_TABLET | Freq: Every day | ORAL | Status: DC
  Administered 2023-12-24 – 2023-12-25 (×2): 1 mg via ORAL
  Filled 2023-12-24 (×3): qty 1

## 2023-12-24 MED ORDER — INSULIN GLARGINE 100 UNIT/ML ~~LOC~~ SOLN
24.0000 [IU] | Freq: Every day | SUBCUTANEOUS | Status: DC
  Administered 2023-12-24 – 2023-12-26 (×3): 24 [IU] via SUBCUTANEOUS
  Filled 2023-12-24 (×3): qty 0.24

## 2023-12-24 MED ORDER — INSULIN ASPART 100 UNIT/ML IJ SOLN
5.0000 [IU] | Freq: Three times a day (TID) | INTRAMUSCULAR | Status: DC
  Administered 2023-12-25: 5 [IU] via SUBCUTANEOUS

## 2023-12-24 MED ORDER — RISPERIDONE 1 MG PO TABS
1.0000 mg | ORAL_TABLET | Freq: Two times a day (BID) | ORAL | Status: DC
  Administered 2023-12-25 – 2023-12-26 (×3): 1 mg via ORAL
  Filled 2023-12-24 (×4): qty 1

## 2023-12-24 NOTE — Inpatient Diabetes Management (Signed)
 Inpatient Diabetes Program Recommendations  AACE/ADA: New Consensus Statement on Inpatient Glycemic Control (2015)  Target Ranges:  Prepandial:   less than 140 mg/dL      Peak postprandial:   less than 180 mg/dL (1-2 hours)      Critically ill patients:  140 - 180 mg/dL   Lab Results  Component Value Date   GLUCAP 340 (H) 12/24/2023   HGBA1C 11.4 (H) 10/23/2023    Review of Glycemic Control  Latest Reference Range & Units 12/24/23 09:24 12/24/23 11:28 12/24/23 12:44  Glucose-Capillary 70 - 99 mg/dL 161 (H) 096 (H) 045 (H)  Diabetes history: Type 1 DM  Outpatient Diabetes medications:  Humalog 7 units tid with meals Lantus 24 units daily Dexcom G7  Hospital meds:  Novolog 0-9 units tid with meals and HS Semglee 24 units daily  Inpatient Diabetes Program Recommendations:    Consider adding Novolog 3 units tid with meals (hold if patient eats less than 50% or NPO).  Note Psych consult pending.   Thanks,  Lorenza Cambridge, RN, BC-ADM Inpatient Diabetes Coordinator Pager [phone]  (8a-5p)

## 2023-12-24 NOTE — Plan of Care (Signed)
  Problem: Coping: Goal: Ability to adjust to condition or change in health will improve Outcome: Progressing   Problem: Metabolic: Goal: Ability to maintain appropriate glucose levels will improve Outcome: Progressing   Problem: Respiratory: Goal: Will regain and/or maintain adequate ventilation Outcome: Progressing

## 2023-12-24 NOTE — Consult Note (Signed)
 [name] Psychiatric Consult Initial  Patient Name: .Johnel Yielding  MRN: [id number]  DOB: 09-23-1991  Consult Order details:  Orders (From admission, onward)     Start     Ordered   12/24/23 0931  IP CONSULT TO PSYCHIATRY       Comments: 33 y.o. gentleman with history of T1DM, homelessness, documented bipolar disorder, piror suicide attempt. Multiple admission for DKA. Patient may be intentionally withholding insulin to induce DKA. In a addition endorses paranoid delusions. Denies active SI, HI. Would like to assess if he qualifies for inpatient treatment. Non-compliant outpatient.  Ordering Provider: Lovie Macadamia, MD  Provider:  (Not yet assigned)  Question Answer Comment  Location [facility]   Reason for Consult? Assessment for [facility] admission      12/24/23 0935             Mode of Visit: In person    Psychiatry Consult Evaluation  Service Date: December 24, 2023 LOS:  LOS: 1 day  Chief Complaint "Nothing is wrong with me"   Primary Psychiatric Diagnoses  Schizoaffective disorder, Bipolar type-History 2.  Cannabis use disorder    Assessment  Jo Nanna is a 33 y.o. male admitted: Medicallyfor 12/23/2023  1:00 PM for DKA. He carries the psychiatric diagnoses of Schizoaffective disorder, Bipolar type and Cannabis use disorder and has a past medical history of  T1DM and Multiple admission for DKA.    His current presentation of paranoid delusion and auditory hallucination is most consistent with his history of schizoaffective disorder. He meets criteria for inpatient admission due to ongoing psychotic symptoms and medication non compliance. He appears to be a danger to himself and others due to medication non complaince. Current outpatient psychotropic medications include Invega sustenna 156 mg q 4 weeks (last received on 02/16/2023), and Cogentin 1 mg daily for EPS. Historically patient has not been complaint with these medications. On initial  examination, patient appears disheveled, with poor hygiene. He is dismissive, uncooperative and evasive. His mood is dysphoric with constricted affect. He appears to be internally preoccupied and responding to internal stimuli. Please see plan below for detailed recommendations.   Diagnoses:  Active Hospital problems: Principal Problem:   DKA, type 1 (HCC) Active Problems:   Schizoaffective disorder, manic type (HCC)   Uncontrolled type 1 diabetes mellitus with hyperglycemia, with long-term current use of insulin (HCC)   Homeless    Plan   ## Psychiatric Medication Recommendations:  Consider Risperidone 1 mg twice daily for psychosis with plan to up titrate as needed.  Cogentin 1 mg at bedtime for EPS Consider LAI invega sustenna once patient is able to tolerate Risperidone.   Consider psychiatric inpatient transfer once he is medically cleared Consider TOC/Social worker consult to facilitate transfer.   ## Medical Decision Making Capacity: Not specifically addressed in this encounter  ## Further Work-up:  -- most recent EKG on 12/23/23 had QtC of 426 -- Pertinent labwork reviewed earlier this admission includes: Utox-THC positive, BUN-24.    ## Disposition:-- We recommend inpatient psychiatric hospitalization when medically cleared. Patient is under voluntary admission status at this time; please IVC if attempts to leave hospital.  ## Behavioral / Environmental: - No specific recommendations at this time.     ## Safety and Observation Level:  - Based on my clinical evaluation, I estimate the patient to be at moderate risk of self harm in the current setting. - At this time, we recommend  routine. This decision is based on my review  of the chart including patient's history and current presentation, interview of the patient, mental status examination, and consideration of suicide risk including evaluating suicidal ideation, plan, intent, suicidal or self-harm behaviors, risk factors,  and protective factors. This judgment is based on our ability to directly address suicide risk, implement suicide prevention strategies, and develop a safety plan while the patient is in the clinical setting. Please contact our team if there is a concern that risk level has changed.  CSSR Risk Category:C-SSRS RISK CATEGORY: No Risk  Suicide Risk Assessment: Patient has following modifiable risk factors for suicide: untreated depression, social isolation, recklessness, and medication noncompliance, which we are addressing by administering medications. Patient has following non-modifiable or demographic risk factors for suicide: male gender and psychiatric hospitalization Patient has the following protective factors against suicide: Frustration tolerance  Thank you for this consult request. Recommendations have been communicated to the primary team.  We will continue to follow at this time.   Fredonia Highland, MD       History of Present Illness  Relevant Aspects of [facility] Course:  Admitted on 12/23/2023 for management of DKA.   Patient Report:  Patient seen face to face in his hospital room. He is awake and alert, but patient is dismissive, uncooperative and refused to answer most questions asked. Patient reports ongoing auditory hallucinations but refused to elaborate when asked to talk more about the voices. He appears internally preoccupied and responding to internal stimuli by talking to self. Patient appears to be minimizing symptoms and reports he has never seen a psychiatrist in his entire life and has never taken any psychotropic medications, however, review of EMR shows that patient was previously on LAI Invega sustenna 156 mg q 4 weeks, last received in April 2024. He was also on Abilify Maintenna 400 mg q 4 weeks in the past before he was switched to [employer]  Patient denies use of any illicit substance but his urine toxicology is positive for THC on 11/30/23. He denies  current suicidal or homicidal ideation, intent or plan.   Information obtained from the patient's treating nurse indicates that patient has been talking gibberish 'living the witness protection program" and paranoid about someone trying to kill him.    Psych ROS:  Depression: appears depressed but refused to answer  Anxiety:  denies  Mania (lifetime and current): denies  Psychosis: (lifetime and current): Patient reports auditory hallucinations and paranoid delusion. He appears to be internally preoccupied.   Collateral information:  Patient refused to give consent for collateral .   Review of Systems  Psychiatric/Behavioral:  Positive for hallucinations and substance abuse. Negative for suicidal ideas. The patient is not nervous/anxious.      Psychiatric and Social History  Psychiatric History:  Information collected from the patient   Prev Dx/Sx: schizoaffective disorder, Bipolar type.  Current Psych Provider: none  Home Meds (current): none  Previous Med Trials: Abilify 15 mg daily, Abilify Maintenna 400 mg q 4 weeks, Invega sustenna 156 mg q 4 weeks.  Therapy: denies   Prior Psych Hospitalization: refused to answer   Prior Self Harm:  refused to answer   Prior Violence:  refused to answer    Family Psych History:  refused to answer   Family Hx suicide:  refused to answer    Social History:     Educational Hx:  refused to answer   Occupational Hx:  refused to answer   Legal Hx:  refused to answer   Living Situation:  Homeless Spiritual Hx:  refused to answer   Access to weapons/lethal means: patient denies    Substance History Alcohol:  patient denies  Type of alcohol N/A Last Drink N/A Number of drinks per day N/A History of alcohol withdrawal seizures: refused to answer   History of DT's  refused to answer   Tobacco:  refused to answer   Illicit drugs: THC refused to answer   Prescription drug abuse:  Rehab hx:  refused to answer    Exam Findings  Physical  Exam:  Vital Signs:  Temp:  [97.7 F (36.5 C)-99.5 F (37.5 C)] 98.7 F (37.1 C) (02/22 1126) Pulse Rate:  [75-117] 75 (02/22 1200) Resp:  [9-18] 14 (02/22 1200) BP: (120-164)/(79-104) 164/100 (02/22 1200) SpO2:  [95 %-98 %] 98 % (02/22 1200) Weight:  [50.2 kg] 50.2 kg (02/21 1848) Blood pressure (!) 164/100, pulse 75, temperature 98.7 F (37.1 C), temperature source Axillary, resp. rate 14, height 5\' 9"  (1.753 m), weight 50.2 kg, SpO2 98%. Body mass index is 16.34 kg/m.  Physical Exam  Mental Status Exam: General Appearance: Disheveled  Orientation:  Other:  UNKNOWN   Memory:  Immediate;   Fair  Concentration:  Concentration: Fair  Recall:  Fair  Attention  Fair  Eye Contact:  Poor  Speech:   selectively mute  Language:   unnown   Volume:  Decreased  Mood: "refused to anser"  Affect:  Constricted and Flat  Thought Process:  Disorganized and Irrelevant  Thought Content:  Delusions and Hallucinations: Auditory  Suicidal Thoughts:  No  Homicidal Thoughts:  No  Judgement:  Poor  Insight:  Lacking  Psychomotor Activity:  Decreased  Akathisia:  No  Fund of Knowledge:  Poor      Assets:  Communication Skills  Cognition:  WNL  ADL's:  Intact  AIMS (if indicated):        Other History   These have been pulled in through the EMR, reviewed, and updated if appropriate.  Family History:  The patient's family history includes Healthy in his father and mother.  Medical History: Past Medical History:  Diagnosis Date   Bipolar 1 disorder (HCC)    History of attempted suicide 2019   Unsuccessful suicide attempt in 2019 with rat poison   Schizoaffective disorder (HCC)    Type 1 diabetes mellitus on insulin therapy (HCC) 2019    Surgical History: History reviewed. No pertinent surgical history.   Medications:   Current Facility-Administered Medications:    dextrose 50 % solution 0-50 mL, 0-50 mL, Intravenous, PRN, Schuman, James T, [name]   enoxaparin (LOVENOX)  injection 40 mg, 40 mg, Subcutaneous, Q24H, Masters, Katie, DO, 40 mg at 12/23/23 1751   insulin aspart (novoLOG) injection 0-5 Units, 0-5 Units, Subcutaneous, QHS, Champ Mungo, DO   insulin aspart (novoLOG) injection 0-9 Units, 0-9 Units, Subcutaneous, TID WC, Champ Mungo, DO, 7 Units at 12/24/23 1245   insulin glargine (LANTUS) injection 24 Units, 24 Units, Subcutaneous, Daily, Champ Mungo, DO, 24 Units at 12/24/23 1610  Allergies: No Known Allergies  Skylor Hughson Jenita Seashore, MD

## 2023-12-24 NOTE — Progress Notes (Signed)
 HD#1 Subjective:   Summary: Jacob [name] is a 33 y.o. male with past medical history of bipolar 1 disorder, ketosis-prone type 2 diabetes, who presents with emesis and hyperglycemia. On exam, he opens eyes to voice and is able to state name and where he is located.  Not much additional history able to be gathered from patient.  Overnight Events: Progressively less lethargic.    Interviewed this AM. Agitated at times, alert and oriented. No new symptoms. Denies fevers, chill, cough, congestion, SOB, abd pain. Patient hungry and would like PO.   Objective:  Vital signs in last 24 hours: Vitals:   12/23/23 2300 12/24/23 0000 12/24/23 0337 12/24/23 0751  BP: (!) 137/99 (!) 143/97 (!) 136/96 (!) 140/101  Pulse: 90 88 85   Resp: 10 10 12    Temp: 97.7 F (36.5 C)  97.7 F (36.5 C) 98.5 F (36.9 C)  TempSrc: Axillary  Axillary Oral  SpO2: 97% 97% 95%   Weight:      Height:       Supplemental O2: Room Air SpO2: 95 %   Physical Exam:  Cardiovascular: regular rate and rhythm, no m/r/g Pulmonary/Chest: normal work of breathing on room air, lungs clear to auscultation bilaterally Abdominal: soft, non-tender, non-distended, positive bowel sounds Skin: warm and dry Psych: Agitated at times.   Filed Weights   12/23/23 1848  Weight: 50.2 kg      Intake/Output Summary (Last 24 hours) at 12/24/2023 0939 Last data filed at 12/24/2023 0456 Gross per 24 hour  Intake 0 ml  Output 0 ml  Net 0 ml   Net IO Since Admission: 0 mL [12/24/23 0939]  Pertinent Labs:    Latest Ref Rng & Units 12/23/2023    2:08 PM 12/23/2023    1:54 PM 12/23/2023    1:42 PM  CBC  WBC 4.0 - 10.5 K/uL   10.7   Hemoglobin 13.0 - 17.0 g/dL 16.1  09.6  04.5   Hematocrit 39.0 - 52.0 % 53.0  55.0  51.7   Platelets 150 - 400 K/uL   464        Latest Ref Rng & Units 12/24/2023    6:53 AM 12/24/2023    1:56 AM 12/23/2023   10:41 PM  CMP  Glucose 70 - 99 mg/dL 409  811  914   BUN 6 - 20 mg/dL 24  25  27     Creatinine 0.61 - 1.24 mg/dL 7.82  9.56  2.13   Sodium 135 - 145 mmol/L 143  143  142   Potassium 3.5 - 5.1 mmol/L 4.0  4.4  4.4   Chloride 98 - 111 mmol/L 106  104  103   CO2 22 - 32 mmol/L 28  27  25    Calcium 8.9 - 10.3 mg/dL 9.4  9.9  9.9     Assessment/Plan:   Principal Problem:   DKA, type 1 (HCC)   DKA Type 1 diabetes Hyperkalemia - Resolved AMS Mentation improved this AM. Alert and oriented. He is hungry. Gap resolved x3. Beta hydroxy butyrate pending. Patient requesting food this AM, diet ordered. We will see how he tolerates PO. Agitated when asked about why he was unable to take his insulin. States it is his mothers fault for feeing him or due to added sugars in foods.   AKI - Resolved Baseline creatinine 0.8-1.  Creatinine elevated at 2.21 with BUN of 35. Improved with fluids   History of suicide attempt History of Schizophrenia  Noted  on past medical history. Patient agitated at times during interview today. He declines to elaborate when asked questions regarding some of his thoughts and behaviors. He states that his mom is the reason for his currently situation and also blames all the added sugars in foods. He does endorse being sent home with insulin in hand last discharge, but declines to state why he did not take it. He says he has issues with medications being stolen from him. Per nursing he has been endorsing paranoid delusions such as living the witness protection program. Denies SI or HI at this time. I will ask psychiatry to assess this patient. I certainly do believe they would benefit from psychiatric treatment, as this is likely a large contributor to their repeat presentations.   Diet: Carb-Modified IVF: LR,Bolus VTE: Enoxaparin Code: Full  Dispo: Anticipated discharge to Home/ Inpatient psych pending psychiatry evaluation continued resolution of metabolic status.   Lovie Macadamia MD Internal Medicine Resident PGY-1 Pager: [phone] Please  contact the on call pager after 5 pm and on weekends at [phone].

## 2023-12-25 DIAGNOSIS — F250 Schizoaffective disorder, bipolar type: Secondary | ICD-10-CM | POA: Diagnosis not present

## 2023-12-25 LAB — BASIC METABOLIC PANEL
Anion gap: 7 (ref 5–15)
BUN: 15 mg/dL (ref 6–20)
CO2: 28 mmol/L (ref 22–32)
Calcium: 8.7 mg/dL — ABNORMAL LOW (ref 8.9–10.3)
Chloride: 97 mmol/L — ABNORMAL LOW (ref 98–111)
Creatinine, Ser: 0.79 mg/dL (ref 0.61–1.24)
GFR, Estimated: >60 mL/min (ref >60)
Glucose, Bld: 162 mg/dL — ABNORMAL HIGH (ref 70–99)
Potassium: 3.7 mmol/L (ref 3.5–5.1)
Sodium: 132 mmol/L — ABNORMAL LOW (ref 135–145)

## 2023-12-25 LAB — GLUCOSE, CAPILLARY
Glucose-Capillary: 252 mg/dL — ABNORMAL HIGH (ref 70–99)
Glucose-Capillary: 311 mg/dL — ABNORMAL HIGH (ref 70–99)
Glucose-Capillary: 82 mg/dL (ref 70–99)

## 2023-12-25 MED ORDER — INSULIN ASPART 100 UNIT/ML IJ SOLN
7.0000 [IU] | Freq: Three times a day (TID) | INTRAMUSCULAR | Status: DC
  Administered 2023-12-25 – 2023-12-26 (×3): 7 [IU] via SUBCUTANEOUS

## 2023-12-25 NOTE — Plan of Care (Signed)
  Problem: Coping: Goal: Ability to adjust to condition or change in health will improve Outcome: Progressing   Problem: Nutritional: Goal: Maintenance of adequate nutrition will improve Outcome: Progressing   Problem: Metabolic: Goal: Ability to maintain appropriate glucose levels will improve Outcome: Progressing   Problem: Education: Goal: Knowledge of General Education information will improve Description: Including pain rating scale, medication(s)/side effects and non-pharmacologic comfort measures Outcome: Progressing

## 2023-12-25 NOTE — Consult Note (Signed)
 [facility] Psychiatric Consult Follow-up  Patient Name: .Jacob [name]  MRN: [id number]  DOB: [birth year]  Consult Order details:  Orders (From admission, onward)     Start     Ordered   12/24/23 0931  IP CONSULT TO PSYCHIATRY       Comments: 33 y.o. gentleman with history of T1DM, homelessness, documented bipolar disorder, piror suicide attempt. Multiple admission for DKA. Patient may be intentionally withholding insulin to induce DKA. In a addition endorses paranoid delusions. Denies active SI, HI. Would like to assess if he qualifies for inpatient treatment. Non-compliant outpatient.  Ordering Provider: Lovie Macadamia, MD  Provider:  (Not yet assigned)  Question Answer Comment  Location [facility]   Reason for Consult? Assessment for [facility] admission      12/24/23 0935             Mode of Visit: In person    Psychiatry Consult Evaluation  Service Date: December 25, 2023 LOS:  LOS: 2 days  Chief Complaint "Nothing is wrong with me"   Primary Psychiatric Diagnoses  Schizoaffective disorder, Bipolar type-History 2.  Cannabis use disorder    Assessment  [name] is a 33 y.o. male admitted: Medicallyfor 12/23/2023  1:00 PM for DKA. He carries the psychiatric diagnoses of Schizoaffective disorder, Bipolar type and Cannabis use disorder and has a past medical history of  T1DM and Multiple admission for DKA.    His current presentation of paranoid delusion and auditory hallucination is most consistent with his history of schizoaffective disorder. He meets criteria for inpatient admission due to ongoing psychotic symptoms and medication non compliance. He appears to be a danger to himself and others due to medication non complaince. Current outpatient psychotropic medications include Invega sustenna 156 mg q 4 weeks (last received on 02/16/2023), and Cogentin 1 mg daily for EPS. Historically patient has not been complaint with these medications. On initial  examination, patient appears disheveled, with poor hygiene. He is dismissive, uncooperative and evasive. His mood is dysphoric with constricted affect. He appears to be internally preoccupied and responding to internal stimuli. Please see plan below for detailed recommendations.    12/25/2023: Patient seen face to face in his hospital room. He is awake, alert and oriented x 3. Patient remains paranoid accusing his mother of trying to kill him. He reports auditory hallucinations but refused to elaborate on the voices. On exam, patient is disheveled with poor hygiene. He appears hypervigilant and scanning the room.His thought process is disorganized with poor insight and judgement regarding his medical and mental illness. This Clinical research associate encouraged patient to consider transfer to psych inpatient unit once he is medically stabilized for further stabilization, but patient did not give any concrete response and states he would think about it. He denies SI/HI.    Diagnoses:  Active Hospital problems: Principal Problem:   DKA, type 1 (HCC) Active Problems:   Schizoaffective disorder, bipolar type (HCC)   Uncontrolled type 1 diabetes mellitus with hyperglycemia, with long-term current use of insulin (HCC)   Homeless    Plan   ## Psychiatric Medication Recommendations:  Continue Risperidone 1 mg twice daily for psychosis with plan to up titrate as needed.  Continue Cogentin 1 mg at bedtime for EPS Consider LAI invega sustenna once patient is able to tolerate Risperidone.   Consider psychiatric inpatient transfer once he is medically cleared Consider TOC/Social worker consult to facilitate transfer.   ## Medical Decision Making Capacity: Not specifically addressed in this encounter  ##  Further Work-up:  -- most recent EKG on 12/23/23 had QtC of 426 -- Pertinent labwork reviewed earlier this admission includes: Utox-THC positive, BUN-24.    ## Disposition:-- We recommend inpatient psychiatric  hospitalization when medically cleared. Patient is under voluntary admission status at this time; please IVC if attempts to leave hospital.  ## Behavioral / Environmental: - No specific recommendations at this time.     ## Safety and Observation Level:  - Based on my clinical evaluation, I estimate the patient to be at moderate risk of self harm in the current setting. - At this time, we recommend  routine. This decision is based on my review of the chart including patient's history and current presentation, interview of the patient, mental status examination, and consideration of suicide risk including evaluating suicidal ideation, plan, intent, suicidal or self-harm behaviors, risk factors, and protective factors. This judgment is based on our ability to directly address suicide risk, implement suicide prevention strategies, and develop a safety plan while the patient is in the clinical setting. Please contact our team if there is a concern that risk level has changed.  CSSR Risk Category:C-SSRS RISK CATEGORY: No Risk  Suicide Risk Assessment: Patient has following modifiable risk factors for suicide: untreated depression, social isolation, recklessness, and medication noncompliance, which we are addressing by administering medications. Patient has following non-modifiable or demographic risk factors for suicide: male gender and psychiatric hospitalization Patient has the following protective factors against suicide: Frustration tolerance  Thank you for this consult request. Recommendations have been communicated to the primary team.  We will continue to follow at this time.   Fredonia Highland, MD       History of Present Illness  Relevant Aspects of [facility] Course:  Admitted on 12/23/2023 for management of DKA.   Patient Report:  Patient seen face to face in his hospital room. He is awake and alert, but patient is dismissive, uncooperative and refused to answer most questions  asked. Patient reports ongoing auditory hallucinations but refused to elaborate when asked to talk more about the voices. He appears internally preoccupied and responding to internal stimuli by talking to self. Patient appears to be minimizing symptoms and reports he has never seen a psychiatrist in his entire life and has never taken any psychotropic medications, however, review of EMR shows that patient was previously on LAI Invega sustenna 156 mg q 4 weeks, last received in April 2024. He was also on Abilify Maintenna 400 mg q 4 weeks in the past before he was switched to [employer]  Patient denies use of any illicit substance but his urine toxicology is positive for THC on 11/30/23. He denies current suicidal or homicidal ideation, intent or plan.   Information obtained from the patient's treating nurse indicates that patient has been talking gibberish 'living the witness protection program" and paranoid about someone trying to kill him.    Psych ROS:  Depression: appears depressed but refused to answer  Anxiety:  denies  Mania (lifetime and current): denies  Psychosis: (lifetime and current): Patient reports auditory hallucinations and paranoid delusion. He appears to be internally preoccupied.   Collateral information:  Patient refused to give consent for collateral .   Review of Systems  Psychiatric/Behavioral:  Positive for hallucinations and substance abuse. Negative for suicidal ideas. The patient is not nervous/anxious.      Psychiatric and Social History  Psychiatric History:  Information collected from the patient   Prev Dx/Sx: schizoaffective disorder, Bipolar type.  Current  Psych Provider: none  Home Meds (current): none  Previous Med Trials: Abilify 15 mg daily, Abilify Maintenna 400 mg q 4 weeks, Invega sustenna 156 mg q 4 weeks.  Therapy: denies   Prior Psych Hospitalization: refused to answer   Prior Self Harm:  refused to answer   Prior Violence:  refused to  answer    Family Psych History:  refused to answer   Family Hx suicide:  refused to answer    Social History:     Educational Hx:  refused to answer   Occupational Hx:  refused to answer   Legal Hx:  refused to answer   Living Situation: Homeless Spiritual Hx:  refused to answer   Access to weapons/lethal means: patient denies    Substance History Alcohol:  patient denies  Type of alcohol N/A Last Drink N/A Number of drinks per day N/A History of alcohol withdrawal seizures: refused to answer   History of DT's  refused to answer   Tobacco:  refused to answer   Illicit drugs: THC refused to answer   Prescription drug abuse:  Rehab hx:  refused to answer    Exam Findings  Physical Exam:  Vital Signs:  Temp:  [97.6 F (36.4 C)-98.7 F (37.1 C)] 97.6 F (36.4 C) (02/23 1100) Pulse Rate:  [74-84] 84 (02/22 2000) Resp:  [9-18] 18 (02/22 2000) BP: (108-130)/(80-91) 108/80 (02/23 0621) SpO2:  [86 %-99 %] 86 % (02/22 2000) Blood pressure 108/80, pulse 84, temperature 97.6 F (36.4 C), temperature source Oral, resp. rate 18, height 5\' 9"  (1.753 m), weight 50.2 kg, SpO2 (!) 86%. Body mass index is 16.34 kg/m.  Physical Exam  Mental Status Exam: General Appearance: Disheveled  Orientation:  Other:  UNKNOWN   Memory:  Immediate;   Fair  Concentration:  Concentration: Fair  Recall:  Fair  Attention  Fair  Eye Contact:  Poor  Speech:   selectively mute  Language:   unnown   Volume:  Decreased  Mood: "refused to anser"  Affect:  Constricted and Flat  Thought Process:  Disorganized and Irrelevant  Thought Content:  Delusions and Hallucinations: Auditory  Suicidal Thoughts:  No  Homicidal Thoughts:  No  Judgement:  Poor  Insight:  Lacking  Psychomotor Activity:  Decreased  Akathisia:  No  Fund of Knowledge:  Poor      Assets:  Communication Skills  Cognition:  WNL  ADL's:  Intact  AIMS (if indicated):        Other History   These have been pulled in through the  EMR, reviewed, and updated if appropriate.  Family History:  The patient's family history includes Healthy in his father and mother.  Medical History: Past Medical History:  Diagnosis Date   Bipolar 1 disorder (HCC)    History of attempted suicide 2019   Unsuccessful suicide attempt in 2019 with rat poison   Schizoaffective disorder (HCC)    Type 1 diabetes mellitus on insulin therapy (HCC) 2019    Surgical History: History reviewed. No pertinent surgical history.   Medications:   Current Facility-Administered Medications:    benztropine (COGENTIN) tablet 1 mg, 1 mg, Oral, QHS, Atul Delucia A, MD, 1 mg at 12/24/23 2105   dextrose 50 % solution 0-50 mL, 0-50 mL, Intravenous, PRN, Schuman, James T, [name]   enoxaparin (LOVENOX) injection 40 mg, 40 mg, Subcutaneous, Q24H, Masters, Katie, DO, 40 mg at 12/23/23 1751   insulin aspart (novoLOG) injection 0-5 Units, 0-5 Units, Subcutaneous, QHS, Champ Mungo, DO  insulin aspart (novoLOG) injection 0-9 Units, 0-9 Units, Subcutaneous, TID WC, Champ Mungo, DO, 9 Units at 12/25/23 1236   insulin aspart (novoLOG) injection 7 Units, 7 Units, Subcutaneous, TID WC, Lovie Macadamia, MD, 7 Units at 12/25/23 1236   insulin glargine (LANTUS) injection 24 Units, 24 Units, Subcutaneous, Daily, Champ Mungo, DO, 24 Units at 12/25/23 1125   risperiDONE (RISPERDAL) tablet 1 mg, 1 mg, Oral, BID, Reymundo Poll, MD, 1 mg at 12/25/23 1125  Allergies: No Known Allergies  Leann Mayweather Jenita Seashore, MD

## 2023-12-25 NOTE — Progress Notes (Signed)
 HD#2 Subjective:   Summary: Jacob [name] is a 33 y.o. male with past medical history of schizoaffective disorder, T1DM, who presents with emesis and hyperglycemia and was admitted for DKA. During this admission, he has been found to be responding to internal stimuli and will discharge to [facility].   Overnight Events: None  Affect slightly flat but pleasant today. No new symptoms, abdominal pain, NV, fevers, chills. Tolerating PO well. Discussed staying to continue insulin titration.   Objective:  Vital signs in last 24 hours: Vitals:   12/24/23 2000 12/25/23 0621 12/25/23 0751 12/25/23 1100  BP: 130/80 108/80    Pulse: 84     Resp: 18     Temp:   98.7 F (37.1 C) 97.6 F (36.4 C)  TempSrc:  Oral Oral Oral  SpO2: (!) 86%     Weight:      Height:       Supplemental O2: Room Air SpO2: (!) 86 %   Physical Exam:  Cardiovascular: regular rate and rhythm, no m/r/g Pulmonary/Chest: normal work of breathing on room air, lungs clear to auscultation bilaterally Abdominal: soft, non-tender, non-distended, positive bowel sounds Skin: warm and dry Psych: appropriate   Filed Weights   12/23/23 1848  Weight: 50.2 kg      Intake/Output Summary (Last 24 hours) at 12/25/2023 1906 Last data filed at 12/25/2023 1409 Gross per 24 hour  Intake 240 ml  Output 2500 ml  Net -2260 ml   Net IO Since Admission: -19.87 mL [12/25/23 1906]  Pertinent Labs:    Latest Ref Rng & Units 12/23/2023    2:08 PM 12/23/2023    1:54 PM 12/23/2023    1:42 PM  CBC  WBC 4.0 - 10.5 K/uL   10.7   Hemoglobin 13.0 - 17.0 g/dL 40.9  81.1  91.4   Hematocrit 39.0 - 52.0 % 53.0  55.0  51.7   Platelets 150 - 400 K/uL   464        Latest Ref Rng & Units 12/25/2023    2:12 AM 12/24/2023    6:53 AM 12/24/2023    1:56 AM  CMP  Glucose 70 - 99 mg/dL 782  956  213   BUN 6 - 20 mg/dL 15  24  25    Creatinine 0.61 - 1.24 mg/dL 0.86  5.78  4.69   Sodium 135 - 145 mmol/L 132  143  143   Potassium 3.5 - 5.1 mmol/L  3.7  4.0  4.4   Chloride 98 - 111 mmol/L 97  106  104   CO2 22 - 32 mmol/L 28  28  27    Calcium 8.9 - 10.3 mg/dL 8.7  9.4  9.9     Assessment/Plan:   Principal Problem:   DKA, type 1 (HCC) Active Problems:   Schizoaffective disorder, bipolar type (HCC)   Uncontrolled type 1 diabetes mellitus with hyperglycemia, with long-term current use of insulin (HCC)   Homeless   DKA Type 1 diabetes Hyperkalemia - Resolved AMS Gap is persistently closed. Blood sugars a bit elevated today. On Lantus 24 units daily, 7 units with meals plus sliding scale correction. Will continue to titrate insulin regimen with additional readings. Can likely increase insulin regimen tomorrow.    AKI - Resolved Baseline creatinine 0.8-1. Elevated to 2.21 on admission, and now returned to baseline  History of suicide attempt History of Schizophrenia  Evaluated by psychiatry team - appreciate their input. Will begin risperidone 1mg  BID and monitor for response. Inpatient  behavioral health recommended as patient is a responding to internal stimuli and threat to self due to repeat episodes of medication non-compliance and hospitalization which are felt to be in large part due to his psychiatric co-morbidities.  Diet: Carb-Modified IVF: LR,Bolus VTE: Enoxaparin Code: Full  Dispo: Anticipated discharge to Inpatient psych pending bed availability and continued resolution of metabolic abnormalities.   Lovie Macadamia MD Internal Medicine Resident PGY-1 Pager: [phone] Please contact the on call pager after 5 pm and on weekends at [phone].

## 2023-12-25 NOTE — Plan of Care (Signed)
   Problem: Education: Goal: Ability to describe self-care measures that may prevent or decrease complications (Diabetes Survival Skills Education) will improve Outcome: Progressing Goal: Individualized Educational Video(s) Outcome: Progressing

## 2023-12-26 ENCOUNTER — Encounter (HOSPITAL_COMMUNITY): Payer: Self-pay

## 2023-12-26 ENCOUNTER — Other Ambulatory Visit: Payer: Self-pay

## 2023-12-26 ENCOUNTER — Inpatient Hospital Stay (HOSPITAL_COMMUNITY)
Admission: AD | Admit: 2023-12-26 | Discharge: 2024-01-05 | DRG: 885 | Disposition: A | Discharge status: Home / Self Care | Payer: MEDICAID | Source: Intra-hospital | Attending: Psychiatry | Admitting: Psychiatry

## 2023-12-26 DIAGNOSIS — R739 Hyperglycemia, unspecified: Principal | ICD-10-CM

## 2023-12-26 DIAGNOSIS — E1010 Type 1 diabetes mellitus with ketoacidosis without coma: Secondary | ICD-10-CM | POA: Diagnosis not present

## 2023-12-26 DIAGNOSIS — E559 Vitamin D deficiency, unspecified: Secondary | ICD-10-CM | POA: Diagnosis present

## 2023-12-26 DIAGNOSIS — F250 Schizoaffective disorder, bipolar type: Principal | ICD-10-CM | POA: Diagnosis present

## 2023-12-26 DIAGNOSIS — Z5900 Homelessness unspecified: Secondary | ICD-10-CM

## 2023-12-26 DIAGNOSIS — Z604 Social exclusion and rejection: Secondary | ICD-10-CM | POA: Diagnosis present

## 2023-12-26 DIAGNOSIS — Z79899 Other long term (current) drug therapy: Secondary | ICD-10-CM | POA: Diagnosis not present

## 2023-12-26 DIAGNOSIS — Z5941 Food insecurity: Secondary | ICD-10-CM | POA: Diagnosis not present

## 2023-12-26 DIAGNOSIS — F1290 Cannabis use, unspecified, uncomplicated: Secondary | ICD-10-CM | POA: Diagnosis present

## 2023-12-26 DIAGNOSIS — Z91148 Patient's other noncompliance with medication regimen for other reason: Secondary | ICD-10-CM | POA: Diagnosis not present

## 2023-12-26 DIAGNOSIS — F259 Schizoaffective disorder, unspecified: Secondary | ICD-10-CM | POA: Diagnosis not present

## 2023-12-26 DIAGNOSIS — Z91128 Patient's intentional underdosing of medication regimen for other reason: Secondary | ICD-10-CM | POA: Diagnosis not present

## 2023-12-26 DIAGNOSIS — E1065 Type 1 diabetes mellitus with hyperglycemia: Secondary | ICD-10-CM | POA: Diagnosis present

## 2023-12-26 DIAGNOSIS — Z5948 Other specified lack of adequate food: Secondary | ICD-10-CM | POA: Diagnosis not present

## 2023-12-26 DIAGNOSIS — T383X6A Underdosing of insulin and oral hypoglycemic [antidiabetic] drugs, initial encounter: Secondary | ICD-10-CM | POA: Diagnosis present

## 2023-12-26 DIAGNOSIS — Z560 Unemployment, unspecified: Secondary | ICD-10-CM | POA: Diagnosis not present

## 2023-12-26 DIAGNOSIS — E10649 Type 1 diabetes mellitus with hypoglycemia without coma: Secondary | ICD-10-CM | POA: Diagnosis not present

## 2023-12-26 DIAGNOSIS — F419 Anxiety disorder, unspecified: Secondary | ICD-10-CM | POA: Diagnosis present

## 2023-12-26 DIAGNOSIS — Z794 Long term (current) use of insulin: Secondary | ICD-10-CM | POA: Diagnosis not present

## 2023-12-26 DIAGNOSIS — Z9151 Personal history of suicidal behavior: Secondary | ICD-10-CM | POA: Diagnosis not present

## 2023-12-26 DIAGNOSIS — F29 Unspecified psychosis not due to a substance or known physiological condition: Secondary | ICD-10-CM | POA: Diagnosis present

## 2023-12-26 DIAGNOSIS — F17290 Nicotine dependence, other tobacco product, uncomplicated: Secondary | ICD-10-CM | POA: Diagnosis present

## 2023-12-26 LAB — CBC
HCT: 38.4 % — ABNORMAL LOW (ref 39.0–52.0)
Hemoglobin: 13 g/dL (ref 13.0–17.0)
MCH: 29.5 pg (ref 26.0–34.0)
MCHC: 33.9 g/dL (ref 30.0–36.0)
MCV: 87.1 fL (ref 80.0–100.0)
Platelets: 240 10*3/uL (ref 150–400)
RBC: 4.41 MIL/uL (ref 4.22–5.81)
RDW: 11.9 % (ref 11.5–15.5)
WBC: 3.6 10*3/uL — ABNORMAL LOW (ref 4.0–10.5)
nRBC: 0 % (ref 0.0–0.2)

## 2023-12-26 LAB — BASIC METABOLIC PANEL
Anion gap: 7 (ref 5–15)
BUN: 16 mg/dL (ref 6–20)
CO2: 30 mmol/L (ref 22–32)
Calcium: 8.8 mg/dL — ABNORMAL LOW (ref 8.9–10.3)
Chloride: 95 mmol/L — ABNORMAL LOW (ref 98–111)
Creatinine, Ser: 0.85 mg/dL (ref 0.61–1.24)
GFR, Estimated: >60 mL/min (ref >60)
Glucose, Bld: 264 mg/dL — ABNORMAL HIGH (ref 70–99)
Potassium: 4 mmol/L (ref 3.5–5.1)
Sodium: 132 mmol/L — ABNORMAL LOW (ref 135–145)

## 2023-12-26 LAB — GLUCOSE, CAPILLARY
Glucose-Capillary: 332 mg/dL — ABNORMAL HIGH (ref 70–99)
Glucose-Capillary: 343 mg/dL — ABNORMAL HIGH (ref 70–99)
Glucose-Capillary: 360 mg/dL — ABNORMAL HIGH (ref 70–99)
Glucose-Capillary: 514 mg/dL (ref 70–99)
Glucose-Capillary: 594 mg/dL (ref 70–99)

## 2023-12-26 MED ORDER — HYDROXYZINE HCL 10 MG PO TABS
10.0000 mg | ORAL_TABLET | Freq: Three times a day (TID) | ORAL | Status: DC | PRN
  Administered 2023-12-27 – 2024-01-04 (×8): 10 mg via ORAL
  Filled 2023-12-26 (×10): qty 1

## 2023-12-26 MED ORDER — INSULIN LISPRO (1 UNIT DIAL) 100 UNIT/ML (KWIKPEN): 10.0000 [IU] | PEN_INJECTOR | Freq: Three times a day (TID) | SUBCUTANEOUS | Status: DC

## 2023-12-26 MED ORDER — ALUM & MAG HYDROXIDE-SIMETH 200-200-20 MG/5ML PO SUSP: 30.0000 mL | ORAL | Status: DC | PRN

## 2023-12-26 MED ORDER — ACETAMINOPHEN 325 MG PO TABS
650.0000 mg | ORAL_TABLET | Freq: Four times a day (QID) | ORAL | Status: DC | PRN
  Administered 2023-12-31: 650 mg via ORAL
  Filled 2023-12-26: qty 2

## 2023-12-26 MED ORDER — DIPHENHYDRAMINE HCL 25 MG PO CAPS: 50.0000 mg | ORAL_CAPSULE | Freq: Three times a day (TID) | ORAL | Status: DC | PRN

## 2023-12-26 MED ORDER — INSULIN GLARGINE 100 UNIT/ML ~~LOC~~ SOLN: 24.0000 [IU] | Freq: Every day | SUBCUTANEOUS | Status: DC

## 2023-12-26 MED ORDER — INSULIN ASPART 100 UNIT/ML IJ SOLN
10.0000 [IU] | Freq: Once | INTRAMUSCULAR | Status: AC
Start: 2023-12-26 — End: 2023-12-26
  Administered 2023-12-26: 10 [IU] via SUBCUTANEOUS

## 2023-12-26 MED ORDER — DIPHENHYDRAMINE HCL 50 MG/ML IJ SOLN
50.0000 mg | Freq: Three times a day (TID) | INTRAMUSCULAR | Status: DC | PRN
  Administered 2023-12-27 – 2024-01-03 (×2): 50 mg via INTRAMUSCULAR

## 2023-12-26 MED ORDER — DIPHENHYDRAMINE HCL 50 MG/ML IJ SOLN
50.0000 mg | Freq: Three times a day (TID) | INTRAMUSCULAR | Status: DC | PRN
  Filled 2023-12-26 (×2): qty 1

## 2023-12-26 MED ORDER — TRAZODONE HCL 50 MG PO TABS
50.0000 mg | ORAL_TABLET | Freq: Every evening | ORAL | Status: DC | PRN
  Administered 2023-12-27 – 2024-01-04 (×7): 50 mg via ORAL
  Filled 2023-12-26 (×10): qty 1

## 2023-12-26 MED ORDER — MAGNESIUM HYDROXIDE 400 MG/5ML PO SUSP: 30.0000 mL | Freq: Every day | ORAL | Status: DC | PRN

## 2023-12-26 MED ORDER — HALOPERIDOL LACTATE 5 MG/ML IJ SOLN
5.0000 mg | Freq: Three times a day (TID) | INTRAMUSCULAR | Status: DC | PRN
  Administered 2023-12-27 – 2024-01-03 (×2): 5 mg via INTRAMUSCULAR
  Filled 2023-12-26: qty 1

## 2023-12-26 MED ORDER — INSULIN ASPART 100 UNIT/ML IJ SOLN
0.0000 [IU] | Freq: Every day | INTRAMUSCULAR | Status: DC
  Administered 2023-12-26: 5 [IU] via SUBCUTANEOUS
  Administered 2023-12-27: 4 [IU] via SUBCUTANEOUS
  Administered 2023-12-28 – 2024-01-04 (×3): 2 [IU] via SUBCUTANEOUS
  Filled 2023-12-26: qty 0.05

## 2023-12-26 MED ORDER — RISPERIDONE 1 MG PO TABS: 1.0000 mg | ORAL_TABLET | Freq: Two times a day (BID) | ORAL | Status: DC

## 2023-12-26 MED ORDER — HALOPERIDOL LACTATE 5 MG/ML IJ SOLN
10.0000 mg | Freq: Three times a day (TID) | INTRAMUSCULAR | Status: DC | PRN
  Filled 2023-12-26: qty 2

## 2023-12-26 MED ORDER — INSULIN ASPART 100 UNIT/ML IJ SOLN
0.0000 [IU] | Freq: Three times a day (TID) | INTRAMUSCULAR | Status: DC
  Administered 2023-12-27: 15 [IU] via SUBCUTANEOUS
  Administered 2023-12-27: 3 [IU] via SUBCUTANEOUS
  Administered 2023-12-27: 15 [IU] via SUBCUTANEOUS
  Filled 2023-12-26: qty 0.15

## 2023-12-26 MED ORDER — RIVAROXABAN 10 MG PO TABS
10.0000 mg | ORAL_TABLET | Freq: Every day | ORAL | Status: DC
  Administered 2023-12-26: 10 mg via ORAL
  Filled 2023-12-26: qty 1

## 2023-12-26 MED ORDER — LORAZEPAM 2 MG/ML IJ SOLN
2.0000 mg | Freq: Three times a day (TID) | INTRAMUSCULAR | Status: DC | PRN
  Filled 2023-12-26 (×3): qty 1

## 2023-12-26 MED ORDER — LORAZEPAM 2 MG/ML IJ SOLN
2.0000 mg | Freq: Three times a day (TID) | INTRAMUSCULAR | Status: DC | PRN
  Administered 2023-12-27 – 2024-01-03 (×2): 2 mg via INTRAMUSCULAR

## 2023-12-26 MED ORDER — BENZTROPINE MESYLATE 1 MG PO TABS: 1.0000 mg | ORAL_TABLET | Freq: Every day | ORAL | Status: DC

## 2023-12-26 MED ORDER — INSULIN ASPART 100 UNIT/ML IJ SOLN
10.0000 [IU] | Freq: Three times a day (TID) | INTRAMUSCULAR | Status: DC
  Administered 2023-12-26: 10 [IU] via SUBCUTANEOUS

## 2023-12-26 MED ORDER — HALOPERIDOL 5 MG PO TABS: 5.0000 mg | ORAL_TABLET | Freq: Three times a day (TID) | ORAL | Status: DC | PRN

## 2023-12-26 MED ORDER — NICOTINE 14 MG/24HR TD PT24
14.0000 mg | MEDICATED_PATCH | Freq: Every day | TRANSDERMAL | Status: DC
  Filled 2023-12-26 (×13): qty 1

## 2023-12-26 NOTE — Consult Note (Signed)
 [facility] Psychiatric Consult Follow-up  Patient Name: .Jacob [name]  MRN: [id number]  DOB: November 19, 1990  Consult Order details:  Orders (From admission, onward)     Start     Ordered   12/24/23 0931  IP CONSULT TO PSYCHIATRY       Comments: 33 y.o. gentleman with history of T1DM, homelessness, documented bipolar disorder, piror suicide attempt. Multiple admission for DKA. Patient may be intentionally withholding insulin to induce DKA. In a addition endorses paranoid delusions. Denies active SI, HI. Would like to assess if he qualifies for inpatient treatment. Non-compliant outpatient.  Ordering Provider: Lovie Macadamia, MD  Provider:  (Not yet assigned)  Question Answer Comment  Location [facility]   Reason for Consult? Assessment for [facility] admission      12/24/23 0935             Mode of Visit: In person    Psychiatry Consult Evaluation  Service Date: December 26, 2023 LOS:  LOS: 3 days  Chief Complaint "Nothing is wrong with me"   Primary Psychiatric Diagnoses  Schizoaffective disorder, Bipolar type-History 2.  Cannabis use disorder    Assessment  Jacob [name] is a 33 y.o. male admitted: Medicallyfor 12/23/2023  1:00 PM for DKA. He carries the psychiatric diagnoses of Schizoaffective disorder, Bipolar type and Cannabis use disorder and has a past medical history of  T1DM and Multiple admission for DKA.    His current presentation of paranoid delusion and auditory hallucination is most consistent with his history of schizoaffective disorder. He meets criteria for inpatient admission due to ongoing psychotic symptoms and medication non compliance. He appears to be a danger to himself and others due to medication non complaince. Current outpatient psychotropic medications include Invega sustenna 156 mg q 4 weeks (last received on 02/16/2023), and Cogentin 1 mg daily for EPS. Historically patient has not been complaint with these medications. On initial  examination, patient appears disheveled, with poor hygiene. He is dismissive, uncooperative and evasive. His mood is dysphoric with constricted affect. He appears to be internally preoccupied and responding to internal stimuli. Please see plan below for detailed recommendations.    Diagnoses:  Active Hospital problems: Principal Problem:   DKA, type 1 (HCC) Active Problems:   Schizoaffective disorder, bipolar type (HCC)   Uncontrolled type 1 diabetes mellitus with hyperglycemia, with long-term current use of insulin (HCC)   Homeless    Plan   ## Psychiatric Medication Recommendations:  Continue Risperidone 1 mg twice daily for psychosis with plan to up titrate as needed.  Continue Cogentin 1 mg at bedtime for EPS Consider LAI invega sustenna once patient is able to tolerate Risperidone.   Voluntary psychiatric inpatient transfer once he is medically cleared Consider TOC/Social worker consult to facilitate transfer.   ## Medical Decision Making Capacity: Not specifically addressed in this encounter  ## Further Work-up:  -- most recent EKG on 12/23/23 had QtC of 426 -- Pertinent labwork reviewed earlier this admission includes: Utox-THC positive, BUN-24.    ## Disposition:-- We recommend inpatient psychiatric hospitalization when medically cleared. Patient is under voluntary admission status at this time; please IVC if attempts to leave hospital.  ## Behavioral / Environmental: - No specific recommendations at this time.     ## Safety and Observation Level:  - Based on my clinical evaluation, I estimate the patient to be at moderate risk of self harm in the current setting. - At this time, we recommend  routine. This decision is based on  my review of the chart including patient's history and current presentation, interview of the patient, mental status examination, and consideration of suicide risk including evaluating suicidal ideation, plan, intent, suicidal or self-harm behaviors,  risk factors, and protective factors. This judgment is based on our ability to directly address suicide risk, implement suicide prevention strategies, and develop a safety plan while the patient is in the clinical setting. Please contact our team if there is a concern that risk level has changed.  CSSR Risk Category:C-SSRS RISK CATEGORY: No Risk  Suicide Risk Assessment: Patient has following modifiable risk factors for suicide: untreated depression, social isolation, recklessness, and medication noncompliance, which we are addressing by administering medications. Patient has following non-modifiable or demographic risk factors for suicide: male gender and psychiatric hospitalization Patient has the following protective factors against suicide: Frustration tolerance  Thank you for this consult request. Recommendations have been communicated to the primary team.  We will continue to follow at this time.   Margaretmary Dys, MD       History of Present Illness  Relevant Aspects of [facility] Course:  Admitted on 12/23/2023 for management of DKA.   Patient Report: Patient seen at bedside this morning.  Patient seems improved relative to previous days reports of agitation, uncooperative behavior, however patient remains guarded with respect to admitting psychiatric symptoms. patient denies ongoing auditory hallucinations. He appears internally preoccupied and responding to internal stimuli by talking to self (he exhibited this when another staff member entered the room and patient began mumbling to himself). Patient appears to be minimizing symptoms, though patient admits to previous psychiatric hospitalizations and medication trials today.  Patient denies that any previous psychiatric medications have improved the voices (that he also denies but then occasionally seems to allude to ). patient admits to use of THC when confronted with positive urine drug screen denies any other  illicit substances.  Performed brief psychoeducation on how THC can increase risk of psychosis . he denies current suicidal or homicidal ideation, intent or plan.    Psych ROS:  Depression: appears depressed but refused to answer  Anxiety:  denies  Mania (lifetime and current): denies  Psychosis: (lifetime and current): Patient denies auditory hallucinations and paranoid delusion. He appears to be internally preoccupied.   Collateral information:  Patient refused to give consent for collateral .   Review of Systems  Constitutional:  Positive for weight loss. Negative for chills and fever.  Psychiatric/Behavioral:  Positive for depression and substance abuse. Negative for hallucinations and suicidal ideas. The patient is not nervous/anxious.      Psychiatric and Social History  Psychiatric History:  Information collected from the patient   Prev Dx/Sx: schizoaffective disorder, Bipolar type.  Current Psych Provider: none  Home Meds (current): none  Previous Med Trials: Abilify 15 mg daily, Abilify Maintenna 400 mg q 4 weeks, Invega sustenna 156 mg q 4 weeks, hydroxyzine, fluoxetine, olanzapine, risperidone, sertraline, temazepam, ziprasidone Therapy: denies   Prior Psych Hospitalization: Reports past hospitalizations at 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 Legal Hx:  refused to answer   Living Situation: Homeless Spiritual Hx:  refused to answer   Access to  weapons/lethal means: patient denies    Substance History Alcohol:  patient denies  Type of alcohol N/A Last Drink N/A Number of drinks per day N/A History of alcohol withdrawal seizures: denied history of DT's denied Tobacco: Reports occasional cigarette use Illicit drugs: THC refused to answer   Prescription drug abuse: History of Xanax use none current Rehab hx: Denied  Exam Findings  Physical Exam:  Vital Signs:  Temp:  [97.5 F (36.4 C)-98.6 F (37 C)] 97.8 F (36.6 C) (02/24 1119) Pulse Rate:  [66-83] 66 (02/24 1119) Resp:  [10-18] 18 (02/24 1119) BP: (102-115)/(57-83) 115/66 (02/24 1119) SpO2:  [94 %-99 %] 97 % (02/24 1119) Blood pressure 115/66, pulse 66, temperature 97.8 F (36.6 C), temperature source Oral, resp. rate 18, height 5\' 9"  (1.753 m), weight 50.2 kg, SpO2 97%. Body mass index is 16.34 kg/m.  Physical Exam Vitals and nursing note reviewed.  Constitutional:      Appearance: He is ill-appearing.  HENT:     Head: Normocephalic and atraumatic.  Pulmonary:     Effort: Pulmonary effort is normal.  Neurological:     Mental Status: He is alert and oriented to person, place, and time.  Psychiatric:        Attention and Perception: Attention normal.        Mood and Affect: Mood is depressed. Affect is inappropriate.        Speech: Speech normal.        Behavior: Behavior is cooperative.        Thought Content: Thought content is delusional. Thought content does not include homicidal or suicidal ideation.        Cognition and Memory: He exhibits impaired recent memory.        Judgment: Judgment is impulsive and inappropriate.     Comments: Seems to be responding to internal stimuli. Denies AVH. Denies paranoia currently, but reports that he suspects his mother of stealing his food stamps. Laughs about some inappropriate matters (eg his agitation at admission).     Mental Status Exam: General Appearance: Disheveled  Orientation:  Other:  UNKNOWN   Memory:   Immediate;   Poor  Concentration:  Concentration: Fair  Recall:  Fair  Attention  Fair  Eye Contact:  Poor  Speech:  clear, normal rate  Language:   good   Volume:  Decreased  Mood: "not great, but I have been going through a lot"  Affect:  Congruent and Inappropriate  Thought Process:  Coherent, Goal Directed, and Linear  Thought Content:  Delusions and Paranoid Ideation  Suicidal Thoughts:  No  Homicidal Thoughts:  No  Judgement:  Poor  Insight:  Lacking  Psychomotor Activity:  Decreased  Akathisia:  No  Fund of Knowledge:  Fair      Assets:  Communication Skills  Cognition:  WNL  ADL's:  Intact  AIMS (if indicated):        Other History   These have been pulled in through the EMR, reviewed, and updated if appropriate.  Family History:  The patient's family history includes Healthy in his father and mother.  Medical History: Past Medical History:  Diagnosis Date   Bipolar 1 disorder (HCC)    History of attempted suicide 2019   Unsuccessful suicide attempt  in 2019 with rat poison   Schizoaffective disorder (HCC)    Type 1 diabetes mellitus on insulin therapy (HCC) 2019    Surgical History: History reviewed. No pertinent surgical history.   Medications:   Current Facility-Administered Medications:    benztropine (COGENTIN) tablet 1 mg, 1 mg, Oral, QHS, Akintayo, Musa A, MD, 1 mg at 12/25/23 2044   dextrose 50 % solution 0-50 mL, 0-50 mL, Intravenous, PRN, Schuman, Dalaina Tates T, [name]   insulin aspart (novoLOG) injection 0-5 Units, 0-5 Units, Subcutaneous, QHS, Champ Mungo, DO   insulin aspart (novoLOG) injection 0-9 Units, 0-9 Units, Subcutaneous, TID WC, Champ Mungo, DO, 7 Units at 12/25/23 1737   insulin aspart (novoLOG) injection 10 Units, 10 Units, Subcutaneous, TID WC, Youssefzadeh, Keon, MD   insulin glargine (LANTUS) injection 24 Units, 24 Units, Subcutaneous, Daily, Champ Mungo, DO, 24 Units at 12/26/23 0915   risperiDONE (RISPERDAL) tablet 1 mg, 1 mg, Oral,  BID, Reymundo Poll, MD, 1 mg at 12/26/23 0915   rivaroxaban (XARELTO) tablet 10 mg, 10 mg, Oral, Daily, Lovie Macadamia, MD  Allergies: No Known Allergies  Margaretmary Dys, MD

## 2023-12-26 NOTE — TOC Transition Note (Signed)
 Transition of Care [facility]) - Discharge Note   Patient Details  Name: Jacob [name] MRN: [id number] Date of Birth: Apr 19, 1991  Transition of Care [facility]) CM/SW Contact:  Marliss Coots, LCSW Phone Number: 12/26/2023, 12:07 PM   Clinical Narrative:     Patient will DC to: [facility] Anticipated DC date: 12/26/2023 Family notified: Jacob [name]; Mother; [phone] Transport by: Safe Transport   Per MD patient ready for DC to [facility] RN to call report prior to discharge [phone]). RN, patient, patient's family, and facility notified of DC. Discharge Summary sent to facility. Safe transport requested for patient.   CSW will sign off for now as social work intervention is no longer needed. Please consult [place] again if new needs arise.   Final next level of care: Psychiatric Hospital Barriers to Discharge: Barriers Resolved   Patient Goals and CMS Choice Patient states their goals for this hospitalization and ongoing recovery are:: IP Psychiatry          Discharge Placement                Patient to be transferred to facility by: Safe Transport Name of family member notified: Jacob [name]; Mother; [phone] Patient and family notified of of transfer: 12/26/23  Discharge Plan and Services Additional resources added to the After Visit Summary for   In-house Referral: Clinical Social Work   Post Acute Care Choice:  (IP Psychiatry)                               Social Drivers of Health (SDOH) Interventions SDOH Screenings   Food Insecurity: Patient Unable To Answer (12/24/2023)  Housing: High Risk (12/24/2023)  Transportation Needs: Patient Unable To Answer (12/24/2023)  Utilities: Patient Unable To Answer (12/24/2023)  Alcohol Screen: Low Risk  (06/06/2020)  Depression (PHQ2-9): Low Risk  (08/22/2023)  Social Connections: Unknown (03/02/2022)   Received from [facility]  Tobacco Use: Medium Risk  (12/24/2023)     Readmission Risk Interventions     No data to display

## 2023-12-26 NOTE — TOC Initial Note (Addendum)
 Transition of Care [facility]) - Initial/Assessment Note    Patient Details  Name: Jacob [name] MRN: [id number] Date of Birth: [birth year]  [facility]) CM/SW Contact:    Marliss Coots, LCSW Phone Number: 12/26/2023, 10:15 AM  Clinical Narrative:                  10:15 AM Per psychiatry team, patient consented to IP Psychiatry transfer (expressed preference in [facility]) and is medically ready for transfer. CSW submitted referrals to IP Psychiatry units.  10:53 AM [facility] called CSW offering bed for tomorrow morning (Dr. Forrestine Him; [facility]; [phone]; [facility]). [facility] called CSW informed that they are at bed capacity. Per RN Danika; [facility] is able to offer bed for discharge today.   Expected Discharge Plan: Psychiatric Hospital Barriers to Discharge: Psych Bed not available   Patient Goals and CMS Choice Patient states their goals for this hospitalization and ongoing recovery are:: IP Psychiatry          Expected Discharge Plan and Services In-house Referral: Clinical Social Work   Post Acute Care Choice:  (IP Psychiatry) Living arrangements for the past 2 months: Homeless                                      Prior Living Arrangements/Services Living arrangements for the past 2 months: Homeless Lives with:: Self Patient language and need for interpreter reviewed:: Yes        Need for Family Participation in Patient Care: No (Comment) Care giver support system in place?: No (comment)   Criminal Activity/Legal Involvement Pertinent to Current Situation/Hospitalization: No - Comment as needed  Activities of Daily Living   ADL Screening (condition at time of admission) Independently performs ADLs?: No Does the patient have a NEW difficulty with bathing/dressing/toileting/self-feeding that is expected to last >3 days?: No Does the patient have a NEW difficulty with getting in/out of bed, walking, or climbing stairs that is expected  to last >3 days?: No Does the patient have a NEW difficulty with communication that is expected to last >3 days?: No Is the patient deaf or have difficulty hearing?: No Does the patient have difficulty seeing, even when wearing glasses/contacts?: No Does the patient have difficulty concentrating, remembering, or making decisions?: Yes  Permission Sought/Granted Permission sought to share information with : Family Supports, Oceanographer granted to share information with : No (Contact information on chart; not IVC)  Share Information with NAME: [name]  Permission granted to share info w AGENCY: IP Psychiatry  Permission granted to share info w Relationship: Mother  Permission granted to share info w Contact Information: [phone]  Emotional Assessment       Orientation: : Oriented to Self, Oriented to Place, Oriented to Situation, Oriented to  Time Alcohol / Substance Use: Not Applicable Psych Involvement: Yes (comment)  Admission diagnosis:  DKA, type 1 (HCC) [E10.10] Diabetic ketoacidosis without coma associated with type 1 diabetes mellitus (HCC) [E10.10] Patient Active Problem List   Diagnosis Date Noted   Homeless 12/17/2023   AKI (acute kidney injury) (HCC) 12/17/2023   Homelessness 12/17/2023   Influenza A 11/30/2023   DKA (diabetic ketoacidosis) (HCC) 11/30/2023   DKA, type 1 (HCC) 10/23/2023   Uncontrolled type 1 diabetes mellitus with hyperglycemia, with long-term current use of insulin (HCC) 10/10/2023   Blister (nonthermal), right foot, initial encounter 08/01/2023  Generalized anxiety disorder 08/31/2022   Tobacco dependence 08/31/2022   Schizoaffective disorder, bipolar type (HCC) 12/03/2021   Uncontrolled type 1 diabetes mellitus with hypoglycemia without coma (HCC) 06/06/2020   Polycythemia 03/04/2020   MDD (major depressive disorder), recurrent, severe, with psychosis (HCC)    PCP:  [name], MD Pharmacy:    [facility] [address], [place] [facility] [address] [place] [address] Phone: [phone] Fax: [phone]  [facility] [address] [place] [address] Phone: [phone] Fax: [phone]  [facility] [address] [place] [address] Phone: [phone] Fax: [phone]     Social Drivers of Health (SDOH) Social History: SDOH Screenings   Food Insecurity: Patient Unable To Answer (12/24/2023)  Housing: High Risk (12/24/2023)  Transportation Needs: Patient Unable To Answer (12/24/2023)  Utilities: Patient Unable To Answer (12/24/2023)  Alcohol Screen: Low Risk  (06/06/2020)  Depression (PHQ2-9): Low Risk  (08/22/2023)  Social Connections: Unknown (03/02/2022)   [facility] from [name]  Tobacco Use: Medium Risk (12/24/2023)   SDOH Interventions:     Readmission Risk Interventions     No data to display

## 2023-12-26 NOTE — Plan of Care (Signed)

## 2023-12-26 NOTE — Inpatient Diabetes Management (Signed)
 Inpatient Diabetes Program Recommendations  AACE/ADA: New Consensus Statement on Inpatient Glycemic Control  Target Ranges:  Prepandial:   less than 140 mg/dL      Peak postprandial:   less than 180 mg/dL (1-2 hours)      Critically ill patients:  140 - 180 mg/dL    Latest Reference Range & Units 12/24/23 07:55 12/24/23 09:24 12/24/23 11:28 12/24/23 12:44 12/24/23 16:09 12/24/23 21:01 12/25/23 06:19 12/25/23 11:50 12/25/23 16:46 12/25/23 20:40  Glucose-Capillary 70 - 99 mg/dL 409 (H) 811 (H) 914 (H) 340 (H) 303 (H) 354 (H) 252 (H) 514 (HH) 311 (H) 82   Review of Glycemic Control  Diabetes history: DM1 Outpatient Diabetes medications: Lantus 24 units at bedtime, Humalog 7 units TID with meals; Dexcom G7 CGM Current orders for Inpatient glycemic control: Lantus 24 units daily, Novolog 0-9 units TID with meals, Novolog 0-5 units at bedtime, Novolog 7 units TID with meals  Inpatient Diabetes Program Recommendations:    Insulin: Please consider increasing meal coverage to Novolog 9 units TID with meals.  Thanks, Orlando Penner, RN, MSN, CDCES Diabetes Coordinator Inpatient Diabetes Program [phone] (Team Pager from 8am to 5pm)

## 2023-12-26 NOTE — [facility] Group Notes (Signed)
 [facility] Group Notes:  (Nursing/MHT/Case Management/Adjunct)  Date:  12/26/2023  Time:  10:54 PM  Type of Therapy:  Psychoeducational Skills  Participation Level:  Did Not Attend  Participation Quality:  Resistant  Affect:  Resistant  Cognitive:  Lacking  Insight:  None  Engagement in Group:  None  Modes of Intervention:  Education  Summary of Progress/Problems:The patient did not attend group this evening.   Jacob [name] 12/26/2023, 10:54 PM

## 2023-12-26 NOTE — Plan of Care (Signed)
 Problem: Education: Goal: Ability to describe self-care measures that may prevent or decrease complications (Diabetes Survival Skills Education) will improve Outcome: Adequate for Discharge Goal: Individualized Educational Video(s) Outcome: Adequate for Discharge   Problem: Coping: Goal: Ability to adjust to condition or change in health will improve 12/26/2023 1122 by Jacob Bigness, RN Outcome: Adequate for Discharge 12/26/2023 5366 by Jacob Bigness, RN Outcome: Progressing   Problem: Fluid Volume: Goal: Ability to maintain a balanced intake and output will improve 12/26/2023 1122 by Jacob Bigness, RN Outcome: Adequate for Discharge 12/26/2023 4403 by Jacob Bigness, RN Outcome: Progressing   Problem: Health Behavior/Discharge Planning: Goal: Ability to identify and utilize available resources and services will improve Outcome: Adequate for Discharge Goal: Ability to manage health-related needs will improve Outcome: Adequate for Discharge   Problem: Metabolic: Goal: Ability to maintain appropriate glucose levels will improve 12/26/2023 1122 by Jacob Bigness, RN Outcome: Adequate for Discharge 12/26/2023 4742 by Jacob Bigness, RN Outcome: Progressing   Problem: Nutritional: Goal: Maintenance of adequate nutrition will improve Outcome: Adequate for Discharge Goal: Progress toward achieving an optimal weight will improve Outcome: Adequate for Discharge   Problem: Skin Integrity: Goal: Risk for impaired skin integrity will decrease 12/26/2023 1122 by Jacob Bigness, RN Outcome: Adequate for Discharge 12/26/2023 0811 by Jacob Bigness, RN Outcome: Progressing   Problem: Tissue Perfusion: Goal: Adequacy of tissue perfusion will improve Outcome: Adequate for Discharge   Problem: Education: Goal: Ability to describe self-care measures that may prevent or decrease complications (Diabetes Survival Skills Education) will improve Outcome: Adequate for  Discharge Goal: Individualized Educational Video(s) Outcome: Adequate for Discharge   Problem: Cardiac: Goal: Ability to maintain an adequate cardiac output will improve Outcome: Adequate for Discharge   Problem: Health Behavior/Discharge Planning: Goal: Ability to identify and utilize available resources and services will improve Outcome: Adequate for Discharge Goal: Ability to manage health-related needs will improve Outcome: Adequate for Discharge   Problem: Fluid Volume: Goal: Ability to achieve a balanced intake and output will improve Outcome: Adequate for Discharge   Problem: Metabolic: Goal: Ability to maintain appropriate glucose levels will improve 12/26/2023 1122 by Jacob Bigness, RN Outcome: Adequate for Discharge 12/26/2023 5956 by Jacob Bigness, RN Outcome: Progressing   Problem: Nutritional: Goal: Maintenance of adequate nutrition will improve 12/26/2023 1122 by Jacob Bigness, RN Outcome: Adequate for Discharge 12/26/2023 3875 by Jacob Bigness, RN Outcome: Progressing Goal: Maintenance of adequate weight for body size and type will improve 12/26/2023 1122 by Jacob Bigness, RN Outcome: Adequate for Discharge 12/26/2023 6433 by Jacob Bigness, RN Outcome: Progressing   Problem: Respiratory: Goal: Will regain and/or maintain adequate ventilation 12/26/2023 1122 by Jacob Bigness, RN Outcome: Adequate for Discharge 12/26/2023 2951 by Jacob Bigness, RN Outcome: Progressing   Problem: Urinary Elimination: Goal: Ability to achieve and maintain adequate renal perfusion and functioning will improve 12/26/2023 1122 by Jacob Bigness, RN Outcome: Adequate for Discharge 12/26/2023 8841 by Jacob Bigness, RN Outcome: Progressing   Problem: Education: Goal: Knowledge of General Education information will improve Description: Including pain rating scale, medication(s)/side effects and non-pharmacologic comfort measures Outcome: Adequate  for Discharge   Problem: Health Behavior/Discharge Planning: Goal: Ability to manage health-related needs will improve Outcome: Adequate for Discharge   Problem: Clinical Measurements: Goal: Ability to maintain clinical measurements within normal limits will improve Outcome: Adequate for Discharge Goal: Will remain free from infection Outcome: Adequate for Discharge Goal: Diagnostic test results  will improve Outcome: Adequate for Discharge Goal: Respiratory complications will improve Outcome: Adequate for Discharge Goal: Cardiovascular complication will be avoided Outcome: Adequate for Discharge   Problem: Activity: Goal: Risk for activity intolerance will decrease 12/26/2023 1122 by Jacob Bigness, RN Outcome: Adequate for Discharge 12/26/2023 7846 by Jacob Bigness, RN Outcome: Progressing   Problem: Nutrition: Goal: Adequate nutrition will be maintained 12/26/2023 1122 by Jacob Bigness, RN Outcome: Adequate for Discharge 12/26/2023 9629 by Jacob Bigness, RN Outcome: Progressing   Problem: Coping: Goal: Level of anxiety will decrease 12/26/2023 1122 by Jacob Bigness, RN Outcome: Adequate for Discharge 12/26/2023 5284 by Jacob Bigness, RN Outcome: Progressing   Problem: Elimination: Goal: Will not experience complications related to bowel motility 12/26/2023 1122 by Jacob Bigness, RN Outcome: Adequate for Discharge 12/26/2023 1324 by Jacob Bigness, RN Outcome: Progressing Goal: Will not experience complications related to urinary retention 12/26/2023 1122 by Jacob Bigness, RN Outcome: Adequate for Discharge 12/26/2023 4010 by Jacob Bigness, RN Outcome: Progressing   Problem: Pain Managment: Goal: General experience of comfort will improve and/or be controlled 12/26/2023 1122 by Jacob Bigness, RN Outcome: Adequate for Discharge 12/26/2023 2725 by Jacob Bigness, RN Outcome: Progressing   Problem: Safety: Goal: Ability to  remain free from injury will improve 12/26/2023 1122 by Jacob Bigness, RN Outcome: Adequate for Discharge 12/26/2023 3664 by Jacob Bigness, RN Outcome: Progressing   Problem: Skin Integrity: Goal: Risk for impaired skin integrity will decrease 12/26/2023 1122 by Jacob Bigness, RN Outcome: Adequate for Discharge 12/26/2023 4034 by Jacob Bigness, RN Outcome: Progressing

## 2023-12-26 NOTE — BHH Group Notes (Signed)
Patient attended the AA group. 

## 2023-12-26 NOTE — Progress Notes (Signed)
 Patient ID: Jacob [name], male   DOB: 05-31-1991, 33 y.o.   MRN: [id number] Patient is an [place] American male who was admitted to the unit due to medication non compliance and using chronic illness symptoms to ensure admission into medical hospitals. Per report, patient refuses to take insulin which leads to DKA and inpatient hospital admissions.  Safety search complete patient found to be free of all contraband and injury.   Patient admitted to the unit on general observation without incident.
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
## 2023-12-26 NOTE — Plan of Care (Signed)
  Problem: Coping: Goal: Ability to adjust to condition or change in health will improve Outcome: Progressing   Problem: Fluid Volume: Goal: Ability to maintain a balanced intake and output will improve Outcome: Progressing   Problem: Metabolic: Goal: Ability to maintain appropriate glucose levels will improve Outcome: Progressing   Problem: Skin Integrity: Goal: Risk for impaired skin integrity will decrease Outcome: Progressing   Problem: Metabolic: Goal: Ability to maintain appropriate glucose levels will improve Outcome: Progressing   Problem: Nutritional: Goal: Maintenance of adequate nutrition will improve Outcome: Progressing Goal: Maintenance of adequate weight for body size and type will improve Outcome: Progressing   Problem: Respiratory: Goal: Will regain and/or maintain adequate ventilation Outcome: Progressing   Problem: Urinary Elimination: Goal: Ability to achieve and maintain adequate renal perfusion and functioning will improve Outcome: Progressing   Problem: Activity: Goal: Risk for activity intolerance will decrease Outcome: Progressing   Problem: Nutrition: Goal: Adequate nutrition will be maintained Outcome: Progressing   Problem: Coping: Goal: Level of anxiety will decrease Outcome: Progressing   Problem: Elimination: Goal: Will not experience complications related to bowel motility Outcome: Progressing Goal: Will not experience complications related to urinary retention Outcome: Progressing   Problem: Pain Managment: Goal: General experience of comfort will improve and/or be controlled Outcome: Progressing   Problem: Safety: Goal: Ability to remain free from injury will improve Outcome: Progressing   Problem: Skin Integrity: Goal: Risk for impaired skin integrity will decrease Outcome: Progressing

## 2023-12-26 NOTE — Progress Notes (Signed)
 HD#3 Subjective:   Summary: Jacob [name] is a 33 y.o. male with past medical history of schizoaffective disorder, T1DM, who presents with emesis and hyperglycemia and was admitted for DKA. During this admission, he has been found to be responding to internal stimuli and will discharge to [facility].   Overnight Events: None  Affect again pleasant today, improved. No new symptoms, abdominal pain, NV, fevers, chills. Tolerating PO well. Sugars a bit high, likely in part due to outside foods .  Objective:  Vital signs in last 24 hours: Vitals:   12/25/23 1948 12/25/23 2300 12/26/23 0500 12/26/23 0755  BP: 111/83 102/67 115/73 (!) 113/59  Pulse: 73 71 66   Resp: 11 10 16 18   Temp: 98.1 F (36.7 C) 98.4 F (36.9 C) 98.6 F (37 C) (!) 97.5 F (36.4 C)  TempSrc: Oral Oral Oral Oral  SpO2: 99% 94% 95% 96%  Weight:      Height:       Supplemental O2: Room Air SpO2: 96 %   Physical Exam:  Cardiovascular: regular rate and rhythm, no m/r/g Pulmonary/Chest: normal work of breathing on room air, lungs clear to auscultation bilaterally Abdominal: soft, non-tender, non-distended, positive bowel sounds Skin: warm and dry Psych: appropriate   Filed Weights   12/23/23 1848  Weight: 50.2 kg      Intake/Output Summary (Last 24 hours) at 12/26/2023 1020 Last data filed at 12/26/2023 0757 Gross per 24 hour  Intake 480 ml  Output 1900 ml  Net -1420 ml   Net IO Since Admission: -229.87 mL [12/26/23 1020]  Pertinent Labs:    Latest Ref Rng & Units 12/26/2023    9:13 AM 12/23/2023    2:08 PM 12/23/2023    1:54 PM  CBC  WBC 4.0 - 10.5 K/uL 3.6     Hemoglobin 13.0 - 17.0 g/dL 16.1  09.6  04.5   Hematocrit 39.0 - 52.0 % 38.4  53.0  55.0   Platelets 150 - 400 K/uL 240          Latest Ref Rng & Units 12/26/2023    9:13 AM 12/25/2023    2:12 AM 12/24/2023    6:53 AM  CMP  Glucose 70 - 99 mg/dL 409  811  914   BUN 6 - 20 mg/dL 16  15  24    Creatinine 0.61 - 1.24 mg/dL 7.82  9.56  2.13    Sodium 135 - 145 mmol/L 132  132  143   Potassium 3.5 - 5.1 mmol/L 4.0  3.7  4.0   Chloride 98 - 111 mmol/L 95  97  106   CO2 22 - 32 mmol/L 30  28  28    Calcium 8.9 - 10.3 mg/dL 8.8  8.7  9.4     Assessment/Plan:   Principal Problem:   DKA, type 1 (HCC) Active Problems:   Schizoaffective disorder, bipolar type (HCC)   Uncontrolled type 1 diabetes mellitus with hyperglycemia, with long-term current use of insulin (HCC)   Homeless   DKA Type 1 diabetes Hyperkalemia - Resolved AMS Gap is persistently closed. Blood sugars a bit elevated today. On Lantus 24 units daily, increased to 10 units with meals plus sliding scale correction.    AKI - Resolved Baseline creatinine 0.8-1. Elevated to 2.21 on admission, and now returned to baseline  History of suicide attempt History of Schizophrenia  Evaluated by psychiatry team - appreciate their input. Will begin risperidone 1mg  BID and monitor for response. Inpatient behavioral health recommended  as patient is a responding to internal stimuli and threat to self due to repeat episodes of medication non-compliance and hospitalization which are felt to be in large part due to his psychiatric co-morbidities. He is more cooperative, affect less constricted. Discussion with psych team today - appreciate their help. He is willing to go to [facility] voluntarily and will seek treatment to help break the cycle he is stuck in.   Diet: Carb-Modified IVF: None VTE: DOAC Code: Full  Dispo: Anticipated discharge to Inpatient psych pending bed availability and continued resolution of metabolic abnormalities.   Lovie Macadamia MD Internal Medicine Resident PGY-1 Pager: [phone] Please contact the on call pager after 5 pm and on weekends at [phone].

## 2023-12-26 NOTE — Discharge Summary (Signed)
 Name: Jacob [name] MRN: [id number] DOB: 1991/03/29 33 y.o. PCP: [name], MD  Date of Admission: 12/23/2023  1:00 PM Date of Discharge:  12/26/2023 Attending Physician: Dr. Antony Contras  DISCHARGE DIAGNOSIS:  Primary Problem: DKA, type 1 [facility])   Hospital Problems: Principal Problem:   DKA, type 1 (HCC) Active Problems:   Schizoaffective disorder, bipolar type (HCC)   Uncontrolled type 1 diabetes mellitus with hyperglycemia, with long-term current use of insulin (HCC)   Homeless    DISCHARGE MEDICATIONS:   Allergies as of 12/26/2023   No Known Allergies      Medication List     STOP taking these medications    Lantus SoloStar 100 UNIT/ML Solostar Pen Generic drug: insulin glargine Replaced by: insulin glargine 100 UNIT/ML injection       TAKE these medications    Accu-Chek Guide Test test strip Generic drug: glucose blood Use up to 3 strips a day when not wearing Continuous glucose monitor   benztropine 1 MG tablet Commonly known as: COGENTIN Take 1 tablet (1 mg total) by mouth at bedtime.   Dexcom G7 Sensor Misc Place new sensor every 10 days. Use to continuously monitor blood sugar.   insulin glargine 100 UNIT/ML injection Commonly known as: LANTUS Inject 0.24 mLs (24 Units total) into the skin daily. Replaces: Lantus SoloStar 100 UNIT/ML Solostar Pen   insulin lispro 100 UNIT/ML KwikPen Commonly known as: HUMALOG Inject 10 Units into the skin 3 (three) times daily before meals. What changed: how much to take   Insupen Pen Needles 32G X 4 MM Misc Generic drug: Insulin Pen Needle Use  3  times daily with insulin.   Insupen Pen Needles 32G X 4 MM Misc Generic drug: Insulin Pen Needle Use to inject insulin up to 4 times a day   Lancet Device Misc 1 each by Does not apply route 3 (three) times daily. May dispense any manufacturer covered by patient's insurance.   Omnipod 5 DexG7G6 Pods Gen 5 Misc Use to administer up to 50 units, change pods  every 2 days   risperiDONE 1 MG tablet Commonly known as: RISPERDAL Take 1 tablet (1 mg total) by mouth 2 (two) times daily.        DISPOSITION AND FOLLOW-UP:  [name] was discharged from [facility] in Stable condition. At the hospital follow up visit please address:  Follow-up Recommendations: Consults: Psychiatry Labs: None Studies: None Medications: Follow up insulin needs and psychiatric medications changes.  Follow-up Appointments: March 10th, [facility] W/ Dr. Terrill Mohr COURSE:  Patient Summary: Jacob [name] is a 33 y.o. male with past medical history of schizoaffective disorder, T1DM, who presents with emesis and hyperglycemia and was admitted for DKA. During this admission, he has been found to be responding to internal stimuli and will discharge to [facility]. His DKA quickly resolved with IV fluids and insulin. He was successfully transitioned to subcutaneous insulin. He will obtain further treatment at [facility] after this.   DKA Type 1 diabetes Hyperkalemia - Resolved AMS Gap is persistently closed. Blood sugars a bit elevated today. On Lantus 24 units daily, increased to 10 units short acting with meals.   AKI - Resolved Resolved with fluids   History of suicide attempt History of Schizophrenia  Evaluated by psychiatry team - appreciate their input. Will begin risperidone 1mg  BID and monitor for response. Inpatient behavioral health recommended as patient is a responding to internal stimuli and threat to self due to repeat episodes of medication  non-compliance and hospitalization which are felt to be in large part due to his psychiatric co-morbidities. He is more cooperative, affect less constricted. Discussion with psych team today - appreciate their help. He is willing to go to [facility] voluntarily and will seek treatment to help break the cycle he is stuck in.     DISCHARGE INSTRUCTIONS:   Discharge Instructions     Call MD for:  difficulty  breathing, headache or visual disturbances   Complete by: As directed    Call MD for:  extreme fatigue   Complete by: As directed    Call MD for:  persistant dizziness or light-headedness   Complete by: As directed    Call MD for:  persistant nausea and vomiting   Complete by: As directed    Call MD for:  temperature >100.4   Complete by: As directed    Diet Carb Modified   Complete by: As directed        Affect again pleasant today, improved. No new symptoms, abdominal pain, NV, fevers, chills. Tolerating PO well. Sugars a bit high, likely in part due to outside foods .   Discharge Vitals:   BP 115/66 (BP Location: Left Arm)   Pulse 66   Temp 97.8 F (36.6 C) (Oral)   Resp 18   Ht 5\' 9"  (1.753 m)   Wt 50.2 kg   SpO2 97%   BMI 16.34 kg/m   OBJECTIVE:  Cardiovascular: regular rate and rhythm, no m/r/g Pulmonary/Chest: normal work of breathing on room air, lungs clear to auscultation bilaterally Abdominal: soft, non-tender, non-distended, positive bowel sounds Skin: warm and dry Psych: appropriate    Pertinent Labs, Studies, and Procedures:     Latest Ref Rng & Units 12/26/2023    9:13 AM 12/23/2023    2:08 PM 12/23/2023    1:54 PM  CBC  WBC 4.0 - 10.5 K/uL 3.6     Hemoglobin 13.0 - 17.0 g/dL 08.6  57.8  46.9   Hematocrit 39.0 - 52.0 % 38.4  53.0  55.0   Platelets 150 - 400 K/uL 240          Latest Ref Rng & Units 12/26/2023    9:13 AM 12/25/2023    2:12 AM 12/24/2023    6:53 AM  CMP  Glucose 70 - 99 mg/dL 629  528  413   BUN 6 - 20 mg/dL 16  15  24    Creatinine 0.61 - 1.24 mg/dL 2.44  0.10  2.72   Sodium 135 - 145 mmol/L 132  132  143   Potassium 3.5 - 5.1 mmol/L 4.0  3.7  4.0   Chloride 98 - 111 mmol/L 95  97  106   CO2 22 - 32 mmol/L 30  28  28    Calcium 8.9 - 10.3 mg/dL 8.8  8.7  9.4     No results found.   Signed: Lovie Macadamia MD Internal Medicine Resident, PGY-1 [facility]  Pager: [phone] 12:23 PM, 12/26/2023

## 2023-12-26 NOTE — Discharge Instructions (Addendum)
 You were hospitalized for Diabetic Ketoacidosis. Thank you for allowing [place] to be part of your care.   We arranged for you to follow up at:  The internal medicine clinic on March 10th @ 3:15pm with Dr. Rosaura Carpenter  Please START/Continue taking:  Insulin Glargine 24 units once daily Insulin Lispro 10 unit with meals  Cogentin 1mg  at bedtime Risperidone 1mg  BID   You will be voluntarily treated at the behavioral health hospital. Please make sure you follow up with [place] after your are discharged.   Please call our clinic if you have any questions or concerns, we may be able to help and keep you from a long and expensive emergency room wait. Our clinic and after hours phone number is [phone], the best time to call is Monday through Friday 9 am to 4 pm but there is always someone available 24/7 if you have an emergency. If you need medication refills please notify your pharmacy one week in advance and they will send [place] a request.

## 2023-12-27 ENCOUNTER — Encounter (HOSPITAL_COMMUNITY): Payer: Self-pay

## 2023-12-27 DIAGNOSIS — F259 Schizoaffective disorder, unspecified: Secondary | ICD-10-CM | POA: Diagnosis not present

## 2023-12-27 DIAGNOSIS — Z765 Malingerer [conscious simulation]: Secondary | ICD-10-CM | POA: Insufficient documentation

## 2023-12-27 LAB — URINALYSIS, ROUTINE W REFLEX MICROSCOPIC
Bacteria, UA: NONE SEEN
Bilirubin Urine: NEGATIVE
Glucose, UA: >=500 mg/dL — AB
Hgb urine dipstick: NEGATIVE
Ketones, ur: NEGATIVE mg/dL
Leukocytes,Ua: NEGATIVE
Nitrite: NEGATIVE
Protein, ur: NEGATIVE mg/dL
Specific Gravity, Urine: 1.026 (ref 1.005–1.030)
pH: 7 (ref 5.0–8.0)

## 2023-12-27 LAB — GLUCOSE, CAPILLARY
Glucose-Capillary: 164 mg/dL — ABNORMAL HIGH (ref 70–99)
Glucose-Capillary: >600 mg/dL (ref 70–99)
Glucose-Capillary: >600 mg/dL (ref 70–99)
Glucose-Capillary: 333 mg/dL — ABNORMAL HIGH (ref 70–99)
Glucose-Capillary: 367 mg/dL — ABNORMAL HIGH (ref 70–99)

## 2023-12-27 LAB — BLOOD GAS, VENOUS
Acid-Base Excess: 6.1 mmol/L — ABNORMAL HIGH (ref 0.0–2.0)
Bicarbonate: 31.2 mmol/L — ABNORMAL HIGH (ref 20.0–28.0)
O2 Saturation: 99.2 %
Patient temperature: 37
pCO2, Ven: 46 mmHg (ref 44–60)
pH, Ven: 7.44 — ABNORMAL HIGH (ref 7.25–7.43)
pO2, Ven: 122 mmHg — ABNORMAL HIGH (ref 32–45)

## 2023-12-27 LAB — LIPID PANEL
Cholesterol: 200 mg/dL (ref 0–200)
HDL: 84 mg/dL (ref >40)
LDL Cholesterol: 77 mg/dL (ref 0–99)
Total CHOL/HDL Ratio: 2.4 ratio
Triglycerides: 194 mg/dL — ABNORMAL HIGH (ref <150)
VLDL: 39 mg/dL (ref 0–40)

## 2023-12-27 LAB — HEPATIC FUNCTION PANEL
ALT: 52 U/L — ABNORMAL HIGH (ref 0–44)
AST: 67 U/L — ABNORMAL HIGH (ref 15–41)
Albumin: 3.5 g/dL (ref 3.5–5.0)
Alkaline Phosphatase: 84 U/L (ref 38–126)
Bilirubin, Direct: <0.1 mg/dL (ref 0.0–0.2)
Total Bilirubin: 0.5 mg/dL (ref 0.0–1.2)
Total Protein: 6.3 g/dL — ABNORMAL LOW (ref 6.5–8.1)

## 2023-12-27 LAB — TSH: TSH: 3.661 u[IU]/mL (ref 0.350–4.500)

## 2023-12-27 LAB — BETA-HYDROXYBUTYRIC ACID: Beta-Hydroxybutyric Acid: 0.17 mmol/L (ref 0.05–0.27)

## 2023-12-27 LAB — CBG MONITORING, ED
Glucose-Capillary: >600 mg/dL (ref 70–99)
Glucose-Capillary: 310 mg/dL — ABNORMAL HIGH (ref 70–99)

## 2023-12-27 MED ORDER — INSULIN ASPART 100 UNIT/ML IJ SOLN
0.0000 [IU] | Freq: Three times a day (TID) | INTRAMUSCULAR | Status: DC
  Administered 2023-12-28: 2 [IU] via SUBCUTANEOUS
  Administered 2023-12-28 (×2): 9 [IU] via SUBCUTANEOUS
  Administered 2023-12-29 – 2023-12-30 (×2): 7 [IU] via SUBCUTANEOUS
  Administered 2023-12-30 (×2): 9 [IU] via SUBCUTANEOUS
  Administered 2023-12-31: 2 [IU] via SUBCUTANEOUS
  Administered 2023-12-31 – 2024-01-01 (×2): 7 [IU] via SUBCUTANEOUS
  Administered 2024-01-01: 3 [IU] via SUBCUTANEOUS
  Administered 2024-01-01: 1 [IU] via SUBCUTANEOUS
  Administered 2024-01-02: 3 [IU] via SUBCUTANEOUS
  Administered 2024-01-02: 5 [IU] via SUBCUTANEOUS
  Administered 2024-01-03: 2 [IU] via SUBCUTANEOUS
  Administered 2024-01-03: 9 [IU] via SUBCUTANEOUS
  Administered 2024-01-03: 3 [IU] via SUBCUTANEOUS
  Administered 2024-01-04: 2 [IU] via SUBCUTANEOUS
  Administered 2024-01-04: 5 [IU] via SUBCUTANEOUS
  Administered 2024-01-04: 7 [IU] via SUBCUTANEOUS

## 2023-12-27 MED ORDER — INSULIN ASPART 100 UNIT/ML IJ SOLN
4.0000 [IU] | Freq: Once | INTRAMUSCULAR | Status: AC
  Administered 2023-12-27: 4 [IU] via SUBCUTANEOUS

## 2023-12-27 MED ORDER — LACTATED RINGERS IV BOLUS
2000.0000 mL | Freq: Once | INTRAVENOUS | Status: AC
  Administered 2023-12-27: 2000 mL via INTRAVENOUS

## 2023-12-27 MED ORDER — INSULIN ASPART 100 UNIT/ML IJ SOLN
3.0000 [IU] | Freq: Once | INTRAMUSCULAR | Status: AC
  Administered 2023-12-27: 3 [IU] via SUBCUTANEOUS
  Filled 2023-12-27: qty 0.03

## 2023-12-27 MED ORDER — INSULIN GLARGINE-YFGN 100 UNIT/ML ~~LOC~~ SOLN
24.0000 [IU] | Freq: Every day | SUBCUTANEOUS | Status: DC
Start: 2023-12-27 — End: 2023-12-30
  Administered 2023-12-27 – 2023-12-28 (×2): 24 [IU] via SUBCUTANEOUS
  Filled 2023-12-27 (×3): qty 0.24

## 2023-12-27 NOTE — Progress Notes (Incomplete)
 33 year old African-American male, single, unemployed, applied for SSI disability, homeless.  History of insulin-dependent diabetes and historical diagnosis of schizoaffective disorder.  He has had repeated medical admission on account of DKA.  Referral to psychiatric unit based on suspicion that this is a deliberate attempt to end his life.  He has not been fully adherent with regimen for insulin.  He reports losing his insulin in the community.  Patient has not been adherent with psychiatric follow-up over the years.  He has not been taking any psychotropic medication for months.  He is not endorsing any psychotic symptoms.  He is not endorsing any manic symptoms.  He is not pervasively depressed.  No overwhelming anxiety.  He denies any suicidal thoughts.  States that his goal is to stay here until his insurance approve his insulin in the community again.  He hopes to get approved for Social Security so that he can get a place of his own.  He is not keen on any psychotropic medication.  We will maintain his medical medicines and evaluate him for any underlying psychopathology.  We will use as needed medications in the meantime as there is no indication to treat any psychiatric illness at this point in time.

## 2023-12-27 NOTE — Progress Notes (Signed)
 Pt is cursing, waving arms, talking to himself, argumentative with staff and defiant. Pt is Type 1 diabetic and staff was told no additional snacks d/t the fact that pt blood sugar is in 300s and pt just returned from [facility] for blood sugar in excess of 600 today. Pt was told that he could not have any ice cream. Pt pushed into the galley on [address] and got ice cream. Pt refused to give it to staff. Pt began walking around licking the ice cream out of the cup. Pt confrontational in the day room. Called for show of support for administration of agitation protocol. Pt given agitation protocol without incident. Also administered insulin for CBG of 367. Will continue to monitor.

## 2023-12-27 NOTE — ED Triage Notes (Signed)
 Pt arrives via [facility] from [facility] after he had high CBG readings. Pt reports that he was there d/t homelessness, denies any SI/HI but making rambling thoughts in triage. Denies taking medications other than his insulin. NAD noted during triage.
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
## 2023-12-27 NOTE — Progress Notes (Signed)
   12/27/23 0600  15 Minute Checks  Location Hallway  Visual Appearance Calm  Behavior Composed  Sleep (Behavioral Health Patients Only)  Calculate sleep? (Click Yes once per 24 hr at 0600 safety check) Yes  Documented sleep last 24 hours 6

## 2023-12-27 NOTE — Group Note (Signed)
 LCSW Group Therapy Note   Group Date: 12/27/2023 Start Time: 1100 End Time: 1200  Participation:  did not attend  Type of Therapy:  Group Therapy  Topic:  Healing From Within: Understanding Our Past, Building Our Future   Objective:  To help participants understand the impact of early experiences on mental and physical health, with a focus on Adverse Childhood Experiences (ACEs), and to explore ways to build resilience and healing.  Goals: Understand ACEs and Their Impact: Learn how childhood experiences shape mental and physical health. Build Resilience: Develop strategies for overcoming challenges and creating positive change. Promote Healing: Recognize the value of support and the possibility of healing through therapy and self-care.  Therapeutic Modalities Used: Psychoeducation: Sharing information about ACEs and their effects. Cognitive Behavioral Therapy (CBT): Helping reframe negative thought patterns. Trauma-Informed Therapy: Creating a safe, supportive space for healing.   Jacob [name] Jacob [name], LCSWA 12/27/2023  1:30 PM

## 2023-12-27 NOTE — ED Provider Triage Note (Signed)
 Emergency Medicine Provider Triage Evaluation Note  Jacob [name] , a 33 y.o. male  was evaluated in triage.  Pt complains of hyperglycemia.  Patient is a type I diabetic on insulin.  Was at behavioral health 1 he was sent over here out of concerns for persistent hypoglycemia.  Patient states that his sugar levels have been elevated and he has not been taking his insulin.  Does report that he had multiple cups of full sugar Coke around lunchtime today.  Currently having some mild nausea with some abdominal pain but no active vomiting or diarrhea.  No polyuria, polydipsia, polyphagia.  Review of Systems  Positive: As above Negative: As above  Physical Exam  BP 135/81 (BP Location: Right Arm)   Pulse 83   Temp 98.7 F (37.1 C) (Oral)   Resp 18   Ht 5\' 9"  (1.753 m)   Wt 58.5 kg   SpO2 100%   BMI 19.05 kg/m  Gen:   Awake, no distress   Resp:  Normal effort  MSK:   Moves extremities without difficulty  Other:   Medical Decision Making  Medically screening exam initiated at 3:17 PM.  Appropriate orders placed.  Jacob [name] was informed that the remainder of the evaluation will be completed by another provider, this initial triage assessment does not replace that evaluation, and the importance of remaining in the ED until their evaluation is complete.     Smitty Knudsen, [name] 12/27/23 1517

## 2023-12-27 NOTE — Progress Notes (Signed)
   12/27/23 2030  Psych Admission Type (Psych Patients Only)  Admission Status Voluntary  Psychosocial Assessment  Patient Complaints None  Eye Contact Fair  Facial Expression Animated  Affect Labile  Speech Logical/coherent  Interaction Assertive  Motor Activity Other (Comment) (wnl)  Appearance/Hygiene Unremarkable  Behavior Characteristics Fidgety  Mood Silly  Thought Process  Coherency Circumstantial  Content Delusions  Delusions Grandeur  Perception WDL  Hallucination None reported or observed  Judgment Poor  Confusion None  Danger to Self  Current suicidal ideation? Denies  Danger to Others  Danger to Others None reported or observed   Progress note   D: Pt seen at nurse's station. Pt denies SI, HI, AVH. Pt rates pain  0/10. Pt rates anxiety  0/10 and depression  0/10. Pt just seen running down hallway. When cautioned to stop, pt just kept running. Answers questions appropriately but also makes random statements that have nothing to do with the conversation. Pt also educated on the need to minimize snacks d/t his elevated blood sugar. No other concerns noted at this time.  A: Pt provided support and encouragement. Pt given scheduled medication as prescribed. PRNs as appropriate. Q15 min checks for safety.   R: Pt safe on the unit. Will continue to monitor.

## 2023-12-27 NOTE — Progress Notes (Signed)
 The patient is loud and agitated at this time. This author checked the patient's blood sugar and it was 367. Within approximately 15 minutes, the patient approached the nurse's station and asked for ice cream and was told "no" due to his blood sugar. He became loud and agitated with this Thereasa Parkin and started talking about having a Pharmacist, community". He eventually went into the dayroom and went past the dayroom mental health tech and grabbed an ice cream.

## 2023-12-27 NOTE — Discharge Instructions (Signed)
 Continue your current insulin regimen.  Maintain low glycemic diet to avoid sugar spikes.  Drink plenty of water to stay hydrated.  Return to the emergency department for any new or worsening symptoms of concern.

## 2023-12-27 NOTE — Progress Notes (Signed)
 Pt in bedroom at 2200. Calmer presence than before.

## 2023-12-27 NOTE — Progress Notes (Signed)
 New diabetes management orders placed for this patient. Pt administered additional insulin per new orders. See MAR. Education provided. Pt cooperative during med administration. Will continue to monitor.

## 2023-12-27 NOTE — Progress Notes (Addendum)
 Patient blood glucose level read to high or greater than 600 on the glucometer at 1321. 15 unit insulin given  to patient. Dr. Renaldo Fiddler notified. Patient is safe on the unit. Staff will continue to provide support to patient.

## 2023-12-27 NOTE — H&P (Addendum)
 Psychiatric Admission Assessment Adult  Patient Identification: Jacob [name] MRN:  [id number] Date of Evaluation:  12/27/2023 Chief Complaint:  Schizoaffective disorder (HCC) [F25.9] Principal Diagnosis: Schizoaffective disorder (HCC) Diagnosis:  Principal Problem:   Schizoaffective disorder (HCC) Active Problems:   Uncontrolled type 1 diabetes mellitus with hyperglycemia, with long-term current use of insulin (HCC)  History of Present Illness:  Jacob [name] is a 33 yr old male who presented to [facility] on 2/21 in DKA, he was admitted and treated and then admitted to [facility] on 2/25.  PPHx is significant for Schizoaffective Disorder, Bipolar Type and Medication Non-Compliance, 1 Suicide Attempt (2019), and Multiple Psychiatric Hospitalizations [facility], old [place], possibly [facility], [facility] and [facility] "their salad bar slaps").  When asked why he was admitted to the hospital he reports that his mother was doing a criminal investigation with the sheriff's department.  He reports he has lots of stocks.  He reports that his family thought he was going to be killed but his baby mamma and her family.  He then reports that his baby mamma is pregnant with 2 fetuses.  He reports one fetus took nutrients from the other one.  He reports that he then took this fetus and "made it my fetus."  He then reports "why do I need Social Security when I got stocks with bonds."  He reports that since he has been investing by not buying lunch at school since middle school he should now have billions of dollars.  He reports that he has been admitted to the psychiatric facility multiple times because it was written in his grandmother's will.  He then repeated the phrase "eat and throw up" 3 times to "get my mind in order."  He reports no significant past psychiatric history (per chart review significant for schizoaffective disorder, bipolar type and medication noncompliance).  He reports no  history of suicide attempts (per chart review 1 suicide attempt 2019).  He reports no history of self-injurious behavior.  He reports multiple prior psychiatric hospitalizations (per chart review [facility], old [place], possibly [facility], [facility] and [facility] "their salad bar slaps").  He reports past medical history significant for diabetes.  He reports no significant past surgical history.  He reports no history of head trauma.  He reports no history of seizures.  He reports NKDA.  He reports he currently lives with his grandma.  He reports he graduated high school.  He reports some college.  He reports he is currently unemployed.  He reports no alcohol use.  He reports he vapes nicotine.  He reports rare THC use and "other things."  When asked about current legal issues he reports he has a 50 be on his grandmother and his grandmother has a 50 be on him which is why he can no longer live with her and is currently homeless.  He then reports he has got 1 charge for attempted murder on the [place], 2 charges of attempted murder on the [facility], and 3...".  He reports no access to firearms.  Discussed medications with him.  He reports he used to be on an injection when he was still working.  He reports that it was too much of a bother to get the injection and so he does not want that.  He reports he is willing to take pills but will not take an injection.  He reports no other concerns at present.   Notified by nursing staff  that patient's CBG was on readable by the monitor.  Staff reported that insulin had been given and after a recheck CBG was still unreadable.  Discussed with them that patient would need to be transferred to [facility].  Called [facility] and spoke with Dr. Andria Meuse.  Discussed patient's unreadable CBG levels along with vitals.  He accepted the patient.   Associated Signs/Symptoms: Depression Symptoms:   Reports None (Hypo) Manic Symptoms:   Reports  None Anxiety Symptoms:  Excessive Worry, Psychotic Symptoms:   Reports None but patient is delusional and paranoid PTSD Symptoms: Reports None Total Time spent with patient: 1 hour  Past Psychiatric History:  Schizoaffective Disorder, Bipolar Type and Medication Non-Compliance, 1 Suicide Attempt (2019), and Multiple Psychiatric Hospitalizations [facility], old [place], possibly [facility], [facility] and [facility] "their salad bar slaps").  Is the patient at risk to self? Yes.    Has the patient been a risk to self in the past 6 months? Yes.    Has the patient been a risk to self within the distant past? Yes.    Is the patient a risk to others? No.  Has the patient been a risk to others in the past 6 months? No.  Has the patient been a risk to others within the distant past? No.   [place] Scale:  Flowsheet Row ED to Hosp-Admission (Current) from 12/26/2023 in [facility] Emergency Department at [facility] ED to Hosp-Admission (Discharged) from 12/23/2023 in [place] [facility] PROGRESSIVE [facility]ARE ED from 12/16/2023 in [facility] Emergency Department at [facility]  C-SSRS RISK CATEGORY No Risk No Risk No Risk        Prior Inpatient Therapy: Yes.   If yes, describe [facility] 06/2020  Prior Outpatient Therapy: Yes.   If yes, describe  [facility]  Alcohol Screening: 1. How often do you have a drink containing alcohol?: Never 2. How many drinks containing alcohol do you have on a typical day when you are drinking?: 1 or 2 3. How often do you have six or more drinks on one occasion?: Never AUDIT-C Score: 0 4. How often during the last year have you found that you were not able to stop drinking once you had started?: Never 5. How often during the last year have you failed to do what was normally expected from you because of drinking?: Never 6. How often during the last year have you needed a first drink in the morning to get yourself going after a heavy drinking  session?: Never 7. How often during the last year have you had a feeling of guilt of remorse after drinking?: Never 8. How often during the last year have you been unable to remember what happened the night before because you had been drinking?: Never 9. Have you or someone else been injured as a result of your drinking?: No 10. Has a relative or friend or a doctor or another health worker been concerned about your drinking or suggested you cut down?: No Alcohol Use Disorder Identification Test Final Score (AUDIT): 0 Substance Abuse History in the last 12 months:  No. Consequences of Substance Abuse: NA Previous Psychotropic Medications: Yes  Abilify 15 mg daily, Abilify Maintenna 400 mg q 4 weeks, Invega sustenna 156 mg q 4 weeks, hydroxyzine, fluoxetine, olanzapine, risperidone, sertraline, temazepam, ziprasidone  Psychological Evaluations: No  Past Medical History:  Past Medical History:  Diagnosis Date   Bipolar 1 disorder (HCC)    History of attempted suicide  2019   Unsuccessful suicide attempt in 2019 with rat poison   Schizoaffective disorder (HCC)    Type 1 diabetes mellitus on insulin therapy (HCC) 2019   History reviewed. No pertinent surgical history. Family History:  Family History  Problem Relation Age of Onset   Healthy Mother    Healthy Father    Family Psychiatric  History:  Mother- Crack Cocaine Abuse "Don't Really Know"  Tobacco Screening:  Social History   Tobacco Use  Smoking Status Former   Types: Cigars, Cigarettes  Smokeless Tobacco Never  Tobacco Comments   2 BLACK AND MILD A DAY    BH Tobacco Counseling     Are you interested in Tobacco Cessation Medications?  No value filed. Counseled patient on smoking cessation:  No value filed. Reason Tobacco Screening Not Completed: No value filed.       Social History:  Social History   Substance and Sexual Activity  Alcohol Use Yes   Comment: social/occassional     Social History   Substance  and Sexual Activity  Drug Use Not Currently   Types: Marijuana    Additional Social History: Marital status: Single Are you sexually active?: Yes What is your sexual orientation?: Straight Has your sexual activity been affected by drugs, alcohol, medication, or emotional stress?: UTA Does patient have children?: Yes How is patient's relationship with their children?: UTA                         Allergies:  No Known Allergies Lab Results:  Results for orders placed or performed during the hospital encounter of 12/26/23 (from the past 48 hours)  Glucose, capillary     Status: Abnormal   Collection Time: 12/26/23  8:36 PM  Result Value Ref Range   Glucose-Capillary 594 (HH) 70 - 99 mg/dL    Comment: Glucose reference range applies only to samples taken after fasting for at least 8 hours.   Comment 1 Notify RN   Glucose, capillary     Status: Abnormal   Collection Time: 12/26/23 11:42 PM  Result Value Ref Range   Glucose-Capillary 332 (H) 70 - 99 mg/dL    Comment: Glucose reference range applies only to samples taken after fasting for at least 8 hours.  Glucose, capillary     Status: Abnormal   Collection Time: 12/27/23  5:32 AM  Result Value Ref Range   Glucose-Capillary 164 (H) 70 - 99 mg/dL    Comment: Glucose reference range applies only to samples taken after fasting for at least 8 hours.  Lipid panel     Status: Abnormal   Collection Time: 12/27/23  6:32 AM  Result Value Ref Range   Cholesterol 200 0 - 200 mg/dL   Triglycerides 161 (H) <150 mg/dL   HDL 84 >09 mg/dL   Total CHOL/HDL Ratio 2.4 RATIO   VLDL 39 0 - 40 mg/dL   LDL Cholesterol 77 0 - 99 mg/dL    Comment:        Total Cholesterol/HDL:CHD Risk Coronary Heart Disease Risk Table                     Men   Women  1/2 Average Risk   3.4   3.3  Average Risk       5.0   4.4  2 X Average Risk   9.6   7.1  3 X Average Risk  23.4   11.0  Use the calculated Patient Ratio above and the CHD Risk  Table to determine the patient's CHD Risk.        ATP III CLASSIFICATION (LDL):  <100     mg/dL   Optimal  347-425  mg/dL   Near or Above                    Optimal  130-159  mg/dL   Borderline  956-387  mg/dL   High  >564     mg/dL   Very High Performed at [facility], [address], [place], [place] [address]   Hepatic function panel     Status: Abnormal   Collection Time: 12/27/23  6:32 AM  Result Value Ref Range   Total Protein 6.3 (L) 6.5 - 8.1 g/dL   Albumin 3.5 3.5 - 5.0 g/dL   AST 67 (H) 15 - 41 U/L   ALT 52 (H) 0 - 44 U/L   Alkaline Phosphatase 84 38 - 126 U/L   Total Bilirubin 0.5 0.0 - 1.2 mg/dL   Bilirubin, Direct <1.8 0.0 - 0.2 mg/dL   Indirect Bilirubin NOT CALCULATED 0.3 - 0.9 mg/dL    Comment: Performed at [facility], [address] [place], [place] [address]  TSH     Status: None   Collection Time: 12/27/23  6:32 AM  Result Value Ref Range   TSH 3.661 0.350 - 4.500 uIU/mL    Comment: Performed by a 3rd Generation assay with a functional sensitivity of <=0.01 uIU/mL. Performed at [facility], [address] [place], [place] [address]   Glucose, capillary     Status: Abnormal   Collection Time: 12/27/23  1:18 PM  Result Value Ref Range   Glucose-Capillary >600 (HH) 70 - 99 mg/dL    Comment: Glucose reference range applies only to samples taken after fasting for at least 8 hours.  Glucose, capillary     Status: Abnormal   Collection Time: 12/27/23  1:47 PM  Result Value Ref Range   Glucose-Capillary >600 (HH) 70 - 99 mg/dL    Comment: Glucose reference range applies only to samples taken after fasting for at least 8 hours.  CBG monitoring     Status: Abnormal   Collection Time: 12/27/23  2:47 PM  Result Value Ref Range   Glucose-Capillary >600 (HH) 70 - 99 mg/dL    Comment: Glucose reference range applies only to samples taken after fasting for at least 8 hours.    Blood Alcohol level:  Lab  Results  Component Value Date   ETH <10 12/23/2023   ETH <10 12/12/2023    Metabolic Disorder Labs:  Lab Results  Component Value Date   HGBA1C 11.4 (H) 10/23/2023   MPG 280.48 10/23/2023   MPG >398 07/28/2020   No results found for: "PROLACTIN" Lab Results  Component Value Date   CHOL 200 12/27/2023   TRIG 194 (H) 12/27/2023   HDL 84 12/27/2023   CHOLHDL 2.4 12/27/2023   VLDL 39 12/27/2023   LDLCALC 77 12/27/2023   LDLCALC 125 (H) 03/03/2022    Current Medications: Current Facility-Administered Medications  Medication Dose Route Frequency Provider Last Rate Last Admin   acetaminophen (TYLENOL) tablet 650 mg  650 mg Oral Q6H PRN Margaretmary Dys, MD       alum & mag hydroxide-simeth (MAALOX/MYLANTA) 200-200-20 MG/5ML suspension 30 mL  30 mL Oral Q4H PRN Margaretmary Dys, MD  haloperidol (HALDOL) tablet 5 mg  5 mg Oral TID PRN Margaretmary Dys, MD       And   diphenhydrAMINE (BENADRYL) capsule 50 mg  50 mg Oral TID PRN Margaretmary Dys, MD       haloperidol lactate (HALDOL) injection 5 mg  5 mg Intramuscular TID PRN Margaretmary Dys, MD       And   diphenhydrAMINE (BENADRYL) injection 50 mg  50 mg Intramuscular TID PRN Margaretmary Dys, MD       And   LORazepam (ATIVAN) injection 2 mg  2 mg Intramuscular TID PRN Margaretmary Dys, MD       haloperidol lactate (HALDOL) injection 10 mg  10 mg Intramuscular TID PRN Margaretmary Dys, MD       And   diphenhydrAMINE (BENADRYL) injection 50 mg  50 mg Intramuscular TID PRN Margaretmary Dys, MD       And   LORazepam (ATIVAN) injection 2 mg  2 mg Intramuscular TID PRN Margaretmary Dys, MD       hydrOXYzine (ATARAX) tablet 10 mg  10 mg Oral TID PRN Margaretmary Dys, MD   10 mg at 12/27/23 0013   insulin aspart (novoLOG) injection 0-15 Units  0-15 Units Subcutaneous TID  WC Onuoha, Chinwendu V, NP   15 Units at 12/27/23 1321   insulin aspart (novoLOG) injection 0-5 Units  0-5 Units Subcutaneous QHS Onuoha, Chinwendu V, NP   5 Units at 12/26/23 2119   magnesium hydroxide (MILK OF MAGNESIA) suspension 30 mL  30 mL Oral Daily PRN Margaretmary Dys, MD       nicotine (NICODERM CQ - dosed in mg/24 hours) patch 14 mg  14 mg Transdermal Q0600 Margaretmary Dys, MD       traZODone (DESYREL) tablet 50 mg  50 mg Oral QHS PRN Margaretmary Dys, MD   50 mg at 12/27/23 0013   Current Outpatient Medications  Medication Sig Dispense Refill   benztropine (COGENTIN) 1 MG tablet Take 1 tablet (1 mg total) by mouth at bedtime.     Continuous Glucose Sensor (DEXCOM G7 SENSOR) MISC Place new sensor every 10 days. Use to continuously monitor blood sugar. 3 each 11   glucose blood (ACCU-CHEK GUIDE TEST) test strip Use up to 3 strips a day when not wearing Continuous glucose monitor 50 each 12   Insulin Disposable Pump (OMNIPOD 5 DEXG7G6 PODS GEN 5) MISC Use to administer up to 50 units, change pods every 2 days (Patient not taking: Reported on 12/17/2023) 15 each 11   insulin glargine (LANTUS) 100 UNIT/ML injection Inject 0.24 mLs (24 Units total) into the skin daily.     insulin lispro (HUMALOG) 100 UNIT/ML KwikPen Inject 10 Units into the skin 3 (three) times daily before meals.     Insulin Pen Needle (BD PEN NEEDLE NANO 2ND GEN) 32G X 4 MM MISC Use to inject insulin up to 4 times a day 200 each 3   Insulin Pen Needle (BD PEN NEEDLE NANO U/F) 32G X 4 MM MISC Use  3  times daily with insulin. 100 each 0   Lancet Device MISC 1 each by Does not apply route 3 (three) times daily. May dispense any manufacturer covered by patient's insurance. 1 each 0   risperiDONE (RISPERDAL) 1 MG tablet Take 1 tablet (1 mg total) by mouth 2 (two) times daily.  PTA Medications: (Not in a hospital admission)   Musculoskeletal: Strength & Muscle Tone: within  normal limits Gait & Station: normal Patient leans: N/A            Psychiatric Specialty Exam:  Presentation  General Appearance:  Casual  Eye Contact: Fleeting  Speech: -- (rapid, occasionally garbled)  Speech Volume: Normal  Handedness: Left   Mood and Affect  Mood: Anxious; Irritable  Affect: Labile   Thought Process  Thought Processes: Disorganized  Duration of Psychotic Symptoms: greater than 6 months Past Diagnosis of Schizophrenia or Psychoactive disorder: Yes  Descriptions of Associations:Tangential  Orientation:Partial  Thought Content:Scattered; Delusions; Paranoid Ideation  Hallucinations:Hallucinations: None  Ideas of Reference:None  Suicidal Thoughts:Suicidal Thoughts: No  Homicidal Thoughts:Homicidal Thoughts: No   Sensorium  Memory: Immediate Poor; Recent Poor  Judgment: Impaired  Insight: Lacking   Executive Functions  Concentration: Poor  Attention Span: Poor  Recall: Poor  Fund of Knowledge: Poor  Language: Fair   Psychomotor Activity  Psychomotor Activity:Psychomotor Activity: Normal   Assets  Assets: Communication Skills; Resilience   Sleep  Sleep:Sleep: Fair    Physical Exam: Physical Exam Vitals and nursing note reviewed.  Constitutional:      General: He is not in acute distress.    Appearance: Normal appearance. He is normal weight. He is not ill-appearing or toxic-appearing.  HENT:     Head: Normocephalic and atraumatic.  Pulmonary:     Effort: Pulmonary effort is normal.  Musculoskeletal:        General: Normal range of motion.  Neurological:     General: No focal deficit present.     Mental Status: He is alert.    Review of Systems  Respiratory:  Negative for cough and shortness of breath.   Cardiovascular:  Negative for chest pain.  Gastrointestinal:  Negative for abdominal pain, constipation, diarrhea, nausea and vomiting.  Neurological:  Negative for dizziness,  weakness and headaches.  Psychiatric/Behavioral:  Positive for hallucinations (Reports None but responding to internal stimuli). Negative for depression and suicidal ideas. The patient is nervous/anxious.    Blood pressure 135/81, pulse 83, temperature 98.7 F (37.1 C), temperature source Oral, resp. rate 18, height 5\' 9"  (1.753 m), weight 129 lb (58.5 kg), SpO2 100%. Body mass index is 19.05 kg/m.  Treatment Plan Summary: Daily contact with patient to assess and evaluate symptoms and progress in treatment and Medication management  Jacob [name] is a 33 yr old male who presented to [facility] on 2/21 in DKA, he was admitted and treated and then admitted to [facility] on 2/25.  PPHx is significant for Schizoaffective Disorder, Bipolar Type and Medication Non-Compliance, 1 Suicide Attempt (2019), and Multiple Psychiatric Hospitalizations [facility]), old [place], possibly [facility], [facility] and [facility] "their salad bar slaps").   Mariusz Stoutenburg is delusional/disorganized and paranoid.  Currently his blood glucose is too high for the monitor to register.  Due to risk of DKA he is being sent to [facility].  Once he returns we will see how much disorganized/paranoid thought remains and then consider restarting Risperdal.  Called the ED provider Dr. Andria Meuse who accepted patient.  We will continue to monitor.   Observation Level/Precautions:  15 minute checks  Laboratory:  BMP: WNL except  Na: 132,  Cl: 95, Ca: 8.8,  Hep Fun: WNL except Tot Pro: 6.3,  AST: 67,  ALT: 52, CBC: WNL except WBC: 3.6,  HCT: 38.4,  TSH: 3.661, EKG: Sinus Tach w/ Qtc: 426 A1c (1/15):  9.4  Psychotherapy:    Medications:    Consultations:    Discharge Concerns:    Estimated LOS:  Other:  Sent to [facility] due to blood glucose   Physician Treatment Plan for Primary Diagnosis: Schizoaffective disorder (HCC) Long Term Goal(s): Improvement in symptoms so as ready for discharge  Short Term Goals: Ability to identify  changes in lifestyle to reduce recurrence of condition will improve, Ability to maintain clinical measurements within normal limits will improve, Compliance with prescribed medications will improve, and Ability to identify triggers associated with substance abuse/mental health issues will improve  Physician Treatment Plan for Secondary Diagnosis: Principal Problem:   Schizoaffective disorder (HCC) Active Problems:   Uncontrolled type 1 diabetes mellitus with hyperglycemia, with long-term current use of insulin (HCC)  Long Term Goal(s): Improvement in symptoms so as ready for discharge  Short Term Goals: Ability to identify changes in lifestyle to reduce recurrence of condition will improve, Ability to maintain clinical measurements within normal limits will improve, Compliance with prescribed medications will improve, and Ability to identify triggers associated with substance abuse/mental health issues will improve  I certify that inpatient services furnished can reasonably be expected to improve the patient's condition.    Lauro Franklin, MD 2/25/20253:30 PM

## 2023-12-27 NOTE — [facility] Suicide Risk Assessment (Signed)
 Suicide Risk Assessment  Admission Assessment    [facility] Admission Suicide Risk Assessment   Nursing information obtained from:  Patient Demographic factors:  Male, Low socioeconomic status, Unemployed Current Mental Status:  NA Loss Factors:  NA Historical Factors:  NA Risk Reduction Factors:  Positive social support  Total Time spent with patient: 1 hour Principal Problem: Schizoaffective disorder (HCC) Diagnosis:  Principal Problem:   Schizoaffective disorder (HCC) Active Problems:   Uncontrolled type 1 diabetes mellitus with hyperglycemia, with long-term current use of insulin (HCC)  Subjective Data:  Jacob [name] is a 33 yr old male who presented to [facility] on 2/21 in DKA, he was admitted and treated and then admitted to [facility] on 2/25.  PPHx is significant for Schizoaffective Disorder, Bipolar Type and Medication Non-Compliance, 1 Suicide Attempt (2019), and Multiple Psychiatric Hospitalizations [facility], old [place], possibly [facility], [facility] and [facility] "their salad bar slaps").   When asked why he was admitted to the hospital he reports that his mother was doing a criminal investigation with the sheriff's department.  He reports he has lots of stocks.  He reports that his family thought he was going to be killed but his baby mamma and her family.  He then reports that his baby mamma is pregnant with 2 fetuses.  He reports one fetus took nutrients from the other one.  He reports that he then took this fetus and "made it my fetus."  He then reports "why do I need Social Security when I got stocks with bonds."  He reports that since he has been investing by not buying lunch at school since middle school he should now have billions of dollars.  He reports that he has been admitted to the psychiatric facility multiple times because it was written in his grandmother's will.  He then repeated the phrase "eat and throw up" 3 times to "get my mind in order."    He reports no significant past psychiatric history (per chart review significant for schizoaffective disorder, bipolar type and medication noncompliance).  He reports no history of suicide attempts (per chart review 1 suicide attempt 2019).  He reports no history of self-injurious behavior.  He reports multiple prior psychiatric hospitalizations (per chart review [facility], old [facility], possibly [facility], [facility] and [facility] "their salad bar slaps").  He reports past medical history significant for diabetes.  He reports no significant past surgical history.  He reports no history of head trauma.  He reports no history of seizures.  He reports NKDA.   He reports he currently lives with his grandma.  He reports he graduated high school.  He reports some college.  He reports he is currently unemployed.  He reports no alcohol use.  He reports he vapes nicotine.  He reports rare THC use and "other things."  When asked about current legal issues he reports he has a 50 be on his grandmother and his grandmother has a 50 be on him which is why he can no longer live with her and is currently homeless.  He then reports he has got 1 charge for attempted murder on the [place], 2 charges of attempted murder on the [facility], and 3...".  He reports no access to firearms.   Discussed medications with him.  He reports he used to be on an injection when he was still working.  He reports that it was too much of a bother to get the injection  and so he does not want that.  He reports he is willing to take pills but will not take an injection.  He reports no other concerns at present.     Notified by nursing staff that patient's CBG was on readable by the monitor.  Staff reported that insulin had been given and after a recheck CBG was still unreadable.  Discussed with them that patient would need to be transferred to [facility].   Called [facility] and spoke with Dr. Andria Meuse.  Discussed patient's  unreadable CBG levels along with vitals.  He accepted the patient.  Continued Clinical Symptoms:  Alcohol Use Disorder Identification Test Final Score (AUDIT): 0 The "Alcohol Use Disorders Identification Test", Guidelines for Use in Primary Care, Second Edition.  World Science writer [facility]). Score between 0-7:  no or low risk or alcohol related problems. Score between 8-15:  moderate risk of alcohol related problems. Score between 16-19:  high risk of alcohol related problems. Score 20 or above:  warrants further diagnostic evaluation for alcohol dependence and treatment.   CLINICAL FACTORS:   Currently Psychotic Unstable or Poor Therapeutic Relationship Previous Psychiatric Diagnoses and Treatments Medical Diagnoses and Treatments/Surgeries   Musculoskeletal: Strength & Muscle Tone: within normal limits Gait & Station: normal Patient leans: N/A  Psychiatric Specialty Exam:  Presentation  General Appearance:  Casual  Eye Contact: Fleeting  Speech: -- (rapid, occasionally garbled)  Speech Volume: Normal  Handedness: Left   Mood and Affect  Mood: Anxious; Irritable  Affect: Labile   Thought Process  Thought Processes: Disorganized  Descriptions of Associations:Tangential  Orientation:Partial  Thought Content:Scattered; Delusions; Paranoid Ideation  History of Schizophrenia/Schizoaffective disorder:Yes  Duration of Psychotic Symptoms:Greater than six months  Hallucinations:Hallucinations: None  Ideas of Reference:None  Suicidal Thoughts:Suicidal Thoughts: No  Homicidal Thoughts:Homicidal Thoughts: No   Sensorium  Memory: Immediate Poor; Recent Poor  Judgment: Impaired  Insight: Lacking   Executive Functions  Concentration: Poor  Attention Span: Poor  Recall: Poor  Fund of Knowledge: Poor  Language: Fair   Psychomotor Activity  Psychomotor Activity:Psychomotor Activity: Normal   Assets  Assets: Communication  Skills; Resilience   Sleep  Sleep:Sleep: Fair    Physical Exam: Physical Exam Vitals and nursing note reviewed.  Constitutional:      General: He is not in acute distress.    Appearance: Normal appearance. He is normal weight. He is not ill-appearing or toxic-appearing.  HENT:     Head: Normocephalic and atraumatic.  Pulmonary:     Effort: Pulmonary effort is normal.  Musculoskeletal:        General: Normal range of motion.  Neurological:     General: No focal deficit present.     Mental Status: He is alert.    Review of Systems  Respiratory:  Negative for cough and shortness of breath.   Cardiovascular:  Negative for chest pain.  Gastrointestinal:  Negative for abdominal pain, constipation, diarrhea, nausea and vomiting.  Neurological:  Negative for dizziness, weakness and headaches.  Psychiatric/Behavioral:  Positive for hallucinations (Reports none but responding to internal stimuli). Negative for depression and suicidal ideas. The patient is nervous/anxious.    Blood pressure 135/81, pulse 83, temperature 98.7 F (37.1 C), temperature source Oral, resp. rate 18, height 5\' 9"  (1.753 m), weight 129 lb (58.5 kg), SpO2 100%. Body mass index is 19.05 kg/m.   COGNITIVE FEATURES THAT CONTRIBUTE TO RISK:  Closed-mindedness, Loss of executive function, and Thought constriction (tunnel vision)    SUICIDE RISK:   Moderate:  Frequent  suicidal ideation with limited intensity, and duration, some specificity in terms of plans, no associated intent, good self-control, limited dysphoria/symptomatology, some risk factors present, and identifiable protective factors, including available and accessible social support.  PLAN OF CARE:  Jacob [name] is a 33 yr old male who presented to [facility] on 2/21 in DKA, he was admitted and treated and then admitted to [facility] on 2/25.  PPHx is significant for Schizoaffective Disorder, Bipolar Type and Medication Non-Compliance, 1 Suicide Attempt (2019), and  Multiple Psychiatric Hospitalizations [facility], old [place], possibly [facility], [facility] and [facility] "their salad bar slaps").     Jacob [name] is delusional/disorganized and paranoid.  Currently his blood glucose is too high for the monitor to register.  Due to risk of DKA he is being sent to [facility].  Once he returns we will see how much disorganized/paranoid thought remains and then consider restarting Risperdal.  Called the ED provider Dr. Andria Meuse who accepted patient.  We will continue to monitor.  I certify that inpatient services furnished can reasonably be expected to improve the patient's condition.   Lauro Franklin, MD 12/27/2023, 3:38 PM

## 2023-12-27 NOTE — Group Note (Signed)
 Recreation Therapy Group Note   Group Topic:Animal Assisted Therapy   Group Date: 12/27/2023 Start Time: 0945 End Time: 1030 Facilitators: Montarius Kitagawa-McCall, LRT,CTRS Location: [address] Dayroom   Animal-Assisted Activity (AAA) Program Checklist/Progress Notes Patient Eligibility Criteria Checklist & Daily Group note for Rec Tx Intervention  AAA/T Program Assumption of Risk Form signed by Patient/ or Parent Legal Guardian Yes  Patient is free of allergies or severe asthma Yes  Patient reports no fear of animals Yes  Patient reports no history of cruelty to animals Yes  Patient understands his/her participation is voluntary Yes  Patient washes hands before animal contact Yes  Patient washes hands after animal contact Yes  Education: Hand Washing, Appropriate Animal Interaction   Education Outcome: Acknowledges education.    Affect/Mood: Appropriate   Participation Level: Minimal   Participation Quality: Independent   Behavior: On-looking   Speech/Thought Process: None   Insight: None   Judgement: None   Modes of Intervention: Teaching laboratory technician   Patient Response to Interventions:  Receptive   Education Outcome:  In group clarification offered    Clinical Observations/Individualized Feedback: Pt was quiet but engaged with [place] when he first came into group. Pt was observant for the remainder of time he was in group. Pt came in and out of group a few times before not returning.    Plan: Continue to engage patient in RT group sessions 2-3x/week.   Aeryn Medici-McCall, LRT,CTRS 12/27/2023 1:51 PM

## 2023-12-27 NOTE — Plan of Care (Signed)
 Pt denies SI/HI/AVH. Pt is anxious and cooperative. On-call provider contacted d/t pt's DM1 history. Pt was given scheduled medications. Pt encouraged to attend groups. Q 15 minute safety checks performed/ongoing. Pt has no complaints at this time. Pt receptive to treatment and safety maintained on unit.  Problem: Education: Goal: Knowledge of  General Education information/materials will improve Outcome: Progressing Goal: Emotional status will improve Outcome: Progressing Goal: Mental status will improve Outcome: Progressing Goal: Verbalization of understanding the information provided will improve Outcome: Progressing   Problem: Activity: Goal: Interest or engagement in activities will improve Outcome: Progressing Goal: Sleeping patterns will improve Outcome: Progressing   Problem: Coping: Goal: Ability to verbalize frustrations and anger appropriately will improve Outcome: Progressing Goal: Ability to demonstrate self-control will improve Outcome: Progressing   Problem: Health Behavior/Discharge Planning: Goal: Identification of resources available to assist in meeting health care needs will improve Outcome: Progressing Goal: Compliance with treatment plan for underlying cause of condition will improve Outcome: Progressing   Problem: Physical Regulation: Goal: Ability to maintain clinical measurements within normal limits will improve Outcome: Progressing   Problem: Safety: Goal: Periods of time without injury will increase Outcome: Progressing   Problem: Education: Goal: Ability to describe self-care measures that may prevent or decrease complications (Diabetes Survival Skills Education) will improve Outcome: Progressing Goal: Individualized Educational Video(s) Outcome: Progressing   Problem: Coping: Goal: Ability to adjust to condition or change in health will improve Outcome: Progressing   Problem: Fluid Volume: Goal: Ability to maintain a balanced  intake and output will improve Outcome: Progressing   Problem: Health Behavior/Discharge Planning: Goal: Ability to identify and utilize available resources and services will improve Outcome: Progressing Goal: Ability to manage health-related needs will improve Outcome: Progressing   Problem: Metabolic: Goal: Ability to maintain appropriate glucose levels will improve Outcome: Progressing   Problem: Nutritional: Goal: Maintenance of adequate nutrition will improve Outcome: Progressing Goal: Progress toward achieving an optimal weight will improve Outcome: Progressing   Problem: Skin Integrity: Goal: Risk for impaired skin integrity will decrease Outcome: Progressing   Problem: Tissue Perfusion: Goal: Adequacy of tissue perfusion will improve Outcome: Progressing

## 2023-12-27 NOTE — ED Provider Notes (Signed)
 [place] EMERGENCY DEPARTMENT AT [facility] Provider Note   CSN: [id number] Arrival date & time: 12/27/23  1436     History  Chief Complaint  Patient presents with   Hyperglycemia    Jacob [name] is a 33 y.o. male.   Hyperglycemia Associated symptoms: no abdominal pain, no fatigue, no nausea, no shortness of breath and no vomiting   Patient presents for hyperglycemia.  Medical history includes T1DM, schizoaffective disorder, depression, anxiety, homelessness.  He was admitted 4 days ago for DKA.  At the time, he also had decompensated psychiatric illness and was started on risperidone 1 mg twice daily.  He was discharged yesterday and went to [facility] voluntarily.  Current insulin regimen is 24 units of Lantus daily and 3 units lispro before meals.  Today, patient had recurrence of hyperglycemia at [facility].  2 hours ago, CBG read greater than 600.  He was given 15 units of insulin.  He was sent to the ED for further evaluation.  Patient states that he did get his nighttime insulin last night.  He did not get his Premeal insulin today before breakfast or lunch.  He denies any physical complaints.     Home Medications Prior to Admission medications   Medication Sig Start Date End Date Taking? Authorizing Provider  benztropine (COGENTIN) 1 MG tablet Take 1 tablet (1 mg total) by mouth at bedtime. 12/26/23   Lovie Macadamia, MD  Continuous Glucose Sensor (DEXCOM G7 SENSOR) MISC Place new sensor every 10 days. Use to continuously monitor blood sugar. 11/16/23   Katheran James, DO  glucose blood (ACCU-CHEK GUIDE TEST) test strip Use up to 3 strips a day when not wearing Continuous glucose monitor 11/16/23   Katheran James, DO  Insulin Disposable Pump (OMNIPOD 5 DEXG7G6 PODS GEN 5) MISC Use to administer up to 50 units, change pods every 2 days Patient not taking: Reported on 12/17/2023 11/10/23   [name], MD  insulin glargine (LANTUS) 100 UNIT/ML injection Inject 0.24 mLs  (24 Units total) into the skin daily. 12/26/23   Lovie Macadamia, MD  insulin lispro (HUMALOG) 100 UNIT/ML KwikPen Inject 10 Units into the skin 3 (three) times daily before meals. 12/26/23   Lovie Macadamia, MD  Insulin Pen Needle (BD PEN NEEDLE NANO 2ND GEN) 32G X 4 MM MISC Use to inject insulin up to 4 times a day 11/16/23   Katheran James, DO  Insulin Pen Needle (BD PEN NEEDLE NANO U/F) 32G X 4 MM MISC Use  3  times daily with insulin. 11/15/23   Chauncey Mann, DO  Lancet Device MISC 1 each by Does not apply route 3 (three) times daily. May dispense any manufacturer covered by patient's insurance. 10/24/23   Morrie Sheldon, MD  risperiDONE (RISPERDAL) 1 MG tablet Take 1 tablet (1 mg total) by mouth 2 (two) times daily. 12/26/23   Lovie Macadamia, MD      Allergies    Patient has no known allergies.    Review of Systems   Review of Systems  Constitutional:  Negative for fatigue.  Respiratory:  Negative for shortness of breath.   Gastrointestinal:  Negative for abdominal pain, nausea and vomiting.  All other systems reviewed and are negative.   Physical Exam Updated Vital Signs BP (!) 123/92   Pulse 82   Temp 98.1 F (36.7 C) (Oral)   Resp 16   Ht 5\' 9"  (1.753 m)   Wt 58.5 kg   SpO2 100%   BMI 19.05 kg/m  Physical Exam Vitals and nursing note reviewed.  Constitutional:      General: He is not in acute distress.    Appearance: Normal appearance. He is well-developed. He is not ill-appearing, toxic-appearing or diaphoretic.  HENT:     Head: Normocephalic and atraumatic.     Right Ear: External ear normal.     Left Ear: External ear normal.     Nose: Nose normal.     Mouth/Throat:     Mouth: Mucous membranes are moist.  Eyes:     Extraocular Movements: Extraocular movements intact.     Conjunctiva/sclera: Conjunctivae normal.  Cardiovascular:     Rate and Rhythm: Normal rate and regular rhythm.  Pulmonary:     Effort: Pulmonary effort is normal. No  respiratory distress.  Abdominal:     General: There is no distension.     Palpations: Abdomen is soft.  Musculoskeletal:        General: No swelling. Normal range of motion.     Cervical back: Normal range of motion and neck supple.  Skin:    General: Skin is warm and dry.     Coloration: Skin is not jaundiced or pale.  Neurological:     General: No focal deficit present.     Mental Status: He is alert and oriented to person, place, and time.  Psychiatric:        Mood and Affect: Affect normal. Mood is elated.        Speech: Speech normal.        Behavior: Behavior normal. Behavior is cooperative.        Thought Content: Thought content normal.     ED Results / Procedures / Treatments   Labs (all labs ordered are listed, but only abnormal results are displayed) Labs Reviewed  LIPID PANEL - Abnormal; Notable for the following components:      Result Value   Triglycerides 194 (*)    All other components within normal limits  HEPATIC FUNCTION PANEL - Abnormal; Notable for the following components:   Total Protein 6.3 (*)    AST 67 (*)    ALT 52 (*)    All other components within normal limits  GLUCOSE, CAPILLARY - Abnormal; Notable for the following components:   Glucose-Capillary 594 (*)    All other components within normal limits  GLUCOSE, CAPILLARY - Abnormal; Notable for the following components:   Glucose-Capillary 332 (*)    All other components within normal limits  GLUCOSE, CAPILLARY - Abnormal; Notable for the following components:   Glucose-Capillary 164 (*)    All other components within normal limits  GLUCOSE, CAPILLARY - Abnormal; Notable for the following components:   Glucose-Capillary >600 (*)    All other components within normal limits  GLUCOSE, CAPILLARY - Abnormal; Notable for the following components:   Glucose-Capillary >600 (*)    All other components within normal limits  BLOOD GAS, VENOUS - Abnormal; Notable for the following components:   pH,  Ven 7.44 (*)    pO2, Ven 122 (*)    Bicarbonate 31.2 (*)    Acid-Base Excess 6.1 (*)    All other components within normal limits  URINALYSIS, ROUTINE W REFLEX MICROSCOPIC - Abnormal; Notable for the following components:   Color, Urine COLORLESS (*)    Glucose, UA >=500 (*)    All other components within normal limits  CBG MONITORING, ED - Abnormal; Notable for the following components:   Glucose-Capillary >600 (*)    All other  components within normal limits  CBG MONITORING, ED - Abnormal; Notable for the following components:   Glucose-Capillary 310 (*)    All other components within normal limits  TSH  BETA-HYDROXYBUTYRIC ACID  PROLACTIN    EKG None  Radiology No results found.  Procedures Procedures    Medications Ordered in ED Medications  acetaminophen (TYLENOL) tablet 650 mg (has no administration in time range)  alum & mag hydroxide-simeth (MAALOX/MYLANTA) 200-200-20 MG/5ML suspension 30 mL (has no administration in time range)  magnesium hydroxide (MILK OF MAGNESIA) suspension 30 mL (has no administration in time range)  haloperidol (HALDOL) tablet 5 mg (has no administration in time range)    And  diphenhydrAMINE (BENADRYL) capsule 50 mg (has no administration in time range)  haloperidol lactate (HALDOL) injection 5 mg (has no administration in time range)    And  diphenhydrAMINE (BENADRYL) injection 50 mg (has no administration in time range)    And  LORazepam (ATIVAN) injection 2 mg (has no administration in time range)  haloperidol lactate (HALDOL) injection 10 mg (has no administration in time range)    And  diphenhydrAMINE (BENADRYL) injection 50 mg (has no administration in time range)    And  LORazepam (ATIVAN) injection 2 mg (has no administration in time range)  hydrOXYzine (ATARAX) tablet 10 mg (10 mg Oral Given 12/27/23 0013)  traZODone (DESYREL) tablet 50 mg (50 mg Oral Given 12/27/23 0013)  nicotine (NICODERM CQ - dosed in mg/24 hours) patch 14  mg (14 mg Transdermal Patient Refused/Not Given 12/27/23 0615)  insulin aspart (novoLOG) injection 0-15 Units (15 Units Subcutaneous Given 12/27/23 1321)  insulin aspart (novoLOG) injection 0-5 Units (5 Units Subcutaneous Given 12/26/23 2119)  insulin aspart (novoLOG) injection 3 Units (has no administration in time range)  insulin aspart (novoLOG) injection 10 Units (10 Units Subcutaneous Given 12/26/23 2118)  insulin aspart (novoLOG) injection 4 Units (4 Units Subcutaneous Given 12/27/23 0012)  lactated ringers bolus 2,000 mL (0 mLs Intravenous Stopped 12/27/23 1740)    ED Course/ Medical Decision Making/ A&P                                 Medical Decision Making Amount and/or Complexity of Data Reviewed Labs: ordered.  Risk Prescription drug management.   This patient presents to the ED for concern of hyperglycemia, this involves an extensive number of treatment options, and is a complaint that carries with it a high risk of complications and morbidity.  The differential diagnosis includes medication nonadherence, glycemic diet, DKA, dehydration   Co morbidities that complicate the patient evaluation  T1DM, schizoaffective disorder, depression, anxiety, homelessness   Additional history obtained:  Additional history obtained from N/A External records from outside source obtained and reviewed including EMR   Lab Tests:  I Ordered, and personally interpreted labs.  The pertinent results include: Hyperglycemia without evidence of DKA  Problem List / ED Course / Critical interventions / Medication management  Patient presenting for hyperglycemia.  Recently in the hospital for DKA, discharged yesterday to behavioral health.  CBG this afternoon read greater than 600.  Patient denies any new symptoms.  Will provide IV fluids and check lab work to ensure no DKA recurrence.  Blood gas shows no acidosis.  Beta hydroxybutyrate is normal.  Following IV fluids, sugar improved to 310.   Patient was given correction dose of insulin, 3 units.  Patient stable for discharge back to behavioral health hospital. I  ordered medication including IV fluids for hydration; insulin for hyperglycemia Reevaluation of the patient after these medicines showed that the patient improved I have reviewed the patients home medicines and have made adjustments as needed   Social Determinants of Health:  History of psychiatric illness and homelessness        Final Clinical Impression(s) / ED Diagnoses Final diagnoses:  Hyperglycemia    Rx / DC Orders ED Discharge Orders     None         Gloris Manchester, MD 12/27/23 [facility] [place]

## 2023-12-28 ENCOUNTER — Encounter (HOSPITAL_COMMUNITY): Payer: Self-pay

## 2023-12-28 DIAGNOSIS — F259 Schizoaffective disorder, unspecified: Secondary | ICD-10-CM | POA: Diagnosis not present

## 2023-12-28 LAB — GLUCOSE, CAPILLARY
Glucose-Capillary: 42 mg/dL — CL (ref 70–99)
Glucose-Capillary: 70 mg/dL (ref 70–99)
Glucose-Capillary: 364 mg/dL — ABNORMAL HIGH (ref 70–99)
Glucose-Capillary: 429 mg/dL — ABNORMAL HIGH (ref 70–99)
Glucose-Capillary: 399 mg/dL — ABNORMAL HIGH (ref 70–99)
Glucose-Capillary: 167 mg/dL — ABNORMAL HIGH (ref 70–99)
Glucose-Capillary: 195 mg/dL — ABNORMAL HIGH (ref 70–99)
Glucose-Capillary: 208 mg/dL — ABNORMAL HIGH (ref 70–99)

## 2023-12-28 LAB — PROLACTIN: Prolactin: 18.7 ng/mL (ref 3.9–22.7)

## 2023-12-28 MED ORDER — SODIUM CHLORIDE 0.9 % IN NEBU
INHALATION_SOLUTION | RESPIRATORY_TRACT | Status: AC
  Filled 2023-12-28: qty 3

## 2023-12-28 MED ORDER — ARIPIPRAZOLE 5 MG PO TABS
5.0000 mg | ORAL_TABLET | Freq: Every day | ORAL | Status: DC
  Administered 2023-12-28 – 2024-01-02 (×6): 5 mg via ORAL
  Filled 2023-12-28 (×10): qty 1

## 2023-12-28 NOTE — Plan of Care (Signed)
   Problem: Education: Goal: Emotional status will improve Outcome: Progressing Goal: Mental status will improve Outcome: Progressing   Problem: Activity: Goal: Interest or engagement in activities will improve Outcome: Progressing Goal: Sleeping patterns will improve Outcome: Progressing

## 2023-12-28 NOTE — Progress Notes (Signed)
 Patient has been  put on unit restriction by MD due to eating excessively, which is contributing to elevated blood glucose levels. Patient has also been instructed about no snack but, he goes around other patients asking them for snacks. Sugar had to be removed from the dayroom because he was putting a lot in his coffee. Patient blood glucose level was 429 at 1201, 9 unit of novolog insulin given to patient and blood glucose level came down to 399 at 1236. Dr. Ellwood Dense notified. Patient is safe in the unit at this unit staff will continue to provide support to patient.

## 2023-12-28 NOTE — Group Note (Signed)
 Date:  12/28/2023 Time:  8:24 PM  Group Topic/Focus:  Wrap-Up Group:   The focus of this group is to help patients review their daily goal of treatment and discuss progress on daily workbooks.    Additional Comments:  Pt was encouraged, but opted out of attending wrap up group this evening.   Chrisandra Netters 12/28/2023, 8:24 PM

## 2023-12-28 NOTE — Plan of Care (Signed)
  Problem: Education: Goal: Emotional status will improve Outcome: Progressing Goal: Mental status will improve Outcome: Progressing   Problem: Coping: Goal: Ability to verbalize frustrations and anger appropriately will improve Outcome: Progressing   Problem: Health Behavior/Discharge Planning: Goal: Compliance with treatment plan for underlying cause of condition will improve Outcome: Progressing

## 2023-12-28 NOTE — Progress Notes (Signed)
   12/28/23 0601  15 Minute Checks  Location Bedroom  Visual Appearance Calm  Behavior Composed  Sleep (Behavioral Health Patients Only)  Calculate sleep? (Click Yes once per 24 hr at 0600 safety check) Yes  Documented sleep last 24 hours 7

## 2023-12-28 NOTE — Plan of Care (Signed)
   Problem: Activity: Goal: Interest or engagement in activities will improve Outcome: Progressing   Problem: Coping: Goal: Ability to verbalize frustrations and anger appropriately will improve Outcome: Progressing   Problem: Safety: Goal: Periods of time without injury will increase Outcome: Progressing

## 2023-12-28 NOTE — Group Note (Signed)
 Recreation Therapy Group Note   Group Topic:Stress Management  Group Date: 12/28/2023 Start Time: 8295 End Time: 0952 Facilitators: Tanina Barb-McCall, LRT,CTRS Location: 300 Hall Dayroom   Group Topic: Stress Management   Goal Area(s) Addresses:  Patient will identify positive stress management techniques. Patient will identify benefits of using stress management post d/c.   Behavioral Response:    Intervention: Insight Timer App   Activity: Meditation. LRT and patients went over meditation before listening to meditation presented in group. LRT played a meditation that focused on self-compassion and speaking positive over ones self throughout the course of the day. The meditation also focused on accepting yourself as you presently are while working towards growth. Patients were to listen and follow along as meditation played to get the most out of it.     Education:  Stress Management, Discharge Planning.    Education Outcome: Acknowledges Education   Affect/Mood: Appropriate   Participation Level: Engaged   Participation Quality: Independent   Behavior: Attentive    Speech/Thought Process: Focused   Insight: Good   Judgement: Good   Modes of Intervention: App   Patient Response to Interventions:  Attentive   Education Outcome:  In group clarification offered    Clinical Observations/Individualized Feedback: Pt attended and participated in group session. Pt was focused and attentive during group session.     Plan: Continue to engage patient in RT group sessions 2-3x/week.   Estephania Licciardi-McCall, LRT,CTRS 12/28/2023 1:34 PM

## 2023-12-28 NOTE — Progress Notes (Signed)
 Hypoglycemic Event  CBG: 42   Treatment: 8 oz juice/soda  Symptoms: None  Follow-up CBG: Time:0625 CBG Result:70  Possible Reasons for Event: Other: poor blood glucose control  Comments/MD notified:Pt will eat breakfast today and will let day shift know about event    Victorino December

## 2023-12-28 NOTE — Group Note (Signed)
 Date:  12/28/2023 Time:  4:15 PM  Group Topic/Focus:  Identifying Needs:   The focus of this group is to help patients identify their personal needs that have been historically problematic and identify healthy behaviors to address their needs.    Participation Level:  Minimal  Participation Quality:  Inattentive  Affect:  Lethargic  Cognitive:  Appropriate  Insight: Appropriate  Engagement in Group:  Lacking  Modes of Intervention:  Discussion and Education  Additional Comments:    Jacob [name] 12/28/2023, 4:15 PM

## 2023-12-28 NOTE — BH IP Treatment Plan (Signed)
 Interdisciplinary Treatment and Diagnostic Plan Update  12/28/2023 Time of Session: 1140AM Jacob [name] MRN: [id number]  Principal Diagnosis: Schizoaffective disorder [facility])  Secondary Diagnoses: Principal Problem:   Schizoaffective disorder (HCC) Active Problems:   Uncontrolled type 1 diabetes mellitus with hyperglycemia, with long-term current use of insulin (HCC)   Current Medications:  Current Facility-Administered Medications  Medication Dose Route Frequency Provider Last Rate Last Admin   acetaminophen (TYLENOL) tablet 650 mg  650 mg Oral Q6H PRN Margaretmary Dys, MD       alum & mag hydroxide-simeth (MAALOX/MYLANTA) 200-200-20 MG/5ML suspension 30 mL  30 mL Oral Q4H PRN Margaretmary Dys, MD       ARIPiprazole (ABILIFY) tablet 5 mg  5 mg Oral Daily Lauro Franklin, MD   5 mg at 12/28/23 1017   haloperidol (HALDOL) tablet 5 mg  5 mg Oral TID PRN Margaretmary Dys, MD       And   diphenhydrAMINE (BENADRYL) capsule 50 mg  50 mg Oral TID PRN Margaretmary Dys, MD       haloperidol lactate (HALDOL) injection 5 mg  5 mg Intramuscular TID PRN Margaretmary Dys, MD   5 mg at 12/27/23 2125   And   diphenhydrAMINE (BENADRYL) injection 50 mg  50 mg Intramuscular TID PRN Margaretmary Dys, MD   50 mg at 12/27/23 2126   And   LORazepam (ATIVAN) injection 2 mg  2 mg Intramuscular TID PRN Margaretmary Dys, MD   2 mg at 12/27/23 2126   haloperidol lactate (HALDOL) injection 10 mg  10 mg Intramuscular TID PRN Margaretmary Dys, MD       And   diphenhydrAMINE (BENADRYL) injection 50 mg  50 mg Intramuscular TID PRN Margaretmary Dys, MD       And   LORazepam (ATIVAN) injection 2 mg  2 mg Intramuscular TID PRN Margaretmary Dys, MD       hydrOXYzine (ATARAX) tablet 10 mg  10 mg Oral TID PRN Margaretmary Dys, MD   10 mg at 12/27/23 0013    insulin aspart (novoLOG) injection 0-5 Units  0-5 Units Subcutaneous QHS Onuoha, Chinwendu V, NP   4 Units at 12/27/23 2125   insulin aspart (novoLOG) injection 0-9 Units  0-9 Units Subcutaneous TID WC Onuoha, Chinwendu V, NP   9 Units at 12/28/23 1209   insulin glargine-yfgn (SEMGLEE) injection 24 Units  24 Units Subcutaneous Q2200 Onuoha, Chinwendu V, NP   24 Units at 12/27/23 2217   magnesium hydroxide (MILK OF MAGNESIA) suspension 30 mL  30 mL Oral Daily PRN Margaretmary Dys, MD       nicotine (NICODERM CQ - dosed in mg/24 hours) patch 14 mg  14 mg Transdermal Q0600 Margaretmary Dys, MD       sodium chloride 0.9 % nebulizer solution            traZODone (DESYREL) tablet 50 mg  50 mg Oral QHS PRN Margaretmary Dys, MD   50 mg at 12/27/23 0013   PTA Medications: Medications Prior to Admission  Medication Sig Dispense Refill Last Dose/Taking   benztropine (COGENTIN) 1 MG tablet Take 1 tablet (1 mg total) by mouth at bedtime.   Unknown   Continuous Glucose Sensor (DEXCOM G7 SENSOR) MISC Place new sensor every 10 days. Use to continuously monitor blood sugar. 3 each 11 Unknown   glucose blood (ACCU-CHEK  GUIDE TEST) test strip Use up to 3 strips a day when not wearing Continuous glucose monitor 50 each 12 Unknown   Insulin Disposable Pump (OMNIPOD 5 DEXG7G6 PODS GEN 5) MISC Use to administer up to 50 units, change pods every 2 days (Patient not taking: Reported on 12/17/2023) 15 each 11 Unknown   insulin glargine (LANTUS) 100 UNIT/ML injection Inject 0.24 mLs (24 Units total) into the skin daily.   Unknown   insulin lispro (HUMALOG) 100 UNIT/ML KwikPen Inject 10 Units into the skin 3 (three) times daily before meals.   Unknown   Insulin Pen Needle (BD PEN NEEDLE NANO 2ND GEN) 32G X 4 MM MISC Use to inject insulin up to 4 times a day 200 each 3 Unknown   Insulin Pen Needle (BD PEN NEEDLE NANO U/F) 32G X 4 MM MISC Use  3  times daily with insulin. 100 each 0  Unknown   Lancet Device MISC 1 each by Does not apply route 3 (three) times daily. May dispense any manufacturer covered by patient's insurance. 1 each 0 Unknown   risperiDONE (RISPERDAL) 1 MG tablet Take 1 tablet (1 mg total) by mouth 2 (two) times daily.   Unknown    Patient Stressors:    Patient Strengths:    Treatment Modalities: Medication Management, Group therapy, Case management,  1 to 1 session with clinician, Psychoeducation, Recreational therapy.   Physician Treatment Plan for Primary Diagnosis: Schizoaffective disorder (HCC) Long Term Goal(s): Improvement in symptoms so as ready for discharge   Short Term Goals: Ability to identify changes in lifestyle to reduce recurrence of condition will improve Ability to maintain clinical measurements within normal limits will improve Compliance with prescribed medications will improve Ability to identify triggers associated with substance abuse/mental health issues will improve  Medication Management: Evaluate patient's response, side effects, and tolerance of medication regimen.  Therapeutic Interventions: 1 to 1 sessions, Unit Group sessions and Medication administration.  Evaluation of Outcomes: Not Progressing  Physician Treatment Plan for Secondary Diagnosis: Principal Problem:   Schizoaffective disorder (HCC) Active Problems:   Uncontrolled type 1 diabetes mellitus with hyperglycemia, with long-term current use of insulin (HCC)  Long Term Goal(s): Improvement in symptoms so as ready for discharge   Short Term Goals: Ability to identify changes in lifestyle to reduce recurrence of condition will improve Ability to maintain clinical measurements within normal limits will improve Compliance with prescribed medications will improve Ability to identify triggers associated with substance abuse/mental health issues will improve     Medication Management: Evaluate patient's response, side effects, and tolerance of medication  regimen.  Therapeutic Interventions: 1 to 1 sessions, Unit Group sessions and Medication administration.  Evaluation of Outcomes: Not Progressing   RN Treatment Plan for Primary Diagnosis: Schizoaffective disorder (HCC) Long Term Goal(s): Knowledge of disease and therapeutic regimen to maintain health will improve  Short Term Goals: Ability to remain free from injury will improve, Ability to verbalize frustration and anger appropriately will improve, Ability to demonstrate self-control, Ability to participate in decision making will improve, Ability to verbalize feelings will improve, Ability to disclose and discuss suicidal ideas, Ability to identify and develop effective coping behaviors will improve, and Compliance with prescribed medications will improve  Medication Management: RN will administer medications as ordered by provider, will assess and evaluate patient's response and provide education to patient for prescribed medication. RN will report any adverse and/or side effects to prescribing provider.  Therapeutic Interventions: 1 on 1 counseling sessions, Psychoeducation, Medication administration, Evaluate  responses to treatment, Monitor vital signs and CBGs as ordered, Perform/monitor CIWA, COWS, AIMS and Fall Risk screenings as ordered, Perform wound care treatments as ordered.  Evaluation of Outcomes: Not Progressing   LCSW Treatment Plan for Primary Diagnosis: Schizoaffective disorder (HCC) Long Term Goal(s): Safe transition to appropriate next level of care at discharge, Engage patient in therapeutic group addressing interpersonal concerns.  Short Term Goals: Engage patient in aftercare planning with referrals and resources, Increase social support, Increase ability to appropriately verbalize feelings, Increase emotional regulation, Facilitate acceptance of mental health diagnosis and concerns, Facilitate patient progression through stages of change regarding substance use  diagnoses and concerns, Identify triggers associated with mental health/substance abuse issues, and Increase skills for wellness and recovery  Therapeutic Interventions: Assess for all discharge needs, 1 to 1 time with Social worker, Explore available resources and support systems, Assess for adequacy in community support network, Educate family and significant other(s) on suicide prevention, Complete Psychosocial Assessment, Interpersonal group therapy.  Evaluation of Outcomes: Not Progressing   Progress in Treatment: Attending groups: Yes. Participating in groups: Yes. intrusive Taking medication as prescribed: Yes. Toleration medication: Yes. Family/Significant other contact made: No, will contact:  Penny Pia [phone] Patient understands diagnosis: Yes. Discussing patient identified problems/goals with staff: Yes. Medical problems stabilized or resolved: Yes. Denies suicidal/homicidal ideation: Yes. Issues/concerns per patient self-inventory: No.  New problem(s) identified: No, Describe:  None reported  New Short Term/Long Term Goal(s): medication stabilization, elimination of SI thoughts, development of comprehensive mental wellness plan.    Patient Goals:  "Work on blood sugar and adjust my meds, I am not taking no shot"  Discharge Plan or Barriers: Patient recently admitted. CSW will continue to follow and assess for appropriate referrals and possible discharge planning.    Reason for Continuation of Hospitalization: Delusions  Hallucinations Mania Medication stabilization  Estimated Length of Stay: 5-7 days  Last 3 [place] Suicide Severity Risk Score: Flowsheet Row ED to Hosp-Admission (Current) [facility] Hosp-Admission (Discharged) from 12/23/2023 in [place] [facility] ED from 12/16/2023 in [facility] Emergency Department at [facility]  C-SSRS RISK CATEGORY No Risk No Risk No  Risk       Last PHQ 2/9 Scores:    08/22/2023    1:55 PM 08/01/2023    8:46 AM 06/29/2023    3:06 PM  Depression screen PHQ 2/9  Decreased Interest 0 0 0  Down, Depressed, Hopeless 0 0 0  PHQ - 2 Score 0 0 0  Altered sleeping  0 0  Change in appetite  0 0  Feeling bad or failure about yourself   0 0  Trouble concentrating  0 0  Moving slowly or fidgety/restless  0 0  Suicidal thoughts  0 0  PHQ-9 Score  0 0    Scribe for Treatment Team: Esmeralda Arthur 12/28/2023 12:51 PM

## 2023-12-28 NOTE — [facility] Group Notes (Signed)
 [facility] Group Notes:  (Nursing/MHT/Case Management/Adjunct)  Date:  12/28/2023  Time:  12:06 AM  Type of Therapy:   Wrap-up group  Participation Level:  Active  Participation Quality:  Intrusive  Affect:  Irritable  Cognitive:  Lacking  Insight:  Limited  Engagement in Group:  Distracting  Modes of Intervention:  Education  Summary of Progress/Problems: Pt goal to eat food. Rated day 1/10. Pt was very loud, disruptive during group.   Noah Delaine 12/28/2023, 12:06 AM

## 2023-12-28 NOTE — Progress Notes (Signed)
   12/28/23 2200  Psych Admission Type (Psych Patients Only)  Admission Status Voluntary  Psychosocial Assessment  Patient Complaints Anxiety;Restlessness  Eye Contact Fair  Facial Expression Animated;Anxious  Affect Irritable  Speech Logical/coherent  Interaction Childlike;Needy  Motor Activity Restless  Appearance/Hygiene Unremarkable  Behavior Characteristics Fidgety  Mood Anxious;Irritable  Thought Process  Coherency Circumstantial  Content Delusions  Delusions Paranoid  Perception WDL  Hallucination None reported or observed  Judgment Poor  Confusion None  Danger to Self  Current suicidal ideation? Denies  Danger to Others  Danger to Others None reported or observed

## 2023-12-28 NOTE — Progress Notes (Addendum)
 [facility] Jacob [name] Progress Note  12/28/2023 10:54 AM Jacob [name]  MRN:  [id number] Subjective:   Jacob [name] is a 33 yr old male who presented to [facility] on 2/21 in DKA, he was admitted and treated and then admitted to [facility] on 2/25.  PPHx is significant for Schizoaffective Disorder, Bipolar Type and Medication Non-Compliance, 1 Suicide Attempt (2019), and Multiple Psychiatric Hospitalizations [facility], old [place], possibly [employer], [employer] and [facility] [place] "their salad bar slaps").    Case was discussed in the multidisciplinary team. MAR was reviewed and patient was compliant with medications.  He received PRN agitation medications last night.  He received PRN Hydroxyzine and Trazodone last night.   Psychiatric Team made the following recommendations yesterday: Sent to [facility] for CBG > 600    On interview today patient reports he slept good last night.  He reports his appetite is doing good.  He reports no SI, HI, or AVH.  He reports no Paranoia or Ideas of Reference.  He reports no issues with his medications.  Attempted to discuss his blood sugar levels with him.  Discussed that picking the ice cream for snacks was not the best option as it would significantly increase his blood sugar.  He reports staff last night were trying to tell him not to get it but he grabbed it anyways because he needs it to go to sleep.  He reports "I did not suck on my mom's teddy and not, suck on anyone else's so I have to have milk."  He reports no other concerns at present.   Principal Problem: Schizoaffective disorder (HCC) Diagnosis: Principal Problem:   Schizoaffective disorder (HCC) Active Problems:   Uncontrolled type 1 diabetes mellitus with hyperglycemia, with long-term current use of insulin (HCC)  Total Time spent with patient:  I personally spent 35 minutes on the unit in direct patient care. The direct patient care time included face-to-face time with the patient, reviewing  the patient's chart, communicating with other professionals, and coordinating care. Greater than 50% of this time was spent in counseling or coordinating care with the patient regarding goals of hospitalization, psycho-education, and discharge planning needs.   Past Psychiatric History:  Schizoaffective Disorder, Bipolar Type and Medication Non-Compliance, 1 Suicide Attempt (2019), and Multiple Psychiatric Hospitalizations [facility], old [place], possibly [employer], [employer] and [employer] [place] "their salad bar slaps").  Past Medical History:  Past Medical History:  Diagnosis Date   Bipolar 1 disorder (HCC)    History of attempted suicide 2019   Unsuccessful suicide attempt in 2019 with rat poison   Schizoaffective disorder (HCC)    Type 1 diabetes mellitus on insulin therapy (HCC) 2019   History reviewed. No pertinent surgical history. Family History:  Family History  Problem Relation Age of Onset   Healthy Mother    Healthy Father    Family Psychiatric  History:  Mother- Crack Cocaine Abuse "Don't Really Know"  Social History:  Social History   Substance and Sexual Activity  Alcohol Use Yes   Comment: social/occassional     Social History   Substance and Sexual Activity  Drug Use Not Currently   Types: Marijuana    Social History   Socioeconomic History   Marital status: Single    Spouse name: Not on file   Number of children: Not on file   Years of education: Not on file   Highest education level: Not on file  Occupational History   Not on  file  Tobacco Use   Smoking status: Former    Types: Cigars, Cigarettes   Smokeless tobacco: Never   Tobacco comments:    2 BLACK AND MILD A DAY  Vaping Use   Vaping status: Every Day  Substance and Sexual Activity   Alcohol use: Yes    Comment: social/occassional   Drug use: Not Currently    Types: Marijuana   Sexual activity: Not on file  Other Topics Concern   Not on file  Social  History Narrative   Not on file   Social Drivers of Health   Financial Resource Strain: Not on file  Food Insecurity: Food Insecurity Present (12/26/2023)   Hunger Vital Sign    Worried About Running Out of Food in the Last Year: Sometimes true    Ran Out of Food in the Last Year: Sometimes true  Transportation Needs: No Transportation Needs (12/26/2023)   PRAPARE - Administrator, Civil Service (Medical): No    Lack of Transportation (Non-Medical): No  Physical Activity: Not on file  Stress: Not on file  Social Connections: Socially Isolated (12/26/2023)   Social Connection and Isolation Panel [NHANES]    Frequency of Communication with Friends and Family: Never    Frequency of Social Gatherings with Friends and Family: Never    Attends Religious Services: Never    Database administrator or Organizations: No    Attends Engineer, structural: Never    Marital Status: Never married   Additional Social History:                         Sleep: Good  Appetite:  Good  Current Medications: Current Facility-Administered Medications  Medication Dose Route Frequency Provider Last Rate Last Admin   acetaminophen (TYLENOL) tablet 650 mg  650 mg Oral Q6H PRN Jacob Dys, Jacob [name]       alum & mag hydroxide-simeth (MAALOX/MYLANTA) 200-200-20 MG/5ML suspension 30 mL  30 mL Oral Q4H PRN Jacob Dys, Jacob [name]       ARIPiprazole (ABILIFY) tablet 5 mg  5 mg Oral Daily Jacob [name], Jacob Matte, Jacob [name]   5 mg at 12/28/23 1017   haloperidol (HALDOL) tablet 5 mg  5 mg Oral TID PRN Jacob Dys, Jacob [name]       And   diphenhydrAMINE (BENADRYL) capsule 50 mg  50 mg Oral TID PRN Jacob Dys, Jacob [name]       haloperidol lactate (HALDOL) injection 5 mg  5 mg Intramuscular TID PRN Jacob Dys, Jacob [name]   5 mg at 12/27/23 2125   And   diphenhydrAMINE (BENADRYL) injection 50 mg  50 mg Intramuscular TID PRN Jacob Dys, Jacob [name]   50 mg at 12/27/23 2126   And   LORazepam (ATIVAN) injection 2 mg  2 mg Intramuscular TID PRN Jacob Dys, Jacob [name]   2 mg at 12/27/23 2126   haloperidol lactate (HALDOL) injection 10 mg  10 mg Intramuscular TID PRN Jacob Dys, Jacob [name]       And   diphenhydrAMINE (BENADRYL) injection 50 mg  50 mg Intramuscular TID PRN Jacob Dys, Jacob [name]       And   LORazepam (ATIVAN) injection 2 mg  2 mg Intramuscular TID PRN Jacob Dys, Jacob [name]       hydrOXYzine (ATARAX) tablet 10 mg  10 mg Oral TID PRN Jacob Dys,  Jacob [name]   10 mg at 12/27/23 0013   insulin aspart (novoLOG) injection 0-5 Units  0-5 Units Subcutaneous QHS Jacob [name], Jacob [name], Jacob [name]   4 Units at 12/27/23 2125   insulin aspart (novoLOG) injection 0-9 Units  0-9 Units Subcutaneous TID WC Jacob [name], Jacob [name], Jacob [name]   9 Units at 12/28/23 0818   insulin glargine-yfgn (SEMGLEE) injection 24 Units  24 Units Subcutaneous Q2200 Jacob [name], Jacob [name], Jacob [name]   24 Units at 12/27/23 2217   magnesium hydroxide (MILK OF MAGNESIA) suspension 30 mL  30 mL Oral Daily PRN Jacob Dys, Jacob [name]       nicotine (NICODERM CQ - dosed in mg/24 hours) patch 14 mg  14 mg Transdermal Q0600 Jacob Dys, Jacob [name]       traZODone (DESYREL) tablet 50 mg  50 mg Oral QHS PRN Jacob Dys, Jacob [name]   50 mg at 12/27/23 0013    Lab Results:  Results for orders placed or performed during the hospital encounter of 12/26/23 (from the past 48 hours)  Glucose, capillary     Status: Abnormal   Collection Time: 12/26/23  8:36 PM  Result Value Ref Range   Glucose-Capillary 594 (HH) 70 - 99 mg/dL    Comment: Glucose reference range applies only to samples taken after fasting for at least 8 hours.   Comment 1 Notify RN   Glucose, capillary     Status: Abnormal   Collection Time: 12/26/23 11:42 PM  Result Value Ref Range   Glucose-Capillary 332 (H) 70 -  99 mg/dL    Comment: Glucose reference range applies only to samples taken after fasting for at least 8 hours.  Glucose, capillary     Status: Abnormal   Collection Time: 12/27/23  5:32 AM  Result Value Ref Range   Glucose-Capillary 164 (H) 70 - 99 mg/dL    Comment: Glucose reference range applies only to samples taken after fasting for at least 8 hours.  Lipid panel     Status: Abnormal   Collection Time: 12/27/23  6:32 AM  Result Value Ref Range   Cholesterol 200 0 - 200 mg/dL   Triglycerides 161 (H) <150 mg/dL   HDL 84 >09 mg/dL   Total CHOL/HDL Ratio 2.4 RATIO   VLDL 39 0 - 40 mg/dL   LDL Cholesterol 77 0 - 99 mg/dL    Comment:        Total Cholesterol/HDL:CHD Risk Coronary Heart Disease Risk Table                     Men   Women  1/2 Average Risk   3.4   3.3  Average Risk       5.0   4.4  2 X Average Risk   9.6   7.1  3 X Average Risk  23.4   11.0        Use the calculated Patient Ratio above and the CHD Risk Table to determine the patient's CHD Risk.        ATP III CLASSIFICATION (LDL):  <100     mg/dL   Optimal  604-540  mg/dL   Near or Above                    Optimal  130-159  mg/dL   Borderline  981-191  mg/dL   High  >478     mg/dL   Very High Performed at [facility], [address]  [place], [place] [address]   Hepatic function panel     Status: Abnormal   Collection Time: 12/27/23  6:32 AM  Result Value Ref Range   Total Protein 6.3 (L) 6.5 - 8.1 g/dL   Albumin 3.5 3.5 - 5.0 g/dL   AST 67 (H) 15 - 41 U/L   ALT 52 (H) 0 - 44 U/L   Alkaline Phosphatase 84 38 - 126 U/L   Total Bilirubin 0.5 0.0 - 1.2 mg/dL   Bilirubin, Direct <7.2 0.0 - 0.2 mg/dL   Indirect Bilirubin NOT CALCULATED 0.3 - 0.9 mg/dL    Comment: Performed at [facility], [address], [place], [place] [address]  TSH     Status: None   Collection Time: 12/27/23  6:32 AM  Result Value Ref Range   TSH 3.661 0.350 - 4.500 uIU/mL    Comment: Performed  by a 3rd Generation assay with a functional sensitivity of <=0.01 uIU/mL. Performed at [facility], [address] [place], [place] [address]   Prolactin     Status: None   Collection Time: 12/27/23  6:32 AM  Result Value Ref Range   Prolactin 18.7 3.9 - 22.7 ng/mL    Comment: (NOTE) Performed At: [facility] [address] [place], [place] [id number] Jolene Schimke Jacob [name] FI:[id number]   Glucose, capillary     Status: Abnormal   Collection Time: 12/27/23  1:18 PM  Result Value Ref Range   Glucose-Capillary >600 (HH) 70 - 99 mg/dL    Comment: Glucose reference range applies only to samples taken after fasting for at least 8 hours.  Glucose, capillary     Status: Abnormal   Collection Time: 12/27/23  1:47 PM  Result Value Ref Range   Glucose-Capillary >600 (HH) 70 - 99 mg/dL    Comment: Glucose reference range applies only to samples taken after fasting for at least 8 hours.  CBG monitoring     Status: Abnormal   Collection Time: 12/27/23  2:47 PM  Result Value Ref Range   Glucose-Capillary >600 (HH) 70 - 99 mg/dL    Comment: Glucose reference range applies only to samples taken after fasting for at least 8 hours.  Blood gas, venous (at [facility] and [facility])     Status: Abnormal   Collection Time: 12/27/23  3:50 PM  Result Value Ref Range   pH, Ven 7.44 (H) 7.25 - 7.43   pCO2, Ven 46 44 - 60 mmHg   pO2, Ven 122 (H) 32 - 45 mmHg   Bicarbonate 31.2 (H) 20.0 - 28.0 mmol/L   Acid-Base Excess 6.1 (H) 0.0 - 2.0 mmol/L   O2 Saturation 99.2 %   Patient temperature 37.0     Comment: Performed at [facility], [address] [place], [place] [address]  Beta-hydroxybutyric acid     Status: None   Collection Time: 12/27/23  3:50 PM  Result Value Ref Range   Beta-Hydroxybutyric Acid 0.17 0.05 - 0.27 mmol/L    Comment: Performed at [facility], [address] [place], [place] [address]  POC CBG, ED     Status: Abnormal   Collection  Time: 12/27/23  4:59 PM  Result Value Ref Range   Glucose-Capillary 310 (H) 70 - 99 mg/dL    Comment: Glucose reference range applies only to samples taken after fasting for at least 8 hours.  Urinalysis, Routine w reflex microscopic -Urine, Clean Catch     Status: Abnormal   Collection  Time: 12/27/23  5:00 PM  Result Value Ref Range   Color, Urine COLORLESS (A) YELLOW   APPearance CLEAR CLEAR   Specific Gravity, Urine 1.026 1.005 - 1.030   pH 7.0 5.0 - 8.0   Glucose, UA >=500 (A) NEGATIVE mg/dL   Hgb urine dipstick NEGATIVE NEGATIVE   Bilirubin Urine NEGATIVE NEGATIVE   Ketones, ur NEGATIVE NEGATIVE mg/dL   Protein, ur NEGATIVE NEGATIVE mg/dL   Nitrite NEGATIVE NEGATIVE   Leukocytes,Ua NEGATIVE NEGATIVE   RBC / HPF 0-5 0 - 5 RBC/hpf   WBC, UA 0-5 0 - 5 WBC/hpf   Bacteria, UA NONE SEEN NONE SEEN   Squamous Epithelial / HPF 0-5 0 - 5 /HPF    Comment: Performed at [facility], [address], [place], [place] [address]  Glucose, capillary     Status: Abnormal   Collection Time: 12/27/23  7:15 PM  Result Value Ref Range   Glucose-Capillary 333 (H) 70 - 99 mg/dL    Comment: Glucose reference range applies only to samples taken after fasting for at least 8 hours.  Glucose, capillary     Status: Abnormal   Collection Time: 12/27/23  8:47 PM  Result Value Ref Range   Glucose-Capillary 367 (H) 70 - 99 mg/dL    Comment: Glucose reference range applies only to samples taken after fasting for at least 8 hours.   Comment 1 Notify RN   Glucose, capillary     Status: Abnormal   Collection Time: 12/28/23  5:54 AM  Result Value Ref Range   Glucose-Capillary 42 (LL) 70 - 99 mg/dL    Comment: Glucose reference range applies only to samples taken after fasting for at least 8 hours.   Comment 1 Notify RN   Glucose, capillary     Status: None   Collection Time: 12/28/23  6:24 AM  Result Value Ref Range   Glucose-Capillary 70 70 - 99 mg/dL    Comment: Glucose reference range  applies only to samples taken after fasting for at least 8 hours.  Glucose, capillary     Status: Abnormal   Collection Time: 12/28/23  8:10 AM  Result Value Ref Range   Glucose-Capillary 364 (H) 70 - 99 mg/dL    Comment: Glucose reference range applies only to samples taken after fasting for at least 8 hours.    Blood Alcohol level:  Lab Results  Component Value Date   ETH <10 12/23/2023   ETH <10 12/12/2023    Metabolic Disorder Labs: Lab Results  Component Value Date   HGBA1C 11.4 (H) 10/23/2023   MPG 280.48 10/23/2023   MPG >398 07/28/2020   Lab Results  Component Value Date   PROLACTIN 18.7 12/27/2023   Lab Results  Component Value Date   CHOL 200 12/27/2023   TRIG 194 (H) 12/27/2023   HDL 84 12/27/2023   CHOLHDL 2.4 12/27/2023   VLDL 39 12/27/2023   LDLCALC 77 12/27/2023   LDLCALC 125 (H) 03/03/2022    Physical Findings: AIMS:  , ,  ,  ,    CIWA:    COWS:     Musculoskeletal: Strength & Muscle Tone: within normal limits Gait & Station: normal Patient leans: N/A  Psychiatric Specialty Exam:  Presentation  General Appearance:  Disheveled  Eye Contact: Poor (Staring)  Speech: Garbled  Speech Volume: Normal  Handedness: Left   Mood and Affect  Mood: Irritable  Affect: Labile; Constricted   Thought Process  Thought Processes: Disorganized  Descriptions of  Associations:Tangential  Orientation:Partial  Thought Content:Scattered; Delusions; Paranoid Ideation  History of Schizophrenia/Schizoaffective disorder:Yes  Duration of Psychotic Symptoms:Greater than six months  Hallucinations:Hallucinations: None  Ideas of Reference:None  Suicidal Thoughts:Suicidal Thoughts: No  Homicidal Thoughts:Homicidal Thoughts: No   Sensorium  Memory: Immediate Poor; Recent Poor  Judgment: Impaired  Insight: Lacking   Executive Functions  Concentration: Poor  Attention Span: Poor  Recall: Poor  Fund of  Knowledge: Poor  Language: Fair   Psychomotor Activity  Psychomotor Activity: Psychomotor Activity: Normal   Assets  Assets: Communication Skills; Resilience   Sleep  Sleep: Sleep: Good Number of Hours of Sleep: 7    Physical Exam: Physical Exam Vitals and nursing note reviewed.  Constitutional:      General: He is not in acute distress.    Appearance: Normal appearance. He is normal weight. He is not ill-appearing or toxic-appearing.  HENT:     Head: Normocephalic and atraumatic.  Pulmonary:     Effort: Pulmonary effort is normal.  Musculoskeletal:        General: Normal range of motion.  Neurological:     General: No focal deficit present.     Mental Status: He is alert.    Review of Systems  Respiratory:  Negative for cough and shortness of breath.   Cardiovascular:  Negative for chest pain.  Gastrointestinal:  Negative for abdominal pain, constipation, diarrhea, nausea and vomiting.  Neurological:  Negative for dizziness, weakness and headaches.  Psychiatric/Behavioral:  Negative for depression, hallucinations and suicidal ideas. The patient is not nervous/anxious.    Blood pressure (!) 120/94, pulse 67, temperature 98.1 F (36.7 C), temperature source Oral, resp. rate 16, height 5\' 9"  (1.753 m), weight 58.5 kg, SpO2 100%. Body mass index is 19.05 kg/m.   Treatment Plan Summary: Daily contact with patient to assess and evaluate symptoms and progress in treatment and Medication management  Jacob [name] is a 33 yr old male who presented to [facility] on 2/21 in DKA, he was admitted and treated and then admitted to [facility] on 2/25.  PPHx is significant for Schizoaffective Disorder, Bipolar Type and Medication Non-Compliance, 1 Suicide Attempt (2019), and Multiple Psychiatric Hospitalizations [facility], old [place], possibly [employer], [employer] and [employer] [place] "their salad bar slaps").    Vidal was treated at [facility] and returned to the  unit yesterday.  When staff attempted to discuss snack choices and how it affects his diabetes he got agitated and required agitation medications.  Due to this he will not be allowed to go to the cafeteria and have a single tray brought to him, and he will not be given snacks.  He continues to be disorganized/delusional.  We will also start Abilify to minimize metabolic effects and it does have an LAI option given his history of non-compliance.  We will not make any other changes to his medications at this time.  We will draw a BMP daily to monitor electrolytes to ensure no derangements happen.  We will continue to monitor.    Schizoaffective Disorder, Bipolar Type: -Start Abilify 5 mg daily for psychosis and mood stability -Continue Agitation Protocol: Haldol/Ativan/Benadryl   Diabetes Type 1: -Continue SSI -Continue Semglee 24 units QHS -Restrict to one tray per meal and no snacks   Nicotine Dependence: -Continue Nicotine Patch 14 mg daily   -Continue PRN's: Tylenol, Maalox, Atarax, Milk of Magnesia, Trazodone   --  The risks/benefits/side-effects/alternatives to this medication were discussed in detail with the patient and time was given  for questions. The patient consents to medication trial.                -- Metabolic profile and EKG monitoring obtained while on an atypical antipsychotic (BMI: Lipid Panel: HbgA1c: QTc:)              -- Encouraged patient to participate in unit milieu and in scheduled group therapies          Safety and Monitoring:             -- Involuntary admission to inpatient psychiatric unit for safety, stabilization and treatment             -- Daily contact with patient to assess and evaluate symptoms and progress in treatment             -- Patient's case to be discussed in multi-disciplinary team meeting             -- Observation Level : q15 minute checks             -- Vital signs:  q12 hours             -- Precautions: suicide, elopement, and  assault  Discharge Planning:              -- Social work and case management to assist with discharge planning and identification of hospital follow-up needs prior to discharge             -- Estimated LOS: 5-7 more days             -- Discharge Concerns: Need to establish a safety plan; Medication compliance and effectiveness             -- Discharge Goals: Return home with outpatient referrals for mental health follow-up including medication management/psychotherapy   Jacob Franklin, Jacob [name] 12/28/2023, 10:54 AM

## 2023-12-28 NOTE — [facility] Group Notes (Signed)
 Spirituality group facilitated by Kathleen Argue, B[facility].  Group Description: Group focused on topic of hope. Patients participated in facilitated discussion around topic, connecting with one another around experiences and definitions for hope. Group members engaged with visual explorer photos, reflecting on what hope looks like for them today. Group engaged in discussion around how their definitions of hope are present today in hospital.  Modalities: Psycho-social ed, Adlerian, Narrative, MI  Patient Progress: Did not attend.

## 2023-12-28 NOTE — Progress Notes (Signed)
 Pt was disruptive during group, pt was redirected several times. Pt was invading other patient's personal space. Pt was talking inappropriate. Pt was trying to intimidate other pt's. Pt was stating he has murder charges in other states, He was not afraid to have one in [place]. Pt was sent out of the dayroom.

## 2023-12-28 NOTE — Inpatient Diabetes Management (Addendum)
 Inpatient Diabetes Program Recommendations  AACE/ADA: New Consensus Statement on Inpatient Glycemic Control   Target Ranges:  Prepandial:   less than 140 mg/dL      Peak postprandial:   less than 180 mg/dL (1-2 hours)      Critically ill patients:  140 - 180 mg/dL    Latest Reference Range & Units 12/28/23 12:36  Glucose-Capillary 70 - 99 mg/dL 191 (H)    Latest Reference Range & Units 12/28/23 05:54 12/28/23 06:24 12/28/23 08:10  Glucose-Capillary 70 - 99 mg/dL 42 (LL) 70 478 (H)    Latest Reference Range & Units 12/27/23 05:32 12/27/23 13:18 12/27/23 13:47 12/27/23 14:47 12/27/23 16:59 12/27/23 19:15 12/27/23 20:47  Glucose-Capillary 70 - 99 mg/dL 295 (H) >621 (HH) >308 (HH) >600 (HH) 310 (H) 333 (H) 367 (H)   Review of Glycemic Control  Diabetes history: DM1 Outpatient Diabetes medications: Lantus 24 units at bedtime, Humalog 7 units TID with meals; Dexcom G7 CGM Current orders for Inpatient glycemic control: Lantus 24 units daily, Novolog 0-9 units TID with meals, Novolog 0-5 units at bedtime    Inpatient Diabetes Program Recommendations:    Insulin: No Semglee given on 12/26/23 and as a result glucose up to >600 mg/dl on 6/57/84. Patient received Semglee 24 units last night and CBG 42 mg/dl today. Please consider decreasing Semglee to 21 units at bedtime and adding back Novolog 5 units TID with meals for meal coverage (as patient has Type 1 DM and needs insulin to cover carbs consumed).  Addendum 12/28/23@14 :57-Noted glucose up to 399 mg/dl at 69:62. Sent chat message to Dr. Jackquline Berlin at 10:44 am today with above recommendations. Sent another chat message to Dr. Jackquline Berlin at 14:55 today requesting that meal coverage be ordered.   Thanks, Orlando Penner, RN, MSN, CDCES Diabetes Coordinator Inpatient Diabetes Program [phone] (Team Pager from 8am to 5pm)

## 2023-12-29 DIAGNOSIS — F259 Schizoaffective disorder, unspecified: Secondary | ICD-10-CM | POA: Diagnosis not present

## 2023-12-29 LAB — BASIC METABOLIC PANEL
Anion gap: 9 (ref 5–15)
BUN: 15 mg/dL (ref 6–20)
CO2: 26 mmol/L (ref 22–32)
Calcium: 8.5 mg/dL — ABNORMAL LOW (ref 8.9–10.3)
Chloride: 98 mmol/L (ref 98–111)
Creatinine, Ser: 0.55 mg/dL — ABNORMAL LOW (ref 0.61–1.24)
GFR, Estimated: >60 mL/min (ref >60)
Glucose, Bld: 77 mg/dL (ref 70–99)
Potassium: 3.9 mmol/L (ref 3.5–5.1)
Sodium: 133 mmol/L — ABNORMAL LOW (ref 135–145)

## 2023-12-29 LAB — GLUCOSE, CAPILLARY
Glucose-Capillary: 89 mg/dL (ref 70–99)
Glucose-Capillary: 163 mg/dL — ABNORMAL HIGH (ref 70–99)
Glucose-Capillary: 325 mg/dL — ABNORMAL HIGH (ref 70–99)
Glucose-Capillary: 254 mg/dL — ABNORMAL HIGH (ref 70–99)

## 2023-12-29 NOTE — [facility] Group Notes (Signed)
 Patient did not attend the [facility] group.

## 2023-12-29 NOTE — Group Note (Signed)
 Recreation Therapy Group Note   Group Topic:Self-Esteem  Group Date: 12/29/2023 Start Time: 0945 End Time: 1035 Facilitators: Idalys Konecny-McCall, LRT,CTRS Location: [address]   Group Topic: Self-Esteem  Goal Area(s) Addresses:  Patient will successfully identify positive attributes about themselves.  Patient will identify healthy ways to increase self-esteem. Patient will acknowledge benefit(s) of improved self-esteem.   Intervention: Sheet with blank mirror, markers  Activity: Patients were asked to define self esteem. LRT and patients then discussed the differences between negative and positive self esteem. LRT then gave patients a picture of a blank mirror. Patients were to identify the positive qualities about themselves as they saw it in the mirror. Patients could express these qualities through words or drawings.  Education:  Self-Esteem, Discharge Planning  Education Outcome: Acknowledges education/In group clarification offered/Needs additional education   Affect/Mood: N/A   Participation Level: Did not attend    Clinical Observations/Individualized Feedback:      Plan: Continue to engage patient in RT group sessions 2-3x/week.   Dee Maday-McCall, LRT,CTRS 12/29/2023 11:54 AM

## 2023-12-29 NOTE — Inpatient Diabetes Management (Signed)
 Inpatient Diabetes Program Recommendations  AACE/ADA: New Consensus Statement on Inpatient Glycemic Control   Target Ranges:  Prepandial:   less than 140 mg/dL      Peak postprandial:   less than 180 mg/dL (1-2 hours)      Critically ill patients:  140 - 180 mg/dL    Latest Reference Range & Units 12/29/23 06:48  Glucose 70 - 99 mg/dL 77    Latest Reference Range & Units 12/27/23 19:15 12/27/23 20:47 12/28/23 05:54 12/28/23 06:24 12/28/23 08:10 12/28/23 12:01 12/28/23 12:36 12/28/23 15:16 12/28/23 17:24 12/28/23 19:59 12/29/23 05:40  Glucose-Capillary 70 - 99 mg/dL 161 (H)  Novolog 18 units @19 :18 367 (H)  Novolog 4 units @21 :25  Semglee 24 units @22 :17 42 (LL) 70 364 (H)  Novolog 9 units @8 :18 429 (H) 399 (H)  Novolog 9 units @12 :09 167 (H) 195 (H)  Novolog 2 units 208 (H)  Novolog 2 units  Semglee 24 units 89   Review of Glycemic Control  Diabetes history: DM1 Outpatient Diabetes medications: Lantus 24 units at bedtime, Humalog 7 units TID with meals; Dexcom G7 CGM Current orders for Inpatient glycemic control: Semglee 24 units daily, Novolog 0-9 units TID with meals, Novolog 0-5 units at bedtime     Inpatient Diabetes Program Recommendations:     Insulin: Lab glucose 77 mg/dl at 0:96 am today. Please consider decreasing Semglee to 22 units at bedtime and ordering Novolog 4 units TID with meals for meal coverage (as patient has Type 1 DM and needs insulin to cover carbs consumed).   NOTE: Per Avie Echevaria, RN note on 12/28/23, MD put patient on unit restriction due to eating excessively.  Patient has Type 1 DM and does NOT make any insulin.  Therefore, he will need basal, correction, and carb coverage insulin.   Thanks, Orlando Penner, RN, MSN, CDCES Diabetes Coordinator Inpatient Diabetes Program [phone] (Team Pager from 8am to 5pm)

## 2023-12-29 NOTE — Progress Notes (Signed)
 [facility] MD Progress Note  12/29/2023 11:16 AM Jacob [name]  MRN:  [id number] Subjective:   Jacob [name] is a 33 yr old male who presented to [facility] on 2/21 in DKA, he was admitted and treated and then admitted to [name] on 2/25.  PPHx is significant for Schizoaffective Disorder, Bipolar Type and Medication Non-Compliance, 1 Suicide Attempt (2019), and Multiple Psychiatric Hospitalizations [facility], old [place], possibly [employer], [employer] and [facility] "their salad bar slaps").    Case was discussed in the multidisciplinary team. MAR was reviewed and patient was compliant with medications.  He received PRN Trazodone and Hydroxyzine last night.   Psychiatric Team made the following recommendations yesterday: -Start Abilify 5 mg daily for psychosis and mood stability     On interview today patient reports he slept good last night.  He reports his appetite is doing good.  He reports no SI, HI, or AVH.  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  He reports no issues with his medications.  Asked why he was moved to the [address] and he initially stated he did not remember.  When asked if it had to do with trying to take ice cram out of the fridge he said he needed ice cream to calm himself down.  Attempted to discuss that his blood sugar can become significantly effected and he reports he doesn't care he needs ice cream.  He reports no other concerns at present.   Principal Problem: Schizoaffective disorder (HCC) Diagnosis: Principal Problem:   Schizoaffective disorder (HCC) Active Problems:   Uncontrolled type 1 diabetes mellitus with hyperglycemia, with long-term current use of insulin (HCC)  Total Time spent with patient:  I personally spent 35 minutes on the unit in direct patient care. The direct patient care time included face-to-face time with the patient, reviewing the  patient's chart, communicating with other professionals, and coordinating care. Greater than 50% of this time was spent in counseling or coordinating care with the patient regarding goals of hospitalization, psycho-education, and discharge planning needs.   Past Psychiatric History:  Schizoaffective Disorder, Bipolar Type and Medication Non-Compliance, 1 Suicide Attempt (2019), and Multiple Psychiatric Hospitalizations [facility], old [place], possibly [employer], [employer] and [facility] "their salad bar slaps").  Past Medical History:  Past Medical History:  Diagnosis Date   Bipolar 1 disorder (HCC)    History of attempted suicide 2019   Unsuccessful suicide attempt in 2019 with rat poison   Schizoaffective disorder (HCC)    Type 1 diabetes mellitus on insulin therapy (HCC) 2019   History reviewed. No pertinent surgical history. Family History:  Family History  Problem Relation Age of Onset   Healthy Mother    Healthy Father    Family Psychiatric  History:  Mother- Crack Cocaine Abuse "Don't Really Know"  Social History:  Social History   Substance and Sexual Activity  Alcohol Use Yes   Comment: social/occassional     Social History   Substance and Sexual Activity  Drug Use Not Currently   Types: Marijuana    Social History   Socioeconomic History   Marital status: Single    Spouse name: Not on file   Number of children: Not on file   Years of education: Not on file   Highest education level: Not on file  Occupational History   Not on file  Tobacco Use  Smoking status: Former    Types: Cigars, Cigarettes   Smokeless tobacco: Never   Tobacco comments:    2 BLACK AND MILD A DAY  Vaping Use   Vaping status: Every Day  Substance and Sexual Activity   Alcohol use: Yes    Comment: social/occassional   Drug use: Not Currently    Types: Marijuana   Sexual activity: Not on file  Other Topics Concern   Not on file  Social History  Narrative   Not on file   Social Drivers of Health   Financial Resource Strain: Not on file  Food Insecurity: Food Insecurity Present (12/26/2023)   Hunger Vital Sign    Worried About Running Out of Food in the Last Year: Sometimes true    Ran Out of Food in the Last Year: Sometimes true  Transportation Needs: No Transportation Needs (12/26/2023)   PRAPARE - Administrator, Civil Service (Medical): No    Lack of Transportation (Non-Medical): No  Physical Activity: Not on file  Stress: Not on file  Social Connections: Socially Isolated (12/26/2023)   Social Connection and Isolation Panel [NHANES]    Frequency of Communication with Friends and Family: Never    Frequency of Social Gatherings with Friends and Family: Never    Attends Religious Services: Never    Diplomatic Services operational officer: No    Attends Engineer, structural: Never    Marital Status: Never married   Additional Social History:                         Sleep: Good  Appetite:  Good  Current Medications: Current Facility-Administered Medications  Medication Dose Route Frequency Provider Last Rate Last Admin   acetaminophen (TYLENOL) tablet 650 mg  650 mg Oral Q6H PRN Margaretmary Dys, MD       alum & mag hydroxide-simeth (MAALOX/MYLANTA) 200-200-20 MG/5ML suspension 30 mL  30 mL Oral Q4H PRN Margaretmary Dys, MD       ARIPiprazole (ABILIFY) tablet 5 mg  5 mg Oral Daily Lauro Franklin, MD   5 mg at 12/29/23 9147   haloperidol (HALDOL) tablet 5 mg  5 mg Oral TID PRN Margaretmary Dys, MD       And   diphenhydrAMINE (BENADRYL) capsule 50 mg  50 mg Oral TID PRN Margaretmary Dys, MD       haloperidol lactate (HALDOL) injection 5 mg  5 mg Intramuscular TID PRN Margaretmary Dys, MD   5 mg at 12/27/23 2125   And   diphenhydrAMINE (BENADRYL) injection 50 mg  50 mg Intramuscular TID PRN Margaretmary Dys, MD   50 mg at 12/27/23 2126   And   LORazepam (ATIVAN) injection 2 mg  2 mg Intramuscular TID PRN Margaretmary Dys, MD   2 mg at 12/27/23 2126   haloperidol lactate (HALDOL) injection 10 mg  10 mg Intramuscular TID PRN Margaretmary Dys, MD       And   diphenhydrAMINE (BENADRYL) injection 50 mg  50 mg Intramuscular TID PRN Margaretmary Dys, MD       And   LORazepam (ATIVAN) injection 2 mg  2 mg Intramuscular TID PRN Margaretmary Dys, MD       hydrOXYzine (ATARAX) tablet 10 mg  10 mg Oral TID PRN Margaretmary Dys, MD   10 mg at  12/28/23 2045   insulin aspart (novoLOG) injection 0-5 Units  0-5 Units Subcutaneous QHS Onuoha, Chinwendu V, NP   2 Units at 12/28/23 2040   insulin aspart (novoLOG) injection 0-9 Units  0-9 Units Subcutaneous TID WC Onuoha, Chinwendu V, NP   2 Units at 12/28/23 1748   insulin glargine-yfgn (SEMGLEE) injection 24 Units  24 Units Subcutaneous Q2200 Onuoha, Chinwendu V, NP   24 Units at 12/28/23 2041   magnesium hydroxide (MILK OF MAGNESIA) suspension 30 mL  30 mL Oral Daily PRN Margaretmary Dys, MD       nicotine (NICODERM CQ - dosed in mg/24 hours) patch 14 mg  14 mg Transdermal Q0600 Margaretmary Dys, MD       traZODone (DESYREL) tablet 50 mg  50 mg Oral QHS PRN Margaretmary Dys, MD   50 mg at 12/28/23 2045    Lab Results:  Results for orders placed or performed during the hospital encounter of 12/26/23 (from the past 48 hours)  Glucose, capillary     Status: Abnormal   Collection Time: 12/27/23  1:18 PM  Result Value Ref Range   Glucose-Capillary >600 (HH) 70 - 99 mg/dL    Comment: Glucose reference range applies only to samples taken after fasting for at least 8 hours.  Glucose, capillary     Status: Abnormal   Collection Time: 12/27/23  1:47 PM  Result Value Ref Range   Glucose-Capillary >600 (HH) 70 - 99 mg/dL    Comment: Glucose  reference range applies only to samples taken after fasting for at least 8 hours.  CBG monitoring     Status: Abnormal   Collection Time: 12/27/23  2:47 PM  Result Value Ref Range   Glucose-Capillary >600 (HH) 70 - 99 mg/dL    Comment: Glucose reference range applies only to samples taken after fasting for at least 8 hours.  Blood gas, venous (at [facility] and [facility])     Status: Abnormal   Collection Time: 12/27/23  3:50 PM  Result Value Ref Range   pH, Ven 7.44 (H) 7.25 - 7.43   pCO2, Ven 46 44 - 60 mmHg   pO2, Ven 122 (H) 32 - 45 mmHg   Bicarbonate 31.2 (H) 20.0 - 28.0 mmol/L   Acid-Base Excess 6.1 (H) 0.0 - 2.0 mmol/L   O2 Saturation 99.2 %   Patient temperature 37.0     Comment: Performed at [facility], [address] [place], [place] [address]  Beta-hydroxybutyric acid     Status: None   Collection Time: 12/27/23  3:50 PM  Result Value Ref Range   Beta-Hydroxybutyric Acid 0.17 0.05 - 0.27 mmol/L    Comment: Performed at [facility], [address], [place], [place] [address]  POC CBG, ED     Status: Abnormal   Collection Time: 12/27/23  4:59 PM  Result Value Ref Range   Glucose-Capillary 310 (H) 70 - 99 mg/dL    Comment: Glucose reference range applies only to samples taken after fasting for at least 8 hours.  Urinalysis, Routine w reflex microscopic -Urine, Clean Catch     Status: Abnormal   Collection Time: 12/27/23  5:00 PM  Result Value Ref Range   Color, Urine COLORLESS (A) YELLOW   APPearance CLEAR CLEAR   Specific Gravity, Urine 1.026 1.005 - 1.030   pH 7.0 5.0 - 8.0   Glucose, UA >=500 (A) NEGATIVE mg/dL   Hgb urine dipstick NEGATIVE NEGATIVE  Bilirubin Urine NEGATIVE NEGATIVE   Ketones, ur NEGATIVE NEGATIVE mg/dL   Protein, ur NEGATIVE NEGATIVE mg/dL   Nitrite NEGATIVE NEGATIVE   Leukocytes,Ua NEGATIVE NEGATIVE   RBC / HPF 0-5 0 - 5 RBC/hpf   WBC, UA 0-5 0 - 5 WBC/hpf   Bacteria, UA NONE SEEN NONE SEEN   Squamous Epithelial /  HPF 0-5 0 - 5 /HPF    Comment: Performed at [facility], [address] [place], [place] [address]  Glucose, capillary     Status: Abnormal   Collection Time: 12/27/23  7:15 PM  Result Value Ref Range   Glucose-Capillary 333 (H) 70 - 99 mg/dL    Comment: Glucose reference range applies only to samples taken after fasting for at least 8 hours.  Glucose, capillary     Status: Abnormal   Collection Time: 12/27/23  8:47 PM  Result Value Ref Range   Glucose-Capillary 367 (H) 70 - 99 mg/dL    Comment: Glucose reference range applies only to samples taken after fasting for at least 8 hours.   Comment 1 Notify RN   Glucose, capillary     Status: Abnormal   Collection Time: 12/28/23  5:54 AM  Result Value Ref Range   Glucose-Capillary 42 (LL) 70 - 99 mg/dL    Comment: Glucose reference range applies only to samples taken after fasting for at least 8 hours.   Comment 1 Notify RN   Glucose, capillary     Status: None   Collection Time: 12/28/23  6:24 AM  Result Value Ref Range   Glucose-Capillary 70 70 - 99 mg/dL    Comment: Glucose reference range applies only to samples taken after fasting for at least 8 hours.  Glucose, capillary     Status: Abnormal   Collection Time: 12/28/23  8:10 AM  Result Value Ref Range   Glucose-Capillary 364 (H) 70 - 99 mg/dL    Comment: Glucose reference range applies only to samples taken after fasting for at least 8 hours.  Glucose, capillary     Status: Abnormal   Collection Time: 12/28/23 12:01 PM  Result Value Ref Range   Glucose-Capillary 429 (H) 70 - 99 mg/dL    Comment: Glucose reference range applies only to samples taken after fasting for at least 8 hours.  Glucose, capillary     Status: Abnormal   Collection Time: 12/28/23 12:36 PM  Result Value Ref Range   Glucose-Capillary 399 (H) 70 - 99 mg/dL    Comment: Glucose reference range applies only to samples taken after fasting for at least 8 hours.  Glucose, capillary     Status:  Abnormal   Collection Time: 12/28/23  3:16 PM  Result Value Ref Range   Glucose-Capillary 167 (H) 70 - 99 mg/dL    Comment: Glucose reference range applies only to samples taken after fasting for at least 8 hours.  Glucose, capillary     Status: Abnormal   Collection Time: 12/28/23  5:24 PM  Result Value Ref Range   Glucose-Capillary 195 (H) 70 - 99 mg/dL    Comment: Glucose reference range applies only to samples taken after fasting for at least 8 hours.  Glucose, capillary     Status: Abnormal   Collection Time: 12/28/23  7:59 PM  Result Value Ref Range   Glucose-Capillary 208 (H) 70 - 99 mg/dL    Comment: Glucose reference range applies only to samples taken after fasting for at least 8 hours.   Comment 1  Notify RN   Glucose, capillary     Status: None   Collection Time: 12/29/23  5:40 AM  Result Value Ref Range   Glucose-Capillary 89 70 - 99 mg/dL    Comment: Glucose reference range applies only to samples taken after fasting for at least 8 hours.   Comment 1 Notify RN   Basic metabolic panel     Status: Abnormal   Collection Time: 12/29/23  6:48 AM  Result Value Ref Range   Sodium 133 (L) 135 - 145 mmol/L   Potassium 3.9 3.5 - 5.1 mmol/L   Chloride 98 98 - 111 mmol/L   CO2 26 22 - 32 mmol/L   Glucose, Bld 77 70 - 99 mg/dL    Comment: Glucose reference range applies only to samples taken after fasting for at least 8 hours.   BUN 15 6 - 20 mg/dL   Creatinine, Ser 9.60 (L) 0.61 - 1.24 mg/dL   Calcium 8.5 (L) 8.9 - 10.3 mg/dL   GFR, Estimated >45 >40 mL/min    Comment: (NOTE) Calculated using the CKD-EPI Creatinine Equation (2021)    Anion gap 9 5 - 15    Comment: Performed at [facility], [address] [place], [place] [address]    Blood Alcohol level:  Lab Results  Component Value Date   [facility] <10 12/23/2023   ETH <10 12/12/2023    Metabolic Disorder Labs: Lab Results  Component Value Date   HGBA1C 11.4 (H) 10/23/2023   MPG 280.48 10/23/2023    MPG >398 07/28/2020   Lab Results  Component Value Date   PROLACTIN 18.7 12/27/2023   Lab Results  Component Value Date   CHOL 200 12/27/2023   TRIG 194 (H) 12/27/2023   HDL 84 12/27/2023   CHOLHDL 2.4 12/27/2023   VLDL 39 12/27/2023   LDLCALC 77 12/27/2023   LDLCALC 125 (H) 03/03/2022    Physical Findings: AIMS:  , ,  ,  ,    CIWA:    COWS:     Musculoskeletal: Strength & Muscle Tone: within normal limits Gait & Station: normal Patient leans: N/A  Psychiatric Specialty Exam:  Presentation  General Appearance:  Disheveled  Eye Contact: Poor  Speech: -- (mix of garbled)  Speech Volume: Normal  Handedness: Left   Mood and Affect  Mood: Irritable  Affect: Constricted   Thought Process  Thought Processes: Linear  Descriptions of Associations:Tangential  Orientation:Partial  Thought Content:Paranoid Ideation; Scattered  History of Schizophrenia/Schizoaffective disorder:Yes  Duration of Psychotic Symptoms:Greater than six months  Hallucinations:Hallucinations: None  Ideas of Reference:None  Suicidal Thoughts:Suicidal Thoughts: No  Homicidal Thoughts:Homicidal Thoughts: No   Sensorium  Memory: Immediate Poor  Judgment: Impaired  Insight: Lacking   Executive Functions  Concentration: Poor  Attention Span: Poor  Recall: Poor  Fund of Knowledge: Poor  Language: Fair   Psychomotor Activity  Psychomotor Activity: Psychomotor Activity: Normal   Assets  Assets: Communication Skills; Resilience   Sleep  Sleep: Sleep: Good Number of Hours of Sleep: 6.5    Physical Exam: Physical Exam Vitals and nursing note reviewed.  Constitutional:      General: He is not in acute distress.    Appearance: Normal appearance. He is normal weight. He is not ill-appearing or toxic-appearing.  HENT:     Head: Normocephalic and atraumatic.  Pulmonary:     Effort: Pulmonary effort is normal.  Neurological:     Mental  Status: He is alert.    Review of Systems  Respiratory:  Negative for cough and shortness of breath.   Cardiovascular:  Negative for chest pain.  Gastrointestinal:  Negative for abdominal pain, constipation, diarrhea, nausea and vomiting.  Neurological:  Negative for dizziness, weakness and headaches.  Psychiatric/Behavioral:  Negative for depression, hallucinations and suicidal ideas. The patient is not nervous/anxious.    Blood pressure (!) 134/98, pulse 91, temperature 98.4 F (36.9 C), temperature source Oral, resp. rate 18, height 5\' 9"  (1.753 m), weight 58.5 kg, SpO2 99%. Body mass index is 19.05 kg/m.   Treatment Plan Summary: Daily contact with patient to assess and evaluate symptoms and progress in treatment and Medication management  Jeral Walmsley is a 33 yr old male who presented to [facility] on 2/21 in DKA, he was admitted and treated and then admitted to [facility] on 2/25.  PPHx is significant for Schizoaffective Disorder, Bipolar Type and Medication Non-Compliance, 1 Suicide Attempt (2019), and Multiple Psychiatric Hospitalizations [facility], old [place], possibly [facility], [employer] and [facility] "their salad bar slaps").    Mattthew continues to be disorganized and tangential.  He has tolerated starting the Abilify yesterday.  With the restriction to snacks and a single food tray his blood sugars have been better controled.  We will not make any changes to his medications at this time.  We will continue to monitor.    Schizoaffective Disorder, Bipolar Type: -Continue Abilify 5 mg daily for psychosis and mood stability -Continue Agitation Protocol: Haldol/Ativan/Benadryl   Diabetes Type 1: -Continue SSI -Continue Semglee 24 units QHS -Restrict to one tray per meal and no snacks   Nicotine Dependence: -Continue Nicotine Patch 14 mg daily   -Continue PRN's: Tylenol, Maalox, Atarax, Milk of Magnesia, Trazodone   --  The  risks/benefits/side-effects/alternatives to this medication were discussed in detail with the patient and time was given for questions. The patient consents to medication trial.                -- Metabolic profile and EKG monitoring obtained while on an atypical antipsychotic (BMI: Lipid Panel: HbgA1c: QTc:)              -- Encouraged patient to participate in unit milieu and in scheduled group therapies          Safety and Monitoring:             -- Involuntary admission to inpatient psychiatric unit for safety, stabilization and treatment             -- Daily contact with patient to assess and evaluate symptoms and progress in treatment             -- Patient's case to be discussed in multi-disciplinary team meeting             -- Observation Level : q15 minute checks             -- Vital signs:  q12 hours             -- Precautions: suicide, elopement, and assault  Discharge Planning:              -- Social work and case management to assist with discharge planning and identification of hospital follow-up needs prior to discharge             -- Estimated LOS: 5-7 more days             -- Discharge Concerns: Need to establish a safety plan; Medication compliance and effectiveness             --  Discharge Goals: Return home with outpatient referrals for mental health follow-up including medication management/psychotherapy   Lauro Franklin, MD 12/29/2023, 11:16 AM

## 2023-12-29 NOTE — Progress Notes (Signed)
   12/29/23 1000  Psych Admission Type (Psych Patients Only)  Admission Status Voluntary  Psychosocial Assessment  Patient Complaints Irritability  Eye Contact Fair  Facial Expression Angry  Affect Irritable  Speech Logical/coherent  Interaction Childlike  Motor Activity Fidgety  Appearance/Hygiene Unremarkable  Behavior Characteristics Irritable  Mood Irritable  Thought Process  Coherency Circumstantial  Content Delusions  Delusions Referential  Perception WDL  Hallucination None reported or observed  Judgment Poor  Confusion None  Danger to Self  Current suicidal ideation? Denies  Danger to Others  Danger to Others None reported or observed   Dar Note: Patient presents with irritable mood and affect.  Refused to respond during assessment.  Patient is withdrawn and isolative to his room for majority of this shift.  Routine safety checks maintained.  Patient is safe on the unit.
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
## 2023-12-29 NOTE — Plan of Care (Signed)
   Problem: Education: Goal: Emotional status will improve Outcome: Not Progressing Goal: Mental status will improve Outcome: Not Progressing   Problem: Activity: Goal: Interest or engagement in activities will improve Outcome: Not Progressing

## 2023-12-29 NOTE — Progress Notes (Signed)
 Pt refused his bedtime short acting insulin and bedtime long time acting insulin. We attempted several times to get him take his insulin and provided med education.   Provider made aware.

## 2023-12-30 DIAGNOSIS — F259 Schizoaffective disorder, unspecified: Secondary | ICD-10-CM | POA: Diagnosis not present

## 2023-12-30 LAB — BASIC METABOLIC PANEL
Anion gap: 8 (ref 5–15)
BUN: 16 mg/dL (ref 6–20)
CO2: 25 mmol/L (ref 22–32)
Calcium: 8.7 mg/dL — ABNORMAL LOW (ref 8.9–10.3)
Chloride: 94 mmol/L — ABNORMAL LOW (ref 98–111)
Creatinine, Ser: 0.75 mg/dL (ref 0.61–1.24)
GFR, Estimated: >60 mL/min (ref >60)
Glucose, Bld: 368 mg/dL — ABNORMAL HIGH (ref 70–99)
Potassium: 4 mmol/L (ref 3.5–5.1)
Sodium: 127 mmol/L — ABNORMAL LOW (ref 135–145)

## 2023-12-30 LAB — GLUCOSE, CAPILLARY
Glucose-Capillary: 373 mg/dL — ABNORMAL HIGH (ref 70–99)
Glucose-Capillary: 423 mg/dL — ABNORMAL HIGH (ref 70–99)
Glucose-Capillary: 341 mg/dL — ABNORMAL HIGH (ref 70–99)
Glucose-Capillary: 317 mg/dL — ABNORMAL HIGH (ref 70–99)
Glucose-Capillary: 167 mg/dL — ABNORMAL HIGH (ref 70–99)
Glucose-Capillary: 146 mg/dL — ABNORMAL HIGH (ref 70–99)

## 2023-12-30 MED ORDER — INSULIN ASPART 100 UNIT/ML IJ SOLN
3.0000 [IU] | Freq: Once | INTRAMUSCULAR | Status: AC
  Administered 2023-12-30: 3 [IU] via SUBCUTANEOUS

## 2023-12-30 MED ORDER — INSULIN GLARGINE 100 UNIT/ML ~~LOC~~ SOLN
24.0000 [IU] | Freq: Every day | SUBCUTANEOUS | Status: DC
  Administered 2023-12-30: 24 [IU] via SUBCUTANEOUS
  Filled 2023-12-30 (×2): qty 0.24

## 2023-12-30 MED ORDER — INSULIN ASPART 100 UNIT/ML IJ SOLN
4.0000 [IU] | Freq: Three times a day (TID) | INTRAMUSCULAR | Status: DC
  Administered 2023-12-30 – 2024-01-03 (×11): 4 [IU] via SUBCUTANEOUS

## 2023-12-30 NOTE — [facility] Group Notes (Signed)
 Adult Psychoeducational Group Note  Date:  12/30/2023 Time:  8:32 PM  Group Topic/Focus:  Wrap-Up Group:   The focus of this group is to help patients review their daily goal of treatment and discuss progress on daily workbooks.  Participation Level:  Active  Participation Quality:  Appropriate  Affect:  Appropriate  Cognitive:  Appropriate  Insight: Appropriate  Engagement in Group:  Engaged  Modes of Intervention:  Discussion  Additional Comments:  Pt attended group.  Joselyn Arrow 12/30/2023, 8:32 PM

## 2023-12-30 NOTE — Progress Notes (Signed)
   12/30/23 1700  Psych Admission Type (Psych Patients Only)  Admission Status Voluntary  Psychosocial Assessment  Patient Complaints Anxiety;Irritability  Eye Contact Fair  Facial Expression Angry  Affect Irritable  Speech Argumentative  Interaction Demanding  Motor Activity Restless  Appearance/Hygiene Unremarkable  Behavior Characteristics Irritable  Mood Labile  Thought Process  Coherency Circumstantial  Content Delusions  Delusions Paranoid  Perception WDL  Hallucination None reported or observed  Judgment Poor  Confusion None  Danger to Self  Current suicidal ideation? Denies  Danger to Others  Danger to Others None reported or observed   Dar Note: Patient presents with irritable affect and mood.  Patient is fixated and constantly demanding for snacks even when reminded/educated about his high blood sugar levels.  Argumentative with staff when offered a healthy choice.  Patient observed pacing the hallway for majority of this shift.  Medications given as prescribed.  Support and encouragement offered as needed.  Routine safety checks maintained.  Patient is safe on the unit.

## 2023-12-30 NOTE — Progress Notes (Signed)
 Collateral contact - [facility] Fax:  [phone]  CSW attempted to fax a referral with supporting documents to [facility]  The fax didn't go through (CSW received an error message, most likely because she attempted to fax too many pages.  It's still possible that some pages went through, but not the whole document).  On Monday, she will call [facility] and ask for an email address so she can send the application via secure email.   Read Drivers, LCSWA 12/30/2023

## 2023-12-30 NOTE — Plan of Care (Signed)
   Problem: Education: Goal: Emotional status will improve Outcome: Not Progressing Goal: Mental status will improve Outcome: Not Progressing Goal: Verbalization of understanding the information provided will improve Outcome: Not Progressing

## 2023-12-30 NOTE — Progress Notes (Signed)
   12/29/23 2040  Psych Admission Type (Psych Patients Only)  Admission Status Voluntary  Psychosocial Assessment  Patient Complaints Anxiety;Irritability;Agitation  Eye Contact Fair  Facial Expression Angry  Affect Irritable  Radiation protection practitioner;Childlike  Motor Activity Fidgety  Appearance/Hygiene Unremarkable  Behavior Characteristics Anxious;Agitated;Irritable  Mood Anxious;Irritable;Angry  Thought Process  Coherency Circumstantial  Content Delusions  Delusions Paranoid  Perception WDL  Hallucination None reported or observed  Judgment Poor  Confusion None  Danger to Self  Current suicidal ideation? Denies  Danger to Others  Danger to Others None reported or observed

## 2023-12-30 NOTE — Progress Notes (Signed)
 [facility] MD Progress Note  12/30/2023 9:51 AM Jacob [name]  MRN:  [id number] Subjective:   Jacob [name] is a 33 yr old male who presented to [facility] on 2/21 in DKA, he was admitted and treated and then admitted to [facility] on 2/25.  PPHx is significant for Schizoaffective Disorder, Bipolar Type and Medication Non-Compliance, 1 Suicide Attempt (2019), and Multiple Psychiatric Hospitalizations [facility], old [place], possibly [employer], [employer] and [facility] "their salad bar slaps").   The patient's chart was reviewed and nursing notes were reviewed. Vitals signs: BP 136/94. The patient's case was discussed in multidisciplinary team meeting. Per MAR, patient refuses morning, noon, and bedtime SSI.  He also refused Semglee last night. The following as needed medications were given: None. Per nursing, patient is withdrawn and isolative and attended 1 group session.   Patient was seen showering this morning.  He reports sleeping fairly, reports getting up early today and not being able to fall back asleep.  He reports good appetite.  When I asked him why he refused some of his insulin, he states "I know I should have gotten it and when they rechecked my glucose this morning it was in the 370s, I just could not get up.  I plan on getting my insulin today".  He denies adverse effects on Abilify.  He denies current SI, HI, AVH, and paranoia.  When I asked him if he had any questions, he states "my family is being investigated.  I am [place].  There is a banana spider in my stomach".  When I asked him where he was living before, he stated that he was living in [place] but he does not want to return and states "this guy wiped his genitals and wiped it on me and I know it because it smelled like pussy juice".   Principal Problem: Schizoaffective disorder (HCC) Diagnosis: Principal Problem:   Schizoaffective disorder (HCC) Active Problems:   Uncontrolled type 1  diabetes mellitus with hyperglycemia, with long-term current use of insulin (HCC)  Total Time spent with patient:  I personally spent 35 minutes on the unit in direct patient care. The direct patient care time included face-to-face time with the patient, reviewing the patient's chart, communicating with other professionals, and coordinating care. Greater than 50% of this time was spent in counseling or coordinating care with the patient regarding goals of hospitalization, psycho-education, and discharge planning needs.   Past Psychiatric History:  Schizoaffective Disorder, Bipolar Type and Medication Non-Compliance, 1 Suicide Attempt (2019), and Multiple Psychiatric Hospitalizations [facility], [facility] [place], possibly [employer], [employer] and [facility] "their salad bar slaps").  Past Medical History:  Past Medical History:  Diagnosis Date   Bipolar 1 disorder (HCC)    History of attempted suicide 2019   Unsuccessful suicide attempt in 2019 with rat poison   Schizoaffective disorder (HCC)    Type 1 diabetes mellitus on insulin therapy (HCC) 2019   History reviewed. No pertinent surgical history. Family History:  Family History  Problem Relation Age of Onset   Healthy Mother    Healthy Father    Family Psychiatric  History:  Mother- Crack Cocaine Abuse "Don't Really Know"  Social History:  Social History   Substance and Sexual Activity  Alcohol Use Yes   Comment: social/occassional     Social History   Substance and Sexual Activity  Drug Use Not Currently   Types: Marijuana    Social History  Socioeconomic History   Marital status: Single    Spouse name: Not on file   Number of children: Not on file   Years of education: Not on file   Highest education level: Not on file  Occupational History   Not on file  Tobacco Use   Smoking status: Former    Types: Cigars, Cigarettes   Smokeless tobacco: Never   Tobacco comments:    2 BLACK AND  MILD A DAY  Vaping Use   Vaping status: Every Day  Substance and Sexual Activity   Alcohol use: Yes    Comment: social/occassional   Drug use: Not Currently    Types: Marijuana   Sexual activity: Not on file  Other Topics Concern   Not on file  Social History Narrative   Not on file   Social Drivers of Health   Financial Resource Strain: Not on file  Food Insecurity: Food Insecurity Present (12/26/2023)   Hunger Vital Sign    Worried About Running Out of Food in the Last Year: Sometimes true    Ran Out of Food in the Last Year: Sometimes true  Transportation Needs: No Transportation Needs (12/26/2023)   PRAPARE - Administrator, Civil Service (Medical): No    Lack of Transportation (Non-Medical): No  Physical Activity: Not on file  Stress: Not on file  Social Connections: Socially Isolated (12/26/2023)   Social Connection and Isolation Panel [NHANES]    Frequency of Communication with Friends and Family: Never    Frequency of Social Gatherings with Friends and Family: Never    Attends Religious Services: Never    Database administrator or Organizations: No    Attends Engineer, structural: Never    Marital Status: Never married   Additional Social History:                         Sleep: Good  Appetite:  Good  Current Medications: Current Facility-Administered Medications  Medication Dose Route Frequency Provider Last Rate Last Admin   acetaminophen (TYLENOL) tablet 650 mg  650 mg Oral Q6H PRN Margaretmary Dys, MD       alum & mag hydroxide-simeth (MAALOX/MYLANTA) 200-200-20 MG/5ML suspension 30 mL  30 mL Oral Q4H PRN Margaretmary Dys, MD       ARIPiprazole (ABILIFY) tablet 5 mg  5 mg Oral Daily Lauro Franklin, MD   5 mg at 12/30/23 4098   haloperidol (HALDOL) tablet 5 mg  5 mg Oral TID PRN Margaretmary Dys, MD       And   diphenhydrAMINE (BENADRYL) capsule 50 mg  50 mg Oral TID PRN Margaretmary Dys, MD       haloperidol lactate (HALDOL) injection 5 mg  5 mg Intramuscular TID PRN Margaretmary Dys, MD   5 mg at 12/27/23 2125   And   diphenhydrAMINE (BENADRYL) injection 50 mg  50 mg Intramuscular TID PRN Margaretmary Dys, MD   50 mg at 12/27/23 2126   And   LORazepam (ATIVAN) injection 2 mg  2 mg Intramuscular TID PRN Margaretmary Dys, MD   2 mg at 12/27/23 2126   haloperidol lactate (HALDOL) injection 10 mg  10 mg Intramuscular TID PRN Margaretmary Dys, MD       And   diphenhydrAMINE (BENADRYL) injection 50 mg  50 mg Intramuscular TID PRN Margaretmary Dys, MD  And   LORazepam (ATIVAN) injection 2 mg  2 mg Intramuscular TID PRN Margaretmary Dys, MD       hydrOXYzine (ATARAX) tablet 10 mg  10 mg Oral TID PRN Margaretmary Dys, MD   10 mg at 12/28/23 2045   insulin aspart (novoLOG) injection 0-5 Units  0-5 Units Subcutaneous QHS Onuoha, Chinwendu V, NP   2 Units at 12/28/23 2040   insulin aspart (novoLOG) injection 0-9 Units  0-9 Units Subcutaneous TID WC Onuoha, Chinwendu V, NP   9 Units at 12/30/23 0541   insulin glargine-yfgn (SEMGLEE) injection 24 Units  24 Units Subcutaneous Q2200 Onuoha, Chinwendu V, NP   24 Units at 12/28/23 2041   magnesium hydroxide (MILK OF MAGNESIA) suspension 30 mL  30 mL Oral Daily PRN Margaretmary Dys, MD       nicotine (NICODERM CQ - dosed in mg/24 hours) patch 14 mg  14 mg Transdermal Q0600 Margaretmary Dys, MD       traZODone (DESYREL) tablet 50 mg  50 mg Oral QHS PRN Margaretmary Dys, MD   50 mg at 12/28/23 2045    Lab Results:  Results for orders placed or performed during the hospital encounter of 12/26/23 (from the past 48 hours)  Glucose, capillary     Status: Abnormal   Collection Time: 12/28/23 12:01 PM  Result Value Ref Range   Glucose-Capillary 429 (H) 70 - 99 mg/dL     Comment: Glucose reference range applies only to samples taken after fasting for at least 8 hours.  Glucose, capillary     Status: Abnormal   Collection Time: 12/28/23 12:36 PM  Result Value Ref Range   Glucose-Capillary 399 (H) 70 - 99 mg/dL    Comment: Glucose reference range applies only to samples taken after fasting for at least 8 hours.  Glucose, capillary     Status: Abnormal   Collection Time: 12/28/23  3:16 PM  Result Value Ref Range   Glucose-Capillary 167 (H) 70 - 99 mg/dL    Comment: Glucose reference range applies only to samples taken after fasting for at least 8 hours.  Glucose, capillary     Status: Abnormal   Collection Time: 12/28/23  5:24 PM  Result Value Ref Range   Glucose-Capillary 195 (H) 70 - 99 mg/dL    Comment: Glucose reference range applies only to samples taken after fasting for at least 8 hours.  Glucose, capillary     Status: Abnormal   Collection Time: 12/28/23  7:59 PM  Result Value Ref Range   Glucose-Capillary 208 (H) 70 - 99 mg/dL    Comment: Glucose reference range applies only to samples taken after fasting for at least 8 hours.   Comment 1 Notify RN   Glucose, capillary     Status: None   Collection Time: 12/29/23  5:40 AM  Result Value Ref Range   Glucose-Capillary 89 70 - 99 mg/dL    Comment: Glucose reference range applies only to samples taken after fasting for at least 8 hours.   Comment 1 Notify RN   Basic metabolic panel     Status: Abnormal   Collection Time: 12/29/23  6:48 AM  Result Value Ref Range   Sodium 133 (L) 135 - 145 mmol/L   Potassium 3.9 3.5 - 5.1 mmol/L   Chloride 98 98 - 111 mmol/L   CO2 26 22 - 32 mmol/L   Glucose, Bld 77 70 - 99 mg/dL  Comment: Glucose reference range applies only to samples taken after fasting for at least 8 hours.   BUN 15 6 - 20 mg/dL   Creatinine, Ser 4.09 (L) 0.61 - 1.24 mg/dL   Calcium 8.5 (L) 8.9 - 10.3 mg/dL   GFR, Estimated >81 >19 mL/min    Comment: (NOTE) Calculated using the CKD-EPI  Creatinine Equation (2021)    Anion gap 9 5 - 15    Comment: Performed at [facility], [address], [place], [place] [address]  Glucose, capillary     Status: Abnormal   Collection Time: 12/29/23 12:12 PM  Result Value Ref Range   Glucose-Capillary 163 (H) 70 - 99 mg/dL    Comment: Glucose reference range applies only to samples taken after fasting for at least 8 hours.  Glucose, capillary     Status: Abnormal   Collection Time: 12/29/23  5:00 PM  Result Value Ref Range   Glucose-Capillary 325 (H) 70 - 99 mg/dL    Comment: Glucose reference range applies only to samples taken after fasting for at least 8 hours.  Glucose, capillary     Status: Abnormal   Collection Time: 12/29/23  7:51 PM  Result Value Ref Range   Glucose-Capillary 254 (H) 70 - 99 mg/dL    Comment: Glucose reference range applies only to samples taken after fasting for at least 8 hours.  Glucose, capillary     Status: Abnormal   Collection Time: 12/30/23  5:38 AM  Result Value Ref Range   Glucose-Capillary 373 (H) 70 - 99 mg/dL    Comment: Glucose reference range applies only to samples taken after fasting for at least 8 hours.  Basic metabolic panel     Status: Abnormal   Collection Time: 12/30/23  6:24 AM  Result Value Ref Range   Sodium 127 (L) 135 - 145 mmol/L   Potassium 4.0 3.5 - 5.1 mmol/L   Chloride 94 (L) 98 - 111 mmol/L   CO2 25 22 - 32 mmol/L   Glucose, Bld 368 (H) 70 - 99 mg/dL    Comment: Glucose reference range applies only to samples taken after fasting for at least 8 hours.   BUN 16 6 - 20 mg/dL   Creatinine, Ser 9.56 0.61 - 1.24 mg/dL   Calcium 8.7 (L) 8.9 - 10.3 mg/dL   GFR, Estimated >21 >30 mL/min    Comment: (NOTE) Calculated using the CKD-EPI Creatinine Equation (2021)    Anion gap 8 5 - 15    Comment: Performed at [facility], [address] [place], [place] [address]    Blood Alcohol level:  Lab Results  Component Value Date   [facility] <10  12/23/2023   ETH <10 12/12/2023    Metabolic Disorder Labs: Lab Results  Component Value Date   HGBA1C 11.4 (H) 10/23/2023   MPG 280.48 10/23/2023   MPG >398 07/28/2020   Lab Results  Component Value Date   PROLACTIN 18.7 12/27/2023   Lab Results  Component Value Date   CHOL 200 12/27/2023   TRIG 194 (H) 12/27/2023   HDL 84 12/27/2023   CHOLHDL 2.4 12/27/2023   VLDL 39 12/27/2023   LDLCALC 77 12/27/2023   LDLCALC 125 (H) 03/03/2022    Physical Findings: AIMS:  , ,  ,  ,    CIWA:    COWS:     Musculoskeletal: Strength & Muscle Tone: within normal limits Gait & Station: normal Patient leans: N/A  Psychiatric Specialty Exam:  Presentation  General Appearance:  Fairly Groomed; Casual  Eye Contact: Fair  Speech: Clear and Coherent  Speech Volume: Normal  Handedness: Right   Mood and Affect  Mood: Euthymic  Affect: Congruent   Thought Process  Thought Processes: Irrevelant  Descriptions of Associations:Loose  Orientation:Full (Time, Place and Person)  Thought Content:Illogical  History of Schizophrenia/Schizoaffective disorder:Yes  Duration of Psychotic Symptoms:Greater than six months  Hallucinations:Hallucinations: None  Ideas of Reference:Paranoia  Suicidal Thoughts:Suicidal Thoughts: No  Homicidal Thoughts:Homicidal Thoughts: No   Sensorium  Memory: Remote Fair  Judgment: Impaired  Insight: Lacking   Executive Functions  Concentration: Fair  Attention Span: Fair  Recall: [employer] of Knowledge: Fair  Language: Fair   Psychomotor Activity  Psychomotor Activity: Psychomotor Activity: Normal   Assets  Assets: Resilience   Sleep  Sleep: Sleep: Fair Number of Hours of Sleep: 6.5    Physical Exam: Physical Exam Vitals and nursing note reviewed.  Constitutional:      General: He is not in acute distress.    Appearance: Normal appearance. He is normal weight. He is not ill-appearing or  toxic-appearing.  HENT:     Head: Normocephalic and atraumatic.  Pulmonary:     Effort: Pulmonary effort is normal.  Neurological:     Mental Status: He is alert.    Review of Systems  Respiratory:  Negative for cough and shortness of breath.   Cardiovascular:  Negative for chest pain.  Gastrointestinal:  Negative for abdominal pain, constipation, diarrhea, nausea and vomiting.  Neurological:  Negative for dizziness, weakness and headaches.  Psychiatric/Behavioral:  Negative for depression, hallucinations and suicidal ideas. The patient is not nervous/anxious.    Blood pressure (!) 136/94, pulse 95, temperature 98 F (36.7 C), temperature source Oral, resp. rate 18, height 5\' 9"  (1.753 m), weight 58.5 kg, SpO2 98%. Body mass index is 19.05 kg/m.   Treatment Plan Summary: Daily contact with patient to assess and evaluate symptoms and progress in treatment and Medication management  Jacob [name] is a 33 yr old male who presented to [facility] on 2/21 in DKA, he was admitted and treated and then admitted to [facility] on 2/25.  PPHx is significant for Schizoaffective Disorder, Bipolar Type and Medication Non-Compliance, 1 Suicide Attempt (2019), and Multiple Psychiatric Hospitalizations [facility], old [place], possibly [employer], [employer] and [facility] "their salad bar slaps").    Schizoaffective Disorder, Bipolar Type: -Continue Abilify 5 mg daily for psychosis and mood stability -Continue Agitation Protocol: Haldol/Ativan/Benadryl   Diabetes Type 1: -Continue SSI -Continue Semglee 24 units QHS -Restrict to one tray per meal and no snacks   Nicotine Dependence: -Continue Nicotine Patch 14 mg daily   -Continue PRN's: Tylenol, Maalox, Atarax, Milk of Magnesia, Trazodone   --  The risks/benefits/side-effects/alternatives to this medication were discussed in detail with the patient and time was given for questions. The patient consents to medication trial.                 -- Metabolic profile and EKG monitoring obtained while on an atypical antipsychotic (BMI: Lipid Panel: HbgA1c: QTc:)              -- Encouraged patient to participate in unit milieu and in scheduled group therapies          Safety and Monitoring:             -- Involuntary admission to inpatient psychiatric unit for safety, stabilization and treatment             --  Daily contact with patient to assess and evaluate symptoms and progress in treatment             -- Patient's case to be discussed in multi-disciplinary team meeting             -- Observation Level : q15 minute checks             -- Vital signs:  q12 hours             -- Precautions: suicide, elopement, and assault  Discharge Planning:              -- Social work and case management to assist with discharge planning and identification of hospital follow-up needs prior to discharge             -- Estimated LOS: 5-7 more days             -- Discharge Concerns: Need to establish a safety plan; Medication compliance and effectiveness             -- Discharge Goals: Return home with outpatient referrals for mental health follow-up including medication management/psychotherapy   Lance Muss, MD 12/30/2023, 9:51 AM

## 2023-12-30 NOTE — [facility] Group Notes (Signed)
 Adult Psychoeducational Group Note  Date:  12/30/2023 Time:  9:35 AM  Group Topic/Focus:  Goals Group:   The focus of this group is to help patients establish daily goals to achieve during treatment and discuss how the patient can incorporate goal setting into their daily lives to aide in recovery. Orientation:   The focus of this group is to educate the patient on the purpose and policies of crisis stabilization and provide a format to answer questions about their admission.  The group details unit policies and expectations of patients while admitted.  Participation Level:  Active  Participation Quality:  Appropriate  Affect:  Appropriate  Cognitive:  Appropriate  Insight: Appropriate  Engagement in Group:  Engaged  Modes of Intervention:  Discussion  Additional Comments:  Pt shared goals with group.  Lucilla Edin 12/30/2023, 9:35 AM

## 2023-12-30 NOTE — Group Note (Signed)
 Recreation Therapy Group Note   Group Topic:Communication  Group Date: 12/30/2023 Start Time: 1006 End Time: 1020 Facilitators: Casper Pagliuca-McCall, LRT,CTRS Location: [address] Dayroom   Group Topic: Communication, Team Building, Problem Solving  Goal Area(s) Addresses:  Patient will effectively work with peer towards shared goal.  Patient will identify skills used to make activity successful.  Patient will identify how skills used during activity can be applied to reach post d/c goals.   Intervention: STEM Activity- Glass blower/designer  Activity: Tallest [employer]. In teams of 5-6, patients were given 11 craft pipe cleaners. Using the materials provided, patients were instructed to compete again the opposing team(s) to build the tallest free-standing structure from floor level. The activity was timed; difficulty increased by Clinical research associate as Production designer, theatre/television/film continued.  Systematically resources were removed with additional directions for example, placing one arm behind their back, working in silence, and shape stipulations. LRT facilitated post-activity discussion reviewing team processes and necessary communication skills involved in completion. Patients were encouraged to reflect how the skills utilized, or not utilized, in this activity can be incorporated to positively impact support systems post discharge.  Education: Pharmacist, community, Scientist, physiological, Discharge Planning   Education Outcome: Acknowledges education/In group clarification offered/Needs additional education.   Affect/Mood: Appropriate   Participation Level: None   Participation Quality: None   Behavior: On-looking   Speech/Thought Process: None   Insight: None   Judgement: None   Modes of Intervention: STEM Activity   Patient Response to Interventions:  Disengaged   Education Outcome:  In group clarification offered    Clinical Observations/Individualized Feedback: Pt did not engage in activity.  Pt sat by the door eating snacks that a peer had given him that they were going to throw away, after he asked for them.    Plan: Continue to engage patient in RT group sessions 2-3x/week.   Nishan Ovens-McCall, LRT,CTRS 12/30/2023 12:51 PM

## 2023-12-31 LAB — GLUCOSE, CAPILLARY
Glucose-Capillary: 70 mg/dL (ref 70–99)
Glucose-Capillary: 249 mg/dL — ABNORMAL HIGH (ref 70–99)
Glucose-Capillary: 315 mg/dL — ABNORMAL HIGH (ref 70–99)
Glucose-Capillary: 164 mg/dL — ABNORMAL HIGH (ref 70–99)
Glucose-Capillary: 91 mg/dL (ref 70–99)

## 2023-12-31 LAB — BASIC METABOLIC PANEL
Anion gap: 8 (ref 5–15)
BUN: 12 mg/dL (ref 6–20)
CO2: 29 mmol/L (ref 22–32)
Calcium: 8.9 mg/dL (ref 8.9–10.3)
Chloride: 98 mmol/L (ref 98–111)
Creatinine, Ser: 0.68 mg/dL (ref 0.61–1.24)
GFR, Estimated: >60 mL/min (ref >60)
Glucose, Bld: 94 mg/dL (ref 70–99)
Potassium: 4.1 mmol/L (ref 3.5–5.1)
Sodium: 135 mmol/L (ref 135–145)

## 2023-12-31 MED ORDER — INSULIN GLARGINE 100 UNIT/ML ~~LOC~~ SOLN
22.0000 [IU] | Freq: Every day | SUBCUTANEOUS | Status: DC
  Administered 2023-12-31 – 2024-01-02 (×3): 22 [IU] via SUBCUTANEOUS
  Filled 2023-12-31 (×4): qty 0.22

## 2023-12-31 NOTE — [facility] Group Notes (Signed)
 Adult Psychoeducational Group Note  Date:  12/31/2023 Time:  3:10 PM  Group Topic/Focus:  Goals Group:   The focus of this group is to help patients establish daily goals to achieve during treatment and discuss how the patient can incorporate goal setting into their daily lives to aide in recovery. Orientation:   The focus of this group is to educate the patient on the purpose and policies of crisis stabilization and provide a format to answer questions about their admission.  The group details unit policies and expectations of patients while admitted.  Participation Level:  Active  Participation Quality:  Appropriate  Affect:  Appropriate  Cognitive:  Appropriate  Insight: Appropriate  Engagement in Group:  Engaged  Modes of Intervention:  Discussion  Additional Comments:  Pt attended the goals group and remained appropriate and engaged throughout the duration of the group.   Fara Olden O 12/31/2023, 3:10 PM

## 2023-12-31 NOTE — Plan of Care (Signed)
  Problem: Coping: Goal: Ability to demonstrate self-control will improve Outcome: Progressing   Problem: Safety: Goal: Periods of time without injury will increase Outcome: Progressing   Problem: Coping: Goal: Ability to adjust to condition or change in health will improve Outcome: Progressing

## 2023-12-31 NOTE — Progress Notes (Signed)
   12/31/23 2045  Psych Admission Type (Psych Patients Only)  Admission Status Voluntary  Psychosocial Assessment  Patient Complaints Other (Comment) (pt c/o snacks, wanting more than he should have)  Eye Contact Fair  Facial Expression Flat  Affect Anxious  Speech Logical/coherent  Interaction Assertive  Motor Activity Restless  Appearance/Hygiene Unremarkable  Behavior Characteristics Appropriate to situation  Mood Anxious;Pleasant  Thought Process  Coherency Circumstantial  Content Delusions  Delusions Paranoid  Perception WDL  Hallucination None reported or observed  Judgment Poor  Confusion None  Danger to Self  Current suicidal ideation? Denies  Danger to Others  Danger to Others None reported or observed

## 2023-12-31 NOTE — Plan of Care (Signed)
   Problem: Safety: Goal: Periods of time without injury will increase Outcome: Progressing

## 2023-12-31 NOTE — Plan of Care (Signed)
   Problem: Education: Goal: Emotional status will improve Outcome: Progressing Goal: Mental status will improve Outcome: Progressing Goal: Verbalization of understanding the information provided will improve Outcome: Progressing

## 2023-12-31 NOTE — Group Note (Signed)
 Date:  12/31/2023 Time:  9:13 PM  Group Topic/Focus:  Wrap-Up Group:   The focus of this group is to help patients review their daily goal of treatment and discuss progress on daily workbooks.    Participation Level:  Active  Participation Quality:  Appropriate  Affect:  Labile  Cognitive:  Oriented  Insight: Limited  Engagement in Group:  Engaged  Modes of Intervention:  Education and Exploration  Additional Comments:  Patient attended and participated in group tonight.  He reports that his goal was to take 2 showers today. He did meet his goal.  The best thing that happened for him was that his blood sugar was 91.  Lita Mains [facility] 12/31/2023, 9:13 PM

## 2023-12-31 NOTE — Progress Notes (Signed)
   12/31/23 0743  Psych Admission Type (Psych Patients Only)  Admission Status Voluntary  Psychosocial Assessment  Patient Complaints Anxiety  Eye Contact Fair  Facial Expression Animated  Affect Preoccupied  Speech Logical/coherent  Interaction Assertive  Motor Activity Restless;Pacing  Appearance/Hygiene Unremarkable  Behavior Characteristics Cooperative  Mood Preoccupied  Thought Process  Coherency Circumstantial  Content Delusions  Delusions Paranoid  Perception WDL  Hallucination None reported or observed  Judgment Poor  Confusion None  Danger to Self  Current suicidal ideation? Denies  Danger to Others  Danger to Others None reported or observed

## 2023-12-31 NOTE — Progress Notes (Signed)
 [facility] MD Progress Note  12/31/2023 9:31 AM Jacob [name]  MRN:  [id number] Subjective:   Jacob [name] is a 33 yr old male who presented to [facility] on 2/21 in DKA, he was admitted and treated and then admitted to [facility] on 2/25.  PPHx is significant for Schizoaffective Disorder, Bipolar Type and Medication Non-Compliance, 1 Suicide Attempt (2019), and Multiple Psychiatric Hospitalizations [facility], old [place], possibly [employer], [employer] and [facility] "their salad bar slaps").   The patient's chart was reviewed and nursing notes were reviewed. Vitals signs: BP 127/90 The patient's case was discussed in multidisciplinary team meeting. Per MAR, patient refuses morning, noon, and bedtime SSI.  He also refused Semglee last night. The following as needed medications were given: atarax 1x, trazodone 1x. Per nursing, patient is presenting disorganized and made remarks about Nazis bringing methamphetamines in the 1940s.   Patient was evaluated on the unit, amenable to sitting and speaking with me this morning. Patient is fixated on food, he is perseverative about the different snacks he could be eating at the moment, states " I had two boiled eggs this morning but I don't think that was enough, I should get some chips and yogurt." He reports sleep was poor last night, reports waking up multiple times in the evening and attributes this to thinking about food. Patient makes some illogical comments about being a "[place] native with rights since I have 2 prior felonies" and asks this interviewer if he qualifies for financial hep form the government due to his background.   Patient denies SI, HI, AVH. He denies paranoid ideations. Delusional thought processes are evident.    Principal Problem: Schizoaffective disorder (HCC) Diagnosis: Principal Problem:   Schizoaffective disorder (HCC) Active Problems:   Uncontrolled type 1 diabetes mellitus with hyperglycemia, with  long-term current use of insulin (HCC)  Total Time spent with patient:  I personally spent 35 minutes on the unit in direct patient care. The direct patient care time included face-to-face time with the patient, reviewing the patient's chart, communicating with other professionals, and coordinating care. Greater than 50% of this time was spent in counseling or coordinating care with the patient regarding goals of hospitalization, psycho-education, and discharge planning needs.   Past Psychiatric History:  Schizoaffective Disorder, Bipolar Type and Medication Non-Compliance, 1 Suicide Attempt (2019), and Multiple Psychiatric Hospitalizations [facility], old [place], possibly [employer], [employer] and [facility] "their salad bar slaps").  Past Medical History:  Past Medical History:  Diagnosis Date   Bipolar 1 disorder (HCC)    History of attempted suicide 2019   Unsuccessful suicide attempt in 2019 with rat poison   Schizoaffective disorder (HCC)    Type 1 diabetes mellitus on insulin therapy (HCC) 2019   History reviewed. No pertinent surgical history. Family History:  Family History  Problem Relation Age of Onset   Healthy Mother    Healthy Father    Family Psychiatric  History:  Mother- Crack Cocaine Abuse "Don't Really Know"  Social History:  Social History   Substance and Sexual Activity  Alcohol Use Yes   Comment: social/occassional     Social History   Substance and Sexual Activity  Drug Use Not Currently   Types: Marijuana    Social History   Socioeconomic History   Marital status: Single    Spouse name: Not on file   Number of children: Not on file   Years of education: Not on file  Highest education level: Not on file  Occupational History   Not on file  Tobacco Use   Smoking status: Former    Types: Cigars, Cigarettes   Smokeless tobacco: Never   Tobacco comments:    2 BLACK AND MILD A DAY  Vaping Use   Vaping status:  Every Day  Substance and Sexual Activity   Alcohol use: Yes    Comment: social/occassional   Drug use: Not Currently    Types: Marijuana   Sexual activity: Not on file  Other Topics Concern   Not on file  Social History Narrative   Not on file   Social Drivers of Health   Financial Resource Strain: Not on file  Food Insecurity: Food Insecurity Present (12/26/2023)   Hunger Vital Sign    Worried About Running Out of Food in the Last Year: Sometimes true    Ran Out of Food in the Last Year: Sometimes true  Transportation Needs: No Transportation Needs (12/26/2023)   PRAPARE - Administrator, Civil Service (Medical): No    Lack of Transportation (Non-Medical): No  Physical Activity: Not on file  Stress: Not on file  Social Connections: Socially Isolated (12/26/2023)   Social Connection and Isolation Panel [NHANES]    Frequency of Communication with Friends and Family: Never    Frequency of Social Gatherings with Friends and Family: Never    Attends Religious Services: Never    Database administrator or Organizations: No    Attends Engineer, structural: Never    Marital Status: Never married    Current Medications: Current Facility-Administered Medications  Medication Dose Route Frequency Provider Last Rate Last Admin   acetaminophen (TYLENOL) tablet 650 mg  650 mg Oral Q6H PRN Margaretmary Dys, MD   650 mg at 12/31/23 0048   alum & mag hydroxide-simeth (MAALOX/MYLANTA) 200-200-20 MG/5ML suspension 30 mL  30 mL Oral Q4H PRN Margaretmary Dys, MD       ARIPiprazole (ABILIFY) tablet 5 mg  5 mg Oral Daily Lauro Franklin, MD   5 mg at 12/31/23 1610   haloperidol (HALDOL) tablet 5 mg  5 mg Oral TID PRN Margaretmary Dys, MD       And   diphenhydrAMINE (BENADRYL) capsule 50 mg  50 mg Oral TID PRN Margaretmary Dys, MD       haloperidol lactate (HALDOL) injection 5 mg  5 mg Intramuscular TID PRN Margaretmary Dys, MD   5 mg at 12/27/23 2125   And   diphenhydrAMINE (BENADRYL) injection 50 mg  50 mg Intramuscular TID PRN Margaretmary Dys, MD   50 mg at 12/27/23 2126   And   LORazepam (ATIVAN) injection 2 mg  2 mg Intramuscular TID PRN Margaretmary Dys, MD   2 mg at 12/27/23 2126   haloperidol lactate (HALDOL) injection 10 mg  10 mg Intramuscular TID PRN Margaretmary Dys, MD       And   diphenhydrAMINE (BENADRYL) injection 50 mg  50 mg Intramuscular TID PRN Margaretmary Dys, MD       And   LORazepam (ATIVAN) injection 2 mg  2 mg Intramuscular TID PRN Margaretmary Dys, MD       hydrOXYzine (ATARAX) tablet 10 mg  10 mg Oral TID PRN Margaretmary Dys, MD   10 mg at 12/30/23 2024   insulin aspart (novoLOG) injection 0-5 Units  0-5 Units Subcutaneous QHS Onuoha, Chinwendu V, NP   2 Units at 12/28/23 2040   insulin aspart (novoLOG) injection 0-9 Units  0-9 Units Subcutaneous TID WC Onuoha, Chinwendu V, NP   7 Units at 12/30/23 1715   insulin aspart (novoLOG) injection 4 Units  4 Units Subcutaneous TID with meals Izediuno, Delight Ovens, MD   4 Units at 12/31/23 0749   insulin glargine (LANTUS) injection 22 Units  22 Units Subcutaneous Q1200 Carrion-Carrero, Dagny Fiorentino, MD       magnesium hydroxide (MILK OF MAGNESIA) suspension 30 mL  30 mL Oral Daily PRN Margaretmary Dys, MD       nicotine (NICODERM CQ - dosed in mg/24 hours) patch 14 mg  14 mg Transdermal Q0600 Margaretmary Dys, MD       traZODone (DESYREL) tablet 50 mg  50 mg Oral QHS PRN Margaretmary Dys, MD   50 mg at 12/30/23 2024    Lab Results:  Results for orders placed or performed during the hospital encounter of 12/26/23 (from the past 48 hours)  Glucose, capillary     Status: Abnormal   Collection Time: 12/29/23 12:12 PM  Result Value Ref Range   Glucose-Capillary 163 (H) 70 - 99 mg/dL     Comment: Glucose reference range applies only to samples taken after fasting for at least 8 hours.  Glucose, capillary     Status: Abnormal   Collection Time: 12/29/23  5:00 PM  Result Value Ref Range   Glucose-Capillary 325 (H) 70 - 99 mg/dL    Comment: Glucose reference range applies only to samples taken after fasting for at least 8 hours.  Glucose, capillary     Status: Abnormal   Collection Time: 12/29/23  7:51 PM  Result Value Ref Range   Glucose-Capillary 254 (H) 70 - 99 mg/dL    Comment: Glucose reference range applies only to samples taken after fasting for at least 8 hours.  Glucose, capillary     Status: Abnormal   Collection Time: 12/30/23  5:38 AM  Result Value Ref Range   Glucose-Capillary 373 (H) 70 - 99 mg/dL    Comment: Glucose reference range applies only to samples taken after fasting for at least 8 hours.  Basic metabolic panel     Status: Abnormal   Collection Time: 12/30/23  6:24 AM  Result Value Ref Range   Sodium 127 (L) 135 - 145 mmol/L   Potassium 4.0 3.5 - 5.1 mmol/L   Chloride 94 (L) 98 - 111 mmol/L   CO2 25 22 - 32 mmol/L   Glucose, Bld 368 (H) 70 - 99 mg/dL    Comment: Glucose reference range applies only to samples taken after fasting for at least 8 hours.   BUN 16 6 - 20 mg/dL   Creatinine, Ser 6.01 0.61 - 1.24 mg/dL   Calcium 8.7 (L) 8.9 - 10.3 mg/dL   GFR, Estimated >09 >32 mL/min    Comment: (NOTE) Calculated using the CKD-EPI Creatinine Equation (2021)    Anion gap 8 5 - 15    Comment: Performed at [facility], [address], [place], [place] [address]  Glucose, capillary     Status: Abnormal   Collection Time: 12/30/23 11:35 AM  Result Value Ref Range   Glucose-Capillary 423 (H) 70 - 99 mg/dL    Comment: Glucose reference range applies only to samples taken after fasting for at least 8 hours.  Glucose, capillary     Status:  Abnormal   Collection Time: 12/30/23  2:08 PM  Result Value Ref Range   Glucose-Capillary 341  (H) 70 - 99 mg/dL    Comment: Glucose reference range applies only to samples taken after fasting for at least 8 hours.  Glucose, capillary     Status: Abnormal   Collection Time: 12/30/23  4:51 PM  Result Value Ref Range   Glucose-Capillary 317 (H) 70 - 99 mg/dL    Comment: Glucose reference range applies only to samples taken after fasting for at least 8 hours.  Glucose, capillary     Status: Abnormal   Collection Time: 12/30/23  7:33 PM  Result Value Ref Range   Glucose-Capillary 167 (H) 70 - 99 mg/dL    Comment: Glucose reference range applies only to samples taken after fasting for at least 8 hours.  Glucose, capillary     Status: Abnormal   Collection Time: 12/30/23  9:03 PM  Result Value Ref Range   Glucose-Capillary 146 (H) 70 - 99 mg/dL    Comment: Glucose reference range applies only to samples taken after fasting for at least 8 hours.  Glucose, capillary     Status: None   Collection Time: 12/31/23  5:50 AM  Result Value Ref Range   Glucose-Capillary 70 70 - 99 mg/dL    Comment: Glucose reference range applies only to samples taken after fasting for at least 8 hours.  Basic metabolic panel     Status: None   Collection Time: 12/31/23  6:20 AM  Result Value Ref Range   Sodium 135 135 - 145 mmol/L    Comment: DELTA CHECK NOTED   Potassium 4.1 3.5 - 5.1 mmol/L   Chloride 98 98 - 111 mmol/L   CO2 29 22 - 32 mmol/L   Glucose, Bld 94 70 - 99 mg/dL    Comment: Glucose reference range applies only to samples taken after fasting for at least 8 hours.   BUN 12 6 - 20 mg/dL   Creatinine, Ser 1.61 0.61 - 1.24 mg/dL   Calcium 8.9 8.9 - 09.6 mg/dL   GFR, Estimated >04 >54 mL/min    Comment: (NOTE) Calculated using the CKD-EPI Creatinine Equation (2021)    Anion gap 8 5 - 15    Comment: Performed at [facility], [address] [place], [place] [address]  Glucose, capillary     Status: Abnormal   Collection Time: 12/31/23  7:47 AM  Result Value Ref Range    Glucose-Capillary 249 (H) 70 - 99 mg/dL    Comment: Glucose reference range applies only to samples taken after fasting for at least 8 hours.    Blood Alcohol level:  Lab Results  Component Value Date   ETH <10 12/23/2023   ETH <10 12/12/2023    Metabolic Disorder Labs: Lab Results  Component Value Date   HGBA1C 11.4 (H) 10/23/2023   MPG 280.48 10/23/2023   MPG >398 07/28/2020   Lab Results  Component Value Date   PROLACTIN 18.7 12/27/2023   Lab Results  Component Value Date   CHOL 200 12/27/2023   TRIG 194 (H) 12/27/2023   HDL 84 12/27/2023   CHOLHDL 2.4 12/27/2023   VLDL 39 12/27/2023   LDLCALC 77 12/27/2023   LDLCALC 125 (H) 03/03/2022     Musculoskeletal: Strength & Muscle Tone: within normal limits Gait & Station: normal Patient leans: N/A  Psychiatric Specialty Exam:  Presentation  General Appearance:  Appropriate for Environment  Eye Contact: Fair  Speech:  Clear and Coherent; Normal Rate  Speech Volume: Normal  Handedness: -- (not assessed)   Mood and Affect  Mood: Euthymic  Affect: Flat   Thought Process  Thought Processes: Irrevelant; Disorganized  Descriptions of Associations:Tangential  Orientation:Partial  Thought Content:Abstract Reasoning; Delusions; Illogical  History of Schizophrenia/Schizoaffective disorder:No  Duration of Psychotic Symptoms:N/A  Hallucinations:Hallucinations: None  Ideas of Reference:Delusions  Suicidal Thoughts:Suicidal Thoughts: No  Homicidal Thoughts:Homicidal Thoughts: No   Sensorium  Memory: Immediate Poor; Recent Poor; Remote Poor  Judgment: Poor  Insight: None   Executive Functions  Concentration: Poor  Attention Span: Poor  Recall: Poor  Fund of Knowledge: Poor  Language: Fair   Psychomotor Activity  Psychomotor Activity: Psychomotor Activity: Normal   Assets  Assets: Intimacy; Talents/Skills   Sleep  Sleep: Sleep: Poor    Physical  Exam: Physical Exam Vitals and nursing note reviewed.  Constitutional:      General: He is not in acute distress.    Appearance: Normal appearance. He is normal weight. He is not ill-appearing or toxic-appearing.  HENT:     Head: Normocephalic and atraumatic.  Pulmonary:     Effort: Pulmonary effort is normal.  Neurological:     Mental Status: He is alert.    Review of Systems  Respiratory:  Negative for cough and shortness of breath.   Cardiovascular:  Negative for chest pain.  Gastrointestinal:  Negative for abdominal pain, constipation, diarrhea, nausea and vomiting.  Neurological:  Negative for dizziness, weakness and headaches.  Psychiatric/Behavioral:  Negative for depression, hallucinations and suicidal ideas. The patient is not nervous/anxious.    Blood pressure (!) 127/90, pulse 74, temperature 98.2 F (36.8 C), temperature source Oral, resp. rate 18, height 5\' 9"  (1.753 m), weight 58.5 kg, SpO2 100%. Body mass index is 19.05 kg/m.   Treatment Plan Summary: Daily contact with patient to assess and evaluate symptoms and progress in treatment and Medication management  Lenus Terriquez is a 33 yr old male who presented to [facility] on 2/21 in DKA, he was admitted and treated and then admitted to [facility] on 2/25.  PPHx is significant for Schizoaffective Disorder, Bipolar Type and Medication Non-Compliance, 1 Suicide Attempt (2019), and Multiple Psychiatric Hospitalizations [facility], old [place], possibly [employer], [employer] and [facility] "their salad bar slaps").    Schizoaffective Disorder, Bipolar Type: -Continue Abilify 5 mg daily for psychosis and mood stability -Continue Agitation Protocol: Haldol/Ativan/Benadryl   T1DM Reviewed on 12/31/23 -- FSG 70 at 5:45 AM > 94> 249 -Continue SSI - Novolog 4U TID w/ meals - Novolog HS coverage -Decreased Lantus 24 to 22 units in evening -Restrict to one tray per meal and no snacks   Nicotine  Dependence: -Continue Nicotine Patch 14 mg daily   -Continue PRN's: Tylenol, Maalox, Atarax, Milk of Magnesia, Trazodone   --  The risks/benefits/side-effects/alternatives to this medication were discussed in detail with the patient and time was given for questions. The patient consents to medication trial.                -- Metabolic profile and EKG monitoring obtained while on an atypical antipsychotic (BMI: Lipid Panel: HbgA1c: QTc:)              -- Encouraged patient to participate in unit milieu and in scheduled group therapies          Safety and Monitoring:             -- Involuntary admission to inpatient psychiatric unit for  safety, stabilization and treatment             -- Daily contact with patient to assess and evaluate symptoms and progress in treatment             -- Patient's case to be discussed in multi-disciplinary team meeting             -- Observation Level : q15 minute checks             -- Vital signs:  q12 hours             -- Precautions: suicide, elopement, and assault  Discharge Planning:              -- Social work and case management to assist with discharge planning and identification of hospital follow-up needs prior to discharge             -- Estimated LOS: 5-7 more days             -- Discharge Concerns: Need to establish a safety plan; Medication compliance and effectiveness             -- Discharge Goals: Return home with outpatient referrals for mental health follow-up including medication management/psychotherapy  Signed: [name], MD 12/31/2023, 9:31 AM

## 2024-01-01 LAB — GLUCOSE, CAPILLARY
Glucose-Capillary: 149 mg/dL — ABNORMAL HIGH (ref 70–99)
Glucose-Capillary: 225 mg/dL — ABNORMAL HIGH (ref 70–99)
Glucose-Capillary: 306 mg/dL — ABNORMAL HIGH (ref 70–99)
Glucose-Capillary: 56 mg/dL — ABNORMAL LOW (ref 70–99)
Glucose-Capillary: 79 mg/dL (ref 70–99)
Glucose-Capillary: 206 mg/dL — ABNORMAL HIGH (ref 70–99)

## 2024-01-01 NOTE — Plan of Care (Signed)
   Problem: Education: Goal: Emotional status will improve Outcome: Progressing Goal: Mental status will improve Outcome: Progressing Goal: Verbalization of understanding the information provided will improve Outcome: Progressing

## 2024-01-01 NOTE — Progress Notes (Signed)
 [facility] MD Progress Note  01/01/2024 7:56 AM Jacob [name]  MRN:  [id number] Subjective:   Jacob [name] is a 33 yr old male who presented to [facility] on 2/21 in DKA, he was admitted and treated and then admitted to [facility] on 2/25.  PPHx is significant for Schizoaffective Disorder, Bipolar Type and Medication Non-Compliance, 1 Suicide Attempt (2019), and Multiple Psychiatric Hospitalizations [facility], old [place], possibly [employer], [employer] and [facility] "their salad bar slaps").   The patient's chart was reviewed and nursing notes were reviewed. Vitals signs: BP 112/86 The patient's case was discussed in multidisciplinary team meeting. Per [facility], patient has continued to take scheduled medications as prescribed. The following as needed medications were given: atarax 1x, trazodone 1x. Per nursing, patient is preoccupied with snacks and has repeatedly asked staff for food throughout the day.  Patient was evaluated in his room, laying in his bed.  Her room appears unkempt and disheveled, patient is malodorous.  He reports adequate sleep.  Reports his appetite to be a challenge, reports he had headaches last night, believes that 3 meals a day are not sufficient for him.  He describes his mood as "chill, relaxed".  He rates his depression 0/10 and anxiety 7/10, 10 being most severe.  He attributes the majority of his anxiety related to food, he reports he is daydreaming about granola and putting honey on his food.  Patient reports no adverse side effects to his current psychotropic medications.  Reports no somatic complaints including polyuria.  Principal Problem: Schizoaffective disorder (HCC) Diagnosis: Principal Problem:   Schizoaffective disorder (HCC) Active Problems:   Uncontrolled type 1 diabetes mellitus with hyperglycemia, with long-term current use of insulin (HCC)  Total Time spent with patient:  I personally spent 35 minutes on the unit in direct patient care. The  direct patient care time included face-to-face time with the patient, reviewing the patient's chart, communicating with other professionals, and coordinating care. Greater than 50% of this time was spent in counseling or coordinating care with the patient regarding goals of hospitalization, psycho-education, and discharge planning needs.   Past Psychiatric History:  Schizoaffective Disorder, Bipolar Type and Medication Non-Compliance, 1 Suicide Attempt (2019), and Multiple Psychiatric Hospitalizations [facility], old [place], possibly [employer], [employer] and [facility] "their salad bar slaps").  Past Medical History:  Past Medical History:  Diagnosis Date   Bipolar 1 disorder (HCC)    History of attempted suicide 2019   Unsuccessful suicide attempt in 2019 with rat poison   Schizoaffective disorder (HCC)    Type 1 diabetes mellitus on insulin therapy (HCC) 2019   History reviewed. No pertinent surgical history. Family History:  Family History  Problem Relation Age of Onset   Healthy Mother    Healthy Father    Family Psychiatric  History:  Mother- Crack Cocaine Abuse "Don't Really Know"  Social History:  Social History   Substance and Sexual Activity  Alcohol Use Yes   Comment: social/occassional     Social History   Substance and Sexual Activity  Drug Use Not Currently   Types: Marijuana    Social History   Socioeconomic History   Marital status: Single    Spouse name: Not on file   Number of children: Not on file   Years of education: Not on file   Highest education level: Not on file  Occupational History   Not on file  Tobacco Use   Smoking status: Former  Types: Cigars, Cigarettes   Smokeless tobacco: Never   Tobacco comments:    2 BLACK AND MILD A DAY  Vaping Use   Vaping status: Every Day  Substance and Sexual Activity   Alcohol use: Yes    Comment: social/occassional   Drug use: Not Currently    Types: Marijuana    Sexual activity: Not on file  Other Topics Concern   Not on file  Social History Narrative   Not on file   Social Drivers of Health   Financial Resource Strain: Not on file  Food Insecurity: Food Insecurity Present (12/26/2023)   Hunger Vital Sign    Worried About Running Out of Food in the Last Year: Sometimes true    Ran Out of Food in the Last Year: Sometimes true  Transportation Needs: No Transportation Needs (12/26/2023)   PRAPARE - Administrator, Civil Service (Medical): No    Lack of Transportation (Non-Medical): No  Physical Activity: Not on file  Stress: Not on file  Social Connections: Socially Isolated (12/26/2023)   Social Connection and Isolation Panel [NHANES]    Frequency of Communication with Friends and Family: Never    Frequency of Social Gatherings with Friends and Family: Never    Attends Religious Services: Never    Database administrator or Organizations: No    Attends Engineer, structural: Never    Marital Status: Never married    Current Medications: Current Facility-Administered Medications  Medication Dose Route Frequency Provider Last Rate Last Admin   acetaminophen (TYLENOL) tablet 650 mg  650 mg Oral Q6H PRN Margaretmary Dys, MD   650 mg at 12/31/23 0048   alum & mag hydroxide-simeth (MAALOX/MYLANTA) 200-200-20 MG/5ML suspension 30 mL  30 mL Oral Q4H PRN Margaretmary Dys, MD       ARIPiprazole (ABILIFY) tablet 5 mg  5 mg Oral Daily Lauro Franklin, MD   5 mg at 12/31/23 1610   haloperidol (HALDOL) tablet 5 mg  5 mg Oral TID PRN Margaretmary Dys, MD       And   diphenhydrAMINE (BENADRYL) capsule 50 mg  50 mg Oral TID PRN Margaretmary Dys, MD       haloperidol lactate (HALDOL) injection 5 mg  5 mg Intramuscular TID PRN Margaretmary Dys, MD   5 mg at 12/27/23 2125   And   diphenhydrAMINE (BENADRYL) injection 50 mg  50 mg Intramuscular TID PRN Margaretmary Dys, MD   50 mg at 12/27/23 2126   And   LORazepam (ATIVAN) injection 2 mg  2 mg Intramuscular TID PRN Margaretmary Dys, MD   2 mg at 12/27/23 2126   haloperidol lactate (HALDOL) injection 10 mg  10 mg Intramuscular TID PRN Margaretmary Dys, MD       And   diphenhydrAMINE (BENADRYL) injection 50 mg  50 mg Intramuscular TID PRN Margaretmary Dys, MD       And   LORazepam (ATIVAN) injection 2 mg  2 mg Intramuscular TID PRN Margaretmary Dys, MD       hydrOXYzine (ATARAX) tablet 10 mg  10 mg Oral TID PRN Margaretmary Dys, MD   10 mg at 12/31/23 2030   insulin aspart (novoLOG) injection 0-5 Units  0-5 Units Subcutaneous QHS Onuoha, Chinwendu V, NP   2 Units at 12/28/23 2040   insulin aspart (novoLOG) injection 0-9 Units  0-9  Units Subcutaneous TID WC Onuoha, Chinwendu V, NP   1 Units at 01/01/24 3086   insulin aspart (novoLOG) injection 4 Units  4 Units Subcutaneous TID with meals Izediuno, Delight Ovens, MD   4 Units at 12/31/23 1701   insulin glargine (LANTUS) injection 22 Units  22 Units Subcutaneous Q1200 [name], MD   22 Units at 12/31/23 1213   magnesium hydroxide (MILK OF MAGNESIA) suspension 30 mL  30 mL Oral Daily PRN Margaretmary Dys, MD       nicotine (NICODERM CQ - dosed in mg/24 hours) patch 14 mg  14 mg Transdermal Q0600 Margaretmary Dys, MD       traZODone (DESYREL) tablet 50 mg  50 mg Oral QHS PRN Margaretmary Dys, MD   50 mg at 12/31/23 2030    Lab Results:  Results for orders placed or performed during the hospital encounter of 12/26/23 (from the past 48 hours)  Glucose, capillary     Status: Abnormal   Collection Time: 12/30/23 11:35 AM  Result Value Ref Range   Glucose-Capillary 423 (H) 70 - 99 mg/dL    Comment: Glucose reference range applies only to samples taken after fasting for at least 8 hours.  Glucose, capillary     Status:  Abnormal   Collection Time: 12/30/23  2:08 PM  Result Value Ref Range   Glucose-Capillary 341 (H) 70 - 99 mg/dL    Comment: Glucose reference range applies only to samples taken after fasting for at least 8 hours.  Glucose, capillary     Status: Abnormal   Collection Time: 12/30/23  4:51 PM  Result Value Ref Range   Glucose-Capillary 317 (H) 70 - 99 mg/dL    Comment: Glucose reference range applies only to samples taken after fasting for at least 8 hours.  Glucose, capillary     Status: Abnormal   Collection Time: 12/30/23  7:33 PM  Result Value Ref Range   Glucose-Capillary 167 (H) 70 - 99 mg/dL    Comment: Glucose reference range applies only to samples taken after fasting for at least 8 hours.  Glucose, capillary     Status: Abnormal   Collection Time: 12/30/23  9:03 PM  Result Value Ref Range   Glucose-Capillary 146 (H) 70 - 99 mg/dL    Comment: Glucose reference range applies only to samples taken after fasting for at least 8 hours.  Glucose, capillary     Status: None   Collection Time: 12/31/23  5:50 AM  Result Value Ref Range   Glucose-Capillary 70 70 - 99 mg/dL    Comment: Glucose reference range applies only to samples taken after fasting for at least 8 hours.  Basic metabolic panel     Status: None   Collection Time: 12/31/23  6:20 AM  Result Value Ref Range   Sodium 135 135 - 145 mmol/L    Comment: DELTA CHECK NOTED   Potassium 4.1 3.5 - 5.1 mmol/L   Chloride 98 98 - 111 mmol/L   CO2 29 22 - 32 mmol/L   Glucose, Bld 94 70 - 99 mg/dL    Comment: Glucose reference range applies only to samples taken after fasting for at least 8 hours.   BUN 12 6 - 20 mg/dL   Creatinine, Ser 5.78 0.61 - 1.24 mg/dL   Calcium 8.9 8.9 - 46.9 mg/dL   GFR, Estimated >62 >95 mL/min    Comment: (NOTE) Calculated using the CKD-EPI Creatinine Equation (2021)  Anion gap 8 5 - 15    Comment: Performed at [facility], [address], [place], [place] [address]  Glucose,  capillary     Status: Abnormal   Collection Time: 12/31/23  7:47 AM  Result Value Ref Range   Glucose-Capillary 249 (H) 70 - 99 mg/dL    Comment: Glucose reference range applies only to samples taken after fasting for at least 8 hours.  Glucose, capillary     Status: Abnormal   Collection Time: 12/31/23 11:50 AM  Result Value Ref Range   Glucose-Capillary 315 (H) 70 - 99 mg/dL    Comment: Glucose reference range applies only to samples taken after fasting for at least 8 hours.  Glucose, capillary     Status: Abnormal   Collection Time: 12/31/23  4:59 PM  Result Value Ref Range   Glucose-Capillary 164 (H) 70 - 99 mg/dL    Comment: Glucose reference range applies only to samples taken after fasting for at least 8 hours.  Glucose, capillary     Status: None   Collection Time: 12/31/23  7:27 PM  Result Value Ref Range   Glucose-Capillary 91 70 - 99 mg/dL    Comment: Glucose reference range applies only to samples taken after fasting for at least 8 hours.  Glucose, capillary     Status: Abnormal   Collection Time: 01/01/24  6:29 AM  Result Value Ref Range   Glucose-Capillary 149 (H) 70 - 99 mg/dL    Comment: Glucose reference range applies only to samples taken after fasting for at least 8 hours.    Blood Alcohol level:  Lab Results  Component Value Date   ETH <10 12/23/2023   ETH <10 12/12/2023    Metabolic Disorder Labs: Lab Results  Component Value Date   HGBA1C 11.4 (H) 10/23/2023   MPG 280.48 10/23/2023   MPG >398 07/28/2020   Lab Results  Component Value Date   PROLACTIN 18.7 12/27/2023   Lab Results  Component Value Date   CHOL 200 12/27/2023   TRIG 194 (H) 12/27/2023   HDL 84 12/27/2023   CHOLHDL 2.4 12/27/2023   VLDL 39 12/27/2023   LDLCALC 77 12/27/2023   LDLCALC 125 (H) 03/03/2022     Musculoskeletal: Strength & Muscle Tone: within normal limits Gait & Station: normal Patient leans: N/A  Psychiatric Specialty Exam:  Presentation  General  Appearance:  Appropriate for Environment  Eye Contact: Fair  Speech: Clear and Coherent; Normal Rate  Speech Volume: Normal  Handedness: -- (not assessed)   Mood and Affect  Mood: Euthymic  Affect: Flat   Thought Process  Thought Processes: Irrevelant; Disorganized  Descriptions of Associations:Tangential  Orientation:Partial  Thought Content:Abstract Reasoning; Delusions; Illogical  History of Schizophrenia/Schizoaffective disorder:No  Duration of Psychotic Symptoms:N/A  Hallucinations:Hallucinations: None  Ideas of Reference:Delusions  Suicidal Thoughts:Suicidal Thoughts: No  Homicidal Thoughts:Homicidal Thoughts: No   Sensorium  Memory: Immediate Poor; Recent Poor; Remote Poor  Judgment: Poor  Insight: None   Executive Functions  Concentration: Poor  Attention Span: Poor  Recall: Poor  Fund of Knowledge: Poor  Language: Fair   Psychomotor Activity  Psychomotor Activity: Psychomotor Activity: Normal   Assets  Assets: Intimacy; Talents/Skills   Sleep  Sleep: Sleep: Poor  Physical Exam: Physical Exam Vitals and nursing note reviewed.  Constitutional:      General: He is not in acute distress.    Appearance: Normal appearance. He is normal weight. He is not ill-appearing or toxic-appearing.  HENT:  Head: Normocephalic and atraumatic.  Pulmonary:     Effort: Pulmonary effort is normal.  Neurological:     Mental Status: He is alert.    Review of Systems  Respiratory:  Negative for cough and shortness of breath.   Cardiovascular:  Negative for chest pain.  Gastrointestinal:  Negative for abdominal pain, constipation, diarrhea, nausea and vomiting.  Neurological:  Negative for dizziness, weakness and headaches.  Psychiatric/Behavioral:  Negative for depression, hallucinations and suicidal ideas. The patient is not nervous/anxious.    Blood pressure 112/86, pulse 80, temperature 98.3 F (36.8 C), temperature  source Oral, resp. rate 18, height 5\' 9"  (1.753 m), weight 58.5 kg, SpO2 100%. Body mass index is 19.05 kg/m.   Treatment Plan Summary: Daily contact with patient to assess and evaluate symptoms and progress in treatment and Medication management  Jacob [name] is a 33 yr old male who presented to [facility] on 2/21 in DKA, he was admitted and treated and then admitted to [facility] on 2/25.  PPHx is significant for Schizoaffective Disorder, Bipolar Type and Medication Non-Compliance, 1 Suicide Attempt (2019), and Multiple Psychiatric Hospitalizations [facility], old [place], possibly [employer], [employer] and [facility] [place] "their salad bar slaps").    Schizoaffective Disorder, Bipolar Type: -Continue Abilify 5 mg daily for psychosis and mood stability -Continue Agitation Protocol: Haldol/Ativan/Benadryl   T1DM Reviewed on 01/01/24 -- FSG 70 at 5:45 AM > 94> 249 -Continue SSI - Novolog 4U TID w/ meals - Novolog HS coverage -Decreased Lantus 24 to 22 units in evening -Restrict to one tray per meal and no snacks   Nicotine Dependence: -Continue Nicotine Patch 14 mg daily -Continue PRN's: Tylenol, Maalox, Atarax, Milk of Magnesia, Trazodone --  The risks/benefits/side-effects/alternatives to this medication were discussed in detail with the patient and time was given for questions. The patient consents to medication trial.              -- Metabolic profile and EKG monitoring obtained while on an atypical antipsychotic (BMI: Lipid Panel: HbgA1c: QTc:)              -- Encouraged patient to participate in unit milieu and in scheduled group therapies          Safety and Monitoring:             -- Involuntary admission to inpatient psychiatric unit for safety, stabilization and treatment             -- Daily contact with patient to assess and evaluate symptoms and progress in treatment             -- Patient's case to be discussed in multi-disciplinary team meeting              -- Observation Level : q15 minute checks             -- Vital signs:  q12 hours             -- Precautions: suicide, elopement, and assault  Discharge Planning:              -- Social work and case management to assist with discharge planning and identification of hospital follow-up needs prior to discharge             -- Estimated LOS: 5-7 more days             -- Discharge Concerns: Need to establish a safety plan; Medication compliance and effectiveness             --  Discharge Goals: Return home with outpatient referrals for mental health follow-up including medication management/psychotherapy  Signed: [name], MD 01/01/2024, 7:56 AM

## 2024-01-01 NOTE — Progress Notes (Signed)
   01/01/24 2046  Psych Admission Type (Psych Patients Only)  Admission Status Voluntary  Psychosocial Assessment  Patient Complaints Anxiety  Eye Contact Fair  Facial Expression Flat  Affect Anxious  Speech Logical/coherent  Interaction Assertive  Motor Activity Restless  Appearance/Hygiene Unremarkable  Behavior Characteristics Appropriate to situation;Anxious  Mood Anxious;Preoccupied (pt fixated on snacks)  Thought Process  Coherency Circumstantial  Content Delusions  Delusions Paranoid  Perception WDL  Hallucination None reported or observed  Judgment Poor  Confusion None  Danger to Self  Current suicidal ideation? Denies  Danger to Others  Danger to Others None reported or observed

## 2024-01-01 NOTE — Group Note (Signed)
 Date:  01/01/2024 Time:  8:58 PM  Group Topic/Focus:  Wrap-Up Group:   The focus of this group is to help patients review their daily goal of treatment and discuss progress on daily workbooks.    Participation Level:  Did Not Attend  Participation Quality:  Appropriate  Affect:  Appropriate  Cognitive:  Appropriate  Insight: Appropriate  Engagement in Group:  Engaged  Modes of Intervention:  Education and Exploration  Additional Comments:  Patient attended and participated in group tonight. He reports that he like that he is hyperactive.  Today he talked to the nurse about thinking before acting.  Lita Mains [facility] 01/01/2024, 8:58 PM

## 2024-01-01 NOTE — Progress Notes (Signed)
 Hypoglycemic Event  CBG: 56  Treatment: 4 oz juice/soda and granola bar  Symptoms: None  Follow-up CBG: Time:2005 CBG Result:79  Possible Reasons for Event: Unknown  Comments/MD notified:Slalon Bobbitt NP    Floyce Stakes

## 2024-01-01 NOTE — Progress Notes (Signed)
   01/01/24 0802  Psych Admission Type (Psych Patients Only)  Admission Status Voluntary  Psychosocial Assessment  Patient Complaints Restlessness  Eye Contact Fair  Facial Expression Animated  Affect Preoccupied  Speech Logical/coherent  Interaction Assertive  Motor Activity Restless;Pacing  Appearance/Hygiene Unremarkable  Behavior Characteristics Restless  Mood Preoccupied  Thought Process  Coherency Circumstantial  Content Delusions;Preoccupation  Delusions Paranoid  Perception WDL  Hallucination None reported or observed  Judgment Poor  Confusion None  Danger to Self  Current suicidal ideation? Denies  Danger to Others  Danger to Others None reported or observed

## 2024-01-02 ENCOUNTER — Other Ambulatory Visit: Payer: Self-pay | Admitting: Internal Medicine

## 2024-01-02 ENCOUNTER — Other Ambulatory Visit: Payer: Self-pay

## 2024-01-02 ENCOUNTER — Other Ambulatory Visit (HOSPITAL_COMMUNITY): Payer: Self-pay

## 2024-01-02 ENCOUNTER — Encounter (HOSPITAL_COMMUNITY): Payer: Self-pay

## 2024-01-02 ENCOUNTER — Other Ambulatory Visit: Payer: Self-pay | Admitting: Student

## 2024-01-02 DIAGNOSIS — E10649 Type 1 diabetes mellitus with hypoglycemia without coma: Secondary | ICD-10-CM

## 2024-01-02 DIAGNOSIS — E109 Type 1 diabetes mellitus without complications: Secondary | ICD-10-CM

## 2024-01-02 LAB — GLUCOSE, CAPILLARY
Glucose-Capillary: 94 mg/dL (ref 70–99)
Glucose-Capillary: 252 mg/dL — ABNORMAL HIGH (ref 70–99)
Glucose-Capillary: 235 mg/dL — ABNORMAL HIGH (ref 70–99)
Glucose-Capillary: 98 mg/dL (ref 70–99)

## 2024-01-02 LAB — VITAMIN D 25 HYDROXY (VIT D DEFICIENCY, FRACTURES): Vit D, 25-Hydroxy: 18.55 ng/mL — ABNORMAL LOW (ref 30–100)

## 2024-01-02 LAB — VITAMIN B12: Vitamin B-12: 800 pg/mL (ref 180–914)

## 2024-01-02 LAB — FOLATE: Folate: 9.4 ng/mL (ref >5.9)

## 2024-01-02 MED ORDER — INSULIN GLARGINE 100 UNIT/ML ~~LOC~~ SOLN: 24.0000 [IU] | Freq: Every day | SUBCUTANEOUS | Status: DC

## 2024-01-02 MED ORDER — ARIPIPRAZOLE 10 MG PO TABS
10.0000 mg | ORAL_TABLET | Freq: Every day | ORAL | Status: DC
  Administered 2024-01-03 – 2024-01-04 (×2): 10 mg via ORAL
  Filled 2024-01-02 (×3): qty 1

## 2024-01-02 MED ORDER — LANTUS SOLOSTAR 100 UNIT/ML ~~LOC~~ SOPN
PEN_INJECTOR | SUBCUTANEOUS | 0 refills | Status: DC
  Filled 2024-01-02: qty 6, 25d supply, fill #0
  Filled 2024-01-05: qty 15, 62d supply, fill #0

## 2024-01-02 NOTE — Telephone Encounter (Signed)
 Rx was resent as Normal (electronically).

## 2024-01-02 NOTE — Progress Notes (Signed)
 The dayroom had to be closed early tonight be cause the patient and another had been very close together.  Patient took a blanket and covered both him and the other patient.  When this worker told them not to do that. He stated they  were cold.  The other patient went an got a blanket of her own. The patient then put his head on her shoulder. The worker redirected him. He became agitated. The day was closed down.

## 2024-01-02 NOTE — BH IP Treatment Plan (Signed)
 Interdisciplinary Treatment and Diagnostic Plan Update  01/02/2024 Time of Session: 1210PM Jacob [name] MRN: [id number]  Principal Diagnosis: Schizoaffective disorder [facility])  Secondary Diagnoses: Principal Problem:   Schizoaffective disorder (HCC) Active Problems:   Uncontrolled type 1 diabetes mellitus with hyperglycemia, with long-term current use of insulin (HCC)   Current Medications:  Current Facility-Administered Medications  Medication Dose Route Frequency Provider Last Rate Last Admin   acetaminophen (TYLENOL) tablet 650 mg  650 mg Oral Q6H PRN Margaretmary Dys, MD   650 mg at 12/31/23 0048   alum & mag hydroxide-simeth (MAALOX/MYLANTA) 200-200-20 MG/5ML suspension 30 mL  30 mL Oral Q4H PRN Margaretmary Dys, MD       [START ON 01/03/2024] ARIPiprazole (ABILIFY) tablet 10 mg  10 mg Oral Daily [name], MD       haloperidol (HALDOL) tablet 5 mg  5 mg Oral TID PRN Margaretmary Dys, MD       And   diphenhydrAMINE (BENADRYL) capsule 50 mg  50 mg Oral TID PRN Margaretmary Dys, MD       haloperidol lactate (HALDOL) injection 5 mg  5 mg Intramuscular TID PRN Margaretmary Dys, MD   5 mg at 12/27/23 2125   And   diphenhydrAMINE (BENADRYL) injection 50 mg  50 mg Intramuscular TID PRN Margaretmary Dys, MD   50 mg at 12/27/23 2126   And   LORazepam (ATIVAN) injection 2 mg  2 mg Intramuscular TID PRN Margaretmary Dys, MD   2 mg at 12/27/23 2126   haloperidol lactate (HALDOL) injection 10 mg  10 mg Intramuscular TID PRN Margaretmary Dys, MD       And   diphenhydrAMINE (BENADRYL) injection 50 mg  50 mg Intramuscular TID PRN Margaretmary Dys, MD       And   LORazepam (ATIVAN) injection 2 mg  2 mg Intramuscular TID PRN Margaretmary Dys, MD       hydrOXYzine (ATARAX) tablet 10 mg  10 mg Oral TID PRN Margaretmary Dys, MD   10 mg at  01/01/24 2218   insulin aspart (novoLOG) injection 0-5 Units  0-5 Units Subcutaneous QHS Onuoha, Jacob V, NP   2 Units at 01/01/24 2223   insulin aspart (novoLOG) injection 0-9 Units  0-9 Units Subcutaneous TID WC Onuoha, Jacob V, NP   5 Units at 01/02/24 1201   insulin aspart (novoLOG) injection 4 Units  4 Units Subcutaneous TID with meals Izediuno, Jacob Ovens, MD   4 Units at 01/02/24 1202   insulin glargine (LANTUS) injection 22 Units  22 Units Subcutaneous Q1200 Jacob Frederick, MD   22 Units at 01/02/24 1209   magnesium hydroxide (MILK OF MAGNESIA) suspension 30 mL  30 mL Oral Daily PRN Margaretmary Dys, MD       nicotine (NICODERM CQ - dosed in mg/24 hours) patch 14 mg  14 mg Transdermal Q0600 Margaretmary Dys, MD       traZODone (DESYREL) tablet 50 mg  50 mg Oral QHS PRN Margaretmary Dys, MD   50 mg at 01/01/24 2218   PTA Medications: Medications Prior to Admission  Medication Sig Dispense Refill Last Dose/Taking   benztropine (COGENTIN) 1 MG tablet Take 1 tablet (1 mg total) by mouth at bedtime.   Unknown   Continuous Glucose Sensor (DEXCOM G7 SENSOR) MISC Place new sensor every 10 days. Use to continuously monitor  blood sugar. 3 each 11 Unknown   glucose blood (ACCU-CHEK GUIDE TEST) test strip Use up to 3 strips a day when not wearing Continuous glucose monitor 50 each 12 Unknown   Insulin Disposable Pump (OMNIPOD 5 DEXG7G6 PODS GEN 5) MISC Use to administer up to 50 units, change pods every 2 days (Patient not taking: Reported on 12/17/2023) 15 each 11 Unknown   insulin lispro (HUMALOG) 100 UNIT/ML KwikPen Inject 10 Units into the skin 3 (three) times daily before meals.   Unknown   Insulin Pen Needle (BD PEN NEEDLE NANO 2ND GEN) 32G X 4 MM MISC Use to inject insulin up to 4 times a day 200 each 3 Unknown   Insulin Pen Needle (BD PEN NEEDLE NANO U/F) 32G X 4 MM MISC Use  3  times daily with insulin. 100 each 0 Unknown   Lancet  Device MISC 1 each by Does not apply route 3 (three) times daily. May dispense any manufacturer covered by patient's insurance. 1 each 0 Unknown   risperiDONE (RISPERDAL) 1 MG tablet Take 1 tablet (1 mg total) by mouth 2 (two) times daily.   Unknown    Patient Stressors:    Patient Strengths:    Treatment Modalities: Medication Management, Group therapy, Case management,  1 to 1 session with clinician, Psychoeducation, Recreational therapy.   Physician Treatment Plan for Primary Diagnosis: Schizoaffective disorder (HCC) Long Term Goal(s): Improvement in symptoms so as ready for discharge   Short Term Goals: Ability to identify changes in lifestyle to reduce recurrence of condition will improve Ability to maintain clinical measurements within normal limits will improve Compliance with prescribed medications will improve Ability to identify triggers associated with substance abuse/mental health issues will improve  Medication Management: Evaluate patient's response, side effects, and tolerance of medication regimen.  Therapeutic Interventions: 1 to 1 sessions, Unit Group sessions and Medication administration.  Evaluation of Outcomes: Progressing  Physician Treatment Plan for Secondary Diagnosis: Principal Problem:   Schizoaffective disorder (HCC) Active Problems:   Uncontrolled type 1 diabetes mellitus with hyperglycemia, with long-term current use of insulin (HCC)  Long Term Goal(s): Improvement in symptoms so as ready for discharge   Short Term Goals: Ability to identify changes in lifestyle to reduce recurrence of condition will improve Ability to maintain clinical measurements within normal limits will improve Compliance with prescribed medications will improve Ability to identify triggers associated with substance abuse/mental health issues will improve     Medication Management: Evaluate patient's response, side effects, and tolerance of medication regimen.  Therapeutic  Interventions: 1 to 1 sessions, Unit Group sessions and Medication administration.  Evaluation of Outcomes: Progressing   RN Treatment Plan for Primary Diagnosis: Schizoaffective disorder (HCC) Long Term Goal(s): Knowledge of disease and therapeutic regimen to maintain health will improve  Short Term Goals: Ability to remain free from injury will improve, Ability to verbalize frustration and anger appropriately will improve, Ability to participate in decision making will improve, Ability to identify and develop effective coping behaviors will improve, and Compliance with prescribed medications will improve  Medication Management: RN will administer medications as ordered by provider, will assess and evaluate patient's response and provide education to patient for prescribed medication. RN will report any adverse and/or side effects to prescribing provider.  Therapeutic Interventions: 1 on 1 counseling sessions, Psychoeducation, Medication administration, Evaluate responses to treatment, Monitor vital signs and CBGs as ordered, Perform/monitor CIWA, COWS, AIMS and Fall Risk screenings as ordered, Perform wound care treatments as ordered.  Evaluation of  Outcomes: Progressing   LCSW Treatment Plan for Primary Diagnosis: Schizoaffective disorder (HCC) Long Term Goal(s): Safe transition to appropriate next level of care at discharge, Engage patient in therapeutic group addressing interpersonal concerns.  Short Term Goals: Engage patient in aftercare planning with referrals and resources, Increase social support, Increase emotional regulation, Identify triggers associated with mental health/substance abuse issues, and Increase skills for wellness and recovery  Therapeutic Interventions: Assess for all discharge needs, 1 to 1 time with Social worker, Explore available resources and support systems, Assess for adequacy in community support network, Educate family and significant other(s) on suicide  prevention, Complete Psychosocial Assessment, Interpersonal group therapy.  Evaluation of Outcomes: Progressing   Progress in Treatment: Attending groups: Yes. Participating in groups: Yes. intrusive Taking medication as prescribed: Yes. Toleration medication: Yes. Family/Significant other contact made: No, will contact:  Penny Pia [phone] Patient understands diagnosis: Yes. Discussing patient identified problems/goals with staff: Yes. Medical problems stabilized or resolved: Yes. Denies suicidal/homicidal ideation: Yes. Issues/concerns per patient self-inventory: No.   New problem(s) identified: No, Describe:  None reported   New Short Term/Long Term Goal(s): medication stabilization, elimination of SI thoughts, development of comprehensive mental wellness plan.      Patient Goals:  "Work on blood sugar and adjust my meds, I am not taking no shot"   Discharge Plan or Barriers: Patient recently admitted. CSW will continue to follow and assess for appropriate referrals and possible discharge planning.      Reason for Continuation of Hospitalization: Delusions  Hallucinations Mania Medication stabilization   Estimated Length of Stay: 5-7 days   Last 3 [place] Suicide Severity Risk Score: Flowsheet Row ED to Hosp-Admission (Current) [facility] Hosp-Admission (Discharged) from 12/23/2023 in [place] [facility] CV PROGRESSIVE [facility]ARE ED from 12/16/2023 in [facility] Emergency Department [facility]  C-SSRS RISK CATEGORY No Risk No Risk No Risk       Last PHQ 2/9 Scores:    08/22/2023    1:55 PM 08/01/2023    8:46 AM 06/29/2023    3:06 PM  Depression screen PHQ 2/9  Decreased Interest 0 0 0  Down, Depressed, Hopeless 0 0 0  PHQ - 2 Score 0 0 0  Altered sleeping  0 0  Change in appetite  0 0  Feeling bad or failure about yourself   0 0  Trouble concentrating  0 0  Moving slowly or fidgety/restless   0 0  Suicidal thoughts  0 0  PHQ-9 Score  0 0    Scribe for Treatment Team: Jacinta Shoe, LCSW 01/02/2024 4:21 PM

## 2024-01-02 NOTE — Group Note (Signed)
 Date:  01/02/2024 Time:  9:58 PM  Group Topic/Focus:  Wrap-Up Group:   The focus of this group is to help patients review their daily goal of treatment and discuss progress on daily workbooks.    Participation Level:  Active  Participation Quality:  Appropriate  Affect:  Blunted and Defensive  Cognitive:  Appropriate  Insight: Limited  Engagement in Group:  Engaged  Modes of Intervention:  Education and Exploration  Additional Comments:  Patient attended and participated in group tonight.  He reports that he like that he has enough money to buy whatever he likes.  Lita Mains [facility] 01/02/2024, 9:58 PM

## 2024-01-02 NOTE — Telephone Encounter (Signed)
 Next appt scheduled 3/10 with Dr Rosaura Carpenter.

## 2024-01-02 NOTE — Progress Notes (Addendum)
 Collateral contact - [facility] (treatment facility) - [phone]  8:20 AM - CSW emailed a referral to [email or web address].    2:19 PM - Marcelino Duster from [facility] informed that patient's medical information is being reviewed by medical staff, and she will contact CSW once a decision has been made.   Collateral contact - [facility] - (treatment facility) - phone:  [phone]  CSW called, and it was confirmed that the referral had been received.   Collateral contact - Linus Orn (mom) [phone]  Mom stated that she wants patient to stay in the hospital as long as possible due to his mental health needs (she did not mention any recent symptoms).  She said she received a bill from [facility], and when the CSW called, she was on the phone Lafonda Mosses from [facility] [phone] trying to resolve it.  Mom said that she has diabetes medication for patient in case he is transitioned to a treatment facility.  She plans to visit patient tonight and bring him clothes that are permitted.  Soumya Colson, LCSWA 01/02/2024

## 2024-01-02 NOTE — Addendum Note (Signed)
 Addended by: Cala Bradford on: 01/02/2024 01:58 PM   Modules accepted: Orders

## 2024-01-02 NOTE — Plan of Care (Signed)
   Problem: Education: Goal: Emotional status will improve Outcome: Progressing

## 2024-01-02 NOTE — Telephone Encounter (Signed)
 Patients mother also stated the patient is supposed to be taking novolog. I do not see that novolog is on the patients medication list.

## 2024-01-02 NOTE — Inpatient Diabetes Management (Signed)
 Inpatient Diabetes Program Recommendations  AACE/ADA: New Consensus Statement on Inpatient Glycemic Control (2015)  Target Ranges:  Prepandial:   less than 140 mg/dL      Peak postprandial:   less than 180 mg/dL (1-2 hours)      Critically ill patients:  140 - 180 mg/dL    Latest Reference Range & Units 01/01/24 06:29 01/01/24 12:04 01/01/24 16:59 01/01/24 19:51 01/01/24 20:17 01/01/24 22:16  Glucose-Capillary 70 - 99 mg/dL 161 (H)  5 units Novolog  225 (H)  7 units Novolog  22 units Lantus  306 (H)  11 units Novolog  56 (L) 79 206 (H)  2 units Novolog     Latest Reference Range & Units 01/02/24 05:43 01/02/24 11:43  Glucose-Capillary 70 - 99 mg/dL 94  4 units Novolog  096 (H)  9 units Novolog  22 unts Semglee   (H): Data is abnormally high    Home DM Meds: Lantus 24 units Daily   Humalog 10 units TID meal coverage    OmniPod Insulin Pump  Current Orders: Semglee 22 units daily      Novolog 0-9 units TID ac/hs  Novolog 4 units TID with meals     MD- Please consider:  Increase the Novolog Meal Coverage to 6 units TID with meals    --Will follow patient during hospitalization--  Ambrose Finland RN, MSN, CDCES Diabetes Coordinator Inpatient Glycemic Control Team Team Pager: [phone] (8a-5p)

## 2024-01-02 NOTE — Plan of Care (Addendum)
 Pt observed pacing hall restless,  fidgety pacing in hall at intervals this shift. Denies SI, HI, AVH and pain when assessed. Reports he slept well with good appetite. Presents with blunted affect, labile, irritable mood, pressured, loud, verbally abusive speech towards staff this shift in protest for more food, snacks especially ice cream. Per pt "You fucking ass bitch, you just don't want to give me no fucking food. I use meth, I fucking eat whatever the fuck I want. You fucking bitch, what the fuck. I don't fucking want you to be my nurse anymore. I don't need your fucking ass". Verbal education ineffective due to pt's mood lability, verbal hostility. Continue to need frequent redirections to comply with current CBG monitoring and meals. Took his scheduled medications without issues. Safety checks maintained at Q 15 minutes intervals. Emotional support, encouragement and reassurance offered.   Problem: Activity: Goal: Interest or engagement in activities will improve Outcome: Progressing   Problem: Safety: Goal: Periods of time without injury will increase Outcome: Progressing   Problem: Coping: Goal: Ability to verbalize frustrations and anger appropriately will improve Outcome: Not Progressing

## 2024-01-02 NOTE — Group Note (Signed)
 LCSW Group Therapy Note   Group Date: 01/02/2024 Start Time: 1300 End Time: 1400  Participation:  patient was present for the last 15 minutes of the group session, and actively participated in the discussion.  Type of Therapy:  Group Therapy   Title:  Stronger Together:  Building Healthy Relationships  Objective: To explore loneliness, boundaries, and safe ways to build relationships.  Goals: Recognize healthy vs. unhealthy relationships. Learn safe ways to connect with others. Strengthen communication and [employer].  Summary: Participants discussed loneliness, healthy connections, and setting boundaries. They explored safe ways to meet people and shared personal experiences. Key insights were reinforced through discussion and quotes.  Therapeutic Modalities Used: Cognitive Behavioral Therapy (CBT) - elements - identifying unhealthy relationship patterns, challenging negative thoughts about connection Dialectical Behavior Therapy (DBT) - elements - setting and maintaining boundaries Supportive Group Therapy - Peer discussion, shared experiences, and emotional validation.   Alla Feeling, LCSWA 01/02/2024  7:48 PM

## 2024-01-02 NOTE — Progress Notes (Signed)
   01/02/24 2200  Psych Admission Type (Psych Patients Only)  Admission Status Voluntary  Psychosocial Assessment  Patient Complaints Agitation;Anxiety;Anger (In relation to food and snacks)  Eye Contact Fair  Facial Expression Flat  Affect Blunted  Speech Logical/coherent  Interaction Defensive;Demanding;Hostile  Motor Activity Pacing;Fidgety  Appearance/Hygiene Unremarkable  Behavior Characteristics Impulsive;Irritable;Intrusive  Mood Labile;Preoccupied  Thought Process  Coherency Circumstantial  Content Delusions  Delusions None reported or observed  Perception WDL  Hallucination None reported or observed  Judgment Poor  Confusion None  Danger to Self  Current suicidal ideation? Denies  Danger to Others  Danger to Others None reported or observed

## 2024-01-02 NOTE — Group Note (Signed)
 Recreation Therapy Group Note   Group Topic:Communication  Group Date: 01/02/2024 Start Time: 1010 End Time: 1030 Facilitators: Aidin Doane-McCall, LRT,CTRS Location: [address] Dayroom   Group Topic: Communication, Problem Solving   Goal Area(s) Addresses:  Patient will effectively listen to complete activity.  Patient will identify communication skills used to make activity successful.  Patient will identify how skills used during activity can be used to reach post d/c goals.    Intervention: Building surveyor Activity - Geometric pattern cards, pencils, blank paper    Activity: Geometric Drawings.  Three volunteers from the peer group will be shown an abstract picture with a particular arrangement of geometrical shapes.  Each round, one 'speaker' will describe the pattern, as accurately as possible without revealing the image to the group.  The remaining group members will listen and draw the picture to reflect how it is described to them. Patients with the role of 'listener' cannot ask clarifying questions but, may request that the speaker repeat a direction. Once the drawings are complete, the presenter will show the rest of the group the picture and compare how close each person came to drawing the picture. LRT will facilitate a post-activity discussion regarding effective communication and the importance of planning, listening, and asking for clarification in daily interactions with others.  Education: Environmental consultant, Active listening, Support systems, Discharge planning  Education Outcome: Acknowledges understanding/In group clarification offered/Needs additional education.     Clinical Observations/Individualized Feedback: Due to acuity on the unit, group was unable to be instructed.     Plan: Continue to engage patient in RT group sessions 2-3x/week.    Makya Yurko-McCall, LRT,CTRS 01/02/2024 1:02 PM

## 2024-01-02 NOTE — Progress Notes (Signed)
 [facility] MD Progress Note  01/02/2024 1:10 PM Jacob [name]  MRN:  [id number] Subjective:   Jacob [name] is a 33 yr old male who presented to [facility] on 2/21 in DKA, he was admitted and treated and then admitted to [facility] on 2/25.  PPHx is significant for Schizoaffective Disorder, Bipolar Type and Medication Non-Compliance, 1 Suicide Attempt (2019), and Multiple Psychiatric Hospitalizations [facility], old [place], possibly [employer], [employer] and [facility] [place] "their salad bar slaps").   The patient's chart was reviewed and nursing notes were reviewed. The patient's case was discussed in multidisciplinary team meeting. Per [name], patient has continued to take scheduled medications as prescribed.   The patient was seen in his room during rounds.  He continues to show poor insight into appropriate dietary modifications for type 1 diabetes.  He continues to present as somewhat disorganized in thought.  His speech is rapid and he is inappropriately happy.  He is a bit grandiose and tells me that he is very wealthy because he bought stocks when he was 7 to 33 years old then diversified when he was 2.  He denies suicidal or homicidal ideations.  He is pleasant and cooperative with the interview.  He tells me that he has been referred to [facility].  We discussed increasing Abilify to 10 mg.  Principal Problem: Schizoaffective disorder (HCC) Diagnosis: Principal Problem:   Schizoaffective disorder (HCC) Active Problems:   Uncontrolled type 1 diabetes mellitus with hyperglycemia, with long-term current use of insulin (HCC)  Total Time spent with patient:  I personally spent 35 minutes on the unit in direct patient care. The direct patient care time included face-to-face time with the patient, reviewing the patient's chart, communicating with other professionals, and coordinating care. Greater than 50% of this time was spent in counseling or coordinating care with the patient regarding goals of  hospitalization, psycho-education, and discharge planning needs.   Past Psychiatric History:  Schizoaffective Disorder, Bipolar Type and Medication Non-Compliance, 1 Suicide Attempt (2019), and Multiple Psychiatric Hospitalizations [facility], old [place], possibly [employer], [employer] and [facility] [place] "their salad bar slaps").  Past Medical History:  Past Medical History:  Diagnosis Date   Bipolar 1 disorder (HCC)    History of attempted suicide 2019   Unsuccessful suicide attempt in 2019 with rat poison   Schizoaffective disorder (HCC)    Type 1 diabetes mellitus on insulin therapy (HCC) 2019   History reviewed. No pertinent surgical history. Family History:  Family History  Problem Relation Age of Onset   Healthy Mother    Healthy Father    Family Psychiatric  History:  Mother- Crack Cocaine Abuse "Don't Really Know"  Social History:  Social History   Substance and Sexual Activity  Alcohol Use Yes   Comment: social/occassional     Social History   Substance and Sexual Activity  Drug Use Not Currently   Types: Marijuana    Social History   Socioeconomic History   Marital status: Single    Spouse name: Not on file   Number of children: Not on file   Years of education: Not on file   Highest education level: Not on file  Occupational History   Not on file  Tobacco Use   Smoking status: Former    Types: Cigars, Cigarettes   Smokeless tobacco: Never   Tobacco comments:    2 BLACK AND MILD A DAY  Vaping Use   Vaping status: Every Day  Substance and  Sexual Activity   Alcohol use: Yes    Comment: social/occassional   Drug use: Not Currently    Types: Marijuana   Sexual activity: Not on file  Other Topics Concern   Not on file  Social History Narrative   Not on file   Social Drivers of Health   Financial Resource Strain: Not on file  Food Insecurity: Food Insecurity Present (12/26/2023)   Hunger Vital Sign    Worried About  Running Out of Food in the Last Year: Sometimes true    Ran Out of Food in the Last Year: Sometimes true  Transportation Needs: No Transportation Needs (12/26/2023)   PRAPARE - Administrator, Civil Service (Medical): No    Lack of Transportation (Non-Medical): No  Physical Activity: Not on file  Stress: Not on file  Social Connections: Socially Isolated (12/26/2023)   Social Connection and Isolation Panel [NHANES]    Frequency of Communication with Friends and Family: Never    Frequency of Social Gatherings with Friends and Family: Never    Attends Religious Services: Never    Database administrator or Organizations: No    Attends Engineer, structural: Never    Marital Status: Never married    Current Medications: Current Facility-Administered Medications  Medication Dose Route Frequency Provider Last Rate Last Admin   acetaminophen (TYLENOL) tablet 650 mg  650 mg Oral Q6H PRN Margaretmary Dys, MD   650 mg at 12/31/23 0048   alum & mag hydroxide-simeth (MAALOX/MYLANTA) 200-200-20 MG/5ML suspension 30 mL  30 mL Oral Q4H PRN Margaretmary Dys, MD       [START ON 01/03/2024] ARIPiprazole (ABILIFY) tablet 10 mg  10 mg Oral Daily [name], MD       haloperidol (HALDOL) tablet 5 mg  5 mg Oral TID PRN Margaretmary Dys, MD       And   diphenhydrAMINE (BENADRYL) capsule 50 mg  50 mg Oral TID PRN Margaretmary Dys, MD       haloperidol lactate (HALDOL) injection 5 mg  5 mg Intramuscular TID PRN Margaretmary Dys, MD   5 mg at 12/27/23 2125   And   diphenhydrAMINE (BENADRYL) injection 50 mg  50 mg Intramuscular TID PRN Margaretmary Dys, MD   50 mg at 12/27/23 2126   And   LORazepam (ATIVAN) injection 2 mg  2 mg Intramuscular TID PRN Margaretmary Dys, MD   2 mg at 12/27/23 2126   haloperidol lactate (HALDOL) injection 10 mg  10 mg Intramuscular TID PRN Margaretmary Dys, MD       And   diphenhydrAMINE (BENADRYL) injection 50 mg  50 mg Intramuscular TID PRN Margaretmary Dys, MD       And   LORazepam (ATIVAN) injection 2 mg  2 mg Intramuscular TID PRN Margaretmary Dys, MD       hydrOXYzine (ATARAX) tablet 10 mg  10 mg Oral TID PRN Margaretmary Dys, MD   10 mg at 01/01/24 2218   insulin aspart (novoLOG) injection 0-5 Units  0-5 Units Subcutaneous QHS Onuoha, Chinwendu V, NP   2 Units at 01/01/24 2223   insulin aspart (novoLOG) injection 0-9 Units  0-9 Units Subcutaneous TID WC Onuoha, Chinwendu V, NP   5 Units at 01/02/24 1201   insulin aspart (novoLOG) injection 4 Units  4 Units Subcutaneous TID with meals Izediuno, Delight Ovens,  MD   4 Units at 01/02/24 1202   insulin glargine (LANTUS) injection 22 Units  22 Units Subcutaneous Q1200 [name], MD   22 Units at 01/02/24 1209   magnesium hydroxide (MILK OF MAGNESIA) suspension 30 mL  30 mL Oral Daily PRN Margaretmary Dys, MD       nicotine (NICODERM CQ - dosed in mg/24 hours) patch 14 mg  14 mg Transdermal Q0600 Margaretmary Dys, MD       traZODone (DESYREL) tablet 50 mg  50 mg Oral QHS PRN Margaretmary Dys, MD   50 mg at 01/01/24 2218    Lab Results:  Results for orders placed or performed during the hospital encounter of 12/26/23 (from the past 48 hours)  Glucose, capillary     Status: Abnormal   Collection Time: 12/31/23  4:59 PM  Result Value Ref Range   Glucose-Capillary 164 (H) 70 - 99 mg/dL    Comment: Glucose reference range applies only to samples taken after fasting for at least 8 hours.  Glucose, capillary     Status: None   Collection Time: 12/31/23  7:27 PM  Result Value Ref Range   Glucose-Capillary 91 70 - 99 mg/dL    Comment: Glucose reference range applies only to samples taken after fasting for at least 8 hours.  Glucose, capillary     Status: Abnormal   Collection Time:  01/01/24  6:29 AM  Result Value Ref Range   Glucose-Capillary 149 (H) 70 - 99 mg/dL    Comment: Glucose reference range applies only to samples taken after fasting for at least 8 hours.  Glucose, capillary     Status: Abnormal   Collection Time: 01/01/24 12:04 PM  Result Value Ref Range   Glucose-Capillary 225 (H) 70 - 99 mg/dL    Comment: Glucose reference range applies only to samples taken after fasting for at least 8 hours.  Glucose, capillary     Status: Abnormal   Collection Time: 01/01/24  4:59 PM  Result Value Ref Range   Glucose-Capillary 306 (H) 70 - 99 mg/dL    Comment: Glucose reference range applies only to samples taken after fasting for at least 8 hours.  Glucose, capillary     Status: Abnormal   Collection Time: 01/01/24  7:51 PM  Result Value Ref Range   Glucose-Capillary 56 (L) 70 - 99 mg/dL    Comment: Glucose reference range applies only to samples taken after fasting for at least 8 hours.  Glucose, capillary     Status: None   Collection Time: 01/01/24  8:17 PM  Result Value Ref Range   Glucose-Capillary 79 70 - 99 mg/dL    Comment: Glucose reference range applies only to samples taken after fasting for at least 8 hours.  Glucose, capillary     Status: Abnormal   Collection Time: 01/01/24 10:16 PM  Result Value Ref Range   Glucose-Capillary 206 (H) 70 - 99 mg/dL    Comment: Glucose reference range applies only to samples taken after fasting for at least 8 hours.  Glucose, capillary     Status: None   Collection Time: 01/02/24  5:43 AM  Result Value Ref Range   Glucose-Capillary 94 70 - 99 mg/dL    Comment: Glucose reference range applies only to samples taken after fasting for at least 8 hours.  Glucose, capillary     Status: Abnormal   Collection Time: 01/02/24 11:43 AM  Result Value Ref Range  Glucose-Capillary 252 (H) 70 - 99 mg/dL    Comment: Glucose reference range applies only to samples taken after fasting for at least 8 hours.    Blood Alcohol  level:  Lab Results  Component Value Date   ETH <10 12/23/2023   ETH <10 12/12/2023    Metabolic Disorder Labs: Lab Results  Component Value Date   HGBA1C 11.4 (H) 10/23/2023   MPG 280.48 10/23/2023   MPG >398 07/28/2020   Lab Results  Component Value Date   PROLACTIN 18.7 12/27/2023   Lab Results  Component Value Date   CHOL 200 12/27/2023   TRIG 194 (H) 12/27/2023   HDL 84 12/27/2023   CHOLHDL 2.4 12/27/2023   VLDL 39 12/27/2023   LDLCALC 77 12/27/2023   LDLCALC 125 (H) 03/03/2022     Musculoskeletal: Strength & Muscle Tone: within normal limits Gait & Station: normal Patient leans: N/A  Psychiatric Specialty Exam:  Presentation  General Appearance:  Appropriate for Environment  Eye Contact: Good  Speech: Pressured  Speech Volume: Increased  Handedness: Right   Mood and Affect  Mood: Euphoric  Affect: Inappropriate   Thought Process  Thought Processes: Linear  Descriptions of Associations:Loose  Orientation:Full (Time, Place and Person)  Thought Content:Illogical; Delusions  History of Schizophrenia/Schizoaffective disorder:Yes  Duration of Psychotic Symptoms:Greater than six months  Hallucinations:Hallucinations: None   Ideas of Reference:None  Suicidal Thoughts:Suicidal Thoughts: No   Homicidal Thoughts:Homicidal Thoughts: No    Sensorium  Memory: Immediate Good; Recent Good  Judgment: Poor  Insight: Poor   Executive Functions  Concentration: Good  Attention Span: Good  Recall: Good  Fund of Knowledge: Good  Language: Good   Psychomotor Activity  Psychomotor Activity: Psychomotor Activity: Normal    Assets  Assets: Communication Skills; Social Support; Leisure Time   Sleep  Sleep: Sleep: Fair   Physical Exam: Physical Exam Vitals and nursing note reviewed.  Constitutional:      General: He is not in acute distress.    Appearance: Normal appearance. He is normal weight. He is  not ill-appearing or toxic-appearing.  HENT:     Head: Normocephalic and atraumatic.  Pulmonary:     Effort: Pulmonary effort is normal.  Neurological:     Mental Status: He is alert.    Review of Systems  Respiratory:  Negative for cough and shortness of breath.   Cardiovascular:  Negative for chest pain.  Gastrointestinal:  Negative for abdominal pain, constipation, diarrhea, nausea and vomiting.  Neurological:  Negative for dizziness, weakness and headaches.  Psychiatric/Behavioral:  Negative for depression, hallucinations and suicidal ideas. The patient is not nervous/anxious.    Blood pressure (!) 119/91, pulse 76, temperature 97.9 F (36.6 C), temperature source Oral, resp. rate 16, height 5\' 9"  (1.753 m), weight 58.5 kg, SpO2 100%. Body mass index is 19.05 kg/m.   Treatment Plan Summary: Daily contact with patient to assess and evaluate symptoms and progress in treatment and Medication management  Rafferty Ferber is a 33 yr old male who presented to [name] on 2/21 in DKA, he was admitted and treated and then admitted to [facility] on 2/25.  PPHx is significant for Schizoaffective Disorder, Bipolar Type and Medication Non-Compliance, 1 Suicide Attempt (2019), and Multiple Psychiatric Hospitalizations [facility], old [place], possibly [employer], [employer] and [facility] [place] "their salad bar slaps").    Schizoaffective Disorder, Bipolar Type: -Increase Abilify to 10 mg for psychosis and mood stability -Continue Agitation Protocol: Haldol/Ativan/Benadryl   T1DM Reviewed on 01/02/24 --  FSG 70 at 5:45 AM > 94> 249 -Continue SSI - Novolog 4U TID w/ meals - Novolog HS coverage -Decreased Lantus 24 to 22 units in evening -Restrict to one tray per meal and no snacks   Nicotine Dependence: -Continue Nicotine Patch 14 mg daily -Continue PRN's: Tylenol, Maalox, Atarax, Milk of Magnesia, Trazodone --  The risks/benefits/side-effects/alternatives to this medication  were discussed in detail with the patient and time was given for questions. The patient consents to medication trial.              -- Metabolic profile and EKG monitoring obtained while on an atypical antipsychotic (BMI: Lipid Panel: HbgA1c: QTc:)              -- Encouraged patient to participate in unit milieu and in scheduled group therapies          Safety and Monitoring:             -- Involuntary admission to inpatient psychiatric unit for safety, stabilization and treatment             -- Daily contact with patient to assess and evaluate symptoms and progress in treatment             -- Patient's case to be discussed in multi-disciplinary team meeting             -- Observation Level : q15 minute checks             -- Vital signs:  q12 hours             -- Precautions: suicide, elopement, and assault  Discharge Planning:              -- Social work and case management to assist with discharge planning and identification of hospital follow-up needs prior to discharge             -- Estimated LOS: Estimated discharge Friday, March 7             -- Discharge Concerns: Need to establish a safety plan; Medication compliance and effectiveness             -- Discharge Goals: Return home with outpatient referrals for mental health follow-up including medication management/psychotherapy  Signed: [name], MD 01/02/2024, 1:10 PM

## 2024-01-03 ENCOUNTER — Telehealth: Payer: Self-pay | Admitting: *Deleted

## 2024-01-03 ENCOUNTER — Other Ambulatory Visit (HOSPITAL_COMMUNITY): Payer: Self-pay

## 2024-01-03 ENCOUNTER — Encounter (HOSPITAL_COMMUNITY): Payer: Self-pay

## 2024-01-03 DIAGNOSIS — E559 Vitamin D deficiency, unspecified: Secondary | ICD-10-CM

## 2024-01-03 HISTORY — DX: Vitamin D deficiency, unspecified: E55.9

## 2024-01-03 LAB — GLUCOSE, CAPILLARY
Glucose-Capillary: 226 mg/dL — ABNORMAL HIGH (ref 70–99)
Glucose-Capillary: 361 mg/dL — ABNORMAL HIGH (ref 70–99)
Glucose-Capillary: 220 mg/dL — ABNORMAL HIGH (ref 70–99)
Glucose-Capillary: 138 mg/dL — ABNORMAL HIGH (ref 70–99)

## 2024-01-03 LAB — RPR: RPR Ser Ql: NONREACTIVE

## 2024-01-03 MED ORDER — GLUCERNA SHAKE PO LIQD
237.0000 mL | Freq: Three times a day (TID) | ORAL | Status: DC
  Administered 2024-01-03 – 2024-01-05 (×5): 237 mL via ORAL
  Filled 2024-01-03 (×13): qty 237

## 2024-01-03 MED ORDER — VITAMIN D (ERGOCALCIFEROL) 1.25 MG (50000 UNIT) PO CAPS
50000.0000 [IU] | ORAL_CAPSULE | ORAL | Status: DC
  Administered 2024-01-03: 50000 [IU] via ORAL
  Filled 2024-01-03: qty 1

## 2024-01-03 MED ORDER — INSULIN GLARGINE 100 UNIT/ML ~~LOC~~ SOLN
25.0000 [IU] | Freq: Once | SUBCUTANEOUS | Status: AC
  Administered 2024-01-03: 25 [IU] via SUBCUTANEOUS
  Filled 2024-01-03: qty 0.25

## 2024-01-03 MED ORDER — INSULIN ASPART 100 UNIT/ML IJ SOLN
5.0000 [IU] | Freq: Three times a day (TID) | INTRAMUSCULAR | Status: DC
Start: 2024-01-03 — End: 2024-01-03
  Administered 2024-01-03: 5 [IU] via SUBCUTANEOUS

## 2024-01-03 MED ORDER — INSULIN ASPART 100 UNIT/ML IJ SOLN
6.0000 [IU] | Freq: Three times a day (TID) | INTRAMUSCULAR | Status: DC
  Administered 2024-01-03 – 2024-01-05 (×6): 6 [IU] via SUBCUTANEOUS

## 2024-01-03 MED ORDER — INSULIN GLARGINE 100 UNIT/ML ~~LOC~~ SOLN
25.0000 [IU] | Freq: Every morning | SUBCUTANEOUS | Status: DC
  Administered 2024-01-04: 25 [IU] via SUBCUTANEOUS
  Filled 2024-01-03 (×2): qty 0.25

## 2024-01-03 NOTE — [facility] Group Notes (Signed)
Pt did not attend wrap-up group   

## 2024-01-03 NOTE — Progress Notes (Signed)
 [facility] MD Progress Note  01/03/2024 11:32 AM Jacob [name]  MRN:  [id number] Subjective:   Jacob [name] is a 33 yr old male who presented to [facility] on 2/21 in DKA, he was admitted and treated and then admitted to [facility] on 2/25.  PPHx is significant for Schizoaffective Disorder, Bipolar Type and Medication Non-Compliance, 1 Suicide Attempt (2019), and Multiple Psychiatric Hospitalizations [facility], old [place], possibly [employer], [employer] and [facility] "their salad bar slaps").   The patient's chart was reviewed and nursing notes were reviewed. The patient's case was discussed in multidisciplinary team meeting.  Per nurses report, he has been irritable regarding not getting enough snacks, and refused Abilify this morning.  The patient was seen in his room during rounds.  He was irritable regarding not getting enough to eat.  As we discussed yesterday, will start Glucerna between meals and make insulin adjustments to accommodate more snacks.  After talking with the patient, he was amenable to taking his insulin and Abilify which she did while I watched.  He tells me that his home insulin regimen is 30 mg of long acting insulin and 7 units 3 times a day with meals.  He is aware that he is getting less insulin in the hospital as the diet is carb controlled.  The PA student participating in the patient care has reached out to the diabetes educator to provide additional education for the patient.  Patient continues to present as irritable/hypomanic.  He appears mildly disorganized in thought, but is mostly linear in conversation.  He has an interview with [place] today.  Staff did mention that [facility] has an issue with sliding scale insulin; however, the patient's home dosing of insulin is 3 times a day with meals as well as long-acting, so this should not be an issue.  Patient's vitamin D is deficient and we discussed replacing.  Principal Problem: Schizoaffective disorder (HCC) Diagnosis:  Principal Problem:   Schizoaffective disorder (HCC) Active Problems:   Uncontrolled type 1 diabetes mellitus with hyperglycemia, with long-term current use of insulin (HCC)  Total Time spent with patient:  I personally spent 35 minutes on the unit in direct patient care. The direct patient care time included face-to-face time with the patient, reviewing the patient's chart, communicating with other professionals, and coordinating care. Greater than 50% of this time was spent in counseling or coordinating care with the patient regarding goals of hospitalization, psycho-education, and discharge planning needs.   Past Psychiatric History:  Schizoaffective Disorder, Bipolar Type and Medication Non-Compliance, 1 Suicide Attempt (2019), and Multiple Psychiatric Hospitalizations [facility], old [place], possibly [employer], [employer] and [facility] "their salad bar slaps").  Past Medical History:  Past Medical History:  Diagnosis Date   Bipolar 1 disorder (HCC)    History of attempted suicide 2019   Unsuccessful suicide attempt in 2019 with rat poison   Schizoaffective disorder (HCC)    Type 1 diabetes mellitus on insulin therapy (HCC) 2019   Vitamin D deficiency 01/03/2024   History reviewed. No pertinent surgical history. Family History:  Family History  Problem Relation Age of Onset   Healthy Mother    Healthy Father    Family Psychiatric  History:  Mother- Crack Cocaine Abuse "Don't Really Know"  Social History:  Social History   Substance and Sexual Activity  Alcohol Use Yes   Comment: social/occassional     Social History   Substance and Sexual Activity  Drug Use Not Currently  Types: Marijuana    Social History   Socioeconomic History   Marital status: Single    Spouse name: Not on file   Number of children: Not on file   Years of education: Not on file   Highest education level: Not on file  Occupational History   Not on file   Tobacco Use   Smoking status: Former    Types: Cigars, Cigarettes   Smokeless tobacco: Never   Tobacco comments:    2 BLACK AND MILD A DAY  Vaping Use   Vaping status: Every Day  Substance and Sexual Activity   Alcohol use: Yes    Comment: social/occassional   Drug use: Not Currently    Types: Marijuana   Sexual activity: Not on file  Other Topics Concern   Not on file  Social History Narrative   Not on file   Social Drivers of Health   Financial Resource Strain: Not on file  Food Insecurity: Food Insecurity Present (12/26/2023)   Hunger Vital Sign    Worried About Running Out of Food in the Last Year: Sometimes true    Ran Out of Food in the Last Year: Sometimes true  Transportation Needs: No Transportation Needs (12/26/2023)   PRAPARE - Administrator, Civil Service (Medical): No    Lack of Transportation (Non-Medical): No  Physical Activity: Not on file  Stress: Not on file  Social Connections: Socially Isolated (12/26/2023)   Social Connection and Isolation Panel [NHANES]    Frequency of Communication with Friends and Family: Never    Frequency of Social Gatherings with Friends and Family: Never    Attends Religious Services: Never    Database administrator or Organizations: No    Attends Engineer, structural: Never    Marital Status: Never married    Current Medications: Current Facility-Administered Medications  Medication Dose Route Frequency Provider Last Rate Last Admin   acetaminophen (TYLENOL) tablet 650 mg  650 mg Oral Q6H PRN Margaretmary Dys, MD   650 mg at 12/31/23 0048   alum & mag hydroxide-simeth (MAALOX/MYLANTA) 200-200-20 MG/5ML suspension 30 mL  30 mL Oral Q4H PRN Margaretmary Dys, MD       ARIPiprazole (ABILIFY) tablet 10 mg  10 mg Oral Daily [name], MD   10 mg at 01/03/24 0815   haloperidol (HALDOL) tablet 5 mg  5 mg Oral TID PRN Margaretmary Dys, MD       And    diphenhydrAMINE (BENADRYL) capsule 50 mg  50 mg Oral TID PRN Margaretmary Dys, MD       haloperidol lactate (HALDOL) injection 5 mg  5 mg Intramuscular TID PRN Margaretmary Dys, MD   5 mg at 12/27/23 2125   And   diphenhydrAMINE (BENADRYL) injection 50 mg  50 mg Intramuscular TID PRN Margaretmary Dys, MD   50 mg at 12/27/23 2126   And   LORazepam (ATIVAN) injection 2 mg  2 mg Intramuscular TID PRN Margaretmary Dys, MD   2 mg at 12/27/23 2126   haloperidol lactate (HALDOL) injection 10 mg  10 mg Intramuscular TID PRN Margaretmary Dys, MD       And   diphenhydrAMINE (BENADRYL) injection 50 mg  50 mg Intramuscular TID PRN Margaretmary Dys, MD       And   LORazepam (ATIVAN) injection 2 mg  2 mg Intramuscular TID PRN Weston Settle,  Byrd Hesselbach, MD       feeding supplement (GLUCERNA SHAKE) (GLUCERNA SHAKE) liquid 237 mL  237 mL Oral TID BM [name], MD   237 mL at 01/03/24 1012   hydrOXYzine (ATARAX) tablet 10 mg  10 mg Oral TID PRN Margaretmary Dys, MD   10 mg at 01/02/24 2102   insulin aspart (novoLOG) injection 0-5 Units  0-5 Units Subcutaneous QHS Onuoha, Chinwendu V, NP   2 Units at 01/01/24 2223   insulin aspart (novoLOG) injection 0-9 Units  0-9 Units Subcutaneous TID WC Onuoha, Chinwendu V, NP   2 Units at 01/03/24 5409   insulin aspart (novoLOG) injection 5 Units  5 Units Subcutaneous TID with meals [name], MD       [START ON 01/04/2024] insulin glargine (LANTUS) injection 25 Units  25 Units Subcutaneous q AM [name], MD       magnesium hydroxide (MILK OF MAGNESIA) suspension 30 mL  30 mL Oral Daily PRN Margaretmary Dys, MD       nicotine (NICODERM CQ - dosed in mg/24 hours) patch 14 mg  14 mg Transdermal Q0600 Margaretmary Dys, MD       traZODone (DESYREL) tablet 50 mg  50 mg Oral QHS PRN Margaretmary Dys, MD   50 mg at  01/02/24 2102   Vitamin D (Ergocalciferol) (DRISDOL) 1.25 MG (50000 UNIT) capsule 50,000 Units  50,000 Units Oral Q7 days [name], MD        Lab Results:  Results for orders placed or performed during the hospital encounter of 12/26/23 (from the past 48 hours)  Glucose, capillary     Status: Abnormal   Collection Time: 01/01/24 12:04 PM  Result Value Ref Range   Glucose-Capillary 225 (H) 70 - 99 mg/dL    Comment: Glucose reference range applies only to samples taken after fasting for at least 8 hours.  Glucose, capillary     Status: Abnormal   Collection Time: 01/01/24  4:59 PM  Result Value Ref Range   Glucose-Capillary 306 (H) 70 - 99 mg/dL    Comment: Glucose reference range applies only to samples taken after fasting for at least 8 hours.  Glucose, capillary     Status: Abnormal   Collection Time: 01/01/24  7:51 PM  Result Value Ref Range   Glucose-Capillary 56 (L) 70 - 99 mg/dL    Comment: Glucose reference range applies only to samples taken after fasting for at least 8 hours.  Glucose, capillary     Status: None   Collection Time: 01/01/24  8:17 PM  Result Value Ref Range   Glucose-Capillary 79 70 - 99 mg/dL    Comment: Glucose reference range applies only to samples taken after fasting for at least 8 hours.  Glucose, capillary     Status: Abnormal   Collection Time: 01/01/24 10:16 PM  Result Value Ref Range   Glucose-Capillary 206 (H) 70 - 99 mg/dL    Comment: Glucose reference range applies only to samples taken after fasting for at least 8 hours.  Glucose, capillary     Status: None   Collection Time: 01/02/24  5:43 AM  Result Value Ref Range   Glucose-Capillary 94 70 - 99 mg/dL    Comment: Glucose reference range applies only to samples taken after fasting for at least 8 hours.  Glucose, capillary     Status: Abnormal   Collection Time: 01/02/24 11:43 AM  Result  Value Ref Range   Glucose-Capillary 252 (H) 70 - 99 mg/dL    Comment: Glucose reference range  applies only to samples taken after fasting for at least 8 hours.  Glucose, capillary     Status: Abnormal   Collection Time: 01/02/24  5:02 PM  Result Value Ref Range   Glucose-Capillary 235 (H) 70 - 99 mg/dL    Comment: Glucose reference range applies only to samples taken after fasting for at least 8 hours.  Folate     Status: None   Collection Time: 01/02/24  6:37 PM  Result Value Ref Range   Folate 9.4 >5.9 ng/mL    Comment: Performed at [facility], [address], [place], [place] [address]  RPR     Status: None   Collection Time: 01/02/24  6:37 PM  Result Value Ref Range   RPR Ser Ql NON REACTIVE NON REACTIVE    Comment: Performed at [facility], [address], [place], [place] [address]  Vitamin B12     Status: None   Collection Time: 01/02/24  6:37 PM  Result Value Ref Range   Vitamin B-12 800 180 - 914 pg/mL    Comment: (NOTE) This assay is not validated for testing neonatal or myeloproliferative syndrome specimens for Vitamin B12 levels. Performed at [facility], [address], [place], [place] [address]   VITAMIN D 25 Hydroxy (Vit-D Deficiency, Fractures)     Status: Abnormal   Collection Time: 01/02/24  6:37 PM  Result Value Ref Range   Vit D, 25-Hydroxy 18.55 (L) 30 - 100 ng/mL    Comment: (NOTE) Vitamin D deficiency has been defined by the Institute of Medicine  and an Endocrine Society practice guideline as a level of serum 25-OH  vitamin D less than 20 ng/mL (1,2). The Endocrine Society went on to  further define vitamin D insufficiency as a level between 21 and 29  ng/mL (2).  1. IOM (Institute of Medicine). 2010. Dietary reference intakes for  calcium and D. [place]: [employer]. 2. Holick MF, Binkley [place], Bischoff-Ferrari HA, et al. Evaluation,  treatment, and prevention of vitamin D deficiency: an Endocrine  Society clinical practice guideline, JCEM. 2011 Jul; 96(7):  1911-30.  Performed at [facility], [address], [place], [place] [address]   Glucose, capillary     Status: None   Collection Time: 01/02/24  7:31 PM  Result Value Ref Range   Glucose-Capillary 98 70 - 99 mg/dL    Comment: Glucose reference range applies only to samples taken after fasting for at least 8 hours.  Glucose, capillary     Status: Abnormal   Collection Time: 01/03/24  6:04 AM  Result Value Ref Range   Glucose-Capillary 226 (H) 70 - 99 mg/dL    Comment: Glucose reference range applies only to samples taken after fasting for at least 8 hours.    Blood Alcohol level:  Lab Results  Component Value Date   ETH <10 12/23/2023   ETH <10 12/12/2023    Metabolic Disorder Labs: Lab Results  Component Value Date   HGBA1C 11.4 (H) 10/23/2023   MPG 280.48 10/23/2023   MPG >398 07/28/2020   Lab Results  Component Value Date   PROLACTIN 18.7 12/27/2023   Lab Results  Component Value Date   CHOL 200 12/27/2023   TRIG 194 (H) 12/27/2023   HDL 84 12/27/2023   CHOLHDL 2.4 12/27/2023   VLDL 39 12/27/2023  LDLCALC 77 12/27/2023   LDLCALC 125 (H) 03/03/2022     Musculoskeletal: Strength & Muscle Tone: within normal limits Gait & Station: normal Patient leans: N/A  Psychiatric Specialty Exam:  Presentation  General Appearance:  Appropriate for Environment  Eye Contact: Good  Speech: Pressured  Speech Volume: Increased  Handedness: Right   Mood and Affect  Mood: Euphoric  Affect: Inappropriate   Thought Process  Thought Processes: Linear  Descriptions of Associations:Loose  Orientation:Full (Time, Place and Person)  Thought Content:Illogical; Delusions  History of Schizophrenia/Schizoaffective disorder:Yes  Duration of Psychotic Symptoms:Greater than six months  Hallucinations:Hallucinations: None   Ideas of Reference:None  Suicidal Thoughts:Suicidal Thoughts: No   Homicidal Thoughts:Homicidal Thoughts:  No    Sensorium  Memory: Immediate Good; Recent Good  Judgment: Poor  Insight: Poor   Executive Functions  Concentration: Good  Attention Span: Good  Recall: Good  Fund of Knowledge: Good  Language: Good   Psychomotor Activity  Psychomotor Activity: Psychomotor Activity: Normal    Assets  Assets: Communication Skills; Social Support; Leisure Time   Sleep  Sleep: Sleep: Fair   Physical Exam: Physical Exam Vitals and nursing note reviewed.  Constitutional:      General: He is not in acute distress.    Appearance: Normal appearance. He is normal weight. He is not ill-appearing or toxic-appearing.  HENT:     Head: Normocephalic and atraumatic.  Pulmonary:     Effort: Pulmonary effort is normal.  Neurological:     Mental Status: He is alert.    Review of Systems  Respiratory:  Negative for cough and shortness of breath.   Cardiovascular:  Negative for chest pain.  Gastrointestinal:  Negative for abdominal pain, constipation, diarrhea, nausea and vomiting.  Neurological:  Negative for dizziness, weakness and headaches.  Psychiatric/Behavioral:  Negative for depression, hallucinations and suicidal ideas. The patient is not nervous/anxious.    Blood pressure 115/85, pulse 71, temperature 98 F (36.7 C), temperature source Oral, resp. rate 16, height 5\' 9"  (1.753 m), weight 58.5 kg, SpO2 100%. Body mass index is 19.05 kg/m.   Treatment Plan Summary: Daily contact with patient to assess and evaluate symptoms and progress in treatment and Medication management  Jacob [name] is a 32 yr old male who presented to [facility] on 2/21 in DKA, he was admitted and treated and then admitted to [facility] on 2/25.  PPHx is significant for Schizoaffective Disorder, Bipolar Type and Medication Non-Compliance, 1 Suicide Attempt (2019), and Multiple Psychiatric Hospitalizations [facility], old [place], possibly [employer], [employer] and [facility]  "their salad bar slaps").    Schizoaffective Disorder, Bipolar Type: -Increase Abilify to 10 mg for psychosis and mood stability -Continue Agitation Protocol: Haldol/Ativan/Benadryl  Vitamin D deficiency (18.55) -Ergocalciferol 50,000 unit weekly  T1DM Reviewed on 01/03/24 --glucose range over the last 24 hours has been 98-252 -Continue SSI -Increase Novolog from 4U TID w/ meals to 5 units 3 times daily with meals - Novolog HS coverage -Increase Lantus to 25 units and change from lunchtime to every morning (home Lantus is 30 units every morning)    Nicotine Dependence: -Continue Nicotine Patch 14 mg daily -Continue PRN's: Tylenol, Maalox, Atarax, Milk of Magnesia, Trazodone --  The risks/benefits/side-effects/alternatives to this medication were discussed in detail with the patient and time was given for questions. The patient consents to medication trial.              -- Metabolic profile and EKG monitoring obtained while on an atypical  antipsychotic (BMI: Lipid Panel: HbgA1c: QTc:)              -- Encouraged patient to participate in unit milieu and in scheduled group therapies          Safety and Monitoring:             -- Involuntary admission to inpatient psychiatric unit for safety, stabilization and treatment             -- Daily contact with patient to assess and evaluate symptoms and progress in treatment             -- Patient's case to be discussed in multi-disciplinary team meeting             -- Observation Level : q15 minute checks             -- Vital signs:  q12 hours             -- Precautions: suicide, elopement, and assault  Discharge Planning:              -- Social work and case management to assist with discharge planning and identification of hospital follow-up needs prior to discharge             -- Estimated LOS: Estimated discharge Friday, March 7             -- Discharge Concerns: Need to establish a safety plan; Medication compliance and  effectiveness             -- Discharge Goals: Return home with outpatient referrals for mental health follow-up including medication management/psychotherapy  Signed: [name], MD 01/03/2024, 11:32 AM

## 2024-01-03 NOTE — Group Note (Unsigned)
 Date:  01/03/2024 Time:  10:13 AM  Group Topic/Focus:  Goals Group:   The focus of this group is to help patients establish daily goals to achieve during treatment and discuss how the patient can incorporate goal setting into their daily lives to aide in recovery.     Participation Level:  {BHH PARTICIPATION RUEAV:40981}  Participation Quality:  {BHH PARTICIPATION QUALITY:22265}  Affect:  {BHH AFFECT:22266}  Cognitive:  {BHH COGNITIVE:22267}  Insight: {BHH Insight2:20797}  Engagement in Group:  {BHH ENGAGEMENT IN XBJYN:82956}  Modes of Intervention:  {BHH MODES OF INTERVENTION:22269}  Additional Comments:  ***  Gwenevere Abbot Ramsay 01/03/2024, 10:13 AM

## 2024-01-03 NOTE — Progress Notes (Signed)
 Patient was asleep most of the shift due to previous shifts PRN's. Blood sugar was taken, 118. No insulin required. Patient denies SI/HI,AVH. Will continue Q 15  01/03/24 2200  Psych Admission Type (Psych Patients Only)  Admission Status Voluntary  Psychosocial Assessment  Patient Complaints Agitation;Anger (Patient asleep most of the shift)  Eye Contact Fair  Facial Expression Other (Comment) (sleeping)  Affect Irritable  Speech UTA  Interaction Other (Comment) (sleeping)  Motor Activity Other (Comment) (sleeping)  Appearance/Hygiene Unremarkable  Behavior Characteristics Appropriate to situation  Mood Other (Comment) (sleeping)  Thought Process  Coherency WDL  Content WDL  Delusions None reported or observed  Perception WDL  Hallucination None reported or observed  Judgment Impaired  Confusion None  Danger to Self  Current suicidal ideation? Denies  Danger to Others  Danger to Others None reported or observed     01/03/24 2200  Psych Admission Type (Psych Patients Only)  Admission Status Voluntary  Psychosocial Assessment  Patient Complaints Agitation;Anger (Patient asleep most of the shift)  Eye Contact Fair  Facial Expression Other (Comment) (sleeping)  Affect Irritable  Speech UTA  Interaction Other (Comment) (sleeping)  Motor Activity Other (Comment) (sleeping)  Appearance/Hygiene Unremarkable  Behavior Characteristics Appropriate to situation  Mood Other (Comment) (sleeping)  Thought Process  Coherency WDL  Content WDL  Delusions None reported or observed  Perception WDL  Hallucination None reported or observed  Judgment Impaired  Confusion None  Danger to Self  Current suicidal ideation? Denies  Danger to Others  Danger to Others None reported or observed    min checks.

## 2024-01-03 NOTE — Progress Notes (Signed)
 Pt became hostile and physically aggressive towards staff during dinner. He grabbed staff left wrist and pushed her away aggressively from tray cart while attempting to redirect him from not taking food till nursing staff view his tray due to diabetic diet restriction. Observed being very boisterous yelling, cursing, charging at staff with fingers in staff faces "I'm malnourished, feed me you fucking bitch. I will take 2 fucking trays, do what the fuck you gonna do bitch". Went to his room and slammed the door in an aggressive manner "get the fuck off me bitch". Verbally redirections ineffective at the time.

## 2024-01-03 NOTE — Plan of Care (Signed)
   Problem: Education: Goal: Emotional status will improve Outcome: Not Progressing Goal: Mental status will improve Outcome: Not Progressing Goal: Verbalization of understanding the information provided will improve Outcome: Not Progressing

## 2024-01-03 NOTE — Progress Notes (Signed)
 PRN IM medication administered for continued agitation, hostility, and physical aggression per MD order without need for manual hold or other restrictive intervention.

## 2024-01-03 NOTE — Group Note (Signed)
 Date:  01/03/2024 Time:  2:12 PM  Group Topic/Focus:  Goals Group:   The focus of this group is to help patients establish daily goals to achieve during treatment and discuss how the patient can incorporate goal setting into their daily lives to aide in recovery.    Participation Level:  Active  Participation Quality:  Attentive  Affect:  Appropriate  Cognitive:  Appropriate  Insight: Appropriate  Engagement in Group:  Engaged  Modes of Intervention:  Discussion  Additional Comments:    Jacob [name] 01/03/2024, 2:12 PM

## 2024-01-03 NOTE — Progress Notes (Signed)
 Patient ID: Jacob [name], male   DOB: Nov 15, 1990, 34 y.o.   MRN: [id number] Patient observed to be belligerent, slamming doors, punching on the walls, yelling and cussing at staff during dinner when staff attempted to explain to him that he needed to make healthy choices, during mealtimes and avoid foods that are high in concentrated sweet in order to better control his blood glucose levels.  Attempts at verbally de-escalating patient failed, as he became physically aggressive towards staff, grabbed on the nurse Tech's hand, in order to get extra food trays.  Patient took the trays to his room, and ate the extra food, even after being educated that his blood glucose levels need to be better controlled with oral antiglycemic medications in order for him to be admitted into [facility] rehabilitation.  Patient became so angry that he was yelling multiple profanities at staff including "I will eat whatever the fuck I want Bitch. You can't do nothing Bitch!" He stated this while in extremely close proximity to staff's face, & invading their personal space.  Patient threatened not to take any medications if offered to him to help him de-escalate. Attempts at Verbal de-escalation failed as his behaviors continued to escalate.  Due to the imminent risk of danger to staff and other patients on the unit, writer ordered staff to administer IM agitation protocol medications consisting of Benadryl 50 mg, Haldol 5 mg, and Ativan 2 mg IM to be administered to patient.  Medications were administered at 1800.  Throughout the patient's behavioral episodes, there were no overt signs of psychosis.  Patient was linear in his responses to questions, with no signs or symptoms of psychosis noted. Episodes were behavioral in nature with no psychosis noted.

## 2024-01-03 NOTE — Progress Notes (Signed)
 Collateral contact - Marcelino Duster from [facility] (treatment facility) [phone]  Marcelino Duster said that she reviewed patient's paperwork and requested updated medical documents. She noted that his blood sugar needs to be better controlled and asked for a document showing blood sugar levels under 150.  She also inquired if patient is on a sliding scale for diabetes.  If these documents are provided, he can be admitted on Thursday (3/6) or Friday (3/7).   Collateral contact - [facility] - (treatment facility) - phone:  [phone]  Patient had a phone interview, and informed CSW that he had to go to the bathroom, and another patient hung up the phone.    CSW contacted the treatment facility, who stated that the phone call was disconnected and advised patient to call again. CSW relayed this information to patient, who said he needs to discuss the situation with his mom before calling the facility back.   Mataio Mele, LCSWA 01/03/2024

## 2024-01-03 NOTE — Telephone Encounter (Signed)
 Call from patient's grandmother asking about the medications that patient should be taking.  Patient is currently in patient at [facility].  Grandmother was informed that [facility] has access to all of the patient's medication lists.  She will not need to get his medications when and if he is transferred to another Facility.

## 2024-01-04 LAB — GLUCOSE, CAPILLARY
Glucose-Capillary: 47 mg/dL — ABNORMAL LOW (ref 70–99)
Glucose-Capillary: 114 mg/dL — ABNORMAL HIGH (ref 70–99)
Glucose-Capillary: 255 mg/dL — ABNORMAL HIGH (ref 70–99)
Glucose-Capillary: 269 mg/dL — ABNORMAL HIGH (ref 70–99)
Glucose-Capillary: 186 mg/dL — ABNORMAL HIGH (ref 70–99)
Glucose-Capillary: 303 mg/dL — ABNORMAL HIGH (ref 70–99)
Glucose-Capillary: 179 mg/dL — ABNORMAL HIGH (ref 70–99)
Glucose-Capillary: 220 mg/dL — ABNORMAL HIGH (ref 70–99)

## 2024-01-04 MED ORDER — INSULIN GLARGINE 100 UNIT/ML ~~LOC~~ SOLN
22.0000 [IU] | Freq: Every morning | SUBCUTANEOUS | Status: DC
  Administered 2024-01-05: 22 [IU] via SUBCUTANEOUS
  Filled 2024-01-04 (×3): qty 0.22

## 2024-01-04 MED ORDER — ARIPIPRAZOLE 15 MG PO TABS
15.0000 mg | ORAL_TABLET | Freq: Every day | ORAL | Status: DC
  Administered 2024-01-05: 15 mg via ORAL
  Filled 2024-01-04 (×3): qty 1

## 2024-01-04 NOTE — Progress Notes (Signed)
   01/04/24 2100  Psych Admission Type (Psych Patients Only)  Admission Status Voluntary  Psychosocial Assessment  Patient Complaints None  Eye Contact Fair  Facial Expression Animated  Affect Preoccupied  Clinical cytogeneticist Activity Fidgety  Appearance/Hygiene Unremarkable  Behavior Characteristics Cooperative;Appropriate to situation  Mood Preoccupied  Thought Process  Coherency Circumstantial  Content WDL  Delusions None reported or observed  Perception WDL  Hallucination None reported or observed  Judgment Impaired  Confusion None  Danger to Self  Current suicidal ideation? Denies  Danger to Others  Danger to Others None reported or observed

## 2024-01-04 NOTE — Progress Notes (Signed)
 Pt denies SI/HI/AV. Pt has been calm and cooperative. Slept most of the shift. Pt was offered support and encouragement. Pt was given scheduled medications. Pt was encourage to attend groups. Q 15 minute checks were done for safety. Pt receptive to treatment and safety maintained on unit.

## 2024-01-04 NOTE — Group Note (Unsigned)
 Date:  01/05/2024 Time:  1:09 AM  Group Topic/Focus:  Wrap-Up Group:   The focus of this group is to help patients review their daily goal of treatment and discuss progress on daily workbooks.    Participation Level:  Active  Participation Quality:  Appropriate and Sharing  Affect:  Appropriate  Cognitive:  Appropriate  Insight: Appropriate  Engagement in Group:  Engaged  Modes of Intervention:  Socialization  Additional Comments:  Patient shared that he had a "pretty good day". Patient rated his day a 8 or 9 out of 10. Patient shared his goal for today was "Self control" and shared that he spoke with a nurse regarding how to have self control. Patient shared that he "napped, has gotten better sleep and eating". Patient shared her goal for tomorrow, "prepare for discharge hopefully". Patient is not having any pain.   Jacob [name] 01/05/2024, 1:09 AM

## 2024-01-04 NOTE — Plan of Care (Signed)
 Pt presents with animated expression and preoccupied affect. Denies SI, HI, AVH, and pain. Pt is assertive, intrusive, and attention seeking in interactions with staff; remains able to be verbally redirected. Pt is restless and was observed pacing the milieu throughout the shift. Pt is pleasant and cooperative but remains labile. Medication compliant with no adverse reactions. Safety checks maintained at q 15 minutes. Support, encouragement, and reassurance offered to the pt.   Problem: Education: Goal: Emotional status will improve Outcome: Progressing Goal: Mental status will improve Outcome: Progressing Goal: Verbalization of understanding the information provided will improve Outcome: Progressing   Problem: Activity: Goal: Interest or engagement in activities will improve Outcome: Progressing   Problem: Safety: Goal: Periods of time without injury will increase Outcome: Progressing

## 2024-01-04 NOTE — Progress Notes (Signed)
 [facility] MD Progress Note  01/04/2024 12:13 PM Jacob [name]  MRN:  [id number] Subjective:   Jacob [name] is a 33 yr old male who presented to [facility] on 2/21 in DKA, he was admitted and treated and then admitted to [facility] on 2/25.  PPHx is significant for Schizoaffective Disorder, Bipolar Type and Medication Non-Compliance, 1 Suicide Attempt (2019), and Multiple Psychiatric Hospitalizations [facility], [facility], possibly [facility], [employer] and [facility] "their salad bar slaps").   The patient's chart was reviewed and nursing notes were reviewed. The patient's case was discussed in multidisciplinary team meeting.  Per nurses report, he has been irritable regarding not getting enough snacks, still.  He made verbal threats to staff overnight and required as needed medications for agitation.  According to LCSW the patient made poor effort to try to get placement and drug treatment yesterday.  Patient continues to present as irritable regarding food restriction on the unit despite all reasonable efforts being made to make sure he is getting more than adequate calories.  Today the patient tells me that he is no longer interested in pursuing drug treatment.  He is linear in conversation and does not appear dangerous to himself or others.  He expressed a desire to discharge from the hospital, so we will plan to discharge him tomorrow.  His behavioral outburst seems to be more related to personality traits than to acute mania or psychosis as he is neither manic nor psychotic.  We discussed increasing Abilify to 15 mg daily for better management of bipolar disorder.  Principal Problem: Schizoaffective disorder (HCC) Diagnosis: Principal Problem:   Schizoaffective disorder (HCC) Active Problems:   Uncontrolled type 1 diabetes mellitus with hyperglycemia, with long-term current use of insulin (HCC)  Total Time spent with patient:  I personally spent 35 minutes on the unit in direct  patient care. The direct patient care time included face-to-face time with the patient, reviewing the patient's chart, communicating with other professionals, and coordinating care. Greater than 50% of this time was spent in counseling or coordinating care with the patient regarding goals of hospitalization, psycho-education, and discharge planning needs.   Past Psychiatric History:  Schizoaffective Disorder, Bipolar Type and Medication Non-Compliance, 1 Suicide Attempt (2019), and Multiple Psychiatric Hospitalizations [facility], old [facility], possibly [facility], [facility] and [facility] "their salad bar slaps").  Past Medical History:  Past Medical History:  Diagnosis Date   Bipolar 1 disorder (HCC)    History of attempted suicide 2019   Unsuccessful suicide attempt in 2019 with rat poison   Schizoaffective disorder (HCC)    Type 1 diabetes mellitus on insulin therapy (HCC) 2019   Vitamin D deficiency 01/03/2024   History reviewed. No pertinent surgical history. Family History:  Family History  Problem Relation Age of Onset   Healthy Mother    Healthy Father    Family Psychiatric  History:  Mother- Crack Cocaine Abuse "Don't Really Know"  Social History:  Social History   Substance and Sexual Activity  Alcohol Use Yes   Comment: social/occassional     Social History   Substance and Sexual Activity  Drug Use Not Currently   Types: Marijuana    Social History   Socioeconomic History   Marital status: Single    Spouse name: Not on file   Number of children: Not on file   Years of education: Not on file   Highest education level: Not on file  Occupational History  Not on file  Tobacco Use   Smoking status: Former    Types: Cigars, Cigarettes   Smokeless tobacco: Never   Tobacco comments:    2 BLACK AND MILD A DAY  Vaping Use   Vaping status: Every Day  Substance and Sexual Activity   Alcohol use: Yes    Comment:  social/occassional   Drug use: Not Currently    Types: Marijuana   Sexual activity: Not on file  Other Topics Concern   Not on file  Social History Narrative   Not on file   Social Drivers of Health   Financial Resource Strain: Not on file  Food Insecurity: Food Insecurity Present (12/26/2023)   Hunger Vital Sign    Worried About Running Out of Food in the Last Year: Sometimes true    Ran Out of Food in the Last Year: Sometimes true  Transportation Needs: No Transportation Needs (12/26/2023)   PRAPARE - Administrator, Civil Service (Medical): No    Lack of Transportation (Non-Medical): No  Physical Activity: Not on file  Stress: Not on file  Social Connections: Socially Isolated (12/26/2023)   Social Connection and Isolation Panel [NHANES]    Frequency of Communication with Friends and Family: Never    Frequency of Social Gatherings with Friends and Family: Never    Attends Religious Services: Never    Database administrator or Organizations: No    Attends Engineer, structural: Never    Marital Status: Never married    Current Medications: Current Facility-Administered Medications  Medication Dose Route Frequency Provider Last Rate Last Admin   acetaminophen (TYLENOL) tablet 650 mg  650 mg Oral Q6H PRN Margaretmary Dys, MD   650 mg at 12/31/23 0048   alum & mag hydroxide-simeth (MAALOX/MYLANTA) 200-200-20 MG/5ML suspension 30 mL  30 mL Oral Q4H PRN Margaretmary Dys, MD       [START ON 01/05/2024] ARIPiprazole (ABILIFY) tablet 15 mg  15 mg Oral Daily [name], MD       haloperidol (HALDOL) tablet 5 mg  5 mg Oral TID PRN Margaretmary Dys, MD       And   diphenhydrAMINE (BENADRYL) capsule 50 mg  50 mg Oral TID PRN Margaretmary Dys, MD       haloperidol lactate (HALDOL) injection 5 mg  5 mg Intramuscular TID PRN Margaretmary Dys, MD   5 mg at 01/03/24 1800   And   diphenhydrAMINE  (BENADRYL) injection 50 mg  50 mg Intramuscular TID PRN Margaretmary Dys, MD   50 mg at 01/03/24 1800   And   LORazepam (ATIVAN) injection 2 mg  2 mg Intramuscular TID PRN Margaretmary Dys, MD   2 mg at 01/03/24 1758   haloperidol lactate (HALDOL) injection 10 mg  10 mg Intramuscular TID PRN Margaretmary Dys, MD       And   diphenhydrAMINE (BENADRYL) injection 50 mg  50 mg Intramuscular TID PRN Margaretmary Dys, MD       And   LORazepam (ATIVAN) injection 2 mg  2 mg Intramuscular TID PRN Margaretmary Dys, MD       feeding supplement (GLUCERNA SHAKE) (GLUCERNA SHAKE) liquid 237 mL  237 mL Oral TID BM [name], MD   237 mL at 01/04/24 1610   hydrOXYzine (ATARAX) tablet 10 mg  10 mg Oral TID PRN Margaretmary Dys,  MD   10 mg at 01/02/24 2102   insulin aspart (novoLOG) injection 0-5 Units  0-5 Units Subcutaneous QHS Onuoha, Chinwendu V, NP   2 Units at 01/01/24 2223   insulin aspart (novoLOG) injection 0-9 Units  0-9 Units Subcutaneous TID WC Onuoha, Chinwendu V, NP   2 Units at 01/04/24 1203   insulin aspart (novoLOG) injection 6 Units  6 Units Subcutaneous TID with meals [name], MD   6 Units at 01/04/24 1204   [START ON 01/05/2024] insulin glargine (LANTUS) injection 22 Units  22 Units Subcutaneous q AM [name], MD       magnesium hydroxide (MILK OF MAGNESIA) suspension 30 mL  30 mL Oral Daily PRN Margaretmary Dys, MD       nicotine (NICODERM CQ - dosed in mg/24 hours) patch 14 mg  14 mg Transdermal Q0600 Margaretmary Dys, MD       traZODone (DESYREL) tablet 50 mg  50 mg Oral QHS PRN Margaretmary Dys, MD   50 mg at 01/02/24 2102   Vitamin D (Ergocalciferol) (DRISDOL) 1.25 MG (50000 UNIT) capsule 50,000 Units  50,000 Units Oral Q7 days [name], MD   50,000 Units at 01/03/24 1216    Lab Results:  Results for orders placed or performed  during the hospital encounter of 12/26/23 (from the past 48 hours)  Glucose, capillary     Status: Abnormal   Collection Time: 01/02/24  5:02 PM  Result Value Ref Range   Glucose-Capillary 235 (H) 70 - 99 mg/dL    Comment: Glucose reference range applies only to samples taken after fasting for at least 8 hours.  Folate     Status: None   Collection Time: 01/02/24  6:37 PM  Result Value Ref Range   Folate 9.4 >5.9 ng/mL    Comment: Performed at [facility], [address], [place], [place] [address]  RPR     Status: None   Collection Time: 01/02/24  6:37 PM  Result Value Ref Range   RPR Ser Ql NON REACTIVE NON REACTIVE    Comment: Performed at [facility], [address], [place], [place] [address]  Vitamin B12     Status: None   Collection Time: 01/02/24  6:37 PM  Result Value Ref Range   Vitamin B-12 800 180 - 914 pg/mL    Comment: (NOTE) This assay is not validated for testing neonatal or myeloproliferative syndrome specimens for Vitamin B12 levels. Performed at [facility], [address], [place], [place] [address]   VITAMIN D 25 Hydroxy (Vit-D Deficiency, Fractures)     Status: Abnormal   Collection Time: 01/02/24  6:37 PM  Result Value Ref Range   Vit D, 25-Hydroxy 18.55 (L) 30 - 100 ng/mL    Comment: (NOTE) Vitamin D deficiency has been defined by the Institute of Medicine  and an Endocrine Society practice guideline as a level of serum 25-OH  vitamin D less than 20 ng/mL (1,2). The Endocrine Society went on to  further define vitamin D insufficiency as a level between 21 and 29  ng/mL (2).  1. IOM (Institute of Medicine). 2010. Dietary reference intakes for  calcium and D. [place]: [employer]. 2. Holick MF, Binkley Bradley, Bischoff-Ferrari HA, et al. Evaluation,  treatment, and prevention of vitamin D deficiency: an Endocrine  Society clinical practice guideline, JCEM. 2011 Jul; 96(7):  1911-30.  Performed at [facility], [address] [place], [place] [address]   Glucose, capillary     Status: None   Collection Time: 01/02/24  7:31 PM  Result Value Ref Range   Glucose-Capillary 98 70 - 99 mg/dL    Comment: Glucose reference range applies only to samples taken after fasting for at least 8 hours.  Glucose, capillary     Status: Abnormal   Collection Time: 01/03/24  6:04 AM  Result Value Ref Range   Glucose-Capillary 226 (H) 70 - 99 mg/dL    Comment: Glucose reference range applies only to samples taken after fasting for at least 8 hours.  Glucose, capillary     Status: Abnormal   Collection Time: 01/03/24 12:02 PM  Result Value Ref Range   Glucose-Capillary 361 (H) 70 - 99 mg/dL    Comment: Glucose reference range applies only to samples taken after fasting for at least 8 hours.  Glucose, capillary     Status: Abnormal   Collection Time: 01/03/24  5:06 PM  Result Value Ref Range   Glucose-Capillary 220 (H) 70 - 99 mg/dL    Comment: Glucose reference range applies only to samples taken after fasting for at least 8 hours.  Glucose, capillary     Status: Abnormal   Collection Time: 01/03/24  8:59 PM  Result Value Ref Range   Glucose-Capillary 138 (H) 70 - 99 mg/dL    Comment: Glucose reference range applies only to samples taken after fasting for at least 8 hours.  Glucose, capillary     Status: Abnormal   Collection Time: 01/04/24  6:16 AM  Result Value Ref Range   Glucose-Capillary 47 (L) 70 - 99 mg/dL    Comment: Glucose reference range applies only to samples taken after fasting for at least 8 hours.  Glucose, capillary     Status: Abnormal   Collection Time: 01/04/24  6:35 AM  Result Value Ref Range   Glucose-Capillary 114 (H) 70 - 99 mg/dL    Comment: Glucose reference range applies only to samples taken after fasting for at least 8 hours.  Glucose, capillary     Status: Abnormal   Collection Time: 01/04/24  7:33 AM  Result Value Ref Range    Glucose-Capillary 255 (H) 70 - 99 mg/dL    Comment: Glucose reference range applies only to samples taken after fasting for at least 8 hours.  Glucose, capillary     Status: Abnormal   Collection Time: 01/04/24  8:48 AM  Result Value Ref Range   Glucose-Capillary 269 (H) 70 - 99 mg/dL    Comment: Glucose reference range applies only to samples taken after fasting for at least 8 hours.  Glucose, capillary     Status: Abnormal   Collection Time: 01/04/24 11:57 AM  Result Value Ref Range   Glucose-Capillary 186 (H) 70 - 99 mg/dL    Comment: Glucose reference range applies only to samples taken after fasting for at least 8 hours.    Blood Alcohol level:  Lab Results  Component Value Date   ETH <10 12/23/2023   ETH <10 12/12/2023    Metabolic Disorder Labs: Lab Results  Component Value Date   HGBA1C 11.4 (H) 10/23/2023   MPG 280.48 10/23/2023   MPG >398 07/28/2020   Lab Results  Component Value Date   PROLACTIN 18.7 12/27/2023   Lab Results  Component Value Date   CHOL 200 12/27/2023   TRIG 194 (H) 12/27/2023   HDL 84 12/27/2023   CHOLHDL 2.4  12/27/2023   VLDL 39 12/27/2023   LDLCALC 77 12/27/2023   LDLCALC 125 (H) 03/03/2022     Musculoskeletal: Strength & Muscle Tone: within normal limits Gait & Station: normal Patient leans: N/A  Psychiatric Specialty Exam:  Presentation  General Appearance:  Appropriate for Environment  Eye Contact: Good  Speech: Pressured  Speech Volume: Increased  Handedness: Right   Mood and Affect  Mood: Euphoric  Affect: Inappropriate   Thought Process  Thought Processes: Linear  Descriptions of Associations:Loose  Orientation:Full (Time, Place and Person)  Thought Content:Illogical; Delusions  History of Schizophrenia/Schizoaffective disorder:Yes  Duration of Psychotic Symptoms:Greater than six months  Hallucinations:No data recorded   Ideas of Reference:None  Suicidal Thoughts:No data  recorded   Homicidal Thoughts:No data recorded    Sensorium  Memory: Immediate Good; Recent Good  Judgment: Poor  Insight: Poor   Executive Functions  Concentration: Good  Attention Span: Good  Recall: Good  Fund of Knowledge: Good  Language: Good   Psychomotor Activity  Psychomotor Activity: No data recorded    Assets  Assets: Communication Skills; Social Support; Leisure Time   Sleep  Sleep: No data recorded   Physical Exam: Physical Exam Vitals and nursing note reviewed.  Constitutional:      General: He is not in acute distress.    Appearance: Normal appearance. He is normal weight. He is not ill-appearing or toxic-appearing.  HENT:     Head: Normocephalic and atraumatic.  Pulmonary:     Effort: Pulmonary effort is normal.  Neurological:     Mental Status: He is alert.    Review of Systems  Respiratory:  Negative for cough and shortness of breath.   Cardiovascular:  Negative for chest pain.  Gastrointestinal:  Negative for abdominal pain, constipation, diarrhea, nausea and vomiting.  Neurological:  Negative for dizziness, weakness and headaches.  Psychiatric/Behavioral:  Negative for depression, hallucinations and suicidal ideas. The patient is not nervous/anxious.    Blood pressure (!) 128/90, pulse 79, temperature 97.8 F (36.6 C), temperature source Oral, resp. rate 16, height 5\' 9"  (1.753 m), weight 58.5 kg, SpO2 100%. Body mass index is 19.05 kg/m.   Treatment Plan Summary: Daily contact with patient to assess and evaluate symptoms and progress in treatment and Medication management  Jacob [name] is a 33 yr old male who presented to [facility] on 2/21 in DKA, he was admitted and treated and then admitted to [facility] on 2/25.  PPHx is significant for Schizoaffective Disorder, Bipolar Type and Medication Non-Compliance, 1 Suicide Attempt (2019), and Multiple Psychiatric Hospitalizations [facility], old [place], possibly [employer], [employer] and [facility] "their salad bar slaps").    Schizoaffective Disorder, Bipolar Type: -Increase Abilify to 15 mg for bipolar disorder -Continue Agitation Protocol: Haldol/Ativan/Benadryl  Vitamin D deficiency (18.55) -Ergocalciferol 50,000 unit weekly  T1DM Reviewed on 01/04/24 --glucose range over the last 24 hours has been 47-269.  The patient reportedly did not eat last night due to receiving as needed medications for agitation leading to low blood sugar and hypoglycemic protocol overnight. -Continue SSI -Continue Novolog 5 units 3 times daily with meals - Novolog HS coverage -Decrease Lantus to 22 units per diabetes nurse's recommendations  Nicotine Dependence: -Continue Nicotine Patch 14 mg daily -Continue PRN's: Tylenol, Maalox, Atarax, Milk of Magnesia, Trazodone --  The risks/benefits/side-effects/alternatives to this medication were discussed in detail with the patient and time was given for questions. The patient consents to medication trial.              --  Metabolic profile and EKG monitoring obtained while on an atypical antipsychotic (BMI: Lipid Panel: HbgA1c: QTc:)              -- Encouraged patient to participate in unit milieu and in scheduled group therapies          Safety and Monitoring:             -- Involuntary admission to inpatient psychiatric unit for safety, stabilization and treatment             -- Daily contact with patient to assess and evaluate symptoms and progress in treatment             -- Patient's case to be discussed in multi-disciplinary team meeting             -- Observation Level : q15 minute checks             -- Vital signs:  q12 hours             -- Precautions: suicide, elopement, and assault  Discharge Planning:              -- Social work and case management to assist with discharge planning and identification of hospital follow-up needs prior to discharge             -- Estimated LOS: Thursday, March  6             -- Discharge Concerns: Need to establish a safety plan; Medication compliance and effectiveness             -- Discharge Goals: Return home with outpatient referrals for mental health follow-up including medication management/psychotherapy  Signed: [name], MD 01/04/2024, 12:13 PM

## 2024-01-04 NOTE — Plan of Care (Signed)
  Problem: Education: Goal: Mental status will improve Outcome: Progressing   

## 2024-01-04 NOTE — Group Note (Signed)
 Recreation Therapy Group Note   Group Topic:Health and Wellness  Group Date: 01/04/2024 Start Time: 1024 End Time: 1041 Facilitators: Jhania Etherington-McCall, LRT,CTRS Location: [address] Dayroom   Group Topic: Wellness  Goal Area(s) Addresses:  Patient will define components of whole wellness. Patient will verbalize benefit of whole wellness.  Intervention: Worksheets  Activity: Financial controller. LRT and patients discussed the components of wellness (mental, physical and spiritual). LRT and patients also discussed the importance of wellness and how it affects [place] on a daily basis. LRT then gave patients two worksheets of brain teasers. LRT explained to patients instead of doing physical exercise, they were going to exercise their brains and solve the brain teasers presented on the worksheets. Patients were given 20 minutes to decode as many of brain teasers they could before they went over them as a group   Education: Wellness, Building control surveyor.   Education Outcome: Acknowledges education/In group clarification offered/Needs additional education.   Affect/Mood: N/A   Participation Level: Did not attend    Clinical Observations/Individualized Feedback:      Plan: Continue to engage patient in RT group sessions 2-3x/week.   Rockey Guarino-McCall, LRT,CTRS 01/04/2024 12:27 PM

## 2024-01-04 NOTE — Inpatient Diabetes Management (Signed)
 Inpatient Diabetes Program Recommendations  AACE/ADA: New Consensus Statement on Inpatient Glycemic Control (2015)  Target Ranges:  Prepandial:   less than 140 mg/dL      Peak postprandial:   less than 180 mg/dL (1-2 hours)      Critically ill patients:  140 - 180 mg/dL   Lab Results  Component Value Date   GLUCAP 255 (H) 01/04/2024   HGBA1C 11.4 (H) 10/23/2023    Review of Glycemic Control  Latest Reference Range & Units 01/03/24 12:02 01/03/24 17:06 01/03/24 20:59 01/04/24 06:16 01/04/24 06:35 01/04/24 07:33  Glucose-Capillary 70 - 99 mg/dL 161 (H) 096 (H) 045 (H) 47 (L) 114 (H) 255 (H)  (H): Data is abnormally high (L): Data is abnormally low  Home DM Meds: Lantus 24 units Daily   Humalog 10 units TID meal coverage    OmniPod Insulin Pump   Current Orders: Semglee 25 units daily              Novolog 0-9 units TID ac/hs  Novolog 6 units TID with meals         Noted hypoglycemia this AM of 47 mg/dL. Consider:   Decrease Semglee back to 22 units every day.   Thanks, Lujean Rave, MSN, RNC-OB Diabetes Coordinator [phone] (8a-5p)

## 2024-01-04 NOTE — Progress Notes (Signed)
 Hypoglycemic Event  CBG: 47  Treatment: 4 oz juice/soda and 8 oz juice/soda  Symptoms: None  Follow-up CBG: Time:0636 CBG Result:117  Possible Reasons for Event: Inadequate meal intake  Comments/MD notified:Patient was sleeping for 12 hours.    Davonna Belling Ghina Bittinger

## 2024-01-05 ENCOUNTER — Other Ambulatory Visit: Payer: Self-pay | Admitting: *Deleted

## 2024-01-05 ENCOUNTER — Telehealth: Payer: Self-pay

## 2024-01-05 ENCOUNTER — Other Ambulatory Visit (HOSPITAL_COMMUNITY): Payer: Self-pay

## 2024-01-05 DIAGNOSIS — E109 Type 1 diabetes mellitus without complications: Secondary | ICD-10-CM

## 2024-01-05 DIAGNOSIS — F250 Schizoaffective disorder, bipolar type: Secondary | ICD-10-CM

## 2024-01-05 LAB — GLUCOSE, CAPILLARY
Glucose-Capillary: 82 mg/dL (ref 70–99)
Glucose-Capillary: 396 mg/dL — ABNORMAL HIGH (ref 70–99)
Glucose-Capillary: 306 mg/dL — ABNORMAL HIGH (ref 70–99)
Glucose-Capillary: 350 mg/dL — ABNORMAL HIGH (ref 70–99)

## 2024-01-05 MED ORDER — INSULIN ASPART 100 UNIT/ML IJ SOLN: 0.0000 [IU] | Freq: Three times a day (TID) | INTRAMUSCULAR | Status: DC

## 2024-01-05 MED ORDER — ARIPIPRAZOLE 15 MG PO TABS
15.0000 mg | ORAL_TABLET | Freq: Every day | ORAL | 0 refills | Status: DC
  Filled 2024-01-05: qty 30, 30d supply, fill #0

## 2024-01-05 NOTE — Telephone Encounter (Signed)
 Prior Authorization for patient ([employer] G7 Sensor) came through on cover my meds was submitted with last office notes and labs awaiting approval or denial.  WUJ:[id number]

## 2024-01-05 NOTE — [facility] Suicide Risk Assessment (Signed)
 [facility] INPATIENT:  Family/Significant Other Suicide Prevention Education  Suicide Prevention Education:  Education Completed; with patient,  (name of family member/significant other) has been identified by the patient as the family member/significant other with whom the patient will be residing, and identified as the person(s) who will aid the patient in the event of a mental health crisis (suicidal ideations/suicide attempt).  With written consent from the patient, the family member/significant other has been provided the following suicide prevention education, prior to the and/or following the discharge of the patient.  Patient was given "Suicide Education Information" brochure.  Patient doesn't have any weapons.  The suicide prevention education provided includes the following: Suicide risk factors Suicide prevention and interventions National Suicide Hotline telephone number [facility] assessment telephone number [facility] Emergency Assistance 911 [facility] and/or Residential Mobile Crisis Unit telephone number  Request made of family/significant other to: Remove weapons (e.g., guns, rifles, knives), all items previously/currently identified as safety concern.   Remove drugs/medications (over-the-counter, prescriptions, illicit drugs), all items previously/currently identified as a safety concern.  The family member/significant other verbalizes understanding of the suicide prevention education information provided.  The family member/significant other agrees to remove the items of safety concern listed above.  Jerone Cudmore O Ketina Mars, LCSWA 01/05/2024, 10:42 AM

## 2024-01-05 NOTE — Group Note (Signed)
 Recreation Therapy Group Note   Group Topic:Healthy Decision Making  Group Date: 01/05/2024 Start Time: 1010 End Time: 1042 Facilitators: Mailynn Everly-McCall, LRT,CTRS Location: [address] Dayroom   Group Topic: Decision Making, Problem Solving, Communication  Goal Area(s) Addresses:  Patient will effectively work with peer towards shared goal.  Patient will identify factors that guided their decision making.  Patient will pro-socially communicate ideas during group session.   Intervention: Survival Scenario - pencil, paper  Activity: Patients were given a scenario that they were going to be stranded on a deserted [place] for several months before being rescued. Writer tasked them with making a list of 15 things they would choose to bring with them for "survival". The list of items was prioritized most important to least. Each patient would come up with their own list, then work together to create a new list of 15 items while in a group of 3-5 peers. LRT discussed each person's list and how it differed from others. The debrief included discussion of priorities, good decisions versus bad decisions, and how it is important to think before acting so we can make the best decision possible. LRT tied the concept of effective communication among group members to patient's support systems outside of the hospital and its benefit post discharge.  Education: Pharmacist, community, Priorities, Support System, Discharge Planning   Education Outcome: Acknowledges education/In group clarification/Needs additional education   Affect/Mood: N/A   Participation Level: Did not attend    Clinical Observations/Individualized Feedback:      Plan: Continue to engage patient in RT group sessions 2-3x/week.   Jaiveon Suppes-McCall, LRT,CTRS 01/05/2024 11:50 AM

## 2024-01-05 NOTE — Progress Notes (Signed)
  Patient discharged to lobby to w/ mom. Patient stable, no acute distress noted. Discharge information explained and information provided and signed. Valuables returned. Suicide safety plan completed and copy given to pt. Patient denies SI/HI/AVH. Pt given opportunity to ask questions and express concerns.

## 2024-01-05 NOTE — Progress Notes (Signed)
  [facility] Adult Case Management Discharge Plan :  Will you be returning to the same living situation after discharge:  No, patient reported that mom will transport him to [facility]. Yesterday (3/5) patient was given a list with shelter resources. At discharge, do you have transportation home?: Yes,  Linus Orn (mom) [phone]  will pick him up at 1 PM. Do you have the ability to pay for your medications: Yes,  patient has Medicaid  Release of information consent forms completed and in the chart;  Patient's signature needed at discharge.  Patient to Follow up at:  Follow-up Information     [facility] Go on 01/10/2024.   Specialty: Behavioral Health Why: Please go to this provider on 01/06/2024 at 9 AM for medication management and therapy services.  If the time presented is not feasible, you can walk-in Monday through Friday, arrive by 7:00 AM. Contact information: [address] [place] [address] [phone]        [facility] Go on 01/09/2024.   Specialty: Professional Counselor Why: Please go to this provider on 01/09/2024 at 9 AM for interim therapy services.  If the time presented is not feasible, you can walk-in Monday through Friday, between 9 AM and 1 PM. Contact information: [address] [place] [address] [phone]         [facility] Follow up.   Specialty: Addiction Medicine Why: Referral made Contact information: [address] [place] [address] [phone]                 Next level of care provider has access to [facility] Link:no  Safety Planning and Suicide Prevention discussed: Yes,  with patient  Has patient been referred to the Quitline?:  Yes, patient was given a print-out.  Patient has been referred for addiction treatment: Patient refused referral for treatment.  CSW recevied a voicemail from [facility] reporting that  patient was not accepted to [facility] due to medical reasons (diabetes).  Patient completed only part of his intake interview for [facility] on 01/03/2024, and refused to finish it.   Sherida Dobkins O Anna Beaird, LCSWA 01/05/2024, 12:54 PM

## 2024-01-05 NOTE — Telephone Encounter (Signed)
 Copied from CRM [id number]. Topic: Clinical - Prescription Issue >> Jan 05, 2024  2:39 PM Prudencio Pair wrote: Reason for CRM: Patient called in regards to his insulin prescriptions. He states they were at the pharmacy & was told they have to be approved by the provider first. The medications are  insulin aspart (novoLOG) injection 0-9 Units & insulin glargine (LANTUS) injection 22 Units. Patient, along with mom in the background, would like a call to let them know once this has been completed.

## 2024-01-05 NOTE — Progress Notes (Signed)
   01/05/24 0900  Psych Admission Type (Psych Patients Only)  Admission Status Voluntary  Psychosocial Assessment  Patient Complaints None  Eye Contact Fair  Facial Expression Animated  Affect Preoccupied  Speech Logical/coherent  Interaction Assertive  Motor Activity Fidgety  Appearance/Hygiene Unremarkable  Behavior Characteristics Appropriate to situation;Cooperative  Mood Preoccupied;Pleasant  Thought Process  Coherency WDL  Content WDL  Delusions None reported or observed  Perception WDL  Hallucination None reported or observed  Judgment Impaired  Confusion None  Danger to Self  Current suicidal ideation? Denies  Danger to Others  Danger to Others None reported or observed

## 2024-01-05 NOTE — Telephone Encounter (Addendum)
 I called pt to clarify dosages of his insulin. Pt stated he takes 30 units (instead of 24 units) of Lantus daily; and 7 units (instead of 10 units) TID. His mother called back and stated he has not been with her so she's unsure how much insulin he's taking. Pt mentioned he has been with his father. I told his mother sending to his doctor to clarify dosages. Send rx's [facility].

## 2024-01-05 NOTE — BHH Suicide Risk Assessment (Signed)
 [facility] Discharge Suicide Risk Assessment   Principal Problem: Schizoaffective disorder [facility])  Discharge Diagnoses: Principal Problem:   Schizoaffective disorder (HCC) Active Problems:   Uncontrolled type 1 diabetes mellitus with hyperglycemia, with long-term current use of insulin (HCC)      Total Time spent with patient: 40  Musculoskeletal: Strength & Muscle Tone: within normal limits Gait & Station: normal Patient leans: N/A   Psychiatric Specialty Exam:  Presentation  General Appearance: Appropriate for Environment  Eye Contact: Good  Speech: Clear and Coherent; Normal Rate  Speech Volume: Normal  Handedness: Right   Mood and Affect  Mood: Euthymic  Affect: Congruent   Thought Process  Thought Processes: Linear  Descriptions of Associations: Intact  Orientation: Full (Time, Place and Person)  Thought Content: Logical  History of Schizophrenia/Schizoaffective disorder: No  Duration of Psychotic Symptoms: NA Hallucinations: Hallucinations: None  Ideas of Reference: None  Suicidal Thoughts: Suicidal Thoughts: No  Homicidal Thoughts: Homicidal Thoughts: No   Sensorium  Memory: Immediate Good  Judgment: Poor  Insight: Poor   Executive Functions  Concentration: Good  Attention Span: Good  Recall: Good  Fund of Knowledge: Good  Language: Good   Psychomotor Activity  Psychomotor Activity: Psychomotor Activity: Normal   Assets  Assets: Communication Skills; Leisure Time; Resilience   Sleep  Sleep: Sleep: Good   Physical Exam: General: Sitting comfortably. NAD. HEENT: Normocephalic, atraumatic, MMM, EMOI Lungs: no increased work of breathing noted Heart: no cyanosis Abdomen: Non distended Musculoskeletal: FROM. No obvious deformities Skin: Warm, dry, intact. No rashes noted Neuro: No obvious focal deficits.  Gait and station are normal  Review of Systems  Constitutional: Negative.   HENT: Negative.    Eyes: Negative.    Respiratory: Negative.    Cardiovascular: Negative.   Gastrointestinal: Negative.   Genitourinary: Negative.   Skin: Negative.   Neurological: Negative.   Psychiatric/Behavioral:  Negative   Mental Status Per Nursing Assessment: NA  Demographic Factors:  Male, Low socioeconomic status, and Unemployed  Loss Factors: Legal issues and Financial problems/change in socioeconomic status  Historical Factors: Family history of mental illness or substance abuse and Impulsivity  Risk Reduction Factors:   Sense of responsibility to family  Continued Clinical Symptoms:  Previous psychiatric diagnoses and treatments  Cognitive Features That Contribute To Risk:  None  Suicide Risk:  Minimal: No identifiable suicidal ideation.  Patients presenting with no risk factors but with morbid ruminations; may be classified as minimal risk based on the severity of the depressive symptoms.    Follow-up Information     [facility] Go on 01/10/2024.   Specialty: Behavioral Health Why: Please go to this provider for medication management and therapy services. For fastest service, please go Monday through Friday, arrive by 7:00 am. Contact information: [address] [place] [address] [phone]        [facility] Go on 01/09/2024.   Specialty: Professional Counselor Why: Please go to this provider for therapy services, Monday through Friday, between 9 am and 1 pm. Contact information: [address] [place] [address] [phone]         [facility] Follow up.   Specialty: Addiction Medicine Why: Referral made Contact information: [address] [place] [address] [phone]                  Plan Of Care/Follow-up recommendations:  Activity: as tolerated  Diet: heart healthy  Other: -Follow-up with your  outpatient psychiatric provider  -instructions on appointment date, time, and address (location) are provided to you in discharge paperwork.  -Take your psychiatric medications as prescribed at discharge - instructions are provided to you in the discharge paperwork  -Follow-up with outpatient primary care doctor and other specialists -for management of preventative medicine and chronic medical issues  -Testing: Follow-up with outpatient provider for abnormal lab results: Hypovitaminosis D  -If you are prescribed an atypical antipsychotic medication, we recommend that your outpatient psychiatrist follow routine screening for side effects within 3 months of discharge, including monitoring: AIMS scale, height, weight, blood pressure, fasting lipid panel, HbA1c, and fasting blood sugar.   -Recommend total abstinence from alcohol, tobacco, and other illicit drug use at discharge.   -If your psychiatric symptoms recur, worsen, or if you have side effects to your psychiatric medications, call your outpatient psychiatric provider, 911, [phone] or go to the nearest emergency department.  -If suicidal thoughts occur, immediately call your outpatient psychiatric provider, 911, [phone] or go to the nearest emergency department.   Criss Alvine, MD 01/05/24 11:39 AM

## 2024-01-05 NOTE — Plan of Care (Signed)
  Problem: Education: Goal: Knowledge of [facility] Education information/materials will improve Outcome: Progressing   Problem: Activity: Goal: Interest or engagement in activities will improve Outcome: Progressing   Problem: Coping: Goal: Ability to verbalize frustrations and anger appropriately will improve Outcome: Progressing   Problem: Safety: Goal: Periods of time without injury will increase Outcome: Progressing   Problem: Education: Goal: Knowledge of [facility] Education information/materials will improve Outcome: Progressing   Problem: Activity: Goal: Interest or engagement in activities will improve Outcome: Progressing   Problem: Coping: Goal: Ability to verbalize frustrations and anger appropriately will improve Outcome: Progressing   Problem: Safety: Goal: Periods of time without injury will increase Outcome: Progressing

## 2024-01-05 NOTE — Inpatient Diabetes Management (Signed)
 Inpatient Diabetes Program Recommendations  AACE/ADA: New Consensus Statement on Inpatient Glycemic Control (2015)  Target Ranges:  Prepandial:   less than 140 mg/dL      Peak postprandial:   less than 180 mg/dL (1-2 hours)      Critically ill patients:  140 - 180 mg/dL   Lab Results  Component Value Date   GLUCAP 350 (H) 01/05/2024   HGBA1C 11.4 (H) 10/23/2023    Review of Glycemic Control  Latest Reference Range & Units 01/05/24 05:57 01/05/24 08:55 01/05/24 10:07 01/05/24 12:01  Glucose-Capillary 70 - 99 mg/dL 82 161 (H) 096 (H) 045 (H)  (H): Data is abnormally high  Home DM Meds: Lantus 24 units Daily   Humalog 10 units TID meal coverage    OmniPod Insulin Pump   Current Orders: Semglee 22 units daily              Novolog 0-9 units TID ac/hs  Novolog 6 units TID with meals    Inpatient Diabetes Program Recommendations:    Might consider increasing meals coverage insulin:  Novolog 10 units TID with meals if he consumes at least 50%.  Will continue to follow while inpatient.  Thank you, Dulce Sellar, MSN, CDCES Diabetes Coordinator Inpatient Diabetes Program [phone] (team pager from 8a-5p)

## 2024-01-06 NOTE — Discharge Summary (Signed)
 Physician Discharge Summary Note  Patient:  Jacob [name] is a 33 y.o. male  MRN:  [id number]  DOB:  01/16/1991  Patient phone: [phone] (home)  Patient address:   [address] [place] [address]   Total Time spent with patient: 42 Minutes  Date of Admission:  12/26/2023  Date of Discharge: 01/06/24   Reason for Admission:  Jacob [name] is a 33 yr old male who presented to [facility] on 2/21 in DKA, he was admitted and treated and then admitted to [facility] on 2/25.  PPHx is significant for Schizoaffective Disorder, Bipolar Type and Medication Non-Compliance, 1 Suicide Attempt (2019), and Multiple Psychiatric Hospitalizations [facility], old [place], possibly [facility], [facility] and [facility] "their salad bar slaps").   When asked why he was admitted to the hospital he reports that his mother was doing a criminal investigation with the sheriff's department.  He reports he has lots of stocks.  He reports that his family thought he was going to be killed but his baby mamma and her family.  He then reports that his baby mamma is pregnant with 2 fetuses.  He reports one fetus took nutrients from the other one.  He reports that he then took this fetus and "made it my fetus."  He then reports "why do I need Social Security when I got stocks with bonds."  He reports that since he has been investing by not buying lunch at school since middle school he should now have billions of dollars.  He reports that he has been admitted to the psychiatric facility multiple times because it was written in his grandmother's will.  He then repeated the phrase "eat and throw up" 3 times to "get my mind in order."   He reports no significant past psychiatric history (per chart review significant for schizoaffective disorder, bipolar type and medication noncompliance).  He reports no history of suicide attempts (per chart review 1 suicide attempt 2019).  He reports no history of  self-injurious behavior.  He reports multiple prior psychiatric hospitalizations (per chart review [facility], old [place], possibly [facility], [facility] and [facility] "their salad bar slaps").  He reports past medical history significant for diabetes.  He reports no significant past surgical history.  He reports no history of head trauma.  He reports no history of seizures.  He reports NKDA.   He reports he currently lives with his grandma.  He reports he graduated high school.  He reports some college.  He reports he is currently unemployed.  He reports no alcohol use.  He reports he vapes nicotine.  He reports rare THC use and "other things."  When asked about current legal issues he reports he has a 50 be on his grandmother and his grandmother has a 50 be on him which is why he can no longer live with her and is currently homeless.  He then reports he has got 1 charge for attempted murder on the [place], 2 charges of attempted murder on the [facility], and 3...".  He reports no access to firearms.   Discussed medications with him.  He reports he used to be on an injection when he was still working.  He reports that it was too much of a bother to get the injection and so he does not want that.  He reports he is willing to take pills but will not take an injection.  He reports no other concerns at  present.     Notified by nursing staff that patient's CBG was on readable by the monitor.  Staff reported that insulin had been given and after a recheck CBG was still unreadable.  Discussed with them that patient would need to be transferred to [facility].   Called [facility] and spoke with Dr. Andria Meuse.  Discussed patient's unreadable CBG levels along with vitals.  He accepted the patient.  Principal Problem: Schizoaffective disorder [facility])  Discharge Diagnoses: Principal Problem:   Schizoaffective disorder (HCC) Active Problems:   Uncontrolled type 1 diabetes mellitus with  hyperglycemia, with long-term current use of insulin (HCC)     Past Psychiatric (and medical) History: Jacob [name]  has a past medical history of Bipolar 1 disorder (HCC), History of attempted suicide (2019), Schizoaffective disorder (HCC), Type 1 diabetes mellitus on insulin therapy (HCC) (2019), and Vitamin D deficiency (01/03/2024).   Past Medical History:  Past Medical History:  Diagnosis Date   Bipolar 1 disorder (HCC)    History of attempted suicide 2019   Unsuccessful suicide attempt in 2019 with rat poison   Schizoaffective disorder (HCC)    Type 1 diabetes mellitus on insulin therapy (HCC) 2019   Vitamin D deficiency 01/03/2024     History reviewed. No pertinent surgical history.   Family History:  Family History  Problem Relation Age of Onset   Healthy Mother    Healthy Father      Family Psychiatric  History: Schizoaffective Disorder, Bipolar Type and Medication Non-Compliance, 1 Suicide Attempt (2019), and Multiple Psychiatric Hospitalizations [facility], old [place], possibly [facility], [facility] and [facility] "their salad bar slaps").   Social History:  Social History   Substance and Sexual Activity  Alcohol Use Yes   Comment: social/occassional     Social History   Substance and Sexual Activity  Drug Use Not Currently   Types: Marijuana     Social History   Socioeconomic History   Marital status: Single    Spouse name: Not on file   Number of children: Not on file   Years of education: Not on file   Highest education level: Not on file  Occupational History   Not on file  Tobacco Use   Smoking status: Former    Types: Cigars, Cigarettes   Smokeless tobacco: Never   Tobacco comments:    2 BLACK AND MILD A DAY  Vaping Use   Vaping status: Every Day  Substance and Sexual Activity   Alcohol use: Yes    Comment: social/occassional   Drug use: Not Currently    Types: Marijuana   Sexual activity: Not on file   Other Topics Concern   Not on file  Social History Narrative   Not on file   Social Drivers of Health   Financial Resource Strain: Not on file  Food Insecurity: Food Insecurity Present (12/26/2023)   Hunger Vital Sign    Worried About Running Out of Food in the Last Year: Sometimes true    Ran Out of Food in the Last Year: Sometimes true  Transportation Needs: No Transportation Needs (12/26/2023)   PRAPARE - Administrator, Civil Service (Medical): No    Lack of Transportation (Non-Medical): No  Physical Activity: Not on file  Stress: Not on file  Social Connections: Socially Isolated (12/26/2023)   Social Connection and Isolation Panel [NHANES]    Frequency of Communication with Friends and Family: Never    Frequency of Social Gatherings with Friends and  Family: Never    Attends Religious Services: Never    Database administrator or Organizations: No    Attends Banker Meetings: Never    Marital Status: Never married     Hospital Course:  During the patient's hospitalization, patient had extensive initial psychiatric evaluation, and follow-up psychiatric evaluations every day.  Psychiatric diagnoses provided upon initial assessment: Hyperglycemia [R73.9] Schizoaffective disorder (HCC) [F25.9]   The patient's psychiatric medications were adjusted as above.  Abilify was started orally and increase to 15 mg daily.  Vitamin D was started for vitamin D deficiency.  Patient's care was discussed during the interdisciplinary team meeting every day during the hospitalization.  The patient denied having side effects to prescribed psychiatric medication.  The patient never fully adjusted to the milieu.  Throughout the hospitalization he was irritable and preoccupied with eating in a manner that is irresponsible with type 1 diabetes.  He was irritable with nursing staff, particularly male staff.  He required as needed medications for agitation due to making  threatening remarks to staff.    The patient was evaluated each day by a clinical provider to ascertain response to treatment. Improvement was noted by the patient's report of decreasing symptoms, improved sleep and appetite, affect, medication tolerance, behavior, and participation in unit programming.  Patient was asked each day to complete a self inventory noting mood, mental status, pain, new symptoms, anxiety and concerns.    Symptoms were reported as significantly decreased or resolved completely by discharge.   On day of discharge, the patient reports that their mood is stable. The patient denied having suicidal thoughts for more than 48 hours prior to discharge.  Patient denies having homicidal thoughts.  Patient denies having auditory hallucinations.  Patient denies any visual hallucinations or other symptoms of psychosis. The patient was motivated to continue taking medication with a goal of continued improvement in mental health.   The patient reports their target psychiatric symptoms of "not being right" responded well to the psychiatric medications, and the patient reports overall benefit other psychiatric hospitalization. Supportive psychotherapy was provided to the patient. The patient also participated in regular group therapy while hospitalized. Coping skills, problem solving as well as relaxation therapies were also part of the unit programming.  Labs were reviewed with the patient, and abnormal results were discussed with the patient.  The patient is able to verbalize their individual safety plan to this provider.    Physical Findings:  AIMS:  Facial and Oral Movements: None Muscles of Facial Expression: None Lips and Perioral Area: None Jaw: None Tongue: None,Extremity Movements Upper (arms, wrists, hands, fingers): None Lower (legs, knees, ankles, toes): None, Trunk Movements Neck, shoulders, hips: None, Global Judgements Severity of abnormal movements overall:  None Incapacitation due to abnormal movements: None Patient's awareness of abnormal movements: No Awareness, Dental Status Current problems with teeth and/or dentures: No Does patient usually wear dentures: No [place]: No   CIWA:   NA  COWS:  NA  Musculoskeletal: Strength & Muscle Tone: within normal limits Gait & Station: normal Patient leans: N/A    Psychiatric Specialty Exam:  Presentation  General Appearance: Appropriate for Environment  Eye Contact: Good  Speech: Clear and Coherent; Normal Rate  Speech Volume: Normal  Handedness: Right   Mood and Affect  Mood: Euthymic  Affect: Congruent   Thought Process  Thought Processes: Linear  Descriptions of Associations: Intact  Orientation: Full (Time, Place and Person)  Thought Content: Logical  History of Schizophrenia/Schizoaffective disorder:  No  Duration of Psychotic Symptoms: NA Hallucinations: Hallucinations: None  Ideas of Reference: None  Suicidal Thoughts: Suicidal Thoughts: No  Homicidal Thoughts: Homicidal Thoughts: No   Sensorium  Memory: Immediate Good  Judgment: Poor  Insight: Poor   Executive Functions  Concentration: Good  Attention Span: Good  Recall: Good  Fund of Knowledge: Good  Language: Good   Psychomotor Activity  Psychomotor Activity: Psychomotor Activity: Normal   Assets  Assets: Communication Skills; Leisure Time; Resilience   Sleep  Sleep: Sleep: Good      Physical Exam: General: Sitting comfortably. NAD. HEENT: Normocephalic, atraumatic, MMM, EMOI Lungs: no increased work of breathing noted Heart: no cyanosis Abdomen: Non distended Musculoskeletal: FROM. No obvious deformities Skin: Warm, dry, intact. No rashes noted Neuro: No obvious focal deficits.  Gait and station are normal  Review of Systems:  Constitutional: Negative.   HENT: Negative.    Eyes: Negative.   Respiratory: Negative.    Cardiovascular: Negative.   Gastrointestinal:  Negative.   Genitourinary: Negative.   Skin: Negative.   Neurological: Negative.   Psychiatric/Behavioral:  Negative  Blood pressure 123/84, pulse 79, temperature 98.4 F (36.9 C), temperature source Oral, resp. rate 18, height 5\' 9"  (1.753 m), weight 58.5 kg, SpO2 100%. Body mass index is 19.05 kg/m.    Social History   Tobacco Use  Smoking Status Former   Types: Cigars, Cigarettes  Smokeless Tobacco Never  Tobacco Comments   2 BLACK AND MILD A DAY     Tobacco Cessation:  A prescription for an FDA approved medication for tobacco cessation was not prescribed because: Patient Refused   Blood Alcohol level:  Lab Results  Component Value Date   ETH <10 12/23/2023   ETH <10 12/12/2023    Metabolic Disorder Labs:  Lab Results  Component Value Date   HGBA1C 11.4 (H) 10/23/2023   MPG 280.48 10/23/2023   MPG >398 07/28/2020   Lab Results  Component Value Date   PROLACTIN 18.7 12/27/2023    Lab Results  Component Value Date   CHOL 200 12/27/2023   TRIG 194 (H) 12/27/2023   HDL 84 12/27/2023   VLDL 39 12/27/2023   LDLCALC 77 12/27/2023   LDLCALC 125 (H) 03/03/2022      See Psychiatric Specialty Exam and Suicide Risk Assessment completed by Attending Physician prior to discharge.  Discharge destination: Homeless shelter  Is patient on multiple antipsychotic therapies at discharge:  No  Has Patient had three or more failed trials of antipsychotic monotherapy by history: NA Recommended Plan for Multiple Antipsychotic Therapies: NA   Discharge Instructions     Diet - low sodium heart healthy   Complete by: As directed    Increase activity slowly   Complete by: As directed         Allergies as of 01/05/2024   No Known Allergies      Medication List     STOP taking these medications    benztropine 1 MG tablet Commonly known as: COGENTIN   Omnipod 5 DexG7G6 Pods Gen 5 Misc   risperiDONE 1 MG tablet Commonly known as: RISPERDAL       TAKE  these medications      Indication  Accu-Chek Guide Test test strip Generic drug: glucose blood Use up to 3 strips a day when not wearing Continuous glucose monitor  Indication: DM1   ARIPiprazole 15 MG tablet Commonly known as: ABILIFY Take 1 tablet (15 mg total) by mouth daily.  Indication: MIXED BIPOLAR AFFECTIVE  DISORDER   Dexcom G7 Sensor Misc Place new sensor every 10 days. Use to continuously monitor blood sugar.  Indication: DM1   insulin glargine 100 UNIT/ML injection Commonly known as: LANTUS Inject 0.24 mLs (24 Units total) into the skin daily.  Indication: Type 1 Diabetes   insulin lispro 100 UNIT/ML KwikPen Commonly known as: HUMALOG Inject 10 Units into the skin 3 (three) times daily before meals.  Indication: Type 1 Diabetes   Insupen Pen Needles 32G X 4 MM Misc Generic drug: Insulin Pen Needle Use to inject insulin up to 4 times a day What changed: Another medication with the same name was removed. Continue taking this medication, and follow the directions you see here.  Indication: DM1   Lancet Device Misc 1 each by Does not apply route 3 (three) times daily. May dispense any manufacturer covered by patient's insurance.  Indication: DM1          Follow-up Information     [facility]. Go on 01/10/2024.   Specialty: Behavioral Health Why: Please go to this provider on 01/06/2024 at 9 AM for medication management and therapy services.  If the time presented is not feasible, you can walk-in Monday through Friday, arrive by 7:00 AM. Contact information: [address] [place] [address] [phone]        [facility]. Go on 01/09/2024.   Specialty: Professional Counselor Why: Please go to this provider on 01/09/2024 at 9 AM for interim therapy services.  If the time presented is not feasible, you can walk-in Monday through Friday, between 9 AM and 1 PM. Contact information: [address] [place] [address] [phone]         [facility] Follow up.   Specialty: Addiction Medicine Why: Referral made Contact information: [address] [place] [address] [phone]                    Follow-up recommendations:  - It is recommended to the patient to continue psychiatric medications as prescribed, after discharge from the hospital.   - It is recommended to the patient to follow up with your outpatient psychiatric provider and PCP. - It was discussed with the patient, the impact of alcohol, drugs, tobacco have been there overall psychiatric and medical wellbeing, and total abstinence from substance use was recommended the patient. - Prescriptions provided or sent directly to preferred pharmacy at discharge. Patient agreeable to plan. Given opportunity to ask questions. Appears to feel comfortable with discharge.   - In the event of worsening symptoms, the patient is instructed to call the crisis hotline, 911 and or go to the nearest ED for appropriate evaluation and treatment of symptoms. To follow-up with primary care provider for other medical issues, concerns and or health care needs - Patient was discharged home a shelter with a plan to follow up as noted above.   Comments:  NA  Signed: Criss Alvine, MD 01/06/24 1:56 PM      \

## 2024-01-06 NOTE — Telephone Encounter (Signed)
 Jacob [name] (Key: [id number]) PA Case ID #: [id number] Rx #: [id number] Need Help? Call us at [phone] Outcome Approved today by [employer] 2017 Approved. DEXCOM G7 SENSOR Misc is approved from 01/06/2024 to 07/08/2024. All strengths of the drug are approved. Effective Date: 01/06/2024 Authorization Expiration Date: 07/08/2024 Drug Dexcom G7 Sensor ePA cloud logo Form [employer] Electronic Prior Authorization Form Original Claim Info 75

## 2024-01-09 ENCOUNTER — Ambulatory Visit (INDEPENDENT_AMBULATORY_CARE_PROVIDER_SITE_OTHER): Payer: MEDICAID | Admitting: Student

## 2024-01-09 ENCOUNTER — Other Ambulatory Visit (HOSPITAL_COMMUNITY): Payer: Self-pay

## 2024-01-09 VITALS — BP 127/94 | HR 102 | Temp 97.8°F | Ht 69.0 in | Wt 134.2 lb

## 2024-01-09 DIAGNOSIS — F250 Schizoaffective disorder, bipolar type: Secondary | ICD-10-CM | POA: Diagnosis not present

## 2024-01-09 DIAGNOSIS — E559 Vitamin D deficiency, unspecified: Secondary | ICD-10-CM

## 2024-01-09 DIAGNOSIS — Z5900 Homelessness unspecified: Secondary | ICD-10-CM | POA: Diagnosis not present

## 2024-01-09 DIAGNOSIS — E1065 Type 1 diabetes mellitus with hyperglycemia: Secondary | ICD-10-CM

## 2024-01-09 DIAGNOSIS — Z794 Long term (current) use of insulin: Secondary | ICD-10-CM

## 2024-01-09 MED ORDER — ARIPIPRAZOLE 15 MG PO TABS
15.0000 mg | ORAL_TABLET | Freq: Every day | ORAL | 11 refills | Status: DC
  Filled 2024-01-09 (×2): qty 30, 30d supply, fill #0

## 2024-01-09 MED ORDER — INSULIN GLARGINE 100 UNIT/ML ~~LOC~~ SOLN: 24.0000 [IU] | Freq: Every day | SUBCUTANEOUS | 0 refills | Status: DC

## 2024-01-09 MED ORDER — DEXCOM G7 RECEIVER DEVI
1.0000 [IU] | Freq: Every day | 0 refills | Status: DC
Start: 2024-01-09 — End: 2024-01-28
  Filled 2024-01-09: qty 1, 30d supply, fill #0

## 2024-01-09 MED ORDER — VITAMIN D (ERGOCALCIFEROL) 1.25 MG (50000 UNIT) PO CAPS
50000.0000 [IU] | ORAL_CAPSULE | ORAL | 2 refills | Status: DC
  Filled 2024-01-09: qty 4, 28d supply, fill #0

## 2024-01-09 MED ORDER — VITAMIN D (ERGOCALCIFEROL) 1.25 MG (50000 UNIT) PO CAPS
50000.0000 [IU] | ORAL_CAPSULE | ORAL | 1 refills | Status: DC
Start: 2024-01-09 — End: 2024-02-09
  Filled 2024-01-09: qty 4, 28d supply, fill #0

## 2024-01-09 MED ORDER — INSULIN GLARGINE 100 UNIT/ML ~~LOC~~ SOLN
24.0000 [IU] | Freq: Every day | SUBCUTANEOUS | 0 refills | Status: DC
  Filled 2024-01-09: qty 21.6, 90d supply, fill #0

## 2024-01-09 MED ORDER — INSULIN GLARGINE 100 UNIT/ML ~~LOC~~ SOLN
24.0000 [IU] | Freq: Every day | SUBCUTANEOUS | 0 refills | Status: DC
  Filled 2024-01-09: qty 20, 56d supply, fill #0

## 2024-01-09 MED ORDER — INSULIN LISPRO (1 UNIT DIAL) 100 UNIT/ML (KWIKPEN): 10.0000 [IU] | PEN_INJECTOR | Freq: Three times a day (TID) | SUBCUTANEOUS | 0 refills | Status: DC

## 2024-01-09 NOTE — Progress Notes (Unsigned)
 Subjective:  CC: Hospital follow-up  HPI:  [name] is a 33 y.o. person with a past medical history stated below and presents today for the stated chief complaint. Please see problem based assessment and plan for additional details.  Past Medical History:  Diagnosis Date   Bipolar 1 disorder (HCC)    History of attempted suicide 2019   Unsuccessful suicide attempt in 2019 with rat poison   Schizoaffective disorder (HCC)    Type 1 diabetes mellitus on insulin therapy (HCC) 2019   Vitamin D deficiency 01/03/2024    Current Outpatient Medications on File Prior to Visit  Medication Sig Dispense Refill   Continuous Glucose Sensor (DEXCOM G7 SENSOR) MISC Place new sensor every 10 days. Use to continuously monitor blood sugar. 3 each 11   glucose blood (ACCU-CHEK GUIDE TEST) test strip Use up to 3 strips a day when not wearing Continuous glucose monitor 50 each 12   insulin lispro (HUMALOG) 100 UNIT/ML KwikPen Inject 10 Units into the skin 3 (three) times daily before meals. 27 mL 0   Insulin Pen Needle (BD PEN NEEDLE NANO 2ND GEN) 32G X 4 MM MISC Use to inject insulin up to 4 times a day 200 each 3   Lancet Device MISC 1 each by Does not apply route 3 (three) times daily. May dispense any manufacturer covered by patient's insurance. 1 each 0   No current facility-administered medications on file prior to visit.    Review of Systems: Please see assessment and plan for pertinent positives and negatives.  Objective:   Vitals:   01/09/24 1531 01/09/24 1553  BP: (!) 141/82 (!) 127/94  Pulse: (!) 101 (!) 102  Temp: 97.8 F (36.6 C)   TempSrc: Oral   SpO2: 99%   Weight: 134 lb 3.2 oz (60.9 kg)   Height: 5\' 9"  (1.753 m)     Physical Exam: Constitutional: Disheveled-appearing Cardiovascular: Regular rate and rhythm Pulmonary/Chest: lungs clear to auscultation bilaterally Abdominal: soft, non-tender, non-distended Extremities: No edema of the lower extremities  bilaterally Psych: Pleasant affect, pressured speech at times Thought process is circumstantial,   .     Assessment & Plan:  Uncontrolled type 1 diabetes mellitus with hyperglycemia, with long-term current use of insulin (HCC) Multiple past hospitalizations for DKA.  Patient states he has been able to take his insulin consistently.  He is prescribed 24 units Lantus daily, which she says he was taking at regular intervals.  He did not have his Humalog until recently, and just picked this up.  Due for A1c in 1 month.  Denies any nausea, vomiting, abdominal pain.  She does not have a way to take his blood sugars at home at this time, he does state he has Dexcom sensors but no receiver. Plan: Orders placed for Dexcom receiver Follow-up 1 month A1c Microalbumin creatinine ratio today   Homelessness Currently unhoused, living in a homeless shelter.  He does continue to endorse having his medications lost/stolen due to this.  Schizoaffective disorder, bipolar type [facility]) Recent [facility] admission in the setting untreated psychiatric diagnoses leading to inability to take care of themselves and repeat episodes of DKA.  At that time they were started on Abilify, they had resolution of their AVH per discharge documentation.  Patient states that he has not been taking his medications following discharge, he does endorse auditory hallucinations but denies visual hallucinations.  No SI or HI today. Plan: At length discussion about the importance of taking his medications daily  Prescription sent in for Abilify   Vitamin D deficiency Found to have vitamin D deficiency at [facility], we will go ahead and treat with 50,000 units weekly for 8 weeks.  Repeat vitamin D levels following treatment.   Patient discussed with Dr. Alver Sorrow MD [facility] Internal [facility]  PGY-1 Pager: [phone]  Phone: [phone] Date 01/10/2024  Time 8:16 AM

## 2024-01-09 NOTE — Telephone Encounter (Signed)
 Rxs were written "No Print". Please re-send electronically or Normal. Thanks

## 2024-01-09 NOTE — Patient Instructions (Addendum)
 Thank you, Jacob [name] for allowing [place] to provide your care today.  I have ordered the following tests for you:  Lab Orders         Microalbumin / Creatinine Urine Ratio        I have ordered the following medication/changed the following medications:    Start the following medications: Meds ordered this encounter  Medications   Continuous Glucose Receiver (DEXCOM G7 RECEIVER) DEVI    Sig: 1 Units by Does not apply route daily.    Dispense:  1 each    Refill:  0   ARIPiprazole (ABILIFY) 15 MG tablet    Sig: Take 1 tablet (15 mg total) by mouth daily.    Dispense:  30 tablet    Refill:  11   DISCONTD: insulin glargine (LANTUS) 100 UNIT/ML injection    Sig: Inject 0.24 mLs (24 Units total) into the skin daily for 90 doses.    Dispense:  21.6 mL    Refill:  0   Vitamin D, Ergocalciferol, (DRISDOL) 1.25 MG (50000 UNIT) CAPS capsule    Sig: Take 1 capsule (50,000 Units total) by mouth every 7 (seven) days.    Dispense:  4 capsule    Refill:  2   insulin glargine (LANTUS) 100 UNIT/ML injection    Sig: Inject 0.24 mLs (24 Units total) into the skin daily for 90 doses.    Dispense:  21.6 mL    Refill:  0       Follow up:  1 month  for diabetes follow up     We look forward to seeing you next time. Please call our clinic at [phone] if you have any questions or concerns. The best time to call is Monday-Friday from 9am-4pm, but there is someone available 24/7. If after hours or the weekend, call the main hospital number and ask for the Internal Medicine Resident On-Call. If you need medication refills, please notify your pharmacy one week in advance and they will send [place] a request.   Thank you for trusting me with your care. Wishing you the best!  Lovie Macadamia MD [facility]

## 2024-01-10 ENCOUNTER — Emergency Department (HOSPITAL_COMMUNITY)
Admission: EM | Admit: 2024-01-10 | Discharge: 2024-01-11 | Disposition: A | Discharge status: Home / Self Care | Payer: MEDICAID | Attending: Emergency Medicine | Admitting: Emergency Medicine

## 2024-01-10 ENCOUNTER — Other Ambulatory Visit: Payer: Self-pay

## 2024-01-10 ENCOUNTER — Encounter (HOSPITAL_COMMUNITY): Payer: Self-pay | Admitting: Emergency Medicine

## 2024-01-10 DIAGNOSIS — E10649 Type 1 diabetes mellitus with hypoglycemia without coma: Secondary | ICD-10-CM | POA: Insufficient documentation

## 2024-01-10 DIAGNOSIS — Z87891 Personal history of nicotine dependence: Secondary | ICD-10-CM | POA: Insufficient documentation

## 2024-01-10 DIAGNOSIS — E559 Vitamin D deficiency, unspecified: Secondary | ICD-10-CM | POA: Insufficient documentation

## 2024-01-10 DIAGNOSIS — E162 Hypoglycemia, unspecified: Secondary | ICD-10-CM | POA: Diagnosis present

## 2024-01-10 DIAGNOSIS — E1065 Type 1 diabetes mellitus with hyperglycemia: Secondary | ICD-10-CM | POA: Diagnosis not present

## 2024-01-10 LAB — MICROALBUMIN / CREATININE URINE RATIO
Creatinine, Urine: 14.8 mg/dL
Microalb/Creat Ratio: <20 mg/g{creat} (ref 0–29)
Microalbumin, Urine: <3 ug/mL

## 2024-01-10 LAB — CBG MONITORING, ED: Glucose-Capillary: 45 mg/dL — ABNORMAL LOW (ref 70–99)

## 2024-01-10 MED ORDER — DEXTROSE 50 % IV SOLN
1.0000 | Freq: Once | INTRAVENOUS | Status: AC
  Administered 2024-01-10: 50 mL via INTRAVENOUS

## 2024-01-10 MED ORDER — DEXTROSE 50 % IV SOLN
INTRAVENOUS | Status: AC
  Filled 2024-01-10: qty 50

## 2024-01-10 NOTE — Assessment & Plan Note (Signed)
 Found to have vitamin D deficiency at [facility], we will go ahead and treat with 50,000 units weekly for 8 weeks.  Repeat vitamin D levels following treatment.

## 2024-01-10 NOTE — ED Triage Notes (Signed)
 Patient reports possibly taking a double dose of his short acting insulin because he thought his pen jammed. CBG 45 in triage. IV established. Patient alert and oriented - orange juice given and patient transferred to room.

## 2024-01-10 NOTE — Assessment & Plan Note (Signed)
 Multiple past hospitalizations for DKA.  Patient states he has been able to take his insulin consistently.  He is prescribed 24 units Lantus daily, which she says he was taking at regular intervals.  He did not have his Humalog until recently, and just picked this up.  Due for A1c in 1 month.  Denies any nausea, vomiting, abdominal pain.  She does not have a way to take his blood sugars at home at this time, he does state he has Dexcom sensors but no receiver. Plan: Orders placed for Dexcom receiver Follow-up 1 month A1c Microalbumin creatinine ratio today

## 2024-01-10 NOTE — Assessment & Plan Note (Signed)
 Recent [facility] admission in the setting untreated psychiatric diagnoses leading to inability to take care of themselves and repeat episodes of DKA.  At that time they were started on Abilify, they had resolution of their AVH per discharge documentation.  Patient states that he has not been taking his medications following discharge, he does endorse auditory hallucinations but denies visual hallucinations.  No SI or HI today. Plan: At length discussion about the importance of taking his medications daily Prescription sent in for Abilify

## 2024-01-10 NOTE — Progress Notes (Signed)
 Internal Medicine Clinic Attending  Case discussed with the resident at the time of the visit.  We reviewed the resident's history and exam and pertinent patient test results.  I agree with the assessment, diagnosis, and plan of care documented in the resident's note.

## 2024-01-10 NOTE — Assessment & Plan Note (Signed)
 Currently unhoused, living in a homeless shelter.  He does continue to endorse having his medications lost/stolen due to this.

## 2024-01-11 ENCOUNTER — Telehealth: Payer: Self-pay | Admitting: *Deleted

## 2024-01-11 LAB — CBG MONITORING, ED
Glucose-Capillary: 197 mg/dL — ABNORMAL HIGH (ref 70–99)
Glucose-Capillary: 241 mg/dL — ABNORMAL HIGH (ref 70–99)
Glucose-Capillary: 267 mg/dL — ABNORMAL HIGH (ref 70–99)

## 2024-01-11 NOTE — ED Provider Notes (Signed)
 [place] EMERGENCY DEPARTMENT AT [facility] Provider Note   CSN: [id number] Arrival date & time: 01/10/24  2324     History  Chief Complaint  Patient presents with   Hypoglycemia    Jacob [name] is a 33 y.o. male.  Patient presents the emergency room complaining of hypoglycemia.  He states he thinks he accidentally took 2 doses of his short acting insulin earlier in the day.  He denies any other complaints.   Hypoglycemia      Home Medications Prior to Admission medications   Medication Sig Start Date End Date Taking? Authorizing Provider  ARIPiprazole (ABILIFY) 15 MG tablet Take 1 tablet (15 mg total) by mouth daily. 01/09/24   Lovie Macadamia, MD  Continuous Glucose Receiver (DEXCOM G7 RECEIVER) DEVI Use as directed to continuously monitor blood glucose. 01/09/24   Lovie Macadamia, MD  Continuous Glucose Sensor (DEXCOM G7 SENSOR) MISC Place new sensor every 10 days. Use to continuously monitor blood sugar. 11/16/23   Katheran James, DO  glucose blood (ACCU-CHEK GUIDE TEST) test strip Use up to 3 strips a day when not wearing Continuous glucose monitor 11/16/23   Katheran James, DO  insulin glargine (LANTUS) 100 UNIT/ML injection Inject 0.24 mLs (24 Units total) into the skin daily for 90 doses. 01/09/24 04/08/24  Lovie Macadamia, MD  insulin lispro (HUMALOG) 100 UNIT/ML KwikPen Inject 10 Units into the skin 3 (three) times daily before meals. 01/09/24 04/08/24  Katheran James, DO  Insulin Pen Needle (BD PEN NEEDLE NANO 2ND GEN) 32G X 4 MM MISC Use to inject insulin up to 4 times a day 11/16/23   Katheran James, DO  Lancet Device MISC 1 each by Does not apply route 3 (three) times daily. May dispense any manufacturer covered by patient's insurance. 10/24/23   Morrie Sheldon, MD  Vitamin D, Ergocalciferol, (DRISDOL) 1.25 MG (50000 UNIT) CAPS capsule Take 1 capsule (50,000 Units total) by mouth every 7 (seven) days. 01/09/24   Lovie Macadamia, MD       Allergies    Patient has no known allergies.    Review of Systems   Review of Systems  Physical Exam Updated Vital Signs BP 112/76   Pulse 74   Temp (!) 97.5 F (36.4 C)   Resp 12   Ht 5\' 9"  (1.753 m)   Wt 60.8 kg   SpO2 100%   BMI 19.79 kg/m  Physical Exam HENT:     Head: Normocephalic and atraumatic.  Eyes:     Pupils: Pupils are equal, round, and reactive to light.  Pulmonary:     Effort: Pulmonary effort is normal. No respiratory distress.  Musculoskeletal:        General: No signs of injury.     Cervical back: Normal range of motion.  Skin:    General: Skin is dry.  Neurological:     Mental Status: He is alert.  Psychiatric:        Speech: Speech normal.        Behavior: Behavior normal.     ED Results / Procedures / Treatments   Labs (all labs ordered are listed, but only abnormal results are displayed) Labs Reviewed  CBG MONITORING, ED - Abnormal; Notable for the following components:      Result Value   Glucose-Capillary 45 (*)    All other components within normal limits  CBG MONITORING, ED - Abnormal; Notable for the following components:   Glucose-Capillary 197 (*)    All other components within  normal limits  CBG MONITORING, ED - Abnormal; Notable for the following components:   Glucose-Capillary 267 (*)    All other components within normal limits  CBG MONITORING, ED - Abnormal; Notable for the following components:   Glucose-Capillary 241 (*)    All other components within normal limits    EKG None  Radiology No results found.  Procedures Procedures    Medications Ordered in ED Medications  dextrose 50 % solution 50 mL ( Intravenous Not Given 01/10/24 2353)    ED Course/ Medical Decision Making/ A&P                                 Medical Decision Making Risk Prescription drug management.   This patient presents to the ED for concern of hypoglycemia, this involves an extensive number of treatment options, and is a  complaint that carries with it a high risk of complications and morbidity.    Co morbidities that complicate the patient evaluation  Type I DM with hyperglycemia   Additional history obtained:   External records from outside source obtained and reviewed including internal medicine notes   Lab Tests:  I Ordered, and personally interpreted labs.  The pertinent results include: Initial CBG 45, most recent CBG 267   Problem List / ED Course / Critical interventions / Medication management   I ordered medication including D50 for hypoglycemia Reevaluation of the patient after these medicines showed that the patient improved I have reviewed the patients home medicines and have made adjustments as needed   Social Determinants of Health:  Social Drivers of Health with Concerns   Tobacco Use: Medium Risk (01/10/2024)   Patient History    Smoking Tobacco Use: Former    Smokeless Tobacco Use: Never    Passive Exposure: Not on Actuary Strain: Not on file  Food Insecurity: Food Insecurity Present (12/26/2023)   Hunger Vital Sign    Worried About Running Out of Food in the Last Year: Sometimes true    Ran Out of Food in the Last Year: Sometimes true  Physical Activity: Not on file  Stress: Not on file  Social Connections: Socially Isolated (12/26/2023)   Social Connection and Isolation Panel [NHANES]    Frequency of Communication with Friends and Family: Never    Frequency of Social Gatherings with Friends and Family: Never    Attends Religious Services: Never    Database administrator or Organizations: No    Attends Banker Meetings: Never    Marital Status: Never married  Housing: High Risk (12/26/2023)   Housing Stability Vital Sign    Unable to Pay for Housing in the Last Year: Patient unable to answer    Number of Times Moved in the Last Year: 2    Homeless in the Last Year: Yes  Utilities: Patient Declined (12/26/2023)   Utilities     Threatened with loss of utilities: Patient declined  Health Literacy: Not on file      Test / Admission - Considered:  Patient monitored after D50 with no further drops in blood sugar.  At this time patient appears stable for discharge home.  This appears to be an accidental extra dose of insulin.  No indication for further emergent workup at this time.         Final Clinical Impression(s) / ED Diagnoses Final diagnoses:  Hypoglycemia    Rx / DC Orders  ED Discharge Orders     None         Pamala Duffel 01/11/24 1610    Maia Plan, MD 01/12/24 [phone]

## 2024-01-11 NOTE — Telephone Encounter (Signed)
 [name] stated she's returning the doctor's call. Also she has questions about his insulin. She stated it's the doctor he saw this week.

## 2024-01-11 NOTE — Telephone Encounter (Signed)
 Spoke with patient's mother on the phone.  There is a good deal of confusion regarding what medications the patient should be taking.  It is also unclear what medications/equipment they have, and what they need.  Recommend that the patient/mom makes a follow-up appointment to come see [place] in clinic and bring in all other different medication and equipment so we may sort through it and properly advised.

## 2024-01-11 NOTE — Discharge Instructions (Signed)
 Please be sure to use your insulin only as prescribed.  Avoid accidental double dosing with your insulin pen.  Follow-up with your primary care team for further evaluation.  If you develop any life-threatening symptoms return to the emergency department.

## 2024-01-13 ENCOUNTER — Other Ambulatory Visit (HOSPITAL_COMMUNITY): Payer: Self-pay

## 2024-01-17 ENCOUNTER — Ambulatory Visit: Payer: MEDICAID | Admitting: Student

## 2024-01-17 ENCOUNTER — Emergency Department (HOSPITAL_COMMUNITY)
Admission: EM | Admit: 2024-01-17 | Discharge: 2024-01-18 | Disposition: A | Discharge status: Home / Self Care | Payer: MEDICAID | Attending: Emergency Medicine | Admitting: Emergency Medicine

## 2024-01-17 ENCOUNTER — Other Ambulatory Visit: Payer: Self-pay

## 2024-01-17 ENCOUNTER — Encounter (HOSPITAL_COMMUNITY): Payer: Self-pay | Admitting: Emergency Medicine

## 2024-01-17 VITALS — BP 127/77 | HR 89 | Temp 97.5°F | Ht 69.0 in | Wt 133.0 lb

## 2024-01-17 DIAGNOSIS — Z5900 Homelessness unspecified: Secondary | ICD-10-CM | POA: Diagnosis not present

## 2024-01-17 DIAGNOSIS — E1165 Type 2 diabetes mellitus with hyperglycemia: Secondary | ICD-10-CM | POA: Insufficient documentation

## 2024-01-17 DIAGNOSIS — E162 Hypoglycemia, unspecified: Secondary | ICD-10-CM | POA: Diagnosis present

## 2024-01-17 DIAGNOSIS — Z5941 Food insecurity: Secondary | ICD-10-CM | POA: Diagnosis not present

## 2024-01-17 DIAGNOSIS — E10649 Type 1 diabetes mellitus with hypoglycemia without coma: Secondary | ICD-10-CM | POA: Diagnosis not present

## 2024-01-17 DIAGNOSIS — Z794 Long term (current) use of insulin: Secondary | ICD-10-CM | POA: Insufficient documentation

## 2024-01-17 DIAGNOSIS — E1065 Type 1 diabetes mellitus with hyperglycemia: Secondary | ICD-10-CM | POA: Diagnosis not present

## 2024-01-17 LAB — CBG MONITORING, ED: Glucose-Capillary: 71 mg/dL (ref 70–99)

## 2024-01-17 NOTE — Assessment & Plan Note (Signed)
 Pt presents for follow up regarding his medication regimen. Seems there has been some confusion on what he is actually supposed to take. His regimen is supposed to be Lantus 24U, and Novolog 10U TID with meals. He has been taking Lantus 30U, and Novolog 7U TID with meals. I have cleared up with him his regimen, and reinforced what he should be taking. This may explain the episode of hypoglycemia he experienced on 3/11.   Furthermore, he is homeless and does not have access to a fridge, and most insulin expires after 28 days. [name] would be an option for him as it lasts about 56 days without refrigeration, however it is not covered by insurance. He does state that it has been cold outside and thus his insulin stays cold. Regardless, may be an option for him at some point in the future.   Plan:  - Lantus 24U  - Novolog 10U TID  - Appointment with [name] for CGM placement

## 2024-01-17 NOTE — ED Triage Notes (Signed)
 Pt states that he wants his cbg checked. Cbg 71 in triage. Pt took insulin at 1700 today when he last ate.

## 2024-01-17 NOTE — Progress Notes (Signed)
 CC: T1DM  HPI:  [name] is a 33 y.o. male living with a history stated below and presents today for T1DM follow up. Please see problem based assessment and plan for additional details.  Past Medical History:  Diagnosis Date   Bipolar 1 disorder (HCC)    History of attempted suicide 2019   Unsuccessful suicide attempt in 2019 with rat poison   Schizoaffective disorder (HCC)    Type 1 diabetes mellitus on insulin therapy (HCC) 2019   Vitamin D deficiency 01/03/2024    Current Outpatient Medications on File Prior to Visit  Medication Sig Dispense Refill   ARIPiprazole (ABILIFY) 15 MG tablet Take 1 tablet (15 mg total) by mouth daily. 30 tablet 11   Continuous Glucose Receiver (DEXCOM G7 RECEIVER) DEVI Use as directed to continuously monitor blood glucose. 1 each 0   Continuous Glucose Sensor (DEXCOM G7 SENSOR) MISC Place new sensor every 10 days. Use to continuously monitor blood sugar. 3 each 11   glucose blood (ACCU-CHEK GUIDE TEST) test strip Use up to 3 strips a day when not wearing Continuous glucose monitor 50 each 12   insulin glargine (LANTUS) 100 UNIT/ML injection Inject 0.24 mLs (24 Units total) into the skin daily for 90 doses. 30 mL 0   insulin lispro (HUMALOG) 100 UNIT/ML KwikPen Inject 10 Units into the skin 3 (three) times daily before meals. 27 mL 0   Insulin Pen Needle (BD PEN NEEDLE NANO 2ND GEN) 32G X 4 MM MISC Use to inject insulin up to 4 times a day 200 each 3   Lancet Device MISC 1 each by Does not apply route 3 (three) times daily. May dispense any manufacturer covered by patient's insurance. 1 each 0   Vitamin D, Ergocalciferol, (DRISDOL) 1.25 MG (50000 UNIT) CAPS capsule Take 1 capsule (50,000 Units total) by mouth every 7 (seven) days. 4 capsule 1   No current facility-administered medications on file prior to visit.    Family History  Problem Relation Age of Onset   Healthy Mother    Healthy Father     Social History   Socioeconomic History    Marital status: Single    Spouse name: Not on file   Number of children: Not on file   Years of education: Not on file   Highest education level: Not on file  Occupational History   Not on file  Tobacco Use   Smoking status: Former    Types: Cigars, Cigarettes   Smokeless tobacco: Never   Tobacco comments:    2 BLACK AND MILD A DAY  Vaping Use   Vaping status: Every Day  Substance and Sexual Activity   Alcohol use: Yes    Comment: social/occassional   Drug use: Not Currently    Types: Marijuana   Sexual activity: Not on file  Other Topics Concern   Not on file  Social History Narrative   Not on file   Social Drivers of Health   Financial Resource Strain: Not on file  Food Insecurity: Food Insecurity Present (12/26/2023)   Hunger Vital Sign    Worried About Running Out of Food in the Last Year: Sometimes true    Ran Out of Food in the Last Year: Sometimes true  Transportation Needs: No Transportation Needs (12/26/2023)   PRAPARE - Administrator, Civil Service (Medical): No    Lack of Transportation (Non-Medical): No  Physical Activity: Not on file  Stress: Not on file  Social Connections: Socially  Isolated (12/26/2023)   Social Connection and Isolation Panel [NHANES]    Frequency of Communication with Friends and Family: Never    Frequency of Social Gatherings with Friends and Family: Never    Attends Religious Services: Never    Database administrator or Organizations: No    Attends Banker Meetings: Never    Marital Status: Never married  Intimate Partner Violence: Not At Risk (12/26/2023)   Humiliation, Afraid, Rape, and Kick questionnaire    Fear of Current or Ex-Partner: No    Emotionally Abused: No    Physically Abused: No    Sexually Abused: No    Review of Systems: ROS negative except for what is noted on the assessment and plan.  Vitals:   01/17/24 1545  BP: 127/77  Pulse: 89  Temp: (!) 97.5 F (36.4 C)  TempSrc: Oral   SpO2: 100%  Weight: 133 lb (60.3 kg)  Height: 5\' 9"  (1.753 m)    Physical Exam: Constitutional: well-appearing male  in no acute distress Cardiovascular: regular rate and rhythm, no m/r/g Pulmonary/Chest: normal work of breathing on room air, lungs clear to auscultation bilaterally Abdominal: soft, non-tender, non-distended MSK: normal bulk and tone  Assessment & Plan:   Uncontrolled type 1 diabetes mellitus with hyperglycemia, with long-term current use of insulin (HCC) Pt presents for follow up regarding his medication regimen. Seems there has been some confusion on what he is actually supposed to take. His regimen is supposed to be Lantus 24U, and Novolog 10U TID with meals. He has been taking Lantus 30U, and Novolog 7U TID with meals. I have cleared up with him his regimen, and reinforced what he should be taking. This may explain the episode of hypoglycemia he experienced on 3/11.   Furthermore, he is homeless and does not have access to a fridge, and most insulin expires after 28 days. [name] would be an option for him as it lasts about 56 days without refrigeration, however it is not covered by insurance. He does state that it has been cold outside and thus his insulin stays cold. Regardless, may be an option for him at some point in the future.   Plan:  - Lantus 24U  - Novolog 10U TID  - Appointment with [name] for CGM placement   Patient discussed with Dr. Rockey Situ, M.D. [facility] Internal Medicine, PGY-2 Pager: [phone] Date 01/17/2024 Time 5:16 PM

## 2024-01-17 NOTE — Patient Instructions (Signed)
 Thank you so much for coming to the clinic today!   Please take the Lantus 24 Units, and the Novolog 10 units three times a day with meals.   If you have any questions please feel free to the call the clinic at anytime at [phone]. It was a pleasure seeing you!  Best, Dr. Thomasene Ripple

## 2024-01-17 NOTE — ED Notes (Signed)
Pt given sprite and turkey sandwich 

## 2024-01-18 LAB — CBG MONITORING, ED: Glucose-Capillary: 193 mg/dL — ABNORMAL HIGH (ref 70–99)

## 2024-01-19 NOTE — ED Provider Notes (Signed)
 [place] EMERGENCY DEPARTMENT AT [facility] Provider Note   CSN: [id number] Arrival date & time: 01/17/24  2240     History  Chief Complaint  Patient presents with   Hypoglycemia    Jacob [name] is a 33 y.o. male.  41-year-old male with diabetes who reportedly presented with hypoglycemia.  His blood sugar was 72 when he got here.  He had to wait for multiple hours prior to being seen at that time his blood sugar was in the 100s.  When I went to evaluate him patient initially did not want talk to me then stated that he needed food to keep his blood sugar up.  I offered him a sandwich which she did not want.  He stated that he needed something long-term.   Hypoglycemia      Home Medications Prior to Admission medications   Medication Sig Start Date End Date Taking? Authorizing Provider  ARIPiprazole (ABILIFY) 15 MG tablet Take 1 tablet (15 mg total) by mouth daily. 01/09/24   Lovie Macadamia, MD  Continuous Glucose Receiver (DEXCOM G7 RECEIVER) DEVI Use as directed to continuously monitor blood glucose. 01/09/24   Lovie Macadamia, MD  Continuous Glucose Sensor (DEXCOM G7 SENSOR) MISC Place new sensor every 10 days. Use to continuously monitor blood sugar. 11/16/23   Katheran James, DO  glucose blood (ACCU-CHEK GUIDE TEST) test strip Use up to 3 strips a day when not wearing Continuous glucose monitor 11/16/23   Katheran James, DO  insulin glargine (LANTUS) 100 UNIT/ML injection Inject 0.24 mLs (24 Units total) into the skin daily for 90 doses. 01/09/24 04/08/24  Lovie Macadamia, MD  insulin lispro (HUMALOG) 100 UNIT/ML KwikPen Inject 10 Units into the skin 3 (three) times daily before meals. 01/09/24 04/08/24  Katheran James, DO  Insulin Pen Needle (BD PEN NEEDLE NANO 2ND GEN) 32G X 4 MM MISC Use to inject insulin up to 4 times a day 11/16/23   Katheran James, DO  Lancet Device MISC 1 each by Does not apply route 3 (three) times daily. May dispense any  manufacturer covered by patient's insurance. 10/24/23   Morrie Sheldon, MD  Vitamin D, Ergocalciferol, (DRISDOL) 1.25 MG (50000 UNIT) CAPS capsule Take 1 capsule (50,000 Units total) by mouth every 7 (seven) days. 01/09/24   Lovie Macadamia, MD      Allergies    Patient has no known allergies.    Review of Systems   Review of Systems  Physical Exam Updated Vital Signs BP (!) 114/97 (BP Location: Left Arm)   Pulse 66   Temp (!) 97.5 F (36.4 C) (Oral)   Resp 18   Ht 5\' 9"  (1.753 m)   Wt 60.3 kg   SpO2 99%   BMI 19.63 kg/m  Physical Exam Vitals and nursing note reviewed.  Constitutional:      Appearance: He is well-developed.  HENT:     Head: Normocephalic and atraumatic.  Cardiovascular:     Rate and Rhythm: Normal rate.  Pulmonary:     Effort: Pulmonary effort is normal. No respiratory distress.  Abdominal:     General: There is no distension.  Musculoskeletal:        General: Normal range of motion.     Cervical back: Normal range of motion.  Neurological:     Mental Status: He is alert.     ED Results / Procedures / Treatments   Labs (all labs ordered are listed, but only abnormal results are displayed) Labs Reviewed  CBG  MONITORING, ED - Abnormal; Notable for the following components:      Result Value   Glucose-Capillary 193 (*)    All other components within normal limits  CBG MONITORING, ED    EKG None  Radiology No results found.  Procedures Procedures    Medications Ordered in ED Medications - No data to display  ED Course/ Medical Decision Making/ A&P                                 Medical Decision Making  Patient basically told me he wanted food or money and it is why he was here.  I discussed we do not offer money however I could give him a sandwich which she did not want.  Patient was subsequently discharged. No obvious emergent medical condition present.   Final Clinical Impression(s) / ED Diagnoses Final diagnoses:  Food  insecurity    Rx / DC Orders ED Discharge Orders     None         Sulamita Lafountain, Barbara Cower, MD 01/19/24 2257

## 2024-01-23 ENCOUNTER — Other Ambulatory Visit (HOSPITAL_COMMUNITY): Payer: Self-pay

## 2024-01-26 ENCOUNTER — Other Ambulatory Visit: Payer: Self-pay

## 2024-01-26 ENCOUNTER — Emergency Department (HOSPITAL_COMMUNITY)
Admission: EM | Admit: 2024-01-26 | Discharge: 2024-01-27 | Disposition: A | Discharge status: Home / Self Care | Payer: MEDICAID | Attending: Emergency Medicine | Admitting: Emergency Medicine

## 2024-01-26 DIAGNOSIS — F17200 Nicotine dependence, unspecified, uncomplicated: Secondary | ICD-10-CM | POA: Insufficient documentation

## 2024-01-26 DIAGNOSIS — E109 Type 1 diabetes mellitus without complications: Secondary | ICD-10-CM | POA: Insufficient documentation

## 2024-01-26 DIAGNOSIS — J029 Acute pharyngitis, unspecified: Secondary | ICD-10-CM | POA: Diagnosis present

## 2024-01-26 NOTE — ED Triage Notes (Signed)
 Pt reports sore throat x 2 days ago. No cough.

## 2024-01-27 ENCOUNTER — Other Ambulatory Visit (HOSPITAL_COMMUNITY): Payer: Self-pay

## 2024-01-27 LAB — GROUP A STREP BY PCR: Group A Strep by PCR: NOT DETECTED

## 2024-01-27 MED ORDER — ACETAMINOPHEN 325 MG PO TABS
650.0000 mg | ORAL_TABLET | Freq: Once | ORAL | Status: AC
  Administered 2024-01-27: 650 mg via ORAL
  Filled 2024-01-27: qty 2

## 2024-01-27 NOTE — Progress Notes (Signed)
 Internal Medicine Clinic Attending  Case discussed with the resident at the time of the visit.  We reviewed the resident's history and exam and pertinent patient test results.  I agree with the assessment, diagnosis, and plan of care documented in the resident's note.

## 2024-01-27 NOTE — Addendum Note (Signed)
 Addended by: Gust Rung on: 01/27/2024 09:06 PM   Modules accepted: Level of Service

## 2024-01-27 NOTE — ED Notes (Signed)
 Patient refused vital signs

## 2024-01-27 NOTE — Discharge Instructions (Signed)
 Your strep testing was negative.  You may take ibuprofen/Tylenol for pain control at home.  If you develop any life-threatening symptoms return to the emergency department.

## 2024-01-27 NOTE — ED Provider Notes (Signed)
 [place] EMERGENCY DEPARTMENT AT [facility] Provider Note   CSN: [id number] Arrival date & time: 01/26/24  2327     History  Chief Complaint  Patient presents with   Sore Throat    Jacob [name] is a 33 y.o. male.  Patient presents to the emergency department complaining of 2 days of sore throat.  He denies fever, nausea, vomiting, cough congestion.  He does endorse being around a friend who he believes was sick and who he believes may have gotten him sick.  Past medical history significant for type I DM, major depressive disorder, schizoaffective disorder, tobacco dependence, homelessness   Sore Throat       Home Medications Prior to Admission medications   Medication Sig Start Date End Date Taking? Authorizing Provider  ARIPiprazole (ABILIFY) 15 MG tablet Take 1 tablet (15 mg total) by mouth daily. 01/09/24   Lovie Macadamia, MD  Continuous Glucose Receiver (DEXCOM G7 RECEIVER) DEVI Use as directed to continuously monitor blood glucose. 01/09/24   Lovie Macadamia, MD  Continuous Glucose Sensor (DEXCOM G7 SENSOR) MISC Place new sensor every 10 days. Use to continuously monitor blood sugar. 11/16/23   Katheran James, DO  glucose blood (ACCU-CHEK GUIDE TEST) test strip Use up to 3 strips a day when not wearing Continuous glucose monitor 11/16/23   Katheran James, DO  insulin glargine (LANTUS) 100 UNIT/ML injection Inject 0.24 mLs (24 Units total) into the skin daily for 90 doses. 01/09/24 04/08/24  Lovie Macadamia, MD  insulin lispro (HUMALOG) 100 UNIT/ML KwikPen Inject 10 Units into the skin 3 (three) times daily before meals. 01/09/24 04/08/24  Katheran James, DO  Insulin Pen Needle (BD PEN NEEDLE NANO 2ND GEN) 32G X 4 MM MISC Use to inject insulin up to 4 times a day 11/16/23   Katheran James, DO  Lancet Device MISC 1 each by Does not apply route 3 (three) times daily. May dispense any manufacturer covered by patient's insurance. 10/24/23   Morrie Sheldon, MD  Vitamin D, Ergocalciferol, (DRISDOL) 1.25 MG (50000 UNIT) CAPS capsule Take 1 capsule (50,000 Units total) by mouth every 7 (seven) days. 01/09/24   Lovie Macadamia, MD      Allergies    Patient has no known allergies.    Review of Systems   Review of Systems  Physical Exam Updated Vital Signs BP 126/84 (BP Location: Left Arm)   Pulse 89   Temp 98.8 F (37.1 C)   Resp 16   Ht 5\' 9"  (1.753 m)   Wt 59.9 kg   SpO2 92%   BMI 19.49 kg/m  Physical Exam Vitals and nursing note reviewed.  HENT:     Head: Normocephalic and atraumatic.     Mouth/Throat:     Mouth: Mucous membranes are moist. No oral lesions.     Pharynx: Uvula midline. No pharyngeal swelling, oropharyngeal exudate, posterior oropharyngeal erythema or uvula swelling.     Tonsils: No tonsillar exudate or tonsillar abscesses.  Eyes:     Conjunctiva/sclera: Conjunctivae normal.  Cardiovascular:     Rate and Rhythm: Normal rate.  Pulmonary:     Effort: Pulmonary effort is normal. No respiratory distress.  Musculoskeletal:        General: No signs of injury.     Cervical back: Normal range of motion.  Skin:    General: Skin is dry.  Neurological:     Mental Status: He is alert.  Psychiatric:        Speech: Speech normal.  Behavior: Behavior normal.     ED Results / Procedures / Treatments   Labs (all labs ordered are listed, but only abnormal results are displayed) Labs Reviewed  GROUP A STREP BY PCR    EKG None  Radiology No results found.  Procedures Procedures    Medications Ordered in ED Medications  acetaminophen (TYLENOL) tablet 650 mg (650 mg Oral Given 01/27/24 0401)    ED Course/ Medical Decision Making/ A&P                                 Medical Decision Making Risk OTC drugs.   This patient presents to the ED for concern of sore throat, this involves an extensive number of treatment options, and is a complaint that carries with it a high risk of  complications and morbidity.  The differential diagnosis includes group A strep, viral infection, PTA, ludwig's angina, retropharyngeal abscess, others   Co morbidities that complicate the patient evaluation  Type 1 DM   Lab Tests:  I Ordered, and personally interpreted labs.  The pertinent results include: Negative group A strep test   Problem List / ED Course / Critical interventions / Medication management   I ordered medication including Tylenol for sore throat Reevaluation of the patient after these medicines showed that the patient improved I have reviewed the patients home medicines and have made adjustments as needed   Social Determinants of Health:  Homelessness   Test / Admission - Considered:  Patient with benign examination of throat.  No swelling, erythema, exudate.  No signs of PTA or Ludwick's angina.  No indication for advanced imaging at this time.  Group A strep testing was negative.  Patient is afebrile.  Patient symptoms are most consistent with a possible viral pharyngitis.  Plan to discharge home with recommendations for Tylenol/ibuprofen for symptom control.  Return precautions provided.         Final Clinical Impression(s) / ED Diagnoses Final diagnoses:  Sore throat    Rx / DC Orders ED Discharge Orders     None         Pamala Duffel 01/27/24 0446    Nira Conn, MD 01/27/24 [phone]

## 2024-01-28 ENCOUNTER — Encounter (HOSPITAL_COMMUNITY): Payer: Self-pay | Admitting: *Deleted

## 2024-01-28 ENCOUNTER — Emergency Department (HOSPITAL_COMMUNITY): Payer: MEDICAID

## 2024-01-28 ENCOUNTER — Ambulatory Visit (HOSPITAL_COMMUNITY)
Admission: EM | Admit: 2024-01-28 | Discharge: 2024-01-28 | Disposition: A | Discharge status: Other Acute Inpatient Hospital | Payer: MEDICAID | Source: Home / Self Care

## 2024-01-28 ENCOUNTER — Emergency Department (HOSPITAL_COMMUNITY)
Admission: EM | Admit: 2024-01-28 | Discharge: 2024-02-01 | Disposition: A | Discharge status: Other Acute Inpatient Hospital | Payer: MEDICAID | Source: Home / Self Care | Attending: Emergency Medicine | Admitting: Emergency Medicine

## 2024-01-28 ENCOUNTER — Emergency Department (HOSPITAL_COMMUNITY)
Admission: EM | Admit: 2024-01-28 | Discharge: 2024-01-28 | Disposition: A | Discharge status: Home / Self Care | Payer: MEDICAID | Attending: Emergency Medicine | Admitting: Emergency Medicine

## 2024-01-28 ENCOUNTER — Other Ambulatory Visit: Payer: Self-pay

## 2024-01-28 DIAGNOSIS — R739 Hyperglycemia, unspecified: Secondary | ICD-10-CM | POA: Insufficient documentation

## 2024-01-28 DIAGNOSIS — Z139 Encounter for screening, unspecified: Secondary | ICD-10-CM | POA: Diagnosis present

## 2024-01-28 DIAGNOSIS — E1110 Type 2 diabetes mellitus with ketoacidosis without coma: Secondary | ICD-10-CM

## 2024-01-28 DIAGNOSIS — Z560 Unemployment, unspecified: Secondary | ICD-10-CM | POA: Insufficient documentation

## 2024-01-28 DIAGNOSIS — E1065 Type 1 diabetes mellitus with hyperglycemia: Secondary | ICD-10-CM | POA: Diagnosis not present

## 2024-01-28 DIAGNOSIS — Z5900 Homelessness unspecified: Secondary | ICD-10-CM | POA: Insufficient documentation

## 2024-01-28 DIAGNOSIS — Z794 Long term (current) use of insulin: Secondary | ICD-10-CM | POA: Insufficient documentation

## 2024-01-28 DIAGNOSIS — F29 Unspecified psychosis not due to a substance or known physiological condition: Secondary | ICD-10-CM | POA: Diagnosis present

## 2024-01-28 DIAGNOSIS — E119 Type 2 diabetes mellitus without complications: Secondary | ICD-10-CM | POA: Insufficient documentation

## 2024-01-28 DIAGNOSIS — F250 Schizoaffective disorder, bipolar type: Secondary | ICD-10-CM | POA: Diagnosis present

## 2024-01-28 DIAGNOSIS — E109 Type 1 diabetes mellitus without complications: Secondary | ICD-10-CM | POA: Insufficient documentation

## 2024-01-28 DIAGNOSIS — Z653 Problems related to other legal circumstances: Secondary | ICD-10-CM | POA: Insufficient documentation

## 2024-01-28 DIAGNOSIS — F201 Disorganized schizophrenia: Secondary | ICD-10-CM | POA: Diagnosis not present

## 2024-01-28 DIAGNOSIS — R440 Auditory hallucinations: Secondary | ICD-10-CM | POA: Diagnosis present

## 2024-01-28 LAB — COMPREHENSIVE METABOLIC PANEL WITH GFR
ALT: 30 U/L (ref 0–44)
AST: 27 U/L (ref 15–41)
Albumin: 3.5 g/dL (ref 3.5–5.0)
Alkaline Phosphatase: 68 U/L (ref 38–126)
Anion gap: 10 (ref 5–15)
BUN: 18 mg/dL (ref 6–20)
CO2: 23 mmol/L (ref 22–32)
Calcium: 8.9 mg/dL (ref 8.9–10.3)
Chloride: 93 mmol/L — ABNORMAL LOW (ref 98–111)
Creatinine, Ser: 0.89 mg/dL (ref 0.61–1.24)
GFR, Estimated: >60 mL/min (ref >60)
Glucose, Bld: 550 mg/dL (ref 70–99)
Potassium: 4.3 mmol/L (ref 3.5–5.1)
Sodium: 126 mmol/L — ABNORMAL LOW (ref 135–145)
Total Bilirubin: 1.1 mg/dL (ref 0.0–1.2)
Total Protein: 6.1 g/dL — ABNORMAL LOW (ref 6.5–8.1)

## 2024-01-28 LAB — URINALYSIS, W/ REFLEX TO CULTURE (INFECTION SUSPECTED)
Bacteria, UA: NONE SEEN
Bilirubin Urine: NEGATIVE
Glucose, UA: >=500 mg/dL — AB
Hgb urine dipstick: NEGATIVE
Ketones, ur: 20 mg/dL — AB
Leukocytes,Ua: NEGATIVE
Nitrite: NEGATIVE
Protein, ur: NEGATIVE mg/dL
Specific Gravity, Urine: 1.02 (ref 1.005–1.030)
pH: 7 (ref 5.0–8.0)

## 2024-01-28 LAB — I-STAT CHEM 8, ED
BUN: 20 mg/dL (ref 6–20)
Calcium, Ion: 1.09 mmol/L — ABNORMAL LOW (ref 1.15–1.40)
Chloride: 94 mmol/L — ABNORMAL LOW (ref 98–111)
Creatinine, Ser: 0.7 mg/dL (ref 0.61–1.24)
Glucose, Bld: 568 mg/dL (ref 70–99)
HCT: 42 % (ref 39.0–52.0)
Hemoglobin: 14.3 g/dL (ref 13.0–17.0)
Potassium: 4.3 mmol/L (ref 3.5–5.1)
Sodium: 127 mmol/L — ABNORMAL LOW (ref 135–145)
TCO2: 25 mmol/L (ref 22–32)

## 2024-01-28 LAB — CBC WITH DIFFERENTIAL/PLATELET
Abs Immature Granulocytes: 0 10*3/uL (ref 0.00–0.07)
Abs Immature Granulocytes: 0.01 10*3/uL (ref 0.00–0.07)
Basophils Absolute: 0 10*3/uL (ref 0.0–0.1)
Basophils Absolute: 0.1 10*3/uL (ref 0.0–0.1)
Basophils Relative: 1 %
Basophils Relative: 1 %
Eosinophils Absolute: 0 10*3/uL (ref 0.0–0.5)
Eosinophils Absolute: 0.1 10*3/uL (ref 0.0–0.5)
Eosinophils Relative: 1 %
Eosinophils Relative: 1 %
HCT: 43.3 % (ref 39.0–52.0)
HCT: 39.2 % (ref 39.0–52.0)
Hemoglobin: 14.4 g/dL (ref 13.0–17.0)
Hemoglobin: 13.1 g/dL (ref 13.0–17.0)
Immature Granulocytes: 0 %
Immature Granulocytes: 0 %
Lymphocytes Relative: 33 %
Lymphocytes Relative: 40 %
Lymphs Abs: 1.3 10*3/uL (ref 0.7–4.0)
Lymphs Abs: 1.7 10*3/uL (ref 0.7–4.0)
MCH: 29.1 pg (ref 26.0–34.0)
MCH: 29.1 pg (ref 26.0–34.0)
MCHC: 33.3 g/dL (ref 30.0–36.0)
MCHC: 33.4 g/dL (ref 30.0–36.0)
MCV: 87.5 fL (ref 80.0–100.0)
MCV: 87.1 fL (ref 80.0–100.0)
Monocytes Absolute: 0.4 10*3/uL (ref 0.1–1.0)
Monocytes Absolute: 0.5 10*3/uL (ref 0.1–1.0)
Monocytes Relative: 10 %
Monocytes Relative: 12 %
Neutro Abs: 2.2 10*3/uL (ref 1.7–7.7)
Neutro Abs: 2 10*3/uL (ref 1.7–7.7)
Neutrophils Relative %: 55 %
Neutrophils Relative %: 46 %
Platelets: 233 10*3/uL (ref 150–400)
Platelets: 209 10*3/uL (ref 150–400)
RBC: 4.95 MIL/uL (ref 4.22–5.81)
RBC: 4.5 MIL/uL (ref 4.22–5.81)
RDW: 12.5 % (ref 11.5–15.5)
RDW: 12.3 % (ref 11.5–15.5)
WBC: 3.9 10*3/uL — ABNORMAL LOW (ref 4.0–10.5)
WBC: 4.4 10*3/uL (ref 4.0–10.5)
nRBC: 0 % (ref 0.0–0.2)
nRBC: 0 % (ref 0.0–0.2)

## 2024-01-28 LAB — CBG MONITORING, ED
Glucose-Capillary: 72 mg/dL (ref 70–99)
Glucose-Capillary: >600 mg/dL (ref 70–99)
Glucose-Capillary: 414 mg/dL — ABNORMAL HIGH (ref 70–99)
Glucose-Capillary: 300 mg/dL — ABNORMAL HIGH (ref 70–99)
Glucose-Capillary: 326 mg/dL — ABNORMAL HIGH (ref 70–99)

## 2024-01-28 LAB — LIPID PANEL
Cholesterol: 201 mg/dL — ABNORMAL HIGH (ref 0–200)
HDL: 75 mg/dL (ref >40)
LDL Cholesterol: 109 mg/dL — ABNORMAL HIGH (ref 0–99)
Total CHOL/HDL Ratio: 2.7 ratio
Triglycerides: 85 mg/dL (ref <150)
VLDL: 17 mg/dL (ref 0–40)

## 2024-01-28 LAB — POCT URINE DRUG SCREEN - MANUAL ENTRY (I-SCREEN)
POC Amphetamine UR: NOT DETECTED
POC Buprenorphine (BUP): NOT DETECTED
POC Cocaine UR: NOT DETECTED
POC Marijuana UR: POSITIVE — AB
POC Methadone UR: NOT DETECTED
POC Methamphetamine UR: NOT DETECTED
POC Morphine: NOT DETECTED
POC Oxazepam (BZO): NOT DETECTED
POC Oxycodone UR: NOT DETECTED
POC Secobarbital (BAR): NOT DETECTED

## 2024-01-28 LAB — I-STAT VENOUS BLOOD GAS, ED
Acid-Base Excess: 2 mmol/L (ref 0.0–2.0)
Bicarbonate: 24.8 mmol/L (ref 20.0–28.0)
Calcium, Ion: 1.08 mmol/L — ABNORMAL LOW (ref 1.15–1.40)
HCT: 42 % (ref 39.0–52.0)
Hemoglobin: 14.3 g/dL (ref 13.0–17.0)
O2 Saturation: 99 %
Potassium: 4.3 mmol/L (ref 3.5–5.1)
Sodium: 126 mmol/L — ABNORMAL LOW (ref 135–145)
TCO2: 26 mmol/L (ref 22–32)
pCO2, Ven: 33.7 mmHg — ABNORMAL LOW (ref 44–60)
pH, Ven: 7.475 — ABNORMAL HIGH (ref 7.25–7.43)
pO2, Ven: 113 mmHg — ABNORMAL HIGH (ref 32–45)

## 2024-01-28 LAB — RAPID URINE DRUG SCREEN, HOSP PERFORMED
Amphetamines: NOT DETECTED
Barbiturates: NOT DETECTED
Benzodiazepines: NOT DETECTED
Cocaine: NOT DETECTED
Opiates: NOT DETECTED
Tetrahydrocannabinol: NOT DETECTED

## 2024-01-28 LAB — ETHANOL
Alcohol, Ethyl (B): <10 mg/dL (ref <10)
Alcohol, Ethyl (B): <10 mg/dL (ref <10)

## 2024-01-28 LAB — HIV ANTIBODY (ROUTINE TESTING W REFLEX): HIV Screen 4th Generation wRfx: NONREACTIVE

## 2024-01-28 LAB — GLUCOSE, CAPILLARY: Glucose-Capillary: >600 mg/dL (ref 70–99)

## 2024-01-28 LAB — I-STAT CG4 LACTIC ACID, ED
Lactic Acid, Venous: 0.8 mmol/L (ref 0.5–1.9)
Lactic Acid, Venous: 0.8 mmol/L (ref 0.5–1.9)

## 2024-01-28 LAB — HEMOGLOBIN A1C
Hgb A1c MFr Bld: 14.8 % — ABNORMAL HIGH (ref 4.8–5.6)
Mean Plasma Glucose: 378.06 mg/dL

## 2024-01-28 LAB — TSH: TSH: 0.926 u[IU]/mL (ref 0.350–4.500)

## 2024-01-28 MED ORDER — LORAZEPAM 2 MG/ML IJ SOLN: 2.0000 mg | Freq: Three times a day (TID) | INTRAMUSCULAR | Status: DC | PRN

## 2024-01-28 MED ORDER — DIPHENHYDRAMINE HCL 50 MG/ML IJ SOLN: 50.0000 mg | Freq: Three times a day (TID) | INTRAMUSCULAR | Status: DC | PRN

## 2024-01-28 MED ORDER — INSULIN GLARGINE-YFGN 100 UNIT/ML ~~LOC~~ SOLN
30.0000 [IU] | Freq: Every day | SUBCUTANEOUS | Status: DC
  Administered 2024-01-29: 30 [IU] via SUBCUTANEOUS
  Filled 2024-01-28: qty 0.3

## 2024-01-28 MED ORDER — HALOPERIDOL LACTATE 5 MG/ML IJ SOLN: 5.0000 mg | Freq: Three times a day (TID) | INTRAMUSCULAR | Status: DC | PRN

## 2024-01-28 MED ORDER — ALUM & MAG HYDROXIDE-SIMETH 200-200-20 MG/5ML PO SUSP: 30.0000 mL | ORAL | Status: DC | PRN

## 2024-01-28 MED ORDER — INSULIN ASPART 100 UNIT/ML IJ SOLN
8.0000 [IU] | Freq: Once | INTRAMUSCULAR | Status: AC
  Administered 2024-01-28: 8 [IU] via SUBCUTANEOUS

## 2024-01-28 MED ORDER — INSULIN GLARGINE-YFGN 100 UNIT/ML ~~LOC~~ SOLN
24.0000 [IU] | Freq: Once | SUBCUTANEOUS | Status: AC
  Administered 2024-01-28: 24 [IU] via SUBCUTANEOUS
  Filled 2024-01-28: qty 0.24

## 2024-01-28 MED ORDER — SODIUM CHLORIDE 0.9 % IV BOLUS
1000.0000 mL | Freq: Once | INTRAVENOUS | Status: AC
  Administered 2024-01-28: 1000 mL via INTRAVENOUS

## 2024-01-28 MED ORDER — HYDROXYZINE HCL 25 MG PO TABS: 25.0000 mg | ORAL_TABLET | Freq: Three times a day (TID) | ORAL | Status: DC | PRN

## 2024-01-28 MED ORDER — GLUCOSE BLOOD VI STRP: 1.0000 | ORAL_STRIP | Freq: Three times a day (TID) | Status: DC

## 2024-01-28 MED ORDER — ARIPIPRAZOLE 15 MG PO TABS
15.0000 mg | ORAL_TABLET | Freq: Every day | ORAL | Status: DC
  Administered 2024-01-28: 15 mg via ORAL
  Filled 2024-01-28: qty 1

## 2024-01-28 MED ORDER — INSULIN ASPART 100 UNIT/ML IJ SOLN
10.0000 [IU] | Freq: Once | INTRAMUSCULAR | Status: DC
Start: 2024-01-28 — End: 2024-01-28

## 2024-01-28 MED ORDER — MAGNESIUM HYDROXIDE 400 MG/5ML PO SUSP: 30.0000 mL | Freq: Every day | ORAL | Status: DC | PRN

## 2024-01-28 MED ORDER — ACETAMINOPHEN 325 MG PO TABS: 650.0000 mg | ORAL_TABLET | Freq: Four times a day (QID) | ORAL | Status: DC | PRN

## 2024-01-28 MED ORDER — INSULIN ASPART 100 UNIT/ML IJ SOLN
10.0000 [IU] | Freq: Three times a day (TID) | INTRAMUSCULAR | Status: DC
  Administered 2024-01-29 (×2): 10 [IU] via SUBCUTANEOUS

## 2024-01-28 MED ORDER — MELATONIN 3 MG PO TABS: 3.0000 mg | ORAL_TABLET | Freq: Every day | ORAL | Status: DC

## 2024-01-28 MED ORDER — ARIPIPRAZOLE 5 MG PO TABS
15.0000 mg | ORAL_TABLET | Freq: Every day | ORAL | Status: DC
  Administered 2024-01-29 – 2024-01-30 (×2): 15 mg via ORAL
  Filled 2024-01-28: qty 1
  Filled 2024-01-28: qty 2

## 2024-01-28 MED ORDER — VITAMIN D (ERGOCALCIFEROL) 1.25 MG (50000 UNIT) PO CAPS
50000.0000 [IU] | ORAL_CAPSULE | ORAL | Status: DC
  Administered 2024-01-30: 50000 [IU] via ORAL
  Filled 2024-01-28: qty 1

## 2024-01-28 NOTE — Progress Notes (Signed)
   01/28/24 1027  [facility] Triage Screening (Walk-ins at [facility] only)  How Did You Hear About [place]? Legal System  What Is the Reason for Your Visit/Call Today? Jacob [name] is a 33 year old male presenting to [facility] escorted by [employer] looking for housing. Pt reports he has a court date on 4/20 and is in need of a place to stay until then. Pt denies substance use, Si, Hi and Avh.  How Long Has This Been Causing You Problems? <Week  Have You Recently Had Any Thoughts About Hurting Yourself? No  Are You Planning to Commit Suicide/Harm Yourself At This time? No  Have you Recently Had Thoughts About Hurting Someone Karolee Ohs? No  Are You Planning To Harm Someone At This Time? No  Physical Abuse Denies  Verbal Abuse Denies  Sexual Abuse Denies  Exploitation of patient/patient's resources Denies  Self-Neglect Denies  Possible abuse reported to: Other (Comment)  Are you currently experiencing any auditory, visual or other hallucinations? No  Have You Used Any Alcohol or Drugs in the Past 24 Hours? No  Do you have any current medical co-morbidities that require immediate attention? No  Clinician description of patient physical appearance/behavior: coherent, slightly agitated  What Do You Feel Would Help You the Most Today? Housing Assistance  If access to [facility] was not available, would you have sought care in the Emergency Department? No  Determination of Need Routine (7 days)  Options For Referral Other: Comment

## 2024-01-28 NOTE — ED Triage Notes (Signed)
 The pt is homeless and he reports that he wants to be checked out he is a diabetic and his first request was for food   no pain anywhere he last ate yesterday

## 2024-01-28 NOTE — Discharge Instructions (Addendum)
 Blood sugar was normal today. Follow-up with your doctor. Return here for new concerns.

## 2024-01-28 NOTE — ED Notes (Signed)
 Pt presented to [facility] voluntarily w/ c/o housing. Hx includes, DM 1, bipolar 1 and schizoaffective disorder. Pt is homeless and came in initially requesting to stay until 4/20, which is when he says he has a court date. Upon assessment, pt is delusional, grandiose, paranoid, tangential, loose associations, disorganized, having AVH. Pt is cooperative and pleasant.  Denies SI/HI. Contracts for safety. Pt in NAD at this time. Encouragement and support given. Will continue to monitor.

## 2024-01-28 NOTE — ED Notes (Signed)
 Attempted to notify [facility] Charge Nurse of patient transfer x 3 times without success.

## 2024-01-28 NOTE — ED Notes (Signed)
 Pt given meal bag upon discharge.

## 2024-01-28 NOTE — BH Assessment (Signed)
 Comprehensive Clinical Assessment (CCA) Note  01/28/2024 Jacob [name] [id number]  DISPOSITION: Patient meets inpatient criteria as bed placement is investigated.   The patient demonstrates the following risk factors for suicide: Chronic risk factors for suicide include: N/A. Acute risk factors for suicide include: N/A. Protective factors for this patient include: coping skills. Considering these factors, the overall suicide risk at this point appears to be low. Patient is not appropriate for outpatient follow up.   Patient is a 33 year old male that presents this date as a voluntary walk in brought in by [facility] after he contacted emergency services while in the community requesting to have a mental health evaluation stating he needs housing and to, "get checked out." Per chart review patient has a PMHx significant for Schizoaffective Disorder, Diabetes and homelessness. Patient was last seen on 09/01/2023 when he presented to [facility] with similar symptoms. Patient denies any SI, HI or AVH although is observed to be delusional displaying active flihght of ideas.   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 Patient states he also has an upcoming courtdate on 02/19/2024 for a DUI charge. Patient denies access to firearms and states he is currently not on probabtion.   Patient reports he is currently not receiving any OP treatment for mental health symptoms, precribed medication/s or involved in therapy. Per chart review patient has been on multiple medications in the past (See MAR) including a LAI and mood stabilizer. Patient when asked how long he has been off medications states, "years," denying he has any current mental health symptoms.  Patient per notes had been residing with his grandmother until recently when patient became aggressive with her and he was asked to leave. Patient renders limited history in reference to psych-social stressors or additional information associated with family or SA use. Per chart review patient has a history of using multiple substances although denies this date. UDS pending.        During evaluation Jacob [name] is sitting in the triage room alert, oriented x 4, calm, cooperative although as aforementioned delusional displaying active flight of ideas. Patient is difficult to redirect and renders limited history. Patient's memory appears to be intact although thoughts disorganized. Patient's mood is anxious/suspicious with affect congruent. Objectively there is evidence of patient responding to internal stimuli. He is distractible and difficult to redirect. He denies suicidal/self-harm/homicidal ideation, psychosis, and paranoia.      Chief Complaint:  Chief Complaint  Patient presents with   Housing   Visit Diagnosis: Schizoaffective Disorder     CCA Screening, Triage and Referral (STR)  Patient Reported Information How did you hear about [place]? Self  What Is the Reason for Your Visit/Call Today? Ring is a 33 year old male presenting to [facility] escorted by [employer] looking for housing. Pt reports he has a court date on 4/20 and is in need of a place to stay until then. Pt denies substance use, Si, Hi and  How Long Has This Been Causing You Problems? 1-6 months  What Do You Feel Would Help You the Most Today? Treatment for Depression or other mood problem   Have You Recently Had Any Thoughts About Hurting Yourself? No  Are You Planning to Commit Suicide/Harm Yourself At This time? No  Flowsheet Row ED from 01/28/2024 in [facility] Most recent reading at 01/28/2024 10:28 AM ED from 01/28/2024 in [facility] Emergency Department at [facility] Most recent reading  at 01/28/2024  3:07 AM ED from 01/26/2024 in [facility] Emergency Department at [facility] Most recent reading at 01/26/2024 11:50 PM  C-SSRS RISK CATEGORY No Risk No Risk No Risk       Have you Recently Had Thoughts About Hurting Someone Karolee Ohs? No  Are You Planning to Harm Someone at This Time? No  Explanation: NA   Have You Used Any Alcohol or Drugs in the Past 24 Hours? No  How Long Ago Did You Use Drugs or Alcohol? Pt denies What Did You Use and How Much? NA  Do You Currently Have a Therapist/Psychiatrist? No  Name of Therapist/Psychiatrist: NA   Have You Been Recently Discharged From Any Office Practice or Programs? No  Explanation of Discharge From Practice/Program: NA    CCA Screening Triage Referral Assessment Type of Contact: Face-to-Face  Telemedicine Service Delivery:  face to face Is this Initial or Reassessment? initial  Date Telepsych consult ordered in [facility]: 01/28/2024   Time Telepsych consult ordered in [facility]: 1300   Location of Assessment: [facility] Assessment Services  Provider Location: [facility] Assessment Services   Collateral Involvement: None at this time   Does Patient Have a Automotive engineer Guardian? No  Legal Guardian Contact Information: NA  Copy of Legal Guardianship Form: -- (NA)  Legal Guardian Notified of Arrival: -- (NA)  Legal Guardian Notified of Pending Discharge: -- (NA)  If Minor and Not Living with Parent(s), Who has Custody? NA  Is CPS involved or ever been involved? Never  Is APS involved or ever been involved? Never   Patient Determined To Be At Risk for Harm To Self or Others Based on Review of Patient Reported Information or Presenting Complaint? No  Method: No Plan  Availability of Means: No access or NA  Intent: Vague intent or NA  Notification Required: No need or identified person  Additional Information for Danger to Others Potential: -- (NA)  Additional Comments for Danger to Others Potential:  NA  Are There Guns or Other Weapons in Your Home? No  Types of Guns/Weapons: NA  Are These Weapons Safely Secured?                            -- (NA)  Who Could Verify You Are Able To Have These Secured: NA  Do You Have any Outstanding Charges, Pending Court Dates, Parole/Probation? Pt has upcoming DUI charges on 4/20  Contacted To Inform of Risk of Harm To Self or Others: Other: Comment (NA)    Does Patient Present under Involuntary Commitment? No    [place] of Residence: [place]   Patient Currently Receiving the Following Services: Not Receiving Services   Determination of Need: Urgent (48 hours)   Options For Referral: Inpatient Hospitalization     CCA Biopsychosocial Patient Reported Schizophrenia/Schizoaffective Diagnosis in Past: Yes (per chart review)   Strengths: Pt is tratment motivated and willing to engage in services   Mental Health Symptoms Depression:  Change in energy/activity; Difficulty Concentrating   Duration of Depressive symptoms: Duration of Depressive Symptoms: Greater than two weeks   Mania:  Racing thoughts; Change in energy/activity   Anxiety:   Difficulty concentrating; Restlessness   Psychosis:  Delusions   Duration of Psychotic symptoms: Duration  of Psychotic Symptoms: Greater than six months   Trauma:  None   Obsessions:  None   Compulsions:  None   Inattention:  None   Hyperactivity/Impulsivity:  Blurts out answers   Oppositional/Defiant Behaviors:  Easily annoyed   Emotional Irregularity:  Chronic feelings of emptiness   Other Mood/Personality Symptoms:  None noted    Mental Status Exam Appearance and self-care  Stature:  Average   Weight:  Thin   Clothing:  Disheveled   Grooming:  Neglected   Cosmetic use:  None   Posture/gait:  Bizarre   Motor activity:  Restless; Agitated   Sensorium  Attention:  Distractible   Concentration:  Preoccupied   Orientation:  Object; Person; Place; Situation    Recall/memory:  Normal   Affect and Mood  Affect:  Anxious   Mood:  Anxious   Relating  Eye contact:  Fleeting   Facial expression:  Anxious   Attitude toward examiner:  Cooperative   Thought and Language  Speech flow: Flight of Ideas   Thought content:  Appropriate to Mood and Circumstances   Preoccupation:  None   Hallucinations:  None   Organization:  Disorganized   Company secretary of Knowledge:  Fair   Intelligence:  Average   Abstraction:  Normal   Judgement:  Normal   Reality Testing:  Realistic   Insight:  Fair   Decision Making:  Only simple   Social Functioning  Social Maturity:  Responsible   Social Judgement:  "Chief of Staff"   Stress  Stressors:  Family conflict   Coping Ability:  Deficient supports   Skill Deficits:  Activities of daily living   Supports:  Support needed     Religion: Religion/Spirituality Are You A Religious Person?: No How Might This Affect Treatment?: NA  Leisure/Recreation: Leisure / Recreation Do You Have Hobbies?: No  Exercise/Diet: Exercise/Diet Do You Exercise?: No Have You Gained or Lost A Significant Amount of Weight in the Past Six Months?: No Do You Follow a Special Diet?: No Do You Have Any Trouble Sleeping?: No   CCA Employment/Education Employment/Work Situation: Employment / Work Situation Employment Situation: Unemployed Patient's Job has Been Impacted by Current Illness: No Has Patient ever Been in Equities trader?: No  Education: Education Is Patient Currently Attending School?: No Last Grade Completed: 12 Did You Product manager?: No Did You Have An Individualized Education Program (IIEP): No Did You Have Any Difficulty At [employer]?: No Patient's Education Has Been Impacted by Current Illness: No   CCA Family/Childhood History Family and Relationship History: Family history Marital status: Single Does patient have children?: Yes How many children?: 1 How is patient's  relationship with their children?: Pt has one 8 year currently living with grandparents  Childhood History:  Childhood History By whom was/is the patient raised?: Grandparents Did patient suffer any verbal/emotional/physical/sexual abuse as a child?: No (UTA) Did patient suffer from severe childhood neglect?: No Has patient ever been sexually abused/assaulted/raped as an adolescent or adult?: No Was the patient ever a victim of a crime or a disaster?: No Witnessed domestic violence?: No Has patient been affected by domestic violence as an adult?: No       CCA Substance Use Alcohol/Drug Use: Alcohol / Drug Use Pain Medications: See MAR Prescriptions: See MAR Over the Counter: See MAR History of alcohol / drug use?: Yes (Pt denies current use although per notes has used multiple substances in the past) Longest period of sobriety (when/how long): Unknown Negative Consequences of Use:  (  NA) Withdrawal Symptoms: None                         ASAM's:  Six Dimensions of Multidimensional Assessment  Dimension 1:  Acute Intoxication and/or Withdrawal Potential:   Dimension 1:  Description of individual's past and current experiences of substance use and withdrawal: NA  Dimension 2:  Biomedical Conditions and Complications:   Dimension 2:  Description of patient's biomedical conditions and  complications: NA  Dimension 3:  Emotional, Behavioral, or Cognitive Conditions and Complications:  Dimension 3:  Description of emotional, behavioral, or cognitive conditions and complications: NA  Dimension 4:  Readiness to Change:  Dimension 4:  Description of Readiness to Change criteria: NA  Dimension 5:  Relapse, Continued use, or Continued Problem Potential:  Dimension 5:  Relapse, continued use, or continued problem potential critiera description: NA  Dimension 6:  Recovery/Living Environment:  Dimension 6:  Recovery/Iiving environment criteria description: NA  ASAM Severity Score:     ASAM Recommended Level of Treatment: ASAM Recommended Level of Treatment:  (NA)   Substance use Disorder (SUD) Substance Use Disorder (SUD)  Checklist Symptoms of Substance Use:  (NA)  Recommendations for Services/Supports/Treatments: Recommendations for Services/Supports/Treatments Recommendations For Services/Supports/Treatments:  (NA)  Disposition Recommendation per psychiatric provider: We recommend inpatient psychiatric hospitalization when medically cleared. Patient is under voluntary admission status at this time; please IVC if attempts to leave hospital.   DSM5 Diagnoses: Patient Active Problem List   Diagnosis Date Noted   Vitamin D deficiency 01/10/2024   Malingering 12/27/2023   Schizoaffective disorder (HCC) 12/26/2023   Homeless 12/17/2023   AKI (acute kidney injury) (HCC) 12/17/2023   Homelessness 12/17/2023   Influenza A 11/30/2023   DKA (diabetic ketoacidosis) (HCC) 11/30/2023   DKA, type 1 (HCC) 10/23/2023   Uncontrolled type 1 diabetes mellitus with hyperglycemia, with long-term current use of insulin (HCC) 10/10/2023   Blister (nonthermal), right foot, initial encounter 08/01/2023   Generalized anxiety disorder 08/31/2022   Tobacco dependence 08/31/2022   Schizoaffective disorder, bipolar type (HCC) 12/03/2021   Uncontrolled type 1 diabetes mellitus with hypoglycemia without coma (HCC) 06/06/2020   Polycythemia 03/04/2020   MDD (major depressive disorder), recurrent, severe, with psychosis (HCC)      Referrals to Alternative Service(s): Referred to Alternative Service(s):   Place:   Date:   Time:    Referred to Alternative Service(s):   Place:   Date:   Time:    Referred to Alternative Service(s):   Place:   Date:   Time:    Referred to Alternative Service(s):   Place:   Date:   Time:     Alfredia Ferguson, LCAS

## 2024-01-28 NOTE — ED Triage Notes (Signed)
 BIB by [facility] from [facility], for hyperglycemia. Patient went to [facility] looking for help for his mania, but was sent here due to patient not being on his insulin since yesterday, patient has a history of schizophrenia.     128/80 80 98 Cbg 750

## 2024-01-28 NOTE — ED Provider Notes (Signed)
 [place] EMERGENCY DEPARTMENT AT [facility] Provider Note   CSN: [id number] Arrival date & time: 01/28/24  0236     History  Chief Complaint  Patient presents with   wants to be checked out    Jacob [name] is a 33 y.o. male.  The history is provided by the patient and medical records.   33 year old male with history of diabetes, depression, homelessness, schizoaffective disorder, presenting to the ED requesting to be "checked out".  He is currently homeless.  States he is hungry.  Has not eaten since yesterday.  Home Medications Prior to Admission medications   Medication Sig Start Date End Date Taking? Authorizing Provider  ARIPiprazole (ABILIFY) 15 MG tablet Take 1 tablet (15 mg total) by mouth daily. 01/09/24   Lovie Macadamia, MD  Continuous Glucose Receiver (DEXCOM G7 RECEIVER) DEVI Use as directed to continuously monitor blood glucose. 01/09/24   Lovie Macadamia, MD  Continuous Glucose Sensor (DEXCOM G7 SENSOR) MISC Place new sensor every 10 days. Use to continuously monitor blood sugar. 11/16/23   Katheran James, DO  glucose blood (ACCU-CHEK GUIDE TEST) test strip Use up to 3 strips a day when not wearing Continuous glucose monitor 11/16/23   Katheran James, DO  insulin glargine (LANTUS) 100 UNIT/ML injection Inject 0.24 mLs (24 Units total) into the skin daily for 90 doses. 01/09/24 04/08/24  Lovie Macadamia, MD  insulin lispro (HUMALOG) 100 UNIT/ML KwikPen Inject 10 Units into the skin 3 (three) times daily before meals. 01/09/24 04/08/24  Katheran James, DO  Insulin Pen Needle (BD PEN NEEDLE NANO 2ND GEN) 32G X 4 MM MISC Use to inject insulin up to 4 times a day 11/16/23   Katheran James, DO  Lancet Device MISC 1 each by Does not apply route 3 (three) times daily. May dispense any manufacturer covered by patient's insurance. 10/24/23   Morrie Sheldon, MD  Vitamin D, Ergocalciferol, (DRISDOL) 1.25 MG (50000 UNIT) CAPS capsule Take 1 capsule  (50,000 Units total) by mouth every 7 (seven) days. 01/09/24   Lovie Macadamia, MD      Allergies    Patient has no known allergies.    Review of Systems   Review of Systems  Constitutional:        Hungry/homeless  All other systems reviewed and are negative.   Physical Exam Updated Vital Signs BP 108/81 (BP Location: Left Arm)   Pulse 66   Temp 98 F (36.7 C) (Oral)   Resp 15   Ht 5\' 9"  (1.753 m)   Wt 59.9 kg   SpO2 99%   BMI 19.50 kg/m   Physical Exam Vitals and nursing note reviewed.  Constitutional:      Appearance: He is well-developed.     Comments: Sleeping comfortably  HENT:     Head: Normocephalic and atraumatic.  Eyes:     Conjunctiva/sclera: Conjunctivae normal.     Pupils: Pupils are equal, round, and reactive to light.  Cardiovascular:     Rate and Rhythm: Normal rate and regular rhythm.     Heart sounds: Normal heart sounds.  Pulmonary:     Effort: Pulmonary effort is normal.     Breath sounds: Normal breath sounds.  Abdominal:     General: Bowel sounds are normal.     Palpations: Abdomen is soft.  Musculoskeletal:        General: Normal range of motion.     Cervical back: Normal range of motion.  Skin:    General: Skin is warm  and dry.  Neurological:     Mental Status: He is oriented to person, place, and time.     ED Results / Procedures / Treatments   Labs (all labs ordered are listed, but only abnormal results are displayed) Labs Reviewed  CBG MONITORING, ED    EKG None  Radiology No results found.  Procedures Procedures    Medications Ordered in ED Medications - No data to display  ED Course/ Medical Decision Making/ A&P                                 Medical Decision Making  33 year old male presenting to the ED for "checkup".  States he is diabetic and was concerned about his sugar.  He is also hungry and has not eaten since yesterday.  CBG in triage is normal at 72.  Patient was allowed to sleep here for a few  hours.  Appears stable for discharge.  Will have him follow-up closely with his primary care doctor.  Can return here for new concerns.  Final Clinical Impression(s) / ED Diagnoses Final diagnoses:  Encounter for medical screening examination    Rx / DC Orders ED Discharge Orders     None         Garlon Hatchet, [name] 01/28/24 1610    Tilden Fossa, MD 01/28/24 [phone]

## 2024-01-28 NOTE — ED Notes (Signed)
 This nurse spoke with [facility] regarding patient's transfer over. Tresa Endo stated that the patient's CBG needs to be under 350 for 24 hours before they can accept him back.

## 2024-01-28 NOTE — ED Notes (Signed)
Report called to Asher Muir RN at [facility].

## 2024-01-28 NOTE — ED Provider Notes (Signed)
 [place] EMERGENCY DEPARTMENT AT [facility] Provider Note   CSN: [id number] Arrival date & time: 01/28/24  1530     History {Add pertinent medical, surgical, social history, OB history to HPI:1} Chief Complaint  Patient presents with   Hyperglycemia    Jacob [name] is a 33 y.o. male.  33 year old male presents from [facility] evaluation of hyperglycemia.  Patient admits that he did not take his insulin this morning.  He denies other complaint.  Patient was noted to be hyperglycemic and sent to the ED for evaluation.  He is here voluntarily.  The history is provided by the patient.       Home Medications Prior to Admission medications   Medication Sig Start Date End Date Taking? Authorizing Provider  ARIPiprazole (ABILIFY) 15 MG tablet Take 1 tablet (15 mg total) by mouth daily. 01/09/24  Yes Lovie Macadamia, MD  insulin glargine (LANTUS) 100 UNIT/ML injection Inject 0.24 mLs (24 Units total) into the skin daily for 90 doses. 01/09/24 04/08/24 Yes Lovie Macadamia, MD  insulin lispro (HUMALOG) 100 UNIT/ML KwikPen Inject 10 Units into the skin 3 (three) times daily before meals. 01/09/24 04/08/24 Yes Juberg, Cristal Deer, DO  Vitamin D, Ergocalciferol, (DRISDOL) 1.25 MG (50000 UNIT) CAPS capsule Take 1 capsule (50,000 Units total) by mouth every 7 (seven) days. 01/09/24  Yes Lovie Macadamia, MD  glucose blood (ACCU-CHEK GUIDE TEST) test strip Use up to 3 strips a day when not wearing Continuous glucose monitor 11/16/23   Katheran James, DO  Insulin Pen Needle (BD PEN NEEDLE NANO 2ND GEN) 32G X 4 MM MISC Use to inject insulin up to 4 times a day 11/16/23   Katheran James, DO  Lancet Device MISC 1 each by Does not apply route 3 (three) times daily. May dispense any manufacturer covered by patient's insurance. 10/24/23   Morrie Sheldon, MD      Allergies    Patient has no known allergies.    Review of Systems   Review of Systems  All other systems reviewed and are  negative.   Physical Exam Updated Vital Signs BP 122/84   Pulse 72   Temp 98.6 F (37 C) (Oral)   Resp 13   SpO2 100%  Physical Exam Vitals and nursing note reviewed.  Constitutional:      General: He is not in acute distress.    Appearance: Normal appearance. He is well-developed.  HENT:     Head: Normocephalic and atraumatic.  Eyes:     Conjunctiva/sclera: Conjunctivae normal.     Pupils: Pupils are equal, round, and reactive to light.  Cardiovascular:     Rate and Rhythm: Normal rate and regular rhythm.     Heart sounds: Normal heart sounds.  Pulmonary:     Effort: Pulmonary effort is normal. No respiratory distress.     Breath sounds: Normal breath sounds.  Abdominal:     General: There is no distension.     Palpations: Abdomen is soft.     Tenderness: There is no abdominal tenderness.  Musculoskeletal:        General: No deformity. Normal range of motion.     Cervical back: Normal range of motion and neck supple.  Skin:    General: Skin is warm and dry.  Neurological:     General: No focal deficit present.     Mental Status: He is alert and oriented to person, place, and time.     ED Results / Procedures / Treatments   Labs (  all labs ordered are listed, but only abnormal results are displayed) Labs Reviewed  COMPREHENSIVE METABOLIC PANEL WITH GFR - Abnormal; Notable for the following components:      Result Value   Sodium 126 (*)    Chloride 93 (*)    Glucose, Bld 550 (*)    Total Protein 6.1 (*)    All other components within normal limits  URINALYSIS, W/ REFLEX TO CULTURE (INFECTION SUSPECTED) - Abnormal; Notable for the following components:   Color, Urine COLORLESS (*)    Glucose, UA >=500 (*)    Ketones, ur 20 (*)    All other components within normal limits  CBG MONITORING, ED - Abnormal; Notable for the following components:   Glucose-Capillary >600 (*)    All other components within normal limits  I-STAT CHEM 8, ED - Abnormal; Notable for the  following components:   Sodium 127 (*)    Chloride 94 (*)    Glucose, Bld 568 (*)    Calcium, Ion 1.09 (*)    All other components within normal limits  I-STAT VENOUS BLOOD GAS, ED - Abnormal; Notable for the following components:   pH, Ven 7.475 (*)    pCO2, Ven 33.7 (*)    pO2, Ven 113 (*)    Sodium 126 (*)    Calcium, Ion 1.08 (*)    All other components within normal limits  ETHANOL  CBC WITH DIFFERENTIAL/PLATELET  RAPID URINE DRUG SCREEN, HOSP PERFORMED  I-STAT CG4 LACTIC ACID, ED    EKG EKG Interpretation Date/Time:  Saturday January 28 2024 16:09:05 EDT Ventricular Rate:  77 PR Interval:  150 QRS Duration:  80 QT Interval:  378 QTC Calculation: 428 R Axis:   99  Text Interpretation: Sinus rhythm Borderline right axis deviation ST elev, probable normal early repol pattern Confirmed by [name] [phone]) on 01/28/2024 4:18:46 PM  Radiology DG Chest Port 1 View Result Date: 01/28/2024 CLINICAL DATA:  weakness EXAM: PORTABLE CHEST 1 VIEW COMPARISON:  October 26, 2021 FINDINGS: The cardiomediastinal silhouette is unchanged in contour given differences in technique. No pleural effusion. No pneumothorax. No acute pleuroparenchymal abnormality. IMPRESSION: No acute cardiopulmonary abnormality. Electronically Signed   By: Meda Klinefelter M.D.   On: 01/28/2024 16:42    Procedures Procedures  {Document cardiac monitor, telemetry assessment procedure when appropriate:1}  Medications Ordered in ED Medications  insulin glargine-yfgn (SEMGLEE) injection 24 Units (has no administration in time range)  insulin aspart (novoLOG) injection 8 Units (has no administration in time range)  sodium chloride 0.9 % bolus 1,000 mL (1,000 mLs Intravenous New Bag/Given 01/28/24 1709)    ED Course/ Medical Decision Making/ A&P   {   Click here for ABCD2, HEART and other calculatorsREFRESH Note before signing :1}                              Medical Decision Making Amount and/or  Complexity of Data Reviewed Labs: ordered. Radiology: ordered.  Risk Prescription drug management.    Medical Screen Complete  This patient presented to the ED with complaint of ***.  This complaint involves an extensive number of treatment options. The initial differential diagnosis includes, but is not limited to, ***  This presentation is: {IllnessRisk:19196::"***","Acute","Chronic","Self-Limited","Previously Undiagnosed","Uncertain Prognosis","Complicated","Systemic Symptoms","Threat to Life/Bodily Function"}    Co morbidities that complicated the patient's evaluation  ***   Additional history obtained:  Additional history obtained from {History source:19196::"EMS","Spouse","Family","Friend","Caregiver"} External records from outside sources obtained and reviewed  including prior ED visits and prior Inpatient records.    Lab Tests:  I ordered and personally interpreted labs.  The pertinent results include:  ***   Imaging Studies ordered:  I ordered imaging studies including ***  I independently visualized and interpreted obtained imaging which showed *** I agree with the radiologist interpretation.   Cardiac Monitoring:  The patient was maintained on a cardiac monitor.  I personally viewed and interpreted the cardiac monitor which showed an underlying rhythm of: ***   Medicines ordered:  I ordered medication including ***  for ***  Reevaluation of the patient after these medicines showed that the patient: {resolved/improved/worsened:23923::"improved"}    Test Considered:  ***   Critical Interventions:  ***   Consultations Obtained:  I consulted ***,  and discussed lab and imaging findings as well as pertinent plan of care.    Problem List / ED Course:  ***   Reevaluation:  After the interventions noted above, I reevaluated the patient and found that they have: {resolved/improved/worsened:23923::"improved"}   Social Determinants of  Health:  ***   Disposition:  After consideration of the diagnostic results and the patients response to treatment, I feel that the patent would benefit from ***.    {Document critical care time when appropriate:1} {Document review of labs and clinical decision tools ie heart score, Chads2Vasc2 etc:1}  {Document your independent review of radiology images, and any outside records:1} {Document your discussion with family members, caretakers, and with consultants:1} {Document social determinants of health affecting pt's care:1} {Document your decision making why or why not admission, treatments were needed:1} Final Clinical Impression(s) / ED Diagnoses Final diagnoses:  None    Rx / DC Orders ED Discharge Orders     None

## 2024-01-28 NOTE — Discharge Instructions (Signed)
 Return for any problem.    Take your Insulin everyday.

## 2024-01-28 NOTE — ED Provider Notes (Cosign Needed Addendum)
 [facility] Continuous Assessment Admission H&P  Date: 01/28/24 Patient Name: Jacob [name] MRN: [id number] Chief Complaint: "I need somewhere to stay"  Diagnoses:  Final diagnoses:  None    HPI: 33 year old African male with a past psychiatric history of schizoaffective disorder, depression, and a medical history of diabetes, presenting to the emergency department voluntarily requesting to be "for housing." The patient is currently homeless and reports increased paranoia and auditory hallucinations. He states, "[employer] and [employer] are monitoring my brain activity" and "I hear the sound waves." He also reports upcoming court dates in [place] for attempted murder charges and in [place], though details remain unclear.The patient appears restless and internally preoccupied. He expresses concerns about being overmedicated and states, "I only want to be on five pills -- I don't need a lot right now." He endorses vague suicidal ideation but denies a specific plan or intent. There is no report of homicidal ideation. He agrees to voluntary psychiatric admission for further evaluation and stabilization. The patient's thought content is delusional, his insight is limited, and his judgment is impaired.His homelessness, pending legal issues, unmanaged diabetes, and lack of consistent psychiatric care appear to be contributing to his current decompensation.    Total Time spent with patient: 45 minutes  Musculoskeletal  Strength & Muscle Tone: within normal limits Gait & Station: normal Patient leans: N/A  Psychiatric Specialty Exam  Presentation General Appearance:  Disheveled (unkempt, appears stated age)  Eye Contact: Good  Speech: Pressured (occasionally difficult to redirect)  Speech Volume: Decreased  Handedness: Right   Mood and Affect  Mood: Anxious; Irritable  Affect: Labile; Non-Congruent (inappropriate at times)   Thought Process  Thought Processes: Disorganized  (fixed false beliefs about legal matters)  Descriptions of Associations:Tangential  Orientation:Partial  Thought Content:Paranoid Ideation ((e.g., government agencies monitoring brain activity))  Diagnosis of Schizophrenia or Schizoaffective disorder in past: Yes  Duration of Psychotic Symptoms: Greater than six months  Hallucinations:Hallucinations: Auditory Description of Auditory Hallucinations: (hearing "sound waves");  Ideas of Reference:Paranoia  Suicidal Thoughts:Suicidal Thoughts: No (Endorsed, though vague and non-specific)  Homicidal Thoughts:Homicidal Thoughts: No   Sensorium  Memory: Immediate Poor; Recent Poor; Remote Poor  Judgment: Impaired  Insight: Poor   Executive Functions  Concentration: Poor (Due to endorsing SI, delusional thinking, and poor judgment, patient is considered a danger to self and possibly others at this time)  Attention Span: Poor  Recall: Poor  Fund of Knowledge: Fair  Language: Fair   Psychomotor Activity  Psychomotor Activity: Psychomotor Activity: Restlessness   Assets  Assets: Communication Skills   Sleep  Sleep: Sleep: Poor Number of Hours of Sleep: 2   Nutritional Assessment (For OBS and FBC admissions only) Has the patient had a weight loss or gain of 10 pounds or more in the last 3 months?: No Has the patient had a decrease in food intake/or appetite?: Yes Does the patient have dental problems?: No Does the patient have eating habits or behaviors that may be indicators of an eating disorder including binging or inducing vomiting?: No Has the patient recently lost weight without trying?: 0 Has the patient been eating poorly because of a decreased appetite?: 0 Malnutrition Screening Tool Score: 0    Physical Exam Vitals and nursing note reviewed.  Constitutional:      Appearance: Normal appearance.  HENT:     Head: Normocephalic and atraumatic.     Nose: Nose normal.  Pulmonary:     Effort:  Pulmonary effort is normal.  Musculoskeletal:  General: Normal range of motion.     Cervical back: Normal range of motion.  Neurological:     General: No focal deficit present.     Mental Status: He is alert and oriented to person, place, and time. Mental status is at baseline.  Psychiatric:        Attention and Perception: He is inattentive. He perceives auditory hallucinations. He does not perceive visual hallucinations.        Mood and Affect: Mood is elated. Affect is inappropriate.        Speech: Speech is rapid and pressured.        Behavior: Behavior is agitated and hyperactive. Behavior is cooperative.        Thought Content: Thought content is paranoid and delusional.        Cognition and Memory: Cognition is impaired. He exhibits impaired recent memory.        Judgment: Judgment is impulsive.    Review of Systems  Psychiatric/Behavioral:  Positive for hallucinations. The patient is nervous/anxious and has insomnia.   All other systems reviewed and are negative.   Blood pressure (!) 140/96, pulse 84, temperature 98.7 F (37.1 C), temperature source Oral, resp. rate 19, SpO2 100%. There is no height or weight on file to calculate BMI.  Past Psychiatric History: Schizoaffective Disorder   Is the patient at risk to self? Yes  Has the patient been a risk to self in the past 6 months? Yes .    Has the patient been a risk to self within the distant past? Yes   Is the patient a risk to others? Yes   Has the patient been a risk to others in the past 6 months? Yes   Has the patient been a risk to others within the distant past? Yes   Past Medical History: Diabetes    Social History: Unemployed Homeless  Last Labs:  Admission on 01/28/2024, Discharged on 01/28/2024  Component Date Value Ref Range Status   Glucose-Capillary 01/28/2024 72  70 - 99 mg/dL Final   Glucose reference range applies only to samples taken after fasting for at least 8 hours.  Admission on  01/26/2024, Discharged on 01/27/2024  Component Date Value Ref Range Status   Group A Strep by PCR 01/26/2024 NOT DETECTED  NOT DETECTED Final   Performed at [facility], [address] [place], [place] [address]  Admission on 01/17/2024, Discharged on 01/18/2024  Component Date Value Ref Range Status   Glucose-Capillary 01/17/2024 71  70 - 99 mg/dL Final   Glucose reference range applies only to samples taken after fasting for at least 8 hours.   Glucose-Capillary 01/18/2024 193 (H)  70 - 99 mg/dL Final   Glucose reference range applies only to samples taken after fasting for at least 8 hours.  Admission on 01/10/2024, Discharged on 01/11/2024  Component Date Value Ref Range Status   Glucose-Capillary 01/10/2024 45 (L)  70 - 99 mg/dL Final   Glucose reference range applies only to samples taken after fasting for at least 8 hours.   Glucose-Capillary 01/11/2024 197 (H)  70 - 99 mg/dL Final   Glucose reference range applies only to samples taken after fasting for at least 8 hours.   Glucose-Capillary 01/11/2024 267 (H)  70 - 99 mg/dL Final   Glucose reference range applies only to samples taken after fasting for at least 8 hours.   Glucose-Capillary 01/11/2024 241 (H)  70 - 99 mg/dL Final   Glucose reference  range applies only to samples taken after fasting for at least 8 hours.  Office Visit on 01/09/2024  Component Date Value Ref Range Status   Creatinine, Urine 01/09/2024 14.8  Not Estab. mg/dL Final   Microalbumin, Urine 01/09/2024 <3.0  Not Estab. ug/mL Final   Microalb/Creat Ratio 01/09/2024 <20  0 - 29 mg/g creat Final   Comment:                        Normal:                0 -  29                        Moderately increased: 30 - 300                        Severely increased:       >300   Admission on 12/26/2023, Discharged on 01/05/2024  Component Date Value Ref Range Status   Cholesterol 12/27/2023 200  0 - 200 mg/dL Final   Triglycerides 60/45/4098 194 (H)  <150  mg/dL Final   HDL 11/91/4782 84  >40 mg/dL Final   Total CHOL/HDL Ratio 12/27/2023 2.4  RATIO Final   VLDL 12/27/2023 39  0 - 40 mg/dL Final   LDL Cholesterol 12/27/2023 77  0 - 99 mg/dL Final   Comment:        Total Cholesterol/HDL:CHD Risk Coronary Heart Disease Risk Table                     Men   Women  1/2 Average Risk   3.4   3.3  Average Risk       5.0   4.4  2 X Average Risk   9.6   7.1  3 X Average Risk  23.4   11.0        Use the calculated Patient Ratio above and the CHD Risk Table to determine the patient's CHD Risk.        ATP III CLASSIFICATION (LDL):  <100     mg/dL   Optimal  956-213  mg/dL   Near or Above                    Optimal  130-159  mg/dL   Borderline  086-578  mg/dL   High  >469     mg/dL   Very High Performed at [facility], [address], [place], [place] [address]    Total Protein 12/27/2023 6.3 (L)  6.5 - 8.1 g/dL Final   Albumin 84/13/2440 3.5  3.5 - 5.0 g/dL Final   AST 08/28/2535 67 (H)  15 - 41 U/L Final   ALT 12/27/2023 52 (H)  0 - 44 U/L Final   Alkaline Phosphatase 12/27/2023 84  38 - 126 U/L Final   Total Bilirubin 12/27/2023 0.5  0.0 - 1.2 mg/dL Final   Bilirubin, Direct 12/27/2023 <0.1  0.0 - 0.2 mg/dL Final   Indirect Bilirubin 12/27/2023 NOT CALCULATED  0.3 - 0.9 mg/dL Final   Performed at [facility], [address], [place], [place] [address]   TSH 12/27/2023 3.661  0.350 - 4.500 uIU/mL Final   Comment: Performed by a 3rd Generation assay with a functional sensitivity of <=0.01 uIU/mL. Performed at [facility], [address] [place], [place] [address]    Prolactin  12/27/2023 18.7  3.9 - 22.7 ng/mL Final   Comment: (NOTE) Performed At: [facility] [address] [place], [place] [id number] Jolene Schimke MD WU:[id number]    Glucose-Capillary 12/26/2023 594 (HH)  70 - 99 mg/dL Final   Glucose reference range applies only to samples taken after fasting for  at least 8 hours.   Comment 1 12/26/2023 Notify RN   Final   Glucose-Capillary 12/27/2023 >600 (HH)  70 - 99 mg/dL Final   Glucose reference range applies only to samples taken after fasting for at least 8 hours.   Glucose-Capillary 12/26/2023 332 (H)  70 - 99 mg/dL Final   Glucose reference range applies only to samples taken after fasting for at least 8 hours.   Glucose-Capillary 12/27/2023 164 (H)  70 - 99 mg/dL Final   Glucose reference range applies only to samples taken after fasting for at least 8 hours.   Glucose-Capillary 12/27/2023 >600 (HH)  70 - 99 mg/dL Final   Glucose reference range applies only to samples taken after fasting for at least 8 hours.   Glucose-Capillary 12/27/2023 >600 (HH)  70 - 99 mg/dL Final   Glucose reference range applies only to samples taken after fasting for at least 8 hours.   pH, Ven 12/27/2023 7.44 (H)  7.25 - 7.43 Final   pCO2, Ven 12/27/2023 46  44 - 60 mmHg Final   pO2, Ven 12/27/2023 122 (H)  32 - 45 mmHg Final   Bicarbonate 12/27/2023 31.2 (H)  20.0 - 28.0 mmol/L Final   Acid-Base Excess 12/27/2023 6.1 (H)  0.0 - 2.0 mmol/L Final   O2 Saturation 12/27/2023 99.2  % Final   Patient temperature 12/27/2023 37.0   Final   Performed at [facility], [address] [place], [place] [address]   Color, Urine 12/27/2023 COLORLESS (A)  YELLOW Final   APPearance 12/27/2023 CLEAR  CLEAR Final   Specific Gravity, Urine 12/27/2023 1.026  1.005 - 1.030 Final   pH 12/27/2023 7.0  5.0 - 8.0 Final   Glucose, UA 12/27/2023 >=500 (A)  NEGATIVE mg/dL Final   Hgb urine dipstick 12/27/2023 NEGATIVE  NEGATIVE Final   Bilirubin Urine 12/27/2023 NEGATIVE  NEGATIVE Final   Ketones, ur 12/27/2023 NEGATIVE  NEGATIVE mg/dL Final   Protein, ur 30/86/5784 NEGATIVE  NEGATIVE mg/dL Final   Nitrite 69/62/9528 NEGATIVE  NEGATIVE Final   Leukocytes,Ua 12/27/2023 NEGATIVE  NEGATIVE Final   RBC / HPF 12/27/2023 0-5  0 - 5 RBC/hpf Final   WBC, UA 12/27/2023  0-5  0 - 5 WBC/hpf Final   Bacteria, UA 12/27/2023 NONE SEEN  NONE SEEN Final   Squamous Epithelial / HPF 12/27/2023 0-5  0 - 5 /HPF Final   Performed at [facility], [address], [place], [place] [address]   Beta-Hydroxybutyric Acid 12/27/2023 0.17  0.05 - 0.27 mmol/L Final   Performed at [facility], [address], [place], [place] [address]   Glucose-Capillary 12/27/2023 310 (H)  70 - 99 mg/dL Final   Glucose reference range applies only to samples taken after fasting for at least 8 hours.   Glucose-Capillary 12/27/2023 333 (H)  70 - 99 mg/dL Final   Glucose reference range applies only to samples taken after fasting for at least 8 hours.   Glucose-Capillary 12/27/2023 367 (H)  70 - 99 mg/dL Final   Glucose reference range applies only to samples taken after fasting for at least 8 hours.   Comment 1 12/27/2023 Notify  RN   Final   Glucose-Capillary 12/28/2023 42 (LL)  70 - 99 mg/dL Final   Glucose reference range applies only to samples taken after fasting for at least 8 hours.   Comment 1 12/28/2023 Notify RN   Final   Glucose-Capillary 12/28/2023 70  70 - 99 mg/dL Final   Glucose reference range applies only to samples taken after fasting for at least 8 hours.   Glucose-Capillary 12/28/2023 364 (H)  70 - 99 mg/dL Final   Glucose reference range applies only to samples taken after fasting for at least 8 hours.   Glucose-Capillary 12/28/2023 429 (H)  70 - 99 mg/dL Final   Glucose reference range applies only to samples taken after fasting for at least 8 hours.   Glucose-Capillary 12/28/2023 399 (H)  70 - 99 mg/dL Final   Glucose reference range applies only to samples taken after fasting for at least 8 hours.   Glucose-Capillary 12/28/2023 167 (H)  70 - 99 mg/dL Final   Glucose reference range applies only to samples taken after fasting for at least 8 hours.   Glucose-Capillary 12/28/2023 195 (H)  70 - 99 mg/dL Final   Glucose reference range  applies only to samples taken after fasting for at least 8 hours.   Sodium 12/29/2023 133 (L)  135 - 145 mmol/L Final   Potassium 12/29/2023 3.9  3.5 - 5.1 mmol/L Final   Chloride 12/29/2023 98  98 - 111 mmol/L Final   CO2 12/29/2023 26  22 - 32 mmol/L Final   Glucose, Bld 12/29/2023 77  70 - 99 mg/dL Final   Glucose reference range applies only to samples taken after fasting for at least 8 hours.   BUN 12/29/2023 15  6 - 20 mg/dL Final   Creatinine, Ser 12/29/2023 0.55 (L)  0.61 - 1.24 mg/dL Final   Calcium 96/01/5408 8.5 (L)  8.9 - 10.3 mg/dL Final   GFR, Estimated 12/29/2023 >60  >60 mL/min Final   Comment: (NOTE) Calculated using the CKD-EPI Creatinine Equation (2021)    Anion gap 12/29/2023 9  5 - 15 Final   Performed at [facility], [address], [place], [place] [address]   Glucose-Capillary 12/28/2023 208 (H)  70 - 99 mg/dL Final   Glucose reference range applies only to samples taken after fasting for at least 8 hours.   Comment 1 12/28/2023 Notify RN   Final   Glucose-Capillary 12/29/2023 89  70 - 99 mg/dL Final   Glucose reference range applies only to samples taken after fasting for at least 8 hours.   Comment 1 12/29/2023 Notify RN   Final   Glucose-Capillary 12/29/2023 163 (H)  70 - 99 mg/dL Final   Glucose reference range applies only to samples taken after fasting for at least 8 hours.   Glucose-Capillary 12/29/2023 325 (H)  70 - 99 mg/dL Final   Glucose reference range applies only to samples taken after fasting for at least 8 hours.   Sodium 12/30/2023 127 (L)  135 - 145 mmol/L Final   Potassium 12/30/2023 4.0  3.5 - 5.1 mmol/L Final   Chloride 12/30/2023 94 (L)  98 - 111 mmol/L Final   CO2 12/30/2023 25  22 - 32 mmol/L Final   Glucose, Bld 12/30/2023 368 (H)  70 - 99 mg/dL Final   Glucose reference range applies only to samples taken after fasting for at least 8 hours.   BUN 12/30/2023 16  6 - 20 mg/dL Final   Creatinine,  Ser 12/30/2023 0.75   0.61 - 1.24 mg/dL Final   Calcium 16/08/9603 8.7 (L)  8.9 - 10.3 mg/dL Final   GFR, Estimated 12/30/2023 >60  >60 mL/min Final   Comment: (NOTE) Calculated using the CKD-EPI Creatinine Equation (2021)    Anion gap 12/30/2023 8  5 - 15 Final   Performed at [facility], [address] [place], [place] [address]   Glucose-Capillary 12/29/2023 254 (H)  70 - 99 mg/dL Final   Glucose reference range applies only to samples taken after fasting for at least 8 hours.   Glucose-Capillary 12/30/2023 373 (H)  70 - 99 mg/dL Final   Glucose reference range applies only to samples taken after fasting for at least 8 hours.   Glucose-Capillary 12/30/2023 423 (H)  70 - 99 mg/dL Final   Glucose reference range applies only to samples taken after fasting for at least 8 hours.   Glucose-Capillary 12/30/2023 341 (H)  70 - 99 mg/dL Final   Glucose reference range applies only to samples taken after fasting for at least 8 hours.   Glucose-Capillary 12/30/2023 317 (H)  70 - 99 mg/dL Final   Glucose reference range applies only to samples taken after fasting for at least 8 hours.   Sodium 12/31/2023 135  135 - 145 mmol/L Final   DELTA CHECK NOTED   Potassium 12/31/2023 4.1  3.5 - 5.1 mmol/L Final   Chloride 12/31/2023 98  98 - 111 mmol/L Final   CO2 12/31/2023 29  22 - 32 mmol/L Final   Glucose, Bld 12/31/2023 94  70 - 99 mg/dL Final   Glucose reference range applies only to samples taken after fasting for at least 8 hours.   BUN 12/31/2023 12  6 - 20 mg/dL Final   Creatinine, Ser 12/31/2023 0.68  0.61 - 1.24 mg/dL Final   Calcium 11/91/4782 8.9  8.9 - 10.3 mg/dL Final   GFR, Estimated 12/31/2023 >60  >60 mL/min Final   Comment: (NOTE) Calculated using the CKD-EPI Creatinine Equation (2021)    Anion gap 12/31/2023 8  5 - 15 Final   Performed at [facility], [address] [place], [place] [address]   Glucose-Capillary 12/30/2023 167 (H)  70 - 99 mg/dL Final    Glucose reference range applies only to samples taken after fasting for at least 8 hours.   Glucose-Capillary 12/30/2023 146 (H)  70 - 99 mg/dL Final   Glucose reference range applies only to samples taken after fasting for at least 8 hours.   Glucose-Capillary 12/31/2023 70  70 - 99 mg/dL Final   Glucose reference range applies only to samples taken after fasting for at least 8 hours.   Glucose-Capillary 12/31/2023 249 (H)  70 - 99 mg/dL Final   Glucose reference range applies only to samples taken after fasting for at least 8 hours.   Glucose-Capillary 12/31/2023 315 (H)  70 - 99 mg/dL Final   Glucose reference range applies only to samples taken after fasting for at least 8 hours.   Glucose-Capillary 12/31/2023 164 (H)  70 - 99 mg/dL Final   Glucose reference range applies only to samples taken after fasting for at least 8 hours.   Glucose-Capillary 12/31/2023 91  70 - 99 mg/dL Final   Glucose reference range applies only to samples taken after fasting for at least 8 hours.   Glucose-Capillary 01/01/2024 149 (H)  70 - 99 mg/dL Final   Glucose reference range applies only to samples taken after  fasting for at least 8 hours.   Glucose-Capillary 01/01/2024 225 (H)  70 - 99 mg/dL Final   Glucose reference range applies only to samples taken after fasting for at least 8 hours.   Glucose-Capillary 01/01/2024 306 (H)  70 - 99 mg/dL Final   Glucose reference range applies only to samples taken after fasting for at least 8 hours.   Glucose-Capillary 01/01/2024 56 (L)  70 - 99 mg/dL Final   Glucose reference range applies only to samples taken after fasting for at least 8 hours.   Glucose-Capillary 01/01/2024 79  70 - 99 mg/dL Final   Glucose reference range applies only to samples taken after fasting for at least 8 hours.   Glucose-Capillary 01/01/2024 206 (H)  70 - 99 mg/dL Final   Glucose reference range applies only to samples taken after fasting for at least 8 hours.   Glucose-Capillary  01/02/2024 94  70 - 99 mg/dL Final   Glucose reference range applies only to samples taken after fasting for at least 8 hours.   Glucose-Capillary 01/02/2024 252 (H)  70 - 99 mg/dL Final   Glucose reference range applies only to samples taken after fasting for at least 8 hours.   Folate 01/02/2024 9.4  >5.9 ng/mL Final   Performed at [facility], [address] [place], [place] [address]   RPR Ser Ql 01/02/2024 NON REACTIVE  NON REACTIVE Final   Performed at [facility], [address], [place], [place] [address]   Vitamin B-12 01/02/2024 800  180 - 914 pg/mL Final   Comment: (NOTE) This assay is not validated for testing neonatal or myeloproliferative syndrome specimens for Vitamin B12 levels. Performed at [facility], [address] [place], [place] [address]    Vit D, 25-Hydroxy 01/02/2024 18.55 (L)  30 - 100 ng/mL Final   Comment: (NOTE) Vitamin D deficiency has been defined by the Institute of Medicine  and an Endocrine Society practice guideline as a level of serum 25-OH  vitamin D less than 20 ng/mL (1,2). The Endocrine Society went on to  further define vitamin D insufficiency as a level between 21 and 29  ng/mL (2).  1. IOM (Institute of Medicine). 2010. Dietary reference intakes for  calcium and D. [place]: [employer]. 2. Holick MF, Binkley [place], Bischoff-Ferrari HA, et al. Evaluation,  treatment, and prevention of vitamin D deficiency: an Endocrine  Society clinical practice guideline, JCEM. 2011 Jul; 96(7): 1911-30.  Performed at [facility], [address], [place], [place] [address]    Glucose-Capillary 01/02/2024 235 (H)  70 - 99 mg/dL Final   Glucose reference range applies only to samples taken after fasting for at least 8 hours.   Glucose-Capillary 01/02/2024 98  70 - 99 mg/dL Final   Glucose reference range applies only to samples taken after fasting for at least 8 hours.    Glucose-Capillary 01/03/2024 226 (H)  70 - 99 mg/dL Final   Glucose reference range applies only to samples taken after fasting for at least 8 hours.   Glucose-Capillary 01/03/2024 361 (H)  70 - 99 mg/dL Final   Glucose reference range applies only to samples taken after fasting for at least 8 hours.   Glucose-Capillary 01/03/2024 220 (H)  70 - 99 mg/dL Final   Glucose reference range applies only to samples taken after fasting for at least 8 hours.   Glucose-Capillary 01/03/2024 138 (H)  70 - 99 mg/dL Final  Glucose reference range applies only to samples taken after fasting for at least 8 hours.   Glucose-Capillary 01/04/2024 47 (L)  70 - 99 mg/dL Final   Glucose reference range applies only to samples taken after fasting for at least 8 hours.   Glucose-Capillary 01/04/2024 114 (H)  70 - 99 mg/dL Final   Glucose reference range applies only to samples taken after fasting for at least 8 hours.   Glucose-Capillary 01/04/2024 255 (H)  70 - 99 mg/dL Final   Glucose reference range applies only to samples taken after fasting for at least 8 hours.   Glucose-Capillary 01/04/2024 269 (H)  70 - 99 mg/dL Final   Glucose reference range applies only to samples taken after fasting for at least 8 hours.   Glucose-Capillary 01/04/2024 186 (H)  70 - 99 mg/dL Final   Glucose reference range applies only to samples taken after fasting for at least 8 hours.   Glucose-Capillary 01/04/2024 303 (H)  70 - 99 mg/dL Final   Glucose reference range applies only to samples taken after fasting for at least 8 hours.   Glucose-Capillary 01/04/2024 179 (H)  70 - 99 mg/dL Final   Glucose reference range applies only to samples taken after fasting for at least 8 hours.   Glucose-Capillary 01/04/2024 220 (H)  70 - 99 mg/dL Final   Glucose reference range applies only to samples taken after fasting for at least 8 hours.   Comment 1 01/04/2024 Notify RN   Final   Glucose-Capillary 01/05/2024 82  70 - 99 mg/dL Final    Glucose reference range applies only to samples taken after fasting for at least 8 hours.   Comment 1 01/05/2024 Notify RN   Final   Glucose-Capillary 01/05/2024 396 (H)  70 - 99 mg/dL Final   Glucose reference range applies only to samples taken after fasting for at least 8 hours.   Glucose-Capillary 01/05/2024 306 (H)  70 - 99 mg/dL Final   Glucose reference range applies only to samples taken after fasting for at least 8 hours.   Glucose-Capillary 01/05/2024 350 (H)  70 - 99 mg/dL Final   Glucose reference range applies only to samples taken after fasting for at least 8 hours.  Admission on 12/23/2023, Discharged on 12/26/2023  Component Date Value Ref Range Status   Glucose-Capillary 12/23/2023 >600 (HH)  70 - 99 mg/dL Final   Glucose reference range applies only to samples taken after fasting for at least 8 hours.   WBC 12/23/2023 10.7 (H)  4.0 - 10.5 K/uL Final   RBC 12/23/2023 5.80  4.22 - 5.81 MIL/uL Final   Hemoglobin 12/23/2023 16.9  13.0 - 17.0 g/dL Final   HCT 16/08/9603 51.7  39.0 - 52.0 % Final   MCV 12/23/2023 89.1  80.0 - 100.0 fL Final   MCH 12/23/2023 29.1  26.0 - 34.0 pg Final   MCHC 12/23/2023 32.7  30.0 - 36.0 g/dL Final   RDW 54/07/8118 12.1  11.5 - 15.5 % Final   Platelets 12/23/2023 464 (H)  150 - 400 K/uL Final   nRBC 12/23/2023 0.0  0.0 - 0.2 % Final   Performed at [facility], [address], [place], [place] [address]   Color, Urine 12/23/2023 STRAW (A)  YELLOW Final   APPearance 12/23/2023 CLEAR  CLEAR Final   Specific Gravity, Urine 12/23/2023 1.022  1.005 - 1.030 Final   pH 12/23/2023 5.0  5.0 - 8.0 Final   Glucose, UA 12/23/2023 >=500 (A)  NEGATIVE mg/dL Final   Hgb urine dipstick 12/23/2023 SMALL (A)  NEGATIVE Final   Bilirubin Urine 12/23/2023 NEGATIVE  NEGATIVE Final   Ketones, ur 12/23/2023 80 (A)  NEGATIVE mg/dL Final   Protein, ur 16/08/9603 30 (A)  NEGATIVE mg/dL Final   Nitrite 54/07/8118 NEGATIVE  NEGATIVE Final   Leukocytes,Ua  12/23/2023 NEGATIVE  NEGATIVE Final   RBC / HPF 12/23/2023 0-5  0 - 5 RBC/hpf Final   WBC, UA 12/23/2023 0-5  0 - 5 WBC/hpf Final   Bacteria, UA 12/23/2023 NONE SEEN  NONE SEEN Final   Squamous Epithelial / HPF 12/23/2023 0-5  0 - 5 /HPF Final   Mucus 12/23/2023 PRESENT   Final   Hyaline Casts, UA 12/23/2023 PRESENT   Final   Performed at [facility], [address] [place], [place] [address]   Glucose-Capillary 12/23/2023 472 (H)  70 - 99 mg/dL Final   Glucose reference range applies only to samples taken after fasting for at least 8 hours.   Sodium 12/23/2023 128 (L)  135 - 145 mmol/L Final   Potassium 12/23/2023 6.0 (H)  3.5 - 5.1 mmol/L Final   Chloride 12/23/2023 100  98 - 111 mmol/L Final   BUN 12/23/2023 38 (H)  6 - 20 mg/dL Final   Creatinine, Ser 12/23/2023 1.40 (H)  0.61 - 1.24 mg/dL Final   Glucose, Bld 95/62/1308 >700 (HH)  70 - 99 mg/dL Final   Glucose reference range applies only to samples taken after fasting for at least 8 hours.   Calcium, Ion 12/23/2023 1.08 (L)  1.15 - 1.40 mmol/L Final   TCO2 12/23/2023 12 (L)  22 - 32 mmol/L Final   Hemoglobin 12/23/2023 18.7 (H)  13.0 - 17.0 g/dL Final   HCT 65/78/4696 55.0 (H)  39.0 - 52.0 % Final   Comment 12/23/2023 NOTIFIED PHYSICIAN   Final   pH, Ven 12/23/2023 7.181 (LL)  7.25 - 7.43 Final   pCO2, Ven 12/23/2023 28.8 (L)  44 - 60 mmHg Final   pO2, Ven 12/23/2023 79 (H)  32 - 45 mmHg Final   Bicarbonate 12/23/2023 10.8 (L)  20.0 - 28.0 mmol/L Final   TCO2 12/23/2023 12 (L)  22 - 32 mmol/L Final   O2 Saturation 12/23/2023 92  % Final   Acid-base deficit 12/23/2023 16.0 (H)  0.0 - 2.0 mmol/L Final   Sodium 12/23/2023 125 (L)  135 - 145 mmol/L Final   Potassium 12/23/2023 6.0 (H)  3.5 - 5.1 mmol/L Final   Calcium, Ion 12/23/2023 1.11 (L)  1.15 - 1.40 mmol/L Final   HCT 12/23/2023 53.0 (H)  39.0 - 52.0 % Final   Hemoglobin 12/23/2023 18.0 (H)  13.0 - 17.0 g/dL Final   Sample type 29/52/8413 VENOUS   Final   Comment  12/23/2023 NOTIFIED PHYSICIAN   Final   Beta-Hydroxybutyric Acid 12/23/2023 >8.00 (H)  0.05 - 0.27 mmol/L Final   Comment: RESULT CONFIRMED BY MANUAL DILUTION Performed at [facility], [address], [place], [place] [address]    Lipase 12/23/2023 19  11 - 51 U/L Final   Performed at [facility], [address] [place], [place] [address]   Sodium 12/23/2023 130 (L)  135 - 145 mmol/L Final   Potassium 12/23/2023 6.2 (H)  3.5 - 5.1 mmol/L Final   Chloride 12/23/2023 86 (L)  98 - 111 mmol/L Final   CO2 12/23/2023 10 (L)  22 - 32 mmol/L Final   Glucose, Bld 12/23/2023  766 (HH)  70 - 99 mg/dL Final   Comment: CRITICAL RESULT CALLED TO, READ BACK BY AND VERIFIED WITH Harrington Challenger RN, @[id number] , 12/23/23, [place],T. Glucose reference range applies only to samples taken after fasting for at least 8 hours.    BUN 12/23/2023 35 (H)  6 - 20 mg/dL Final   Creatinine, Ser 12/23/2023 2.21 (H)  0.61 - 1.24 mg/dL Final   Calcium 16/08/9603 10.0  8.9 - 10.3 mg/dL Final   GFR, Estimated 12/23/2023 40 (L)  >60 mL/min Final   Comment: (NOTE) Calculated using the CKD-EPI Creatinine Equation (2021)    Anion gap 12/23/2023 34 (H)  5 - 15 Final   Comment: ELECTROLYTES REPEATED TO VERIFY Performed at [facility], [address], [place], [place] [address]    Sodium 12/23/2023 139  135 - 145 mmol/L Final   DELTA CHECK NOTED   Potassium 12/23/2023 4.2  3.5 - 5.1 mmol/L Final   Chloride 12/23/2023 103  98 - 111 mmol/L Final   CO2 12/23/2023 13 (L)  22 - 32 mmol/L Final   Glucose, Bld 12/23/2023 371 (H)  70 - 99 mg/dL Final   Glucose reference range applies only to samples taken after fasting for at least 8 hours.   BUN 12/23/2023 33 (H)  6 - 20 mg/dL Final   Creatinine, Ser 12/23/2023 1.80 (H)  0.61 - 1.24 mg/dL Final   Calcium 11/91/4782 9.3  8.9 - 10.3 mg/dL Final   GFR, Estimated 12/23/2023 51 (L)  >60 mL/min Final   Comment: (NOTE) Calculated using the CKD-EPI Creatinine Equation (2021)     Anion gap 12/23/2023 23 (H)  5 - 15 Final   Comment: ELECTROLYTES REPEATED TO VERIFY Performed at [facility], [address], [place], [place] [address]    Sodium 12/23/2023 142  135 - 145 mmol/L Final   DELTA CHECK NOTED   Potassium 12/23/2023 4.4  3.5 - 5.1 mmol/L Final   Chloride 12/23/2023 103  98 - 111 mmol/L Final   CO2 12/23/2023 25  22 - 32 mmol/L Final   Glucose, Bld 12/23/2023 213 (H)  70 - 99 mg/dL Final   Glucose reference range applies only to samples taken after fasting for at least 8 hours.   BUN 12/23/2023 27 (H)  6 - 20 mg/dL Final   Creatinine, Ser 12/23/2023 1.24  0.61 - 1.24 mg/dL Final   Calcium 30/86/5784 9.9  8.9 - 10.3 mg/dL Final   GFR, Estimated 12/23/2023 >60  >60 mL/min Final   Comment: (NOTE) Calculated using the CKD-EPI Creatinine Equation (2021)    Anion gap 12/23/2023 14  5 - 15 Final   Performed at [facility], [address] [place], [place] [address]   Sodium 12/24/2023 143  135 - 145 mmol/L Final   Potassium 12/24/2023 4.4  3.5 - 5.1 mmol/L Final   Chloride 12/24/2023 104  98 - 111 mmol/L Final   CO2 12/24/2023 27  22 - 32 mmol/L Final   Glucose, Bld 12/24/2023 226 (H)  70 - 99 mg/dL Final   Glucose reference range applies only to samples taken after fasting for at least 8 hours.   BUN 12/24/2023 25 (H)  6 - 20 mg/dL Final   Creatinine, Ser 12/24/2023 1.12  0.61 - 1.24 mg/dL Final   Calcium 52/84/1324 9.9  8.9 - 10.3 mg/dL Final   GFR, Estimated 12/24/2023 >60  >60 mL/min Final   Comment: (NOTE) Calculated using the CKD-EPI Creatinine Equation (2021)  Anion gap 12/24/2023 12  5 - 15 Final   Performed at [facility], [address], [place], [place] [address]   Opiates 12/23/2023 NONE DETECTED  NONE DETECTED Final   Cocaine 12/23/2023 NONE DETECTED  NONE DETECTED Final   Benzodiazepines 12/23/2023 NONE DETECTED  NONE DETECTED Final   Amphetamines 12/23/2023 NONE DETECTED  NONE DETECTED Final   Tetrahydrocannabinol  12/23/2023 NONE DETECTED  NONE DETECTED Final   Barbiturates 12/23/2023 NONE DETECTED  NONE DETECTED Final   Comment: (NOTE) DRUG SCREEN FOR MEDICAL PURPOSES ONLY.  IF CONFIRMATION IS NEEDED FOR ANY PURPOSE, NOTIFY LAB WITHIN 5 DAYS.  LOWEST DETECTABLE LIMITS FOR URINE DRUG SCREEN Drug Class                     Cutoff (ng/mL) Amphetamine and metabolites    1000 Barbiturate and metabolites    200 Benzodiazepine                 200 Opiates and metabolites        300 Cocaine and metabolites        300 THC                            50 Performed at [facility], [address] [place], [place] [address]    Alcohol, Ethyl (B) 12/23/2023 <10  <10 mg/dL Final   Comment: (NOTE) Lowest detectable limit for serum alcohol is 10 mg/dL.  For medical purposes only. Performed at [facility], [address], [place], [place] [address]    Glucose-Capillary 12/23/2023 486 (H)  70 - 99 mg/dL Final   Glucose reference range applies only to samples taken after fasting for at least 8 hours.   Glucose-Capillary 12/23/2023 355 (H)  70 - 99 mg/dL Final   Glucose reference range applies only to samples taken after fasting for at least 8 hours.   Sodium 12/24/2023 143  135 - 145 mmol/L Final   Potassium 12/24/2023 4.0  3.5 - 5.1 mmol/L Final   Chloride 12/24/2023 106  98 - 111 mmol/L Final   CO2 12/24/2023 28  22 - 32 mmol/L Final   Glucose, Bld 12/24/2023 209 (H)  70 - 99 mg/dL Final   Glucose reference range applies only to samples taken after fasting for at least 8 hours.   BUN 12/24/2023 24 (H)  6 - 20 mg/dL Final   Creatinine, Ser 12/24/2023 1.02  0.61 - 1.24 mg/dL Final   Calcium 21/30/8657 9.4  8.9 - 10.3 mg/dL Final   GFR, Estimated 12/24/2023 >60  >60 mL/min Final   Comment: (NOTE) Calculated using the CKD-EPI Creatinine Equation (2021)    Anion gap 12/24/2023 9  5 - 15 Final   Performed at [facility], [address], [place], [place] [address]   Glucose-Capillary  12/23/2023 247 (H)  70 - 99 mg/dL Final   Glucose reference range applies only to samples taken after fasting for at least 8 hours.   Glucose-Capillary 12/23/2023 206 (H)  70 - 99 mg/dL Final   Glucose reference range applies only to samples taken after fasting for at least 8 hours.   Glucose-Capillary 12/23/2023 199 (H)  70 - 99 mg/dL Final   Glucose reference range applies only to samples taken after fasting for at least 8 hours.   Glucose-Capillary 12/23/2023 182 (H)  70 - 99 mg/dL Final   Glucose reference range  applies only to samples taken after fasting for at least 8 hours.   Glucose-Capillary 12/24/2023 187 (H)  70 - 99 mg/dL Final   Glucose reference range applies only to samples taken after fasting for at least 8 hours.   Glucose-Capillary 12/24/2023 203 (H)  70 - 99 mg/dL Final   Glucose reference range applies only to samples taken after fasting for at least 8 hours.   Glucose-Capillary 12/24/2023 203 (H)  70 - 99 mg/dL Final   Glucose reference range applies only to samples taken after fasting for at least 8 hours.   Glucose-Capillary 12/24/2023 145 (H)  70 - 99 mg/dL Final   Glucose reference range applies only to samples taken after fasting for at least 8 hours.   Glucose-Capillary 12/24/2023 168 (H)  70 - 99 mg/dL Final   Glucose reference range applies only to samples taken after fasting for at least 8 hours.   Glucose-Capillary 12/24/2023 193 (H)  70 - 99 mg/dL Final   Glucose reference range applies only to samples taken after fasting for at least 8 hours.   Beta-Hydroxybutyric Acid 12/24/2023 0.38 (H)  0.05 - 0.27 mmol/L Final   Performed at [facility], [address] [place], [place] [address]   Glucose-Capillary 12/24/2023 193 (H)  70 - 99 mg/dL Final   Glucose reference range applies only to samples taken after fasting for at least 8 hours.   Glucose-Capillary 12/24/2023 179 (H)  70 - 99 mg/dL Final   Glucose reference range applies only to samples taken after  fasting for at least 8 hours.   Glucose-Capillary 12/24/2023 340 (H)  70 - 99 mg/dL Final   Glucose reference range applies only to samples taken after fasting for at least 8 hours.   Glucose-Capillary 12/24/2023 303 (H)  70 - 99 mg/dL Final   Glucose reference range applies only to samples taken after fasting for at least 8 hours.   Sodium 12/25/2023 132 (L)  135 - 145 mmol/L Final   DELTA CHECK NOTED   Potassium 12/25/2023 3.7  3.5 - 5.1 mmol/L Final   Chloride 12/25/2023 97 (L)  98 - 111 mmol/L Final   CO2 12/25/2023 28  22 - 32 mmol/L Final   Glucose, Bld 12/25/2023 162 (H)  70 - 99 mg/dL Final   Glucose reference range applies only to samples taken after fasting for at least 8 hours.   BUN 12/25/2023 15  6 - 20 mg/dL Final   Creatinine, Ser 12/25/2023 0.79  0.61 - 1.24 mg/dL Final   Calcium 96/29/5284 8.7 (L)  8.9 - 10.3 mg/dL Final   GFR, Estimated 12/25/2023 >60  >60 mL/min Final   Comment: (NOTE) Calculated using the CKD-EPI Creatinine Equation (2021)    Anion gap 12/25/2023 7  5 - 15 Final   Performed at [facility], [address] [place], [place] [address]   Glucose-Capillary 12/24/2023 354 (H)  70 - 99 mg/dL Final   Glucose reference range applies only to samples taken after fasting for at least 8 hours.   Glucose-Capillary 12/25/2023 252 (H)  70 - 99 mg/dL Final   Glucose reference range applies only to samples taken after fasting for at least 8 hours.   Glucose-Capillary 12/25/2023 311 (H)  70 - 99 mg/dL Final   Glucose reference range applies only to samples taken after fasting for at least 8 hours.   Sodium 12/26/2023 132 (L)  135 - 145 mmol/L Final   Potassium 12/26/2023 4.0  3.5 - 5.1 mmol/L Final   Chloride 12/26/2023 95 (L)  98 - 111 mmol/L Final   CO2 12/26/2023 30  22 - 32 mmol/L Final   Glucose, Bld 12/26/2023 264 (H)  70 - 99 mg/dL Final   Glucose reference range applies only to samples taken after fasting for at least 8 hours.   BUN 12/26/2023 16  6 -  20 mg/dL Final   Creatinine, Ser 12/26/2023 0.85  0.61 - 1.24 mg/dL Final   Calcium 08/65/7846 8.8 (L)  8.9 - 10.3 mg/dL Final   GFR, Estimated 12/26/2023 >60  >60 mL/min Final   Comment: (NOTE) Calculated using the CKD-EPI Creatinine Equation (2021)    Anion gap 12/26/2023 7  5 - 15 Final   Performed at [facility], [address] [place], [place] [address]   WBC 12/26/2023 3.6 (L)  4.0 - 10.5 K/uL Final   RBC 12/26/2023 4.41  4.22 - 5.81 MIL/uL Final   Hemoglobin 12/26/2023 13.0  13.0 - 17.0 g/dL Final   HCT 28/41/3244 38.4 (L)  39.0 - 52.0 % Final   MCV 12/26/2023 87.1  80.0 - 100.0 fL Final   MCH 12/26/2023 29.5  26.0 - 34.0 pg Final   MCHC 12/26/2023 33.9  30.0 - 36.0 g/dL Final   RDW 11/03/7251 11.9  11.5 - 15.5 % Final   Platelets 12/26/2023 240  150 - 400 K/uL Final   nRBC 12/26/2023 0.0  0.0 - 0.2 % Final   Performed at [facility], [address], [place], [place] [address]   Glucose-Capillary 12/25/2023 82  70 - 99 mg/dL Final   Glucose reference range applies only to samples taken after fasting for at least 8 hours.   Glucose-Capillary 12/25/2023 514 (HH)  70 - 99 mg/dL Final   Glucose reference range applies only to samples taken after fasting for at least 8 hours.   Glucose-Capillary 12/23/2023 360 (H)  70 - 99 mg/dL Final   Glucose reference range applies only to samples taken after fasting for at least 8 hours.   Glucose-Capillary 12/26/2023 343 (H)  70 - 99 mg/dL Final   Glucose reference range applies only to samples taken after fasting for at least 8 hours.  Admission on 12/16/2023, Discharged on 12/17/2023  Component Date Value Ref Range Status   Glucose-Capillary 12/16/2023 471 (H)  70 - 99 mg/dL Final   Glucose reference range applies only to samples taken after fasting for at least 8 hours.   WBC 12/16/2023 7.4  4.0 - 10.5 K/uL Final   RBC 12/16/2023 5.03  4.22 - 5.81 MIL/uL Final   Hemoglobin 12/16/2023 14.6  13.0 - 17.0 g/dL Final   HCT  34/74/2595 44.1  39.0 - 52.0 % Final   MCV 12/16/2023 87.7  80.0 - 100.0 fL Final   MCH 12/16/2023 29.0  26.0 - 34.0 pg Final   MCHC 12/16/2023 33.1  30.0 - 36.0 g/dL Final   RDW 63/87/5643 11.9  11.5 - 15.5 % Final   Platelets 12/16/2023 458 (H)  150 - 400 K/uL Final   nRBC 12/16/2023 0.0  0.0 - 0.2 % Final   Neutrophils Relative % 12/16/2023 68  % Final   Neutro Abs 12/16/2023 5.0  1.7 - 7.7 K/uL Final   Lymphocytes Relative 12/16/2023 26  % Final   Lymphs Abs 12/16/2023 1.9  0.7 - 4.0 K/uL Final   Monocytes Relative 12/16/2023 5  % Final   Monocytes Absolute 12/16/2023 0.4  0.1 -  1.0 K/uL Final   Eosinophils Relative 12/16/2023 0  % Final   Eosinophils Absolute 12/16/2023 0.0  0.0 - 0.5 K/uL Final   Basophils Relative 12/16/2023 1  % Final   Basophils Absolute 12/16/2023 0.1  0.0 - 0.1 K/uL Final   Immature Granulocytes 12/16/2023 0  % Final   Abs Immature Granulocytes 12/16/2023 0.01  0.00 - 0.07 K/uL Final   Performed at [facility], [address] [place], [place] [address]   Color, Urine 12/16/2023 STRAW (A)  YELLOW Final   APPearance 12/16/2023 CLEAR  CLEAR Final   Specific Gravity, Urine 12/16/2023 1.026  1.005 - 1.030 Final   pH 12/16/2023 5.0  5.0 - 8.0 Final   Glucose, UA 12/16/2023 >=500 (A)  NEGATIVE mg/dL Final   Hgb urine dipstick 12/16/2023 NEGATIVE  NEGATIVE Final   Bilirubin Urine 12/16/2023 NEGATIVE  NEGATIVE Final   Ketones, ur 12/16/2023 80 (A)  NEGATIVE mg/dL Final   Protein, ur 13/06/6577 NEGATIVE  NEGATIVE mg/dL Final   Nitrite 46/96/2952 NEGATIVE  NEGATIVE Final   Leukocytes,Ua 12/16/2023 NEGATIVE  NEGATIVE Final   RBC / HPF 12/16/2023 0-5  0 - 5 RBC/hpf Final   WBC, UA 12/16/2023 0-5  0 - 5 WBC/hpf Final   Bacteria, UA 12/16/2023 NONE SEEN  NONE SEEN Final   Squamous Epithelial / HPF 12/16/2023 0-5  0 - 5 /HPF Final   Performed at [facility], [address], [place], [place] [address]   Sodium 12/16/2023 130 (L)  135 - 145 mmol/L Final    Potassium 12/16/2023 4.9  3.5 - 5.1 mmol/L Final   HEMOLYSIS AT THIS LEVEL MAY AFFECT RESULT   Chloride 12/16/2023 92 (L)  98 - 111 mmol/L Final   CO2 12/16/2023 19 (L)  22 - 32 mmol/L Final   Glucose, Bld 12/16/2023 430 (H)  70 - 99 mg/dL Final   Glucose reference range applies only to samples taken after fasting for at least 8 hours.   BUN 12/16/2023 22 (H)  6 - 20 mg/dL Final   Creatinine, Ser 12/16/2023 1.27 (H)  0.61 - 1.24 mg/dL Final   Calcium 44/11/270 9.6  8.9 - 10.3 mg/dL Final   Total Protein 53/66/4403 7.5  6.5 - 8.1 g/dL Final   Albumin 47/42/5956 4.5  3.5 - 5.0 g/dL Final   AST 38/75/6433 28  15 - 41 U/L Final   HEMOLYSIS AT THIS LEVEL MAY AFFECT RESULT   ALT 12/16/2023 41  0 - 44 U/L Final   HEMOLYSIS AT THIS LEVEL MAY AFFECT RESULT   Alkaline Phosphatase 12/16/2023 94  38 - 126 U/L Final   Total Bilirubin 12/16/2023 1.4 (H)  0.0 - 1.2 mg/dL Final   HEMOLYSIS AT THIS LEVEL MAY AFFECT RESULT   GFR, Estimated 12/16/2023 >60  >60 mL/min Final   Comment: (NOTE) Calculated using the CKD-EPI Creatinine Equation (2021)    Anion gap 12/16/2023 19 (H)  5 - 15 Final   Performed at [facility], [address] [place], [place] [address]   Glucose-Capillary 12/17/2023 332 (H)  70 - 99 mg/dL Final   Glucose reference range applies only to samples taken after fasting for at least 8 hours.   Beta-Hydroxybutyric Acid 12/17/2023 6.87 (H)  0.05 - 0.27 mmol/L Final   Comment: RESULT CONFIRMED BY MANUAL DILUTION Performed at [facility], [address] [place], [place] [address]    pH, Ven 12/17/2023 7.321  7.25 - 7.43 Final  pCO2, Ven 12/17/2023 36.0 (L)  44 - 60 mmHg Final   pO2, Ven 12/17/2023 101 (H)  32 - 45 mmHg Final   Bicarbonate 12/17/2023 18.6 (L)  20.0 - 28.0 mmol/L Final   TCO2 12/17/2023 20 (L)  22 - 32 mmol/L Final   O2 Saturation 12/17/2023 97  % Final   Acid-base deficit 12/17/2023 7.0 (H)  0.0 - 2.0 mmol/L Final   Sodium 12/17/2023 131 (L)  135 - 145  mmol/L Final   Potassium 12/17/2023 4.4  3.5 - 5.1 mmol/L Final   Calcium, Ion 12/17/2023 1.14 (L)  1.15 - 1.40 mmol/L Final   HCT 12/17/2023 48.0  39.0 - 52.0 % Final   Hemoglobin 12/17/2023 16.3  13.0 - 17.0 g/dL Final   Sample type 86/57/8469 VENOUS   Final   Glucose-Capillary 12/17/2023 257 (H)  70 - 99 mg/dL Final   Glucose reference range applies only to samples taken after fasting for at least 8 hours.   Glucose-Capillary 12/17/2023 162 (H)  70 - 99 mg/dL Final   Glucose reference range applies only to samples taken after fasting for at least 8 hours.   Sodium 12/17/2023 132 (L)  135 - 145 mmol/L Final   Potassium 12/17/2023 4.3  3.5 - 5.1 mmol/L Final   Chloride 12/17/2023 99  98 - 111 mmol/L Final   CO2 12/17/2023 21 (L)  22 - 32 mmol/L Final   Glucose, Bld 12/17/2023 154 (H)  70 - 99 mg/dL Final   Glucose reference range applies only to samples taken after fasting for at least 8 hours.   BUN 12/17/2023 16  6 - 20 mg/dL Final   Creatinine, Ser 12/17/2023 1.00  0.61 - 1.24 mg/dL Final   Calcium 62/95/2841 9.1  8.9 - 10.3 mg/dL Final   GFR, Estimated 12/17/2023 >60  >60 mL/min Final   Comment: (NOTE) Calculated using the CKD-EPI Creatinine Equation (2021)    Anion gap 12/17/2023 12  5 - 15 Final   Performed at [facility], [address] [place], [place] [address]   Sodium 12/17/2023 133 (L)  135 - 145 mmol/L Final   Potassium 12/17/2023 4.0  3.5 - 5.1 mmol/L Final   Chloride 12/17/2023 101  98 - 111 mmol/L Final   CO2 12/17/2023 24  22 - 32 mmol/L Final   Glucose, Bld 12/17/2023 144 (H)  70 - 99 mg/dL Final   Glucose reference range applies only to samples taken after fasting for at least 8 hours.   BUN 12/17/2023 14  6 - 20 mg/dL Final   Creatinine, Ser 12/17/2023 0.84  0.61 - 1.24 mg/dL Final   Calcium 08/28/2535 9.0  8.9 - 10.3 mg/dL Final   GFR, Estimated 12/17/2023 >60  >60 mL/min Final   Comment: (NOTE) Calculated using the CKD-EPI Creatinine Equation  (2021)    Anion gap 12/17/2023 8  5 - 15 Final   Performed at [facility], [address], [place], [place] [address]   Sodium 12/17/2023 128 (L)  135 - 145 mmol/L Final   Potassium 12/17/2023 4.5  3.5 - 5.1 mmol/L Final   HEMOLYSIS AT THIS LEVEL MAY AFFECT RESULT   Chloride 12/17/2023 97 (L)  98 - 111 mmol/L Final   CO2 12/17/2023 25  22 - 32 mmol/L Final   Glucose, Bld 12/17/2023 280 (H)  70 - 99 mg/dL Final   Glucose reference range applies only to samples taken after fasting for at least 8 hours.   BUN  12/17/2023 14  6 - 20 mg/dL Final   Creatinine, Ser 12/17/2023 0.88  0.61 - 1.24 mg/dL Final   Calcium 40/98/1191 8.7 (L)  8.9 - 10.3 mg/dL Final   GFR, Estimated 12/17/2023 >60  >60 mL/min Final   Comment: (NOTE) Calculated using the CKD-EPI Creatinine Equation (2021)    Anion gap 12/17/2023 6  5 - 15 Final   Performed at [facility], [address] [place], [place] [address]   Lipase 12/17/2023 25  11 - 51 U/L Final   Performed at [facility], [address] [place], [place] [address]   Glucose-Capillary 12/17/2023 182 (H)  70 - 99 mg/dL Final   Glucose reference range applies only to samples taken after fasting for at least 8 hours.   Glucose-Capillary 12/17/2023 123 (H)  70 - 99 mg/dL Final   Glucose reference range applies only to samples taken after fasting for at least 8 hours.   Glucose-Capillary 12/17/2023 140 (H)  70 - 99 mg/dL Final   Glucose reference range applies only to samples taken after fasting for at least 8 hours.   Glucose-Capillary 12/17/2023 141 (H)  70 - 99 mg/dL Final   Glucose reference range applies only to samples taken after fasting for at least 8 hours.   Glucose-Capillary 12/17/2023 274 (H)  70 - 99 mg/dL Final   Glucose reference range applies only to samples taken after fasting for at least 8 hours.   Glucose-Capillary 12/17/2023 290 (H)  70 - 99 mg/dL Final   Glucose reference range applies only to samples taken after fasting  for at least 8 hours.   Glucose-Capillary 12/17/2023 300 (H)  70 - 99 mg/dL Final   Glucose reference range applies only to samples taken after fasting for at least 8 hours.   Glucose-Capillary 12/17/2023 292 (H)  70 - 99 mg/dL Final   Glucose reference range applies only to samples taken after fasting for at least 8 hours.   Glucose-Capillary 12/17/2023 227 (H)  70 - 99 mg/dL Final   Glucose reference range applies only to samples taken after fasting for at least 8 hours.  Admission on 12/12/2023, Discharged on 12/12/2023  Component Date Value Ref Range Status   Sodium 12/12/2023 136  135 - 145 mmol/L Final   Potassium 12/12/2023 4.1  3.5 - 5.1 mmol/L Final   Chloride 12/12/2023 100  98 - 111 mmol/L Final   CO2 12/12/2023 25  22 - 32 mmol/L Final   Glucose, Bld 12/12/2023 267 (H)  70 - 99 mg/dL Final   Glucose reference range applies only to samples taken after fasting for at least 8 hours.   BUN 12/12/2023 9  6 - 20 mg/dL Final   Creatinine, Ser 12/12/2023 0.83  0.61 - 1.24 mg/dL Final   Calcium 08/65/7846 8.9  8.9 - 10.3 mg/dL Final   GFR, Estimated 12/12/2023 >60  >60 mL/min Final   Comment: (NOTE) Calculated using the CKD-EPI Creatinine Equation (2021)    Anion gap 12/12/2023 11  5 - 15 Final   Performed at [facility], [address], [place], [place] [address]   WBC 12/12/2023 6.5  4.0 - 10.5 K/uL Final   RBC 12/12/2023 4.52  4.22 - 5.81 MIL/uL Final   Hemoglobin 12/12/2023 13.2  13.0 - 17.0 g/dL Final   HCT 28/41/3244 40.8  39.0 - 52.0 % Final   MCV 12/12/2023 90.3  80.0 - 100.0 fL Final   MCH 12/12/2023 29.2  26.0 -  34.0 pg Final   MCHC 12/12/2023 32.4  30.0 - 36.0 g/dL Final   RDW 91/47/8295 12.4  11.5 - 15.5 % Final   Platelets 12/12/2023 384  150 - 400 K/uL Final   nRBC 12/12/2023 0.0  0.0 - 0.2 % Final   Neutrophils Relative % 12/12/2023 52  % Final   Neutro Abs 12/12/2023 3.4  1.7 - 7.7 K/uL Final   Lymphocytes Relative 12/12/2023 36  % Final   Lymphs Abs  12/12/2023 2.4  0.7 - 4.0 K/uL Final   Monocytes Relative 12/12/2023 9  % Final   Monocytes Absolute 12/12/2023 0.6  0.1 - 1.0 K/uL Final   Eosinophils Relative 12/12/2023 2  % Final   Eosinophils Absolute 12/12/2023 0.1  0.0 - 0.5 K/uL Final   Basophils Relative 12/12/2023 1  % Final   Basophils Absolute 12/12/2023 0.0  0.0 - 0.1 K/uL Final   Immature Granulocytes 12/12/2023 0  % Final   Abs Immature Granulocytes 12/12/2023 0.02  0.00 - 0.07 K/uL Final   Performed at [facility], [address] [place], [place] [address]   Alcohol, Ethyl (B) 12/12/2023 <10  <10 mg/dL Final   Comment: (NOTE) Lowest detectable limit for serum alcohol is 10 mg/dL.  For medical purposes only. Performed at [facility], [address], [place], [place] [address]   Admission on 11/30/2023, Discharged on 12/01/2023  Component Date Value Ref Range Status   Color, Urine 11/30/2023 STRAW (A)  YELLOW Final   APPearance 11/30/2023 CLEAR  CLEAR Final   Specific Gravity, Urine 11/30/2023 1.025  1.005 - 1.030 Final   pH 11/30/2023 5.0  5.0 - 8.0 Final   Glucose, UA 11/30/2023 >=500 (A)  NEGATIVE mg/dL Final   Hgb urine dipstick 11/30/2023 SMALL (A)  NEGATIVE Final   Bilirubin Urine 11/30/2023 NEGATIVE  NEGATIVE Final   Ketones, ur 11/30/2023 80 (A)  NEGATIVE mg/dL Final   Protein, ur 46/96/2952 30 (A)  NEGATIVE mg/dL Final   Nitrite 84/13/2440 NEGATIVE  NEGATIVE Final   Leukocytes,Ua 11/30/2023 NEGATIVE  NEGATIVE Final   RBC / HPF 11/30/2023 0-5  0 - 5 RBC/hpf Final   WBC, UA 11/30/2023 0-5  0 - 5 WBC/hpf Final   Bacteria, UA 11/30/2023 NONE SEEN  NONE SEEN Final   Squamous Epithelial / HPF 11/30/2023 0-5  0 - 5 /HPF Final   Mucus 11/30/2023 PRESENT   Final   Performed at [facility], [address] [place], [place] [address]   WBC 11/30/2023 8.3  4.0 - 10.5 K/uL Final   RBC 11/30/2023 5.64  4.22 - 5.81 MIL/uL Final   Hemoglobin 11/30/2023 16.7  13.0 - 17.0 g/dL Final   HCT 53/66/4403  49.5  39.0 - 52.0 % Final   MCV 11/30/2023 87.8  80.0 - 100.0 fL Final   MCH 11/30/2023 29.6  26.0 - 34.0 pg Final   MCHC 11/30/2023 33.7  30.0 - 36.0 g/dL Final   RDW 47/42/5956 11.9  11.5 - 15.5 % Final   Platelets 11/30/2023 405 (H)  150 - 400 K/uL Final   nRBC 11/30/2023 0.0  0.0 - 0.2 % Final   Neutrophils Relative % 11/30/2023 78  % Final   Neutro Abs 11/30/2023 6.5  1.7 - 7.7 K/uL Final   Lymphocytes Relative 11/30/2023 14  % Final   Lymphs Abs 11/30/2023 1.2  0.7 - 4.0 K/uL Final   Monocytes Relative 11/30/2023 7  % Final   Monocytes Absolute 11/30/2023 0.6  0.1 - 1.0 K/uL Final   Eosinophils Relative 11/30/2023 0  % Final   Eosinophils Absolute 11/30/2023 0.0  0.0 - 0.5 K/uL Final   Basophils Relative 11/30/2023 1  % Final   Basophils Absolute 11/30/2023 0.1  0.0 - 0.1 K/uL Final   Immature Granulocytes 11/30/2023 0  % Final   Abs Immature Granulocytes 11/30/2023 0.03  0.00 - 0.07 K/uL Final   Performed at [facility], [address], [place], [place] [address]   Glucose-Capillary 11/30/2023 599 (HH)  70 - 99 mg/dL Final   Glucose reference range applies only to samples taken after fasting for at least 8 hours.   Comment 1 11/30/2023 Document in Chart   Final   SARS Coronavirus 2 by RT PCR 11/30/2023 NEGATIVE  NEGATIVE Final   Influenza A by PCR 11/30/2023 POSITIVE (A)  NEGATIVE Final   Influenza B by PCR 11/30/2023 NEGATIVE  NEGATIVE Final   Comment: (NOTE) The Xpert Xpress SARS-CoV-2/FLU/RSV plus assay is intended as an aid in the diagnosis of influenza from Nasopharyngeal swab specimens and should not be used as a sole basis for treatment. Nasal washings and aspirates are unacceptable for Xpert Xpress SARS-CoV-2/FLU/RSV testing.  Fact Sheet for Patients: BloggerCourse.com  Fact Sheet for Healthcare Providers: SeriousBroker.it  This test is not yet approved or cleared by the [place] FDA and has been  authorized for detection and/or diagnosis of SARS-CoV-2 by FDA under an Emergency Use Authorization (EUA). This EUA will remain in effect (meaning this test can be used) for the duration of the COVID-19 declaration under Section 564(b)(1) of the Act, 21 U.S.C. section 360bbb-3(b)(1), unless the authorization is terminated or revoked.     Resp Syncytial Virus by PCR 11/30/2023 NEGATIVE  NEGATIVE Final   Comment: (NOTE) Fact Sheet for Patients: BloggerCourse.com  Fact Sheet for Healthcare Providers: SeriousBroker.it  This test is not yet approved or cleared by the [place] FDA and has been authorized for detection and/or diagnosis of SARS-CoV-2 by FDA under an Emergency Use Authorization (EUA). This EUA will remain in effect (meaning this test can be used) for the duration of the COVID-19 declaration under Section 564(b)(1) of the Act, 21 U.S.C. section 360bbb-3(b)(1), unless the authorization is terminated or revoked.  Performed at [facility], [address], [place], [place] [address]    Beta-Hydroxybutyric Acid 11/30/2023 >8.00 (H)  0.05 - 0.27 mmol/L Final   Comment: RESULT CONFIRMED BY MANUAL DILUTION Performed at [facility], [address], [place], [place] [address]    pH, Ven 11/30/2023 7.182 (LL)  7.25 - 7.43 Final   pCO2, Ven 11/30/2023 34.0 (L)  44 - 60 mmHg Final   pO2, Ven 11/30/2023 48 (H)  32 - 45 mmHg Final   Bicarbonate 11/30/2023 12.7 (L)  20.0 - 28.0 mmol/L Final   TCO2 11/30/2023 14 (L)  22 - 32 mmol/L Final   O2 Saturation 11/30/2023 74  % Final   Acid-base deficit 11/30/2023 14.0 (H)  0.0 - 2.0 mmol/L Final   Sodium 11/30/2023 129 (L)  135 - 145 mmol/L Final   Potassium 11/30/2023 5.6 (H)  3.5 - 5.1 mmol/L Final   Calcium, Ion 11/30/2023 1.26  1.15 - 1.40 mmol/L Final   HCT 11/30/2023 53.0 (H)  39.0 - 52.0 % Final   Hemoglobin 11/30/2023 18.0 (H)  13.0 - 17.0 g/dL Final   Sample type  86/57/8469 VENOUS   Final   Comment 11/30/2023 NOTIFIED PHYSICIAN   Final  Glucose-Capillary 11/30/2023 549 (HH)  70 - 99 mg/dL Final   Glucose reference range applies only to samples taken after fasting for at least 8 hours.   Comment 1 11/30/2023 Notify RN   Final   Comment 2 11/30/2023 Document in Chart   Final   Sodium 11/30/2023 135  135 - 145 mmol/L Final   Potassium 11/30/2023 4.9  3.5 - 5.1 mmol/L Final   Chloride 11/30/2023 108  98 - 111 mmol/L Final   BUN 11/30/2023 20  6 - 20 mg/dL Final   Creatinine, Ser 11/30/2023 0.90  0.61 - 1.24 mg/dL Final   Glucose, Bld 16/08/9603 264 (H)  70 - 99 mg/dL Final   Glucose reference range applies only to samples taken after fasting for at least 8 hours.   Calcium, Ion 11/30/2023 1.30  1.15 - 1.40 mmol/L Final   TCO2 11/30/2023 14 (L)  22 - 32 mmol/L Final   Hemoglobin 11/30/2023 16.0  13.0 - 17.0 g/dL Final   HCT 54/07/8118 47.0  39.0 - 52.0 % Final   Glucose-Capillary 11/30/2023 281 (H)  70 - 99 mg/dL Final   Glucose reference range applies only to samples taken after fasting for at least 8 hours.   Opiates 11/30/2023 NONE DETECTED  NONE DETECTED Final   Cocaine 11/30/2023 NONE DETECTED  NONE DETECTED Final   Benzodiazepines 11/30/2023 NONE DETECTED  NONE DETECTED Final   Amphetamines 11/30/2023 NONE DETECTED  NONE DETECTED Final   Tetrahydrocannabinol 11/30/2023 POSITIVE (A)  NONE DETECTED Final   Barbiturates 11/30/2023 NONE DETECTED  NONE DETECTED Final   Comment: (NOTE) DRUG SCREEN FOR MEDICAL PURPOSES ONLY.  IF CONFIRMATION IS NEEDED FOR ANY PURPOSE, NOTIFY LAB WITHIN 5 DAYS.  LOWEST DETECTABLE LIMITS FOR URINE DRUG SCREEN Drug Class                     Cutoff (ng/mL) Amphetamine and metabolites    1000 Barbiturate and metabolites    200 Benzodiazepine                 200 Opiates and metabolites        300 Cocaine and metabolites        300 THC                            50 Performed at [facility], [address], [place], [place] [address]    Troponin I (High Sensitivity) 11/30/2023 5  <18 ng/L Final   Comment: (NOTE) Elevated high sensitivity troponin I (hsTnI) values and significant  changes across serial measurements may suggest ACS but many other  chronic and acute conditions are known to elevate hsTnI results.  Refer to the "Links" section for chest pain algorithms and additional  guidance. Performed at [facility], [address], [place], [place] [address]    Glucose-Capillary 11/30/2023 277 (H)  70 - 99 mg/dL Final   Glucose reference range applies only to samples taken after fasting for at least 8 hours.   Comment 1 11/30/2023 Notify RN   Final   Comment 2 11/30/2023 Document in Chart   Final   Sodium 11/30/2023 135  135 - 145 mmol/L Final   Potassium 11/30/2023 4.1  3.5 - 5.1 mmol/L Final   Chloride 11/30/2023 101  98 - 111 mmol/L Final   CO2 11/30/2023 20 (L)  22 - 32 mmol/L Final   Glucose, Bld 11/30/2023 180 (H)  70 - 99 mg/dL Final   Glucose reference range applies only to samples taken after fasting for at least 8 hours.   BUN 11/30/2023 14  6 - 20 mg/dL Final   Creatinine, Ser 11/30/2023 1.04  0.61 - 1.24 mg/dL Final   Calcium 16/08/9603 9.5  8.9 - 10.3 mg/dL Final   GFR, Estimated 11/30/2023 >60  >60 mL/min Final   Comment: (NOTE) Calculated using the CKD-EPI Creatinine Equation (2021)    Anion gap 11/30/2023 14  5 - 15 Final   Performed at [facility], [address], [place], [place] [address]   Sodium 12/01/2023 137  135 - 145 mmol/L Final   Potassium 12/01/2023 3.6  3.5 - 5.1 mmol/L Final   Chloride 12/01/2023 103  98 - 111 mmol/L Final   CO2 12/01/2023 23  22 - 32 mmol/L Final   Glucose, Bld 12/01/2023 162 (H)  70 - 99 mg/dL Final   Glucose reference range applies only to samples taken after fasting for at least 8 hours.   BUN 12/01/2023 13  6 - 20 mg/dL Final   Creatinine, Ser 12/01/2023 0.95  0.61 - 1.24 mg/dL Final   Calcium 11/91/4782 9.5  8.9 -  10.3 mg/dL Final   GFR, Estimated 12/01/2023 >60  >60 mL/min Final   Comment: (NOTE) Calculated using the CKD-EPI Creatinine Equation (2021)    Anion gap 12/01/2023 11  5 - 15 Final   Performed at [facility], [address] [place], [place] [address]   Sodium 11/30/2023 136  135 - 145 mmol/L Final   Potassium 11/30/2023 4.8  3.5 - 5.1 mmol/L Final   Chloride 11/30/2023 101  98 - 111 mmol/L Final   CO2 11/30/2023 13 (L)  22 - 32 mmol/L Final   Glucose, Bld 11/30/2023 226 (H)  70 - 99 mg/dL Final   Glucose reference range applies only to samples taken after fasting for at least 8 hours.   BUN 11/30/2023 18  6 - 20 mg/dL Final   Creatinine, Ser 11/30/2023 1.34 (H)  0.61 - 1.24 mg/dL Final   Calcium 30/86/5784 10.1  8.9 - 10.3 mg/dL Final   Total Protein 69/62/9528 7.4  6.5 - 8.1 g/dL Final   Albumin 41/32/4401 4.4  3.5 - 5.0 g/dL Final   AST 02/72/5366 21  15 - 41 U/L Final   ALT 11/30/2023 46 (H)  0 - 44 U/L Final   Alkaline Phosphatase 11/30/2023 84  38 - 126 U/L Final   Total Bilirubin 11/30/2023 1.9 (H)  0.0 - 1.2 mg/dL Final   GFR, Estimated 11/30/2023 >60  >60 mL/min Final   Comment: (NOTE) Calculated using the CKD-EPI Creatinine Equation (2021)    Anion gap 11/30/2023 22 (H)  5 - 15 Final   Comment: ELECTROLYTES REPEATED TO VERIFY Performed at [facility], [address] [place], [place] [address]    Lipase 11/30/2023 18  11 - 51 U/L Final   Performed at [facility], [address], [place], [place] [address]   WBC 11/30/2023 12.8 (H)  4.0 - 10.5 K/uL Final   RBC 11/30/2023 5.24  4.22 - 5.81 MIL/uL Final   Hemoglobin 11/30/2023 15.6  13.0 - 17.0 g/dL Final   HCT 56/38/7564 45.6  39.0 - 52.0 % Final   MCV 11/30/2023 87.0  80.0 - 100.0 fL Final   MCH 11/30/2023 29.8  26.0 - 34.0 pg Final   MCHC 11/30/2023 34.2  30.0 - 36.0 g/dL  Final   RDW 11/30/2023 11.9  11.5 - 15.5 % Final   Platelets 11/30/2023 361  150 - 400 K/uL Final   nRBC 11/30/2023 0.0  0.0  - 0.2 % Final   Performed at [facility], [address] [place], [place] [address]   Glucose-Capillary 11/30/2023 207 (H)  70 - 99 mg/dL Final   Glucose reference range applies only to samples taken after fasting for at least 8 hours.   aPTT 12/01/2023 23 (L)  24 - 36 seconds Final   Performed at [facility], [address], [place], [place] [address]   Prothrombin Time 12/01/2023 13.1  11.4 - 15.2 seconds Final   INR 12/01/2023 1.0  0.8 - 1.2 Final   Comment: (NOTE) INR goal varies based on device and disease states. Performed at [facility], [address] [place], [place] [address]    WBC 12/01/2023 7.8  4.0 - 10.5 K/uL Final   RBC 12/01/2023 4.80  4.22 - 5.81 MIL/uL Final   Hemoglobin 12/01/2023 14.2  13.0 - 17.0 g/dL Final   HCT 21/30/8657 41.2  39.0 - 52.0 % Final   MCV 12/01/2023 85.8  80.0 - 100.0 fL Final   MCH 12/01/2023 29.6  26.0 - 34.0 pg Final   MCHC 12/01/2023 34.5  30.0 - 36.0 g/dL Final   RDW 84/69/6295 12.0  11.5 - 15.5 % Final   Platelets 12/01/2023 323  150 - 400 K/uL Final   nRBC 12/01/2023 0.0  0.0 - 0.2 % Final   Performed at [facility], [address], [place], [place] [address]   Glucose-Capillary 11/30/2023 171 (H)  70 - 99 mg/dL Final   Glucose reference range applies only to samples taken after fasting for at least 8 hours.   Glucose-Capillary 11/30/2023 147 (H)  70 - 99 mg/dL Final   Glucose reference range applies only to samples taken after fasting for at least 8 hours.   Glucose-Capillary 11/30/2023 177 (H)  70 - 99 mg/dL Final   Glucose reference range applies only to samples taken after fasting for at least 8 hours.   Comment 1 11/30/2023 Notify RN   Final   Comment 2 11/30/2023 Document in Chart   Final   Glucose-Capillary 11/30/2023 166 (H)  70 - 99 mg/dL Final   Glucose reference range applies only to samples taken after fasting for at least 8 hours.   Glucose-Capillary 11/30/2023 161 (H)  70 - 99 mg/dL Final    Glucose reference range applies only to samples taken after fasting for at least 8 hours.   Glucose-Capillary 11/30/2023 171 (H)  70 - 99 mg/dL Final   Glucose reference range applies only to samples taken after fasting for at least 8 hours.   Glucose-Capillary 12/01/2023 148 (H)  70 - 99 mg/dL Final   Glucose reference range applies only to samples taken after fasting for at least 8 hours.   Glucose-Capillary 12/01/2023 184 (H)  70 - 99 mg/dL Final   Glucose reference range applies only to samples taken after fasting for at least 8 hours.   Glucose-Capillary 12/01/2023 196 (H)  70 - 99 mg/dL Final   Glucose reference range applies only to samples taken after fasting for at least 8 hours.   Sodium 12/01/2023 137  135 - 145 mmol/L Final   Potassium 12/01/2023 3.8  3.5 - 5.1 mmol/L Final   Chloride 12/01/2023 103  98 - 111 mmol/L Final   CO2 12/01/2023  20 (L)  22 - 32 mmol/L Final   Glucose, Bld 12/01/2023 172 (H)  70 - 99 mg/dL Final   Glucose reference range applies only to samples taken after fasting for at least 8 hours.   BUN 12/01/2023 13  6 - 20 mg/dL Final   Creatinine, Ser 12/01/2023 0.88  0.61 - 1.24 mg/dL Final   Calcium 16/08/9603 9.3  8.9 - 10.3 mg/dL Final   GFR, Estimated 12/01/2023 >60  >60 mL/min Final   Comment: (NOTE) Calculated using the CKD-EPI Creatinine Equation (2021)    Anion gap 12/01/2023 14  5 - 15 Final   Performed at [facility], [address] [place], [place] [address]   Glucose-Capillary 12/01/2023 165 (H)  70 - 99 mg/dL Final   Glucose reference range applies only to samples taken after fasting for at least 8 hours.   Glucose-Capillary 12/01/2023 157 (H)  70 - 99 mg/dL Final   Glucose reference range applies only to samples taken after fasting for at least 8 hours.  There may be more visits with results that are not included.    Allergies: Patient has no known allergies.  Medications:  PTA Medications  Medication Sig   Lancet Device MISC 1  each by Does not apply route 3 (three) times daily. May dispense any manufacturer covered by patient's insurance.   Insulin Pen Needle (BD PEN NEEDLE NANO 2ND GEN) 32G X 4 MM MISC Use to inject insulin up to 4 times a day   Continuous Glucose Sensor (DEXCOM G7 SENSOR) MISC Place new sensor every 10 days. Use to continuously monitor blood sugar.   glucose blood (ACCU-CHEK GUIDE TEST) test strip Use up to 3 strips a day when not wearing Continuous glucose monitor   insulin lispro (HUMALOG) 100 UNIT/ML KwikPen Inject 10 Units into the skin 3 (three) times daily before meals.   Continuous Glucose Receiver (DEXCOM G7 RECEIVER) DEVI Use as directed to continuously monitor blood glucose.   ARIPiprazole (ABILIFY) 15 MG tablet Take 1 tablet (15 mg total) by mouth daily.   insulin glargine (LANTUS) 100 UNIT/ML injection Inject 0.24 mLs (24 Units total) into the skin daily for 90 doses.   Vitamin D, Ergocalciferol, (DRISDOL) 1.25 MG (50000 UNIT) CAPS capsule Take 1 capsule (50,000 Units total) by mouth every 7 (seven) days.      Medical Decision Making  Despite the presence of active psychosis, the patient is alert, cooperative, and able to engage in conversation. He verbalizes a desire for help and agrees to voluntary psychiatric admission for further stabilization and treatment. Given the severity of his symptoms and poor insight, inpatient psychiatric care is medically necessary to ensure safety and initiate/adjust psychotropic medications.    Recommendations  Based on my evaluation the patient does not appear to have an emergency medical condition. Patient Blood Glucose was 732, after labwork 10 units of Novolog given subcut, transported via 911 to [facility] Myriam Forehand, NP 01/28/24  11:12 AM

## 2024-01-28 NOTE — ED Notes (Signed)
 EMS called for patient transfer to [facility].

## 2024-01-29 DIAGNOSIS — F201 Disorganized schizophrenia: Secondary | ICD-10-CM | POA: Diagnosis not present

## 2024-01-29 DIAGNOSIS — F250 Schizoaffective disorder, bipolar type: Secondary | ICD-10-CM | POA: Diagnosis not present

## 2024-01-29 LAB — CBG MONITORING, ED
Glucose-Capillary: 176 mg/dL — ABNORMAL HIGH (ref 70–99)
Glucose-Capillary: 306 mg/dL — ABNORMAL HIGH (ref 70–99)
Glucose-Capillary: 164 mg/dL — ABNORMAL HIGH (ref 70–99)
Glucose-Capillary: 185 mg/dL — ABNORMAL HIGH (ref 70–99)

## 2024-01-29 MED ORDER — INSULIN ASPART 100 UNIT/ML IJ SOLN
8.0000 [IU] | Freq: Three times a day (TID) | INTRAMUSCULAR | Status: DC
Start: 2024-01-29 — End: 2024-02-01
  Administered 2024-01-29 – 2024-02-01 (×8): 8 [IU] via SUBCUTANEOUS

## 2024-01-29 MED ORDER — INSULIN ASPART 100 UNIT/ML IJ SOLN: 12.0000 [IU] | Freq: Three times a day (TID) | INTRAMUSCULAR | Status: DC

## 2024-01-29 MED ORDER — INSULIN ASPART 100 UNIT/ML IJ SOLN
0.0000 [IU] | Freq: Three times a day (TID) | INTRAMUSCULAR | Status: DC
Start: 2024-01-29 — End: 2024-02-02
  Administered 2024-01-29: 2 [IU] via SUBCUTANEOUS
  Administered 2024-01-30: 7 [IU] via SUBCUTANEOUS
  Administered 2024-01-30: 9 [IU] via SUBCUTANEOUS
  Administered 2024-01-30: 2 [IU] via SUBCUTANEOUS
  Administered 2024-01-31: 5 [IU] via SUBCUTANEOUS
  Administered 2024-01-31: 9 [IU] via SUBCUTANEOUS
  Administered 2024-01-31 – 2024-02-01 (×2): 5 [IU] via SUBCUTANEOUS
  Administered 2024-02-01: 3 [IU] via SUBCUTANEOUS
  Administered 2024-02-01: 1 [IU] via SUBCUTANEOUS

## 2024-01-29 MED ORDER — INSULIN ASPART 100 UNIT/ML IJ SOLN
0.0000 [IU] | Freq: Every day | INTRAMUSCULAR | Status: DC
  Administered 2024-01-30: 2 [IU] via SUBCUTANEOUS
  Administered 2024-02-01: 3 [IU] via SUBCUTANEOUS

## 2024-01-29 MED ORDER — INSULIN GLARGINE-YFGN 100 UNIT/ML ~~LOC~~ SOLN
24.0000 [IU] | Freq: Every day | SUBCUTANEOUS | Status: DC
  Administered 2024-01-30 – 2024-02-01 (×3): 24 [IU] via SUBCUTANEOUS
  Filled 2024-01-29 (×4): qty 0.24

## 2024-01-29 NOTE — Progress Notes (Signed)
 Patient has been denied by [facility] due to no appropriate beds available. Patient meets BH inpatient criteria per Hillery Jacks, NP. Patient has been faxed out to the following facilities:   [facility] [address] [place] [place] [address] [phone]  [facility] [address] [place] [address] [phone]  [facility] [address] [place] [address] [phone]  [facility] [address] [place], [place] [address] [phone]  [facility] Patient Placement [facility], [place] [phone]  [facility] [address] [place] [address] [phone]  [facility] EFAX [address] [place] [phone]  [facility] [address] [place] [address] [phone]  [facility] [address] [place] [address] [phone]  [facility] [address] [place] [address] [phone]  [facility] [place] [address] [phone]  [facility] [address] [place] [address] [phone]  [facility] [address] [place] [address] [phone]  [facility] [address] [place] [address] [phone]  [facility] [address], [place] [address] [phone]  [facility] [address], [place] [address] [phone]    Damita Dunnings, MSW, LCSW-A  5:16 PM 01/29/2024

## 2024-01-29 NOTE — ED Notes (Signed)
 Belongings inventory completed by this RN and Thuy NT. No valuables assessed. Belongings placed in locker #2.

## 2024-01-29 NOTE — ED Provider Notes (Addendum)
 Emergency Medicine Observation Re-evaluation Note  Jacob [name] is a 33 y.o. male, seen on rounds today.  Pt initially presented to the ED for complaints of Hyperglycemia Currently, the patient is resting.  Physical Exam  BP 122/78   Pulse 71   Temp 98 F (36.7 C)   Resp 16   Ht 5\' 9"  (1.753 m)   Wt 59.9 kg   SpO2 100%   BMI 19.50 kg/m  Physical Exam General: NAD Cardiac: regular rate Lungs: equal chest rise Psych: calm  ED Course / MDM  EKG:EKG Interpretation Date/Time:  Saturday January 28 2024 16:09:05 EDT Ventricular Rate:  77 PR Interval:  150 QRS Duration:  80 QT Interval:  378 QTC Calculation: 428 R Axis:   99  Text Interpretation: Sinus rhythm Borderline right axis deviation ST elev, probable normal early repol pattern Confirmed by [name] [phone]) on 01/28/2024 4:18:46 PM  I have reviewed the labs performed to date as well as medications administered while in observation.  Recent changes in the last 24 hours include here for hyperglycemia.  Plan  Current plan is for placement. BG remains high so have increased insulin and consult diabetic coordinator . Also changed to diabetic diet.     Jacob Grandchild, MD 01/29/24 1231    Jacob Grandchild, MD 01/29/24 [phone]

## 2024-01-29 NOTE — Consult Note (Cosign Needed Addendum)
 [facility] Psychiatric Consult Follow-up  Patient Name: .Jacob [name]  MRN: [id number]  DOB: December 20, 1990  Consult Order details:  Orders (From admission, onward)     Start     Ordered   01/28/24 2226  CONSULT TO CALL ACT TEAM       Comments: Bed hold for [facility] - [facility] reportedly wants BG controlled for 24 hours - pt is voluntary  Ordering Provider: Wynetta Fines, MD  Provider:  (Not yet assigned)  Question:  Reason for Consult?  Answer:  Psych consult   01/28/24 2226             Mode of Visit: In person    Psychiatry Consult Evaluation  Service Date: January 29, 2024 LOS:  LOS: 0 days  Chief Complaint acute psychosis  Primary Psychiatric Diagnoses  Schizophrenia 2.  Schizoaffective disorder   Assessment  Jacob [name] is a 33 y.o. male admitted from [facility] urgent care facility due to increased paranoia and medical clearance.  Presented to the EDfor 01/28/2024  3:30 PM for paranoia. He carries the psychiatric diagnoses of schizoaffective disorder, bipolar disorder, schizophrenia major depressive disorder and has a past medical history of diabetes.   His current presentation of disorganized thoughts, delusional and pressured speech is most consistent with schizoaffective disorder. He meets criteria for inpatient admission based on noncompliance with medication.  Denied that he is currently followed by therapy or psychiatry.  States he has been homeless for the past 18's months.  Reports his mother and grandmother currently with charged with conspiracy and tampering with witnesses. Please see plan below for detailed recommendations.   Jacob [name] is resting in bed observed responding to internal stimuli.  Jacob [name] noted to be on a tangent related to multiple "murder attempts" on his life; patient's paranoid tangential and disorganized throughout this assessment He is oriented x person and place; appears to be calm/cooperative, and is redirectable.  Patient is speaking in a  clear tone at moderate volume, speech is pressured thought process is irrelevant and incoherent at times. Patient denies suicidal/self-harm/homicidal ideation, patient presents acutely psychotic however, did remain calm throughout this assessment however is very animated with questions.  Diagnoses:  Active Hospital problems: Principal Problem:   Psychosis [facility])    Plan   ## Psychiatric Medication Recommendations:  Inpatient admission  ## Medical Decision Making Capacity: Not specifically addressed in this encounter  ## Further Work-up:  -- Consideration for diabetes coordinator.  CBGs out of parameter for inpatient admission at this time.  Repeat sodium for CMP.    EKG or U/A -- most recent EKG on 3/29 had QtC of 405 -- Pertinent labwork reviewed earlier this admission includes: CBC CMP.   ## Disposition:-- We recommend inpatient psychiatric hospitalization when medically cleared. Patient is under voluntary admission status at this time; please IVC if attempts to leave hospital.  ## Behavioral / Environmental: -Difficult Patient (SELECT OPTIONS FROM BELOW) or Utilize compassion and acknowledge the patient's experiences while setting clear and realistic expectations for care.    ## Safety and Observation Level:  - Based on my clinical evaluation, I estimate the patient to be at moderate risk of self harm in the current setting. - At this time, we recommend  1:1 Observation. This decision is based on my review of the chart including patient's history and current presentation, interview of the patient, mental status examination, and consideration of suicide risk including evaluating suicidal ideation, plan, intent, suicidal or self-harm behaviors, risk factors, and protective factors. This  judgment is based on our ability to directly address suicide risk, implement suicide prevention strategies, and develop a safety plan while the patient is in the clinical setting. Please contact our  team if there is a concern that risk level has changed.  CSSR Risk Category:C-SSRS RISK CATEGORY: No Risk  Suicide Risk Assessment: Patient has following modifiable risk factors for suicide: recklessness, medication noncompliance, and lack of access to outpatient mental health resources, which we are addressing by noncompliance with medication sideration for long-acting injectable. Patient has following non-modifiable or demographic risk factors for suicide: male gender and psychiatric hospitalization Patient has the following protective factors against suicide: Access to outpatient mental health care consideration for ACT team services at discharge  Thank you for this consult request. Recommendations have been communicated to the primary team.  We will recommend inpatient admission at this time.   Jacob Rack, NP       History of Present Illness  Relevant Aspects of Hospital ED Course:  Admitted on 01/28/2024 for acute psychosis, delusional paranoid ideations.    Patient Report: Patient was seen and evaluated face-to-face by this provider.  Continues to present tangential disorganized but pleasant.  Denied thoughts to harm himself or anybody else.  Does report he is originally from [place] and feels that people are after him due to an inheritance. "  My 401(k) has a lot of money and people are trying to kill me they are making attempts on my life."  Per assessment note on admission to [facility] urgent care "33 year old African male with a past psychiatric history of schizoaffective disorder, depression, and a medical history of diabetes, presenting to the emergency department voluntarily requesting to be "for housing." The patient is currently homeless and reports increased paranoia and auditory hallucinations. He states, "[employer] and [employer] are monitoring my brain activity" and "I hear the sound waves." He also reports upcoming court dates in [place] for attempted murder charges  and in [place], though details remain unclear.The patient appears restless and internally preoccupied. He expresses concerns about being overmedicated and states, "I only want to be on five pills -- I don't need a lot right now." He endorses vague suicidal ideation but denies a specific plan or intent. There is no report of homicidal ideation. He agrees to voluntary psychiatric admission for further evaluation and stabilization. The patient's thought content is delusional, his insight is limited, and his judgment is impaired.His homelessness, pending legal issues, unmanaged diabetes, and lack of consistent psychiatric care appear to be contributing to his current decompensation."  Psych ROS:  Depression: Denied Anxiety: Denied Mania (lifetime and current): Current Psychosis: (lifetime and current): Delusional, tangential and disorganized  Collateral information:  See chart  Review of Systems  Psychiatric/Behavioral:  Positive for hallucinations. Negative for suicidal ideas. The patient is nervous/anxious. The patient does not have insomnia.   All other systems reviewed and are negative.    Psychiatric and Social History  Psychiatric History:  Information collected from patient who is a poor historian  Prev Dx/Sx: Documented history related to schizoaffective disorder Current Psych Provider: Patient was restarted on Abilify Home Meds (current): Poor historian related to medication management Previous Med Trials:  Therapy:    Social History:  Developmental Hx: Unable to participate in assess delusional,tangential thought process Educational Hx: N/A Occupational Hx: N/A Legal Hx: N/A Living Situation: N/A Spiritual Hx: N/A Access to weapons/lethal means: Unknown no documented history related to suicidal or self injures behaviors.  Substance History Alcohol: Less than  10 Type of alcohol  Last Drink  Number of drinks per day  History of alcohol withdrawal seizures  History of  DT's  Tobacco:  Illicit drugs: Urine drug screen positive for marijuana Prescription drug abuse:  Rehab hx:   Exam Findings  Physical Exam:  Vital Signs:  Temp:  [97.9 F (36.6 C)-98.7 F (37.1 C)] 98 F (36.7 C) (03/30 0800) Pulse Rate:  [66-84] 71 (03/30 0800) Resp:  [11-19] 16 (03/30 0800) BP: (114-140)/(78-96) 122/78 (03/30 0800) SpO2:  [98 %-100 %] 100 % (03/30 0800) Weight:  [59.9 kg] 59.9 kg (03/29 1738) Blood pressure 122/78, pulse 71, temperature 98 F (36.7 C), resp. rate 16, height 5\' 9"  (1.753 m), weight 59.9 kg, SpO2 100%. Body mass index is 19.5 kg/m.  Physical Exam  Mental Status Exam: General Appearance: Disheveled  Orientation:  Other:  Person and place  Memory:  Immediate;   Fair  Concentration:  Concentration: Poor  Recall:  Poor  Attention  Poor  Eye Contact:  Good  Speech:  Clear and Coherent and Pressured  Language:  Good  Volume:   Fluctuations  Mood: Congruent  Affect:  Labile  Thought Process:  Disorganized  Thought Content:  Delusions, Paranoid Ideation, Rumination, and Tangential  Suicidal Thoughts:  No  Homicidal Thoughts:  No  Judgement:  Poor  Insight:  Lacking  Psychomotor Activity:  Normal  Akathisia:  No  Fund of Knowledge:  Fair      Assets:  Resilience Social Support  Cognition:  WNL  ADL's:  Intact  AIMS (if indicated):        Patient was restarted on Abilify 15 mg daily Pending medical clearance continue to manage blood glucose levels -Consider consult for diabetes care coordinator  Other History   These have been pulled in through the EMR, reviewed, and updated if appropriate.  Family History:  The patient's family history includes Healthy in his father and mother.  Medical History: Past Medical History:  Diagnosis Date  . Bipolar 1 disorder (HCC)   . History of attempted suicide 2019   Unsuccessful suicide attempt in 2019 with rat poison  . Schizoaffective disorder (HCC)   . Type 1 diabetes mellitus on  insulin therapy (HCC) 2019  . Vitamin D deficiency 01/03/2024    Surgical History: No past surgical history on file.   Medications:   Current Facility-Administered Medications:  .  ARIPiprazole (ABILIFY) tablet 15 mg, 15 mg, Oral, Daily, Wynetta Fines, MD, 15 mg at 01/29/24 1113 .  insulin aspart (novoLOG) injection 10 Units, 10 Units, Subcutaneous, TID AC, Wynetta Fines, MD, 10 Units at 01/29/24 0801 .  insulin glargine-yfgn (SEMGLEE) injection 30 Units, 30 Units, Subcutaneous, Daily, Wynetta Fines, MD, 30 Units at 01/29/24 1111 .  [START ON 01/30/2024] Vitamin D (Ergocalciferol) (DRISDOL) 1.25 MG (50000 UNIT) capsule 50,000 Units, 50,000 Units, Oral, Q7 days, Messick, Noralyn Pick, MD  Current Outpatient Medications:  .  ARIPiprazole (ABILIFY) 15 MG tablet, Take 1 tablet (15 mg total) by mouth daily., Disp: 30 tablet, Rfl: 11 .  insulin glargine (LANTUS) 100 UNIT/ML injection, Inject 0.24 mLs (24 Units total) into the skin daily for 90 doses., Disp: 30 mL, Rfl: 0 .  insulin lispro (HUMALOG) 100 UNIT/ML KwikPen, Inject 10 Units into the skin 3 (three) times daily before meals., Disp: 27 mL, Rfl: 0 .  Vitamin D, Ergocalciferol, (DRISDOL) 1.25 MG (50000 UNIT) CAPS capsule, Take 1 capsule (50,000 Units total) by mouth every 7 (seven) days., Disp: 4 capsule,  Rfl: 1 .  glucose blood (ACCU-CHEK GUIDE TEST) test strip, Use up to 3 strips a day when not wearing Continuous glucose monitor, Disp: 50 each, Rfl: 12 .  Insulin Pen Needle (BD PEN NEEDLE NANO 2ND GEN) 32G X 4 MM MISC, Use to inject insulin up to 4 times a day, Disp: 200 each, Rfl: 3 .  Lancet Device MISC, 1 each by Does not apply route 3 (three) times daily. May dispense any manufacturer covered by patient's insurance., Disp: 1 each, Rfl: 0  Allergies: No Known Allergies  Jacob Rack, NP

## 2024-01-29 NOTE — ED Notes (Signed)
 Pt resting at this time.

## 2024-01-29 NOTE — Inpatient Diabetes Management (Signed)
 Inpatient Diabetes Program Recommendations  AACE/ADA: New Consensus Statement on Inpatient Glycemic Control (2015)  Target Ranges:  Prepandial:   less than 140 mg/dL      Peak postprandial:   less than 180 mg/dL (1-2 hours)      Critically ill patients:  140 - 180 mg/dL   Lab Results  Component Value Date   GLUCAP 306 (H) 01/29/2024   HGBA1C 14.8 (H) 01/28/2024    Review of Glycemic Control  Latest Reference Range & Units 01/28/24 14:13 01/28/24 15:50 01/28/24 18:08 01/28/24 19:16 01/28/24 21:42 01/29/24 07:56 01/29/24 11:10  Glucose-Capillary 70 - 99 mg/dL >191 (HH) >478 (HH) 295 (H) 300 (H) 326 (H) 176 (H) 306 (H)  (HH): Data is critically high (H): Data is abnormally high  Diabetes history: DM1(does not make insulin.  Needs correction, basal and meal coverage)  Outpatient Diabetes medications: Lantus 24 units every day, Humalog 10 units TID with meals  Current orders for Inpatient glycemic control: Lantus 30 units every day, Novolog 10 units TID  Inpatient Diabetes Program Recommendations:    Please consider:  Semglee 24 units QAM Novolog 0-9 units TID and 0-5 units at bedtime  Novolog 8 units TID with meals  Will continue to follow while inpatient.  Thank you, Dulce Sellar, MSN, CDCES Diabetes Coordinator Inpatient Diabetes Program [phone] (team pager from 8a-5p)

## 2024-01-30 LAB — CBG MONITORING, ED
Glucose-Capillary: 240 mg/dL — ABNORMAL HIGH (ref 70–99)
Glucose-Capillary: 430 mg/dL — ABNORMAL HIGH (ref 70–99)
Glucose-Capillary: 493 mg/dL — ABNORMAL HIGH (ref 70–99)
Glucose-Capillary: 141 mg/dL — ABNORMAL HIGH (ref 70–99)
Glucose-Capillary: 184 mg/dL — ABNORMAL HIGH (ref 70–99)
Glucose-Capillary: 317 mg/dL — ABNORMAL HIGH (ref 70–99)
Glucose-Capillary: 212 mg/dL — ABNORMAL HIGH (ref 70–99)

## 2024-01-30 MED ORDER — RISPERIDONE 1 MG PO TABS
1.0000 mg | ORAL_TABLET | Freq: Two times a day (BID) | ORAL | Status: DC
  Administered 2024-01-30 – 2024-02-01 (×6): 1 mg via ORAL
  Filled 2024-01-30 (×6): qty 1

## 2024-01-30 NOTE — Progress Notes (Addendum)
 LCSW Progress Note   [id number]  Jacob [name] 01/30/2024 8:24 PM   Patient was recommended inpatient per Eligha Bridegroom NP.There are no available beds at [facility], per [facility] Rona Ravens RN. Patient was re-faxed to the following out of network facilities:     Service Provider   Address Phone Fax   [facility]   [address] [place] [place] [address] [phone]   [facility]   [address] [place] [address] [phone]   [facility]   [address], [place] [address] [phone]   [facility]   [address] [place], [place] [address] [phone]   [facility] Patient Placement   [facility], [place] [phone]   [facility]   [address] [place] [address] [phone]   [facility] EFAX   [address] [place] [phone]   [facility]   [address] [place] [address] [phone]   [facility]   [address] [place] [address] [phone]   [facility] [address]   [place] [address] [phone]   [facility]   [facility] [place] [address] [phone]   [facility]   [address] [place] [address] [phone]   [facility]   [address], [place] [address] [phone]   [facility]   [address] [place] [address] [phone]   [facility]   [address] [place] [address] [phone]   [facility]   [address], [place] [address] [phone]

## 2024-01-30 NOTE — Progress Notes (Signed)
 LCSW Progress Note  [id number]   Jacob [name]  01/30/2024  2:05 PM  Description:   Inpatient Psychiatric Referral  Patient was recommended inpatient per Eligha Bridegroom NP.There are no available beds at [facility], per [facility] Rona Ravens RN. Patient was referred to the following out of network facilities:   Destination  Service Provider Address Phone Fax  [facility] [address] [place] [place] [address] [phone]  [facility] [address] [place] [address] [phone]  [facility] [address] [place] [address] [phone]  [facility] [address] [place], [place] [address] [phone]  [facility] Patient Placement [facility], [place] [phone]  [facility] [address] [place] [address] [phone]  [facility] EFAX [address], [place] [phone]  [facility] [address] [place] [address] [phone]  [facility] [address] [place] [address] [phone]  [facility] [address] [place] [address] [phone]  [facility] [place] [address] [phone]  [facility] [address] [place] [address] [phone]  [facility] [address], [place] [address] [phone]  [facility] [address] [place] [address] [phone]  [facility] [address], [place] [address] [phone]  [facility] [address] [place] [address] [phone]  [phone]   Discharge Information    Situation ongoing, CSW to continue following and update chart as more information becomes available.      [name], MSW, LCSW  01/30/2024 2:05 PM

## 2024-01-30 NOTE — Consult Note (Signed)
 [facility] Psychiatric Consult Follow-up  Patient Name: .Jacob [name]  MRN: [id number]  DOB: 06-06-1991  Consult Order details:  Orders (From admission, onward)     Start     Ordered   01/28/24 2226  CONSULT TO CALL ACT TEAM       Comments: Bed hold for [facility] - [facility] reportedly wants BG controlled for 24 hours - pt is voluntary  Ordering Provider: Wynetta Fines, MD  Provider:  (Not yet assigned)  Question:  Reason for Consult?  Answer:  Psych consult   01/28/24 2226             Mode of Visit: In person    Psychiatry Consult Evaluation  Service Date: January 30, 2024 LOS: 3 days Chief Complaint acute psychosis  Primary Psychiatric Diagnoses  Schizophrenia 2.  Schizoaffective disorder   Assessment  Jacob [name] is a 33 y.o. male admitted from [facility] urgent care facility due to increased paranoia and medical clearance.  Presented to the EDfor 01/28/2024  3:30 PM for paranoia. He carries the psychiatric diagnoses of schizoaffective disorder, bipolar disorder, schizophrenia major depressive disorder and has a past medical history of diabetes.   His current presentation of disorganized thoughts, delusional and pressured speech is most consistent with schizoaffective disorder. He meets criteria for inpatient admission based on noncompliance with medication.  Denied that he is currently followed by therapy or psychiatry.  States he has been homeless for the past 18's months.  Reports his mother and grandmother currently with charged with conspiracy and tampering with witnesses. Please see plan below for detailed recommendations.   Deran Heart is resting in bed observed responding to internal stimuli.  Guss noted to be on a tangent related to multiple "murder attempts" on his life; patient's paranoid tangential and disorganized throughout this assessment He is oriented x person and place; appears to be calm/cooperative, and is redirectable.  Patient is speaking in a clear tone  at moderate volume, speech is pressured thought process is irrelevant and incoherent at times. Patient denies suicidal/self-harm/homicidal ideation, patient presents acutely psychotic however, did remain calm throughout this assessment however is very animated with questions.  Diagnoses:  Active Hospital problems: Principal Problem:   Schizoaffective disorder, bipolar type (HCC) Active Problems:   Psychosis (HCC)    Plan   ## Psychiatric Medication Recommendations:  - Discontinue Abilify 15 mg, as it has not appeared to be affective  - Start Risperidone 1 mg BID   ## Medical Decision Making Capacity: Not specifically addressed in this encounter  ## Further Work-up:  -- Consideration for diabetes coordinator.  CBGs out of parameter for inpatient admission at this time.  Repeat sodium for CMP.    EKG or U/A -- most recent EKG on 3/29 had QtC of 405 -- Pertinent labwork reviewed earlier this admission includes: CBC CMP.   ## Disposition:-- We recommend inpatient psychiatric hospitalization when medically cleared. Patient is under voluntary admission status at this time; please IVC if attempts to leave hospital.  ## Behavioral / Environmental: -Difficult Patient (SELECT OPTIONS FROM BELOW) or Utilize compassion and acknowledge the patient's experiences while setting clear and realistic expectations for care.    ## Safety and Observation Level:  - Based on my clinical evaluation, I estimate the patient to be at moderate risk of self harm in the current setting. - At this time, we recommend  1:1 Observation. This decision is based on my review of the chart including patient's history and current presentation, interview of the  patient, mental status examination, and consideration of suicide risk including evaluating suicidal ideation, plan, intent, suicidal or self-harm behaviors, risk factors, and protective factors. This judgment is based on our ability to directly address suicide risk,  implement suicide prevention strategies, and develop a safety plan while the patient is in the clinical setting. Please contact our team if there is a concern that risk level has changed.  CSSR Risk Category:C-SSRS RISK CATEGORY: No Risk  Suicide Risk Assessment: Patient has following modifiable risk factors for suicide: recklessness, medication noncompliance, and lack of access to outpatient mental health resources, which we are addressing by noncompliance with medication sideration for long-acting injectable. Patient has following non-modifiable or demographic risk factors for suicide: male gender and psychiatric hospitalization Patient has the following protective factors against suicide: Access to outpatient mental health care consideration for ACT team services at discharge  Thank you for this consult request. Recommendations have been communicated to the primary team.  We will recommend inpatient admission at this time.   Eligha Bridegroom, NP       History of Present Illness  Relevant Aspects of Hospital ED Course:  Admitted on 01/28/2024 for acute psychosis, delusional paranoid ideations.    Patient Report: Patient was seen and evaluated face-to-face by this provider.  Continues to present tangential disorganized but pleasant.  Denied thoughts to harm himself or anybody else.  Does report he is originally from [place] and feels that people are after him due to an inheritance. "  My 401(k) has a lot of money and people are trying to kill me they are making attempts on my life."  Pt seen at [facility] for face to face psychiatric reevaluation. Pt remains pleasant, however continues to be grandiose, paranoid, and delusional. Pt continues to mention his large amounts of money, that "[place], [place], and [place]" are trying to kill him and out for his money, He stated they tried to come to his house and use Anthrax to kill him. He also believes the [place] is trying to kill him.  Pt denies SI/HI. He does deny AH/VH, however he was witnessed to be RTIS in his room. Pt noticeably talking and laughing to self.   Abilify Discontinued as it does not appear to be affective for patient. Start Risperidone 1 mg BID. Pt does have hx of medication noncompliance, could benefit for Risperidone LAI in the future. Will continue to recommend inpatient psychiatric treatment.   Psych ROS:  Depression: Denied Anxiety: Denied Mania (lifetime and current): Current Psychosis: (lifetime and current): Delusional, tangential and disorganized  Collateral information:  See chart  Review of Systems  Psychiatric/Behavioral:  Positive for hallucinations. Negative for suicidal ideas. The patient is nervous/anxious. The patient does not have insomnia.   All other systems reviewed and are negative.    Psychiatric and Social History  Psychiatric History:  Information collected from patient who is a poor historian  Prev Dx/Sx: Documented history related to schizoaffective disorder Current Psych Provider: Patient was restarted on Abilify Home Meds (current): Poor historian related to medication management Previous Med Trials:  Therapy:    Social History:  Developmental Hx: Unable to participate in assess delusional,tangential thought process Educational Hx: N/A Occupational Hx: N/A Legal Hx: N/A Living Situation: homeless Spiritual Hx: N/A Access to weapons/lethal means: Unknown no documented history related to suicidal or self injures behaviors.  Substance History Alcohol: Less than 10 Type of alcohol  Last Drink  Number of drinks per day  History of alcohol  withdrawal seizures  History of DT's  Tobacco:  Illicit drugs: Urine drug screen positive for marijuana Prescription drug abuse:  Rehab hx:   Exam Findings  Physical Exam:  Vital Signs:  Temp:  [98.5 F (36.9 C)] 98.5 F (36.9 C) (03/30 1710) Pulse Rate:  [70] 70 (03/30 1710) Resp:  [14] 14 (03/30 1710) BP:  (111)/(80) 111/80 (03/30 1710) SpO2:  [100 %] 100 % (03/30 1710) Blood pressure 111/80, pulse 70, temperature 98.5 F (36.9 C), temperature source Oral, resp. rate 14, height 5\' 9"  (1.753 m), weight 59.9 kg, SpO2 100%. Body mass index is 19.5 kg/m.  Physical Exam  Mental Status Exam: General Appearance: Disheveled  Orientation:  Other:  Person and place  Memory:  Immediate;   Fair  Concentration:  Concentration: Poor  Recall:  Poor  Attention  Poor  Eye Contact:  Good  Speech:  Clear and Coherent and Pressured  Language:  Good  Volume:   Fluctuations  Mood: Congruent  Affect:  Labile  Thought Process:  Disorganized  Thought Content:  Delusions, Paranoid Ideation, Rumination, and Tangential  Suicidal Thoughts:  No  Homicidal Thoughts:  No  Judgement:  Poor  Insight:  Lacking  Psychomotor Activity:  Normal  Akathisia:  No  Fund of Knowledge:  Fair      Assets:  Resilience Social Support  Cognition:  WNL  ADL's:  Intact  AIMS (if indicated):        Patient was restarted on Abilify 15 mg daily Pending medical clearance continue to manage blood glucose levels -Consider consult for diabetes care coordinator  Other History   These have been pulled in through the EMR, reviewed, and updated if appropriate.  Family History:  The patient's family history includes Healthy in his father and mother.  Medical History: Past Medical History:  Diagnosis Date  . Bipolar 1 disorder (HCC)   . History of attempted suicide 2019   Unsuccessful suicide attempt in 2019 with rat poison  . Schizoaffective disorder (HCC)   . Type 1 diabetes mellitus on insulin therapy (HCC) 2019  . Vitamin D deficiency 01/03/2024    Surgical History: No past surgical history on file.   Medications:   Current Facility-Administered Medications:  .  ARIPiprazole (ABILIFY) tablet 15 mg, 15 mg, Oral, Daily, Wynetta Fines, MD, 15 mg at 01/30/24 1022 .  insulin aspart (novoLOG) injection 0-5 Units,  0-5 Units, Subcutaneous, QHS, Scheving, William L, MD .  insulin aspart (novoLOG) injection 0-9 Units, 0-9 Units, Subcutaneous, TID WC, [name], MD, 2 Units at 01/30/24 1240 .  insulin aspart (novoLOG) injection 8 Units, 8 Units, Subcutaneous, TID AC, [name], MD, 8 Units at 01/30/24 1240 .  insulin glargine-yfgn (SEMGLEE) injection 24 Units, 24 Units, Subcutaneous, Daily, [name], MD, 24 Units at 01/30/24 1022 .  Vitamin D (Ergocalciferol) (DRISDOL) 1.25 MG (50000 UNIT) capsule 50,000 Units, 50,000 Units, Oral, Q7 days, Wynetta Fines, MD, 50,000 Units at 01/30/24 1022  Current Outpatient Medications:  .  ARIPiprazole (ABILIFY) 15 MG tablet, Take 1 tablet (15 mg total) by mouth daily., Disp: 30 tablet, Rfl: 11 .  insulin glargine (LANTUS) 100 UNIT/ML injection, Inject 0.24 mLs (24 Units total) into the skin daily for 90 doses., Disp: 30 mL, Rfl: 0 .  insulin lispro (HUMALOG) 100 UNIT/ML KwikPen, Inject 10 Units into the skin 3 (three) times daily before meals., Disp: 27 mL, Rfl: 0 .  Vitamin D, Ergocalciferol, (DRISDOL) 1.25 MG (50000 UNIT) CAPS capsule, Take  1 capsule (50,000 Units total) by mouth every 7 (seven) days., Disp: 4 capsule, Rfl: 1 .  glucose blood (ACCU-CHEK GUIDE TEST) test strip, Use up to 3 strips a day when not wearing Continuous glucose monitor, Disp: 50 each, Rfl: 12 .  Insulin Pen Needle (BD PEN NEEDLE NANO 2ND GEN) 32G X 4 MM MISC, Use to inject insulin up to 4 times a day, Disp: 200 each, Rfl: 3 .  Lancet Device MISC, 1 each by Does not apply route 3 (three) times daily. May dispense any manufacturer covered by patient's insurance., Disp: 1 each, Rfl: 0  Allergies: No Known Allergies  Eligha Bridegroom, NP

## 2024-01-30 NOTE — ED Notes (Addendum)
 MD notified of CBG over 400. Per MD give 9 units sliding scale with the 8 units ordered.

## 2024-01-30 NOTE — ED Notes (Signed)
 CBG now 141. Long acting insulin given with graham crackers, peanut butter, and diet ginger ale. Pt is calm and cooperative att.

## 2024-01-30 NOTE — ED Provider Notes (Signed)
 Emergency Medicine Observation Re-evaluation Note  Jacob [name] is a 33 y.o. male, seen on rounds today.  Pt initially presented to the ED for complaints of Hyperglycemia Currently, the patient is walking around his room without agitation.  Physical Exam  BP 111/80 (BP Location: Right Arm)   Pulse 70   Temp 98.5 F (36.9 C) (Oral)   Resp 14   Ht 5\' 9"  (1.753 m)   Wt 59.9 kg   SpO2 100%   BMI 19.50 kg/m  Physical Exam General: Walking around room without agitation or distress Cardiac: Not tachycardic on last vitals and no murmur on my exam Lungs: Clear bilaterally on auscultation this morning Psych: No agitation  ED Course / MDM  EKG:EKG Interpretation Date/Time:  Saturday January 28 2024 16:09:05 EDT Ventricular Rate:  77 PR Interval:  150 QRS Duration:  80 QT Interval:  378 QTC Calculation: 428 R Axis:   99  Text Interpretation: Sinus rhythm Borderline right axis deviation ST elev, probable normal early repol pattern Confirmed by [name] [phone]) on 01/28/2024 4:18:46 PM  I have reviewed the labs performed to date as well as medications administered while in observation.  Recent changes in the last 24 hours include patient had recurrent hyperglycemia his morning over 400, I spoke to pharmacy and the patient had not yet received his insulin both long-acting and the shorter acting for sliding scale and prebreakfast.  Will give him insulin and pharmacy will help confirm the correct dosing.  Plan  Current plan is for awaiting placement.    Jacob [name], Canary Brim, MD 01/30/24 [phone]

## 2024-01-31 ENCOUNTER — Telehealth: Payer: Self-pay | Admitting: *Deleted

## 2024-01-31 ENCOUNTER — Telehealth: Payer: Self-pay | Admitting: Dietician

## 2024-01-31 ENCOUNTER — Telehealth: Payer: Self-pay | Admitting: Internal Medicine

## 2024-01-31 ENCOUNTER — Encounter (HOSPITAL_COMMUNITY): Payer: Self-pay

## 2024-01-31 DIAGNOSIS — F250 Schizoaffective disorder, bipolar type: Secondary | ICD-10-CM

## 2024-01-31 LAB — CBG MONITORING, ED
Glucose-Capillary: 260 mg/dL — ABNORMAL HIGH (ref 70–99)
Glucose-Capillary: 460 mg/dL — ABNORMAL HIGH (ref 70–99)
Glucose-Capillary: 261 mg/dL — ABNORMAL HIGH (ref 70–99)
Glucose-Capillary: 138 mg/dL — ABNORMAL HIGH (ref 70–99)

## 2024-01-31 MED ORDER — TRAZODONE HCL 50 MG PO TABS
50.0000 mg | ORAL_TABLET | Freq: Every evening | ORAL | Status: DC | PRN
  Administered 2024-01-31: 50 mg via ORAL
  Filled 2024-01-31: qty 1

## 2024-01-31 NOTE — Telephone Encounter (Signed)
 Mother left message that patient is in behavioral health and may not be able to make his appointment on 4/15. She will call and let us know on the 4/14.

## 2024-01-31 NOTE — Inpatient Diabetes Management (Signed)
 Inpatient Diabetes Program Recommendations  AACE/ADA: New Consensus Statement on Inpatient Glycemic Control (2015)  Target Ranges:  Prepandial:   less than 140 mg/dL      Peak postprandial:   less than 180 mg/dL (1-2 hours)      Critically ill patients:  140 - 180 mg/dL   Lab Results  Component Value Date   GLUCAP 260 (H) 01/31/2024   HGBA1C 14.8 (H) 01/28/2024    Review of Glycemic Control  Latest Reference Range & Units 01/30/24 10:29 01/30/24 12:15 01/30/24 17:05 01/30/24 20:46 01/31/24 07:43  Glucose-Capillary 70 - 99 mg/dL 347 (H) 425 (H) 956 (H) 212 (H) 260 (H)   Diabetes history: DM1(does not make insulin.  Needs correction, basal and meal coverage) Outpatient Diabetes medications: Lantus 24 units every day, Humalog 10 units TID with meals Current orders for Inpatient glycemic control:  Semglee 24 units Daily Novolog 0-9 units tid + hs Novolog 8 unit tid meal coverage  Inpatient Diabetes Program Recommendations:    MD: Glucose trends increased after PO intake yesterday evening, Please consider:  -   Consider Increasing Novolog meal coverage to 10 units tid  Will continue to follow while inpatient.  Thanks,  Christena Deem RN, MSN, BC-ADM Inpatient Diabetes Coordinator Team Pager [phone] (8a-5p)

## 2024-01-31 NOTE — ED Notes (Signed)
Patient using the phone at this time. 

## 2024-01-31 NOTE — Telephone Encounter (Signed)
 Pt's appt is 4/14 with Dr Hessie Diener and [name].

## 2024-01-31 NOTE — Consult Note (Signed)
 [facility] Psychiatric Consult Follow-up  Patient Name: .Jacob [name]  MRN: [id number]  DOB: Jan 29, 1991  Consult Order details:  Orders (From admission, onward)     Start     Ordered   01/28/24 2226  CONSULT TO CALL ACT TEAM       Comments: Bed hold for [facility] - [facility] reportedly wants BG controlled for 24 hours - pt is voluntary  Ordering Provider: Wynetta Fines, MD  Provider:  (Not yet assigned)  Question:  Reason for Consult?  Answer:  Psych consult   01/28/24 2226             Mode of Visit: In person    Psychiatry Consult Evaluation  Service Date: January 31, 2024 LOS: 3 days Chief Complaint acute psychosis  Primary Psychiatric Diagnoses  Schizophrenia 2.  Schizoaffective disorder   Assessment  Jacob [name] is a 33 y.o. male admitted from [facility] urgent care facility due to increased paranoia and medical clearance.  Presented to the EDfor 01/28/2024  3:30 PM for paranoia. He carries the psychiatric diagnoses of schizoaffective disorder, bipolar disorder, schizophrenia major depressive disorder and has a past medical history of diabetes.   His current presentation of disorganized thoughts, delusional and pressured speech is most consistent with schizoaffective disorder. He meets criteria for inpatient admission based on noncompliance with medication.  Denied that he is currently followed by therapy or psychiatry.  States he has been homeless for the past 18's months.  Reports his mother and grandmother currently with charged with conspiracy and tampering with witnesses. Please see plan below for detailed recommendations.   Jacob [name] is resting in bed observed responding to internal stimuli.  Jacob [name] noted to be on a tangent related to multiple "murder attempts" on his life; patient's paranoid tangential and disorganized throughout this assessment He is oriented x person and place; appears to be calm/cooperative, and is redirectable.  Patient is speaking in a clear tone  at moderate volume, speech is pressured thought process is irrelevant and incoherent at times. Patient denies suicidal/self-harm/homicidal ideation, patient presents acutely psychotic however, did remain calm throughout this assessment however is very animated with questions.  Diagnoses:  Active Hospital problems: Principal Problem:   Schizoaffective disorder, bipolar type (HCC) Active Problems:   Psychosis (HCC)    Plan   ## Psychiatric Medication Recommendations:  - Discontinue Abilify 15 mg, as it has not appeared to be affective  - Start Risperidone 1 mg BID   ## Medical Decision Making Capacity: Not specifically addressed in this encounter  ## Further Work-up:  -- Consideration for diabetes coordinator.  CBGs out of parameter for inpatient admission at this time.  Repeat sodium for CMP.    EKG or U/A -- most recent EKG on 3/29 had QtC of 405 -- Pertinent labwork reviewed earlier this admission includes: CBC CMP.   ## Disposition:-- We recommend inpatient psychiatric hospitalization when medically cleared. Patient is under voluntary admission status at this time; please IVC if attempts to leave hospital.  ## Behavioral / Environmental: -Difficult Patient (SELECT OPTIONS FROM BELOW) or Utilize compassion and acknowledge the patient's experiences while setting clear and realistic expectations for care.    ## Safety and Observation Level:  - Based on my clinical evaluation, I estimate the patient to be at moderate risk of self harm in the current setting. - At this time, we recommend  1:1 Observation. This decision is based on my review of the chart including patient's history and current presentation, interview of the  patient, mental status examination, and consideration of suicide risk including evaluating suicidal ideation, plan, intent, suicidal or self-harm behaviors, risk factors, and protective factors. This judgment is based on our ability to directly address suicide risk,  implement suicide prevention strategies, and develop a safety plan while the patient is in the clinical setting. Please contact our team if there is a concern that risk level has changed.  CSSR Risk Category:C-SSRS RISK CATEGORY: No Risk  Suicide Risk Assessment: Patient has following modifiable risk factors for suicide: recklessness, medication noncompliance, and lack of access to outpatient mental health resources, which we are addressing by noncompliance with medication sideration for long-acting injectable. Patient has following non-modifiable or demographic risk factors for suicide: male gender and psychiatric hospitalization Patient has the following protective factors against suicide: Access to outpatient mental health care consideration for ACT team services at discharge  Thank you for this consult request. Recommendations have been communicated to the primary team.  We will recommend inpatient admission at this time.   Jacob Bridegroom, NP       History of Present Illness  Relevant Aspects of Hospital ED Course:  Admitted on 01/28/2024 for acute psychosis, delusional paranoid ideations.    Patient Report: Patient was seen and evaluated face-to-face by this provider.  Continues to present tangential disorganized but pleasant.  Denied thoughts to harm himself or anybody else.  Does report he is originally from [place] and feels that people are after him due to an inheritance. "  My 401(k) has a lot of money and people are trying to kill me they are making attempts on my life."  Pt seen at [facility] for face to face psychiatric reevaluation. Pt remains pleasant, however continues to be grandiose, paranoid, and delusional. Today, patient mentions a spider and spider web in his stomach that he is able to spit out acid. He feels like the banana he ate this morning upset the web in his stomach. Today, he does appear less paranoid and persecutory, and more so grandiose. Pt stated he has [employer]  workers in court with him, and his stocks are worth "gillions and trillions of dollars."  He continues to deny SI. Denies HI. Denies AVH. However, nursing staff confirmed that he continues to respond to internal stimuli while in his room alone, often laughing inappropriately and speaking to himself.   Will continue with Risperidone 1 mg BID. Will continue to recommend inpatient psychiatric treatment. Pt blood sugars have improved, and [facility] will be reviewing patient today.   Psych ROS:  Depression: Denied Anxiety: Denied Mania (lifetime and current): Current Psychosis: (lifetime and current): Delusional, tangential and disorganized  Collateral information:  See chart  Review of Systems  Psychiatric/Behavioral:  Positive for hallucinations. Negative for suicidal ideas. The patient is nervous/anxious. The patient does not have insomnia.   All other systems reviewed and are negative.    Psychiatric and Social History  Psychiatric History:  Information collected from patient who is a poor historian  Prev Dx/Sx: Documented history related to schizoaffective disorder Current Psych Provider: Patient was restarted on Abilify Home Meds (current): Poor historian related to medication management Previous Med Trials:  Therapy:    Social History:  Developmental Hx: Unable to participate in assess delusional,tangential thought process Educational Hx: N/A Occupational Hx: N/A Legal Hx: N/A Living Situation: homeless Spiritual Hx: N/A Access to weapons/lethal means: Unknown no documented history related to suicidal or self injures behaviors.  Substance History Alcohol: Less than 10 Type of alcohol  Last Drink  Number of drinks per day  History of alcohol withdrawal seizures  History of DT's  Tobacco:  Illicit drugs: Urine drug screen positive for marijuana Prescription drug abuse:  Rehab hx:   Exam Findings  Physical Exam:  Vital Signs:  Temp:  [97.7 F (36.5 C)-98.1 F (36.7 C)]  97.7 F (36.5 C) (04/01 0702) Pulse Rate:  [66-74] 66 (04/01 0702) Resp:  [16] 16 (04/01 0702) BP: (110-116)/(71-79) 110/79 (04/01 0702) SpO2:  [99 %-100 %] 99 % (04/01 0702) Blood pressure 110/79, pulse 66, temperature 97.7 F (36.5 C), temperature source Oral, resp. rate 16, height 5\' 9"  (1.753 m), weight 59.9 kg, SpO2 99%. Body mass index is 19.5 kg/m.  Physical Exam  Mental Status Exam: General Appearance: Disheveled  Orientation:  Other:  Person and place  Memory:  Immediate;   Fair  Concentration:  Concentration: Poor  Recall:  Poor  Attention  Poor  Eye Contact:  Good  Speech:  Clear and Coherent and Pressured  Language:  Good  Volume:   Fluctuations  Mood: Congruent  Affect:  Labile  Thought Process:  Disorganized  Thought Content:  Delusions, Paranoid Ideation, Rumination, and Tangential  Suicidal Thoughts:  No  Homicidal Thoughts:  No  Judgement:  Poor  Insight:  Lacking  Psychomotor Activity:  Normal  Akathisia:  No  Fund of Knowledge:  Fair      Assets:  Resilience Social Support  Cognition:  WNL  ADL's:  Intact  AIMS (if indicated):        Patient was restarted on Abilify 15 mg daily Pending medical clearance continue to manage blood glucose levels -Consider consult for diabetes care coordinator  Other History   These have been pulled in through the EMR, reviewed, and updated if appropriate.  Family History:  The patient's family history includes Healthy in his father and mother.  Medical History: Past Medical History:  Diagnosis Date  . Bipolar 1 disorder (HCC)   . History of attempted suicide 2019   Unsuccessful suicide attempt in 2019 with rat poison  . Schizoaffective disorder (HCC)   . Type 1 diabetes mellitus on insulin therapy (HCC) 2019  . Vitamin D deficiency 01/03/2024    Surgical History: No past surgical history on file.   Medications:   Current Facility-Administered Medications:  .  insulin aspart (novoLOG) injection 0-5  Units, 0-5 Units, Subcutaneous, QHS, [name], MD, 2 Units at 01/30/24 2103 .  insulin aspart (novoLOG) injection 0-9 Units, 0-9 Units, Subcutaneous, TID WC, [name], MD, 5 Units at 01/31/24 0805 .  insulin aspart (novoLOG) injection 8 Units, 8 Units, Subcutaneous, TID AC, [name], MD, 8 Units at 01/31/24 0805 .  insulin glargine-yfgn (SEMGLEE) injection 24 Units, 24 Units, Subcutaneous, Daily, [name], MD, 24 Units at 01/31/24 1012 .  risperiDONE (RISPERDAL) tablet 1 mg, 1 mg, Oral, BID, Jacob Bridegroom, NP, 1 mg at 01/31/24 0940 .  traZODone (DESYREL) tablet 50 mg, 50 mg, Oral, QHS PRN, Jacob Bridegroom, NP .  Vitamin D (Ergocalciferol) (DRISDOL) 1.25 MG (50000 UNIT) capsule 50,000 Units, 50,000 Units, Oral, Q7 days, Wynetta Fines, MD, 50,000 Units at 01/30/24 1022  Current Outpatient Medications:  .  ARIPiprazole (ABILIFY) 15 MG tablet, Take 1 tablet (15 mg total) by mouth daily., Disp: 30 tablet, Rfl: 11 .  insulin glargine (LANTUS) 100 UNIT/ML injection, Inject 0.24 mLs (24 Units total) into the skin daily for 90 doses., Disp: 30 mL, Rfl: 0 .  insulin lispro (HUMALOG) 100  UNIT/ML KwikPen, Inject 10 Units into the skin 3 (three) times daily before meals., Disp: 27 mL, Rfl: 0 .  Vitamin D, Ergocalciferol, (DRISDOL) 1.25 MG (50000 UNIT) CAPS capsule, Take 1 capsule (50,000 Units total) by mouth every 7 (seven) days., Disp: 4 capsule, Rfl: 1 .  glucose blood (ACCU-CHEK GUIDE TEST) test strip, Use up to 3 strips a day when not wearing Continuous glucose monitor, Disp: 50 each, Rfl: 12 .  Insulin Pen Needle (BD PEN NEEDLE NANO 2ND GEN) 32G X 4 MM MISC, Use to inject insulin up to 4 times a day, Disp: 200 each, Rfl: 3 .  Lancet Device MISC, 1 each by Does not apply route 3 (three) times daily. May dispense any manufacturer covered by patient's insurance., Disp: 1 each, Rfl: 0  Allergies: No Known Allergies  Jacob Bridegroom, NP

## 2024-01-31 NOTE — Progress Notes (Signed)
 LCSW Progress Note  [id number]   Jacob [name]  01/31/2024  10:31 AM  Description:   Inpatient Psychiatric Referral  Patient was recommended inpatient per Eligha Bridegroom NP. There [facility] at [facility], per [facility] Rona Ravens RN. Patient was referred to the following out of network facilities:   Destination  Service Provider Address Phone Fax  [facility] [address] [place] [place] [address] [phone]  [facility] [address] [place] [address] [phone]  [facility] [address], [place] [address] [phone]  [facility] [place] [place] [address] [place], [place] [address] [phone]  [facility] Patient Pl[facility], [place] [phone]  [facility] [address], [place] [address] [phone]  [facility] EFAX [address], [place] [phone]  [facility] [address] [place] [address] [phone]  [facility] [address] [place] [address] [phone]  [facility] [address] [place] [address] [phone]  [facility] Regino Bellow [place] [address] [phone]  [facility] [address] [place] [address] [phone]  [facility] [address] [place] [address] [phone]  [facility] [address] [place] [address] [phone]  [facility] [address], [place] [address] [phone]  [facility] [address] [place] [address] [phone]  [phone]      Situation ongoing, CSW to continue following and update chart as more information becomes available.      [place] Audrea Bolte MSW, LCSW  01/31/2024 10:31 AM

## 2024-01-31 NOTE — Telephone Encounter (Signed)
 Copied from CRM [id number]. Topic: Appointments - Appointment Info/Confirmation >> Jan 31, 2024 10:05 AM Maree Krabbe H wrote: Patients mom Nita Sells called and stated that the patient was in a behavioral center, and is not for sure when he will be getting out. They want to keep his appointment the way it is because he might still make it. She wants the doctor to know where he is. She said she does not need a call back just wanted to update everyone. >> Jan 31, 2024 11:28 AM Nurse Sherlean Foot wrote: Message t front Office staff.  Appt confirmed and has not been cancelled.  Name: Jacob [name], Jacob [name] MRN: [id number]  Date: 02/14/2024 Status: Sch  Time: 10:45 AM Length: 30  Visit Type: OPEN ESTABLISHED [726] Copay: $0.00  Provider: Faith Rogue, DO      Name: Jacob [name], Jacob [name] MRN: [id number]  Date: 02/14/2024 Status: Sch  Time: 11:15 AM Length: 30  Visit Type: OPEN ESTABLISHED [726] Copay: $0.00  Provider: Plyler, Cecil Cranker, RD

## 2024-01-31 NOTE — Telephone Encounter (Signed)
 Copied from CRM [id number]. Topic: Appointments - Appointment Info/Confirmation >> Jan 31, 2024 10:05 AM Maree Krabbe H wrote: Patients mom Nita Sells called and stated that the patient was in a behavioral center, and is not for sure when he will be getting out. They want to keep his appointment the way it is because he might still make it. She wants the doctor to know where he is. She said she does not need a call back just wanted to update everyone.

## 2024-01-31 NOTE — ED Provider Notes (Signed)
 Emergency Medicine Observation Re-evaluation Note  Jacob [name] is a 33 y.o. male, seen on rounds today.  Pt initially presented to the ED for complaints of Hyperglycemia Currently, the patient is watching TV.  Physical Exam  BP 110/79 (BP Location: Right Arm)   Pulse 66   Temp 97.7 F (36.5 C) (Oral)   Resp 16   Ht 5\' 9"  (1.753 m)   Wt 59.9 kg   SpO2 99%   BMI 19.50 kg/m  Physical Exam General: nad Cardiac: regular Lungs: clear Psych: calm and cooperative  ED Course / MDM  EKG:EKG Interpretation Date/Time:  Saturday January 28 2024 16:09:05 EDT Ventricular Rate:  77 PR Interval:  150 QRS Duration:  80 QT Interval:  378 QTC Calculation: 428 R Axis:   99  Text Interpretation: Sinus rhythm Borderline right axis deviation ST elev, probable normal early repol pattern Confirmed by [name] [phone]) on 01/28/2024 4:18:46 PM  I have reviewed the labs performed to date as well as medications administered while in observation.  Recent changes in the last 24 hours include none.  Plan  Current plan is for needs inpt and currently looking for a spot.    Gwyneth Sprout, MD 01/31/24 0830

## 2024-02-01 ENCOUNTER — Inpatient Hospital Stay (HOSPITAL_COMMUNITY): Admit: 2024-02-01 | Payer: MEDICAID

## 2024-02-01 LAB — CBG MONITORING, ED
Glucose-Capillary: 197 mg/dL — ABNORMAL HIGH (ref 70–99)
Glucose-Capillary: 293 mg/dL — ABNORMAL HIGH (ref 70–99)
Glucose-Capillary: 231 mg/dL — ABNORMAL HIGH (ref 70–99)
Glucose-Capillary: 130 mg/dL — ABNORMAL HIGH (ref 70–99)
Glucose-Capillary: 126 mg/dL — ABNORMAL HIGH (ref 70–99)
Glucose-Capillary: 294 mg/dL — ABNORMAL HIGH (ref 70–99)

## 2024-02-01 MED ORDER — INSULIN GLARGINE-YFGN 100 UNIT/ML ~~LOC~~ SOLN
30.0000 [IU] | Freq: Every day | SUBCUTANEOUS | Status: DC
  Filled 2024-02-01: qty 0.3

## 2024-02-01 MED ORDER — INSULIN ASPART 100 UNIT/ML IJ SOLN
10.0000 [IU] | Freq: Three times a day (TID) | INTRAMUSCULAR | Status: DC
  Administered 2024-02-01: 10 [IU] via SUBCUTANEOUS

## 2024-02-01 MED ORDER — INSULIN GLARGINE-YFGN 100 UNIT/ML ~~LOC~~ SOLN
6.0000 [IU] | Freq: Once | SUBCUTANEOUS | Status: AC
  Administered 2024-02-01: 6 [IU] via SUBCUTANEOUS
  Filled 2024-02-01: qty 0.06

## 2024-02-01 NOTE — Progress Notes (Signed)
 LCSW Progress Note  [id number]   Jacob [name]  02/01/2024  11:05 AM  Description:   Inpatient Psychiatric Referral  Patient was recommended inpatient per Eligha Bridegroom NP. There are [facility] available beds at [facility], per [facility]. Patient was referred to the following out of network facilities:    Destination  Service Provider Address Phone Fax  [facility] [address] [place] [place] [address] [phone]  [facility] [address] [place] [address] [phone]  [facility] [address], [place] [address] [phone]  [facility] [address] [place], [place] [address] [phone]  [facility] Patient Placement [facility], [place] [phone]  [facility] [address], [place] [address] [phone]  [facility] EFAX [address] [place] [phone]  [facility] [address] [place] [address] [phone]  [facility] [address] [place] [address] [phone]  [facility] [address] [place] [address] [phone]  [facility] Regino Bellow [place] [address] [phone]  [facility] [address] [place] [address] [phone]  [facility] [address] [place] [address] [phone]  [facility] [address] [place] [place] [address] [phone]  [facility] [address], [place] [address] [phone]  [facility] [address] [place] [address] [phone]  [phone]      Situation ongoing, CSW to continue following and update chart as more information becomes available.     [place] Yina Riviere, MSW, LCSW  02/01/2024 11:05 AM

## 2024-02-01 NOTE — ED Provider Notes (Addendum)
 Emergency Medicine Observation Re-evaluation Note  Jessi Rowe is a 33 y.o. male, seen on rounds today.  Pt initially presented to the ED for complaints of Hyperglycemia Currently, the patient is none.  Physical Exam  BP 107/84 (BP Location: Left Arm)   Pulse 68   Temp 97.8 F (36.6 C) (Oral)   Resp 18   Ht 5\' 9"  (1.753 m)   Wt 59.9 kg   SpO2 100%   BMI 19.50 kg/m  Physical Exam General: No acute distress Cardiac: Normal rate Lungs: No increased work of breathing Psych: Calm  ED Course / MDM  EKG:EKG Interpretation Date/Time:  Saturday January 28 2024 16:09:05 EDT Ventricular Rate:  77 PR Interval:  150 QRS Duration:  80 QT Interval:  378 QTC Calculation: 428 R Axis:   99  Text Interpretation: Sinus rhythm Borderline right axis deviation ST elev, probable normal early repol pattern Confirmed by [name] [phone]) on 01/28/2024 4:18:46 PM  I have reviewed the labs performed to date as well as medications administered while in observation.  Recent changes in the last 24 hours include none.  Plan  Current plan is for inpatient placement.  Patient's blood sugars have been running a little bit high.  Diabetes coordinator was consulted yesterday who recommended increasing meal coverage from 8 units to 10 units.  I made this change this morning.  Will also increase his Semglee from 24U to 30U (which it sounds like per pharmacy he was on previously)    Rolan Bucco, MD 02/01/24 1914    Rolan Bucco, MD 02/01/24 1202

## 2024-02-01 NOTE — ED Provider Notes (Signed)
 accepted to Dr. Abbott Pao at behavioral health.  Stable throughout my care.   Virgina Norfolk, DO 02/01/24 1945

## 2024-02-01 NOTE — ED Notes (Addendum)
 Joesph Fillers Intake coordinator from [facility] called concerning this patient. Per Lawrence Marseilles the patient blood sugar need to be 250 or below and consistent for 12 hours before there facility can accept this patient.  The phone number is [phone].

## 2024-02-01 NOTE — ED Notes (Signed)
 [facility] called for transfer; ETA 20 minutes out-Monique,RN

## 2024-02-01 NOTE — Progress Notes (Signed)
 Pt has been accepted to [facility] on 02/01/2024  Bed assignment: 401-1  Pt meets inpatient criteria per: Arsenio Loader NP  Attending Physician will be: Dr. Sarita Bottom, MD   Report can be called to:Adult unit: [id number]  Pt can arrive after CONSENT   Care Team Notified: [facility] Rona Ravens RN, Arsenio Loader NP, Lorella Nimrod RN, Karn Pickler RN, [facility] RN  [place] Zaia Carre LCSW-A   02/01/2024 4:07 PM

## 2024-02-01 NOTE — Consult Note (Signed)
 [facility] Psychiatric Consult Follow-up  Patient Name: .Jacob [name]  MRN: [id number]  DOB: [birth year]  Consult Order details:  Orders (From admission, onward)     Start     Ordered   01/28/24 2226  CONSULT TO CALL ACT TEAM       Comments: Bed hold for [facility] - [facility] reportedly wants BG controlled for 24 hours - pt is voluntary  Ordering Provider: Wynetta Fines, MD  Provider:  (Not yet assigned)  Question:  Reason for Consult?  Answer:  Psych consult   01/28/24 2226             Mode of Visit: In person    Psychiatry Consult Evaluation  Service Date: February 01, 2024 LOS: 3 days Chief Complaint acute psychosis  Primary Psychiatric Diagnoses  Schizophrenia 2.  Schizoaffective disorder   Assessment  Jacob [name] is a 33 y.o. male admitted from [facility] urgent care facility due to increased paranoia and medical clearance.  Presented to the EDfor 01/28/2024  3:30 PM for paranoia. He carries the psychiatric diagnoses of schizoaffective disorder, bipolar disorder, schizophrenia major depressive disorder and has a past medical history of diabetes.   His current presentation of disorganized thoughts, delusional and pressured speech is most consistent with schizoaffective disorder. He meets criteria for inpatient admission based on noncompliance with medication.  Denied that he is currently followed by therapy or psychiatry.  States he has been homeless for the past 18's months.  Reports his mother and grandmother currently with charged with conspiracy and tampering with witnesses. Please see plan below for detailed recommendations.   Jacob [name] is resting in bed observed responding to internal stimuli.  Jacob [name] noted to be on a tangent related to multiple "murder attempts" on his life; patient's paranoid tangential and disorganized throughout this assessment He is oriented x person and place; appears to be calm/cooperative, and is redirectable.  Patient is speaking in a clear tone  at moderate volume, speech is pressured thought process is irrelevant and incoherent at times. Patient denies suicidal/self-harm/homicidal ideation, patient presents acutely psychotic however, did remain calm throughout this assessment however is very animated with questions.  02/01/2024  Upon reevaluation, patient presents meaningfully better, however, still continues to present with symptomology consistent with acute decompensation, particularly manic symptomology, though is frequently also being observed by staff RTIS (psychosis), thus the recommendation will remain for inpatient mental health hospitalization, for further stabilization and safety.  Patient has been accepted to [facility] for today.  Team to coordinate transfer at this time.  Diagnoses:  Active Hospital problems: Principal Problem:   Schizoaffective disorder, bipolar type (HCC) Active Problems:   Psychosis (HCC)    Plan   ## Psychiatric Medication Recommendations:  - Continue Risperidone 1 mg BID  -Consider Risperdal Consta 25 mg every 2 weeks  ## Medical Decision Making Capacity: Not specifically addressed in this encounter  ## Further Work-up: None  ## Disposition:-- We recommend inpatient psychiatric hospitalization when medically cleared. Patient is under voluntary admission status at this time; please IVC if attempts to leave hospital.  ## Behavioral / Environmental: - safety and agitation precautions    ## Safety and Observation Level:  - Based on my clinical evaluation, I estimate the patient to be at low risk of self harm in the current setting. - At this time, we recommend  1:1 Observation. This decision is based on my review of the chart including patient's history and current presentation, interview of the patient, mental status examination, and consideration  of suicide risk including evaluating suicidal ideation, plan, intent, suicidal or self-harm behaviors, risk factors, and protective factors. This judgment  is based on our ability to directly address suicide risk, implement suicide prevention strategies, and develop a safety plan while the patient is in the clinical setting. Please contact our team if there is a concern that risk level has changed.  CSSR Risk Category:C-SSRS RISK CATEGORY: No Risk  Suicide Risk Assessment: Patient has following modifiable risk factors for suicide: recklessness, medication noncompliance, and lack of access to outpatient mental health resources, which we are addressing by noncompliance with medication sideration for long-acting injectable. Patient has following non-modifiable or demographic risk factors for suicide: male gender and psychiatric hospitalization Patient has the following protective factors against suicide: Access to outpatient mental health care consideration for ACT team services at discharge  Thank you for this consult request. Recommendations have been communicated to the primary team.  We will recommend inpatient admission at this time.   Lenox Ponds, NP      History of Present Illness  Relevant Aspects of Hospital ED Course:  Admitted on 01/28/2024 for acute psychosis, delusional paranoid ideations.    Patient Report: Patient was seen and evaluated face-to-face by this provider.  Continues to present tangential disorganized but pleasant.  Denied thoughts to harm himself or anybody else.  Does report he is originally from [place] and feels that people are after him due to an inheritance. "  My 401(k) has a lot of money and people are trying to kill me they are making attempts on my life."  Pt seen at [facility] for face to face psychiatric reevaluation. Pt remains pleasant, however continues to be grandiose, paranoid, and delusional. Today, patient mentions a spider and spider web in his stomach that he is able to spit out acid. He feels like the banana he ate this morning upset the web in his stomach. Today, he does appear less paranoid and  persecutory, and more so grandiose. Pt stated he has [employer] workers in court with him, and his stocks are worth "gillions and trillions of dollars."  He continues to deny SI. Denies HI. Denies AVH. However, nursing staff confirmed that he continues to respond to internal stimuli while in his room alone, often laughing inappropriately and speaking to himself.   Will continue with Risperidone 1 mg BID. Will continue to recommend inpatient psychiatric treatment. Pt blood sugars have improved, and [facility] will be reviewing patient today.   Psych ROS:  Depression: Denied Anxiety: Denied Mania (lifetime and current): Current Psychosis: (lifetime and current): Delusional, tangential and disorganized  Collateral information:  See chart  02/01/2024  Patient seen today at the [facility] emergency department for face-to-face psychiatric reevaluation.   Upon reevaluation, patient endorses his mood as, "I'm doing great", with animated eye contact, an oddly bright and atypical affect, with an atypical and mildly elevated interpersonal style.  Patient endorses that he is sleeping and eating well, then shares that he wishes that nursing staff would provide him with extra snacks, but understands that he is diabetic, so there are, "limits".  Patient endorses no paranoia, ideas of reference, and/or delusional themes overtly. Patient endorses good toleration of medications, no appreciable EPS symptomology, subjectively endorsed, and/or upon physical assessment.  Patient endorses no auditory and or visual hallucinations, denies suicidal and/or homicidal ideations, and objectively, does not appear to be responding to internal and/or external stimuli.  Patient thought process remains significantly circumstantial to tangential with frequent derailments.  Patient orientation  is intact, no concerns for fluctuations in consciousness.  Patient speech is of an increased amount, and mildly rapid, but is much less pressured,  and he is easily interruptible.  Discussed with patient that the recommendation remains for inpatient mental health hospitalization, given concerns for safety and his stability, to which patient verbalized understanding and that he was amenable.  Nursing/chart review, 02/01/2024:  Patient is compliant medications.  Patient has not had any behavioral incidents of concern for safety.  Patient has been eating and sleeping normal.  Patient continues to significantly be observed responding to external stimuli, frequently seen by staff talking to individuals who are not clearly present.  Review of Systems  Psychiatric/Behavioral:  Negative for depression, hallucinations, substance abuse and suicidal ideas. The patient is not nervous/anxious and does not have insomnia.   All other systems reviewed and are negative.    Psychiatric and Social History  Psychiatric History:  Information collected from patient who is a poor historian  Prev Dx/Sx: Documented history related to schizoaffective disorder Current Psych Provider: Patient was restarted on Abilify Home Meds (current): Poor historian related to medication management Previous Med Trials:  Therapy:    Social History:  Developmental Hx: Unable to participate in assess delusional,tangential thought process Educational Hx: N/A Occupational Hx: N/A Legal Hx: N/A Living Situation: homeless Spiritual Hx: N/A Access to weapons/lethal means: Unknown no documented history related to suicidal or self injures behaviors.  Substance History Alcohol: Less than 10 Type of alcohol  Last Drink  Number of drinks per day  History of alcohol withdrawal seizures  History of DT's  Tobacco:  Illicit drugs: Urine drug screen positive for marijuana Prescription drug abuse:  Rehab hx:   Exam Findings  Physical Exam:  Vital Signs:  Temp:  [97.8 F (36.6 C)-98.4 F (36.9 C)] 98 F (36.7 C) (04/02 0953) Pulse Rate:  [67-68] 68 (04/02 0953) Resp:   [17-20] 20 (04/02 0953) BP: (107-126)/(62-84) 126/62 (04/02 0953) SpO2:  [100 %] 100 % (04/02 0953) Blood pressure 126/62, pulse 68, temperature 98 F (36.7 C), temperature source Oral, resp. rate 20, height 5\' 9"  (1.753 m), weight 59.9 kg, SpO2 100%. Body mass index is 19.5 kg/m.  Physical Exam Vitals and nursing note reviewed.  Constitutional:      General: He is not in acute distress.    Appearance: He is normal weight. He is not ill-appearing, toxic-appearing or diaphoretic.     Comments: Atypical and elevated interpersonal style  Pulmonary:     Effort: Pulmonary effort is normal.  Skin:    General: Skin is warm and dry.  Neurological:     Mental Status: He is alert and oriented to person, place, and time.     Motor: No weakness, tremor or seizure activity.     Comments: No appreciable EPS symptomology  Psychiatric:        Attention and Perception: He is inattentive (Mild). He does not perceive auditory or visual hallucinations.        Speech: Speech is tangential.        Behavior: Behavior is hyperactive (Mildly). Behavior is not withdrawn or combative. Behavior is cooperative.        Thought Content: Thought content is not paranoid or delusional (No overt expressions). Thought content does not include homicidal or suicidal ideation.        Cognition and Memory: Cognition and memory normal.        Judgment: Judgment is impulsive and inappropriate.     Mental Status Exam:  General Appearance: Improved hygiene, atypical and mildly elevated interpersonal style  Orientation: Oriented  Memory: Largely fair  Concentration: Mildly inattentive  Recall: Variable  Attention mildly inattentive  Eye Contact: Variable to fair  Speech: Increased amount, rapid, more interruptible though, less pressured  Language:  Good  Volume:   Fluctuations  Mood: "I am doing great"  Affect: Oddly bright  Thought Process: Significantly circumstantial to tangential with frequent derailments   Thought Content: Appropriate  Suicidal Thoughts:  No  Homicidal Thoughts:  No  Judgement: Impaired  Insight:  Lacking  Psychomotor Activity: Mildly increased, elevated  Akathisia:  No  Fund of Knowledge: Variable      Assets:  Resilience Social Support  Cognition:  WNL  ADL's:  Intact  AIMS (if indicated):   0     Patient was restarted on Abilify 15 mg daily Pending medical clearance continue to manage blood glucose levels -Consider consult for diabetes care coordinator  Other History   These have been pulled in through the EMR, reviewed, and updated if appropriate.  Family History:  The patient's family history includes Healthy in his father and mother.  Medical History: Past Medical History:  Diagnosis Date   Bipolar 1 disorder (HCC)    History of attempted suicide 2019   Unsuccessful suicide attempt in 2019 with rat poison   Schizoaffective disorder (HCC)    Type 1 diabetes mellitus on insulin therapy (HCC) 2019   Vitamin D deficiency 01/03/2024    Surgical History: History reviewed. No pertinent surgical history.   Medications:   Current Facility-Administered Medications:    insulin aspart (novoLOG) injection 0-5 Units, 0-5 Units, Subcutaneous, QHS, [name], MD, 2 Units at 01/30/24 2103   insulin aspart (novoLOG) injection 0-9 Units, 0-9 Units, Subcutaneous, TID WC, [name], MD, 3 Units at 02/01/24 1252   insulin aspart (novoLOG) injection 10 Units, 10 Units, Subcutaneous, TID AC, Rolan Bucco, MD, 10 Units at 02/01/24 1253   [START ON 02/02/2024] insulin glargine-yfgn (SEMGLEE) injection 30 Units, 30 Units, Subcutaneous, Daily, Belfi, Melanie, MD   risperiDONE (RISPERDAL) tablet 1 mg, 1 mg, Oral, BID, Eligha Bridegroom, NP, 1 mg at 02/01/24 1056   traZODone (DESYREL) tablet 50 mg, 50 mg, Oral, QHS PRN, Eligha Bridegroom, NP, 50 mg at 01/31/24 2205   Vitamin D (Ergocalciferol) (DRISDOL) 1.25 MG (50000 UNIT) capsule 50,000 Units, 50,000  Units, Oral, Q7 days, Wynetta Fines, MD, 50,000 Units at 01/30/24 1022  Current Outpatient Medications:    ARIPiprazole (ABILIFY) 15 MG tablet, Take 1 tablet (15 mg total) by mouth daily., Disp: 30 tablet, Rfl: 11   insulin glargine (LANTUS) 100 UNIT/ML injection, Inject 0.24 mLs (24 Units total) into the skin daily for 90 doses., Disp: 30 mL, Rfl: 0   insulin lispro (HUMALOG) 100 UNIT/ML KwikPen, Inject 10 Units into the skin 3 (three) times daily before meals., Disp: 27 mL, Rfl: 0   Vitamin D, Ergocalciferol, (DRISDOL) 1.25 MG (50000 UNIT) CAPS capsule, Take 1 capsule (50,000 Units total) by mouth every 7 (seven) days., Disp: 4 capsule, Rfl: 1   glucose blood (ACCU-CHEK GUIDE TEST) test strip, Use up to 3 strips a day when not wearing Continuous glucose monitor, Disp: 50 each, Rfl: 12   Insulin Pen Needle (BD PEN NEEDLE NANO 2ND GEN) 32G X 4 MM MISC, Use to inject insulin up to 4 times a day, Disp: 200 each, Rfl: 3   Lancet Device MISC, 1 each by Does not apply route 3 (three) times daily.  May dispense any manufacturer covered by patient's insurance., Disp: 1 each, Rfl: 0  Allergies: No Known Allergies  Lenox Ponds, NP

## 2024-02-02 ENCOUNTER — Encounter (HOSPITAL_COMMUNITY): Payer: Self-pay | Admitting: Psychiatry

## 2024-02-02 ENCOUNTER — Other Ambulatory Visit: Payer: Self-pay

## 2024-02-02 ENCOUNTER — Inpatient Hospital Stay (HOSPITAL_COMMUNITY)
Admission: AD | Admit: 2024-02-02 | Discharge: 2024-02-09 | DRG: 885 | Disposition: A | Discharge status: Home / Self Care | Payer: MEDICAID | Source: Intra-hospital | Attending: Psychiatry | Admitting: Psychiatry

## 2024-02-02 DIAGNOSIS — Z9151 Personal history of suicidal behavior: Secondary | ICD-10-CM

## 2024-02-02 DIAGNOSIS — E559 Vitamin D deficiency, unspecified: Secondary | ICD-10-CM | POA: Diagnosis present

## 2024-02-02 DIAGNOSIS — Z79899 Other long term (current) drug therapy: Secondary | ICD-10-CM | POA: Diagnosis not present

## 2024-02-02 DIAGNOSIS — F419 Anxiety disorder, unspecified: Secondary | ICD-10-CM | POA: Diagnosis present

## 2024-02-02 DIAGNOSIS — F17290 Nicotine dependence, other tobacco product, uncomplicated: Secondary | ICD-10-CM | POA: Diagnosis present

## 2024-02-02 DIAGNOSIS — Z560 Unemployment, unspecified: Secondary | ICD-10-CM

## 2024-02-02 DIAGNOSIS — E1065 Type 1 diabetes mellitus with hyperglycemia: Secondary | ICD-10-CM | POA: Diagnosis present

## 2024-02-02 DIAGNOSIS — Z5901 Sheltered homelessness: Secondary | ICD-10-CM | POA: Diagnosis not present

## 2024-02-02 DIAGNOSIS — Z5941 Food insecurity: Secondary | ICD-10-CM | POA: Diagnosis not present

## 2024-02-02 DIAGNOSIS — F17200 Nicotine dependence, unspecified, uncomplicated: Secondary | ICD-10-CM | POA: Diagnosis present

## 2024-02-02 DIAGNOSIS — Z91148 Patient's other noncompliance with medication regimen for other reason: Secondary | ICD-10-CM

## 2024-02-02 DIAGNOSIS — Z62810 Personal history of physical and sexual abuse in childhood: Secondary | ICD-10-CM | POA: Diagnosis not present

## 2024-02-02 DIAGNOSIS — Z794 Long term (current) use of insulin: Secondary | ICD-10-CM

## 2024-02-02 DIAGNOSIS — Z5982 Transportation insecurity: Secondary | ICD-10-CM | POA: Diagnosis not present

## 2024-02-02 DIAGNOSIS — F250 Schizoaffective disorder, bipolar type: Secondary | ICD-10-CM | POA: Diagnosis present

## 2024-02-02 LAB — GLUCOSE, CAPILLARY
Glucose-Capillary: 195 mg/dL — ABNORMAL HIGH (ref 70–99)
Glucose-Capillary: 348 mg/dL — ABNORMAL HIGH (ref 70–99)
Glucose-Capillary: 292 mg/dL — ABNORMAL HIGH (ref 70–99)
Glucose-Capillary: 238 mg/dL — ABNORMAL HIGH (ref 70–99)

## 2024-02-02 MED ORDER — INSULIN ASPART 100 UNIT/ML IJ SOLN
0.0000 [IU] | Freq: Three times a day (TID) | INTRAMUSCULAR | Status: DC
  Administered 2024-02-02: 7 [IU] via SUBCUTANEOUS
  Administered 2024-02-02: 5 [IU] via SUBCUTANEOUS
  Administered 2024-02-02: 2 [IU] via SUBCUTANEOUS
  Administered 2024-02-03: 7 [IU] via SUBCUTANEOUS
  Administered 2024-02-03: 3 [IU] via SUBCUTANEOUS
  Administered 2024-02-03 – 2024-02-04 (×2): 7 [IU] via SUBCUTANEOUS
  Administered 2024-02-04: 5 [IU] via SUBCUTANEOUS
  Administered 2024-02-04: 3 [IU] via SUBCUTANEOUS
  Administered 2024-02-05 (×2): 2 [IU] via SUBCUTANEOUS
  Administered 2024-02-05: 5 [IU] via SUBCUTANEOUS
  Administered 2024-02-06: 2 [IU] via SUBCUTANEOUS
  Administered 2024-02-06: 3 [IU] via SUBCUTANEOUS
  Administered 2024-02-06: 1 [IU] via SUBCUTANEOUS
  Administered 2024-02-07: 2 [IU] via SUBCUTANEOUS
  Administered 2024-02-07: 5 [IU] via SUBCUTANEOUS
  Administered 2024-02-08: 2 [IU] via SUBCUTANEOUS
  Administered 2024-02-08: 3 [IU] via SUBCUTANEOUS
  Administered 2024-02-08: 7 [IU] via SUBCUTANEOUS
  Administered 2024-02-09: 5 [IU] via SUBCUTANEOUS
  Administered 2024-02-09: 9 [IU] via SUBCUTANEOUS

## 2024-02-02 MED ORDER — LORAZEPAM 2 MG/ML IJ SOLN: 2.0000 mg | Freq: Three times a day (TID) | INTRAMUSCULAR | Status: DC | PRN

## 2024-02-02 MED ORDER — HYDROXYZINE HCL 25 MG PO TABS
25.0000 mg | ORAL_TABLET | Freq: Three times a day (TID) | ORAL | Status: DC | PRN
  Administered 2024-02-07: 25 mg via ORAL
  Filled 2024-02-02: qty 1

## 2024-02-02 MED ORDER — ALUM & MAG HYDROXIDE-SIMETH 200-200-20 MG/5ML PO SUSP: 30.0000 mL | ORAL | Status: DC | PRN

## 2024-02-02 MED ORDER — DIPHENHYDRAMINE HCL 50 MG/ML IJ SOLN: 50.0000 mg | Freq: Three times a day (TID) | INTRAMUSCULAR | Status: DC | PRN

## 2024-02-02 MED ORDER — HALOPERIDOL LACTATE 5 MG/ML IJ SOLN: 10.0000 mg | Freq: Three times a day (TID) | INTRAMUSCULAR | Status: DC | PRN

## 2024-02-02 MED ORDER — DIPHENHYDRAMINE HCL 25 MG PO CAPS: 50.0000 mg | ORAL_CAPSULE | Freq: Three times a day (TID) | ORAL | Status: DC | PRN

## 2024-02-02 MED ORDER — MAGNESIUM HYDROXIDE 400 MG/5ML PO SUSP: 30.0000 mL | Freq: Every day | ORAL | Status: DC | PRN

## 2024-02-02 MED ORDER — HALOPERIDOL 5 MG PO TABS: 5.0000 mg | ORAL_TABLET | Freq: Three times a day (TID) | ORAL | Status: DC | PRN

## 2024-02-02 MED ORDER — RISPERIDONE 1 MG PO TABS
1.0000 mg | ORAL_TABLET | Freq: Two times a day (BID) | ORAL | Status: DC
  Administered 2024-02-02: 1 mg via ORAL
  Filled 2024-02-02 (×3): qty 1

## 2024-02-02 MED ORDER — ARIPIPRAZOLE 15 MG PO TABS
15.0000 mg | ORAL_TABLET | Freq: Every day | ORAL | Status: DC
Start: 2024-02-02 — End: 2024-02-04
  Administered 2024-02-02 – 2024-02-04 (×3): 15 mg via ORAL
  Filled 2024-02-02 (×5): qty 1

## 2024-02-02 MED ORDER — TRAZODONE HCL 50 MG PO TABS
50.0000 mg | ORAL_TABLET | Freq: Every evening | ORAL | Status: DC | PRN
  Administered 2024-02-02 (×2): 50 mg via ORAL
  Filled 2024-02-02 (×3): qty 1

## 2024-02-02 MED ORDER — INSULIN ASPART 100 UNIT/ML IJ SOLN
0.0000 [IU] | Freq: Every day | INTRAMUSCULAR | Status: DC
Start: 2024-02-02 — End: 2024-02-09
  Administered 2024-02-02 – 2024-02-08 (×3): 2 [IU] via SUBCUTANEOUS

## 2024-02-02 MED ORDER — VITAMIN D (ERGOCALCIFEROL) 1.25 MG (50000 UNIT) PO CAPS
50000.0000 [IU] | ORAL_CAPSULE | ORAL | Status: DC
  Administered 2024-02-06: 50000 [IU] via ORAL
  Filled 2024-02-02: qty 1

## 2024-02-02 MED ORDER — INSULIN ASPART 100 UNIT/ML IJ SOLN
10.0000 [IU] | Freq: Three times a day (TID) | INTRAMUSCULAR | Status: DC
  Administered 2024-02-02 – 2024-02-04 (×7): 10 [IU] via SUBCUTANEOUS

## 2024-02-02 MED ORDER — HALOPERIDOL LACTATE 5 MG/ML IJ SOLN: 5.0000 mg | Freq: Three times a day (TID) | INTRAMUSCULAR | Status: DC | PRN

## 2024-02-02 MED ORDER — INSULIN GLARGINE-YFGN 100 UNIT/ML ~~LOC~~ SOLN
30.0000 [IU] | Freq: Every day | SUBCUTANEOUS | Status: DC
  Administered 2024-02-02 – 2024-02-04 (×3): 30 [IU] via SUBCUTANEOUS

## 2024-02-02 MED ORDER — ACETAMINOPHEN 325 MG PO TABS
650.0000 mg | ORAL_TABLET | Freq: Four times a day (QID) | ORAL | Status: DC | PRN
  Administered 2024-02-03 – 2024-02-09 (×9): 650 mg via ORAL
  Filled 2024-02-02 (×9): qty 2

## 2024-02-02 NOTE — Group Note (Signed)
 Date:  02/02/2024 Time:  8:44 AM  Group Topic/Focus:  Goals Group:   The focus of this group is to help patients establish daily goals to achieve during treatment and discuss how the patient can incorporate goal setting into their daily lives to aide in recovery.    Participation Level:  Active  Participation Quality:  Appropriate  Affect:  Appropriate  Erasmo Score 02/02/2024, 8:44 AM

## 2024-02-02 NOTE — Progress Notes (Addendum)
 Admission Note:   Jacob [name] is a 33 yr old male who presented voluntarily to the ED for a mental health evaluation. He verbalized since he has been discharged, he has been unable to get his medication prescriptions filled. He has been without medications. He denies SI, HI, and AVH. He states his mom dropped him [place] the bus depot and he has no where to stay. The patient verbalized he has been "bouncing back and forth through behavioral facilities as a  means to have somewhere to stay."  On assessment, he answers questions appropriately. His speech is logical but pressured. He is circumstantial when he is talking. He does not appear to be preoccupied during the interview. He is cooperative. He is euphoric.   Skin was assessed and found to be clear of any abnormal marks. Pt searched and no contraband found, POC and unit policies explained and understanding verbalized. Consents obtained. Food and fluids offered, and fluids accepted. Pt had no additional questions or concerns.

## 2024-02-02 NOTE — Tx Team (Signed)
 Initial Treatment Plan 02/02/2024 5:15 AM Jes Claudine Mouton ZOX:[id number]    PATIENT STRESSORS: Marital or family conflict   Other: Homeless     PATIENT STRENGTHS: Physical Health  Supportive family/friends    PATIENT IDENTIFIED PROBLEMS: Issues and arguments with mother  Homeless/Unemployment                   DISCHARGE CRITERIA:  Improved stabilization in mood, thinking, and/or behavior Reduction of life-threatening or endangering symptoms to within safe limits Verbal commitment to aftercare and medication compliance  PRELIMINARY DISCHARGE PLAN: Placement in alternative living arrangements  PATIENT/FAMILY INVOLVEMENT: This treatment plan has been presented to and reviewed with the patient, Tiny Scheffler  The patient has been given the opportunity to ask questions and make suggestions.  Marja Kays, RN 02/02/2024, 5:15 AM

## 2024-02-02 NOTE — Progress Notes (Signed)
   02/02/24 0559  15 Minute Checks  Location Nurses Station  Visual Appearance Calm  Behavior Composed  Sleep (Behavioral Health Patients Only)  Calculate sleep? (Click Yes once per 24 hr at 0600 safety check) Yes  Documented sleep last 24 hours 3.75
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
## 2024-02-02 NOTE — Progress Notes (Addendum)
 Patient denies SI/HI/AVH this morning. Pt rates his depression a 0/10 and anxiety a 0/10. Pt reports that he slept "well" last night. Pt has been restless, pacing the unit throughout the day. Pt presents with pressured speech and circumstantial/ disorganized thought process. RN educated patient about the importance of following a low carb diet to prevent a high blood sugar. Pt verbalizes understanding however blood sugar continues to be elevated throughout the day. Pt has been interactive on the unit and participating in groups throughout the day. Patient has been compliant with medications and treatment plan. Q 15 minute safety checks are in place for patient's safety. Patient is currently safe on the unit.   02/02/24 0800  Psych Admission Type (Psych Patients Only)  Admission Status Voluntary  Psychosocial Assessment  Patient Complaints Restlessness;Anxiety  Eye Contact Fair  Facial Expression Animated  Affect Euphoric  Speech Pressured;Rapid  Interaction Assertive  Motor Activity Fidgety  Appearance/Hygiene Disheveled  Behavior Characteristics Fidgety  Mood Euphoric  Thought Process  Coherency Circumstantial;Flight of ideas  Content Blaming others  Delusions None reported or observed  Perception WDL  Hallucination None reported or observed  Judgment Poor  Confusion None  Danger to Self  Current suicidal ideation? Denies  Danger to Others  Danger to Others None reported or observed

## 2024-02-02 NOTE — Progress Notes (Signed)
  Jacob [name]   Type of Note: ACT Team  Spoke with patient regarding having an ACT team. Pt reports never having one in the past. Pt interested in these enhanced services.  Reached out to Genta with [facility] who reports they are now accepting new clients. Requested team come meet with patient, awaiting response.  CSW will continue to follow.  Signed:  Pailyn Bellevue, LCSW-A 02/02/2024  1:53 PM

## 2024-02-02 NOTE — Progress Notes (Signed)
 Writer doing 15 minutes safety checks. Jacob [name] laying across the bed in his room no clothing from the wait down exposed nude body. Writer put a sheet on Jacob [name] to cover his exposed nude body. Writer educated Jacob [name] all [facility] patients at all times has to be covered. At no time can exposed nude body be exposed. RN notify.

## 2024-02-02 NOTE — [facility] Group Notes (Signed)
 Psychoeducational Group Note  Date:  02/02/2024 Time:  2000  Group Topic/Focus:  Wrap up group  Participation Level: Did Not Attend  Participation Quality:  Not Applicable  Affect:  Not Applicable  Cognitive:  Not Applicable  Insight:  Not Applicable  Engagement in Group: Not Applicable  Additional Comments:  Did not attend.   Marcille Buffy 02/02/2024, 9:35 PM
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
## 2024-02-02 NOTE — Plan of Care (Signed)
 Pt just arrived at midnight. Problem: Safety: Goal: Periods of time without injury will increase Outcome: Progressing

## 2024-02-02 NOTE — Plan of Care (Signed)
   Problem: Education: Goal: Emotional status will improve Outcome: Progressing Goal: Mental status will improve Outcome: Progressing   Problem: Activity: Goal: Interest or engagement in activities will improve Outcome: Progressing Goal: Sleeping patterns will improve Outcome: Progressing

## 2024-02-02 NOTE — [facility] Suicide Risk Assessment (Signed)
 Suicide Risk Assessment  Admission Assessment    [facility] Admission Suicide Risk Assessment   Nursing information obtained from:  Patient Demographic factors:  Male, Low socioeconomic status, Unemployed Current Mental Status:  NA Loss Factors:  NA Historical Factors:  NA Risk Reduction Factors:  Positive social support  Total Time spent with patient: 30 minutes Principal Problem: Schizoaffective disorder, bipolar type (HCC) Diagnosis:  Principal Problem:   Schizoaffective disorder, bipolar type (HCC) Active Problems:   Uncontrolled type 1 diabetes mellitus with hyperglycemia, with long-term current use of insulin (HCC)  Subjective Data: Jacob [name], is a 33 year old male, with a significant psychiatric history of schizophrenia and medication noncompliance.  On March 29, he presented to [facility] urgent care seeking housing and reported increased paranoia and auditory hallucinations.  He was subsequently transferred to [facility] emergency department due to hyperglycemia, with a recorded blood sugar level of 750.  After receiving medical clearance, he was admitted to [facility] on April 2 for further psychiatric care.  Continued Clinical Symptoms:  Alcohol Use Disorder Identification Test Final Score (AUDIT): 0 The "Alcohol Use Disorders Identification Test", Guidelines for Use in Primary Care, Second Edition.  World Science writer [facility]). Score between 0-7:  no or low risk or alcohol related problems. Score between 8-15:  moderate risk of alcohol related problems. Score between 16-19:  high risk of alcohol related problems. Score 20 or above:  warrants further diagnostic evaluation for alcohol dependence and treatment.   CLINICAL FACTORS:   Schizophrenia:   Paranoid or undifferentiated type Currently Psychotic Unstable or Poor Therapeutic Relationship Previous Psychiatric Diagnoses and Treatments Medical Diagnoses and  Treatments/Surgeries   Musculoskeletal: Strength & Muscle Tone: within normal limits Gait & Station: normal Patient leans: N/A  Psychiatric Specialty Exam:  Presentation  General Appearance:  Casual; Fairly Groomed  Eye Contact: Good  Speech: Pressured  Speech Volume: Normal  Handedness: Right   Mood and Affect  Mood: Anxious; Euphoric  Affect: Labile   Thought Process  Thought Processes: Coherent; Goal Directed  Descriptions of Associations:Tangential  Orientation:Partial  Thought Content:Paranoid Ideation; Delusions; Tangential  History of Schizophrenia/Schizoaffective disorder:Yes  Duration of Psychotic Symptoms:Greater than six months  Hallucinations:Hallucinations: None  Ideas of Reference:Delusions  Suicidal Thoughts:Suicidal Thoughts: No  Homicidal Thoughts:Homicidal Thoughts: No   Sensorium  Memory: Immediate Fair; Recent Fair  Judgment: Impaired  Insight: Lacking   Executive Functions  Concentration: Fair  Attention Span: Fair  Recall: [employer] of Knowledge: Fair  Language: Fair   Psychomotor Activity  Psychomotor Activity:Psychomotor Activity: Restlessness   Assets  Assets: Manufacturing systems engineer; Desire for Improvement   Sleep  Sleep:Sleep: Fair    Physical Exam: Physical Exam See H&P ROS See H&P Blood pressure 133/88, pulse 85, temperature 97.6 F (36.4 C), temperature source Oral, resp. rate 16, height 5\' 9"  (1.753 m), weight 55.3 kg, SpO2 100%. Body mass index is 18.02 kg/m.   COGNITIVE FEATURES THAT CONTRIBUTE TO RISK:  Closed-mindedness    SUICIDE RISK:   Minimal: No identifiable suicidal ideation.  Patients presenting with no risk factors but with morbid ruminations; may be classified as minimal risk based on the severity of the depressive symptoms  PLAN OF CARE: See H&P  I certify that inpatient services furnished can reasonably be expected to improve the patient's condition.    Norma Fredrickson, NP 02/02/2024, 1:40 PM

## 2024-02-02 NOTE — Plan of Care (Signed)
   Problem: Education: Goal: Knowledge of [facility] Education information/materials will improve Outcome: Progressing Goal: Emotional status will improve Outcome: Progressing Goal: Mental status will improve Outcome: Progressing Goal: Verbalization of understanding the information provided will improve Outcome: Progressing

## 2024-02-02 NOTE — Progress Notes (Signed)
   02/02/24 2300  Psych Admission Type (Psych Patients Only)  Admission Status Voluntary  Psychosocial Assessment  Patient Complaints None  Eye Contact Fair  Facial Expression Animated  Affect Appropriate to circumstance  Speech Logical/coherent  Interaction Assertive  Motor Activity Other (Comment) (WNL)  Appearance/Hygiene Disheveled  Behavior Characteristics Anxious  Mood Euthymic  Thought Process  Coherency Circumstantial  Content Blaming others  Delusions None reported or observed  Perception WDL  Hallucination None reported or observed  Judgment Poor  Confusion None  Danger to Self  Current suicidal ideation? Denies  Danger to Others  Danger to Others None reported or observed

## 2024-02-03 ENCOUNTER — Encounter (HOSPITAL_COMMUNITY): Payer: Self-pay

## 2024-02-03 DIAGNOSIS — F250 Schizoaffective disorder, bipolar type: Secondary | ICD-10-CM | POA: Diagnosis not present

## 2024-02-03 LAB — GLUCOSE, CAPILLARY
Glucose-Capillary: 316 mg/dL — ABNORMAL HIGH (ref 70–99)
Glucose-Capillary: 313 mg/dL — ABNORMAL HIGH (ref 70–99)
Glucose-Capillary: 249 mg/dL — ABNORMAL HIGH (ref 70–99)
Glucose-Capillary: 208 mg/dL — ABNORMAL HIGH (ref 70–99)

## 2024-02-03 NOTE — Progress Notes (Signed)
   02/03/24 0830  Psych Admission Type (Psych Patients Only)  Admission Status Voluntary  Psychosocial Assessment  Patient Complaints None  Eye Contact Fair  Facial Expression Animated  Affect Appropriate to circumstance  Speech Logical/coherent  Interaction Assertive  Motor Activity Other (Comment) (WNL)  Appearance/Hygiene Disheveled  Behavior Characteristics Anxious  Mood Pleasant  Thought Process  Coherency Circumstantial  Content Blaming others  Delusions None reported or observed  Perception WDL  Hallucination None reported or observed  Judgment Poor  Confusion None  Danger to Self  Current suicidal ideation? Denies  Agreement Not to Harm Self Yes  Description of Agreement verbal  Danger to Others  Danger to Others None reported or observed

## 2024-02-03 NOTE — BH IP Treatment Plan (Signed)
 Interdisciplinary Treatment and Diagnostic Plan Update  02/03/2024 Time of Session: 10:35 AM Jacob [name] MRN: [id number]  Principal Diagnosis: Schizoaffective disorder, bipolar type (HCC)  Secondary Diagnoses: Principal Problem:   Schizoaffective disorder, bipolar type (HCC) Active Problems:   Uncontrolled type 1 diabetes mellitus with hyperglycemia, with long-term current use of insulin (HCC)   Current Medications:  Current Facility-Administered Medications  Medication Dose Route Frequency Provider Last Rate Last Admin   acetaminophen (TYLENOL) tablet 650 mg  650 mg Oral Q6H PRN Lenox Ponds, NP   650 mg at 02/03/24 0746   alum & mag hydroxide-simeth (MAALOX/MYLANTA) 200-200-20 MG/5ML suspension 30 mL  30 mL Oral Q4H PRN Lenox Ponds, NP       ARIPiprazole (ABILIFY) tablet 15 mg  15 mg Oral Daily Bennett, Christal H, NP   15 mg at 02/03/24 0746   haloperidol (HALDOL) tablet 5 mg  5 mg Oral TID PRN Lenox Ponds, NP       And   diphenhydrAMINE (BENADRYL) capsule 50 mg  50 mg Oral TID PRN Lenox Ponds, NP       haloperidol lactate (HALDOL) injection 5 mg  5 mg Intramuscular TID PRN Lenox Ponds, NP       And   diphenhydrAMINE (BENADRYL) injection 50 mg  50 mg Intramuscular TID PRN Lenox Ponds, NP       And   LORazepam (ATIVAN) injection 2 mg  2 mg Intramuscular TID PRN Lenox Ponds, NP       haloperidol lactate (HALDOL) injection 10 mg  10 mg Intramuscular TID PRN Lenox Ponds, NP       And   diphenhydrAMINE (BENADRYL) injection 50 mg  50 mg Intramuscular TID PRN Lenox Ponds, NP       And   LORazepam (ATIVAN) injection 2 mg  2 mg Intramuscular TID PRN Lenox Ponds, NP       hydrOXYzine (ATARAX) tablet 25 mg  25 mg Oral TID PRN Bennett, Christal H, NP       insulin aspart (novoLOG) injection 0-5 Units  0-5 Units Subcutaneous QHS Lenox Ponds, NP   2 Units at 02/02/24 2126   insulin aspart (novoLOG) injection 0-9 Units  0-9 Units  Subcutaneous TID WC Lenox Ponds, NP   7 Units at 02/03/24 1201   insulin aspart (novoLOG) injection 10 Units  10 Units Subcutaneous TID Link Snuffer, NP   10 Units at 02/03/24 1201   insulin glargine-yfgn (SEMGLEE) injection 30 Units  30 Units Subcutaneous Daily Lenox Ponds, NP   30 Units at 02/03/24 0745   magnesium hydroxide (MILK OF MAGNESIA) suspension 30 mL  30 mL Oral Daily PRN Lenox Ponds, NP       traZODone (DESYREL) tablet 50 mg  50 mg Oral QHS PRN Lenox Ponds, NP   50 mg at 02/02/24 2129   [START ON 02/06/2024] Vitamin D (Ergocalciferol) (DRISDOL) 1.25 MG (50000 UNIT) capsule 50,000 Units  50,000 Units Oral Q7 days Lenox Ponds, NP       PTA Medications: Medications Prior to Admission  Medication Sig Dispense Refill Last Dose/Taking   ARIPiprazole (ABILIFY) 15 MG tablet Take 1 tablet (15 mg total) by mouth daily. 30 tablet 11    glucose blood (ACCU-CHEK GUIDE TEST) test strip Use up to 3 strips a day when not wearing Continuous glucose monitor 50 each 12    insulin glargine (LANTUS) 100 UNIT/ML injection Inject 0.24  mLs (24 Units total) into the skin daily for 90 doses. 30 mL 0    insulin lispro (HUMALOG) 100 UNIT/ML KwikPen Inject 10 Units into the skin 3 (three) times daily before meals. 27 mL 0    Insulin Pen Needle (BD PEN NEEDLE NANO 2ND GEN) 32G X 4 MM MISC Use to inject insulin up to 4 times a day 200 each 3    Lancet Device MISC 1 each by Does not apply route 3 (three) times daily. May dispense any manufacturer covered by patient's insurance. 1 each 0    Vitamin D, Ergocalciferol, (DRISDOL) 1.25 MG (50000 UNIT) CAPS capsule Take 1 capsule (50,000 Units total) by mouth every 7 (seven) days. 4 capsule 1     Patient Stressors: Marital or family conflict   Other: Homeless    Patient Strengths: Physical Health  Supportive family/friends   Treatment Modalities: Medication Management, Group therapy, Case management,  1 to 1 session with clinician,  Psychoeducation, Recreational therapy.   Physician Treatment Plan for Primary Diagnosis: Schizoaffective disorder, bipolar type (HCC) Long Term Goal(s):     Short Term Goals:    Medication Management: Evaluate patient's response, side effects, and tolerance of medication regimen.  Therapeutic Interventions: 1 to 1 sessions, Unit Group sessions and Medication administration.  Evaluation of Outcomes: Not Progressing  Physician Treatment Plan for Secondary Diagnosis: Principal Problem:   Schizoaffective disorder, bipolar type (HCC) Active Problems:   Uncontrolled type 1 diabetes mellitus with hyperglycemia, with long-term current use of insulin (HCC)  Long Term Goal(s):     Short Term Goals:       Medication Management: Evaluate patient's response, side effects, and tolerance of medication regimen.  Therapeutic Interventions: 1 to 1 sessions, Unit Group sessions and Medication administration.  Evaluation of Outcomes: Not Progressing   RN Treatment Plan for Primary Diagnosis: Schizoaffective disorder, bipolar type (HCC) Long Term Goal(s): Knowledge of disease and therapeutic regimen to maintain health will improve  Short Term Goals: Ability to remain free from injury will improve, Ability to verbalize frustration and anger appropriately will improve, Ability to demonstrate self-control, Ability to participate in decision making will improve, Ability to verbalize feelings will improve, Ability to disclose and discuss suicidal ideas, Ability to identify and develop effective coping behaviors will improve, and Compliance with prescribed medications will improve  Medication Management: RN will administer medications as ordered by provider, will assess and evaluate patient's response and provide education to patient for prescribed medication. RN will report any adverse and/or side effects to prescribing provider.  Therapeutic Interventions: 1 on 1 counseling sessions, Psychoeducation,  Medication administration, Evaluate responses to treatment, Monitor vital signs and CBGs as ordered, Perform/monitor CIWA, COWS, AIMS and Fall Risk screenings as ordered, Perform wound care treatments as ordered.  Evaluation of Outcomes: Not Progressing   LCSW Treatment Plan for Primary Diagnosis: Schizoaffective disorder, bipolar type (HCC) Long Term Goal(s): Safe transition to appropriate next level of care at discharge, Engage patient in therapeutic group addressing interpersonal concerns.  Short Term Goals: Engage patient in aftercare planning with referrals and resources, Increase social support, Increase ability to appropriately verbalize feelings, Increase emotional regulation, Facilitate acceptance of mental health diagnosis and concerns, Facilitate patient progression through stages of change regarding substance use diagnoses and concerns, Identify triggers associated with mental health/substance abuse issues, and Increase skills for wellness and recovery  Therapeutic Interventions: Assess for all discharge needs, 1 to 1 time with Social worker, Explore available resources and support systems, Assess for adequacy in community  support network, Educate family and significant other(s) on suicide prevention, Complete Psychosocial Assessment, Interpersonal group therapy.  Evaluation of Outcomes: Not Progressing   Progress in Treatment: Attending groups: Yes. Participating in groups: Yes. Taking medication as prescribed: Yes. Toleration medication: Yes. Family/Significant other contact made: No, will contact:  [name] (mother) [phone] Patient understands diagnosis: Yes. Discussing patient identified problems/goals with staff: Yes. Medical problems stabilized or resolved: Yes. Denies suicidal/homicidal ideation: Yes. Issues/concerns per patient self-inventory:  No.  New problem(s) identified: No  New Short Term/Long Term Goal(s):    medication stabilization, elimination of  SI thoughts, development of comprehensive mental wellness plan.   Patient Goals:  "I have a brain freeze."  Patient hasn't been taking medications.  Discharge Plan or Barriers:  Patient recently admitted. CSW will continue to follow and assess for appropriate referrals and possible discharge planning.     Reason for Continuation of Hospitalization: Depression Hallucinations Medication stabilization  Estimated Length of Stay:  5 - 7 days  Last 3 [place] Suicide Severity Risk Score: Flowsheet Row [facility] (Current[facility] Most recent reading at 02/02/2024  5:04 AM ED from 01/28/2024 in [facility] at [facility] Most recent reading at 01/28/2024  5:39 PM ED from 01/28/2024 in [facility] Most recent reading at 01/28/2024 12:35 PM  C-SSRS RISK CATEGORY No Risk No Risk No Risk       Last PHQ 2/9 Scores:    01/09/2024    3:33 PM 08/22/2023    1:55 PM 08/01/2023    8:46 AM  Depression screen PHQ 2/9  Decreased Interest 0 0 0  Down, Depressed, Hopeless 0 0 0  PHQ - 2 Score 0 0 0  Altered sleeping 0  0  Tired, decreased energy 0    Change in appetite 0  0  Feeling bad or failure about yourself  0  0  Trouble concentrating 0  0  Moving slowly or fidgety/restless 0  0  Suicidal thoughts 0  0  PHQ-9 Score 0  0  Difficult doing work/chores Not difficult at all      Scribe for Treatment Team: Alla Feeling, LCSWA 02/03/2024 3:32 PM

## 2024-02-03 NOTE — Progress Notes (Signed)
 Collateral contact - Genta from [facility] Community Treatment Team (ACTT) [phone]   Orpah Melter said that someone will visit patient in the hospital and evaluate him for ACTT services on Monday, 02/06/2024 at 10:30 AM.     Read Drivers, LCSWA 02/03/2024

## 2024-02-03 NOTE — Progress Notes (Addendum)
 [facility] MD Progress Note  02/03/2024 10:20 PM Jacob [name]  MRN:  [id number] Principal Problem: Schizoaffective disorder, bipolar type (HCC) Diagnosis: Principal Problem:   Schizoaffective disorder, bipolar type (HCC) Active Problems:   Tobacco dependence   Uncontrolled type 1 diabetes mellitus with hyperglycemia, with long-term current use of insulin (HCC)   Vitamin D deficiency   History of Present Illness: Jacob [name], is a 33 year old male, with a significant psychiatric history of schizophrenia and medication noncompliance.  On March 29, he presented to [facility] urgent care seeking housing and reported increased paranoia and auditory hallucinations.  He was subsequently transferred to [facility] emergency department due to hyperglycemia, with a recorded blood sugar level of 750.  After receiving medical clearance, he was admitted to [facility] on April 2 for further psychiatric care.   Chart review from last 24 hours: Chart reviewed today. Patient discussed at multidisciplinary team meeting. Vital signs within defined range. Documented sleep hours: 8.5.  Nursing staff reports the patient was observed pacing intermittently. However, he is actively participating on the unit, and there are no current behavioral issues or concerns. He is compliant with routine psychotropic medication regimen without difficulty. Required as needed trazodone last night, according to the nursing record.   Yesterday the psychiatry team made the following recommendations:  Discontinue Risperdal 1 mg, 2 times daily on admission, Resume Abilify 15 mg, daily      Daily Evaluation: On today's assessment, the patient described his mood as "good."  He rated his depression as 0 and his anxiety as 7-8 on a scale of 0-10, with 10 being the most severe.  He reported having a "wonderful" appetite and stated that his energy is good and feels more positive than negative.  While he reports  overall good concentration, he states occasionally forgetting words, with the last occurrence being 2-3 days ago.  The patient reported difficulty falling asleep last night due to lower back pain, which he attributes to "pressure and gravity on earth."  He stated that Tylenol provided partial relief.  He endorses auditory hallucinations, describing indistinct voices he heard last night, which he states sounded like people talking, though he did not understand what was being said.  He reports that he continues to ignore these voices.  He denies visual hallucinations, paranoia, suicidal ideation, self-harm urges, or homicidal ideation. He reports that he is attending group sessions on the unit and finds them beneficial.  His stated daily goal is to "improve mobility, thought processes, and physical walking."  He denies any side effect from psychotropic medications.    During the assessment, the patient demonstrated ongoing delusional thinking but did not appear to be responding to internal or external stimuli.  No EPS symptoms were observed.  The case was discussed with the attending psychiatrist, N. Abbott Pao. Continued hospitalization is recommended at this time for monitoring of psychotic symptoms and further stabilization. The current psychiatric treatment regimen will continue.   Total Time spent with patient: 45 minutes  Past Psychiatric History: See H&P  Past Medical History:  Past Medical History:  Diagnosis Date   Bipolar 1 disorder (HCC)    History of attempted suicide 2019   Unsuccessful suicide attempt in 2019 with rat poison   Schizoaffective disorder (HCC)    Type 1 diabetes mellitus on insulin therapy (HCC) 2019   Vitamin D deficiency 01/03/2024   History reviewed. No pertinent surgical history. Family History:  Family History  Problem Relation Age of Onset   Healthy  Mother    Healthy Father    Family Psychiatric  History: See H&P Social History:  Social History   Substance  and Sexual Activity  Alcohol Use Yes   Comment: social/occassional     Social History   Substance and Sexual Activity  Drug Use Not Currently   Types: Marijuana    Social History   Socioeconomic History   Marital status: Single    Spouse name: Not on file   Number of children: Not on file   Years of education: Not on file   Highest education level: Not on file  Occupational History   Not on file  Tobacco Use   Smoking status: Former    Types: Cigars, Cigarettes   Smokeless tobacco: Never   Tobacco comments:    2 BLACK AND MILD A DAY  Vaping Use   Vaping status: Every Day  Substance and Sexual Activity   Alcohol use: Yes    Comment: social/occassional   Drug use: Not Currently    Types: Marijuana   Sexual activity: Not on file  Other Topics Concern   Not on file  Social History Narrative   Not on file   Social Drivers of Health   Financial Resource Strain: Not on file  Food Insecurity: Food Insecurity Present (02/02/2024)   Hunger Vital Sign    Worried About Running Out of Food in the Last Year: Sometimes true    Ran Out of Food in the Last Year: Sometimes true  Transportation Needs: Unmet Transportation Needs (02/02/2024)   PRAPARE - Administrator, Civil Service (Medical): Yes    Lack of Transportation (Non-Medical): No  Physical Activity: Not on file  Stress: Not on file  Social Connections: Socially Isolated (12/26/2023)   Social Connection and Isolation Panel [NHANES]    Frequency of Communication with Friends and Family: Never    Frequency of Social Gatherings with Friends and Family: Never    Attends Religious Services: Never    Database administrator or Organizations: No    Attends Engineer, structural: Never    Marital Status: Never married   Additional Social History:                         Current Medications: Current Facility-Administered Medications  Medication Dose Route Frequency Provider Last Rate Last Admin    acetaminophen (TYLENOL) tablet 650 mg  650 mg Oral Q6H PRN Lenox Ponds, NP   650 mg at 02/03/24 1830   alum & mag hydroxide-simeth (MAALOX/MYLANTA) 200-200-20 MG/5ML suspension 30 mL  30 mL Oral Q4H PRN Lenox Ponds, NP       ARIPiprazole (ABILIFY) tablet 15 mg  15 mg Oral Daily Louay Myrie H, NP   15 mg at 02/03/24 0746   haloperidol (HALDOL) tablet 5 mg  5 mg Oral TID PRN Lenox Ponds, NP       And   diphenhydrAMINE (BENADRYL) capsule 50 mg  50 mg Oral TID PRN Lenox Ponds, NP       haloperidol lactate (HALDOL) injection 5 mg  5 mg Intramuscular TID PRN Lenox Ponds, NP       And   diphenhydrAMINE (BENADRYL) injection 50 mg  50 mg Intramuscular TID PRN Lenox Ponds, NP       And   LORazepam (ATIVAN) injection 2 mg  2 mg Intramuscular TID PRN Lenox Ponds, NP  haloperidol lactate (HALDOL) injection 10 mg  10 mg Intramuscular TID PRN Lenox Ponds, NP       And   diphenhydrAMINE (BENADRYL) injection 50 mg  50 mg Intramuscular TID PRN Lenox Ponds, NP       And   LORazepam (ATIVAN) injection 2 mg  2 mg Intramuscular TID PRN Lenox Ponds, NP       hydrOXYzine (ATARAX) tablet 25 mg  25 mg Oral TID PRN Sonia Stickels H, NP       insulin aspart (novoLOG) injection 0-5 Units  0-5 Units Subcutaneous QHS Lenox Ponds, NP   2 Units at 02/03/24 2122   insulin aspart (novoLOG) injection 0-9 Units  0-9 Units Subcutaneous TID WC Lenox Ponds, NP   3 Units at 02/03/24 1722   insulin aspart (novoLOG) injection 10 Units  10 Units Subcutaneous TID Link Snuffer, NP   10 Units at 02/03/24 1722   insulin glargine-yfgn (SEMGLEE) injection 30 Units  30 Units Subcutaneous Daily Lenox Ponds, NP   30 Units at 02/03/24 0745   magnesium hydroxide (MILK OF MAGNESIA) suspension 30 mL  30 mL Oral Daily PRN Lenox Ponds, NP       traZODone (DESYREL) tablet 50 mg  50 mg Oral QHS PRN Lenox Ponds, NP   50 mg at 02/02/24 2129   [START ON  02/06/2024] Vitamin D (Ergocalciferol) (DRISDOL) 1.25 MG (50000 UNIT) capsule 50,000 Units  50,000 Units Oral Q7 days Lenox Ponds, NP        Lab Results:  Results for orders placed or performed during the hospital encounter of 02/02/24 (from the past 48 hours)  Glucose, capillary     Status: Abnormal   Collection Time: 02/02/24  5:48 AM  Result Value Ref Range   Glucose-Capillary 195 (H) 70 - 99 mg/dL    Comment: Glucose reference range applies only to samples taken after fasting for at least 8 hours.   Comment 1 Notify RN    Comment 2 Document in Chart   Glucose, capillary     Status: Abnormal   Collection Time: 02/02/24 11:49 AM  Result Value Ref Range   Glucose-Capillary 348 (H) 70 - 99 mg/dL    Comment: Glucose reference range applies only to samples taken after fasting for at least 8 hours.  Glucose, capillary     Status: Abnormal   Collection Time: 02/02/24  4:47 PM  Result Value Ref Range   Glucose-Capillary 292 (H) 70 - 99 mg/dL    Comment: Glucose reference range applies only to samples taken after fasting for at least 8 hours.  Glucose, capillary     Status: Abnormal   Collection Time: 02/02/24  8:36 PM  Result Value Ref Range   Glucose-Capillary 238 (H) 70 - 99 mg/dL    Comment: Glucose reference range applies only to samples taken after fasting for at least 8 hours.  Glucose, capillary     Status: Abnormal   Collection Time: 02/03/24  5:48 AM  Result Value Ref Range   Glucose-Capillary 316 (H) 70 - 99 mg/dL    Comment: Glucose reference range applies only to samples taken after fasting for at least 8 hours.   Comment 1 Notify RN    Comment 2 Document in Chart   Glucose, capillary     Status: Abnormal   Collection Time: 02/03/24 12:00 PM  Result Value Ref Range   Glucose-Capillary 313 (H) 70 - 99 mg/dL  Comment: Glucose reference range applies only to samples taken after fasting for at least 8 hours.  Glucose, capillary     Status: Abnormal   Collection Time:  02/03/24  5:15 PM  Result Value Ref Range   Glucose-Capillary 249 (H) 70 - 99 mg/dL    Comment: Glucose reference range applies only to samples taken after fasting for at least 8 hours.  Glucose, capillary     Status: Abnormal   Collection Time: 02/03/24  7:56 PM  Result Value Ref Range   Glucose-Capillary 208 (H) 70 - 99 mg/dL    Comment: Glucose reference range applies only to samples taken after fasting for at least 8 hours.    Blood Alcohol level:  Lab Results  Component Value Date   ETH <10 01/28/2024   ETH <10 01/28/2024    Metabolic Disorder Labs: Lab Results  Component Value Date   HGBA1C 14.8 (H) 01/28/2024   MPG 378.06 01/28/2024   MPG 280.48 10/23/2023   Lab Results  Component Value Date   PROLACTIN 18.7 12/27/2023   Lab Results  Component Value Date   CHOL 201 (H) 01/28/2024   TRIG 85 01/28/2024   HDL 75 01/28/2024   CHOLHDL 2.7 01/28/2024   VLDL 17 01/28/2024   LDLCALC 109 (H) 01/28/2024   LDLCALC 77 12/27/2023    Physical Findings: AIMS:  , ,  ,0  ,    CIWA:   N/A COWS:   N/A  Musculoskeletal: Strength & Muscle Tone: within normal limits Gait & Station: normal Patient leans: N/A  Psychiatric Specialty Exam:  Presentation  General Appearance:  Casual; Fairly Groomed  Eye Contact: Good  Speech: Pressured  Speech Volume: Normal  Handedness: Right   Mood and Affect  Mood: Anxious; Euphoric  Affect: Labile   Thought Process  Thought Processes: Coherent; Goal Directed  Descriptions of Associations:Tangential  Orientation:Partial  Thought Content:Paranoid Ideation; Delusions; Tangential  History of Schizophrenia/Schizoaffective disorder:Yes  Duration of Psychotic Symptoms:Greater than six months  Hallucinations:Hallucinations: None  Ideas of Reference:Delusions  Suicidal Thoughts:Suicidal Thoughts: No  Homicidal Thoughts:Homicidal Thoughts: No   Sensorium  Memory: Immediate Fair; Recent  Fair  Judgment: Impaired  Insight: Lacking   Executive Functions  Concentration: Fair  Attention Span: Fair  Recall: [employer] of Knowledge: Fair  Language: Fair   Psychomotor Activity  Psychomotor Activity: Psychomotor Activity: Restlessness   Assets  Assets: Communication Skills; Desire for Improvement   Sleep  Sleep: Sleep: Fair    Physical Exam: Physical Exam Vitals and nursing note reviewed.  Constitutional:      General: He is not in acute distress.    Appearance: He is normal weight. He is not ill-appearing.  Cardiovascular:     Rate and Rhythm: Normal rate.     Pulses: Normal pulses.  Neurological:     Mental Status: He is alert and oriented to person, place, and time.    Review of Systems  Constitutional: Negative.   Respiratory:  Negative for shortness of breath.   Cardiovascular:  Negative for chest pain and palpitations.  Gastrointestinal:  Negative for constipation, diarrhea, nausea and vomiting.  Musculoskeletal:  Negative for falls.  Skin: Negative.   Neurological:  Negative for dizziness, tingling, tremors and headaches.  Psychiatric/Behavioral:  Positive for depression, hallucinations and substance abuse. Negative for suicidal ideas. The patient is nervous/anxious and has insomnia.    Blood pressure 132/86, pulse 96, temperature 97.8 F (36.6 C), temperature source Oral, resp. rate 16, height 5\' 9"  (1.753 m), weight  55.3 kg, SpO2 100%. Body mass index is 18.02 kg/m.   Treatment Plan Summary: Daily contact with patient to assess and evaluate symptoms and progress in treatment and Medication management  ASSESSMENT: 33 year old male patient presents with a complex psychiatric history, including schizoaffective disorder, bipolar type, and a history of medication noncompliance. He reports symptoms consistent with persecutory and paranoid delusions, as well as depressive symptoms such as hopelessness, worthlessness, and difficulty  concentrating. Additionally, he endorses manic symptoms, including extreme high energy and euphoric feelings. His medical history includes uncontrolled type 1 diabetes mellitus, which may contribute to his overall health challenges. He denies current substance use but has a history of marijuana use one month ago and nicotine use one week ago.   Diagnoses / Active Problems: Principal Problem:   Schizoaffective disorder, bipolar type (HCC) Active Problems:   Uncontrolled type 1 diabetes mellitus with hyperglycemia, with long-term current use of insulin (HCC)              PLAN: Safety and Monitoring:             --  Voluntary admission to inpatient psychiatric unit for safety, stabilization and treatment             -- Daily contact with patient to assess and evaluate symptoms and progress in treatment             -- Patient's case to be discussed in multi-disciplinary team meeting             -- Observation Level : q15 minute checks             -- Vital signs:  q12 hours             -- Precautions: suicide, elopement, and assault   2. Psychiatric Diagnoses and Treatment:    # Schizoaffective disorder, bipolar type -- Discontinue Risperdal 1 mg, 2 times daily on admission -- Continue Abilify 15 mg, daily      --  The risks/benefits/side-effects/alternatives to this medication were discussed in detail with the patient and time was given for questions. The patient consents to medication trial.  -- FDA -- Metabolic profile and EKG monitoring obtained while on an atypical antipsychotic (BMI: Lipid Panel: HbgA1c: QTc:)    #Continue As Needed Medications --Hydroxyzine 25 mg oral, 3 times daily as needed, anxiety --Trazodone 50 mg, oral, daily at bedtime as needed, sleep   #Continue BH Agitation Protocol --Haldol 5 mg, oral, 3 times daily as needed, mild agitation --Benadryl 50 mg, oral, 3 times daily as needed, mild agitation                                     OR  --Haldol injection 5 mg,  IM, 3 times daily as needed, moderate agitation --Benadryl injection 50 mg, IM, 3 times daily as needed, moderate agitation --Ativan injection 2 mg, IM, 3 times daily as needed, moderate agitation                                     OR --Haldol injection 10 mg, IM, 3 times daily as needed, severe agitation --Benadryl injection 50 mg, IM, 3 times daily as needed, severe agitation --Ativan injection 2 mg, IM, 3 times daily as needed, severe agitation     -- Encouraged patient to participate in unit milieu  and in scheduled group therapies  -- Short Term Goals: Ability to identify changes in lifestyle to reduce recurrence of condition will improve, Ability to verbalize feelings will improve, Ability to disclose and discuss suicidal ideas, Ability to demonstrate self-control will improve, Ability to identify and develop effective coping behaviors will improve, Ability to maintain clinical measurements within normal limits will improve, Compliance with prescribed medications will improve, and Ability to identify triggers associated with substance abuse/mental health issues will improve -- Long Term Goals: Improvement in symptoms so as ready for discharge                3. Medical Issues Being Addressed:               # Uncontrolled type 1 diabetes with hyperglycemia --Insulin being managed by Diabetes Coordinator --Current orders for Inpatient glycemic control:  Semglee 30 units daily Novolog 0-9 units, 3 times daily with meals Novolog 10 units, 3 times daily meal coverage NovoLog 0-5 units, daily at bedtime   # Vitamin D Deficiency -- Continue vitamin D 50,000 units, oral, every 7 days   #Tobacco Use Disorder             -- Declined Nicorette gum   -- Declined Nicotine patch              -- Smoking cessation encouraged   #Continue As Needed Medications --Tylenol 650 mg every 6 hours, as needed, mild pain, fever --Maalox/Mylanta 30 mL oral every 4 hours as needed, indigestion --Milk of  Magnesia 30 mL oral daily as needed, mild constipation   4. Labs    CMP: Unremarkable TSH : Unremarkable CBC: Na+ 126 (given 1000 mL bolus sodium chloride in the ED) Hemoglobin A1c: Elevated at 14.8 Lipid Panel: Cholesterol slightly elevated at 201 and LDL at 109           UDS: 07/15/7828 at 1208 + Marijuana; 01/28/2024 at 1603 negative  UA: Colorless urine, glucose >=500, Ketones 20   EKG: QT 378   QTc   428      5. Discharge Planning:              -- Social work and case management to assist with discharge planning and identification of hospital follow-up needs prior to discharge             -- Estimated LOS: 5-7 days             -- Discharge Concerns: Need to establish a safety plan; Medication compliance and effectiveness             -- Discharge Goals: Return home with outpatient referrals for mental health follow-up including medication management/psychotherapy      I certify that inpatient services furnished can reasonably be expected to improve the patient's condition.       Norma Fredrickson, NP 02/03/2024, 10:20 PM

## 2024-02-03 NOTE — Group Note (Signed)
 Date:  02/03/2024 Time:  12:46 PM  Group Topic/Focus:  Goals Group:   The focus of this group is to help patients establish daily goals to achieve during treatment and discuss how the patient can incorporate goal setting into their daily lives to aide in recovery.    Participation Level:  Minimal  Participation Quality:  Appropriate  Affect:  Appropriate   Erasmo Score 02/03/2024, 12:46 PM

## 2024-02-03 NOTE — Progress Notes (Signed)
   02/03/24 2000  Psych Admission Type (Psych Patients Only)  Admission Status Voluntary  Psychosocial Assessment  Patient Complaints None  Eye Contact Fair  Facial Expression Animated  Affect Appropriate to circumstance  Speech Logical/coherent  Interaction Assertive  Motor Activity Other (Comment) (WNL)  Appearance/Hygiene Disheveled  Behavior Characteristics Anxious  Mood Euthymic  Thought Process  Coherency Circumstantial  Content Blaming others  Delusions None reported or observed  Perception WDL  Hallucination None reported or observed  Judgment Poor  Confusion None  Danger to Self  Current suicidal ideation? Denies  Description of Suicide Plan None  Agreement Not to Harm Self Yes  Description of Agreement Verbal contract for safety  Danger to Others  Danger to Others None reported or observed

## 2024-02-03 NOTE — [facility] Group Notes (Signed)
 [facility] Group Notes:  (Nursing/MHT/Case Management/Adjunct)  Date:  02/03/2024  Time:  8:26 PM  Type of Therapy:   AA Group  Participation Level:  Did Not Attend  Participation Quality:    Affect:    Cognitive:    Insight:    Engagement in Group:    Modes of Intervention:    Summary of Progress/Problems: Didn't attend.   Jacob [name] 02/03/2024, 8:26 PM

## 2024-02-03 NOTE — Plan of Care (Signed)
   Problem: Education: Goal: Knowledge of [facility] Education information/materials will improve Outcome: Progressing Goal: Emotional status will improve Outcome: Progressing Goal: Mental status will improve Outcome: Progressing Goal: Verbalization of understanding the information provided will improve Outcome: Progressing

## 2024-02-03 NOTE — Inpatient Diabetes Management (Addendum)
 Inpatient Diabetes Program Recommendations  AACE/ADA: New Consensus Statement on Inpatient Glycemic Control (2015)  Target Ranges:  Prepandial:   less than 140 mg/dL      Peak postprandial:   less than 180 mg/dL (1-2 hours)      Critically ill patients:  140 - 180 mg/dL   Lab Results  Component Value Date   GLUCAP 316 (H) 02/03/2024   HGBA1C 14.8 (H) 01/28/2024    Review of Glycemic Control  Latest Reference Range & Units 02/02/24 05:48 02/02/24 11:49 02/02/24 16:47 02/02/24 20:36 02/03/24 05:48  Glucose-Capillary 70 - 99 mg/dL 914 (H) 782 (H) 956 (H) 238 (H) 316 (H)  (H): Data is abnormally high  Diabetes history: DM2 Outpatient Diabetes medications: Lantus 24 units every day, Humalog 10 units TID Current orders for Inpatient glycemic control: Semglee 30 units every day, Noovlog 0-9 units TID and 0-5 units at bedtime, Novolog 10 units TID  Inpatient Diabetes Program Recommendations:    Semglee 35 units every day (could give additional 5 units as he has already had his morning dose of 30 units) Novolog 14 units TID with meals if he consumes at least 50%  Will continue to follow while inpatient.  Thank you, Dulce Sellar, MSN, CDCES Diabetes Coordinator Inpatient Diabetes Program [phone] (team pager from 8a-5p)

## 2024-02-04 DIAGNOSIS — F250 Schizoaffective disorder, bipolar type: Secondary | ICD-10-CM | POA: Diagnosis not present

## 2024-02-04 LAB — GLUCOSE, CAPILLARY
Glucose-Capillary: 328 mg/dL — ABNORMAL HIGH (ref 70–99)
Glucose-Capillary: 227 mg/dL — ABNORMAL HIGH (ref 70–99)
Glucose-Capillary: 276 mg/dL — ABNORMAL HIGH (ref 70–99)
Glucose-Capillary: 148 mg/dL — ABNORMAL HIGH (ref 70–99)

## 2024-02-04 MED ORDER — ARIPIPRAZOLE 10 MG PO TABS
20.0000 mg | ORAL_TABLET | Freq: Every day | ORAL | Status: DC
  Administered 2024-02-05 – 2024-02-06 (×2): 20 mg via ORAL
  Filled 2024-02-04 (×4): qty 2

## 2024-02-04 MED ORDER — INSULIN ASPART 100 UNIT/ML IJ SOLN: 14.0000 [IU] | Freq: Three times a day (TID) | INTRAMUSCULAR | Status: DC

## 2024-02-04 MED ORDER — TRAZODONE HCL 50 MG PO TABS
50.0000 mg | ORAL_TABLET | Freq: Every day | ORAL | Status: DC
  Administered 2024-02-05 – 2024-02-08 (×5): 50 mg via ORAL
  Filled 2024-02-04 (×8): qty 1

## 2024-02-04 MED ORDER — INSULIN ASPART 100 UNIT/ML IJ SOLN
15.0000 [IU] | Freq: Three times a day (TID) | INTRAMUSCULAR | Status: DC
  Administered 2024-02-04 – 2024-02-09 (×15): 15 [IU] via SUBCUTANEOUS

## 2024-02-04 MED ORDER — INSULIN GLARGINE-YFGN 100 UNIT/ML ~~LOC~~ SOLN
35.0000 [IU] | Freq: Every day | SUBCUTANEOUS | Status: DC
  Administered 2024-02-05 – 2024-02-09 (×5): 35 [IU] via SUBCUTANEOUS

## 2024-02-04 NOTE — Group Note (Signed)
 LCSW Group Therapy Note  Group Date: 02/04/2024 Start Time: 1000 End Time: 1100   Type of Therapy and Topic:  Group Therapy: Positive Affirmations  Participation Level:  Did Not Attend   Description of Group:   This group addressed positive affirmation towards self and others.  Patients went around the room and identified two positive things about themselves and two positive things about a peer in the room.  Patients reflected on how it felt to share something positive with others, to identify positive things about themselves, and to hear positive things from others/ Patients were encouraged to have a daily reflection of positive characteristics or circumstances.   Therapeutic Goals: Patients will verbalize two of their positive qualities Patients will demonstrate empathy for others by stating two positive qualities about a peer in the group Patients will verbalize their feelings when voicing positive self affirmations and when voicing positive affirmations of others Patients will discuss the potential positive impact on their wellness/recovery of focusing on positive traits of self and others.  Summary of Patient Progress:  Pt was invited, did not attend  Therapeutic Modalities:   Cognitive Behavioral Therapy Motivational Interviewing    Steffanie Dunn, LCSWA 02/04/2024  5:50 PM
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
## 2024-02-04 NOTE — Progress Notes (Signed)
   02/04/24 0730  Psych Admission Type (Psych Patients Only)  Admission Status Voluntary  Psychosocial Assessment  Patient Complaints None  Eye Contact Fair  Facial Expression Animated  Affect Appropriate to circumstance  Speech Logical/coherent  Interaction Assertive  Motor Activity Other (Comment) (WNL)  Appearance/Hygiene Disheveled  Behavior Characteristics Anxious  Mood Euthymic  Thought Process  Coherency Circumstantial  Content WDL  Delusions None reported or observed  Perception WDL  Hallucination None reported or observed  Judgment Poor  Confusion None  Danger to Self  Current suicidal ideation? Denies  Agreement Not to Harm Self Yes  Description of Agreement verb  Danger to Others  Danger to Others None reported or observed

## 2024-02-04 NOTE — [facility] Group Notes (Signed)
 Adult Psychoeducational Group Note  Date:  02/04/2024 Time:  9:15 PM  Group Topic/Focus:  Wrap-Up Group:   The focus of this group is to help patients review their daily goal of treatment and discuss progress on daily workbooks.  Participation Level:  Active  Participation Quality:  Appropriate  Affect:  Appropriate  Cognitive:  Appropriate  Insight: Appropriate  Engagement in Group:  Engaged  Modes of Intervention:  Discussion  Additional Comments:  Pt attended group.  Joselyn Arrow 02/04/2024, 9:15 PM

## 2024-02-04 NOTE — Plan of Care (Signed)
   Problem: Education: Goal: Knowledge of [facility] Education information/materials will improve Outcome: Progressing Goal: Emotional status will improve Outcome: Progressing Goal: Mental status will improve Outcome: Progressing Goal: Verbalization of understanding the information provided will improve Outcome: Progressing

## 2024-02-04 NOTE — Group Note (Signed)
 Date:  02/04/2024 Time:  9:03 AM  Group Topic/Focus:  Goals Group:   The focus of this group is to help patients establish daily goals to achieve during treatment and discuss how the patient can incorporate goal setting into their daily lives to aide in recovery. Orientation:   The focus of this group is to educate the patient on the purpose and policies of crisis stabilization and provide a format to answer questions about their admission.  The group details unit policies and expectations of patients while admitted.    Participation Level:  Did Not Attend  Participation Quality:  n/a  Affect:  n/a  Cognitive:  n/a  Insight: None  Engagement in Group:  n/a  Modes of Intervention:  n/a  Additional Comments:  n/a  Dimas Chyle Sim [place] 02/04/2024, 9:03 AM

## 2024-02-05 DIAGNOSIS — F250 Schizoaffective disorder, bipolar type: Secondary | ICD-10-CM | POA: Diagnosis not present

## 2024-02-05 LAB — GLUCOSE, CAPILLARY
Glucose-Capillary: 256 mg/dL — ABNORMAL HIGH (ref 70–99)
Glucose-Capillary: 182 mg/dL — ABNORMAL HIGH (ref 70–99)
Glucose-Capillary: 192 mg/dL — ABNORMAL HIGH (ref 70–99)

## 2024-02-05 NOTE — Progress Notes (Signed)
 [facility] MD Progress Note  02/05/2024 2:38 PM Jacob [name]  MRN:  [id number] Principal Problem: Schizoaffective disorder, bipolar type (HCC) Diagnosis: Principal Problem:   Schizoaffective disorder, bipolar type (HCC) Active Problems:   Tobacco dependence   Uncontrolled type 1 diabetes mellitus with hyperglycemia, with long-term current use of insulin (HCC)   Vitamin D deficiency   History of Present Illness: Jacob [name], is a 33 year old male, with a significant psychiatric history of schizophrenia and medication noncompliance and medical history significant for poorly controlled type 1 diabetes.  On March 29, he presented to [facility] urgent care seeking housing and reported increased paranoia and auditory hallucinations.  He was subsequently transferred to [facility] emergency department due to hyperglycemia, with a recorded blood sugar level of 750.  After receiving medical clearance, he was admitted to [facility] on April 2 for further psychiatric care.   Chart review from last 24 hours: Chart reviewed today. Patient discussed at multidisciplinary team meeting. Vital signs within defined range. He is actively participating on the unit, and there are no current behavioral issues or concerns. He is compliant with routine psychotropic medication regimen without difficulty. Required as needed trazodone last night, according to the nursing record.     Daily Evaluation:  Patient seen and assessed in the room.  Denies SI/HI/AVH.  Reports eating and sleeping fairly well.  Although he appears coherent and linear at the beginning of assessment, he again became more disorganized as well as restless as assessment continued.  Patient tangentially discussed native americans but did not mention further paranoid statements like yesterday about being followed by [employer] or attempted assassinations. We again discussed getting him on a long-acting injectable but he refused at  this time stating that he would rather take PO.  We discussed his ability to take p.o. medications and he stated that he would attempt to do so. We discussed Uzedy as an alternative option as he cited arm/buttock pain for the IM injections, but still refused.  No EPS noted during assessment.   Total Time spent with patient: 45 minutes  Past Psychiatric History: See H&P  Past Medical History:  Past Medical History:  Diagnosis Date   Bipolar 1 disorder (HCC)    History of attempted suicide 2019   Unsuccessful suicide attempt in 2019 with rat poison   Schizoaffective disorder (HCC)    Type 1 diabetes mellitus on insulin therapy (HCC) 2019   Vitamin D deficiency 01/03/2024   History reviewed. No pertinent surgical history. Family History:  Family History  Problem Relation Age of Onset   Healthy Mother    Healthy Father    Family Psychiatric  History: See H&P Social History:  Social History   Substance and Sexual Activity  Alcohol Use Yes   Comment: social/occassional     Social History   Substance and Sexual Activity  Drug Use Not Currently   Types: Marijuana    Social History   Socioeconomic History   Marital status: Single    Spouse name: Not on file   Number of children: Not on file   Years of education: Not on file   Highest education level: Not on file  Occupational History   Not on file  Tobacco Use   Smoking status: Former    Types: Cigars, Cigarettes   Smokeless tobacco: Never   Tobacco comments:    2 BLACK AND MILD A DAY  Vaping Use   Vaping status: Every Day  Substance and Sexual Activity  Alcohol use: Yes    Comment: social/occassional   Drug use: Not Currently    Types: Marijuana   Sexual activity: Not on file  Other Topics Concern   Not on file  Social History Narrative   Not on file   Social Drivers of Health   Financial Resource Strain: Not on file  Food Insecurity: Food Insecurity Present (02/02/2024)   Hunger Vital Sign    Worried  About Running Out of Food in the Last Year: Sometimes true    Ran Out of Food in the Last Year: Sometimes true  Transportation Needs: Unmet Transportation Needs (02/02/2024)   PRAPARE - Administrator, Civil Service (Medical): Yes    Lack of Transportation (Non-Medical): No  Physical Activity: Not on file  Stress: Not on file  Social Connections: Socially Isolated (12/26/2023)   Social Connection and Isolation Panel [NHANES]    Frequency of Communication with Friends and Family: Never    Frequency of Social Gatherings with Friends and Family: Never    Attends Religious Services: Never    Database administrator or Organizations: No    Attends Engineer, structural: Never    Marital Status: Never married   Additional Social History:                         Current Medications: Current Facility-Administered Medications  Medication Dose Route Frequency Provider Last Rate Last Admin   acetaminophen (TYLENOL) tablet 650 mg  650 mg Oral Q6H PRN Lenox Ponds, NP   650 mg at 02/05/24 0154   alum & mag hydroxide-simeth (MAALOX/MYLANTA) 200-200-20 MG/5ML suspension 30 mL  30 mL Oral Q4H PRN Lenox Ponds, NP       ARIPiprazole (ABILIFY) tablet 20 mg  20 mg Oral Daily Park Pope, MD   20 mg at 02/05/24 9604   haloperidol (HALDOL) tablet 5 mg  5 mg Oral TID PRN Lenox Ponds, NP       And   diphenhydrAMINE (BENADRYL) capsule 50 mg  50 mg Oral TID PRN Lenox Ponds, NP       haloperidol lactate (HALDOL) injection 5 mg  5 mg Intramuscular TID PRN Lenox Ponds, NP       And   diphenhydrAMINE (BENADRYL) injection 50 mg  50 mg Intramuscular TID PRN Lenox Ponds, NP       And   LORazepam (ATIVAN) injection 2 mg  2 mg Intramuscular TID PRN Lenox Ponds, NP       haloperidol lactate (HALDOL) injection 10 mg  10 mg Intramuscular TID PRN Lenox Ponds, NP       And   diphenhydrAMINE (BENADRYL) injection 50 mg  50 mg Intramuscular TID PRN Lenox Ponds, NP       And   LORazepam (ATIVAN) injection 2 mg  2 mg Intramuscular TID PRN Lenox Ponds, NP       hydrOXYzine (ATARAX) tablet 25 mg  25 mg Oral TID PRN Bennett, Christal H, NP       insulin aspart (novoLOG) injection 0-5 Units  0-5 Units Subcutaneous QHS Lenox Ponds, NP   2 Units at 02/03/24 2122   insulin aspart (novoLOG) injection 0-9 Units  0-9 Units Subcutaneous TID WC Lenox Ponds, NP   2 Units at 02/05/24 1211   insulin aspart (novoLOG) injection 15 Units  15 Units Subcutaneous TID Hinton Lovely, MD   15  Units at 02/05/24 1210   insulin glargine-yfgn (SEMGLEE) injection 35 Units  35 Units Subcutaneous Daily Park Pope, MD   35 Units at 02/05/24 1610   magnesium hydroxide (MILK OF MAGNESIA) suspension 30 mL  30 mL Oral Daily PRN Lenox Ponds, NP       traZODone (DESYREL) tablet 50 mg  50 mg Oral Seabron Spates, MD   50 mg at 02/05/24 0152   [START ON 02/06/2024] Vitamin D (Ergocalciferol) (DRISDOL) 1.25 MG (50000 UNIT) capsule 50,000 Units  50,000 Units Oral Q7 days Lenox Ponds, NP        Lab Results:  Results for orders placed or performed during the hospital encounter of 02/02/24 (from the past 48 hours)  Glucose, capillary     Status: Abnormal   Collection Time: 02/03/24  5:15 PM  Result Value Ref Range   Glucose-Capillary 249 (H) 70 - 99 mg/dL    Comment: Glucose reference range applies only to samples taken after fasting for at least 8 hours.  Glucose, capillary     Status: Abnormal   Collection Time: 02/03/24  7:56 PM  Result Value Ref Range   Glucose-Capillary 208 (H) 70 - 99 mg/dL    Comment: Glucose reference range applies only to samples taken after fasting for at least 8 hours.  Glucose, capillary     Status: Abnormal   Collection Time: 02/04/24  6:31 AM  Result Value Ref Range   Glucose-Capillary 328 (H) 70 - 99 mg/dL    Comment: Glucose reference range applies only to samples taken after fasting for at least 8 hours.   Comment 1 Notify RN    Glucose, capillary     Status: Abnormal   Collection Time: 02/04/24 11:57 AM  Result Value Ref Range   Glucose-Capillary 227 (H) 70 - 99 mg/dL    Comment: Glucose reference range applies only to samples taken after fasting for at least 8 hours.  Glucose, capillary     Status: Abnormal   Collection Time: 02/04/24  5:15 PM  Result Value Ref Range   Glucose-Capillary 276 (H) 70 - 99 mg/dL    Comment: Glucose reference range applies only to samples taken after fasting for at least 8 hours.  Glucose, capillary     Status: Abnormal   Collection Time: 02/04/24  8:59 PM  Result Value Ref Range   Glucose-Capillary 148 (H) 70 - 99 mg/dL    Comment: Glucose reference range applies only to samples taken after fasting for at least 8 hours.  Glucose, capillary     Status: Abnormal   Collection Time: 02/05/24  6:09 AM  Result Value Ref Range   Glucose-Capillary 256 (H) 70 - 99 mg/dL    Comment: Glucose reference range applies only to samples taken after fasting for at least 8 hours.  Glucose, capillary     Status: Abnormal   Collection Time: 02/05/24 11:52 AM  Result Value Ref Range   Glucose-Capillary 182 (H) 70 - 99 mg/dL    Comment: Glucose reference range applies only to samples taken after fasting for at least 8 hours.    Blood Alcohol level:  Lab Results  Component Value Date   [facility] <10 01/28/2024   ETH <10 01/28/2024    Metabolic Disorder Labs: Lab Results  Component Value Date   HGBA1C 14.8 (H) 01/28/2024   MPG 378.06 01/28/2024   MPG 280.48 10/23/2023   Lab Results  Component Value Date   PROLACTIN 18.7 12/27/2023   Lab Results  Component Value Date   CHOL 201 (H) 01/28/2024   TRIG 85 01/28/2024   HDL 75 01/28/2024   CHOLHDL 2.7 01/28/2024   VLDL 17 01/28/2024   LDLCALC 109 (H) 01/28/2024   LDLCALC 77 12/27/2023    Physical Findings: AIMS:  , ,  ,0  ,    CIWA:   N/A COWS:   N/A  Musculoskeletal: Strength & Muscle Tone: within normal limits Gait & Station:  normal Patient leans: N/A  Psychiatric Specialty Exam:  Presentation  General Appearance:  Casual; Disheveled  Eye Contact: Fair  Speech: Pressured  Speech Volume: Increased  Handedness: Right   Mood and Affect  Mood: Anxious; Euphoric  Affect: Labile   Thought Process  Thought Processes: Disorganized  Descriptions of Associations:Tangential  Orientation:Partial  Thought Content:Paranoid Ideation; Delusions; Tangential  History of Schizophrenia/Schizoaffective disorder:Yes  Duration of Psychotic Symptoms:Greater than six months  Hallucinations:Hallucinations: None   Ideas of Reference:Delusions; Percusatory; Paranoia  Suicidal Thoughts:Suicidal Thoughts: No   Homicidal Thoughts:Homicidal Thoughts: No    Sensorium  Memory: Immediate Fair  Judgment: Impaired  Insight: Poor   Executive Functions  Concentration: Fair  Attention Span: Fair  Recall: Fair  Fund of Knowledge: Fair  Language: Fair   Psychomotor Activity  Psychomotor Activity: Psychomotor Activity: Restlessness    Assets  Assets: Manufacturing systems engineer; Desire for Improvement   Sleep  Sleep: Sleep: Fair     Physical Exam: Physical Exam Vitals and nursing note reviewed.  Constitutional:      General: He is not in acute distress.    Appearance: He is normal weight. He is not ill-appearing.  Cardiovascular:     Rate and Rhythm: Normal rate.     Pulses: Normal pulses.  Neurological:     Mental Status: He is alert and oriented to person, place, and time.    Review of Systems  Constitutional: Negative.   Respiratory:  Negative for shortness of breath.   Cardiovascular:  Negative for chest pain and palpitations.  Gastrointestinal:  Negative for constipation, diarrhea, nausea and vomiting.  Musculoskeletal:  Negative for falls.  Skin: Negative.   Neurological:  Negative for dizziness, tingling, tremors and headaches.  Psychiatric/Behavioral:   Positive for depression, hallucinations and substance abuse. Negative for suicidal ideas. The patient is nervous/anxious and has insomnia.    Blood pressure (!) 121/96, pulse 76, temperature 98 F (36.7 C), temperature source Oral, resp. rate 14, height 5\' 9"  (1.753 m), weight 55.3 kg, SpO2 99%. Body mass index is 18.02 kg/m.   Treatment Plan Summary: Daily contact with patient to assess and evaluate symptoms and progress in treatment and Medication management  ASSESSMENT: 33 year old male patient presents with a complex psychiatric history, including schizoaffective disorder, bipolar type, and a history of medication noncompliance. He reports symptoms consistent with persecutory and paranoid delusions, as well as depressive symptoms such as hopelessness, worthlessness, and difficulty concentrating. Additionally, he endorses manic symptoms, including extreme high energy and euphoric feelings. His medical history includes uncontrolled type 1 diabetes mellitus, which may contribute to his overall health challenges. He denies current substance use but has a history of marijuana use one month ago and nicotine use one week ago.  Patient remains delusional and paranoid although reports generally feeling safe on the unit.  I anticipate component of his psychosis is secondary to medication noncompliance.  Continue Abilify 20 mg to aid with psychosis.  Ultimately, he would most strongly benefit from being on long-acting injectable to maintain psychiatric stability so we will continue to discuss this  with patient daily.  His glucose remains very elevated so we will continue to adjust insulin to address diabetes. Also reordering BMP given likely pseudohyponatremia. A1c 14.8 so clearly noncompliant with insulin regiment outside.   ACT team to see tomorrow to evaluate for appropriateness of patient to services.   Diagnoses / Active Problems: Principal Problem:   Schizoaffective disorder, bipolar type  (HCC) Active Problems:   Uncontrolled type 1 diabetes mellitus with hyperglycemia, with long-term current use of insulin (HCC)              PLAN: Safety and Monitoring:             --  Voluntary admission to inpatient psychiatric unit for safety, stabilization and treatment             -- Daily contact with patient to assess and evaluate symptoms and progress in treatment             -- Patient's case to be discussed in multi-disciplinary team meeting             -- Observation Level : q15 minute checks             -- Vital signs:  q12 hours             -- Precautions: suicide, elopement, and assault   2. Psychiatric Diagnoses and Treatment:    # Schizoaffective disorder, bipolar type -- Discontinued Risperdal 1 mg, 2 times daily on admission -- Continue Abilify 20 mg, daily      --  The risks/benefits/side-effects/alternatives to this medication were discussed in detail with the patient and time was given for questions. The patient consents to medication trial.  -- FDA -- Metabolic profile and EKG monitoring obtained while on an atypical antipsychotic (BMI: Lipid Panel: HbgA1c: QTc:)    #Continue As Needed Medications --Hydroxyzine 25 mg oral, 3 times daily as needed, anxiety --Trazodone 50 mg, oral, daily at bedtime as needed, sleep   #Continue BH Agitation Protocol --Haldol 5 mg, oral, 3 times daily as needed, mild agitation --Benadryl 50 mg, oral, 3 times daily as needed, mild agitation                                     OR  --Haldol injection 5 mg, IM, 3 times daily as needed, moderate agitation --Benadryl injection 50 mg, IM, 3 times daily as needed, moderate agitation --Ativan injection 2 mg, IM, 3 times daily as needed, moderate agitation                                     OR --Haldol injection 10 mg, IM, 3 times daily as needed, severe agitation --Benadryl injection 50 mg, IM, 3 times daily as needed, severe agitation --Ativan injection 2 mg, IM, 3 times daily as  needed, severe agitation     -- Encouraged patient to participate in unit milieu and in scheduled group therapies  -- Short Term Goals: Ability to identify changes in lifestyle to reduce recurrence of condition will improve, Ability to verbalize feelings will improve, Ability to disclose and discuss suicidal ideas, Ability to demonstrate self-control will improve, Ability to identify and develop effective coping behaviors will improve, Ability to maintain clinical measurements within normal limits will improve, Compliance with prescribed medications will improve, and Ability to identify triggers associated with  substance abuse/mental health issues will improve -- Long Term Goals: Improvement in symptoms so as ready for discharge                3. Medical Issues Being Addressed:               # Uncontrolled type 1 diabetes with hyperglycemia --Referral to Diabetes Coordinator --Current orders for Inpatient glycemic control (goal CBG 140-180) Continue Semglee to 35 units daily Continue insulin aspart to 15 mg tid for mealtime converage Novolog 0-9 units, 3 times daily with meals NovoLog 0-5 units, daily at bedtime   # Vitamin D Deficiency -- Continue vitamin D 50,000 units, oral, every 7 days   #Tobacco Use Disorder             -- Declined Nicorette gum   -- Declined Nicotine patch              -- Smoking cessation encouraged   #Continue As Needed Medications --Tylenol 650 mg every 6 hours, as needed, mild pain, fever --Maalox/Mylanta 30 mL oral every 4 hours as needed, indigestion --Milk of Magnesia 30 mL oral daily as needed, mild constipation   4. Labs    CMP: Unremarkable TSH : Unremarkable CBC: Na+ 126 (given 1000 mL bolus sodium chloride in the ED) Hemoglobin A1c: Elevated at 14.8 Lipid Panel: Cholesterol slightly elevated at 201 and LDL at 109           UDS: 1/61/0960 at 1208 + Marijuana; 01/28/2024 at 1603 negative  UA: Colorless urine, glucose >=500, Ketones 20   EKG:  QTc   428      5. Discharge Planning:              -- Social work and case management to assist with discharge planning and identification of hospital follow-up needs prior to discharge             -- Estimated LOS: 5-7 days             -- Discharge Concerns: Need to establish a safety plan; Medication compliance and effectiveness             -- Discharge Goals: Return home with outpatient referrals for mental health follow-up including medication management/psychotherapy      I certify that inpatient services furnished can reasonably be expected to improve the patient's condition.       Park Pope, MD 02/05/2024, 2:38 PM

## 2024-02-05 NOTE — Group Note (Signed)
 Date:  02/05/2024 Time:  11:20 PM  Group Topic/Focus:  Wrap-Up Group:   The focus of this group is to help patients review their daily goal of treatment and discuss progress on daily workbooks.    Participation Level:  Active  Participation Quality:  Appropriate, Attentive, Sharing, and Supportive  Affect:  Appropriate  Cognitive:  Alert and Appropriate  Insight: Appropriate  Engagement in Group:  Engaged  Modes of Intervention:  Discussion  Additional Comments:  pt was attentive and appropriate during tonight's wrap up group. Pt shared that he was able to work on having good communication and body language. Pt stated that he was able to achieve goal. Overall good day.   Bing Plume D 02/05/2024, 11:20 PM

## 2024-02-05 NOTE — Plan of Care (Signed)
 Pt presents with animated expression and pleasant mood. Denies SI, HI, and AVH. Pt complains of headache 7/10 and was given prn tylenol. Pt was observed in the milieu and the dayroom attending evening group. Cooperative and assertive in interactions with staff. Medication compliant with no adverse reactions. Safety checks maintained at q 15 minutes. Support, encouragement, and reassurance offered to the pt.   Problem: Education: Goal: Emotional status will improve Outcome: Progressing Goal: Mental status will improve Outcome: Progressing Goal: Verbalization of understanding the information provided will improve Outcome: Progressing   Problem: Activity: Goal: Interest or engagement in activities will improve Outcome: Progressing   Problem: Safety: Goal: Periods of time without injury will increase Outcome: Progressing

## 2024-02-05 NOTE — Progress Notes (Signed)
 D: Patient is alert, oriented, pleasant, and cooperative. Denies SI, HI, AVH, and verbally contracts for safety. Patient reports he slept good last night with sleeping medication. Patient reports his appetite as good, energy level as normal/high, and concentration as good. Patient rates his depression 0/10, hopelessness 0/10, and anxiety 0/10. Patient denies physical symptoms/pain.    A: Scheduled medications administered per MD order. Support provided. Patient educated on safety on the unit and medications. Routine safety checks every 15 minutes. Patient stated understanding to tell nurse about any new physical symptoms. Patient understands to tell staff of any needs.     R: No adverse drug reactions noted. Patient remains safe at this time and will continue to monitor.    02/05/24 1000  Psych Admission Type (Psych Patients Only)  Admission Status Voluntary  Psychosocial Assessment  Patient Complaints None  Eye Contact Fair  Facial Expression Animated  Affect Appropriate to circumstance  Speech Logical/coherent  Interaction Assertive  Motor Activity Other (Comment) (WNL)  Appearance/Hygiene Disheveled  Behavior Characteristics Fidgety;Restless  Mood Pleasant  Thought Process  Coherency Circumstantial  Content WDL  Delusions None reported or observed  Perception WDL  Hallucination None reported or observed  Judgment Poor  Confusion None  Danger to Self  Current suicidal ideation? Denies  Agreement Not to Harm Self Yes  Description of Agreement verbal  Danger to Others  Danger to Others None reported or observed

## 2024-02-05 NOTE — Plan of Care (Signed)

## 2024-02-05 NOTE — Group Note (Signed)
 Date:  02/05/2024 Time:  9:15 AM  Group Topic/Focus:  Goals Group:   The focus of this group is to help patients establish daily goals to achieve during treatment and discuss how the patient can incorporate goal setting into their daily lives to aide in recovery. Orientation:   The focus of this group is to educate the patient on the purpose and policies of crisis stabilization and provide a format to answer questions about their admission.  The group details unit policies and expectations of patients while admitted.    Participation Level:  Did Not Attend  Participation Quality:  n/a  Affect:  n/a  Cognitive:  n/a  Insight: None  Engagement in Group:  n/a  Modes of Intervention:  n/a  Additional Comments:  n/a  Jacob [name] 02/05/2024, 9:15 AM

## 2024-02-05 NOTE — [facility] Suicide Risk Assessment (Signed)
 [facility] INPATIENT:  Family/Significant Other Suicide Prevention Education  Suicide Prevention Education:  Education Completed; mother, [name] [phone] has been identified by the patient as the family member/significant other with whom the patient will be residing, and identified as the person(s) who will aid the patient in the event of a mental health crisis (suicidal ideations/suicide attempt).  With written consent from the patient, the family member/significant other has been provided the following suicide prevention education, prior to the and/or following the discharge of the patient.  The suicide prevention education provided includes the following: Suicide risk factors Suicide prevention and interventions National Suicide Hotline telephone number [facility] assessment telephone number [facility] Emergency Assistance 911 [facility] and/or Residential Mobile Crisis Unit telephone number  Request made of family/significant other to: Remove weapons (e.g., guns, rifles, knives), all items previously/currently identified as safety concern.   Remove drugs/medications (over-the-counter, prescriptions, illicit drugs), all items previously/currently identified as a safety concern.  The family member/significant other verbalizes understanding of the suicide prevention education information provided.  The family member/significant other agrees to remove the items of safety concern listed above.  Jacob [name] O Jacob [name] 02/05/2024, 5:17 PM

## 2024-02-05 NOTE — Group Note (Signed)
 Date:  02/05/2024 Time:  9:55 AM  Group Topic/Focus:  The topic of this group is mindful breathing    Participation Level:  Active  Participation Quality:  Appropriate

## 2024-02-06 DIAGNOSIS — F250 Schizoaffective disorder, bipolar type: Secondary | ICD-10-CM | POA: Diagnosis not present

## 2024-02-06 LAB — GLUCOSE, CAPILLARY
Glucose-Capillary: 137 mg/dL — ABNORMAL HIGH (ref 70–99)
Glucose-Capillary: 233 mg/dL — ABNORMAL HIGH (ref 70–99)
Glucose-Capillary: 77 mg/dL (ref 70–99)
Glucose-Capillary: 135 mg/dL — ABNORMAL HIGH (ref 70–99)
Glucose-Capillary: 230 mg/dL — ABNORMAL HIGH (ref 70–99)
Glucose-Capillary: 125 mg/dL — ABNORMAL HIGH (ref 70–99)

## 2024-02-06 LAB — BASIC METABOLIC PANEL WITH GFR
Anion gap: 7 (ref 5–15)
BUN: 11 mg/dL (ref 6–20)
CO2: 27 mmol/L (ref 22–32)
Calcium: 9 mg/dL (ref 8.9–10.3)
Chloride: 103 mmol/L (ref 98–111)
Creatinine, Ser: 0.49 mg/dL — ABNORMAL LOW (ref 0.61–1.24)
GFR, Estimated: >60 mL/min (ref >60)
Glucose, Bld: 218 mg/dL — ABNORMAL HIGH (ref 70–99)
Potassium: 4 mmol/L (ref 3.5–5.1)
Sodium: 137 mmol/L (ref 135–145)

## 2024-02-06 MED ORDER — HALOPERIDOL 5 MG PO TABS
5.0000 mg | ORAL_TABLET | Freq: Two times a day (BID) | ORAL | Status: DC
  Administered 2024-02-06 – 2024-02-09 (×6): 5 mg via ORAL
  Filled 2024-02-06 (×11): qty 1

## 2024-02-06 MED ORDER — BENZTROPINE MESYLATE 0.5 MG PO TABS
0.5000 mg | ORAL_TABLET | Freq: Two times a day (BID) | ORAL | Status: DC
  Administered 2024-02-06 – 2024-02-09 (×6): 0.5 mg via ORAL
  Filled 2024-02-06 (×11): qty 1

## 2024-02-06 NOTE — [facility] Group Notes (Signed)
 [facility] Group Notes:  (Nursing/MHT/Case Management/Adjunct)  Date:  02/06/2024  Time:  9:35 PM  Type of Therapy:  Psychoeducational Skills  Participation Level:  None  Participation Quality:  Inattentive  Affect:  Flat  Cognitive:  Lacking  Insight:  None  Engagement in Group:  None  Modes of Intervention:  Education  Summary of Progress/Problems: The patient attended the evening A.A. speaker's meeting but did not share.   Hazle Coca S 02/06/2024, 9:35 PM

## 2024-02-06 NOTE — Progress Notes (Addendum)
 Patient's CBG = 77; taken at 11:45am prior to lunch. Scheduled insulin held per [facility] orders. Patient provided with a granola bar and encouraged to eat. Pt verbalized understanding. Pt is laying in bed at this time with no s/s of distress present. Pt's blood sugar will be rechecked following lunch.

## 2024-02-06 NOTE — Progress Notes (Signed)
 [facility] MD Progress Note  02/06/2024 6:40 PM Jacob [name]  MRN:  [id number] Principal Problem: Schizoaffective disorder, bipolar type (HCC) Diagnosis: Principal Problem:   Schizoaffective disorder, bipolar type (HCC) Active Problems:   Tobacco dependence   Uncontrolled type 1 diabetes mellitus with hyperglycemia, with long-term current use of insulin (HCC)   Vitamin D deficiency   History of Present Illness: Jacob [name], is a 33 year old male, with a significant psychiatric history of schizophrenia and medication noncompliance and medical history significant for poorly controlled type 1 diabetes.  On March 29, he presented to [facility] urgent care seeking housing and reported increased paranoia and auditory hallucinations.  He was subsequently transferred to [facility] emergency department due to hyperglycemia, with a recorded blood sugar level of 750.  After receiving medical clearance, he was admitted to [facility] on April 2 for further psychiatric care.   Chart review from last 24 hours: Chart reviewed today. Patient discussed at multidisciplinary team meeting. Vital signs within defined range. He is continuing to be actively participating on the unit, and there are no current behavioral issues or concerns. He is compliant with routine psychotropic medication regimen without difficulty. Required only Tylenol in the past 24 hrs as a PRN med.   Daily Evaluation:  On assessment today, the pt reports that their mood is "good", but as per objective assessment, he is disorganized, and psychosis is persistent; Patient is grandiose; talks about officials from [employer] coming to represent him in a court case where his grandmother placed a 50 B on him, talked about owning stocks in the stock market that he purchased when he was 33 yrs old. Reports that anxiety is less than at time of hospitalization. Talked about sleeping until 3am and waking up at that time to drink  water, but then perseverated about "millionaires" needing to wake up early. Denies AVH, but talked about seeing and hearing the officials from the [employer] at his court case and how they were dressed in suits and how they had on cuff links on their shirts and pendants, talked about the stock market waking him up, talked about being rich, etc.  Sleep is poor as per pt's reports. Appetite is good. Concentration is fair.  Energy level is moderate. Denies suicidal thoughts. Denies suicidal intent and plan.  Denies having any HI.  Denies having side effects to current psychiatric medications.   We discussed changes to current medication regimen, including stopping Abilify since it is now at 20 mg daily and psychosis remains persistent. We will discontinue this medication at this time, and start Haldol 5 mg BID, and Cogentin 0.5 mg BID for EPS prophylaxis, and will plan for an LAI prior to discharge due to history of non compliance. We are requiring for patient to remain hospitalized at this time as we are continuing to make adjustments to medications in medications in an effort to treat psychosis. We are requiring to monitor for potential side effects with this change in medication, and need a few more days of hospitalization.  Discussed the following psychosocial stressors: Homelessness. Pt states that his whole family is under investigation for attempted murder, and begins listing people. This is most likely part of his psychosis. We will revisit discharge planning as psychosis begins to resolve.     Labs reviewed: Repeat Ha1c ordered. Last one was 14, unsure if this was fasting.   Total Time spent with patient: 45 minutes  Past Psychiatric History: See H&P  Past Medical History:  Past Medical History:  Diagnosis Date   Bipolar 1 disorder (HCC)    History of attempted suicide 2019   Unsuccessful suicide attempt in 2019 with rat poison   Schizoaffective disorder (HCC)    Type 1 diabetes  mellitus on insulin therapy (HCC) 2019   Vitamin D deficiency 01/03/2024   History reviewed. No pertinent surgical history. Family History:  Family History  Problem Relation Age of Onset   Healthy Mother    Healthy Father    Family Psychiatric  History: See H&P Social History:  Social History   Substance and Sexual Activity  Alcohol Use Yes   Comment: social/occassional     Social History   Substance and Sexual Activity  Drug Use Not Currently   Types: Marijuana    Social History   Socioeconomic History   Marital status: Single    Spouse name: Not on file   Number of children: Not on file   Years of education: Not on file   Highest education level: Not on file  Occupational History   Not on file  Tobacco Use   Smoking status: Former    Types: Cigars, Cigarettes   Smokeless tobacco: Never   Tobacco comments:    2 BLACK AND MILD A DAY  Vaping Use   Vaping status: Every Day  Substance and Sexual Activity   Alcohol use: Yes    Comment: social/occassional   Drug use: Not Currently    Types: Marijuana   Sexual activity: Not on file  Other Topics Concern   Not on file  Social History Narrative   Not on file   Social Drivers of Health   Financial Resource Strain: Not on file  Food Insecurity: Food Insecurity Present (02/02/2024)   Hunger Vital Sign    Worried About Running Out of Food in the Last Year: Sometimes true    Ran Out of Food in the Last Year: Sometimes true  Transportation Needs: Unmet Transportation Needs (02/02/2024)   PRAPARE - Administrator, Civil Service (Medical): Yes    Lack of Transportation (Non-Medical): No  Physical Activity: Not on file  Stress: Not on file  Social Connections: Socially Isolated (12/26/2023)   Social Connection and Isolation Panel [NHANES]    Frequency of Communication with Friends and Family: Never    Frequency of Social Gatherings with Friends and Family: Never    Attends Religious Services: Never    Automotive engineer or Organizations: No    Attends Engineer, structural: Never    Marital Status: Never married  Current Medications: Current Facility-Administered Medications  Medication Dose Route Frequency Provider Last Rate Last Admin   acetaminophen (TYLENOL) tablet 650 mg  650 mg Oral Q6H PRN Lenox Ponds, NP   650 mg at 02/06/24 1714   alum & mag hydroxide-simeth (MAALOX/MYLANTA) 200-200-20 MG/5ML suspension 30 mL  30 mL Oral Q4H PRN Lenox Ponds, NP       benztropine (COGENTIN) tablet 0.5 mg  0.5 mg Oral BID Starleen Blue, NP       haloperidol (HALDOL) tablet 5 mg  5 mg Oral TID PRN Lenox Ponds, NP       And   diphenhydrAMINE (BENADRYL) capsule 50 mg  50 mg Oral TID PRN Lenox Ponds, NP       haloperidol lactate (HALDOL) injection 5 mg  5 mg Intramuscular TID PRN Lenox Ponds, NP       And   diphenhydrAMINE (BENADRYL)  injection 50 mg  50 mg Intramuscular TID PRN Lenox Ponds, NP       And   LORazepam (ATIVAN) injection 2 mg  2 mg Intramuscular TID PRN Lenox Ponds, NP       haloperidol lactate (HALDOL) injection 10 mg  10 mg Intramuscular TID PRN Lenox Ponds, NP       And   diphenhydrAMINE (BENADRYL) injection 50 mg  50 mg Intramuscular TID PRN Lenox Ponds, NP       And   LORazepam (ATIVAN) injection 2 mg  2 mg Intramuscular TID PRN Lenox Ponds, NP       haloperidol (HALDOL) tablet 5 mg  5 mg Oral BID Starleen Blue, NP       hydrOXYzine (ATARAX) tablet 25 mg  25 mg Oral TID PRN Bennett, Christal H, NP       insulin aspart (novoLOG) injection 0-5 Units  0-5 Units Subcutaneous QHS Lenox Ponds, NP   2 Units at 02/03/24 2122   insulin aspart (novoLOG) injection 0-9 Units  0-9 Units Subcutaneous TID WC Lenox Ponds, NP   3 Units at 02/06/24 1712   insulin aspart (novoLOG) injection 15 Units  15 Units Subcutaneous TID Hinton Lovely, MD   15 Units at 02/06/24 1713   insulin glargine-yfgn (SEMGLEE) injection 35 Units  35 Units  Subcutaneous Daily Park Pope, MD   35 Units at 02/06/24 0743   magnesium hydroxide (MILK OF MAGNESIA) suspension 30 mL  30 mL Oral Daily PRN Lenox Ponds, NP       traZODone (DESYREL) tablet 50 mg  50 mg Oral Seabron Spates, MD   50 mg at 02/05/24 2108   Vitamin D (Ergocalciferol) (DRISDOL) 1.25 MG (50000 UNIT) capsule 50,000 Units  50,000 Units Oral Q7 days Lenox Ponds, NP   50,000 Units at 02/06/24 4098    Lab Results:  Results for orders placed or performed during the hospital encounter of 02/02/24 (from the past 48 hours)  Glucose, capillary     Status: Abnormal   Collection Time: 02/04/24  8:59 PM  Result Value Ref Range   Glucose-Capillary 148 (H) 70 - 99 mg/dL    Comment: Glucose reference range applies only to samples taken after fasting for at least 8 hours.  Glucose, capillary     Status: Abnormal   Collection Time: 02/05/24  6:09 AM  Result Value Ref Range   Glucose-Capillary 256 (H) 70 - 99 mg/dL    Comment: Glucose reference range applies only to samples taken after fasting for at least 8 hours.  Glucose, capillary     Status: Abnormal   Collection Time: 02/05/24 11:52 AM  Result Value Ref Range   Glucose-Capillary 182 (H) 70 - 99 mg/dL    Comment: Glucose reference range applies only to samples taken after fasting for at least 8 hours.  Glucose, capillary     Status: Abnormal   Collection Time: 02/05/24  5:07 PM  Result Value Ref Range   Glucose-Capillary 192 (H) 70 - 99 mg/dL    Comment: Glucose reference range applies only to samples taken after fasting for at least 8 hours.  Glucose, capillary     Status: Abnormal   Collection Time: 02/05/24  8:39 PM  Result Value Ref Range   Glucose-Capillary 137 (H) 70 - 99 mg/dL    Comment: Glucose reference range applies only to samples taken after fasting for at least 8 hours.   Comment 1  Notify RN    Comment 2 Document in Chart   Glucose, capillary     Status: Abnormal   Collection Time: 02/06/24  5:51 AM  Result  Value Ref Range   Glucose-Capillary 233 (H) 70 - 99 mg/dL    Comment: Glucose reference range applies only to samples taken after fasting for at least 8 hours.   Comment 1 Notify RN    Comment 2 Document in Chart   Basic metabolic panel     Status: Abnormal   Collection Time: 02/06/24  6:21 AM  Result Value Ref Range   Sodium 137 135 - 145 mmol/L   Potassium 4.0 3.5 - 5.1 mmol/L   Chloride 103 98 - 111 mmol/L   CO2 27 22 - 32 mmol/L   Glucose, Bld 218 (H) 70 - 99 mg/dL    Comment: Glucose reference range applies only to samples taken after fasting for at least 8 hours.   BUN 11 6 - 20 mg/dL   Creatinine, Ser 6.29 (L) 0.61 - 1.24 mg/dL   Calcium 9.0 8.9 - 52.8 mg/dL   GFR, Estimated >41 >32 mL/min    Comment: (NOTE) Calculated using the CKD-EPI Creatinine Equation (2021)    Anion gap 7 5 - 15    Comment: Performed at [facility], [address], [place], [place] [address]  Glucose, capillary     Status: None   Collection Time: 02/06/24 11:44 AM  Result Value Ref Range   Glucose-Capillary 77 70 - 99 mg/dL    Comment: Glucose reference range applies only to samples taken after fasting for at least 8 hours.  Glucose, capillary     Status: Abnormal   Collection Time: 02/06/24 12:37 PM  Result Value Ref Range   Glucose-Capillary 135 (H) 70 - 99 mg/dL    Comment: Glucose reference range applies only to samples taken after fasting for at least 8 hours.  Glucose, capillary     Status: Abnormal   Collection Time: 02/06/24  5:06 PM  Result Value Ref Range   Glucose-Capillary 230 (H) 70 - 99 mg/dL    Comment: Glucose reference range applies only to samples taken after fasting for at least 8 hours.    Blood Alcohol level:  Lab Results  Component Value Date   ETH <10 01/28/2024   ETH <10 01/28/2024    Metabolic Disorder Labs: Lab Results  Component Value Date   HGBA1C 14.8 (H) 01/28/2024   MPG 378.06 01/28/2024   MPG 280.48 10/23/2023   Lab Results   Component Value Date   PROLACTIN 18.7 12/27/2023   Lab Results  Component Value Date   CHOL 201 (H) 01/28/2024   TRIG 85 01/28/2024   HDL 75 01/28/2024   CHOLHDL 2.7 01/28/2024   VLDL 17 01/28/2024   LDLCALC 109 (H) 01/28/2024   LDLCALC 77 12/27/2023    Physical Findings: AIMS:  , ,  ,0  ,    CIWA:   N/A COWS:   N/A  Musculoskeletal: Strength & Muscle Tone: within normal limits Gait & Station: normal Patient leans: N/A  Psychiatric Specialty Exam:  Presentation  General Appearance:  Disheveled  Eye Contact: Fair  Speech: Clear and Coherent  Speech Volume: Decreased  Handedness: Right   Mood and Affect  Mood: Anxious  Affect: Congruent   Thought Process  Thought Processes: Disorganized  Descriptions of Associations:Circumstantial  Orientation:Partial  Thought Content:Illogical  History of Schizophrenia/Schizoaffective disorder:Yes  Duration of Psychotic Symptoms:Greater than six months  Hallucinations:Hallucinations: None  Ideas of Reference:Delusions  Suicidal Thoughts:Suicidal Thoughts: No    Homicidal Thoughts:Homicidal Thoughts: No     Sensorium  Memory: Recent Poor  Judgment: Poor  Insight: Poor   Executive Functions  Concentration: Fair  Attention Span: Fair  Recall: Fair  Fund of Knowledge: Fair  Language: Fair   Psychomotor Activity  Psychomotor Activity: Psychomotor Activity: Normal     Assets  Assets: Resilience   Sleep  Sleep: Sleep: Poor      Physical Exam: Physical Exam Vitals and nursing note reviewed.  Constitutional:      General: He is not in acute distress.    Appearance: He is normal weight. He is not ill-appearing.  Cardiovascular:     Rate and Rhythm: Normal rate.     Pulses: Normal pulses.  Neurological:     Mental Status: He is alert and oriented to person, place, and time.    Review of Systems  Constitutional: Negative.   Respiratory:  Negative for  shortness of breath.   Cardiovascular:  Negative for chest pain and palpitations.  Gastrointestinal:  Negative for constipation, diarrhea, nausea and vomiting.  Musculoskeletal:  Negative for falls.  Skin: Negative.   Neurological:  Negative for dizziness, tingling, tremors and headaches.  Psychiatric/Behavioral:  Positive for depression, hallucinations and substance abuse. Negative for memory loss and suicidal ideas. The patient is nervous/anxious and has insomnia.    Blood pressure 121/74, pulse 88, temperature 98.2 F (36.8 C), temperature source Oral, resp. rate 16, height 5\' 9"  (1.753 m), weight 55.3 kg, SpO2 100%. Body mass index is 18.02 kg/m.   Treatment Plan Summary: Daily contact with patient to assess and evaluate symptoms and progress in treatment and Medication management  ACT team to see tomorrow to evaluate for appropriateness of patient to services.   Diagnoses / Active Problems: Principal Problem:   Schizoaffective disorder, bipolar type (HCC) Active Problems:   Uncontrolled type 1 diabetes mellitus with hyperglycemia, with long-term current use of insulin (HCC)              PLAN: Safety and Monitoring:             --  Voluntary admission to inpatient psychiatric unit for safety, stabilization and treatment             -- Daily contact with patient to assess and evaluate symptoms and progress in treatment             -- Patient's case to be discussed in multi-disciplinary team meeting             -- Observation Level : q15 minute checks             -- Vital signs:  q12 hours             -- Precautions: suicide, elopement, and assault   2. Psychiatric Diagnoses and Treatment:    # Schizoaffective disorder, bipolar type -Start Haldol 5 mg BID for psychosis -Start Cogentin 0.5 mg BID for EPS Prophylaxis -- Previously discontinued Risperdal 1 mg, 2 times daily on admission -- Discontinue Abilify 20 mg on 4/7 due to lack of efficacy     --  The  risks/benefits/side-effects/alternatives to this medication were discussed in detail with the patient and time was given for questions. The patient consents to medication trial.  -- FDA -- Metabolic profile and EKG monitoring obtained while on an atypical antipsychotic (BMI: Lipid Panel: HbgA1c: QTc:)    #Continue As Needed Medications --Hydroxyzine 25 mg oral, 3  times daily as needed, anxiety --Trazodone 50 mg, oral, daily at bedtime as needed, sleep   #Continue BH Agitation Protocol --Haldol 5 mg, oral, 3 times daily as needed, mild agitation --Benadryl 50 mg, oral, 3 times daily as needed, mild agitation                                     OR  --Haldol injection 5 mg, IM, 3 times daily as needed, moderate agitation --Benadryl injection 50 mg, IM, 3 times daily as needed, moderate agitation --Ativan injection 2 mg, IM, 3 times daily as needed, moderate agitation                                     OR --Haldol injection 10 mg, IM, 3 times daily as needed, severe agitation --Benadryl injection 50 mg, IM, 3 times daily as needed, severe agitation --Ativan injection 2 mg, IM, 3 times daily as needed, severe agitation     -- Encouraged patient to participate in unit milieu and in scheduled group therapies  -- Short Term Goals: Ability to identify changes in lifestyle to reduce recurrence of condition will improve, Ability to verbalize feelings will improve, Ability to disclose and discuss suicidal ideas, Ability to demonstrate self-control will improve, Ability to identify and develop effective coping behaviors will improve, Ability to maintain clinical measurements within normal limits will improve, Compliance with prescribed medications will improve, and Ability to identify triggers associated with substance abuse/mental health issues will improve -- Long Term Goals: Improvement in symptoms so as ready for discharge               3. Medical Issues Being Addressed:               #  Uncontrolled type 1 diabetes with hyperglycemia --Referral to Diabetes Coordinator --Current orders for Inpatient glycemic control (goal CBG 140-180) Continue Semglee to 35 units daily Continue insulin aspart to 15 mg tid for mealtime converage Novolog 0-9 units, 3 times daily with meals NovoLog 0-5 units, daily at bedtime   # Vitamin D Deficiency -- Continue vitamin D 50,000 units, oral, every 7 days   #Tobacco Use Disorder             -- Declined Nicorette gum   -- Declined Nicotine patch              -- Smoking cessation encouraged   #Continue As Needed Medications --Tylenol 650 mg every 6 hours, as needed, mild pain, fever --Maalox/Mylanta 30 mL oral every 4 hours as needed, indigestion --Milk of Magnesia 30 mL oral daily as needed, mild constipation   4. Labs    CMP: Unremarkable TSH : Unremarkable CBC: Na+ 126 (given 1000 mL bolus sodium chloride in the ED) Hemoglobin A1c: Elevated at 14.8 Lipid Panel: Cholesterol slightly elevated at 201 and LDL at 109           UDS: 06/29/5620 at 1208 + Marijuana; 01/28/2024 at 1603 negative  UA: Colorless urine, glucose >=500, Ketones 20   EKG: QTc   428      5. Discharge Planning:              -- Social work and case management to assist with discharge planning and identification of hospital follow-up needs prior to discharge             --  Estimated LOS: 5-7 days             -- Discharge Concerns: Need to establish a safety plan; Medication compliance and effectiveness             -- Discharge Goals: Return home with outpatient referrals for mental health follow-up including medication management/psychotherapy      I certify that inpatient services furnished can reasonably be expected to improve the patient's condition.     Starleen Blue, NP 02/06/2024, 6:40 PM Patient ID: Jacob [name], male   DOB: 1990/12/01, 33 y.o.   MRN: [id number]

## 2024-02-06 NOTE — Inpatient Diabetes Management (Signed)
 Inpatient Diabetes Program Recommendations  AACE/ADA: New Consensus Statement on Inpatient Glycemic Control   Target Ranges:  Prepandial:   less than 140 mg/dL      Peak postprandial:   less than 180 mg/dL (1-2 hours)      Critically ill patients:  140 - 180 mg/dL    Latest Reference Range & Units 02/05/24 06:09 02/05/24 11:52 02/05/24 17:07 02/05/24 20:39 02/06/24 05:51  Glucose-Capillary 70 - 99 mg/dL 045 (H) 409 (H) 811 (H) 137 (H) 233 (H)   Review of Glycemic Control  Diabetes history: DM2 Outpatient Diabetes medications: Lantus 24 units daily, Humalog 10 units TID with meals Current orders for Inpatient glycemic control: Semglee 35 units daily, Novolog 15 units TID with meals, Novolog 0-9 units TID with meals, Novolog 0-5 units QHS  Inpatient Diabetes Program Recommendations:    Insulin: Please consider increasing Semglee to 38 units daily.  Thanks, Orlando Penner, RN, MSN, CDCES Diabetes Coordinator Inpatient Diabetes Program [phone] (Team Pager from 8am to 5pm)

## 2024-02-06 NOTE — Progress Notes (Signed)
 Patient's CBG rechecked following lunch, result was 135. Administered 1 unit of insulin aspart per sliding scale for blood sugar of 135. Held order for 15 units of insulin aspart due to patient reporting that he did not eat more than 50% of his lunch. Patient is resting comfortably in his bed at this time with no s/s of distress.

## 2024-02-06 NOTE — Progress Notes (Signed)
   02/05/24 2024  Psych Admission Type (Psych Patients Only)  Admission Status Voluntary  Psychosocial Assessment  Patient Complaints None  Eye Contact Fair  Facial Expression Animated  Affect Appropriate to circumstance  Speech Logical/coherent  Interaction Assertive  Motor Activity Other (Comment) (WDL)  Appearance/Hygiene Disheveled  Behavior Characteristics Cooperative;Appropriate to situation  Mood Pleasant  Thought Process  Coherency WDL  Content WDL  Delusions None reported or observed  Perception WDL  Hallucination None reported or observed  Judgment Poor  Confusion None  Danger to Self  Current suicidal ideation? Denies  Agreement Not to Harm Self Yes  Description of Agreement verbal  Danger to Others  Danger to Others None reported or observed

## 2024-02-06 NOTE — Plan of Care (Signed)
   Problem: Education: Goal: Emotional status will improve Outcome: Progressing Goal: Mental status will improve Outcome: Progressing   Problem: Activity: Goal: Interest or engagement in activities will improve Outcome: Progressing Goal: Sleeping patterns will improve Outcome: Progressing

## 2024-02-06 NOTE — Progress Notes (Addendum)
 Patient denies SI/HI/AVH this morning. Pt rates their depression a 0/10 and anxiety a 0/10. Pt reports that they slept "good" last night. Pt has been laying in bed for most of the day today. Pt has been calm and cooperative throughout the day. Patient has been compliant with medications and treatment plan. Q 15 minute safety checks are in place for patient's safety. Patient is currently safe on the unit.   02/06/24 0900  Psych Admission Type (Psych Patients Only)  Admission Status Voluntary  Psychosocial Assessment  Patient Complaints None  Eye Contact Fair  Facial Expression Animated  Affect Appropriate to circumstance  Speech Logical/coherent  Interaction Assertive  Motor Activity Other (Comment) (WNL)  Appearance/Hygiene Unremarkable  Behavior Characteristics Appropriate to situation  Mood Pleasant  Thought Process  Coherency WDL  Content WDL  Delusions None reported or observed  Perception WDL  Hallucination None reported or observed  Confusion None  Danger to Self  Current suicidal ideation? Denies  Description of Suicide Plan no plan  Agreement Not to Harm Self Yes  Description of Agreement verbal  Danger to Others  Danger to Others None reported or observed

## 2024-02-06 NOTE — [facility] Group Notes (Addendum)

## 2024-02-06 NOTE — Group Note (Signed)
 Date:  02/06/2024 Time:  9:20 AM  Group Topic/Focus:  Goals Group:   The focus of this group is to help patients establish daily goals to achieve during treatment and discuss how the patient can incorporate goal setting into their daily lives to aide in recovery.    Participation Level:  Did Not Attend  Erasmo Score 02/06/2024, 9:20 AM

## 2024-02-07 DIAGNOSIS — F250 Schizoaffective disorder, bipolar type: Secondary | ICD-10-CM | POA: Diagnosis not present

## 2024-02-07 LAB — GLUCOSE, CAPILLARY
Glucose-Capillary: 124 mg/dL — ABNORMAL HIGH (ref 70–99)
Glucose-Capillary: 269 mg/dL — ABNORMAL HIGH (ref 70–99)
Glucose-Capillary: 162 mg/dL — ABNORMAL HIGH (ref 70–99)
Glucose-Capillary: 79 mg/dL (ref 70–99)

## 2024-02-07 NOTE — Group Note (Unsigned)
 Date:  02/07/2024 Time:  11:00 AM  Group Topic/Focus:  Goals Group:   The focus of this group is to help patients establish daily goals to achieve during treatment and discuss how the patient can incorporate goal setting into their daily lives to aide in recovery.     Participation Level:  {BHH PARTICIPATION ZOXWR:60454}  Participation Quality:  {BHH PARTICIPATION QUALITY:22265}  Affect:  {BHH AFFECT:22266}  Cognitive:  {BHH COGNITIVE:22267}  Insight: {BHH Insight2:20797}  Engagement in Group:  {BHH ENGAGEMENT IN UJWJX:91478}  Modes of Intervention:  {BHH MODES OF INTERVENTION:22269}  Additional Comments:  ***  Jacob [name] 02/07/2024, 11:00 AM

## 2024-02-07 NOTE — Plan of Care (Signed)

## 2024-02-07 NOTE — Progress Notes (Deleted)
 Patient ID: Jacob [name], male   DOB: [birth year], 33 y.o.   MRN: [id number]  [facility] MD Progress Note  02/07/2024 1:35 PM Alen Favor  MRN:  [id number] Principal Problem: Schizoaffective disorder, bipolar type (HCC) Diagnosis: Principal Problem:   Schizoaffective disorder, bipolar type (HCC) Active Problems:   Tobacco dependence   Uncontrolled type 1 diabetes mellitus with hyperglycemia, with long-term current use of insulin (HCC)   Vitamin D deficiency   History of Present Illness: Jacob [name], is a 33 year old male, with a significant psychiatric history of schizophrenia and medication noncompliance and medical history significant for poorly controlled type 1 diabetes.  On March 29, he presented to [facility] urgent care seeking housing and reported increased paranoia and auditory hallucinations.  He was subsequently transferred to [facility] emergency department due to hyperglycemia, with a recorded blood sugar level of 750.  After receiving medical clearance, he was admitted to [facility] on April 2 for further psychiatric care.   Chart review from last 24 hours: Sleep Hours last night: 9.5hrs per nursing and pt also reports a good sleep quality last night. Nursing Concerns: None reported Behavioral episodes in the past 24 NWG:NFAO  Medication Compliance: Compliant  Vital Signs in the past 24 hrs: WNL PRN Medications in the past 24 hrs: Tylenol and hydroxyzine, both PRN meds.  Daily Evaluation:  Pt was ambulating in the hallway on approach. Pt stated that he is "ready to go." Pt reports that he has court "for domestic stuff with grandma." Pt reports depression today a 1/10 and anxiety a 1/10, both on scales with 10 as the worst. Pt reports even though he had a bad dream last night, his sleep was good. Pt also reports that his appetite is good Pt denies SI/HI, AVH, and paranoia. Reported pain in his lower back today at 7/10, in which he "took Tylenol" PRN.  Pt reports no medication side effects.  Abilify 20 mg daily is discontinued and Haldol 5 mg BID, and Cogentin 0.5 mg BID for EPS prophylaxis started. Continue with plan for an LAI prior to discharge due to history of non compliance. We are requiring for patient to remain hospitalized at this time as we are continuing to make adjustments to medications in medications in an effort to treat psychosis. We are requiring to monitor for potential side effects with this change in medication, and need a few more days of hospitalization.  Discussed the following psychosocial stressors: Homelessness. Pt states that his whole family is under investigation for attempted murder, and begins listing people. This is most likely part of his psychosis. We will revisit discharge planning as psychosis begins to resolve.     Labs reviewed: Last Ha1c ordered was 14.8; unsure if this was fasting.   Total Time spent with patient: 45 minutes  Past Psychiatric History: See H&P  Past Medical History:  Past Medical History:  Diagnosis Date   Bipolar 1 disorder (HCC)    History of attempted suicide 2019   Unsuccessful suicide attempt in 2019 with rat poison   Schizoaffective disorder (HCC)    Type 1 diabetes mellitus on insulin therapy (HCC) 2019   Vitamin D deficiency 01/03/2024   History reviewed. No pertinent surgical history. Family History:  Family History  Problem Relation Age of Onset   Healthy Mother    Healthy Father    Family Psychiatric  History: See H&P Social History:  Social History   Substance and Sexual Activity  Alcohol Use Yes  Comment: social/occassional     Social History   Substance and Sexual Activity  Drug Use Not Currently   Types: Marijuana    Social History   Socioeconomic History   Marital status: Single    Spouse name: Not on file   Number of children: Not on file   Years of education: Not on file   Highest education level: Not on file  Occupational History   Not on  file  Tobacco Use   Smoking status: Former    Types: Cigars, Cigarettes   Smokeless tobacco: Never   Tobacco comments:    2 BLACK AND MILD A DAY  Vaping Use   Vaping status: Every Day  Substance and Sexual Activity   Alcohol use: Yes    Comment: social/occassional   Drug use: Not Currently    Types: Marijuana   Sexual activity: Not on file  Other Topics Concern   Not on file  Social History Narrative   Not on file   Social Drivers of Health   Financial Resource Strain: Not on file  Food Insecurity: Food Insecurity Present (02/02/2024)   Hunger Vital Sign    Worried About Running Out of Food in the Last Year: Sometimes true    Ran Out of Food in the Last Year: Sometimes true  Transportation Needs: Unmet Transportation Needs (02/02/2024)   PRAPARE - Administrator, Civil Service (Medical): Yes    Lack of Transportation (Non-Medical): No  Physical Activity: Not on file  Stress: Not on file  Social Connections: Socially Isolated (12/26/2023)   Social Connection and Isolation Panel [NHANES]    Frequency of Communication with Friends and Family: Never    Frequency of Social Gatherings with Friends and Family: Never    Attends Religious Services: Never    Database administrator or Organizations: No    Attends Engineer, structural: Never    Marital Status: Never married  Current Medications: Current Facility-Administered Medications  Medication Dose Route Frequency Provider Last Rate Last Admin   acetaminophen (TYLENOL) tablet 650 mg  650 mg Oral Q6H PRN Lenox Ponds, NP   650 mg at 02/07/24 1008   alum & mag hydroxide-simeth (MAALOX/MYLANTA) 200-200-20 MG/5ML suspension 30 mL  30 mL Oral Q4H PRN Lenox Ponds, NP       benztropine (COGENTIN) tablet 0.5 mg  0.5 mg Oral BID Starleen Blue, NP   0.5 mg at 02/07/24 0844   haloperidol (HALDOL) tablet 5 mg  5 mg Oral TID PRN Lenox Ponds, NP       And   diphenhydrAMINE (BENADRYL) capsule 50 mg  50 mg Oral  TID PRN Lenox Ponds, NP       haloperidol lactate (HALDOL) injection 5 mg  5 mg Intramuscular TID PRN Lenox Ponds, NP       And   diphenhydrAMINE (BENADRYL) injection 50 mg  50 mg Intramuscular TID PRN Lenox Ponds, NP       And   LORazepam (ATIVAN) injection 2 mg  2 mg Intramuscular TID PRN Lenox Ponds, NP       haloperidol lactate (HALDOL) injection 10 mg  10 mg Intramuscular TID PRN Lenox Ponds, NP       And   diphenhydrAMINE (BENADRYL) injection 50 mg  50 mg Intramuscular TID PRN Lenox Ponds, NP       And   LORazepam (ATIVAN) injection 2 mg  2 mg Intramuscular TID PRN Andria Meuse,  Sherrian Divers, NP       haloperidol (HALDOL) tablet 5 mg  5 mg Oral BID Starleen Blue, NP   5 mg at 02/07/24 0845   hydrOXYzine (ATARAX) tablet 25 mg  25 mg Oral TID PRN Willeen Cass, Christal H, NP   25 mg at 02/07/24 0149   insulin aspart (novoLOG) injection 0-5 Units  0-5 Units Subcutaneous QHS Lenox Ponds, NP   2 Units at 02/03/24 2122   insulin aspart (novoLOG) injection 0-9 Units  0-9 Units Subcutaneous TID WC Lenox Ponds, NP   5 Units at 02/07/24 1156   insulin aspart (novoLOG) injection 15 Units  15 Units Subcutaneous TID Hinton Lovely, MD   15 Units at 02/07/24 1156   insulin glargine-yfgn (SEMGLEE) injection 35 Units  35 Units Subcutaneous Daily Park Pope, MD   35 Units at 02/07/24 0849   magnesium hydroxide (MILK OF MAGNESIA) suspension 30 mL  30 mL Oral Daily PRN Lenox Ponds, NP       traZODone (DESYREL) tablet 50 mg  50 mg Oral Seabron Spates, MD   50 mg at 02/06/24 2155   Vitamin D (Ergocalciferol) (DRISDOL) 1.25 MG (50000 UNIT) capsule 50,000 Units  50,000 Units Oral Q7 days Lenox Ponds, NP   50,000 Units at 02/06/24 8413    Lab Results:  Results for orders placed or performed during the hospital encounter of 02/02/24 (from the past 48 hours)  Glucose, capillary     Status: Abnormal   Collection Time: 02/05/24  5:07 PM  Result Value Ref Range    Glucose-Capillary 192 (H) 70 - 99 mg/dL    Comment: Glucose reference range applies only to samples taken after fasting for at least 8 hours.  Glucose, capillary     Status: Abnormal   Collection Time: 02/05/24  8:39 PM  Result Value Ref Range   Glucose-Capillary 137 (H) 70 - 99 mg/dL    Comment: Glucose reference range applies only to samples taken after fasting for at least 8 hours.   Comment 1 Notify RN    Comment 2 Document in Chart   Glucose, capillary     Status: Abnormal   Collection Time: 02/06/24  5:51 AM  Result Value Ref Range   Glucose-Capillary 233 (H) 70 - 99 mg/dL    Comment: Glucose reference range applies only to samples taken after fasting for at least 8 hours.   Comment 1 Notify RN    Comment 2 Document in Chart   Basic metabolic panel     Status: Abnormal   Collection Time: 02/06/24  6:21 AM  Result Value Ref Range   Sodium 137 135 - 145 mmol/L   Potassium 4.0 3.5 - 5.1 mmol/L   Chloride 103 98 - 111 mmol/L   CO2 27 22 - 32 mmol/L   Glucose, Bld 218 (H) 70 - 99 mg/dL    Comment: Glucose reference range applies only to samples taken after fasting for at least 8 hours.   BUN 11 6 - 20 mg/dL   Creatinine, Ser 2.44 (L) 0.61 - 1.24 mg/dL   Calcium 9.0 8.9 - 01.0 mg/dL   GFR, Estimated >27 >25 mL/min    Comment: (NOTE) Calculated using the CKD-EPI Creatinine Equation (2021)    Anion gap 7 5 - 15    Comment: Performed at [facility], [address], [place], [place] [address]  Glucose, capillary     Status: None   Collection Time: 02/06/24 11:44 AM  Result Value Ref Range   Glucose-Capillary 77 70 - 99 mg/dL    Comment: Glucose reference range applies only to samples taken after fasting for at least 8 hours.  Glucose, capillary     Status: Abnormal   Collection Time: 02/06/24 12:37 PM  Result Value Ref Range   Glucose-Capillary 135 (H) 70 - 99 mg/dL    Comment: Glucose reference range applies only to samples taken after fasting for at least  8 hours.  Glucose, capillary     Status: Abnormal   Collection Time: 02/06/24  5:06 PM  Result Value Ref Range   Glucose-Capillary 230 (H) 70 - 99 mg/dL    Comment: Glucose reference range applies only to samples taken after fasting for at least 8 hours.  Glucose, capillary     Status: Abnormal   Collection Time: 02/06/24  9:09 PM  Result Value Ref Range   Glucose-Capillary 125 (H) 70 - 99 mg/dL    Comment: Glucose reference range applies only to samples taken after fasting for at least 8 hours.   Comment 1 Notify RN    Comment 2 Document in Chart   Glucose, capillary     Status: Abnormal   Collection Time: 02/07/24  5:55 AM  Result Value Ref Range   Glucose-Capillary 124 (H) 70 - 99 mg/dL    Comment: Glucose reference range applies only to samples taken after fasting for at least 8 hours.   Comment 1 Notify RN    Comment 2 Document in Chart   Glucose, capillary     Status: Abnormal   Collection Time: 02/07/24 11:52 AM  Result Value Ref Range   Glucose-Capillary 269 (H) 70 - 99 mg/dL    Comment: Glucose reference range applies only to samples taken after fasting for at least 8 hours.   Comment 1 Notify RN    Comment 2 Document in Chart     Blood Alcohol level:  Lab Results  Component Value Date   ETH <10 01/28/2024   ETH <10 01/28/2024    Metabolic Disorder Labs: Lab Results  Component Value Date   HGBA1C 14.8 (H) 01/28/2024   MPG 378.06 01/28/2024   MPG 280.48 10/23/2023   Lab Results  Component Value Date   PROLACTIN 18.7 12/27/2023   Lab Results  Component Value Date   CHOL 201 (H) 01/28/2024   TRIG 85 01/28/2024   HDL 75 01/28/2024   CHOLHDL 2.7 01/28/2024   VLDL 17 01/28/2024   LDLCALC 109 (H) 01/28/2024   LDLCALC 77 12/27/2023    Physical Findings: AIMS:  , ,  ,0  ,    CIWA:   N/A COWS:   N/A  Musculoskeletal: Strength & Muscle Tone: within normal limits Gait & Station: normal Patient leans: N/A  Psychiatric Specialty Exam:  Presentation   General Appearance:  Disheveled  Eye Contact: Fair  Speech: Clear and Coherent  Speech Volume: Decreased  Handedness: Right   Mood and Affect  Mood: Anxious  Affect: Congruent   Thought Process  Thought Processes: Disorganized  Descriptions of Associations:Circumstantial  Orientation:Partial  Thought Content:Illogical  History of Schizophrenia/Schizoaffective disorder:Yes  Duration of Psychotic Symptoms:Greater than six months  Hallucinations:Hallucinations: None    Ideas of Reference:Delusions  Suicidal Thoughts:Suicidal Thoughts: No    Homicidal Thoughts:Homicidal Thoughts: No     Sensorium  Memory: Recent Poor  Judgment: Poor  Insight: Poor   Executive Functions  Concentration: Fair  Attention Span: Fair  Recall: [employer] of Knowledge: Fair  Language:  Fair   Psychomotor Activity  Psychomotor Activity: Psychomotor Activity: Normal     Assets  Assets: Resilience   Sleep  Sleep: Sleep: Poor      Physical Exam: Physical Exam Vitals and nursing note reviewed.  Constitutional:      General: He is not in acute distress.    Appearance: He is normal weight. He is not ill-appearing.  Cardiovascular:     Rate and Rhythm: Normal rate.     Pulses: Normal pulses.  Neurological:     Mental Status: He is alert and oriented to person, place, and time.  Psychiatric:        Mood and Affect: Mood normal.        Speech: Speech is tangential.        Behavior: Behavior normal.        Thought Content: Thought content normal.        Judgment: Judgment normal.    Review of Systems  Constitutional: Negative.   Respiratory:  Negative for shortness of breath.   Cardiovascular:  Negative for chest pain and palpitations.  Gastrointestinal:  Negative for constipation, diarrhea, nausea and vomiting.  Musculoskeletal:  Negative for falls.  Skin: Negative.   Neurological:  Negative for dizziness, tingling, tremors and  headaches.  Psychiatric/Behavioral:  Positive for depression, hallucinations and substance abuse. Negative for memory loss and suicidal ideas. The patient is nervous/anxious and has insomnia.    Blood pressure 124/86, pulse 84, temperature 98.7 F (37.1 C), temperature source Oral, resp. rate 16, height 5\' 9"  (1.753 m), weight 55.3 kg, SpO2 99%. Body mass index is 18.02 kg/m.   Treatment Plan Summary: Daily contact with patient to assess and evaluate symptoms and progress in treatment and Medication management  ACT team to see tomorrow to evaluate for appropriateness of patient to services.   Diagnoses / Active Problems: Principal Problem:   Schizoaffective disorder, bipolar type (HCC) Active Problems:   Uncontrolled type 1 diabetes mellitus with hyperglycemia, with long-term current use of insulin (HCC)              PLAN: Safety and Monitoring:             --  Voluntary admission to inpatient psychiatric unit for safety, stabilization and treatment             -- Daily contact with patient to assess and evaluate symptoms and progress in treatment             -- Patient's case to be discussed in multi-disciplinary team meeting             -- Observation Level : q15 minute checks             -- Vital signs:  q12 hours             -- Precautions: suicide, elopement, and assault   2. Psychiatric Diagnoses and Treatment:    # Schizoaffective disorder, bipolar type -Start Haldol 5 mg BID for psychosis -Start Cogentin 0.5 mg BID for EPS Prophylaxis -- Previously discontinued Risperdal 1 mg, 2 times daily on admission -- Discontinue Abilify 20 mg on 4/7 due to lack of efficacy     --  The risks/benefits/side-effects/alternatives to this medication were discussed in detail with the patient and time was given for questions. The patient consents to medication trial.  -- FDA -- Metabolic profile and EKG monitoring obtained while on an atypical antipsychotic (BMI: Lipid Panel: HbgA1c: QTc:)     #Continue As Needed  Medications --Hydroxyzine 25 mg oral, 3 times daily as needed, anxiety --Trazodone 50 mg, oral, daily at bedtime as needed, sleep   #Continue BH Agitation Protocol --Haldol 5 mg, oral, 3 times daily as needed, mild agitation --Benadryl 50 mg, oral, 3 times daily as needed, mild agitation                                     OR  --Haldol injection 5 mg, IM, 3 times daily as needed, moderate agitation --Benadryl injection 50 mg, IM, 3 times daily as needed, moderate agitation --Ativan injection 2 mg, IM, 3 times daily as needed, moderate agitation                                     OR --Haldol injection 10 mg, IM, 3 times daily as needed, severe agitation --Benadryl injection 50 mg, IM, 3 times daily as needed, severe agitation --Ativan injection 2 mg, IM, 3 times daily as needed, severe agitation     -- Encouraged patient to participate in unit milieu and in scheduled group therapies  -- Short Term Goals: Ability to identify changes in lifestyle to reduce recurrence of condition will improve, Ability to verbalize feelings will improve, Ability to disclose and discuss suicidal ideas, Ability to demonstrate self-control will improve, Ability to identify and develop effective coping behaviors will improve, Ability to maintain clinical measurements within normal limits will improve, Compliance with prescribed medications will improve, and Ability to identify triggers associated with substance abuse/mental health issues will improve -- Long Term Goals: Improvement in symptoms so as ready for discharge               3. Medical Issues Being Addressed:               # Uncontrolled type 1 diabetes with hyperglycemia --Referral to Diabetes Coordinator --Current orders for Inpatient glycemic control (goal CBG 140-180) Continue Semglee to 35 units daily Continue insulin aspart to 15 mg tid for mealtime converage Novolog 0-9 units, 3 times daily with meals NovoLog 0-5 units,  daily at bedtime   # Vitamin D Deficiency -- Continue vitamin D 50,000 units, oral, every 7 days   #Tobacco Use Disorder             -- Declined Nicorette gum   -- Declined Nicotine patch              -- Smoking cessation encouraged   #Continue As Needed Medications --Tylenol 650 mg every 6 hours, as needed, mild pain, fever --Maalox/Mylanta 30 mL oral every 4 hours as needed, indigestion --Milk of Magnesia 30 mL oral daily as needed, mild constipation   4. Labs    CMP: Unremarkable TSH : Unremarkable CBC: Na+ 126 (given 1000 mL bolus sodium chloride in the ED) Hemoglobin A1c: Elevated at 14.8 Lipid Panel: Cholesterol slightly elevated at 201 and LDL at 109           UDS: 1/61/0960 at 1208 + Marijuana; 01/28/2024 at 1603 negative  UA: Colorless urine, glucose >=500, Ketones 20   EKG: QTc   428      5. Discharge Planning:              -- Social work and case management to assist with discharge planning and identification of hospital follow-up needs prior to discharge             --  Estimated LOS: 5-7 days             -- Discharge Concerns: Need to establish a safety plan; Medication compliance and effectiveness             -- Discharge Goals: Return home with outpatient referrals for mental health follow-up including medication management/psychotherapy      I certify that inpatient services furnished can reasonably be expected to improve the patient's condition.     Dossie Arbour, RN 02/07/2024, 1:35 PM Patient ID: Nada Libman, male   DOB: 03-31-1991, 33 y.o.   MRN: [id number]

## 2024-02-07 NOTE — Group Note (Signed)
 Recreation Therapy Group Note   Group Topic:Animal Assisted Therapy   Group Date: 02/07/2024 Start Time: 0946 End Time: 1030 Facilitators: Caliya Narine-McCall, LRT,CTRS Location: [address] Dayroom   Animal-Assisted Activity (AAA) Program Checklist/Progress Notes Patient Eligibility Criteria Checklist & Daily Group note for Rec Tx Intervention  AAA/T Program Assumption of Risk Form signed by Patient/ or Parent Legal Guardian Yes  Patient is free of allergies or severe asthma Yes  Patient reports no fear of animals Yes  Patient reports no history of cruelty to animals Yes  Patient understands his/her participation is voluntary Yes  Patient washes hands before animal contact Yes  Patient washes hands after animal contact Yes  Behavioral Response: Engaged   Education: Charity fundraiser, Appropriate Animal Interaction   Education Outcome: Acknowledges education.    Affect/Mood: Appropriate   Participation Level: Engaged   Participation Quality: Independent   Behavior: Appropriate   Speech/Thought Process: Focused   Insight: Good   Judgement: Good   Modes of Intervention: Teaching laboratory technician   Patient Response to Interventions:  Engaged   Education Outcome:  In group clarification offered    Clinical Observations/Individualized Feedback:  Patient attended session and interacted appropriately with therapy dog and peers. Patient asked appropriate questions about therapy dog and his training. Patient shared stories about their pets at home with group.    Plan: Continue to engage patient in RT group sessions 2-3x/week.   Tad Fancher-McCall, LRT,CTRS 02/07/2024 2:04 PM

## 2024-02-07 NOTE — Progress Notes (Signed)
  Jacob [name]   Type of Note: ACTT  Patient was seen and evaluated by [facility] yesterday, pt was accepted for their services at discharge. Pt will receive therapy and medication management. Pt agreeable, MD aware.  Signed:  Dimitrius Steedman, LCSW-A 02/07/2024  9:58 AM

## 2024-02-07 NOTE — Progress Notes (Signed)
   02/07/24 1400  Psych Admission Type (Psych Patients Only)  Admission Status Voluntary  Psychosocial Assessment  Patient Complaints None  Eye Contact Fair  Facial Expression Animated  Affect Appropriate to circumstance  Speech Logical/coherent  Interaction Assertive  Motor Activity Other (Comment) (WNL)  Appearance/Hygiene Unremarkable  Behavior Characteristics Cooperative  Mood Pleasant  Aggressive Behavior  Effect No apparent injury  Thought Process  Coherency WDL  Content WDL  Delusions None reported or observed  Perception WDL  Hallucination None reported or observed  Judgment Impaired  Confusion None  Danger to Self  Current suicidal ideation? Denies  Agreement Not to Harm Self Yes  Description of Agreement verbal  Danger to Others  Danger to Others None reported or observed

## 2024-02-07 NOTE — Progress Notes (Signed)
 Pt observed out of his room interacting in the milieu. Pt was cal, cooperative, and polite. Pt's glucose level was 79 at bedtime and required no coverage. Pt was med compliant, requesting a PRN Tylenol for pain in his neck. PRN was reported effective. Pt denies SI, HI, and AVH, although he seems to be displaying disorganized thoughts. Pt was encouraged to reach out to staff for any needs or concerns that they may have. Pt on q15 minute checks for safety. Pt remains safe on the unit.    02/07/24 2045  Psych Admission Type (Psych Patients Only)  Admission Status Voluntary  Psychosocial Assessment  Patient Complaints None  Eye Contact Fair  Facial Expression Animated  Affect Appropriate to circumstance  Speech Logical/coherent  Interaction Assertive  Motor Activity Other (Comment) (WNL)  Appearance/Hygiene Unremarkable  Behavior Characteristics Appropriate to situation;Cooperative  Mood Pleasant  Thought Process  Coherency WDL  Content WDL  Delusions None reported or observed  Perception WDL  Hallucination None reported or observed  Judgment Impaired  Confusion None  Danger to Self  Current suicidal ideation? Denies  Agreement Not to Harm Self Yes  Description of Agreement Verbal Contract  Danger to Others  Danger to Others None reported or observed

## 2024-02-07 NOTE — Group Note (Unsigned)
 Date:  02/07/2024 Time:  10:07 AM  Group Topic/Focus:  Goals Group:   The focus of this group is to help patients establish daily goals to achieve during treatment and discuss how the patient can incorporate goal setting into their daily lives to aide in recovery.     Participation Level:  {BHH PARTICIPATION ZOXWR:60454}  Participation Quality:  {BHH PARTICIPATION QUALITY:22265}  Affect:  {BHH AFFECT:22266}  Cognitive:  {BHH COGNITIVE:22267}  Insight: {BHH Insight2:20797}  Engagement in Group:  {BHH ENGAGEMENT IN UJWJX:91478}  Modes of Intervention:  {BHH MODES OF INTERVENTION:22269}  Additional Comments:  ***  Jacob [name] 02/07/2024, 10:07 AM

## 2024-02-07 NOTE — Plan of Care (Signed)
   Problem: Education: Goal: Mental status will improve Outcome: Progressing   Problem: Activity: Goal: Interest or engagement in activities will improve Outcome: Progressing

## 2024-02-07 NOTE — Group Note (Unsigned)
 Date:  02/07/2024 Time:  10:53 AM  Group Topic/Focus:  Goals Group:   The focus of this group is to help patients establish daily goals to achieve during treatment and discuss how the patient can incorporate goal setting into their daily lives to aide in recovery.     Participation Level:  {BHH PARTICIPATION LKGMW:10272}  Participation Quality:  {BHH PARTICIPATION QUALITY:22265}  Affect:  {BHH AFFECT:22266}  Cognitive:  {BHH COGNITIVE:22267}  Insight: {BHH Insight2:20797}  Engagement in Group:  {BHH ENGAGEMENT IN ZDGUY:40347}  Modes of Intervention:  {BHH MODES OF INTERVENTION:22269}  Additional Comments:  ***  [name] 02/07/2024, 10:53 AM

## 2024-02-07 NOTE — Progress Notes (Signed)
   02/06/24 2015  Psych Admission Type (Psych Patients Only)  Admission Status Voluntary  Psychosocial Assessment  Patient Complaints None  Eye Contact Fair  Facial Expression Animated  Affect Appropriate to circumstance  Speech Logical/coherent  Interaction Assertive  Motor Activity Other (Comment) (WNL)  Appearance/Hygiene Unremarkable  Behavior Characteristics Appropriate to situation  Mood Pleasant  Thought Process  Coherency WDL  Content WDL  Delusions None reported or observed  Perception WDL  Hallucination None reported or observed  Judgment Impaired  Confusion None  Danger to Self  Current suicidal ideation? Denies  Agreement Not to Harm Self Yes  Description of Agreement Verbal  Danger to Others  Danger to Others None reported or observed

## 2024-02-07 NOTE — Progress Notes (Cosign Needed Addendum)
 [facility] MD Progress Note  02/07/2024 8:41 PM Jacob [name]  MRN:  [id number] Principal Problem: Schizoaffective disorder, bipolar type (HCC) Diagnosis: Principal Problem:   Schizoaffective disorder, bipolar type (HCC) Active Problems:   Tobacco dependence   Uncontrolled type 1 diabetes mellitus with hyperglycemia, with long-term current use of insulin (HCC)   Vitamin D deficiency   History of Present Illness: Jacob [name], is a 33 year old male, with a significant psychiatric history of schizophrenia and medication noncompliance and medical history significant for poorly controlled type 1 diabetes.  On March 29, he presented to [facility] urgent care seeking housing and reported increased paranoia and auditory hallucinations.  He was subsequently transferred to [facility] emergency department due to hyperglycemia, with a recorded blood sugar level of 750.  After receiving medical clearance, he was admitted to [facility] on April 2 for further psychiatric care.   Chart review from last 24 hours:  Patient remains compliant with medications, PRNs overnight consisted of hydroxyzine, and Tylenol.  Slept 9.5 hours as per nursing flow sheets.  No behavioral concerns in the last 24 hours.     Daily Evaluation:  On evaluation today, patient is more linear, more coherent, more logical as compared to yesterday.  He denies suicidal ideations, denies homicidal ideations, denies auditory or visual hallucinations, denies paranoia, continues to present with some lingering delusional thinking, but not to the extent that he has been;  He states that the department of the [employer] is representing him at a court case regarding the 50-B placed on him by his grandmother. He is not fixated on this like he was as reported by staff who saw him on initial hospitalization. He does not talk about this unless he is asked about it at time of this encounter. Presents with some disorganized thought  contents, but answers questions logically.  Patient has shown a dramatic improvement in his mental status with the switch from Ability to Haldol, and is currently tolerating the current dose of 5 mg BID, with no issues. Denies side effects related to this medication. On Cogentin 0.5 mg BID for EPS prophylaxis. AIMS:0. Continuing medication at current dosage as he is showing improvement and there is no need for an increase at this time.   Recommendations initially made by diabetic consultant for pt's Semglee to be increased from 35 to 38 units nightly, due to blood glucose being in the 200s, but a review of pt's blood glucose flowsheets show that blood glucose has not consistently trended in the 200s. Reading from earlier today morning (fasting) was 162. A repeat Ha1c was ordered to more accurately determine blood glucose control as current one of 14 may not have been fasting. That result is pending, and Semglee has been maintained at 35 units nightly, while awaiting those results.  Discussed LAI with patient since he has been accepted by [facility] community assertive Team, and pt is resistant to the idea of an LAI. Patient reports that he typically resides at the [place] [facility] due to his homelessness, and also resides at the [facility], and stores his insulin on his body.  We talked about storing it at the [facility], he disputed this, stating that they close their doors at 2:30 PM rendering the insulin inaccessible.  We also discussed the option of patient residing at [facility], to which he was resistant, stating that he does not like this option either, and would "figure it out".    From a mental health standpoint, patient is ready  for discharge, and the treating team should consider discharging him on 4/9 as long as he has outpatient follow-up appointments in place for continuity of care.   Total Time spent with patient: 45 minutes  Past Psychiatric History: See H&P  Past Medical History:  Past  Medical History:  Diagnosis Date   Bipolar 1 disorder (HCC)    History of attempted suicide 2019   Unsuccessful suicide attempt in 2019 with rat poison   Schizoaffective disorder (HCC)    Type 1 diabetes mellitus on insulin therapy (HCC) 2019   Vitamin D deficiency 01/03/2024   History reviewed. No pertinent surgical history. Family History:  Family History  Problem Relation Age of Onset   Healthy Mother    Healthy Father    Family Psychiatric  History: See H&P Social History:  Social History   Substance and Sexual Activity  Alcohol Use Yes   Comment: social/occassional     Social History   Substance and Sexual Activity  Drug Use Not Currently   Types: Marijuana    Social History   Socioeconomic History   Marital status: Single    Spouse name: Not on file   Number of children: Not on file   Years of education: Not on file   Highest education level: Not on file  Occupational History   Not on file  Tobacco Use   Smoking status: Former    Types: Cigars, Cigarettes   Smokeless tobacco: Never   Tobacco comments:    2 BLACK AND MILD A DAY  Vaping Use   Vaping status: Every Day  Substance and Sexual Activity   Alcohol use: Yes    Comment: social/occassional   Drug use: Not Currently    Types: Marijuana   Sexual activity: Not on file  Other Topics Concern   Not on file  Social History Narrative   Not on file   Social Drivers of Health   Financial Resource Strain: Not on file  Food Insecurity: Food Insecurity Present (02/02/2024)   Hunger Vital Sign    Worried About Running Out of Food in the Last Year: Sometimes true    Ran Out of Food in the Last Year: Sometimes true  Transportation Needs: Unmet Transportation Needs (02/02/2024)   PRAPARE - Administrator, Civil Service (Medical): Yes    Lack of Transportation (Non-Medical): No  Physical Activity: Not on file  Stress: Not on file  Social Connections: Socially Isolated (12/26/2023)   Social  Connection and Isolation Panel [NHANES]    Frequency of Communication with Friends and Family: Never    Frequency of Social Gatherings with Friends and Family: Never    Attends Religious Services: Never    Database administrator or Organizations: No    Attends Engineer, structural: Never    Marital Status: Never married  Current Medications: Current Facility-Administered Medications  Medication Dose Route Frequency Provider Last Rate Last Admin   acetaminophen (TYLENOL) tablet 650 mg  650 mg Oral Q6H PRN Lenox Ponds, NP   650 mg at 02/07/24 1008   alum & mag hydroxide-simeth (MAALOX/MYLANTA) 200-200-20 MG/5ML suspension 30 mL  30 mL Oral Q4H PRN Lenox Ponds, NP       benztropine (COGENTIN) tablet 0.5 mg  0.5 mg Oral BID Starleen Blue, NP   0.5 mg at 02/07/24 0844   haloperidol (HALDOL) tablet 5 mg  5 mg Oral TID PRN Lenox Ponds, NP       And  diphenhydrAMINE (BENADRYL) capsule 50 mg  50 mg Oral TID PRN Lenox Ponds, NP       haloperidol lactate (HALDOL) injection 5 mg  5 mg Intramuscular TID PRN Lenox Ponds, NP       And   diphenhydrAMINE (BENADRYL) injection 50 mg  50 mg Intramuscular TID PRN Lenox Ponds, NP       And   LORazepam (ATIVAN) injection 2 mg  2 mg Intramuscular TID PRN Lenox Ponds, NP       haloperidol lactate (HALDOL) injection 10 mg  10 mg Intramuscular TID PRN Lenox Ponds, NP       And   diphenhydrAMINE (BENADRYL) injection 50 mg  50 mg Intramuscular TID PRN Lenox Ponds, NP       And   LORazepam (ATIVAN) injection 2 mg  2 mg Intramuscular TID PRN Lenox Ponds, NP       haloperidol (HALDOL) tablet 5 mg  5 mg Oral BID Starleen Blue, NP   5 mg at 02/07/24 0845   hydrOXYzine (ATARAX) tablet 25 mg  25 mg Oral TID PRN Willeen Cass, Christal H, NP   25 mg at 02/07/24 0149   insulin aspart (novoLOG) injection 0-5 Units  0-5 Units Subcutaneous QHS Lenox Ponds, NP   2 Units at 02/03/24 2122   insulin aspart (novoLOG)  injection 0-9 Units  0-9 Units Subcutaneous TID WC Lenox Ponds, NP   2 Units at 02/07/24 1726   insulin aspart (novoLOG) injection 15 Units  15 Units Subcutaneous TID Hinton Lovely, MD   15 Units at 02/07/24 1725   insulin glargine-yfgn (SEMGLEE) injection 35 Units  35 Units Subcutaneous Daily Park Pope, MD   35 Units at 02/07/24 0849   magnesium hydroxide (MILK OF MAGNESIA) suspension 30 mL  30 mL Oral Daily PRN Lenox Ponds, NP       traZODone (DESYREL) tablet 50 mg  50 mg Oral Seabron Spates, MD   50 mg at 02/06/24 2155   Vitamin D (Ergocalciferol) (DRISDOL) 1.25 MG (50000 UNIT) capsule 50,000 Units  50,000 Units Oral Q7 days Lenox Ponds, NP   50,000 Units at 02/06/24 2841    Lab Results:  Results for orders placed or performed during the hospital encounter of 02/02/24 (from the past 48 hours)  Glucose, capillary     Status: Abnormal   Collection Time: 02/06/24  5:51 AM  Result Value Ref Range   Glucose-Capillary 233 (H) 70 - 99 mg/dL    Comment: Glucose reference range applies only to samples taken after fasting for at least 8 hours.   Comment 1 Notify RN    Comment 2 Document in Chart   Basic metabolic panel     Status: Abnormal   Collection Time: 02/06/24  6:21 AM  Result Value Ref Range   Sodium 137 135 - 145 mmol/L   Potassium 4.0 3.5 - 5.1 mmol/L   Chloride 103 98 - 111 mmol/L   CO2 27 22 - 32 mmol/L   Glucose, Bld 218 (H) 70 - 99 mg/dL    Comment: Glucose reference range applies only to samples taken after fasting for at least 8 hours.   BUN 11 6 - 20 mg/dL   Creatinine, Ser 3.24 (L) 0.61 - 1.24 mg/dL   Calcium 9.0 8.9 - 40.1 mg/dL   GFR, Estimated >02 >72 mL/min    Comment: (NOTE) Calculated using the CKD-EPI Creatinine Equation (2021)    Anion gap  7 5 - 15    Comment: Performed at [facility], [address], [place], [place] [address]  Glucose, capillary     Status: None   Collection Time: 02/06/24 11:44 AM  Result Value Ref Range    Glucose-Capillary 77 70 - 99 mg/dL    Comment: Glucose reference range applies only to samples taken after fasting for at least 8 hours.  Glucose, capillary     Status: Abnormal   Collection Time: 02/06/24 12:37 PM  Result Value Ref Range   Glucose-Capillary 135 (H) 70 - 99 mg/dL    Comment: Glucose reference range applies only to samples taken after fasting for at least 8 hours.  Glucose, capillary     Status: Abnormal   Collection Time: 02/06/24  5:06 PM  Result Value Ref Range   Glucose-Capillary 230 (H) 70 - 99 mg/dL    Comment: Glucose reference range applies only to samples taken after fasting for at least 8 hours.  Glucose, capillary     Status: Abnormal   Collection Time: 02/06/24  9:09 PM  Result Value Ref Range   Glucose-Capillary 125 (H) 70 - 99 mg/dL    Comment: Glucose reference range applies only to samples taken after fasting for at least 8 hours.   Comment 1 Notify RN    Comment 2 Document in Chart   Glucose, capillary     Status: Abnormal   Collection Time: 02/07/24  5:55 AM  Result Value Ref Range   Glucose-Capillary 124 (H) 70 - 99 mg/dL    Comment: Glucose reference range applies only to samples taken after fasting for at least 8 hours.   Comment 1 Notify RN    Comment 2 Document in Chart   Glucose, capillary     Status: Abnormal   Collection Time: 02/07/24 11:52 AM  Result Value Ref Range   Glucose-Capillary 269 (H) 70 - 99 mg/dL    Comment: Glucose reference range applies only to samples taken after fasting for at least 8 hours.   Comment 1 Notify RN    Comment 2 Document in Chart   Glucose, capillary     Status: Abnormal   Collection Time: 02/07/24  5:02 PM  Result Value Ref Range   Glucose-Capillary 162 (H) 70 - 99 mg/dL    Comment: Glucose reference range applies only to samples taken after fasting for at least 8 hours.   Comment 1 Notify RN    Comment 2 Document in Chart     Blood Alcohol level:  Lab Results  Component Value Date   ETH <10  01/28/2024   ETH <10 01/28/2024    Metabolic Disorder Labs: Lab Results  Component Value Date   HGBA1C 14.8 (H) 01/28/2024   MPG 378.06 01/28/2024   MPG 280.48 10/23/2023   Lab Results  Component Value Date   PROLACTIN 18.7 12/27/2023   Lab Results  Component Value Date   CHOL 201 (H) 01/28/2024   TRIG 85 01/28/2024   HDL 75 01/28/2024   CHOLHDL 2.7 01/28/2024   VLDL 17 01/28/2024   LDLCALC 109 (H) 01/28/2024   LDLCALC 77 12/27/2023    Physical Findings: AIMS:  , ,  ,0  ,    CIWA:   N/A COWS:   N/A  Musculoskeletal: Strength & Muscle Tone: within normal limits Gait & Station: normal Patient leans: N/A  Psychiatric Specialty Exam:  Presentation  General Appearance:  Appropriate for Environment; Disheveled  Eye Contact: Fair  Speech: Clear and  Coherent  Speech Volume: Normal  Handedness: Right   Mood and Affect  Mood: Euthymic  Affect: Congruent   Thought Process  Thought Processes: Coherent  Descriptions of Associations:Intact  Orientation:Full (Time, Place and Person)  Thought Content:Logical; WDL  History of Schizophrenia/Schizoaffective disorder:Yes  Duration of Psychotic Symptoms:Greater than six months  Hallucinations:Hallucinations: None    Ideas of Reference:None  Suicidal Thoughts:Suicidal Thoughts: No    Homicidal Thoughts:Homicidal Thoughts: No     Sensorium  Memory: Immediate Fair  Judgment: Fair  Insight: Fair   Art therapist  Concentration: Fair  Attention Span: Fair  Recall: [employer] of Knowledge: Fair  Language: Fair   Psychomotor Activity  Psychomotor Activity: Psychomotor Activity: Normal     Assets  Assets: Resilience   Sleep  Sleep: Sleep: Good      Physical Exam: Physical Exam Vitals and nursing note reviewed.  Constitutional:      General: He is not in acute distress.    Appearance: He is normal weight. He is not ill-appearing.  Cardiovascular:      Rate and Rhythm: Normal rate.     Pulses: Normal pulses.  Neurological:     Mental Status: He is alert and oriented to person, place, and time.    Review of Systems  Constitutional: Negative.   Respiratory:  Negative for shortness of breath.   Cardiovascular:  Negative for chest pain and palpitations.  Gastrointestinal:  Negative for constipation, diarrhea, nausea and vomiting.  Musculoskeletal:  Negative for falls.  Skin: Negative.   Neurological:  Negative for dizziness, tingling, tremors and headaches.  Psychiatric/Behavioral:  Positive for depression, hallucinations and substance abuse. Negative for memory loss and suicidal ideas. The patient is nervous/anxious and has insomnia.    Blood pressure (!) 109/96, pulse 92, temperature 98.7 F (37.1 C), temperature source Oral, resp. rate 16, height 5\' 9"  (1.753 m), weight 55.3 kg, SpO2 99%. Body mass index is 18.02 kg/m.   Treatment Plan Summary: Daily contact with patient to assess and evaluate symptoms and progress in treatment and Medication management  Diagnoses / Active Problems: Principal Problem:   Schizoaffective disorder, bipolar type (HCC) Active Problems:   Uncontrolled type 1 diabetes mellitus with hyperglycemia, with long-term current use of insulin (HCC)              PLAN: Safety and Monitoring:             --  Voluntary admission to inpatient psychiatric unit for safety, stabilization and treatment             -- Daily contact with patient to assess and evaluate symptoms and progress in treatment             -- Patient's case to be discussed in multi-disciplinary team meeting             -- Observation Level : q15 minute checks             -- Vital signs:  q12 hours             -- Precautions: suicide, elopement, and assault   2. Psychiatric Diagnoses and Treatment:    # Schizoaffective disorder, bipolar type -Continue Haldol 5 mg BID for psychosis -Continue Cogentin 0.5 mg BID for EPS Prophylaxis --  Previously discontinued Risperdal 1 mg, 2 times daily on admission -- Discontinued Abilify 20 mg on 4/7 due to lack of efficacy     --  The risks/benefits/side-effects/alternatives to this medication were discussed in detail with  the patient and time was given for questions. The patient consents to medication trial.  -- FDA -- Metabolic profile and EKG monitoring obtained while on an atypical antipsychotic (BMI: Lipid Panel: HbgA1c: QTc:)    #Continue As Needed Medications --Hydroxyzine 25 mg oral, 3 times daily as needed, anxiety --Trazodone 50 mg, oral, daily at bedtime as needed, sleep   #Continue BH Agitation Protocol --Haldol 5 mg, oral, 3 times daily as needed, mild agitation --Benadryl 50 mg, oral, 3 times daily as needed, mild agitation                                     OR  --Haldol injection 5 mg, IM, 3 times daily as needed, moderate agitation --Benadryl injection 50 mg, IM, 3 times daily as needed, moderate agitation --Ativan injection 2 mg, IM, 3 times daily as needed, moderate agitation                                     OR --Haldol injection 10 mg, IM, 3 times daily as needed, severe agitation --Benadryl injection 50 mg, IM, 3 times daily as needed, severe agitation --Ativan injection 2 mg, IM, 3 times daily as needed, severe agitation     -- Encouraged patient to participate in unit milieu and in scheduled group therapies  -- Short Term Goals: Ability to identify changes in lifestyle to reduce recurrence of condition will improve, Ability to verbalize feelings will improve, Ability to disclose and discuss suicidal ideas, Ability to demonstrate self-control will improve, Ability to identify and develop effective coping behaviors will improve, Ability to maintain clinical measurements within normal limits will improve, Compliance with prescribed medications will improve, and Ability to identify triggers associated with substance abuse/mental health issues will improve --  Long Term Goals: Improvement in symptoms so as ready for discharge               3. Medical Issues Being Addressed:               # Uncontrolled type 1 diabetes with hyperglycemia --Referral to Diabetes Coordinator --Current orders for Inpatient glycemic control (goal CBG 140-180) Continue Semglee to 35 units daily Continue insulin aspart to 15 mg tid for mealtime converage Novolog 0-9 units, 3 times daily with meals NovoLog 0-5 units, daily at bedtime   # Vitamin D Deficiency -- Continue vitamin D 50,000 units, oral, every 7 days   #Tobacco Use Disorder             -- Declined Nicorette gum   -- Declined Nicotine patch              -- Smoking cessation encouraged   #Continue As Needed Medications --Tylenol 650 mg every 6 hours, as needed, mild pain, fever --Maalox/Mylanta 30 mL oral every 4 hours as needed, indigestion --Milk of Magnesia 30 mL oral daily as needed, mild constipation   4. Labs    CMP: Unremarkable TSH : Unremarkable CBC: Na+ 126 (given 1000 mL bolus sodium chloride in the ED) Hemoglobin A1c: Elevated at 14.8 Lipid Panel: Cholesterol slightly elevated at 201 and LDL at 109           UDS: 1/61/0960 at 1208 + Marijuana; 01/28/2024 at 1603 negative  UA: Colorless urine, glucose >=500, Ketones 20   EKG: QTc   428  5. Discharge Planning:              -- Social work and case management to assist with discharge planning and identification of hospital follow-up needs prior to discharge             -- Estimated LOS: 5-7 days             -- Discharge Concerns: Need to establish a safety plan; Medication compliance and effectiveness             -- Discharge Goals: Return home with outpatient referrals for mental health follow-up including medication management/psychotherapy     I certify that inpatient services furnished can reasonably be expected to improve the patient's condition.     Starleen Blue, NP 02/07/2024, 8:41 PM Patient ID: Jacob [name], male   DOB:  [birth year], 33 y.o.   MRN: [id number] Patient ID: Jacob [name], male   DOB: 1991/03/11, 33 y.o.   MRN: [id number]

## 2024-02-07 NOTE — Group Note (Signed)
 LCSW Group Therapy Note   Group Date: 02/07/2024 Start Time: 1100 End Time: 1200   Participation:  patient attended 50% of the group session.  He was respectful and listened, but didn't participate in the discussion.    Type of Therapy:  Group Therapy  Title:  "Shining from Within: Confidence and Self-Love Journey"  Objective:  The focus of today's session is to explore how confidence and self-love can be nurtured over time through self-compassion, recognizing strengths, and taking small, intentional steps.  Goals: Foster self-love and acceptance by embracing strengths and imperfections. Develop confidence through actionable steps and mindset shifts. Practice patience during personal growth, acknowledging setbacks as part of the journey.  Summary:  This session focused on self-love as the foundation for confidence.  Participants practiced helpful self-talk, identified strengths, set small goals, and reflected on achievements and social support.  It emphasized that building confidence is a continuous process requiring patience and self-care.  Therapeutic Modalities: Cognitive Behavioral Therapy (CBT): Used to challenge and replace unhelpful self-talk with more supportive thoughts, enhancing self-esteem. Mindfulness and Self-Compassion Practices: Encourages reflection on strengths, gratitude, and the creation of a positive environment to foster a sense of well-being.   Alla Feeling, LCSWA 02/07/2024  12:52 PM

## 2024-02-07 NOTE — Group Note (Unsigned)
 Date:  02/07/2024 Time:  10:16 AM  Group Topic/Focus:  Goals Group:   The focus of this group is to help patients establish daily goals to achieve during treatment and discuss how the patient can incorporate goal setting into their daily lives to aide in recovery.     Participation Level:  {BHH PARTICIPATION ZOXWR:60454}  Participation Quality:  {BHH PARTICIPATION QUALITY:22265}  Affect:  {BHH AFFECT:22266}  Cognitive:  {BHH COGNITIVE:22267}  Insight: {BHH Insight2:20797}  Engagement in Group:  {BHH ENGAGEMENT IN UJWJX:91478}  Modes of Intervention:  {BHH MODES OF INTERVENTION:22269}  Additional Comments:  ***  [name] 02/07/2024, 10:16 AM

## 2024-02-07 NOTE — Group Note (Unsigned)
 Date:  02/07/2024 Time:  9:54 AM  Group Topic/Focus:  Goals Group:   The focus of this group is to help patients establish daily goals to achieve during treatment and discuss how the patient can incorporate goal setting into their daily lives to aide in recovery.     Participation Level:  {BHH PARTICIPATION GNFAO:13086}  Participation Quality:  {BHH PARTICIPATION QUALITY:22265}  Affect:  {BHH AFFECT:22266}  Cognitive:  {BHH COGNITIVE:22267}  Insight: {BHH Insight2:20797}  Engagement in Group:  {BHH ENGAGEMENT IN VHQIO:96295}  Modes of Intervention:  {BHH MODES OF INTERVENTION:22269}  Additional Comments:  ***  Jacob [name] 02/07/2024, 9:54 AM

## 2024-02-07 NOTE — Group Note (Signed)
 Date:  02/07/2024 Time:  11:10 AM  Group Topic/Focus:  Goals Group:   The focus of this group is to help patients establish daily goals to achieve during treatment and discuss how the patient can incorporate goal setting into their daily lives to aide in recovery.    Participation Level:  Did Not Attend  Participation Quality:  Did Not Attend  Affect:  Did Not Attend  Cognitive:  Did Not Attend  Insight: None  Engagement in Group:  Did Not Attend  Modes of Intervention:  Did Not Attend  Additional Comments:  Did Not Attend  [name] 02/07/2024, 11:10 AM

## 2024-02-08 ENCOUNTER — Encounter (HOSPITAL_COMMUNITY): Payer: Self-pay

## 2024-02-08 DIAGNOSIS — F250 Schizoaffective disorder, bipolar type: Secondary | ICD-10-CM | POA: Diagnosis not present

## 2024-02-08 LAB — GLUCOSE, CAPILLARY
Glucose-Capillary: 181 mg/dL — ABNORMAL HIGH (ref 70–99)
Glucose-Capillary: 259 mg/dL — ABNORMAL HIGH (ref 70–99)
Glucose-Capillary: 247 mg/dL — ABNORMAL HIGH (ref 70–99)
Glucose-Capillary: 326 mg/dL — ABNORMAL HIGH (ref 70–99)
Glucose-Capillary: 239 mg/dL — ABNORMAL HIGH (ref 70–99)

## 2024-02-08 MED ORDER — HALOPERIDOL DECANOATE 100 MG/ML IM SOLN
100.0000 mg | INTRAMUSCULAR | Status: DC
  Administered 2024-02-08: 100 mg via INTRAMUSCULAR
  Filled 2024-02-08 (×2): qty 1

## 2024-02-08 NOTE — BH IP Treatment Plan (Signed)
 Interdisciplinary Treatment and Diagnostic Plan Update  02/08/2024 Time of Session: 02/08/24 - UPDATE Jacob [name] MRN: [id number]  Principal Diagnosis: Schizoaffective disorder, bipolar type (HCC)  Secondary Diagnoses: Principal Problem:   Schizoaffective disorder, bipolar type (HCC) Active Problems:   Tobacco dependence   Uncontrolled type 1 diabetes mellitus with hyperglycemia, with long-term current use of insulin (HCC)   Vitamin D deficiency   Current Medications:  Current Facility-Administered Medications  Medication Dose Route Frequency Provider Last Rate Last Admin   acetaminophen (TYLENOL) tablet 650 mg  650 mg Oral Q6H PRN Lenox Ponds, NP   650 mg at 02/07/24 2056   alum & mag hydroxide-simeth (MAALOX/MYLANTA) 200-200-20 MG/5ML suspension 30 mL  30 mL Oral Q4H PRN Lenox Ponds, NP       benztropine (COGENTIN) tablet 0.5 mg  0.5 mg Oral BID Starleen Blue, NP   0.5 mg at 02/08/24 0754   haloperidol (HALDOL) tablet 5 mg  5 mg Oral TID PRN Lenox Ponds, NP       And   diphenhydrAMINE (BENADRYL) capsule 50 mg  50 mg Oral TID PRN Lenox Ponds, NP       haloperidol lactate (HALDOL) injection 5 mg  5 mg Intramuscular TID PRN Lenox Ponds, NP       And   diphenhydrAMINE (BENADRYL) injection 50 mg  50 mg Intramuscular TID PRN Lenox Ponds, NP       And   LORazepam (ATIVAN) injection 2 mg  2 mg Intramuscular TID PRN Lenox Ponds, NP       haloperidol lactate (HALDOL) injection 10 mg  10 mg Intramuscular TID PRN Lenox Ponds, NP       And   diphenhydrAMINE (BENADRYL) injection 50 mg  50 mg Intramuscular TID PRN Lenox Ponds, NP       And   LORazepam (ATIVAN) injection 2 mg  2 mg Intramuscular TID PRN Lenox Ponds, NP       haloperidol (HALDOL) tablet 5 mg  5 mg Oral BID Starleen Blue, NP   5 mg at 02/08/24 0754   hydrOXYzine (ATARAX) tablet 25 mg  25 mg Oral TID PRN Willeen Cass, Christal H, NP   25 mg at 02/07/24 0149   insulin aspart (novoLOG)  injection 0-5 Units  0-5 Units Subcutaneous QHS Lenox Ponds, NP   2 Units at 02/03/24 2122   insulin aspart (novoLOG) injection 0-9 Units  0-9 Units Subcutaneous TID WC Lenox Ponds, NP   2 Units at 02/08/24 0640   insulin aspart (novoLOG) injection 15 Units  15 Units Subcutaneous TID Hinton Lovely, MD   15 Units at 02/08/24 4098   insulin glargine-yfgn (SEMGLEE) injection 35 Units  35 Units Subcutaneous Daily Park Pope, MD   35 Units at 02/08/24 0756   magnesium hydroxide (MILK OF MAGNESIA) suspension 30 mL  30 mL Oral Daily PRN Lenox Ponds, NP       traZODone (DESYREL) tablet 50 mg  50 mg Oral Seabron Spates, MD   50 mg at 02/07/24 2054   Vitamin D (Ergocalciferol) (DRISDOL) 1.25 MG (50000 UNIT) capsule 50,000 Units  50,000 Units Oral Q7 days Lenox Ponds, NP   50,000 Units at 02/06/24 1191   PTA Medications: Medications Prior to Admission  Medication Sig Dispense Refill Last Dose/Taking   ARIPiprazole (ABILIFY) 15 MG tablet Take 1 tablet (15 mg total) by mouth daily. 30 tablet 11    glucose blood (ACCU-CHEK GUIDE  TEST) test strip Use up to 3 strips a day when not wearing Continuous glucose monitor 50 each 12    insulin glargine (LANTUS) 100 UNIT/ML injection Inject 0.24 mLs (24 Units total) into the skin daily for 90 doses. 30 mL 0    insulin lispro (HUMALOG) 100 UNIT/ML KwikPen Inject 10 Units into the skin 3 (three) times daily before meals. 27 mL 0    Insulin Pen Needle (BD PEN NEEDLE NANO 2ND GEN) 32G X 4 MM MISC Use to inject insulin up to 4 times a day 200 each 3    Lancet Device MISC 1 each by Does not apply route 3 (three) times daily. May dispense any manufacturer covered by patient's insurance. 1 each 0    Vitamin D, Ergocalciferol, (DRISDOL) 1.25 MG (50000 UNIT) CAPS capsule Take 1 capsule (50,000 Units total) by mouth every 7 (seven) days. 4 capsule 1     Patient Stressors: Marital or family conflict   Other: Homeless    Patient Strengths: Physical Health   Supportive family/friends   Treatment Modalities: Medication Management, Group therapy, Case management,  1 to 1 session with clinician, Psychoeducation, Recreational therapy.   Physician Treatment Plan for Primary Diagnosis: Schizoaffective disorder, bipolar type (HCC) Long Term Goal(s):     Short Term Goals:    Medication Management: Evaluate patient's response, side effects, and tolerance of medication regimen.  Therapeutic Interventions: 1 to 1 sessions, Unit Group sessions and Medication administration.  Evaluation of Outcomes: Progressing  Physician Treatment Plan for Secondary Diagnosis: Principal Problem:   Schizoaffective disorder, bipolar type (HCC) Active Problems:   Tobacco dependence   Uncontrolled type 1 diabetes mellitus with hyperglycemia, with long-term current use of insulin (HCC)   Vitamin D deficiency  Long Term Goal(s):     Short Term Goals:       Medication Management: Evaluate patient's response, side effects, and tolerance of medication regimen.  Therapeutic Interventions: 1 to 1 sessions, Unit Group sessions and Medication administration.  Evaluation of Outcomes: Progressing   RN Treatment Plan for Primary Diagnosis: Schizoaffective disorder, bipolar type (HCC) Long Term Goal(s): Knowledge of disease and therapeutic regimen to maintain health will improve  Short Term Goals: Ability to demonstrate self-control, Ability to disclose and discuss suicidal ideas, and Compliance with prescribed medications will improve  Medication Management: RN will administer medications as ordered by provider, will assess and evaluate patient's response and provide education to patient for prescribed medication. RN will report any adverse and/or side effects to prescribing provider.  Therapeutic Interventions: 1 on 1 counseling sessions, Psychoeducation, Medication administration, Evaluate responses to treatment, Monitor vital signs and CBGs as ordered, Perform/monitor  CIWA, COWS, AIMS and Fall Risk screenings as ordered, Perform wound care treatments as ordered.  Evaluation of Outcomes: Progressing   LCSW Treatment Plan for Primary Diagnosis: Schizoaffective disorder, bipolar type (HCC) Long Term Goal(s): Safe transition to appropriate next level of care at discharge, Engage patient in therapeutic group addressing interpersonal concerns.  Short Term Goals: Engage patient in aftercare planning with referrals and resources, Increase emotional regulation, and Increase skills for wellness and recovery  Therapeutic Interventions: Assess for all discharge needs, 1 to 1 time with Social worker, Explore available resources and support systems, Assess for adequacy in community support network, Educate family and significant other(s) on suicide prevention, Complete Psychosocial Assessment, Interpersonal group therapy.  Evaluation of Outcomes: Progressing   Progress in Treatment: Attending groups: Yes. Participating in groups: Yes. Taking medication as prescribed: Yes. Toleration medication: Yes. Family/Significant other  contact made: Yes, contacted:  [name] (mother) [phone] Patient understands diagnosis: Yes. Discussing patient identified problems/goals with staff: Yes. Medical problems stabilized or resolved: Yes. Denies suicidal/homicidal ideation: Yes. Issues/concerns per patient self-inventory:  No.   New problem(s) identified: No   New Short Term/Long Term Goal(s):     medication stabilization, elimination of SI thoughts, development of comprehensive mental wellness plan.    Patient Goals:  "I have a brain freeze."  Patient hasn't been taking medications.   Discharge Plan or Barriers:  Patient recently admitted. CSW will continue to follow and assess for appropriate referrals and possible discharge planning.      Reason for Continuation of Hospitalization: Depression Hallucinations Medication stabilization   Estimated Length of Stay:   5 - 7 days Last 3 [place] Suicide Severity Risk Score: Flowsheet Row Ad[facility] (Current) [facility] Most recent reading at 02/02/2024  5:04 AM ED from 01/28/2024 in [facility] at [facility] Most recent reading at 01/28/2024  5:39 PM ED from 01/28/2024 in [facility] Most recent reading at 01/28/2024 12:35 PM  C-SSRS RISK CATEGORY No Risk No Risk No Risk       Last PHQ 2/9 Scores:    01/09/2024    3:33 PM 08/22/2023    1:55 PM 08/01/2023    8:46 AM  Depression screen PHQ 2/9  Decreased Interest 0 0 0  Down, Depressed, Hopeless 0 0 0  PHQ - 2 Score 0 0 0  Altered sleeping 0  0  Tired, decreased energy 0    Change in appetite 0  0  Feeling bad or failure about yourself  0  0  Trouble concentrating 0  0  Moving slowly or fidgety/restless 0  0  Suicidal thoughts 0  0  PHQ-9 Score 0  0  Difficult doing work/chores Not difficult at all      Scribe for Treatment Team: Jacinta Shoe, LCSW 02/08/2024 10:56 AM

## 2024-02-08 NOTE — Group Note (Signed)
 Date:  02/08/2024 Time:  8:58 PM  Group Topic/Focus:  Narcotics Anonymous (NA) Meeting    Participation Level:  Active  Participation Quality:  Appropriate  Affect:  Appropriate  Cognitive:  Appropriate  Insight: Appropriate  Engagement in Group:  Engaged  Modes of Intervention:  Socialization and Support  Additional Comments:  Patient attended NA Meeting   Jacob [name] 02/08/2024, 8:58 PM

## 2024-02-08 NOTE — Group Note (Signed)
 Date:  02/08/2024 Time:  9:24 AM  Group Topic/Focus:  Goals Group:   The focus of this group is to help patients establish daily goals to achieve during treatment and discuss how the patient can incorporate goal setting into their daily lives to aide in recovery.    Participation Level:  Active  Participation Quality:  Appropriate  Affect:  Appropriate   Erasmo Score 02/08/2024, 9:24 AM

## 2024-02-08 NOTE — BHH Group Notes (Signed)
 BHH Group Notes:  (Nursing/MHT/Case Management/Adjunct)  Date:  02/08/2024  Time:  12:12 AM  Type of Therapy:   Wrap-up group  Participation Level:  Did Not Attend  Participation Quality:    Affect:    Cognitive:    Insight:    Engagement in Group:    Modes of Intervention:    Summary of Progress/Problems: Didn't attend.   Noah Delaine 02/08/2024, 12:12 AM

## 2024-02-08 NOTE — Progress Notes (Signed)
 [facility] MD Progress Note  02/08/2024 10:52 PM [name]  MRN:  [id number] Principal Problem: Schizoaffective disorder, bipolar type (HCC) Diagnosis: Principal Problem:   Schizoaffective disorder, bipolar type (HCC) Active Problems:   Tobacco dependence   Uncontrolled type 1 diabetes mellitus with hyperglycemia, with long-term current use of insulin (HCC)   Vitamin D deficiency   History of Present Illness: Jacob [name], is a 33 year old male, with a significant psychiatric history of schizophrenia and medication noncompliance and medical history significant for poorly controlled type 1 diabetes.  On March 29, he presented to [facility] urgent care seeking housing and reported increased paranoia and auditory hallucinations.  He was subsequently transferred to [facility] emergency department due to hyperglycemia, with a recorded blood sugar level of 750.  After receiving medical clearance, he was admitted to [facility] on April 2 for further psychiatric care.   Chart review from last 24 hours: Chart reviewed today. Patient discussed at multidisciplinary team meeting. Low-grade fever of 100.5, vital signs otherwise within defined range. Documented sleep hours: 8.25. He is compliant with routine psychotropic medication regimen without difficulty. Required as needed hydroxyzine and Tylenol last night, according to the nursing record.  No acute concerns noted.   Daily Evaluation: On assessment today, the patient reports that his mood is euthymic, improved since admission, and stable. Denies feeling down, depressed, or sad. Reports that anxiety symptoms are at manageable level.  Sleep is stable. Appetite is stable.  Concentration is without complaint. Energy level is adequate. Denies having any suicidal thoughts. Denies having any suicidal intent and plan. Denies having any HI. Denies having psychotic symptoms.  Denies having side effects to current psychiatric  medications.   We discussed the benefits of a long-acting injectable antipsychotic.  He expressed understanding of its purpose, particularly given his prior history of noncompliance with oral medications, and provided consent to receive the injection today.  We also discussed options for insulin storage after discharge.  The patient expressed the desire to leave his insulin at the behavioral health center, though he acknowledged this is not feasible.  Alternative options were briefly reviewed.  He reported continued participation in group therapy sessions and identified his daily goal as improving his dietary habits.  Reinforced medication adherence and importance of follow-up care to mitigate the risk of symptom exacerbation; patient verbalized understanding.  The patient appears to be responding well to the current psychiatric medication regimen.  Due to a history of medication noncompliance, a long-acting injectable was administered today with the patient's informed consent.  The patient is participating in unit activities and demonstrating insight into treatment needs, as well as taking an active role in discharge planning. No EPS observed during the assessment.   The case was discussed with the attending psychiatrist, Z. Zouev the plan to initiate Haldol Decanoate 100 mg, IM, Every 30 days, First dose on 02/08/2024. The projected discharge date is set for Thursday, April 10  Recommendations initially made by diabetic consultant for patient's Semglee to be increased from 35 to 38 units nightly, due to blood glucose being in the 200s, but a review of patient's blood glucose flowsheets show that blood glucose has not consistently trended in the 200s. Reading from earlier today morning (fasting) was 162. A repeat was ordered to more accurately determine blood glucose control as current one of 14 may not have been fasting. That result is pending, and Semglee has been maintained at 35 units nightly, while  awaiting those results. Hemoglobin A1c results  pending   Total Time spent with patient: 45 minutes  Past Psychiatric History: See H&P  Past Medical History:  Past Medical History:  Diagnosis Date   Bipolar 1 disorder (HCC)    History of attempted suicide 2019   Unsuccessful suicide attempt in 2019 with rat poison   Schizoaffective disorder (HCC)    Type 1 diabetes mellitus on insulin therapy (HCC) 2019   Vitamin D deficiency 01/03/2024   History reviewed. No pertinent surgical history. Family History:  Family History  Problem Relation Age of Onset   Healthy Mother    Healthy Father    Family Psychiatric  History: See H&P Social History:  Social History   Substance and Sexual Activity  Alcohol Use Yes   Comment: social/occassional     Social History   Substance and Sexual Activity  Drug Use Not Currently   Types: Marijuana    Social History   Socioeconomic History   Marital status: Single    Spouse name: Not on file   Number of children: Not on file   Years of education: Not on file   Highest education level: Not on file  Occupational History   Not on file  Tobacco Use   Smoking status: Former    Types: Cigars, Cigarettes   Smokeless tobacco: Never   Tobacco comments:    2 BLACK AND MILD A DAY  Vaping Use   Vaping status: Every Day  Substance and Sexual Activity   Alcohol use: Yes    Comment: social/occassional   Drug use: Not Currently    Types: Marijuana   Sexual activity: Not on file  Other Topics Concern   Not on file  Social History Narrative   Not on file   Social Drivers of Health   Financial Resource Strain: Not on file  Food Insecurity: Food Insecurity Present (02/02/2024)   Hunger Vital Sign    Worried About Running Out of Food in the Last Year: Sometimes true    Ran Out of Food in the Last Year: Sometimes true  Transportation Needs: Unmet Transportation Needs (02/02/2024)   PRAPARE - Administrator, Civil Service (Medical):  Yes    Lack of Transportation (Non-Medical): No  Physical Activity: Not on file  Stress: Not on file  Social Connections: Socially Isolated (12/26/2023)   Social Connection and Isolation Panel [NHANES]    Frequency of Communication with Friends and Family: Never    Frequency of Social Gatherings with Friends and Family: Never    Attends Religious Services: Never    Database administrator or Organizations: No    Attends Engineer, structural: Never    Marital Status: Never married  Current Medications: Current Facility-Administered Medications  Medication Dose Route Frequency Provider Last Rate Last Admin   acetaminophen (TYLENOL) tablet 650 mg  650 mg Oral Q6H PRN Lenox Ponds, NP   650 mg at 02/08/24 1730   alum & mag hydroxide-simeth (MAALOX/MYLANTA) 200-200-20 MG/5ML suspension 30 mL  30 mL Oral Q4H PRN Lenox Ponds, NP       benztropine (COGENTIN) tablet 0.5 mg  0.5 mg Oral BID Nkwenti, Doris, NP   0.5 mg at 02/08/24 2117   haloperidol (HALDOL) tablet 5 mg  5 mg Oral TID PRN Lenox Ponds, NP       And   diphenhydrAMINE (BENADRYL) capsule 50 mg  50 mg Oral TID PRN Lenox Ponds, NP       haloperidol lactate (HALDOL) injection  5 mg  5 mg Intramuscular TID PRN Lenox Ponds, NP       And   diphenhydrAMINE (BENADRYL) injection 50 mg  50 mg Intramuscular TID PRN Lenox Ponds, NP       And   LORazepam (ATIVAN) injection 2 mg  2 mg Intramuscular TID PRN Lenox Ponds, NP       haloperidol lactate (HALDOL) injection 10 mg  10 mg Intramuscular TID PRN Lenox Ponds, NP       And   diphenhydrAMINE (BENADRYL) injection 50 mg  50 mg Intramuscular TID PRN Lenox Ponds, NP       And   LORazepam (ATIVAN) injection 2 mg  2 mg Intramuscular TID PRN Lenox Ponds, NP       haloperidol (HALDOL) tablet 5 mg  5 mg Oral BID Starleen Blue, NP   5 mg at 02/08/24 2117   haloperidol decanoate (HALDOL DECANOATE) 100 MG/ML injection 100 mg  100 mg Intramuscular Q30  days Magdiel Bartles H, NP   100 mg at 02/08/24 1702   hydrOXYzine (ATARAX) tablet 25 mg  25 mg Oral TID PRN Willeen Cass, Alicea Wente H, NP   25 mg at 02/07/24 0149   insulin aspart (novoLOG) injection 0-5 Units  0-5 Units Subcutaneous QHS Lenox Ponds, NP   2 Units at 02/08/24 2139   insulin aspart (novoLOG) injection 0-9 Units  0-9 Units Subcutaneous TID WC Lenox Ponds, NP   7 Units at 02/08/24 1658   insulin aspart (novoLOG) injection 15 Units  15 Units Subcutaneous TID Hinton Lovely, MD   15 Units at 02/08/24 1658   insulin glargine-yfgn (SEMGLEE) injection 35 Units  35 Units Subcutaneous Daily Park Pope, MD   35 Units at 02/08/24 0756   magnesium hydroxide (MILK OF MAGNESIA) suspension 30 mL  30 mL Oral Daily PRN Lenox Ponds, NP       traZODone (DESYREL) tablet 50 mg  50 mg Oral Seabron Spates, MD   50 mg at 02/08/24 2117   Vitamin D (Ergocalciferol) (DRISDOL) 1.25 MG (50000 UNIT) capsule 50,000 Units  50,000 Units Oral Q7 days Lenox Ponds, NP   50,000 Units at 02/06/24 7829    Lab Results:  Results for orders placed or performed during the hospital encounter of 02/02/24 (from the past 48 hours)  Glucose, capillary     Status: Abnormal   Collection Time: 02/07/24  5:55 AM  Result Value Ref Range   Glucose-Capillary 124 (H) 70 - 99 mg/dL    Comment: Glucose reference range applies only to samples taken after fasting for at least 8 hours.   Comment 1 Notify RN    Comment 2 Document in Chart   Glucose, capillary     Status: Abnormal   Collection Time: 02/07/24 11:52 AM  Result Value Ref Range   Glucose-Capillary 269 (H) 70 - 99 mg/dL    Comment: Glucose reference range applies only to samples taken after fasting for at least 8 hours.   Comment 1 Notify RN    Comment 2 Document in Chart   Glucose, capillary     Status: Abnormal   Collection Time: 02/07/24  5:02 PM  Result Value Ref Range   Glucose-Capillary 162 (H) 70 - 99 mg/dL    Comment: Glucose reference range  applies only to samples taken after fasting for at least 8 hours.   Comment 1 Notify RN    Comment 2 Document in Chart  Glucose, capillary     Status: None   Collection Time: 02/07/24  8:26 PM  Result Value Ref Range   Glucose-Capillary 79 70 - 99 mg/dL    Comment: Glucose reference range applies only to samples taken after fasting for at least 8 hours.   Comment 1 Notify RN   Glucose, capillary     Status: Abnormal   Collection Time: 02/08/24  5:55 AM  Result Value Ref Range   Glucose-Capillary 181 (H) 70 - 99 mg/dL    Comment: Glucose reference range applies only to samples taken after fasting for at least 8 hours.   Comment 1 Notify RN    Comment 2 Document in Chart   Glucose, capillary     Status: Abnormal   Collection Time: 02/08/24  7:53 AM  Result Value Ref Range   Glucose-Capillary 259 (H) 70 - 99 mg/dL    Comment: Glucose reference range applies only to samples taken after fasting for at least 8 hours.   Comment 1 Notify RN    Comment 2 Document in Chart   Glucose, capillary     Status: Abnormal   Collection Time: 02/08/24 11:49 AM  Result Value Ref Range   Glucose-Capillary 247 (H) 70 - 99 mg/dL    Comment: Glucose reference range applies only to samples taken after fasting for at least 8 hours.   Comment 1 Notify RN    Comment 2 Document in Chart   Glucose, capillary     Status: Abnormal   Collection Time: 02/08/24  4:47 PM  Result Value Ref Range   Glucose-Capillary 326 (H) 70 - 99 mg/dL    Comment: Glucose reference range applies only to samples taken after fasting for at least 8 hours.   Comment 1 Notify RN    Comment 2 Document in Chart   Glucose, capillary     Status: Abnormal   Collection Time: 02/08/24  9:15 PM  Result Value Ref Range   Glucose-Capillary 239 (H) 70 - 99 mg/dL    Comment: Glucose reference range applies only to samples taken after fasting for at least 8 hours.   Comment 1 Notify RN     Blood Alcohol level:  Lab Results  Component Value  Date   ETH <10 01/28/2024   ETH <10 01/28/2024    Metabolic Disorder Labs: Lab Results  Component Value Date   HGBA1C 14.8 (H) 01/28/2024   MPG 378.06 01/28/2024   MPG 280.48 10/23/2023   Lab Results  Component Value Date   PROLACTIN 18.7 12/27/2023   Lab Results  Component Value Date   CHOL 201 (H) 01/28/2024   TRIG 85 01/28/2024   HDL 75 01/28/2024   CHOLHDL 2.7 01/28/2024   VLDL 17 01/28/2024   LDLCALC 109 (H) 01/28/2024   LDLCALC 77 12/27/2023    Physical Findings: AIMS:  , ,  ,0  ,    CIWA:   N/A COWS:   N/A  Musculoskeletal: Strength & Muscle Tone: within normal limits Gait & Station: normal Patient leans: N/A  Psychiatric Specialty Exam:  Presentation  General Appearance:  Appropriate for Environment; Disheveled  Eye Contact: Good  Speech: Clear and Coherent  Speech Volume: Normal  Handedness: Right   Mood and Affect  Mood: Euthymic  Affect: Congruent   Thought Process  Thought Processes: Coherent; Goal Directed  Descriptions of Associations:Intact  Orientation:Full (Time, Place and Person)  Thought Content:Logical  History of Schizophrenia/Schizoaffective disorder:Yes  Duration of Psychotic Symptoms:Greater than six months  Hallucinations:Hallucinations:  None    Ideas of Reference:None  Suicidal Thoughts:Suicidal Thoughts: No    Homicidal Thoughts:Homicidal Thoughts: No     Sensorium  Memory: Immediate Fair; Recent Fair  Judgment: Fair  Insight: Fair   Chartered certified accountant: Fair  Attention Span: Fair  Recall: Fair  Fund of Knowledge: Fair  Language: Fair   Psychomotor Activity  Psychomotor Activity: Psychomotor Activity: Normal     Assets  Assets: Desire for Improvement; Resilience   Sleep  Sleep: Sleep: Good      Physical Exam: Physical Exam Vitals and nursing note reviewed.  Constitutional:      General: He is not in acute distress.    Appearance:  He is normal weight. He is not ill-appearing.  Cardiovascular:     Rate and Rhythm: Normal rate.     Pulses: Normal pulses.  Pulmonary:     Effort: No respiratory distress.  Neurological:     Mental Status: He is alert and oriented to person, place, and time.    Review of Systems  Constitutional: Negative.   Respiratory:  Negative for shortness of breath.   Cardiovascular:  Negative for chest pain and palpitations.  Gastrointestinal:  Negative for constipation, diarrhea, nausea and vomiting.  Musculoskeletal:  Negative for falls.  Skin: Negative.   Neurological:  Negative for dizziness, tingling, tremors and headaches.  Psychiatric/Behavioral:  Positive for depression, hallucinations and substance abuse. Negative for memory loss and suicidal ideas. The patient is nervous/anxious and has insomnia.    Blood pressure 126/86, pulse 88, temperature (!) 100.5 F (38.1 C), temperature source Oral, resp. rate 16, height 5\' 9"  (1.753 m), weight 55.3 kg, SpO2 99%. Body mass index is 18.02 kg/m.   Treatment Plan Summary: Daily contact with patient to assess and evaluate symptoms and progress in treatment and Medication management  Diagnoses / Active Problems: Principal Problem:   Schizoaffective disorder, bipolar type (HCC) Active Problems:   Uncontrolled type 1 diabetes mellitus with hyperglycemia, with long-term current use of insulin (HCC)              PLAN: Safety and Monitoring:             --  Voluntary admission to inpatient psychiatric unit for safety, stabilization and treatment             -- Daily contact with patient to assess and evaluate symptoms and progress in treatment             -- Patient's case to be discussed in multi-disciplinary team meeting             -- Observation Level : q15 minute checks             -- Vital signs:  q12 hours             -- Precautions: suicide, elopement, and assault   2. Psychiatric Diagnoses and Treatment:    # Schizoaffective  disorder, bipolar type -Initiate Haldol Decanoate 100 mg, IM, Every 30 days, First dose on 02/08/2024. -Continue Haldol 5 mg BID for psychosis -Continue Cogentin 0.5 mg BID for EPS Prophylaxis -- Previously discontinued Risperdal 1 mg, 2 times daily on admission -- Discontinued Abilify 20 mg on 4/7 due to lack of efficacy     --  The risks/benefits/side-effects/alternatives to this medication were discussed in detail with the patient and time was given for questions. The patient consents to medication trial.  -- FDA -- Metabolic profile and EKG monitoring obtained while on an atypical  antipsychotic (BMI: Lipid Panel: HbgA1c: QTc:)    #Continue As Needed Medications --Hydroxyzine 25 mg oral, 3 times daily as needed, anxiety --Trazodone 50 mg, oral, daily at bedtime as needed, sleep   #Continue BH Agitation Protocol --Haldol 5 mg, oral, 3 times daily as needed, mild agitation --Benadryl 50 mg, oral, 3 times daily as needed, mild agitation                                     OR  --Haldol injection 5 mg, IM, 3 times daily as needed, moderate agitation --Benadryl injection 50 mg, IM, 3 times daily as needed, moderate agitation --Ativan injection 2 mg, IM, 3 times daily as needed, moderate agitation                                     OR --Haldol injection 10 mg, IM, 3 times daily as needed, severe agitation --Benadryl injection 50 mg, IM, 3 times daily as needed, severe agitation --Ativan injection 2 mg, IM, 3 times daily as needed, severe agitation     -- Encouraged patient to participate in unit milieu and in scheduled group therapies  -- Short Term Goals: Ability to identify changes in lifestyle to reduce recurrence of condition will improve, Ability to verbalize feelings will improve, Ability to disclose and discuss suicidal ideas, Ability to demonstrate self-control will improve, Ability to identify and develop effective coping behaviors will improve, Ability to maintain clinical  measurements within normal limits will improve, Compliance with prescribed medications will improve, and Ability to identify triggers associated with substance abuse/mental health issues will improve -- Long Term Goals: Improvement in symptoms so as ready for discharge               3. Medical Issues Being Addressed:               # Uncontrolled type 1 diabetes with hyperglycemia --Referral to Diabetes Coordinator --Current orders for Inpatient glycemic control (goal CBG 140-180) Continue Semglee to 35 units daily Continue insulin aspart to 15 mg tid for mealtime converage Novolog 0-9 units, 3 times daily with meals NovoLog 0-5 units, daily at bedtime   # Vitamin D Deficiency -- Continue vitamin D 50,000 units, oral, every 7 days   #Tobacco Use Disorder             -- Declined Nicorette gum   -- Declined Nicotine patch              -- Smoking cessation encouraged   #Continue As Needed Medications --Tylenol 650 mg every 6 hours, as needed, mild pain, fever --Maalox/Mylanta 30 mL oral every 4 hours as needed, indigestion --Milk of Magnesia 30 mL oral daily as needed, mild constipation   4. Labs    CMP: Unremarkable TSH : Unremarkable CBC: Na+ 126 (given 1000 mL bolus sodium chloride in the ED) Hemoglobin A1c: Elevated at 14.8 Lipid Panel: Cholesterol slightly elevated at 201 and LDL at 109           UDS: 0/07/3817 at 1208 + Marijuana; 01/28/2024 at 1603 negative  UA: Colorless urine, glucose >=500, Ketones 20   EKG: QTc   428      5. Discharge Planning:              -- Social work and case management to assist with  discharge planning and identification of hospital follow-up needs prior to discharge             -- Estimated LOS: 5-7 days             -- Discharge Concerns: Need to establish a safety plan; Medication compliance and effectiveness             -- Discharge Goals: Return home with outpatient referrals for mental health follow-up including medication  management/psychotherapy     I certify that inpatient services furnished can reasonably be expected to improve the patient's condition.     Norma Fredrickson, NP 02/08/2024, 10:52 PM Patient ID: Jacob [name], male   DOB: 1990/12/27, 33 y.o.   MRN: [id number] Patient ID: Dimitrios Balestrieri, male   DOB: [birth year], 33 y.o.   MRN: [id number] Patient ID: Makhari Dovidio, male   DOB: December 26, 1990, 33 y.o.   MRN: [id number]

## 2024-02-08 NOTE — Progress Notes (Signed)
   02/08/24 0754  Psych Admission Type (Psych Patients Only)  Admission Status Voluntary  Psychosocial Assessment  Patient Complaints None  Eye Contact Fair  Facial Expression Animated  Affect Appropriate to circumstance  Speech Logical/coherent  Interaction Assertive  Motor Activity Other (Comment) (WDL)  Appearance/Hygiene Unremarkable  Behavior Characteristics Cooperative  Mood Pleasant  Thought Process  Coherency WDL  Content WDL  Delusions None reported or observed  Perception WDL  Hallucination None reported or observed  Judgment Poor  Confusion None  Danger to Self  Current suicidal ideation? Denies  Agreement Not to Harm Self Yes  Description of Agreement verbal  Danger to Others  Danger to Others None reported or observed

## 2024-02-08 NOTE — Plan of Care (Signed)
 ?  Problem: Education: ?Goal: Mental status will improve ?Outcome: Progressing ?Goal: Verbalization of understanding the information provided will improve ?Outcome: Progressing ?  ?

## 2024-02-08 NOTE — Group Note (Signed)
 Recreation Therapy Group Note   Group Topic:Team Building  Group Date: 02/08/2024 Start Time: 0935 End Time: 1000 Facilitators: Wendelin Bradt-McCall, LRT,CTRS Location: [address] Dayroom   Group Topic: Communication, Team Building, Problem Solving  Goal Area(s) Addresses:  Patient will effectively work with peer towards shared goal.  Patient will identify skills used to make activity successful.  Patient will identify how skills used during activity can be used to reach post d/c goals.   Intervention: STEM Activity  Activity: Straw Bridge. In teams of 3-5, patients were given 15 plastic drinking straws and an equal length of masking tape. Using the materials provided, patients were instructed to build a free standing bridge-like structure to suspend an everyday item (ex: puzzle box) off of the floor or table surface. All materials were required to be used by the team in their design. LRT facilitated post-activity discussion reviewing team process. Patients were encouraged to reflect how the skills used in this activity can be generalized to daily life post discharge.   Education: Pharmacist, community, Scientist, physiological, Discharge Planning   Education Outcome: Acknowledges education/In group clarification offered/Needs additional education.    Affect/Mood: Appropriate   Participation Level: Engaged   Participation Quality: Independent   Behavior: Appropriate   Speech/Thought Process: Relevant   Insight: Moderate   Judgement: Moderate   Modes of Intervention: STEM Activity   Patient Response to Interventions:  Receptive   Education Outcome:  In group clarification offered    Clinical Observations/Individualized Feedback: Pt offered suggestions and was attentive to what peers were offering. Pt didn't physically get involved but did communicate with them about what to do.     Plan: Continue to engage patient in RT group sessions 2-3x/week.   Jacob [name], LRT,CTRS   02/08/2024 12:25 PM

## 2024-02-09 DIAGNOSIS — F250 Schizoaffective disorder, bipolar type: Secondary | ICD-10-CM | POA: Diagnosis not present

## 2024-02-09 LAB — GLUCOSE, CAPILLARY
Glucose-Capillary: 269 mg/dL — ABNORMAL HIGH (ref 70–99)
Glucose-Capillary: 354 mg/dL — ABNORMAL HIGH (ref 70–99)

## 2024-02-09 LAB — HEMOGLOBIN A1C
Hgb A1c MFr Bld: 14.5 % — ABNORMAL HIGH (ref 4.8–5.6)
Mean Plasma Glucose: 369 mg/dL

## 2024-02-09 MED ORDER — HYDROXYZINE HCL 25 MG PO TABS: 25.0000 mg | ORAL_TABLET | Freq: Three times a day (TID) | ORAL | 0 refills | Status: DC | PRN

## 2024-02-09 MED ORDER — INSULIN GLARGINE-YFGN 100 UNIT/ML ~~LOC~~ SOLN: 35.0000 [IU] | Freq: Every day | SUBCUTANEOUS | 0 refills | Status: DC

## 2024-02-09 MED ORDER — HALOPERIDOL 5 MG PO TABS: 5.0000 mg | ORAL_TABLET | Freq: Two times a day (BID) | ORAL | 0 refills | Status: DC

## 2024-02-09 MED ORDER — TRAZODONE HCL 50 MG PO TABS: 50.0000 mg | ORAL_TABLET | Freq: Every day | ORAL | 0 refills | Status: DC

## 2024-02-09 MED ORDER — INSULIN ASPART 100 UNIT/ML IJ SOLN: 15.0000 [IU] | Freq: Three times a day (TID) | INTRAMUSCULAR | 0 refills | Status: DC

## 2024-02-09 MED ORDER — BENZTROPINE MESYLATE 0.5 MG PO TABS: 0.5000 mg | ORAL_TABLET | Freq: Two times a day (BID) | ORAL | 0 refills | Status: DC

## 2024-02-09 MED ORDER — VITAMIN D (ERGOCALCIFEROL) 1.25 MG (50000 UNIT) PO CAPS: 50000.0000 [IU] | ORAL_CAPSULE | ORAL | 0 refills | Status: DC

## 2024-02-09 MED ORDER — HALOPERIDOL DECANOATE 100 MG/ML IM SOLN: 100.0000 mg | INTRAMUSCULAR | 0 refills | Status: DC

## 2024-02-09 NOTE — Discharge Summary (Signed)
 Physician Discharge Summary Note  Patient:  Jacob [name] is an 33 y.o., male  MRN:  [id number]  DOB:  09/19/1991  Patient phone:  [phone] (home)   Patient address:   [address] [place] [address],   Total Time spent with patient: 1.5 hours  Date of Admission:  02/02/2024  Date of Discharge: 02-09-24  Reason for Admission: Increased paranoia and auditory hallucinations.   Principal Problem: Schizoaffective disorder, bipolar type [facility])  Discharge Diagnoses: Principal Problem:   Schizoaffective disorder, bipolar type (HCC) Active Problems:   Tobacco dependence   Uncontrolled type 1 diabetes mellitus with hyperglycemia, with long-term current use of insulin (HCC)   Vitamin D deficiency  Past Psychiatric History: See Above.  Past Medical History:  Past Medical History:  Diagnosis Date   Bipolar 1 disorder (HCC)    History of attempted suicide 2019   Unsuccessful suicide attempt in 2019 with rat poison   Schizoaffective disorder (HCC)    Type 1 diabetes mellitus on insulin therapy (HCC) 2019   Vitamin D deficiency 01/03/2024   History reviewed. No pertinent surgical history.  Family History:  Family History  Problem Relation Age of Onset   Healthy Mother    Healthy Father    Family Psychiatric  History: See H&P.  Social History:  Social History   Substance and Sexual Activity  Alcohol Use Yes   Comment: social/occassional     Social History   Substance and Sexual Activity  Drug Use Not Currently   Types: Marijuana    Social History   Socioeconomic History   Marital status: Single    Spouse name: Not on file   Number of children: Not on file   Years of education: Not on file   Highest education level: Not on file  Occupational History   Not on file  Tobacco Use   Smoking status: Former    Types: Cigars, Cigarettes   Smokeless tobacco: Never   Tobacco comments:    2 BLACK AND MILD A DAY  Vaping Use   Vaping status: Every  Day  Substance and Sexual Activity   Alcohol use: Yes    Comment: social/occassional   Drug use: Not Currently    Types: Marijuana   Sexual activity: Not on file  Other Topics Concern   Not on file  Social History Narrative   Not on file   Social Drivers of Health   Financial Resource Strain: Not on file  Food Insecurity: Food Insecurity Present (02/02/2024)   Hunger Vital Sign    Worried About Running Out of Food in the Last Year: Sometimes true    Ran Out of Food in the Last Year: Sometimes true  Transportation Needs: Unmet Transportation Needs (02/02/2024)   PRAPARE - Administrator, Civil Service (Medical): Yes    Lack of Transportation (Non-Medical): No  Physical Activity: Not on file  Stress: Not on file  Social Connections: Socially Isolated (12/26/2023)   Social Connection and Isolation Panel [NHANES]    Frequency of Communication with Friends and Family: Never    Frequency of Social Gatherings with Friends and Family: Never    Attends Religious Services: Never    Database administrator or Organizations: No    Attends Engineer, structural: Never    Marital Status: Never married   Hospital Course: (Per admission evaluation notes): 33 year old male, with a significant psychiatric history of schizophrenia and medication noncompliance. On March 29, he presented to [facility] urgent care seeking housing and reported increased paranoia and auditory hallucinations. He was subsequently transferred to [facility] emergency department due to hyperglycemia, with a recorded blood sugar level of 750. After receiving medical clearance, he was admitted to [facility] on April 2 for further psychiatric care.   Prior to this discharge, Enrique was seen & evaluated for mood stability. The current laboratory findings were reviewed (stable), nurses notes & vital signs were reviewed as well. There are no current mental health or medical issues  that should prevent this discharge at this time. Patient is being discharged to continue routine mental health care & medication management as noted above. as noted below.   After the above admission evaluation, Hunner's presenting symptoms were noted. He was recommended for mood stabilization treatments by his treatment team due to increased paranoia & auditory hallucinations, The medication regimen targeting those presenting symptoms were discussed with him & initiated with his consent. He was medicated, stabilized & discharged on the medications as listed on his discharge medication lists below. Besides the mood stabilization treatments, Keats was was also enrolled & participated in the group counseling sessions being offered & held on this unit. He learned coping skills. He presented other significant pre-existing medical issues that required treatment. He was resumed & discharged on all the pertinent medications used to treat & stabilize those medical issues. He tolerated his treatment regimen without any adverse effects or reactions reported. Jaremy's symptoms responded well to his treatment regimen warranting this discharge. Patient is also mentally/medically stable & agreeable to this discharge.  During the course of this this hospitalization, there were no instances of behavioral issues that required restraints or immediate intervention noted by staff. Patient remained safe on the unit. There were no instances of self-harming behaviors noted or reported by staff. There were no threats to other patients/staff. He remained cooperative to his daily routines & in taking his treatment regimen as recommended by his treatment team. He participated in the group sessions and interacted with staff and other patients appropriately.   Over the course of this hospitalization, patient's symptoms responded well to his treatment regimen & his mood has improved. On this day of his hospital discharge, patient denies  any thoughts of self-harm, suicidal/homicidal ideations, AVH, delusional thoughts or paranoia. He is not observed to be responding to internal any internal stimuli. He has been compliant with his recommended treatment regimen. He is currently showing readiness for discharge & is future-oriented thinking. Patient is encouraged to keep all his psychiatric appointments and continue with his medications as prescribed. He was able to engage in safety planning including plan to return to [facility] or contact emergency services if he feels unable to maintain his own safety or the safety of others. Pt had no further questions, comments, or concerns. Kenzie left [facility] with all personal belongings in no apparent distress. Transportation per his family (mother).  Physical Findings: AIMS:  , ,  ,  ,    CIWA:    COWS:     Musculoskeletal: Strength & Muscle Tone: within normal limits Gait & Station: normal Patient leans: N/A  Psychiatric Specialty Exam:  Presentation  General Appearance:  Appropriate for Environment; Disheveled  Eye Contact: Good  Speech: Clear and Coherent  Speech Volume: Normal  Handedness: Right   Mood and Affect  Mood: Euthymic  Affect: Congruent  Thought Process  Thought Processes: Coherent; Goal Directed  Descriptions of Associations:Intact  Orientation:Full (Time, Place and Person)  Thought Content:Logical  History  of Schizophrenia/Schizoaffective disorder:Yes  Duration of Psychotic Symptoms:Greater than six months  Hallucinations:Hallucinations: None  Ideas of Reference:None  Suicidal Thoughts:Suicidal Thoughts: No  Homicidal Thoughts:Homicidal Thoughts: No   Sensorium  Memory: Immediate Fair; Recent Fair  Judgment: Fair  Insight: Fair   Art therapist  Concentration: Fair  Attention Span: Fair  Recall: [employer] of Knowledge: Fair  Language: Fair   Psychomotor Activity  Psychomotor Activity: Psychomotor Activity:  Normal  Assets  Assets: Desire for Improvement; Resilience  Sleep  Sleep: Sleep: Good  Physical Exam: Physical Exam Vitals and nursing note reviewed.  HENT:     Head: Normocephalic.     Nose: Nose normal.  Cardiovascular:     Rate and Rhythm: Normal rate.     Pulses: Normal pulses.  Pulmonary:     Effort: Pulmonary effort is normal.  Genitourinary:    Comments: Deferred Musculoskeletal:        General: Normal range of motion.     Cervical back: Normal range of motion.  Skin:    General: Skin is dry.  Neurological:     General: No focal deficit present.     Mental Status: He is alert and oriented to person, place, and time. Mental status is at baseline.    Review of Systems  Constitutional:  Negative for chills, diaphoresis and fever.  HENT:  Negative for congestion and sore throat.   Respiratory:  Negative for cough, shortness of breath and wheezing.   Cardiovascular:  Negative for chest pain and palpitations.  Gastrointestinal:  Negative for abdominal pain, constipation, diarrhea, heartburn, nausea and vomiting.  Genitourinary:  Negative for dysuria.  Musculoskeletal:  Negative for joint pain and myalgias.  Neurological:  Negative for dizziness, tingling, tremors, sensory change, speech change, focal weakness, seizures, loss of consciousness, weakness and headaches.  Endo/Heme/Allergies:        NKDA.  Psychiatric/Behavioral:  Negative for depression, hallucinations (Hx. psychosis (stable on medication).), memory loss, substance abuse (Hx of THC use.) and suicidal ideas. The patient is not nervous/anxious and does not have insomnia.    Blood pressure 112/82, pulse 99, temperature 98.4 F (36.9 C), temperature source Oral, resp. rate 16, height 5\' 9"  (1.753 m), weight 55.3 kg, SpO2 99%. Body mass index is 18.02 kg/m.   Social History   Tobacco Use  Smoking Status Former   Types: Cigars, Cigarettes  Smokeless Tobacco Never  Tobacco Comments   2 BLACK AND MILD A  DAY   Tobacco Cessation:  N/A, patient does not currently use tobacco products  Blood Alcohol level:  Lab Results  Component Value Date   ETH <10 01/28/2024   ETH <10 01/28/2024    Metabolic Disorder Labs:  Lab Results  Component Value Date   HGBA1C 14.5 (H) 02/07/2024   MPG 369 02/07/2024   MPG 378.06 01/28/2024   Lab Results  Component Value Date   PROLACTIN 18.7 12/27/2023   Lab Results  Component Value Date   CHOL 201 (H) 01/28/2024   TRIG 85 01/28/2024   HDL 75 01/28/2024   CHOLHDL 2.7 01/28/2024   VLDL 17 01/28/2024   LDLCALC 109 (H) 01/28/2024   LDLCALC 77 12/27/2023   See Psychiatric Specialty Exam and Suicide Risk Assessment completed by Attending Physician prior to discharge.  Discharge destination:  Home  Is patient on multiple antipsychotic therapies at discharge:  No   Has Patient had three or more failed trials of antipsychotic monotherapy by history:  No  Recommended Plan for Multiple Antipsychotic Therapies: NA  Allergies as of 02/09/2024   No Known Allergies      Medication List     STOP taking these medications    ARIPiprazole 15 MG tablet Commonly known as: ABILIFY   insulin glargine 100 UNIT/ML injection Commonly known as: LANTUS Replaced by: insulin glargine-yfgn 100 UNIT/ML injection   insulin lispro 100 UNIT/ML KwikPen Commonly known as: HUMALOG Replaced by: insulin aspart 100 UNIT/ML injection       TAKE these medications      Indication  Accu-Chek Guide Test test strip Generic drug: glucose blood Use up to 3 strips a day when not wearing Continuous glucose monitor  Indication: DM1   benztropine 0.5 MG tablet Commonly known as: COGENTIN Take 1 tablet (0.5 mg total) by mouth 2 (two) times daily. For prevention of EPS  Indication: Extrapyramidal Reaction caused by Medications   haloperidol 5 MG tablet Commonly known as: HALDOL Take 1 tablet (5 mg total) by mouth 2 (two) times daily. For mood control  Indication:  Mood control   haloperidol decanoate 100 MG/ML injection Commonly known as: HALDOL DECANOATE Inject 1 mL (100 mg total) into the muscle every 30 (thirty) days. (Due on 03-09-24): For mood control Start taking on: Mar 09, 2024  Indication: Mood control   hydrOXYzine 25 MG tablet Commonly known as: ATARAX Take 1 tablet (25 mg total) by mouth 3 (three) times daily as needed for anxiety.  Indication: Feeling Anxious   insulin aspart 100 UNIT/ML injection Commonly known as: novoLOG Inject 15 Units into the skin 3 (three) times daily before meals. For diabetes control Replaces: insulin lispro 100 UNIT/ML KwikPen  Indication: Type 1 Diabetes   insulin glargine-yfgn 100 UNIT/ML injection Commonly known as: SEMGLEE Inject 0.35 mLs (35 Units total) into the skin daily. For diabetes control. Start taking on: February 10, 2024 Replaces: insulin glargine 100 UNIT/ML injection  Indication: Type 2 Diabetes   Insupen Pen Needles 32G X 4 MM Misc Generic drug: Insulin Pen Needle Use to inject insulin up to 4 times a day  Indication: DM1   Lancet Device Misc 1 each by Does not apply route 3 (three) times daily. May dispense any manufacturer covered by patient's insurance.  Indication: DM1   traZODone 50 MG tablet Commonly known as: DESYREL Take 1 tablet (50 mg total) by mouth at bedtime. For sleep  Indication: Trouble Sleeping   Vitamin D (Ergocalciferol) 1.25 MG (50000 UNIT) Caps capsule Commonly known as: DRISDOL Take 1 capsule (50,000 Units total) by mouth every 7 (seven) days. Start taking on: February 13, 2024  Indication: Vitamin D Deficiency        Follow-up Information     [facility]. Go on 02/09/2024.   Specialty: Behavioral Health Why: Please go to this provider for medication management and therapy services on 02/09/24 at 7:00 am.  You may also go Monday through Friday, arrive by 7:00 am for an assessment. Contact information: [address]  [place] [address] [phone]        [facility] Follow up.   Why: You have been accepted for ACTT services with this provider once discharged. You will receive therapy and medication management with them as well. Contact information: [address] [place] [address] [phone]                Follow-up recommendations:  Activity:  As tolerated Diet: As recommended by your primary care doctor. Keep all scheduled follow-up appointments as recommended.  Comments:  Comments: Patient is recommended to follow-up care on an outpatient basis as noted above. Prescriptions sent to pt's pharmacy of choice at discharge.   Patient agreeable to plan.   Given opportunity to ask questions.   Appears to feel comfortable with discharge denies any current suicidal or homicidal thought. Patient is also instructed prior to discharge to: Take all medications as prescribed by his/her mental healthcare provider. Report any adverse effects and or reactions from the medicines to his/her outpatient provider promptly. Patient has been instructed & cautioned: To not engage in alcohol and or illegal drug use while on prescription medicines. In the event of worsening symptoms, patient is instructed to call the crisis hotline, 911 and or go to the nearest ED for appropriate evaluation and treatment of symptoms. To follow-up with his/her primary care provider for your other medical issues, concerns and or health care needs.  Signed: Armandina Stammer, NP, pmhnp, fnp-bc. 02/09/2024, 11:32 AM

## 2024-02-09 NOTE — BHH Suicide Risk Assessment (Signed)
 Suicide Risk Assessment  Discharge Assessment    [facility] Discharge Suicide Risk Assessment   Principal Problem: Schizoaffective disorder, bipolar type [facility])  Discharge Diagnoses: Principal Problem:   Schizoaffective disorder, bipolar type (HCC) Active Problems:   Tobacco dependence   Uncontrolled type 1 diabetes mellitus with hyperglycemia, with long-term current use of insulin (HCC)   Vitamin D deficiency  Total Time spent with patient:  Greater than 30 minutes  Musculoskeletal: Strength & Muscle Tone: within normal limits Gait & Station: normal Patient leans: N/A  Psychiatric Specialty Exam  Presentation  General Appearance:  Appropriate for Environment; Casual; Disheveled  Eye Contact: Good  Speech: Clear and Coherent; Normal Rate  Speech Volume: Normal  Handedness: Right   Mood and Affect  Mood: Euthymic  Duration of Depression Symptoms: Greater than two weeks  Affect: Appropriate; Congruent   Thought Process  Thought Processes: Coherent; Goal Directed; Linear  Descriptions of Associations:Intact  Orientation:Full (Time, Place and Person)  Thought Content:Logical  History of Schizophrenia/Schizoaffective disorder:Yes  Duration of Psychotic Symptoms:Greater than six months  Hallucinations:Hallucinations: None Description of Auditory Hallucinations: Denies any AH.  Ideas of Reference:None  Suicidal Thoughts:Suicidal Thoughts: No  Homicidal Thoughts:Homicidal Thoughts: No   Sensorium  Memory: Immediate Good; Recent Good; Remote Good  Judgment: Good  Insight: Good   Executive Functions  Concentration: Good  Attention Span: Good  Recall: Good  Fund of Knowledge: Fair  Language: Good   Psychomotor Activity  Psychomotor Activity: Psychomotor Activity: Normal   Assets  Assets: Communication Skills; Desire for Improvement; Financial Resources/Insurance; Housing; Resilience; Social Support   Sleep  Sleep: Sleep:  Good Number of Hours of Sleep: 8.25   Physical Exam: See the discharge summary.  Blood pressure 112/82, pulse 99, temperature 98.4 F (36.9 C), temperature source Oral, resp. rate 16, height 5\' 9"  (1.753 m), weight 55.3 kg, SpO2 99%. Body mass index is 18.02 kg/m.  Mental Status Per Nursing Assessment::   On Admission:  NA  Demographic Factors:  Male, Adolescent or young adult, and Low socioeconomic status  Loss Factors: Financial problems/change in socioeconomic status  Historical Factors: NA  Risk Reduction Factors:   Sense of responsibility to family, Living with another person, especially a relative, Positive social support, Positive therapeutic relationship, and Positive coping skills or problem solving skills  Continued Clinical Symptoms:  Alcohol/Substance Abuse/Dependencies Previous Psychiatric Diagnoses and Treatments Medical Diagnoses and Treatments/Surgeries  Cognitive Features That Contribute To Risk:  Polarized thinking    Suicide Risk:  Minimal: No identifiable suicidal ideation.  Patients presenting with no risk factors but with morbid ruminations; may be classified as minimal risk based on the severity of the depressive symptoms   Follow-up Information     [facility]. Go on 02/09/2024.   Specialty: Behavioral Health Why: Please go to this provider for medication management and therapy services on 02/09/24 at 7:00 am.  You may also go Monday through Friday, arrive by 7:00 am for an assessment. Contact information: [address] [place] [address] [phone]        [facility] Follow up.   Why: You have been accepted for ACTT services with this provider once discharged. You will receive therapy and medication management with them as well. Contact information: [address] [place] [address] [phone]                Plan Of Care/Follow-up recommendations:  See the  discharge recommendations above.  Armandina Stammer, NP, pmhnp, fnp-bc  02/09/2024, 12:34 PM

## 2024-02-09 NOTE — Progress Notes (Signed)
   02/09/24 0000  Psych Admission Type (Psych Patients Only)  Admission Status Voluntary  Psychosocial Assessment  Patient Complaints None  Eye Contact Fair  Facial Expression Animated  Affect Appropriate to circumstance  Speech Logical/coherent  Interaction Assertive  Motor Activity Other (Comment) (Standard/Safety)  Appearance/Hygiene Unremarkable  Behavior Characteristics Cooperative  Mood Euthymic  Thought Process  Coherency WDL  Content WDL  Delusions None reported or observed  Perception WDL  Hallucination None reported or observed  Judgment Poor  Confusion None  Danger to Self  Current suicidal ideation? Denies  Description of Suicide Plan None  Agreement Not to Harm Self Yes  Description of Agreement Verbal  Danger to Others  Danger to Others None reported or observed

## 2024-02-09 NOTE — Plan of Care (Signed)
   Problem: Education: Goal: Knowledge of [facility] Education information/materials will improve Outcome: Progressing Goal: Emotional status will improve Outcome: Progressing Goal: Mental status will improve Outcome: Progressing Goal: Verbalization of understanding the information provided will improve Outcome: Progressing

## 2024-02-09 NOTE — Inpatient Diabetes Management (Signed)
 Inpatient Diabetes Program Recommendations  AACE/ADA: New Consensus Statement on Inpatient Glycemic Control  Target Ranges:  Prepandial:   less than 140 mg/dL      Peak postprandial:   less than 180 mg/dL (1-2 hours)      Critically ill patients:  140 - 180 mg/dL    Latest Reference Range & Units 02/08/24 07:53 02/08/24 11:49 02/08/24 16:47 02/08/24 21:15 02/09/24 06:08  Glucose-Capillary 70 - 99 mg/dL 161 (H) 096 (H) 045 (H) 239 (H) 269 (H)    Review of Glycemic Control  Diabetes history: DM2 Outpatient Diabetes medications: Lantus 24 units daily, Humalog 10 units TID with meals Current orders for Inpatient glycemic control: Semglee 35 units daily, Novolog 15 units TID with meals, Novolog 0-9 units TID with meals, Novolog 0-5 units QHS   Inpatient Diabetes Program Recommendations:     Insulin: Please consider increasing Semglee to 38 units daily.  Thanks, Orlando Penner, RN, MSN, CDCES Diabetes Coordinator Inpatient Diabetes Program [phone] (Team Pager from 8am to 5pm)

## 2024-02-09 NOTE — Progress Notes (Signed)
 Pt denies si, hi or avh at time of discharge. States that " I am feeling much better".  Pt given AVS along with follow up instructions and medication management instructions.  All belongings returned from locker 35. Pt verbalized understanding.  All pt's belongings were returned to him and he was escorted to lobby.  Pt left with his mother without incident.

## 2024-02-09 NOTE — Progress Notes (Signed)
  [facility] Adult Case Management Discharge Plan :  Will you be returning to the same living situation after discharge:  Yes,  pt will be going to shelter at discharge At discharge, do you have transportation home?: Yes,  pt will be picked up by mother at 1:00PM Do you have the ability to pay for your medications: Yes,  pt has active MCD  Release of information consent forms completed and in the chart;  Patient's signature needed at discharge.  Patient to Follow up at:  Follow-up Information     [facility]. Go on 02/09/2024.   Specialty: Behavioral Health Why: Please go to this provider for medication management and therapy services on 02/09/24 at 7:00 am.  You may also go Monday through Friday, arrive by 7:00 am for an assessment. Contact information: [address] [place] [address] [phone]        [facility] Follow up.   Why: You have been accepted for ACTT services with this provider once discharged. You will receive therapy and medication management with them as well. Contact information: [address] [place] [address] [phone]                 Next level of care provider has access to [facility] Link:no  Safety Planning and Suicide Prevention discussed: Yes,  mother, Jacob [name] [phone]     Has patient been referred to the Quitline?: Patient refused referral for treatment  Patient has been referred for addiction treatment: No known substance use disorder.  Kathi Der, LCSWA 02/09/2024, 11:57 AM

## 2024-02-14 ENCOUNTER — Ambulatory Visit: Payer: MEDICAID | Admitting: Student

## 2024-02-14 ENCOUNTER — Telehealth: Payer: Self-pay

## 2024-02-14 ENCOUNTER — Ambulatory Visit: Payer: MEDICAID | Admitting: Dietician

## 2024-02-14 VITALS — BP 136/74 | HR 90 | Temp 97.7°F | Ht 69.0 in | Wt 131.0 lb

## 2024-02-14 DIAGNOSIS — F250 Schizoaffective disorder, bipolar type: Secondary | ICD-10-CM | POA: Diagnosis not present

## 2024-02-14 DIAGNOSIS — E1065 Type 1 diabetes mellitus with hyperglycemia: Secondary | ICD-10-CM

## 2024-02-14 MED ORDER — DEXCOM G7 SENSOR MISC: 3 refills | Status: DC

## 2024-02-14 MED ORDER — BLOOD GLUCOSE MONITOR KIT: PACK | 0 refills | Status: DC

## 2024-02-14 MED ORDER — INSULIN GLARGINE-YFGN 100 UNIT/ML ~~LOC~~ SOLN
30.0000 [IU] | Freq: Every day | SUBCUTANEOUS | 0 refills | Status: DC
Start: 2024-02-14 — End: 2024-02-20

## 2024-02-14 MED ORDER — DEXCOM G7 RECEIVER DEVI: 1.0000 | Freq: Once | 3 refills | Status: AC

## 2024-02-14 MED ORDER — DEXCOM G7 RECEIVER DEVI: 1.0000 | Freq: Once | 3 refills | Status: DC

## 2024-02-14 MED ORDER — INSULIN ASPART 100 UNIT/ML IJ SOLN
7.0000 [IU] | Freq: Three times a day (TID) | INTRAMUSCULAR | 0 refills | Status: DC
Start: 2024-02-14 — End: 2024-02-20

## 2024-02-14 MED ORDER — BLOOD GLUCOSE MONITOR SYSTEM W/DEVICE KIT: 1.0000 | PACK | Freq: Once | 0 refills | Status: DC

## 2024-02-14 NOTE — Patient Instructions (Addendum)
 Thank you, [name] for allowing us  to provide your care today. Today we discussed diabetes and your recent hospitalization.    Please call the following numbers for Additional resources: [facility] Tailored plan: [phone]: call for additional resources for medications and housing  2. [facility] Specialty: Behavioral Health Why: Please go to this provider for medication management and therapy services on 02/09/24 at 7:00 am.  You may also go Monday through Friday, arrive by 7:00 am for an assessment. Contact information: [address] [place]  [address] [phone] 3.[facility], [facility] Follow up.  Why: You have been accepted for ACTT services with this provider once discharged. You will receive therapy and medication management with them as well. Contact information: [address] [place] [address] [phone]   Follow up: 1 month    Remember: To use the dexcom and check your blood sugar BEFORE using insulin   Should you have any questions or concerns please call the internal medicine clinic at [phone].     Please note that our late policy has changed.  If you are more than 15 minutes late to your appointment, you may be asked to reschedule your appointment.  Dr. Sharlon Deacon, D.O. [facility]

## 2024-02-14 NOTE — Assessment & Plan Note (Signed)
 Patient presented to [facility] behavioral health urgent care on March 29 seeking housing and reported paranoia and hallucinations.  A blood glucose check was elevated above 700 and he was sent to [facility] emergency department for hypoglycemia treatment.  On April 3, he was sent to inpatient psychiatry for treatment of his increased paranoia and auditory hallucinations due to his schizoaffective disorder bipolar type.  He was discharged on April 10.  He has yet to follow-up with psychiatry, he was established with the ACTT team for management and treatment of his psychiatric condition.  Medication changes after his hospitalization: Started monthly haloperidol injection and discontinued Abilify.  He denies suicidal or homicidal ideations he denies auditory or visual hallucinations at this moment. Plan: - Patient was provided with phone numbers for his [facility] tailored for additional resources and housing information -Provided patient with phone numbers to establish care with [facility] behavioral health

## 2024-02-14 NOTE — Progress Notes (Signed)
 Diabetes Self Management Education & Support Start time: 1115 AM End time: 1135 AM Gave patient sample Dexcom G7 CGM sensor and receiver as well as a lanyard to attach to the receiver to help him  keep up with it. Jacob [name] would not let his mother apply the sensor to the back of his arm. He applied it and started it with the receiver without problems. He also set up the receiver without problems and independently. I encourage Jacob [name] to take his insulin as directed and to apply a sensor every 10 days and start it.  Marlene Simas, RD 02/14/2024 11:45 AM.

## 2024-02-14 NOTE — Telephone Encounter (Signed)
 Prior Authorization for patient [facility] G7 Receiver device) came through on cover my meds was submitted with last office notes and labs awaiting approval or denial.   [id number]

## 2024-02-14 NOTE — Assessment & Plan Note (Addendum)
 Patient presents with a history of T1DM with a prior A1c of 14.5 in April 2025.  They are on a regimen of Lantus 30 units and Novolog 7 units with meals.  Patient denies hypoglycemia.  Patient does not have a CGM and does not check BG, has no supplies.  He reports that he has not eaten since last Thursday but takes his insulin every day.  Patient also reports that he takes his insulin daily without checking his blood glucose.  And during his recent hospitalization, he was treated for hyperglycemia without DKA.  Plan: -Continue regimen of Lantus 30 units and NovoLog 7 units with meals, patient does not have data to make insulin regimen changes at this appointment -A1c UTD -Urine ACR UTD - Patient is meeting with Abe Abed after this appointment to begin use of CGM. -Dexcom 7 sensor and receiver ordered

## 2024-02-14 NOTE — Progress Notes (Signed)
 Internal Medicine Clinic Attending  Case discussed with the resident at the time of the visit.  We reviewed the resident's history and exam and pertinent patient test results.  I agree with the assessment, diagnosis, and plan of care documented in the resident's note. Will connect him with [facility] to get a case manager to help patient with outpatient resources like housing, financial.  Encouraged Fu with psychiatry, will continue working on DM in conjunction with nutritionist/diabetes educator.

## 2024-02-14 NOTE — Progress Notes (Signed)
 Established Patient Office Visit  Subjective   Patient ID: Jacob [name], male    DOB: 04/04/1991  Age: 33 y.o. MRN: [id number]  Chief Complaint  Patient presents with   Follow-up    Routine office follow up visit / dm follow up / medication refill    Jacob [name] is a 33 y.o. who presents to the clinic for a one month follow up of uncontrolled T1DM and hospitalization follow up of increased paranoia and hallucinations. Patient presented to [facility] behavioral health urgent care on March 29 seeking housing and reported paranoia and hallucinations.  A blood glucose check was elevated above 700 and he was sent to [facility] emergency department for hypoglycemia treatment.  On April 3, he was sent to inpatient psychiatry for treatment of his increased paranoia and auditory hallucinations due to his schizoaffective disorder bipolar type.  He was discharged on April 10.  He has yet to follow-up with psychiatry, he was established with the ACTT team for management and treatment of his psychiatric condition.  Medication changes after his hospitalization: Started monthly haloperidol injection and discontinued Abilify.  He denies suicidal or homicidal ideations he denies auditory or visual hallucinations at this moment. Please see problem based assessment and plan for additional details.   Patient Active Problem List   Diagnosis Date Noted   Vitamin D deficiency 01/10/2024   Malingering 12/27/2023   Psychosis (HCC) 12/26/2023   Homeless 12/17/2023   AKI (acute kidney injury) (HCC) 12/17/2023   Homelessness 12/17/2023   Influenza A 11/30/2023   Uncontrolled type 1 diabetes mellitus with hyperglycemia, with long-term current use of insulin (HCC) 10/10/2023   Blister (nonthermal), right foot, initial encounter 08/01/2023   Generalized anxiety disorder 08/31/2022   Tobacco dependence 08/31/2022   Schizoaffective disorder, bipolar type (HCC) 12/03/2021   Uncontrolled type 1 diabetes mellitus with  hypoglycemia without coma (HCC) 06/06/2020   Polycythemia 03/04/2020   MDD (major depressive disorder), recurrent, severe, with psychosis (HCC)        Objective:     BP 136/74 (BP Location: Left Arm, Patient Position: Sitting, Cuff Size: Normal)   Pulse 90   Temp 97.7 F (36.5 C) (Oral)   Ht 5\' 9"  (1.753 m)   Wt 131 lb (59.4 kg)   SpO2 100%   BMI 19.35 kg/m  BP Readings from Last 3 Encounters:  02/14/24 136/74  02/09/24 112/82  02/01/24 102/74   Wt Readings from Last 3 Encounters:  02/14/24 131 lb (59.4 kg)  02/02/24 122 lb (55.3 kg)  01/28/24 132 lb 0.9 oz (59.9 kg)      Physical Exam Vitals reviewed.  Constitutional:      General: He is not in acute distress.    Appearance: He is not ill-appearing, toxic-appearing or diaphoretic.  Cardiovascular:     Rate and Rhythm: Normal rate and regular rhythm.     Heart sounds: Normal heart sounds. No murmur heard. Pulmonary:     Effort: Pulmonary effort is normal.  Skin:    General: Skin is warm and dry.  Neurological:     Mental Status: He is alert.  Psychiatric:        Attention and Perception: He is inattentive. He does not perceive auditory or visual hallucinations.        Speech: Speech is not rapid and pressured or slurred.        Behavior: Behavior is agitated and hyperactive.        Thought Content: Thought content does not include homicidal or  suicidal ideation. Thought content does not include homicidal or suicidal plan.     Comments: Patient is pacing around the room, arguing with his mother.       No results found for any visits on 02/14/24.  Last metabolic panel Lab Results  Component Value Date   GLUCOSE 218 (H) 02/06/2024   NA 137 02/06/2024   K 4.0 02/06/2024   CL 103 02/06/2024   CO2 27 02/06/2024   BUN 11 02/06/2024   CREATININE 0.49 (L) 02/06/2024   GFRNONAA >60 02/06/2024   CALCIUM 9.0 02/06/2024   PROT 6.1 (L) 01/28/2024   ALBUMIN 3.5 01/28/2024   BILITOT 1.1 01/28/2024   ALKPHOS 68  01/28/2024   AST 27 01/28/2024   ALT 30 01/28/2024   ANIONGAP 7 02/06/2024   Last lipids Lab Results  Component Value Date   CHOL 201 (H) 01/28/2024   HDL 75 01/28/2024   LDLCALC 109 (H) 01/28/2024   TRIG 85 01/28/2024   CHOLHDL 2.7 01/28/2024   Last hemoglobin A1c Lab Results  Component Value Date   HGBA1C 14.5 (H) 02/07/2024      The ASCVD Risk score (Arnett DK, et al., 2019) failed to calculate for the following reasons:   The 2019 ASCVD risk score is only valid for ages 54 to 20    Assessment & Plan:   Problem List Items Addressed This Visit       Endocrine   Uncontrolled type 1 diabetes mellitus with hyperglycemia, with long-term current use of insulin (HCC) - Primary (Chronic)   Patient presents with a history of T1DM with a prior A1c of 14.5 in April 2025.  They are on a regimen of Lantus 30 units and Novolog 7 units with meals.  Patient denies hypoglycemia.  Patient does not have a CGM and does not check BG, has no supplies.  He reports that he has not eaten since last Thursday but takes his insulin every day.  Patient also reports that he takes his insulin daily without checking his blood glucose.  And during his recent hospitalization, he was treated for hyperglycemia without DKA.  Plan: -Continue regimen of Lantus 30 units and NovoLog 7 units with meals, patient does not have data to make insulin regimen changes at this appointment -A1c UTD -Urine ACR UTD - Patient is meeting with Abe Abed after this appointment to begin use of CGM. -Dexcom 7 sensor and receiver ordered      Relevant Medications   insulin aspart (NOVOLOG) 100 UNIT/ML injection   insulin glargine-yfgn (SEMGLEE) 100 UNIT/ML injection     Other   Schizoaffective disorder, bipolar type [facility])   Patient presented to [facility] behavioral health urgent care on March 29 seeking housing and reported paranoia and hallucinations.  A blood glucose check was elevated above 700 and he was sent to [facility]  emergency department for hypoglycemia treatment.  On April 3, he was sent to inpatient psychiatry for treatment of his increased paranoia and auditory hallucinations due to his schizoaffective disorder bipolar type.  He was discharged on April 10.  He has yet to follow-up with psychiatry, he was established with the ACTT team for management and treatment of his psychiatric condition.  Medication changes after his hospitalization: Started monthly haloperidol injection and discontinued Abilify.  He denies suicidal or homicidal ideations he denies auditory or visual hallucinations at this moment. Plan: - Patient was provided with phone numbers for his [facility] tailored for additional resources and housing information -Provided patient with phone numbers to  establish care with [facility] behavioral health       Return in about 4 weeks (around 03/13/2024) for T1DM.    Aurora Lees, DO

## 2024-02-15 ENCOUNTER — Emergency Department (HOSPITAL_COMMUNITY)
Admission: EM | Admit: 2024-02-15 | Discharge: 2024-02-16 | Discharge status: Against Medical Advice | Payer: MEDICAID | Attending: Emergency Medicine | Admitting: Emergency Medicine

## 2024-02-15 ENCOUNTER — Telehealth: Payer: Self-pay | Admitting: Student

## 2024-02-15 ENCOUNTER — Other Ambulatory Visit: Payer: Self-pay

## 2024-02-15 ENCOUNTER — Encounter (HOSPITAL_COMMUNITY): Payer: Self-pay | Admitting: Emergency Medicine

## 2024-02-15 DIAGNOSIS — E162 Hypoglycemia, unspecified: Secondary | ICD-10-CM | POA: Insufficient documentation

## 2024-02-15 DIAGNOSIS — F250 Schizoaffective disorder, bipolar type: Secondary | ICD-10-CM

## 2024-02-15 DIAGNOSIS — Z5321 Procedure and treatment not carried out due to patient leaving prior to being seen by health care provider: Secondary | ICD-10-CM | POA: Insufficient documentation

## 2024-02-15 LAB — CBG MONITORING, ED: Glucose-Capillary: 137 mg/dL — ABNORMAL HIGH (ref 70–99)

## 2024-02-15 NOTE — ED Triage Notes (Signed)
  Patient comes in with stated hypoglycemia.  CBG was checked during triage and shown to be 137.  Patient denies any other symptoms.  Requesting something to eat and he will leave.  No pain at this time.

## 2024-02-15 NOTE — Telephone Encounter (Signed)
 The pt's mother "Adriana Hopping" came to the [facility] clinic at closing time today requesting a Referral for the patient's injections. A new Referral will be need to be placed with Behavioral health for his  Haloperidol injections and follow up.  Per yesterday's OV the info was provided to the patient in which they have contacted their office but a New Referral will need to be placed before he can be sch   Please place a new Referral to be sent.

## 2024-02-16 ENCOUNTER — Telehealth: Payer: Self-pay | Admitting: Internal Medicine

## 2024-02-16 ENCOUNTER — Other Ambulatory Visit (HOSPITAL_COMMUNITY): Payer: Self-pay

## 2024-02-16 NOTE — Telephone Encounter (Signed)
 Please see prev message sent to Provider on 02/15/2024.  Waiting for a response from the provider.  Copied from CRM [id number]. Topic: Referral - Request for Referral >> Feb 15, 2024  4:52 PM Karole Pacer C wrote: Did the patient discuss referral with their provider in the last year? Yes (If No - schedule appointment) (If Yes - send message)  Appointment offered? No  Type of order/referral and detailed reason for visit: Behavioral specialist  Preference of office, provider, location: [facility]  If referral order, have you been seen by this specialty before? Yes (If Yes, this issue or another issue? When? Where?  Can we respond through MyChart? No

## 2024-02-16 NOTE — ED Notes (Signed)
 Paitent did not respond to his name. I called his name 3 times.

## 2024-02-16 NOTE — Telephone Encounter (Signed)
 Pharmacy Patient Advocate Encounter   RESUBMITTED  PA required; PA submitted to above mentioned insurance via CoverMyMeds Key/confirmation #/EOC [id number]. Status is pending

## 2024-02-18 ENCOUNTER — Inpatient Hospital Stay (HOSPITAL_COMMUNITY): Payer: MEDICAID

## 2024-02-18 ENCOUNTER — Other Ambulatory Visit: Payer: Self-pay

## 2024-02-18 ENCOUNTER — Encounter (HOSPITAL_COMMUNITY): Payer: Self-pay

## 2024-02-18 ENCOUNTER — Inpatient Hospital Stay (HOSPITAL_COMMUNITY)
Admission: EM | Admit: 2024-02-18 | Discharge: 2024-02-20 | DRG: 638 | Disposition: A | Discharge status: Home / Self Care | Payer: MEDICAID | Attending: Internal Medicine | Admitting: Internal Medicine

## 2024-02-18 DIAGNOSIS — E869 Volume depletion, unspecified: Secondary | ICD-10-CM | POA: Diagnosis not present

## 2024-02-18 DIAGNOSIS — Z87891 Personal history of nicotine dependence: Secondary | ICD-10-CM | POA: Diagnosis not present

## 2024-02-18 DIAGNOSIS — Z79899 Other long term (current) drug therapy: Secondary | ICD-10-CM

## 2024-02-18 DIAGNOSIS — Z794 Long term (current) use of insulin: Secondary | ICD-10-CM | POA: Diagnosis not present

## 2024-02-18 DIAGNOSIS — G9341 Metabolic encephalopathy: Secondary | ICD-10-CM | POA: Diagnosis present

## 2024-02-18 DIAGNOSIS — F250 Schizoaffective disorder, bipolar type: Secondary | ICD-10-CM | POA: Diagnosis present

## 2024-02-18 DIAGNOSIS — Z5900 Homelessness unspecified: Secondary | ICD-10-CM

## 2024-02-18 DIAGNOSIS — E1110 Type 2 diabetes mellitus with ketoacidosis without coma: Secondary | ICD-10-CM | POA: Diagnosis not present

## 2024-02-18 DIAGNOSIS — E870 Hyperosmolality and hypernatremia: Secondary | ICD-10-CM | POA: Diagnosis present

## 2024-02-18 DIAGNOSIS — E875 Hyperkalemia: Secondary | ICD-10-CM | POA: Diagnosis present

## 2024-02-18 DIAGNOSIS — Z720 Tobacco use: Secondary | ICD-10-CM | POA: Insufficient documentation

## 2024-02-18 DIAGNOSIS — E46 Unspecified protein-calorie malnutrition: Secondary | ICD-10-CM | POA: Diagnosis not present

## 2024-02-18 DIAGNOSIS — N179 Acute kidney failure, unspecified: Secondary | ICD-10-CM | POA: Diagnosis not present

## 2024-02-18 DIAGNOSIS — Z9151 Personal history of suicidal behavior: Secondary | ICD-10-CM

## 2024-02-18 DIAGNOSIS — F209 Schizophrenia, unspecified: Secondary | ICD-10-CM | POA: Diagnosis not present

## 2024-02-18 DIAGNOSIS — I7389 Other specified peripheral vascular diseases: Secondary | ICD-10-CM | POA: Diagnosis present

## 2024-02-18 DIAGNOSIS — E1011 Type 1 diabetes mellitus with ketoacidosis with coma: Secondary | ICD-10-CM | POA: Diagnosis not present

## 2024-02-18 DIAGNOSIS — R451 Restlessness and agitation: Secondary | ICD-10-CM | POA: Diagnosis present

## 2024-02-18 DIAGNOSIS — G9349 Other encephalopathy: Secondary | ICD-10-CM | POA: Diagnosis present

## 2024-02-18 DIAGNOSIS — E1142 Type 2 diabetes mellitus with diabetic polyneuropathy: Secondary | ICD-10-CM | POA: Insufficient documentation

## 2024-02-18 DIAGNOSIS — E109 Type 1 diabetes mellitus without complications: Secondary | ICD-10-CM

## 2024-02-18 DIAGNOSIS — E1065 Type 1 diabetes mellitus with hyperglycemia: Secondary | ICD-10-CM

## 2024-02-18 LAB — OSMOLALITY: Osmolality: 398 mosm/kg (ref 275–295)

## 2024-02-18 LAB — GLUCOSE, CAPILLARY
Glucose-Capillary: 178 mg/dL — ABNORMAL HIGH (ref 70–99)
Glucose-Capillary: 234 mg/dL — ABNORMAL HIGH (ref 70–99)
Glucose-Capillary: 271 mg/dL — ABNORMAL HIGH (ref 70–99)
Glucose-Capillary: 288 mg/dL — ABNORMAL HIGH (ref 70–99)
Glucose-Capillary: 322 mg/dL — ABNORMAL HIGH (ref 70–99)
Glucose-Capillary: 382 mg/dL — ABNORMAL HIGH (ref 70–99)
Glucose-Capillary: 431 mg/dL — ABNORMAL HIGH (ref 70–99)

## 2024-02-18 LAB — HEPATIC FUNCTION PANEL
ALT: 43 U/L (ref 0–44)
AST: 33 U/L (ref 15–41)
Albumin: 4.7 g/dL (ref 3.5–5.0)
Alkaline Phosphatase: 97 U/L (ref 38–126)
Bilirubin, Direct: 0.1 mg/dL (ref 0.0–0.2)
Indirect Bilirubin: 2.6 mg/dL — ABNORMAL HIGH (ref 0.3–0.9)
Total Bilirubin: 2.7 mg/dL — ABNORMAL HIGH (ref 0.0–1.2)
Total Protein: 9 g/dL — ABNORMAL HIGH (ref 6.5–8.1)

## 2024-02-18 LAB — I-STAT CHEM 8, ED
BUN: 42 mg/dL — ABNORMAL HIGH (ref 6–20)
Calcium, Ion: 1.22 mmol/L (ref 1.15–1.40)
Chloride: 99 mmol/L (ref 98–111)
Creatinine, Ser: 2 mg/dL — ABNORMAL HIGH (ref 0.61–1.24)
Glucose, Bld: >700 mg/dL (ref 70–99)
HCT: 53 % — ABNORMAL HIGH (ref 39.0–52.0)
Hemoglobin: 18 g/dL — ABNORMAL HIGH (ref 13.0–17.0)
Potassium: 8.2 mmol/L (ref 3.5–5.1)
Sodium: 129 mmol/L — ABNORMAL LOW (ref 135–145)
TCO2: 11 mmol/L — ABNORMAL LOW (ref 22–32)

## 2024-02-18 LAB — BASIC METABOLIC PANEL WITH GFR
Anion gap: 31 — ABNORMAL HIGH (ref 5–15)
Anion gap: 28 — ABNORMAL HIGH (ref 5–15)
Anion gap: 17 — ABNORMAL HIGH (ref 5–15)
BUN: 38 mg/dL — ABNORMAL HIGH (ref 6–20)
BUN: 36 mg/dL — ABNORMAL HIGH (ref 6–20)
BUN: 32 mg/dL — ABNORMAL HIGH (ref 6–20)
CO2: 11 mmol/L — ABNORMAL LOW (ref 22–32)
CO2: 18 mmol/L — ABNORMAL LOW (ref 22–32)
CO2: 28 mmol/L (ref 22–32)
Calcium: 11.5 mg/dL — ABNORMAL HIGH (ref 8.9–10.3)
Calcium: 12.3 mg/dL — ABNORMAL HIGH (ref 8.9–10.3)
Calcium: 12 mg/dL — ABNORMAL HIGH (ref 8.9–10.3)
Chloride: 97 mmol/L — ABNORMAL LOW (ref 98–111)
Chloride: 106 mmol/L (ref 98–111)
Chloride: 110 mmol/L (ref 98–111)
Creatinine, Ser: 2.56 mg/dL — ABNORMAL HIGH (ref 0.61–1.24)
Creatinine, Ser: 2.05 mg/dL — ABNORMAL HIGH (ref 0.61–1.24)
Creatinine, Ser: 1.49 mg/dL — ABNORMAL HIGH (ref 0.61–1.24)
GFR, Estimated: 33 mL/min — ABNORMAL LOW
GFR, Estimated: 43 mL/min — ABNORMAL LOW (ref >60)
GFR, Estimated: >60 mL/min (ref >60)
Glucose, Bld: 920 mg/dL (ref 70–99)
Glucose, Bld: 390 mg/dL — ABNORMAL HIGH (ref 70–99)
Glucose, Bld: 222 mg/dL — ABNORMAL HIGH (ref 70–99)
Potassium: 6.4 mmol/L (ref 3.5–5.1)
Potassium: 4.8 mmol/L (ref 3.5–5.1)
Potassium: 4.7 mmol/L (ref 3.5–5.1)
Sodium: 139 mmol/L (ref 135–145)
Sodium: 152 mmol/L — ABNORMAL HIGH (ref 135–145)
Sodium: 155 mmol/L — ABNORMAL HIGH (ref 135–145)

## 2024-02-18 LAB — I-STAT VENOUS BLOOD GAS, ED
Acid-base deficit: 20 mmol/L — ABNORMAL HIGH (ref 0.0–2.0)
Bicarbonate: 6.9 mmol/L — ABNORMAL LOW (ref 20.0–28.0)
Calcium, Ion: 1.16 mmol/L (ref 1.15–1.40)
HCT: 51 % (ref 39.0–52.0)
Hemoglobin: 17.3 g/dL — ABNORMAL HIGH (ref 13.0–17.0)
O2 Saturation: 99 %
Potassium: 7.5 mmol/L (ref 3.5–5.1)
Sodium: 129 mmol/L — ABNORMAL LOW (ref 135–145)
TCO2: 8 mmol/L — ABNORMAL LOW (ref 22–32)
pCO2, Ven: 20.8 mmHg — ABNORMAL LOW (ref 44–60)
pH, Ven: 7.132 — CL (ref 7.25–7.43)
pO2, Ven: 201 mmHg — ABNORMAL HIGH (ref 32–45)

## 2024-02-18 LAB — CBC WITH DIFFERENTIAL/PLATELET
Abs Immature Granulocytes: 0.22 K/uL — ABNORMAL HIGH (ref 0.00–0.07)
Basophils Absolute: 0.1 K/uL (ref 0.0–0.1)
Basophils Relative: 1 %
Eosinophils Absolute: 0 K/uL (ref 0.0–0.5)
Eosinophils Relative: 0 %
HCT: 52 % (ref 39.0–52.0)
Hemoglobin: 16.1 g/dL (ref 13.0–17.0)
Immature Granulocytes: 1 %
Lymphocytes Relative: 10 %
Lymphs Abs: 1.7 K/uL (ref 0.7–4.0)
MCH: 28.9 pg (ref 26.0–34.0)
MCHC: 31 g/dL (ref 30.0–36.0)
MCV: 93.4 fL (ref 80.0–100.0)
Monocytes Absolute: 1.4 K/uL — ABNORMAL HIGH (ref 0.1–1.0)
Monocytes Relative: 8 %
Neutro Abs: 14 K/uL — ABNORMAL HIGH (ref 1.7–7.7)
Neutrophils Relative %: 80 %
Platelets: 576 K/uL — ABNORMAL HIGH (ref 150–400)
RBC: 5.57 MIL/uL (ref 4.22–5.81)
RDW: 12.6 % (ref 11.5–15.5)
WBC: 17.5 K/uL — ABNORMAL HIGH (ref 4.0–10.5)
nRBC: 0 % (ref 0.0–0.2)

## 2024-02-18 LAB — RAPID URINE DRUG SCREEN, HOSP PERFORMED
Amphetamines: NOT DETECTED
Barbiturates: NOT DETECTED
Benzodiazepines: NOT DETECTED
Cocaine: NOT DETECTED
Opiates: NOT DETECTED
Tetrahydrocannabinol: POSITIVE — AB

## 2024-02-18 LAB — COMPREHENSIVE METABOLIC PANEL WITH GFR
ALT: 41 U/L (ref 0–44)
AST: 44 U/L — ABNORMAL HIGH (ref 15–41)
Albumin: 4.8 g/dL (ref 3.5–5.0)
Alkaline Phosphatase: 125 U/L (ref 38–126)
BUN: 41 mg/dL — ABNORMAL HIGH (ref 6–20)
CO2: <7 mmol/L — ABNORMAL LOW (ref 22–32)
Calcium: 10.6 mg/dL — ABNORMAL HIGH (ref 8.9–10.3)
Chloride: 88 mmol/L — ABNORMAL LOW (ref 98–111)
Creatinine, Ser: 2.72 mg/dL — ABNORMAL HIGH (ref 0.61–1.24)
GFR, Estimated: 31 mL/min — ABNORMAL LOW (ref >60)
Glucose, Bld: >1200 mg/dL (ref 70–99)
Potassium: >7.5 mmol/L (ref 3.5–5.1)
Sodium: 136 mmol/L (ref 135–145)
Total Bilirubin: 2.8 mg/dL — ABNORMAL HIGH (ref 0.0–1.2)
Total Protein: 9.1 g/dL — ABNORMAL HIGH (ref 6.5–8.1)

## 2024-02-18 LAB — URINALYSIS, ROUTINE W REFLEX MICROSCOPIC
Bilirubin Urine: NEGATIVE
Glucose, UA: >=500 mg/dL — AB
Hgb urine dipstick: NEGATIVE
Ketones, ur: 20 mg/dL — AB
Leukocytes,Ua: NEGATIVE
Nitrite: NEGATIVE
Protein, ur: 100 mg/dL — AB
Specific Gravity, Urine: 1.022 (ref 1.005–1.030)
pH: 5 (ref 5.0–8.0)

## 2024-02-18 LAB — BETA-HYDROXYBUTYRIC ACID: Beta-Hydroxybutyric Acid: >8 mmol/L — ABNORMAL HIGH (ref 0.05–0.27)

## 2024-02-18 LAB — CBG MONITORING, ED
Glucose-Capillary: >600 mg/dL (ref 70–99)
Glucose-Capillary: >600 mg/dL (ref 70–99)
Glucose-Capillary: >600 mg/dL (ref 70–99)
Glucose-Capillary: >600 mg/dL (ref 70–99)
Glucose-Capillary: >600 mg/dL (ref 70–99)
Glucose-Capillary: >600 mg/dL (ref 70–99)
Glucose-Capillary: 456 mg/dL — ABNORMAL HIGH (ref 70–99)
Glucose-Capillary: 538 mg/dL (ref 70–99)
Glucose-Capillary: 376 mg/dL — ABNORMAL HIGH (ref 70–99)

## 2024-02-18 LAB — LIPASE, BLOOD: Lipase: 131 U/L — ABNORMAL HIGH (ref 11–51)

## 2024-02-18 LAB — ETHANOL: Alcohol, Ethyl (B): <10 mg/dL

## 2024-02-18 LAB — PHOSPHORUS: Phosphorus: 2.6 mg/dL (ref 2.5–4.6)

## 2024-02-18 LAB — MRSA NEXT GEN BY PCR, NASAL: MRSA by PCR Next Gen: NOT DETECTED

## 2024-02-18 LAB — MAGNESIUM: Magnesium: 3.8 mg/dL — ABNORMAL HIGH (ref 1.7–2.4)

## 2024-02-18 MED ORDER — CALCIUM GLUCONATE 10 % IV SOLN
1.0000 g | Freq: Once | INTRAVENOUS | Status: AC
  Administered 2024-02-18: 1 g via INTRAVENOUS
  Filled 2024-02-18: qty 10

## 2024-02-18 MED ORDER — INSULIN REGULAR(HUMAN) IN NACL 100-0.9 UT/100ML-% IV SOLN
INTRAVENOUS | Status: DC
  Administered 2024-02-18: 6 [IU]/h via INTRAVENOUS
  Administered 2024-02-19: 3.8 [IU]/h via INTRAVENOUS
  Filled 2024-02-18 (×2): qty 100

## 2024-02-18 MED ORDER — LACTATED RINGERS IV SOLN: INTRAVENOUS | Status: AC

## 2024-02-18 MED ORDER — DOCUSATE SODIUM 100 MG PO CAPS: 100.0000 mg | ORAL_CAPSULE | Freq: Two times a day (BID) | ORAL | Status: DC | PRN

## 2024-02-18 MED ORDER — POLYETHYLENE GLYCOL 3350 17 G PO PACK: 17.0000 g | PACK | Freq: Every day | ORAL | Status: DC | PRN

## 2024-02-18 MED ORDER — DEXTROSE IN LACTATED RINGERS 5 % IV SOLN: INTRAVENOUS | Status: DC

## 2024-02-18 MED ORDER — CHLORHEXIDINE GLUCONATE CLOTH 2 % EX PADS
6.0000 | MEDICATED_PAD | Freq: Every day | CUTANEOUS | Status: DC
  Administered 2024-02-18 – 2024-02-19 (×2): 6 via TOPICAL

## 2024-02-18 MED ORDER — DEXTROSE 50 % IV SOLN: 0.0000 mL | INTRAVENOUS | Status: DC | PRN

## 2024-02-18 MED ORDER — SODIUM BICARBONATE 8.4 % IV SOLN
50.0000 meq | Freq: Once | INTRAVENOUS | Status: AC
  Administered 2024-02-18: 50 meq via INTRAVENOUS
  Filled 2024-02-18: qty 50

## 2024-02-18 MED ORDER — HEPARIN SODIUM (PORCINE) 5000 UNIT/ML IJ SOLN
5000.0000 [IU] | Freq: Three times a day (TID) | INTRAMUSCULAR | Status: DC
  Administered 2024-02-18 – 2024-02-20 (×6): 5000 [IU] via SUBCUTANEOUS
  Filled 2024-02-18 (×6): qty 1

## 2024-02-18 MED ORDER — LACTATED RINGERS IV BOLUS
2000.0000 mL | Freq: Once | INTRAVENOUS | Status: AC
  Administered 2024-02-18: 2000 mL via INTRAVENOUS

## 2024-02-18 MED ORDER — INSULIN ASPART 100 UNIT/ML IV SOLN
10.0000 [IU] | Freq: Once | INTRAVENOUS | Status: AC
  Administered 2024-02-18: 10 [IU] via INTRAVENOUS

## 2024-02-18 NOTE — H&P (Signed)
 NAME:  Jacob [name], MRN:  [id number], DOB:  1991/03/15, LOS: 0 ADMISSION DATE:  02/18/2024, CONSULTATION DATE:  02/18/24 REFERRING MD:  EDP, CHIEF COMPLAINT:  AMS   History of Present Illness:  33 year old man w/ hx of schizoaffective disorder, type 1 diabetes, recurrent DKA admits presenting with confusion and N/V found to have DKA.  Due to persistent encephalopathy PCCM to watch one night in ICU.  Patient starting to come around, admits to N/V/fatigue/SOB otherwise neg ROS.  Noted multiple psych and DM1 admits over past couple years.  Pertinent  Medical History  Schizoaffective disorder Poorly controlled DM1  Significant Hospital Events: Including procedures, antibiotic start and stop dates in addition to other pertinent events   02/18/24  Interim History / Subjective:  Admit  Objective   Blood pressure (!) 135/98, pulse (!) 123, temperature 97.9 F (36.6 C), temperature source Axillary, resp. rate 13, height 5\' 9"  (1.753 m), weight 59.4 kg, SpO2 99%.        Intake/Output Summary (Last 24 hours) at 02/18/2024 1724 Last data filed at 02/18/2024 1410 Gross per 24 hour  Intake 2000 ml  Output --  Net 2000 ml   Filed Weights   02/18/24 1412  Weight: 59.4 kg    Examination: General: Chronically ill appearing man laying in bed HENT: MM dry, +temporal wasting Lungs: tachypneic, clear breath sounds Cardiovascular: tachy, ext warm, good pulses Abdomen: soft, hypoactive BS Extremities: +muscle wasting Neuro: moves to command with enough stimulation Skin: no rashes  Labs/CXR/EKG reviewed  Resolved Hospital Problem list   N/A  Assessment & Plan:  DKA (acidemia, hyperkalemia)- suspect mostly noncompliance, do not see evidence of infection, ACS or other trigger. Hypernatremia Schizoaffective disorder with prior SI/SA- will need to watch to make sure not recurrent, denies at present Protein calorie malnutrition POA  - DKA protocol insulin  gtt and aggressive IVF - Monitor  airway - SI/SA assessment once more awake - Check lipase and LFTs - Will ask IMTS to pick up tomorrow as suspect rapid improvement  Best Practice (right click and "Reselect all SmartList Selections" daily)   Diet/type: NPO DVT prophylaxis prophylactic heparin   Pressure ulcer(s): N/A GI prophylaxis: N/A Lines: N/A Foley:  N/A Code Status:  full code Last date of multidisciplinary goals of care discussion [pending]  Labs   CBC: Recent Labs  Lab 02/18/24 1236 02/18/24 1252 02/18/24 1338  WBC 17.5*  --   --   NEUTROABS 14.0*  --   --   HGB 16.1 17.3* 18.0*  HCT 52.0 51.0 53.0*  MCV 93.4  --   --   PLT 576*  --   --     Basic Metabolic Panel: Recent Labs  Lab 02/18/24 1236 02/18/24 1252 02/18/24 1338 02/18/24 1500  NA 136 129* 129* 139  K >7.5* 7.5* 8.2* 6.4*  CL 88*  --  99 97*  CO2 <7*  --   --  11*  GLUCOSE >1,200*  --  >700* 920*  BUN 41*  --  42* 38*  CREATININE 2.72*  --  2.00* 2.56*  CALCIUM  10.6*  --   --  11.5*   GFR: Estimated Creatinine Clearance: 34.8 mL/min (A) (by C-G formula based on SCr of 2.56 mg/dL (H)). Recent Labs  Lab 02/18/24 1236  WBC 17.5*    Liver Function Tests: Recent Labs  Lab 02/18/24 1236  AST 44*  ALT 41  ALKPHOS 125  BILITOT 2.8*  PROT 9.1*  ALBUMIN 4.8   No results for input(s): "  LIPASE", "AMYLASE" in the last 168 hours. No results for input(s): "AMMONIA" in the last 168 hours.  ABG    Component Value Date/Time   PHART 7.232 (L) 03/04/2020 0140   PCO2ART 23.6 (L) 03/04/2020 0140   PO2ART 134 (H) 03/04/2020 0140   HCO3 6.9 (L) 02/18/2024 1252   TCO2 11 (L) 02/18/2024 1338   ACIDBASEDEF 20.0 (H) 02/18/2024 1252   O2SAT 99 02/18/2024 1252     Coagulation Profile: No results for input(s): "INR", "PROTIME" in the last 168 hours.  Cardiac Enzymes: No results for input(s): "CKTOTAL", "CKMB", "CKMBINDEX", "TROPONINI" in the last 168 hours.  HbA1C: HbA1c POC (<> result, manual entry)  Date/Time Value Ref Range  Status  11/17/2022 11:45 AM >14.0 (A) 4.0 - 5.6 % Final   Hgb A1c MFr Bld  Date/Time Value Ref Range Status  02/07/2024 06:32 PM 14.5 (H) 4.8 - 5.6 % Final    Comment:    (NOTE)         Prediabetes: 5.7 - 6.4         Diabetes: >6.4         Glycemic control for adults with diabetes: <7.0   01/28/2024 11:50 AM 14.8 (H) 4.8 - 5.6 % Final    Comment:    (NOTE) Pre diabetes:          5.7%-6.4%  Diabetes:              >6.4%  Glycemic control for   <7.0% adults with diabetes     CBG: Recent Labs  Lab 02/18/24 1453 02/18/24 1534 02/18/24 1604 02/18/24 1639 02/18/24 1705  GLUCAP >600* >600* >600* 538* 456*    Review of Systems:   Limited but per HPI  Past Medical History:  He,  has a past medical history of Bipolar 1 disorder (HCC), History of attempted suicide (2019), Schizoaffective disorder (HCC), Type 1 diabetes mellitus on insulin  therapy (HCC) (2019), and Vitamin D  deficiency (01/03/2024).   Surgical History:  History reviewed. No pertinent surgical history.   Social History:   reports that he has quit smoking. His smoking use included cigars and cigarettes. He has never used smokeless tobacco. He reports current alcohol use. He reports that he does not currently use drugs after having used the following drugs: Marijuana.   Family History:  His family history includes Healthy in his father and mother.   Allergies No Known Allergies   Home Medications  Prior to Admission medications   Medication Sig Start Date End Date Taking? Authorizing Provider  benztropine  (COGENTIN ) 0.5 MG tablet Take 1 tablet (0.5 mg total) by mouth 2 (two) times daily. For prevention of EPS 02/09/24   Asuncion Layer I, NP  blood glucose meter kit and supplies KIT Dispense based on patient and insurance preference. Use up to four times daily as directed. 02/14/24   Aurora Lees, DO  Blood Glucose Monitoring Suppl (BLOOD GLUCOSE MONITOR SYSTEM) w/Device KIT 1 each by Does not apply route once  for 1 dose. 02/14/24 02/14/24  Aurora Lees, DO  Continuous Glucose Sensor (DEXCOM G7 SENSOR) MISC Change sensor every 10 days 02/14/24   Aurora Lees, DO  glucose blood (ACCU-CHEK GUIDE TEST) test strip Use up to 3 strips a day when not wearing Continuous glucose monitor 11/16/23   Carleen Chary, DO  haloperidol  (HALDOL ) 5 MG tablet Take 1 tablet (5 mg total) by mouth 2 (two) times daily. For mood control 02/09/24   Asuncion Layer I, NP  haloperidol  decanoate (HALDOL  DECANOATE) 100  MG/ML injection Inject 1 mL (100 mg total) into the muscle every 30 (thirty) days. (Due on 03-09-24): For mood control 03/09/24   Asuncion Layer I, NP  hydrOXYzine  (ATARAX ) 25 MG tablet Take 1 tablet (25 mg total) by mouth 3 (three) times daily as needed for anxiety. 02/09/24   Asuncion Layer I, NP  insulin  aspart (NOVOLOG ) 100 UNIT/ML injection Inject 7 Units into the skin 3 (three) times daily before meals. For diabetes control 02/14/24   Aurora Lees, DO  insulin  glargine-yfgn (SEMGLEE ) 100 UNIT/ML injection Inject 0.3 mLs (30 Units total) into the skin daily. For diabetes control. 02/14/24   Aurora Lees, DO  Insulin  Pen Needle (BD PEN NEEDLE NANO 2ND GEN) 32G X 4 MM MISC Use to inject insulin  up to 4 times a day 11/16/23   Carleen Chary, DO  Lancet Device MISC 1 each by Does not apply route 3 (three) times daily. May dispense any manufacturer covered by patient's insurance. 10/24/23   Maxie Spaniel, MD  traZODone  (DESYREL ) 50 MG tablet Take 1 tablet (50 mg total) by mouth at bedtime. For sleep 02/09/24   Asuncion Layer I, NP  Vitamin D , Ergocalciferol , (DRISDOL ) 1.25 MG (50000 UNIT) CAPS capsule Take 1 capsule (50,000 Units total) by mouth every 7 (seven) days. 02/13/24   Asuncion Layer I, NP     Critical care time: 34 min

## 2024-02-18 NOTE — ED Provider Triage Note (Signed)
 Emergency Medicine Provider Triage Evaluation Note  Jacob [name] , a 33 y.o. male  was evaluated in triage.  Pt complains of AMS, hyperglycemia. Patient is a type 1 diabetic with concern for poor medication compliance as he is currently homeless. Unable to provide much detailed history for me. Unclear if there could be intoxicating substances present. He was observed to be vomiting while in triage.  Review of Systems  Positive: As above Negative: As above  Physical Exam  BP 121/76   Pulse (!) 132   Temp (!) 97.5 F (36.4 C) (Oral)   Resp 17   SpO2 92%  Gen:   Awake, no distress   Resp:  Normal effort  MSK:   Moves extremities without difficulty  Other:    Medical Decision Making  Medically screening exam initiated at 12:38 PM.  Appropriate orders placed.  Jacob [name] was informed that the remainder of the evaluation will be completed by another provider, this initial triage assessment does not replace that evaluation, and the importance of remaining in the ED until their evaluation is complete.  Concern for DKA. Will have him roomed next as glucose is reading >600.   [name], [name] 02/18/24 1241

## 2024-02-18 NOTE — ED Triage Notes (Signed)
 Unable to get much from pt, lethargic in triage, CBG reading HIGH.

## 2024-02-18 NOTE — ED Notes (Signed)
 PT pulled his IV out of his  left hand

## 2024-02-18 NOTE — ED Notes (Signed)
 PT is responsive but has projectile vomited twice.Physician was alerted and PT was cleaned and  sat upright.

## 2024-02-18 NOTE — ED Notes (Signed)
 Pt found in a wet bed all wires removed all covers all over the place  bed changed wires replaced blankets given  strong odor of ketones

## 2024-02-18 NOTE — ED Provider Notes (Signed)
 [place] EMERGENCY DEPARTMENT AT [facility] Provider Note   CSN: [id number] Arrival date & time: 02/18/24  1225     History  Chief Complaint  Patient presents with   Altered Mental Status   Hyperglycemia    Jacob [name] is a 33 y.o. male.  HPI    33 year old male comes in with chief complaint of altered mental status. Level 5 caveat for altered mental status.  Patient has history of uncontrolled type 1 diabetes, schizoaffective disorder, depression.  He came into the ER via private conveyance.  Per triage nurse, family states that they will return after parking the car.  They brought him to the ER because patient lethargic.  Patient repetitive.  He is oriented to self.  He denies any pain.  He is unable to tell me his diabetes medications or if he is taking them.  Home Medications Prior to Admission medications   Medication Sig Start Date End Date Taking? Authorizing Provider  benztropine  (COGENTIN ) 0.5 MG tablet Take 1 tablet (0.5 mg total) by mouth 2 (two) times daily. For prevention of EPS 02/09/24   Asuncion Layer I, NP  blood glucose meter kit and supplies KIT Dispense based on patient and insurance preference. Use up to four times daily as directed. 02/14/24   Aurora Lees, DO  Blood Glucose Monitoring Suppl (BLOOD GLUCOSE MONITOR SYSTEM) w/Device KIT 1 each by Does not apply route once for 1 dose. 02/14/24 02/14/24  Aurora Lees, DO  Continuous Glucose Sensor (DEXCOM G7 SENSOR) MISC Change sensor every 10 days 02/14/24   Aurora Lees, DO  glucose blood (ACCU-CHEK GUIDE TEST) test strip Use up to 3 strips a day when not wearing Continuous glucose monitor 11/16/23   Carleen Chary, DO  haloperidol  (HALDOL ) 5 MG tablet Take 1 tablet (5 mg total) by mouth 2 (two) times daily. For mood control 02/09/24   Asuncion Layer I, NP  haloperidol  decanoate (HALDOL  DECANOATE) 100 MG/ML injection Inject 1 mL (100 mg total) into the muscle every 30 (thirty) days. (Due on  03-09-24): For mood control 03/09/24   Asuncion Layer I, NP  hydrOXYzine  (ATARAX ) 25 MG tablet Take 1 tablet (25 mg total) by mouth 3 (three) times daily as needed for anxiety. 02/09/24   Asuncion Layer I, NP  insulin  aspart (NOVOLOG ) 100 UNIT/ML injection Inject 7 Units into the skin 3 (three) times daily before meals. For diabetes control 02/14/24   Aurora Lees, DO  insulin  glargine-yfgn (SEMGLEE ) 100 UNIT/ML injection Inject 0.3 mLs (30 Units total) into the skin daily. For diabetes control. 02/14/24   Aurora Lees, DO  Insulin  Pen Needle (BD PEN NEEDLE NANO 2ND GEN) 32G X 4 MM MISC Use to inject insulin  up to 4 times a day 11/16/23   Carleen Chary, DO  Lancet Device MISC 1 each by Does not apply route 3 (three) times daily. May dispense any manufacturer covered by patient's insurance. 10/24/23   Maxie Spaniel, MD  traZODone  (DESYREL ) 50 MG tablet Take 1 tablet (50 mg total) by mouth at bedtime. For sleep 02/09/24   Asuncion Layer I, NP  Vitamin D , Ergocalciferol , (DRISDOL ) 1.25 MG (50000 UNIT) CAPS capsule Take 1 capsule (50,000 Units total) by mouth every 7 (seven) days. 02/13/24   Donnis Galeazzi, NP      Allergies    Patient has no known allergies.    Review of Systems   Review of Systems  All other systems reviewed and are negative.   Physical Exam Updated Vital Signs  BP (!) 141/96   Pulse (!) 125   Temp 97.9 F (36.6 C) (Axillary)   Resp 16   Ht 5\' 9"  (1.753 m)   Wt 59.4 kg   SpO2 99%   BMI 19.34 kg/m  Physical Exam Vitals and nursing note reviewed.  Constitutional:      Appearance: He is well-developed. He is ill-appearing.     Comments: Lethargic  HENT:     Head: Atraumatic.  Eyes:     Comments: Pupils are 3 mm and equal  Cardiovascular:     Rate and Rhythm: Tachycardia present.  Pulmonary:     Comments: Tachypnea Musculoskeletal:     Cervical back: Neck supple.  Skin:    General: Skin is warm.  Neurological:     Mental Status: He is disoriented.     Comments:  Moving all 4 extremities, responding to noxious stimuli     ED Results / Procedures / Treatments   Labs (all labs ordered are listed, but only abnormal results are displayed) Labs Reviewed  CBC WITH DIFFERENTIAL/PLATELET - Abnormal; Notable for the following components:      Result Value   WBC 17.5 (*)    Platelets 576 (*)    Neutro Abs 14.0 (*)    Monocytes Absolute 1.4 (*)    Abs Immature Granulocytes 0.22 (*)    All other components within normal limits  COMPREHENSIVE METABOLIC PANEL WITH GFR - Abnormal; Notable for the following components:   Potassium >7.5 (*)    Chloride 88 (*)    CO2 <7 (*)    Glucose, Bld >1,200 (*)    BUN 41 (*)    Creatinine, Ser 2.72 (*)    Calcium  10.6 (*)    Total Protein 9.1 (*)    AST 44 (*)    Total Bilirubin 2.8 (*)    GFR, Estimated 31 (*)    All other components within normal limits  URINALYSIS, ROUTINE W REFLEX MICROSCOPIC - Abnormal; Notable for the following components:   Glucose, UA >=500 (*)    Ketones, ur 20 (*)    Protein, ur 100 (*)    Bacteria, UA RARE (*)    All other components within normal limits  BETA-HYDROXYBUTYRIC ACID - Abnormal; Notable for the following components:   Beta-Hydroxybutyric Acid >8.00 (*)    All other components within normal limits  RAPID URINE DRUG SCREEN, HOSP PERFORMED - Abnormal; Notable for the following components:   Tetrahydrocannabinol POSITIVE (*)    All other components within normal limits  BASIC METABOLIC PANEL WITH GFR - Abnormal; Notable for the following components:   Potassium 6.4 (*)    Chloride 97 (*)    CO2 11 (*)    Glucose, Bld 920 (*)    BUN 38 (*)    Creatinine, Ser 2.56 (*)    Calcium  11.5 (*)    GFR, Estimated 33 (*)    Anion gap 31 (*)    All other components within normal limits  OSMOLALITY - Abnormal; Notable for the following components:   Osmolality 398 (*)    All other components within normal limits  CBG MONITORING, ED - Abnormal; Notable for the following  components:   Glucose-Capillary >600 (*)    All other components within normal limits  CBG MONITORING, ED - Abnormal; Notable for the following components:   Glucose-Capillary >600 (*)    All other components within normal limits  I-STAT VENOUS BLOOD GAS, ED - Abnormal; Notable for the following components:  pH, Ven 7.132 (*)    pCO2, Ven 20.8 (*)    pO2, Ven 201 (*)    Bicarbonate 6.9 (*)    TCO2 8 (*)    Acid-base deficit 20.0 (*)    Sodium 129 (*)    Potassium 7.5 (*)    Hemoglobin 17.3 (*)    All other components within normal limits  CBG MONITORING, ED - Abnormal; Notable for the following components:   Glucose-Capillary >600 (*)    All other components within normal limits  I-STAT CHEM 8, ED - Abnormal; Notable for the following components:   Sodium 129 (*)    Potassium 8.2 (*)    BUN 42 (*)    Creatinine, Ser 2.00 (*)    Glucose, Bld >700 (*)    TCO2 11 (*)    Hemoglobin 18.0 (*)    HCT 53.0 (*)    All other components within normal limits  CBG MONITORING, ED - Abnormal; Notable for the following components:   Glucose-Capillary >600 (*)    All other components within normal limits  CBG MONITORING, ED - Abnormal; Notable for the following components:   Glucose-Capillary >600 (*)    All other components within normal limits  CBG MONITORING, ED - Abnormal; Notable for the following components:   Glucose-Capillary >600 (*)    All other components within normal limits  CBG MONITORING, ED - Abnormal; Notable for the following components:   Glucose-Capillary 538 (*)    All other components within normal limits  CBG MONITORING, ED - Abnormal; Notable for the following components:   Glucose-Capillary 456 (*)    All other components within normal limits  CULTURE, BLOOD (ROUTINE X 2)  CULTURE, BLOOD (ROUTINE X 2)  ETHANOL  BASIC METABOLIC PANEL WITH GFR  BASIC METABOLIC PANEL WITH GFR  BASIC METABOLIC PANEL WITH GFR  MAGNESIUM   PHOSPHORUS  LIPASE, BLOOD  CBC     EKG EKG Interpretation Date/Time:  Saturday February 18 2024 14:57:14 EDT Ventricular Rate:  129 PR Interval:  136 QRS Duration:  117 QT Interval:  318 QTC Calculation: 466 R Axis:   96  Text Interpretation: Sinus tachycardia LAE, consider biatrial enlargement Nonspecific intraventricular conduction delay ST elev, probable normal early repol pattern hyperacute T waves Confirmed by Deatra Face [phone]) on 02/18/2024 4:41:53 PM ED ECG REPORT   Date: 02/18/2024  Rate: 119  Rhythm: sinus tachycardia  QRS Axis: normal  Intervals: normal  ST/T Wave abnormalities:  Nonspecific ST and T wave changes  Conduction Disutrbances:none  Narrative Interpretation:   Old EKG Reviewed: changes noted patient has hyperacute T waves  I have personally reviewed the EKG tracing and agree with the computerized printout as noted.   EKG Interpretation Date/Time:  Saturday February 18 2024 14:57:14 EDT Ventricular Rate:  129 PR Interval:  136 QRS Duration:  117 QT Interval:  318 QTC Calculation: 466 R Axis:   96  Text Interpretation: Sinus tachycardia LAE, consider biatrial enlargement Nonspecific intraventricular conduction delay ST elev, probable normal early repol pattern hyperacute T waves Confirmed by Deatra Face [phone]) on 02/18/2024 4:41:53 PM         Radiology No results found.  Procedures .Critical Care  Performed by: Deatra Face, MD Authorized by: Deatra Face, MD   Critical care provider statement:    Critical care time (minutes):  92   Critical care time was exclusive of:  Separately billable procedures and treating other patients   Critical care was necessary to treat or prevent imminent or  life-threatening deterioration of the following conditions:  Circulatory failure, metabolic crisis, renal failure, dehydration and endocrine crisis   Critical care was time spent personally by me on the following activities:  Development of treatment plan with patient or  surrogate, discussions with consultants, evaluation of patient's response to treatment, examination of patient, ordering and review of laboratory studies, ordering and review of radiographic studies, ordering and performing treatments and interventions, pulse oximetry, re-evaluation of patient's condition, review of old charts and obtaining history from patient or surrogate     Medications Ordered in ED Medications  insulin  regular, human (MYXREDLIN ) 100 units/ 100 mL infusion (3.6 Units/hr Intravenous Rate/Dose Change 02/18/24 1710)  lactated ringers  infusion ( Intravenous New Bag/Given 02/18/24 1556)  dextrose  5 % in lactated ringers  infusion (has no administration in time range)  dextrose  50 % solution 0-50 mL (has no administration in time range)  docusate sodium  (COLACE) capsule 100 mg (has no administration in time range)  polyethylene glycol (MIRALAX  / GLYCOLAX ) packet 17 g (has no administration in time range)  heparin  injection 5,000 Units (5,000 Units Subcutaneous Given 02/18/24 1713)  lactated ringers  bolus 2,000 mL (0 mLs Intravenous Stopped 02/18/24 1410)  calcium  gluconate inj 10% (1 g) URGENT USE ONLY! (1 g Intravenous Given 02/18/24 1319)  insulin  aspart (novoLOG ) injection 10 Units (10 Units Intravenous Given 02/18/24 1345)  sodium bicarbonate  injection 50 mEq (50 mEq Intravenous Given 02/18/24 1325)  sodium bicarbonate  injection 50 mEq (50 mEq Intravenous Given 02/18/24 1651)  calcium  gluconate inj 10% (1 g) URGENT USE ONLY! (1 g Intravenous Given 02/18/24 1656)    ED Course/ Medical Decision Making/ A&P                                 Medical Decision Making Amount and/or Complexity of Data Reviewed Labs: ordered. Radiology: ordered.  Risk OTC drugs. Prescription drug management. Decision regarding hospitalization.   This patient presents to the ED with chief complaint(s) of altered mental status with pertinent past medical history of diabetes, schizoaffective  disorder.patient is altered, disoriented , has Kussmaul's respiration and tachycardic.  The complaint involves an extensive differential diagnosis and also carries with it a high risk of complications and morbidity.    The differential diagnosis includes : DKA, HHS, mixed HHS/DKA, hyperglycemia without ketosis, acute brain bleed, acute renal failure, severe electrolyte abnormality including hyperkalemia, dehydration  The initial plan is to get basic labs. On monitoring, it appears that he has hyperacute T waves.  Stat EKG ordered.  High suspicion for hyper-K.  We will likely give him hyper-K medications prior to lab confirmation to prevent cardiovascular decompensation.  2 L of IV fluid ordered.   Additional history obtained: Records reviewed previous admission documents and previous ED visits, previous labs and echocardiogram.  Independent labs interpretation:  The following labs were independently interpreted:  Patient's blood sugars over 1200.  He has elevated K over 7.5 and new renal failure.  Treatment and Reassessment: Initial workup confirmed acute renal failure, severe hyperkalemia, hyperglycemia. Bicarb is undetectable.  Anion gap over 30.  2 L of LR was ordered upfront.  Patient has pH of 7.13 and potassium of 8.2.  Initial hyperkalemia medication included insulin  bolus of 10 units along with calcium  gluconate, sodium bicarb.  Labs were repeated at 3 PM.  There is improvement in K.  Blood sugar also improved. Repeat EKG reveals persistent peaked T waves and PR prolongation.  Another round of  hyper-K meds ordered.  Consultation: - Consulted or discussed management/test interpretation with external professional: ICU team.  They will admit the patient.  They are comfortable with activation of DKA order set for fluid resuscitation and insulin  for now.  Have asked him specifically if we need to order half-normal saline.  Clinically it appears that patient has mixed HHS and DKA.  Beta  hydroxybutyrate acid over 8.  However patient also has serum awesome of 390.     Final Clinical Impression(s) / ED Diagnoses Final diagnoses:  Type 1 diabetes mellitus with ketoacidotic coma (HCC)  Acute hyperkalemia  HHS (hypothenar hammer syndrome) (HCC)    Rx / DC Orders ED Discharge Orders     None         Deatra Face, MD 02/18/24 1731

## 2024-02-18 NOTE — ED Notes (Signed)
Report given to rn on 3m 

## 2024-02-18 NOTE — ED Notes (Signed)
 Called and placed PT on monitor with CCMD.

## 2024-02-18 NOTE — Progress Notes (Signed)
 Internal Medicine Teaching Service to assume care on 02/19/24.   Vannessa Godown, MD [facility] Internal Medicine Resident  PGY-2

## 2024-02-19 DIAGNOSIS — F209 Schizophrenia, unspecified: Secondary | ICD-10-CM

## 2024-02-19 DIAGNOSIS — E1110 Type 2 diabetes mellitus with ketoacidosis without coma: Secondary | ICD-10-CM

## 2024-02-19 DIAGNOSIS — E870 Hyperosmolality and hypernatremia: Secondary | ICD-10-CM

## 2024-02-19 LAB — GLUCOSE, CAPILLARY
Glucose-Capillary: 118 mg/dL — ABNORMAL HIGH (ref 70–99)
Glucose-Capillary: 167 mg/dL — ABNORMAL HIGH (ref 70–99)
Glucose-Capillary: 171 mg/dL — ABNORMAL HIGH (ref 70–99)
Glucose-Capillary: 177 mg/dL — ABNORMAL HIGH (ref 70–99)
Glucose-Capillary: 182 mg/dL — ABNORMAL HIGH (ref 70–99)
Glucose-Capillary: 184 mg/dL — ABNORMAL HIGH (ref 70–99)
Glucose-Capillary: 202 mg/dL — ABNORMAL HIGH (ref 70–99)
Glucose-Capillary: 204 mg/dL — ABNORMAL HIGH (ref 70–99)
Glucose-Capillary: 217 mg/dL — ABNORMAL HIGH (ref 70–99)
Glucose-Capillary: 219 mg/dL — ABNORMAL HIGH (ref 70–99)
Glucose-Capillary: 227 mg/dL — ABNORMAL HIGH (ref 70–99)
Glucose-Capillary: 238 mg/dL — ABNORMAL HIGH (ref 70–99)
Glucose-Capillary: 258 mg/dL — ABNORMAL HIGH (ref 70–99)
Glucose-Capillary: 298 mg/dL — ABNORMAL HIGH (ref 70–99)
Glucose-Capillary: 313 mg/dL — ABNORMAL HIGH (ref 70–99)
Glucose-Capillary: 380 mg/dL — ABNORMAL HIGH (ref 70–99)
Glucose-Capillary: 509 mg/dL (ref 70–99)

## 2024-02-19 LAB — CBC
HCT: 46 % (ref 39.0–52.0)
Hemoglobin: 16.3 g/dL (ref 13.0–17.0)
MCH: 29.4 pg (ref 26.0–34.0)
MCHC: 35.4 g/dL (ref 30.0–36.0)
MCV: 82.9 fL (ref 80.0–100.0)
Platelets: 496 10*3/uL — ABNORMAL HIGH (ref 150–400)
RBC: 5.55 MIL/uL (ref 4.22–5.81)
RDW: 12.3 % (ref 11.5–15.5)
WBC: 17.8 10*3/uL — ABNORMAL HIGH (ref 4.0–10.5)
nRBC: 0 % (ref 0.0–0.2)

## 2024-02-19 LAB — BASIC METABOLIC PANEL WITH GFR
Anion gap: 12 (ref 5–15)
Anion gap: 12 (ref 5–15)
Anion gap: 14 (ref 5–15)
Anion gap: 10 (ref 5–15)
BUN: 27 mg/dL — ABNORMAL HIGH (ref 6–20)
BUN: 25 mg/dL — ABNORMAL HIGH (ref 6–20)
BUN: 22 mg/dL — ABNORMAL HIGH (ref 6–20)
BUN: 23 mg/dL — ABNORMAL HIGH (ref 6–20)
CO2: 29 mmol/L (ref 22–32)
CO2: 31 mmol/L (ref 22–32)
CO2: 26 mmol/L (ref 22–32)
CO2: 27 mmol/L (ref 22–32)
Calcium: 11.2 mg/dL — ABNORMAL HIGH (ref 8.9–10.3)
Calcium: 11.3 mg/dL — ABNORMAL HIGH (ref 8.9–10.3)
Calcium: 10.7 mg/dL — ABNORMAL HIGH (ref 8.9–10.3)
Calcium: 10.2 mg/dL (ref 8.9–10.3)
Chloride: 113 mmol/L — ABNORMAL HIGH (ref 98–111)
Chloride: 114 mmol/L — ABNORMAL HIGH (ref 98–111)
Chloride: 106 mmol/L (ref 98–111)
Chloride: 107 mmol/L (ref 98–111)
Creatinine, Ser: 1.28 mg/dL — ABNORMAL HIGH (ref 0.61–1.24)
Creatinine, Ser: 1.14 mg/dL (ref 0.61–1.24)
Creatinine, Ser: 1.58 mg/dL — ABNORMAL HIGH (ref 0.61–1.24)
Creatinine, Ser: 1.42 mg/dL — ABNORMAL HIGH (ref 0.61–1.24)
GFR, Estimated: >60 mL/min
GFR, Estimated: >60 mL/min (ref >60)
GFR, Estimated: 59 mL/min — ABNORMAL LOW (ref >60)
GFR, Estimated: >60 mL/min (ref >60)
Glucose, Bld: 284 mg/dL — ABNORMAL HIGH (ref 70–99)
Glucose, Bld: 165 mg/dL — ABNORMAL HIGH (ref 70–99)
Glucose, Bld: 383 mg/dL — ABNORMAL HIGH (ref 70–99)
Glucose, Bld: 238 mg/dL — ABNORMAL HIGH (ref 70–99)
Potassium: 4.3 mmol/L (ref 3.5–5.1)
Potassium: 3.8 mmol/L (ref 3.5–5.1)
Potassium: 3.4 mmol/L — ABNORMAL LOW (ref 3.5–5.1)
Potassium: 4 mmol/L (ref 3.5–5.1)
Sodium: 154 mmol/L — ABNORMAL HIGH (ref 135–145)
Sodium: 157 mmol/L — ABNORMAL HIGH (ref 135–145)
Sodium: 146 mmol/L — ABNORMAL HIGH (ref 135–145)
Sodium: 144 mmol/L (ref 135–145)

## 2024-02-19 MED ORDER — OXYCODONE HCL 5 MG PO TABS
5.0000 mg | ORAL_TABLET | Freq: Once | ORAL | Status: AC
  Administered 2024-02-19: 5 mg via ORAL
  Filled 2024-02-19: qty 1

## 2024-02-19 MED ORDER — INSULIN ASPART 100 UNIT/ML IJ SOLN
0.0000 [IU] | Freq: Three times a day (TID) | INTRAMUSCULAR | Status: DC
  Administered 2024-02-19: 7 [IU] via SUBCUTANEOUS

## 2024-02-19 MED ORDER — INSULIN GLARGINE-YFGN 100 UNIT/ML ~~LOC~~ SOLN
20.0000 [IU] | SUBCUTANEOUS | Status: DC
  Administered 2024-02-19: 20 [IU] via SUBCUTANEOUS
  Filled 2024-02-19: qty 0.2

## 2024-02-19 MED ORDER — DEXTROSE 5 % IV SOLN: INTRAVENOUS | Status: DC

## 2024-02-19 MED ORDER — INSULIN ASPART 100 UNIT/ML IJ SOLN
12.0000 [IU] | Freq: Once | INTRAMUSCULAR | Status: AC
  Administered 2024-02-19: 12 [IU] via SUBCUTANEOUS

## 2024-02-19 MED ORDER — SODIUM CHLORIDE 0.9 % IV SOLN: INTRAVENOUS | Status: AC

## 2024-02-19 MED ORDER — INSULIN ASPART 100 UNIT/ML IJ SOLN
0.0000 [IU] | Freq: Every day | INTRAMUSCULAR | Status: DC
  Administered 2024-02-19: 2 [IU] via SUBCUTANEOUS

## 2024-02-19 MED ORDER — INSULIN GLARGINE-YFGN 100 UNIT/ML ~~LOC~~ SOLN
20.0000 [IU] | Freq: Every day | SUBCUTANEOUS | Status: DC
  Filled 2024-02-19: qty 0.2

## 2024-02-19 MED ORDER — DEXTROSE IN LACTATED RINGERS 5 % IV SOLN: INTRAVENOUS | Status: DC

## 2024-02-19 MED ORDER — INSULIN ASPART 100 UNIT/ML IJ SOLN
0.0000 [IU] | Freq: Three times a day (TID) | INTRAMUSCULAR | Status: DC
  Administered 2024-02-20: 8 [IU] via SUBCUTANEOUS
  Administered 2024-02-20: 15 [IU] via SUBCUTANEOUS

## 2024-02-19 NOTE — Progress Notes (Signed)
 HD#1 SUBJECTIVE:  Patient Summary: Jacob [name] is a 33 y.o. past medical history of schizoaffective disorder, type 1 diabetes and recurrent DKA, presenting with confusion and nausea, vomiting and encephalopathic now stepping down from an overnight watch at the ICU.   Overnight Events: No acute events overnight  Interim History: Denies any nausea, denies any pain.  He does not remember when was the last time that he got an injection of the Haldol , when asked he says that it was last time he ate.  He denies any active hallucinations at this time.  OBJECTIVE:  Vital Signs: Vitals:   02/19/24 0600 02/19/24 0643 02/19/24 0700 02/19/24 0717  BP: 121/86  (!) 136/102   Pulse: 95 (!) 105 (!) 102   Resp: 10 14 13    Temp:    (!) 97 F (36.1 C)  TempSrc:    Axillary  SpO2: 98% 97% 98%   Weight:  34.9 kg    Height:       Supplemental O2: Room Air SpO2: 98 %  Filed Weights   02/18/24 1412 02/18/24 1754 02/19/24 0643  Weight: 59.4 kg 36.9 kg 34.9 kg     Intake/Output Summary (Last 24 hours) at 02/19/2024 9323 Last data filed at 02/19/2024 0700 Gross per 24 hour  Intake 3910.42 ml  Output 400 ml  Net 3510.42 ml   Net IO Since Admission: 3,510.42 mL [02/19/24 0826]  Physical Exam: Physical Exam  Patient Lines/Drains/Airways Status     Active Line/Drains/Airways     Name Placement date Placement time Site Days   Peripheral IV 02/18/24 20 G Anterior;Distal;Right;Upper Arm 02/18/24  1314  Arm  1   Peripheral IV 02/18/24 20 G Left Antecubital 02/18/24  1558  Antecubital  1   Peripheral IV 02/18/24 20 G Anterior;Right Forearm 02/18/24  1847  Forearm  1             ASSESSMENT/PLAN:  Assessment: Active Problems:   DKA (diabetic ketoacidosis) (HCC)   Plan:  DKA Hypernatremia  Currently on insulin  drip and aggressive IV fluid resuscitation.  He still is mildly encephalopathic, he knows his name and he knows where he is at but he is unable to tell me the year.  He thinks  is 2020.  He is also very lethargic.  Morning anion gap was 12.  We will wait until the anion gap closes for the second time.  His sodium was 154.  Based on his weight he would need to have D5 at around 75 mL/h, which we will start once his anion gap closes for the second time.  - Continue on insulin  drip and aggressive IV fluid resuscitation for now. -Continue being n.p.o. until he is able to tolerate p.o. intake. -Continue with BMPs every 4 hours   Schizoaffective disorder Follow-up with Dr. Sharlon Deacon on 15 April.  At that time he was following up on his schizoaffective disorder, bipolar type.  He had presented to [facility] behavioral health urgent care on January 28, 2024 seeking housing and he had reported paranoia and hallucinations.  At that time his blood sugars were around 700 and he was sent to the ED.  On April 3 he was seen inpatient by psychiatry and started IM Haldol  and had discontinued Abilify .  He did not have any suicidal or homicidal ideations at that time.  He currently denies any hallucinations, although he is unable to tell me when was the last time that he had an injection.  Per chart review it seems  that he would be due on 03/09/2024.  On the 02/15/2024 his mom called the [facility] clinic asking for a referral with behavioral health for his haloperidol  injections.  A referral to behavioral health was sent at that time.   Will need to follow-up with [facility] behavioral health, he does not need approval for this referral.  Best Practice: Diet: NPO IVF: Fluids: 0.9NS, Rate:  D5LR 125 mL an hour VTE: heparin  injection 5,000 Units Start: 02/18/24 1645 SCDs Start: 02/18/24 1637 Code: Full DISPO: Anticipated discharge tomorrow to  home  pending  medical workup .  Signature: [facility]  Internal Medicine Resident, PGY-1 [facility]  8:26 AM, 02/19/2024   Please contact the on call pager after 5 pm and on weekends at [phone].

## 2024-02-19 NOTE — Progress Notes (Addendum)
 eLink Physician-Brief Progress Note Patient Name: Jacob [name] DOB: 1991-04-04 MRN: [id number]   Date of Service  02/19/2024  HPI/Events of Note  33 year old man w/ hx of schizoaffective disorder, type 1 diabetes, recurrent DKA admits presenting with confusion and N/V found to have DKA.   Patient is having generalized discomfort.  Improved DKA labs. AKI improving.  eICU Interventions  Oxycodone  x 1.  Patient is on numerous antipsychotic meds but not any opiates at home.     4098 -safety sitter order for ongoing agitation  Intervention Category Minor Interventions: Routine modifications to care plan (e.g. PRN medications for pain, fever)  Eugenia Eldredge 02/19/2024, 12:41 AM

## 2024-02-19 NOTE — Inpatient Diabetes Management (Signed)
 Inpatient Diabetes Program Recommendations  AACE/ADA: New Consensus Statement on Inpatient Glycemic Control   Target Ranges:  Prepandial:   less than 140 mg/dL      Peak postprandial:   less than 180 mg/dL (1-2 hours)      Critically ill patients:  140 - 180 mg/dL    Latest Reference Range & Units 02/19/24 03:40 02/19/24 04:39 02/19/24 05:40 02/19/24 06:34 02/19/24 07:43  Glucose-Capillary 70 - 99 mg/dL 161 (H) 096 (H) 045 (H) 238 (H) 204 (H)    Latest Reference Range & Units 02/19/24 02:59  CO2 22 - 32 mmol/L 29  Glucose 70 - 99 mg/dL 409 (H)  Anion gap 5 - 15  12    Latest Reference Range & Units 02/18/24 12:36  CO2 22 - 32 mmol/L <7 (L)  Glucose 70 - 99 mg/dL >8,119 (HH)  Anion gap 5 - 15  NOT CALCULATED    Latest Reference Range & Units 02/18/24 12:36  Beta-Hydroxybutyric Acid 0.05 - 0.27 mmol/L >8.00 (H)   Review of Glycemic Control  Diabetes history: DM1 Outpatient Diabetes medications: Semglee  30 units daily, Novolog  7 units TID with meals Current orders for Inpatient glycemic control: IV insulin   Inpatient Diabetes Program Recommendations:    Insulin : Once provider is ready to transition from IV to SQ insulin , please consider ordering Semglee  20 units Q24H, CBGs Q4H, Novolog  0-9 units Q4H, and will need meal coverage insulin  once patient is eating and tolerating diet.  NOTE: Noted consult for Diabetes Coordinator. Diabetes Coordinator is not on campus over the weekend but available by pager from 8am to 5pm for questions or concerns. Chart reviewed. Patient is known to Inpatient Diabetes team due to multiple hospital admissions and ED visits. Patient was most recently inpatient at [facility] 02/02/24-02/09/24.  Thanks, Beacher Limerick, RN, MSN, CDCES Diabetes Coordinator Inpatient Diabetes Program [phone] (Team Pager from 8am to 5pm)

## 2024-02-19 NOTE — Plan of Care (Signed)

## 2024-02-19 NOTE — Plan of Care (Signed)
 Pt restless in bed, able to follow commands and answer simple questions. Sitter at bedside.

## 2024-02-20 ENCOUNTER — Other Ambulatory Visit (HOSPITAL_COMMUNITY): Payer: Self-pay

## 2024-02-20 DIAGNOSIS — E1011 Type 1 diabetes mellitus with ketoacidosis with coma: Principal | ICD-10-CM

## 2024-02-20 LAB — GLUCOSE, CAPILLARY
Glucose-Capillary: 293 mg/dL — ABNORMAL HIGH (ref 70–99)
Glucose-Capillary: 468 mg/dL — ABNORMAL HIGH (ref 70–99)
Glucose-Capillary: 383 mg/dL — ABNORMAL HIGH (ref 70–99)
Glucose-Capillary: 204 mg/dL — ABNORMAL HIGH (ref 70–99)

## 2024-02-20 LAB — BASIC METABOLIC PANEL WITH GFR
Anion gap: 11 (ref 5–15)
BUN: 28 mg/dL — ABNORMAL HIGH (ref 6–20)
CO2: 28 mmol/L (ref 22–32)
Calcium: 9.5 mg/dL (ref 8.9–10.3)
Chloride: 101 mmol/L (ref 98–111)
Creatinine, Ser: 1.23 mg/dL (ref 0.61–1.24)
GFR, Estimated: >60 mL/min (ref >60)
Glucose, Bld: 365 mg/dL — ABNORMAL HIGH (ref 70–99)
Potassium: 3.7 mmol/L (ref 3.5–5.1)
Sodium: 140 mmol/L (ref 135–145)

## 2024-02-20 LAB — CBC
HCT: 38.4 % — ABNORMAL LOW (ref 39.0–52.0)
Hemoglobin: 12.6 g/dL — ABNORMAL LOW (ref 13.0–17.0)
MCH: 28.5 pg (ref 26.0–34.0)
MCHC: 32.8 g/dL (ref 30.0–36.0)
MCV: 86.9 fL (ref 80.0–100.0)
Platelets: 342 10*3/uL (ref 150–400)
RBC: 4.42 MIL/uL (ref 4.22–5.81)
RDW: 12.7 % (ref 11.5–15.5)
WBC: 11.4 10*3/uL — ABNORMAL HIGH (ref 4.0–10.5)
nRBC: 0 % (ref 0.0–0.2)

## 2024-02-20 MED ORDER — LANTUS SOLOSTAR 100 UNIT/ML ~~LOC~~ SOPN
30.0000 [IU] | PEN_INJECTOR | Freq: Every day | SUBCUTANEOUS | 0 refills | Status: DC
  Filled 2024-02-20: qty 15, 50d supply, fill #0

## 2024-02-20 MED ORDER — INSULIN ASPART 100 UNIT/ML IJ SOLN: 5.0000 [IU] | Freq: Three times a day (TID) | INTRAMUSCULAR | Status: DC

## 2024-02-20 MED ORDER — INSULIN ASPART 100 UNIT/ML IJ SOLN
5.0000 [IU] | Freq: Three times a day (TID) | INTRAMUSCULAR | Status: DC
  Administered 2024-02-20: 5 [IU] via SUBCUTANEOUS

## 2024-02-20 MED ORDER — BLOOD GLUCOSE MONITOR KIT: PACK | 0 refills | Status: DC

## 2024-02-20 MED ORDER — NOVOLOG FLEXPEN 100 UNIT/ML ~~LOC~~ SOPN
7.0000 [IU] | PEN_INJECTOR | Freq: Three times a day (TID) | SUBCUTANEOUS | 0 refills | Status: DC
  Filled 2024-02-20: qty 15, 71d supply, fill #0

## 2024-02-20 MED ORDER — BD PEN NEEDLE NANO 2ND GEN 32G X 4 MM MISC
Freq: Four times a day (QID) | 0 refills | Status: DC
Start: 2024-02-20 — End: 2024-03-02
  Filled 2024-02-20: qty 200, 30d supply, fill #0

## 2024-02-20 MED ORDER — INSULIN GLARGINE-YFGN 100 UNIT/ML ~~LOC~~ SOLN
30.0000 [IU] | Freq: Every day | SUBCUTANEOUS | Status: DC
  Administered 2024-02-20: 30 [IU] via SUBCUTANEOUS
  Filled 2024-02-20: qty 0.3

## 2024-02-20 NOTE — Inpatient Diabetes Management (Signed)
 Inpatient Diabetes Program Recommendations  AACE/ADA: New Consensus Statement on Inpatient Glycemic Control (2015)  Target Ranges:  Prepandial:   less than 140 mg/dL      Peak postprandial:   less than 180 mg/dL (1-2 hours)      Critically ill patients:  140 - 180 mg/dL   Lab Results  Component Value Date   GLUCAP 293 (H) 02/20/2024   HGBA1C 14.5 (H) 02/07/2024    Review of Glycemic Control  Latest Reference Range & Units 02/19/24 11:43 02/19/24 12:35 02/19/24 13:33 02/19/24 15:09 02/19/24 17:07 02/19/24 21:02 02/20/24 05:40  Glucose-Capillary 70 - 99 mg/dL 578 (H) 469 (H) 629 (H) 509 (HH) 313 (H) 227 (H) 293 (H)    Latest Reference Range & Units 10/23/23 04:01 01/28/24 11:50 02/07/24 18:32  Hemoglobin A1C 4.8 - 5.6 % 11.4 (H) 14.8 (H) 14.5 (H)   Diabetes history: DM 2 Outpatient Diabetes medications: Semglee  30 units Daily, Novolog  7 units tid with meals Current orders for Inpatient glycemic control: I Semglee  30 units  Novolog  0-15 units tid + hs  Inpatient Diabetes Program Recommendations:    -   Start Novolog  5 units tid meal coverage  Thanks,  Eloise Hake RN, MSN, BC-ADM Inpatient Diabetes Coordinator Team Pager [phone] (8a-5p)

## 2024-02-20 NOTE — Progress Notes (Signed)
 BG = 204, team notified
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
## 2024-02-20 NOTE — Discharge Instructions (Addendum)
 [name],  Thank you for allowing us  to take care of you during your hospital stay.  You came in because your glucose was very elevated and you had an altered mental status.  You were at the ICU for a day for observation, we put you on an insulin  drip, and then as soon as you woke up and were able to tolerate feeds, we transitioned over to cutaneous insulin .  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  This is when you have diabetes and your body does not make any insulin , making you completely dependent on insulin  that is prescribed to you.  Please make sure to take care of your insulin , we are discharging you on the following regimen:  Use 30 units of Lantus  nightly Use 7 units of short acting insulin  at every meal  We have set up an appointment for you to follow-up with us  on Friday.  Come to the internal medicine clinic on 02/24/2024 at 10:15 AM with Dr. Rozelle Corning.  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  You were given intramuscular Haldol  which is an antipsychotic medication.  Did not have any hallucinations  during your stay, however it would be very important for you to continue this medication that is a monthly injection to control your hallucinations.  Please make sure to follow-up with your psychiatrist so you can get your next injection that it is due Mar 09, 2024.    Lease do not hesitate to contact the [facility] clinic if you have any questions or if there is anything we can assist you with.  He has some questions about social work, should you need any paperwork filled please bring them to the clinic at your next appointment.  Our office number is [phone].  Sincerely,  Your internal medicine team  Homeless Shelter List:  [facility]) [address] [place], [place]  Phone: [phone]  [facility] [address] [place], [place] [address] Phone: [phone]  [facility]  (Women only) [address]Margarita Shear [place], [place] [address] Phone: [phone]  [facility] [address], [place] [address] Phone: [phone]  [facility]: [phone]. [address] [place], [place] [address] Phone: [phone]  [facility]  [address] [place], [place] [address] (Check in at 6:00PM for placement at a local shelter) Phone: [phone]  [facility] assistance programs Crisis assistance programs  -[facility] Coordinated Entry Program. If you are experiencing homelessness in [place], [place] , your first point of contact should be Pensions consultant. You can reach Coordinated Entry by calling ([phone] ([phone] or by emailing [email or web address] [email or web address].  Community access points: [employer] [phone] [address], [place]) every Tuesday from 9am-10am. [facility] ([address] [place], [place]) every Wednesday from 8am-9am.   -[place] Coordinated Re-entry Jayson Michael: Dial  211 and request. Offers referrals to homeless shelters in the area.    -The [employer] 9108732514) offers several services to local families, as funding allows. The Emergency Assistance Program (EAP), which they administer, provides household goods, free food, clothing, and financial aid to people in need in the [place]  area. The EAP program does have some qualification, and counselors will interview clients for financial assistance by written referral only. Referrals need to be made by the Department of Social Services or by other EAP approved human services agencies or charities in the area.  -  [facility] of [employer], which can be reached at [phone], offers emergency assistance programs for those in need of help, such as food, rent assistance, a soup kitchen, shelter, and clothing. They are based in [facility] [place]  but  provide a number of services to those that qualify for assistance.   [facility] Department of Social Services may be able to offer temporary financial assistance and cash grants for paying rent and utilities, Help may be provided for local county residents who may be experiencing personal crisis when other resources, including government programs, are not available. Call [phone]  -High [employer] is a [employer] agency, The organization can offer emergency assistance for paying rent, Caremark Rx, utilities, food, household products and furniture. They offer extensive emergency and transitional housing for families, children and single women, and also run a Boy's and [employer]. Thrift Shops, Secondary school teacher, and other aid offered too. [address] [place], [place]  [address], [phone]  -[place] Low Income Energy Assistance Program -- This is offered for [place] families. The federal government created [employer] Program provides a one-time cash grant payment to help eligible low-income families pay their electric and heating bills. [address] [place], [place]  [address], [phone]  -[place] Emergency Assistance -- A program offers emergency utility and rent funds for greater [place] area residents. The program can also provide counseling and referrals to charities and government programs. Also provides food and a free meal program that serves lunch Mondays - Saturdays and dinner seven days per week to individuals in the community. [address], [place], [place]  [address], [phone]  -[employer] - Offers affordable apartment and housing communities across      [place] and [place]. The low income and seniors can access public housing, rental assistance to qualified applicants, and apply for the section 8 rent subsidy program. Other programs  include Chiropractor and Engineer, maintenance. [address] [place], [place]  [address], dial  [phone].  -The [facility] provides transitional housing to veterans and the disabled. Clients will also access other services too, including assistance in applying for Disability, life skills classes, case management, and assistance in finding permanent housing. [address], [place], [place]  [address], call [phone]  -[place] Transitional Housing through [employer] is for people who were just evicted or that are formerly homeless. The non-profit will also help then gain self-sufficiency, find a home or apartment to live in, and also provides information on rent assistance when needed. Phone [phone]  -The [place] [facility] [facility] helps low income, elderly, or disabled residents in seven counties in the [place] ([place], [place], [place], [place], [place], [place], [place], and [place]) save energy and reduce their utility bills by improving energy efficiency. Phone [phone].  -[employer] is located in the [place] Housing Hub in the [employer], [address], [place], [place] [address]. Parking is in the rear of the building. Phone: [phone]   General Email: info@gsohc .org  GHC provides free housing counseling assistance in locating affordable rental housing or housing with support services for families and individuals in crisis and the chronically homeless. We provide potential resources for other housing needs like utilities. Our trained counselors also work with clients on budgeting and financial literacy in effort to empower them to take control of their financial situations. [employer]  Coalition collaborates with homeless service providers and other stakeholders as part of the [facility] COC  (Continuum of Care). The (COC) is a regional/local planning body that coordinates housing and services funding for homeless families and individuals. The role of [facility] in the COC is through housing counseling to work with people we serve on diversion strategies for those that are at imminent risk of becoming homeless. We also work with the Coordinated Assessment/Entry Specialist who attempts to find temporary solutions and/or connects the people to Housing First, Rapid Re-housing or transitional housing programs. Our Homelessness Prevention Housing Counselors meet with clients on business days (Monday-Fridays, except scheduled holidays) from 8:30 am to 4:30 pm.  Legal assistance for evictions, foreclosure, and more -If you need free legal advice on civil issues, such as foreclosures, evictions, Electronics engineer, government programs, domestic issues and more, Armed forces operational officer Aid of [place]  [facility]) is a Associate Professor firm that provides free legal services and counsel to lower income people, seniors, disabled, and others, The goal is to ensure everyone has access to justice and fair representation. Call them at [phone].  [facility] for Housing and Community Studies can provide info about obtaining legal assistance with evictions. Phone [phone].  Data processing manager  The [employer], [employer]. offers job and Dispensing optician. Resources are focused on helping students obtain the skills and experiences that are necessary to compete in today's challenging and tight job market. The non-profit faith-based community action agency offers internship trainings as well as classroom instruction. Classes are tailored to meet the needs of people in the [facility] region. [place], [place] [address], [phone]  Foreclosure prevention/Debt Services Family Services of the [employer] Credit Counseling Service inludes debt and foreclosure prevention programs for local families. This  includes money management, financial advice, budget review and development of a written action plan with a Pensions consultant to help solve specific individual financial problems. In addition, housing and mortgage counselors can also provide pre- and post-purchase homeownership counseling, default resolution counseling (to prevent foreclosure) and reverse mortgage counseling. A Debt Management Program allows people and families with a high level of credit card or medical debt to consolidate and repay consumer debt and loans to creditors and rebuild positive credit ratings and scores. Contact [phone] D7650557.  Community clinics in [address] -Health Department [facility]: [address] [place], [place], [address]. [phone].  -Health Department [place] [facility]: [address], [facility], [address]. [phone].  -[facility] Network offers medical care through a group of doctors, pharmacies and other healthcare related agencies that offer services for low income, uninsured adults in [place]. Also offers adult Dental care and assistance with applying for an [employer]. Call [phone].   [facility]. This center provides low-cost health care to those without health insurance. Services offered include an onsite pharmacy. Phone [phone]. [address], [place].  -Medication Assistance Program serves as a link between pharmaceutical companies and patients to provide low cost or free prescription medications. This service is available for residents who meet certain income restrictions and have no insurance coverage. PLEASE CALL [phone] Jonette Nestle) OR [phone] ([place])  -One Step Further: Materials engineer, The [employer] Support & Nutrition Program, [employer]. Call [phone]/ [phone].  Food pantry and  assistance -Urban Ministry-Food Bank: [address], [place] [address]. Phone [phone]  -[facility] Food Pantry:  [address] [place], [place], [place] [address]. [phone].  -Missionary Ministry: has the purpose of visiting the sick and shut-ins and provide for needs in the surrounding communities. Call [phone]. Email: [email or web address] This program provides: Food box for seniors, Financial assistance, Food to meet basic nutritional needs.  -Meals on Wheels with Senior Resources: [facility] residents age 44 and over who are homebound and unable to obtain and prepare a nutritious meal for themselves are eligible for this service. There may be a waiting list in certain parts of [facility] if the route in that area is full. If you are in [facility] and [place] call [phone] to register. For all other areas call [phone] to register.  -Greater Dietitian: https://findfood.BargainContractor.si  TRANSPORTATION: -[employer] Department of Health: Call [facility] and [employer] at [phone] for details. AttractionGuides.es  -Access [place]: Access [place] is the [employer] Agency's shared-ride transportation service for eligible riders who have a disability that prevents them from riding the fixed route bus. Call [phone]. Access [place] riders must pay a fare of $1.50 per trip, or may purchase a 10-ride punch card for $14.00 ($1.40 per ride) or a 40-ride punch card for $48.00 ($1.20 per ride).  -The [place]'s WHEELS rideshare transportation service is provided for senior citizens (60+) who live independently within [place] city limits and are unable to drive or have limited access to transportation. Call [phone] to schedule an appointment.  -[place] Transportation: For Medicare or Medicaid recipients call [phone]?[facility],  wheelchair Carloyn Chi, and ambulatory quotes available.   FLEEING VIOLENCE: -Family Services of the [place]- 24/7 Crisis line [phone]) -[facility] Justice Centers: (336) [phone]-[phone])  [place] 2-1-1 is another useful way to locate resources in the community. Visit ShedSizes.ch to find service information online. If you need additional assistance, 2-1-1 Referral Specialists are available 24 hours a day, every day by dialing 2-1-1 or [phone] from any phone. The call is free, confidential, and available in any language.  Affordable Housing Search http://www.nchousingsearch.[name])   M-F 8a-3p [address]  [place], [place] [address] [phone] Services include: laundry, barbering, support groups, case management, phone & computer access, showers, AA/NA mtgs, mental health/substance abuse nurse, job skills class, disability information, VA assistance, spiritual classes, etc. Winter Shelter available when temperatures are less than 32 degrees.   HOMELESS SHELTERS [facility] at [facility]- Call [phone] ext. [phone] or ext. [phone]. Located at [address] [place], [place] [address]  [facility] Mens [facility]ter- Call [phone]. Located at [address], [place] [address].  [facility] [employer]. Call [phone]. Office located at [address] [place] [address].  [facility]ways Family Housing through [place] [phone].  [facility]- Call [phone]. Located at [address] [place] [address] [place], [place], [place] [address].  Room at the Inn-For Pregnant mothers. Call [phone]. Located at [address] [place], [address].  [place] [facility]-For men in [place]. Call [phone]. [facility]-Shelter in [place]. Call [phone].  Home of [employer] for [employer] [phone]. Office located at [address], [place], [address].  [employer] be agreeable to help with chores. Call [phone] ext. [phone].  Men's: [address] [place], [place] [address]. Women's: [facility]  [address] [place], [place] [address]  Crisis Services Therapeutic Alternatives Mobile Crisis Management- [phone]  [facility]  [address] [place], [place] [address]. Phone: [phone] Rent/Utility Assistance in [facility]:  INNOVATIVE PATHWAYS [address] [place], [place] [address] [phone] Mon 8:00am - 6:00pm; Tue 8:00am - 6:00pm; Wed 8:00am - 6:00pm; Thu 8:00am - 6:00pm; Fri 8:00am - 6:00pm; Email: [email or web address] Eligibility: Residents of [place], [place], [place], [place], [place] and [place] that meet income limits. Call or text for eligibility screening.   [facility] MINISTRY [address] [place], [place], [place] [address] [phone] (Main: Rental Assistance) [phone] (Main: Utility Assistance) Mon 8:30am - 5:00pm; Tue 8:30am - 5:00pm; Wed 8:30am - 5:00pm; Thu 8:30am - 5:00pm; Fri 8:30am - 5:00pm; Website: http://www.greensborourbanministry.org/emergency-assistance-program Eligibility: People who have an unexpected crisis or emergency that can be verified. Must have some form of income and meet income limits. At the first of the month, only helps with rent/mortgage assistance for those who have court ordered eviction notices. Call for application information. Call for exact documents that will be needed. Examples of documents that may be needed: Photo ID, Social Security cards for everyone in the household, and proof of income for previous 2 months. Copy of eviction notice for rent assistance and copy of final notice for utility assistance. Statements or receipts of bills for previous 2 months.   [facility] - [place] [address] [place], [place] [address] [phone]  (Main) [phone] (Alternate) Mon 9:00am - 5:00pm; Tue 9:00am - 5:00pm; Wed 9:00am - 5:00pm; Thu 9:00am - 5:00pm; Fri 9:00am - 5:00pm; Website: http://[email or web address].salvationarmy.org/[place]/emergency-financial-assistance Email: [email or web address] Eligibility: People experiencing a housing crisis with past-due rent and/or utilities and meet income limits. Must be willing to take part in 6 Call or visit website to download application. Return complete application by mail or email only. Documents: Help with Utilities: Photo ID, proof of household income, copies of monthly bills or receipts, and a final disconnection/shut-off notice. Help with Rent or Mortgage: Photo ID, proof of income, copies of monthly bills or receipts, and eviction notice. Help with Household Goods: Photo ID, proof of household income, copies of monthly bills or receipts, and a fire or flood report.  [facility] [place] [address] [place], [place] [address] [phone] (Main) Mon 8:00am - 5:00pm; Tue 8:00am - 5:00pm; Wed 8:00am - 5:00pm; Thu 8:00am - 5:00pm; Fri 8:00am - 12:00pm; Website: http://southernusa.salvationarmy.org/[place]/emergency-financial-assistance Email: [email or web address] Call for eligibility information. Apply :Utilities Assistance: Visit office by 8:30am on 1st and 4th Monday of each month to pick up application. Rent and Mortgage Assistance: Visit office by 8:30am on 2nd and 3rd Monday of each month to pick up application. NOTE: If Monday falls on a holiday applications can be picked up the following Tuesday. Documents required will be listed on application.  [facility] [phone] (Main) Seen by appointment only. Call for more information. Eligibility: Meet income limits. Apply: Call for information on how to schedule an appointment. Each month there is a specific day to call to schedule an appointment. It  is stated on the agency voicemail message. Appointments fill up quickly each month. Documents: Photo ID, copy of current utility bill.  [facility] HANDS [place] [address] [place], [place] [address] [phone] (Main) Tue 9:00am - 4:00pm; Wed 9:00am - 4:00pm; Thu 9:00am - 4:00pm; Website: http://www.helpinghandshighpoint.org Email: [email or web address]@gmail [email or web address] Eligibility: Utility Assistance: Meet income limits and be a Holiday representative. [employer] customers do not qualify. Must not have received utility assistance for another agency within the last  90 days. Rent Assistance: Residents of [place] who meet income limits. Must not have received rent assistance for another agency within the last 90 days. Apply: Call to schedule an appointment. Documents: Utility Assistance: Photo ID, City of [employer], copy of lease (if not paying a mortgage), proof of income, and monthly expenses. Rent Assistance: Photo ID, W-9 from the landlord, copy of the lease, proof of income, and a list of monthly expenses.  [facility] - [place] [address], [place], [place] [address] [phone] (Main: Help With Rent) [phone] (Main: Help With Utilities) Mon 9:00am - 4:00pm; Tue 9:00am - 4:00pm; Wed 9:00am - 4:00pm; Thu 9:00am - 4:00pm; Fri 9:00am - 4:00pm; Website: MotivationalSites.no Email: [email or web address] Eligibility: People experiencing a financial crisis. Apply: Call to schedule an appointment Wednesday, 7:30am. Documents: Photo ID, Social Security card, proof of income, and proof of address. Other documents may be required, depending on service. Call for more information.  LOW INCOME ENERGY ASSISTANCE PROGRAM DEPARTMENT OF SOCIAL SERVICES - [facility] [address], [place], [place] [address] [phone] (Main) Mon 8:00am - 5:00pm; Tue 8:00am - 5:00pm; Wed  8:00am - 5:00pm; Thu 8:00am - 5:00pm; Fri 8:00am - 5:00pm; Website: [email or web address] Eligibility: Meet income limits and resource guidelines. Each household is only eligible once, even if multiple members apply. Apply: Call to see if funds are available. Visit to complete an application, call to have 1 mailed, or apply online at [email or web address] NOTE: Households with a person age 42 and over or a person with a documented disability can apply beginning December 1. Other households can apply beginning January 1. Documents: Photo ID, birth certificate, proof of household income, copy of utility bill, latest bank statement, the names and Social Security numbers for everyone in the household, and proof of disability if under age 58.  LOW INCOME ENERGY ASSISTANCE PROGRAM DEPARTMENT OF SOCIAL SERVICES - [facility] [address] [place], [place], [place] [address] [phone] (Main) Mon 8:00am - 5:00pm; Tue 8:00am - 5:00pm; Wed 8:00am - 5:00pm; Thu 8:00am - 5:00pm; Fri 8:00am - 5:00pm; Website: [email or web address] Eligibility: Meet income limits and resource guidelines. Each household is only eligible once, even if multiple members apply. Apply: Call to see if funds are available. Visit to complete an application, call to have 1 mailed, or apply online at [email or web address] NOTE: Households with a person age 29 and over or a person with a documented disability can apply beginning December 1. Other households can apply beginning January 1. Documents: Photo ID, birth certificate, proof of household income, copy of utility bill, latest bank statement, the names and Social Security numbers for everyone in the household, and proof of disability if under age 40.  [place]  [place] URBAN MINISTRY Address: [address] [place], [place] [address] Phone Number:  [phone] Hours of Operation: Residents of [place] can come to obtain food Monday through Friday from 8:30am until 3:30pm. Photo ID and Social Security cards required for all residents of a household. Can come six times a year  THE BLESSED TABLE Address: [address] [place], [place] [address] Phone Number: [phone] Hours of Operation: Operates Tuesday-Friday 10:00 a.m. to 1 p.m. Requirements: Referral from DSS needed. May come 6 times a year, 30 days apart. Photo ID and SS required for all residents of household.  [facility] MINISTRIES Address: [address] [place], [place] [address] Phone Number: [phone] Hours of Operation: Food pantry is open on the last Saturday of each month  from 10:00 am - 12:00 noon. No appointment needed. No qualifications.  [facility] Address: [address] [place], [place] [address] Phone Number: [phone]. [phone] Hours of Operation: Must make reservations to pick up food on Saturdays. Sign ups for Saturday pick up beginning at 8:30 a.m. on Monday morning.  [facility] Address: [address] [place], [place] [address] Phone Number: [phone] Hours of Operation: If you need food, bring proper identification such as a driver's license to receive a bag of food once a month. Requirements: Can come once every 30 days with referral DSS, Holiday representative, Mental health etc. Each referral good for six visits. Photo ID required. *1st visit no referral required.  [facility] Address: [address], [place] [address] Phone Number: [phone]  [place] [place] [facility] Address: [address] [place], [place] [address] Phone Number: [phone] Hours of Operation:  You can register at [email or web address] for free groceries  [facility] Address: [address], [place] [address] Phone Number: [phone] Hours of Operation: Drive  through giveaway, first come first served. Every 3rd Saturday 11AM - 1PM  [facility] [address] Address: [address], [place] [address] Phone Number: [phone]   [place]  [facility] Address: [address] [place], [place] [address] Phone Number: [phone] Hours of Operation: Once a month every 3rd Saturday  [facility] Address: [address] [place], [place] [address] Phone Number: [phone] Hours of Operation: Distribution happens from 9:00-10:00 a.m. every Saturday.     [facility] Address: [address] [facility] [place], [place] [address] Phone Number: [phone] Hours of Operation: ONCE a week for the community food distribution held every Tuesday, Wednesday and Thursday from 11 a.m. - 2:00 p.m. Food is available on a first come, first serve basis and varies week to week. No appointment necessary for drive thru pick up.  [facility] Address: [address] [place], [place] [address] Phone Number: [phone] Hours of Operation: Open every 3rd Thursday 9:30 a.m. - 11:00 a.m.  [facility] Address: [address] [place], [place] [address] Phone Number: [phone] Hours of Operation: Please call for hours, directions, and questions  [facility] [place] [facility] Address: [address], [place], [place]  [address] Phone Number: [phone] Website: https://www.Hollyguns.co.za Food Finder app: https://findfood.ghpfa.org  [facility] Address: [address] [place], [place] [address] Phone Number: [phone] Hours of Operation: Contact Bree Harpe. Enrolled Substance Abuse Clients Only  [facility] Address: [address] [place], [address]  Phone Number: [phone] Hours of Operation: Contact Alene Ana. Food pantry open the 3rd Saturday of each month from 9 a.m. -12 p.m. only  [place] [facility] Address: [address] [place], [place] [address] Phone  Number: [phone] Hours of Operation: Contact Loletta Ripple. Emergency food bank open on Saturdays by appointment only  [facility] Address: [address] [place], [place] [address] Phone Number: [phone] Hours of Operation: No specific contact person; Anyone can help  [facility] Address: [address] [place], [place] [address] Phone Number: [phone] Hours of Operation: Contact Julia Oats. Agency gives out a bag of food every Thursday from 2-4 p.m. only, and also provides a community meal every Thursday between 5-6 p.m. Other services provided include rent/mortgage and utility assistance, women's winter shelter, thrift store, and senior adult activities.  [facility] [place] Address: [address] [place], [place] [address] Phone Number: [phone] Hours of Operation: The Emergency Food Assistance Program provides individuals and families with a generous supply of food including meat, fresh vegetables, and nonperishable items. The food box contains five days' worth of food, and each family or individual can receive a box once per month. M, W, Th, Fr 11am-2pm, walk-ins welcome.  [facility] Address: [address] [place], [place] [address]  Phone Number: [phone] Hours of Operation: Contact Asia Blanca Bunch. Tuesdays and Thursdays from 11am - 3pm by appointment only .   [employer] FOR TRANSPORTATION: [phone]  ACT TEAMS IN [place]:  -[facility]: Address: Chief Financial Officer at [facility], [address], [place], [place] [address] Phone: [phone] Hours: Monday-Friday 8:00AM-12:00PM; 1:00PM-5:00PM Website: [email or web address]  -[facility] Address: [employer], [address], [address] [place], [place] [address] Phone: [phone] Email: [email or web address]/ Website:  https://www.psychotherapeuticservices.com/index.php/programs/[place]/ncgactt Online Application: https://form.jotform.6574785276  -[facility] [employer]: Address: [address], [place], [place] [address] Phone: [phone] Crisis Phone: [phone] if no answer call [phone] Website: ReportBrain.cz Referrals: bitchilla.com or call [phone]  -[facility] [place] Address: [address], [place], [place] [address] Phone: [phone] Website: https://www.pwstolife.com/mental-health-services/adult-services  -[facility]: Address: [address] [place], [place], [place] [address] Admin Hours: Mon-Fri 8AM to [facility] Center Hours: 24/7 Phone: [phone] Website: https://www.[facility]mark[facility].org/services/adult-specific/assertive-community-treatment

## 2024-02-20 NOTE — Plan of Care (Signed)
  Problem: Education: Goal: Knowledge of General Education information will improve Description: Including pain rating scale, medication(s)/side effects and non-pharmacologic comfort measures Outcome: Progressing   Problem: Health Behavior/Discharge Planning: Goal: Ability to manage health-related needs will improve Outcome: Progressing   Problem: Clinical Measurements: Goal: Ability to maintain clinical measurements within normal limits will improve Outcome: Progressing Goal: Will remain free from infection Outcome: Progressing Goal: Diagnostic test results will improve Outcome: Progressing Goal: Respiratory complications will improve Outcome: Progressing Goal: Cardiovascular complication will be avoided Outcome: Progressing   Problem: Activity: Goal: Risk for activity intolerance will decrease Outcome: Progressing   Problem: Coping: Goal: Level of anxiety will decrease Outcome: Progressing   Problem: Elimination: Goal: Will not experience complications related to bowel motility Outcome: Progressing Goal: Will not experience complications related to urinary retention Outcome: Progressing   Problem: Pain Managment: Goal: General experience of comfort will improve and/or be controlled Outcome: Progressing   Problem: Safety: Goal: Ability to remain free from injury will improve Outcome: Progressing   Problem: Skin Integrity: Goal: Risk for impaired skin integrity will decrease Outcome: Progressing   Problem: Safety: Goal: Non-violent Restraint(s) Outcome: Progressing   Problem: Education: Goal: Ability to describe self-care measures that may prevent or decrease complications (Diabetes Survival Skills Education) will improve Outcome: Progressing Goal: Individualized Educational Video(s) Outcome: Progressing   Problem: Coping: Goal: Ability to adjust to condition or change in health will improve Outcome: Progressing   Problem: Fluid Volume: Goal: Ability to  maintain a balanced intake and output will improve Outcome: Progressing   Problem: Health Behavior/Discharge Planning: Goal: Ability to identify and utilize available resources and services will improve Outcome: Progressing Goal: Ability to manage health-related needs will improve Outcome: Progressing   Problem: Metabolic: Goal: Ability to maintain appropriate glucose levels will improve Outcome: Progressing   Problem: Nutritional: Goal: Progress toward achieving an optimal weight will improve Outcome: Progressing   Problem: Skin Integrity: Goal: Risk for impaired skin integrity will decrease Outcome: Progressing   Problem: Tissue Perfusion: Goal: Adequacy of tissue perfusion will improve Outcome: Progressing

## 2024-02-20 NOTE — TOC Initial Note (Signed)
 Transition of Care [facility]) - Initial/Assessment Note    Patient Details  Name: Jacob [name] MRN: [id number] Date of Birth: 07/01/1991  Transition of [facility]) CM/SW Contact:    Juliane Och, LCSW Phone Number: 02/20/2024, 1:25 PM  Clinical Narrative:                  1:25 PM CSW introduced self and role to patient and patient's mother, Adriana Hopping, at patient's bedside. Patient's mother stated that patient is on waitlist for a tiny house through [facility]. Patient confirmed he has stayed at [facility] in the past but is not interested in returning. Patient's mother, Adriana Hopping, stated that patient could possibly stay  with brother while he is on waitlist for a tiny house and planned to contact brother. [name] and patient made aware of expected discharge today. Patient stated that he receives food stamps and has applied for disability. CSW followed up on SDOH needs (food, housing, transportation, utilities). Patient confirmed needs and expressed interest in resources. CSW provided printed SDOH resources and added them to AVS. Patient's mother stated patient is active with [facility] and expressed interest in other ACT Teams in [place]. CSW added list of [facility] ACT Teams to patient's AVS.  Expected Discharge Plan: Home/Self Care Barriers to Discharge: Barriers Resolved   Patient Goals and CMS Choice            Expected Discharge Plan and Services In-house Referral: Clinical Social Work     Living arrangements for the past 2 months: Homeless Shelter, Homeless Expected Discharge Date: 02/20/24                                    Prior Living Arrangements/Services Living arrangements for the past 2 months: Homeless Shelter, Homeless Lives with:: Self Patient language and need for interpreter reviewed:: Yes        Need for Family Participation in Patient Care: No (Comment) Care giver support system in place?: No (comment)    Criminal Activity/Legal Involvement Pertinent to Current Situation/Hospitalization: No - Comment as needed  Activities of Daily Living   ADL Screening (condition at time of admission) Independently performs ADLs?: Yes (appropriate for developmental age) Is the patient deaf or have difficulty hearing?: No Does the patient have difficulty seeing, even when wearing glasses/contacts?: No Does the patient have difficulty concentrating, remembering, or making decisions?: No  Permission Sought/Granted Permission sought to share information with : Family Supports Permission granted to share information with : Yes, Verbal Permission Granted  Share Information with NAME: Octavio Ben     Permission granted to share info w Relationship: Mother  Permission granted to share info w Contact Information: [phone]  Emotional Assessment Appearance:: Appears stated age Attitude/Demeanor/Rapport: Engaged Affect (typically observed): Appropriate, Accepting, Adaptable, Calm, Pleasant, Stable Orientation: : Oriented to Self, Oriented to Place, Oriented to  Time, Oriented to Situation Alcohol / Substance Use: Not Applicable Psych Involvement: No (comment)  Admission diagnosis:  Acute hyperkalemia [E87.5] DKA (diabetic ketoacidosis) (HCC) [E11.10] 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] Type 1 diabetes mellitus with ketoacidotic coma (HCC) [E10.11] Patient Active Problem List   Diagnosis Date Noted   Tobacco abuse 02/18/2024   Diabetic polyneuropathy associated with type 2 diabetes mellitus (HCC) 02/18/2024   DKA (diabetic ketoacidosis) (HCC) 02/18/2024   Vitamin D  deficiency 01/10/2024   Malingering 12/27/2023   Psychosis (HCC) 12/26/2023   Homeless  12/17/2023   AKI (acute kidney injury) (HCC) 12/17/2023   Homelessness 12/17/2023   Influenza A 11/30/2023   Uncontrolled type 1 diabetes mellitus with hyperglycemia, with long-term current use of insulin  (HCC) 10/10/2023   Blister  (nonthermal), right foot, initial encounter 08/01/2023   Generalized anxiety disorder 08/31/2022   Tobacco dependence 08/31/2022   Schizoaffective disorder, bipolar type (HCC) 12/03/2021   Uncontrolled type 1 diabetes mellitus with hypoglycemia without coma (HCC) 06/06/2020   Polycythemia 03/04/2020   MDD (major depressive disorder), recurrent, severe, with psychosis (HCC)    PCP:  Jayson Michael, MD Pharmacy:   [facility] [address], [place] [facility] [address] [facility] [address] [place] [address] Phone: [phone] Fax: [phone]  [facility] [address] [place] [address] Phone: [phone] Fax: [phone]  [facility] [facility] [address] [place] [address] Phone: [phone] Fax: [phone]     Social Drivers of Health (SDOH) Social History: SDOH Screenings   Food Insecurity: Food Insecurity Present (02/19/2024)  Housing: High Risk (02/19/2024)  Transportation Needs: Unmet Transportation Needs (02/19/2024)  Utilities: At Risk (02/19/2024)  Alcohol Screen: Low Risk  (02/01/2024)  Depression (PHQ2-9): Low Risk  (02/14/2024)  Social Connections: Socially Isolated (12/26/2023)  Tobacco Use: Medium Risk (02/18/2024)   SDOH Interventions: Food Insecurity Interventions: Programmer, applications Provided, Inpatient TOC Housing Interventions: [employer] Provided, Inpatient [employer] Transportation Interventions: [employer] Provided, Inpatient TOC Utilities Interventions: [employer] Provided, Inpatient TOC   Readmission Risk Interventions     No data to display

## 2024-02-22 NOTE — Telephone Encounter (Signed)
 Attempted second PA, per insurance:   Closed by health plan.The medication you are requesting on this prior authorization form has already been reviewed on 02/15/2024. Please contact Provider Services at [phone] for assistance.  An appeal or peer to peer may be needed by provider.

## 2024-02-23 LAB — CULTURE, BLOOD (ROUTINE X 2)
Culture: NO GROWTH
Culture: NO GROWTH
Special Requests: ADEQUATE
Special Requests: ADEQUATE

## 2024-02-24 ENCOUNTER — Observation Stay (HOSPITAL_COMMUNITY)
Admission: EM | Admit: 2024-02-24 | Discharge: 2024-02-24 | Discharge status: Against Medical Advice | Payer: MEDICAID | Attending: Internal Medicine | Admitting: Internal Medicine

## 2024-02-24 ENCOUNTER — Other Ambulatory Visit: Payer: Self-pay

## 2024-02-24 ENCOUNTER — Encounter: Payer: MEDICAID | Admitting: Internal Medicine

## 2024-02-24 DIAGNOSIS — F259 Schizoaffective disorder, unspecified: Secondary | ICD-10-CM | POA: Diagnosis present

## 2024-02-24 DIAGNOSIS — Z79899 Other long term (current) drug therapy: Secondary | ICD-10-CM | POA: Diagnosis not present

## 2024-02-24 DIAGNOSIS — Z5900 Homelessness unspecified: Secondary | ICD-10-CM | POA: Diagnosis not present

## 2024-02-24 DIAGNOSIS — Z87891 Personal history of nicotine dependence: Secondary | ICD-10-CM | POA: Diagnosis not present

## 2024-02-24 DIAGNOSIS — Z91141 Patient's other noncompliance with medication regimen due to financial hardship: Secondary | ICD-10-CM

## 2024-02-24 DIAGNOSIS — G9341 Metabolic encephalopathy: Secondary | ICD-10-CM | POA: Insufficient documentation

## 2024-02-24 DIAGNOSIS — E1110 Type 2 diabetes mellitus with ketoacidosis without coma: Secondary | ICD-10-CM | POA: Diagnosis present

## 2024-02-24 DIAGNOSIS — Z5329 Procedure and treatment not carried out because of patient's decision for other reasons: Secondary | ICD-10-CM | POA: Diagnosis present

## 2024-02-24 DIAGNOSIS — F411 Generalized anxiety disorder: Secondary | ICD-10-CM | POA: Diagnosis not present

## 2024-02-24 DIAGNOSIS — E875 Hyperkalemia: Secondary | ICD-10-CM | POA: Diagnosis present

## 2024-02-24 DIAGNOSIS — E871 Hypo-osmolality and hyponatremia: Secondary | ICD-10-CM | POA: Diagnosis present

## 2024-02-24 DIAGNOSIS — Z794 Long term (current) use of insulin: Secondary | ICD-10-CM | POA: Diagnosis not present

## 2024-02-24 DIAGNOSIS — E1010 Type 1 diabetes mellitus with ketoacidosis without coma: Principal | ICD-10-CM | POA: Diagnosis present

## 2024-02-24 LAB — I-STAT CHEM 8, ED
BUN: 33 mg/dL — ABNORMAL HIGH (ref 6–20)
BUN: 30 mg/dL — ABNORMAL HIGH (ref 6–20)
Calcium, Ion: 1.21 mmol/L (ref 1.15–1.40)
Calcium, Ion: 1.22 mmol/L (ref 1.15–1.40)
Chloride: 94 mmol/L — ABNORMAL LOW (ref 98–111)
Chloride: 95 mmol/L — ABNORMAL LOW (ref 98–111)
Creatinine, Ser: 1.3 mg/dL — ABNORMAL HIGH (ref 0.61–1.24)
Creatinine, Ser: 1.1 mg/dL (ref 0.61–1.24)
Glucose, Bld: >700 mg/dL (ref 70–99)
Glucose, Bld: 509 mg/dL (ref 70–99)
HCT: 50 % (ref 39.0–52.0)
HCT: 44 % (ref 39.0–52.0)
Hemoglobin: 17 g/dL (ref 13.0–17.0)
Hemoglobin: 15 g/dL (ref 13.0–17.0)
Potassium: 5.6 mmol/L — ABNORMAL HIGH (ref 3.5–5.1)
Potassium: 5 mmol/L (ref 3.5–5.1)
Sodium: 128 mmol/L — ABNORMAL LOW (ref 135–145)
Sodium: 131 mmol/L — ABNORMAL LOW (ref 135–145)
TCO2: 15 mmol/L — ABNORMAL LOW (ref 22–32)
TCO2: 20 mmol/L — ABNORMAL LOW (ref 22–32)

## 2024-02-24 LAB — URINALYSIS, ROUTINE W REFLEX MICROSCOPIC
Bacteria, UA: NONE SEEN
Bilirubin Urine: NEGATIVE
Glucose, UA: >=500 mg/dL — AB
Hgb urine dipstick: NEGATIVE
Ketones, ur: 80 mg/dL — AB
Leukocytes,Ua: NEGATIVE
Nitrite: NEGATIVE
Protein, ur: NEGATIVE mg/dL
Specific Gravity, Urine: 1.022 (ref 1.005–1.030)
pH: 5 (ref 5.0–8.0)

## 2024-02-24 LAB — COMPREHENSIVE METABOLIC PANEL WITH GFR
ALT: 73 U/L — ABNORMAL HIGH (ref 0–44)
AST: 58 U/L — ABNORMAL HIGH (ref 15–41)
Albumin: 4.6 g/dL (ref 3.5–5.0)
Alkaline Phosphatase: 99 U/L (ref 38–126)
Anion gap: 32 — ABNORMAL HIGH (ref 5–15)
BUN: 32 mg/dL — ABNORMAL HIGH (ref 6–20)
CO2: 12 mmol/L — ABNORMAL LOW (ref 22–32)
Calcium: 10.2 mg/dL (ref 8.9–10.3)
Chloride: 85 mmol/L — ABNORMAL LOW (ref 98–111)
Creatinine, Ser: 2.15 mg/dL — ABNORMAL HIGH (ref 0.61–1.24)
GFR, Estimated: 41 mL/min — ABNORMAL LOW (ref >60)
Glucose, Bld: 868 mg/dL (ref 70–99)
Potassium: 6.1 mmol/L — ABNORMAL HIGH (ref 3.5–5.1)
Sodium: 129 mmol/L — ABNORMAL LOW (ref 135–145)
Total Bilirubin: 2.3 mg/dL — ABNORMAL HIGH (ref 0.0–1.2)
Total Protein: 8.7 g/dL — ABNORMAL HIGH (ref 6.5–8.1)

## 2024-02-24 LAB — I-STAT VENOUS BLOOD GAS, ED
Acid-base deficit: 14 mmol/L — ABNORMAL HIGH (ref 0.0–2.0)
Acid-base deficit: 6 mmol/L — ABNORMAL HIGH (ref 0.0–2.0)
Bicarbonate: 14.1 mmol/L — ABNORMAL LOW (ref 20.0–28.0)
Bicarbonate: 20 mmol/L (ref 20.0–28.0)
Calcium, Ion: 1.2 mmol/L (ref 1.15–1.40)
Calcium, Ion: 1.21 mmol/L (ref 1.15–1.40)
HCT: 48 % (ref 39.0–52.0)
HCT: 42 % (ref 39.0–52.0)
Hemoglobin: 16.3 g/dL (ref 13.0–17.0)
Hemoglobin: 14.3 g/dL (ref 13.0–17.0)
O2 Saturation: 61 %
O2 Saturation: 99 %
Potassium: 5.6 mmol/L — ABNORMAL HIGH (ref 3.5–5.1)
Potassium: 5 mmol/L (ref 3.5–5.1)
Sodium: 126 mmol/L — ABNORMAL LOW (ref 135–145)
Sodium: 129 mmol/L — ABNORMAL LOW (ref 135–145)
TCO2: 15 mmol/L — ABNORMAL LOW (ref 22–32)
TCO2: 21 mmol/L — ABNORMAL LOW (ref 22–32)
pCO2, Ven: 39.4 mmHg — ABNORMAL LOW (ref 44–60)
pCO2, Ven: 39.9 mmHg — ABNORMAL LOW (ref 44–60)
pH, Ven: 7.162 — CL (ref 7.25–7.43)
pH, Ven: 7.307 (ref 7.25–7.43)
pO2, Ven: 40 mmHg (ref 32–45)
pO2, Ven: 131 mmHg — ABNORMAL HIGH (ref 32–45)

## 2024-02-24 LAB — CBC
HCT: 47 % (ref 39.0–52.0)
Hemoglobin: 14.8 g/dL (ref 13.0–17.0)
MCH: 28.8 pg (ref 26.0–34.0)
MCHC: 31.5 g/dL (ref 30.0–36.0)
MCV: 91.4 fL (ref 80.0–100.0)
Platelets: 408 10*3/uL — ABNORMAL HIGH (ref 150–400)
RBC: 5.14 MIL/uL (ref 4.22–5.81)
RDW: 12.1 % (ref 11.5–15.5)
WBC: 18.3 10*3/uL — ABNORMAL HIGH (ref 4.0–10.5)
nRBC: 0 % (ref 0.0–0.2)

## 2024-02-24 LAB — BASIC METABOLIC PANEL WITH GFR
Anion gap: 22 — ABNORMAL HIGH (ref 5–15)
Anion gap: 13 (ref 5–15)
BUN: 27 mg/dL — ABNORMAL HIGH (ref 6–20)
BUN: 20 mg/dL (ref 6–20)
CO2: 18 mmol/L — ABNORMAL LOW (ref 22–32)
CO2: 26 mmol/L (ref 22–32)
Calcium: 9.9 mg/dL (ref 8.9–10.3)
Calcium: 10 mg/dL (ref 8.9–10.3)
Chloride: 91 mmol/L — ABNORMAL LOW (ref 98–111)
Chloride: 95 mmol/L — ABNORMAL LOW (ref 98–111)
Creatinine, Ser: 1.62 mg/dL — ABNORMAL HIGH (ref 0.61–1.24)
Creatinine, Ser: 1.19 mg/dL (ref 0.61–1.24)
GFR, Estimated: 57 mL/min — ABNORMAL LOW (ref >60)
GFR, Estimated: >60 mL/min (ref >60)
Glucose, Bld: 469 mg/dL — ABNORMAL HIGH (ref 70–99)
Glucose, Bld: 182 mg/dL — ABNORMAL HIGH (ref 70–99)
Potassium: 5.4 mmol/L — ABNORMAL HIGH (ref 3.5–5.1)
Potassium: 4.3 mmol/L (ref 3.5–5.1)
Sodium: 131 mmol/L — ABNORMAL LOW (ref 135–145)
Sodium: 134 mmol/L — ABNORMAL LOW (ref 135–145)

## 2024-02-24 LAB — BETA-HYDROXYBUTYRIC ACID
Beta-Hydroxybutyric Acid: 7.16 mmol/L — ABNORMAL HIGH (ref 0.05–0.27)
Beta-Hydroxybutyric Acid: 2.4 mmol/L — ABNORMAL HIGH (ref 0.05–0.27)

## 2024-02-24 LAB — RAPID URINE DRUG SCREEN, HOSP PERFORMED
Amphetamines: NOT DETECTED
Barbiturates: NOT DETECTED
Benzodiazepines: NOT DETECTED
Cocaine: NOT DETECTED
Opiates: NOT DETECTED
Tetrahydrocannabinol: NOT DETECTED

## 2024-02-24 LAB — CBG MONITORING, ED
Glucose-Capillary: >600 mg/dL (ref 70–99)
Glucose-Capillary: 303 mg/dL — ABNORMAL HIGH (ref 70–99)
Glucose-Capillary: 203 mg/dL — ABNORMAL HIGH (ref 70–99)
Glucose-Capillary: 131 mg/dL — ABNORMAL HIGH (ref 70–99)
Glucose-Capillary: 127 mg/dL — ABNORMAL HIGH (ref 70–99)

## 2024-02-24 MED ORDER — INSULIN GLARGINE-YFGN 100 UNIT/ML ~~LOC~~ SOLN
30.0000 [IU] | Freq: Every day | SUBCUTANEOUS | Status: DC
  Administered 2024-02-24: 30 [IU] via SUBCUTANEOUS
  Filled 2024-02-24: qty 0.3

## 2024-02-24 MED ORDER — LACTATED RINGERS IV SOLN: INTRAVENOUS | Status: DC

## 2024-02-24 MED ORDER — HALOPERIDOL LACTATE 5 MG/ML IJ SOLN: 1.0000 mg | Freq: Four times a day (QID) | INTRAMUSCULAR | Status: DC | PRN

## 2024-02-24 MED ORDER — DEXTROSE IN LACTATED RINGERS 5 % IV SOLN: INTRAVENOUS | Status: DC

## 2024-02-24 MED ORDER — INSULIN REGULAR(HUMAN) IN NACL 100-0.9 UT/100ML-% IV SOLN
INTRAVENOUS | Status: DC
  Administered 2024-02-24: 8.5 [IU]/h via INTRAVENOUS
  Administered 2024-02-24: 4.2 [IU]/h via INTRAVENOUS
  Filled 2024-02-24: qty 100

## 2024-02-24 MED ORDER — INSULIN ASPART 100 UNIT/ML IJ SOLN
0.0000 [IU] | Freq: Three times a day (TID) | INTRAMUSCULAR | Status: DC
  Administered 2024-02-24: 2 [IU] via SUBCUTANEOUS

## 2024-02-24 MED ORDER — ENOXAPARIN SODIUM 40 MG/0.4ML IJ SOSY: 40.0000 mg | PREFILLED_SYRINGE | INTRAMUSCULAR | Status: DC

## 2024-02-24 MED ORDER — INSULIN ASPART 100 UNIT/ML IJ SOLN: 0.0000 [IU] | Freq: Every day | INTRAMUSCULAR | Status: DC

## 2024-02-24 MED ORDER — DEXTROSE 50 % IV SOLN: 0.0000 mL | INTRAVENOUS | Status: DC | PRN

## 2024-02-24 MED ORDER — LACTATED RINGERS IV BOLUS
20.0000 mL/kg | Freq: Once | INTRAVENOUS | Status: AC
  Administered 2024-02-24: 1186 mL via INTRAVENOUS

## 2024-02-24 MED ORDER — CALCIUM GLUCONATE 10 % IV SOLN
1.0000 g | Freq: Once | INTRAVENOUS | Status: AC
  Administered 2024-02-24: 1 g via INTRAVENOUS
  Filled 2024-02-24: qty 10

## 2024-02-24 MED ORDER — HALOPERIDOL 1 MG PO TABS
1.0000 mg | ORAL_TABLET | Freq: Four times a day (QID) | ORAL | Status: DC | PRN
Start: 2024-02-24 — End: 2024-02-25

## 2024-02-24 NOTE — H&P (Addendum)
 Date: 02/24/2024               Patient Name:  Jacob [name] MRN: [id number]  DOB: 1991-08-25 Age / Sex: 33 y.o., male   PCP: Jayson Michael, MD         Medical Service: Internal Medicine Teaching Service         Attending Physician: Dr. Cherylene Corrente, MD      First Contact: Dr. Jayson Michael    Second Contact: Dr. Lorelle Roll         After Hours (After 5p/  First Contact Pager: [phone]  weekends / holidays): Second Contact Pager: [phone]   SUBJECTIVE   Chief Complaint: Confusion  History of Present Illness:   Mr. Renaud Massengale is a 33 year old male with past medical history of T1DM with multiple admissions for DKA and schizoaffective disorder who presented to the emergency department with complaints of "not feeling well" and nausea. He was recently admitted on April 19 through April 21 for diabetic ketoacidosis, that required ICU stay.  This admission was also complicated by acute agitation.  He was discharged on Lantus  30 units daily, and NovoLog  7 units 3 times daily with meals.  He has not followed up with [place] [facility].  I was unable to elicit much information from him, as he was very lethargic.  However, he did say he had not been feeling well for the last few days.  He does know his full name, is able to state that he is in the hospital.   ED Course: IV Insulin  endotool started  Medications: (Unclear if patient is taking) Lantus  30 units daily NovoLog  7 units 3 times a day with meals Intramuscular Haldol  monthly, next dose due May 9 Trazodone  50 mg Benztropine  0.5 mg twice a day   Past Medical History Type 1 diabetes Schizoaffective disorder  No past surgical history on file.  Social:  Lives With:Unhomed Occupation: Does not work  Support: Mother in the area Level of Function: Independent in all ADLs/IADLs when not acutely ill PCP: Dr. Jayson Michael, MD Substances: Per chart review, occasional tobacco use  Family History: Unable to obtain  information  Allergies: Allergies as of 02/24/2024   (No Known Allergies)    Review of Systems: A complete ROS was negative except as per HPI.   OBJECTIVE:   Physical Exam: Blood pressure 125/80, pulse 89, temperature 98.6 F (37 C), temperature source Oral, resp. rate 16, SpO2 100%.  Constitutional: Lethargic male, in no acute distress HENT: normocephalic atraumatic, mucous membranes dry Eyes: conjunctiva non-erythematous Neck: supple Cardiovascular: regular rate and rhythm, no m/r/g Pulmonary/Chest: normal work of breathing on room air, lungs clear to auscultation bilaterally Abdominal: soft, non-tender, non-distended MSK: normal bulk and tone Neurological: alert & oriented x 2, 5/5 strength in bilateral upper and lower extremities Skin: warm and dry Psych: Lethargic, does respond to questions  Labs: CBC    Component Value Date/Time   WBC 18.3 (H) 02/24/2024 1124   RBC 5.14 02/24/2024 1124   HGB 14.3 02/24/2024 1345   HGB 15.0 02/24/2024 1345   HCT 42.0 02/24/2024 1345   HCT 44.0 02/24/2024 1345   PLT 408 (H) 02/24/2024 1124   MCV 91.4 02/24/2024 1124   MCH 28.8 02/24/2024 1124   MCHC 31.5 02/24/2024 1124   RDW 12.1 02/24/2024 1124   LYMPHSABS 1.7 02/18/2024 1236   MONOABS 1.4 (H) 02/18/2024 1236   EOSABS 0.0 02/18/2024 1236   BASOSABS 0.1 02/18/2024 1236  CMP     Component Value Date/Time   NA 134 (L) 02/24/2024 1514   NA 131 (L) 11/06/2018 1736   K 4.3 02/24/2024 1514   CL 95 (L) 02/24/2024 1514   CO2 26 02/24/2024 1514   GLUCOSE 182 (H) 02/24/2024 1514   BUN 20 02/24/2024 1514   BUN 17 11/06/2018 1736   CREATININE 1.19 02/24/2024 1514   CALCIUM  10.0 02/24/2024 1514   PROT 8.7 (H) 02/24/2024 1124   ALBUMIN 4.6 02/24/2024 1124   AST 58 (H) 02/24/2024 1124   ALT 73 (H) 02/24/2024 1124   ALKPHOS 99 02/24/2024 1124   BILITOT 2.3 (H) 02/24/2024 1124   GFRNONAA >60 02/24/2024 1514   GFRAA >60 07/28/2020 2100    Imaging: No imaging done  EKG:  personally reviewed my interpretation is sinus tachycardia, elevated T waves, similar to previous EKG  ASSESSMENT & PLAN:   Assessment & Plan by Problem: Active Problems:   DKA (diabetic ketoacidosis) (HCC)   Kerrion Aubuchon is a 33 y.o. person living with a history of Type 1 diabetes who presented altered and admitted for diabetic ketoacidosis on hospital day 0  #Diabetic Ketoacidosis #Type 1 Diabetes Mellitus Pt presented to the emergency department and found to have glucose >600, acidotic at 7.162, and an elevated Beta-hydroxybutryic acid at 7.16, supportive for diabetic ketoacidosis. He was previously admitted from 4/19-4/21 with DKA that required ICU stay. On exam today he is lethargic, and difficult to say if he has been adherent to his insulin  regimen of Lantus  30U and Novolog  7U TID on discharge last time. Unfortunately, he has multiple negative social determinants of health such as affordability of medications and currently being unhoused. Regardless, will continue endotool until he is more alert and ready to eat before transitioning him to Lantus  30.   Plan:  - Continue endotool for now - Transition off once more alert and Anion gap closed x2 with 30U Lantus   - Will add on sliding scale at that time - Monitor electrolytes -   #Schizoaffective Disorder  Hx of paranoia and hallucinations, most recently at his ICU stay. He takes monthly Haldol  injections, next one not due until 03/09/2024, which is a better option for him. At his previous hospital admission he required a sitter due to paranoia. Unsure if substance abuse is playing a role here as well. From what I can tell he did not follow up with [facility].   Plan:  - IM Haldol  PRN for agitation   Diet: NPO VTE: Enoxaparin  IVF: LR,100cc/hr Code: Full  Prior to Admission Living Arrangement: Homeless Anticipated Discharge Location: Home Barriers to Discharge: Medical management  Dispo: Admit patient to Inpatient  with expected length of stay greater than 2 midnights.  Signed: Shereda Graw, MD Internal Medicine Resident PGY-2  02/24/2024, 4:16 PM

## 2024-02-24 NOTE — ED Provider Triage Note (Signed)
 Emergency Medicine Provider Triage Evaluation Note  Gurtej Kue , a 33 y.o. male  was evaluated in triage.  Pt complains of not feeling well.  He is type I diabetic.  Was just discharged for admission for DKA April 19 through April 21.  Was in the ICU.  Followed by internal medicine residency service.  Patient states he has not been compliant with his insulin .  But he did take long-acting insulin  this morning at 30 units.  Says he feels bad all over.  Denies any abdominal pain.  Denies any nausea vomiting.  Review of Systems  Positive:  Negative: Nausea vomiting abdominal pain  Physical Exam  BP (!) 145/111 (BP Location: Left Arm)   Pulse (!) 113   Temp 98.7 F (37.1 C)   Resp 16   SpO2 100%  Gen: Not diaphoretic tachycardic Resp: Normal effort lungs clear to auscultation Abdomen: Flat soft nontender Heart: Tachycardic MSK: Moves extremities without difficulty  Other: Mucous membranes very dry.  Medical Decision Making  Medically screening exam initiated at 11:33 AM.  Appropriate orders placed.  Zayin Matty was informed that the remainder of the evaluation will be completed by another provider, this initial triage assessment does not replace that evaluation, and the importance of remaining in the ED until their evaluation is complete.  Patient venous blood gas has pH of 1.62.  Patient's basic metabolic i-STAT had sodium 128.  Potassium 5.6.  CO2 was 15.  Creatinine 1.3.  Sugar was greater than 700.  Patient is in DKA.  Will start the hyperglycemic protocol.  Patient will require admission.  Patient will be moved to a room.     Coral Soler, MD 02/24/24 1147

## 2024-02-24 NOTE — Discharge Summary (Signed)
   Name: Jacob [name] MRN: [id number] DOB: [birth year] 33 y.o. PCP: Jayson Michael, MD  Date of Admission: 02/24/2024 11:02 AM Date of Discharge: No discharge date for patient encounter. Attending Physician: Cherylene Corrente, MD  Discharge Diagnosis: 1. Active Problems:   DKA (diabetic ketoacidosis) (HCC)   PATIENT LEFT EMERGENCY DEPARTMENT AGAINST MEDICAL ADVICE   Disposition and follow-up:   [name] was discharged from [facility] AGAINST MEDICAL ADVICE At the hospital follow up visit please address:   Follow-up Appointments:   Hospital Course by problem list: Pt presented to the emergency department and found to have glucose >600, acidotic at 7.162, and an elevated Beta-hydroxybutryic acid at 7.16, supportive for diabetic ketoacidosis. He was previously admitted from 4/19-4/21 with DKA that required ICU stay. On exam today he is lethargic, and difficult to say if he has been adherent to his insulin  regimen of Lantus  30U and Novolog  7U TID on discharge last time. Unfortunately, he has multiple negative social determinants of health such as affordability of medications and currently being unhoused. His endotool was transitioned off, and he was given a meal with 30U of Lantus . After coming back to his regular mental state, pt left against medical advice. By the time I was able to go down to the emergency department to discuss this with him, he had already left.      Signed: Arielis Leonhart, MD 02/24/2024, 7:03 PM   Pager: [phone]

## 2024-02-24 NOTE — ED Notes (Signed)
 Pt would like a Child psychotherapist to assist with housing and a glucometer.

## 2024-02-24 NOTE — Progress Notes (Deleted)
 Patient presents for hospital follow up today. He was admitted 04/19-04/21 with diabetic ketoacidosis and actually initially required ICU admission for management. He has had multiple presentations for hyperglycemia and over the last several months due to medication non-adherence as an outpatient. He has several psychosocial factors complicating his care outside of the hospital including being unhoused, recent diagnosis of schizoaffective disorder, losing his diabetes supplies almost as soon as he is given them as a result of these barriers. At discharge he received 30 days of insulin --his regimen is 30 units of *** daily and 7 units of *** TID with meals. He has unfortunately lost his Dexcom several times and insurance will not approve it***.   Plan:  Schizoaffective disorder Patient has*** been able to schedule a follow up appointment with behavioral health. Plan:

## 2024-02-24 NOTE — ED Provider Notes (Addendum)
 [place] EMERGENCY DEPARTMENT AT [facility] Provider Note   CSN: [id number] Arrival date & time: 02/24/24  1057     History  Chief Complaint  Patient presents with   Hyperglycemia    Jacob [name] is a 33 y.o. male.   Hyperglycemia Associated symptoms: confusion   Patient is a 33 year old male who presents to the ED today with "not feeling well" and lethargy.  Previous medical history of uncontrolled type 1 diabetes, schizoaffective disorder, GAD, tobacco dependence, homelessness, psychosis, MDD.  Patient lethargic and alert and oriented x 2 to person and place, slow to respond but answering questions about symptoms but unable to provide appropriate answers to time and events.  States that he had taken his 30 units of basal insulin  today.  Patient already on LR  with elevated blood glucose of 868.  Was originally supposed to meet with [facility] clinic today after being recently discharged from the hospital on 02/20/2024.  Denies fever, headache, chest pain, shortness of breath, abdominal pain, nausea, vomiting.     Home Medications Prior to Admission medications   Medication Sig Start Date End Date Taking? Authorizing Provider  benztropine  (COGENTIN ) 0.5 MG tablet Take 1 tablet (0.5 mg total) by mouth 2 (two) times daily. For prevention of EPS Patient not taking: Reported on 02/19/2024 02/09/24   Asuncion Layer I, NP  blood glucose meter kit and supplies KIT Dispense based on patient and insurance preference. Use up to four times daily as directed. 02/20/24   Alexander-Savino, [place], MD  haloperidol  decanoate (HALDOL  DECANOATE) 100 MG/ML injection Inject 1 mL (100 mg total) into the muscle every 30 (thirty) days. (Due on 03-09-24): For mood control Patient not taking: Reported on 02/19/2024 03/09/24   Asuncion Layer I, NP  insulin  aspart (NOVOLOG  FLEXPEN) 100 UNIT/ML FlexPen Inject 7 Units into the skin 3 (three) times daily before meals. For diabetes control 02/20/24    Isabell Manzanilla, [place], MD  insulin  glargine (LANTUS  SOLOSTAR) 100 UNIT/ML Solostar Pen Inject 30 Units into the skin daily. For diabetes control. 02/20/24   Alexander-Savino, [place], MD  Insulin  Pen Needle (BD PEN NEEDLE NANO 2ND GEN) 32G X 4 MM MISC Use to inject insulin  up to 4 times a day 02/20/24   Alexander-Savino, [place], MD  traZODone  (DESYREL ) 50 MG tablet Take 1 tablet (50 mg total) by mouth at bedtime. For sleep Patient not taking: Reported on 02/19/2024 02/09/24   Asuncion Layer I, NP  Vitamin D , Ergocalciferol , (DRISDOL ) 1.25 MG (50000 UNIT) CAPS capsule Take 1 capsule (50,000 Units total) by mouth every 7 (seven) days. Patient not taking: Reported on 02/19/2024 02/13/24   Asuncion Layer I, NP      Allergies    Patient has no known allergies.    Review of Systems   Review of Systems  Psychiatric/Behavioral:  Positive for confusion.   All other systems reviewed and are negative.   Physical Exam Updated Vital Signs BP 121/71 (BP Location: Right Arm)   Pulse 97   Temp 98.6 F (37 C) (Oral)   Resp 16   SpO2 100%  Physical Exam Vitals and nursing note reviewed.  Constitutional:      Appearance: He is ill-appearing.  HENT:     Head: Normocephalic and atraumatic.  Eyes:     General: No scleral icterus.       Right eye: No discharge.        Left eye: No discharge.     Conjunctiva/sclera: Conjunctivae normal.     Pupils: Pupils  are equal, round, and reactive to light.     Comments: Unable to evaluate EOM as patient is lethargic and unable to follow commands.   Cardiovascular:     Rate and Rhythm: Regular rhythm. Tachycardia present.     Pulses: Normal pulses.     Heart sounds: Normal heart sounds. No murmur heard. Pulmonary:     Effort: Pulmonary effort is normal. No respiratory distress.     Breath sounds: Normal breath sounds. No stridor. No wheezing, rhonchi or rales.  Abdominal:     General: Abdomen is flat.     Palpations: Abdomen is soft.     Tenderness:  There is no abdominal tenderness.  Musculoskeletal:     Right lower leg: No edema.     Left lower leg: No edema.  Skin:    General: Skin is warm and dry.  Neurological:     Mental Status: He is lethargic, disoriented and confused.     GCS: GCS eye subscore is 3. GCS verbal subscore is 3. GCS motor subscore is 4.     Comments: Unable to perform neuroexam as patient is unable to follow commands.  Alert and oriented x 2 to person and place.  Answering questions about symptoms but providing inappropriate responses to questions of time and event.  Psychiatric:        Mood and Affect: Mood normal.     ED Results / Procedures / Treatments   Labs (all labs ordered are listed, but only abnormal results are displayed) Labs Reviewed  CBC - Abnormal; Notable for the following components:      Result Value   WBC 18.3 (*)    Platelets 408 (*)    All other components within normal limits  URINALYSIS, ROUTINE W REFLEX MICROSCOPIC - Abnormal; Notable for the following components:   Color, Urine STRAW (*)    Glucose, UA >=500 (*)    Ketones, ur 80 (*)    All other components within normal limits  COMPREHENSIVE METABOLIC PANEL WITH GFR - Abnormal; Notable for the following components:   Sodium 129 (*)    Potassium 6.1 (*)    Chloride 85 (*)    CO2 12 (*)    Glucose, Bld 868 (*)    BUN 32 (*)    Creatinine, Ser 2.15 (*)    Total Protein 8.7 (*)    AST 58 (*)    ALT 73 (*)    Total Bilirubin 2.3 (*)    GFR, Estimated 41 (*)    Anion gap 32 (*)    All other components within normal limits  BASIC METABOLIC PANEL WITH GFR - Abnormal; Notable for the following components:   Sodium 131 (*)    Potassium 5.4 (*)    Chloride 91 (*)    CO2 18 (*)    Glucose, Bld 469 (*)    BUN 27 (*)    Creatinine, Ser 1.62 (*)    GFR, Estimated 57 (*)    Anion gap 22 (*)    All other components within normal limits  BETA-HYDROXYBUTYRIC ACID - Abnormal; Notable for the following components:    Beta-Hydroxybutyric Acid 7.16 (*)    All other components within normal limits  CBG MONITORING, ED - Abnormal; Notable for the following components:   Glucose-Capillary >600 (*)    All other components within normal limits  CBG MONITORING, ED - Abnormal; Notable for the following components:   Glucose-Capillary 303 (*)    All other components within normal limits  I-STAT VENOUS BLOOD GAS, ED - Abnormal; Notable for the following components:   pH, Ven 7.162 (*)    pCO2, Ven 39.4 (*)    Bicarbonate 14.1 (*)    TCO2 15 (*)    Acid-base deficit 14.0 (*)    Sodium 126 (*)    Potassium 5.6 (*)    All other components within normal limits  CBG MONITORING, ED - Abnormal; Notable for the following components:   Glucose-Capillary 203 (*)    All other components within normal limits  I-STAT VENOUS BLOOD GAS, ED - Abnormal; Notable for the following components:   pCO2, Ven 39.9 (*)    pO2, Ven 131 (*)    TCO2 21 (*)    Acid-base deficit 6.0 (*)    Sodium 129 (*)    All other components within normal limits  I-STAT CHEM 8, ED - Abnormal; Notable for the following components:   Sodium 128 (*)    Potassium 5.6 (*)    Chloride 94 (*)    BUN 33 (*)    Creatinine, Ser 1.30 (*)    Glucose, Bld >700 (*)    TCO2 15 (*)    All other components within normal limits  I-STAT CHEM 8, ED - Abnormal; Notable for the following components:   Sodium 131 (*)    Chloride 95 (*)    BUN 30 (*)    Glucose, Bld 509 (*)    TCO2 20 (*)    All other components within normal limits  RAPID URINE DRUG SCREEN, HOSP PERFORMED  BASIC METABOLIC PANEL WITH GFR  BASIC METABOLIC PANEL WITH GFR  BASIC METABOLIC PANEL WITH GFR  BETA-HYDROXYBUTYRIC ACID  BETA-HYDROXYBUTYRIC ACID  BETA-HYDROXYBUTYRIC ACID  MAGNESIUM   PHOSPHORUS  LIPASE, BLOOD  CBG MONITORING, ED    EKG EKG Interpretation Date/Time:  Friday February 24 2024 11:53:40 EDT Ventricular Rate:  106 PR Interval:    QRS Duration:  80 QT  Interval:  320 QTC Calculation: 425 R Axis:   99  Text Interpretation: Sinus tachycardia T wave abnormality Artifact No significant change since last tracing Abnormal ECG Confirmed by Dorenda Gandy [phone]) on 02/24/2024 12:57:15 PM  Radiology No results found.  Procedures .Critical Care  Performed by: Hayes Lipps, [name] Authorized by: Hayes Lipps, [name]   Critical care provider statement:    Critical care time (minutes):  45   Critical care time was exclusive of:  Separately billable procedures and treating other patients   Critical care was necessary to treat or prevent imminent or life-threatening deterioration of the following conditions:  Endocrine crisis and metabolic crisis   Critical care was time spent personally by me on the following activities:  Blood draw for specimens, development of treatment plan with patient or surrogate, discussions with consultants, evaluation of patient's response to treatment, examination of patient, obtaining history from patient or surrogate, ordering and performing treatments and interventions, ordering and review of laboratory studies, ordering and review of radiographic studies, pulse oximetry, re-evaluation of patient's condition and review of old charts   I assumed direction of critical care for this patient from another provider in my specialty: yes     Care discussed with: admitting provider      Medications Ordered in ED Medications  insulin  regular, human (MYXREDLIN ) 100 units/ 100 mL infusion (4.2 Units/hr Intravenous Infusion Verify 02/24/24 1513)  lactated ringers  infusion (0 mLs Intravenous Stopped 02/24/24 1504)  dextrose  5 % in lactated ringers  infusion ( Intravenous New Bag/Given 02/24/24 1510)  dextrose  50 % solution 0-50 mL (has no administration in time range)  lactated ringers  bolus 1,186 mL (0 mLs Intravenous Stopped 02/24/24 1409)  calcium  gluconate inj 10% (1 g) URGENT USE ONLY! (1 g Intravenous Given 02/24/24 1405)     ED Course/ Medical Decision Making/ A&P                                 Medical Decision Making Amount and/or Complexity of Data Reviewed Labs: ordered.  Risk Prescription drug management. Decision regarding hospitalization.   This patient is a 33 year old male who presents to the ED for concern of lethargy, medical noncompliance, malaise.  Presents with GCS of 10, alert and oriented x 2 to person and place but has inappropriate responses to time, responding to questions about symptoms however unreliable due to A&O status.  Mother was reportedly in triage but is not in the room when evaluating patient.  Unable to obtain proper history due to patient mental status.  Noted to have been recently discharged on/20 11/2023 for similar presentation with DKA with encephalopathy, standing 1 night in ICU where he was then supposed to follow-up upon discharge with [facility] clinic on 02/24/2024 but was unable to make it today due to feeling unwell.  States that he took his 30 units of basal insulin .  Patient is noted to be tachycardic, but nontachypneic, afebrile.  Protecting airway.  PERRL.  LCTAB.  RRR with no murmur.  No abdominal tenderness noted to palpation.  No injuries noted to head, chest, upper extremities, lower extremities.  Exam is otherwise unremarkable.  Initial labs showed hyperkalemia with mild T wave elevation on EKG.  Patient was provided insulin  drip, LR, calcium  gluconate for DKA with encephalopathy.  Critical care was called who stated due to him being no longer acidotic after treatment, that he could be admitted to internal medicine and would consult if desired by internal medicine.  Internal medicine was then consulted, and patient care was then assumed by internal medicine to be treated for DKA with encephalopathy.   Differential diagnoses prior to evaluation: The emergent differential diagnosis includes, but is not limited to, DKA, pancreatitis, renal failure, metabolic acidosis,  medication poor compliance. This is not an exhaustive differential.   Past Medical History / Co-morbidities / Social History: Uncontrolled type 1 diabetes, schizoaffective disorder GAD, tobacco dependence, homelessness, psychosis, MDD  Additional history: Chart reviewed. Pertinent results include:   Recently discharged on 02/20/2024 for DKA and was treated with insulin  drip and isotonic fluids in ICU.  Noted to be hyponatremic at that time as well.  Patient was able to tolerate p.o. and was on a home regimen of 30 units basal insulin  as well as 7 units of mealtime insulin  3 times per day.  Noted to have multiple issues affecting his care including homelessness, psychiatric symptoms and consistently losing medical supplies.  Noted to have been scheduled for a close follow-up at [facility] clinic on Friday, 02/24/2024.  Lab Tests/Imaging studies: I personally interpreted labs/imaging and the pertinent results include:    CMP notes an elevated glucose of 868, hyponatremia of 129, hyperkalemia of 6.1, elevated creatinine of 2.15, elevated BUN of 32, elevated AST of 58, elevated ALT of 73, elevated bilirubin of 2.3, decreased GFR of 41 and anion gap of 32.  Repeat BMP noted this mild hyponatremia 131, hyperkalemia 5.4, elevated creatinine 1.62, elevated anion gap of 22, GFR 57, BUN of 27 CBC notes a  leukocytosis of 18.3.  UA notes ketones   Beta hydroxybutyrate acid pending, phosphorus pending, lipase pending, magnesium  pending   Cardiac monitoring: EKG obtained and interpreted by myself and attending physician which shows: Sinus tachycardia  EKG Interpretation Date/Time:  Friday February 24 2024 11:53:40 EDT Ventricular Rate:  106 PR Interval:    QRS Duration:  80 QT Interval:  320 QTC Calculation: 425 R Axis:   99  Text Interpretation: Sinus tachycardia T wave abnormality Artifact No significant change since last tracing Abnormal ECG Confirmed by Dorenda Gandy [phone]) on 02/24/2024 12:57:15 PM           Medications: I ordered medication including insulin  drip, LR, calcium  gluconate, dextrose  infusion.  I have reviewed the patients home medicines and have made adjustments as needed.  Critical Interventions: Treated for DKA and encephalopathy, consulted with intensive care and internal medicine  Social Determinants of Health: Multiple psychiatric symptoms, noncompliance medication, poor follow-up  Disposition: After consideration of the diagnostic results and the patients response to treatment, I feel that the patient would benefit from admission, patient care being assumed by internal medicine, Dr. Kirt Pereyra at this time.   Final Clinical Impression(s) / ED Diagnoses Final diagnoses:  Diabetic ketoacidosis without coma associated with type 1 diabetes mellitus [facility])    Rx / DC Orders ED Discharge Orders     None         Hayes Lipps, [name] 02/24/24 1527    Hayes Lipps, [place] 02/24/24 1545    Dorenda Gandy, MD 02/27/24 [phone]

## 2024-02-24 NOTE — ED Triage Notes (Signed)
 Mother stated, He does not do what he is suppose to do. He is homeless cause he doesn't do what he does. He sometimes stay with his brother , mother. He doesn't work due to mental issues. Pt. Stated, I took my long acting insulin  this morning 30u. Pt stated, I just feel bad.

## 2024-02-24 NOTE — ED Notes (Signed)
 Pt had a bowel movement on the floor and stated that he wants to leave. Pt removed both of his Ivs and state dthat he wants to leave

## 2024-02-24 NOTE — Inpatient Diabetes Management (Addendum)
 Inpatient Diabetes Program Recommendations  AACE/ADA: New Consensus Statement on Inpatient Glycemic Control (2015)  Target Ranges:  Prepandial:   less than 140 mg/dL      Peak postprandial:   less than 180 mg/dL (1-2 hours)      Critically ill patients:  140 - 180 mg/dL   Lab Results  Component Value Date   GLUCAP >600 (HH) 02/24/2024   HGBA1C 14.5 (H) 02/07/2024    Review of Glycemic Control  Latest Reference Range & Units 02/24/24 11:08  Glucose-Capillary 70 - 99 mg/dL >401 (HH)   Diabetes history: Type 1 DM  Outpatient Diabetes medications:  Lantus  30 units daily, Novolog  7 units tid with meals  Current orders for Inpatient glycemic control:  IV insulin /DKA orders  Inpatient Diabetes Program Recommendations:    Agree with current orders.  Patient was recently d/c'd from the hospital on 02/20/24.  He is working closely outpatient with CDE on management and has Dexcom sensor and reader.   Per mother "Mother stated, He does not do what he is suppose to do. He is homeless". Patient stated that he took his long acting insulin  but doesn't feel good.    NOTE: Noted consult for Diabetes Coordinator. Chart reviewed. Patient is known to Inpatient Diabetes team due to multiple hospital admissions and ED visits. Patient was most recently inpatient at [facility] 02/02/24-02/09/24 and inpatient hospital from 4/19-4/21/25.   Thanks,  Josefa Ni, RN, BC-ADM Inpatient Diabetes Coordinator Pager [phone]  (8a-5p)

## 2024-02-24 NOTE — ED Notes (Signed)
 MD notified of pts urgency to AMA. When MD rounded on pt in the ER pt was not in room. Both IVS were in the bed and pts belongings were not in the room. MD aware and pt placement called to notify

## 2024-03-01 ENCOUNTER — Encounter (HOSPITAL_COMMUNITY): Payer: Self-pay

## 2024-03-01 ENCOUNTER — Other Ambulatory Visit: Payer: Self-pay

## 2024-03-01 ENCOUNTER — Inpatient Hospital Stay (HOSPITAL_COMMUNITY)
Admission: EM | Admit: 2024-03-01 | Discharge: 2024-03-03 | DRG: 638 | Disposition: A | Discharge status: Home / Self Care | Payer: MEDICAID | Attending: Internal Medicine | Admitting: Internal Medicine

## 2024-03-01 DIAGNOSIS — N179 Acute kidney failure, unspecified: Secondary | ICD-10-CM | POA: Diagnosis present

## 2024-03-01 DIAGNOSIS — D72829 Elevated white blood cell count, unspecified: Secondary | ICD-10-CM | POA: Diagnosis present

## 2024-03-01 DIAGNOSIS — F259 Schizoaffective disorder, unspecified: Secondary | ICD-10-CM | POA: Diagnosis present

## 2024-03-01 DIAGNOSIS — E1010 Type 1 diabetes mellitus with ketoacidosis without coma: Secondary | ICD-10-CM | POA: Diagnosis present

## 2024-03-01 DIAGNOSIS — F250 Schizoaffective disorder, bipolar type: Secondary | ICD-10-CM | POA: Diagnosis present

## 2024-03-01 DIAGNOSIS — E860 Dehydration: Secondary | ICD-10-CM

## 2024-03-01 DIAGNOSIS — D75839 Thrombocytosis, unspecified: Secondary | ICD-10-CM | POA: Diagnosis present

## 2024-03-01 DIAGNOSIS — E1110 Type 2 diabetes mellitus with ketoacidosis without coma: Secondary | ICD-10-CM | POA: Diagnosis present

## 2024-03-01 DIAGNOSIS — F209 Schizophrenia, unspecified: Secondary | ICD-10-CM | POA: Diagnosis not present

## 2024-03-01 DIAGNOSIS — R739 Hyperglycemia, unspecified: Principal | ICD-10-CM

## 2024-03-01 LAB — URINALYSIS, ROUTINE W REFLEX MICROSCOPIC
Bilirubin Urine: NEGATIVE
Glucose, UA: >=500 mg/dL — AB
Hgb urine dipstick: NEGATIVE
Ketones, ur: 20 mg/dL — AB
Leukocytes,Ua: NEGATIVE
Nitrite: NEGATIVE
Protein, ur: 100 mg/dL — AB
Specific Gravity, Urine: 1.023 (ref 1.005–1.030)
pH: 5 (ref 5.0–8.0)

## 2024-03-01 LAB — COMPREHENSIVE METABOLIC PANEL WITH GFR
ALT: 237 U/L — ABNORMAL HIGH (ref 0–44)
AST: 167 U/L — ABNORMAL HIGH (ref 15–41)
Albumin: 5 g/dL (ref 3.5–5.0)
Alkaline Phosphatase: 113 U/L (ref 38–126)
Anion gap: 21 — ABNORMAL HIGH (ref 5–15)
BUN: 30 mg/dL — ABNORMAL HIGH (ref 6–20)
CO2: 18 mmol/L — ABNORMAL LOW (ref 22–32)
Calcium: 11 mg/dL — ABNORMAL HIGH (ref 8.9–10.3)
Chloride: 99 mmol/L (ref 98–111)
Creatinine, Ser: 1.97 mg/dL — ABNORMAL HIGH (ref 0.61–1.24)
GFR, Estimated: 45 mL/min — ABNORMAL LOW (ref >60)
Glucose, Bld: 374 mg/dL — ABNORMAL HIGH (ref 70–99)
Potassium: 5.1 mmol/L (ref 3.5–5.1)
Sodium: 138 mmol/L (ref 135–145)
Total Bilirubin: 1.4 mg/dL — ABNORMAL HIGH (ref 0.0–1.2)
Total Protein: 8.8 g/dL — ABNORMAL HIGH (ref 6.5–8.1)

## 2024-03-01 LAB — CBG MONITORING, ED
Glucose-Capillary: 181 mg/dL — ABNORMAL HIGH (ref 70–99)
Glucose-Capillary: 182 mg/dL — ABNORMAL HIGH (ref 70–99)
Glucose-Capillary: 189 mg/dL — ABNORMAL HIGH (ref 70–99)
Glucose-Capillary: 202 mg/dL — ABNORMAL HIGH (ref 70–99)
Glucose-Capillary: 236 mg/dL — ABNORMAL HIGH (ref 70–99)
Glucose-Capillary: 250 mg/dL — ABNORMAL HIGH (ref 70–99)
Glucose-Capillary: 280 mg/dL — ABNORMAL HIGH (ref 70–99)
Glucose-Capillary: 375 mg/dL — ABNORMAL HIGH (ref 70–99)
Glucose-Capillary: 423 mg/dL — ABNORMAL HIGH (ref 70–99)
Glucose-Capillary: 480 mg/dL — ABNORMAL HIGH (ref 70–99)

## 2024-03-01 LAB — BASIC METABOLIC PANEL WITH GFR
Anion gap: 15 (ref 5–15)
BUN: 22 mg/dL — ABNORMAL HIGH (ref 6–20)
CO2: 25 mmol/L (ref 22–32)
Calcium: 10.1 mg/dL (ref 8.9–10.3)
Chloride: 98 mmol/L (ref 98–111)
Creatinine, Ser: 1.3 mg/dL — ABNORMAL HIGH (ref 0.61–1.24)
GFR, Estimated: >60 mL/min (ref >60)
Glucose, Bld: 265 mg/dL — ABNORMAL HIGH (ref 70–99)
Potassium: 4.7 mmol/L (ref 3.5–5.1)
Sodium: 138 mmol/L (ref 135–145)

## 2024-03-01 LAB — RAPID URINE DRUG SCREEN, HOSP PERFORMED
Amphetamines: NOT DETECTED
Barbiturates: NOT DETECTED
Benzodiazepines: NOT DETECTED
Cocaine: NOT DETECTED
Opiates: NOT DETECTED
Tetrahydrocannabinol: POSITIVE — AB

## 2024-03-01 LAB — I-STAT VENOUS BLOOD GAS, ED
Acid-base deficit: 2 mmol/L (ref 0.0–2.0)
Bicarbonate: 21.7 mmol/L (ref 20.0–28.0)
Calcium, Ion: 1.21 mmol/L (ref 1.15–1.40)
HCT: 46 % (ref 39.0–52.0)
Hemoglobin: 15.6 g/dL (ref 13.0–17.0)
O2 Saturation: 93 %
Potassium: 4.9 mmol/L (ref 3.5–5.1)
Sodium: 135 mmol/L (ref 135–145)
TCO2: 23 mmol/L (ref 22–32)
pCO2, Ven: 33.8 mmHg — ABNORMAL LOW (ref 44–60)
pH, Ven: 7.416 (ref 7.25–7.43)
pO2, Ven: 65 mmHg — ABNORMAL HIGH (ref 32–45)

## 2024-03-01 LAB — CBC WITH DIFFERENTIAL/PLATELET
Abs Immature Granulocytes: 0.26 10*3/uL — ABNORMAL HIGH (ref 0.00–0.07)
Basophils Absolute: 0.1 10*3/uL (ref 0.0–0.1)
Basophils Relative: 0 %
Eosinophils Absolute: 0 10*3/uL (ref 0.0–0.5)
Eosinophils Relative: 0 %
HCT: 44.5 % (ref 39.0–52.0)
Hemoglobin: 15.7 g/dL (ref 13.0–17.0)
Immature Granulocytes: 1 %
Lymphocytes Relative: 8 %
Lymphs Abs: 1.9 10*3/uL (ref 0.7–4.0)
MCH: 29.6 pg (ref 26.0–34.0)
MCHC: 35.3 g/dL (ref 30.0–36.0)
MCV: 83.8 fL (ref 80.0–100.0)
Monocytes Absolute: 1.9 10*3/uL — ABNORMAL HIGH (ref 0.1–1.0)
Monocytes Relative: 8 %
Neutro Abs: 19.3 10*3/uL — ABNORMAL HIGH (ref 1.7–7.7)
Neutrophils Relative %: 83 %
Platelets: 620 10*3/uL — ABNORMAL HIGH (ref 150–400)
RBC: 5.31 MIL/uL (ref 4.22–5.81)
RDW: 12.7 % (ref 11.5–15.5)
WBC: 23.4 10*3/uL — ABNORMAL HIGH (ref 4.0–10.5)
nRBC: 0 % (ref 0.0–0.2)

## 2024-03-01 LAB — BETA-HYDROXYBUTYRIC ACID: Beta-Hydroxybutyric Acid: 3.82 mmol/L — ABNORMAL HIGH (ref 0.05–0.27)

## 2024-03-01 LAB — ETHANOL: Alcohol, Ethyl (B): <15 mg/dL (ref <15)

## 2024-03-01 LAB — MAGNESIUM: Magnesium: 3 mg/dL — ABNORMAL HIGH (ref 1.7–2.4)

## 2024-03-01 MED ORDER — DEXTROSE IN LACTATED RINGERS 5 % IV SOLN: INTRAVENOUS | Status: AC

## 2024-03-01 MED ORDER — ONDANSETRON HCL 4 MG/2ML IJ SOLN: 4.0000 mg | Freq: Once | INTRAMUSCULAR | Status: AC

## 2024-03-01 MED ORDER — LACTATED RINGERS IV SOLN
INTRAVENOUS | Status: AC
Start: 2024-03-01 — End: 2024-03-02

## 2024-03-01 MED ORDER — HALOPERIDOL LACTATE 5 MG/ML IJ SOLN: 1.0000 mg | Freq: Four times a day (QID) | INTRAMUSCULAR | Status: DC | PRN

## 2024-03-01 MED ORDER — POTASSIUM CHLORIDE 10 MEQ/100ML IV SOLN
10.0000 meq | INTRAVENOUS | Status: AC
  Administered 2024-03-01 (×2): 10 meq via INTRAVENOUS
  Filled 2024-03-01: qty 100

## 2024-03-01 MED ORDER — LACTATED RINGERS IV BOLUS
2000.0000 mL | Freq: Once | INTRAVENOUS | Status: AC
  Administered 2024-03-01: 2000 mL via INTRAVENOUS

## 2024-03-01 MED ORDER — ONDANSETRON HCL 4 MG/2ML IJ SOLN
INTRAMUSCULAR | Status: AC
  Administered 2024-03-01: 4 mg via INTRAVENOUS
  Filled 2024-03-01: qty 2

## 2024-03-01 MED ORDER — INSULIN REGULAR(HUMAN) IN NACL 100-0.9 UT/100ML-% IV SOLN
INTRAVENOUS | Status: DC
  Administered 2024-03-01: 6 [IU]/h via INTRAVENOUS
  Filled 2024-03-01: qty 100

## 2024-03-01 MED ORDER — DEXTROSE 50 % IV SOLN: 0.0000 mL | INTRAVENOUS | Status: DC | PRN

## 2024-03-01 NOTE — ED Triage Notes (Signed)
 Pt states his blood sugar is high; states he has been feeling poorly today; endorses emesis, urinary frequency; denies fever, abd pain, rashes; seen for same 4/25, left AMA  Verbal consent given for MSE

## 2024-03-01 NOTE — H&P (Signed)
 Date: 03/01/2024               Patient Name:  Jacob [name] MRN: [id number]  DOB: 20-May-1991 Age / Sex: 33 y.o., male   PCP: Jayson Michael, MD         Medical Service: Internal Medicine Teaching Service         Attending Physician: Dr. Cherylene Corrente, MD      First Contact: Dr. Jose Ngo    Second Contact: Dr. Rayann Atway         After Hours (After 5p/  First Contact Pager: [phone]  weekends / holidays): Second Contact Pager: [phone]   SUBJECTIVE   Chief Complaint: Nausea, vomiting  History of Present Illness:   Mr. Caelin Joerger is a 33 year old male with past medical history of type 1 diabetes melitis, and schizoaffective disorder who presented to the emergency department from home.  History mostly taken from mother, and she states that he is currently living with "her other son". He had a Child psychotherapist check in on him today because they are trying to find good, stable housing for him, and Child psychotherapist noticed that he was not feeling well, and called mom and told her that he should go to the hospital. Today, he is lethargic, but able to answer some questions. He states that he has been having nausea and vomiting, but doesn't know if he has actually vomited. He states he is adherent to his insulin . He denies any chest pain, or shortness of breath.   Of note, patient has had 1 full admission for DKA earlier in April, was admitted again on April 25, however left AMA.  ED Course: Endo tool started  Meds:  Lantus  30U Daily  Novolog  7 Units TID w/ meals  Intramuscular haldol  monthly, next dose due on May 9th Trazodone  50mg   Benztropine  .5mg  BID  Past Medical History Type 1 Diabetes Mellitus  Schizoaffective Disorder  Social:  Lives With: Alternated between unhomed and living with mothers other son Occupation: Does not work  Support: Mother in the area Level of Function: Independent in all ADLs/IADLs when no acutely ill  PCP: Dr. Jayson Michael,  MD Substances: Per chart review, occasional tobacco use  Family History: Unable to obtain  Allergies: Allergies as of 03/01/2024   (No Known Allergies)    Review of Systems: A complete ROS was negative except as per HPI.   OBJECTIVE:   Physical Exam: Blood pressure (!) 117/93, pulse (!) 129, temperature 98 F (36.7 C), resp. rate 18, SpO2 100%.  Constitutional: Lethargic, tired male, in no acute distress HENT: normocephalic atraumatic, mucous membranes moist Eyes: conjunctiva non-erythematous Neck: supple Cardiovascular: tachycardic and rhythm, no m/r/g Pulmonary/Chest: normal work of breathing on room air, lungs clear to auscultation bilaterally Abdominal: soft, non-tender, non-distended MSK: normal bulk and tone Neurological: alert & oriented x 3, 5/5 strength in bilateral upper and lower extremities, normal gait Skin: warm and dry Psych: normal mood and affect  Labs: CBC    Component Value Date/Time   WBC 23.4 (H) 03/01/2024 1259   RBC 5.31 03/01/2024 1259   HGB 15.6 03/01/2024 1308   HCT 46.0 03/01/2024 1308   PLT 620 (H) 03/01/2024 1259   MCV 83.8 03/01/2024 1259   MCH 29.6 03/01/2024 1259   MCHC 35.3 03/01/2024 1259   RDW 12.7 03/01/2024 1259   LYMPHSABS 1.9 03/01/2024 1259   MONOABS 1.9 (H) 03/01/2024 1259   EOSABS 0.0 03/01/2024 1259   BASOSABS 0.1 03/01/2024 1259  CMP     Component Value Date/Time   NA 135 03/01/2024 1308   NA 131 (L) 11/06/2018 1736   K 4.9 03/01/2024 1308   CL 99 03/01/2024 1259   CO2 18 (L) 03/01/2024 1259   GLUCOSE 374 (H) 03/01/2024 1259   BUN 30 (H) 03/01/2024 1259   BUN 17 11/06/2018 1736   CREATININE 1.97 (H) 03/01/2024 1259   CALCIUM  11.0 (H) 03/01/2024 1259   PROT 8.8 (H) 03/01/2024 1259   ALBUMIN 5.0 03/01/2024 1259   AST 167 (H) 03/01/2024 1259   ALT 237 (H) 03/01/2024 1259   ALKPHOS 113 03/01/2024 1259   BILITOT 1.4 (H) 03/01/2024 1259   GFRNONAA 45 (L) 03/01/2024 1259   GFRAA >60 07/28/2020 2100     Imaging: None performed  EKG: personally reviewed my interpretation is sinus tachycardia, with artifact/PACs  ASSESSMENT & PLAN:   Assessment & Plan by Problem: Active Problems:   DKA (diabetic ketoacidosis) (HCC)   Taiga Marchant is a 33 y.o. person living with a history of Type 1 diabetes who presented altered and admitted for diabetic ketoacidosis on hospital day 0   #Diabetic Ketoacidosis #Type 1 Diabetes Mellitus Patient with increased nausea, vomiting and intermittent compliance with his insulin  especially in the history of type 1 diabetes makes me concerned for diabetic ketoacidosis.  He has elevated beta hydroxybutyric acid, UA still pending.  Interestingly, not acidotic glucose would not be as high as it would be in an HHS picture, currently at 374.  May be hyperglycemic crisis, rather than DKA, or he has just been in a DKA state for quite some time and is now well compensated.  Bicarb is low at 18, and he does have an anion gap as well.  Regardless, we will treat as DKA, and continue Endo tool.  Plan: - Continue Endo tool -UDS screen - Start every 4 hours BMP checks - Once anion gap is closed x 2, transition off Endo tool and start 30 units long-acting when patient is able to eat  #AKI Creatinine elevated to 1.97, likely in the setting of acute illness with hyperglycemic crisis or diabetic ketoacidosis.  Currently on the Endo tool, with fluids.  Will monitor closely.  Suspecting prerenal cause.  #Leukocytosis #Thrombocytosis Interestingly, patient has a white blood cell count 23.4, he had a white blood cell count of 18.3 at his last admission when he left AMA.  Could be reactive in the setting of DKA, and he denies any infectious symptoms such as chest pain, shortness of breath, diarrhea, or skin ulcers.  He has also been afebrile.  Will continue to monitor.  #Schizoaffective disorder Has history of schizoaffective disorder, which is being treated with monthly injections  of haloperidol , will place Haldol  order as needed for acute agitation, as this happened at his previous hospitalization.  Diet: NPO VTE: Enoxaparin  IVF: LR,100cc/hr Code: Full  Prior to Admission Living Arrangement: Home, living with mom's other son Anticipated Discharge Location: Home Barriers to Discharge: Medical management  Dispo: Admit patient to Inpatient with expected length of stay greater than 2 midnights.  Signed: Athalia Setterlund, MD Internal Medicine Resident PGY-2  03/01/2024, 4:15 PM

## 2024-03-01 NOTE — ED Notes (Signed)
 Called CCMD to add pt to cardiac monitoring

## 2024-03-01 NOTE — ED Provider Notes (Signed)
 [place] EMERGENCY DEPARTMENT AT [facility] Provider Note   CSN: [id number] Arrival date & time: 03/01/24  1251     History  Chief Complaint  Patient presents with   Hyperglycemia    Jacob [name] is a 33 y.o. male.   Hyperglycemia    Patient has a history of bipolar disorder diabetes schizoaffective disorder.  Patient was recently admitted to the hospital for DKA.  Patient returned to the hospital on April 25 for recurrent DKA and he was being admitted.  Patient ended up leaving AMA for completing his care.  Patient states he felt fine for a couple of days.  He started having recurrent nausea and vomiting recently.  His sugars have been elevated.  He denies any abdominal pain.  No fevers.  Home Medications Prior to Admission medications   Medication Sig Start Date End Date Taking? Authorizing Provider  benztropine  (COGENTIN ) 0.5 MG tablet Take 1 tablet (0.5 mg total) by mouth 2 (two) times daily. For prevention of EPS Patient not taking: Reported on 02/19/2024 02/09/24   Asuncion Layer I, NP  blood glucose meter kit and supplies KIT Dispense based on patient and insurance preference. Use up to four times daily as directed. 02/20/24   Alexander-Savino, [place], MD  haloperidol  decanoate (HALDOL  DECANOATE) 100 MG/ML injection Inject 1 mL (100 mg total) into the muscle every 30 (thirty) days. (Due on 03-09-24): For mood control Patient not taking: Reported on 02/19/2024 03/09/24   Asuncion Layer I, NP  insulin  aspart (NOVOLOG  FLEXPEN) 100 UNIT/ML FlexPen Inject 7 Units into the skin 3 (three) times daily before meals. For diabetes control Patient not taking: Reported on 02/24/2024 02/20/24   Alexander-Savino, [place], MD  insulin  glargine (LANTUS  SOLOSTAR) 100 UNIT/ML Solostar Pen Inject 30 Units into the skin daily. For diabetes control. Patient not taking: Reported on 02/24/2024 02/20/24   Isabell Manzanilla, [place], MD  Insulin  Pen Needle (BD PEN NEEDLE NANO 2ND GEN) 32G X 4 MM  MISC Use to inject insulin  up to 4 times a day 02/20/24   Alexander-Savino, [place], MD  traZODone  (DESYREL ) 50 MG tablet Take 1 tablet (50 mg total) by mouth at bedtime. For sleep Patient not taking: Reported on 02/19/2024 02/09/24   Asuncion Layer I, NP  Vitamin D , Ergocalciferol , (DRISDOL ) 1.25 MG (50000 UNIT) CAPS capsule Take 1 capsule (50,000 Units total) by mouth every 7 (seven) days. Patient not taking: Reported on 02/19/2024 02/13/24   Asuncion Layer I, NP      Allergies    Patient has no known allergies.    Review of Systems   Review of Systems  Physical Exam Updated Vital Signs BP (!) 117/93 (BP Location: Right Arm)   Pulse (!) 129   Temp 98 F (36.7 C)   Resp 18   SpO2 100%  Physical Exam Vitals and nursing note reviewed.  Constitutional:      Appearance: He is well-developed. He is ill-appearing. He is not diaphoretic.  HENT:     Head: Normocephalic and atraumatic.     Right Ear: External ear normal.     Left Ear: External ear normal.  Eyes:     General: No scleral icterus.       Right eye: No discharge.        Left eye: No discharge.     Conjunctiva/sclera: Conjunctivae normal.     Comments: Eyes sunken  Neck:     Trachea: No tracheal deviation.  Cardiovascular:     Rate and Rhythm: Regular rhythm. Tachycardia  present.  Pulmonary:     Effort: Pulmonary effort is normal. No respiratory distress.     Breath sounds: Normal breath sounds. No stridor. No wheezing or rales.  Abdominal:     General: Bowel sounds are normal. There is no distension.     Palpations: Abdomen is soft.     Tenderness: There is no abdominal tenderness. There is no guarding or rebound.  Musculoskeletal:        General: No tenderness or deformity.     Cervical back: Neck supple.  Skin:    General: Skin is warm and dry.     Findings: No rash.  Neurological:     General: No focal deficit present.     Mental Status: He is alert.     Cranial Nerves: No cranial nerve deficit, dysarthria or  facial asymmetry.     Sensory: No sensory deficit.     Motor: No abnormal muscle tone or seizure activity.     Coordination: Coordination normal.  Psychiatric:        Mood and Affect: Mood normal.     ED Results / Procedures / Treatments   Labs (all labs ordered are listed, but only abnormal results are displayed) Labs Reviewed  COMPREHENSIVE METABOLIC PANEL WITH GFR - Abnormal; Notable for the following components:      Result Value   CO2 18 (*)    Glucose, Bld 374 (*)    BUN 30 (*)    Creatinine, Ser 1.97 (*)    Calcium  11.0 (*)    Total Protein 8.8 (*)    AST 167 (*)    ALT 237 (*)    Total Bilirubin 1.4 (*)    GFR, Estimated 45 (*)    Anion gap 21 (*)    All other components within normal limits  CBC WITH DIFFERENTIAL/PLATELET - Abnormal; Notable for the following components:   WBC 23.4 (*)    Platelets 620 (*)    Neutro Abs 19.3 (*)    Monocytes Absolute 1.9 (*)    Abs Immature Granulocytes 0.26 (*)    All other components within normal limits  MAGNESIUM  - Abnormal; Notable for the following components:   Magnesium  3.0 (*)    All other components within normal limits  I-STAT VENOUS BLOOD GAS, ED - Abnormal; Notable for the following components:   pCO2, Ven 33.8 (*)    pO2, Ven 65 (*)    All other components within normal limits  CBG MONITORING, ED - Abnormal; Notable for the following components:   Glucose-Capillary 375 (*)    All other components within normal limits  ETHANOL  BETA-HYDROXYBUTYRIC ACID  URINALYSIS, ROUTINE W REFLEX MICROSCOPIC    EKG EKG Interpretation Date/Time:  Thursday Mar 01 2024 13:04:12 EDT Ventricular Rate:  128 PR Interval:  126 QRS Duration:  80 QT Interval:  318 QTC Calculation: 464 R Axis:   112  Text Interpretation: Sinus tachycardia with Premature atrial complexes with Abberant conduction Right atrial enlargement Right axis deviation Pulmonary disease pattern Abnormal ECG When compared with ECG of 24-Feb-2024 11:53,  PREVIOUS ECG IS PRESENT Confirmed by Trish Furl [phone]) on 03/01/2024 3:05:39 PM  Radiology No results found.  Procedures .Critical Care  Performed by: Trish Furl, MD Authorized by: Trish Furl, MD   Critical care provider statement:    Critical care time (minutes):  30   Critical care was time spent personally by me on the following activities:  Development of treatment plan with patient or surrogate, discussions with  consultants, evaluation of patient's response to treatment, examination of patient, ordering and review of laboratory studies, ordering and review of radiographic studies, ordering and performing treatments and interventions, pulse oximetry, re-evaluation of patient's condition and review of old charts     Medications Ordered in ED Medications  lactated ringers  bolus 2,000 mL (has no administration in time range)  insulin  regular, human (MYXREDLIN ) 100 units/ 100 mL infusion (has no administration in time range)  lactated ringers  infusion (has no administration in time range)  dextrose  5 % in lactated ringers  infusion (has no administration in time range)  dextrose  50 % solution 0-50 mL (has no administration in time range)  potassium chloride  10 mEq in 100 mL IVPB (has no administration in time range)    ED Course/ Medical Decision Making/ A&P Clinical Course as of 03/01/24 1509  Thu Mar 01, 2024  1500 Comprehensive metabolic panel(!) Metabolic panel showed his increased BUN and creatinine.  Bicarb decreased anion gap increased [JK]  1500 CBC with Differential(!) Leukocytosis noted. [JK]  1508 Case discussed with medical service regarding admission [JK]    Clinical Course User Index [JK] Trish Furl, MD                                 Medical Decision Making Problems Addressed: Dehydration: acute illness or injury that poses a threat to life or bodily functions Diabetic ketoacidosis without coma associated with type 1 diabetes mellitus (HCC): acute illness or  injury that poses a threat to life or bodily functions Hyperglycemia: acute illness or injury that poses a threat to life or bodily functions  Amount and/or Complexity of Data Reviewed Labs:  Decision-making details documented in ED Course.    Patient presents ED with complaints of recurrent nausea and vomiting hyperglycemia.  Patient's laboratory tests are notable for elevated blood glucose as well as BUN and creatinine consistent with dehydration.  Patient also has signs of increased anion gap acidosis.  Initial pH is normal although beta hydroxybutyric acid levels pending.  Overall suspect a component of early DKA.  Will start the patient on IV fluids.  Will consult with the medical service for admission further treatment.       Final Clinical Impression(s) / ED Diagnoses Final diagnoses:  Hyperglycemia  Dehydration  Diabetic ketoacidosis without coma associated with type 1 diabetes mellitus [facility])    Rx / DC Orders ED Discharge Orders     None         Trish Furl, MD 03/01/24 [phone]

## 2024-03-01 NOTE — ED Provider Triage Note (Signed)
 Emergency Medicine Provider Triage Evaluation Note  Jacob [name] , a 33 y.o. male  was evaluated in triage.  Pt complains of high blood sugar, hasn't tested but doesn't feel well today. Recently admitted for DKA, left AMA bc he didn't have access to a toilet. Reports urinary frequency and vomiting (water ). No abdominal pain, no fevers.   Review of Systems  Positive:  Negative:   Physical Exam  There were no vitals taken for this visit. Gen:   Awake, no distress   Resp:  Normal effort  MSK:   Moves extremities without difficulty  Other:  Dry mucous membranes, tachycardic   Medical Decision Making  Medically screening exam initiated at 12:59 PM.  Appropriate orders placed.  Jacob [name] was informed that the remainder of the evaluation will be completed by another provider, this initial triage assessment does not replace that evaluation, and the importance of remaining in the ED until their evaluation is complete.     Darlis Eisenmenger, [name] 03/01/24 1301

## 2024-03-02 ENCOUNTER — Other Ambulatory Visit (HOSPITAL_COMMUNITY): Payer: Self-pay

## 2024-03-02 ENCOUNTER — Encounter (HOSPITAL_COMMUNITY): Payer: Self-pay | Admitting: Infectious Diseases

## 2024-03-02 DIAGNOSIS — E860 Dehydration: Secondary | ICD-10-CM

## 2024-03-02 DIAGNOSIS — F209 Schizophrenia, unspecified: Secondary | ICD-10-CM | POA: Diagnosis not present

## 2024-03-02 DIAGNOSIS — D72829 Elevated white blood cell count, unspecified: Secondary | ICD-10-CM | POA: Diagnosis not present

## 2024-03-02 DIAGNOSIS — R739 Hyperglycemia, unspecified: Principal | ICD-10-CM

## 2024-03-02 DIAGNOSIS — E1010 Type 1 diabetes mellitus with ketoacidosis without coma: Secondary | ICD-10-CM | POA: Diagnosis not present

## 2024-03-02 DIAGNOSIS — N179 Acute kidney failure, unspecified: Secondary | ICD-10-CM

## 2024-03-02 LAB — CBG MONITORING, ED
Glucose-Capillary: 135 mg/dL — ABNORMAL HIGH (ref 70–99)
Glucose-Capillary: 147 mg/dL — ABNORMAL HIGH (ref 70–99)
Glucose-Capillary: 187 mg/dL — ABNORMAL HIGH (ref 70–99)
Glucose-Capillary: 199 mg/dL — ABNORMAL HIGH (ref 70–99)
Glucose-Capillary: 203 mg/dL — ABNORMAL HIGH (ref 70–99)
Glucose-Capillary: 210 mg/dL — ABNORMAL HIGH (ref 70–99)
Glucose-Capillary: 211 mg/dL — ABNORMAL HIGH (ref 70–99)
Glucose-Capillary: 284 mg/dL — ABNORMAL HIGH (ref 70–99)
Glucose-Capillary: 335 mg/dL — ABNORMAL HIGH (ref 70–99)

## 2024-03-02 LAB — CBC WITH DIFFERENTIAL/PLATELET
Abs Immature Granulocytes: 0.04 10*3/uL (ref 0.00–0.07)
Basophils Absolute: 0 10*3/uL (ref 0.0–0.1)
Basophils Relative: 0 %
Eosinophils Absolute: 0 10*3/uL (ref 0.0–0.5)
Eosinophils Relative: 0 %
HCT: 34 % — ABNORMAL LOW (ref 39.0–52.0)
Hemoglobin: 11.8 g/dL — ABNORMAL LOW (ref 13.0–17.0)
Immature Granulocytes: 0 %
Lymphocytes Relative: 22 %
Lymphs Abs: 2.5 10*3/uL (ref 0.7–4.0)
MCH: 29.2 pg (ref 26.0–34.0)
MCHC: 34.7 g/dL (ref 30.0–36.0)
MCV: 84.2 fL (ref 80.0–100.0)
Monocytes Absolute: 0.6 10*3/uL (ref 0.1–1.0)
Monocytes Relative: 5 %
Neutro Abs: 8.4 10*3/uL — ABNORMAL HIGH (ref 1.7–7.7)
Neutrophils Relative %: 73 %
Platelets: 375 10*3/uL (ref 150–400)
RBC: 4.04 MIL/uL — ABNORMAL LOW (ref 4.22–5.81)
RDW: 13 % (ref 11.5–15.5)
Smear Review: NORMAL
WBC: 11.6 10*3/uL — ABNORMAL HIGH (ref 4.0–10.5)
nRBC: 0 % (ref 0.0–0.2)

## 2024-03-02 LAB — BASIC METABOLIC PANEL WITH GFR
Anion gap: 12 (ref 5–15)
Anion gap: 10 (ref 5–15)
Anion gap: 8 (ref 5–15)
Anion gap: 11 (ref 5–15)
BUN: 14 mg/dL (ref 6–20)
BUN: 11 mg/dL (ref 6–20)
BUN: 11 mg/dL (ref 6–20)
BUN: 15 mg/dL (ref 6–20)
CO2: 26 mmol/L (ref 22–32)
CO2: 26 mmol/L (ref 22–32)
CO2: 26 mmol/L (ref 22–32)
CO2: 24 mmol/L (ref 22–32)
Calcium: 9.5 mg/dL (ref 8.9–10.3)
Calcium: 9.2 mg/dL (ref 8.9–10.3)
Calcium: 9.2 mg/dL (ref 8.9–10.3)
Calcium: 8.8 mg/dL — ABNORMAL LOW (ref 8.9–10.3)
Chloride: 95 mmol/L — ABNORMAL LOW (ref 98–111)
Chloride: 96 mmol/L — ABNORMAL LOW (ref 98–111)
Chloride: 97 mmol/L — ABNORMAL LOW (ref 98–111)
Chloride: 93 mmol/L — ABNORMAL LOW (ref 98–111)
Creatinine, Ser: 1.06 mg/dL (ref 0.61–1.24)
Creatinine, Ser: 0.94 mg/dL (ref 0.61–1.24)
Creatinine, Ser: 0.91 mg/dL (ref 0.61–1.24)
Creatinine, Ser: 0.92 mg/dL (ref 0.61–1.24)
GFR, Estimated: >60 mL/min (ref >60)
GFR, Estimated: >60 mL/min (ref >60)
GFR, Estimated: >60 mL/min (ref >60)
GFR, Estimated: >60 mL/min (ref >60)
Glucose, Bld: 141 mg/dL — ABNORMAL HIGH (ref 70–99)
Glucose, Bld: 200 mg/dL — ABNORMAL HIGH (ref 70–99)
Glucose, Bld: 222 mg/dL — ABNORMAL HIGH (ref 70–99)
Glucose, Bld: 187 mg/dL — ABNORMAL HIGH (ref 70–99)
Potassium: 3.2 mmol/L — ABNORMAL LOW (ref 3.5–5.1)
Potassium: 3.8 mmol/L (ref 3.5–5.1)
Potassium: 3.7 mmol/L (ref 3.5–5.1)
Potassium: 4.3 mmol/L (ref 3.5–5.1)
Sodium: 133 mmol/L — ABNORMAL LOW (ref 135–145)
Sodium: 132 mmol/L — ABNORMAL LOW (ref 135–145)
Sodium: 131 mmol/L — ABNORMAL LOW (ref 135–145)
Sodium: 128 mmol/L — ABNORMAL LOW (ref 135–145)

## 2024-03-02 LAB — GLUCOSE, CAPILLARY
Glucose-Capillary: 252 mg/dL — ABNORMAL HIGH (ref 70–99)
Glucose-Capillary: 204 mg/dL — ABNORMAL HIGH (ref 70–99)
Glucose-Capillary: 230 mg/dL — ABNORMAL HIGH (ref 70–99)
Glucose-Capillary: 271 mg/dL — ABNORMAL HIGH (ref 70–99)
Glucose-Capillary: 219 mg/dL — ABNORMAL HIGH (ref 70–99)
Glucose-Capillary: 259 mg/dL — ABNORMAL HIGH (ref 70–99)
Glucose-Capillary: 201 mg/dL — ABNORMAL HIGH (ref 70–99)

## 2024-03-02 MED ORDER — INSULIN ASPART 100 UNIT/ML IJ SOLN
0.0000 [IU] | Freq: Three times a day (TID) | INTRAMUSCULAR | Status: DC
  Administered 2024-03-02 – 2024-03-03 (×2): 5 [IU] via SUBCUTANEOUS

## 2024-03-02 MED ORDER — NOVOLOG FLEXPEN 100 UNIT/ML ~~LOC~~ SOPN
7.0000 [IU] | PEN_INJECTOR | Freq: Three times a day (TID) | SUBCUTANEOUS | 0 refills | Status: DC
  Filled 2024-03-02: qty 15, 71d supply, fill #0

## 2024-03-02 MED ORDER — LANTUS SOLOSTAR 100 UNIT/ML ~~LOC~~ SOPN
30.0000 [IU] | PEN_INJECTOR | Freq: Every day | SUBCUTANEOUS | 0 refills | Status: DC
  Filled 2024-03-02: qty 15, 50d supply, fill #0

## 2024-03-02 MED ORDER — POTASSIUM CHLORIDE 10 MEQ/100ML IV SOLN
10.0000 meq | INTRAVENOUS | Status: AC
Start: 2024-03-02 — End: 2024-03-02
  Administered 2024-03-02 (×3): 10 meq via INTRAVENOUS
  Filled 2024-03-02 (×3): qty 100

## 2024-03-02 MED ORDER — INSULIN ASPART 100 UNIT/ML IJ SOLN
0.0000 [IU] | Freq: Every day | INTRAMUSCULAR | Status: DC
  Administered 2024-03-02: 2 [IU] via SUBCUTANEOUS

## 2024-03-02 MED ORDER — HALOPERIDOL LACTATE 5 MG/ML IJ SOLN: 1.0000 mg | Freq: Four times a day (QID) | INTRAMUSCULAR | Status: DC | PRN

## 2024-03-02 MED ORDER — INSULIN ASPART 100 UNIT/ML IJ SOLN: 0.0000 [IU] | Freq: Three times a day (TID) | INTRAMUSCULAR | Status: DC

## 2024-03-02 MED ORDER — INSULIN GLARGINE-YFGN 100 UNIT/ML ~~LOC~~ SOLN
30.0000 [IU] | Freq: Every day | SUBCUTANEOUS | Status: DC
  Administered 2024-03-02 – 2024-03-03 (×2): 30 [IU] via SUBCUTANEOUS
  Filled 2024-03-02 (×3): qty 0.3

## 2024-03-02 NOTE — Inpatient Diabetes Management (Signed)
 Inpatient Diabetes Program Recommendations  AACE/ADA: New Consensus Statement on Inpatient Glycemic Control (2015)  Target Ranges:  Prepandial:   less than 140 mg/dL      Peak postprandial:   less than 180 mg/dL (1-2 hours)      Critically ill patients:  140 - 180 mg/dL   Lab Results  Component Value Date   GLUCAP 252 (H) 03/02/2024   HGBA1C 14.5 (H) 02/07/2024    Review of Glycemic Control   Diabetes history: Type 1 DM  Outpatient Diabetes medications:  Lantus  30 units daily, Novolog  7 units tid with meals  Current orders for Inpatient glycemic control:  Semglee  30 units daily   Inpatient Diabetes Program Recommendations:     -   Add Novolog  0-15 units tid + hs scale    Patient was recently d/c'd from the hospital on 02/20/24.  He was working closely outpatient with CDE on management and has Dexcom sensor and reader.    Per mother pt noncompliant, Pt reports compliance. A1c 14.5% on 02/07/2024   NOTE: Chart reviewed. Patient is well known to Inpatient Diabetes team due to multiple hospital admissions and ED visits.   Thanks,  Eloise Hake RN, MSN, BC-ADM Inpatient Diabetes Coordinator Team Pager [phone] (8a-5p)

## 2024-03-02 NOTE — Progress Notes (Signed)
 HD#1 SUBJECTIVE:  Patient Summary: Jacob [name] is a 33 y.o. with a pertinent PMH of  T1DM and shizoaffective do presenting with hyperglycemia, acidosis, and increased AG admitted for DKA  Overnight Events: on endotool  Interim History: doing better. Hard for him to deal with his meds and family situation.  Thinks the [employer] was on his case.   OBJECTIVE:  Vital Signs: Vitals:   03/02/24 0758 03/02/24 0830 03/02/24 0900 03/02/24 0927  BP:  123/79 115/81   Pulse:  84 72   Resp:  14 12   Temp: 98.6 F (37 C)   (!) 97.4 F (36.3 C)  TempSrc: Oral   Oral  SpO2:  100% 100%    Supplemental O2: Room Air SpO2: 100 %   No intake or output data in the 24 hours ending 03/02/24 0936 Net IO Since Admission: No IO data has been entered for this period [03/02/24 0936]  Physical Exam: Physical Exam Constitutional:      Appearance: He is not ill-appearing.  Cardiovascular:     Rate and Rhythm: Normal rate and regular rhythm.  Pulmonary:     Effort: Pulmonary effort is normal. No respiratory distress.     Breath sounds: No wheezing or rales.  Abdominal:     General: Abdomen is flat. There is no distension.     Palpations: Abdomen is soft.     Tenderness: There is no abdominal tenderness. There is no guarding.  Musculoskeletal:     Right lower leg: No edema.     Left lower leg: No edema.  Skin:    General: Skin is warm and dry.     Capillary Refill: Capillary refill takes less than 2 seconds.  Neurological:     Mental Status: He is oriented to person, place, and time.  Psychiatric:        Attention and Perception: He is inattentive.        Mood and Affect: Affect is not tearful.        Speech: Speech is tangential.        Thought Content: Thought content is paranoid and delusional. Thought content does not include suicidal ideation.     Patient Lines/Drains/Airways Status     Active Line/Drains/Airways     Name Placement date Placement time Site Days   Peripheral IV  03/01/24 20 G Right Antecubital 03/01/24  1530  Antecubital  1   Peripheral IV 03/01/24 20 G Anterior;Right Forearm 03/01/24  1531  Forearm  1             ASSESSMENT/PLAN:  Assessment: Active Problems:   DKA (diabetic ketoacidosis) (HCC)   Plan:  #Diabetic Ketoacidosis #Type 1 Diabetes Mellitus Continues on Endotool. AG closed overnight x1 pending 8AM labs. He still is not feeling like eating.  - Continue Endo tool until pt able to tolerate PO intake - cw BMP checks q4hrs  - Once anion gap is closed x 2, transition off Endo tool and start 30 units long-acting when patient is able to eat with a two hour overlap with endotool and subcutaneous insulin     #Schizoaffective disorder Has history of schizoaffective disorder, which is being treated with monthly injections of haloperidol . His next injection was due 03/09/2024 however he is currently actively having persecutory delusions and thinks the [employer] went to his court case. Will continue to monitor as his glucose levels normalize and may benefit from haldol  IM before leaving. Will also need benztropine  done with haldol .   #AKI Creatinine elevated  to 1.97 at admission and now 1.06. likely in the setting of DKA.    #Leukocytosis #Thrombocytosis Remains afebrile with no signs of infection. WBC 23.4 yesterday. Monitor CBC.    Diet: NPO VTE: Enoxaparin  IVF: LR,100cc/hr Code: Full   Prior to Admission Living Arrangement: Home, living with mom's other son Anticipated Discharge Location: Home Barriers to Discharge: Medical management  Signature: [facility]  Internal Medicine Resident, PGY-1 [facility]  Pager: [phone] 9:36 AM, 03/02/2024   Please contact the on call pager  [phone].

## 2024-03-03 ENCOUNTER — Other Ambulatory Visit (HOSPITAL_COMMUNITY): Payer: Self-pay

## 2024-03-03 ENCOUNTER — Inpatient Hospital Stay (HOSPITAL_COMMUNITY): Payer: MEDICAID

## 2024-03-03 DIAGNOSIS — E1010 Type 1 diabetes mellitus with ketoacidosis without coma: Secondary | ICD-10-CM | POA: Diagnosis not present

## 2024-03-03 LAB — COMPREHENSIVE METABOLIC PANEL WITH GFR
ALT: 203 U/L — ABNORMAL HIGH (ref 0–44)
AST: 393 U/L — ABNORMAL HIGH (ref 15–41)
Albumin: 3 g/dL — ABNORMAL LOW (ref 3.5–5.0)
Alkaline Phosphatase: 66 U/L (ref 38–126)
Anion gap: 9 (ref 5–15)
BUN: 17 mg/dL (ref 6–20)
CO2: 30 mmol/L (ref 22–32)
Calcium: 8.6 mg/dL — ABNORMAL LOW (ref 8.9–10.3)
Chloride: 98 mmol/L (ref 98–111)
Creatinine, Ser: 0.81 mg/dL (ref 0.61–1.24)
GFR, Estimated: >60 mL/min (ref >60)
Glucose, Bld: 102 mg/dL — ABNORMAL HIGH (ref 70–99)
Potassium: 3.4 mmol/L — ABNORMAL LOW (ref 3.5–5.1)
Sodium: 137 mmol/L (ref 135–145)
Total Bilirubin: 0.8 mg/dL (ref 0.0–1.2)
Total Protein: 5.5 g/dL — ABNORMAL LOW (ref 6.5–8.1)

## 2024-03-03 LAB — HEPATITIS PANEL, ACUTE
HCV Ab: NONREACTIVE
Hep A IgM: NONREACTIVE
Hep B C IgM: NONREACTIVE
Hepatitis B Surface Ag: NONREACTIVE

## 2024-03-03 LAB — CBC WITH DIFFERENTIAL/PLATELET
Abs Immature Granulocytes: 0.02 10*3/uL (ref 0.00–0.07)
Basophils Absolute: 0 10*3/uL (ref 0.0–0.1)
Basophils Relative: 0 %
Eosinophils Absolute: 0 10*3/uL (ref 0.0–0.5)
Eosinophils Relative: 0 %
HCT: 31.3 % — ABNORMAL LOW (ref 39.0–52.0)
Hemoglobin: 10.6 g/dL — ABNORMAL LOW (ref 13.0–17.0)
Immature Granulocytes: 0 %
Lymphocytes Relative: 50 %
Lymphs Abs: 3.5 10*3/uL (ref 0.7–4.0)
MCH: 28.7 pg (ref 26.0–34.0)
MCHC: 33.9 g/dL (ref 30.0–36.0)
MCV: 84.8 fL (ref 80.0–100.0)
Monocytes Absolute: 0.4 10*3/uL (ref 0.1–1.0)
Monocytes Relative: 6 %
Neutro Abs: 3.1 10*3/uL (ref 1.7–7.7)
Neutrophils Relative %: 44 %
Platelets: 358 10*3/uL (ref 150–400)
RBC: 3.69 MIL/uL — ABNORMAL LOW (ref 4.22–5.81)
RDW: 12.8 % (ref 11.5–15.5)
WBC: 7.1 10*3/uL (ref 4.0–10.5)
nRBC: 0 % (ref 0.0–0.2)

## 2024-03-03 LAB — GLUCOSE, CAPILLARY
Glucose-Capillary: 107 mg/dL — ABNORMAL HIGH (ref 70–99)
Glucose-Capillary: 55 mg/dL — ABNORMAL LOW (ref 70–99)
Glucose-Capillary: 254 mg/dL — ABNORMAL HIGH (ref 70–99)

## 2024-03-03 LAB — CK: Total CK: 76 U/L (ref 49–397)

## 2024-03-03 MED ORDER — ENOXAPARIN SODIUM 40 MG/0.4ML IJ SOSY: 40.0000 mg | PREFILLED_SYRINGE | INTRAMUSCULAR | Status: DC
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
## 2024-03-03 NOTE — Plan of Care (Signed)
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
## 2024-03-03 NOTE — Discharge Instructions (Addendum)
 [name] are admitted to the hospital because your glucose was elevated and you had diabetic ketoacidosis.  You are dependent on insulin .  If your body does not have insulin , glucose stays in your blood and is not able to nourish your body.  This leads to weight loss, nausea vomiting and even altered mental status.  You are dealing with a lot of other stressors but it is very important that you take your insulin .  Having elevated glucose levels in your blood can even cause death.  I do strongly encouraged that you keep taking your insulin  as prescribed.  For this it is also important that you take control of your schizophrenia.  Earlier in April you were seen by behavioral health because you are having hallucinations and delusions.  You were changed medications and were given a medication called Haldol  intramuscularly.  This medication can cause changes in your liver function.  When you came in for DKA during this hospitalization we noticed that your liver function tests were increased.  We are worried that this may be a side effect of the medication you received injected intramuscularly early in April.  The medication is starting to wean off and you are due for another injection on May 9.  You are starting to have delusions that will only get worse if you are not on medication to control your schizophrenia.  For this you need to be seen by behavioral health.  Behavioral health has a walk-in clinic at:  [address], [place] [address]  Phone [phone]  Hours Open 24/7. No appointment required.  I do strongly encourage that you go there today to follow-up on these symptoms before they get worse and you are not able to take care of yourself.  For your diabetes:  Inject 30 units of the glargine daily. Use 7 units of the short acting insulin  (NovoLog ) 3 times a day before meals.  You do have an appointment internal medicine clinic to follow-up with Dr. Teodora Fell on 03/08/2024 at  2 PM.  Please do not hesitate to call us  should you have any questions at [phone]  Sincerely, Your internal medicine team

## 2024-03-06 LAB — CULTURE, BLOOD (ROUTINE X 2)
Culture: NO GROWTH
Culture: NO GROWTH

## 2024-03-08 ENCOUNTER — Encounter: Payer: MEDICAID | Admitting: Student

## 2024-03-14 ENCOUNTER — Telehealth: Payer: Self-pay | Admitting: *Deleted

## 2024-03-14 NOTE — Telephone Encounter (Signed)
 Message sent to front office to schedule an appointment. Copied from CRM [id number]. Topic: Appointments - Appointment Scheduling >> Mar 13, 2024  5:41 PM Tiffany H wrote: Patient/patient representative is calling to schedule an appointment. Refer to attachments for appointment information.   Follow up. Patient's mother denied patient going to ED but there's an interactions on 03/01/24.

## 2024-03-19 ENCOUNTER — Telehealth (INDEPENDENT_AMBULATORY_CARE_PROVIDER_SITE_OTHER): Payer: MEDICAID | Admitting: Licensed Clinical Social Worker

## 2024-03-19 ENCOUNTER — Ambulatory Visit: Payer: MEDICAID | Admitting: Licensed Clinical Social Worker

## 2024-03-19 DIAGNOSIS — F250 Schizoaffective disorder, bipolar type: Secondary | ICD-10-CM

## 2024-03-19 NOTE — Telephone Encounter (Signed)
 LCSW-A / [facility] received [employer]) documents, including a Function Report requiring detailed input regarding the patient's daily functioning. [facility] made an attempt to engage the patient; however, the patient's mother reported that he had just been dropped off and was currently unavailable.   Due to the specificity of the form and the need for the patient to directly respond to questions about his functional abilities, the form cannot be completed without the patient's participation.   The patient and his mother were advised to schedule an in-person appointment with the [facility] for completion of the form or to contact the [facility]SA directly at [phone] as indicated on the form for additional assistance.  Amie Bald, MSW, LCSW-A She/Her Behavioral Health Clinician [facility]  Internal Medicine Center Direct Dial :[phone]  Fax [phone] Main Office Phone: [phone] [address] [place], [place] [address] Website: [facility] Internal Medicine [facility]  [place], [place]  [facility]

## 2024-03-19 NOTE — BH Specialist Note (Signed)
 LCSW-A / [facility] received [employer]) documents, including a Function Report requiring detailed input regarding the patient's daily functioning. [facility] made an attempt to engage the patient; however, the patient's mother reported that he had just been dropped off and was currently unavailable.   Due to the specificity of the form and the need for the patient to directly respond to questions about his functional abilities, the form cannot be completed without the patient's participation.   The patient and his mother were advised to schedule an in-person appointment with the [facility] for completion of the form or to contact the [facility]SA directly at [phone] as indicated on the form for additional assistance.  Amie Bald, MSW, LCSW-A She/Her Behavioral Health Clinician [facility]  Internal Medicine Center Direct Dial :[phone]  Fax [phone] Main Office Phone: [phone] [address] [place], [place] [address] Website: [facility] Internal Medicine [facility]  [place], [place]  [facility]

## 2024-03-19 NOTE — Telephone Encounter (Signed)
 Please see message below in regards to paperwork  Copied from CRM [id number]. Topic: General - Other >> Mar 19, 2024 12:19 PM Shamecia H wrote: Reason for CRM: Patients mom is on her way to have someone help her fill out some papers for SSI.

## 2024-03-20 ENCOUNTER — Other Ambulatory Visit: Payer: Self-pay

## 2024-03-20 ENCOUNTER — Ambulatory Visit (INDEPENDENT_AMBULATORY_CARE_PROVIDER_SITE_OTHER): Payer: MEDICAID | Admitting: Student

## 2024-03-20 VITALS — BP 119/73 | HR 68 | Temp 97.7°F | Ht 69.0 in | Wt 137.4 lb

## 2024-03-20 DIAGNOSIS — E1065 Type 1 diabetes mellitus with hyperglycemia: Secondary | ICD-10-CM | POA: Diagnosis not present

## 2024-03-20 DIAGNOSIS — R7989 Other specified abnormal findings of blood chemistry: Secondary | ICD-10-CM

## 2024-03-20 DIAGNOSIS — E1011 Type 1 diabetes mellitus with ketoacidosis with coma: Secondary | ICD-10-CM

## 2024-03-20 DIAGNOSIS — F250 Schizoaffective disorder, bipolar type: Secondary | ICD-10-CM | POA: Diagnosis not present

## 2024-03-20 DIAGNOSIS — Z794 Long term (current) use of insulin: Secondary | ICD-10-CM | POA: Diagnosis not present

## 2024-03-20 LAB — POCT URINALYSIS DIPSTICK
Bilirubin, UA: NEGATIVE
Blood, UA: NEGATIVE
Glucose, UA: POSITIVE — AB
Ketones, UA: NEGATIVE
Leukocytes, UA: NEGATIVE
Nitrite, UA: NEGATIVE
Protein, UA: NEGATIVE
Spec Grav, UA: 1.02 (ref 1.010–1.025)
Urobilinogen, UA: 0.2 U/dL
pH, UA: 6 (ref 5.0–8.0)

## 2024-03-20 LAB — GLUCOSE, CAPILLARY: Glucose-Capillary: 399 mg/dL — ABNORMAL HIGH (ref 70–99)

## 2024-03-20 MED ORDER — NOVOLOG FLEXPEN 100 UNIT/ML ~~LOC~~ SOPN: 7.0000 [IU] | PEN_INJECTOR | Freq: Three times a day (TID) | SUBCUTANEOUS | 0 refills | Status: DC

## 2024-03-20 MED ORDER — LANTUS SOLOSTAR 100 UNIT/ML ~~LOC~~ SOPN: 30.0000 [IU] | PEN_INJECTOR | Freq: Every day | SUBCUTANEOUS | 0 refills | Status: DC

## 2024-03-20 MED ORDER — DEXCOM G6 SENSOR MISC: 3 refills | Status: DC

## 2024-03-20 NOTE — Progress Notes (Signed)
 CC: Hospital follow-up  HPI: [name] is a 33 y.o. male living with a history stated below and presents today for hospital follow-up. Please see problem based assessment and plan for additional details.  Past Medical History:  Diagnosis Date   Bipolar 1 disorder (HCC)    History of attempted suicide 2019   Unsuccessful suicide attempt in 2019 with rat poison   Schizoaffective disorder (HCC)    Type 1 diabetes mellitus on insulin  therapy (HCC) 2019   Vitamin D  deficiency 01/03/2024    Current Outpatient Medications on File Prior to Visit  Medication Sig Dispense Refill   benztropine  (COGENTIN ) 0.5 MG tablet Take 1 tablet (0.5 mg total) by mouth 2 (two) times daily. For prevention of EPS (Patient not taking: Reported on 02/19/2024) 60 tablet 0   haloperidol  decanoate (HALDOL  DECANOATE) 100 MG/ML injection Inject 1 mL (100 mg total) into the muscle every 30 (thirty) days. (Due on 03-09-24): For mood control (Patient not taking: Reported on 02/19/2024) 1 mL 0   hydrOXYzine  (ATARAX ) 25 MG tablet Take 25 mg by mouth 3 (three) times daily as needed for anxiety.     traZODone  (DESYREL ) 50 MG tablet Take 50 mg by mouth at bedtime.     Vitamin D , Ergocalciferol , (DRISDOL ) 1.25 MG (50000 UNIT) CAPS capsule Take 1 capsule (50,000 Units total) by mouth every 7 (seven) days. (Patient not taking: Reported on 02/19/2024) 5 capsule 0   No current facility-administered medications on file prior to visit.    Family History  Problem Relation Age of Onset   Healthy Mother    Healthy Father     Social History   Socioeconomic History   Marital status: Single    Spouse name: Not on file   Number of children: Not on file   Years of education: Not on file   Highest education level: Not on file  Occupational History   Not on file  Tobacco Use   Smoking status: Former    Types: Cigars, Cigarettes   Smokeless tobacco: Never   Tobacco comments:    2 BLACK AND MILD A DAY  Vaping Use   Vaping  status: Every Day  Substance and Sexual Activity   Alcohol use: Yes    Comment: social/occassional   Drug use: Not Currently    Types: Marijuana   Sexual activity: Not on file  Other Topics Concern   Not on file  Social History Narrative   Not on file   Social Drivers of Health   Financial Resource Strain: Not on file  Food Insecurity: Food Insecurity Present (03/02/2024)   Hunger Vital Sign    Worried About Running Out of Food in the Last Year: Often true    Ran Out of Food in the Last Year: Often true  Transportation Needs: Unmet Transportation Needs (03/02/2024)   PRAPARE - Administrator, Civil Service (Medical): Yes    Lack of Transportation (Non-Medical): Yes  Physical Activity: Not on file  Stress: Not on file  Social Connections: Socially Isolated (03/02/2024)   Social Connection and Isolation Panel [NHANES]    Frequency of Communication with Friends and Family: Never    Frequency of Social Gatherings with Friends and Family: Never    Attends Religious Services: Never    Database administrator or Organizations: No    Attends Banker Meetings: Never    Marital Status: Never married  Intimate Partner Violence: Not At Risk (03/02/2024)   Humiliation, Afraid, Rape,  and Kick questionnaire    Fear of Current or Ex-Partner: No    Emotionally Abused: No    Physically Abused: No    Sexually Abused: No    Review of Systems: ROS negative except for what is noted on the assessment and plan.  Vitals:   03/20/24 1403  BP: 119/73  Pulse: 68  Temp: 97.7 F (36.5 C)  TempSrc: Oral  SpO2: 100%  Weight: 137 lb 6.4 oz (62.3 kg)  Height: 5\' 9"  (1.753 m)    Physical Exam: Constitutional: well-appearing in no acute distress HENT: normocephalic atraumatic, mucous membranes moist Eyes: conjunctiva non-erythematous Neck: supple Cardiovascular: regular rate and rhythm, no m/r/g Pulmonary/Chest: normal work of breathing on room air, lungs clear to  auscultation bilaterally Abdominal: soft, non-tender, non-distended MSK: normal bulk and tone Neurological: alert & oriented x 3, 5/5 strength in bilateral upper and lower extremities, normal gait Skin: warm and dry Psych: No SI/HI or hallucinations, normal mood and affect   Assessment & Plan:   Uncontrolled type 1 diabetes mellitus with hyperglycemia, with long-term current use of insulin  (HCC) Patiently recently discharged from hospital on 5/3 for DKA.  Of note, patient has had numerous admissions for DKA secondary to medication noncompliance.  His current treatment regimen includes 30 units of Semglee  and 7 units of NovoLog  3 times daily.  Today, he notes good adherence.  He has been unable to measure his glucose and denies any subjective signs or symptoms of hypoglycemia or hyperglycemia.  Point-of-care glucose today was notable for value of approximately 400.  Point-of-care UA was negative for any ketones.  At this time, we will hold off on referring to ED as patient is also asymptomatic as there is low concern for DKA.  Encouraged him to pick up his medications and to follow-up in 1 week for further evaluation of his daily glucose levels. - Continue 30 units of Semglee  daily and 7 units of NovoLog  3 times daily - Refill for Dexcom - Follow-up in one week   Schizoaffective disorder, bipolar type (HCC) On his previous admission, his LFTs were elevated.  Of note, he has a history of schizoaffective disorder managed by [facility].  On discharge, he was approved by [facility].  He notes that he has been following up with both of these providers.  Today, he denies any hallucinations, SI, HI, or other signs or symptoms.  Will check CMP for reevaluation of his LFTs.  Also encouraged him to continue follow-up with these behavioral health providers for continued management. - Follow-up CMP - Encourage follow-up with behavioral health  Patient discussed with Dr.  Angelica Bard, MD  [facility] Internal Medicine, PGY-1 Date 03/20/2024 Time 3:47 PM

## 2024-03-20 NOTE — Assessment & Plan Note (Signed)
 Patiently recently discharged from hospital on 5/3 for DKA.  Of note, patient has had numerous admissions for DKA secondary to medication noncompliance.  His current treatment regimen includes 30 units of Semglee  and 7 units of NovoLog  3 times daily.  Today, he notes good adherence.  He has been unable to measure his glucose and denies any subjective signs or symptoms of hypoglycemia or hyperglycemia.  Point-of-care glucose today was notable for value of approximately 400.  Point-of-care UA was negative for any ketones.  At this time, we will hold off on referring to ED as patient is also asymptomatic as there is low concern for DKA.  Encouraged him to pick up his medications and to follow-up in 1 week for further evaluation of his daily glucose levels. - Continue 30 units of Semglee  daily and 7 units of NovoLog  3 times daily - Refill for Dexcom - Follow-up in one week

## 2024-03-20 NOTE — Assessment & Plan Note (Signed)
 On his previous admission, his LFTs were elevated.  Of note, he has a history of schizoaffective disorder managed by [facility].  On discharge, he was approved by [facility].  He notes that he has been following up with both of these providers.  Today, he denies any hallucinations, SI, HI, or other signs or symptoms.  Will check CMP for reevaluation of his LFTs.  Also encouraged him to continue follow-up with these behavioral health providers for continued management. - Follow-up CMP - Encourage follow-up with behavioral health

## 2024-03-20 NOTE — Patient Instructions (Addendum)
 Thank you so much for coming to the clinic today!   Please continue to follow up with [facility] and [facility].   Please follow-up with the clinic in one week.   I'll call back regarding your labs.   If you have any questions please feel free to the call the clinic at anytime at [phone]. It was a pleasure seeing you!  Best, Dr. Carolee Churchman

## 2024-03-21 ENCOUNTER — Inpatient Hospital Stay: Payer: MEDICAID | Admitting: Student

## 2024-03-21 LAB — CMP14 + ANION GAP
ALT: 58 IU/L — ABNORMAL HIGH (ref 0–44)
AST: 27 IU/L (ref 0–40)
Albumin: 3.8 g/dL — ABNORMAL LOW (ref 4.1–5.1)
Alkaline Phosphatase: 107 IU/L (ref 44–121)
Anion Gap: 14 mmol/L (ref 10.0–18.0)
BUN/Creatinine Ratio: 18 (ref 9–20)
BUN: 16 mg/dL (ref 6–20)
Bilirubin Total: 0.2 mg/dL (ref 0.0–1.2)
CO2: 25 mmol/L (ref 20–29)
Calcium: 9.5 mg/dL (ref 8.7–10.2)
Chloride: 96 mmol/L (ref 96–106)
Creatinine, Ser: 0.89 mg/dL (ref 0.76–1.27)
Globulin, Total: 2.2 g/dL (ref 1.5–4.5)
Glucose: 369 mg/dL — ABNORMAL HIGH (ref 70–99)
Potassium: 4.5 mmol/L (ref 3.5–5.2)
Sodium: 135 mmol/L (ref 134–144)
Total Protein: 6 g/dL (ref 6.0–8.5)
eGFR: 117 mL/min/{1.73_m2} (ref >59)

## 2024-03-23 NOTE — Progress Notes (Signed)
 Internal Medicine Clinic Attending  Case discussed with the resident at the time of the visit.  We reviewed the resident's history and exam and pertinent patient test results.  I agree with the assessment, diagnosis, and plan of care documented in the resident's note.    I am glad his LFTs are trending down. We will continue to monitor these.  We won't make adjustments to insulin  yet since we don't have glucose monitorings, but he is going to pick up his CGM and will follow in 1 week. At that visit, place CGM if it's not on yet.

## 2024-03-27 ENCOUNTER — Ambulatory Visit: Payer: Self-pay | Admitting: Student

## 2024-03-28 ENCOUNTER — Encounter: Payer: MEDICAID | Admitting: Student

## 2024-03-28 NOTE — Progress Notes (Signed)
 Called patient to discuss lab results but no reply.  See previous note for plan.  Will send letter at this time.

## 2024-05-03 ENCOUNTER — Other Ambulatory Visit: Payer: Self-pay

## 2024-05-03 ENCOUNTER — Inpatient Hospital Stay (HOSPITAL_COMMUNITY)
Admission: EM | Admit: 2024-05-03 | Discharge: 2024-05-05 | DRG: 638 | Disposition: A | Discharge status: Home / Self Care | Payer: MEDICAID | Attending: Internal Medicine | Admitting: Internal Medicine

## 2024-05-03 ENCOUNTER — Encounter (HOSPITAL_COMMUNITY): Payer: Self-pay | Admitting: *Deleted

## 2024-05-03 DIAGNOSIS — Z59819 Housing instability, housed unspecified: Secondary | ICD-10-CM

## 2024-05-03 DIAGNOSIS — E1110 Type 2 diabetes mellitus with ketoacidosis without coma: Secondary | ICD-10-CM | POA: Diagnosis present

## 2024-05-03 DIAGNOSIS — E860 Dehydration: Secondary | ICD-10-CM

## 2024-05-03 DIAGNOSIS — E1065 Type 1 diabetes mellitus with hyperglycemia: Secondary | ICD-10-CM | POA: Diagnosis present

## 2024-05-03 DIAGNOSIS — E1011 Type 1 diabetes mellitus with ketoacidosis with coma: Secondary | ICD-10-CM

## 2024-05-03 DIAGNOSIS — E1010 Type 1 diabetes mellitus with ketoacidosis without coma: Principal | ICD-10-CM

## 2024-05-03 DIAGNOSIS — F17210 Nicotine dependence, cigarettes, uncomplicated: Secondary | ICD-10-CM | POA: Diagnosis present

## 2024-05-03 DIAGNOSIS — Z560 Unemployment, unspecified: Secondary | ICD-10-CM

## 2024-05-03 DIAGNOSIS — N179 Acute kidney failure, unspecified: Secondary | ICD-10-CM | POA: Diagnosis present

## 2024-05-03 DIAGNOSIS — E0810 Diabetes mellitus due to underlying condition with ketoacidosis without coma: Principal | ICD-10-CM

## 2024-05-03 DIAGNOSIS — R000 Tachycardia, unspecified: Secondary | ICD-10-CM | POA: Diagnosis present

## 2024-05-03 DIAGNOSIS — R739 Hyperglycemia, unspecified: Secondary | ICD-10-CM

## 2024-05-03 DIAGNOSIS — Z5900 Homelessness unspecified: Secondary | ICD-10-CM

## 2024-05-03 DIAGNOSIS — Z91148 Patient's other noncompliance with medication regimen for other reason: Secondary | ICD-10-CM

## 2024-05-03 DIAGNOSIS — Z720 Tobacco use: Secondary | ICD-10-CM | POA: Diagnosis present

## 2024-05-03 DIAGNOSIS — E875 Hyperkalemia: Secondary | ICD-10-CM | POA: Insufficient documentation

## 2024-05-03 DIAGNOSIS — D72829 Elevated white blood cell count, unspecified: Secondary | ICD-10-CM | POA: Insufficient documentation

## 2024-05-03 DIAGNOSIS — Z79899 Other long term (current) drug therapy: Secondary | ICD-10-CM

## 2024-05-03 DIAGNOSIS — F259 Schizoaffective disorder, unspecified: Secondary | ICD-10-CM | POA: Diagnosis present

## 2024-05-03 LAB — BASIC METABOLIC PANEL WITH GFR
Anion gap: 20 — ABNORMAL HIGH (ref 5–15)
Anion gap: 20 — ABNORMAL HIGH (ref 5–15)
Anion gap: 20 — ABNORMAL HIGH (ref 5–15)
BUN: 16 mg/dL (ref 6–20)
BUN: 16 mg/dL (ref 6–20)
BUN: 12 mg/dL (ref 6–20)
CO2: 9 mmol/L — ABNORMAL LOW (ref 22–32)
CO2: 10 mmol/L — ABNORMAL LOW (ref 22–32)
CO2: 12 mmol/L — ABNORMAL LOW (ref 22–32)
Calcium: 9.6 mg/dL (ref 8.9–10.3)
Calcium: 10.1 mg/dL (ref 8.9–10.3)
Calcium: 9.5 mg/dL (ref 8.9–10.3)
Chloride: 110 mmol/L (ref 98–111)
Chloride: 109 mmol/L (ref 98–111)
Chloride: 104 mmol/L (ref 98–111)
Creatinine, Ser: 1.44 mg/dL — ABNORMAL HIGH (ref 0.61–1.24)
Creatinine, Ser: 1.53 mg/dL — ABNORMAL HIGH (ref 0.61–1.24)
Creatinine, Ser: 1.38 mg/dL — ABNORMAL HIGH (ref 0.61–1.24)
GFR, Estimated: >60 mL/min (ref >60)
GFR, Estimated: >60 mL/min (ref >60)
GFR, Estimated: >60 mL/min (ref >60)
Glucose, Bld: 127 mg/dL — ABNORMAL HIGH (ref 70–99)
Glucose, Bld: 135 mg/dL — ABNORMAL HIGH (ref 70–99)
Glucose, Bld: 261 mg/dL — ABNORMAL HIGH (ref 70–99)
Potassium: 5.1 mmol/L (ref 3.5–5.1)
Potassium: 5.4 mmol/L — ABNORMAL HIGH (ref 3.5–5.1)
Potassium: 4.9 mmol/L (ref 3.5–5.1)
Sodium: 139 mmol/L (ref 135–145)
Sodium: 139 mmol/L (ref 135–145)
Sodium: 136 mmol/L (ref 135–145)

## 2024-05-03 LAB — GLUCOSE, CAPILLARY
Glucose-Capillary: 149 mg/dL — ABNORMAL HIGH (ref 70–99)
Glucose-Capillary: 214 mg/dL — ABNORMAL HIGH (ref 70–99)
Glucose-Capillary: 215 mg/dL — ABNORMAL HIGH (ref 70–99)
Glucose-Capillary: 233 mg/dL — ABNORMAL HIGH (ref 70–99)

## 2024-05-03 LAB — I-STAT VENOUS BLOOD GAS, ED
Acid-base deficit: 23 mmol/L — ABNORMAL HIGH (ref 0.0–2.0)
Bicarbonate: 5.2 mmol/L — ABNORMAL LOW (ref 20.0–28.0)
Calcium, Ion: 1.19 mmol/L (ref 1.15–1.40)
HCT: 51 % (ref 39.0–52.0)
Hemoglobin: 17.3 g/dL — ABNORMAL HIGH (ref 13.0–17.0)
O2 Saturation: 93 %
Potassium: 5.9 mmol/L — ABNORMAL HIGH (ref 3.5–5.1)
Sodium: 131 mmol/L — ABNORMAL LOW (ref 135–145)
TCO2: 6 mmol/L — ABNORMAL LOW (ref 22–32)
pCO2, Ven: 18.9 mmHg — CL (ref 44–60)
pH, Ven: 7.051 — CL (ref 7.25–7.43)
pO2, Ven: 91 mmHg — ABNORMAL HIGH (ref 32–45)

## 2024-05-03 LAB — URINALYSIS, ROUTINE W REFLEX MICROSCOPIC
Bilirubin Urine: NEGATIVE
Bilirubin Urine: NEGATIVE
Glucose, UA: >=500 mg/dL — AB
Glucose, UA: >=500 mg/dL — AB
Hgb urine dipstick: NEGATIVE
Hgb urine dipstick: NEGATIVE
Ketones, ur: 80 mg/dL — AB
Ketones, ur: 80 mg/dL — AB
Leukocytes,Ua: NEGATIVE
Leukocytes,Ua: NEGATIVE
Nitrite: NEGATIVE
Nitrite: NEGATIVE
Protein, ur: 30 mg/dL — AB
Protein, ur: 30 mg/dL — AB
Specific Gravity, Urine: 1.018 (ref 1.005–1.030)
Specific Gravity, Urine: 1.016 (ref 1.005–1.030)
pH: 5 (ref 5.0–8.0)
pH: 5 (ref 5.0–8.0)

## 2024-05-03 LAB — HEMOGLOBIN A1C
Hgb A1c MFr Bld: 14.9 % — ABNORMAL HIGH (ref 4.8–5.6)
Mean Plasma Glucose: 380.93 mg/dL

## 2024-05-03 LAB — CBC
HCT: 50.1 % (ref 39.0–52.0)
Hemoglobin: 16 g/dL (ref 13.0–17.0)
MCH: 29.3 pg (ref 26.0–34.0)
MCHC: 31.9 g/dL (ref 30.0–36.0)
MCV: 91.6 fL (ref 80.0–100.0)
Platelets: 503 10*3/uL — ABNORMAL HIGH (ref 150–400)
RBC: 5.47 MIL/uL (ref 4.22–5.81)
RDW: 12.3 % (ref 11.5–15.5)
WBC: 24.8 10*3/uL — ABNORMAL HIGH (ref 4.0–10.5)
nRBC: 0 % (ref 0.0–0.2)

## 2024-05-03 LAB — CBC WITH DIFFERENTIAL/PLATELET
Abs Immature Granulocytes: 0 10*3/uL (ref 0.00–0.07)
Basophils Absolute: 0 10*3/uL (ref 0.0–0.1)
Basophils Relative: 0 %
Eosinophils Absolute: 0 10*3/uL (ref 0.0–0.5)
Eosinophils Relative: 0 %
HCT: 47.5 % (ref 39.0–52.0)
Hemoglobin: 15.7 g/dL (ref 13.0–17.0)
Lymphocytes Relative: 10 %
Lymphs Abs: 2.7 10*3/uL (ref 0.7–4.0)
MCH: 29.9 pg (ref 26.0–34.0)
MCHC: 33.1 g/dL (ref 30.0–36.0)
MCV: 90.5 fL (ref 80.0–100.0)
Monocytes Absolute: 1.1 10*3/uL — ABNORMAL HIGH (ref 0.1–1.0)
Monocytes Relative: 4 %
Neutro Abs: 22.8 10*3/uL — ABNORMAL HIGH (ref 1.7–7.7)
Neutrophils Relative %: 86 %
Platelets: 419 10*3/uL — ABNORMAL HIGH (ref 150–400)
RBC: 5.25 MIL/uL (ref 4.22–5.81)
RDW: 12.5 % (ref 11.5–15.5)
WBC: 26.5 10*3/uL — ABNORMAL HIGH (ref 4.0–10.5)
nRBC: 0 % (ref 0.0–0.2)
nRBC: 0 /100{WBCs}

## 2024-05-03 LAB — COMPREHENSIVE METABOLIC PANEL WITH GFR
ALT: 40 U/L (ref 0–44)
AST: 38 U/L (ref 15–41)
Albumin: 5.2 g/dL — ABNORMAL HIGH (ref 3.5–5.0)
Alkaline Phosphatase: 90 U/L (ref 38–126)
BUN: 21 mg/dL — ABNORMAL HIGH (ref 6–20)
CO2: <7 mmol/L — ABNORMAL LOW (ref 22–32)
Calcium: 9.8 mg/dL (ref 8.9–10.3)
Chloride: 99 mmol/L (ref 98–111)
Creatinine, Ser: 1.88 mg/dL — ABNORMAL HIGH (ref 0.61–1.24)
GFR, Estimated: 48 mL/min — ABNORMAL LOW (ref >60)
Glucose, Bld: 567 mg/dL (ref 70–99)
Potassium: 6.3 mmol/L (ref 3.5–5.1)
Sodium: 136 mmol/L (ref 135–145)
Total Bilirubin: 1.8 mg/dL — ABNORMAL HIGH (ref 0.0–1.2)
Total Protein: 8.5 g/dL — ABNORMAL HIGH (ref 6.5–8.1)

## 2024-05-03 LAB — I-STAT CHEM 8, ED
BUN: 25 mg/dL — ABNORMAL HIGH (ref 6–20)
Calcium, Ion: 1.19 mmol/L (ref 1.15–1.40)
Chloride: 107 mmol/L (ref 98–111)
Creatinine, Ser: 1.2 mg/dL (ref 0.61–1.24)
Glucose, Bld: 567 mg/dL (ref 70–99)
HCT: 52 % (ref 39.0–52.0)
Hemoglobin: 17.7 g/dL — ABNORMAL HIGH (ref 13.0–17.0)
Potassium: 6 mmol/L — ABNORMAL HIGH (ref 3.5–5.1)
Sodium: 132 mmol/L — ABNORMAL LOW (ref 135–145)
TCO2: 8 mmol/L — ABNORMAL LOW (ref 22–32)

## 2024-05-03 LAB — CBG MONITORING, ED
Glucose-Capillary: 491 mg/dL — ABNORMAL HIGH (ref 70–99)
Glucose-Capillary: 550 mg/dL (ref 70–99)
Glucose-Capillary: 409 mg/dL — ABNORMAL HIGH (ref 70–99)
Glucose-Capillary: 305 mg/dL — ABNORMAL HIGH (ref 70–99)
Glucose-Capillary: 234 mg/dL — ABNORMAL HIGH (ref 70–99)
Glucose-Capillary: 124 mg/dL — ABNORMAL HIGH (ref 70–99)

## 2024-05-03 LAB — BETA-HYDROXYBUTYRIC ACID
Beta-Hydroxybutyric Acid: 7.5 mmol/L — ABNORMAL HIGH (ref 0.05–0.27)
Beta-Hydroxybutyric Acid: 7.36 mmol/L — ABNORMAL HIGH (ref 0.05–0.27)

## 2024-05-03 MED ORDER — LACTATED RINGERS IV BOLUS
1000.0000 mL | Freq: Once | INTRAVENOUS | Status: AC
  Administered 2024-05-03: 1000 mL via INTRAVENOUS

## 2024-05-03 MED ORDER — ACETAMINOPHEN 325 MG PO TABS: 650.0000 mg | ORAL_TABLET | Freq: Four times a day (QID) | ORAL | Status: DC | PRN

## 2024-05-03 MED ORDER — SENNOSIDES-DOCUSATE SODIUM 8.6-50 MG PO TABS
1.0000 | ORAL_TABLET | Freq: Every evening | ORAL | Status: DC | PRN
Start: 2024-05-03 — End: 2024-05-05

## 2024-05-03 MED ORDER — CALCIUM GLUCONATE-NACL 1-0.675 GM/50ML-% IV SOLN
1.0000 g | Freq: Once | INTRAVENOUS | Status: AC
  Administered 2024-05-03: 1000 mg via INTRAVENOUS
  Filled 2024-05-03: qty 50

## 2024-05-03 MED ORDER — DEXTROSE 50 % IV SOLN: 0.0000 mL | INTRAVENOUS | Status: DC | PRN

## 2024-05-03 MED ORDER — LACTATED RINGERS IV BOLUS
20.0000 mL/kg | Freq: Once | INTRAVENOUS | Status: AC
  Administered 2024-05-03: 1246 mL via INTRAVENOUS

## 2024-05-03 MED ORDER — TRAZODONE HCL 50 MG PO TABS
50.0000 mg | ORAL_TABLET | Freq: Every day | ORAL | Status: DC
  Administered 2024-05-03 – 2024-05-04 (×2): 50 mg via ORAL
  Filled 2024-05-03 (×2): qty 1

## 2024-05-03 MED ORDER — DEXTROSE IN LACTATED RINGERS 5 % IV SOLN: INTRAVENOUS | Status: AC

## 2024-05-03 MED ORDER — CALCIUM GLUCONATE 10 % IV SOLN
1.0000 g | Freq: Once | INTRAVENOUS | Status: AC
  Administered 2024-05-03: 1 g via INTRAVENOUS
  Filled 2024-05-03: qty 10

## 2024-05-03 MED ORDER — ONDANSETRON HCL 4 MG/2ML IJ SOLN
4.0000 mg | Freq: Once | INTRAMUSCULAR | Status: AC
  Administered 2024-05-03: 4 mg via INTRAVENOUS
  Filled 2024-05-03: qty 2

## 2024-05-03 MED ORDER — LACTATED RINGERS IV SOLN
INTRAVENOUS | Status: AC
Start: 2024-05-03 — End: 2024-05-04

## 2024-05-03 MED ORDER — ACETAMINOPHEN 650 MG RE SUPP
650.0000 mg | Freq: Four times a day (QID) | RECTAL | Status: DC | PRN
Start: 2024-05-03 — End: 2024-05-05

## 2024-05-03 MED ORDER — HYDROXYZINE HCL 25 MG PO TABS: 25.0000 mg | ORAL_TABLET | Freq: Three times a day (TID) | ORAL | Status: DC | PRN

## 2024-05-03 MED ORDER — BENZTROPINE MESYLATE 0.5 MG PO TABS
0.5000 mg | ORAL_TABLET | Freq: Two times a day (BID) | ORAL | Status: DC
  Administered 2024-05-03 – 2024-05-05 (×4): 0.5 mg via ORAL
  Filled 2024-05-03 (×5): qty 1

## 2024-05-03 MED ORDER — ENOXAPARIN SODIUM 40 MG/0.4ML IJ SOSY
40.0000 mg | PREFILLED_SYRINGE | INTRAMUSCULAR | Status: DC
  Administered 2024-05-03 – 2024-05-04 (×2): 40 mg via SUBCUTANEOUS
  Filled 2024-05-03 (×2): qty 0.4

## 2024-05-03 MED ORDER — SODIUM BICARBONATE 8.4 % IV SOLN
50.0000 meq | Freq: Once | INTRAVENOUS | Status: AC
  Administered 2024-05-03: 50 meq via INTRAVENOUS
  Filled 2024-05-03: qty 50

## 2024-05-03 MED ORDER — INSULIN REGULAR(HUMAN) IN NACL 100-0.9 UT/100ML-% IV SOLN
INTRAVENOUS | Status: DC
  Administered 2024-05-03: 6.5 [IU]/h via INTRAVENOUS
  Administered 2024-05-03: 2.6 [IU]/h via INTRAVENOUS
  Filled 2024-05-03: qty 100
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
## 2024-05-03 NOTE — ED Triage Notes (Addendum)
 Pt reports that he his diabetic and he took insulin  last yesterday.  No insulin  today.  Pt began having n/v this am.  Pt is actively vomiting during triage.

## 2024-05-03 NOTE — ED Triage Notes (Addendum)
 PT arrives via POV. PT reports abdominal pain, nausea, and vomiting since this morning. States he is a diabetic but has not checked his blood sugar. Pt is AxOx4. PT does have some labored breathing and is tachycardic during triage. Hx of dka.

## 2024-05-03 NOTE — ED Provider Notes (Addendum)
 [place] EMERGENCY DEPARTMENT AT [facility] Provider Note   CSN: [id number] Arrival date & time: 05/03/24  1246     Patient presents with: Abdominal Pain, Vomiting, and Hyperglycemia   Jacob [name] is a 33 y.o. male.   Patient with hx iddm, with nausea/vomiting, high blood sugars and general weakness in the past few days. No bloody or bilious emesis.  Pt indicates did not take his insulin  yesterday, but has adequate supply. No constant and/or focal abd pain. No dysuria. No flank pain. Denies headache. No chest pain or sob. No cough or uri symptoms. No fever or chills.  Feels similar as to when had dka in past.   The history is provided by the patient and medical records.  Abdominal Pain Associated symptoms: nausea and vomiting   Associated symptoms: no chest pain, no chills, no cough, no diarrhea, no dysuria, no fever, no shortness of breath and no sore throat   Hyperglycemia Associated symptoms: abdominal pain, increased thirst, nausea, polyuria and vomiting   Associated symptoms: no chest pain, no confusion, no diaphoresis, no dysuria, no fever and no shortness of breath   Patient      Prior to Admission medications   Medication Sig Start Date End Date Taking? Authorizing Provider  benztropine  (COGENTIN ) 0.5 MG tablet Take 1 tablet (0.5 mg total) by mouth 2 (two) times daily. For prevention of EPS Patient not taking: Reported on 02/19/2024 02/09/24   Collene Gouge I, NP  Continuous Glucose Sensor (DEXCOM G6 SENSOR) MISC Change site every 10 days. 03/20/24   Stephanie Freund, MD  haloperidol  decanoate (HALDOL  DECANOATE) 100 MG/ML injection Inject 1 mL (100 mg total) into the muscle every 30 (thirty) days. (Due on 03-09-24): For mood control Patient not taking: Reported on 02/19/2024 03/09/24   Collene Gouge I, NP  hydrOXYzine  (ATARAX ) 25 MG tablet Take 25 mg by mouth 3 (three) times daily as needed for anxiety.    [provider]  insulin  aspart (NOVOLOG  FLEXPEN) 100  UNIT/ML FlexPen Inject 7 Units into the skin 3 (three) times daily before meals. For diabetes control 03/20/24   Stephanie Freund, MD  insulin  glargine (LANTUS  SOLOSTAR) 100 UNIT/ML Solostar Pen Inject 30 Units into the skin daily. For diabetes control. 03/20/24   Stephanie Freund, MD  traZODone  (DESYREL ) 50 MG tablet Take 50 mg by mouth at bedtime.    [provider]  Vitamin D , Ergocalciferol , (DRISDOL ) 1.25 MG (50000 UNIT) CAPS capsule Take 1 capsule (50,000 Units total) by mouth every 7 (seven) days. Patient not taking: Reported on 02/19/2024 02/13/24   Collene Gouge I, NP    Allergies: Patient has no known allergies.    Review of Systems  Constitutional:  Negative for chills, diaphoresis and fever.  HENT:  Negative for sore throat.   Eyes:  Negative for visual disturbance.  Respiratory:  Negative for cough and shortness of breath.   Cardiovascular:  Negative for chest pain and leg swelling.  Gastrointestinal:  Positive for abdominal pain, nausea and vomiting. Negative for blood in stool and diarrhea.  Endocrine: Positive for polydipsia and polyuria.  Genitourinary:  Negative for dysuria and flank pain.  Musculoskeletal:  Negative for back pain and neck pain.  Skin:  Negative for rash.  Neurological:  Negative for headaches.  Psychiatric/Behavioral:  Negative for confusion.     Updated Vital Signs BP (!) 150/76   Pulse (!) 102   Temp 97.8 F (36.6 C)   Resp 20   Wt 62.3 kg  SpO2 100%   BMI 20.28 kg/m   Physical Exam Vitals and nursing note reviewed.  Constitutional:      Appearance: Normal appearance. He is well-developed.  HENT:     Head: Atraumatic.     Nose: Nose normal.     Mouth/Throat:     Mouth: Mucous membranes are moist.     Pharynx: Oropharynx is clear. No oropharyngeal exudate or posterior oropharyngeal erythema.  Eyes:     General: No scleral icterus.    Conjunctiva/sclera: Conjunctivae normal.     Pupils: Pupils are equal, round, and reactive to  light.  Neck:     Trachea: No tracheal deviation.     Comments: Trachea midline, thyroid  not grossly enlarged or tender. No neck stiffness or rigidity.  Cardiovascular:     Rate and Rhythm: Regular rhythm. Tachycardia present.     Pulses: Normal pulses.     Heart sounds: Normal heart sounds. No murmur heard.    No friction rub. No gallop.  Pulmonary:     Effort: Pulmonary effort is normal. No accessory muscle usage or respiratory distress.     Breath sounds: Normal breath sounds.  Abdominal:     General: Bowel sounds are normal. There is no distension.     Palpations: Abdomen is soft.     Tenderness: There is no abdominal tenderness. There is no guarding.  Genitourinary:    Comments: No cva tenderness. Musculoskeletal:        General: No swelling or tenderness.     Cervical back: Normal range of motion and neck supple. No rigidity.     Right lower leg: No edema.     Left lower leg: No edema.  Skin:    General: Skin is warm and dry.     Findings: No rash.  Neurological:     Mental Status: He is alert.     Comments: Alert, speech clear. Motor/sens grossly intact bil.   Psychiatric:        Mood and Affect: Mood normal.     (all labs ordered are listed, but only abnormal results are displayed) Results for orders placed or performed during the hospital encounter of 05/03/24  POC CBG, ED   Collection Time: 05/03/24  1:03 PM  Result Value Ref Range   Glucose-Capillary 491 (H) 70 - 99 mg/dL  CBC   Collection Time: 05/03/24  1:18 PM  Result Value Ref Range   WBC 24.8 (H) 4.0 - 10.5 K/uL   RBC 5.47 4.22 - 5.81 MIL/uL   Hemoglobin 16.0 13.0 - 17.0 g/dL   HCT 49.8 60.9 - 47.9 %   MCV 91.6 80.0 - 100.0 fL   MCH 29.3 26.0 - 34.0 pg   MCHC 31.9 30.0 - 36.0 g/dL   RDW 87.6 88.4 - 84.4 %   Platelets 503 (H) 150 - 400 K/uL   nRBC 0.0 0.0 - 0.2 %  Urinalysis, Routine w reflex microscopic -Urine, Clean Catch   Collection Time: 05/03/24  1:18 PM  Result Value Ref Range   Color,  Urine STRAW (A) YELLOW   APPearance CLEAR CLEAR   Specific Gravity, Urine 1.018 1.005 - 1.030   pH 5.0 5.0 - 8.0   Glucose, UA >=500 (A) NEGATIVE mg/dL   Hgb urine dipstick NEGATIVE NEGATIVE   Bilirubin Urine NEGATIVE NEGATIVE   Ketones, ur 80 (A) NEGATIVE mg/dL   Protein, ur 30 (A) NEGATIVE mg/dL   Nitrite NEGATIVE NEGATIVE   Leukocytes,Ua NEGATIVE NEGATIVE   RBC / HPF 0-5  0 - 5 RBC/hpf   WBC, UA 0-5 0 - 5 WBC/hpf   Bacteria, UA RARE (A) NONE SEEN   Squamous Epithelial / HPF 0-5 0 - 5 /HPF   Mucus PRESENT    Hyaline Casts, UA PRESENT    Granular Casts, UA PRESENT   Comprehensive metabolic panel   Collection Time: 05/03/24  1:18 PM  Result Value Ref Range   Sodium 136 135 - 145 mmol/L   Potassium 6.3 (HH) 3.5 - 5.1 mmol/L   Chloride 99 98 - 111 mmol/L   CO2 <7 (L) 22 - 32 mmol/L   Glucose, Bld 567 (HH) 70 - 99 mg/dL   BUN 21 (H) 6 - 20 mg/dL   Creatinine, Ser 8.11 (H) 0.61 - 1.24 mg/dL   Calcium  9.8 8.9 - 10.3 mg/dL   Total Protein 8.5 (H) 6.5 - 8.1 g/dL   Albumin 5.2 (H) 3.5 - 5.0 g/dL   AST 38 15 - 41 U/L   ALT 40 0 - 44 U/L   Alkaline Phosphatase 90 38 - 126 U/L   Total Bilirubin 1.8 (H) 0.0 - 1.2 mg/dL   GFR, Estimated 48 (L) >60 mL/min   Anion gap NOT CALCULATED 5 - 15  Beta-hydroxybutyric acid   Collection Time: 05/03/24  1:27 PM  Result Value Ref Range   Beta-Hydroxybutyric Acid 7.50 (H) 0.05 - 0.27 mmol/L  I-stat chem 8, ed   Collection Time: 05/03/24  1:33 PM  Result Value Ref Range   Sodium 132 (L) 135 - 145 mmol/L   Potassium 6.0 (H) 3.5 - 5.1 mmol/L   Chloride 107 98 - 111 mmol/L   BUN 25 (H) 6 - 20 mg/dL   Creatinine, Ser 8.79 0.61 - 1.24 mg/dL   Glucose, Bld 432 (HH) 70 - 99 mg/dL   Calcium , Ion 1.19 1.15 - 1.40 mmol/L   TCO2 8 (L) 22 - 32 mmol/L   Hemoglobin 17.7 (H) 13.0 - 17.0 g/dL   HCT 47.9 60.9 - 47.9 %   Comment NOTIFIED PHYSICIAN   I-Stat venous blood gas, ([facility] ED, [facility], [facility])   Collection Time: 05/03/24  1:36 PM  Result Value Ref Range    pH, Ven 7.051 (LL) 7.25 - 7.43   pCO2, Ven 18.9 (LL) 44 - 60 mmHg   pO2, Ven 91 (H) 32 - 45 mmHg   Bicarbonate 5.2 (L) 20.0 - 28.0 mmol/L   TCO2 6 (L) 22 - 32 mmol/L   O2 Saturation 93 %   Acid-base deficit 23.0 (H) 0.0 - 2.0 mmol/L   Sodium 131 (L) 135 - 145 mmol/L   Potassium 5.9 (H) 3.5 - 5.1 mmol/L   Calcium , Ion 1.19 1.15 - 1.40 mmol/L   HCT 51.0 39.0 - 52.0 %   Hemoglobin 17.3 (H) 13.0 - 17.0 g/dL   Sample type VENOUS    Comment NOTIFIED PHYSICIAN   CBG monitoring, ED   Collection Time: 05/03/24  2:53 PM  Result Value Ref Range   Glucose-Capillary 550 (HH) 70 - 99 mg/dL     EKG: EKG Interpretation Date/Time:  Thursday May 03 2024 15:08:50 EDT Ventricular Rate:  100 PR Interval:  111 QRS Duration:  103 QT Interval:  342 QTC Calculation: 442 R Axis:   94  Text Interpretation: Sinus tachycardia Left posterior fascicular block ST elev, probable normal early repol pattern peaked t-waves Confirmed by Yolande Charleston [phone]) on 05/03/2024 3:09:52 PM  Radiology: No results found.   Procedures   Medications Ordered in the ED  insulin   regular, human (MYXREDLIN ) 100 units/ 100 mL infusion (6.5 Units/hr Intravenous New Bag/Given 05/03/24 1504)  lactated ringers  infusion (has no administration in time range)  dextrose  5 % in lactated ringers  infusion ( Intravenous Not Given 05/03/24 1444)  dextrose  50 % solution 0-50 mL (has no administration in time range)  calcium  gluconate inj 10% (1 g) URGENT USE ONLY! (has no administration in time range)  sodium bicarbonate  injection 50 mEq (has no administration in time range)  lactated ringers  bolus 1,000 mL (1,000 mLs Intravenous New Bag/Given 05/03/24 1328)  ondansetron  (ZOFRAN ) injection 4 mg (4 mg Intravenous Given 05/03/24 1423)  lactated ringers  bolus 1,246 mL (1,246 mLs Intravenous New Bag/Given 05/03/24 1456)    Clinical Course as of 05/03/24 1528  Thu May 03, 2024  1338 VBG shows pH at 7.051. DKA. [OZ]    Clinical Course User  Index [OZ] Cecily Legrand LABOR, [name]                                 Medical Decision Making Problems Addressed: Dehydration: acute illness or injury with systemic symptoms that poses a threat to life or bodily functions Diabetic ketoacidosis without coma associated with diabetes mellitus due to underlying condition [facility]): acute illness or injury with systemic symptoms that poses a threat to life or bodily functions Hyperglycemia: acute illness or injury with systemic symptoms that poses a threat to life or bodily functions Hyperkalemia: acute illness or injury with systemic symptoms that poses a threat to life or bodily functions Non compliance w medication regimen: acute illness or injury    Details: Acute/chronic  Amount and/or Complexity of Data Reviewed External Data Reviewed: notes. Labs: ordered. Decision-making details documented in ED Course. ECG/medicine tests: ordered and independent interpretation performed. Decision-making details documented in ED Course. Discussion of management or test interpretation with external provider(s): medicine  Risk Prescription drug management. Decision regarding hospitalization.   Iv ns. Continuous pulse ox and cardiac monitoring. Labs ordered/sent.   Differential diagnosis includes dka, dehydration, gi illness, etc. Dispo decision including potential need for admission considered - will get labs and reassess.   Reviewed nursing notes and prior charts for additional history. External reports reviewed.  Cardiac monitor: sinus rhythm, rate 102.  LR bolus. Insulin  gtt via hyperglycemic crisis order.   Labs reviewed/interpreted by me - glucose v high, hco3 low. K high - should improved w ivf, insulin  gtt/treatment of dka, and will give dose ca gluconate now.  Additional ivf. Wbc elev, hgb 16. Ua without uti.   Additional fluids. Recheck abd  soft non tender.   Medicine consulted for admission. Discussed pt, labs - will admit.   CRITICAL CARE  RE: DKA, severe dehydration, severe hyperglycemia, metabolic acidosis.  Performed by: Kalesha Irving E Kinslie Hove Total critical care time: 80 minutes Critical care time was exclusive of separately billable procedures and treating other patients. Critical care was necessary to treat or prevent imminent or life-threatening deterioration. Critical care was time spent personally by me on the following activities: development of treatment plan with patient and/or surrogate as well as nursing, discussions with consultants, evaluation of patient's response to treatment, examination of patient, obtaining history from patient or surrogate, ordering and performing treatments and interventions, ordering and review of laboratory studies, ordering and review of radiographic studies, pulse oximetry and re-evaluation of patient's condition.       Final diagnoses:  Diabetic ketoacidosis without coma associated with diabetes mellitus due to underlying  condition (HCC)  Hyperglycemia  Dehydration  Hyperkalemia  Non compliance w medication regimen    ED Discharge Orders     None           Bernard Drivers, MD 05/03/24 1529

## 2024-05-03 NOTE — ED Provider Triage Note (Signed)
 Emergency Medicine Provider Triage Evaluation Note  Jacob [name] , a 33 y.o. male  was evaluated in triage.  Pt complains of abdominal pain, nausea, vomiting.  Patient is a type I diabetic with poorly controlled glucose levels.  He is homeless.  States that he did not take his insulin  yesterday due to the temperature being too hot.  Endorsing nausea, persistent vomiting.  Not tolerating p.o..  Review of Systems  Positive: As above Negative: As above  Physical Exam  BP (!) 148/87 (BP Location: Left Arm)   Pulse (!) 114   Temp 97.8 F (36.6 C)   Resp (!) 26   SpO2 99%  Gen:   Awake, fatigued Resp:  Normal effort  MSK:   Moves extremities without difficulty  Other:  No focal abdominal tenderness  Medical Decision Making  Medically screening exam initiated at 1:22 PM.  Appropriate orders placed.  Jacob [name] was informed that the remainder of the evaluation will be completed by another provider, this initial triage assessment does not replace that evaluation, and the importance of remaining in the ED until their evaluation is complete.  Suspicion for DKA. Corresponding labs added on.   Jacob [name] A, [name] 05/03/24 1323

## 2024-05-03 NOTE — ED Notes (Signed)
 Insulin  dripped stopped per endo tool

## 2024-05-03 NOTE — ED Notes (Signed)
 Abnormal glucose reported to Jacob [name] by at

## 2024-05-03 NOTE — ED Notes (Signed)
 Abnormal vbg results given to ariel z.pa by at

## 2024-05-04 DIAGNOSIS — E875 Hyperkalemia: Secondary | ICD-10-CM | POA: Diagnosis present

## 2024-05-04 DIAGNOSIS — N179 Acute kidney failure, unspecified: Secondary | ICD-10-CM | POA: Diagnosis present

## 2024-05-04 DIAGNOSIS — Z560 Unemployment, unspecified: Secondary | ICD-10-CM | POA: Diagnosis not present

## 2024-05-04 DIAGNOSIS — R000 Tachycardia, unspecified: Secondary | ICD-10-CM | POA: Diagnosis present

## 2024-05-04 DIAGNOSIS — Z79899 Other long term (current) drug therapy: Secondary | ICD-10-CM | POA: Diagnosis not present

## 2024-05-04 DIAGNOSIS — Z91148 Patient's other noncompliance with medication regimen for other reason: Secondary | ICD-10-CM | POA: Diagnosis not present

## 2024-05-04 DIAGNOSIS — D72829 Elevated white blood cell count, unspecified: Secondary | ICD-10-CM | POA: Diagnosis present

## 2024-05-04 DIAGNOSIS — F259 Schizoaffective disorder, unspecified: Secondary | ICD-10-CM | POA: Diagnosis present

## 2024-05-04 DIAGNOSIS — Z5900 Homelessness unspecified: Secondary | ICD-10-CM | POA: Diagnosis not present

## 2024-05-04 DIAGNOSIS — E1065 Type 1 diabetes mellitus with hyperglycemia: Secondary | ICD-10-CM | POA: Diagnosis not present

## 2024-05-04 DIAGNOSIS — E860 Dehydration: Secondary | ICD-10-CM | POA: Diagnosis present

## 2024-05-04 DIAGNOSIS — F17210 Nicotine dependence, cigarettes, uncomplicated: Secondary | ICD-10-CM | POA: Diagnosis present

## 2024-05-04 DIAGNOSIS — E1010 Type 1 diabetes mellitus with ketoacidosis without coma: Secondary | ICD-10-CM | POA: Diagnosis not present

## 2024-05-04 LAB — GLUCOSE, CAPILLARY
Glucose-Capillary: 210 mg/dL — ABNORMAL HIGH (ref 70–99)
Glucose-Capillary: 153 mg/dL — ABNORMAL HIGH (ref 70–99)
Glucose-Capillary: 167 mg/dL — ABNORMAL HIGH (ref 70–99)
Glucose-Capillary: 174 mg/dL — ABNORMAL HIGH (ref 70–99)
Glucose-Capillary: 174 mg/dL — ABNORMAL HIGH (ref 70–99)
Glucose-Capillary: 171 mg/dL — ABNORMAL HIGH (ref 70–99)
Glucose-Capillary: 156 mg/dL — ABNORMAL HIGH (ref 70–99)
Glucose-Capillary: 168 mg/dL — ABNORMAL HIGH (ref 70–99)
Glucose-Capillary: 165 mg/dL — ABNORMAL HIGH (ref 70–99)
Glucose-Capillary: 167 mg/dL — ABNORMAL HIGH (ref 70–99)
Glucose-Capillary: 174 mg/dL — ABNORMAL HIGH (ref 70–99)
Glucose-Capillary: 196 mg/dL — ABNORMAL HIGH (ref 70–99)
Glucose-Capillary: 367 mg/dL — ABNORMAL HIGH (ref 70–99)
Glucose-Capillary: 419 mg/dL — ABNORMAL HIGH (ref 70–99)
Glucose-Capillary: 424 mg/dL — ABNORMAL HIGH (ref 70–99)
Glucose-Capillary: 320 mg/dL — ABNORMAL HIGH (ref 70–99)
Glucose-Capillary: 245 mg/dL — ABNORMAL HIGH (ref 70–99)
Glucose-Capillary: 391 mg/dL — ABNORMAL HIGH (ref 70–99)

## 2024-05-04 LAB — CBC
HCT: 35.3 % — ABNORMAL LOW (ref 39.0–52.0)
Hemoglobin: 12.1 g/dL — ABNORMAL LOW (ref 13.0–17.0)
MCH: 29.7 pg (ref 26.0–34.0)
MCHC: 34.3 g/dL (ref 30.0–36.0)
MCV: 86.7 fL (ref 80.0–100.0)
Platelets: 322 K/uL (ref 150–400)
RBC: 4.07 MIL/uL — ABNORMAL LOW (ref 4.22–5.81)
RDW: 12.8 % (ref 11.5–15.5)
WBC: 12.1 K/uL — ABNORMAL HIGH (ref 4.0–10.5)
nRBC: 0 % (ref 0.0–0.2)

## 2024-05-04 LAB — BASIC METABOLIC PANEL WITH GFR
Anion gap: 14 (ref 5–15)
Anion gap: 11 (ref 5–15)
BUN: 11 mg/dL (ref 6–20)
BUN: 9 mg/dL (ref 6–20)
CO2: 16 mmol/L — ABNORMAL LOW (ref 22–32)
CO2: 21 mmol/L — ABNORMAL LOW (ref 22–32)
Calcium: 9.5 mg/dL (ref 8.9–10.3)
Calcium: 9.2 mg/dL (ref 8.9–10.3)
Chloride: 107 mmol/L (ref 98–111)
Chloride: 106 mmol/L (ref 98–111)
Creatinine, Ser: 1.17 mg/dL (ref 0.61–1.24)
Creatinine, Ser: 1.03 mg/dL (ref 0.61–1.24)
GFR, Estimated: >60 mL/min (ref >60)
GFR, Estimated: >60 mL/min (ref >60)
Glucose, Bld: 172 mg/dL — ABNORMAL HIGH (ref 70–99)
Glucose, Bld: 623 mg/dL (ref 70–99)
Potassium: 4 mmol/L (ref 3.5–5.1)
Potassium: 3.8 mmol/L (ref 3.5–5.1)
Sodium: 137 mmol/L (ref 135–145)
Sodium: 138 mmol/L (ref 135–145)

## 2024-05-04 MED ORDER — INSULIN ASPART 100 UNIT/ML IJ SOLN
9.0000 [IU] | Freq: Once | INTRAMUSCULAR | Status: AC
  Administered 2024-05-04: 9 [IU] via SUBCUTANEOUS

## 2024-05-04 MED ORDER — INSULIN ASPART 100 UNIT/ML IJ SOLN
0.0000 [IU] | Freq: Every day | INTRAMUSCULAR | Status: DC
  Administered 2024-05-04: 5 [IU] via SUBCUTANEOUS

## 2024-05-04 MED ORDER — INSULIN GLARGINE-YFGN 100 UNIT/ML ~~LOC~~ SOLN
15.0000 [IU] | Freq: Every day | SUBCUTANEOUS | Status: DC
  Administered 2024-05-04: 15 [IU] via SUBCUTANEOUS
  Filled 2024-05-04: qty 0.15

## 2024-05-04 MED ORDER — INSULIN GLARGINE-YFGN 100 UNIT/ML ~~LOC~~ SOLN
15.0000 [IU] | Freq: Every day | SUBCUTANEOUS | Status: DC
Start: 2024-05-05 — End: 2024-05-04

## 2024-05-04 MED ORDER — INSULIN ASPART 100 UNIT/ML IJ SOLN
0.0000 [IU] | Freq: Three times a day (TID) | INTRAMUSCULAR | Status: DC
  Administered 2024-05-04: 3 [IU] via SUBCUTANEOUS
  Administered 2024-05-05: 9 [IU] via SUBCUTANEOUS
  Administered 2024-05-05: 5 [IU] via SUBCUTANEOUS

## 2024-05-04 MED ORDER — INSULIN GLARGINE-YFGN 100 UNIT/ML ~~LOC~~ SOLN
30.0000 [IU] | Freq: Every day | SUBCUTANEOUS | Status: DC
  Filled 2024-05-04: qty 0.3

## 2024-05-04 MED ORDER — POTASSIUM CHLORIDE CRYS ER 20 MEQ PO TBCR
40.0000 meq | EXTENDED_RELEASE_TABLET | Freq: Once | ORAL | Status: AC
  Administered 2024-05-04: 40 meq via ORAL
  Filled 2024-05-04: qty 2

## 2024-05-04 MED ORDER — INSULIN GLARGINE-YFGN 100 UNIT/ML ~~LOC~~ SOLN
15.0000 [IU] | Freq: Once | SUBCUTANEOUS | Status: AC
  Administered 2024-05-04: 15 [IU] via SUBCUTANEOUS
  Filled 2024-05-04: qty 0.15

## 2024-05-04 NOTE — Plan of Care (Signed)
   Problem: Activity: Goal: Risk for activity intolerance will decrease Outcome: Progressing

## 2024-05-04 NOTE — Progress Notes (Signed)
 HD#0 SUBJECTIVE:  Patient Summary: Jacob [name] is a 33 y.o. with a pertinent PMH of uncontrolled Type 1 DM on long term insulin , un housed status, schizoaffective disorder, tobacco use disorder, and recurrent hospital admissions for DKA. He presented to the ED for nausea and vomiting. Last administration of long acting insulin  was 2 weeks prior to ED.   Overnight Events and Interim History: Patient was continued on endotool. Otherwise no overnight events. Anion gap closed twice, however patient continues to be asymptomatic hyperglycemic, continuing on IM insulin  .  Patient denied Nausea, vomiting, or pain at bedside. Patient reported that he does not take his insulin  because his current housing roommate at the shelter keeps the room too hot and he feels too uncomfortable and hot to take his insulin . He mentioned that the insulin  itself does not make him feel ill, he said it is just too hot to take it. Patient was counseled on the importance of taking insulin .    Patient's stated last Haldol  administration was last month. Next due on the 11th. Spoke about potentially getting dose before discharge.   OBJECTIVE:  Vital Signs: Vitals:   05/03/24 1954 05/03/24 2256 05/04/24 0315 05/04/24 1149  BP:  118/67 (!) 133/91 (!) 138/91  Pulse:  83 78 88  Resp:  20 20 15   Temp: 99.5 F (37.5 C) 99.8 F (37.7 C) 98.3 F (36.8 C) 98.5 F (36.9 C)  TempSrc: Oral Oral Oral Oral  SpO2:  97% 100% 97%  Weight:       Supplemental O2: Room Air SpO2: 97 %  Filed Weights   05/03/24 1400  Weight: 62.3 kg     Intake/Output Summary (Last 24 hours) at 05/04/2024 1510 Last data filed at 05/04/2024 0104 Gross per 24 hour  Intake 2809.95 ml  Output --  Net 2809.95 ml   Net IO Since Admission: 2,809.95 mL [05/04/24 1510]  Physical Exam: Physical Exam Cardiovascular:     Rate and Rhythm: Normal rate and regular rhythm.  Pulmonary:     Effort: Pulmonary effort is normal.     Breath sounds: Normal  breath sounds.  Abdominal:     General: Abdomen is flat. Bowel sounds are normal.     Palpations: Abdomen is soft.  Skin:    General: Skin is warm and dry.  Neurological:     Mental Status: He is alert.     Patient Lines/Drains/Airways Status     Active Line/Drains/Airways     Name Placement date Placement time Site Days   Peripheral IV 05/03/24 20 G Left Antecubital 05/03/24  1324  Antecubital  1   Peripheral IV 05/03/24 20 G Anterior;Right Forearm 05/03/24  1458  Forearm  1            Pertinent labs and imaging:   BMP with 623 Glucose at 8:34  Capillary glucose was 167 at 9:09      Latest Ref Rng & Units 05/04/2024    8:34 AM 05/03/2024    5:25 PM 05/03/2024    1:36 PM  CBC  WBC 4.0 - 10.5 K/uL 12.1  26.5    Hemoglobin 13.0 - 17.0 g/dL 87.8  84.2  82.6   Hematocrit 39.0 - 52.0 % 35.3  47.5  51.0   Platelets 150 - 400 K/uL 322  419         Latest Ref Rng & Units 05/04/2024    8:34 AM 05/04/2024    3:07 AM 05/03/2024   10:47 PM  CMP  Glucose 70 - 99 mg/dL 376  827  738   BUN 6 - 20 mg/dL 9  11  12    Creatinine 0.61 - 1.24 mg/dL 8.96  8.82  8.61   Sodium 135 - 145 mmol/L 138  137  136   Potassium 3.5 - 5.1 mmol/L 3.8  4.0  4.9   Chloride 98 - 111 mmol/L 106  107  104   CO2 22 - 32 mmol/L 21  16  12    Calcium  8.9 - 10.3 mg/dL 9.2  9.5  9.5     No results found.  ASSESSMENT/PLAN:  Assessment: Principal Problem:   Diabetic ketoacidosis (HCC) Active Problems:   Uncontrolled type 1 diabetes mellitus with hyperglycemia, with long-term current use of insulin  (HCC)   AKI (acute kidney injury) (HCC)   Homelessness   Tobacco abuse   Hyperkalemia   Nonadherence to medication   Leukocytosis   Plan: #DKA Uncontrolled Type 1 DM Medication non adherence Nausea and vomiting -Patient counseled on importance of insulin  administration to prevent readmission to hospital for future DKA -patient denies N/V -transitioned off endotool to long acting insulin    -Given 24U  total insulin  glargine, on sensitive sliding scale.  -still hyperglycemic even though AG closed twice    #Hyperkalemia - Resolved   -cellular shifts in setting of DKA. Peaked T waves on admission. Given calcium  gluconate x1 -monitoring   #Schizoaffective -Patient's stated last Haldol  administration was last month. Next due on the 11th. Spoke about potentially getting dose before discharge.  -continuing Trazodone  50 mg daily  -continuing benztropine  0.5 mg BID   #AKI - resolved  -Cr 1.53-->1.38-->1.17-->1.03  #Leukocytosis- resolving  -WBW 26.5-->12.1   Best Practice: Diet: Diabetic diet VTE: enoxaparin  (LOVENOX ) injection 40 mg Start: 05/03/24 1615 SCDs Start: 05/03/24 1614 Code: Full   Signature:  Sallyanne Primas, DO [name]  3:10 PM, 05/04/2024  On Call pager [phone]

## 2024-05-04 NOTE — Progress Notes (Signed)
 Mobility Specialist Progress Note:    05/04/24 1043  Mobility  Activity Ambulated independently to bathroom  Level of Assistance Independent after set-up  Assistive Device None  Distance Ambulated (ft) 15 ft  Activity Response Tolerated well  Mobility Referral Yes  Mobility visit 1 Mobility  Mobility Specialist Start Time (ACUTE ONLY) 1035  Mobility Specialist Stop Time (ACUTE ONLY) 1041  Mobility Specialist Time Calculation (min) (ACUTE ONLY) 6 min   Responded to pt's bed alarm, pt requesting to use bathroom. SV for safety, independent after set-up. Tolerated well, asx throughout. Left with all needs met, encouraged to use call bell for assistance.   Jessica Seidman Mobility Specialist Please contact via Special educational needs teacher or  Rehab office at [phone]

## 2024-05-05 DIAGNOSIS — Z91148 Patient's other noncompliance with medication regimen for other reason: Secondary | ICD-10-CM

## 2024-05-05 DIAGNOSIS — F259 Schizoaffective disorder, unspecified: Secondary | ICD-10-CM

## 2024-05-05 LAB — CBC
HCT: 36.5 % — ABNORMAL LOW (ref 39.0–52.0)
Hemoglobin: 13.1 g/dL (ref 13.0–17.0)
MCH: 30.3 pg (ref 26.0–34.0)
MCHC: 35.9 g/dL (ref 30.0–36.0)
MCV: 84.3 fL (ref 80.0–100.0)
Platelets: 322 K/uL (ref 150–400)
RBC: 4.33 MIL/uL (ref 4.22–5.81)
RDW: 12.7 % (ref 11.5–15.5)
WBC: 8.8 K/uL (ref 4.0–10.5)
nRBC: 0 % (ref 0.0–0.2)

## 2024-05-05 LAB — BASIC METABOLIC PANEL WITH GFR
Anion gap: 10 (ref 5–15)
BUN: 17 mg/dL (ref 6–20)
CO2: 25 mmol/L (ref 22–32)
Calcium: 9.5 mg/dL (ref 8.9–10.3)
Chloride: 100 mmol/L (ref 98–111)
Creatinine, Ser: 0.93 mg/dL (ref 0.61–1.24)
GFR, Estimated: >60 mL/min (ref >60)
Glucose, Bld: 238 mg/dL — ABNORMAL HIGH (ref 70–99)
Potassium: 3.7 mmol/L (ref 3.5–5.1)
Sodium: 135 mmol/L (ref 135–145)

## 2024-05-05 LAB — GLUCOSE, CAPILLARY
Glucose-Capillary: 362 mg/dL — ABNORMAL HIGH (ref 70–99)
Glucose-Capillary: 263 mg/dL — ABNORMAL HIGH (ref 70–99)

## 2024-05-05 MED ORDER — INSULIN ASPART 100 UNIT/ML IJ SOLN
2.0000 [IU] | Freq: Three times a day (TID) | INTRAMUSCULAR | Status: DC
  Administered 2024-05-05 (×2): 2 [IU] via SUBCUTANEOUS

## 2024-05-05 MED ORDER — INSULIN GLARGINE-YFGN 100 UNIT/ML ~~LOC~~ SOLN
40.0000 [IU] | Freq: Every day | SUBCUTANEOUS | Status: DC
  Administered 2024-05-05: 40 [IU] via SUBCUTANEOUS
  Filled 2024-05-05: qty 0.4

## 2024-05-05 NOTE — Plan of Care (Signed)
  Problem: Education: Goal: Knowledge of General Education information will improve Description: Including pain rating scale, medication(s)/side effects and non-pharmacologic comfort measures Outcome: Progressing   Problem: Health Behavior/Discharge Planning: Goal: Ability to manage health-related needs will improve Outcome: Progressing   Problem: Clinical Measurements: Goal: Ability to maintain clinical measurements within normal limits will improve Outcome: Progressing Goal: Will remain free from infection Outcome: Progressing Goal: Diagnostic test results will improve Outcome: Progressing Goal: Respiratory complications will improve Outcome: Progressing Goal: Cardiovascular complication will be avoided Outcome: Progressing   Problem: Activity: Goal: Risk for activity intolerance will decrease Outcome: Progressing   Problem: Nutrition: Goal: Adequate nutrition will be maintained Outcome: Progressing   Problem: Coping: Goal: Level of anxiety will decrease Outcome: Progressing   Problem: Elimination: Goal: Will not experience complications related to bowel motility Outcome: Progressing Goal: Will not experience complications related to urinary retention Outcome: Progressing   Problem: Pain Managment: Goal: General experience of comfort will improve and/or be controlled Outcome: Progressing   Problem: Safety: Goal: Ability to remain free from injury will improve Outcome: Progressing   Problem: Skin Integrity: Goal: Risk for impaired skin integrity will decrease Outcome: Progressing   Problem: Education: Goal: Knowledge of General Education information will improve Description: Including pain rating scale, medication(s)/side effects and non-pharmacologic comfort measures Outcome: Progressing   Problem: Health Behavior/Discharge Planning: Goal: Ability to manage health-related needs will improve Outcome: Progressing   Problem: Clinical Measurements: Goal:  Ability to maintain clinical measurements within normal limits will improve Outcome: Progressing Goal: Will remain free from infection Outcome: Progressing Goal: Diagnostic test results will improve Outcome: Progressing Goal: Respiratory complications will improve Outcome: Progressing Goal: Cardiovascular complication will be avoided Outcome: Progressing   Problem: Activity: Goal: Risk for activity intolerance will decrease Outcome: Progressing   Problem: Nutrition: Goal: Adequate nutrition will be maintained Outcome: Progressing   Problem: Coping: Goal: Level of anxiety will decrease Outcome: Progressing   Problem: Elimination: Goal: Will not experience complications related to bowel motility Outcome: Progressing Goal: Will not experience complications related to urinary retention Outcome: Progressing   Problem: Pain Managment: Goal: General experience of comfort will improve and/or be controlled Outcome: Progressing   Problem: Safety: Goal: Ability to remain free from injury will improve Outcome: Progressing   Problem: Skin Integrity: Goal: Risk for impaired skin integrity will decrease Outcome: Progressing   Problem: Education: Goal: Ability to describe self-care measures that may prevent or decrease complications (Diabetes Survival Skills Education) will improve Outcome: Progressing Goal: Individualized Educational Video(s) Outcome: Progressing   Problem: Coping: Goal: Ability to adjust to condition or change in health will improve Outcome: Progressing   Problem: Fluid Volume: Goal: Ability to maintain a balanced intake and output will improve Outcome: Progressing   Problem: Health Behavior/Discharge Planning: Goal: Ability to identify and utilize available resources and services will improve Outcome: Progressing Goal: Ability to manage health-related needs will improve Outcome: Progressing   Problem: Metabolic: Goal: Ability to maintain appropriate  glucose levels will improve Outcome: Progressing   Problem: Nutritional: Goal: Maintenance of adequate nutrition will improve Outcome: Progressing Goal: Progress toward achieving an optimal weight will improve Outcome: Progressing   Problem: Skin Integrity: Goal: Risk for impaired skin integrity will decrease Outcome: Progressing   Problem: Tissue Perfusion: Goal: Adequacy of tissue perfusion will improve Outcome: Progressing

## 2024-05-05 NOTE — Hospital Course (Signed)
 Problem List  Principal Problem:   Diabetic ketoacidosis (HCC) Active Problems:   Uncontrolled type 1 diabetes mellitus with hyperglycemia, with long-term current use of insulin  (HCC)   AKI (acute kidney injury) (HCC)   Homelessness   Tobacco abuse   Hyperkalemia   Nonadherence to medication   Leukocytosis  Resolved Conditions:  DKA -Patient presented in DKA to the ED and was placed on EndoTool. Anion gap closed twice, however the patient continued to have asymptomatic hyperglycemia. Current regimen for him is 40 U of long acting insulin  and 2 U TID of short acting insulin .   Hyperkalemia  -cellular shift in setting of DKA. EKG showed peaked T waves on admission -administered calcium  gluconate  -Potassium WNL at discharge   AKI -patient presented with AKI on admission, resolved with fluid administration to baseline Cr  Leukocytosis:  -Leucocytosis on admission -WBC WNL  Chronic Conditions:  Uncontrolled Type 1 DM -patient was counseled on the importance of insulin  administration to prevent readmission to hospital and death  Schizoaffective  -patient denied SI/HI/SH on admission. Last Haldol  administration was last month.  -currently on Trazodone  50 mg daily and Benztropine  0.5 mg BID  _______________________________   Thank you for allowing us  to be part of your care. You were hospitalized for DKA. We treated you with fluids, insulin , and monitoring electrolytes and blood glucose.   See the changes in your medications and management of your chronic conditions below:  *For your Uncontrolled Type 1 Diabetes   We have CONTINUED you on these following medications:          - Insulin  Glargine (long acting insulin ) 30 Units ONCE A DAY.           - Insulin  Aspart (short acting insulin ) 7 Units THREE TIMES A DAY before meals.  You will not need to take your insulin  glargine 05/05/2024 (day of discharge) as you have already received it.   We ENCOURAGE you to: Take your  insulin  as instructed. As we discussed with you in the hospital, if you do not take your insulin  you risk readmission for DKA or even death.   Check your blood sugar with a glucometer at least daily (in the morning)  *For your Schizoaffective Disorder -We have CONTINUED you on the following medications             - haloperidol  deconoate (Haldol  deconate) 100 mg/ml injection  -Hydroxyzine  25 mg tablet, take three times by mouth daily as needed for anxiety              - Trazodone  50 mg by mouth at bedtime    FOLLOW UP APPOINTMENTS: We arranged for you to follow up with the internal medicine residency clinic in 7 days.  You will be contacted by our clinic. If you do not hear from us  by Wednesday, please call the number below.   Please make sure to follow up with [facility] to receive your monthly haloperidol  shot.   Please call your PCP or our clinic if you have any questions or concerns, we may be able to help and keep you from a long and expensive emergency room wait. Our clinic and after hours phone number is [phone]. The best time to call is Monday through Friday 9 am to 4 pm but there is always someone available 24/7 if you have an emergency. If you need medication refills please notify your pharmacy one week in advance and they will send us  a request.   We are glad  you are feeling better,  Sallyanne Primas, DO Internal Medicine Inpatient Teaching Service at [facility]

## 2024-05-05 NOTE — Discharge Summary (Signed)
 Name: Jacob [name] MRN: [id number] DOB: [birth year] 33 y.o. PCP: Napoleon Limes, MD  Date of Admission: 05/03/2024  1:07 PM Date of Discharge: May 05, 2024  Attending Physician: Dr. MICAEL Riis Winfrey  Discharge Diagnosis: 1. Principal Problem:   Diabetic ketoacidosis (HCC) Active Problems:   Uncontrolled type 1 diabetes mellitus with hyperglycemia, with long-term current use of insulin  (HCC)   AKI (acute kidney injury) (HCC)   Homelessness   Tobacco abuse   DKA (diabetic ketoacidosis) (HCC)   Hyperkalemia   Nonadherence to medication   Leukocytosis   Non compliance w medication regimen   Discharge Medications: Allergies as of 05/05/2024   No Known Allergies      Medication List     TAKE these medications    benztropine  0.5 MG tablet Commonly known as: COGENTIN  Take 1 tablet (0.5 mg total) by mouth 2 (two) times daily. For prevention of EPS   Dexcom G6 Sensor Misc Change site every 10 days.   haloperidol  decanoate 100 MG/ML injection Commonly known as: HALDOL  DECANOATE Inject 1 mL (100 mg total) into the muscle every 30 (thirty) days. ([name] on 03-09-24): For mood control   hydrOXYzine  25 MG tablet Commonly known as: ATARAX  Take 25 mg by mouth 3 (three) times daily as needed for anxiety.   Lantus  SoloStar 100 UNIT/ML Solostar Pen Generic drug: insulin  glargine Inject 30 Units into the skin daily. For diabetes control.   NovoLOG  FlexPen 100 UNIT/ML FlexPen Generic drug: insulin  aspart Inject 7 Units into the skin 3 (three) times daily before meals. For diabetes control   traZODone  50 MG tablet Commonly known as: DESYREL  Take 50 mg by mouth at bedtime.   Vitamin D  (Ergocalciferol ) 1.25 MG (50000 UNIT) Caps capsule Commonly known as: DRISDOL  Take 1 capsule (50,000 Units total) by mouth every 7 (seven) days.        Disposition and follow-up:   Jacob [name] was [facility] from [facility] in Good condition.  At the hospital follow  up visit please address:  Uncontrolled Type 1 DM patient was counseled on the importance of insulin  administration to prevent readmission to hospital and death Assess compliance  Schizoaffective  Assess compliance with current medications: Trazodone  50 mg daily, Benztropine  0.5 mg BID, and once monthly haldol    2.  Labs / imaging needed at time of follow-up: Blood glucose, RFP,   3.  Pending labs/ test needing follow-up: N/A  Follow-up Appointments: Internal Medicine Residency Clinic: 1 week after discharge: [address] [employer] [address] [employer] [address], [place], [place] [address]   Hospital Course by problem list: Jacob [name] is a 33 y.o. person living with a history of uncontrolled type 1 DM on long term insulin , un-housed status, schizoaffective disorder, tobacco use disorder, and recurrent hospital admissions for DKA who presented with N/V and admitted for DKA now being discharged on hospital day 1 with the following pertinent hospital course:  Problem List  Principal Problem:   Diabetic ketoacidosis (HCC) Active Problems:   Uncontrolled type 1 diabetes mellitus with hyperglycemia, with long-term current use of insulin  (HCC)   AKI (acute kidney injury) (HCC)   Homelessness   Tobacco abuse   Hyperkalemia   Nonadherence to medication   Leukocytosis  Resolved Conditions:  DKA -Patient presented in DKA to the ED and was placed on EndoTool. Anion gap closed twice, however the patient continued to have asymptomatic hyperglycemia. Current regimen for him is 40 U of long acting insulin  and 2 U TID of short acting insulin .  Hyperkalemia  -cellular shift in setting of DKA. EKG showed peaked T waves on admission -administered calcium  gluconate  -Potassium WNL at discharge   AKI -patient presented with AKI on admission, resolved with fluid administration to baseline Cr  Leukocytosis:  -Leucocytosis on admission -WBC WNL  Chronic Conditions:  Uncontrolled Type 1 DM -patient was counseled on the  importance of insulin  administration to prevent readmission to hospital and death  Schizoaffective  -patient denied SI/HI/SH on admission. Last Haldol  administration was last month.  -currently on Trazodone  50 mg daily and Benztropine  0.5 mg BID  Stable chronic medical conditions: Schizoaffective - continuing haldol , trazodone , benztropine   Un-housed status - currently able to go to mom for support and transportation. Currently at shelter    Discharge Exam:   BP 121/73 (BP Location: Left Arm)   Pulse 77   Temp 98.7 F (37.1 C) (Oral)   Resp 16   Wt 62.3 kg   SpO2 99%   BMI 20.28 kg/m  Discharge exam:  Physical Exam Cardiovascular:     Rate and Rhythm: Normal rate and regular rhythm.  Pulmonary:     Effort: Pulmonary effort is normal.     Breath sounds: Normal breath sounds.  Skin:    General: Skin is warm and dry.  Neurological:     Mental Status: He is alert.      Pertinent Labs, Studies, and Procedures:     Latest Ref Rng & Units 05/05/2024    3:17 AM 05/04/2024    8:34 AM 05/03/2024    5:25 PM  CBC  WBC 4.0 - 10.5 K/uL 8.8  12.1  26.5   Hemoglobin 13.0 - 17.0 g/dL 86.8  87.8  84.2   Hematocrit 39.0 - 52.0 % 36.5  35.3  47.5   Platelets 150 - 400 K/uL 322  322  419        Latest Ref Rng & Units 05/05/2024    3:17 AM 05/04/2024    8:34 AM 05/04/2024    3:07 AM  CMP  Glucose 70 - 99 mg/dL 761  376  827   BUN 6 - 20 mg/dL 17  9  11    Creatinine 0.61 - 1.24 mg/dL 9.06  8.96  8.82   Sodium 135 - 145 mmol/L 135  138  137   Potassium 3.5 - 5.1 mmol/L 3.7  3.8  4.0   Chloride 98 - 111 mmol/L 100  106  107   CO2 22 - 32 mmol/L 25  21  16    Calcium  8.9 - 10.3 mg/dL 9.5  9.2  9.5     No results found.   Discharge Instructions: Discharge Instructions     Call MD for:  difficulty breathing, headache or visual disturbances   Complete by: As directed    Call MD for:  extreme fatigue   Complete by: As directed    Call MD for:  persistant dizziness or light-headedness    Complete by: As directed    Call MD for:  persistant nausea and vomiting   Complete by: As directed    Call MD for:  temperature >100.4   Complete by: As directed    Diet - low sodium heart healthy   Complete by: As directed    Discharge instructions   Complete by: As directed    Thank you for allowing us  to be part of your care. You were hospitalized for DKA. We treated you with fluids, insulin , and monitoring electrolytes and blood glucose.  See the changes in your medications and management of your chronic conditions below:  *For your Uncontrolled Type 1 Diabetes   We have CONTINUED you on these following medications:          - Insulin  Glargine (long acting insulin ) 30 Units ONCE A DAY.           - Insulin  Aspart (short acting insulin ) 7 Units THREE TIMES A DAY before meals.  You will not need to take your insulin  glargine 05/05/2024 (day of discharge) as you have already received it.   We ENCOURAGE you to: Take your insulin  as instructed. As we discussed with you in the hospital, if you do not take your insulin  you risk readmission for DKA or even death.   Check your blood sugar with a glucometer at least daily (in the morning)  *For your Schizoaffective Disorder -We have CONTINUED you on the following medications             - haloperidol  deconoate (Haldol  deconate) 100 mg/ml injection  -Hydroxyzine  25 mg tablet, take three times by mouth daily as needed for anxiety              - Trazodone  50 mg by mouth at bedtime    FOLLOW UP APPOINTMENTS: We arranged for you to follow up with the internal medicine residency clinic in 7 days.  You will be contacted by our clinic. If you do not hear from us  by Wednesday, please call the number below.   Please make sure to follow up with [facility] to receive your monthly haloperidol  shot.   Please call your PCP or our clinic if you have any questions or concerns, we may be able to help and keep you from a long and expensive emergency room  wait. Our clinic and after hours phone number is [phone]. The best time to call is Monday through Friday 9 am to 4 pm but there is always someone available 24/7 if you have an emergency. If you need medication refills please notify your pharmacy one week in advance and they will send us  a request.   We are glad you are feeling better,  Sallyanne Primas, DO Internal Medicine Inpatient Teaching Service at [facility]   Increase activity slowly   Complete by: As directed        Signed: Primas Sallyanne, DO 05/05/2024, 4:54 PM

## 2024-05-05 NOTE — Discharge Instructions (Addendum)
 Thank you for allowing us  to be part of your care. You were hospitalized for DKA. We treated you with fluids, insulin , and monitoring electrolytes and blood glucose.   See the changes in your medications and management of your chronic conditions below:  *For your Uncontrolled Type 1 Diabetes   We have CONTINUED you on these following medications:          - Insulin  Glargine (long acting insulin ) 30 Units ONCE A DAY.           - Insulin  Aspart (short acting insulin ) 7 Units THREE TIMES A DAY before meals.  You will not need to take your insulin  glargine 05/05/2024 (day of discharge) as you have already received it.   We ENCOURAGE you to: Take your insulin  as instructed. As we discussed with you in the hospital, if you do not take your insulin  you risk readmission for DKA or even death.   Check your blood sugar with a glucometer at least daily (in the morning)  *For your Schizoaffective Disorder -We have CONTINUED you on the following medications             - haloperidol  deconoate (Haldol  deconate) 100 mg/ml injection  - Hydroxyzine  25 mg tablet, take three times by mouth daily as needed for anxiety              - Trazodone  50 mg by mouth at bedtime    FOLLOW UP APPOINTMENTS: We arranged for you to follow up with the internal medicine residency clinic in 7 days.  You will be contacted by our clinic. If you do not hear from us  by Wednesday, please call the number below.   Please make sure to follow up with [facility] to receive your monthly haloperidol  shot.   Please call your PCP or our clinic if you have any questions or concerns, we may be able to help and keep you from a long and expensive emergency room wait. Our clinic and after hours phone number is [phone]. The best time to call is Monday through Friday 9 am to 4 pm but there is always someone available 24/7 if you have an emergency. If you need medication refills please notify your pharmacy one week in advance and they will send us   a request.   We are glad you are feeling better,  Sallyanne Primas, DO Internal Medicine Inpatient Teaching Service at [facility]

## 2024-05-09 ENCOUNTER — Telehealth (HOSPITAL_COMMUNITY): Payer: Self-pay | Admitting: *Deleted

## 2024-05-09 NOTE — Telephone Encounter (Addendum)
 Fax from [facility] for two rx for this patient. Both requests were to the attention of Mac Bolster who works on the behavioral health inpatient unit not here. He has recently been discharged from the hospital. He has MCD ins. He has not been to this clinic in a year. Sending this concern for his vit D and hydroxyzine  to the chief resident since this patient has no assignment for this clinic as its been over a year since seen and once d/c from hospital the inpatient unit doesn't fill rx requests. Spoke with a peer RN re who do address this concern to and she suggested the pharmacy. I called [facility] with concern and they said they would reach out to the patient to have him call to make an appt. The D/C note from [facility] was not clear to this writer what his discharge arrangements were.

## 2024-05-10 NOTE — Telephone Encounter (Signed)
 Looks like patient was accepted for ACT team services [facility]. I would defer to them for medication management and therapy.

## 2024-05-15 ENCOUNTER — Ambulatory Visit: Payer: Self-pay | Admitting: Student

## 2024-05-15 ENCOUNTER — Ambulatory Visit: Payer: Self-pay

## 2024-05-20 ENCOUNTER — Inpatient Hospital Stay (HOSPITAL_COMMUNITY)
Admission: EM | Admit: 2024-05-20 | Discharge: 2024-05-21 | DRG: 638 | Discharge status: Against Medical Advice | Payer: MEDICAID | Attending: Internal Medicine | Admitting: Internal Medicine

## 2024-05-20 ENCOUNTER — Other Ambulatory Visit: Payer: Self-pay

## 2024-05-20 ENCOUNTER — Encounter (HOSPITAL_COMMUNITY): Payer: Self-pay

## 2024-05-20 ENCOUNTER — Encounter (HOSPITAL_COMMUNITY): Payer: Self-pay | Admitting: *Deleted

## 2024-05-20 ENCOUNTER — Emergency Department (HOSPITAL_COMMUNITY)
Admission: EM | Admit: 2024-05-20 | Discharge: 2024-05-20 | Discharge status: Against Medical Advice | Payer: MEDICAID | Attending: Emergency Medicine | Admitting: Emergency Medicine

## 2024-05-20 DIAGNOSIS — F411 Generalized anxiety disorder: Secondary | ICD-10-CM | POA: Diagnosis present

## 2024-05-20 DIAGNOSIS — Z5329 Procedure and treatment not carried out because of patient's decision for other reasons: Secondary | ICD-10-CM | POA: Diagnosis not present

## 2024-05-20 DIAGNOSIS — D72829 Elevated white blood cell count, unspecified: Secondary | ICD-10-CM | POA: Diagnosis present

## 2024-05-20 DIAGNOSIS — Z79899 Other long term (current) drug therapy: Secondary | ICD-10-CM

## 2024-05-20 DIAGNOSIS — E875 Hyperkalemia: Secondary | ICD-10-CM | POA: Diagnosis present

## 2024-05-20 DIAGNOSIS — E1065 Type 1 diabetes mellitus with hyperglycemia: Secondary | ICD-10-CM | POA: Diagnosis not present

## 2024-05-20 DIAGNOSIS — N179 Acute kidney failure, unspecified: Secondary | ICD-10-CM | POA: Diagnosis present

## 2024-05-20 DIAGNOSIS — Z91148 Patient's other noncompliance with medication regimen for other reason: Secondary | ICD-10-CM | POA: Diagnosis present

## 2024-05-20 DIAGNOSIS — Z560 Unemployment, unspecified: Secondary | ICD-10-CM

## 2024-05-20 DIAGNOSIS — E1165 Type 2 diabetes mellitus with hyperglycemia: Secondary | ICD-10-CM | POA: Insufficient documentation

## 2024-05-20 DIAGNOSIS — R4182 Altered mental status, unspecified: Secondary | ICD-10-CM | POA: Diagnosis present

## 2024-05-20 DIAGNOSIS — F250 Schizoaffective disorder, bipolar type: Secondary | ICD-10-CM | POA: Diagnosis present

## 2024-05-20 DIAGNOSIS — Z5321 Procedure and treatment not carried out due to patient leaving prior to being seen by health care provider: Secondary | ICD-10-CM | POA: Insufficient documentation

## 2024-05-20 DIAGNOSIS — Z794 Long term (current) use of insulin: Secondary | ICD-10-CM

## 2024-05-20 DIAGNOSIS — E1010 Type 1 diabetes mellitus with ketoacidosis without coma: Principal | ICD-10-CM | POA: Diagnosis present

## 2024-05-20 DIAGNOSIS — F17290 Nicotine dependence, other tobacco product, uncomplicated: Secondary | ICD-10-CM | POA: Diagnosis present

## 2024-05-20 DIAGNOSIS — E860 Dehydration: Secondary | ICD-10-CM | POA: Diagnosis present

## 2024-05-20 DIAGNOSIS — E1110 Type 2 diabetes mellitus with ketoacidosis without coma: Secondary | ICD-10-CM | POA: Diagnosis present

## 2024-05-20 LAB — CBG MONITORING, ED
Glucose-Capillary: 327 mg/dL — ABNORMAL HIGH (ref 70–99)
Glucose-Capillary: 433 mg/dL — ABNORMAL HIGH (ref 70–99)
Glucose-Capillary: 548 mg/dL (ref 70–99)
Glucose-Capillary: >600 mg/dL (ref 70–99)
Glucose-Capillary: >600 mg/dL (ref 70–99)

## 2024-05-20 LAB — CBC WITH DIFFERENTIAL/PLATELET
Abs Immature Granulocytes: 0.06 K/uL (ref 0.00–0.07)
Basophils Absolute: 0 K/uL (ref 0.0–0.1)
Basophils Relative: 0 %
Eosinophils Absolute: 0 K/uL (ref 0.0–0.5)
Eosinophils Relative: 0 %
HCT: 39.7 % (ref 39.0–52.0)
Hemoglobin: 12.8 g/dL — ABNORMAL LOW (ref 13.0–17.0)
Immature Granulocytes: 1 %
Lymphocytes Relative: 9 %
Lymphs Abs: 1.2 K/uL (ref 0.7–4.0)
MCH: 29.3 pg (ref 26.0–34.0)
MCHC: 32.2 g/dL (ref 30.0–36.0)
MCV: 90.8 fL (ref 80.0–100.0)
Monocytes Absolute: 0.5 K/uL (ref 0.1–1.0)
Monocytes Relative: 4 %
Neutro Abs: 11.3 K/uL — ABNORMAL HIGH (ref 1.7–7.7)
Neutrophils Relative %: 86 %
Platelets: 373 K/uL (ref 150–400)
RBC: 4.37 MIL/uL (ref 4.22–5.81)
RDW: 12.2 % (ref 11.5–15.5)
WBC: 13 K/uL — ABNORMAL HIGH (ref 4.0–10.5)
nRBC: 0 % (ref 0.0–0.2)

## 2024-05-20 LAB — COMPREHENSIVE METABOLIC PANEL WITH GFR
ALT: 27 U/L (ref 0–44)
AST: 24 U/L (ref 15–41)
Albumin: 3.7 g/dL (ref 3.5–5.0)
Alkaline Phosphatase: 113 U/L (ref 38–126)
Anion gap: 25 — ABNORMAL HIGH (ref 5–15)
BUN: 26 mg/dL — ABNORMAL HIGH (ref 6–20)
CO2: 10 mmol/L — ABNORMAL LOW (ref 22–32)
Calcium: 8.6 mg/dL — ABNORMAL LOW (ref 8.9–10.3)
Chloride: 100 mmol/L (ref 98–111)
Creatinine, Ser: 2.22 mg/dL — ABNORMAL HIGH (ref 0.61–1.24)
GFR, Estimated: 39 mL/min — ABNORMAL LOW (ref >60)
Glucose, Bld: 615 mg/dL (ref 70–99)
Potassium: 5.6 mmol/L — ABNORMAL HIGH (ref 3.5–5.1)
Sodium: 135 mmol/L (ref 135–145)
Total Bilirubin: 1.7 mg/dL — ABNORMAL HIGH (ref 0.0–1.2)
Total Protein: 7.8 g/dL (ref 6.5–8.1)

## 2024-05-20 LAB — URINALYSIS, ROUTINE W REFLEX MICROSCOPIC
Bacteria, UA: NONE SEEN
Bilirubin Urine: NEGATIVE
Glucose, UA: >=500 mg/dL — AB
Hgb urine dipstick: NEGATIVE
Ketones, ur: 80 mg/dL — AB
Leukocytes,Ua: NEGATIVE
Nitrite: NEGATIVE
Protein, ur: 30 mg/dL — AB
Specific Gravity, Urine: 1.023 (ref 1.005–1.030)
pH: 5 (ref 5.0–8.0)

## 2024-05-20 LAB — I-STAT CHEM 8, ED
BUN: 27 mg/dL — ABNORMAL HIGH (ref 6–20)
Calcium, Ion: 1.07 mmol/L — ABNORMAL LOW (ref 1.15–1.40)
Chloride: 107 mmol/L (ref 98–111)
Creatinine, Ser: 1.2 mg/dL (ref 0.61–1.24)
Glucose, Bld: 563 mg/dL (ref 70–99)
HCT: 45 % (ref 39.0–52.0)
Hemoglobin: 15.3 g/dL (ref 13.0–17.0)
Potassium: 5.2 mmol/L — ABNORMAL HIGH (ref 3.5–5.1)
Sodium: 137 mmol/L (ref 135–145)
TCO2: 11 mmol/L — ABNORMAL LOW (ref 22–32)

## 2024-05-20 LAB — I-STAT VENOUS BLOOD GAS, ED
Acid-base deficit: 18 mmol/L — ABNORMAL HIGH (ref 0.0–2.0)
Bicarbonate: 8.8 mmol/L — ABNORMAL LOW (ref 20.0–28.0)
Calcium, Ion: 1.07 mmol/L — ABNORMAL LOW (ref 1.15–1.40)
HCT: 43 % (ref 39.0–52.0)
Hemoglobin: 14.6 g/dL (ref 13.0–17.0)
O2 Saturation: 55 %
Potassium: 5.2 mmol/L — ABNORMAL HIGH (ref 3.5–5.1)
Sodium: 136 mmol/L (ref 135–145)
TCO2: 10 mmol/L — ABNORMAL LOW (ref 22–32)
pCO2, Ven: 25.4 mmHg — ABNORMAL LOW (ref 44–60)
pH, Ven: 7.148 — CL (ref 7.25–7.43)
pO2, Ven: 37 mmHg (ref 32–45)

## 2024-05-20 LAB — BETA-HYDROXYBUTYRIC ACID: Beta-Hydroxybutyric Acid: >8 mmol/L — ABNORMAL HIGH (ref 0.05–0.27)

## 2024-05-20 MED ORDER — DEXTROSE 50 % IV SOLN: 0.0000 mL | INTRAVENOUS | Status: DC | PRN

## 2024-05-20 MED ORDER — INSULIN REGULAR(HUMAN) IN NACL 100-0.9 UT/100ML-% IV SOLN
INTRAVENOUS | Status: DC
  Administered 2024-05-20: 8.5 [IU]/h via INTRAVENOUS
  Filled 2024-05-20 (×2): qty 100

## 2024-05-20 MED ORDER — ACETAMINOPHEN 650 MG RE SUPP: 650.0000 mg | Freq: Four times a day (QID) | RECTAL | Status: DC | PRN

## 2024-05-20 MED ORDER — LACTATED RINGERS IV BOLUS
1000.0000 mL | Freq: Once | INTRAVENOUS | Status: AC
  Administered 2024-05-20: 1000 mL via INTRAVENOUS

## 2024-05-20 MED ORDER — LACTATED RINGERS IV SOLN: INTRAVENOUS | Status: DC

## 2024-05-20 MED ORDER — SENNOSIDES-DOCUSATE SODIUM 8.6-50 MG PO TABS: 1.0000 | ORAL_TABLET | Freq: Every evening | ORAL | Status: DC | PRN

## 2024-05-20 MED ORDER — DEXTROSE IN LACTATED RINGERS 5 % IV SOLN: INTRAVENOUS | Status: DC

## 2024-05-20 MED ORDER — ACETAMINOPHEN 325 MG PO TABS
650.0000 mg | ORAL_TABLET | Freq: Four times a day (QID) | ORAL | Status: DC | PRN
Start: 2024-05-20 — End: 2024-05-22

## 2024-05-20 MED ORDER — ENOXAPARIN SODIUM 40 MG/0.4ML IJ SOSY
40.0000 mg | PREFILLED_SYRINGE | Freq: Every day | INTRAMUSCULAR | Status: DC
  Administered 2024-05-21: 40 mg via SUBCUTANEOUS
  Filled 2024-05-20: qty 0.4

## 2024-05-20 NOTE — Hospital Course (Addendum)
 DKA, Altered Awake  7.14   Medications: Benztropine  0.5 mg BID Dexcom  Haloperidol  decanoate 100 mg injection every 30 days Hydroxyzine  25 mf TID Lantus  30 units daily  Novolog  7 units TID with meals  Trazodone  50 mg daily  Vitamin D  50000 IU daily

## 2024-05-20 NOTE — ED Provider Notes (Signed)
 [place] [facility] AT [name] Provider Note   CSN: [id number] Arrival date & time: 05/20/24  2200     Patient presents with: Hyperglycemia and Hypotension   Jacob [name] is a 33 y.o. male.   HPI 33 year old male presents with concern for DKA.  EMS is no longer present when I am talking to the patient and he seems altered which limits the history.  He was evaluated in triage and left without being seen earlier tonight.  When asked where he is he responds DKA.  Prior to Admission medications   Medication Sig Start Date End Date Taking? Authorizing Provider  benztropine  (COGENTIN ) 0.5 MG tablet Take 1 tablet (0.5 mg total) by mouth 2 (two) times daily. For prevention of EPS Patient not taking: Reported on 05/04/2024 02/09/24   Collene Gouge I, NP  Continuous Glucose Sensor (DEXCOM G6 SENSOR) MISC Change site every 10 days. Patient not taking: Reported on 05/04/2024 03/20/24   Stephanie Freund, MD  haloperidol  decanoate (HALDOL  DECANOATE) 100 MG/ML injection Inject 1 mL (100 mg total) into the muscle every 30 (thirty) days. (Due on 03-09-24): For mood control Patient not taking: Reported on 02/19/2024 03/09/24   Collene Gouge I, NP  hydrOXYzine  (ATARAX ) 25 MG tablet Take 25 mg by mouth 3 (three) times daily as needed for anxiety. Patient not taking: Reported on 05/04/2024    [provider]  insulin  aspart (NOVOLOG  FLEXPEN) 100 UNIT/ML FlexPen Inject 7 Units into the skin 3 (three) times daily before meals. For diabetes control Patient not taking: Reported on 05/04/2024 03/20/24   Stephanie Freund, MD  insulin  glargine (LANTUS  SOLOSTAR) 100 UNIT/ML Solostar Pen Inject 30 Units into the skin daily. For diabetes control. Patient not taking: Reported on 05/04/2024 03/20/24   Stephanie Freund, MD  traZODone  (DESYREL ) 50 MG tablet Take 50 mg by mouth at bedtime. Patient not taking: Reported on 05/04/2024    [provider]  Vitamin D , Ergocalciferol , (DRISDOL ) 1.25 MG  (50000 UNIT) CAPS capsule Take 1 capsule (50,000 Units total) by mouth every 7 (seven) days. Patient not taking: Reported on 02/19/2024 02/13/24   Collene Gouge I, NP    Allergies: Patient has no known allergies.    Review of Systems  Unable to perform ROS: Mental status change    Updated Vital Signs BP (!) 147/103   Pulse (!) 108   Temp 97.7 F (36.5 C) (Oral)   Resp 11   Ht 5' 9 (1.753 m)   Wt 62.3 kg   SpO2 99%   BMI 20.28 kg/m   Physical Exam Vitals and nursing note reviewed.  Constitutional:      Appearance: He is well-developed. He is ill-appearing.  HENT:     Head: Normocephalic and atraumatic.  Cardiovascular:     Rate and Rhythm: Regular rhythm. Tachycardia present.     Heart sounds: Normal heart sounds.  Pulmonary:     Effort: Tachypnea present.     Breath sounds: Normal breath sounds.  Abdominal:     General: There is no distension.     Palpations: Abdomen is soft.     Tenderness: There is no abdominal tenderness.  Skin:    General: Skin is warm and dry.  Neurological:     Mental Status: He is alert.     Comments: Patient is awake but confused.  Moves all 4 extremities.  Seems mildly lethargic.     (all labs ordered are listed, but only abnormal results are displayed) Labs Reviewed  CBC  WITH DIFFERENTIAL/PLATELET - Abnormal; Notable for the following components:      Result Value   WBC 13.0 (*)    Hemoglobin 12.8 (*)    Neutro Abs 11.3 (*)    All other components within normal limits  CBG MONITORING, ED - Abnormal; Notable for the following components:   Glucose-Capillary >600 (*)    All other components within normal limits  I-STAT CHEM 8, ED - Abnormal; Notable for the following components:   Potassium 5.2 (*)    BUN 27 (*)    Glucose, Bld 563 (*)    Calcium , Ion 1.07 (*)    TCO2 11 (*)    All other components within normal limits  I-STAT VENOUS BLOOD GAS, ED - Abnormal; Notable for the following components:   pH, Ven 7.148 (*)    pCO2, Ven  25.4 (*)    Bicarbonate 8.8 (*)    TCO2 10 (*)    Acid-base deficit 18.0 (*)    Potassium 5.2 (*)    Calcium , Ion 1.07 (*)    All other components within normal limits  CBG MONITORING, ED - Abnormal; Notable for the following components:   Glucose-Capillary 548 (*)    All other components within normal limits  COMPREHENSIVE METABOLIC PANEL WITH GFR  BETA-HYDROXYBUTYRIC ACID  URINALYSIS, ROUTINE W REFLEX MICROSCOPIC    EKG: EKG Interpretation Date/Time:  Sunday May 20 2024 22:15:01 EDT Ventricular Rate:  105 PR Interval:  134 QRS Duration:  89 QT Interval:  376 QTC Calculation: 497 R Axis:   97  Text Interpretation: Sinus tachycardia Right atrial enlargement Borderline right axis deviation Confirmed by Freddi Hamilton [phone]) on 05/20/2024 10:29:50 PM  Radiology: No results found.   .Critical Care  Performed by: Freddi Hamilton, MD Authorized by: Freddi Hamilton, MD   Critical care provider statement:    Critical care time (minutes):  30   Critical care time was exclusive of:  Separately billable procedures and treating other patients   Critical care was necessary to treat or prevent imminent or life-threatening deterioration of the following conditions:  Endocrine crisis   Critical care was time spent personally by me on the following activities:  Development of treatment plan with patient or surrogate, discussions with consultants, evaluation of patient's response to treatment, examination of patient, ordering and review of laboratory studies, ordering and review of radiographic studies, ordering and performing treatments and interventions, pulse oximetry, re-evaluation of patient's condition and review of old charts    Medications Ordered in the ED  insulin  regular, human (MYXREDLIN ) 100 units/ 100 mL infusion (8.5 Units/hr Intravenous New Bag/Given 05/20/24 2252)  lactated ringers  infusion (has no administration in time range)  dextrose  5 % in lactated ringers  infusion  (has no administration in time range)  dextrose  50 % solution 0-50 mL (has no administration in time range)  lactated ringers  bolus 1,000 mL (1,000 mLs Intravenous New Bag/Given 05/20/24 2220)  lactated ringers  bolus 1,000 mL (1,000 mLs Intravenous New Bag/Given 05/20/24 2215)                                    Medical Decision Making Amount and/or Complexity of Data Reviewed Labs: ordered.    Details: Metabolic acidosis c/w DKA ECG/medicine tests: ordered and independent interpretation performed.    Details: Sinus rhythm  Risk Prescription drug management. Decision regarding hospitalization.   Patient presents with recurrent DKA.  He is awake and alert  though a little sleepy.  Otherwise was started on IV fluids and IV insulin .  Mild hyperkalemia at 5.2, should correct with the insulin  and fluids.  I do not think he needs any emergent therapy such as calcium .  Discussed with the internal medicine teaching service who will admit.     Final diagnoses:  Diabetic ketoacidosis without coma associated with type 1 diabetes mellitus [facility])    ED Discharge Orders     None          Freddi Hamilton, MD 05/20/24 2255

## 2024-05-20 NOTE — ED Notes (Signed)
 The pt got up said he was leaving and that he would take his med at home    he walked out

## 2024-05-20 NOTE — ED Triage Notes (Signed)
 The pt  is a diabetic but has not checked his sugar lately and reports that he is feeling bad ??

## 2024-05-20 NOTE — ED Triage Notes (Signed)
 Not taking him out of the computer in case the mother brings him back in

## 2024-05-20 NOTE — ED Triage Notes (Signed)
 Pt arrived via [facility] c/o hyperglycemia and hypotensive after leaving ED waiting room, pt had swift decline and called 911. CBG per ems greater than 600

## 2024-05-20 NOTE — ED Notes (Signed)
 Cbg in triage read high  pt said his mother would not let him take his meds

## 2024-05-20 NOTE — ED Notes (Signed)
 Security saw Pt leaving hospital.

## 2024-05-20 NOTE — ED Notes (Signed)
 CCMD called and pt placed on central monitoring

## 2024-05-21 ENCOUNTER — Telehealth: Payer: Self-pay | Admitting: *Deleted

## 2024-05-21 DIAGNOSIS — E1065 Type 1 diabetes mellitus with hyperglycemia: Secondary | ICD-10-CM

## 2024-05-21 LAB — BETA-HYDROXYBUTYRIC ACID
Beta-Hydroxybutyric Acid: 4.28 mmol/L — ABNORMAL HIGH (ref 0.05–0.27)
Beta-Hydroxybutyric Acid: 3.24 mmol/L — ABNORMAL HIGH (ref 0.05–0.27)

## 2024-05-21 LAB — BASIC METABOLIC PANEL WITH GFR
Anion gap: 16 — ABNORMAL HIGH (ref 5–15)
Anion gap: 16 — ABNORMAL HIGH (ref 5–15)
Anion gap: 14 (ref 5–15)
Anion gap: 10 (ref 5–15)
Anion gap: 9 (ref 5–15)
BUN: 19 mg/dL (ref 6–20)
BUN: 16 mg/dL (ref 6–20)
BUN: 12 mg/dL (ref 6–20)
BUN: 11 mg/dL (ref 6–20)
BUN: 15 mg/dL (ref 6–20)
CO2: 17 mmol/L — ABNORMAL LOW (ref 22–32)
CO2: 19 mmol/L — ABNORMAL LOW (ref 22–32)
CO2: 22 mmol/L (ref 22–32)
CO2: 20 mmol/L — ABNORMAL LOW (ref 22–32)
CO2: 24 mmol/L (ref 22–32)
Calcium: 9 mg/dL (ref 8.9–10.3)
Calcium: 9.3 mg/dL (ref 8.9–10.3)
Calcium: 9.3 mg/dL (ref 8.9–10.3)
Calcium: 8.5 mg/dL — ABNORMAL LOW (ref 8.9–10.3)
Calcium: 8.7 mg/dL — ABNORMAL LOW (ref 8.9–10.3)
Chloride: 105 mmol/L (ref 98–111)
Chloride: 103 mmol/L (ref 98–111)
Chloride: 103 mmol/L (ref 98–111)
Chloride: 105 mmol/L (ref 98–111)
Chloride: 95 mmol/L — ABNORMAL LOW (ref 98–111)
Creatinine, Ser: 1.48 mg/dL — ABNORMAL HIGH (ref 0.61–1.24)
Creatinine, Ser: 1.32 mg/dL — ABNORMAL HIGH (ref 0.61–1.24)
Creatinine, Ser: 1.04 mg/dL (ref 0.61–1.24)
Creatinine, Ser: 0.83 mg/dL (ref 0.61–1.24)
Creatinine, Ser: 1.02 mg/dL (ref 0.61–1.24)
GFR, Estimated: >60 mL/min (ref >60)
GFR, Estimated: >60 mL/min (ref >60)
GFR, Estimated: >60 mL/min (ref >60)
GFR, Estimated: >60 mL/min (ref >60)
GFR, Estimated: >60 mL/min (ref >60)
Glucose, Bld: 209 mg/dL — ABNORMAL HIGH (ref 70–99)
Glucose, Bld: 191 mg/dL — ABNORMAL HIGH (ref 70–99)
Glucose, Bld: 186 mg/dL — ABNORMAL HIGH (ref 70–99)
Glucose, Bld: 287 mg/dL — ABNORMAL HIGH (ref 70–99)
Glucose, Bld: 322 mg/dL — ABNORMAL HIGH (ref 70–99)
Potassium: 4.4 mmol/L (ref 3.5–5.1)
Potassium: 4.4 mmol/L (ref 3.5–5.1)
Potassium: 4.1 mmol/L (ref 3.5–5.1)
Potassium: 4.3 mmol/L (ref 3.5–5.1)
Potassium: 4.3 mmol/L (ref 3.5–5.1)
Sodium: 138 mmol/L (ref 135–145)
Sodium: 138 mmol/L (ref 135–145)
Sodium: 139 mmol/L (ref 135–145)
Sodium: 135 mmol/L (ref 135–145)
Sodium: 128 mmol/L — ABNORMAL LOW (ref 135–145)

## 2024-05-21 LAB — CBC
HCT: 40 % (ref 39.0–52.0)
Hemoglobin: 13.4 g/dL (ref 13.0–17.0)
MCH: 29.1 pg (ref 26.0–34.0)
MCHC: 33.5 g/dL (ref 30.0–36.0)
MCV: 87 fL (ref 80.0–100.0)
Platelets: 402 K/uL — ABNORMAL HIGH (ref 150–400)
RBC: 4.6 MIL/uL (ref 4.22–5.81)
RDW: 12.3 % (ref 11.5–15.5)
WBC: 15.5 K/uL — ABNORMAL HIGH (ref 4.0–10.5)
nRBC: 0 % (ref 0.0–0.2)

## 2024-05-21 LAB — CBG MONITORING, ED
Glucose-Capillary: 209 mg/dL — ABNORMAL HIGH (ref 70–99)
Glucose-Capillary: 200 mg/dL — ABNORMAL HIGH (ref 70–99)
Glucose-Capillary: 181 mg/dL — ABNORMAL HIGH (ref 70–99)
Glucose-Capillary: 156 mg/dL — ABNORMAL HIGH (ref 70–99)
Glucose-Capillary: 131 mg/dL — ABNORMAL HIGH (ref 70–99)
Glucose-Capillary: 138 mg/dL — ABNORMAL HIGH (ref 70–99)
Glucose-Capillary: 160 mg/dL — ABNORMAL HIGH (ref 70–99)
Glucose-Capillary: 106 mg/dL — ABNORMAL HIGH (ref 70–99)

## 2024-05-21 LAB — GLUCOSE, CAPILLARY
Glucose-Capillary: 105 mg/dL — ABNORMAL HIGH (ref 70–99)
Glucose-Capillary: 300 mg/dL — ABNORMAL HIGH (ref 70–99)

## 2024-05-21 MED ORDER — INSULIN ASPART 100 UNIT/ML IJ SOLN
3.0000 [IU] | Freq: Three times a day (TID) | INTRAMUSCULAR | Status: DC
  Administered 2024-05-21: 3 [IU] via SUBCUTANEOUS

## 2024-05-21 MED ORDER — INSULIN ASPART 100 UNIT/ML IJ SOLN: 0.0000 [IU] | Freq: Every day | INTRAMUSCULAR | Status: DC

## 2024-05-21 MED ORDER — POTASSIUM CHLORIDE CRYS ER 20 MEQ PO TBCR
40.0000 meq | EXTENDED_RELEASE_TABLET | Freq: Once | ORAL | Status: AC
  Administered 2024-05-21: 40 meq via ORAL
  Filled 2024-05-21: qty 2

## 2024-05-21 MED ORDER — INSULIN GLARGINE-YFGN 100 UNIT/ML ~~LOC~~ SOLN
20.0000 [IU] | Freq: Every day | SUBCUTANEOUS | Status: DC
  Administered 2024-05-21: 20 [IU] via SUBCUTANEOUS
  Filled 2024-05-21: qty 0.2

## 2024-05-21 MED ORDER — INSULIN GLARGINE-YFGN 100 UNIT/ML ~~LOC~~ SOLN: 40.0000 [IU] | Freq: Every day | SUBCUTANEOUS | Status: DC

## 2024-05-21 MED ORDER — INSULIN ASPART 100 UNIT/ML IJ SOLN
0.0000 [IU] | Freq: Three times a day (TID) | INTRAMUSCULAR | Status: DC
  Administered 2024-05-21: 8 [IU] via SUBCUTANEOUS

## 2024-05-21 NOTE — Progress Notes (Cosign Needed Addendum)
 HD#1 SUBJECTIVE:  Patient Summary: Jacob [name] is a 33 y.o. with a pertinent PMH of T1DM on insulin , MDD, GAD, schizoaffective disorder--bipolar type, who presented with altered mentation and hyperglycemia and admitted for DKA. His glucose was initially >600. [place] and has been having issues with his roommate who does not turn on the A/C, so he spent a lot of time outside yesterday. He also took his insulin  later in the day that usual. Patient had altered mentation, emesis, polydipsia, and abnormal breathing so his mother took him to the hospital. pH on initial CBG was 7.14. AG 25  Overnight Events: No overnight events  Interim History: Patient has improved since admission with no n/v or abnormal breathing. He still has fatigue and difficulty with conversing as normal. He is not able to adequately store his insulin  due to lack of access to a safe fridge at the pallet houses. He does not feel comfortable staying with his mother or step father or using their fridges.   OBJECTIVE:  Vital Signs: Vitals:   05/21/24 0619 05/21/24 0730 05/21/24 0800 05/21/24 0845  BP:  121/89 (!) 119/90 (!) 118/97  Pulse:  74 68 72  Resp:  12 13 10   Temp: 97.8 F (36.6 C) 98.3 F (36.8 C)    TempSrc: Oral Oral    SpO2:  100% 100% 100%  Weight:      Height:       Supplemental O2: Room Air SpO2: 100 %  Filed Weights   05/20/24 2202  Weight: 62.3 kg     Intake/Output Summary (Last 24 hours) at 05/21/2024 1106 Last data filed at 05/20/2024 2337 Gross per 24 hour  Intake 2008.63 ml  Output --  Net 2008.63 ml   Net IO Since Admission: 2,008.63 mL [05/21/24 1106]  Physical Exam: Physical Exam Const: Able to converse at a slower pace, awake and alert HENT: Normocephalic, atraumatic Card: RRR, No MRG, No pitting edema on LE's bilaterally  Resp: LCTAB, no increased work of breathing Extremities: Warm, pink Patient Lines/Drains/Airways Status     Active Line/Drains/Airways      Name Placement date Placement time Site Days   Peripheral IV 05/21/24 20 G Anterior;Proximal;Right Forearm 05/21/24  0619  Forearm  less than 1   Peripheral IV 05/21/24 20 G Anterior;Right Forearm 05/21/24  0619  Forearm  less than 1            Pertinent labs and imaging:  Beta-hydroxybutyric acid: 3.24, downtrending from 8.00    Latest Ref Rng & Units 05/21/2024    3:58 AM 05/20/2024   10:28 PM 05/20/2024   10:27 PM  CBC  WBC 4.0 - 10.5 K/uL 15.5     Hemoglobin 13.0 - 17.0 g/dL 86.5  85.3  84.6   Hematocrit 39.0 - 52.0 % 40.0  43.0  45.0   Platelets 150 - 400 K/uL 402          Latest Ref Rng & Units 05/21/2024    7:43 AM 05/21/2024    3:58 AM 05/20/2024   10:28 PM  CMP  Glucose 70 - 99 mg/dL 813  808    BUN 6 - 20 mg/dL 12  16    Creatinine 9.38 - 1.24 mg/dL 8.95  8.67    Sodium 864 - 145 mmol/L 139  138  136   Potassium 3.5 - 5.1 mmol/L 4.1  4.4  5.2   Chloride 98 - 111 mmol/L 103  103    CO2 22 -  32 mmol/L 22  19    Calcium  8.9 - 10.3 mg/dL 9.3  9.3      No results found.  ASSESSMENT/PLAN:  Assessment: Principal Problem:   DKA (diabetic ketoacidosis) (HCC) Active Problems:   Uncontrolled type 1 diabetes mellitus with hyperglycemia, with long-term current use of insulin  (HCC)   AKI (acute kidney injury) (HCC)   Hyperkalemia   Nonadherence to medication   Leukocytosis   Plan: #DKA Uncontrolled type 1 DM Patient has improvement in altered mentation and is alert, awake, and oriented. He converses in slower sentences than what is typical for him, but reports improvement in symptoms from when he first came to the hospital. He has inability to consistently store his insulin  due to lack of access to a safe fridge in pallet housing. His most recent AG was 14. Most recent glucose is 186. Since patient began to have closed AG, can restart Lantus  along with insulin  infusion until AG is closed 2x.  -Lantus  20 U every day -insulin  infusion until AG is closed 2x,  -monitor  AG in next BMP -BMP q4h  #Hyperkalemia Potassium levels most recently 4.1 improved from 5.2.  -AM RFP -KCL 40 meQ to maintain between 4-5  #AKI Scr 1.32 which is improving from initial of 2.22. Likely due to hypoperfusion 2/2 to volume depletion and osmotic diuersis.  -encourage oral hydration  #Leukocytosis Patient most recent WBC 15.5 up from prior of 13.0. Most recent platelets elevated at 402. Likely reactive in etiology -AM CBC to monitor  Best Practice: Diet: Regular diet IVF: Fluids: none, Rate: None VTE: enoxaparin  (LOVENOX ) injection 40 mg Start: 05/20/24 2345 Code: Full  Disposition planning: Therapy Recs: Pending, DME: none Family Contact: mother, to be notified. DISPO: Anticipated discharge pending to Home pending clinical improvement.  Signature:  Brad Prey MS3  11:06 AM, 05/21/2024  On Call pager [phone]   I was personally present and re-performed the exam and medical decision making and verified the service and findings are accurately documented in the student's note.  Lurline Caver, DO 05/21/2024 11:57 AM

## 2024-05-21 NOTE — Progress Notes (Signed)
 Patient left AMA around 8:40 PM.  Patient left without signing any papers.

## 2024-05-21 NOTE — Telephone Encounter (Signed)
 RTC to patient's mother.  Just wanted to let doctors know he is in  [facility].  Copied from CRM [id number]. Topic: General - Other >> May 21, 2024  2:25 PM Miquel SAILOR wrote: Reason for CRM: patient Mother stated he was admitted 07/20 due to sugar high. Needs call back [phone]

## 2024-05-21 NOTE — ED Notes (Signed)
 Called Warehouse manager to notify patient would be coming up stairs, transport called

## 2024-05-21 NOTE — Discharge Summary (Incomplete)
   Name: Jacob [name] MRN: [id number] DOB: [birth year] 33 y.o. PCP: Napoleon Limes, MD  Date of Admission: 05/20/2024 10:00 PM Date of Discharge: 05/21/2024 Attending Physician: No att. providers found   Patient was discharged Against Medical Advice  Discharge Diagnosis: 1. ***  Discharge Medications: Allergies as of 05/21/2024   No Known Allergies   Med Rec must be completed prior to using this [facility]***       Disposition and follow-up:   Jacob [name] was discharged from [facility] in {DISCHARGE CONDITION:19696} condition.  At the hospital follow up visit please address:  1.  ***  2.  Labs / imaging needed at time of follow-up: ***  3.  Pending labs/ test needing follow-up: ***  Follow-up Appointments:   Hospital Course by problem list: 1. ***  Discharge Exam:   BP (!) 129/91 (BP Location: Left Arm)   Pulse 76   Temp 97.6 F (36.4 C) (Oral)   Resp 18   Ht 5' 9 (1.753 m)   Wt 62.3 kg   SpO2 100%   BMI 20.28 kg/m  Discharge exam: ***  Pertinent Labs, Studies, and Procedures:  ***  Discharge Instructions:   SignedBETHA Edgardo Pontiff, DO 05/21/2024, 9:19 PM   Pager: @[phone] @

## 2024-05-21 NOTE — H&P (Signed)
 Date: 05/21/2024               Patient Name:  Jacob [name] MRN: [id number]  DOB: 1991-09-08 Age / Sex: 33 y.o., male   PCP: Jacob Limes, MD         Medical Service: Internal Medicine Teaching Service         Attending Physician: Dr. Dayton [name]      First Contact: Jacob Primas, DO    Second Contact: Dr. Hadassah Kristy Ahr, MD          Pager Information: First Contact Pager: [phone]   Second Contact Pager: [phone]   SUBJECTIVE   Chief Complaint: DKA  History of Present Illness: Jacob [name] is a 32 y.o. male with PMH of type 1 diabetes on insulin , MDD with psychosis, generalized anxiety disorder, schizoaffective disorder-bipolar type who was brought in by EMS due to elevated blood sugar levels. CBG per EMS was greater then 600.  On our evaluation, patient had altered mentation and was unable to answer questions.  Majority of the history was acquired from the patient's mother and ED notes.  Patient's mother mentioned that [facility] lives at the [facility] and that he has been having issues with his roommate.  As per the mother, patient's roommate did not turn the [facility] on which led to the patient standing outside in the heat.  Mother also mentioned that patient did not take his insulin  in the morning and instead took it around 1:30 to 2 PM today when she came to see him.  She said patient's face was looking funny, he was throwing up, and was having a lot of water  and Zero drinks.  He wanted her to take him to the hospital. She said patient felt very weak and he was breathing very heavily.  Patient came to the ED and was evaluated by triage.  He left without seeing an ED provider. He was brought in later by EMS where he had a swift decline and called 911, as per ED note.  Mother was not sure if patient had fever or not.  She said he did not have any episodes of diarrhea or abdominal pain.    ED Course: Labs significant for CBG >600, pH: 7.14 Received LR and Endotool  started Consulted IMTS  Meds:  mom reported: benztropine  (COGENTIN ) 0.5 MG tablet--doesn't take Continuous Glucose Sensor (DEXCOM G6 SENSOR) MISC--pt lost it  haloperidol  decanoate (HALDOL  DECANOATE) 100 MG/ML injection--mother said patient is not taking this as he was not put on it after his last discharge on 05/05/2024 hydrOXYzine  (ATARAX ) 25 MG tablet--patient has not taken this since last month insulin  aspart (NOVOLOG  FLEXPEN) 100 UNIT/ML FlexPen--misses it sometimes but most of the times he does take it insulin  glargine (LANTUS  SOLOSTAR) 100 UNIT/ML Solostar Pen--misses a day or two and takes it when mom gives it to him traZODone  (DESYREL ) 50 MG tablet--took 1 last night, took 1 or 2 times last week.  He usually takes it at night for sleep Vitamin D , Ergocalciferol , (DRISDOL ) 1.25 MG (50000 UNIT) CAPS capsule--mom was going to ask about this during his appointment on 05/08/2024.  However patient missed the appointment.  Currently does not take this.  Past Medical History type 1 diabetes, MDD with psychosis, generalized anxiety disorder, schizoaffective disorder-bipolar type  Social: As per mother  Lives With: [facility] Occupation: Unemployed Support: Does not receive adequate support Level of Function: Independent in all ADLs and IADLs PCP: Jacob Limes, MD  Substances: -Tobacco: Vapes daily -Alcohol:  Rarely drinks alcohol -Recreational Drug: No drug use  Family History:  Family History  Problem Relation Age of Onset   Healthy Mother    Healthy Father      Allergies: Allergies as of 05/20/2024   (No Known Allergies)    Review of Systems: A complete ROS was negative except as per HPI.   OBJECTIVE:   Physical Exam: Blood pressure (!) 147/103, pulse (!) 108, temperature 97.7 F (36.5 C), temperature source Oral, resp. rate 11, height 5' 9 (1.753 m), weight 62.3 kg, SpO2 99%.  Constitutional: Patient had altered mentation and was shivering HENT: normocephalic  atraumatic,  Cardiovascular: Tachycardic, no m/r/g Pulmonary/Chest: normal work of breathing on room air, lungs clear to auscultation bilaterally Abdominal: soft, non-tender, non-distended MSK: normal bulk and tone Neurological: Somnolent, able to wake to verbal stimuli Skin: warm and dry   Labs: CBC    Component Value Date/Time   WBC 13.0 (H) 05/20/2024 2225   RBC 4.37 05/20/2024 2225   HGB 14.6 05/20/2024 2228   HCT 43.0 05/20/2024 2228   PLT 373 05/20/2024 2225   MCV 90.8 05/20/2024 2225   MCH 29.3 05/20/2024 2225   MCHC 32.2 05/20/2024 2225   RDW 12.2 05/20/2024 2225   LYMPHSABS 1.2 05/20/2024 2225   MONOABS 0.5 05/20/2024 2225   EOSABS 0.0 05/20/2024 2225   BASOSABS 0.0 05/20/2024 2225     CMP     Component Value Date/Time   NA 136 05/20/2024 2228   NA 135 03/20/2024 1506   K 5.2 (H) 05/20/2024 2228   CL 107 05/20/2024 2227   CO2 10 (L) 05/20/2024 2225   GLUCOSE 563 (HH) 05/20/2024 2227   BUN 27 (H) 05/20/2024 2227   BUN 16 03/20/2024 1506   CREATININE 1.20 05/20/2024 2227   CALCIUM  8.6 (L) 05/20/2024 2225   PROT 7.8 05/20/2024 2225   PROT 6.0 03/20/2024 1506   ALBUMIN 3.7 05/20/2024 2225   ALBUMIN 3.8 (L) 03/20/2024 1506   AST 24 05/20/2024 2225   ALT 27 05/20/2024 2225   ALKPHOS 113 05/20/2024 2225   BILITOT 1.7 (H) 05/20/2024 2225   BILITOT 0.2 03/20/2024 1506   GFRNONAA 39 (L) 05/20/2024 2225   GFRAA >60 07/28/2020 2100     EKG: personally reviewed my interpretation is patient is in sinus tachycardia with right axis deviation. Prior EKG showed normal sinus rhythm with right axis deviation  ASSESSMENT & PLAN:   Assessment & Plan by Problem: Principal Problem:   DKA (diabetic ketoacidosis) (HCC) Active Problems:   Uncontrolled type 1 diabetes mellitus with hyperglycemia, with long-term current use of insulin  (HCC)   AKI (acute kidney injury) (HCC)   Hyperkalemia   Nonadherence to medication   Leukocytosis   Jacob [name] is a 33 y.o. person  living with a history of type I DM, MDD with psychosis, GAD who presented with elevated blood glucose levels and admitted for diabetic keto acidosis on hospital day 1     DKA (diabetic ketoacidosis) (HCC)   Uncontrolled type 1 diabetes mellitus with hyperglycemia, with long-term current use of insulin  (HCC) Patient was brought in by the EMS with CBG greater than 600 and altered mental status.  Patient nonadherent to insulin .  Pt's pH was 7.14, anion gap of 25, and elevated levels of beta hydroxy butyrate acid > 8 with bicarb of 11 and ketones present in urine.  His clinical symptoms and lab results were confirmatory for DKA due to non-adherence to insulin .  Patient admitted to progressive and started  on Endo tool. --Continue Endo tool --Trend BMP every 4 hours --When anion gap closes x 2 patient can be weaned to subQ --Monitor mental status closely, patient currently able to protect his own airway but still altered --Hold home insulin  while on Endotool --Monitor CBGs    AKI (acute kidney injury) (HCC) AKI with creatinine elevated at 2.22 on admission.  This likely in setting of dehydration. --Continue IV LR --Monitor BMP    Hyperkalemia Patient's potassium levels were slightly elevated to 5.6 -- No changes found on EKG -- Anticipate potassium levels to normalize with Endotool     Leukocytosis Patient had WBC levels elevated at 13. Likely reactive due to his DKA.  Does not seem to have any infectious symptoms, but when mentation improves, can ask about symptoms. --Monitor WBC levels--likely to improve as DKA resolves.    Schizoaffective disorder Home medications include Cogentin  0.5 mg twice daily, haloperidol  100 mg monthly, hydroxyzine  25 mg 3 times daily as needed for anxiety.  Unable to assess at this time.  Will hold medications at this time given patient is nonadherent to his medications. - Hold home medications    Nonadherence to medication Patient is not adherent to all his  medications.  He needs to be instructed to take insulin  on time as not taking it puts him at a risk for DKA or even death.  Best practice: Diet: NPO VTE: Lovenox  40 mg IVF: LR 949mL/hr Code: Full  Disposition planning: Prior to Admission Living Arrangement: [facility] Anticipated Discharge Location: [facility]  Dispo: Admit patient to Observation with expected length of stay less than 2 midnights.  Signed: Edgardo Pontiff, DO Internal Medicine Resident  05/21/2024, 3:30 AM  Please contact IM Residency On-Call Pager at: [phone] or [phone].

## 2024-05-21 NOTE — ED Notes (Signed)
 Insulin  drip stopped due to BGL 106 per endotool.

## 2024-05-21 NOTE — Progress Notes (Signed)
 Pt in hall attempting to leave hospital. I asked that he stay until I could speak with MD and he agreed. He went back into room but returned shortly pacing hallway. I did page on call but have not received a response. Patient has since left the floor. MD paged a second time to make them aware of the situation.

## 2024-05-24 ENCOUNTER — Ambulatory Visit: Payer: MEDICAID | Admitting: Student

## 2024-05-24 ENCOUNTER — Other Ambulatory Visit: Payer: Self-pay | Admitting: Student

## 2024-05-24 VITALS — BP 105/74 | HR 108 | Temp 98.2°F | Ht 69.0 in | Wt 128.0 lb

## 2024-05-24 DIAGNOSIS — F250 Schizoaffective disorder, bipolar type: Secondary | ICD-10-CM

## 2024-05-24 DIAGNOSIS — E10649 Type 1 diabetes mellitus with hypoglycemia without coma: Secondary | ICD-10-CM | POA: Diagnosis not present

## 2024-05-24 DIAGNOSIS — F17290 Nicotine dependence, other tobacco product, uncomplicated: Secondary | ICD-10-CM

## 2024-05-24 DIAGNOSIS — Z720 Tobacco use: Secondary | ICD-10-CM

## 2024-05-24 MED ORDER — ACCU-CHEK SOFTCLIX LANCETS MISC: 12 refills | Status: DC

## 2024-05-24 MED ORDER — ACCU-CHEK GUIDE W/DEVICE KIT: 1.0000 | PACK | 0 refills | Status: DC

## 2024-05-24 NOTE — Assessment & Plan Note (Signed)
 The patient's psychiatric conditions are currently managed with Cogentin  0.5 mg, Haldol  Decanoate 100 mg monthly injection, Hydroxyzine  25 mg daily, and Trazodone  50 mg daily. The patient was encouraged to follow up with behavioral health to reestablish care and continue receiving his medications as prescribed. He appeared to understand this plan and agreed to follow up with behavioral health services at the urgent care.

## 2024-05-24 NOTE — Patient Instructions (Addendum)
 Thank you, [name], for allowing us  to provide your care today. We discussed your recent hospitalization and the importance of managing your blood sugars effectively.  Please continue taking your Lantus  30 units daily and NovoLog  7 units with meals three times daily, as we reviewed during your visit.  Thank you for allowing us  to get some labs today.  I will call you once those labs come back.  I am sending you supplies for your Accu-Chek glucose monitor. It's important to check your blood sugars daily and bring your readings to your next appointment. Please plan to see [name] in the clinic in 2 weeks for follow-up.  Additionally, I recommend you follow up with behavioral health as soon as possible. You can visit an urgent care center to get your medication and arrange for regular follow-up appointments with behavioral health as needed.  I have ordered the following labs for you:  Lab Orders         Microalbumin / Creatinine Urine Ratio         Basic metabolic panel with GFR       Tests ordered today:    Referrals ordered today:   Referral Orders  No referral(s) requested today     I have ordered the following medication/changed the following medications:   Stop the following medications: There are no discontinued medications.   Start the following medications: Meds ordered this encounter  Medications   Accu-Chek Softclix Lancets lancets    Sig: Use as instructed    Dispense:  100 each    Refill:  12     Follow up:    Remember:   Should you have any questions or concerns please call the internal medicine clinic at [phone].   Drue Lisa Grow MD 05/24/2024, 4:39 PM   [facility]

## 2024-05-24 NOTE — Progress Notes (Unsigned)
 Follow up Hospitalization  Patient was admitted to IMTS on 7/20 and discharged on 7/21. He was treated for DKA,AKI,Hyperkalemia. Telephone follow up was done on 7/21 He reports good compliance with treatment. He reports this condition is resolved.  ----------------------------------------------------------------------------------------- -   HPI:  [name] is a 33 y.o. male living with a history stated below and presents today for hospital follow-up. Please see problem based assessment and plan for additional details.  Past Medical History:  Diagnosis Date   Bipolar 1 disorder (HCC)    History of attempted suicide 2019   Unsuccessful suicide attempt in 2019 with rat poison   Schizoaffective disorder (HCC)    Type 1 diabetes mellitus on insulin  therapy (HCC) 2019   Vitamin D  deficiency 01/03/2024    Current Outpatient Medications on File Prior to Visit  Medication Sig Dispense Refill   benztropine  (COGENTIN ) 0.5 MG tablet Take 1 tablet (0.5 mg total) by mouth 2 (two) times daily. For prevention of EPS (Patient not taking: Reported on 05/04/2024) 60 tablet 0   Continuous Glucose Sensor (DEXCOM G6 SENSOR) MISC Change site every 10 days. (Patient not taking: Reported on 05/04/2024) 3 each 3   haloperidol  decanoate (HALDOL  DECANOATE) 100 MG/ML injection Inject 1 mL (100 mg total) into the muscle every 30 (thirty) days. (Due on 03-09-24): For mood control (Patient not taking: Reported on 02/19/2024) 1 mL 0   hydrOXYzine  (ATARAX ) 25 MG tablet Take 25 mg by mouth 3 (three) times daily as needed for anxiety. (Patient not taking: Reported on 05/04/2024)     insulin  aspart (NOVOLOG  FLEXPEN) 100 UNIT/ML FlexPen Inject 7 Units into the skin 3 (three) times daily before meals. For diabetes control (Patient not taking: Reported on 05/04/2024) 15 mL 0   insulin  glargine (LANTUS  SOLOSTAR) 100 UNIT/ML Solostar Pen Inject 30 Units into the skin daily. For diabetes control. (Patient not taking:  Reported on 05/04/2024) 15 mL 0   traZODone  (DESYREL ) 50 MG tablet Take 50 mg by mouth at bedtime. (Patient not taking: Reported on 05/04/2024)     Vitamin D , Ergocalciferol , (DRISDOL ) 1.25 MG (50000 UNIT) CAPS capsule Take 1 capsule (50,000 Units total) by mouth every 7 (seven) days. (Patient not taking: Reported on 02/19/2024) 5 capsule 0   No current facility-administered medications on file prior to visit.    Family History  Problem Relation Age of Onset   Healthy Mother    Healthy Father     Social History   Socioeconomic History   Marital status: Single    Spouse name: Not on file   Number of children: Not on file   Years of education: Not on file   Highest education level: Not on file  Occupational History   Not on file  Tobacco Use   Smoking status: Former    Types: Cigars, Cigarettes   Smokeless tobacco: Never   Tobacco comments:    2 BLACK AND MILD A DAY  Vaping Use   Vaping status: Every Day  Substance and Sexual Activity   Alcohol use: Yes    Comment: social/occassional   Drug use: Not Currently    Types: Marijuana   Sexual activity: Not on file  Other Topics Concern   Not on file  Social History Narrative   Not on file   Social Drivers of Health   Financial Resource Strain: Low Risk  (05/24/2024)   Overall Financial Resource Strain (CARDIA)    Difficulty of Paying Living Expenses: Not very hard  Food Insecurity: Patient  Declined (05/24/2024)   Hunger Vital Sign    Worried About Running Out of Food in the Last Year: Patient declined    Ran Out of Food in the Last Year: Patient declined  Recent Concern: Food Insecurity - Food Insecurity Present (03/02/2024)   Hunger Vital Sign    Worried About Running Out of Food in the Last Year: Often true    Ran Out of Food in the Last Year: Often true  Transportation Needs: No Transportation Needs (05/24/2024)   PRAPARE - Administrator, Civil Service (Medical): No    Lack of Transportation (Non-Medical): No   Recent Concern: Transportation Needs - Unmet Transportation Needs (03/02/2024)   PRAPARE - Transportation    Lack of Transportation (Medical): Yes    Lack of Transportation (Non-Medical): Yes  Physical Activity: Patient Declined (05/24/2024)   Exercise Vital Sign    Days of Exercise per Week: Patient declined    Minutes of Exercise per Session: Patient declined  Stress: No Stress Concern Present (05/24/2024)   [employer] of Occupational Health - Occupational Stress Questionnaire    Feeling of Stress: Not at all  Social Connections: Unknown (05/24/2024)   Social Connection and Isolation Panel    Frequency of Communication with Friends and Family: Twice a week    Frequency of Social Gatherings with Friends and Family: Twice a week    Attends Religious Services: 1 to 4 times per year    Active Member of [employer] or Organizations: No    Attends Engineer, structural: 1 to 4 times per year    Marital Status: Patient unable to answer  Recent Concern: Social Connections - Socially Isolated (03/02/2024)   Social Connection and Isolation Panel    Frequency of Communication with Friends and Family: Never    Frequency of Social Gatherings with Friends and Family: Never    Attends Religious Services: Never    Database administrator or Organizations: No    Attends Banker Meetings: Never    Marital Status: Never married  Intimate Partner Violence: Not At Risk (05/24/2024)   Humiliation, Afraid, Rape, and Kick questionnaire    Fear of Current or Ex-Partner: No    Emotionally Abused: No    Physically Abused: No    Sexually Abused: No    Review of Systems: ROS negative except for what is noted on the assessment and plan.  Vitals:   05/24/24 1553  BP: 105/74  Pulse: (!) 108  Temp: 98.2 F (36.8 C)  TempSrc: Oral  SpO2: 95%  Weight: 128 lb (58.1 kg)  Height: 5' 9 (1.753 m)    Physical Exam: Constitutional: well-appearing man, sitting in chair  HENT: normocephalic  atraumatic, mucous membranes moist Cardiovascular: regular rate and rhythm, no m/r/g Pulmonary/Chest: normal work of breathing on room air, lungs clear to auscultation bilaterally Skin: warm and dry Psych: normal mood and behavior  Assessment & Plan:   Uncontrolled type 1 diabetes mellitus with hypoglycemia without coma [facility]) Mr. Laswell presented to the office today for follow-up after his recent hospitalization. He was admitted on June 20 for diabetic ketoacidosis  but unfortunately left against medical advice the following day. I'm glad he was able to follow up in the clinic today. Prior to discharge, his DKA, acute kidney injury , and hyperkalemia had all resolved, and he left the hospital in stable condition. The patient is able to clearly state his insulin  regimen and reports adherence to it since discharge. There are concerns  regarding his homelessness and whether he can safely keep his insulin  pens if he is sleeping on the street. We discussed this challenge, but options are limited at this time. I have referred him to the [facility] for further support addressing social determinants of health. I provided education on insulin  use and emphasized the importance of regularly checking his blood sugar. I also scheduled an appointment for him to see [name], our diabetes education coordinator, next week for further follow-up. His recent hemoglobin A1c was 14.9%, measured one week ago, so it was not repeated in the office today. He had not previously had a foot exam, which was completed today. I will order a urine microalbumin to assess kidney function and determine if he may benefit from an SGLT2 inhibitor. - Continuing Lantus  30 units daily - Continue NovoLog  7 units 3 times daily with meals - Prescribe Accu-Chek meter and supplies - Repeat hemoglobin A1c in 8 weeks - Foot exam - AUC  Schizoaffective disorder, bipolar type (HCC) The patient's psychiatric conditions are currently  managed with Cogentin  0.5 mg, Haldol  Decanoate 100 mg monthly injection, Hydroxyzine  25 mg daily, and Trazodone  50 mg daily. The patient was encouraged to follow up with behavioral health to reestablish care and continue receiving his medications as prescribed. He appeared to understand this plan and agreed to follow up with behavioral health services at the urgent care.       Patient discussed with Dr. Karna Drue Grow, M.D [facility] Phone: [phone] Date 05/24/2024 Time 5:03 PM

## 2024-05-24 NOTE — Assessment & Plan Note (Signed)
>>  ASSESSMENT AND PLAN FOR UNCONTROLLED TYPE 1 DIABETES MELLITUS WITH HYPOGLYCEMIA WITHOUT COMA (HCC) WRITTEN ON 05/24/2024  4:59 PM BY RENNE HOMANS, MD  Jacob [name] presented to the office today for follow-up after his recent hospitalization. He was admitted on June 20 for diabetic ketoacidosis  but unfortunately left against medical advice the following day. I'm glad he was able to follow up in the clinic today. Prior to discharge, his DKA, acute kidney injury , and hyperkalemia had all resolved, and he left the hospital in stable condition. The patient is able to clearly state his insulin  regimen and reports adherence to it since discharge. There are concerns regarding his homelessness and whether he can safely keep his insulin  pens if he is sleeping on the street. We discussed this challenge, but options are limited at this time. I have referred him to the [facility] for further support addressing social determinants of health. I provided education on insulin  use and emphasized the importance of regularly checking his blood sugar. I also scheduled an appointment for him to see [name], our diabetes education coordinator, next week for further follow-up. His recent hemoglobin A1c was 14.9%, measured one week ago, so it was not repeated in the office today. He had not previously had a foot exam, which was completed today. I will order a urine microalbumin to assess kidney function and determine if he may benefit from an SGLT2 inhibitor. - Continuing Lantus  30 units daily - Continue NovoLog  7 units 3 times daily with meals - Prescribe Accu-Chek meter and supplies - Repeat hemoglobin A1c in 8 weeks - Foot exam - AUC

## 2024-05-24 NOTE — Assessment & Plan Note (Signed)
 Jacob [name] presented to the office today for follow-up after his recent hospitalization. He was admitted on June 20 for diabetic ketoacidosis  but unfortunately left against medical advice the following day. I'm glad he was able to follow up in the clinic today. Prior to discharge, his DKA, acute kidney injury , and hyperkalemia had all resolved, and he left the hospital in stable condition. The patient is able to clearly state his insulin  regimen and reports adherence to it since discharge. There are concerns regarding his homelessness and whether he can safely keep his insulin  pens if he is sleeping on the street. We discussed this challenge, but options are limited at this time. I have referred him to the [facility] for further support addressing social determinants of health. I provided education on insulin  use and emphasized the importance of regularly checking his blood sugar. I also scheduled an appointment for him to see [name], our diabetes education coordinator, next week for further follow-up. His recent hemoglobin A1c was 14.9%, measured one week ago, so it was not repeated in the office today. He had not previously had a foot exam, which was completed today. I will order a urine microalbumin to assess kidney function and determine if he may benefit from an SGLT2 inhibitor. - Continuing Lantus  30 units daily - Continue NovoLog  7 units 3 times daily with meals - Prescribe Accu-Chek meter and supplies - Repeat hemoglobin A1c in 8 weeks - Foot exam - AUC

## 2024-05-25 LAB — BASIC METABOLIC PANEL WITH GFR
BUN/Creatinine Ratio: 15 (ref 9–20)
BUN: 14 mg/dL (ref 6–20)
CO2: 23 mmol/L (ref 20–29)
Calcium: 9.5 mg/dL (ref 8.7–10.2)
Chloride: 90 mmol/L — ABNORMAL LOW (ref 96–106)
Creatinine, Ser: 0.95 mg/dL (ref 0.76–1.27)
Glucose: 474 mg/dL — ABNORMAL HIGH (ref 70–99)
Potassium: 4.7 mmol/L (ref 3.5–5.2)
Sodium: 129 mmol/L — ABNORMAL LOW (ref 134–144)
eGFR: 109 mL/min/1.73 (ref >59)

## 2024-05-25 LAB — MICROALBUMIN / CREATININE URINE RATIO
Creatinine, Urine: 64.4 mg/dL
Microalb/Creat Ratio: <5 mg/g{creat} (ref 0–29)
Microalbumin, Urine: <3 ug/mL

## 2024-05-28 ENCOUNTER — Encounter (HOSPITAL_COMMUNITY): Payer: Self-pay

## 2024-05-28 ENCOUNTER — Emergency Department (HOSPITAL_COMMUNITY)
Admission: EM | Admit: 2024-05-28 | Discharge: 2024-05-28 | Disposition: A | Discharge status: Home / Self Care | Payer: MEDICAID | Attending: Emergency Medicine | Admitting: Emergency Medicine

## 2024-05-28 ENCOUNTER — Other Ambulatory Visit (HOSPITAL_COMMUNITY): Payer: Self-pay

## 2024-05-28 ENCOUNTER — Emergency Department (HOSPITAL_COMMUNITY): Payer: MEDICAID

## 2024-05-28 ENCOUNTER — Other Ambulatory Visit: Payer: Self-pay

## 2024-05-28 ENCOUNTER — Encounter (HOSPITAL_COMMUNITY): Payer: Self-pay | Admitting: Emergency Medicine

## 2024-05-28 ENCOUNTER — Ambulatory Visit (HOSPITAL_COMMUNITY)
Admission: EM | Admit: 2024-05-28 | Discharge: 2024-05-28 | Disposition: A | Discharge status: Home / Self Care | Payer: MEDICAID

## 2024-05-28 DIAGNOSIS — S0993XA Unspecified injury of face, initial encounter: Secondary | ICD-10-CM | POA: Diagnosis not present

## 2024-05-28 DIAGNOSIS — S0990XA Unspecified injury of head, initial encounter: Secondary | ICD-10-CM | POA: Insufficient documentation

## 2024-05-28 DIAGNOSIS — D72829 Elevated white blood cell count, unspecified: Secondary | ICD-10-CM | POA: Insufficient documentation

## 2024-05-28 DIAGNOSIS — E1165 Type 2 diabetes mellitus with hyperglycemia: Secondary | ICD-10-CM | POA: Insufficient documentation

## 2024-05-28 DIAGNOSIS — Z794 Long term (current) use of insulin: Secondary | ICD-10-CM | POA: Diagnosis not present

## 2024-05-28 DIAGNOSIS — R739 Hyperglycemia, unspecified: Secondary | ICD-10-CM

## 2024-05-28 DIAGNOSIS — W260XXA Contact with knife, initial encounter: Secondary | ICD-10-CM | POA: Diagnosis not present

## 2024-05-28 DIAGNOSIS — Z79899 Other long term (current) drug therapy: Secondary | ICD-10-CM | POA: Insufficient documentation

## 2024-05-28 LAB — BASIC METABOLIC PANEL WITH GFR
Anion gap: 12 (ref 5–15)
Anion gap: 9 (ref 5–15)
BUN: 10 mg/dL (ref 6–20)
BUN: 8 mg/dL (ref 6–20)
CO2: 24 mmol/L (ref 22–32)
CO2: 28 mmol/L (ref 22–32)
Calcium: 8.8 mg/dL — ABNORMAL LOW (ref 8.9–10.3)
Calcium: 8.7 mg/dL — ABNORMAL LOW (ref 8.9–10.3)
Chloride: 90 mmol/L — ABNORMAL LOW (ref 98–111)
Chloride: 98 mmol/L (ref 98–111)
Creatinine, Ser: 0.82 mg/dL (ref 0.61–1.24)
Creatinine, Ser: 0.63 mg/dL (ref 0.61–1.24)
GFR, Estimated: >60 mL/min (ref >60)
GFR, Estimated: >60 mL/min (ref >60)
Glucose, Bld: 620 mg/dL (ref 70–99)
Glucose, Bld: 155 mg/dL — ABNORMAL HIGH (ref 70–99)
Potassium: 5 mmol/L (ref 3.5–5.1)
Potassium: 3.6 mmol/L (ref 3.5–5.1)
Sodium: 126 mmol/L — ABNORMAL LOW (ref 135–145)
Sodium: 135 mmol/L (ref 135–145)

## 2024-05-28 LAB — CBG MONITORING, ED
Glucose-Capillary: 572 mg/dL (ref 70–99)
Glucose-Capillary: 542 mg/dL (ref 70–99)
Glucose-Capillary: 542 mg/dL (ref 70–99)
Glucose-Capillary: 267 mg/dL — ABNORMAL HIGH (ref 70–99)
Glucose-Capillary: 145 mg/dL — ABNORMAL HIGH (ref 70–99)
Glucose-Capillary: 73 mg/dL (ref 70–99)
Glucose-Capillary: 226 mg/dL — ABNORMAL HIGH (ref 70–99)

## 2024-05-28 LAB — URINALYSIS, ROUTINE W REFLEX MICROSCOPIC
Bacteria, UA: NONE SEEN
Bilirubin Urine: NEGATIVE
Glucose, UA: >=500 mg/dL — AB
Hgb urine dipstick: NEGATIVE
Ketones, ur: 5 mg/dL — AB
Leukocytes,Ua: NEGATIVE
Nitrite: NEGATIVE
Protein, ur: NEGATIVE mg/dL
Specific Gravity, Urine: 1.025 (ref 1.005–1.030)
pH: 8 (ref 5.0–8.0)

## 2024-05-28 LAB — I-STAT VENOUS BLOOD GAS, ED
Acid-Base Excess: 7 mmol/L — ABNORMAL HIGH (ref 0.0–2.0)
Bicarbonate: 31.6 mmol/L — ABNORMAL HIGH (ref 20.0–28.0)
Calcium, Ion: 1.05 mmol/L — ABNORMAL LOW (ref 1.15–1.40)
HCT: 39 % (ref 39.0–52.0)
Hemoglobin: 13.3 g/dL (ref 13.0–17.0)
O2 Saturation: 59 %
Potassium: 4.8 mmol/L (ref 3.5–5.1)
Sodium: 126 mmol/L — ABNORMAL LOW (ref 135–145)
TCO2: 33 mmol/L — ABNORMAL HIGH (ref 22–32)
pCO2, Ven: 42.9 mmHg — ABNORMAL LOW (ref 44–60)
pH, Ven: 7.474 — ABNORMAL HIGH (ref 7.25–7.43)
pO2, Ven: 29 mmHg — CL (ref 32–45)

## 2024-05-28 LAB — CBC
HCT: 38.7 % — ABNORMAL LOW (ref 39.0–52.0)
Hemoglobin: 12.5 g/dL — ABNORMAL LOW (ref 13.0–17.0)
MCH: 28.7 pg (ref 26.0–34.0)
MCHC: 32.3 g/dL (ref 30.0–36.0)
MCV: 89 fL (ref 80.0–100.0)
Platelets: 426 K/uL — ABNORMAL HIGH (ref 150–400)
RBC: 4.35 MIL/uL (ref 4.22–5.81)
RDW: 12.3 % (ref 11.5–15.5)
WBC: 13.2 K/uL — ABNORMAL HIGH (ref 4.0–10.5)
nRBC: 0 % (ref 0.0–0.2)

## 2024-05-28 LAB — BETA-HYDROXYBUTYRIC ACID: Beta-Hydroxybutyric Acid: 0.65 mmol/L — ABNORMAL HIGH (ref 0.05–0.27)

## 2024-05-28 MED ORDER — BLOOD GLUCOSE MONITOR SYSTEM W/DEVICE KIT
1.0000 | PACK | Freq: Three times a day (TID) | 0 refills | Status: AC
  Filled 2024-05-28: qty 1, 30d supply, fill #0
  Filled 2024-09-27: qty 1, fill #0

## 2024-05-28 MED ORDER — LACTATED RINGERS IV BOLUS
1000.0000 mL | INTRAVENOUS | Status: AC
  Administered 2024-05-28 (×2): 1000 mL via INTRAVENOUS

## 2024-05-28 MED ORDER — DEXTROSE IN LACTATED RINGERS 5 % IV SOLN: INTRAVENOUS | Status: DC

## 2024-05-28 MED ORDER — INSULIN ASPART 100 UNIT/ML IJ SOLN
10.0000 [IU] | Freq: Once | INTRAMUSCULAR | Status: AC
  Administered 2024-05-28: 10 [IU] via SUBCUTANEOUS

## 2024-05-28 MED ORDER — DEXTROSE 50 % IV SOLN: 0.0000 mL | INTRAVENOUS | Status: DC | PRN

## 2024-05-28 MED ORDER — BLOOD GLUCOSE TEST VI STRP
1.0000 | ORAL_STRIP | Freq: Three times a day (TID) | 0 refills | Status: DC
  Filled 2024-05-28: qty 100, 30d supply, fill #0

## 2024-05-28 MED ORDER — INSULIN ASPART 100 UNIT/ML IJ SOLN
20.0000 [IU] | Freq: Once | INTRAMUSCULAR | Status: AC
  Administered 2024-05-28: 20 [IU] via SUBCUTANEOUS

## 2024-05-28 MED ORDER — LACTATED RINGERS IV BOLUS
20.0000 mL/kg | Freq: Once | INTRAVENOUS | Status: AC
  Administered 2024-05-28: 1180 mL via INTRAVENOUS

## 2024-05-28 MED ORDER — POTASSIUM CHLORIDE CRYS ER 20 MEQ PO TBCR
40.0000 meq | EXTENDED_RELEASE_TABLET | Freq: Once | ORAL | Status: AC
  Administered 2024-05-28: 40 meq via ORAL
  Filled 2024-05-28: qty 2

## 2024-05-28 MED ORDER — INSULIN REGULAR(HUMAN) IN NACL 100-0.9 UT/100ML-% IV SOLN
INTRAVENOUS | Status: DC
  Administered 2024-05-28: 8.5 [IU]/h via INTRAVENOUS
  Filled 2024-05-28: qty 100

## 2024-05-28 MED ORDER — BACITRACIN ZINC 500 UNIT/GM EX OINT
TOPICAL_OINTMENT | Freq: Once | CUTANEOUS | Status: AC
  Administered 2024-05-28: 1 via TOPICAL
  Filled 2024-05-28: qty 0.9

## 2024-05-28 MED ORDER — ACCU-CHEK SOFTCLIX LANCETS MISC
1.0000 | Freq: Three times a day (TID) | 0 refills | Status: DC
  Filled 2024-05-28: qty 100, 30d supply, fill #0
  Filled 2024-09-27: qty 100, 34d supply, fill #0

## 2024-05-28 MED ORDER — LANCET DEVICE MISC
1.0000 | Freq: Three times a day (TID) | 0 refills | Status: AC
  Filled 2024-05-28: qty 1, 30d supply, fill #0

## 2024-05-28 NOTE — ED Notes (Signed)
 Patient transported to CT
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
## 2024-05-28 NOTE — Care Management CC44 (Signed)
 Met with patient to discuss assists with obtaining a glucometer from [facility] pharmacy. Patient has his insulin  but has already received his one time meter from the [facility] pharmacy,  he would not be eligible for another one. The pharmacist was able to locate that a meter was sent to [facility] on [address] which was never picked up.  Patient states he can go there today to pick it up. Patient was given a bus pass. Updated Nursing Staff. No further ED RNCM needs identified.

## 2024-05-28 NOTE — Progress Notes (Signed)
 Internal Medicine Clinic Attending  Case discussed with the resident at the time of the visit.  We reviewed the resident's history and exam and pertinent patient test results.  I agree with the assessment, diagnosis, and plan of care documented in the resident's note.

## 2024-05-28 NOTE — Discharge Instructions (Addendum)
 Continue your insulin  as previously directed, you were recently hospitalized for diabetic ketoacidosis, it is important that you continue your medications and follow-up with your primary care provider in regard to your diabetes.  Continue to apply ice to your right eye to help with swelling, you may take Tylenol /ibuprofen  as needed for pain.

## 2024-05-28 NOTE — ED Notes (Signed)
 Pt asking for crackers informed hm we are awaiting results.

## 2024-05-28 NOTE — ED Notes (Signed)
 Patient is being discharged from the Urgent Care and sent to the Emergency Department via POV . Per Rilla, NP, patient is in need of higher level of care due to a head injury and needing additional imaging. Patient is aware and verbalizes understanding of plan of care.  Vitals:   05/28/24 0826  BP: 123/75  Pulse: 80  Resp: 17  Temp: 97.8 F (36.6 C)  SpO2: 95%

## 2024-05-28 NOTE — ED Notes (Addendum)
 Endotool order to clamp insulin , recheck CBG @ 1311

## 2024-05-28 NOTE — ED Notes (Signed)
 Crackers provided for CBG 73. MD aware

## 2024-05-28 NOTE — Discharge Instructions (Signed)
 Go to Er for further evaluation of eye, facial head trauma
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
## 2024-05-28 NOTE — ED Notes (Signed)
 Informed RN to pt not bein on leads, she informed them of the wait for TOC, Apolinar SW stated we wil have no SW till 5p. Verified w/ RN that pt being off leads currently is ok .

## 2024-06-04 NOTE — Progress Notes (Signed)
 Coding clarification in regards to his tachycardia, tachypnea and leukocytosis: These Findings inherent to DKA and not clinically significant

## 2024-06-07 ENCOUNTER — Encounter: Payer: MEDICAID | Admitting: Dietician

## 2024-06-11 ENCOUNTER — Telehealth: Payer: Self-pay

## 2024-06-11 NOTE — Telephone Encounter (Signed)
 Do you mean today? Sure that is fine.

## 2024-06-11 NOTE — Telephone Encounter (Signed)
 Patient's mother is wanting to bring him at 11:15 am instead of 10:15 am.  Instructed patient I would forward request to [name] for the approval.

## 2024-06-11 NOTE — Telephone Encounter (Signed)
 Appt date is on 06/20/2024

## 2024-06-13 ENCOUNTER — Encounter: Payer: MEDICAID | Admitting: Dietician

## 2024-06-16 ENCOUNTER — Encounter (HOSPITAL_COMMUNITY): Payer: Self-pay | Admitting: Radiology

## 2024-06-16 ENCOUNTER — Emergency Department (HOSPITAL_COMMUNITY): Payer: MEDICAID

## 2024-06-16 ENCOUNTER — Other Ambulatory Visit: Payer: Self-pay

## 2024-06-16 ENCOUNTER — Observation Stay (HOSPITAL_COMMUNITY)
Admission: EM | Admit: 2024-06-16 | Discharge: 2024-06-17 | Disposition: A | Discharge status: Home / Self Care | Payer: MEDICAID | Attending: Internal Medicine | Admitting: Internal Medicine

## 2024-06-16 DIAGNOSIS — N179 Acute kidney failure, unspecified: Secondary | ICD-10-CM | POA: Diagnosis not present

## 2024-06-16 DIAGNOSIS — E1010 Type 1 diabetes mellitus with ketoacidosis without coma: Secondary | ICD-10-CM

## 2024-06-16 DIAGNOSIS — S90821A Blister (nonthermal), right foot, initial encounter: Secondary | ICD-10-CM | POA: Insufficient documentation

## 2024-06-16 DIAGNOSIS — D72829 Elevated white blood cell count, unspecified: Secondary | ICD-10-CM | POA: Diagnosis present

## 2024-06-16 DIAGNOSIS — Z794 Long term (current) use of insulin: Secondary | ICD-10-CM | POA: Diagnosis not present

## 2024-06-16 DIAGNOSIS — Z91148 Patient's other noncompliance with medication regimen for other reason: Secondary | ICD-10-CM

## 2024-06-16 DIAGNOSIS — E875 Hyperkalemia: Secondary | ICD-10-CM | POA: Diagnosis not present

## 2024-06-16 DIAGNOSIS — Z79899 Other long term (current) drug therapy: Secondary | ICD-10-CM | POA: Diagnosis not present

## 2024-06-16 DIAGNOSIS — I4891 Unspecified atrial fibrillation: Secondary | ICD-10-CM

## 2024-06-16 DIAGNOSIS — R110 Nausea: Secondary | ICD-10-CM | POA: Diagnosis present

## 2024-06-16 DIAGNOSIS — R112 Nausea with vomiting, unspecified: Secondary | ICD-10-CM

## 2024-06-16 DIAGNOSIS — E860 Dehydration: Secondary | ICD-10-CM

## 2024-06-16 DIAGNOSIS — E1110 Type 2 diabetes mellitus with ketoacidosis without coma: Principal | ICD-10-CM | POA: Insufficient documentation

## 2024-06-16 DIAGNOSIS — E0810 Diabetes mellitus due to underlying condition with ketoacidosis without coma: Principal | ICD-10-CM

## 2024-06-16 DIAGNOSIS — E1065 Type 1 diabetes mellitus with hyperglycemia: Secondary | ICD-10-CM | POA: Diagnosis present

## 2024-06-16 LAB — CBC WITH DIFFERENTIAL/PLATELET
Basophils Absolute: 0 K/uL (ref 0.0–0.1)
Basophils Relative: 0 %
Eosinophils Absolute: 0 K/uL (ref 0.0–0.5)
Eosinophils Relative: 0 %
HCT: 51.7 % (ref 39.0–52.0)
Hemoglobin: 16.1 g/dL (ref 13.0–17.0)
Lymphocytes Relative: 4 %
Lymphs Abs: 1.3 K/uL (ref 0.7–4.0)
MCH: 29.1 pg (ref 26.0–34.0)
MCHC: 31.1 g/dL (ref 30.0–36.0)
MCV: 93.5 fL (ref 80.0–100.0)
Monocytes Absolute: 2.6 K/uL — ABNORMAL HIGH (ref 0.1–1.0)
Monocytes Relative: 8 %
Neutro Abs: 29 K/uL — ABNORMAL HIGH (ref 1.7–7.7)
Neutrophils Relative %: 88 %
Platelets: 575 K/uL — ABNORMAL HIGH (ref 150–400)
RBC: 5.53 MIL/uL (ref 4.22–5.81)
RDW: 12.6 % (ref 11.5–15.5)
WBC: 33 K/uL — ABNORMAL HIGH (ref 4.0–10.5)
nRBC: 0 % (ref 0.0–0.2)

## 2024-06-16 LAB — URINALYSIS, ROUTINE W REFLEX MICROSCOPIC
Bacteria, UA: NONE SEEN
Bilirubin Urine: NEGATIVE
Glucose, UA: >=500 mg/dL — AB
Ketones, ur: 80 mg/dL — AB
Leukocytes,Ua: NEGATIVE
Nitrite: NEGATIVE
Protein, ur: 30 mg/dL — AB
Specific Gravity, Urine: 1.013 (ref 1.005–1.030)
pH: 5 (ref 5.0–8.0)

## 2024-06-16 LAB — COMPREHENSIVE METABOLIC PANEL WITH GFR
ALT: 39 U/L (ref 0–44)
AST: 35 U/L (ref 15–41)
Albumin: 4.6 g/dL (ref 3.5–5.0)
Alkaline Phosphatase: 111 U/L (ref 38–126)
BUN: 30 mg/dL — ABNORMAL HIGH (ref 6–20)
CO2: <7 mmol/L — ABNORMAL LOW (ref 22–32)
Calcium: 9.6 mg/dL (ref 8.9–10.3)
Chloride: 89 mmol/L — ABNORMAL LOW (ref 98–111)
Creatinine, Ser: 2.77 mg/dL — ABNORMAL HIGH (ref 0.61–1.24)
GFR, Estimated: 30 mL/min — ABNORMAL LOW (ref >60)
Glucose, Bld: 740 mg/dL (ref 70–99)
Potassium: 6.1 mmol/L — ABNORMAL HIGH (ref 3.5–5.1)
Sodium: 131 mmol/L — ABNORMAL LOW (ref 135–145)
Total Bilirubin: 2.2 mg/dL — ABNORMAL HIGH (ref 0.0–1.2)
Total Protein: 8.7 g/dL — ABNORMAL HIGH (ref 6.5–8.1)

## 2024-06-16 LAB — I-STAT VENOUS BLOOD GAS, ED
Acid-base deficit: 21 mmol/L — ABNORMAL HIGH (ref 0.0–2.0)
Bicarbonate: 6.5 mmol/L — ABNORMAL LOW (ref 20.0–28.0)
Calcium, Ion: 1.08 mmol/L — ABNORMAL LOW (ref 1.15–1.40)
HCT: 46 % (ref 39.0–52.0)
Hemoglobin: 15.6 g/dL (ref 13.0–17.0)
O2 Saturation: 85 %
Potassium: 5.8 mmol/L — ABNORMAL HIGH (ref 3.5–5.1)
Sodium: 129 mmol/L — ABNORMAL LOW (ref 135–145)
TCO2: 7 mmol/L — ABNORMAL LOW (ref 22–32)
pCO2, Ven: 21.4 mmHg — ABNORMAL LOW (ref 44–60)
pH, Ven: 7.093 — CL (ref 7.25–7.43)
pO2, Ven: 66 mmHg — ABNORMAL HIGH (ref 32–45)

## 2024-06-16 LAB — CBG MONITORING, ED
Glucose-Capillary: 179 mg/dL — ABNORMAL HIGH (ref 70–99)
Glucose-Capillary: 196 mg/dL — ABNORMAL HIGH (ref 70–99)
Glucose-Capillary: 234 mg/dL — ABNORMAL HIGH (ref 70–99)
Glucose-Capillary: 396 mg/dL — ABNORMAL HIGH (ref 70–99)
Glucose-Capillary: 425 mg/dL — ABNORMAL HIGH (ref 70–99)
Glucose-Capillary: >600 mg/dL (ref 70–99)

## 2024-06-16 LAB — BETA-HYDROXYBUTYRIC ACID: Beta-Hydroxybutyric Acid: >8 mmol/L — ABNORMAL HIGH (ref 0.05–0.27)

## 2024-06-16 LAB — I-STAT CHEM 8, ED
BUN: 31 mg/dL — ABNORMAL HIGH (ref 6–20)
Calcium, Ion: 1.08 mmol/L — ABNORMAL LOW (ref 1.15–1.40)
Chloride: 101 mmol/L (ref 98–111)
Creatinine, Ser: 1.7 mg/dL — ABNORMAL HIGH (ref 0.61–1.24)
Glucose, Bld: >700 mg/dL (ref 70–99)
HCT: 48 % (ref 39.0–52.0)
Hemoglobin: 16.3 g/dL (ref 13.0–17.0)
Potassium: 5.8 mmol/L — ABNORMAL HIGH (ref 3.5–5.1)
Sodium: 130 mmol/L — ABNORMAL LOW (ref 135–145)
TCO2: 8 mmol/L — ABNORMAL LOW (ref 22–32)

## 2024-06-16 LAB — I-STAT ARTERIAL BLOOD GAS, ED
Acid-base deficit: 11 mmol/L — ABNORMAL HIGH (ref 0.0–2.0)
Bicarbonate: 14.1 mmol/L — ABNORMAL LOW (ref 20.0–28.0)
Calcium, Ion: 1.28 mmol/L (ref 1.15–1.40)
HCT: 40 % (ref 39.0–52.0)
Hemoglobin: 13.6 g/dL (ref 13.0–17.0)
O2 Saturation: 97 %
Patient temperature: 97.7
Potassium: 4.5 mmol/L (ref 3.5–5.1)
Sodium: 136 mmol/L (ref 135–145)
TCO2: 15 mmol/L — ABNORMAL LOW (ref 22–32)
pCO2 arterial: 26.8 mmHg — ABNORMAL LOW (ref 32–48)
pH, Arterial: 7.325 — ABNORMAL LOW (ref 7.35–7.45)
pO2, Arterial: 96 mmHg (ref 83–108)

## 2024-06-16 LAB — I-STAT CG4 LACTIC ACID, ED
Lactic Acid, Venous: 6 mmol/L (ref 0.5–1.9)
Lactic Acid, Venous: 1.7 mmol/L (ref 0.5–1.9)

## 2024-06-16 MED ORDER — LACTATED RINGERS IV BOLUS: 1000.0000 mL | Freq: Once | INTRAVENOUS | Status: DC

## 2024-06-16 MED ORDER — LACTATED RINGERS IV SOLN: INTRAVENOUS | Status: DC

## 2024-06-16 MED ORDER — LACTATED RINGERS IV BOLUS: 20.0000 mL/kg | Freq: Once | INTRAVENOUS | Status: DC

## 2024-06-16 MED ORDER — LACTATED RINGERS IV BOLUS
2000.0000 mL | Freq: Once | INTRAVENOUS | Status: AC
  Administered 2024-06-16: 2000 mL via INTRAVENOUS

## 2024-06-16 MED ORDER — LACTATED RINGERS IV BOLUS
1000.0000 mL | Freq: Once | INTRAVENOUS | Status: AC
  Administered 2024-06-16: 1000 mL via INTRAVENOUS

## 2024-06-16 MED ORDER — DEXTROSE IN LACTATED RINGERS 5 % IV SOLN: INTRAVENOUS | Status: DC

## 2024-06-16 MED ORDER — DEXTROSE 50 % IV SOLN: 0.0000 mL | INTRAVENOUS | Status: DC | PRN

## 2024-06-16 MED ORDER — ENOXAPARIN SODIUM 40 MG/0.4ML IJ SOSY
40.0000 mg | PREFILLED_SYRINGE | INTRAMUSCULAR | Status: DC
  Administered 2024-06-17: 40 mg via SUBCUTANEOUS
  Filled 2024-06-16: qty 0.4

## 2024-06-16 MED ORDER — INSULIN REGULAR(HUMAN) IN NACL 100-0.9 UT/100ML-% IV SOLN
INTRAVENOUS | Status: DC
  Administered 2024-06-16: 7 [IU]/h via INTRAVENOUS
  Filled 2024-06-16: qty 100

## 2024-06-16 NOTE — ED Notes (Signed)
 Abnormal results for lactic,vbg and cm 8 given to kirk b.rn by at

## 2024-06-16 NOTE — Hospital Course (Addendum)
#  Diabetic Ketoacidosis #Uncontrolled Type 1 Diabetes Mellitus on insulin  #Anion Gap metabolic acidosis  Patient has a history of Type I DM and significant history of hospitalizations presenting in DKA. Patient presented in DKA on 8/16, A1C 14.9 on 05/03/2024. On presentation, blood sugar was 740. Anion gap of 35, pH  of 7.093, bicarb of less than 7. BHB >8. He reported that he does have plenty of insulin  at home, but he had missed taking it on 8/15, though patient reported that he did take his insulin  on 8/16 day of admission. Other etiologies of DKA considered included infarction, infectious, iatrogenic, toxins. CXR and UA without evidence of infection, and patient denied any recent sick symptoms, so do not believe infectious source was contributing to his DKA. EKG did show baseline borderline ST elevations that have been present before on admission, though improved on most recent, so do not believe any ischemic causes are contributing to presentation. No history of recent steroid use we are aware of. Patient denied recent alcohol or other drug use. Suspect that patient's DKA is due to medication non-adherence following missed insulin  dose day prior on 8/15. Home insulin  regimen of insulin  glargine 30 units daily and insulin  lispro 7 units TID before large meals. He was initiated on EndoTool, D5, LR and then had to be transitioned to D10 in the setting of blood sugars < 100. He was monitored on q4 BMP, and was found to have two labs that showed improvement of AG to 11, and again to 10. After improvement of AG, patient was resumed on regular diet which he tolerated well. He was transitioned to home dosing of insulin  glargine 30 units, and EndoTool was stopped 2 hours afterward. He felt ready for discharge by mid-day.    #Leukocytosis  Patient presented with leukocytosis to 33. CBC showed improvement of WBC to 18.8 following initiation of EndoTool. CXR did not show active disease and UA without leukocytes,  nitrites or bacteria. Patient denied any recent sick symptoms including urinary symptoms, fever, chills, cough, sore throat. Blood cultures obtained in the ED showed no growth x12 hours. Suspect that leukocytosis was reactive in the setting of DKA.  - f/u final blood cultures    #Hyperkalemia, resolved BMP with potassium of 6.1 on admission, EKG initially showed peaked T-waves. Potassium downtrended to 4.5 on i-STAT and 4.0 on most recent BMP. Most recent EKG with normal sinus rhythm.    #Lactic Acidosis, resolved  Lactate on presentation of 6 and downtrended to 1.7. Improvement after receiving IVF.   #Acute Kidney Injury, resolved  Creatinine elevated at 2.77 on admission. Baseline is normally . 8-.9. Suspect prerenal etiology secondary to osmotic diuresis in the setting of DKA. BMP following initiation of EndoTool, showed improvement and complete resolution to 0.93.   #Transient Atrial flutter, resolved Transient A-flutter was noted on initial EKG in the ED. Likely transient due to acute illness. No history noted of this before. On physical exam and most recent EKG was in normal sinus rhythm.     #MDD with psychosis  #Schizoaffective disorder, bipolar type  #Generalized Anxiety Disorder  From last admission patient was sent home on Trazodone  50 mg, Haldol  injection, and Hydroxyzine  25 mg 3 times daily. Patient denied any SI/HI. Patient reports his last Haldol  injection was completed 8/16 by the ACT Team.

## 2024-06-16 NOTE — ED Provider Triage Note (Signed)
 Emergency Medicine Provider Triage Evaluation Note  Jacob [name] , a 33 y.o. male  was evaluated in triage.  Pt complains of abdominal pain and vomiting.  Patient with history of type 1 diabetes that is poorly controlled.  Patient states that he has been having difficulty taking his medications regularly.  He is concerned for dehydration.  He is alert and oriented and answering questions appropriately.  Review of Systems  Positive: As above Negative: As above  Physical Exam  There were no vitals taken for this visit. Gen:   Awake, no distress   Resp:  Normal effort  MSK:   Moves extremities without difficulty  Other:    Medical Decision Making  Medically screening exam initiated at 5:42 PM.  Appropriate orders placed.  Alfie Yale was informed that the remainder of the evaluation will be completed by another provider, this initial triage assessment does not replace that evaluation, and the importance of remaining in the ED until their evaluation is complete.     Somara Frymire A, [name] 06/16/24 1743

## 2024-06-16 NOTE — H&P (Addendum)
 Date: 06/16/2024               Patient Name:  Jacob [name] MRN: [id number]  DOB: January 21, 1991 Age / Sex: 33 y.o., male   PCP: Napoleon Limes, MD         Medical Service: Internal Medicine Teaching Service         Attending Physician: Dr. Trudy, Mliss Dragon, MD      First Contact: Jacob Miyamoto, MD       Second Contact: Jacob Africa, DO                     SUBJECTIVE   Chief Complaint: high blood sugars   History of Present Illness: Jacob [name] is a 33 year old male with PMH of Uncontrolled Type 1 Diabetes Mellitus on insulin , generalized anxiety disorder, schizoaffective disorder-bipolar type, MDD with psychosis and housing instability presents with high blood sugars and was brought in by his mother. No family at bedside. Patient was very sleepy at the time of evaluation and had to be awakened for each question until he stopped answering questions. Patient was also like this in the ED lobby, and had to be sternal rubbed to answer questions.  Due to this history is limited. Majority of information from chart review.  Of note patient was last hospitalized from 7/20 to 7/21 for DKA and leukocytosis when he left against medical advice. Patient's DKA, AKI was resolved at the time he left the hospital and he had been transitioned from endotool to home insulin  regimen. He was sent home on lantus  30 units and aspart 7 units 3 times daily. At the time of office visit on 7/24 patient stated that he was adherent to insulin  regimen. He did have an appointment scheduled for diabetes education coordinator but unsure if he was able to make this appointment.  Patient reported to triage that he felt like he was in DKA at the time of presentation to the hospital. He states that his blood sugars have been running high. Unsure if he had been able to pick up supplies to be able to check blood sugar after last office visit. Patient denies any nausea, vomiting , or diarrhea. He states that he last took  his insulin  this morning, but was not able to answer what insulin  regimen he was on at this time. He also stated that the last time he was able to eat solid food and drink any fluids was also this morning.  He also denied fever/chills. Patient does have history of MDD with psychosis and schizoaffective disorder- bipolar type and denies SI and HI. He also denied any alcohol or other substance use.  .  Review of Systems: A complete ROS was negative except as per HPI.   ED Course: BP 134/93 P 55 RR 18 SPO2 100% on room air  ED provider said heart rates went up to 170s and looked to be in aflutter but went back into sinus rhythm with fluids given VBG: ph 7.093 pCO2 21.4 Bicarb 6.5 on VBG and <7 on BMP ABG: pH 7.325 pCO2 26.8 Bicarb 14.1  Lactic Acid 6 --> 1.7  AG: 35.5 and corrected of 34  Glucose:740 K 6.1  Cr 2.77 ( baseline .8-.9)  WBC 33 PLT 575  Beta Hydroxybutyric Acid  >8.00  CXR: No active disease  UA: > 500 glucose, 80 ketones 30 protein EKG: Initial EKG showed aflutter, with peaked t waves rate of 167 Most recent shows sinus rhythm with peaked t  waves and rate of  97    Past Medical History: Uncontrolled Type 1 Diabetes Mellitus on insulin  GAD schizoaffective disorder-bipolar type MDD with psychosis  housing instability  Meds:  No outpatient medications have been marked as taking for the 06/16/24 encounter [facility] Encounter).  *Unsure if patient is taking these as he was not able to answer questions for us *  Benztropine  . 5 mg BID  Haloperidol  injection  Hydroxyzine  25 mg TID Ibuprofen  400 mg twice daily  Novolog  7 units 3 times daily  Glargine 30 units daily  Allergies: Allergies as of 06/16/2024   (No Known Allergies)    History reviewed. No pertinent surgical history.  Social:  Lives With: [facility]  Occupation: Unemployed* from last admission on 7/20)  Support: does not receive adequate support Level of Function: unable to assess at this time   PCP:Dr.Smucker at [facility] Substances: Tobacco: denies Alcohol: denies   Family History:  Family History  Problem Relation Age of Onset   Healthy Mother    Healthy Father      OBJECTIVE:   Physical Exam: Blood pressure (!) 135/93, pulse 99, temperature 97.7 F (36.5 C), temperature source Oral, resp. rate 15, height 5' 9 (1.753 m), weight 59 kg, SpO2 100%.  Physical Exam Constitutional:      Comments: Lethargic   Cardiovascular:     Rate and Rhythm: Normal rate.     Pulses: Normal pulses.     Heart sounds: No murmur heard.    Comments: Palpable DP pulses  Pulmonary:     Effort: Pulmonary effort is normal. No respiratory distress.     Breath sounds: No wheezing.  Abdominal:     Palpations: Abdomen is soft.     Tenderness: There is no abdominal tenderness.  Musculoskeletal:     Right lower leg: No edema.     Left lower leg: No edema.  Skin:    General: Skin is warm.     Comments: 1 x 1 cm recently scarred wound near lateral malleolus, well healed  Right heel scabbed blister, no drainage or erythema noted.  Callous noted under middle toe on right foot and one mid sole  One callous noted on left foot. No drainage or erythema  Psychiatric:     Comments: Constricted affect Unable to assess mood No SI/HI       Labs:    Latest Ref Rng & Units 06/16/2024    9:08 PM 06/16/2024    6:36 PM 06/16/2024    6:35 PM  CBC  Hemoglobin 13.0 - 17.0 g/dL 86.3  83.6  84.3   Hematocrit 39.0 - 52.0 % 40.0  48.0  46.0        Latest Ref Rng & Units 06/16/2024    9:08 PM 06/16/2024    6:36 PM 06/16/2024    6:35 PM  CMP  Glucose 70 - 99 mg/dL  >299    BUN 6 - 20 mg/dL  31    Creatinine 9.38 - 1.24 mg/dL  8.29    Sodium 864 - 854 mmol/L 136  130  129   Potassium 3.5 - 5.1 mmol/L 4.5  5.8  5.8   Chloride 98 - 111 mmol/L  101       Imaging: DG Chest Port 1 View Result Date: 06/16/2024 CLINICAL DATA:  Diabetic ketoacidosis. EXAM: PORTABLE CHEST 1 VIEW COMPARISON:  Radiograph  02/18/2024 FINDINGS: The cardiomediastinal contours are normal. The lungs are clear. Pulmonary vasculature is normal. No consolidation, pleural effusion, or pneumothorax. No  acute osseous abnormalities are seen. IMPRESSION: No active disease. Electronically Signed   By: Andrea Gasman M.D.   On: 06/16/2024 18:42        ASSESSMENT & PLAN:   Assessment & Plan by Problem: Principal Problem:   Diabetic ketoacidosis (HCC) Active Problems:   Blister (nonthermal), right foot, initial encounter   Acute kidney injury (HCC)   Hyperkalemia   Leukocytosis   Jacob [name] is a 33 year old male with PMH of Uncontrolled Type 1 Diabetes Mellitus on insulin , generalized anxiety disorder, schizoaffective disorder-bipolar type, MDD with psychosis and housing instability presents with elevated blood sugars and admitted for diabetic ketoacidosis management.   #Diabetic Ketoacidosis #Uncontrolled Type 1 Diabetes Mellitus on insulin  #Anion Gap metabolic acidosis  Patient presents with evidence of DKA. On labs blood sugar was 740. Anion gap of 35,pH  of 7.093, bicarb of less than 7 on initial. BHB >8.Suspect that patient's DKA is due to medication non compliance due to this history of non compliance. Etiologies considered including infarction, infectious, iatrogenic. CXR and UA were clear of infection, so do not believe infectious source is contributing to his DKA. EKG did show baseline borderline ST elevations that have been present before, do not believe any ischemic causes are contributing to presentation. No history of recent steroid use we are aware of.  Due to patient's lethargy, and eventual frustration where he stopped answering questions, history was limited and mostly gathered from chart review.  Do note that since starting patient on endotool and fluids, labs have been improving which is reassuring. Last A1c 14.9 on 05/03/24 - Continue on Endotool,d5, and LR.  - Will monitor BMP q4 hours and monitor for  anion gap closing and normalizing of potassium. Once anion gap is <12 x2, will transition to home dose insulin  as long as patient is eating.  - Continue NPO    #Acute Kidney Injury  -Creatinine elevated at 2.77 on admission. Baseline is normally . 8-.9. Although patient reports that he was able to eat and drink this morning, with improvement with fluids, unsure if he was getting good PO intake  - likely prerenal due to osmotic diuresis from DKA.   #Leukocytosis  - WBC 33 patient denied any urinary symptoms or cough, fever, chills - CXR does not show any active disease and UA does not show leukocytes, nitrites or bacteria  -blood cultures collected and pending  - Could be reactive in the setting of DKA. Continue to monitor for downtrend   #Hyperkalemia - was 6.1 on admission. Trended down to 4.5 on I stat, will continue to monitor with BMP for improvement   #lactic acidosis, resolved  -initially 6 and trended down to 1.7. Improvement with IVF   #Transient Atrial flutter, resolved - was noted on initial EKG in the ED. Likely transient due to acute illness. No history noted of this before. On exam and most recent EKG was in NSR   #MDD with psychosis  #Schizoaffective disorder, bipolar type  #Generalized Anxiety Disorder  - From last admission patient was sent home on trazodone  50 mg, haldol  injection, hydroxyzine  25 mg 3 times daily. Unsure if he is currently taking these medications as patient stopped answering our questions during evaluation. Pharmacy tech attempted to go get med rec and was not able to get patient will respond. Will need to get further confirmation of what he is on before restarting. Patient denied any SI/HI.   Diet: NPO VTE: Enoxaparin  Code: Full Patient refused to discuss code status.  Patient elected that mother make that decision. Discussed decision with mother and she elected for full code.   Prior to Admission Living Arrangement: [facility]  Anticipated  Discharge Location: [facility]  Barriers to Discharge: Medical management, improvement of blood sugars   Dispo: Admit patient to Inpatient with expected length of stay greater than 2 midnights.  Signed:  Tylek Boney D'Mello, DO PGY-1  Internal Medicine Teaching Service  06/16/2024, 10:59 PM   Please contact the on call pager at [phone]

## 2024-06-16 NOTE — ED Notes (Signed)
 Pt found sleeping on ED lobby chair. When sternal rubbed pt responded and when asked about when the last time pt had his sugar checked he stated he could not remember. Pt sugar above 600. Triage RN aware.

## 2024-06-16 NOTE — ED Triage Notes (Signed)
 Pt endorses he hasn't been checking his blood sugar at home.
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
## 2024-06-16 NOTE — ED Triage Notes (Signed)
 Pt states he began feeling bad this morning. Pt having N/V/D. Pt has had multiple visits for DKA which is what he feels like is happening now.

## 2024-06-16 NOTE — ED Notes (Signed)
 Pt called for triage with no response.

## 2024-06-17 LAB — CBG MONITORING, ED
Glucose-Capillary: 100 mg/dL — ABNORMAL HIGH (ref 70–99)
Glucose-Capillary: 114 mg/dL — ABNORMAL HIGH (ref 70–99)
Glucose-Capillary: 130 mg/dL — ABNORMAL HIGH (ref 70–99)
Glucose-Capillary: 133 mg/dL — ABNORMAL HIGH (ref 70–99)
Glucose-Capillary: 142 mg/dL — ABNORMAL HIGH (ref 70–99)
Glucose-Capillary: 146 mg/dL — ABNORMAL HIGH (ref 70–99)
Glucose-Capillary: 161 mg/dL — ABNORMAL HIGH (ref 70–99)
Glucose-Capillary: 174 mg/dL — ABNORMAL HIGH (ref 70–99)
Glucose-Capillary: 200 mg/dL — ABNORMAL HIGH (ref 70–99)
Glucose-Capillary: 237 mg/dL — ABNORMAL HIGH (ref 70–99)
Glucose-Capillary: 242 mg/dL — ABNORMAL HIGH (ref 70–99)
Glucose-Capillary: 76 mg/dL (ref 70–99)

## 2024-06-17 LAB — BASIC METABOLIC PANEL WITH GFR
Anion gap: 16 — ABNORMAL HIGH (ref 5–15)
Anion gap: 11 (ref 5–15)
Anion gap: 10 (ref 5–15)
Anion gap: 7 (ref 5–15)
BUN: 19 mg/dL (ref 6–20)
BUN: 17 mg/dL (ref 6–20)
BUN: 12 mg/dL (ref 6–20)
BUN: 11 mg/dL (ref 6–20)
CO2: 18 mmol/L — ABNORMAL LOW (ref 22–32)
CO2: 24 mmol/L (ref 22–32)
CO2: 23 mmol/L (ref 22–32)
CO2: 25 mmol/L (ref 22–32)
Calcium: 9.1 mg/dL (ref 8.9–10.3)
Calcium: 9.1 mg/dL (ref 8.9–10.3)
Calcium: 9 mg/dL (ref 8.9–10.3)
Calcium: 8.7 mg/dL — ABNORMAL LOW (ref 8.9–10.3)
Chloride: 104 mmol/L (ref 98–111)
Chloride: 106 mmol/L (ref 98–111)
Chloride: 101 mmol/L (ref 98–111)
Chloride: 98 mmol/L (ref 98–111)
Creatinine, Ser: 1.55 mg/dL — ABNORMAL HIGH (ref 0.61–1.24)
Creatinine, Ser: 1.25 mg/dL — ABNORMAL HIGH (ref 0.61–1.24)
Creatinine, Ser: 0.93 mg/dL (ref 0.61–1.24)
Creatinine, Ser: 1.06 mg/dL (ref 0.61–1.24)
GFR, Estimated: >60 mL/min (ref >60)
GFR, Estimated: >60 mL/min (ref >60)
GFR, Estimated: >60 mL/min (ref >60)
GFR, Estimated: >60 mL/min (ref >60)
Glucose, Bld: 205 mg/dL — ABNORMAL HIGH (ref 70–99)
Glucose, Bld: 103 mg/dL — ABNORMAL HIGH (ref 70–99)
Glucose, Bld: 125 mg/dL — ABNORMAL HIGH (ref 70–99)
Glucose, Bld: 233 mg/dL — ABNORMAL HIGH (ref 70–99)
Potassium: 5 mmol/L (ref 3.5–5.1)
Potassium: 4 mmol/L (ref 3.5–5.1)
Potassium: 3.7 mmol/L (ref 3.5–5.1)
Potassium: 3.5 mmol/L (ref 3.5–5.1)
Sodium: 138 mmol/L (ref 135–145)
Sodium: 141 mmol/L (ref 135–145)
Sodium: 134 mmol/L — ABNORMAL LOW (ref 135–145)
Sodium: 130 mmol/L — ABNORMAL LOW (ref 135–145)

## 2024-06-17 LAB — CBC
HCT: 37 % — ABNORMAL LOW (ref 39.0–52.0)
Hemoglobin: 12.8 g/dL — ABNORMAL LOW (ref 13.0–17.0)
MCH: 29.1 pg (ref 26.0–34.0)
MCHC: 34.6 g/dL (ref 30.0–36.0)
MCV: 84.1 fL (ref 80.0–100.0)
Platelets: 435 K/uL — ABNORMAL HIGH (ref 150–400)
RBC: 4.4 MIL/uL (ref 4.22–5.81)
RDW: 12.5 % (ref 11.5–15.5)
WBC: 18.8 K/uL — ABNORMAL HIGH (ref 4.0–10.5)
nRBC: 0 % (ref 0.0–0.2)

## 2024-06-17 LAB — I-STAT VENOUS BLOOD GAS, ED
Acid-base deficit: 23 mmol/L — ABNORMAL HIGH (ref 0.0–2.0)
Bicarbonate: 6.7 mmol/L — ABNORMAL LOW (ref 20.0–28.0)
Calcium, Ion: 1.11 mmol/L — ABNORMAL LOW (ref 1.15–1.40)
HCT: 52 % (ref 39.0–52.0)
Hemoglobin: 17.7 g/dL — ABNORMAL HIGH (ref 13.0–17.0)
O2 Saturation: 41 %
Potassium: 5.5 mmol/L — ABNORMAL HIGH (ref 3.5–5.1)
Sodium: 128 mmol/L — ABNORMAL LOW (ref 135–145)
TCO2: 7 mmol/L — ABNORMAL LOW (ref 22–32)
pCO2, Ven: 25.4 mmHg — ABNORMAL LOW (ref 44–60)
pH, Ven: 7.029 — CL (ref 7.25–7.43)
pO2, Ven: 33 mmHg (ref 32–45)

## 2024-06-17 LAB — BETA-HYDROXYBUTYRIC ACID: Beta-Hydroxybutyric Acid: 4.61 mmol/L — ABNORMAL HIGH (ref 0.05–0.27)

## 2024-06-17 MED ORDER — DEXTROSE 10 % IV SOLN: INTRAVENOUS | Status: AC

## 2024-06-17 MED ORDER — DEXTROSE 50 % IV SOLN
1.0000 | Freq: Once | INTRAVENOUS | Status: AC
  Administered 2024-06-17: 50 mL via INTRAVENOUS
  Filled 2024-06-17: qty 50

## 2024-06-17 MED ORDER — POTASSIUM CHLORIDE 20 MEQ PO PACK
40.0000 meq | PACK | Freq: Once | ORAL | Status: AC
  Administered 2024-06-17: 40 meq via ORAL
  Filled 2024-06-17: qty 2

## 2024-06-17 MED ORDER — INSULIN GLARGINE 100 UNIT/ML ~~LOC~~ SOLN
30.0000 [IU] | Freq: Every day | SUBCUTANEOUS | Status: DC
  Administered 2024-06-17: 30 [IU] via SUBCUTANEOUS
  Filled 2024-06-17: qty 0.3

## 2024-06-17 NOTE — ED Notes (Signed)
 Cbg recheck was 100, endo tool suggested to turn insulin  back on however after speaking with provider in secure chat they want to hold off and recheck the cbg in 30 minutes.

## 2024-06-17 NOTE — ED Notes (Signed)
 Room is not ready. Still being cleaned. Thanks

## 2024-06-17 NOTE — Discharge Instructions (Addendum)
 To [name] or their caretakers,  They were admitted to [facility] on 06/16/2024 for evaluation and treatment of: diabetic ketoacidosis      They were treated with insulin , IV fluids, and close monitoring.  They were discharged from the hospital on 06/17/24. I recommend the following after leaving the hospital:   Make Sure You Take These Medications:  1) TAKE insulin  glargine: 30 units daily   2) TAKE insulin  lispro: 7 units three times daily before large meals    Home Medications Continued During Hospital Admission without Changes:  1) Continue all other medications as instructed.    Our clinic will call you to schedule a PCP follow up for after you leave the hospital.    Follow-up Information     Napoleon Limes, MD. Call.   Specialty: Internal Medicine Why: Call to make an appointment with your PCP within ~1 week after discharge from the hospital. Contact information: [address] [place] [address] [phone]                   For questions about your care plan, until you are able to see your primary doctor: Call [phone]. Dial  0 for the operator. Ask for the internal medicine resident on call.  Thank you for allowing us  to be part of your care.   Doyal Miyamoto, MD 06/17/2024, 3:33 PM

## 2024-06-17 NOTE — Progress Notes (Addendum)
 HD#1 SUBJECTIVE:  Patient Summary: Jacob [name] is a 33 year old male with a history of Uncontrolled Type 1 Diabetes Mellitus on insulin , generalized anxiety disorder, schizoaffective disorder-bipolar type, MDD with psychosis and housing instability, who presents with elevated blood sugars and admitted for diabetic ketoacidosis management.   Overnight Events: None  Interim History:  Patient somnolent but able to be aroused and answer questions. He denies any pain, fever, chills, recent cough, sore throat or feeling otherwise sick recently. He notes that he last took his insulin  yesterday, 8/16, but he did miss his insulin  dose the day prior on 8/15. He notes that he takes insulin  30 units daily and 7 units before meals. He states that he does not need any refills and has plenty of insulin  at home. He also states that his last Haldol  injection was yesterday 8/16 which was performed by the ACT team.   OBJECTIVE:  Vital Signs: Vitals:   06/17/24 0400 06/17/24 0415 06/17/24 0430 06/17/24 0700  BP: 107/78 113/83 122/81 116/79  Pulse: 85 84 80 75  Resp: 19 11 11 14   Temp:    97.8 F (36.6 C)  TempSrc:    Oral  SpO2: 100% 100% 100% 100%  Weight:      Height:       Supplemental O2: Room Air SpO2: 100 %  Filed Weights   06/16/24 1752  Weight: 59 kg    No intake or output data in the 24 hours ending 06/17/24 0741 Net IO Since Admission: No IO data has been entered for this period [06/17/24 0741]  Physical Exam:  Constitutional: tired-appearing male lying in hospital bed, in no acute distress HEENT: normocephalic atraumatic, mucous membranes dry, small right forehead scab without active bleeding or drainage  Cardiovascular: regular rate and rhythm, bilateral radial pulses 2+, bilateral dorsal pedal pulses 2+, brisk capillary refill bilateral feet and hands, no lower extremity edema  Pulmonary/Chest: normal work of breathing on room air, lungs clear to auscultation  bilaterally Abdominal: soft, non-tender, non-distended MSK: normal bulk and tone. Neurological: tired, but alert & oriented x 3 Skin: warm and dry; small LLE wound without erythema, drainage or bleeding ~3 cm superior to lateral malleolus; sole of right foot with 3 closed, non-draining, non-bleeding, non-discolored small papular lesions with callous appearance; left calcaneal healing blister without drainage or erythema  Psych: mood calm, behavior normal, thought content normal, judgement normal    Patient Lines/Drains/Airways Status     Active Line/Drains/Airways     Name Placement date Placement time Site Days   Peripheral IV 06/16/24 18 G 1.16 Anterior;Right;Upper Arm 06/16/24  1814  Arm  1   Peripheral IV 06/16/24 20 G 1 Left;Posterior Forearm 06/16/24  1817  Forearm  1   Peripheral IV 06/16/24 20 G 1 Anterior;Right Forearm 06/16/24  1834  Forearm  1   Wound 05/21/24 1745 Pressure Injury Heel Left Deep Tissue Pressure Injury - Purple or maroon localized area of discolored intact skin or blood-filled blister due to damage of underlying soft tissue from pressure and/or shear. 05/21/24  1745  Heel  27   Wound 05/21/24 1745 Pressure Injury Heel Right Deep Tissue Pressure Injury - Purple or maroon localized area of discolored intact skin or blood-filled blister due to damage of underlying soft tissue from pressure and/or shear. 05/21/24  1745  Heel  27            Pertinent labs and imaging:      Latest Ref Rng & Units 06/17/2024  4:39 AM 06/16/2024    9:08 PM 06/16/2024    6:36 PM  CBC  WBC 4.0 - 10.5 K/uL 18.8     Hemoglobin 13.0 - 17.0 g/dL 87.1  86.3  83.6   Hematocrit 39.0 - 52.0 % 37.0  40.0  48.0   Platelets 150 - 400 K/uL 435          Latest Ref Rng & Units 06/17/2024    4:39 AM 06/17/2024   12:30 AM 06/16/2024    9:08 PM  CMP  Glucose 70 - 99 mg/dL 896  794    BUN 6 - 20 mg/dL 17  19    Creatinine 9.38 - 1.24 mg/dL 8.74  8.44    Sodium 864 - 145 mmol/L 141  138   136   Potassium 3.5 - 5.1 mmol/L 4.0  5.0  4.5   Chloride 98 - 111 mmol/L 106  104    CO2 22 - 32 mmol/L 24  18    Calcium  8.9 - 10.3 mg/dL 9.1  9.1      DG Chest Port 1 View Result Date: 06/16/2024 CLINICAL DATA:  Diabetic ketoacidosis. EXAM: PORTABLE CHEST 1 VIEW COMPARISON:  Radiograph 02/18/2024 FINDINGS: The cardiomediastinal contours are normal. The lungs are clear. Pulmonary vasculature is normal. No consolidation, pleural effusion, or pneumothorax. No acute osseous abnormalities are seen. IMPRESSION: No active disease. Electronically Signed   By: Andrea Gasman M.D.   On: 06/16/2024 18:42    ASSESSMENT/PLAN:  Assessment: Principal Problem:   Diabetic ketoacidosis (HCC) Active Problems:   AKI (acute kidney injury) (HCC)   Blister of right foot   Acute kidney injury (HCC)   Hyperkalemia   Leukocytosis  Jacob [name] is a 33 year old male with a history of Uncontrolled Type 1 Diabetes Mellitus on insulin , generalized anxiety disorder, schizoaffective disorder-bipolar type, MDD with psychosis and housing instability, who presents with elevated blood sugars and admitted for diabetic ketoacidosis management.   Plan: #Diabetic Ketoacidosis #Uncontrolled Type 1 Diabetes Mellitus on insulin  #Anion Gap metabolic acidosis  Patient presented in DKA, A1C 14.9 on 05/03/2024. On presentation, blood sugar was 740. Anion gap of 35, pH  of 7.093, bicarb of less than 7. BHB >8. He reports that he does have plenty of insulin  at home, but he had missed taking it on 8/15, though patient reports that he did take his insulin  on 8/16 day of admission. Other etiologies considered including infarction, infectious, iatrogenic, toxins. CXR and UA without evidence of infection, and patient denied any recent sick symptoms, so do not believe infectious source is contributing to his DKA. EKG did show baseline borderline ST elevations that have been present before on admission, though improved on most recent, so do  not believe any ischemic causes are contributing to presentation. No history of recent steroid use we are aware of. Patient denies recent alcohol or other drug use. Home insulin  regimen of insulin  glargine 30 units daily and insulin  lispro 7 units TID before large meals. Suspect that patient's DKA is due to medication non-adherence following missed insulin  dose day prior on 8/15.  - Continue on Endotool, D10, and LR - BMP q4 hours, once AG is <12 x2, will transition to home dose insulin  as long as patient is eating  AG #1 @ 0439 of 11 - Continue NPO while on EndoTool   #Acute Kidney Injury  Creatinine elevated at 2.77 on admission. Baseline is normally . 8-.9. Suspect prerenal etiology secondary to osmotic diuresis in the setting of  DKA. Most recent BMP following initiation of EndoTool, shows improvement to 1.25. Will continue to monitor.  - BMP    #Leukocytosis  Patient presented with leukocytosis to 33. Most recent WBC shows improvement to 18.8 following initiation of EndoTool. CXR did not show active disease and UA without leukocytes, nitrites or bacteria. Patient denied any recent sick symptoms including urinary symptoms, fever, chills, cough, sore throat. Blood cultures obtained in the ED pending, though suspect that leukocytosis is reactive in the setting of DKA. Will continue to monitor.  - CBC    #Hyperkalemia, resolved BMP with potassium of 6.1 on admission, EKG initially showed peaked T-waves. Potassium downtrended to 4.5 on i-STAT and 4.0 on most recent BMP. Most recent EKG with normal sinus rhythm.    #lactic acidosis, resolved  Lactate on presentation of 6 and downtrended to 1.7. Improvement after receiving IVF.    #Transient Atrial flutter, resolved Transient A-flutter was noted on initial EKG in the ED. Likely transient due to acute illness. No history noted of this before. On physical exam and most recent EKG was in normal sinus rhythm.     #MDD with psychosis   #Schizoaffective disorder, bipolar type  #Generalized Anxiety Disorder  From last admission patient was sent home on trazodone  50 mg, haldol  injection, hydroxyzine  25 mg 3 times daily. Patient denied any SI/HI. Patient reports his last Haldol  injection was completed 8/16 by the ACT Team.   Best Practice: Diet: NPO while on EndoTool IVF: Fluids: LR, D10, Rate: 125 cc/hr x 8 hrs, on EndoTool VTE: enoxaparin  (LOVENOX ) injection 40 mg Start: 06/17/24 1000 Code: Full  Disposition planning: DISPO: Anticipated discharge in 1-2  days to [facility] pending DKA management currently on EndoTool.  Signature:  Doyal Miyamoto, MD [name]  7:41 AM, 06/17/2024  On Call pager [phone]

## 2024-06-17 NOTE — Progress Notes (Addendum)
 Patient received an amp of dextrose  due to persistently lower glucose levels while being treated for DKA. Patient's glucose remained lower at 100 after endotool was held for an hour with repeat glucose at 76 after endotool was held for a total of two hours. During this time, his anion gap had only closed one time to 11. To continue appropriate therapy for DKA, an ampule of dextrose  was provided with an improvement in glucose to 200. During this time, patient also remained on D5LR.   With improvement in glucose to 200 and withOUT closing anion gap 2x, will change D5LR to D10 and restart endotool since glucose is at 200. Will plan to transition off endotool once gap has closed 2x. BMP is due to be checked in 2 hours.   Damien Lease, DO Internal Medicine Resident: PGY-2 Please contact the on call pager at: [phone]

## 2024-06-17 NOTE — Discharge Summary (Signed)
 Name: Jacob [name] MRN: [id number] DOB: [birth year] 33 y.o. PCP: Napoleon Limes, MD  Date of Admission: 06/16/2024  5:21 PM Date of Discharge: 06/17/2024 Attending Physician: Dr. Mliss Pouch  Discharge Diagnosis: 1. Principal Problem:   Diabetic ketoacidosis (HCC) Active Problems:   AKI (acute kidney injury) (HCC)   Blister of right foot   Acute kidney injury (HCC)   Hyperkalemia   Leukocytosis    Discharge Medications: Allergies as of 06/17/2024   No Known Allergies      Medication List     TAKE these medications    Accu-Chek Softclix Lancets lancets 1 each in the morning, at noon, and at bedtime. May substitute to any manufacturer covered by patient's insurance.   benztropine  0.5 MG tablet Commonly known as: COGENTIN  Take 1 tablet (0.5 mg total) by mouth 2 (two) times daily. For prevention of EPS   Blood Glucose Monitor System w/Device Kit Use in the morning, at noon, and at bedtime.   BLOOD GLUCOSE TEST STRIPS Strp Use in the morning, at noon, and at bedtime.   haloperidol  decanoate 100 MG/ML injection Commonly known as: HALDOL  DECANOATE Inject 1 mL (100 mg total) into the muscle every 30 (thirty) days. (Due on 03-09-24): For mood control   hydrOXYzine  25 MG tablet Commonly known as: ATARAX  Take 25 mg by mouth 3 (three) times daily as needed for anxiety.   ibuprofen  200 MG tablet Commonly known as: ADVIL  Take 400 mg by mouth 2 (two) times daily as needed for headache or moderate pain (pain score 4-6).   Lancet Device Misc 1 each by Does not apply route in the morning, at noon, and at bedtime. May substitute to any manufacturer covered by patient's insurance.   Lantus  SoloStar 100 UNIT/ML Solostar Pen Generic drug: insulin  glargine Inject 30 Units into the skin daily. For diabetes control. What changed: how much to take   NovoLOG  FlexPen 100 UNIT/ML FlexPen Generic drug: insulin  aspart Inject 7 Units into the skin 3 (three) times daily before  meals. For diabetes control   traZODone  50 MG tablet Commonly known as: DESYREL  Take 50 mg by mouth at bedtime.        Disposition and follow-up:   [name] was discharged from [facility] in Good condition.  At the hospital follow up visit please address:  [  ] 1.  Patient advised to resume insulin  glargine 30 units daily and insulin  lispro 7 units TID before large meals. Please ensure he is appropriately taking his medications.   2.  Labs / imaging needed at time of follow-up: CBC, resolution of leukocytosis   3.  Pending labs/ test needing follow-up: Final blood cultures  Follow-up Appointments:  Follow-up Information     Napoleon Limes, MD. Call.   Specialty: Internal Medicine Why: Call to make an appointment with your PCP within ~1 week after discharge from the hospital. Contact information: [address] [place] [address] [phone]                  Hospital Course by problem list: Mick Amend is a 33 year old male with a history of Uncontrolled Type 1 Diabetes Mellitus on insulin , generalized anxiety disorder, schizoaffective disorder-bipolar type, MDD with psychosis and housing instability, who presents with elevated blood sugars and admitted for diabetic ketoacidosis management, now being discharged on hospital day 1 with the following pertinent hospital course:  #Diabetic Ketoacidosis #Uncontrolled Type 1 Diabetes Mellitus on insulin  #Anion Gap metabolic acidosis  Patient has a history of Type I DM and significant history of hospitalizations presenting in DKA. Patient presented in DKA on 8/16, A1C 14.9 on 05/03/2024. On presentation, blood sugar was 740. Anion gap of 35, pH  of 7.093, bicarb of less than 7. BHB >8. He reported that he does have plenty of insulin  at home, but he had missed taking it on 8/15, though patient reported that he did take his insulin  on 8/16 day of admission. Other etiologies of DKA  considered included infarction, infectious, iatrogenic, toxins. CXR and UA without evidence of infection, and patient denied any recent sick symptoms, so do not believe infectious source was contributing to his DKA. EKG did show baseline borderline ST elevations that have been present before on admission, though improved on most recent, so do not believe any ischemic causes are contributing to presentation. No history of recent steroid use we are aware of. Patient denied recent alcohol or other drug use. Suspect that patient's DKA is due to medication non-adherence following missed insulin  dose day prior on 8/15. Home insulin  regimen of insulin  glargine 30 units daily and insulin  lispro 7 units TID before large meals. He was initiated on EndoTool, D5, LR and then had to be transitioned to D10 in the setting of blood sugars < 100. He was monitored on q4 BMP, and was found to have two labs that showed improvement of AG to 11, and again to 10. After improvement of AG, patient was resumed on regular diet which he tolerated well. He was transitioned to home dosing of insulin  glargine 30 units, and EndoTool was stopped 2 hours afterward. He felt ready for discharge by mid-day.    #Leukocytosis  Patient presented with leukocytosis to 33. CBC showed improvement of WBC to 18.8 following initiation of EndoTool. CXR did not show active disease and UA without leukocytes, nitrites or bacteria. Patient denied any recent sick symptoms including urinary symptoms, fever, chills, cough, sore throat. Blood cultures obtained in the ED showed no growth x12 hours. Suspect that leukocytosis was reactive in the setting of DKA.  - f/u final blood cultures    #Hyperkalemia, resolved BMP with potassium of 6.1 on admission, EKG initially showed peaked T-waves. Potassium downtrended to 4.5 on i-STAT and 4.0 on most recent BMP. Most recent EKG with normal sinus rhythm.    #Lactic Acidosis, resolved  Lactate on presentation of 6 and  downtrended to 1.7. Improvement after receiving IVF.   #Acute Kidney Injury, resolved  Creatinine elevated at 2.77 on admission. Baseline is normally . 8-.9. Suspect prerenal etiology secondary to osmotic diuresis in the setting of DKA. BMP following initiation of EndoTool, showed improvement and complete resolution to 0.93.   #Transient Atrial flutter, resolved Transient A-flutter was noted on initial EKG in the ED. Likely transient due to acute illness. No history noted of this before. On physical exam and most recent EKG was in normal sinus rhythm.     #MDD with psychosis  #Schizoaffective disorder, bipolar type  #Generalized Anxiety Disorder  From last admission patient was sent home on Trazodone  50 mg, Haldol  injection, and Hydroxyzine  25 mg 3 times daily. Patient denied any SI/HI. Patient reports his last Haldol  injection was completed 8/16 by the ACT Team.     Subjective Patient somnolent but able to be aroused and answer questions. He denies any pain, fever, chills, recent cough, sore throat or feeling otherwise sick recently. He notes that he last took his insulin  yesterday, 8/16, but he did miss his insulin   dose the day prior on 8/15. He notes that he takes insulin  30 units daily and 7 units before meals. He states that he does not need any refills and has plenty of insulin  at home. He also states that his last Haldol  injection was yesterday 8/16 which was performed by the ACT team. By mid-day, patient was more responsive, with improvement of his overall symptoms, feeling ready for discharge.   Discharge Exam:   BP 118/69 (BP Location: Right Arm)   Pulse 83   Temp 98.9 F (37.2 C)   Resp 11   Ht 5' 9 (1.753 m)   Wt 59 kg   SpO2 100%   BMI 19.20 kg/m   Physical Exam:   Constitutional: tired-appearing male lying in hospital bed, in no acute distress HEENT: normocephalic atraumatic, mucous membranes dry, small right forehead scab without active bleeding or drainage   Cardiovascular: regular rate and rhythm, bilateral radial pulses 2+, bilateral dorsal pedal pulses 2+, brisk capillary refill bilateral feet and hands, no lower extremity edema  Pulmonary/Chest: normal work of breathing on room air, lungs clear to auscultation bilaterally Abdominal: soft, non-tender, non-distended MSK: normal bulk and tone. Neurological: tired, but alert & oriented x 3 Skin: warm and dry; small LLE wound without erythema, drainage or bleeding ~3 cm superior to lateral malleolus; sole of right foot with 3 closed, non-draining, non-bleeding, non-discolored small papular lesions with callous appearance; left calcaneal healing blister without drainage or erythema  Psych: mood calm, behavior normal, thought content normal, judgement normal    Pertinent Labs, Studies, and Procedures:     Latest Ref Rng & Units 06/17/2024    4:39 AM 06/16/2024    9:08 PM 06/16/2024    6:36 PM  CBC  WBC 4.0 - 10.5 K/uL 18.8     Hemoglobin 13.0 - 17.0 g/dL 87.1  86.3  83.6   Hematocrit 39.0 - 52.0 % 37.0  40.0  48.0   Platelets 150 - 400 K/uL 435          Latest Ref Rng & Units 06/17/2024   12:10 PM 06/17/2024   10:00 AM 06/17/2024    4:39 AM  CMP  Glucose 70 - 99 mg/dL 766  874  896   BUN 6 - 20 mg/dL 11  12  17    Creatinine 0.61 - 1.24 mg/dL 8.93  9.06  8.74   Sodium 135 - 145 mmol/L 130  134  141   Potassium 3.5 - 5.1 mmol/L 3.5  3.7  4.0   Chloride 98 - 111 mmol/L 98  101  106   CO2 22 - 32 mmol/L 25  23  24    Calcium  8.9 - 10.3 mg/dL 8.7  9.0  9.1     DG Chest Port 1 View Result Date: 06/16/2024 CLINICAL DATA:  Diabetic ketoacidosis. EXAM: PORTABLE CHEST 1 VIEW COMPARISON:  Radiograph 02/18/2024 FINDINGS: The cardiomediastinal contours are normal. The lungs are clear. Pulmonary vasculature is normal. No consolidation, pleural effusion, or pneumothorax. No acute osseous abnormalities are seen. IMPRESSION: No active disease. Electronically Signed   By: Andrea Gasman M.D.   On:  06/16/2024 18:42     Discharge Instructions: Discharge Instructions     Diet - low sodium heart healthy   Complete by: As directed    Increase activity slowly   Complete by: As directed          Discharge Instructions      To Mr. Aravind Chrismer or their caretakers,  They were admitted  to [facility] on 06/16/2024 for evaluation and treatment of: diabetic ketoacidosis      They were treated with insulin , IV fluids, and close monitoring.  They were discharged from the hospital on 06/17/24. I recommend the following after leaving the hospital:   Make Sure You Take These Medications:  1) TAKE insulin  glargine: 30 units daily   2) TAKE insulin  lispro: 7 units three times daily before large meals    Home Medications Continued During Hospital Admission without Changes:  1) Continue all other medications as instructed.    Our clinic will call you to schedule a PCP follow up for after you leave the hospital.    Follow-up Information     Napoleon Limes, MD. Call.   Specialty: Internal Medicine Why: Call to make an appointment with your PCP within ~1 week after discharge from the hospital. Contact information: [address] [place] [address] [phone]                   For questions about your care plan, until you are able to see your primary doctor: Call [phone]. Dial  0 for the operator. Ask for the internal medicine resident on call.  Thank you for allowing us  to be part of your care.   Doyal Miyamoto, MD 06/17/2024, 3:33 PM      Signed: Doyal Miyamoto, MD 06/17/2024, 7:39 PM

## 2024-06-20 ENCOUNTER — Telehealth: Payer: Self-pay | Admitting: Dietician

## 2024-06-20 ENCOUNTER — Encounter: Payer: MEDICAID | Admitting: Dietician

## 2024-06-20 NOTE — Telephone Encounter (Signed)
 Call to patient/mother about their appointment today.

## 2024-06-21 ENCOUNTER — Emergency Department (HOSPITAL_COMMUNITY): Payer: MEDICAID

## 2024-06-21 ENCOUNTER — Encounter (HOSPITAL_COMMUNITY): Payer: Self-pay

## 2024-06-21 ENCOUNTER — Other Ambulatory Visit: Payer: Self-pay

## 2024-06-21 ENCOUNTER — Observation Stay (HOSPITAL_COMMUNITY)
Admission: EM | Admit: 2024-06-21 | Discharge: 2024-06-22 | Disposition: A | Discharge status: Home / Self Care | Payer: MEDICAID | Attending: Internal Medicine | Admitting: Internal Medicine

## 2024-06-21 ENCOUNTER — Encounter: Payer: MEDICAID | Admitting: Dietician

## 2024-06-21 DIAGNOSIS — F323 Major depressive disorder, single episode, severe with psychotic features: Secondary | ICD-10-CM | POA: Insufficient documentation

## 2024-06-21 DIAGNOSIS — F411 Generalized anxiety disorder: Secondary | ICD-10-CM | POA: Insufficient documentation

## 2024-06-21 DIAGNOSIS — F1090 Alcohol use, unspecified, uncomplicated: Secondary | ICD-10-CM | POA: Insufficient documentation

## 2024-06-21 DIAGNOSIS — E1065 Type 1 diabetes mellitus with hyperglycemia: Secondary | ICD-10-CM | POA: Insufficient documentation

## 2024-06-21 DIAGNOSIS — F17210 Nicotine dependence, cigarettes, uncomplicated: Secondary | ICD-10-CM | POA: Insufficient documentation

## 2024-06-21 DIAGNOSIS — E871 Hypo-osmolality and hyponatremia: Secondary | ICD-10-CM | POA: Diagnosis not present

## 2024-06-21 DIAGNOSIS — G9341 Metabolic encephalopathy: Secondary | ICD-10-CM

## 2024-06-21 DIAGNOSIS — F1290 Cannabis use, unspecified, uncomplicated: Secondary | ICD-10-CM | POA: Insufficient documentation

## 2024-06-21 DIAGNOSIS — E1010 Type 1 diabetes mellitus with ketoacidosis without coma: Principal | ICD-10-CM | POA: Insufficient documentation

## 2024-06-21 DIAGNOSIS — E1011 Type 1 diabetes mellitus with ketoacidosis with coma: Secondary | ICD-10-CM

## 2024-06-21 DIAGNOSIS — E1110 Type 2 diabetes mellitus with ketoacidosis without coma: Secondary | ICD-10-CM | POA: Diagnosis present

## 2024-06-21 DIAGNOSIS — N179 Acute kidney failure, unspecified: Secondary | ICD-10-CM | POA: Diagnosis not present

## 2024-06-21 DIAGNOSIS — F1919 Other psychoactive substance abuse with unspecified psychoactive substance-induced disorder: Secondary | ICD-10-CM | POA: Insufficient documentation

## 2024-06-21 DIAGNOSIS — R739 Hyperglycemia, unspecified: Secondary | ICD-10-CM | POA: Diagnosis present

## 2024-06-21 LAB — URINALYSIS, ROUTINE W REFLEX MICROSCOPIC
Bilirubin Urine: NEGATIVE
Glucose, UA: >=500 mg/dL — AB
Hgb urine dipstick: NEGATIVE
Ketones, ur: 20 mg/dL — AB
Leukocytes,Ua: NEGATIVE
Nitrite: NEGATIVE
Protein, ur: 30 mg/dL — AB
Specific Gravity, Urine: 1.02 (ref 1.005–1.030)
pH: 6 (ref 5.0–8.0)

## 2024-06-21 LAB — BASIC METABOLIC PANEL WITH GFR
Anion gap: 14 (ref 5–15)
BUN: 22 mg/dL — ABNORMAL HIGH (ref 6–20)
CO2: 23 mmol/L (ref 22–32)
Calcium: 9.7 mg/dL (ref 8.9–10.3)
Chloride: 94 mmol/L — ABNORMAL LOW (ref 98–111)
Creatinine, Ser: 0.8 mg/dL (ref 0.61–1.24)
GFR, Estimated: >60 mL/min (ref >60)
Glucose, Bld: 87 mg/dL (ref 70–99)
Potassium: 3.7 mmol/L (ref 3.5–5.1)
Sodium: 131 mmol/L — ABNORMAL LOW (ref 135–145)

## 2024-06-21 LAB — RAPID URINE DRUG SCREEN, HOSP PERFORMED
Amphetamines: NOT DETECTED
Barbiturates: NOT DETECTED
Benzodiazepines: NOT DETECTED
Cocaine: POSITIVE — AB
Opiates: NOT DETECTED
Tetrahydrocannabinol: POSITIVE — AB

## 2024-06-21 LAB — I-STAT CHEM 8, ED
BUN: 30 mg/dL — ABNORMAL HIGH (ref 6–20)
Calcium, Ion: 1.21 mmol/L (ref 1.15–1.40)
Chloride: 91 mmol/L — ABNORMAL LOW (ref 98–111)
Creatinine, Ser: 1.1 mg/dL (ref 0.61–1.24)
Glucose, Bld: 498 mg/dL — ABNORMAL HIGH (ref 70–99)
HCT: 51 % (ref 39.0–52.0)
Hemoglobin: 17.3 g/dL — ABNORMAL HIGH (ref 13.0–17.0)
Potassium: 4.8 mmol/L (ref 3.5–5.1)
Sodium: 121 mmol/L — ABNORMAL LOW (ref 135–145)
TCO2: 17 mmol/L — ABNORMAL LOW (ref 22–32)

## 2024-06-21 LAB — CBC WITH DIFFERENTIAL/PLATELET
Abs Immature Granulocytes: 0.03 K/uL (ref 0.00–0.07)
Basophils Absolute: 0 K/uL (ref 0.0–0.1)
Basophils Relative: 0 %
Eosinophils Absolute: 0 K/uL (ref 0.0–0.5)
Eosinophils Relative: 0 %
HCT: 45.6 % (ref 39.0–52.0)
Hemoglobin: 15.8 g/dL (ref 13.0–17.0)
Immature Granulocytes: 0 %
Lymphocytes Relative: 15 %
Lymphs Abs: 1.6 K/uL (ref 0.7–4.0)
MCH: 28.7 pg (ref 26.0–34.0)
MCHC: 34.6 g/dL (ref 30.0–36.0)
MCV: 82.9 fL (ref 80.0–100.0)
Monocytes Absolute: 0.5 K/uL (ref 0.1–1.0)
Monocytes Relative: 4 %
Neutro Abs: 8.7 K/uL — ABNORMAL HIGH (ref 1.7–7.7)
Neutrophils Relative %: 81 %
Platelets: 397 K/uL (ref 150–400)
RBC: 5.5 MIL/uL (ref 4.22–5.81)
RDW: 12.7 % (ref 11.5–15.5)
WBC: 10.9 K/uL — ABNORMAL HIGH (ref 4.0–10.5)
nRBC: 0 % (ref 0.0–0.2)

## 2024-06-21 LAB — COMPREHENSIVE METABOLIC PANEL WITH GFR
ALT: 24 U/L (ref 0–44)
AST: 13 U/L — ABNORMAL LOW (ref 15–41)
Albumin: 4.4 g/dL (ref 3.5–5.0)
Alkaline Phosphatase: 104 U/L (ref 38–126)
Anion gap: 19 — ABNORMAL HIGH (ref 5–15)
BUN: 30 mg/dL — ABNORMAL HIGH (ref 6–20)
CO2: 15 mmol/L — ABNORMAL LOW (ref 22–32)
Calcium: 10.1 mg/dL (ref 8.9–10.3)
Chloride: 88 mmol/L — ABNORMAL LOW (ref 98–111)
Creatinine, Ser: 1.31 mg/dL — ABNORMAL HIGH (ref 0.61–1.24)
GFR, Estimated: >60 mL/min (ref >60)
Glucose, Bld: 448 mg/dL — ABNORMAL HIGH (ref 70–99)
Potassium: 4.9 mmol/L (ref 3.5–5.1)
Sodium: 122 mmol/L — ABNORMAL LOW (ref 135–145)
Total Bilirubin: 1.6 mg/dL — ABNORMAL HIGH (ref 0.0–1.2)
Total Protein: 9.3 g/dL — ABNORMAL HIGH (ref 6.5–8.1)

## 2024-06-21 LAB — GLUCOSE, CAPILLARY
Glucose-Capillary: 101 mg/dL — ABNORMAL HIGH (ref 70–99)
Glucose-Capillary: 101 mg/dL — ABNORMAL HIGH (ref 70–99)
Glucose-Capillary: 155 mg/dL — ABNORMAL HIGH (ref 70–99)
Glucose-Capillary: 60 mg/dL — ABNORMAL LOW (ref 70–99)

## 2024-06-21 LAB — CULTURE, BLOOD (ROUTINE X 2)
Culture: NO GROWTH
Culture: NO GROWTH
Special Requests: ADEQUATE
Special Requests: ADEQUATE

## 2024-06-21 LAB — BLOOD GAS, VENOUS
Acid-base deficit: 6.2 mmol/L — ABNORMAL HIGH (ref 0.0–2.0)
Bicarbonate: 16.5 mmol/L — ABNORMAL LOW (ref 20.0–28.0)
O2 Saturation: 98.2 %
Patient temperature: 37
pCO2, Ven: 26 mmHg — ABNORMAL LOW (ref 44–60)
pH, Ven: 7.41 (ref 7.25–7.43)
pO2, Ven: 86 mmHg — ABNORMAL HIGH (ref 32–45)

## 2024-06-21 LAB — CBG MONITORING, ED
Glucose-Capillary: 460 mg/dL — ABNORMAL HIGH (ref 70–99)
Glucose-Capillary: 381 mg/dL — ABNORMAL HIGH (ref 70–99)
Glucose-Capillary: 196 mg/dL — ABNORMAL HIGH (ref 70–99)

## 2024-06-21 LAB — BETA-HYDROXYBUTYRIC ACID: Beta-Hydroxybutyric Acid: 5.1 mmol/L — ABNORMAL HIGH (ref 0.05–0.27)

## 2024-06-21 LAB — MRSA NEXT GEN BY PCR, NASAL: MRSA by PCR Next Gen: DETECTED — AB

## 2024-06-21 LAB — ETHANOL: Alcohol, Ethyl (B): <15 mg/dL (ref <15)

## 2024-06-21 LAB — SODIUM, URINE, RANDOM: Sodium, Ur: <10 mmol/L

## 2024-06-21 MED ORDER — ORAL CARE MOUTH RINSE
15.0000 mL | OROMUCOSAL | Status: DC | PRN
Start: 2024-06-21 — End: 2024-06-22

## 2024-06-21 MED ORDER — ENOXAPARIN SODIUM 40 MG/0.4ML IJ SOSY: 40.0000 mg | PREFILLED_SYRINGE | INTRAMUSCULAR | Status: DC

## 2024-06-21 MED ORDER — POTASSIUM CHLORIDE 10 MEQ/100ML IV SOLN
10.0000 meq | INTRAVENOUS | Status: AC
  Administered 2024-06-21 (×2): 10 meq via INTRAVENOUS
  Filled 2024-06-21 (×2): qty 100

## 2024-06-21 MED ORDER — SENNOSIDES-DOCUSATE SODIUM 8.6-50 MG PO TABS: 1.0000 | ORAL_TABLET | Freq: Every evening | ORAL | Status: DC | PRN

## 2024-06-21 MED ORDER — INSULIN REGULAR(HUMAN) IN NACL 100-0.9 UT/100ML-% IV SOLN
INTRAVENOUS | Status: DC
  Administered 2024-06-21: 8 [IU]/h via INTRAVENOUS
  Filled 2024-06-21: qty 100

## 2024-06-21 MED ORDER — LACTATED RINGERS IV BOLUS
1000.0000 mL | Freq: Once | INTRAVENOUS | Status: AC
  Administered 2024-06-21: 1000 mL via INTRAVENOUS

## 2024-06-21 MED ORDER — CHLORHEXIDINE GLUCONATE CLOTH 2 % EX PADS
6.0000 | MEDICATED_PAD | Freq: Every day | CUTANEOUS | Status: DC
  Administered 2024-06-21: 6 via TOPICAL

## 2024-06-21 MED ORDER — DEXTROSE 50 % IV SOLN
0.0000 mL | INTRAVENOUS | Status: DC | PRN
  Administered 2024-06-21 – 2024-06-22 (×2): 15 mL via INTRAVENOUS
  Filled 2024-06-21: qty 50

## 2024-06-21 MED ORDER — LACTATED RINGERS IV SOLN: INTRAVENOUS | Status: DC

## 2024-06-21 MED ORDER — ACETAMINOPHEN 325 MG PO TABS: 650.0000 mg | ORAL_TABLET | Freq: Four times a day (QID) | ORAL | Status: DC | PRN

## 2024-06-21 MED ORDER — ONDANSETRON HCL 4 MG/2ML IJ SOLN: 4.0000 mg | Freq: Four times a day (QID) | INTRAMUSCULAR | Status: DC | PRN

## 2024-06-21 MED ORDER — BISACODYL 5 MG PO TBEC: 5.0000 mg | DELAYED_RELEASE_TABLET | Freq: Every day | ORAL | Status: DC | PRN

## 2024-06-21 MED ORDER — DEXTROSE IN LACTATED RINGERS 5 % IV SOLN: INTRAVENOUS | Status: DC

## 2024-06-21 MED ORDER — ONDANSETRON HCL 4 MG PO TABS: 4.0000 mg | ORAL_TABLET | Freq: Four times a day (QID) | ORAL | Status: DC | PRN

## 2024-06-21 MED ORDER — ACETAMINOPHEN 650 MG RE SUPP: 650.0000 mg | Freq: Four times a day (QID) | RECTAL | Status: DC | PRN

## 2024-06-21 NOTE — ED Triage Notes (Signed)
 Patient has had a sugar in the 500s for 2 days. Stated he has been vomiting and taking his insulin  shots. Has type 1 diabetes.

## 2024-06-21 NOTE — ED Provider Triage Note (Signed)
 Emergency Medicine Provider Triage Evaluation Note  Jacob [name] , a 33 y.o. male  was evaluated in triage.  Pt complains of high blood sugar. Family reports that patient has been vomiting, sugar reported of over 500 at home.   Review of Systems  Positive: Vomiting, not feeling well Negative: Fever, chills,   Physical Exam  There were no vitals taken for this visit. Gen:   Awake, no distress   Resp:  Normal effort  MSK:   Moves extremities without difficulty  Other:  Patient ill-appearing, neurologically at baseline per family at bedside, not in acute distress, patient responsive and conversational  Medical Decision Making  Medically screening exam initiated at 4:34 PM.  Appropriate orders placed.  Jacob [name] was informed that the remainder of the evaluation will be completed by another provider, this initial triage assessment does not replace that evaluation, and the importance of remaining in the ED until their evaluation is complete.  Orders: CBC, CMP, I stat chem 8, POC CBG, VBG, urinalysis, beta hydroxybutyric acid, EKG  Suspect DKA   Janetta Terrall [place], [place] 06/21/24 1645

## 2024-06-21 NOTE — ED Provider Notes (Signed)
 [place] [facility] AT [facility] Provider Note  CSN: [id number] Arrival date & time: 06/21/24 1630  Chief Complaint(s) Hyperglycemia  HPI Jacob [name] is a 33 y.o. male history of bipolar disorder, schizoaffective disorder, type 1 diabetes presented to the emergency department with vomiting.  Patient reports also having high blood sugar.  He reports that he has been compliant with his insulin  and has been keeping it refrigerated.  His mom reports he has been living somewhere called [facility] and concerned that he might not be taking care of himself, she does see him use insulin  sometimes.  Patient denies pain anywhere.  Denies any fevers or chills.  Denies any chest pain.  Denies any diarrhea or abdominal pain.  Reports feeling very tired.   Past Medical History Past Medical History:  Diagnosis Date   Bipolar 1 disorder (HCC)    History of attempted suicide 2019   Unsuccessful suicide attempt in 2019 with rat poison   Schizoaffective disorder (HCC)    Type 1 diabetes mellitus on insulin  therapy (HCC) 2019   Vitamin D  deficiency 01/03/2024   Patient Active Problem List   Diagnosis Date Noted   Injury of face 05/28/2024   Non compliance w medication regimen 05/05/2024   Diabetic ketoacidosis (HCC) 05/03/2024   Hyperkalemia 05/03/2024   Nonadherence to medication 05/03/2024   Leukocytosis 05/03/2024   Hyperglycemia 03/02/2024   Dehydration 03/02/2024   Type 1 diabetes mellitus with ketoacidotic coma (HCC) 02/20/2024   Tobacco abuse 02/18/2024   Diabetic polyneuropathy associated with type 2 diabetes mellitus (HCC) 02/18/2024   DKA (diabetic ketoacidosis) (HCC) 02/18/2024   Vitamin D  deficiency 01/10/2024   Malingering 12/27/2023   Psychosis (HCC) 12/26/2023   Homeless 12/17/2023   Acute kidney injury (HCC) 12/17/2023   Homelessness 12/17/2023   Influenza A 11/30/2023   Uncontrolled type 1 diabetes mellitus with hyperglycemia, with long-term current  use of insulin  (HCC) 10/10/2023   Blister of right foot 08/01/2023   Generalized anxiety disorder 08/31/2022   Tobacco dependence 08/31/2022   Schizoaffective disorder, bipolar type (HCC) 12/03/2021   Uncontrolled type 1 diabetes mellitus with hypoglycemia without coma (HCC) 06/06/2020   Polycythemia 03/04/2020   AKI (acute kidney injury) (HCC) 03/04/2020   MDD (major depressive disorder), recurrent, severe, with psychosis (HCC)    Home Medication(s) Prior to Admission medications   Medication Sig Start Date End Date Taking? Authorizing Provider  Accu-Chek Softclix Lancets lancets 1 each in the morning, at noon, and at bedtime. May substitute to any manufacturer covered by patient's insurance. 05/28/24 06/27/24  [name], [name]  benztropine  (COGENTIN ) 0.5 MG tablet Take 1 tablet (0.5 mg total) by mouth 2 (two) times daily. For prevention of EPS Patient not taking: Reported on 05/04/2024 02/09/24   Collene Gouge I, NP  Blood Glucose Monitoring Suppl (BLOOD GLUCOSE MONITOR SYSTEM) w/Device KIT Use in the morning, at noon, and at bedtime. 05/28/24   [name], [name]  Glucose Blood (BLOOD GLUCOSE TEST STRIPS) STRP Use in the morning, at noon, and at bedtime. 05/28/24 06/27/24  [name], [name]  haloperidol  decanoate (HALDOL  DECANOATE) 100 MG/ML injection Inject 1 mL (100 mg total) into the muscle every 30 (thirty) days. (Due on 03-09-24): For mood control Patient not taking: Reported on 02/19/2024 03/09/24   Collene Gouge I, NP  hydrOXYzine  (ATARAX ) 25 MG tablet Take 25 mg by mouth 3 (three) times daily as needed for anxiety. Patient not taking: Reported on 05/04/2024    [provider]  ibuprofen  (ADVIL ) 200 MG tablet Take 400 mg by mouth 2 (two) times daily as needed for headache or moderate pain (pain score 4-6).    [provider]  insulin  aspart (NOVOLOG  FLEXPEN) 100 UNIT/ML FlexPen Inject 7 Units into the skin 3 (three) times daily before meals. For diabetes control 03/20/24    Stephanie Freund, MD  insulin  glargine (LANTUS  SOLOSTAR) 100 UNIT/ML Solostar Pen Inject 30 Units into the skin daily. For diabetes control. Patient taking differently: Inject 24 Units into the skin daily. For diabetes control. 03/20/24   Stephanie Freund, MD  Lancet Device MISC 1 each by Does not apply route in the morning, at noon, and at bedtime. May substitute to any manufacturer covered by patient's insurance. 05/28/24 06/27/24  [name], [name]  traZODone  (DESYREL ) 50 MG tablet Take 50 mg by mouth at bedtime. Patient not taking: Reported on 05/04/2024    [provider]                                                                                                                                    Past Surgical History History reviewed. No pertinent surgical history. Family History Family History  Problem Relation Age of Onset   Healthy Mother    Healthy Father     Social History Social History   Tobacco Use   Smoking status: Every Day    Current packs/day: 0.15    Types: Cigars, Cigarettes   Smokeless tobacco: Never   Tobacco comments:    2 BLACK AND MILD A DAY  Vaping Use   Vaping status: Every Day  Substance Use Topics   Alcohol use: Yes    Comment: social/occassional   Drug use: Not Currently    Types: Marijuana   Allergies Patient has no known allergies.  Review of Systems Review of Systems  All other systems reviewed and are negative.   Physical Exam Vital Signs  I have reviewed the triage vital signs BP (!) 143/104 (BP Location: Left Arm)   Pulse 99   Temp 98.8 F (37.1 C) (Oral)   Resp 10   Ht 5' 9 (1.753 m)   Wt 58 kg   SpO2 100%   BMI 18.88 kg/m  Physical Exam Vitals and nursing note reviewed.  Constitutional:      General: He is not in acute distress.    Appearance: Normal appearance.  HENT:     Mouth/Throat:     Mouth: Mucous membranes are dry.  Eyes:     Conjunctiva/sclera: Conjunctivae normal.  Cardiovascular:     Rate and  Rhythm: Regular rhythm. Tachycardia present.  Pulmonary:     Effort: Pulmonary effort is normal.     Breath sounds: Normal breath sounds.     Comments: Mild tachypnea Abdominal:     General: Abdomen is flat.     Palpations: Abdomen is soft.     Tenderness: There is no abdominal  tenderness.  Musculoskeletal:     Right lower leg: No edema.     Left lower leg: No edema.  Skin:    General: Skin is warm and dry.     Capillary Refill: Capillary refill takes less than 2 seconds.  Neurological:     Mental Status: He is alert and oriented to person, place, and time. Mental status is at baseline.  Psychiatric:        Mood and Affect: Mood normal.        Behavior: Behavior normal.     ED Results and Treatments Labs (all labs ordered are listed, but only abnormal results are displayed) Labs Reviewed  CBC WITH DIFFERENTIAL/PLATELET - Abnormal; Notable for the following components:      Result Value   WBC 10.9 (*)    Neutro Abs 8.7 (*)    All other components within normal limits  COMPREHENSIVE METABOLIC PANEL WITH GFR - Abnormal; Notable for the following components:   Sodium 122 (*)    Chloride 88 (*)    CO2 15 (*)    Glucose, Bld 448 (*)    BUN 30 (*)    Creatinine, Ser 1.31 (*)    Total Protein 9.3 (*)    AST 13 (*)    Total Bilirubin 1.6 (*)    Anion gap 19 (*)    All other components within normal limits  BETA-HYDROXYBUTYRIC ACID - Abnormal; Notable for the following components:   Beta-Hydroxybutyric Acid 5.10 (*)    All other components within normal limits  BLOOD GAS, VENOUS - Abnormal; Notable for the following components:   pCO2, Ven 26 (*)    pO2, Ven 86 (*)    Bicarbonate 16.5 (*)    Acid-base deficit 6.2 (*)    All other components within normal limits  CBG MONITORING, ED - Abnormal; Notable for the following components:   Glucose-Capillary 460 (*)    All other components within normal limits  CBG MONITORING, ED - Abnormal; Notable for the following components:    Glucose-Capillary 381 (*)    All other components within normal limits  I-STAT CHEM 8, ED - Abnormal; Notable for the following components:   Sodium 121 (*)    Chloride 91 (*)    BUN 30 (*)    Glucose, Bld 498 (*)    TCO2 17 (*)    Hemoglobin 17.3 (*)    All other components within normal limits  URINALYSIS, ROUTINE W REFLEX MICROSCOPIC                                                                                                                          Radiology DG Chest Portable 1 View Result Date: 06/21/2024 CLINICAL DATA:  Shortness of breath. EXAM: PORTABLE CHEST 1 VIEW COMPARISON:  Five days ago 06/16/2024 FINDINGS: The cardiomediastinal contours are normal. The lungs are clear. Pulmonary vasculature is normal. No consolidation, pleural effusion, or pneumothorax. No acute osseous abnormalities are seen. IMPRESSION: No active  disease. Electronically Signed   By: Andrea Gasman M.D.   On: 06/21/2024 18:01    Pertinent labs & imaging results that were available during my care of the patient were reviewed by me and considered in my medical decision making (see MDM for details).  Medications Ordered in ED Medications  insulin  regular, human (MYXREDLIN ) 100 units/ 100 mL infusion (8 Units/hr Intravenous New Bag/Given 06/21/24 1821)  lactated ringers  infusion (has no administration in time range)  dextrose  5 % in lactated ringers  infusion (has no administration in time range)  dextrose  50 % solution 0-50 mL (has no administration in time range)  potassium chloride  10 mEq in 100 mL IVPB (10 mEq Intravenous New Bag/Given 06/21/24 1809)  lactated ringers  bolus 1,000 mL (1,000 mLs Intravenous New Bag/Given 06/21/24 1644)  lactated ringers  bolus 1,000 mL (1,000 mLs Intravenous New Bag/Given 06/21/24 1803)                                                                                                                                     Procedures .Critical Care  Performed by: Francesca Elsie CROME, MD Authorized by: Francesca Elsie CROME, MD   Critical care provider statement:    Critical care time (minutes):  35   Critical care was necessary to treat or prevent imminent or life-threatening deterioration of the following conditions:  Endocrine crisis   Critical care was time spent personally by me on the following activities:  Development of treatment plan with patient or surrogate, discussions with consultants, evaluation of patient's response to treatment, examination of patient, ordering and review of laboratory studies, ordering and review of radiographic studies, ordering and performing treatments and interventions, pulse oximetry, re-evaluation of patient's condition and review of old charts   Care discussed with: admitting provider     (including critical care time)  Medical Decision Making / ED Course   MDM:  33 year old presenting to the emergency department with vomiting, high blood sugar.  Patient exam notable for tachycardia, mild tachypnea.  Otherwise nonfocal.  Does appear dehydrated.  Suspect symptoms likely due to DKA.  Patient has multiple prior admissions for DKA.  He reports he is using his insulin  however his blood sugar is in the 500s.  Suspect most likely cause is noncompliance but will check other laboratory testing including chest x-ray, urinalysis to evaluate for infection.  Denies chest pain to suggest ACS, EKG seems similar to prior although faster rate.  Will reassess.  Will give additional fluids.  Will need admission.  Potassium is reassuring on point-of-care testing so we will start insulin  drip. Clinical Course as of 06/21/24 1851  Thu Jun 21, 2024  1850 Discussed with Dr. Lou with medicine who will admit for DKA [WS]    Clinical Course User Index [WS] Francesca Elsie CROME, MD     Additional history obtained: -Additional history obtained from family -External records from outside source obtained and reviewed including:  Chart review  including previous notes, labs, imaging, consultation notes including prior admission   Lab Tests: -I ordered, reviewed, and interpreted labs.   The pertinent results include:   Labs Reviewed  CBC WITH DIFFERENTIAL/PLATELET - Abnormal; Notable for the following components:      Result Value   WBC 10.9 (*)    Neutro Abs 8.7 (*)    All other components within normal limits  COMPREHENSIVE METABOLIC PANEL WITH GFR - Abnormal; Notable for the following components:   Sodium 122 (*)    Chloride 88 (*)    CO2 15 (*)    Glucose, Bld 448 (*)    BUN 30 (*)    Creatinine, Ser 1.31 (*)    Total Protein 9.3 (*)    AST 13 (*)    Total Bilirubin 1.6 (*)    Anion gap 19 (*)    All other components within normal limits  BETA-HYDROXYBUTYRIC ACID - Abnormal; Notable for the following components:   Beta-Hydroxybutyric Acid 5.10 (*)    All other components within normal limits  BLOOD GAS, VENOUS - Abnormal; Notable for the following components:   pCO2, Ven 26 (*)    pO2, Ven 86 (*)    Bicarbonate 16.5 (*)    Acid-base deficit 6.2 (*)    All other components within normal limits  CBG MONITORING, ED - Abnormal; Notable for the following components:   Glucose-Capillary 460 (*)    All other components within normal limits  CBG MONITORING, ED - Abnormal; Notable for the following components:   Glucose-Capillary 381 (*)    All other components within normal limits  I-STAT CHEM 8, ED - Abnormal; Notable for the following components:   Sodium 121 (*)    Chloride 91 (*)    BUN 30 (*)    Glucose, Bld 498 (*)    TCO2 17 (*)    Hemoglobin 17.3 (*)    All other components within normal limits  URINALYSIS, ROUTINE W REFLEX MICROSCOPIC    Notable for signs of DKA   EKG   EKG Interpretation Date/Time:  Thursday June 21 2024 16:38:15 EDT Ventricular Rate:  125 PR Interval:  122 QRS Duration:  74 QT Interval:  348 QTC Calculation: 502 R Axis:   115  Text Interpretation: Sinus tachycardia  Ventricular premature complex Aberrant complex LAE, consider biatrial enlargement Minimal ST depression, inferior leads ST elevation, consider anterior injury Compared to previous tracing SINCE LAST TRACING HEART RATE HAS INCREASED Confirmed by Francesca Fallow ([id number]) on 06/21/2024 5:12:43 PM         Imaging Studies ordered: I ordered imaging studies including CXR On my interpretation imaging demonstrates no acute process I independently visualized and interpreted imaging. I agree with the radiologist interpretation   Medicines ordered and prescription drug management: Meds ordered this encounter  Medications   lactated ringers  bolus 1,000 mL   lactated ringers  bolus 1,000 mL   insulin  regular, human (MYXREDLIN ) 100 units/ 100 mL infusion    EndoTool Goal Range::   140-180    Type of Diabetes:   Type 1    Mode of Therapy:   ENDOX1 for DKA    Start Method:   EndoTool to calculate   lactated ringers  infusion   dextrose  5 % in lactated ringers  infusion   dextrose  50 % solution 0-50 mL   potassium chloride  10 mEq in 100 mL IVPB    -I have reviewed the patients home medicines and have made adjustments as needed  Consultations Obtained: I requested consultation with the hospitalist,  and discussed lab and imaging findings as well as pertinent plan - they recommend: admission   Cardiac Monitoring: The patient was maintained on a cardiac monitor.  I personally viewed and interpreted the cardiac monitored which showed an underlying rhythm of: NSR   Reevaluation: After the interventions noted above, I reevaluated the patient and found that their symptoms have improved  Co morbidities that complicate the patient evaluation  Past Medical History:  Diagnosis Date   Bipolar 1 disorder (HCC)    History of attempted suicide 2019   Unsuccessful suicide attempt in 2019 with rat poison   Schizoaffective disorder (HCC)    Type 1 diabetes mellitus on insulin  therapy (HCC) 2019    Vitamin D  deficiency 01/03/2024      Dispostion: Disposition decision including need for hospitalization was considered, and patient admitted to the hospital.    Final Clinical Impression(s) / ED Diagnoses Final diagnoses:  Diabetic ketoacidosis without coma associated with type 1 diabetes mellitus (HCC)     This chart was dictated using voice recognition software.  Despite best efforts to proofread,  errors can occur which can change the documentation meaning.    Francesca Elsie CROME, MD 06/21/24 [phone]

## 2024-06-21 NOTE — H&P (Addendum)
 History and Physical  Jacob [name] FMW:[id number] DOB: [birth year] DOA: 06/21/2024  PCP: Napoleon Limes, MD   Chief Complaint: Elevated blood sugar  HPI: Jacob [name] is a 33 y.o. male with medical history significant for uncontrolled type 1 diabetes, MDD with psychosis, generalized anxiety disorder, schizoaffective disorder-bipolar type and marijuana use disorder who presented to the ED for evaluation of elevated blood sugar. On evaluation, patient somnolent and lethargic appearing with flat affect. States he feels fine and denies any nausea, vomiting, abdominal pain, polyuria polydipsia. Patient is disoriented to time and situation. Spoke with his mother over the phone who informs me that patient currently lives in a housing facility on [address] in a program that is working on helping him with permanent housing. She visited patient today and noticed  he did not look right so she brought him to the ED.  States she thinks patient has been taking his psych medications but unsure if he is compliant with his insulin  regimen. Patient denies any drug or alcohol use and states he has been taking all his medications including his insulin . Repetitively stares off during conversation.   ED Course: Initial vitals show temp 98.6, RR 24, HR 125, BP 129/117, SpO2 99% on room air. Initial labs significant for sodium 122, K+ 4.9, bicarb 16, glucose 448, BUN/creatinine 30/1.31, anion gap 19, WBC 10.9, VBG with pH of 7.41, BHB 5.10. EKG shows sinus tach with PVCs and nonspecific ST changes. CXR shows no active disease. Pt received IV LR 1 L bolus x 2, IV KCl 10 mEq x 2 doses and started on insulin  drip. TRH was consulted for admission.   Review of Systems: Please see HPI for pertinent positives and negatives. A complete 10 system review of systems are otherwise negative.  Past Medical History:  Diagnosis Date   Bipolar 1 disorder (HCC)    History of attempted suicide 2019   Unsuccessful suicide attempt in  2019 with rat poison   Schizoaffective disorder (HCC)    Type 1 diabetes mellitus on insulin  therapy (HCC) 2019   Vitamin D  deficiency 01/03/2024   History reviewed. No pertinent surgical history. Social History:  reports that he has been smoking cigars and cigarettes. He has never used smokeless tobacco. He reports current alcohol use. He reports that he does not currently use drugs after having used the following drugs: Marijuana.  No Known Allergies  Family History  Problem Relation Age of Onset   Healthy Mother    Healthy Father      Prior to Admission medications   Medication Sig Start Date End Date Taking? Authorizing Provider  Accu-Chek Softclix Lancets lancets 1 each in the morning, at noon, and at bedtime. May substitute to any manufacturer covered by patient's insurance. 05/28/24 06/27/24  [name], [name]  benztropine  (COGENTIN ) 0.5 MG tablet Take 1 tablet (0.5 mg total) by mouth 2 (two) times daily. For prevention of EPS Patient not taking: Reported on 05/04/2024 02/09/24   Collene Gouge I, NP  Blood Glucose Monitoring Suppl (BLOOD GLUCOSE MONITOR SYSTEM) w/Device KIT Use in the morning, at noon, and at bedtime. 05/28/24   [name], [name]  Glucose Blood (BLOOD GLUCOSE TEST STRIPS) STRP Use in the morning, at noon, and at bedtime. 05/28/24 06/27/24  [name], [name]  haloperidol  decanoate (HALDOL  DECANOATE) 100 MG/ML injection Inject 1 mL (100 mg total) into the muscle every 30 (thirty) days. (Due on 03-09-24): For mood control Patient not taking: Reported on 02/19/2024 03/09/24   Nwoko,  Mac FERNS, NP  hydrOXYzine  (ATARAX ) 25 MG tablet Take 25 mg by mouth 3 (three) times daily as needed for anxiety. Patient not taking: Reported on 05/04/2024    [provider]  ibuprofen  (ADVIL ) 200 MG tablet Take 400 mg by mouth 2 (two) times daily as needed for headache or moderate pain (pain score 4-6).    [provider]  insulin  aspart (NOVOLOG  FLEXPEN) 100 UNIT/ML FlexPen  Inject 7 Units into the skin 3 (three) times daily before meals. For diabetes control 03/20/24   Stephanie Freund, MD  insulin  glargine (LANTUS  SOLOSTAR) 100 UNIT/ML Solostar Pen Inject 30 Units into the skin daily. For diabetes control. Patient taking differently: Inject 24 Units into the skin daily. For diabetes control. 03/20/24   Stephanie Freund, MD  Lancet Device MISC 1 each by Does not apply route in the morning, at noon, and at bedtime. May substitute to any manufacturer covered by patient's insurance. 05/28/24 06/27/24  [name], [name]  traZODone  (DESYREL ) 50 MG tablet Take 50 mg by mouth at bedtime. Patient not taking: Reported on 05/04/2024    [provider]    Physical Exam: BP (!) 143/104 (BP Location: Left Arm)   Pulse 99   Temp 98.8 F (37.1 C) (Oral)   Resp 10   Ht 5' 9 (1.753 m)   Wt 58 kg   SpO2 100%   BMI 18.88 kg/m  General: Somnolent and weak-appearing young man laying in bed. No acute distress. HEENT: [place]/AT. Anicteric sclera. CV: Tachycardic. Regular rhythm. No murmurs, rubs, or gallops. No LE edema Pulmonary: Lungs CTAB. Normal effort. No wheezing or rales. Abdominal: Soft, nontender, nondistended. Normal bowel sounds. Extremities: Palpable radial and DP pulses. Normal ROM. Skin: Warm and dry. No obvious rash or lesions. Neuro: Somnolent. Disoriented to time and situation. Moves all extremities. Normal sensation to light touch.  Psych: Flat affect          Labs on Admission:  Basic Metabolic Panel: Recent Labs  Lab 06/17/24 0030 06/17/24 0439 06/17/24 1000 06/17/24 1210 06/21/24 1641 06/21/24 1653  NA 138 141 134* 130* 122* 121*  K 5.0 4.0 3.7 3.5 4.9 4.8  CL 104 106 101 98 88* 91*  CO2 18* 24 23 25  15*  --   GLUCOSE 205* 103* 125* 233* 448* 498*  BUN 19 17 12 11  30* 30*  CREATININE 1.55* 1.25* 0.93 1.06 1.31* 1.10  CALCIUM  9.1 9.1 9.0 8.7* 10.1  --    Liver Function Tests: Recent Labs  Lab 06/16/24 1742 06/21/24 1641  AST 35 13*   ALT 39 24  ALKPHOS 111 104  BILITOT 2.2* 1.6*  PROT 8.7* 9.3*  ALBUMIN 4.6 4.4   No results for input(s): LIPASE, AMYLASE in the last 168 hours. No results for input(s): AMMONIA in the last 168 hours. CBC: Recent Labs  Lab 06/16/24 1742 06/16/24 1749 06/16/24 1836 06/16/24 2108 06/17/24 0439 06/21/24 1641 06/21/24 1653  WBC 33.0*  --   --   --  18.8* 10.9*  --   NEUTROABS 29.0*  --   --   --   --  8.7*  --   HGB 16.1   < > 16.3 13.6 12.8* 15.8 17.3*  HCT 51.7   < > 48.0 40.0 37.0* 45.6 51.0  MCV 93.5  --   --   --  84.1 82.9  --   PLT 575*  --   --   --  435* 397  --    < > =  values in this interval not displayed.   Cardiac Enzymes: No results for input(s): CKTOTAL, CKMB, CKMBINDEX, TROPONINI in the last 168 hours. BNP (last 3 results) No results for input(s): BNP in the last 8760 hours.  ProBNP (last 3 results) No results for input(s): PROBNP in the last 8760 hours.  CBG: Recent Labs  Lab 06/17/24 1138 06/17/24 1253 06/21/24 1635 06/21/24 1757 06/21/24 1923  GLUCAP 237* 242* 460* 381* 196*    Radiological Exams on Admission: DG Chest Portable 1 View Result Date: 06/21/2024 CLINICAL DATA:  Shortness of breath. EXAM: PORTABLE CHEST 1 VIEW COMPARISON:  Five days ago 06/16/2024 FINDINGS: The cardiomediastinal contours are normal. The lungs are clear. Pulmonary vasculature is normal. No consolidation, pleural effusion, or pneumothorax. No acute osseous abnormalities are seen. IMPRESSION: No active disease. Electronically Signed   By: Andrea Gasman M.D.   On: 06/21/2024 18:01   Assessment/Plan Jacob [name] is a 33 y.o. male with medical history significant for uncontrolled type 1 diabetes, MDD with psychosis, generalized anxiety disorder, schizoaffective disorder-bipolar type and marijuana use disorder who presented to the ED for evaluation of elevated blood sugar and admitted for DKA.   # DKA # Uncontrolled type 1 diabetes - Presented with  elevated blood sugar, lethargy and weakness - Found to have severe hyperglycemia, AGMA and ketonemia - Hx of uncontrolled diabetes with last A1c 14.9% 7 weeks ago - S/p LR bolus and initiation of insulin  drip in ED - Pt tachycardic and slightly altered on exam - Continue insulin  drip - LR @ 125 mL/hr until CBG less than 250 - Switch to D5-LR when 1 CBG less than 250 - Keep NPO  - BMP Q4H, CBG Q1H, BHB Q8H - Once anion gap closed 2, start CM diet and if able to eat, administer Semglee  24 units - Continue insulin  drip for 1-2 more hours, then discontinue and start SSI-S  - DC fluids if eating, drinking, and off insulin  drip  # Acute metabolic encephalopathy - Patient found to be lethargic and somnolent on exam - He is disoriented to time and situation - This is likely multifactorial in the setting of DKA, mild hyponatremia and possible substance use - Follow-up UDS, urinalysis and ethanol levels  # AKI - Slight bump in creatinine to 1.31 in the setting of dehydration and DKA - Continue IV LR infusion - Trend renal function, avoid nephrotoxic agents  # Hyponatremia - Sodium of 122 on admission, improved to 128 when corrected for hyperglycemia - Pt lethargic and disoriented on exam - Likely secondary to hypovolemic hyponatremia in the setting of DKA and dehydration - Continue IVF hydration as above - Check urine sodium and urine osm - F/u repeat sodium  # MDD with psychosis  # Schizoaffective disorder, bipolar type  # Generalized Anxiety Disorder - Patient with long history of untreated mood disorder due to medication noncompliance and polysubstance abuse.  - Per recent hospitalization for DKA, patient noncompliant with his psych meds including benztropine  0.5 mg twice daily, Haldol  100 mg injection every 30 days, trazodone  50 mg at bedtime and hydroxyzine  25 mg 3 times daily as needed for anxiety. - Patient's untreated mood disorder has been a significant barrier to management of  his diabetes  # Substance use disorder - Follow-up UDS and ethanol levels   DVT prophylaxis: Lovenox      Code Status: Full Code  Consults called: None  Family Communication: Discussed admission with mother over the phone  Severity of Illness: The appropriate patient status for this  patient is OBSERVATION. Observation status is judged to be reasonable and necessary in order to provide the required intensity of service to ensure the patient's safety. The patient's presenting symptoms, physical exam findings, and initial radiographic and laboratory data in the context of their medical condition is felt to place them at decreased risk for further clinical deterioration. Furthermore, it is anticipated that the patient will be medically stable for discharge from the hospital within 2 midnights of admission.   Level of care: Stepdown   This record has been created using Conservation officer, historic buildings. Errors have been sought and corrected, but may not always be located. Such creation errors do not reflect on the standard of care.   Jacob Claretta HERO, MD 06/21/2024, 8:19 PM [facility] Pager: [phone] Isaiah 41:10   If 7PM-7AM, please contact night-coverage [email or web address] Password [email or web address]

## 2024-06-22 DIAGNOSIS — E1010 Type 1 diabetes mellitus with ketoacidosis without coma: Secondary | ICD-10-CM | POA: Diagnosis not present

## 2024-06-22 DIAGNOSIS — G9341 Metabolic encephalopathy: Secondary | ICD-10-CM | POA: Diagnosis not present

## 2024-06-22 LAB — BASIC METABOLIC PANEL WITH GFR
Anion gap: 9 (ref 5–15)
Anion gap: 10 (ref 5–15)
BUN: 20 mg/dL (ref 6–20)
BUN: 19 mg/dL (ref 6–20)
CO2: 25 mmol/L (ref 22–32)
CO2: 24 mmol/L (ref 22–32)
Calcium: 9.4 mg/dL (ref 8.9–10.3)
Calcium: 9.3 mg/dL (ref 8.9–10.3)
Chloride: 95 mmol/L — ABNORMAL LOW (ref 98–111)
Chloride: 93 mmol/L — ABNORMAL LOW (ref 98–111)
Creatinine, Ser: 0.82 mg/dL (ref 0.61–1.24)
Creatinine, Ser: 0.78 mg/dL (ref 0.61–1.24)
GFR, Estimated: >60 mL/min (ref >60)
GFR, Estimated: >60 mL/min (ref >60)
Glucose, Bld: 75 mg/dL (ref 70–99)
Glucose, Bld: 71 mg/dL (ref 70–99)
Potassium: 2.9 mmol/L — ABNORMAL LOW (ref 3.5–5.1)
Potassium: 3.1 mmol/L — ABNORMAL LOW (ref 3.5–5.1)
Sodium: 129 mmol/L — ABNORMAL LOW (ref 135–145)
Sodium: 127 mmol/L — ABNORMAL LOW (ref 135–145)

## 2024-06-22 LAB — CBC
HCT: 39.1 % (ref 39.0–52.0)
Hemoglobin: 13.1 g/dL (ref 13.0–17.0)
MCH: 27.9 pg (ref 26.0–34.0)
MCHC: 33.5 g/dL (ref 30.0–36.0)
MCV: 83.2 fL (ref 80.0–100.0)
Platelets: 316 K/uL (ref 150–400)
RBC: 4.7 MIL/uL (ref 4.22–5.81)
RDW: 12.4 % (ref 11.5–15.5)
WBC: 9.1 K/uL (ref 4.0–10.5)
nRBC: 0 % (ref 0.0–0.2)

## 2024-06-22 LAB — GLUCOSE, CAPILLARY
Glucose-Capillary: 127 mg/dL — ABNORMAL HIGH (ref 70–99)
Glucose-Capillary: 153 mg/dL — ABNORMAL HIGH (ref 70–99)
Glucose-Capillary: 56 mg/dL — ABNORMAL LOW (ref 70–99)
Glucose-Capillary: 58 mg/dL — ABNORMAL LOW (ref 70–99)
Glucose-Capillary: 71 mg/dL (ref 70–99)
Glucose-Capillary: 72 mg/dL (ref 70–99)
Glucose-Capillary: 77 mg/dL (ref 70–99)
Glucose-Capillary: 95 mg/dL (ref 70–99)

## 2024-06-22 LAB — BETA-HYDROXYBUTYRIC ACID: Beta-Hydroxybutyric Acid: 0.48 mmol/L — ABNORMAL HIGH (ref 0.05–0.27)

## 2024-06-22 LAB — OSMOLALITY, URINE: Osmolality, Ur: 668 mosm/kg (ref 300–900)

## 2024-06-22 LAB — MAGNESIUM: Magnesium: 2.1 mg/dL (ref 1.7–2.4)

## 2024-06-22 MED ORDER — INSULIN GLARGINE 100 UNIT/ML ~~LOC~~ SOLN: 25.0000 [IU] | Freq: Every day | SUBCUTANEOUS | Status: DC

## 2024-06-22 MED ORDER — INSULIN ASPART 100 UNIT/ML IJ SOLN: 0.0000 [IU] | Freq: Three times a day (TID) | INTRAMUSCULAR | Status: DC

## 2024-06-22 MED ORDER — INSULIN ASPART 100 UNIT/ML IJ SOLN: 0.0000 [IU] | Freq: Every day | INTRAMUSCULAR | Status: DC

## 2024-06-22 MED ORDER — INSULIN GLARGINE 100 UNIT/ML ~~LOC~~ SOLN
16.0000 [IU] | Freq: Once | SUBCUTANEOUS | Status: AC
  Administered 2024-06-22: 16 [IU] via SUBCUTANEOUS
  Filled 2024-06-22: qty 0.16

## 2024-06-22 MED ORDER — POTASSIUM CHLORIDE 10 MEQ/100ML IV SOLN
10.0000 meq | INTRAVENOUS | Status: AC
  Administered 2024-06-22 (×6): 10 meq via INTRAVENOUS
  Filled 2024-06-22 (×6): qty 100

## 2024-06-22 MED ORDER — SODIUM CHLORIDE 0.9 % IV SOLN: INTRAVENOUS | Status: DC

## 2024-06-22 MED ORDER — DEXTROSE 50 % IV SOLN: 25.0000 mL | Freq: Once | INTRAVENOUS | Status: DC

## 2024-06-22 NOTE — Discharge Summary (Signed)
 Physician Discharge Summary  Jacob [name]:[id number] DOB: [birth year] DOA: 06/21/2024  PCP: Napoleon Limes, MD  Admit date: 06/21/2024 Discharge date: 06/22/2024  Admitted From: Home  Discharge disposition: Home   Recommendations for Outpatient Follow-Up:   Follow up with your primary care provider in one week.  Check CBC, BMP, magnesium  in the next visit Patient will need compliance with medications/insulin .   Discharge Diagnosis:   Active Problems:   DKA (diabetic ketoacidosis) (HCC)   Acute metabolic encephalopathy   Hyponatremia    Discharge Condition: Improved.  Diet recommendation:   Carbohydrate-modified.    Wound care: None.  Code status: Full.   History of Present Illness:    Jacob [name] is a 33 y.o. male with medical history significant for uncontrolled type 1 diabetes, MDD with psychosis, generalized anxiety disorder, schizoaffective disorder-bipolar type and marijuana use disorder presented to hospital with elevated blood glucose level without any nausea vomiting but was slightly disoriented.  Patient's mother informed that she he lives in a housing facility.  Patient's mom visited him today and he did not look right so he was brought into the hospital.  In the ED patient was noted to be tachypneic and hypertensive.  Initial labs were notable for sodium of 122 with creatinine of 1.3.   VBG with pH of 7.41, BHB 5.10. EKG showed sinus tachycardia with PVCs and nonspecific ST changes.  X-ray did not show any infiltrate.  Patient was given potassium and IV fluids and was admitted to hospital for further evaluation and treatment.   Hospital Course:   Following conditions were addressed during hospitalization as listed below,  Diabetic ketoacidosis/ Uncontrolled type 1 diabetes Patient presented with elevated blood glucose levels lethargic and weakness.  Had severe hyperglycemia.  Last known hemoglobin A1c of 14.9%, 7 weeks ago.  Was started on IV fluids  insulin  drip.  DKA has resolved at this time.  Patient currently on sliding scale insulin , basal insulin .  Patient is on 30 units of Lantus  insulin  once daily and NovoLog  7 unit 3 times at has been restarted on oral diet.  Latest POC glucose of 153.  Patient was emphasized the need for being on insulin  at home.  Unfortunately has had noncompliance issues with medication.   Acute metabolic encephalopathy Likely secondary to volume depletion, DKA and polysubstance abuse.  Patient tested positive for THC and cocaine.  Has resolved at this time.  Acute kidney injury Creatinine on presentation was 1.3.  Has improved with IV fluids.  Creatinine today at 0.7.  Encouraged oral hydration on discharge   Hyponatremia - Sodium of 122 on admission, improved to 128 when corrected for hyperglycemia.  Sodium today at 127.  Likely hypovolemic hyponatremia.  Monitor as outpatient.   MDD with psychosis  Schizoaffective disorder, bipolar type  Generalized Anxiety Disorder - Patient with long history of untreated mood disorder due to medication noncompliance and polysubstance abuse.  Supposed to be on benztropine  0.5 mg twice daily, Haldol  100 mg injection every 30 days, trazodone  50 mg at bedtime and hydroxyzine  25 mg 3 times daily as needed for anxiety.  Since patient is noncompliant to treatment, has been a significant barrier to management of his diabetes.  Substance use disorder Patient is positive for cocaine and THC.  Counseling done.  Disposition.  At this time, patient is stable for disposition home with outpatient PCP follow-up  Medical Consultants:   None.  Procedures:    None Subjective:   Today, patient was seen and examined at bedside.  Denies any dizziness lightheadedness nausea vomiting fever chills or rigor.  Discharge Exam:   Vitals:   06/22/24 0900 06/22/24 1000  BP: (!) 146/104 (!) 168/97  Pulse: 84 91  Resp: 12 17  Temp:    SpO2: 100% 100%   Vitals:   06/22/24 0700  06/22/24 0800 06/22/24 0900 06/22/24 1000  BP: (!) 143/96 (!) 151/101 (!) 146/104 (!) 168/97  Pulse: 81 79 84 91  Resp: 11 (!) 9 12 17   Temp:  98.6 F (37 C)    TempSrc:  Oral    SpO2: 99% 99% 100% 100%  Weight:      Height:       Body mass index is 18.88 kg/m.   General: Alert awake, not in obvious distress, thinly built HENT: pupils equally reacting to light,  No scleral pallor or icterus noted. Oral mucosa is moist.  Chest:  Clear breath sounds.  Diminished breath sounds bilaterally. No crackles or wheezes.  CVS: S1 &S2 heard. No murmur.  Regular rate and rhythm. Abdomen: Soft, nontender, nondistended.  Bowel sounds are heard.   Extremities: No cyanosis, clubbing or edema.  Peripheral pulses are palpable. Psych: Alert, awake and oriented, normal mood CNS:  No cranial nerve deficits.  Power equal in all extremities.   Skin: Warm and dry.  No rashes noted.  The results of significant diagnostics from this hospitalization (including imaging, microbiology, ancillary and laboratory) are listed below for reference.     Diagnostic Studies:   DG Chest Portable 1 View Result Date: 06/21/2024 CLINICAL DATA:  Shortness of breath. EXAM: PORTABLE CHEST 1 VIEW COMPARISON:  Five days ago 06/16/2024 FINDINGS: The cardiomediastinal contours are normal. The lungs are clear. Pulmonary vasculature is normal. No consolidation, pleural effusion, or pneumothorax. No acute osseous abnormalities are seen. IMPRESSION: No active disease. Electronically Signed   By: Andrea Gasman M.D.   On: 06/21/2024 18:01     Labs:   Basic Metabolic Panel: Recent Labs  Lab 06/17/24 1210 06/21/24 1641 06/21/24 1653 06/21/24 2046 06/22/24 0046 06/22/24 0324  NA 130* 122* 121* 131* 129* 127*  K 3.5 4.9 4.8 3.7 2.9* 3.1*  CL 98 88* 91* 94* 95* 93*  CO2 25 15*  --  23 25 24   GLUCOSE 233* 448* 498* 87 75 71  BUN 11 30* 30* 22* 20 19  CREATININE 1.06 1.31* 1.10 0.80 0.82 0.78  CALCIUM  8.7* 10.1  --  9.7 9.4  9.3  MG  --   --   --   --   --  2.1   GFR Estimated Creatinine Clearance: 108.8 mL/min (by C-G formula based on SCr of 0.78 mg/dL). Liver Function Tests: Recent Labs  Lab 06/16/24 1742 06/21/24 1641  AST 35 13*  ALT 39 24  ALKPHOS 111 104  BILITOT 2.2* 1.6*  PROT 8.7* 9.3*  ALBUMIN 4.6 4.4   No results for input(s): LIPASE, AMYLASE in the last 168 hours. No results for input(s): AMMONIA in the last 168 hours. Coagulation profile No results for input(s): INR, PROTIME in the last 168 hours.  CBC: Recent Labs  Lab 06/16/24 1742 06/16/24 1749 06/16/24 2108 06/17/24 0439 06/21/24 1641 06/21/24 1653 06/22/24 0324  WBC 33.0*  --   --  18.8* 10.9*  --  9.1  NEUTROABS 29.0*  --   --   --  8.7*  --   --   HGB 16.1   < > 13.6 12.8* 15.8 17.3* 13.1  HCT 51.7   < > 40.0  37.0* 45.6 51.0 39.1  MCV 93.5  --   --  84.1 82.9  --  83.2  PLT 575*  --   --  435* 397  --  316   < > = values in this interval not displayed.   Cardiac Enzymes: No results for input(s): CKTOTAL, CKMB, CKMBINDEX, TROPONINI in the last 168 hours. BNP: Invalid input(s): POCBNP CBG: Recent Labs  Lab 06/22/24 0233 06/22/24 0335 06/22/24 0449 06/22/24 0523 06/22/24 0817  GLUCAP 95 77 56* 153* 71   D-Dimer No results for input(s): DDIMER in the last 72 hours. Hgb A1c No results for input(s): HGBA1C in the last 72 hours. Lipid Profile No results for input(s): CHOL, HDL, LDLCALC, TRIG, CHOLHDL, LDLDIRECT in the last 72 hours. Thyroid  function studies No results for input(s): TSH, T4TOTAL, T3FREE, THYROIDAB in the last 72 hours.  Invalid input(s): FREET3 Anemia work up No results for input(s): VITAMINB12, FOLATE, FERRITIN, TIBC, IRON, RETICCTPCT in the last 72 hours. Microbiology Recent Results (from the past 240 hours)  Blood culture (routine x 2)     Status: None   Collection Time: 06/16/24  9:18 PM   Specimen: BLOOD RIGHT ARM  Result  Value Ref Range Status   Specimen Description BLOOD RIGHT ARM  Final   Special Requests   Final    BOTTLES DRAWN AEROBIC AND ANAEROBIC Blood Culture adequate volume   Culture   Final    NO GROWTH 5 DAYS Performed at [facility], [address] [place], [place] [address]    Report Status 06/21/2024 FINAL  Final  Blood culture (routine x 2)     Status: None   Collection Time: 06/16/24  9:20 PM   Specimen: BLOOD LEFT ARM  Result Value Ref Range Status   Specimen Description BLOOD LEFT ARM  Final   Special Requests   Final    BOTTLES DRAWN AEROBIC AND ANAEROBIC Blood Culture adequate volume   Culture   Final    NO GROWTH 5 DAYS Performed at [facility], [address], [place], [place] [address]    Report Status 06/21/2024 FINAL  Final  MRSA Next Gen by PCR, Nasal     Status: Abnormal   Collection Time: 06/21/24  7:58 PM   Specimen: Nasal Mucosa; Nasal Swab  Result Value Ref Range Status   MRSA by PCR Next Gen DETECTED (A) NOT DETECTED Final    Comment: (NOTE) The GeneXpert MRSA Assay (FDA approved for NASAL specimens only), is one component of a comprehensive MRSA colonization surveillance program. It is not intended to diagnose MRSA infection nor to guide or monitor treatment for MRSA infections. Test performance is not FDA approved in patients less than 69 years old. Performed at [facility], [address], [place], [place] [address]      Discharge Instructions:   Discharge Instructions     Call MD for:  difficulty breathing, headache or visual disturbances   Complete by: As directed    Call MD for:  persistant nausea and vomiting   Complete by: As directed    Call MD for:  temperature >100.4   Complete by: As directed    Diet Carb Modified   Complete by: As directed    Discharge instructions   Complete by: As directed    Take all medications from home.  Take insulin  without interruption.  Follow-up with your primary care provider  in 1 week.  Please no smoking or use any illicit substances.  Seek medical attention for worsening symptoms.   Increase activity slowly   Complete by: As directed       Allergies as of 06/22/2024   No Known Allergies      Medication List     TAKE these medications    Accu-Chek Softclix Lancets lancets 1 each in the morning, at noon, and at bedtime. May substitute to any manufacturer covered by patient's insurance.   benztropine  0.5 MG tablet Commonly known as: COGENTIN  Take 1 tablet (0.5 mg total) by mouth 2 (two) times daily. For prevention of EPS   Blood Glucose Monitor System w/Device Kit Use in the morning, at noon, and at bedtime.   BLOOD GLUCOSE TEST STRIPS Strp Use in the morning, at noon, and at bedtime.   haloperidol  decanoate 100 MG/ML injection Commonly known as: HALDOL  DECANOATE Inject 1 mL (100 mg total) into the muscle every 30 (thirty) days. (Due on 03-09-24): For mood control   hydrOXYzine  25 MG tablet Commonly known as: ATARAX  Take 25 mg by mouth 3 (three) times daily as needed for anxiety.   ibuprofen  200 MG tablet Commonly known as: ADVIL  Take 400 mg by mouth 2 (two) times daily as needed for headache or moderate pain (pain score 4-6).   Lancet Device Misc 1 each by Does not apply route in the morning, at noon, and at bedtime. May substitute to any manufacturer covered by patient's insurance.   Lantus  SoloStar 100 UNIT/ML Solostar Pen Generic drug: insulin  glargine Inject 30 Units into the skin daily. For diabetes control.   NovoLOG  FlexPen 100 UNIT/ML FlexPen Generic drug: insulin  aspart Inject 7 Units into the skin 3 (three) times daily before meals. For diabetes control   traZODone  50 MG tablet Commonly known as: DESYREL  Take 50 mg by mouth at bedtime.        Follow-up Information     Smucker, Melvenia, MD Follow up in 1 week(s).   Specialty: Internal Medicine Contact information: [address] [place] [address] [place]  [address] [phone]                  Time coordinating discharge: 39 minutes  Signed:  Fredia Chittenden  [facility] 06/22/2024, 2:15 PM

## 2024-06-25 ENCOUNTER — Encounter: Payer: MEDICAID | Admitting: Dietician

## 2024-06-27 ENCOUNTER — Ambulatory Visit: Payer: MEDICAID | Admitting: Dietician

## 2024-06-27 ENCOUNTER — Ambulatory Visit: Payer: MEDICAID | Admitting: Student

## 2024-06-27 ENCOUNTER — Ambulatory Visit: Payer: Self-pay

## 2024-06-27 ENCOUNTER — Encounter: Payer: Self-pay | Admitting: Student

## 2024-06-27 ENCOUNTER — Other Ambulatory Visit (HOSPITAL_COMMUNITY): Payer: Self-pay

## 2024-06-27 VITALS — BP 132/81 | HR 84 | Temp 97.5°F | Ht 69.0 in | Wt 131.8 lb

## 2024-06-27 DIAGNOSIS — E10649 Type 1 diabetes mellitus with hypoglycemia without coma: Secondary | ICD-10-CM | POA: Diagnosis not present

## 2024-06-27 DIAGNOSIS — E1011 Type 1 diabetes mellitus with ketoacidosis with coma: Secondary | ICD-10-CM

## 2024-06-27 DIAGNOSIS — E871 Hypo-osmolality and hyponatremia: Secondary | ICD-10-CM | POA: Diagnosis not present

## 2024-06-27 DIAGNOSIS — E1065 Type 1 diabetes mellitus with hyperglycemia: Secondary | ICD-10-CM

## 2024-06-27 LAB — GLUCOSE, CAPILLARY: Glucose-Capillary: 277 mg/dL — ABNORMAL HIGH (ref 70–99)

## 2024-06-27 MED ORDER — NOVOLOG FLEXPEN 100 UNIT/ML ~~LOC~~ SOPN: 9.0000 [IU] | PEN_INJECTOR | Freq: Three times a day (TID) | SUBCUTANEOUS | 0 refills | Status: DC

## 2024-06-27 MED ORDER — ACCU-CHEK GUIDE TEST VI STRP
1.0000 | ORAL_STRIP | Freq: Three times a day (TID) | 3 refills | Status: AC
  Filled 2024-09-27 – 2024-11-30 (×2): qty 100, 34d supply, fill #0

## 2024-06-27 MED ORDER — LANTUS SOLOSTAR 100 UNIT/ML ~~LOC~~ SOPN: 30.0000 [IU] | PEN_INJECTOR | Freq: Every day | SUBCUTANEOUS | 0 refills | Status: DC

## 2024-06-27 NOTE — Patient Instructions (Addendum)
 Jacob [name],Thank you for allowing me to take part in your care today.  Here are your instructions.  1.  Regarding your diabetes, you should come back in 1 week.  2.  Please bring a log of your sugars at your next visit  3.  We do not have flu shots at this time, when they become available we can give them to you.  4.  Increase your short acting insulin  to 9 units with breakfast, lunch, dinner.  Continue 30 units of long-acting insulin .  5.  I have checked labs today, I will call you with the results.  PLEASE BRING YOUR MEDICATIONS TO EVERY APPOINTMENT  Thank you, Dr. Tobie  If you have any other questions please contact the internal medicine clinic at [phone] If it is after hours, please call the [place] [facility] at [phone] and then ask the person who picks up for the resident on call.

## 2024-06-27 NOTE — Assessment & Plan Note (Signed)
>>  ASSESSMENT AND PLAN FOR UNCONTROLLED TYPE 1 DIABETES MELLITUS WITH HYPOGLYCEMIA WITHOUT COMA (HCC) WRITTEN ON 06/27/2024  8:13 PM BY PATEL, AMAR, DO  Patient recently hospitalized from 06/21/2024-06/22/2024 for DKA.  This likely in setting of nonadherence to medications.  Patient was on Endo tool.  Discharged with normal sugars.  Patient follows up today.  He states he has been taking his medications.  He has been measuring his sugars.  In the morning they are around mid 100s, afternoon they are in the mid 200s, and at dinnertime they are in the mid 300s.  He states he eats about 10 times a day.  This includes Pastas, grits, eggs, sandwiches, M&Ms, skittles, and cereal.  He endorses that he is doing well and has no concerns.  With elevated glucose measures, we will increase his short acting. A1c 1 month ago 14.9.   Plan: - Increase novolog  to 9 units with breakfast, lunch, and dinner  - Continue with lantus  30 units daily  - Patient encourage to log sugars - Return in 1 weeks for insulin  titration

## 2024-06-27 NOTE — Progress Notes (Signed)
 CC: Diabetes follow-up  HPI:  Jacob [name] is a 33 y.o. 33 year old gentleman with past medical history of type 1 diabetes history of DKA who presents to the clinic for diabetes follow-up.  Please see assessment and plan for full HPI.  Past Medical History:  Diagnosis Date   Bipolar 1 disorder (HCC)    History of attempted suicide 2019   Unsuccessful suicide attempt in 2019 with rat poison   Schizoaffective disorder (HCC)    Type 1 diabetes mellitus on insulin  therapy (HCC) 2019   Vitamin D  deficiency 01/03/2024     Current Outpatient Medications:    benztropine  (COGENTIN ) 0.5 MG tablet, Take 1 tablet (0.5 mg total) by mouth 2 (two) times daily. For prevention of EPS (Patient not taking: Reported on 05/04/2024), Disp: 60 tablet, Rfl: 0   Blood Glucose Monitoring Suppl (BLOOD GLUCOSE MONITOR SYSTEM) w/Device KIT, Use in the morning, at noon, and at bedtime., Disp: 1 kit, Rfl: 0   Glucose Blood (BLOOD GLUCOSE TEST STRIPS) STRP, 1 each by In Vitro route in the morning, at noon, and at bedtime., Disp: 90 each, Rfl: 3   haloperidol  decanoate (HALDOL  DECANOATE) 100 MG/ML injection, Inject 1 mL (100 mg total) into the muscle every 30 (thirty) days. (Due on 03-09-24): For mood control, Disp: 1 mL, Rfl: 0   hydrOXYzine  (ATARAX ) 25 MG tablet, Take 25 mg by mouth 3 (three) times daily as needed for anxiety. (Patient not taking: Reported on 05/04/2024), Disp: , Rfl:    ibuprofen  (ADVIL ) 200 MG tablet, Take 400 mg by mouth 2 (two) times daily as needed for headache or moderate pain (pain score 4-6)., Disp: , Rfl:    insulin  aspart (NOVOLOG  FLEXPEN) 100 UNIT/ML FlexPen, Inject 9 Units into the skin 3 (three) times daily before meals. For diabetes control, Disp: 15 mL, Rfl: 0   insulin  glargine (LANTUS  SOLOSTAR) 100 UNIT/ML Solostar Pen, Inject 30 Units into the skin daily. For diabetes control., Disp: 15 mL, Rfl: 0   traZODone  (DESYREL ) 50 MG tablet, Take 50 mg by mouth at bedtime. (Patient not  taking: Reported on 05/04/2024), Disp: , Rfl:   Review of Systems:    Negative except for what is stated in HPI  Physical Exam:  Vitals:   06/27/24 1033  BP: 132/81  Pulse: 84  Temp: (!) 97.5 F (36.4 C)  TempSrc: Oral  SpO2: 100%  Weight: 131 lb 12.8 oz (59.8 kg)  Height: 5' 9 (1.753 m)   General: Patient is sitting comfortably in the room  Head: Normocephalic, atraumatic  Cardio: Regular rate and rhythm, no murmurs, rubs or gallops. 2+ pulses to bilateral Pulmonary: Clear to ausculation bilaterally with no rales, rhonchi, and crackles    Assessment & Plan:   Uncontrolled type 1 diabetes mellitus with hypoglycemia without coma [facility]) Patient recently hospitalized from 06/21/2024-06/22/2024 for DKA.  This likely in setting of nonadherence to medications.  Patient was on Endo tool.  Discharged with normal sugars.  Patient follows up today.  He states he has been taking his medications.  He has been measuring his sugars.  In the morning they are around mid 100s, afternoon they are in the mid 200s, and at dinnertime they are in the mid 300s.  He states he eats about 10 times a day.  This includes Pastas, grits, eggs, sandwiches, M&Ms, skittles, and cereal.  He endorses that he is doing well and has no concerns.  With elevated glucose measures, we will increase his short acting. A1c 1 month  ago 14.9.   Plan: - Increase novolog  to 9 units with breakfast, lunch, and dinner  - Continue with lantus  30 units daily  - Patient encourage to log sugars - Return in 1 weeks for insulin  titration   Hyponatremia Patient was discharged with a sodium of 127 on 06/22/2024.  CMP yesterday showed sodium 139.  Resolved.   Plan: - Nothing to do   Patient discussed with Dr. Karna [name], DO Internal Medicine Resident PGY-3

## 2024-06-27 NOTE — Progress Notes (Addendum)
 Diabetes Self Management Education & Support Met with mother and patient briefly today. Jacob [name] states he is taking his insulin  30 units lantus  this am and every morning, 7 units Novolog  before meals three times a day. He uses a meter to monitor his blood sugar which was last used yesterday and read HI per patient. He needs more strips. They just got a box of lantus  pens and he needs a refill of his Novolog .  His mother asks why he is hungry all the time. He eats double portions of food like spaghetti and salad, snacks are M & Ms and skittles. He states he drinks mostly water . His blood sugar is: CBG (last 3)  Recent Labs    06/27/24 1116  GLUCAP 277*   today in the office. Care was discussed with Dr. Tobie. Increased hunger could be due to behaviorqal health medications. Consider changing to Tresiba  for his basal insulin  when they are ready for next refill/as appropriate. He may need an increase in mealtime insulin . Plan: Suggest follow up in 2-4 weeks.   Jacob [name], RD 06/27/2024 11:39 AM.

## 2024-06-27 NOTE — Progress Notes (Deleted)
 CC: ***  HPI:  [name] is a 33 y.o.shortn Along  Noddles   Porkchops Greenbeans and corn   He states that he has a bowl cereal   Wait an houe and eat some skittles  M and Ms   Breakfast:  Pork rinds  Bowl of cearal  Eggs  Grits   Eat 10 tims a day Roomates   Morning takes 30 units of lasng acting   Short acting thoruhgr out4 that day  Morning noon and anigfht s 7   Fiungers sticks  Three t4imes  Morning and noon and nights   Fasting  150s ini themmorning of the past 4 days   Noon  Low 200s   Nights  300s  12 short actinng   Cmp    Past Medical History:  Diagnosis Date   Bipolar 1 disorder (HCC)    History of attempted suicide 2019   Unsuccessful suicide attempt in 2019 with rat poison   Schizoaffective disorder (HCC)    Type 1 diabetes mellitus on insulin  therapy (HCC) 2019   Vitamin D  deficiency 01/03/2024     Current Outpatient Medications:    Accu-Chek Softclix Lancets lancets, 1 each in the morning, at noon, and at bedtime. May substitute to any manufacturer covered by patient's insurance., Disp: 100 each, Rfl: 0   benztropine  (COGENTIN ) 0.5 MG tablet, Take 1 tablet (0.5 mg total) by mouth 2 (two) times daily. For prevention of EPS (Patient not taking: Reported on 05/04/2024), Disp: 60 tablet, Rfl: 0   Blood Glucose Monitoring Suppl (BLOOD GLUCOSE MONITOR SYSTEM) w/Device KIT, Use in the morning, at noon, and at bedtime., Disp: 1 kit, Rfl: 0   Glucose Blood (BLOOD GLUCOSE TEST STRIPS) STRP, Use in the morning, at noon, and at bedtime., Disp: 100 strip, Rfl: 0   haloperidol  decanoate (HALDOL  DECANOATE) 100 MG/ML injection, Inject 1 mL (100 mg total) into the muscle every 30 (thirty) days. (Due on 03-09-24): For mood control, Disp: 1 mL, Rfl: 0   hydrOXYzine  (ATARAX ) 25 MG tablet, Take 25 mg by mouth 3 (three) times daily as needed for anxiety. (Patient not taking: Reported on 05/04/2024), Disp: , Rfl:    ibuprofen  (ADVIL ) 200 MG tablet,  Take 400 mg by mouth 2 (two) times daily as needed for headache or moderate pain (pain score 4-6)., Disp: , Rfl:    insulin  aspart (NOVOLOG  FLEXPEN) 100 UNIT/ML FlexPen, Inject 7 Units into the skin 3 (three) times daily before meals. For diabetes control, Disp: 15 mL, Rfl: 0   insulin  glargine (LANTUS  SOLOSTAR) 100 UNIT/ML Solostar Pen, Inject 30 Units into the skin daily. For diabetes control., Disp: 15 mL, Rfl: 0   Lancet Device MISC, 1 each by Does not apply route in the morning, at noon, and at bedtime. May substitute to any manufacturer covered by patient's insurance., Disp: 1 each, Rfl: 0   traZODone  (DESYREL ) 50 MG tablet, Take 50 mg by mouth at bedtime. (Patient not taking: Reported on 05/04/2024), Disp: , Rfl:   Review of Systems:  ***  Constitutional: Eye: Respiratory: Cardiovascular: GI: MSK: GU: Skin: Neuro: Endocrine:   Physical Exam:  There were no vitals filed for this visit. *** General: Patient is sitting comfortably in the room  Eyes: Pupils equal and reactive to light, EOM intact  Head: Normocephalic, atraumatic  Neck: Supple, nontender, full range of motion, No JVD Cardio: Regular rate and rhythm, no murmurs, rubs or gallops. 2+ pulses to bilateral upper and lower extremities  Chest: No  chest tenderness Pulmonary: Clear to ausculation bilaterally with no rales, rhonchi, and crackles  Abdomen: Soft, nontender with normoactive bowel sounds with no rebound or guarding  Neuro: Alert and orientated x3. CN II-XII intact. Sensation intact to upper and lower extremities. 2+ patellar reflex.  Back: No midline tenderness, no step off or deformities noted. No paraspinal muscle tenderness.  Skin: No rashes noted  MSK: 5/5 strength to upper and lower extremities.    Assessment & Plan:   No problem-specific Assessment & Plan notes found for this encounter.    Patient {GC/GE:[id number]::discussed with,seen with} Dr.  {WJFZD:[id number]::Hlpoonli,Ynqqfjw,Floozw,Wjmzwimj,Tpoopjfd,Cpwrzwu}  [name], DO Internal Medicine Resident PGY-3

## 2024-06-27 NOTE — Assessment & Plan Note (Addendum)
 Patient recently hospitalized from 06/21/2024-06/22/2024 for DKA.  This likely in setting of nonadherence to medications.  Patient was on Endo tool.  Discharged with normal sugars.  Patient follows up today.  He states he has been taking his medications.  He has been measuring his sugars.  In the morning they are around mid 100s, afternoon they are in the mid 200s, and at dinnertime they are in the mid 300s.  He states he eats about 10 times a day.  This includes Pastas, grits, eggs, sandwiches, M&Ms, skittles, and cereal.  He endorses that he is doing well and has no concerns.  With elevated glucose measures, we will increase his short acting. A1c 1 month ago 14.9.   Plan: - Increase novolog  to 9 units with breakfast, lunch, and dinner  - Continue with lantus  30 units daily  - Patient encourage to log sugars - Return in 1 weeks for insulin  titration

## 2024-06-28 ENCOUNTER — Telehealth: Payer: Self-pay | Admitting: Student

## 2024-06-28 ENCOUNTER — Encounter: Payer: Self-pay | Admitting: Student

## 2024-06-28 DIAGNOSIS — E1065 Type 1 diabetes mellitus with hyperglycemia: Secondary | ICD-10-CM

## 2024-06-28 LAB — COMPREHENSIVE METABOLIC PANEL WITH GFR
ALT: 17 IU/L (ref 0–44)
AST: 21 IU/L (ref 0–40)
Albumin: 3.7 g/dL — ABNORMAL LOW (ref 4.1–5.1)
Alkaline Phosphatase: 83 IU/L (ref 44–121)
BUN/Creatinine Ratio: 11 (ref 9–20)
BUN: 8 mg/dL (ref 6–20)
Bilirubin Total: <0.2 mg/dL (ref 0.0–1.2)
CO2: 19 mmol/L — ABNORMAL LOW (ref 20–29)
Calcium: 9 mg/dL (ref 8.7–10.2)
Chloride: 98 mmol/L (ref 96–106)
Creatinine, Ser: 0.71 mg/dL — ABNORMAL LOW (ref 0.76–1.27)
Globulin, Total: 2.1 g/dL (ref 1.5–4.5)
Glucose: 273 mg/dL — ABNORMAL HIGH (ref 70–99)
Potassium: 4.4 mmol/L (ref 3.5–5.2)
Sodium: 139 mmol/L (ref 134–144)
Total Protein: 5.8 g/dL — ABNORMAL LOW (ref 6.0–8.5)
eGFR: 125 mL/min/1.73 (ref >59)

## 2024-06-28 NOTE — Assessment & Plan Note (Addendum)
 Patient was discharged with a sodium of 127 on 06/22/2024.  CMP yesterday showed sodium 139.  Resolved.   Plan: - Nothing to do

## 2024-06-28 NOTE — Telephone Encounter (Signed)
 Discussed CMP with patient's mother.  Glucose elevated at 273.  Sodium levels normalized.

## 2024-06-29 NOTE — Progress Notes (Signed)
 Internal Medicine Clinic Attending  Case discussed with the resident at the time of the visit.  We reviewed the resident's history and exam and pertinent patient test results.  I agree with the assessment, diagnosis, and plan of care documented in the resident's note.

## 2024-07-05 ENCOUNTER — Other Ambulatory Visit: Payer: Self-pay

## 2024-07-05 ENCOUNTER — Other Ambulatory Visit: Payer: MEDICAID

## 2024-07-05 ENCOUNTER — Other Ambulatory Visit (HOSPITAL_COMMUNITY): Payer: Self-pay

## 2024-07-05 ENCOUNTER — Telehealth: Payer: Self-pay

## 2024-07-05 DIAGNOSIS — E1065 Type 1 diabetes mellitus with hyperglycemia: Secondary | ICD-10-CM

## 2024-07-05 MED ORDER — TRESIBA FLEXTOUCH 100 UNIT/ML ~~LOC~~ SOPN: 30.0000 [IU] | PEN_INJECTOR | Freq: Every day | SUBCUTANEOUS | 3 refills | Status: DC

## 2024-07-05 MED ORDER — TRESIBA FLEXTOUCH 100 UNIT/ML ~~LOC~~ SOPN
30.0000 [IU] | PEN_INJECTOR | Freq: Every day | SUBCUTANEOUS | 3 refills | Status: DC
  Filled 2024-07-05: qty 30, 86d supply, fill #0

## 2024-07-05 NOTE — Telephone Encounter (Signed)
 Dexcom G7 Sensors covered at no charge.   Submitting PA for Dexcom G7 Reader.   Prior authorization submitted for DEXCOM G7 RECEIVER to [employer] via Latent.   Key: [employer]

## 2024-07-05 NOTE — Telephone Encounter (Signed)
-----   Message from Lorain SHAUNNA Baseman sent at 07/05/2024 10:57 AM EDT ----- Erskin Gammons - when you are able, could you please help me submit a PA for [facility] G7 Receiver and Sensors. I know a PA is needed for the receiver, but I didn't do a test claim for the sensors yet so that may already have been completed. I want to complete the PA before sending a prescription to see if insurance will cover a new reader for him. He has T1DM. My note is complete if needed for additional documentation purposes. Please let me know if you need any other information.  Lorain Baseman, PharmD [facility] [phone]

## 2024-07-05 NOTE — Progress Notes (Signed)
 07/05/2024 Name: Jacob [name] MRN: [id number] DOB: 08-Nov-1990  Chief Complaint  Patient presents with   Diabetes    Jacob [name] is a 33 y.o. year old male who presented for a telephone visit. Patient's mother provides the history, but patient is also present on the call.   They were referred to the pharmacist by a quality report for assistance in managing diabetes in the setting of frequent hospital admissions. PMH includes T1DM with recurrent DKA, depression, schizoaffective disorder, tobacco use.   Subjective: Patient was recently admitted at [facility] from 06/21/24 to 06/22/24 for DKA. Toxicology was + for [name] and cocaine. Patient was last seen by PCP, Jacob Blanch, DO, on 06/27/24 for hospital follow-up. At last visit, patient reported he had been taking his medications and monitoring his sugars. Reports FBG of mid 100s, afternoon BG in the mid-200s, pre-dinner mid-300s mg/dL. He was instructed to increase Novolog  to 9 units with breakfast, lunch, and dinner and continue Lantus  30 units daily. He was also seen by CDCES, Jacob [name] in clinic. Recommended to switch to Tresiba  for basal insulin  when they are ready for subsequent refills.  Today, patient's mother reports that they have misplaced Lantus  and the pharmacy has not been able to override it to fill today. Reports that he took Lantus  this AM but now is out. Reports that he has increased Novolog  to 9 units TID. Reports patient is feeling ok - endorses fatigue. Denies n/v. His mother is concerned that he continues to eat things he should not be eating. Reports they do not believe they have any sensors remaining at home, but he does not have a cell phone so would not be able to connect it to his phone. He would be willing to use a reader device.    Care Team: Primary Care Provider: [name], DO and Jacob [name], RD ; Next Scheduled Visit: 07/31/24   Medication Access/Adherence  Current Pharmacy:  [id number] [place], [place] - [address] [facility] [address] [place] [address] Phone: [phone] Fax: [phone]  [name] [address] [place] [address] Phone: [phone] Fax: [phone]  [name] [address] [place] [address] Phone: [phone] Fax: [phone]  [name] [address] [place] [address] Phone: [phone] Fax: [phone]   Patient reports affordability concerns with their medications: Yes  - unable to fill Lantus  (picked up on 8/25, but unable to find and [facility]) Patient reports access/transportation concerns to their pharmacy: No  - reports she would be able to pick up medication at [facility] if available today Patient reports adherence concerns with their medications:  Yes  - patient has a longstanding hx of nonadherence to insulin  and psych medications  Type 1 Diabetes:  Current medications: Lantus  30 units daily, Novolog  9 units TID with meals  Current glucose readings: unable to share specific readings today. Reports he would be interested in resuming use of Dexcom device, though he would need to use a reader since he does not have a cell phone right now.  Per test claim - reader for Dexcom G7 requires a PA. Insurance last dispensed reader for G6 in Jan 2025.  Patient denies hypoglycemic s/sx including dizziness, shakiness, sweating. Patient reports hyperglycemic symptoms including fatigue.   Current meal patterns: Eats ~10 times per day - includes pastas, grits, eggs, sandwiches, M&Ms,  skittles, and cereal.   Current physical activity: did not discuss today  Current medication access support: Medicaid insurance ([employer])   Objective:  BP Readings from Last 3 Encounters:  06/27/24 132/81  06/22/24 (!) 168/97  06/17/24 118/69    Lab Results  Component Value Date   HGBA1C  14.9 (H) 05/03/2024   HGBA1C 14.5 (H) 02/07/2024   HGBA1C 14.8 (H) 01/28/2024       Latest Ref Rng & Units 06/27/2024   11:33 AM 06/22/2024    3:24 AM 06/22/2024   12:46 AM  BMP  Glucose 70 - 99 mg/dL 726  71  75   BUN 6 - 20 mg/dL 8  19  20    Creatinine 0.76 - 1.27 mg/dL 9.28  9.21  9.17   BUN/Creat Ratio 9 - 20 11     Sodium 134 - 144 mmol/L 139  127  129   Potassium 3.5 - 5.2 mmol/L 4.4  3.1  2.9   Chloride 96 - 106 mmol/L 98  93  95   CO2 20 - 29 mmol/L 19  24  25    Calcium  8.7 - 10.2 mg/dL 9.0  9.3  9.4     Lab Results  Component Value Date   CHOL 201 (H) 01/28/2024   HDL 75 01/28/2024   LDLCALC 109 (H) 01/28/2024   TRIG 85 01/28/2024   CHOLHDL 2.7 01/28/2024    Medications Reviewed Today     Reviewed by Jacob [name], [name] (Pharmacist) on 07/05/24 at 1041  Med List Status: <None>   Medication Order Taking? Sig Documenting Provider Last Dose Status Informant  benztropine  (COGENTIN ) 0.5 MG tablet [id number]  Take 1 tablet (0.5 mg total) by mouth 2 (two) times daily. For prevention of EPS  Patient not taking: Reported on 05/04/2024   Jacob [name] FERNS, NP  Active Self, Pharmacy Records, Multiple Informants  Blood Glucose Monitoring Suppl (BLOOD GLUCOSE MONITOR SYSTEM) w/Device KIT [id number]  Use in the morning, at noon, and at bedtime. Jacob Jacob SAILOR, [name]  Active Self, Pharmacy Records, Multiple Informants           Med Note ([name], Jacob [name] Jun 17, 2024  6:46 AM) Jacob [name] 05/28/24  Glucose Blood (BLOOD GLUCOSE TEST STRIPS) STRP [id number]  1 each by In Vitro route in the morning, at noon, and at bedtime. Jacob Gaines, DO  Active   haloperidol  decanoate (HALDOL  DECANOATE) 100 MG/ML injection [id number]  Inject 1 mL (100 mg total) into the muscle every 30 (thirty) days. (Due on 03-09-24): For mood control Jacob [name] I, NP  Active Self, Pharmacy Records, Multiple Informants           Med Note ([name], Jacob [name])    hydrOXYzine  (ATARAX ) 25 MG tablet  [id number]  Take 25 mg by mouth 3 (three) times daily as needed for anxiety.  Patient not taking: Reported on 05/04/2024   [provider]  Active Self, Pharmacy Records, Multiple Informants  ibuprofen  (ADVIL ) 200 MG tablet [id number]  Take 400 mg by mouth 2 (two) times daily as needed for headache or moderate pain (pain score 4-6). [provider]  Active Self, Pharmacy Records, Multiple Informants  insulin  aspart (NOVOLOG  FLEXPEN) 100 UNIT/ML FlexPen [id number] Yes Inject 9 Units into the skin 3 (three) times daily before meals. For diabetes control Jacob Gaines, DO  Active   insulin  degludec (TRESIBA  FLEXTOUCH) 100 UNIT/ML FlexTouch Pen [id number] Yes Inject 30 Units into the  skin daily. May increase up to 35 units daily if instructed by your provider. Jacob Gaines, DO  Active     Discontinued 07/05/24 1021 (Change in therapy)            Med Note>> Jacob [name], [facility]   07/05/2024 10:21 AM Lost medication - switching to Tresiba  for efficacy/coverage    traZODone  (DESYREL ) 50 MG tablet [id number]  Take 50 mg by mouth at bedtime.  Patient not taking: Reported on 05/04/2024   [provider]  Active Self, Pharmacy Records, Multiple Informants  Med List Note Steffi Nian, [place] 06/22/24 9145): Multiple social barriers impact patients compliance/accurate dispense history which may lead to inaccuracy of what the patient should or should not be taking at home.                 Assessment/Plan:   Diabetes: - Currently uncontrolled with most recent A1C of 14.9% above goal <7%. Medication adherence appears inappropriate. His mother assists with medication management, but reports that she is not able to locate the Lantus  that was picked up on 06/25/24. She is agreeable to switching to Tresiba , which is also more appropriate treatment for patient given long duration of action, which may help prevent DKA if patient has a delay in administering his dose. Denies s/sx of hypoglycemia  with recent increase in Novolog . - Last UACR July 2025: < 5 mg/g - Reviewed goal A1c, goal fasting, and goal 2 hour post prandial glucose - Reviewed hypoglycemia management plan and the rule of 15 - Reviewed dietary modifications including  utilizing the healthy plate method, limiting portion size of carbohydrate foods, increasing intake of protein and non-starchy vegetables. Counseled patient to stay hydrated with water  throughout the day. - Recommend to switch Lantus  to Tresiba  30 units daily for insurance access and longer duration of action. Collaborated with PCP to place orders. Patient's mother agreeable to pick [facility]  - Recommend to continue Novolog  9 units TID with meals. Patient's mother confirms that she has ~2 boxes of this remaining in the fridge. - Recommend to check glucose twice daily: fasting and 2-hr PPG . Counseled patient to bring glucometer or BG log to every appointment. Will collaborate with CPhT to complete PA for Dexcom G7 sensors and reader - Next A1C due October 2025  [id number]    Written patient instructions provided. Patient verbalized understanding of treatment plan.   Follow Up Plan:  Pharmacist telephone 07/26/24 PCP/CDCES clinic visit in 07/31/24   Lorain Jacob, PharmD [facility] [phone]

## 2024-07-06 ENCOUNTER — Other Ambulatory Visit (HOSPITAL_COMMUNITY): Payer: Self-pay

## 2024-07-06 NOTE — Telephone Encounter (Signed)
 Pharmacy Patient Advocate Encounter  Received notification from [facility] MEDICAID that Prior Authorization for [facility] G7 RECEIVER has been APPROVED from 07/05/24 to 01/03/24   PA #/Case ID/Reference #: [id number]

## 2024-07-06 NOTE — Telephone Encounter (Signed)
 Per test claim - Dexcom G7 receiver and sensors are covered by insurance for a $0 copay. Will outreach patient to discuss initiating sensor/receiver next week.   Confirmed that patient picked up Tresiba  (86 day supply) from [facility] pharmacy yesterday.   Lorain Baseman, PharmD [facility] [phone]

## 2024-07-09 ENCOUNTER — Ambulatory Visit (HOSPITAL_COMMUNITY)
Admission: EM | Admit: 2024-07-09 | Discharge: 2024-07-09 | Disposition: A | Discharge status: Home / Self Care | Payer: MEDICAID | Attending: Psychiatry | Admitting: Psychiatry

## 2024-07-09 ENCOUNTER — Other Ambulatory Visit: Payer: Self-pay

## 2024-07-09 ENCOUNTER — Other Ambulatory Visit: Payer: MEDICAID

## 2024-07-09 ENCOUNTER — Emergency Department (HOSPITAL_COMMUNITY)
Admission: EM | Admit: 2024-07-09 | Discharge: 2024-07-09 | Disposition: A | Discharge status: Home / Self Care | Payer: MEDICAID | Attending: Emergency Medicine | Admitting: Emergency Medicine

## 2024-07-09 ENCOUNTER — Encounter (HOSPITAL_COMMUNITY): Payer: Self-pay

## 2024-07-09 DIAGNOSIS — E119 Type 2 diabetes mellitus without complications: Secondary | ICD-10-CM | POA: Diagnosis not present

## 2024-07-09 DIAGNOSIS — F32A Depression, unspecified: Secondary | ICD-10-CM | POA: Insufficient documentation

## 2024-07-09 DIAGNOSIS — Z5900 Homelessness unspecified: Secondary | ICD-10-CM | POA: Insufficient documentation

## 2024-07-09 DIAGNOSIS — E109 Type 1 diabetes mellitus without complications: Secondary | ICD-10-CM | POA: Insufficient documentation

## 2024-07-09 DIAGNOSIS — F419 Anxiety disorder, unspecified: Secondary | ICD-10-CM | POA: Insufficient documentation

## 2024-07-09 DIAGNOSIS — F259 Schizoaffective disorder, unspecified: Secondary | ICD-10-CM | POA: Insufficient documentation

## 2024-07-09 DIAGNOSIS — F4321 Adjustment disorder with depressed mood: Secondary | ICD-10-CM | POA: Insufficient documentation

## 2024-07-09 DIAGNOSIS — Z794 Long term (current) use of insulin: Secondary | ICD-10-CM | POA: Insufficient documentation

## 2024-07-09 DIAGNOSIS — F411 Generalized anxiety disorder: Secondary | ICD-10-CM | POA: Insufficient documentation

## 2024-07-09 DIAGNOSIS — Z79899 Other long term (current) drug therapy: Secondary | ICD-10-CM | POA: Insufficient documentation

## 2024-07-09 LAB — CBC
HCT: 39.6 % (ref 39.0–52.0)
Hemoglobin: 12.8 g/dL — ABNORMAL LOW (ref 13.0–17.0)
MCH: 28.4 pg (ref 26.0–34.0)
MCHC: 32.3 g/dL (ref 30.0–36.0)
MCV: 87.8 fL (ref 80.0–100.0)
Platelets: 397 K/uL (ref 150–400)
RBC: 4.51 MIL/uL (ref 4.22–5.81)
RDW: 13.4 % (ref 11.5–15.5)
WBC: 6.1 K/uL (ref 4.0–10.5)
nRBC: 0 % (ref 0.0–0.2)

## 2024-07-09 LAB — COMPREHENSIVE METABOLIC PANEL WITH GFR
ALT: 17 U/L (ref 0–44)
AST: 18 U/L (ref 15–41)
Albumin: 3.6 g/dL (ref 3.5–5.0)
Alkaline Phosphatase: 83 U/L (ref 38–126)
Anion gap: 12 (ref 5–15)
BUN: 9 mg/dL (ref 6–20)
CO2: 26 mmol/L (ref 22–32)
Calcium: 9.4 mg/dL (ref 8.9–10.3)
Chloride: 91 mmol/L — ABNORMAL LOW (ref 98–111)
Creatinine, Ser: 0.84 mg/dL (ref 0.61–1.24)
GFR, Estimated: >60 mL/min (ref >60)
Glucose, Bld: 627 mg/dL (ref 70–99)
Potassium: 4.3 mmol/L (ref 3.5–5.1)
Sodium: 129 mmol/L — ABNORMAL LOW (ref 135–145)
Total Bilirubin: 0.7 mg/dL (ref 0.0–1.2)
Total Protein: 7 g/dL (ref 6.5–8.1)

## 2024-07-09 LAB — RAPID URINE DRUG SCREEN, HOSP PERFORMED
Amphetamines: NOT DETECTED
Barbiturates: NOT DETECTED
Benzodiazepines: NOT DETECTED
Cocaine: POSITIVE — AB
Opiates: NOT DETECTED
Tetrahydrocannabinol: POSITIVE — AB

## 2024-07-09 LAB — CBG MONITORING, ED
Glucose-Capillary: >600 mg/dL (ref 70–99)
Glucose-Capillary: 433 mg/dL — ABNORMAL HIGH (ref 70–99)

## 2024-07-09 LAB — ETHANOL: Alcohol, Ethyl (B): <15 mg/dL (ref <15)

## 2024-07-09 MED ORDER — INSULIN ASPART 100 UNIT/ML IJ SOLN
12.0000 [IU] | Freq: Once | INTRAMUSCULAR | Status: AC
  Administered 2024-07-09: 12 [IU] via SUBCUTANEOUS

## 2024-07-09 MED ORDER — SODIUM CHLORIDE 0.9 % IV BOLUS
1000.0000 mL | Freq: Once | INTRAVENOUS | Status: AC
  Administered 2024-07-09: 1000 mL via INTRAVENOUS

## 2024-07-09 NOTE — ED Notes (Signed)
 Unable to provide urine, Pt requested water 

## 2024-07-09 NOTE — Discharge Summary (Signed)
 Jacob [name] to be discharged Home per NP order. An After Visit Summary was printed and given to the patient. Patient escorted out and discharged home via private auto.  Jacob [name]  07/09/2024 11:40 AM

## 2024-07-09 NOTE — Progress Notes (Signed)
   07/09/24 1053  [facility] Triage Screening (Walk-ins at [facility] only)  How Did You Hear About Us ? Self  What Is the Reason for Your Visit/Call Today? Thau is a 33 year old male presenting to [facility] as a vol walkin. Pt states that he is feeling depressed and anxious due to him being homless for the past 2 years. Pt denies SI, HI, AVH, and substance use. Pt states he is here today because he needs somewhere to sleep until he gets his disability check. Pt has a mental diagnosis of schizophrenia, bipolar, and personality disorder. Pt is also currently seeing a therapist at [facility].  How Long Has This Been Causing You Problems? > than 6 months  Have You Recently Had Any Thoughts About Hurting Yourself? No  Are You Planning to Commit Suicide/Harm Yourself At This time? No  Have you Recently Had Thoughts About Hurting Someone Sherral? No  Are You Planning To Harm Someone At This Time? No  Physical Abuse Denies  Verbal Abuse Denies  Sexual Abuse Denies  Exploitation of patient/patient's resources Denies  Self-Neglect Denies  Are you currently experiencing any auditory, visual or other hallucinations? No  Have You Used Any Alcohol or Drugs in the Past 24 Hours? No  Do you have any current medical co-morbidities that require immediate attention? No  What Do You Feel Would Help You the Most Today? Housing Assistance  Determination of Need Routine (7 days)  Options For Referral Outpatient Therapy;Other: Comment Artist)

## 2024-07-09 NOTE — ED Notes (Signed)
 Patient CBG 433. Patient ambulated to restroom independently without incident. MD notified.

## 2024-07-09 NOTE — ED Provider Notes (Signed)
 [place] EMERGENCY DEPARTMENT AT [facility] Provider Note   CSN: [id number] Arrival date & time: 07/09/24  1150     Patient presents with: Depression and Anxiety   Jacob [name] is a 33 y.o. male.    Depression  Anxiety  Patient is a 33 year old male with past medical history significant for anxiety depression and feeling dehydrated.  He is a diabetic and states he has been using his insulin  regularly.  He states he has not used insulin  today however.  He denies any nausea vomiting fevers chest pain difficulty breathing or any other symptoms apart from his anxiety and depression.  He is not suicidal homicidal or endorsing hallucinations.     Prior to Admission medications   Medication Sig Start Date End Date Taking? Authorizing Provider  benztropine  (COGENTIN ) 0.5 MG tablet Take 1 tablet (0.5 mg total) by mouth 2 (two) times daily. For prevention of EPS Patient not taking: Reported on 05/04/2024 02/09/24   Collene Gouge I, NP  Blood Glucose Monitoring Suppl (BLOOD GLUCOSE MONITOR SYSTEM) w/Device KIT Use in the morning, at noon, and at bedtime. 05/28/24   [name], [name]  Glucose Blood (BLOOD GLUCOSE TEST STRIPS) STRP 1 each by In Vitro route in the morning, at noon, and at bedtime. 06/27/24 10/25/24  [name], DO  haloperidol  decanoate (HALDOL  DECANOATE) 100 MG/ML injection Inject 1 mL (100 mg total) into the muscle every 30 (thirty) days. (Due on 03-09-24): For mood control 03/09/24   Collene Gouge I, NP  hydrOXYzine  (ATARAX ) 25 MG tablet Take 25 mg by mouth 3 (three) times daily as needed for anxiety. Patient not taking: Reported on 05/04/2024    [provider]  ibuprofen  (ADVIL ) 200 MG tablet Take 400 mg by mouth 2 (two) times daily as needed for headache or moderate pain (pain score 4-6).    [provider]  insulin  aspart (NOVOLOG  FLEXPEN) 100 UNIT/ML FlexPen Inject 9 Units into the skin 3 (three) times daily before meals. For diabetes control  06/27/24   [name], DO  insulin  degludec (TRESIBA  FLEXTOUCH) 100 UNIT/ML FlexTouch Pen Inject 30 Units into the skin daily. May increase up to 35 units daily if instructed by your provider. 07/05/24   [name], DO  traZODone  (DESYREL ) 50 MG tablet Take 50 mg by mouth at bedtime. Patient not taking: Reported on 05/04/2024    [provider]    Allergies: Patient has no known allergies.    Review of Systems  Psychiatric/Behavioral:  Positive for depression.     Updated Vital Signs BP (!) 145/92   Pulse 83   Temp 97.9 F (36.6 C)   Resp 14   SpO2 99%   Physical Exam Vitals and nursing note reviewed.  Constitutional:      General: He is not in acute distress. HENT:     Head: Normocephalic and atraumatic.     Nose: Nose normal.     Mouth/Throat:     Mouth: Mucous membranes are dry.  Eyes:     General: No scleral icterus. Cardiovascular:     Rate and Rhythm: Normal rate and regular rhythm.     Pulses: Normal pulses.     Heart sounds: Normal heart sounds.  Pulmonary:     Effort: Pulmonary effort is normal. No respiratory distress.     Breath sounds: No wheezing.  Abdominal:     Palpations: Abdomen is soft.     Tenderness: There is no abdominal tenderness.  Musculoskeletal:  Cervical back: Normal range of motion.     Right lower leg: No edema.     Left lower leg: No edema.  Skin:    General: Skin is warm and dry.     Capillary Refill: Capillary refill takes less than 2 seconds.  Neurological:     Mental Status: He is alert. Mental status is at baseline.  Psychiatric:        Mood and Affect: Mood normal.        Behavior: Behavior normal.     (all labs ordered are listed, but only abnormal results are displayed) Labs Reviewed  COMPREHENSIVE METABOLIC PANEL WITH GFR - Abnormal; Notable for the following components:      Result Value   Sodium 129 (*)    Chloride 91 (*)    Glucose, Bld 627 (*)    All other components within normal limits  CBC -  Abnormal; Notable for the following components:   Hemoglobin 12.8 (*)    All other components within normal limits  RAPID URINE DRUG SCREEN, HOSP PERFORMED - Abnormal; Notable for the following components:   Cocaine POSITIVE (*)    Tetrahydrocannabinol POSITIVE (*)    All other components within normal limits  CBG MONITORING, ED - Abnormal; Notable for the following components:   Glucose-Capillary >600 (*)    All other components within normal limits  CBG MONITORING, ED - Abnormal; Notable for the following components:   Glucose-Capillary 433 (*)    All other components within normal limits  ETHANOL  URINALYSIS, ROUTINE W REFLEX MICROSCOPIC    EKG: None  Radiology: No results found.   Procedures   Medications Ordered in the ED  insulin  aspart (novoLOG ) injection 12 Units (12 Units Subcutaneous Given 07/09/24 1435)  sodium chloride  0.9 % bolus 1,000 mL (0 mLs Intravenous Stopped 07/09/24 1557)                                    Medical Decision Making Amount and/or Complexity of Data Reviewed Labs: ordered.  Risk Prescription drug management.   Patient is a 33 year old male with past medical history significant for anxiety depression and feeling dehydrated.  He is a diabetic and states he has been using his insulin  regularly.  He states he has not used insulin  today however.  He denies any nausea vomiting fevers chest pain difficulty breathing or any other symptoms apart from his anxiety and depression.  He is not suicidal homicidal or endorsing hallucinations.  CMP notable for hyperglycemia 627 patient provided 1 L of normal saline and 12 units of insulin  subcu with improvement to 433, urine drug screen positive for cocaine and THC ethanol undetectably low CBC unremarkable  Patient did not provide urine sample but did use the restroom during ER visit.  He denies any dysuria I do not feel it is necessary to hold patient for UA.  Patient is well-appearing, tolerating p.o.   Feels comfortable with discharge home today to follow-up with [facility].  Return precautions provided.   Final diagnoses:  Anxiety  Depression, unspecified depression type  Type 2 diabetes mellitus without complication, with long-term current use of insulin  [facility])    ED Discharge Orders     None          Neldon Hamp RAMAN, [place] 07/09/24 1627    Armenta Canning, MD 07/09/24 1655

## 2024-07-09 NOTE — Discharge Instructions (Addendum)
 Please follow up with your ACT team and your medical and psychiatric providers regarding your appointments. Enclosed is a list of shelter and housing resources--please reach out to them to explore how they can support your needs. You may also use the phone in the lobby to call your case manager and discuss additional assistance they may be able to provide. Continue to take your medications as prescribed by your providers.  Get help right away if: You have thoughts about hurting yourself or others. Get help right away if you feel like you may hurt yourself or others, or have thoughts about taking your own life. Go to your nearest emergency room or: Call 911. Call the National Suicide Prevention Lifeline at 904-254-9616 or 988 in the [place] This is open 24 hours a day. If you're a Veteran: Call 988 and press 1. This is open 24 hours a day. Text the [employer] at 954-670-1360. Summary Mental health is not just the absence of mental illness. It involves understanding your emotions and behaviors, and taking steps to manage them in a healthy way. If you have symptoms of mental or emotional distress, get help from family, friends, a health care provider, or a mental health professional. Practice good mental health behaviors such as stress management skills, self-calming skills, exercise, healthy sleeping and eating, and supportive relationships. This information is not intended to replace advice given to you by your health care provider. Make sure you discuss any questions you have with your health care provider.  Education provided on the fact that if experiencing worsening of psychiatry symptoms including suicidal ideations, homicidal ideations, or having auditory/visual hallucinations, etc, to call 911, 988, come back to this location, or go to the nearest ER. Pt verbalized understanding.
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
## 2024-07-09 NOTE — Progress Notes (Deleted)
 07/09/2024 Name: Jacob [name] MRN: [id number] DOB: September 01, 1991  No chief complaint on file.   Jacob [name] is a 33 y.o. year old male who presented for a telephone visit. Patient's mother provides the history, but patient is also present on the call.   They were referred to the pharmacist by a quality report for assistance in managing diabetes in the setting of frequent hospital admissions. PMH includes T1DM with recurrent DKA, depression, schizoaffective disorder, tobacco use.   Subjective: Patient was recently admitted at [facility] from 06/21/24 to 06/22/24 for DKA. Toxicology was + for [facility] and cocaine. Patient was last seen by PCP, [name], DO, on 06/27/24 for hospital follow-up. At last visit, patient reported he had been taking his medications and monitoring his sugars. Reports FBG of mid 100s, afternoon BG in the mid-200s, pre-dinner mid-300s mg/dL. He was instructed to increase Novolog  to 9 units with breakfast, lunch, and dinner and continue Lantus  30 units daily. He was also seen by [facility], [name] in clinic. Recommended to switch to Tresiba  for basal insulin  when they are ready for subsequent refills.  Today, patient's mother reports that they have misplaced Lantus  and the pharmacy has not been able to override it to fill today. Reports that he took Lantus  this AM but now is out. Reports that he has increased Novolog  to 9 units TID. Reports patient is feeling ok - endorses fatigue. Denies n/v. His mother is concerned that he continues to eat things he should not be eating. Reports they do not believe they have any sensors remaining at home, but he does not have a cell phone so would not be able to connect it to his phone. He would be willing to use a reader device.    Care Team: Primary Care Provider: [name], DO and [name], RD ; Next Scheduled Visit: 07/31/24   Medication Access/Adherence  Current Pharmacy:  [id number] [place] [place] [facility] [address] [place] [address] Phone: [phone] Fax: [phone]  [name] [address] [place] [address] Phone: [phone] Fax: [phone]  [place] - [facility] [address] [place] [address] Phone: [phone] Fax: [phone]  [name] - [facility] [address] [place] [address] Phone: [phone] Fax: [phone]   Patient reports affordability concerns with their medications: Yes  - unable to fill Lantus  (picked up on 8/25, but unable to find and [facility]) Patient reports access/transportation concerns to their pharmacy: No  - reports she would be able to pick up medication at [facility] if available today Patient reports adherence concerns with their medications:  Yes  - patient has a longstanding hx of nonadherence to insulin  and psych medications  Type 1 Diabetes:  Current medications: Lantus  30 units daily, Novolog  9 units TID with meals  Current glucose readings: unable to share specific readings today. Reports he would be interested in resuming use of Dexcom device, though he would need to use a reader since he does not have a cell phone right now.  Per test claim - reader for Dexcom G7 requires a PA. Insurance last dispensed reader for G6 in Jan 2025.  Patient denies hypoglycemic s/sx including dizziness, shakiness, sweating. Patient reports hyperglycemic symptoms including fatigue.   Current meal patterns: Eats ~10 times per day - includes pastas, grits, eggs, sandwiches, M&Ms, skittles, and cereal.  Current physical activity: did not discuss today  Current medication access support: Medicaid insurance ([employer])   Objective:  BP Readings from Last 3 Encounters:  07/09/24 (!) 145/92  07/09/24 (!) 131/100  06/27/24 132/81    Lab Results  Component Value Date   HGBA1C 14.9 (H) 05/03/2024    HGBA1C 14.5 (H) 02/07/2024   HGBA1C 14.8 (H) 01/28/2024       Latest Ref Rng & Units 07/09/2024   12:07 PM 06/27/2024   11:33 AM 06/22/2024    3:24 AM  BMP  Glucose 70 - 99 mg/dL 372  726  71   BUN 6 - 20 mg/dL 9  8  19    Creatinine 0.61 - 1.24 mg/dL 9.15  9.28  9.21   BUN/Creat Ratio 9 - 20  11    Sodium 135 - 145 mmol/L 129  139  127   Potassium 3.5 - 5.1 mmol/L 4.3  4.4  3.1   Chloride 98 - 111 mmol/L 91  98  93   CO2 22 - 32 mmol/L 26  19  24    Calcium  8.9 - 10.3 mg/dL 9.4  9.0  9.3     Lab Results  Component Value Date   CHOL 201 (H) 01/28/2024   HDL 75 01/28/2024   LDLCALC 109 (H) 01/28/2024   TRIG 85 01/28/2024   CHOLHDL 2.7 01/28/2024    Medications Reviewed Today   Medications were not reviewed in this encounter    Audit from Current Admission   Medications were not reviewed in this encounter       Assessment/Plan:   Diabetes: - Currently uncontrolled with most recent A1C of 14.9% above goal <7%. Medication adherence appears inappropriate. His mother assists with medication management, but reports that she is not able to locate the Lantus  that was picked up on 06/25/24. She is agreeable to switching to Tresiba , which is also more appropriate treatment for patient given long duration of action, which may help prevent DKA if patient has a delay in administering his dose. Denies s/sx of hypoglycemia with recent increase in Novolog . - Last UACR July 2025: < 5 mg/g - Reviewed goal A1c, goal fasting, and goal 2 hour post prandial glucose - Reviewed hypoglycemia management plan and the rule of 15 - Reviewed dietary modifications including  utilizing the healthy plate method, limiting portion size of carbohydrate foods, increasing intake of protein and non-starchy vegetables. Counseled patient to stay hydrated with water  throughout the day. - Recommend to switch Lantus  to Tresiba  30 units daily for insurance access and longer duration of action. Collaborated with PCP to  place orders. Patient's mother agreeable to pick this up [facility] since [facility].  - Recommend to continue Novolog  9 units TID with meals. Patient's mother confirms that she has ~2 boxes of this remaining in the fridge. - Recommend to check glucose twice daily: fasting and 2-hr PPG . Counseled patient to bring glucometer or BG log to every appointment. Will collaborate with CPhT to complete PA for Dexcom G7 sensors and reader - Next A1C due October 2025  67[address]    Written patient instructions provided. Patient verbalized understanding of treatment plan.   Follow Up Plan:  Pharmacist telephone 07/26/24 PCP/CDCES clinic visit in 07/31/24   Lorain Baseman, PharmD [facility] [phone]

## 2024-07-09 NOTE — Discharge Instructions (Addendum)
 Your blood sugar has been improved here in the emergency department please continue to use your short and long-acting insulin  as we discussed.  Make sure you are drinking plenty water .  You may [place] return to the emergency room for any new or concerning symptoms.  Please go to the behavioral health urgent care for walk-in visit.

## 2024-07-09 NOTE — Inpatient Diabetes Management (Signed)
 Inpatient Diabetes Program Recommendations  AACE/ADA: New Consensus Statement on Inpatient Glycemic Control (2015)  Target Ranges:  Prepandial:   less than 140 mg/dL      Peak postprandial:   less than 180 mg/dL (1-2 hours)      Critically ill patients:  140 - 180 mg/dL   Lab Results  Component Value Date   GLUCAP 277 (H) 06/27/2024   HGBA1C 14.9 (H) 05/03/2024    Review of Glycemic Control  Latest Reference Range & Units 07/09/24 12:07  Glucose 70 - 99 mg/dL 372 (HH)   Diabetes history: Type 1 DM  Outpatient Diabetes medications:  Lantus  30 units  Novolog  9 units tid with meals  Current orders for Inpatient glycemic control:  None yet  Inpatient Diabetes Program Recommendations:     Note patient received Novolog  12 units x1 for elevated blood sugar.  If patient will be admitted, needs basal, meal coverage and correction insulin  ordered.  Patient is well known to our team and struggles with mental health, Type 1 DM and homelessness.  It appears he has been working with PCP to get CGM for monitoring (07/05/24). Will follow.   Thanks,  Randall Bullocks, RN, BC-ADM Inpatient Diabetes Coordinator Pager [phone]  (8a-5p)

## 2024-07-09 NOTE — ED Triage Notes (Signed)
 Pt presents with anxiety and depression x 2 years, has been getting worse more recently prompting visit.  Does not endorse SI/HI at this time.

## 2024-07-10 ENCOUNTER — Other Ambulatory Visit: Payer: Self-pay

## 2024-07-12 ENCOUNTER — Telehealth: Payer: Self-pay

## 2024-07-12 ENCOUNTER — Other Ambulatory Visit: Payer: MEDICAID

## 2024-07-12 DIAGNOSIS — E1065 Type 1 diabetes mellitus with hyperglycemia: Secondary | ICD-10-CM

## 2024-07-12 MED ORDER — DEXCOM G7 RECEIVER DEVI: 0 refills | Status: DC

## 2024-07-12 MED ORDER — DEXCOM G7 SENSOR MISC: 11 refills | Status: DC

## 2024-07-12 NOTE — Telephone Encounter (Signed)
 Prior Authorization for patient (Dexcom G6 Sensor) came through on cover my meds was submitted with last office notes and labs awaiting approval or denial.  [id number]

## 2024-07-12 NOTE — Progress Notes (Deleted)
 07/12/2024 Name: Jacob [name] MRN: [id number] DOB: Jan 27, 1991  No chief complaint on file.   Jacob [name] is a 33 y.o. year old male who presented for a telephone visit. Patient's mother provides the history, but patient is also present on the call.   They were referred to the pharmacist by a quality report for assistance in managing diabetes in the setting of frequent hospital admissions. PMH includes T1DM with recurrent DKA, depression, schizoaffective disorder, tobacco use.   Subjective: Patient was recently admitted at [facility] from 06/21/24 to 06/22/24 for DKA. Toxicology was + for [facility] and cocaine. Patient was last seen by PCP, [name], DO, on 06/27/24 for hospital follow-up. At last visit, patient reported he had been taking his medications and monitoring his sugars. Reports FBG of mid 100s, afternoon BG in the mid-200s, pre-dinner mid-300s mg/dL. He was instructed to increase Novolog  to 9 units with breakfast, lunch, and dinner and continue Lantus  30 units daily. He was also seen by CDCES, [name] in clinic. Recommended to switch to Tresiba  for basal insulin  when they are ready for subsequent refills. Patient was engaged by pharmacy via telephone on 07/05/24. His mother reports that they misplaced Lantus  and the pharmacy was not able to override and fill it. He was transitioned to Tresiba , as insurance was allowing a 90 day fill of this. They reported they do not have Dexcom G7 sensors remaining at home, but would be willing to use it with a reader device (as he does not currently have a cell phone). Patient went to the ED on 07/09/24 for anxiety and s/sx of dehydration, but was not admitted.   Today, ***  Today, patient's mother reports that they have misplaced Lantus  and the pharmacy has not been able to override it to fill today. Reports that he took Lantus  this AM but now is out. Reports that he has increased Novolog  to 9 units TID. Reports patient is feeling ok - endorses fatigue. Denies n/v.  His mother is concerned that he continues to eat things he should not be eating. Reports they do not believe they have any sensors remaining at home, but he does not have a cell phone so would not be able to connect it to his phone. He would be willing to use a reader device.   F/u getting Dexcom reader/applying CGM   Care Team: Primary Care Provider: [name], DO and [name], RD ; Next Scheduled Visit: 07/31/24   Medication Access/Adherence  Current Pharmacy:  [id number] [place], [place] - [address] [place] [address] Phone: [phone] Fax: [phone]  [name] [address] [place] [address] Phone: [phone] Fax: [phone]  [place] - [facility] [address] [place] [address] Phone: [phone] Fax: [phone]  [name] - [facility] [address] [place] [address] Phone: [phone] Fax: [phone]   Patient reports affordability concerns with their medications: Yes  - unable to fill Lantus  (picked up on 8/25, but unable to find and [facility]) Patient reports access/transportation concerns to their pharmacy: No  - reports she would be able to pick up medication at [facility] if available today Patient reports adherence concerns with their medications:  Yes  - patient has a longstanding hx of nonadherence to insulin  and psych medications  Type 1 Diabetes:  Current medications: Lantus  30 units daily, Novolog  9  units TID with meals  Current glucose readings: unable to share specific readings today. Reports he would be interested in resuming use of Dexcom device, though he would need to use a reader since he does not have a cell phone right now.  Per test claim - reader for Dexcom G7 requires a PA. Insurance last dispensed reader for G6 in Jan 2025.  Patient  denies hypoglycemic s/sx including dizziness, shakiness, sweating. Patient reports hyperglycemic symptoms including fatigue.   Current meal patterns: Eats ~10 times per day - includes pastas, grits, eggs, sandwiches, M&Ms, skittles, and cereal.   Current physical activity: did not discuss today  Current medication access support: Medicaid insurance ([employer])   Objective:  BP Readings from Last 3 Encounters:  07/09/24 115/80  07/09/24 (!) 131/100  06/27/24 132/81    Lab Results  Component Value Date   HGBA1C 14.9 (H) 05/03/2024   HGBA1C 14.5 (H) 02/07/2024   HGBA1C 14.8 (H) 01/28/2024       Latest Ref Rng & Units 07/09/2024   12:07 PM 06/27/2024   11:33 AM 06/22/2024    3:24 AM  BMP  Glucose 70 - 99 mg/dL 372  726  71   BUN 6 - 20 mg/dL 9  8  19    Creatinine 0.61 - 1.24 mg/dL 9.15  9.28  9.21   BUN/Creat Ratio 9 - 20  11    Sodium 135 - 145 mmol/L 129  139  127   Potassium 3.5 - 5.1 mmol/L 4.3  4.4  3.1   Chloride 98 - 111 mmol/L 91  98  93   CO2 22 - 32 mmol/L 26  19  24    Calcium  8.9 - 10.3 mg/dL 9.4  9.0  9.3     Lab Results  Component Value Date   CHOL 201 (H) 01/28/2024   HDL 75 01/28/2024   LDLCALC 109 (H) 01/28/2024   TRIG 85 01/28/2024   CHOLHDL 2.7 01/28/2024    Medications Reviewed Today   Medications were not reviewed in this encounter       Assessment/Plan:   Diabetes: - Currently uncontrolled with most recent A1C of 14.9% above goal <7%. Medication adherence appears inappropriate. His mother assists with medication management, but reports that she is not able to locate the Lantus  that was picked up on 06/25/24. She is agreeable to switching to Tresiba , which is also more appropriate treatment for patient given long duration of action, which may help prevent DKA if patient has a delay in administering his dose. Denies s/sx of hypoglycemia with recent increase in Novolog . - Last UACR July 2025: < 5 mg/g - Reviewed goal A1c, goal fasting, and goal 2  hour post prandial glucose - Reviewed hypoglycemia management plan and the rule of 15 - Reviewed dietary modifications including  utilizing the healthy plate method, limiting portion size of carbohydrate foods, increasing intake of protein and non-starchy vegetables. Counseled patient to stay hydrated with water  throughout the day. - Recommend to switch Lantus  to Tresiba  30 units daily for insurance access and longer duration of action. Collaborated with PCP to place orders. Patient's mother agreeable to [facility].  - Recommend to continue Novolog  9 units TID with meals. Patient's mother confirms that she has ~2 boxes of this remaining in the fridge. - Recommend to check glucose twice daily: fasting and 2-hr PPG . Counseled patient to bring glucometer or BG log to every appointment. Will collaborate  with CPhT to complete PA for Dexcom G7 sensors and reader - Next A1C due October 2025  [address]    Written patient instructions provided. Patient verbalized understanding of treatment plan.   Follow Up Plan:  Pharmacist telephone 07/26/24 PCP/CDCES clinic visit in 07/31/24   Lorain Baseman, PharmD [facility] [phone]

## 2024-07-12 NOTE — Telephone Encounter (Signed)
 Attempted to outreach patient/caregiver to follow-up on transition to Tresiba  and discuss restarting Dexcom G7 sensors + receiver. Unable to contact patient or his mother. PA for [facility] receiver has been completed and approved. Per test claim, sensor and receiver are covered for $0. Will send prescriptions to pharmacy and follow-up on access/DM over the next few weeks.   Lorain Baseman, PharmD [facility] [phone]

## 2024-07-13 NOTE — Telephone Encounter (Signed)
 Jacob [name] (Key: [id number]) PA Case ID #: [id number] Rx #: [id number] Need Help? Call us  at [phone] Outcome Approved on September 11 by [employer] 2017 Approved. DEXCOM G6 SENSOR Misc is approved from 07/12/2024 to 01/09/2025. Effective Date: 07/12/2024 Authorization Expiration Date: 01/09/2025 Drug Dexcom G6 Sensor ePA cloud logo Form [employer] Electronic Prior Authorization Form Original Claim Info [id number] .. . Help Desk [phone]. For 3 day temporary supply, use Level of Service 03.

## 2024-07-14 ENCOUNTER — Encounter (HOSPITAL_COMMUNITY): Payer: Self-pay | Admitting: Emergency Medicine

## 2024-07-14 ENCOUNTER — Ambulatory Visit (HOSPITAL_COMMUNITY)
Admission: EM | Admit: 2024-07-14 | Discharge: 2024-07-14 | Disposition: A | Discharge status: Home / Self Care | Payer: MEDICAID

## 2024-07-14 ENCOUNTER — Encounter (HOSPITAL_COMMUNITY): Payer: Self-pay

## 2024-07-14 ENCOUNTER — Emergency Department (EMERGENCY_DEPARTMENT_HOSPITAL)
Admission: EM | Admit: 2024-07-14 | Discharge: 2024-07-17 | Disposition: A | Discharge status: Home / Self Care | Payer: MEDICAID | Source: Home / Self Care | Attending: Emergency Medicine | Admitting: Emergency Medicine

## 2024-07-14 ENCOUNTER — Emergency Department (HOSPITAL_COMMUNITY)
Admission: EM | Admit: 2024-07-14 | Discharge: 2024-07-14 | Disposition: A | Discharge status: Home / Self Care | Payer: MEDICAID | Attending: Emergency Medicine | Admitting: Emergency Medicine

## 2024-07-14 ENCOUNTER — Other Ambulatory Visit: Payer: Self-pay

## 2024-07-14 DIAGNOSIS — Z794 Long term (current) use of insulin: Secondary | ICD-10-CM | POA: Insufficient documentation

## 2024-07-14 DIAGNOSIS — R45851 Suicidal ideations: Secondary | ICD-10-CM | POA: Insufficient documentation

## 2024-07-14 DIAGNOSIS — E1065 Type 1 diabetes mellitus with hyperglycemia: Secondary | ICD-10-CM | POA: Insufficient documentation

## 2024-07-14 DIAGNOSIS — E109 Type 1 diabetes mellitus without complications: Secondary | ICD-10-CM | POA: Insufficient documentation

## 2024-07-14 DIAGNOSIS — R739 Hyperglycemia, unspecified: Secondary | ICD-10-CM | POA: Diagnosis present

## 2024-07-14 DIAGNOSIS — E162 Hypoglycemia, unspecified: Secondary | ICD-10-CM

## 2024-07-14 DIAGNOSIS — F332 Major depressive disorder, recurrent severe without psychotic features: Secondary | ICD-10-CM | POA: Diagnosis present

## 2024-07-14 LAB — URINALYSIS, ROUTINE W REFLEX MICROSCOPIC
Bacteria, UA: NONE SEEN
Bilirubin Urine: NEGATIVE
Glucose, UA: >=500 mg/dL — AB
Hgb urine dipstick: NEGATIVE
Ketones, ur: NEGATIVE mg/dL
Leukocytes,Ua: NEGATIVE
Nitrite: NEGATIVE
Protein, ur: NEGATIVE mg/dL
Specific Gravity, Urine: 1.02 (ref 1.005–1.030)
pH: 7 (ref 5.0–8.0)

## 2024-07-14 LAB — CBC
HCT: 36.4 % — ABNORMAL LOW (ref 39.0–52.0)
HCT: 36.8 % — ABNORMAL LOW (ref 39.0–52.0)
Hemoglobin: 11.9 g/dL — ABNORMAL LOW (ref 13.0–17.0)
Hemoglobin: 12 g/dL — ABNORMAL LOW (ref 13.0–17.0)
MCH: 28.5 pg (ref 26.0–34.0)
MCH: 28.8 pg (ref 26.0–34.0)
MCHC: 32.3 g/dL (ref 30.0–36.0)
MCHC: 33 g/dL (ref 30.0–36.0)
MCV: 87.3 fL (ref 80.0–100.0)
MCV: 88 fL (ref 80.0–100.0)
Platelets: 391 K/uL (ref 150–400)
Platelets: 395 K/uL (ref 150–400)
RBC: 4.17 MIL/uL — ABNORMAL LOW (ref 4.22–5.81)
RBC: 4.18 MIL/uL — ABNORMAL LOW (ref 4.22–5.81)
RDW: 13.4 % (ref 11.5–15.5)
RDW: 13.7 % (ref 11.5–15.5)
WBC: 6.3 K/uL (ref 4.0–10.5)
WBC: 6.9 K/uL (ref 4.0–10.5)
nRBC: 0 % (ref 0.0–0.2)
nRBC: 0 % (ref 0.0–0.2)

## 2024-07-14 LAB — BASIC METABOLIC PANEL WITH GFR
Anion gap: 11 (ref 5–15)
BUN: 8 mg/dL (ref 6–20)
CO2: 25 mmol/L (ref 22–32)
Calcium: 8.9 mg/dL (ref 8.9–10.3)
Chloride: 103 mmol/L (ref 98–111)
Creatinine, Ser: 0.67 mg/dL (ref 0.61–1.24)
GFR, Estimated: >60 mL/min (ref >60)
Glucose, Bld: 53 mg/dL — ABNORMAL LOW (ref 70–99)
Potassium: 3.6 mmol/L (ref 3.5–5.1)
Sodium: 139 mmol/L (ref 135–145)

## 2024-07-14 LAB — I-STAT CHEM 8, ED
BUN: 13 mg/dL (ref 6–20)
Calcium, Ion: 1.17 mmol/L (ref 1.15–1.40)
Chloride: 94 mmol/L — ABNORMAL LOW (ref 98–111)
Creatinine, Ser: 0.9 mg/dL (ref 0.61–1.24)
Glucose, Bld: >700 mg/dL (ref 70–99)
HCT: 38 % — ABNORMAL LOW (ref 39.0–52.0)
Hemoglobin: 12.9 g/dL — ABNORMAL LOW (ref 13.0–17.0)
Potassium: 4.6 mmol/L (ref 3.5–5.1)
Sodium: 131 mmol/L — ABNORMAL LOW (ref 135–145)
TCO2: 22 mmol/L (ref 22–32)

## 2024-07-14 LAB — I-STAT VENOUS BLOOD GAS, ED
Acid-Base Excess: 1 mmol/L (ref 0.0–2.0)
Bicarbonate: 25.6 mmol/L (ref 20.0–28.0)
Calcium, Ion: 1.17 mmol/L (ref 1.15–1.40)
HCT: 47 % (ref 39.0–52.0)
Hemoglobin: 16 g/dL (ref 13.0–17.0)
O2 Saturation: 97 %
Patient temperature: 37
Potassium: 4.5 mmol/L (ref 3.5–5.1)
Sodium: 129 mmol/L — ABNORMAL LOW (ref 135–145)
TCO2: 27 mmol/L (ref 22–32)
pCO2, Ven: 41.9 mmHg — ABNORMAL LOW (ref 44–60)
pH, Ven: 7.394 (ref 7.25–7.43)
pO2, Ven: 88 mmHg — ABNORMAL HIGH (ref 32–45)

## 2024-07-14 LAB — COMPREHENSIVE METABOLIC PANEL WITH GFR
ALT: 22 U/L (ref 0–44)
ALT: 19 U/L (ref 0–44)
AST: 21 U/L (ref 15–41)
AST: 18 U/L (ref 15–41)
Albumin: 3.3 g/dL — ABNORMAL LOW (ref 3.5–5.0)
Albumin: 3.3 g/dL — ABNORMAL LOW (ref 3.5–5.0)
Alkaline Phosphatase: 105 U/L (ref 38–126)
Alkaline Phosphatase: 81 U/L (ref 38–126)
Anion gap: 9 (ref 5–15)
Anion gap: 10 (ref 5–15)
BUN: 11 mg/dL (ref 6–20)
BUN: 8 mg/dL (ref 6–20)
CO2: 23 mmol/L (ref 22–32)
CO2: 25 mmol/L (ref 22–32)
Calcium: 8.6 mg/dL — ABNORMAL LOW (ref 8.9–10.3)
Calcium: 8.4 mg/dL — ABNORMAL LOW (ref 8.9–10.3)
Chloride: 95 mmol/L — ABNORMAL LOW (ref 98–111)
Chloride: 96 mmol/L — ABNORMAL LOW (ref 98–111)
Creatinine, Ser: 0.91 mg/dL (ref 0.61–1.24)
Creatinine, Ser: 0.7 mg/dL (ref 0.61–1.24)
GFR, Estimated: >60 mL/min (ref >60)
GFR, Estimated: >60 mL/min (ref >60)
Glucose, Bld: 695 mg/dL (ref 70–99)
Glucose, Bld: 530 mg/dL (ref 70–99)
Potassium: 4.4 mmol/L (ref 3.5–5.1)
Potassium: 4.4 mmol/L (ref 3.5–5.1)
Sodium: 127 mmol/L — ABNORMAL LOW (ref 135–145)
Sodium: 131 mmol/L — ABNORMAL LOW (ref 135–145)
Total Bilirubin: 0.3 mg/dL (ref 0.0–1.2)
Total Bilirubin: 0.5 mg/dL (ref 0.0–1.2)
Total Protein: 6.5 g/dL (ref 6.5–8.1)
Total Protein: 6.1 g/dL — ABNORMAL LOW (ref 6.5–8.1)

## 2024-07-14 LAB — CBG MONITORING, ED
Glucose-Capillary: 550 mg/dL (ref 70–99)
Glucose-Capillary: 149 mg/dL — ABNORMAL HIGH (ref 70–99)
Glucose-Capillary: 47 mg/dL — ABNORMAL LOW (ref 70–99)
Glucose-Capillary: 187 mg/dL — ABNORMAL HIGH (ref 70–99)
Glucose-Capillary: 309 mg/dL — ABNORMAL HIGH (ref 70–99)
Glucose-Capillary: 297 mg/dL — ABNORMAL HIGH (ref 70–99)
Glucose-Capillary: 190 mg/dL — ABNORMAL HIGH (ref 70–99)
Glucose-Capillary: 148 mg/dL — ABNORMAL HIGH (ref 70–99)

## 2024-07-14 LAB — ETHANOL
Alcohol, Ethyl (B): <15 mg/dL (ref <15)
Alcohol, Ethyl (B): <15 mg/dL (ref <15)

## 2024-07-14 LAB — RAPID URINE DRUG SCREEN, HOSP PERFORMED
Amphetamines: NOT DETECTED
Amphetamines: NOT DETECTED
Barbiturates: NOT DETECTED
Barbiturates: NOT DETECTED
Benzodiazepines: NOT DETECTED
Benzodiazepines: NOT DETECTED
Cocaine: NOT DETECTED
Cocaine: NOT DETECTED
Opiates: NOT DETECTED
Opiates: NOT DETECTED
Tetrahydrocannabinol: NOT DETECTED
Tetrahydrocannabinol: NOT DETECTED

## 2024-07-14 MED ORDER — LACTATED RINGERS IV BOLUS
1000.0000 mL | Freq: Once | INTRAVENOUS | Status: AC
  Administered 2024-07-14: 1000 mL via INTRAVENOUS

## 2024-07-14 MED ORDER — DEXTROSE IN LACTATED RINGERS 5 % IV SOLN: INTRAVENOUS | Status: DC

## 2024-07-14 MED ORDER — LACTATED RINGERS IV BOLUS
20.0000 mL/kg | Freq: Once | INTRAVENOUS | Status: AC
  Administered 2024-07-14: 1188 mL via INTRAVENOUS

## 2024-07-14 MED ORDER — INSULIN ASPART 100 UNIT/ML IJ SOLN: 10.0000 [IU] | Freq: Once | INTRAMUSCULAR | Status: DC

## 2024-07-14 MED ORDER — INSULIN REGULAR(HUMAN) IN NACL 100-0.9 UT/100ML-% IV SOLN: INTRAVENOUS | Status: DC

## 2024-07-14 MED ORDER — DEXTROSE 50 % IV SOLN: 0.0000 mL | INTRAVENOUS | Status: DC | PRN

## 2024-07-14 MED ORDER — POTASSIUM CHLORIDE 10 MEQ/100ML IV SOLN: 10.0000 meq | INTRAVENOUS | Status: DC

## 2024-07-14 MED ORDER — INSULIN ASPART 100 UNIT/ML IJ SOLN
8.0000 [IU] | Freq: Once | INTRAMUSCULAR | Status: AC
  Administered 2024-07-14: 8 [IU] via SUBCUTANEOUS

## 2024-07-14 MED ORDER — ARIPIPRAZOLE 5 MG PO TABS
5.0000 mg | ORAL_TABLET | Freq: Every day | ORAL | Status: DC
  Administered 2024-07-14 – 2024-07-16 (×3): 5 mg via ORAL
  Filled 2024-07-14 (×3): qty 1

## 2024-07-14 MED ORDER — INSULIN ASPART 100 UNIT/ML IJ SOLN
9.0000 [IU] | Freq: Three times a day (TID) | INTRAMUSCULAR | Status: DC
  Administered 2024-07-14 – 2024-07-16 (×6): 9 [IU] via SUBCUTANEOUS

## 2024-07-14 MED ORDER — TRAZODONE HCL 50 MG PO TABS
50.0000 mg | ORAL_TABLET | Freq: Every evening | ORAL | Status: DC | PRN
  Administered 2024-07-16: 50 mg via ORAL
  Filled 2024-07-14 (×2): qty 1

## 2024-07-14 MED ORDER — LACTATED RINGERS IV SOLN: INTRAVENOUS | Status: DC

## 2024-07-14 MED ORDER — HYDROXYZINE HCL 25 MG PO TABS
25.0000 mg | ORAL_TABLET | Freq: Three times a day (TID) | ORAL | Status: DC | PRN
  Filled 2024-07-14: qty 1

## 2024-07-14 MED ORDER — INSULIN ASPART 100 UNIT/ML IJ SOLN
5.0000 [IU] | Freq: Once | INTRAMUSCULAR | Status: AC
  Administered 2024-07-14: 5 [IU] via SUBCUTANEOUS

## 2024-07-14 MED ORDER — INSULIN GLARGINE 100 UNIT/ML ~~LOC~~ SOLN
30.0000 [IU] | Freq: Every day | SUBCUTANEOUS | Status: DC
  Administered 2024-07-14 – 2024-07-17 (×4): 30 [IU] via SUBCUTANEOUS
  Filled 2024-07-14 (×5): qty 0.3

## 2024-07-14 NOTE — ED Provider Notes (Signed)
 Here for suicidal thoughts.  He has had plans to throw himself off a bridge to kill himself.  He was seen in the emergency room in the last 24 hours but it looks like a psychological assessment was not done at that time.  He repeats these thoughts to me and to other staff.  We will get security to walk him down to the emergency room so he can be seen there for his suicidal thoughts and intentions.   Vonna Sharlet POUR, MD 07/14/24 1021

## 2024-07-14 NOTE — ED Notes (Signed)
 Pt transported with sitter and security to purple zone in the ED.

## 2024-07-14 NOTE — Discharge Instructions (Signed)
 He is to go to the ER escorted by security for his suicidal thoughts.

## 2024-07-14 NOTE — ED Provider Notes (Signed)
 [place] [facility]GENC[facility] DEPARTMENT AT [facility] Provider Note   CSN: [id number] Arrival date & time: 07/14/24  0020     Patient presents with: Suicidal   Jacob [name] is a 33 y.o. male.   33 year old male with past medical history of type 1 diabetes presents with complaint of suicidal ideation with plan to jump off of a bridge.  Patient states that he does not have a permanent residence, stores his food and medications at his mother's house and has been staying at his brother's house.  He states that his brother fell behind on his bills and kicked him out tonight which prompted him to come to the emergency room with suicidal ideation.  He agrees that had he not been kicked out of his brother's house, he would likely be in bed asleep right now.  No other complaints or concerns       Prior to Admission medications   Medication Sig Start Date End Date Taking? Authorizing Provider  benztropine  (COGENTIN ) 0.5 MG tablet Take 1 tablet (0.5 mg total) by mouth 2 (two) times daily. For prevention of EPS Patient not taking: Reported on 05/04/2024 02/09/24   Collene Gouge I, NP  Blood Glucose Monitoring Suppl (BLOOD GLUCOSE MONITOR SYSTEM) w/Device KIT Use in the morning, at noon, and at bedtime. 05/28/24   Scott, Rocky SAILOR, [name]  Continuous Glucose Receiver (DEXCOM G7 RECEIVER) DEVI Use as directed to monitor blood sugar with Dexcom G7 sensors. 07/12/24   [name], DO  Continuous Glucose Sensor (DEXCOM G7 SENSOR) MISC Use as directed to monitor blood sugar with receiver. Change sensor every 10 days. 07/12/24   [name], DO  glucose blood (ACCU-CHEK GUIDE TEST) test strip Testing in the morning, at noon, and at bedtime. 06/27/24 10/25/24  [name], DO  haloperidol  decanoate (HALDOL  DECANOATE) 100 MG/ML injection Inject 1 mL (100 mg total) into the muscle every 30 (thirty) days. (Due on 03-09-24): For mood control 03/09/24   Collene Gouge I, NP  hydrOXYzine  (ATARAX ) 25 MG tablet Take 25 mg by  mouth 3 (three) times daily as needed for anxiety. Patient not taking: Reported on 05/04/2024    [provider]  ibuprofen  (ADVIL ) 200 MG tablet Take 400 mg by mouth 2 (two) times daily as needed for headache or moderate pain (pain score 4-6).    [provider]  insulin  aspart (NOVOLOG  FLEXPEN) 100 UNIT/ML FlexPen Inject 9 Units into the skin 3 (three) times daily before meals. For diabetes control 06/27/24   [name], DO  insulin  degludec (TRESIBA  FLEXTOUCH) 100 UNIT/ML FlexTouch Pen Inject 30 Units into the skin daily. May increase up to 35 units daily if instructed by your provider. 07/05/24   [name], DO  traZODone  (DESYREL ) 50 MG tablet Take 50 mg by mouth at bedtime. Patient not taking: Reported on 05/04/2024    [provider]    Allergies: Patient has no known allergies.    Review of Systems Negative except as per HPI Updated Vital Signs BP 108/73   Pulse 69   Temp 98.1 F (36.7 C) (Oral)   Resp 18   Ht 5' 9 (1.753 m)   Wt 59.4 kg   SpO2 100%   BMI 19.35 kg/m   Physical Exam Vitals and nursing note reviewed.  Constitutional:      General: He is not in acute distress.    Appearance: He is well-developed. He is not diaphoretic.  HENT:     Head: Normocephalic and atraumatic.  Cardiovascular:     Rate and Rhythm: Normal rate and regular rhythm.     Heart sounds: Normal heart sounds.  Pulmonary:     Effort: Pulmonary effort is normal.     Breath sounds: Normal breath sounds.  Abdominal:     Palpations: Abdomen is soft.     Tenderness: There is no abdominal tenderness.  Skin:    General: Skin is warm and dry.     Findings: No erythema or rash.  Neurological:     Mental Status: He is alert and oriented to person, place, and time.  Psychiatric:        Behavior: Behavior normal.     (all labs ordered are listed, but only abnormal results are displayed) Labs Reviewed  COMPREHENSIVE METABOLIC PANEL WITH GFR - Abnormal; Notable for the  following components:      Result Value   Sodium 127 (*)    Chloride 95 (*)    Glucose, Bld 695 (*)    Calcium  8.6 (*)    Albumin 3.3 (*)    All other components within normal limits  CBC - Abnormal; Notable for the following components:   RBC 4.18 (*)    Hemoglobin 11.9 (*)    HCT 36.8 (*)    All other components within normal limits  BASIC METABOLIC PANEL WITH GFR - Abnormal; Notable for the following components:   Glucose, Bld 53 (*)    All other components within normal limits  I-STAT CHEM 8, ED - Abnormal; Notable for the following components:   Sodium 131 (*)    Chloride 94 (*)    Glucose, Bld >700 (*)    Hemoglobin 12.9 (*)    HCT 38.0 (*)    All other components within normal limits  I-STAT VENOUS BLOOD GAS, ED - Abnormal; Notable for the following components:   pCO2, Ven 41.9 (*)    pO2, Ven 88 (*)    Sodium 129 (*)    All other components within normal limits  CBG MONITORING, ED - Abnormal; Notable for the following components:   Glucose-Capillary 550 (*)    All other components within normal limits  CBG MONITORING, ED - Abnormal; Notable for the following components:   Glucose-Capillary 149 (*)    All other components within normal limits  CBG MONITORING, ED - Abnormal; Notable for the following components:   Glucose-Capillary 47 (*)    All other components within normal limits  CBG MONITORING, ED - Abnormal; Notable for the following components:   Glucose-Capillary 187 (*)    All other components within normal limits  CBG MONITORING, ED - Abnormal; Notable for the following components:   Glucose-Capillary 309 (*)    All other components within normal limits  ETHANOL  RAPID URINE DRUG SCREEN, HOSP PERFORMED  URINALYSIS, ROUTINE W REFLEX MICROSCOPIC    EKG: None  Radiology: No results found.   .Critical Care  Performed by: Beverley Leita LABOR, [name] Authorized by: Beverley Leita LABOR, [name]   Critical care provider statement:    Critical care time  (minutes):  30   Critical care was time spent personally by me on the following activities:  Development of treatment plan with patient or surrogate, discussions with consultants, evaluation of patient's response to treatment, examination of patient, ordering and review of laboratory studies, ordering and review of radiographic studies, ordering and performing treatments and interventions, pulse oximetry, re-evaluation of patient's condition and review of old charts    Medications Ordered in the ED  lactated ringers  bolus 1,188 mL (0 mLs Intravenous Stopped 07/14/24 0425)  insulin  aspart (novoLOG ) injection 8 Units (8 Units Subcutaneous Given 07/14/24 0202)                                    Medical Decision Making Amount and/or Complexity of Data Reviewed Labs: ordered.  Risk Prescription drug management.   This patient presents to the ED for concern of suicidal ideation in the setting of not having a place to sleep tonight, this involves an extensive number of treatment options, and is a complaint that carries with it a high risk of complications and morbidity.  The differential diagnosis includes situational depression, malingering   Co morbidities / Chronic conditions that complicate the patient evaluation  T1DM, schizoaffective disorder, bipolar disorder    Additional history obtained:  Additional history obtained from EMR External records from outside source obtained and reviewed including prior records on file   Lab Tests:  I Ordered, and personally interpreted labs.  The pertinent results include: UDS negative.  I-STAT VBG with normal pH.  CMP with elevated glucose at 695, sodium at 127 secondary to elevated glucose, not in DKA.  CBC without significant findings.  Repeat metabolic panel after insulin  and fluids, glucose now 53.  Repeat CBG after p.o. food and juice, now 187.   Cardiac Monitoring: / EKG:  The patient was maintained on a cardiac monitor.  I personally  viewed and interpreted the cardiac monitored which showed an underlying rhythm of: Normal sinus rhythm, rate 75   Problem List / ED Course / Critical interventions / Medication management  33 year old male presents to the ER with complaint of feeling suicidal after he was kicked out of his brother's house tonight.  Admits that if he had not been kicked out of the house, he would be at home in bed at this time.  He was found to be significantly hyperglycemic, not in DKA, mentating normally.  He was provided with IV fluids and insulin .  On recheck, he was hypoglycemic, mentating normally.  He was provided with oral fluids and food and glucose subsequently improved.  Plan is for discharge in the morning to follow-up with behavioral health as needed. I ordered medication including insulin , fluids Reevaluation of the patient after these medicines showed that the patient was hypoglycemic, mentating normally, provided with juice and sandwich, glucose improved. I have reviewed the patients home medicines and have made adjustments as needed    Social Determinants of Health:  Lives between his brother's house and his mom's house.   Test / Admission - Considered:  Stable for discharge      Final diagnoses:  Hyperglycemia  Hypoglycemia    ED Discharge Orders     None          Beverley Leita LABOR, [name] 07/14/24 0603    Franklyn Sid SAILOR, MD 07/14/24 [phone]

## 2024-07-14 NOTE — ED Notes (Signed)
 Patient is being discharged from the Urgent Care and sent to the Emergency Department via security escort to ED. Per Dr Vonna, patient is in need of higher level of care due to suicidal ideations. Patient is aware and verbalizes understanding of plan of care.  Vitals:   07/14/24 1013  BP: (!) 129/102  Pulse: 74  Resp: 15  Temp: 98.3 F (36.8 C)  SpO2: 100%

## 2024-07-14 NOTE — Discharge Instructions (Addendum)
 Continue to monitor your blood sugar and manage with your regular medications. Follow up with [facility] for mental health needs.
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
## 2024-07-14 NOTE — ED Notes (Signed)
[name] took all belongings.  

## 2024-07-14 NOTE — ED Notes (Signed)
 Unable to provide urine, sample cup in hand

## 2024-07-14 NOTE — ED Notes (Signed)
 Leita Chancy, PA informed of the patient's blood sugar >700 and she stated she will be coming up to triage to see him.

## 2024-07-14 NOTE — ED Triage Notes (Signed)
 Pt reports that he has suicidal ideations of jumping off a bridge. Denies any actions on trying to harm himself in past month.
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
## 2024-07-14 NOTE — ED Triage Notes (Signed)
 Pt arrive POV with mom c/o SI since yesterday with a plan of jumping of a bridge.

## 2024-07-14 NOTE — ED Notes (Signed)
 Provider made aware of pts CBG of 47. Gave pt a sandwich and grape juice.

## 2024-07-14 NOTE — ED Notes (Signed)
 Called for Security to come to escort patient to ED to be seen for his Suicidal ideations.

## 2024-07-14 NOTE — ED Notes (Signed)
 Patient noted very diaphoretic, alert and oriented x4, Blood glucose checked, patient stated he felt fine.

## 2024-07-14 NOTE — ED Triage Notes (Signed)
 Pt came in via POV d/t SI for the past moth, states he wants to jumping off a bridge. Endorses he hears voices that say kill your self & denies visual hallucinations. A/Ox4, no acute distress noted at this time.

## 2024-07-15 LAB — CBG MONITORING, ED
Glucose-Capillary: 67 mg/dL — ABNORMAL LOW (ref 70–99)
Glucose-Capillary: 344 mg/dL — ABNORMAL HIGH (ref 70–99)
Glucose-Capillary: 380 mg/dL — ABNORMAL HIGH (ref 70–99)
Glucose-Capillary: 386 mg/dL — ABNORMAL HIGH (ref 70–99)
Glucose-Capillary: 374 mg/dL — ABNORMAL HIGH (ref 70–99)
Glucose-Capillary: 131 mg/dL — ABNORMAL HIGH (ref 70–99)

## 2024-07-15 NOTE — Progress Notes (Signed)
 CSW sent additional BH referral information to [facility] for review. CSW will continue to monitor the patient to secure recommended disposition.    Tayveon Lombardo, MSW, LCSW-A  2:17 PM 07/15/2024
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
## 2024-07-15 NOTE — ED Notes (Signed)
 Pt was advised that he would need to cut back on snacks due to high CBG. Pt verbalized understanding.

## 2024-07-15 NOTE — ED Provider Notes (Signed)
 Emergency Medicine Observation Re-evaluation Note  Jacob [name] is a 33 y.o. male, seen on rounds today.  Pt initially presented to the ED for complaints of SI Currently, the patient is resting.  Physical Exam  BP 116/75 (BP Location: Right Arm)   Pulse 63   Temp 97.6 F (36.4 C) (Oral)   Resp 16   SpO2 96%  Physical Exam General: nad Lungs: no resp distress Psych: calm  ED Course / MDM  EKG:   I have reviewed the labs performed to date as well as medications administered while in observation.  Recent changes in the last 24 hours include BH recommending placement.  Plan  Current plan is for placement.    Elnor Savant A, DO 07/15/24 1121

## 2024-07-15 NOTE — Progress Notes (Signed)
 Patient has been denied by [facility] due to no appropriate beds available. Patient meets BH inpatient criteria per Elveria Batter, NP. Patient has been faxed out to the following facilities:  [facility]  [address] [place] [place] [address] [phone]  [facility]  [address] [place] [address] [phone]  [facility]  [address], [place] [address] [phone]  [facility]  [address] [place], [place] [address] [phone]  [facility] Patient Placement  [facility], [place] [phone]  [facility]  [address] [place] [address] [phone]  [facility]  [address] [place] [address] [phone]  [facility]  [address] [place], [place] [phone]  [facility]  [address] [place] [address] [phone]  [facility]  [address] [place] [address] [phone]  [facility]  [address] [place], [place] [address] [phone]  [facility]  [address] [place] [address] [phone]  [facility]  [address] [place] [address] [phone]  [facility]  [address] [place] [address] [phone]  [facility]  [address], [place] [address] [phone]  [facility]  [address], [place] [address] [phone]  [facility]  [address], [place] [address]  [phone]    Bunnie Gallop, MSW, LCSW-A  10:33 AM 07/15/2024

## 2024-07-15 NOTE — ED Notes (Signed)
 Patient took a shower independently, while staff changed his sheets and cleaned his bed

## 2024-07-16 ENCOUNTER — Other Ambulatory Visit: Payer: Self-pay

## 2024-07-16 DIAGNOSIS — R45851 Suicidal ideations: Secondary | ICD-10-CM

## 2024-07-16 DIAGNOSIS — F332 Major depressive disorder, recurrent severe without psychotic features: Secondary | ICD-10-CM

## 2024-07-16 LAB — CBG MONITORING, ED
Glucose-Capillary: 285 mg/dL — ABNORMAL HIGH (ref 70–99)
Glucose-Capillary: 343 mg/dL — ABNORMAL HIGH (ref 70–99)
Glucose-Capillary: 112 mg/dL — ABNORMAL HIGH (ref 70–99)
Glucose-Capillary: 342 mg/dL — ABNORMAL HIGH (ref 70–99)

## 2024-07-16 MED ORDER — INSULIN ASPART 100 UNIT/ML IJ SOLN: 0.0000 [IU] | Freq: Every day | INTRAMUSCULAR | Status: DC

## 2024-07-16 MED ORDER — INSULIN ASPART 100 UNIT/ML IJ SOLN: 0.0000 [IU] | Freq: Three times a day (TID) | INTRAMUSCULAR | Status: DC

## 2024-07-16 MED ORDER — INSULIN ASPART 100 UNIT/ML IJ SOLN
0.0000 [IU] | Freq: Three times a day (TID) | INTRAMUSCULAR | Status: DC
  Administered 2024-07-17: 9 [IU] via SUBCUTANEOUS
  Administered 2024-07-17: 3 [IU] via SUBCUTANEOUS

## 2024-07-16 MED ORDER — INSULIN ASPART 100 UNIT/ML IJ SOLN
0.0000 [IU] | Freq: Every day | INTRAMUSCULAR | Status: DC
  Administered 2024-07-16: 5 [IU] via SUBCUTANEOUS

## 2024-07-16 MED ORDER — INSULIN ASPART 100 UNIT/ML IJ SOLN
9.0000 [IU] | Freq: Three times a day (TID) | INTRAMUSCULAR | Status: DC
  Administered 2024-07-17: 5 [IU] via SUBCUTANEOUS
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
## 2024-07-16 NOTE — ED Notes (Signed)
 Sitter brought from another unit to sit on Pt

## 2024-07-16 NOTE — Progress Notes (Signed)
 LCSW Progress Note  [id number]   Jacob [name]  07/16/2024  2:20 PM  Description:   Inpatient Psychiatric Referral  Patient was recommended inpatient per Elveria Batter (NP). There are no available beds at [facility], per [facility] Carlo RN. Patient was referred to the following out of network facilities:   Destination  Service Provider Address Phone Fax  [facility]  [address] [place] [place] [address] [phone]  [facility]  [address] [place] [address] [phone]  [facility]  [address] [place] [address] [phone]  [facility]  [address] [place], [place] [address] [phone]  [facility] Patient Pl[facility]  [facility], [place] [phone]  [facility]  [address] [place] [address] [phone]  [facility]  [address] [place] [address] [phone]  [facility] EFAX  [address] [place], [place] [phone]  [facility]  [address] [place] [address] [phone]  [facility]  [address] [place] [address] [phone]  [facility]  [address] [place], [place] [address] [phone]  [facility]  [address] [place] [address] [phone]  [facility]  [address] [place] [address] [phone]  [facility]  [address] [place] [address] [phone]  [facility]  [address] [place] [address] [phone]   [facility]  [address] [place] [address] [phone]  [facility]  [address] [place] [address] [phone]      Situation ongoing, CSW to continue following and update chart as more information becomes available.      [place] Ibrahem Volkman MSW, LCSW  07/16/2024 2:20 PM
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
## 2024-07-16 NOTE — ED Provider Notes (Signed)
 Emergency Medicine Observation Re-evaluation Note  Jacob [name] is a 33 y.o. male, seen on rounds today.  Pt initially presented to the ED for complaints of SI Currently, the patient is eating breakfast. No acute events overnight  Physical Exam  BP 120/83 (BP Location: Right Arm)   Pulse 62   Temp 97.9 F (36.6 C) (Oral)   Resp 17   SpO2 100%  Physical Exam General: Awake, alert, NAD Lungs: No respiratory distress Psych: Calm, cooperative   ED Course / MDM  EKG:   I have reviewed the labs performed to date as well as medications administered while in observation.  Recent changes in the last 24 hours include none.  Plan  Current plan is for waiting for placement.     Gennaro Duwaine CROME, DO 07/16/24 [phone]

## 2024-07-16 NOTE — ED Notes (Signed)
 Pt laughing by himself in his room

## 2024-07-16 NOTE — Progress Notes (Addendum)
 Denver Bentson  MRN: [id number]   Inpatient Psychiatric Referral   Patient was recommended inpatient per Elveria Batter (NP). There are no available beds at [facility], per [facility] Carlo RN. Patient was re-referred to the following out of network facilities.  Destination  Service Provider Request Status Services Address Phone Fax Patient Preferred  [facility]  Pending - Request Sent -- [address] [place] [place] [address] [phone] --  [facility]  Pending - Request Sent -- [address] [place] [address] [phone] --  [facility]  Pending - Request Sent -- [address], [place] [address] [phone] --  [facility]  Pending - Request Sent -- [address] [place], [place] [address] [phone] --  [facility]  Pending - Request Sent -- [facility], [place] [phone] --  [facility]  Pending - Request Sent -- [address], [place] [address] [phone] --  [facility]  Pending - Request Sent -- [address] [place] [address] [phone] --  [facility]  Pending - Request Sent -- [address] [place], [place] [phone] --  [facility]  Pending - Request Sent -- [address] [place] [address] [phone] --  [facility]  Pending - Request Sent -- [address] [place] [address] [phone] --  [facility]  Pending - Request Sent -- [address] [place], [place] [address] [phone] --  [facility]  Pending - Request Sent -- [address] [place] [address] [phone]  [phone] --  [facility]  Pending - Request Sent -- [address], [place] [address] [phone] --  [facility]  Pending - Request Sent -- [address] [place] [address] [phone] --  [facility]  Pending - Request Sent -- [address], [place] [address] [phone] --  [facility]  Pending - Request Sent -- [address] [place] [address] [phone] --  [facility]  Pending - Request Sent -- [address], [place] [address] [phone] --

## 2024-07-16 NOTE — ED Notes (Signed)
 Patient was given a pack of graham crackers and peanut butter.

## 2024-07-17 LAB — CBG MONITORING, ED
Glucose-Capillary: 202 mg/dL — ABNORMAL HIGH (ref 70–99)
Glucose-Capillary: 357 mg/dL — ABNORMAL HIGH (ref 70–99)
Glucose-Capillary: 245 mg/dL — ABNORMAL HIGH (ref 70–99)
Glucose-Capillary: 209 mg/dL — ABNORMAL HIGH (ref 70–99)

## 2024-07-17 MED ORDER — INSULIN ASPART 100 UNIT/ML IJ SOLN
0.0000 [IU] | Freq: Three times a day (TID) | INTRAMUSCULAR | Status: DC
  Administered 2024-07-17: 2 [IU] via SUBCUTANEOUS

## 2024-07-17 MED ORDER — INSULIN ASPART 100 UNIT/ML IJ SOLN: 0.0000 [IU] | Freq: Every day | INTRAMUSCULAR | Status: DC

## 2024-07-17 NOTE — Discharge Instructions (Signed)
 Return to the ER for any new or worsening symptoms.  Be sure to follow-up with your primary psychiatric provider.

## 2024-07-17 NOTE — ED Provider Notes (Signed)
 Emergency Medicine Observation Re-evaluation Note  Jacob [name] is a 33 y.o. male, seen on rounds today.  Pt initially presented to the ED for complaints of SI Currently, the patient is sleeping.  Physical Exam  BP 120/82   Pulse 72   Temp 98.2 F (36.8 C) (Oral)   Resp 14   SpO2 98%  Physical Exam General: No acute distress Cardiac: Normal rate Lungs: No increased work of breathing Psych: Calm  ED Course / MDM  EKG:   I have reviewed the labs performed to date as well as medications administered while in observation.  Recent changes in the last 24 hours include none.  Plan  Current plan is for inpatient psychiatric treatment    Lenor Hollering, MD 07/17/24 [phone]

## 2024-07-17 NOTE — Progress Notes (Signed)
 LCSW Progress Note  [id number]   Jacob [name]  07/17/2024  9:42 AM  Description:   Inpatient Psychiatric Referral  Patient was recommended inpatient per Elveria Batter (NP). There are [facility], per [facility] Carlo RN). Patient was referred to the following out of network facilities:   Destination  Service Provider Address Phone Fax  [facility]  [address] [place] [place] [address] [phone]  [facility]  [address] [place] [address] [phone]  [facility]  [address] [place] [address] [phone]  [facility]  [address] [place], [place] [address] [phone]  [facility] Patient Placement  [facility], [place] [phone]  [facility]  [address], [place] [address] [phone]  [facility]  [address] [place] [address] [phone]  [facility] EFAX  [address] [place], [place] [phone]  [facility]  [address] [place] [address] [phone]  [facility]  [address] [place] [address] [phone]  [facility]  [address] [place], [place] [address] [phone]  [facility]  [address] [place] [address] [phone]  [facility]  [address] [place] [address] [phone]  [facility]  [address] [place] [address] [phone]  [facility]  [address] [place] [address] [phone]   [facility]  [address] [place] [address] [phone]  [facility]  [address], [place] [address] [phone]      Situation ongoing, CSW to continue following and update chart as more information becomes available.      Jacob [name], MSW, LCSW  07/17/2024 9:42 AM

## 2024-07-17 NOTE — ED Provider Notes (Signed)
 Patient is requesting discharge.  Psychiatry is re-evaluated and he is no longer suicidal, he also endorses he is feeling well enough to discharge to me as well.  Psychiatry has cleared him for discharge.  He denies SI at this time.  Will discharge per his request and he is with family.   Freddi Hamilton, MD 07/17/24 [phone]

## 2024-07-17 NOTE — Progress Notes (Signed)
 Pt has been accepted to [facility] on 07/17/2024  Bed assignment: MEADOWS UNIT  Pt meets inpatient criteria per: Elveria Batter NP  Attending Physician will be: Dr. Sherrill MD  Report can be called to: [phone]  Pt can arrive after ASAP  Care Team Notified: Elveria Batter NP, [facility] RN    [place] Tanara Turvey LCSW-A   07/17/2024 11:54 AM

## 2024-07-17 NOTE — Inpatient Diabetes Management (Signed)
 Inpatient Diabetes Program Recommendations  AACE/ADA: New Consensus Statement on Inpatient Glycemic Control (2015)  Target Ranges:  Prepandial:   less than 140 mg/dL      Peak postprandial:   less than 180 mg/dL (1-2 hours)      Critically ill patients:  140 - 180 mg/dL   Lab Results  Component Value Date   GLUCAP 245 (H) 07/17/2024   HGBA1C 14.9 (H) 05/03/2024   Diabetes history: type 1 Outpatient Diabetes medications: Tresiba  30 units daily, Novolog  9 units TID Current orders for Inpatient glycemic control: Lantus  30 units daily, Novolog  9 units TID   Inpatient Diabetes Program Recommendations:      Recommend changing correction to Novolog  0-6 units TID and discontinuation of Novolog  9 units TID (duplicate order).  Of note, under duplicate order administration instructions included to give Novolog  5 units if consumes <50% of meal. This is not safe practice for a type 1 who is sensitive to insulin . Risk for hypoglycemia is high. Thus, explaining dose administration this AM.  Secure chat sent to rN to verify and instruct on new orders. SSI insulin  held this afternoon.   Thanks, Tinnie Minus, MSN, RNC-OB Diabetes Coordinator [phone] (8a-5p)
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
## 2024-07-25 ENCOUNTER — Other Ambulatory Visit: Payer: Self-pay

## 2024-07-25 ENCOUNTER — Encounter (HOSPITAL_COMMUNITY): Payer: Self-pay

## 2024-07-25 ENCOUNTER — Other Ambulatory Visit (HOSPITAL_COMMUNITY): Payer: Self-pay

## 2024-07-25 ENCOUNTER — Observation Stay (HOSPITAL_COMMUNITY)
Admission: EM | Admit: 2024-07-25 | Discharge: 2024-07-25 | Disposition: A | Discharge status: Home / Self Care | Payer: MEDICAID | Attending: Internal Medicine | Admitting: Internal Medicine

## 2024-07-25 DIAGNOSIS — F1990 Other psychoactive substance use, unspecified, uncomplicated: Secondary | ICD-10-CM | POA: Diagnosis not present

## 2024-07-25 DIAGNOSIS — Z5900 Homelessness unspecified: Secondary | ICD-10-CM

## 2024-07-25 DIAGNOSIS — F323 Major depressive disorder, single episode, severe with psychotic features: Secondary | ICD-10-CM | POA: Diagnosis not present

## 2024-07-25 DIAGNOSIS — F411 Generalized anxiety disorder: Secondary | ICD-10-CM | POA: Diagnosis not present

## 2024-07-25 DIAGNOSIS — F250 Schizoaffective disorder, bipolar type: Secondary | ICD-10-CM

## 2024-07-25 DIAGNOSIS — E876 Hypokalemia: Secondary | ICD-10-CM | POA: Diagnosis not present

## 2024-07-25 DIAGNOSIS — E871 Hypo-osmolality and hyponatremia: Secondary | ICD-10-CM | POA: Diagnosis present

## 2024-07-25 DIAGNOSIS — F1999 Other psychoactive substance use, unspecified with unspecified psychoactive substance-induced disorder: Secondary | ICD-10-CM | POA: Diagnosis not present

## 2024-07-25 DIAGNOSIS — E873 Alkalosis: Secondary | ICD-10-CM | POA: Diagnosis not present

## 2024-07-25 DIAGNOSIS — F17210 Nicotine dependence, cigarettes, uncomplicated: Secondary | ICD-10-CM | POA: Diagnosis not present

## 2024-07-25 DIAGNOSIS — F333 Major depressive disorder, recurrent, severe with psychotic symptoms: Secondary | ICD-10-CM | POA: Diagnosis present

## 2024-07-25 DIAGNOSIS — Z794 Long term (current) use of insulin: Secondary | ICD-10-CM | POA: Diagnosis not present

## 2024-07-25 DIAGNOSIS — E1065 Type 1 diabetes mellitus with hyperglycemia: Principal | ICD-10-CM | POA: Diagnosis present

## 2024-07-25 DIAGNOSIS — E1011 Type 1 diabetes mellitus with ketoacidosis with coma: Secondary | ICD-10-CM

## 2024-07-25 DIAGNOSIS — E1010 Type 1 diabetes mellitus with ketoacidosis without coma: Secondary | ICD-10-CM | POA: Diagnosis not present

## 2024-07-25 LAB — CBC WITH DIFFERENTIAL/PLATELET
Abs Immature Granulocytes: 0.02 K/uL (ref 0.00–0.07)
Basophils Absolute: 0.1 K/uL (ref 0.0–0.1)
Basophils Relative: 1 %
Eosinophils Absolute: 0.1 K/uL (ref 0.0–0.5)
Eosinophils Relative: 1 %
HCT: 41.5 % (ref 39.0–52.0)
Hemoglobin: 14.1 g/dL (ref 13.0–17.0)
Immature Granulocytes: 0 %
Lymphocytes Relative: 40 %
Lymphs Abs: 3 K/uL (ref 0.7–4.0)
MCH: 28.8 pg (ref 26.0–34.0)
MCHC: 34 g/dL (ref 30.0–36.0)
MCV: 84.7 fL (ref 80.0–100.0)
Monocytes Absolute: 0.4 K/uL (ref 0.1–1.0)
Monocytes Relative: 6 %
Neutro Abs: 3.9 K/uL (ref 1.7–7.7)
Neutrophils Relative %: 52 %
Platelets: 410 K/uL — ABNORMAL HIGH (ref 150–400)
RBC: 4.9 MIL/uL (ref 4.22–5.81)
RDW: 13.2 % (ref 11.5–15.5)
WBC: 7.5 K/uL (ref 4.0–10.5)
nRBC: 0 % (ref 0.0–0.2)

## 2024-07-25 LAB — COMPREHENSIVE METABOLIC PANEL WITH GFR
ALT: 20 U/L (ref 0–44)
AST: 27 U/L (ref 15–41)
Albumin: 4.3 g/dL (ref 3.5–5.0)
Alkaline Phosphatase: 122 U/L (ref 38–126)
Anion gap: 15 (ref 5–15)
BUN: 16 mg/dL (ref 6–20)
CO2: 23 mmol/L (ref 22–32)
Calcium: 9.4 mg/dL (ref 8.9–10.3)
Chloride: 88 mmol/L — ABNORMAL LOW (ref 98–111)
Creatinine, Ser: 1.2 mg/dL (ref 0.61–1.24)
GFR, Estimated: >60 mL/min (ref >60)
Glucose, Bld: 667 mg/dL (ref 70–99)
Potassium: 4.4 mmol/L (ref 3.5–5.1)
Sodium: 126 mmol/L — ABNORMAL LOW (ref 135–145)
Total Bilirubin: 1.5 mg/dL — ABNORMAL HIGH (ref 0.0–1.2)
Total Protein: 7.3 g/dL (ref 6.5–8.1)

## 2024-07-25 LAB — I-STAT VENOUS BLOOD GAS, ED
Acid-Base Excess: 6 mmol/L — ABNORMAL HIGH (ref 0.0–2.0)
Bicarbonate: 29.5 mmol/L — ABNORMAL HIGH (ref 20.0–28.0)
Calcium, Ion: 1.03 mmol/L — ABNORMAL LOW (ref 1.15–1.40)
HCT: 44 % (ref 39.0–52.0)
Hemoglobin: 15 g/dL (ref 13.0–17.0)
O2 Saturation: 75 %
Potassium: 4.2 mmol/L (ref 3.5–5.1)
Sodium: 126 mmol/L — ABNORMAL LOW (ref 135–145)
TCO2: 31 mmol/L (ref 22–32)
pCO2, Ven: 38.7 mmHg — ABNORMAL LOW (ref 44–60)
pH, Ven: 7.49 — ABNORMAL HIGH (ref 7.25–7.43)
pO2, Ven: 37 mmHg (ref 32–45)

## 2024-07-25 LAB — BASIC METABOLIC PANEL WITH GFR
Anion gap: 11 (ref 5–15)
Anion gap: 10 (ref 5–15)
BUN: 12 mg/dL (ref 6–20)
BUN: 10 mg/dL (ref 6–20)
CO2: 28 mmol/L (ref 22–32)
CO2: 29 mmol/L (ref 22–32)
Calcium: 9.2 mg/dL (ref 8.9–10.3)
Calcium: 8.7 mg/dL — ABNORMAL LOW (ref 8.9–10.3)
Chloride: 98 mmol/L (ref 98–111)
Chloride: 100 mmol/L (ref 98–111)
Creatinine, Ser: 0.88 mg/dL (ref 0.61–1.24)
Creatinine, Ser: 0.82 mg/dL (ref 0.61–1.24)
GFR, Estimated: >60 mL/min (ref >60)
GFR, Estimated: >60 mL/min (ref >60)
Glucose, Bld: 120 mg/dL — ABNORMAL HIGH (ref 70–99)
Glucose, Bld: 78 mg/dL (ref 70–99)
Potassium: 3.2 mmol/L — ABNORMAL LOW (ref 3.5–5.1)
Potassium: 3.3 mmol/L — ABNORMAL LOW (ref 3.5–5.1)
Sodium: 137 mmol/L (ref 135–145)
Sodium: 139 mmol/L (ref 135–145)

## 2024-07-25 LAB — CBC
HCT: 36.2 % — ABNORMAL LOW (ref 39.0–52.0)
Hemoglobin: 12.5 g/dL — ABNORMAL LOW (ref 13.0–17.0)
MCH: 28.6 pg (ref 26.0–34.0)
MCHC: 34.5 g/dL (ref 30.0–36.0)
MCV: 82.8 fL (ref 80.0–100.0)
Platelets: 322 K/uL (ref 150–400)
RBC: 4.37 MIL/uL (ref 4.22–5.81)
RDW: 13.1 % (ref 11.5–15.5)
WBC: 7.2 K/uL (ref 4.0–10.5)
nRBC: 0 % (ref 0.0–0.2)

## 2024-07-25 LAB — URINALYSIS, ROUTINE W REFLEX MICROSCOPIC
Bacteria, UA: NONE SEEN
Bilirubin Urine: NEGATIVE
Glucose, UA: >=500 mg/dL — AB
Hgb urine dipstick: NEGATIVE
Ketones, ur: 20 mg/dL — AB
Leukocytes,Ua: NEGATIVE
Nitrite: NEGATIVE
Protein, ur: NEGATIVE mg/dL
Specific Gravity, Urine: 1.032 — ABNORMAL HIGH (ref 1.005–1.030)
pH: 6 (ref 5.0–8.0)

## 2024-07-25 LAB — CBG MONITORING, ED
Glucose-Capillary: 111 mg/dL — ABNORMAL HIGH (ref 70–99)
Glucose-Capillary: 122 mg/dL — ABNORMAL HIGH (ref 70–99)
Glucose-Capillary: 139 mg/dL — ABNORMAL HIGH (ref 70–99)
Glucose-Capillary: 169 mg/dL — ABNORMAL HIGH (ref 70–99)
Glucose-Capillary: 197 mg/dL — ABNORMAL HIGH (ref 70–99)
Glucose-Capillary: 225 mg/dL — ABNORMAL HIGH (ref 70–99)
Glucose-Capillary: 343 mg/dL — ABNORMAL HIGH (ref 70–99)
Glucose-Capillary: 48 mg/dL — ABNORMAL LOW (ref 70–99)
Glucose-Capillary: 94 mg/dL (ref 70–99)
Glucose-Capillary: 96 mg/dL (ref 70–99)
Glucose-Capillary: >600 mg/dL (ref 70–99)

## 2024-07-25 LAB — BETA-HYDROXYBUTYRIC ACID: Beta-Hydroxybutyric Acid: 1.56 mmol/L — ABNORMAL HIGH (ref 0.05–0.27)

## 2024-07-25 LAB — MAGNESIUM: Magnesium: 2.2 mg/dL (ref 1.7–2.4)

## 2024-07-25 MED ORDER — LACTATED RINGERS IV BOLUS
20.0000 mL/kg | Freq: Once | INTRAVENOUS | Status: AC
  Administered 2024-07-25: 1188 mL via INTRAVENOUS

## 2024-07-25 MED ORDER — INSULIN PEN NEEDLE 32G X 4 MM MISC
1.0000 | Freq: Three times a day (TID) | 0 refills | Status: DC
  Filled 2024-07-25: qty 100, 33d supply, fill #0
  Filled 2024-09-27: qty 100, fill #0
  Filled 2024-10-01: qty 100, 33d supply, fill #0

## 2024-07-25 MED ORDER — POTASSIUM CHLORIDE CRYS ER 20 MEQ PO TBCR
40.0000 meq | EXTENDED_RELEASE_TABLET | Freq: Once | ORAL | Status: AC
  Administered 2024-07-25: 40 meq via ORAL
  Filled 2024-07-25: qty 2

## 2024-07-25 MED ORDER — POTASSIUM CHLORIDE 10 MEQ/100ML IV SOLN
10.0000 meq | INTRAVENOUS | Status: AC
  Administered 2024-07-25 (×2): 10 meq via INTRAVENOUS
  Filled 2024-07-25 (×2): qty 100

## 2024-07-25 MED ORDER — LACTATED RINGERS IV SOLN: INTRAVENOUS | Status: DC

## 2024-07-25 MED ORDER — NOVOLOG FLEXPEN 100 UNIT/ML ~~LOC~~ SOPN
9.0000 [IU] | PEN_INJECTOR | Freq: Three times a day (TID) | SUBCUTANEOUS | 0 refills | Status: DC
  Filled 2024-07-25: qty 15, 56d supply, fill #0

## 2024-07-25 MED ORDER — INSULIN ASPART 100 UNIT/ML IJ SOLN: 3.0000 [IU] | Freq: Three times a day (TID) | INTRAMUSCULAR | Status: DC

## 2024-07-25 MED ORDER — INSULIN GLARGINE-YFGN 100 UNIT/ML ~~LOC~~ SOLN
15.0000 [IU] | SUBCUTANEOUS | Status: DC
  Filled 2024-07-25: qty 0.15

## 2024-07-25 MED ORDER — ACETAMINOPHEN 325 MG PO TABS: 650.0000 mg | ORAL_TABLET | Freq: Four times a day (QID) | ORAL | Status: DC | PRN

## 2024-07-25 MED ORDER — DEXTROSE 50 % IV SOLN
0.0000 mL | INTRAVENOUS | Status: DC | PRN
  Administered 2024-07-25: 50 mL via INTRAVENOUS
  Filled 2024-07-25: qty 50

## 2024-07-25 MED ORDER — RIVAROXABAN 10 MG PO TABS
10.0000 mg | ORAL_TABLET | Freq: Every day | ORAL | Status: DC
  Administered 2024-07-25: 10 mg via ORAL
  Filled 2024-07-25: qty 1

## 2024-07-25 MED ORDER — DEXTROSE IN LACTATED RINGERS 5 % IV SOLN: INTRAVENOUS | Status: AC

## 2024-07-25 MED ORDER — INSULIN ASPART 100 UNIT/ML IJ SOLN: 0.0000 [IU] | Freq: Three times a day (TID) | INTRAMUSCULAR | Status: DC

## 2024-07-25 MED ORDER — TRESIBA FLEXTOUCH 100 UNIT/ML ~~LOC~~ SOPN
30.0000 [IU] | PEN_INJECTOR | Freq: Every day | SUBCUTANEOUS | 0 refills | Status: DC
  Filled 2024-07-25: qty 9, 30d supply, fill #0

## 2024-07-25 MED ORDER — INSULIN REGULAR(HUMAN) IN NACL 100-0.9 UT/100ML-% IV SOLN
INTRAVENOUS | Status: DC
Start: 2024-07-25 — End: 2024-07-25
  Administered 2024-07-25: 8.5 [IU]/h via INTRAVENOUS
  Filled 2024-07-25: qty 100

## 2024-07-25 MED ORDER — INSULIN GLARGINE 100 UNIT/ML ~~LOC~~ SOLN
15.0000 [IU] | Freq: Every day | SUBCUTANEOUS | Status: DC
  Administered 2024-07-25: 15 [IU] via SUBCUTANEOUS
  Filled 2024-07-25 (×2): qty 0.15

## 2024-07-25 NOTE — H&P (Signed)
 Date: 07/25/2024               Patient Name:  Jacob [name] MRN: [id number]  DOB: 06-02-1991 Age / Sex: 33 y.o., male   PCP: Napoleon Limes, MD         Medical Service: Internal Medicine Teaching Service         Attending Physician: Dr. MICAEL Riis Winfrey      First Contact: Doyal Miyamoto, MD    Second Contact: Dr. Damien Lease, DO         Pager Information: First Contact Pager: [phone]   Second Contact Pager: [phone]   SUBJECTIVE   Chief Complaint: High blood sugars    History of Present Illness: Jacob [name] is a 33 y.o. male with a history of uncontrolled DM Type I on insulin  pump, GAD, Schizoaffective disorder-bipolar type, MDD with psychosis and unhoused status who presents to [facility] ED with complaints of high blood sugars after his insulin  pump stopped working this morning.   Patient is alert and oriented during interview. He states that this morning his insulin  pump stopped working and it got jammed. He notes that this morning he was feeling nauseous, but has not had any episodes of emesis today. He has been eating a sandwich and steak and potatoes today, and reports that he has been drinking a lot of water  throughout the day.   He did admit to using methamphetamine 2-3 days ago, cocaine yesterday, and smoked marijuana and cigarettes today. He also reports that he drank half a can of [employer] Hard Lemonade this afternoon around 3-4pm.    He normally takes long acting insulin  30-35 units daily, and short acting insulin  9 units with large meals.  He has been trying to go to the [facility] and would like to get his life together and off the streets, but states this has been difficult. He denies SI/HI. Haldol  injections performed by ACT team.   ROS: Denies headaches, dizziness, fever, chills, runny nose, sore throat, vision changes, hearing changes, chest pain, shortness of breath, difficulty breathing, vomiting, abdominal pain. Denies increased urinary frequency,  pain with urination, constipation or diarrhea. No recent falls. Endorses nausea.    ED Course: Labs: Glucose 667, Na 126, pH 7.490, Beta-hydroxybuterate 1.56, and UA with > 500 glucose and 20 ketones   Imaging: None Received: EndoTool, D5LR, insulin , LR, KCl Consulted: IMTS   Meds:  Patient reported:  Insulin  long-acting 30-35 units daily Insulin  short-acting 9 units TID with large meals   No outpatient medications have been marked as taking for the 07/25/24 encounter [facility] Encounter).    Past Medical History uncontrolled DM Type I on insulin  pump, GAD, Schizoaffective disorder-bipolar type, MDD with psychosis and unhoused status  Past Surgical History History reviewed. No pertinent surgical history.   Social:  Lives With: Unhoused  Occupation: Not working  Support: Mother Level of Function: Independent  PCP:  Napoleon Limes, MD  Substances: -Tobacco: Yes, cigarettes  -Alcohol: Yes, last drink was today around 3-4pm  -Recreational Drug: Methamphetamine, cocaine, marijuana   Family History:  Family History  Problem Relation Age of Onset   Healthy Mother    Healthy Father      Allergies: Allergies as of 07/25/2024   (No Known Allergies)    Review of Systems: A complete ROS was negative except as per HPI.   OBJECTIVE:   Physical Exam: Blood pressure 137/89, pulse 71, temperature 98.4 F (36.9 C), resp. rate 12, height 5' 9 (1.753 m), weight 59.4  kg, SpO2 100%.   Constitutional: tired-appearing male lying in exam chair HEENT: normocephalic atraumatic, mucous membranes dry Eyes: conjunctiva non-erythematous Cardiovascular: regular rate and rhythm, bilateral radial pulses 2+, bilateral dorsal pedal pulses 2+, brisk capillary refill bilateral feet and hands  Pulmonary/Chest: normal work of breathing on room air, lungs clear to auscultation bilaterally Abdominal: soft, non-tender, non-distended MSK: normal bulk and tone. Neurological: alert & oriented x  3 Skin: warm and dry, no ulcers or wounds on bilateral feet, two plantar warts on right foot  Psych: mood calm, behavior normal, thought content normal, judgement normal    Labs: CBC    Component Value Date/Time   WBC 7.5 07/25/2024 0047   RBC 4.90 07/25/2024 0047   HGB 15.0 07/25/2024 0102   HCT 44.0 07/25/2024 0102   PLT 410 (H) 07/25/2024 0047   MCV 84.7 07/25/2024 0047   MCH 28.8 07/25/2024 0047   MCHC 34.0 07/25/2024 0047   RDW 13.2 07/25/2024 0047   LYMPHSABS 3.0 07/25/2024 0047   MONOABS 0.4 07/25/2024 0047   EOSABS 0.1 07/25/2024 0047   BASOSABS 0.1 07/25/2024 0047     CMP     Component Value Date/Time   NA 126 (L) 07/25/2024 0102   NA 139 06/27/2024 1133   K 4.2 07/25/2024 0102   CL 88 (L) 07/25/2024 0047   CO2 23 07/25/2024 0047   GLUCOSE 667 (HH) 07/25/2024 0047   BUN 16 07/25/2024 0047   BUN 8 06/27/2024 1133   CREATININE 1.20 07/25/2024 0047   CALCIUM  9.4 07/25/2024 0047   PROT 7.3 07/25/2024 0047   PROT 5.8 (L) 06/27/2024 1133   ALBUMIN 4.3 07/25/2024 0047   ALBUMIN 3.7 (L) 06/27/2024 1133   AST 27 07/25/2024 0047   ALT 20 07/25/2024 0047   ALKPHOS 122 07/25/2024 0047   BILITOT 1.5 (H) 07/25/2024 0047   BILITOT <0.2 06/27/2024 1133   GFRNONAA >60 07/25/2024 0047   GFRAA >60 07/28/2020 2100    Imaging:  No results found.   EKG: personally reviewed my interpretation is NSR with evidence of LVH and prolonged QTc of 510 ms. Prior EKG with right atrial enlargement.   ASSESSMENT & PLAN:   Assessment & Plan by Problem: Principal Problem:   Hyperglycemia due to type 1 diabetes mellitus (HCC) Active Problems:   Schizoaffective disorder, bipolar type (HCC)   Uncontrolled type 1 diabetes mellitus with hyperglycemia, with long-term current use of insulin  (HCC)   Homeless   Hyponatremia   Jacob [name] is a 33 y.o. person living with a history of uncontrolled DM Type I on insulin  pump, GAD, Schizoaffective disorder-bipolar type, MDD with psychosis  and unhoused status who presents to [facility] ED with complaints of high blood sugars after his insulin  pump jammed and admitted for hyperglycemia with early signs of DKA on hospital day 0  ##Hyperglycemia #Uncontrolled Type I Diabetes Mellitus Patient with a history of uncontrolled diabetes who presented with elevated blood sugars after insulin  pump jammed this morning. BG of 667, BHB of 1.56, UA with > 500 glucose and 20 ketones. pH 7.49. Anion gap 15 on admission. A1C from 05/03/24 was 14.9. Hyperglycemic episode likely in the setting of insulin  pump jamming this morning. Unlikely to be due to infectious cause given negative ROS and WBC wnl. EKG without evidence of ischemic changes. He has been using methamphetamines, cocaine, marijuana, tobacco and alcohol in the past 2 days which could be contributing to this current hyperglycemic state, but he has been unable to take any insulin  today  which is more likely the root cause. He presented in the early stages of DKA. He reports that his home insulin  regimen is 30-35 units long acting insulin  daily, and short acting insulin  9 units three times daily with large meals, which he has reported adherence to. He was started on EndoTool and will continue this until repeat AG closed x2.  - s/p LR bolus - Continue on Endotool, D5LR @ 125 mL/hr, LR @ 125 mL/hr, KCl - Continue NPO while on EndoTool - CBG q1 hr - BMP q4 hours, once AG is <12 x2, recommend transition to home dose insulin  as long as patient is eating - BHB q8 hrs  - Tylenol  650 mg q6 hrs prn pn  - discontinue IVF once patient is off insulin  drip and appropriately eating and drinking  - He may require new insulin  pump and this should be coordinated prior to discharge - If he is sent home with insulin  pump, it may be helpful to provide extra insulin  pens/vials in case of future similar pump malfunction   #Hyponatremia Na of 126 on admission. Likely secondary to hypovolemic hyponatremia in the setting of  hyperglycemia. Continue EndoTool and IVF as above. If persistent after correction of hyperglycemia, would consider obtaining urine sodium and urine Osm studies.  - f/u BMP   #Substance Use Disorder  Admits to using methamphetamine in the past 2-3 days, cocaine yesterday, marijuana and tobacco today, and drank half a can of Mike[employer] Hard Lemonade today around 3pm. He has been actively seeking resources through the [facility].   #Schizoaffective disorder, bipolar type #MDD with psychosis #GAD Patient follows with ACT Team for Haldol  injections. He denied any active SI or HI this admission.   Best practice: Diet: NPO while on EndoTool VTE: Xarelto  10 mg IVF: EndoTool, D5LR, LR, insulin , KCl Code: Full  Disposition planning: Prior to Admission Living Arrangement: Unhoused Anticipated Discharge Location: Back to prior living situation  Dispo: Admit patient to Observation with expected length of stay less than 2 midnights.  Signed: Doyal Miyamoto, MD Internal Medicine Resident  07/25/2024, 4:00 AM  On Call pager: [phone]

## 2024-07-25 NOTE — ED Notes (Signed)
 Patient's mother on the phone with new group home, RN provided instructions on insulin  pen from the written instructions on the insulin  pen that were clearly marked for this patient. Facility provider concerned about patient CBG level being within range on arrival, mother and facility have a plan in place for patient arrival.

## 2024-07-25 NOTE — ED Notes (Signed)
 Requested pharm to send insulin  dose Called [facility]

## 2024-07-25 NOTE — Discharge Summary (Signed)
 Name: Jacob [name] MRN: [id number] DOB: 1991-10-18 33 y.o. PCP: Napoleon Limes, MD  Date of Admission: 07/25/2024 12:23 AM Date of Discharge: 07/25/2024 Attending Physician: Dr. MICAEL Riis Winfrey  Discharge Diagnosis: 1. Principal Problem:   Hyperglycemia due to type 1 diabetes mellitus (HCC) Active Problems:   MDD (major depressive disorder), recurrent, severe, with psychosis (HCC)   Schizoaffective disorder, bipolar type (HCC)   Generalized anxiety disorder   Uncontrolled type 1 diabetes mellitus with hyperglycemia, with long-term current use of insulin  (HCC)   Homeless   Hyponatremia   Substance use disorder   Discharge Medications: Allergies as of 07/25/2024   No Known Allergies      Medication List     TAKE these medications    Accu-Chek Guide Test test strip Generic drug: glucose blood Testing in the morning, at noon, and at bedtime.   benztropine  0.5 MG tablet Commonly known as: COGENTIN  Take 1 tablet (0.5 mg total) by mouth 2 (two) times daily. For prevention of EPS   Blood Glucose Monitor System w/Device Kit Use in the morning, at noon, and at bedtime.   haloperidol  decanoate 100 MG/ML injection Commonly known as: HALDOL  DECANOATE Inject 1 mL (100 mg total) into the muscle every 30 (thirty) days. (Due on 03-09-24): For mood control   hydrOXYzine  25 MG tablet Commonly known as: ATARAX  Take 25 mg by mouth 3 (three) times daily as needed for anxiety.   ibuprofen  200 MG tablet Commonly known as: ADVIL  Take 400 mg by mouth 2 (two) times daily as needed for headache or moderate pain (pain score 4-6).   Insulin  Pen Needle 32G X 4 MM Misc Inject 1 each into the skin 3 (three) times daily.   NovoLOG  FlexPen 100 UNIT/ML FlexPen Generic drug: insulin  aspart Inject 9 Units into the skin 3 (three) times daily before meals. For diabetes control   traZODone  50 MG tablet Commonly known as: DESYREL  Take 50 mg by mouth at bedtime.   Tresiba  FlexTouch 100  UNIT/ML FlexTouch Pen Generic drug: insulin  degludec Inject 30 Units into the skin daily. May increase up to 35 units daily if instructed by your provider.         Disposition and follow-up:   Mr.Augustus Ollinger was discharged from [name] in Good condition.  At the hospital follow up visit please address:  1.  Uncontrolled Type 1 DM - ensure adherence to medications (insulin  glargine 30-35 Units, and lispro 9 U TID.   Un-housed, Substance use, Schizoaffective disorder, bipolar type, GAD, MDD with psychosis - mother is trying to get him into long term mental health group home through [facility]. Social worker consulted while admitted. F/u on status of this arrangement   2.  Labs / imaging needed at time of follow-up: BMP   3.  Pending labs/ test needing follow-up: n/a  Follow-up Appointments: IMTS hospital f/u   Hospital Course by problem list: [name] is a 33 y.o. person living with a history of Uncontrolled Type 1 DM, GAD, schizoaffective disorder - bipolar type, MDD with psychosis, housing instability who presented with high BG and nausea and admitted for hyperglycemia now being discharged on hospital day 0 with the following pertinent hospital course:  Uncontrolled type 1 diabetes Hyperglycemia Presented with elevated blood sugars after insulin  pump jammed.  BG 667, beta hydroxybutyrate 1.56. UA >500 glucose, 20 ketones.  pH 7.49.  Anion gap 15 on admission.  Last A1c 05/03/2024 14.9.  Patient has recurrent hospitalizations for DKA.  Stated to admission team that Home regimen is 30 to 35 units long-acting insulin  daily and short acting insulin  9 units 3 times daily with large meals.  This was unlikely an infectious cause as he presented with negative ROS and WBC wnl. EKG was without evidence of ischemic change. Previously he had issues with adherence due to unhoused status, but had been able to use his mother's home to store his insulin .  He was  started on Endo tool (start time 0239; stopped at 0406 in setting of hypoglycemia; restarted 0658). He has one episode of asymptomatic hypoglycemia treated with D5. AG closed x2 and he was transitioned off of Endotool. Social work and diabetic counselor consulted to help with new insulin  pump and housing status.    Hypokalemia repleted    Metabolic Alkalosis pH 7.49, pCO2 38.7, pO2 37, HCO3 29.5, AG 15. Reported no episodes of emesis prior to admission.  Denied diarrhea prior to admission. Chloride and CO2 on BMP WNL after endotool.    Pseudo-hyponatremia Presented with Na 126. Corrected Na 135-140. last BMP Na 137.  At 0515 patient was ANO x 4.  Likely secondary to hypovolemia in setting of hyperglycemia. Resolved after glucose correction.   Chronic Conditions:  Schizoaffective disorder, bipolar type 2. MDD with psychosis 3. GAD Follows ACT team for Haldol  injections.  Recently in ED for SI due to being kicked out of his brother's house.  Denied active SI or HI during admission.  Discharged on 07/17/2024 from ED after psychiatric reevaluation and stating he was no longer suicidal. I called mother and she stated she is trying to get him placed in long term mental health group home through [facility]. She asks if social work can call her to help her arrange aid. Social work consult placed stat to call mother and help arrange LT mental health group home care.  4. Substance use disorder Admitted to taking methamphetamine the past 2 to 3 days, cocaine 9/23, marijuana and tobacco today, and had mikes hard lemonade around 3 PM 9/23. Patient stated it is very difficult for him to make better choices being without housing. He was counseled on lifestyle changes. Mom is trying to get him to Long Term Mental Health Group home. Social work onboard.   Subjective He denied current N/V, abd pain, diarrhea, SOB, CP. He is currently without housing. His mother and grandma put him up in a hotel for 5  days, but now he is again without housing. Mother is trying to help place him in long-term mental health group home. Per Patient's request, he would like for me to update mother.   Discharge Exam:   BP (!) 124/53   Pulse 75   Temp 98.7 F (37.1 C)   Resp 13   Ht 5' 9 (1.753 m)   Wt 59.4 kg   SpO2 98%   BMI 19.35 kg/m  Discharge exam:  Physical Exam Constitutional:      General: He is not in acute distress.    Appearance: He is not ill-appearing or toxic-appearing.  Cardiovascular:     Rate and Rhythm: Normal rate and regular rhythm.     Heart sounds: Normal heart sounds. No murmur heard.    No friction rub. No gallop.  Pulmonary:     Effort: Pulmonary effort is normal.     Breath sounds: Normal breath sounds. No stridor. No wheezing, rhonchi or rales.  Abdominal:     General: Abdomen is flat. Bowel sounds are normal.  Palpations: Abdomen is soft.     Tenderness: There is no abdominal tenderness. There is no guarding.  Musculoskeletal:     Right lower leg: No edema.     Left lower leg: No edema.  Skin:    General: Skin is warm and dry.  Neurological:     Mental Status: He is alert.      Pertinent Labs, Studies, and Procedures:     Latest Ref Rng & Units 07/25/2024    5:15 AM 07/25/2024    1:02 AM 07/25/2024   12:47 AM  CBC  WBC 4.0 - 10.5 K/uL 7.2   7.5   Hemoglobin 13.0 - 17.0 g/dL 87.4  84.9  85.8   Hematocrit 39.0 - 52.0 % 36.2  44.0  41.5   Platelets 150 - 400 K/uL 322   410        Latest Ref Rng & Units 07/25/2024    9:26 AM 07/25/2024    4:28 AM 07/25/2024    1:02 AM  CMP  Glucose 70 - 99 mg/dL 78  879    BUN 6 - 20 mg/dL 10  12    Creatinine 9.38 - 1.24 mg/dL 9.17  9.11    Sodium 864 - 145 mmol/L 139  137  126   Potassium 3.5 - 5.1 mmol/L 3.3  3.2  4.2   Chloride 98 - 111 mmol/L 100  98    CO2 22 - 32 mmol/L 29  28    Calcium  8.9 - 10.3 mg/dL 8.7  9.2      No results found.   Discharge Instructions: Discharge Instructions     Call MD for:   difficulty breathing, headache or visual disturbances   Complete by: As directed    Call MD for:  extreme fatigue   Complete by: As directed    Call MD for:  hives   Complete by: As directed    Call MD for:  persistant dizziness or light-headedness   Complete by: As directed    Call MD for:  persistant nausea and vomiting   Complete by: As directed    Call MD for:  redness, tenderness, or signs of infection (pain, swelling, redness, odor or green/yellow discharge around incision site)   Complete by: As directed    Call MD for:  severe uncontrolled pain   Complete by: As directed    Call MD for:  temperature >100.4   Complete by: As directed    Diet - low sodium heart healthy   Complete by: As directed    Discharge instructions   Complete by: As directed    Thank you for allowing us  to be part of your care. You were hospitalized for DKA. We treated you with insulin , fluids, and close monitoring  See the changes in your medications and management of your chronic conditions below:  *For your Type 2 diabetes  Make sure you take your insulin  medications as you reported they were prescribed to you  1. Insulin  Glargine: 30-35 Units Daily  2. Insulin  Lispro 9 units three times daily before large meals 3. Please check your blood sugars daily in the morning before eating. If your blood sugars are consistently high (>200), please contact [facility] clinic.   The social worker will be following up with you and your mom regarding help with housing/placement.   FOLLOW UP APPOINTMENTS: We arranged for you to follow up with your Internal Medicine doctor September 30th at 10:45 with Dr. Elicia   Please call  your PCP or our clinic if you have any questions or concerns, we may be able to help and keep you from a long and expensive emergency room wait. Our clinic and after hours phone number is [phone]. The best time to call is Monday through Friday 9 am to 4 pm but there is always someone available 24/7  if you have an emergency. If you need medication refills please notify your pharmacy one week in advance and they will send us  a request.   We are glad you are feeling better,  Sallyanne Primas Internal Medicine Inpatient Teaching Service at [facility]   Increase activity slowly   Complete by: As directed        Signed: Primas Sallyanne, DO 07/25/2024, 3:36 PM

## 2024-07-25 NOTE — ED Provider Notes (Signed)
 Jacob [name] EMERGENCY DEPARTMENT AT [facility] Provider Note   CSN: [id number] Arrival date & time: 07/25/24  0017     Patient presents with: Hyperglycemia   Jacob [name] is a 33 y.o. male.   Patient presents to the emergency department for evaluation of elevated blood sugars.  Patient reports that his insulin  pump stopped functioning this morning and he was unable to get any supplies.  Blood sugars have been elevated throughout the day.  He did have some nausea and vomiting earlier today but that has stopped.  Denies any abdominal pain.       Prior to Admission medications   Medication Sig Start Date End Date Taking? Authorizing Provider  benztropine  (COGENTIN ) 0.5 MG tablet Take 1 tablet (0.5 mg total) by mouth 2 (two) times daily. For prevention of EPS Patient not taking: Reported on 05/04/2024 02/09/24   Collene Gouge I, NP  Blood Glucose Monitoring Suppl (BLOOD GLUCOSE MONITOR SYSTEM) w/Device KIT Use in the morning, at noon, and at bedtime. 05/28/24   Scott, Rocky SAILOR, [name]  Continuous Glucose Receiver (DEXCOM G7 RECEIVER) DEVI Use as directed to monitor blood sugar with Dexcom G7 sensors. Patient not taking: Reported on 07/14/2024 07/12/24   [name], DO  Continuous Glucose Sensor (DEXCOM G7 SENSOR) MISC Use as directed to monitor blood sugar with receiver. Change sensor every 10 days. Patient not taking: Reported on 07/14/2024 07/12/24   [name], DO  glucose blood (ACCU-CHEK GUIDE TEST) test strip Testing in the morning, at noon, and at bedtime. 06/27/24 10/25/24  [name], DO  haloperidol  decanoate (HALDOL  DECANOATE) 100 MG/ML injection Inject 1 mL (100 mg total) into the muscle every 30 (thirty) days. (Due on 03-09-24): For mood control 03/09/24   Collene Gouge I, NP  hydrOXYzine  (ATARAX ) 25 MG tablet Take 25 mg by mouth 3 (three) times daily as needed for anxiety.    [provider]  ibuprofen  (ADVIL ) 200 MG tablet Take 400 mg by mouth 2 (two) times daily as  needed for headache or moderate pain (pain score 4-6).    [provider]  insulin  aspart (NOVOLOG  FLEXPEN) 100 UNIT/ML FlexPen Inject 9 Units into the skin 3 (three) times daily before meals. For diabetes control 06/27/24   [name], DO  insulin  degludec (TRESIBA  FLEXTOUCH) 100 UNIT/ML FlexTouch Pen Inject 30 Units into the skin daily. May increase up to 35 units daily if instructed by your provider. 07/05/24   [name], DO  traZODone  (DESYREL ) 50 MG tablet Take 50 mg by mouth at bedtime.    [provider]    Allergies: Patient has no known allergies.    Review of Systems  Updated Vital Signs BP 137/89 (BP Location: Right Arm)   Pulse 71   Temp 98.4 F (36.9 C)   Resp 12   Ht 5' 9 (1.753 m)   Wt 59.4 kg   SpO2 100%   BMI 19.35 kg/m   Physical Exam Vitals and nursing note reviewed.  Constitutional:      General: He is not in acute distress.    Appearance: He is well-developed.  HENT:     Head: Normocephalic and atraumatic.     Mouth/Throat:     Mouth: Mucous membranes are moist.  Eyes:     General: Vision grossly intact. Gaze aligned appropriately.     Extraocular Movements: Extraocular movements intact.     Conjunctiva/sclera: Conjunctivae normal.  Cardiovascular:     Rate and Rhythm: Normal rate and regular  rhythm.     Pulses: Normal pulses.     Heart sounds: Normal heart sounds, S1 normal and S2 normal. No murmur heard.    No friction rub. No gallop.  Pulmonary:     Effort: Pulmonary effort is normal. No respiratory distress.     Breath sounds: Normal breath sounds.  Abdominal:     Palpations: Abdomen is soft.     Tenderness: There is no abdominal tenderness. There is no guarding or rebound.     Hernia: No hernia is present.  Musculoskeletal:        General: No swelling.     Cervical back: Full passive range of motion without pain, normal range of motion and neck supple. No pain with movement, spinous process tenderness or muscular  tenderness. Normal range of motion.     Right lower leg: No edema.     Left lower leg: No edema.  Skin:    General: Skin is warm and dry.     Capillary Refill: Capillary refill takes less than 2 seconds.     Findings: No ecchymosis, erythema, lesion or wound.  Neurological:     Mental Status: He is alert and oriented to person, place, and time.     GCS: GCS eye subscore is 4. GCS verbal subscore is 5. GCS motor subscore is 6.     Cranial Nerves: Cranial nerves 2-12 are intact.     Sensory: Sensation is intact.     Motor: Motor function is intact. No weakness or abnormal muscle tone.     Coordination: Coordination is intact.  Psychiatric:        Mood and Affect: Mood normal.        Speech: Speech normal.        Behavior: Behavior normal.     (all labs ordered are listed, but only abnormal results are displayed) Labs Reviewed  CBC WITH DIFFERENTIAL/PLATELET - Abnormal; Notable for the following components:      Result Value   Platelets 410 (*)    All other components within normal limits  COMPREHENSIVE METABOLIC PANEL WITH GFR - Abnormal; Notable for the following components:   Sodium 126 (*)    Chloride 88 (*)    Glucose, Bld 667 (*)    Total Bilirubin 1.5 (*)    All other components within normal limits  BETA-HYDROXYBUTYRIC ACID - Abnormal; Notable for the following components:   Beta-Hydroxybutyric Acid 1.56 (*)    All other components within normal limits  URINALYSIS, ROUTINE W REFLEX MICROSCOPIC - Abnormal; Notable for the following components:   Color, Urine STRAW (*)    Specific Gravity, Urine 1.032 (*)    Glucose, UA >=500 (*)    Ketones, ur 20 (*)    All other components within normal limits  CBG MONITORING, ED - Abnormal; Notable for the following components:   Glucose-Capillary >600 (*)    All other components within normal limits  I-STAT VENOUS BLOOD GAS, ED - Abnormal; Notable for the following components:   pH, Ven 7.490 (*)    pCO2, Ven 38.7 (*)     Bicarbonate 29.5 (*)    Acid-Base Excess 6.0 (*)    Sodium 126 (*)    Calcium , Ion 1.03 (*)    All other components within normal limits  MAGNESIUM   CBG MONITORING, ED    EKG: EKG Interpretation Date/Time:  Wednesday July 25 2024 02:45:18 EDT Ventricular Rate:  75 PR Interval:  152 QRS Duration:  87 QT Interval:  456 QTC  Calculation: 510 R Axis:   96  Text Interpretation: Sinus rhythm Borderline right axis deviation LVH by voltage Anterior Q waves, possibly due to LVH Prolonged QT interval Confirmed by Haze Lonni PARAS [phone]) on 07/25/2024 2:46:31 AM  Radiology: No results found.   Procedures   Medications Ordered in the ED  insulin  regular, human (MYXREDLIN ) 100 units/ 100 mL infusion (8.5 Units/hr Intravenous New Bag/Given 07/25/24 0239)  lactated ringers  infusion (has no administration in time range)  dextrose  5 % in lactated ringers  infusion (has no administration in time range)  dextrose  50 % solution 0-50 mL (has no administration in time range)  potassium chloride  10 mEq in 100 mL IVPB (10 mEq Intravenous New Bag/Given 07/25/24 0240)  lactated ringers  bolus 1,188 mL (1,188 mLs Intravenous New Bag/Given 07/25/24 0233)                                    Medical Decision Making Risk Prescription drug management.   Differential diagnosis considered includes, but not limited to: DKA; HHNK; hyperglycemia  Patient presents to emergency department with complaints of elevated blood sugar secondary to insulin  pump malfunction.  Patient with glucose greater than 600.  He does have elevated beta hydroxybutyric acid but is not acidotic.  He does have a history of significant DKA in the past.  Will be initiated on IV fluid bolus, insulin  drip and admitted for management of mild DKA.  CRITICAL CARE Performed by: Lonni PARAS Haze   Total critical care time: 30 minutes  Critical care time was exclusive of separately billable procedures and treating other  patients.  Critical care was necessary to treat or prevent imminent or life-threatening deterioration.  Critical care was time spent personally by me on the following activities: development of treatment plan with patient and/or surrogate as well as nursing, discussions with consultants, evaluation of patient's response to treatment, examination of patient, obtaining history from patient or surrogate, ordering and performing treatments and interventions, ordering and review of laboratory studies, ordering and review of radiographic studies, pulse oximetry and re-evaluation of patient's condition.      Final diagnoses:  Diabetic ketoacidosis without coma associated with type 1 diabetes mellitus [facility])    ED Discharge Orders     None          Haze Lonni PARAS, MD 07/25/24 [phone]

## 2024-07-25 NOTE — ED Notes (Signed)
Dr Blinda Leatherwood at beside.

## 2024-07-25 NOTE — ED Notes (Signed)
 Pt states he is homeless and insulin  pen isn't working properly.   Pt trying to get sugar under control and resources.

## 2024-07-25 NOTE — Discharge Instructions (Signed)
 Thank you for allowing us  to be part of your care. You were hospitalized for DKA. We treated you with insulin , fluids, and close monitoring  See the changes in your medications and management of your chronic conditions below:  *For your Type 2 diabetes  Make sure you take your insulin  medications as you reported they were prescribed to you  Insulin  Glargine: 30-35 Units Daily  Insulin  Lispro 9 units three times daily before large meals Please check your blood sugars daily in the morning before eating. If your blood sugars are consistently high (>200), please contact [facility] clinic.   The social worker will be following up with you and your mom regarding help with housing/placement.   FOLLOW UP APPOINTMENTS: We arranged for you to follow up with your Internal Medicine doctor September 30th at 10:45 with Dr. Elicia   Please call your PCP or our clinic if you have any questions or concerns, we may be able to help and keep you from a long and expensive emergency room wait. Our clinic and after hours phone number is [phone]. The best time to call is Monday through Friday 9 am to 4 pm but there is always someone available 24/7 if you have an emergency. If you need medication refills please notify your pharmacy one week in advance and they will send us  a request.   We are glad you are feeling better,  Sallyanne Primas Internal Medicine Inpatient Teaching Service at [facility]

## 2024-07-25 NOTE — ED Notes (Signed)
 RN completed a CBG check that resulted as 48. Pt is asymptomatic. Aox4. RN administered PRN Dextrose  50% and notified NS to page attending.   RN will recheck pt CBG in 30 minutes.

## 2024-07-25 NOTE — Progress Notes (Addendum)
 CSW received call from IM informing pt will discharge. CSW noted consult for pt experiencing homelessness and per provider, Mom is requesting to speak with SW to discuss long term options. CSW did attach shelter and substance abuse resources to AVS.   CSW has outreached to [facility], [place], to inquire about pt being assigned to Care coordination and request contact info.   CSW spoke with pt's mother who states she spoke with [employer] this morning and they are sending her a form that the pt will need to sign. CSW informed [employer] CC will reach out to her by phone and encouraged her to inquire about outpatient resources/supports.No further ICM needs.

## 2024-07-25 NOTE — ED Notes (Signed)
Pt verbalizes understanding of DC instructions. Pt belongings returned and is ambulatory out of ED.  

## 2024-07-25 NOTE — ED Notes (Signed)
 Assumed care of pt at this time, Lantus  not yet admin, requested from pharmacy, insulin  drip DC prior to tranfers to room 8

## 2024-07-25 NOTE — ED Notes (Signed)
 Pt eating meal tray independently

## 2024-07-25 NOTE — Hospital Course (Addendum)
 Uncontrolled type 1 diabetes Hyperglycemia Presented with elevated blood sugars after insulin  pump jammed.  BG 667, beta hydroxybutyrate 1.56. UA >500 glucose, 20 ketones.  pH 7.49.  Anion gap 15 on admission.  Last A1c 05/03/2024 14.9.  Patient has recurrent hospitalizations for DKA.  Stated to admission team that Home regimen is 30 to 35 units long-acting insulin  daily and short acting insulin  9 units 3 times daily with large meals.  This was unlikely an infectious cause as he presented with negative ROS and WBC wnl. EKG was without evidence of ischemic change. Previously he had issues with adherence due to unhoused status, but had been able to use his mother's home to store his insulin .  He was started on Endo tool (start time 0239; stopped at 0406 in setting of hypoglycemia; restarted 0658). He has one episode of asymptomatic hypoglycemia treated with D5. AG closed x2 and he was transitioned off of Endotool. Social work and diabetic counselor consulted to help with new insulin  pump and housing status.    Hypokalemia repleted    Metabolic Alkalosis pH 7.49, pCO2 38.7, pO2 37, HCO3 29.5, AG 15. Reported no episodes of emesis prior to admission.  Denied diarrhea prior to admission. Chloride and CO2 on BMP WNL after endotool.    Pseudo-hyponatremia Presented with Na 126. Corrected Na 135-140. last BMP Na 137.  At 0515 patient was ANO x 4.  Likely secondary to hypovolemia in setting of hyperglycemia. Resolved after glucose correction.    Substance use disorder Admitted to taking methamphetamine the past 2 to 3 days, cocaine 9/23, marijuana and tobacco today, and had mikes hard lemonade around 3 PM 9/23. Patient stated it is very difficult for him to make better choices being without housing. He was counseled on lifestyle changes. Mom is trying to get him to Long Term Mental Health Group home. Social work onboard.    Schizoaffective disorder, bipolar type MDD with psychosis GAD Follows ACT team for  Haldol  injections.  Recently in ED for SI due to being kicked out of his brother's house.  Denied active SI or HI during admission.  Discharged on 07/17/2024 from ED after psychiatric reevaluation and stating he was no longer suicidal. I called mother and she stated she is trying to get him placed in long term mental health group home through [facility]. She asks if social work can call her to help her arrange aid. Social work consult placed stat to call mother and help arrange LT mental health group home care.   _______________________________________  Thank you for allowing us  to be part of your care. You were hospitalized for DKA. We treated you with insulin , fluids, and close monitoring  See the changes in your medications and management of your chronic conditions below:  *For your Type 2 diabetes  Make sure you take your insulin  medications as you reported they were prescribed to you  Insulin  Glargine: 30-35 Units Daily  Insulin  Lispro 9 units three times daily before large meals Please check your blood sugars daily in the morning before eating. If your blood sugars are consistently high (>200), please contact [facility] clinic.   The social worker will be following up with you and your mom regarding help with housing/placement.   FOLLOW UP APPOINTMENTS: We arranged for you to follow up with your Internal Medicine doctor September 30th at 10:45 with Dr. Elicia   Please call your PCP or our clinic if you have any questions or concerns, we may be able to help  and keep you from a long and expensive emergency room wait. Our clinic and after hours phone number is [phone]. The best time to call is Monday through Friday 9 am to 4 pm but there is always someone available 24/7 if you have an emergency. If you need medication refills please notify your pharmacy one week in advance and they will send us  a request.   We are glad you are feeling better,  Sallyanne Primas Internal Medicine  Inpatient Teaching Service at [facility]

## 2024-07-25 NOTE — ED Notes (Signed)
 Provided pt with water  and sandwich for PO challenge

## 2024-07-25 NOTE — Inpatient Diabetes Management (Signed)
 Inpatient Diabetes Program Recommendations  AACE/ADA: New Consensus Statement on Inpatient Glycemic Control (2015)  Target Ranges:  Prepandial:   less than 140 mg/dL      Peak postprandial:   less than 180 mg/dL (1-2 hours)      Critically ill patients:  140 - 180 mg/dL   Lab Results  Component Value Date   GLUCAP 96 07/25/2024   HGBA1C 14.9 (H) 05/03/2024    Latest Reference Range & Units 07/25/24 00:35 07/25/24 04:04 07/25/24 05:09 07/25/24 05:46 07/25/24 06:54 07/25/24 07:57 07/25/24 09:18  Glucose-Capillary 70 - 99 mg/dL >399 (HH) 877 (H) 48 (L) 139 (H) 225 (H) 197 (H) 96  (HH): Data is critically high (H): Data is abnormally high (L): Data is abnormally low   Diabetes history: type 1 Outpatient Diabetes medications: Tresiba  30 units daily, Novolog  9 units TID, ? Insulin  Pump Current orders for Inpatient glycemic control: IV insulin   Inpatient Diabetes Program Recommendations:   Noted patient had hypoglycemia while on IV insulin . Missed one hr. Check with no insulin  rate change. Will follow during hospitalization. Noted patient has had 6 inpatient admissions and 9 ED admissions over the past 6 months. Was discharged on 07/17/24. Followed by [name] in Internal Medicine.   Thank you, Tashara Suder E. Cliffard Hair, RN, MSN, CNS, CDCES  Diabetes Coordinator Inpatient Glycemic Control Team Team Pager [phone] (8am-5pm) 07/25/2024 10:08 AM

## 2024-07-25 NOTE — ED Notes (Signed)
 Checked patient cbg it was 28 patient is resting with call bell in reach

## 2024-07-25 NOTE — Progress Notes (Signed)
 HD#0 SUBJECTIVE:  Patient Summary: Jacob [name] is a 33 y.o. with a pertinent PMH of uncontrolled type 1 diabetes on insulin , GAD, schizoaffective disorder-bipolar type, MDD with psychosis, unhoused status who presented with complaints of high blood sugars and nausea admitted for hyperglycemia.   Overnight Events: 1 episode of hypoglycemia, asymptomatic. Resumed endo tool after resolution.   Interim History: Wanted to go to the day center in [place] but they were unable to take him due to high BG. He denied current N/V, abd pain, diarrhea, SOB, CP. He is currently without housing. His mother and grandma put him up in a hotel for 5 days, but now he is again without housing. Mother is trying to help place him in long-term mental health group home. Per Patient's request, he would like for me to update mother.   OBJECTIVE:  Vital Signs: Vitals:   07/25/24 0553 07/25/24 0600 07/25/24 0630 07/25/24 0803  BP:  (!) 125/96 127/87   Pulse:  70 65   Resp:  12 11   Temp: 98 F (36.7 C)     TempSrc: Oral     SpO2:  100% 99% 99%  Weight:      Height:       Supplemental O2: Room Air SpO2: 99 %  Filed Weights   07/25/24 0028  Weight: 59.4 kg     Intake/Output Summary (Last 24 hours) at 07/25/2024 0957 Last data filed at 07/25/2024 9486 Gross per 24 hour  Intake 1388 ml  Output --  Net 1388 ml   Net IO Since Admission: 1,388 mL [07/25/24 0957]  Physical Exam: Physical Exam Constitutional:      General: He is not in acute distress.    Appearance: He is not ill-appearing or toxic-appearing.  Cardiovascular:     Rate and Rhythm: Normal rate and regular rhythm.     Heart sounds: Normal heart sounds. No murmur heard.    No friction rub. No gallop.  Pulmonary:     Effort: Pulmonary effort is normal.     Breath sounds: Normal breath sounds. No stridor. No wheezing, rhonchi or rales.  Abdominal:     General: Abdomen is flat. Bowel sounds are normal.     Tenderness: There is no  abdominal tenderness. There is no guarding.  Musculoskeletal:     Right lower leg: No edema.     Left lower leg: No edema.  Skin:    General: Skin is warm and dry.  Neurological:     Mental Status: He is alert.     Patient Lines/Drains/Airways Status     Active Line/Drains/Airways     Name Placement date Placement time Site Days   Peripheral IV 07/25/24 20 G 1 Left Antecubital 07/25/24  0105  Antecubital  less than 1   Wound 05/21/24 1745 Pressure Injury Heel Left Deep Tissue Pressure Injury - Purple or maroon localized area of discolored intact skin or blood-filled blister due to damage of underlying soft tissue from pressure and/or shear. 05/21/24  1745  Heel  65   Wound 05/21/24 1745 Pressure Injury Heel Right Deep Tissue Pressure Injury - Purple or maroon localized area of discolored intact skin or blood-filled blister due to damage of underlying soft tissue from pressure and/or shear. 05/21/24  1745  Heel  65            Pertinent labs and imaging:     Latest Ref Rng & Units 07/25/2024    5:15 AM 07/25/2024    1:02 AM  07/25/2024   12:47 AM  CBC  WBC 4.0 - 10.5 K/uL 7.2   7.5   Hemoglobin 13.0 - 17.0 g/dL 87.4  84.9  85.8   Hematocrit 39.0 - 52.0 % 36.2  44.0  41.5   Platelets 150 - 400 K/uL 322   410        Latest Ref Rng & Units 07/25/2024    4:28 AM 07/25/2024    1:02 AM 07/25/2024   12:47 AM  CMP  Glucose 70 - 99 mg/dL 879   332   BUN 6 - 20 mg/dL 12   16   Creatinine 9.38 - 1.24 mg/dL 9.11   8.79   Sodium 864 - 145 mmol/L 137  126  126   Potassium 3.5 - 5.1 mmol/L 3.2  4.2  4.4   Chloride 98 - 111 mmol/L 98   88   CO2 22 - 32 mmol/L 28   23   Calcium  8.9 - 10.3 mg/dL 9.2   9.4   Total Protein 6.5 - 8.1 g/dL   7.3   Total Bilirubin 0.0 - 1.2 mg/dL   1.5   Alkaline Phos 38 - 126 U/L   122   AST 15 - 41 U/L   27   ALT 0 - 44 U/L   20     No results found.  ASSESSMENT/PLAN:  Assessment: Principal Problem:   Hyperglycemia due to type 1 diabetes mellitus  (HCC) Active Problems:   MDD (major depressive disorder), recurrent, severe, with psychosis (HCC)   Schizoaffective disorder, bipolar type (HCC)   Generalized anxiety disorder   Uncontrolled type 1 diabetes mellitus with hyperglycemia, with long-term current use of insulin  (HCC)   Homeless   Hyponatremia   Substance use disorder   Plan: Uncontrolled type 1 diabetes Hyperglycemia Presented with elevated blood sugars after insulin  pump jammed.  BG 667, beta hydroxybutyrate 1.56. UA >500 glucose, 20 ketones.  pH 7.49.  Anion gap 15 on admission.  Last A1c 05/03/2024 14.9.  Patient has recurrent hospitalizations for DKA.  Stated to admission team that Home regimen is 30 to 35 units long-acting insulin  daily and short acting insulin  9 units 3 times daily with large meals.  Previously he had issues with adherence due to unhoused status, but had been able to use his mother's home to store his insulin .  He was started on Endo tool (start time 0239; stopped at 0406 in setting of hypoglycemia; restarted 0658). This am he has no current complaints.  Glucose trends since admission: >600--> 122--> 48 (asymptomatic, given dextrose  50%)--> 139 -->225. Anion gap trend since admission: 15--> 11 - Once AG is <12 x 2, initiate SubQ insulin  and encourage oral intake - Tylenol  650 mg every 6 hours as needed - Help procure new insulin  pump at discharge - Social work consulted   Hypokalemia K 3.2. Likely in response to insulin .  Endo tool stopped 0406.  Received KCl 10 mEq IV x 2 and KCl 40 mEq oral. - will review next BMP  Metabolic Alkalosis pH 7.49, pCO2 38.7, pO2 37, HCO3 29.5, AG 15 Reported no episodes of emesis prior to admission.  Denied diarrhea prior to admission.   -continue to monitor   Pseudo-hyponatremia-resolved Presented with Na 126. Corrected Na 135-140. last BMP Na 137.  At 0515 patient was ANO x 4.  Likely secondary to hypovolemia in setting of hyperglycemia. - Monitor BMP  Substance use  disorder Admitted to taking methamphetamine the past 2 to 3 days, cocaine 9/23,  marijuana and tobacco today, and had mikes hard lemonade around 3 PM 9/23. Patient stated it is very difficult for him to make better choices being without housing.  - Counseled on lifestyle changes  Schizoaffective disorder, bipolar type MDD with psychosis GAD Follows ACT team for Haldol  injections.  Recently in ED for SI due to being kicked out of his brother's house.  Denied active SI or HI during admission.  Discharged on 07/17/2024 from ED after psychiatric reevaluation and stating he was no longer suicidal. Called mother and she stated she is trying to get him placed in long term mental health group home through [facility]. She asks if social work can call her to help her arrange aid.  - Ensure adherence to medication -social work consult placed stat to call mother and help arrange LT mental health group home care.   Best Practice: Diet: Diabetic diet IVF: Fluids: D5-LR, Rate: 125 cc/hr x 24 hrs VTE: rivaroxaban  (XARELTO ) tablet 10 mg Start: 07/25/24 1000 Code: Full  Disposition planning: Therapy Recs: Pending, DME: other insulin  pump Family Contact: mother, called and notified. DISPO: Anticipated discharge today to mother's hom, hopeful group home placement with social work aid pending Inaccessible home environment.  Signature:  Sallyanne Benuel [facility]  9:57 AM, 07/25/2024  On Call pager [phone]

## 2024-07-25 NOTE — ED Notes (Signed)
 Hourly rounding complete. Pt alert or resting, no distress noted, offered toileting and diet as appropriate. Side rails up, call light within reach. Pt denies pain or further needs at this time.

## 2024-07-25 NOTE — ED Triage Notes (Addendum)
 Pt in ambulatory w/reported DKA symptoms. States sugars have been high at home, states his insulin  jammed and he hasn't been able to self-administer it x few days

## 2024-07-26 ENCOUNTER — Other Ambulatory Visit: Payer: MEDICAID

## 2024-07-26 ENCOUNTER — Telehealth: Payer: Self-pay

## 2024-07-26 ENCOUNTER — Encounter: Payer: MEDICAID | Admitting: Student

## 2024-07-26 NOTE — Telephone Encounter (Signed)
 Attempted to contact patient for scheduled appointment for medication management. Left HIPAA compliant message for patient to return my call at their convenience.   Noted that patient was in ED yesterday for DKA. BG was >600 mg/dL, which patient reported was due to insulin  pump malfunction, though patient was supposed to be on basal/bolus insulin  at that time. Patient and his mother are currently working with [facility] to have him placed in a long term mental health group home. SW was consulted during admission and outreached a [facility]. Noted that. Dexcom G7 sensors/receiver have not been picked up from pharmacy yet.   Will re-attempt outreach next week.  Lorain Baseman, PharmD [facility] [phone]

## 2024-07-26 NOTE — Progress Notes (Deleted)
 07/26/2024 Name: Jacob [name] MRN: [id number] DOB: 06/15/1991  No chief complaint on file.   Jacob [name] is a 33 y.o. year old male who presented for a telephone visit. Patient's mother provides the history, but patient is also present on the call.   They were referred to the pharmacist by a quality report for assistance in managing diabetes in the setting of frequent hospital admissions. PMH includes T1DM with recurrent DKA, depression, schizoaffective disorder, tobacco use.   Subjective: Patient was recently admitted at [facility] from 06/21/24 to 06/22/24 for DKA. Toxicology was + for [facility] and cocaine. Patient was last seen by PCP, [name], DO, on 06/27/24 for hospital follow-up. At last visit, patient reported he had been taking his medications and monitoring his sugars. Reports FBG of mid 100s, afternoon BG in the mid-200s, pre-dinner mid-300s mg/dL. He was instructed to increase Novolog  to 9 units with breakfast, lunch, and dinner and continue Lantus  30 units daily. He was also seen by [facility], [name] in clinic. Recommended to switch to Tresiba  for basal insulin  when they are ready for subsequent refills. Patient was engaged by pharmacy via telephone on 07/05/24. His mother reports that they misplaced Lantus  and the pharmacy was not able to override and fill it. He was transitioned to Tresiba , as insurance was allowing a 90 day fill of this. They reported they do not have Dexcom G7 sensors remaining at home, but would be willing to use it with a reader device (as he does not currently have a cell phone). Patient went to the ED on 07/09/24 for anxiety and s/sx of dehydration, but was not admitted.   Today, ***  Today, patient's mother reports that they have misplaced Lantus  and the pharmacy has not been able to override it to fill today. Reports that he took Lantus  this AM but now is out. Reports that he has increased Novolog  to 9 units TID. Reports patient is feeling ok - endorses fatigue. Denies n/v.  His mother is concerned that he continues to eat things he should not be eating. Reports they do not believe they have any sensors remaining at home, but he does not have a cell phone so would not be able to connect it to his phone. He would be willing to use a reader device.   F/u getting Dexcom reader/applying CGM   Care Team: Primary Care Provider: [name], DO and [name], RD ; Next Scheduled Visit: 07/31/24   Medication Access/Adherence  Current Pharmacy:  [id number] [address], [place] - [address] [place] [address] Phone: [phone] Fax: [phone]  [name] [address] [place] [address] Phone: [phone] Fax: [phone]  [place] [facility] [address] [place] [address] Phone: [phone] Fax: [phone]  [name] [address] [place] [address] Phone: [phone] Fax: [phone]   Patient reports affordability concerns with their medications: Yes  - unable to fill Lantus  (picked up on 8/25, but unable to find and [facility]) Patient reports access/transportation concerns to their pharmacy: No  - reports she would be able to pick up medication at [facility] if available today Patient reports adherence concerns with their medications:  Yes  - patient has a longstanding hx of nonadherence to insulin  and psych medications  Type 1 Diabetes:  Current medications: Lantus  30 units daily, Novolog  9  units TID with meals  Current glucose readings: unable to share specific readings today. Reports he would be interested in resuming use of Dexcom device, though he would need to use a reader since he does not have a cell phone right now.  Per test claim - reader for Dexcom G7 requires a PA. Insurance last dispensed reader for G6 in Jan 2025.  Patient  denies hypoglycemic s/sx including dizziness, shakiness, sweating. Patient reports hyperglycemic symptoms including fatigue.   Current meal patterns: Eats ~10 times per day - includes pastas, grits, eggs, sandwiches, M&Ms, skittles, and cereal.   Current physical activity: did not discuss today  Current medication access support: Medicaid insurance ([employer])   Objective:  BP Readings from Last 3 Encounters:  07/25/24 (!) 124/53  07/17/24 101/79  07/14/24 (!) 129/102    Lab Results  Component Value Date   HGBA1C 14.9 (H) 05/03/2024   HGBA1C 14.5 (H) 02/07/2024   HGBA1C 14.8 (H) 01/28/2024       Latest Ref Rng & Units 07/25/2024    9:26 AM 07/25/2024    4:28 AM 07/25/2024    1:02 AM  BMP  Glucose 70 - 99 mg/dL 78  879    BUN 6 - 20 mg/dL 10  12    Creatinine 9.38 - 1.24 mg/dL 9.17  9.11    Sodium 864 - 145 mmol/L 139  137  126   Potassium 3.5 - 5.1 mmol/L 3.3  3.2  4.2   Chloride 98 - 111 mmol/L 100  98    CO2 22 - 32 mmol/L 29  28    Calcium  8.9 - 10.3 mg/dL 8.7  9.2      Lab Results  Component Value Date   CHOL 201 (H) 01/28/2024   HDL 75 01/28/2024   LDLCALC 109 (H) 01/28/2024   TRIG 85 01/28/2024   CHOLHDL 2.7 01/28/2024    Medications Reviewed Today   Medications were not reviewed in this encounter       Assessment/Plan:   Diabetes: - Currently uncontrolled with most recent A1C of 14.9% above goal <7%. Medication adherence appears inappropriate. His mother assists with medication management, but reports that she is not able to locate the Lantus  that was picked up on 06/25/24. She is agreeable to switching to Tresiba , which is also more appropriate treatment for patient given long duration of action, which may help prevent DKA if patient has a delay in administering his dose. Denies s/sx of hypoglycemia with recent increase in Novolog . - Last UACR July 2025: < 5 mg/g - Reviewed goal A1c, goal fasting, and goal 2 hour post prandial glucose - Reviewed  hypoglycemia management plan and the rule of 15 - Reviewed dietary modifications including  utilizing the healthy plate method, limiting portion size of carbohydrate foods, increasing intake of protein and non-starchy vegetables. Counseled patient to stay hydrated with water  throughout the day. - Recommend to switch Lantus  to Tresiba  30 units daily for insurance access and longer duration of action. Collaborated with PCP to place orders. Patient's mother agreeable to [facility] up [facility]  - Recommend to continue Novolog  9 units TID with meals. Patient's mother confirms that she has ~2 boxes of this remaining in the fridge. - Recommend to check glucose twice daily: fasting and 2-hr PPG . Counseled patient to bring glucometer or BG log to every appointment. Will collaborate with CPhT to complete PA for Dexcom G7 sensors and reader - Next  A1C due October 2025  [id number]    Written patient instructions provided. Patient verbalized understanding of treatment plan.   Follow Up Plan:  Pharmacist telephone 07/26/24 PCP/[facility] clinic visit in 07/31/24   Lorain Baseman, PharmD [facility] [phone]

## 2024-07-29 ENCOUNTER — Inpatient Hospital Stay (HOSPITAL_COMMUNITY)
Admission: EM | Admit: 2024-07-29 | Discharge: 2024-08-03 | DRG: 637 | Disposition: A | Discharge status: Other Acute Inpatient Hospital | Payer: MEDICAID | Attending: Family Medicine | Admitting: Family Medicine

## 2024-07-29 ENCOUNTER — Encounter (HOSPITAL_COMMUNITY): Payer: Self-pay | Admitting: Emergency Medicine

## 2024-07-29 ENCOUNTER — Emergency Department (HOSPITAL_COMMUNITY): Payer: MEDICAID

## 2024-07-29 ENCOUNTER — Other Ambulatory Visit: Payer: Self-pay

## 2024-07-29 ENCOUNTER — Inpatient Hospital Stay (HOSPITAL_COMMUNITY): Payer: MEDICAID

## 2024-07-29 DIAGNOSIS — E1165 Type 2 diabetes mellitus with hyperglycemia: Principal | ICD-10-CM

## 2024-07-29 DIAGNOSIS — E1100 Type 2 diabetes mellitus with hyperosmolarity without nonketotic hyperglycemic-hyperosmolar coma (NKHHC): Secondary | ICD-10-CM

## 2024-07-29 DIAGNOSIS — E871 Hypo-osmolality and hyponatremia: Secondary | ICD-10-CM | POA: Diagnosis present

## 2024-07-29 DIAGNOSIS — F250 Schizoaffective disorder, bipolar type: Secondary | ICD-10-CM | POA: Diagnosis present

## 2024-07-29 DIAGNOSIS — G9341 Metabolic encephalopathy: Secondary | ICD-10-CM | POA: Diagnosis present

## 2024-07-29 DIAGNOSIS — F1290 Cannabis use, unspecified, uncomplicated: Secondary | ICD-10-CM | POA: Diagnosis present

## 2024-07-29 DIAGNOSIS — E876 Hypokalemia: Secondary | ICD-10-CM | POA: Diagnosis not present

## 2024-07-29 DIAGNOSIS — E1110 Type 2 diabetes mellitus with ketoacidosis without coma: Secondary | ICD-10-CM | POA: Diagnosis present

## 2024-07-29 DIAGNOSIS — E10649 Type 1 diabetes mellitus with hypoglycemia without coma: Secondary | ICD-10-CM | POA: Diagnosis not present

## 2024-07-29 DIAGNOSIS — E860 Dehydration: Secondary | ICD-10-CM | POA: Diagnosis present

## 2024-07-29 DIAGNOSIS — N179 Acute kidney failure, unspecified: Secondary | ICD-10-CM | POA: Diagnosis present

## 2024-07-29 DIAGNOSIS — Z560 Unemployment, unspecified: Secondary | ICD-10-CM

## 2024-07-29 DIAGNOSIS — E1010 Type 1 diabetes mellitus with ketoacidosis without coma: Principal | ICD-10-CM | POA: Diagnosis present

## 2024-07-29 DIAGNOSIS — J00 Acute nasopharyngitis [common cold]: Secondary | ICD-10-CM | POA: Diagnosis present

## 2024-07-29 DIAGNOSIS — Z91199 Patient's noncompliance with other medical treatment and regimen due to unspecified reason: Secondary | ICD-10-CM

## 2024-07-29 DIAGNOSIS — Z5902 Unsheltered homelessness: Secondary | ICD-10-CM

## 2024-07-29 DIAGNOSIS — F14159 Cocaine abuse with cocaine-induced psychotic disorder, unspecified: Secondary | ICD-10-CM | POA: Diagnosis present

## 2024-07-29 DIAGNOSIS — T50916A Underdosing of multiple unspecified drugs, medicaments and biological substances, initial encounter: Secondary | ICD-10-CM | POA: Diagnosis present

## 2024-07-29 DIAGNOSIS — Z781 Physical restraint status: Secondary | ICD-10-CM

## 2024-07-29 DIAGNOSIS — B9789 Other viral agents as the cause of diseases classified elsewhere: Secondary | ICD-10-CM | POA: Diagnosis present

## 2024-07-29 DIAGNOSIS — E1310 Other specified diabetes mellitus with ketoacidosis without coma: Secondary | ICD-10-CM | POA: Diagnosis not present

## 2024-07-29 DIAGNOSIS — D72829 Elevated white blood cell count, unspecified: Secondary | ICD-10-CM | POA: Diagnosis not present

## 2024-07-29 DIAGNOSIS — G9340 Encephalopathy, unspecified: Secondary | ICD-10-CM | POA: Diagnosis not present

## 2024-07-29 DIAGNOSIS — F1490 Cocaine use, unspecified, uncomplicated: Secondary | ICD-10-CM | POA: Diagnosis not present

## 2024-07-29 HISTORY — DX: Type 2 diabetes mellitus without complications: E11.9

## 2024-07-29 LAB — BASIC METABOLIC PANEL WITH GFR
Anion gap: 28 — ABNORMAL HIGH (ref 5–15)
BUN: 48 mg/dL — ABNORMAL HIGH (ref 6–20)
BUN: 39 mg/dL — ABNORMAL HIGH (ref 6–20)
CO2: <7 mmol/L — ABNORMAL LOW (ref 22–32)
CO2: 10 mmol/L — ABNORMAL LOW (ref 22–32)
Calcium: 13.2 mg/dL (ref 8.9–10.3)
Calcium: 11.6 mg/dL — ABNORMAL HIGH (ref 8.9–10.3)
Chloride: 82 mmol/L — ABNORMAL LOW (ref 98–111)
Chloride: 104 mmol/L (ref 98–111)
Creatinine, Ser: 4.94 mg/dL — ABNORMAL HIGH (ref 0.61–1.24)
Creatinine, Ser: 3.81 mg/dL — ABNORMAL HIGH (ref 0.61–1.24)
GFR, Estimated: 7 mL/min — ABNORMAL LOW (ref >60)
GFR, Estimated: 21 mL/min — ABNORMAL LOW (ref >60)
Glucose, Bld: >1200 mg/dL (ref 70–99)
Glucose, Bld: 710 mg/dL (ref 70–99)
Potassium: >7.5 mmol/L (ref 3.5–5.1)
Potassium: 4.7 mmol/L (ref 3.5–5.1)
Sodium: 122 mmol/L — ABNORMAL LOW (ref 135–145)
Sodium: 142 mmol/L (ref 135–145)

## 2024-07-29 LAB — CBC WITH DIFFERENTIAL/PLATELET
Basophils Absolute: 0.3 K/uL — ABNORMAL HIGH (ref 0.0–0.1)
Basophils Relative: 1 %
Eosinophils Absolute: 0 K/uL (ref 0.0–0.5)
Eosinophils Relative: 0 %
HCT: 50 % (ref 39.0–52.0)
HCT: 48.7 % (ref 39.0–52.0)
Hemoglobin: 13.1 g/dL (ref 13.0–17.0)
Hemoglobin: 13 g/dL (ref 13.0–17.0)
Lymphocytes Relative: 5 %
Lymphs Abs: 1.7 K/uL (ref 0.7–4.0)
MCH: 28.4 pg (ref 26.0–34.0)
MCH: 28.9 pg (ref 26.0–34.0)
MCHC: 26.2 g/dL — ABNORMAL LOW (ref 30.0–36.0)
MCHC: 26.7 g/dL — ABNORMAL LOW (ref 30.0–36.0)
MCV: 108.5 fL — ABNORMAL HIGH (ref 80.0–100.0)
MCV: 108.2 fL — ABNORMAL HIGH (ref 80.0–100.0)
Monocytes Absolute: 0.3 K/uL (ref 0.1–1.0)
Monocytes Relative: 1 %
Neutro Abs: 31.7 K/uL — ABNORMAL HIGH (ref 1.7–7.7)
Neutrophils Relative %: 93 %
Platelets: 492 K/uL — ABNORMAL HIGH (ref 150–400)
Platelets: 468 K/uL — ABNORMAL HIGH (ref 150–400)
RBC: 4.61 MIL/uL (ref 4.22–5.81)
RBC: 4.5 MIL/uL (ref 4.22–5.81)
RDW: 13.9 % (ref 11.5–15.5)
RDW: 13.7 % (ref 11.5–15.5)
WBC: 34.1 K/uL — ABNORMAL HIGH (ref 4.0–10.5)
WBC: 35.8 K/uL — ABNORMAL HIGH (ref 4.0–10.5)
nRBC: 0 % (ref 0.0–0.2)
nRBC: 0 % (ref 0.0–0.2)

## 2024-07-29 LAB — GLUCOSE, CAPILLARY
Glucose-Capillary: 419 mg/dL — ABNORMAL HIGH (ref 70–99)
Glucose-Capillary: 474 mg/dL — ABNORMAL HIGH (ref 70–99)
Glucose-Capillary: 539 mg/dL (ref 70–99)
Glucose-Capillary: >600 mg/dL (ref 70–99)
Glucose-Capillary: >600 mg/dL (ref 70–99)
Glucose-Capillary: >600 mg/dL (ref 70–99)
Glucose-Capillary: >600 mg/dL (ref 70–99)

## 2024-07-29 LAB — URINALYSIS, ROUTINE W REFLEX MICROSCOPIC
Bilirubin Urine: NEGATIVE
Glucose, UA: >=500 mg/dL — AB
Ketones, ur: 20 mg/dL — AB
Leukocytes,Ua: NEGATIVE
Nitrite: NEGATIVE
Protein, ur: 100 mg/dL — AB
Specific Gravity, Urine: 1.02 (ref 1.005–1.030)
pH: 5 (ref 5.0–8.0)

## 2024-07-29 LAB — I-STAT VENOUS BLOOD GAS, ED
Acid-base deficit: 28 mmol/L — ABNORMAL HIGH (ref 0.0–2.0)
Bicarbonate: 3.7 mmol/L — ABNORMAL LOW (ref 20.0–28.0)
Calcium, Ion: 0.98 mmol/L — ABNORMAL LOW (ref 1.15–1.40)
HCT: 47 % (ref 39.0–52.0)
Hemoglobin: 16 g/dL (ref 13.0–17.0)
O2 Saturation: 55 %
Potassium: >8.5 mmol/L (ref 3.5–5.1)
Sodium: 117 mmol/L — CL (ref 135–145)
TCO2: <5 mmol/L — ABNORMAL LOW (ref 22–32)
pCO2, Ven: 18.7 mmHg — CL (ref 44–60)
pH, Ven: 6.906 — CL (ref 7.25–7.43)
pO2, Ven: 47 mmHg — ABNORMAL HIGH (ref 32–45)

## 2024-07-29 LAB — POCT I-STAT 7, (LYTES, BLD GAS, ICA,H+H)
Acid-base deficit: 16 mmol/L — ABNORMAL HIGH (ref 0.0–2.0)
Bicarbonate: 9.4 mmol/L — ABNORMAL LOW (ref 20.0–28.0)
Calcium, Ion: 1.61 mmol/L (ref 1.15–1.40)
HCT: 39 % (ref 39.0–52.0)
Hemoglobin: 13.3 g/dL (ref 13.0–17.0)
O2 Saturation: 99 %
Patient temperature: 37
Potassium: 4.5 mmol/L (ref 3.5–5.1)
Sodium: 139 mmol/L (ref 135–145)
TCO2: 10 mmol/L — ABNORMAL LOW (ref 22–32)
pCO2 arterial: 21.4 mmHg — ABNORMAL LOW (ref 32–48)
pH, Arterial: 7.251 — ABNORMAL LOW (ref 7.35–7.45)
pO2, Arterial: 170 mmHg — ABNORMAL HIGH (ref 83–108)

## 2024-07-29 LAB — RAPID URINE DRUG SCREEN, HOSP PERFORMED
Amphetamines: NOT DETECTED
Barbiturates: NOT DETECTED
Benzodiazepines: NOT DETECTED
Cocaine: POSITIVE — AB
Opiates: NOT DETECTED
Tetrahydrocannabinol: POSITIVE — AB

## 2024-07-29 LAB — BETA-HYDROXYBUTYRIC ACID
Beta-Hydroxybutyric Acid: >8 mmol/L — ABNORMAL HIGH (ref 0.05–0.27)
Beta-Hydroxybutyric Acid: >8 mmol/L — ABNORMAL HIGH (ref 0.05–0.27)

## 2024-07-29 LAB — CBG MONITORING, ED
Glucose-Capillary: >600 mg/dL (ref 70–99)
Glucose-Capillary: >600 mg/dL (ref 70–99)
Glucose-Capillary: >600 mg/dL (ref 70–99)
Glucose-Capillary: >600 mg/dL (ref 70–99)

## 2024-07-29 LAB — ETHANOL: Alcohol, Ethyl (B): <15 mg/dL (ref <15)

## 2024-07-29 LAB — PROTIME-INR
INR: 1 (ref 0.8–1.2)
Prothrombin Time: 13.6 s (ref 11.4–15.2)

## 2024-07-29 LAB — OSMOLALITY
Osmolality: 409 mosm/kg (ref 275–295)
Osmolality: 413 mosm/kg (ref 275–295)
Osmolality: 372 mosm/kg (ref 275–295)

## 2024-07-29 LAB — I-STAT CG4 LACTIC ACID, ED: Lactic Acid, Venous: 7.2 mmol/L (ref 0.5–1.9)

## 2024-07-29 LAB — MAGNESIUM: Magnesium: 3.1 mg/dL — ABNORMAL HIGH (ref 1.7–2.4)

## 2024-07-29 LAB — PHOSPHORUS: Phosphorus: 4.9 mg/dL — ABNORMAL HIGH (ref 2.5–4.6)

## 2024-07-29 LAB — AMMONIA: Ammonia: 48 umol/L — ABNORMAL HIGH (ref 9–35)

## 2024-07-29 LAB — CK: Total CK: 145 U/L (ref 49–397)

## 2024-07-29 MED ORDER — NALOXONE HCL 0.4 MG/ML IJ SOLN
INTRAMUSCULAR | Status: AC
  Filled 2024-07-29: qty 1

## 2024-07-29 MED ORDER — DEXTROSE IN LACTATED RINGERS 5 % IV SOLN: INTRAVENOUS | Status: AC

## 2024-07-29 MED ORDER — VANCOMYCIN VARIABLE DOSE PER UNSTABLE RENAL FUNCTION (PHARMACIST DOSING): Status: DC

## 2024-07-29 MED ORDER — DOCUSATE SODIUM 100 MG PO CAPS: 100.0000 mg | ORAL_CAPSULE | Freq: Two times a day (BID) | ORAL | Status: DC | PRN

## 2024-07-29 MED ORDER — INSULIN REGULAR(HUMAN) IN NACL 100-0.9 UT/100ML-% IV SOLN
INTRAVENOUS | Status: DC
  Administered 2024-07-29 (×2): 6 [IU]/h via INTRAVENOUS
  Filled 2024-07-29: qty 100

## 2024-07-29 MED ORDER — DEXTROSE 50 % IV SOLN: 0.0000 mL | INTRAVENOUS | Status: DC | PRN

## 2024-07-29 MED ORDER — CALCIUM GLUCONATE 10 % IV SOLN
1.0000 g | Freq: Once | INTRAVENOUS | Status: AC
  Administered 2024-07-29 (×2): 1 g via INTRAVENOUS
  Filled 2024-07-29: qty 10

## 2024-07-29 MED ORDER — CALCIUM CHLORIDE 10 % IV SOLN
INTRAVENOUS | Status: AC
  Filled 2024-07-29: qty 20

## 2024-07-29 MED ORDER — PIPERACILLIN-TAZOBACTAM 3.375 G IVPB 30 MIN
3.3750 g | Freq: Once | INTRAVENOUS | Status: AC
  Administered 2024-07-29 (×2): 3.375 g via INTRAVENOUS
  Filled 2024-07-29: qty 50

## 2024-07-29 MED ORDER — POLYETHYLENE GLYCOL 3350 17 G PO PACK: 17.0000 g | PACK | Freq: Every day | ORAL | Status: DC | PRN

## 2024-07-29 MED ORDER — THIAMINE HCL 100 MG/ML IJ SOLN
500.0000 mg | Freq: Three times a day (TID) | INTRAVENOUS | Status: DC
  Administered 2024-07-29 – 2024-08-03 (×28): 500 mg via INTRAVENOUS
  Filled 2024-07-29: qty 450
  Filled 2024-07-29 (×8): qty 5
  Filled 2024-07-29: qty 500
  Filled 2024-07-29 (×4): qty 5
  Filled 2024-07-29 (×2): qty 500
  Filled 2024-07-29: qty 5

## 2024-07-29 MED ORDER — ALBUTEROL SULFATE HFA 108 (90 BASE) MCG/ACT IN AERS: 2.0000 | INHALATION_SPRAY | RESPIRATORY_TRACT | Status: DC

## 2024-07-29 MED ORDER — VANCOMYCIN HCL 1500 MG/300ML IV SOLN
1500.0000 mg | Freq: Once | INTRAVENOUS | Status: AC
  Administered 2024-07-29 (×2): 1500 mg via INTRAVENOUS
  Filled 2024-07-29: qty 300

## 2024-07-29 MED ORDER — METOPROLOL TARTRATE 5 MG/5ML IV SOLN
5.0000 mg | Freq: Once | INTRAVENOUS | Status: AC
  Administered 2024-07-29 (×2): 5 mg via INTRAVENOUS
  Filled 2024-07-29: qty 5

## 2024-07-29 MED ORDER — LACTATED RINGERS IV BOLUS
2000.0000 mL | Freq: Once | INTRAVENOUS | Status: AC
  Administered 2024-07-29 (×2): 2000 mL via INTRAVENOUS

## 2024-07-29 MED ORDER — ALBUTEROL SULFATE (2.5 MG/3ML) 0.083% IN NEBU: 2.5000 mg | INHALATION_SOLUTION | RESPIRATORY_TRACT | Status: DC

## 2024-07-29 MED ORDER — LACTATED RINGERS IV SOLN: INTRAVENOUS | Status: AC

## 2024-07-29 MED ORDER — SODIUM BICARBONATE 8.4 % IV SOLN
50.0000 meq | Freq: Once | INTRAVENOUS | Status: AC
  Administered 2024-07-29 (×2): 50 meq via INTRAVENOUS
  Filled 2024-07-29: qty 50

## 2024-07-29 MED ORDER — PIPERACILLIN-TAZOBACTAM IN DEX 2-0.25 GM/50ML IV SOLN
2.2500 g | Freq: Four times a day (QID) | INTRAVENOUS | Status: DC
  Administered 2024-07-30 (×2): 2.25 g via INTRAVENOUS
  Filled 2024-07-29 (×3): qty 50

## 2024-07-29 MED ORDER — ALBUTEROL SULFATE (2.5 MG/3ML) 0.083% IN NEBU
10.0000 mg | INHALATION_SOLUTION | Freq: Once | RESPIRATORY_TRACT | Status: AC
  Administered 2024-07-29 (×2): 10 mg via RESPIRATORY_TRACT
  Filled 2024-07-29: qty 12

## 2024-07-29 MED ORDER — CALCIUM GLUCONATE 10 % IV SOLN
1.0000 g | Freq: Once | INTRAVENOUS | Status: AC
  Administered 2024-07-29 (×2): 1 g via INTRAVENOUS

## 2024-07-29 MED ORDER — STERILE WATER FOR INJECTION IV SOLN
INTRAVENOUS | Status: DC
  Filled 2024-07-29: qty 1000
  Filled 2024-07-29: qty 150

## 2024-07-29 MED ORDER — LACTATED RINGERS IV BOLUS
2000.0000 mL | INTRAVENOUS | Status: AC
  Administered 2024-07-29 (×2): 2000 mL via INTRAVENOUS

## 2024-07-29 MED ORDER — CALCIUM GLUCONATE 10 % IV SOLN
INTRAVENOUS | Status: AC
  Filled 2024-07-29: qty 10

## 2024-07-29 MED ORDER — SODIUM CHLORIDE 0.9 % IV BOLUS
2000.0000 mL | Freq: Once | INTRAVENOUS | Status: DC
Start: 2024-07-29 — End: 2024-07-31

## 2024-07-29 MED ORDER — CALCIUM CHLORIDE 10 % IV SOLN
INTRAVENOUS | Status: DC | PRN
Start: 2024-07-29 — End: 2024-07-29
  Administered 2024-07-29 (×2): 1 g via INTRAVENOUS

## 2024-07-29 MED ORDER — CHLORHEXIDINE GLUCONATE CLOTH 2 % EX PADS
6.0000 | MEDICATED_PAD | Freq: Every day | CUTANEOUS | Status: DC
Start: 2024-07-29 — End: 2024-08-03
  Administered 2024-07-29 – 2024-08-03 (×12): 6 via TOPICAL

## 2024-07-29 MED ORDER — NALOXONE HCL 2 MG/2ML IJ SOSY
PREFILLED_SYRINGE | INTRAMUSCULAR | Status: DC | PRN
Start: 2024-07-29 — End: 2024-07-29
  Administered 2024-07-29 (×2): .4 mg via INTRAVENOUS

## 2024-07-29 NOTE — ED Notes (Signed)
 Pt to CT at this time, then transport to floor

## 2024-07-29 NOTE — ED Triage Notes (Signed)
 Per [facility] pt coming in as unresponsive- was found outside and found to be hyperglycemia and hypotensive. Administered 500 CC NS en route. CBG HIGH. Patient moaning.

## 2024-07-29 NOTE — Progress Notes (Signed)
 ABG results given in person to Dr. Maree.

## 2024-07-29 NOTE — H&P (Signed)
 NAME:  Jacob [name], MRN:  [id number], DOB:  11/01/1874, LOS: 0 ADMISSION DATE:  07/29/2024, CONSULTATION DATE:  07/27/2024 REFERRING MD:  ED, CHIEF COMPLAINT: Unresponsive/DKA  History of Present Illness:  Patient is a unknown male of unknown age who was found down by EMS unresponsive with undetectably high glucose per EMS.  On ED arrival patient was seen hypothermic and tachycardic with decreased mentation.  Preliminary blood work concerning for HHS versus DKA with pH 6.90 CO2 18, O2 47 and bicarb 3.7 full chemistry pending at time of evaluation but i-STAT potassium critically high at 8.5.  CBC also significant for WBC 13.8 and platelets 468.  Critical care consulted for further management admission..  Pertinent  Medical History  Known  Significant Hospital Events: Including procedures, antibiotic start and stop dates in addition to other pertinent events   9/28 found unresponsive with critically high glucose workup on admission appears consistent with DKA  Interim History / Subjective:  As above  Objective    Blood pressure (!) 141/89, pulse (!) 120, temperature (S) (!) 95.2 F (35.1 C), temperature source Rectal, resp. rate 12, height 5' 9 (1.753 m), weight 65.8 kg, SpO2 100%.        Intake/Output Summary (Last 24 hours) at 07/29/2024 1826 Last data filed at 07/29/2024 1755 Gross per 24 hour  Intake 2000 ml  Output 200 ml  Net 1800 ml   Filed Weights   07/29/24 1745  Weight: 65.8 kg    Examination: General: Acute ill appearing adult male lying in bed, in NAD HEENT: [place]/AT, MM pink/moist, PERRL,  Neuro: Moans to verbal stimuli, non-focal  CV: Sinus tachy regular rate and rhythm, no murmur, rubs, or gallops,  PULM:  Clear to ascultation, tachypenic, no added breath sounds  GI: soft, bowel sounds active in all 4 quadrants, non-tender, non-distended Extremities: warm/dry, no edema  Skin: no rashes or lesions   Resolved problem list   Assessment and Plan  Clinical  exam and preliminary workup appear consistent with diabetic ketoacidosis - pH 6.9 with pCO2 18.7 coupled with altered mental status and high point-of-care CBG reading.  Full chemistry pending at time of admission. P: Aggressive IV hydration started in ED, continue Once potassium confirmed on chemistry start insulin  drip Closely monitor electrolytes Maintain potassium greater than 4 Every CBG checks Once blood glucose falls below 250 start patient on D5 IV fluids Monitor renal function Will discuss with attending benefit of initiating bicarb infusion Check hemoglobin A1c N.p.o.  Altered mental status secondary to above P: Maintain neuro protective measures; goal for eurothermia, euglycemia, eunatermia, normoxia, and PCO2 goal of 35-40 Correction of electrolytes abnormalities as above Aspirations precautions  CT pending   Leukocytosis with lactic acidosis  - Likely reactive to severe dehydration in the setting of DKA, received Zosyn and Vancomycin in ED  P: Continue vanc and zosyn empirically  Follow urine   Labs   CBC: Recent Labs  Lab 07/29/24 1719 07/29/24 1724 07/29/24 1733  WBC 34.1*  --  35.8*  NEUTROABS 31.7*  --  DUPLICATE REQUEST  SEE [id number]  HGB 13.1 16.0 13.0  HCT 50.0 47.0 48.7  MCV 108.5*  --  108.2*  PLT 492*  --  468*    Basic Metabolic Panel: Recent Labs  Lab 07/29/24 1724  NA 117*  K >8.5*   GFR: CrCl cannot be calculated (No successful lab value found.). Recent Labs  Lab 07/29/24 1719 07/29/24 1733  WBC 34.1* 35.8*    Liver Function Tests:  No results for input(s): AST, ALT, ALKPHOS, BILITOT, PROT, ALBUMIN in the last 168 hours. No results for input(s): LIPASE, AMYLASE in the last 168 hours. No results for input(s): AMMONIA in the last 168 hours.  ABG    Component Value Date/Time   HCO3 3.7 (L) 07/29/2024 1724   TCO2 <5 (L) 07/29/2024 1724   ACIDBASEDEF 28.0 (H) 07/29/2024 1724   O2SAT 55 07/29/2024 1724      Coagulation Profile: No results for input(s): INR, PROTIME in the last 168 hours.  Cardiac Enzymes: No results for input(s): CKTOTAL, CKMB, CKMBINDEX, TROPONINI in the last 168 hours.  HbA1C: No results found for: HGBA1C  CBG: Recent Labs  Lab 07/29/24 1717  GLUCAP >600*    Review of Systems:   Unable to assess   Past Medical History:  He,  has a past medical history of Diabetes mellitus without complication (HCC).   Surgical History:  History reviewed. No pertinent surgical history.   Social History:      Family History:  His family history is not on file.   Allergies Not on File   Home Medications  Prior to Admission medications   Not on File     Critical care time:   CRITICAL CARE Performed by: Britley Gashi D. Harris   Total critical care time: 40 minutes  Critical care time was exclusive of separately billable procedures and treating other patients.  Critical care was necessary to treat or prevent imminent or life-threatening deterioration.  Critical care was time spent personally by me on the following activities: development of treatment plan with patient and/or surrogate as well as nursing, discussions with consultants, evaluation of patient's response to treatment, examination of patient, obtaining history from patient or surrogate, ordering and performing treatments and interventions, ordering and review of laboratory studies, ordering and review of radiographic studies, pulse oximetry and re-evaluation of patient's condition.  Jomo Forand D. Harris, NP-C [facility] Personal contact information can be found on Amion  If no contact or response made please call [phone] 07/29/2024, 6:43 PM

## 2024-07-29 NOTE — Progress Notes (Signed)
 eLink Physician-Brief Progress Note Patient Name: Jacob [name] DOB: Sep 20, 1991 MRN: [id number]   Date of Service  07/29/2024  HPI/Events of Note  28 M brought in after being found unresponsive. Hyperglycemic on EMS arrival, tachycardic and hypothermic as well. Initial glucose >1,200, CO2 <7, K > 7.5, BHB > 8, lactate 7.2, creatinine 4.94.  eICU Interventions  On insulin  and fluids as per DKA protocol. CCM team at bedside.     Intervention Category Evaluation Type: New Patient Evaluation  Damien ONEIDA Grout 07/29/2024, 8:56 PM

## 2024-07-29 NOTE — ED Notes (Signed)
CCM at bedside

## 2024-07-29 NOTE — ED Notes (Addendum)
 Hospitalist aware of pt HR, advised to page pccm, page sent

## 2024-07-29 NOTE — ED Notes (Signed)
Bair hugger turned off.

## 2024-07-29 NOTE — Progress Notes (Signed)
 Pharmacy Antibiotic Note  Jacob [name] is a 33 y.o. male for which pharmacy has been consulted for vancomycin and zosyn dosing for sepsis. Patient presenting with DKA/AMS.  SCr 4.94 - AKI WBC 35.8; LA 7.2; T 98.9; HR 148; RR 14  Plan: Zosyn 2.25 g q6h Vancomycin 1500 mg once - repeat dosing per level until renal function stable Monitor WBC, fever, renal function, cultures De-escalate when able  Height: 5' 9 (175.3 cm) Weight: 65.8 kg (145 lb) IBW/kg (Calculated) : 70.7  Temp (24hrs), Avg:95 F (35 C), Min:94.2 F (34.6 C), Max:95.5 F (35.3 C)  Recent Labs  Lab 07/29/24 1719 07/29/24 1733 07/29/24 1828  WBC 34.1* 35.8*  --   LATICACIDVEN  --   --  7.2*    CrCl cannot be calculated (No successful lab value found.).    Not on File  Microbiology results: Pending  Thank you for allowing pharmacy to be a part of this patient's care.  Dorn Buttner, PharmD, BCPS 07/29/2024 6:51 PM ED Clinical Pharmacist -  [phone]

## 2024-07-29 NOTE — Progress Notes (Signed)
 ED Pharmacy Antibiotic Sign Off An antibiotic consult was received from an ED provider for zosyn per pharmacy dosing for sepsis. A chart review was completed to assess appropriateness.  A single dose of vancomycin 1500 mg was placed by the ED provider.   The following one time order(s) were placed per pharmacy consult:  zosyn 3.375 g x 1 dose  Further antibiotic and/or antibiotic pharmacy consults should be ordered by the admitting provider if indicated.   Thank you for allowing pharmacy to be a part of this patient's care.   Dorn Buttner, PharmD, BCPS 07/29/2024 6:31 PM ED Clinical Pharmacist -  [phone]

## 2024-07-29 NOTE — ED Notes (Signed)
 Attempted to call report to 23M states to call back in 20 minutes.

## 2024-07-29 NOTE — ED Provider Notes (Addendum)
 [place] EMERGENCY DEPARTMENT AT [facility] Provider Note   CSN: [id number] Arrival date & time: 07/29/24  1716     Patient presents with: Hyperglycemia   Jacob [name] is a 33 y.o. male.    Hyperglycemia  Patient is a unknown male with unknown PMH presenting to the ED via EMS with concern for altered mental status, found down with hypotension and hyperglycemia (due to actively high point-of-care glucose in prehospital setting).  Patient minimally responsive upon arrival, unable to corroborate further history.  Of note, in prehospital setting, patient given 500 cc NS for hypotension.  Prior to Admission medications   Not on File    Allergies: Patient has no allergy information on record.    Review of Systems  Updated Vital Signs BP (!) 136/96   Pulse (!) 163   Temp 97.7 F (36.5 C)   Resp 15   Ht 5' 9 (1.753 m)   Wt 65.8 kg   SpO2 100%   BMI 21.41 kg/m   Physical Exam Constitutional:      Appearance: He is ill-appearing and toxic-appearing.  HENT:     Head: Normocephalic and atraumatic.     Nose: Nose normal.     Mouth/Throat:     Mouth: Mucous membranes are dry.  Eyes:     Conjunctiva/sclera: Conjunctivae normal.     Comments: Pupils 4 mm and reactive bilaterally  Cardiovascular:     Rate and Rhythm: Tachycardia present.     Comments: Wide-complex tachycardia Pulmonary:     Breath sounds: Normal breath sounds.  Chest:     Chest wall: No tenderness.  Abdominal:     General: Abdomen is flat.     Palpations: Abdomen is soft.  Skin:    General: Skin is dry.     Capillary Refill: Capillary refill takes more than 3 seconds.     Comments: Cold to touch with delayed capillary refill and cool distal extremities  Neurological:     Mental Status: He is disoriented.     GCS: GCS eye subscore is 2. GCS verbal subscore is 2. GCS motor subscore is 1.     (all labs ordered are listed, but only abnormal results are displayed) Labs Reviewed  CBC  WITH DIFFERENTIAL/PLATELET - Abnormal; Notable for the following components:      Result Value   WBC 34.1 (*)    MCV 108.5 (*)    MCHC 26.2 (*)    Platelets 492 (*)    Neutro Abs 31.7 (*)    Basophils Absolute 0.3 (*)    All other components within normal limits  URINALYSIS, ROUTINE W REFLEX MICROSCOPIC - Abnormal; Notable for the following components:   APPearance HAZY (*)    Glucose, UA >=500 (*)    Hgb urine dipstick SMALL (*)    Ketones, ur 20 (*)    Protein, ur 100 (*)    Bacteria, UA RARE (*)    All other components within normal limits  OSMOLALITY - Abnormal; Notable for the following components:   Osmolality 409 (*)    All other components within normal limits  BASIC METABOLIC PANEL WITH GFR - Abnormal; Notable for the following components:   Sodium 122 (*)    Potassium >7.5 (*)    Chloride 82 (*)    CO2 <7 (*)    Glucose, Bld >1,200 (*)    BUN 48 (*)    Creatinine, Ser 4.94 (*)    Calcium  13.2 (*)    GFR, Estimated  7 (*)    All other components within normal limits  OSMOLALITY - Abnormal; Notable for the following components:   Osmolality 413 (*)    All other components within normal limits  CBC WITH DIFFERENTIAL/PLATELET - Abnormal; Notable for the following components:   WBC 35.8 (*)    MCV 108.2 (*)    MCHC 26.7 (*)    Platelets 468 (*)    All other components within normal limits  CBG MONITORING, ED - Abnormal; Notable for the following components:   Glucose-Capillary >600 (*)    All other components within normal limits  CBG MONITORING, ED - Abnormal; Notable for the following components:   Glucose-Capillary >600 (*)    All other components within normal limits  I-STAT VENOUS BLOOD GAS, ED - Abnormal; Notable for the following components:   pH, Ven 6.906 (*)    pCO2, Ven 18.7 (*)    pO2, Ven 47 (*)    Bicarbonate 3.7 (*)    TCO2 <5 (*)    Acid-base deficit 28.0 (*)    Sodium 117 (*)    Potassium >8.5 (*)    Calcium , Ion 0.98 (*)    All other  components within normal limits  CBG MONITORING, ED - Abnormal; Notable for the following components:   Glucose-Capillary >600 (*)    All other components within normal limits  I-STAT CG4 LACTIC ACID, ED - Abnormal; Notable for the following components:   Lactic Acid, Venous 7.2 (*)    All other components within normal limits  CULTURE, BLOOD (ROUTINE X 2)  CULTURE, BLOOD (ROUTINE X 2)  MRSA NEXT GEN BY PCR, NASAL  BASIC METABOLIC PANEL WITH GFR  BASIC METABOLIC PANEL WITH GFR  BASIC METABOLIC PANEL WITH GFR  BETA-HYDROXYBUTYRIC ACID  BETA-HYDROXYBUTYRIC ACID  BETA-HYDROXYBUTYRIC ACID  BETA-HYDROXYBUTYRIC ACID  ETHANOL  RAPID URINE DRUG SCREEN, HOSP PERFORMED  AMMONIA  CK  BASIC METABOLIC PANEL WITH GFR  BASIC METABOLIC PANEL WITH GFR  BASIC METABOLIC PANEL WITH GFR  PROTIME-INR  BLOOD GAS, ARTERIAL  OSMOLALITY  PHOSPHORUS  MAGNESIUM   I-STAT CHEM 8, ED    EKG: EKG Interpretation Date/Time:  Sunday July 29 2024 18:38:47 EDT Ventricular Rate:  154 PR Interval:  88 QRS Duration:  91 QT Interval:  304 QTC Calculation: 487 R Axis:   103  Text Interpretation: Atrial fibrillation with rapid V-rate Right axis deviation Borderline ST depression, inferior leads Confirmed by Yolande Charleston [phone]) on 07/29/2024 7:11:13 PM  Radiology: DG Chest Portable 1 View Result Date: 07/29/2024 EXAM: 1 VIEW(S) XRAY OF THE CHEST 07/29/2024 05:39:00 PM COMPARISON: None available. CLINICAL HISTORY: ams. AMS; Hyperglycemia FINDINGS: LUNGS AND PLEURA: No focal pulmonary opacity. No pulmonary edema. No pleural effusion. No pneumothorax. HEART AND MEDIASTINUM: No acute abnormality of the cardiac and mediastinal silhouettes. BONES AND SOFT TISSUES: Overlying leads noted. No acute osseous abnormality. IMPRESSION: 1. No acute abnormalities. Electronically signed by: Norman Gatlin MD 07/29/2024 06:02 PM EDT RP Workstation: [id number]     Procedures   Medications Ordered in the ED  lactated  ringers  bolus 2,000 mL (2,000 mLs Intravenous New Bag/Given 07/29/24 1725)  naloxone (NARCAN) 0.4 MG/ML injection (  Not Given 07/29/24 1916)  naloxone [facility]) injection (0.4 mg Intravenous Given 07/29/24 1727)  calcium  chloride injection ( Intravenous Canceled Entry 07/29/24 1730)  insulin  regular, human (MYXREDLIN ) 100 units/ 100 mL infusion (6 Units/hr Intravenous New Bag/Given 07/29/24 1757)  lactated ringers  infusion ( Intravenous Rate/Dose Change 07/29/24 1842)  dextrose  5 % in lactated ringers  infusion (  0 mLs Intravenous Hold 07/29/24 1740)  dextrose  50 % solution 0-50 mL (has no administration in time range)  sodium chloride  0.9 % bolus 2,000 mL (2,000 mLs Intravenous Not Given 07/29/24 1832)  albuterol (VENTOLIN HFA) 108 (90 Base) MCG/ACT inhaler 2 puff (2 puffs Inhalation Not Given 07/29/24 1916)  vancomycin (VANCOREADY) IVPB 1500 mg/300 mL (1,500 mg Intravenous New Bag/Given 07/29/24 1854)  Chlorhexidine  Gluconate Cloth 2 % PADS 6 each (has no administration in time range)  docusate sodium  (COLACE) capsule 100 mg (has no administration in time range)  polyethylene glycol (MIRALAX  / GLYCOLAX ) packet 17 g (has no administration in time range)  thiamine  (VITAMIN B1) 500 mg in sodium chloride  0.9 % 50 mL IVPB (has no administration in time range)  calcium  gluconate inj 10% (1 g) URGENT USE ONLY! (1 g Intravenous Given 07/29/24 1720)  sodium bicarbonate  injection 50 mEq (50 mEq Intravenous Given 07/29/24 1729)  lactated ringers  bolus 2,000 mL (0 mLs Intravenous Stopped 07/29/24 1754)  piperacillin-tazobactam (ZOSYN) IVPB 3.375 g (0 g Intravenous Stopped 07/29/24 1904)  calcium  gluconate inj 10% (1 g) URGENT USE ONLY! (1 g Intravenous Given 07/29/24 1915)    Clinical Course as of 07/29/24 1919  Sun Jul 29, 2024  1732 2 L LR wide open, 2 g calcium  gluconate, 2 g calcium  chloride [WB]  1753 1 mg Narcan administered without symptomatic improvement. [WB]  1753 Placed on end-tidal and SpO2 monitoring as  well as telemetry. [WB]  1753 Wide-complex tachycardia narrowed with rate improvement to 115. [WB]  1754 On reassessment, patient mentation status improving, GCS 3/2/5. [WB]    Clinical Course User Index [WB] Cayci Mcnabb, Elsie, MD                                 Medical Decision Making Amount and/or Complexity of Data Reviewed Labs: ordered. Radiology: ordered.  Risk Prescription drug management. Decision regarding hospitalization.   Undifferentiated male presenting to the ED via EMS with undetectably high hyperglycemia and altered mental status concerning for acute hyperglycemic crisis -HHS/DKA.  Given 500 cc NS and route by EMS.    Upon arrival, patient GCS 2/2/1.  Patient promptly placed on telemetry with end-tidal monitoring.  Glucose undetectably high.  Initial systolic BP 70.  Patient initially with wide-complex variable tachycardia with undetectably high glucose.  Stat potassium 8.5.  Bedside EKG indicative of sinus tachycardia with QRS widening and peaked T waves, concerning for hyperkalemia.  Subsequently given 2 L LR wide open, 2 g calcium  gluconate, 2 g calcium  chloride.  Patient initiated on insulin  gtt. with albuterol. Telemetry significant for rate improvement to 115 and QRS narrowing.  Rectal temperature 35.1.  Labs significant for neutrophil predominant leukocytosis, WBC 34.1.  Hemoconcentration considered, however in the setting of hypothermia, hypotension, and undifferentiated presentation, concern for sepsis/bacteremia.  Patient initiated on broad-spectrum IV Zosyn and vancomycin.  Orders placed with lactate and broad infectious workup.  Patient discussed with ICU team, patient to be admitted to ICU service for ongoing medical resuscitation.  Head CT ordered and pending.   Final diagnoses:  Hyperglycemic crisis due to diabetes mellitus (HCC)  Metabolic encephalopathy  Hyperosmolar hyperglycemic state (HHS) [facility])    ED Discharge Orders     None           Arlethia Basso, Elsie, MD 07/29/24 1827    Dorcus Elsie, MD 07/29/24 1919    Yolande Lamar BROCKS, MD 08/04/24 2125

## 2024-07-30 ENCOUNTER — Telehealth: Payer: Self-pay | Admitting: *Deleted

## 2024-07-30 DIAGNOSIS — E1110 Type 2 diabetes mellitus with ketoacidosis without coma: Secondary | ICD-10-CM

## 2024-07-30 DIAGNOSIS — N179 Acute kidney failure, unspecified: Secondary | ICD-10-CM

## 2024-07-30 DIAGNOSIS — D72829 Elevated white blood cell count, unspecified: Secondary | ICD-10-CM

## 2024-07-30 DIAGNOSIS — G9340 Encephalopathy, unspecified: Secondary | ICD-10-CM

## 2024-07-30 LAB — BASIC METABOLIC PANEL WITH GFR
Anion gap: 17 — ABNORMAL HIGH (ref 5–15)
Anion gap: 14 (ref 5–15)
BUN: 32 mg/dL — ABNORMAL HIGH (ref 6–20)
BUN: 26 mg/dL — ABNORMAL HIGH (ref 6–20)
CO2: 21 mmol/L — ABNORMAL LOW (ref 22–32)
CO2: 29 mmol/L (ref 22–32)
Calcium: 10.9 mg/dL — ABNORMAL HIGH (ref 8.9–10.3)
Calcium: 9.9 mg/dL (ref 8.9–10.3)
Chloride: 111 mmol/L (ref 98–111)
Chloride: 110 mmol/L (ref 98–111)
Creatinine, Ser: 2.5 mg/dL — ABNORMAL HIGH (ref 0.61–1.24)
Creatinine, Ser: 1.78 mg/dL — ABNORMAL HIGH (ref 0.61–1.24)
GFR, Estimated: 34 mL/min — ABNORMAL LOW (ref >60)
GFR, Estimated: 51 mL/min — ABNORMAL LOW (ref >60)
Glucose, Bld: 321 mg/dL — ABNORMAL HIGH (ref 70–99)
Glucose, Bld: 171 mg/dL — ABNORMAL HIGH (ref 70–99)
Potassium: 4.1 mmol/L (ref 3.5–5.1)
Potassium: 3.6 mmol/L (ref 3.5–5.1)
Sodium: 149 mmol/L — ABNORMAL HIGH (ref 135–145)
Sodium: 153 mmol/L — ABNORMAL HIGH (ref 135–145)

## 2024-07-30 LAB — CBC
HCT: 36 % — ABNORMAL LOW (ref 39.0–52.0)
Hemoglobin: 12.4 g/dL — ABNORMAL LOW (ref 13.0–17.0)
MCH: 28.7 pg (ref 26.0–34.0)
MCHC: 34.4 g/dL (ref 30.0–36.0)
MCV: 83.3 fL (ref 80.0–100.0)
Platelets: 334 K/uL (ref 150–400)
RBC: 4.32 MIL/uL (ref 4.22–5.81)
RDW: 13.2 % (ref 11.5–15.5)
WBC: 23.3 K/uL — ABNORMAL HIGH (ref 4.0–10.5)
nRBC: 0 % (ref 0.0–0.2)

## 2024-07-30 LAB — GLUCOSE, CAPILLARY
Glucose-Capillary: 120 mg/dL — ABNORMAL HIGH (ref 70–99)
Glucose-Capillary: 164 mg/dL — ABNORMAL HIGH (ref 70–99)
Glucose-Capillary: 165 mg/dL — ABNORMAL HIGH (ref 70–99)
Glucose-Capillary: 168 mg/dL — ABNORMAL HIGH (ref 70–99)
Glucose-Capillary: 169 mg/dL — ABNORMAL HIGH (ref 70–99)
Glucose-Capillary: 174 mg/dL — ABNORMAL HIGH (ref 70–99)
Glucose-Capillary: 176 mg/dL — ABNORMAL HIGH (ref 70–99)
Glucose-Capillary: 184 mg/dL — ABNORMAL HIGH (ref 70–99)
Glucose-Capillary: 191 mg/dL — ABNORMAL HIGH (ref 70–99)
Glucose-Capillary: 200 mg/dL — ABNORMAL HIGH (ref 70–99)
Glucose-Capillary: 202 mg/dL — ABNORMAL HIGH (ref 70–99)
Glucose-Capillary: 203 mg/dL — ABNORMAL HIGH (ref 70–99)
Glucose-Capillary: 209 mg/dL — ABNORMAL HIGH (ref 70–99)
Glucose-Capillary: 218 mg/dL — ABNORMAL HIGH (ref 70–99)
Glucose-Capillary: 248 mg/dL — ABNORMAL HIGH (ref 70–99)
Glucose-Capillary: 255 mg/dL — ABNORMAL HIGH (ref 70–99)
Glucose-Capillary: 285 mg/dL — ABNORMAL HIGH (ref 70–99)
Glucose-Capillary: 373 mg/dL — ABNORMAL HIGH (ref 70–99)
Glucose-Capillary: 415 mg/dL — ABNORMAL HIGH (ref 70–99)

## 2024-07-30 LAB — MRSA NEXT GEN BY PCR, NASAL: MRSA by PCR Next Gen: NOT DETECTED

## 2024-07-30 LAB — BETA-HYDROXYBUTYRIC ACID
Beta-Hydroxybutyric Acid: 5.05 mmol/L — ABNORMAL HIGH (ref 0.05–0.27)
Beta-Hydroxybutyric Acid: 3.57 mmol/L — ABNORMAL HIGH (ref 0.05–0.27)

## 2024-07-30 LAB — LACTIC ACID, PLASMA: Lactic Acid, Venous: 1.9 mmol/L (ref 0.5–1.9)

## 2024-07-30 MED ORDER — ORAL CARE MOUTH RINSE: 15.0000 mL | OROMUCOSAL | Status: DC | PRN

## 2024-07-30 MED ORDER — LINEZOLID 600 MG PO TABS
600.0000 mg | ORAL_TABLET | Freq: Two times a day (BID) | ORAL | Status: DC
  Administered 2024-07-31 – 2024-08-01 (×6): 600 mg via ORAL
  Filled 2024-07-30 (×5): qty 1

## 2024-07-30 MED ORDER — INSULIN GLARGINE 100 UNIT/ML ~~LOC~~ SOLN
10.0000 [IU] | Freq: Two times a day (BID) | SUBCUTANEOUS | Status: DC
  Filled 2024-07-30 (×2): qty 0.1

## 2024-07-30 MED ORDER — PIPERACILLIN-TAZOBACTAM 3.375 G IVPB
3.3750 g | Freq: Three times a day (TID) | INTRAVENOUS | Status: DC
Start: 2024-07-30 — End: 2024-08-01
  Administered 2024-07-30 – 2024-08-01 (×14): 3.375 g via INTRAVENOUS
  Filled 2024-07-30 (×10): qty 50

## 2024-07-30 MED ORDER — INSULIN ASPART 100 UNIT/ML IJ SOLN
0.0000 [IU] | INTRAMUSCULAR | Status: DC
  Administered 2024-07-30 (×2): 3 [IU] via SUBCUTANEOUS
  Administered 2024-07-31: 1 [IU] via SUBCUTANEOUS
  Administered 2024-07-31: 2 [IU] via SUBCUTANEOUS
  Administered 2024-07-31: 3 [IU] via SUBCUTANEOUS
  Administered 2024-07-31: 2 [IU] via SUBCUTANEOUS
  Administered 2024-07-31: 3 [IU] via SUBCUTANEOUS
  Administered 2024-07-31: 1 [IU] via SUBCUTANEOUS

## 2024-07-30 MED ORDER — INSULIN GLARGINE 100 UNIT/ML ~~LOC~~ SOLN
15.0000 [IU] | Freq: Two times a day (BID) | SUBCUTANEOUS | Status: DC
  Administered 2024-07-30 – 2024-07-31 (×8): 15 [IU] via SUBCUTANEOUS
  Filled 2024-07-30 (×7): qty 0.15

## 2024-07-30 MED ORDER — ENOXAPARIN SODIUM 40 MG/0.4ML IJ SOSY
40.0000 mg | PREFILLED_SYRINGE | INTRAMUSCULAR | Status: DC
  Administered 2024-07-30 – 2024-08-03 (×10): 40 mg via SUBCUTANEOUS
  Filled 2024-07-30 (×5): qty 0.4

## 2024-07-30 MED ORDER — LACTATED RINGERS IV BOLUS
1000.0000 mL | Freq: Once | INTRAVENOUS | Status: AC
Start: 2024-07-30 — End: 2024-07-30
  Administered 2024-07-30 (×2): 1000 mL via INTRAVENOUS

## 2024-07-30 NOTE — Telephone Encounter (Signed)
 I called pt and pt's mother, no answer. Mailbox is full, unable to leave a message.

## 2024-07-30 NOTE — Progress Notes (Signed)
 Prior to patient's foley catheter being removed, patient was seen tugging on catheter. During the catheter removal process, 10 cc of saline was removed from the foley balloon. When removing, some resistance was met, however the balloon was fully deflated. Since removal, patient's urine output has been blood tinged. Suspects this is trauma from foley removal and patient tugging on catheter prior to removal. Dr. Neda notified.

## 2024-07-30 NOTE — Inpatient Diabetes Management (Signed)
 Inpatient Diabetes Program Recommendations  AACE/ADA: New Consensus Statement on Inpatient Glycemic Control (2015)  Target Ranges:  Prepandial:   less than 140 mg/dL      Peak postprandial:   less than 180 mg/dL (1-2 hours)      Critically ill patients:  140 - 180 mg/dL   Lab Results  Component Value Date   GLUCAP 191 (H) 07/30/2024   Diabetes history: DM1 Outpatient Diabetes medications:  Lantus  24 units daily Novolog  7 units tid meal coverage Current orders for Inpatient glycemic control: IV insulin  per Endotool  Inpatient Diabetes Program Recommendations:   Noted patient admitted in DKA. Will speak with patient and follow during hospitalization.  Thank you, Sahil Milner E. Kary Colaizzi, RN, MSN, CNS, CDCES  Diabetes Coordinator Inpatient Glycemic Control Team Team Pager [phone] (8am-5pm) 07/30/2024 10:03 AM

## 2024-07-30 NOTE — Telephone Encounter (Signed)
 Copied from CRM #[id number]. Topic: Appointments - Appointment Cancel/Reschedule >> Jul 30, 2024  9:17 AM Diannia H wrote: Patient/patient representative is calling to cancel or reschedule an appointment. Refer to attachments for appointment information. >> Jul 30, 2024 10:14 AM Miquel SAILOR wrote: Patient went to ER 09/28 due to Blood sugar level issue. Mental issuse Pt is having now and Disability has been denied. Needs call back [phone]

## 2024-07-30 NOTE — TOC CM/SW Note (Signed)
 Transition of Care [facility]) - Inpatient Brief Assessment   Patient Details  Name: Jacob [name] MRN: [id number] Date of Birth: [birth year]  Transition of Care [facility]) CM/SW Contact:    Tom-Johnson, Harvest Muskrat, RN Phone Number: 07/30/2024, 3:43 PM   Clinical Narrative:  Patient presented to the ED after being found down Unresponsive with undetectably high Glucose Level by EMS. Found to be Hypothermic and Tachycardic with decreased Mentation in the ED. Labs showed Potassium 8.5, WBC, 13.8, Platelets 468, pH 6.90, CO2 18 and Bicarb 3.7. Patient is admitted with DKA.  CM spoke with patient's mother, [name] at bedside about needs for post hospital transition. [name] states patient is currently homeless, was staying with his grandmother but with his Mental instability, he is currently homeless. [name] states patient needs to be placed where he can get the assistance he needs for his Mental Health. MD and LCSW notified, awaiting Psych recommendations.   TOC will continue to follow as patient progresses with care towards discharge.           Transition of Care Asessment:

## 2024-07-30 NOTE — Progress Notes (Signed)
 Pharmacy Antibiotic Note  Jacob [name] is a 33 y.o. male for which pharmacy has been consulted for zosyn dosing for sepsis. Patient presenting with DKA/AMS.  Abx were started last PM to r/o sepsis in DKA setting. Plan is to empiric use for about 48 hrs. Due to his AKI, we will change vanc to linezolid empirically after discussing with Dr. Neda. Vanc will last until at least tomorrow. Ok to add lovenox  for DVT PX  Scr down 2.5, Crcl>30 ml/min  Plan: Change zosyn 3.375g IV q8 Dc vanc Linezolid 600mg  PO BID starting 9/30  Height: 5' 9 (175.3 cm) Weight: 54.8 kg (120 lb 13 oz) IBW/kg (Calculated) : 70.7  Temp (24hrs), Avg:98.4 F (36.9 C), Min:94.2 F (34.6 C), Max:99.7 F (37.6 C)  Recent Labs  Lab 07/29/24 1719 07/29/24 1733 07/29/24 1828 07/29/24 2115 07/30/24 0130  WBC 34.1* 35.8*  --   --   --   CREATININE  --  4.94*  --  3.81* 2.50*  LATICACIDVEN  --   --  7.2*  --   --     Estimated Creatinine Clearance: 32.9 mL/min (A) (by C-G formula based on SCr of 2.5 mg/dL (H)).    Not on File  9/28 vanc>> 9/28 zosyn>>   MRSA neg 9/28 blood>>ngtd   Sergio Batch, PharmD, [place], AAHIVP, CPP Infectious Disease Pharmacist 07/30/2024 7:50 AM

## 2024-07-30 NOTE — Discharge Instructions (Addendum)
 Counseling Resources:  [facility] [address], [address]  [place], [place] [address]        [phone]     [facility]  [phone] [address] JEWELL BROCKS  [place], [place] [address] [phone]  [name] [address] [place], [place]  [phone]    Janit Griffins [facility] [address], [place], [place] [phone]  [facility]:  [phone] [place]  [facility]:  [address] [place], [place] [phone]  *[facility] (under & uninsured) [address]   [place] [phone]    [email or web address]    [facility] [place] -[address]     Phone:  [phone] [facility]-  [address]  [phone]    [facility] [address] [place], [place] [phone]  *[facility]: [address] [place], [place], [place]   [phone]   [facility] (Spanish therapist) [address] [place]  [address] [place] [address] [phone]    [facility]  [phone]  [address],  [place], [place]    [place] [phone] [address] [place]   [facility]  [address] [place], [place]        [phone]  Open Access/Walk In [place], [address] [place] [phone]):   Mon - Fri from 8 AM - 3 PM  [facility] the [address], [address] , [place]: [phone]) 8:30 - 12; 1 - 2:30 Accepts Medicaid   [facility] of the [employer], [address] [place], [place] ([phone]):8:30 - 12; 2 - 3PM  [facility] [employer], [address] [place]; [phone]):   Mon - Fri 8 AM - 5 PM  *[facility] [address] [place]  MWF 12:30 to 3:00 or  call to schedule an appointment [phone]  Specific  Provider options Psychology Today  https://www.psychologytoday.com/us  click on find a therapist  enter your zip code left side and select or tailor a therapist for your specific need.   [facility] Provider Directory [email or web address]  (Medicaid)  Follow all drop down to find a provider  Social Support program [facility] [place] [phone] or PhotoSolver.pl [address], [place], [place] Recovery support and educational  In home counseling [facility] Telephone: [phone]  office in [employer] .com   private insurance BCC[place], [place] health Choice, [place], [place], [place], [place], [place] Health Choice   24- Hour Availability:  [facility]  [phone] or [phone]  [facility] of the [facility] [phone]  [facility]  [phone]   [facility]  [phone] (after hours)  Therapeutic Alternative/Mobile Crisis   [phone]  [place]  National Suicide Hotline  513-639-4017 MERRILYN)  Call 911 or go to emergency room  [facility]  [phone]);  [place] and [employer]  [phone]); [place], [place], [place], [place], [place], [place], [place]   Group Homes  [facility] [place] [facility] [place] (108) [facility] [place] [facility] [place], [place] SITE: [address], [place], [place], [address] [phone] Fax: [phone] Star Rating Certificate # of Stars: N/A [facility] Inpatient[address], [facility] SITE: [address] [place], [place], [address] [phone] Fax: [phone] HAL-041-090 Star Rating Certificate #  of Stars: [address] ([address] [facility] SITE: [address] [place], [place], [address] [phone] Fax: [phone] HAL-041-089 Star Rating Certificate # of Stars: 3 [facility] [place]  [phone]) [address] [place], [place] SITE: [address] [place], [place], [address] [phone] Fax: [phone] HAL-041-088 Star Rating Certificate # of Stars: 4 [facility] [place] (125) [facility] SITE: [address] [place], [place], [address] [phone] Fax: [phone] Star Rating Certificate # of Stars: 4 [facility] Operations, [facility] SITE: [address], [place], [place], [address] [phone] Fax: [phone] HAL-[phone] Star Rating Certificate # of Stars: 3 [facility] at [place] (48) [facility] SITE: [address], [place], [place], [address] [phone] Fax: [phone] Star Rating Certificate # of Stars: 4 [facility] [place] of [place] ([address]) [address] [place], [place] SITE: [address] [place], [place], [address] [phone] Fax: [phone] HAL-041-082 Star Rating Certificate # of Stars: 4 [place] at [facility] (45) [address] [place], [place] SITE: [address] [place], [place], [address] [phone] Fax: [phone] HAL-041-079 Star Rating Certificate # of Stars: 4 [facility] [phone] [place], [place] SITE: [address], [place], [place], [address] [phone] Fax: [phone] HAL-041-078 Star Rating Certificate # of Stars: 4 [facility] (60) [place], [place] SITE: [address], [place], [place], [address] [phone] Fax: [phone] HAL-041-077 Star Rating Certificate # of Stars: 4 [place] ([address] [facility] [place], [place] SITE: [address] [place], [place], [address] [phone] Fax: [phone] HAL-041-072 Star Rating Certificate # of Stars: 3 [facility] [place] [phone]) [facility] SITE: [address], [place], [place], [address] [phone] Fax: [phone] HAL-041-062 Star Rating Certificate # of Stars: 4 [place] at  [facility] (28) [facility] SITE: [address], [place], [place], [address] [phone] Fax: [phone] HAL-041-060 Star Rating Certificate # of Stars: 4 [facility] (30) [facility] SITE: [address] [place], [place], [address] [phone] Fax: [phone] Star Rating Certificate # of Stars: 4 [facility] [place] ([place]) (102) [facility], [facility] SITE: [address], [place], [place], [address] [phone] Fax: [phone] Star Rating Certificate # of Stars: 3 [facility] [place] (65) [facility], [facility] SITE: [address] [place], [place], [address] [phone] Fax: [phone] HAL-[phone] Star Rating Certificate # of Stars: 4 [facility] [phone]) [facility], [place] SITE: [address], [place], [place], [address] [phone] Fax: [phone] HAL-041-031 Star Rating Certificate # of Stars: 4 [facility] [place] [phone]) [facility], [facility] SITE: [address], [place], [place], [address] [phone] Fax: [phone] Star Rating Certificate # of Stars: 3 [facility] [facility] (79) [facility], [facility] SITE: [address], [place], [place], [address] [phone] Fax: [phone] HAL-041-029 Star Rating Certificate # of Stars: 3 [facility] (18) [facility] SITE: [address], [place], [place], [address] [phone] Fax: [phone] HAL-041-015 Star Rating Certificate # of Stars: 4 [facility] [phone]) Consuelo Sharps and [place] SITE: [address], [place], [place], [address] [phone] Fax: [phone] HAL-041-010 Star Rating Certificate # of Stars: 2  Inpatient Psych Care  [facility] - [address], Must go through emergency department  [facility]  [place], [place]) [phone]  Old [address] - [address] [place], [phone]  [facility] - [address] [phone]

## 2024-07-30 NOTE — TOC Progression Note (Addendum)
 Transition of Care [facility]) - Progression Note    Patient Details  Name: Jacob [name] MRN: [id number] Date of Birth: 1991-06-05  Transition of Care [facility]) CM/SW Contact  Lendia Dais, [place] Phone Number: 07/30/2024, 3:52 PM  Clinical Narrative:  Pt is disoriented x4 and CSW spoke to pt's mother [name] at bedside.  [name] inquired about social services and asked if she could be helped with that here. CSW stated that they help with discharge planning. [name] stated understanding.  [name] asked about inpatient psych services for the patient. CSW explained that the MD would need to do a psych consult for the psych MD to evaluate the patient and to see what recommendations they have. CSW explained in order for the patient to placed at a inpatient psych facility, the psych MD would have to recommend it. [name] stated understanding and requested for inpatient/outpatient pysch and group home resources.   CSW will continue to monitor.                       Expected Discharge Plan and Services                                               Social Drivers of Health (SDOH) Interventions    Readmission Risk Interventions     No data to display

## 2024-07-30 NOTE — Plan of Care (Signed)
  Problem: Clinical Measurements: Goal: Respiratory complications will improve Outcome: Progressing   Problem: Elimination: Goal: Will not experience complications related to urinary retention Outcome: Progressing   Problem: Safety: Goal: Ability to remain free from injury will improve Outcome: Progressing   Problem: Safety: Goal: Non-violent Restraint(s) Outcome: Not Progressing   Problem: Education: Goal: Knowledge of General Education information will improve Description: Including pain rating scale, medication(s)/side effects and non-pharmacologic comfort measures Outcome: Not Progressing   Problem: Health Behavior/Discharge Planning: Goal: Ability to manage health-related needs will improve Outcome: Not Progressing   Problem: Clinical Measurements: Goal: Ability to maintain clinical measurements within normal limits will improve Outcome: Not Progressing Goal: Will remain free from infection Outcome: Not Progressing Goal: Diagnostic test results will improve Outcome: Not Progressing Goal: Cardiovascular complication will be avoided Outcome: Not Progressing   Problem: Activity: Goal: Risk for activity intolerance will decrease Outcome: Not Progressing   Problem: Nutrition: Goal: Adequate nutrition will be maintained Outcome: Not Progressing   Problem: Coping: Goal: Level of anxiety will decrease Outcome: Not Progressing   Problem: Elimination: Goal: Will not experience complications related to bowel motility Outcome: Not Progressing   Problem: Pain Managment: Goal: General experience of comfort will improve and/or be controlled Outcome: Not Progressing   Problem: Skin Integrity: Goal: Risk for impaired skin integrity will decrease Outcome: Not Progressing

## 2024-07-30 NOTE — Plan of Care (Signed)
   Problem: Safety: Goal: Non-violent Restraint(s) Outcome: Not Progressing   Problem: Education: Goal: Knowledge of General Education information will improve Description: Including pain rating scale, medication(s)/side effects and non-pharmacologic comfort measures Outcome: Not Progressing   Problem: Health Behavior/Discharge Planning: Goal: Ability to manage health-related needs will improve Outcome: Not Progressing   Problem: Clinical Measurements: Goal: Ability to maintain clinical measurements within normal limits will improve Outcome: Not Progressing Goal: Will remain free from infection Outcome: Not Progressing Goal: Diagnostic test results will improve Outcome: Not Progressing Goal: Respiratory complications will improve Outcome: Not Progressing Goal: Cardiovascular complication will be avoided Outcome: Not Progressing   Problem: Activity: Goal: Risk for activity intolerance will decrease Outcome: Not Progressing   Problem: Nutrition: Goal: Adequate nutrition will be maintained Outcome: Not Progressing   Problem: Coping: Goal: Level of anxiety will decrease Outcome: Not Progressing   Problem: Elimination: Goal: Will not experience complications related to bowel motility Outcome: Not Progressing Goal: Will not experience complications related to urinary retention Outcome: Not Progressing   Problem: Pain Managment: Goal: General experience of comfort will improve and/or be controlled Outcome: Not Progressing   Problem: Safety: Goal: Ability to remain free from injury will improve Outcome: Not Progressing   Problem: Skin Integrity: Goal: Risk for impaired skin integrity will decrease Outcome: Not Progressing

## 2024-07-30 NOTE — Progress Notes (Signed)
 NAME:  Jacob [name], MRN:  [id number], DOB:  January 04, 1991, LOS: 1 ADMISSION DATE:  07/29/2024, CONSULTATION DATE:  07/27/2024 REFERRING MD:  ED, CHIEF COMPLAINT: Unresponsive/DKA  History of Present Illness:  Patient is a unknown male of unknown age who was found down by EMS unresponsive with undetectably high glucose per EMS.  On ED arrival patient was seen hypothermic and tachycardic with decreased mentation.  Preliminary blood work concerning for HHS versus DKA with pH 6.90 CO2 18, O2 47 and bicarb 3.7 full chemistry pending at time of evaluation but i-STAT potassium critically high at 8.5.  CBC also significant for WBC 13.8 and platelets 468.  Critical care consulted for further management admission..  Pertinent  Medical History  Unkown  Significant Hospital Events: Including procedures, antibiotic start and stop dates in addition to other pertinent events   9/28 found unresponsive with critically high glucose workup on admission appears consistent with DKA  Interim History / Subjective:  No overnight events  Objective    Blood pressure (!) 133/97, pulse (!) 101, temperature 98.4 F (36.9 C), resp. rate 15, height 5' 9 (1.753 m), weight 54.8 kg, SpO2 95%.        Intake/Output Summary (Last 24 hours) at 07/30/2024 0745 Last data filed at 07/30/2024 0600 Gross per 24 hour  Intake 7605.33 ml  Output 4850 ml  Net 2755.33 ml   Filed Weights   07/29/24 1745 07/29/24 2008 07/30/24 0500  Weight: 65.8 kg 53.1 kg 54.8 kg    Examination: General: Acutely ill-appearing, awake, responsive HEENT: Dry oral mucosa Neuro: Answering questions, take some time to respond CV: S1-S2 appreciated PULM: Clear breath sounds bilaterally GI: Soft, bowel sounds appreciated Extremities: warm/dry, no edema  Skin: no rashes or lesions  I reviewed last 24 h vitals and pain scores, last 48 h intake and output, last 24 h labs and trends, and last 24 h imaging results. Sodium 149, CO2 of 21, BUN 32,  creatinine 2.50 Beta hydroxybutyric acid 5.05 Anion gap 17 Chest x-ray reviewed by myself with no acute abnormality  Resolved problem list   Assessment and Plan   33 year old with unclear medical history was admitted with what appears to be DKA - On insulin  - Appears to be stabilizing - Still dry  Encephalopathy secondary to DKA - Maintain neuroprotective measures - Aspiration precautions  DKA - On Endo tool - Continue CBG monitoring - N.p.o. status - Initial pH of 6.9, bicarb of 10, beta hydroxybutyrate acid of greater than 8  Leukocytosis with lactic acidosis - Empirically on antibiotics - Switch from vancomycin - Follow cultures  Acute kidney injury - Continue to replete fluids - Avoid nephrotoxic medications - Ensure renal perfusion  Give another bolus of lactated ringer  DVT prophylaxis Continue empiric antibiotics, switch from vancomycin to linezolid Continue to follow cultures  Labs   CBC: Recent Labs  Lab 07/29/24 1719 07/29/24 1724 07/29/24 1733 07/29/24 2118  WBC 34.1*  --  35.8*  --   NEUTROABS 31.7*  --  DUPLICATE REQUEST  SEE [id number]  --   HGB 13.1 16.0 13.0 13.3  HCT 50.0 47.0 48.7 39.0  MCV 108.5*  --  108.2*  --   PLT 492*  --  468*  --     Basic Metabolic Panel: Recent Labs  Lab 07/29/24 1724 07/29/24 1733 07/29/24 2115 07/29/24 2118 07/30/24 0130  NA 117* 122* 142 139 149*  K >8.5* >7.5* 4.7 4.5 4.1  CL  --  82* 104  --  111  CO2  --  <7* 10*  --  21*  GLUCOSE  --  >1,200* 710*  --  321*  BUN  --  48* 39*  --  32*  CREATININE  --  4.94* 3.81*  --  2.50*  CALCIUM   --  13.2* 11.6*  --  10.9*  MG  --   --  3.1*  --   --   PHOS  --   --  4.9*  --   --    GFR: Estimated Creatinine Clearance: 32.9 mL/min (A) (by C-G formula based on SCr of 2.5 mg/dL (H)). Recent Labs  Lab 07/29/24 1719 07/29/24 1733 07/29/24 1828  WBC 34.1* 35.8*  --   LATICACIDVEN  --   --  7.2*    Liver Function Tests: No results for input(s): AST,  ALT, ALKPHOS, BILITOT, PROT, ALBUMIN in the last 168 hours. No results for input(s): LIPASE, AMYLASE in the last 168 hours. Recent Labs  Lab 07/29/24 2115  AMMONIA 48*    ABG    Component Value Date/Time   PHART 7.251 (L) 07/29/2024 2118   PCO2ART 21.4 (L) 07/29/2024 2118   PO2ART 170 (H) 07/29/2024 2118   HCO3 9.4 (L) 07/29/2024 2118   TCO2 10 (L) 07/29/2024 2118   ACIDBASEDEF 16.0 (H) 07/29/2024 2118   O2SAT 99 07/29/2024 2118     Coagulation Profile: Recent Labs  Lab 07/29/24 2115  INR 1.0    Cardiac Enzymes: Recent Labs  Lab 07/29/24 1719  CKTOTAL 145    HbA1C: No results found for: HGBA1C  CBG: Recent Labs  Lab 07/30/24 0246 07/30/24 0346 07/30/24 0447 07/30/24 0547 07/30/24 0645  GLUCAP 255* 200* 169* 209* 184*    Review of Systems:   Unable to assess   Past Medical History:  He,  has a past medical history of Diabetes mellitus without complication (HCC).   Surgical History:  History reviewed. No pertinent surgical history.   Social History:      Family History:  His family history is not on file.   Allergies Not on File   Home Medications  Prior to Admission medications   Not on File    The patient is critically ill with multiple organ systems failure and requires high complexity decision making for assessment and support, frequent evaluation and titration of therapies, application of advanced monitoring technologies and extensive interpretation of multiple databases. Critical Care Time devoted to patient care services described in this note independent of APP/resident time (if applicable)  is 32 minutes.   Jennet Epley MD [facility] Personal pager: See Amion If unanswered, please page CCM On-call: [phone]

## 2024-07-31 ENCOUNTER — Encounter: Payer: MEDICAID | Admitting: Student

## 2024-07-31 ENCOUNTER — Encounter: Payer: Self-pay | Admitting: Dietician

## 2024-07-31 DIAGNOSIS — E1310 Other specified diabetes mellitus with ketoacidosis without coma: Secondary | ICD-10-CM | POA: Diagnosis not present

## 2024-07-31 LAB — CBC WITH DIFFERENTIAL/PLATELET
Abs Immature Granulocytes: 0.08 K/uL — ABNORMAL HIGH (ref 0.00–0.07)
Basophils Absolute: 0 K/uL (ref 0.0–0.1)
Basophils Relative: 0 %
Eosinophils Absolute: 0 K/uL (ref 0.0–0.5)
Eosinophils Relative: 0 %
HCT: 32.6 % — ABNORMAL LOW (ref 39.0–52.0)
Hemoglobin: 11.1 g/dL — ABNORMAL LOW (ref 13.0–17.0)
Immature Granulocytes: 1 %
Lymphocytes Relative: 12 %
Lymphs Abs: 1.8 K/uL (ref 0.7–4.0)
MCH: 28.4 pg (ref 26.0–34.0)
MCHC: 34 g/dL (ref 30.0–36.0)
MCV: 83.4 fL (ref 80.0–100.0)
Monocytes Absolute: 0.7 K/uL (ref 0.1–1.0)
Monocytes Relative: 5 %
Neutro Abs: 13 K/uL — ABNORMAL HIGH (ref 1.7–7.7)
Neutrophils Relative %: 82 %
Platelets: 271 K/uL (ref 150–400)
RBC: 3.91 MIL/uL — ABNORMAL LOW (ref 4.22–5.81)
RDW: 13.7 % (ref 11.5–15.5)
WBC: 15.6 K/uL — ABNORMAL HIGH (ref 4.0–10.5)
nRBC: 0 % (ref 0.0–0.2)

## 2024-07-31 LAB — GLUCOSE, CAPILLARY
Glucose-Capillary: 126 mg/dL — ABNORMAL HIGH (ref 70–99)
Glucose-Capillary: 152 mg/dL — ABNORMAL HIGH (ref 70–99)
Glucose-Capillary: 209 mg/dL — ABNORMAL HIGH (ref 70–99)
Glucose-Capillary: 213 mg/dL — ABNORMAL HIGH (ref 70–99)
Glucose-Capillary: 243 mg/dL — ABNORMAL HIGH (ref 70–99)
Glucose-Capillary: 314 mg/dL — ABNORMAL HIGH (ref 70–99)
Glucose-Capillary: 333 mg/dL — ABNORMAL HIGH (ref 70–99)

## 2024-07-31 LAB — COMPREHENSIVE METABOLIC PANEL WITH GFR
ALT: 18 U/L (ref 0–44)
AST: 25 U/L (ref 15–41)
Albumin: 3 g/dL — ABNORMAL LOW (ref 3.5–5.0)
Alkaline Phosphatase: 54 U/L (ref 38–126)
Anion gap: 11 (ref 5–15)
BUN: 18 mg/dL (ref 6–20)
CO2: 33 mmol/L — ABNORMAL HIGH (ref 22–32)
Calcium: 9.4 mg/dL (ref 8.9–10.3)
Chloride: 108 mmol/L (ref 98–111)
Creatinine, Ser: 1.28 mg/dL — ABNORMAL HIGH (ref 0.61–1.24)
GFR, Estimated: >60 mL/min (ref >60)
Glucose, Bld: 113 mg/dL — ABNORMAL HIGH (ref 70–99)
Potassium: 2.9 mmol/L — ABNORMAL LOW (ref 3.5–5.1)
Sodium: 152 mmol/L — ABNORMAL HIGH (ref 135–145)
Total Bilirubin: 0.9 mg/dL (ref 0.0–1.2)
Total Protein: 5.5 g/dL — ABNORMAL LOW (ref 6.5–8.1)

## 2024-07-31 LAB — MAGNESIUM: Magnesium: 1.9 mg/dL (ref 1.7–2.4)

## 2024-07-31 MED ORDER — INSULIN ASPART 100 UNIT/ML IJ SOLN
0.0000 [IU] | Freq: Three times a day (TID) | INTRAMUSCULAR | Status: DC
  Administered 2024-07-31: 15 [IU] via SUBCUTANEOUS
  Administered 2024-07-31 (×2): 7 [IU] via SUBCUTANEOUS
  Administered 2024-07-31: 15 [IU] via SUBCUTANEOUS
  Administered 2024-08-01 (×2): 20 [IU] via SUBCUTANEOUS
  Administered 2024-08-02: 7 [IU] via SUBCUTANEOUS
  Administered 2024-08-02: 20 [IU] via SUBCUTANEOUS
  Administered 2024-08-02: 11 [IU] via SUBCUTANEOUS
  Administered 2024-08-02: 20 [IU] via SUBCUTANEOUS
  Administered 2024-08-02: 7 [IU] via SUBCUTANEOUS
  Administered 2024-08-02 – 2024-08-03 (×2): 11 [IU] via SUBCUTANEOUS
  Administered 2024-08-03: 15 [IU] via SUBCUTANEOUS
  Administered 2024-08-03: 11 [IU] via SUBCUTANEOUS
  Administered 2024-08-03: 15 [IU] via SUBCUTANEOUS

## 2024-07-31 MED ORDER — DIPHENHYDRAMINE HCL 50 MG/ML IJ SOLN: 50.0000 mg | Freq: Three times a day (TID) | INTRAMUSCULAR | Status: DC | PRN

## 2024-07-31 MED ORDER — LORAZEPAM 2 MG/ML IJ SOLN: 2.0000 mg | Freq: Three times a day (TID) | INTRAMUSCULAR | Status: DC | PRN

## 2024-07-31 MED ORDER — DIPHENHYDRAMINE HCL 25 MG PO CAPS
50.0000 mg | ORAL_CAPSULE | Freq: Three times a day (TID) | ORAL | Status: DC | PRN
  Administered 2024-07-31 (×2): 50 mg via ORAL
  Filled 2024-07-31: qty 2

## 2024-07-31 MED ORDER — POTASSIUM CHLORIDE CRYS ER 20 MEQ PO TBCR
40.0000 meq | EXTENDED_RELEASE_TABLET | ORAL | Status: AC
  Administered 2024-07-31 (×6): 40 meq via ORAL
  Filled 2024-07-31 (×3): qty 2

## 2024-07-31 MED ORDER — HALOPERIDOL 5 MG PO TABS: 5.0000 mg | ORAL_TABLET | Freq: Three times a day (TID) | ORAL | Status: DC | PRN

## 2024-07-31 MED ORDER — INSULIN ASPART 100 UNIT/ML IJ SOLN
0.0000 [IU] | Freq: Every day | INTRAMUSCULAR | Status: DC
  Administered 2024-07-31 (×2): 2 [IU] via SUBCUTANEOUS
  Administered 2024-08-02 (×2): 4 [IU] via SUBCUTANEOUS

## 2024-07-31 MED ORDER — HALOPERIDOL LACTATE 5 MG/ML IJ SOLN: 5.0000 mg | Freq: Three times a day (TID) | INTRAMUSCULAR | Status: DC | PRN

## 2024-07-31 MED ORDER — NICOTINE 21 MG/24HR TD PT24
21.0000 mg | MEDICATED_PATCH | Freq: Every day | TRANSDERMAL | Status: DC
  Administered 2024-07-31 – 2024-08-03 (×8): 21 mg via TRANSDERMAL
  Filled 2024-07-31 (×4): qty 1

## 2024-07-31 NOTE — Progress Notes (Signed)
 eLink Physician-Brief Progress Note Patient Name: Maximiano Lott DOB: 1990/12/30 MRN: [id number]   Date of Service  07/31/2024  HPI/Events of Note  Patient transferred out to Progressive Unit. RN requesting Restraint order to be modified to include posey belt with bilateral wrist restraints. Patient is  too fast and strong when trying to get out of bed.  Do not have camera to room  Discussed with RN. Schizophrenia Off of DKA protocol. Q4 hr acucheck.   eICU Interventions  Ordered, add on soft waist belt. AM team to re assess restraints. Need for anti psychotic meds.      Intervention Category Minor Interventions: Agitation / anxiety - evaluation and management  Jodelle ONEIDA Hutching 07/31/2024, 4:10 AM

## 2024-07-31 NOTE — Consult Note (Signed)
 [facility] New Face-to-Face Psychiatric Evaluation  Service Date: July 31, 2024 LOS:  LOS: 2 days    Primary Psychiatric Diagnosis   Altered Mental status  Cocaine Use  Cannabis Use    Assessment  Jacob [name] is a 33 y.o. male admitted medically for 07/29/2024  5:16 PM for altered mental status in the setting of DKA. He carries the psychiatric diagnoses of schizoaffective disorder, bipolar type and has a past medical history of Type 1 Diabetes and Vitamin D  Deficiency.Psychiatry was consulted for optimization of medications and long term management.    His current presentation of increased somnolence is suspected to be due to hyperglycemia vs withdrawal from cocaine positive on admission urine drug screen. Patient unable to engage in exam given intermittent sedation and difficulty staying awake.  Patient confirms that he has taken Haldol  injection for maintenance therapy of schizophrenia.  Denies any current auditory, visual hallucinations or additional psychotic symptoms. Patient is currently in restraints and reports that he was placed in due to a fall earlier. ACTT Team Child psychotherapist and mother, state that he has been disorganized, bizaare and they suspect he may have been responding to internal stimuli as most recently as 1 month ago. The past 3 weeks he has been homeless. Given positive UDS will continue to consider substances role in psychosis vs decompensation of schizophrenia spectrum disorder.  We will continue to follow the patient and assess need for inpatient psychiatric hospitalization versus discharge from a psychiatric standpoint with follow-up with his ACT team.   Please see plan below for detailed recommendations.   Diagnoses:  Active Hospital problems: Principal Problem:   DKA (diabetic ketoacidosis) (HCC)   Plan  ## Safety and Observation Level:  - Based on my clinical evaluation, I estimate the patient to be at low risk of self harm in the current  setting - At this time, we recommend a routine level of observation. This decision is based on my review of the chart including patient's history and current presentation, interview of the patient, mental status examination, and consideration of suicide risk including evaluating suicidal ideation, plan, intent, suicidal or self-harm behaviors, risk factors, and protective factors. This judgment is based on our ability to directly address suicide risk, implement suicide prevention strategies and develop a safety plan while the patient is in the clinical setting. Please contact our team if there is a concern that risk level has changed.  ## Medications:  -- Home regimen: Haldol  Dec 100 mg q28days, last dispensed on 07/11/2024 -- will continue to assess while inpatient and consider adjustments as needed  -- Agitation Protocol:   Mild Haldol  5 mg and Benadryl  50 mg PO TID prn   Moderate Haldol  5 mg and Benadryl  50 mg IM TID prn   ## Medical Decision Making Capacity:  -- Not specifically addressed during assessment   ## Further Work-up:  -- Per primary team  -- most recent EKG on 07/29/2024 had QtC of 464 -- Pertinent labwork reviewed earlier this admission includes: BMP, CBGs,   ## Disposition:  -- TBD, at the current moment can't appropriately assess the patient given somnolence.  Will continue to assess patient for need for inpatient psychiatric hospitalization versus discharge with close follow-up with ACTT Team  -- If to discharge, contact [facility] ACTT Team at [phone]  to follow-up appropriately  ## Behavioral / Environmental:  -- Currently in soft restraints for falling  -- Recommend re-assessing need for restraints   ##Legal Status -- Unaware  of any current legal issues  Thank you for this consult request. Recommendations have been communicated to the primary team.  We will follow at this time.   Jacob OLDEN, MD   NEW History  Relevant Aspects of Hospital Course:   Admitted on 07/29/2024 for altered mental status in the setting of hyperglycemia concerning for DKA.  Patient Report:   Was interviewed in his room at bedside with restraints on.  Patient is intermittently somnolent during exam and unable to stay fully awake and engaged to answer questioning.  Patient was able to state that he does get an Haldol  injection to help with schizophrenia/bipolar disorder.  He denies suicidal ideations, homicidal ideation or auditory visual hallucinations.  He states that his current regimen has been helpful and controlling his psychotic symptoms.  Patient intermittently became irritable and requested questioning on a different time when less sleepy.  Patient consented to speaking with mother for collateral information.  ROS:  Per HPI  Collateral information:  [name], mother, [phone]  She been intermittently homeless the last 2-3 years. She notes schizophrenia and bipolar was diagnosed at that time. She notes that he is currently being followed by ACTT services and they are attempting to help him get an apartment. She reports that he get his shot administered at [facility] earlier this month. She suspects at times he may be attempting to talk to people who are not there last week during a car ride with her. She notes mood lability and will be verbally aggressive with her at times. She reports that he has been losing his possessions (cell phone). He has been rejected by [employer], they are attempting to do a standard determining of health through [facility] (although he has been denied by 2 times in the past) and is working [employer] specialist to assist with housing. He has no SSI or SSDI. Mother reports that the social worker has arranged an appointment in October and November to try and gain financial assistance. She reports that he made SI statements about wanting to jump off a bridge a few weeks ago after going to ED.   Madeline, Social Work ACTT,  [phone]  She  is concerned about his homelessness. She reports that he is currently disoriented to person, place, situation and location and doesn't recognize. She reports difficulty managing his diabetes given his schizophrenia. She reports that he has been out in the elements within the last 3 weeks. She states that he was mumbling to himself and could have been RIS 1 month ago.   Psychiatric History:  Information collected from EMR  The patient has a history of multiple inpatient psychiatric hospitalizations, with the most recent admission occurring from April 3 to February 09 2024, at the [facility]. He reports a past suicide attempt approximately five to six years ago, during which he ingested rat poison.   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 He reports that he is currently unemployed but has been approved for disability benefits for mental health and receives approximately $280 per month in EBT assistance. He identifies no one as a support system, states his religion is Christian-affiliated, and reports basketball as a hobby. He identifies as straight and male, denies any military background   Tobacco use: UTA  Alcohol use: UTA  Drug use: Cocaine and THC, positive on UDS  Family History: per EMR  Family history of substance abuse, stating his mother was addicted to crack cocaine.  Family psych history: Unaware,   The patient's family history is not on file.  Medical History: Past Medical History:  Diagnosis Date   Diabetes mellitus without complication (HCC)     Surgical History: History reviewed. No pertinent surgical history.  Medications:   Current Facility-Administered Medications:    Chlorhexidine  Gluconate Cloth 2 % PADS 6 each, 6 each, Topical, Daily, Harris, Whitney D, NP, 6 each at  07/31/24 9057   dextrose  50 % solution 0-50 mL, 0-50 mL, Intravenous, PRN, Beam, Elsie, MD   haloperidol  (HALDOL ) tablet 5 mg, 5 mg, Oral, TID PRN **AND** diphenhydrAMINE  (BENADRYL ) capsule 50 mg, 50 mg, Oral, TID PRN, Lenard Calin, MD   diphenhydrAMINE  (BENADRYL ) injection 50 mg, 50 mg, Intramuscular, TID PRN **AND** haloperidol  lactate (HALDOL ) injection 5 mg, 5 mg, Intramuscular, TID PRN **AND** LORazepam  (ATIVAN ) injection 2 mg, 2 mg, Intramuscular, TID PRN, Lenard Calin, MD   docusate sodium  (COLACE) capsule 100 mg, 100 mg, Oral, BID PRN, Harris, Whitney D, NP   enoxaparin  (LOVENOX ) injection 40 mg, 40 mg, Subcutaneous, Q24H, Pham, Minh Q, [name]-CPP, 40 mg at 07/31/24 9057   insulin  aspart (novoLOG ) injection 0-20 Units, 0-20 Units, Subcutaneous, TID WC, Pahwani, Fredia, MD, 15 Units at 07/31/24 1203   insulin  aspart (novoLOG ) injection 0-5 Units, 0-5 Units, Subcutaneous, QHS, Pahwani, Ravi, MD   insulin  glargine (LANTUS ) injection 15 Units, 15 Units, Subcutaneous, BID, Olalere, Adewale A, MD, 15 Units at 07/31/24 0942   linezolid (ZYVOX) tablet 600 mg, 600 mg, Oral, BID, Pham, Minh Q, [name]-CPP, 600 mg at 07/31/24 9057   Oral care mouth rinse, 15 mL, Mouth Rinse, PRN, Maree Harder, MD   piperacillin-tazobactam (ZOSYN) IVPB 3.375 g, 3.375 g, Intravenous, Q8H, Pham, Minh Q, [name]-CPP, Last Rate: 12.5 mL/hr at 07/31/24 1204, 3.375 g at 07/31/24 1204   polyethylene glycol (MIRALAX  / GLYCOLAX ) packet 17 g, 17 g, Oral, Daily PRN, Harris, Whitney D, NP   potassium chloride  SA (KLOR-CON  M) CR tablet 40 mEq, 40 mEq, Oral, Q4H, Pahwani, Ravi, MD, 40 mEq at 07/31/24 1018   thiamine  (VITAMIN B1) 500 mg in sodium chloride  0.9 % 50 mL IVPB, 500 mg, Intravenous, TID, Arloa Folks D, NP, Last Rate: 110 mL/hr at 07/31/24 0948, 500 mg at 07/31/24 0948  Allergies: No Known Allergies     Objective  Vital signs:  Temp:  [98 F (36.7 C)-99.5 F (37.5 C)] 98.5 F (36.9 C) (09/30 1100) Pulse Rate:   [64-102] 76 (09/30 1100) Resp:  [10-20] 14 (09/30 0810) BP: (128-144)/(82-103) 143/103 (09/30 0810) SpO2:  [99 %-100 %] 100 % (09/30 0810) Weight:  [54.7 kg] 54.7 kg (09/30 0500)  Psychiatric Specialty Exam:  Presentation  General Appearance: Casual; Appropriate for Environment  Eye Contact:Minimal  Speech:Slow  Speech Volume:Decreased  Handedness:No data recorded  Mood and Affect  Mood:Irritable  Affect:Congruent   Thought Process  Thought Processes:Other (comment) (intermittently somnolent)  Descriptions of Associations:Intact  Orientation:Partial  Thought Content:Logical  History of Schizophrenia/Schizoaffective disorder:Yes  Duration of Psychotic Symptoms:Unable to determine  Hallucinations:Hallucinations: None  Ideas of Reference:None  Suicidal Thoughts:Suicidal Thoughts: No  Homicidal Thoughts:Homicidal Thoughts: No   Sensorium  Memory:Immediate Fair; Recent Poor  Judgment:Impaired  Insight:Shallow   Executive Functions  Concentration:Poor  Attention Span:Poor  Recall:Poor  Fund of Knowledge:Fair  Language:Fair   Psychomotor Activity  Psychomotor Activity:Psychomotor Activity: Decreased; Other (comment) (intermittently somnolent)   Assets  Assets:Resilience   Sleep  Sleep:Sleep: -- (UTA)    Physical Exam: Physical Exam Constitutional:      Appearance: He is ill-appearing. He is not diaphoretic.  Pulmonary:     Effort: Pulmonary effort is normal.    Review of Systems  Psychiatric/Behavioral:  Negative for depression, hallucinations and suicidal ideas. The patient is not nervous/anxious and does not have insomnia.    Blood pressure (!) 143/103, pulse 76, temperature 98.5 F (36.9 C), temperature source Oral, resp. rate 14, height 5' 9 (1.753 m), weight 54.7 kg, SpO2 100%. Body mass index is 17.81 kg/m.

## 2024-07-31 NOTE — Progress Notes (Addendum)
 Bedside report completed and care assumed. PT is confused and cooperative. RN repositioned Pt while in restraints and completing restraints assessments. RN will round on Pt for safety and observation. Bed alarms engaged and fall mats on the floor.    Rounding on Pt completed hourly, Pt had no acute distress or events overnight. Pt is more alert and oriented to self, place, situation, and he knows the month. Pt is cooperating and is calm. Restraints orders ended, and restraints removed. RN will continue to monitor.

## 2024-07-31 NOTE — Plan of Care (Signed)
  Problem: Clinical Measurements: Goal: Will remain free from infection Outcome: Progressing Goal: Diagnostic test results will improve Outcome: Progressing Goal: Respiratory complications will improve Outcome: Progressing   Problem: Elimination: Goal: Will not experience complications related to urinary retention Outcome: Progressing   Problem: Safety: Goal: Non-violent Restraint(s) Outcome: Not Progressing   Problem: Education: Goal: Knowledge of General Education information will improve Description: Including pain rating scale, medication(s)/side effects and non-pharmacologic comfort measures Outcome: Not Progressing   Problem: Health Behavior/Discharge Planning: Goal: Ability to manage health-related needs will improve Outcome: Not Progressing

## 2024-07-31 NOTE — Progress Notes (Signed)
 PROGRESS NOTE    Jacob [name]  FMW:[id number] DOB: 1991/07/25 DOA: 07/29/2024 PCP: Pcp, No   Brief Narrative:  [name] is a 33 year old gentleman with a history of type 1 diabetes mellitus who was found down by EMS unresponsive with undetectably high glucose per EMS. On ED arrival patient was seen hypothermic and tachycardic with decreased mentation. Preliminary blood work concerning for DKA was admitted under critical care team and was started on insulin  drip and managed DKA per protocol.  Eventually transferred under [facility] 07/31/2024.  Assessment & Plan:   Principal Problem:   DKA (diabetic ketoacidosis) (HCC)  DKA/type 1 diabetes mellitus: DKA resolved.  He is off of insulin  drip.  Currently on 15 units Lantus  twice daily and sensitive scale SSI.  Blood sugar improved but slightly elevated.  Will switch to resistant scale SSI.   Encephalopathy secondary to DKA: Resolved.  However reportedly, patient at times can become belligerent for that reason, he was in wrist restraints.  Will ask nurses to observe him for now.   Leukocytosis with lactic acidosis: Has been started on Zosyn empirically.  Blood culture negative.  Patient has remained afebrile since admission.  Lactic acidosis has resolved.  Leukocytosis is improving.  Personally, I think his leukocytosis was due to severe dehydration causing severe hemoconcentration.  Will continue antibiotics for 1 more day, if afebrile or no other evidence of infection, will consider discontinuation.  Acute kidney injury/dehydration: Presented with creatinine of 4.94.  Baseline level unknown.  Likely secondary to severe DKA.  Improving and down to 1.28 today.  Avoid nephrotoxic agents.  Hypokalemia: Will replenish.  Hyponatremia: 152.  He is asymptomatic.  Monitor.  DVT prophylaxis: enoxaparin  (LOVENOX ) injection 40 mg Start: 07/30/24 0900 SCDs Start: 07/29/24 1834   Code Status: Full Code  Family Communication:  None present at bedside.   Plan of care discussed with patient in length and he/she verbalized understanding and agreed with it.  Status is: Inpatient Remains inpatient appropriate because: Hyponatremic, improving creatinine, needs to be seen by PT OT.   Estimated body mass index is 17.81 kg/m as calculated from the following:   Height as of this encounter: 5' 9 (1.753 m).   Weight as of this encounter: 54.7 kg.    Nutritional Assessment: Body mass index is 17.81 kg/m.SABRA Seen by dietician.  I agree with the assessment and plan as outlined below: Nutrition Status:        . Skin Assessment: I have examined the patient's skin and I agree with the wound assessment as performed by the wound care RN as outlined below:    Consultants:  None  Procedures:  None  Antimicrobials:  Anti-infectives (From admission, onward)    Start     Dose/Rate Route Frequency Ordered Stop   07/31/24 0800  linezolid (ZYVOX) tablet 600 mg        600 mg Oral 2 times daily 07/30/24 0745     07/30/24 0830  piperacillin-tazobactam (ZOSYN) IVPB 3.375 g        3.375 g 12.5 mL/hr over 240 Minutes Intravenous Every 8 hours 07/30/24 0740     07/30/24 0200  piperacillin-tazobactam (ZOSYN) IVPB 2.25 g  Status:  Discontinued        2.25 g 100 mL/hr over 30 Minutes Intravenous Every 6 hours 07/29/24 2016 07/30/24 0740   07/29/24 2015  vancomycin variable dose per unstable renal function (pharmacist dosing)  Status:  Discontinued         Does not apply See admin  instructions 07/29/24 2016 07/30/24 0745   07/29/24 1845  piperacillin-tazobactam (ZOSYN) IVPB 3.375 g        3.375 g 100 mL/hr over 30 Minutes Intravenous  Once 07/29/24 1831 07/29/24 1904   07/29/24 1830  vancomycin (VANCOREADY) IVPB 1500 mg/300 mL        1,500 mg 150 mL/hr over 120 Minutes Intravenous  Once 07/29/24 1819 07/29/24 2105         Subjective: Patient seen and examined, he was fully alert and oriented although it took some time for him to respond to the  month or the year.  He told me that he has mother who lives in town but he was homeless and living on the streets prior to coming to the hospital.  He did not allow me to call his mother.  He has not spoken to her in days.  Objective: Vitals:   07/31/24 0100 07/31/24 0400 07/31/24 0500 07/31/24 0810  BP: (!) 139/91 (!) 137/92  (!) 143/103  Pulse:    64  Resp: 11 11  14   Temp:    98 F (36.7 C)  TempSrc:    Axillary  SpO2:    100%  Weight:   54.7 kg   Height:        Intake/Output Summary (Last 24 hours) at 07/31/2024 0905 Last data filed at 07/30/2024 1900 Gross per 24 hour  Intake 2610.73 ml  Output 645 ml  Net 1965.73 ml   Filed Weights   07/29/24 2008 07/30/24 0500 07/31/24 0500  Weight: 53.1 kg 54.8 kg 54.7 kg    Examination:  General exam: Appears calm and comfortable  Respiratory system: Clear to auscultation. Respiratory effort normal. Cardiovascular system: S1 & S2 heard, RRR. No JVD, murmurs, rubs, gallops or clicks. No pedal edema. Gastrointestinal system: Abdomen is nondistended, soft and nontender. No organomegaly or masses felt. Normal bowel sounds heard. Central nervous system: Alert and oriented. No focal neurological deficits. Extremities: Symmetric 5 x 5 power. Skin: No rashes, lesions or ulcers Psychiatry: Judgement and insight appear poor.   Data Reviewed: I have personally reviewed following labs and imaging studies  CBC: Recent Labs  Lab 07/29/24 1719 07/29/24 1724 07/29/24 1733 07/29/24 2118 07/30/24 0959 07/31/24 0154  WBC 34.1*  --  35.8*  --  23.3* 15.6*  NEUTROABS 31.7*  --  DUPLICATE REQUEST  SEE [id number]  --   --  13.0*  HGB 13.1 16.0 13.0 13.3 12.4* 11.1*  HCT 50.0 47.0 48.7 39.0 36.0* 32.6*  MCV 108.5*  --  108.2*  --  83.3 83.4  PLT 492*  --  468*  --  334 271   Basic Metabolic Panel: Recent Labs  Lab 07/29/24 1733 07/29/24 2115 07/29/24 2118 07/30/24 0130 07/30/24 0959 07/31/24 0154  NA 122* 142 139 149* 153* 152*  K >7.5*  4.7 4.5 4.1 3.6 2.9*  CL 82* 104  --  111 110 108  CO2 <7* 10*  --  21* 29 33*  GLUCOSE >1,200* 710*  --  321* 171* 113*  BUN 48* 39*  --  32* 26* 18  CREATININE 4.94* 3.81*  --  2.50* 1.78* 1.28*  CALCIUM  13.2* 11.6*  --  10.9* 9.9 9.4  MG  --  3.1*  --   --   --   --   PHOS  --  4.9*  --   --   --   --    GFR: Estimated Creatinine Clearance: 64.1 mL/min (A) (by C-G formula based  on SCr of 1.28 mg/dL (H)). Liver Function Tests: Recent Labs  Lab 07/31/24 0154  AST 25  ALT 18  ALKPHOS 54  BILITOT 0.9  PROT 5.5*  ALBUMIN 3.0*   No results for input(s): LIPASE, AMYLASE in the last 168 hours. Recent Labs  Lab 07/29/24 2115  AMMONIA 48*   Coagulation Profile: Recent Labs  Lab 07/29/24 2115  INR 1.0   Cardiac Enzymes: Recent Labs  Lab 07/29/24 1719  CKTOTAL 145   BNP (last 3 results) No results for input(s): PROBNP in the last 8760 hours. HbA1C: No results for input(s): HGBA1C in the last 72 hours. CBG: Recent Labs  Lab 07/30/24 1654 07/30/24 1956 07/31/24 0033 07/31/24 0426 07/31/24 0807  GLUCAP 218* 120* 152* 209* 126*   Lipid Profile: No results for input(s): CHOL, HDL, LDLCALC, TRIG, CHOLHDL, LDLDIRECT in the last 72 hours. Thyroid  Function Tests: No results for input(s): TSH, T4TOTAL, FREET4, T3FREE, THYROIDAB in the last 72 hours. Anemia Panel: No results for input(s): VITAMINB12, FOLATE, FERRITIN, TIBC, IRON, RETICCTPCT in the last 72 hours. Sepsis Labs: Recent Labs  Lab 07/29/24 1828 07/30/24 0959  LATICACIDVEN 7.2* 1.9    Recent Results (from the past 240 hours)  Blood Culture (routine x 2)     Status: None (Preliminary result)   Collection Time: 07/29/24  6:19 PM   Specimen: BLOOD LEFT FOREARM  Result Value Ref Range Status   Specimen Description BLOOD LEFT FOREARM  Final   Special Requests   Final    BOTTLES DRAWN AEROBIC AND ANAEROBIC Blood Culture results may not be optimal due to an  inadequate volume of blood received in culture bottles   Culture   Final    NO GROWTH < 24 HOURS Performed at [facility], [address], [place], [place] [address]    Report Status PENDING  Incomplete  Blood Culture (routine x 2)     Status: None (Preliminary result)   Collection Time: 07/29/24  6:24 PM   Specimen: BLOOD RIGHT ARM  Result Value Ref Range Status   Specimen Description BLOOD RIGHT ARM  Final   Special Requests   Final    BOTTLES DRAWN AEROBIC AND ANAEROBIC Blood Culture results may not be optimal due to an inadequate volume of blood received in culture bottles   Culture   Final    NO GROWTH < 24 HOURS Performed at [facility], [address] [place], [place] [address]    Report Status PENDING  Incomplete  MRSA Next Gen by PCR, Nasal     Status: None   Collection Time: 07/29/24  6:34 PM   Specimen: Nasal Mucosa; Nasal Swab  Result Value Ref Range Status   MRSA by PCR Next Gen NOT DETECTED NOT DETECTED Final    Comment: (NOTE) The GeneXpert MRSA Assay (FDA approved for NASAL specimens only), is one component of a comprehensive MRSA colonization surveillance program. It is not intended to diagnose MRSA infection nor to guide or monitor treatment for MRSA infections. Test performance is not FDA approved in patients less than 38 years old. Performed at [facility], [address], [place], [place] [address]      Radiology Studies: CT Cervical Spine Wo Contrast Result Date: 07/29/2024 CLINICAL DATA:  Neck trauma, found down EXAM: CT CERVICAL SPINE WITHOUT CONTRAST TECHNIQUE: Multidetector CT imaging of the cervical spine was performed without intravenous contrast. Multiplanar CT image reconstructions were also generated. RADIATION DOSE REDUCTION: This exam was performed according  to the departmental dose-optimization program which includes automated exposure control, adjustment of the mA and/or kV according to patient size and/or use of iterative  reconstruction technique. COMPARISON:  None Available. FINDINGS: Alignment: Mild left convex curvature of the cervical spine, likely positional. Otherwise alignment is anatomic. Skull base and vertebrae: No acute fracture. No primary bone lesion or focal pathologic process. Soft tissues and spinal canal: No prevertebral fluid or swelling. No visible canal hematoma. Disc levels:  No significant spondylosis or facet hypertrophy. Upper chest: Airway is patent.  Lung apices are clear. Other: Reconstructed images demonstrate no additional findings. IMPRESSION: 1. No acute cervical spine fracture. Electronically Signed   By: Ozell Daring M.D.   On: 07/29/2024 20:03   CT Head Wo Contrast Result Date: 07/29/2024 CLINICAL DATA:  Found down, altered level of consciousness EXAM: CT HEAD WITHOUT CONTRAST TECHNIQUE: Contiguous axial images were obtained from the base of the skull through the vertex without intravenous contrast. RADIATION DOSE REDUCTION: This exam was performed according to the departmental dose-optimization program which includes automated exposure control, adjustment of the mA and/or kV according to patient size and/or use of iterative reconstruction technique. COMPARISON:  05/28/2024 FINDINGS: Brain: No acute infarct or hemorrhage. Lateral ventricles are unremarkable. 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 Remaining midline structures are unremarkable. No acute extra-axial fluid collections. No mass effect. Vascular: No hyperdense vessel or unexpected calcification. Skull: Normal. Negative for fracture or focal lesion. Sinuses/Orbits: No acute finding. Other: None. IMPRESSION: 1. No acute intracranial process. 2. Stable colloid cyst. Electronically Signed   By: Ozell Daring M.D.   On: 07/29/2024 20:01   DG Chest Portable 1 View Result Date: 07/29/2024 EXAM: 1 VIEW(S) XRAY OF THE CHEST 07/29/2024 05:39:00 PM COMPARISON: None available. CLINICAL HISTORY: ams. AMS; Hyperglycemia  FINDINGS: LUNGS AND PLEURA: No focal pulmonary opacity. No pulmonary edema. No pleural effusion. No pneumothorax. HEART AND MEDIASTINUM: No acute abnormality of the cardiac and mediastinal silhouettes. BONES AND SOFT TISSUES: Overlying leads noted. No acute osseous abnormality. IMPRESSION: 1. No acute abnormalities. Electronically signed by: Norman Gatlin MD 07/29/2024 06:02 PM EDT RP Workstation: [id number]    Scheduled Meds:  Chlorhexidine  Gluconate Cloth  6 each Topical Daily   enoxaparin  (LOVENOX ) injection  40 mg Subcutaneous Q24H   insulin  aspart  0-9 Units Subcutaneous Q4H   insulin  glargine  15 Units Subcutaneous BID   linezolid  600 mg Oral BID   potassium chloride   40 mEq Oral Q4H   Continuous Infusions:  piperacillin-tazobactam (ZOSYN)  IV 3.375 g (07/31/24 0313)   thiamine  (VITAMIN B1) injection 500 mg (07/30/24 2344)     LOS: 2 days   Fredia Skeeter, MD [facility]  07/31/2024, 9:05 AM   *Please note that this is a verbal dictation therefore any spelling or grammatical errors are due to the Dragon Medical One system interpretation.  Please page via Amion and do not message via secure chat for urgent patient care matters. Secure chat can be used for non urgent patient care matters.  How to contact the [facility] Attending or Consulting provider 7A - 7P or covering provider during after hours 7P -7A, for this patient?  Check the care team in [facility] and look for a) attending/consulting [facility] provider listed and b) the [facility] team listed. Page or secure chat 7A-7P. Log into [email or web address] and use [facility]'s universal password to access. If you do not have the password, please contact the hospital operator. Locate the [facility] provider you are  looking for under [facility] and page to a number that you can be directly reached. If you still have difficulty reaching the provider, please page the [facility] (Director on Call) for the Hospitalists listed on amion for assistance.

## 2024-07-31 NOTE — Telephone Encounter (Signed)
 Talked to pt's mother. Pt is currently in the hospital.

## 2024-07-31 NOTE — Inpatient Diabetes Management (Signed)
 Inpatient Diabetes Program Recommendations  AACE/ADA: New Consensus Statement on Inpatient Glycemic Control (2015)  Target Ranges:  Prepandial:   less than 140 mg/dL      Peak postprandial:   less than 180 mg/dL (1-2 hours)      Critically ill patients:  140 - 180 mg/dL   Lab Results  Component Value Date   GLUCAP 333 (H) 07/31/2024    Review of Glycemic Control  Latest Reference Range & Units 07/31/24 00:33 07/31/24 04:26 07/31/24 08:07 07/31/24 11:09  Glucose-Capillary 70 - 99 mg/dL 847 (H) 790 (H) 873 (H) 333 (H)  (H): Data is abnormally high Diabetes history: type 1 Outpatient Diabetes medications: Tresiba  24 units daily, Novolog  7 units TID Current orders for Inpatient glycemic control: Novolog  0-20 units TID & HS, Lantus  15 units BID   Inpatient Diabetes Program Recommendations:    Consider adding Novolog  3 units TID (assuming patient is consuming >50% of meals) and changing correction back to Novolog  0-9 units TID & HS   Of note: Noted patient has had 6 inpatient admissions and 9 ED admissions over the past 6 months. Was discharged on 07/17/24. Followed by [name] in Internal Medicine.   Thanks, Tinnie Minus, MSN, RNC-OB Diabetes Coordinator [phone] (8a-5p)

## 2024-07-31 NOTE — Evaluation (Signed)
 Physical Therapy Evaluation Patient Details Name: Jacob [name] MRN: [id number] DOB: 09-22-1991 Today's Date: 07/31/2024  History of Present Illness  33 year old gentleman admitted 9/28 who was found down by EMS unresponsive with undetectably high glucose per EMS. On ED arrival patient was seen hypothermic and tachycardic with decreased mentation. with a history of type 1 diabetes mellitus  Clinical Impression  Pt admitted with above diagnosis. Required supervision to CGA with most mobility today. Demonstrates some mild instability but self corrects with use of rail in hallway. Pt more alert towards end of session. Appreciative of therapy assisting out of bed to mobilize. Assisted with hygiene after having BM in bed (NT changed linens.) Anticipate good functional recovery, unlikely to need any kind of post acute needs. Will follow acutely. Pt currently with functional limitations due to the deficits listed below (see PT Problem List). Pt will benefit from acute skilled PT to increase their independence and safety with mobility to allow discharge.           If plan is discharge home, recommend the following: A little help with walking and/or transfers;A little help with bathing/dressing/bathroom;Assistance with cooking/housework;Direct supervision/assist for medications management;Direct supervision/assist for financial management;Assist for transportation;Supervision due to cognitive status   Can travel by private vehicle        Equipment Recommendations None recommended by PT  Recommendations for Other Services       Functional Status Assessment Patient has had a recent decline in their functional status and demonstrates the ability to make significant improvements in function in a reasonable and predictable amount of time.     Precautions / Restrictions Precautions Precautions: Fall Recall of Precautions/Restrictions: Impaired Restrictions Weight Bearing Restrictions Per Provider  Order: No      Mobility  Bed Mobility Overal bed mobility: Needs Assistance Bed Mobility: Rolling, Sidelying to Sit Rolling: Supervision Sidelying to sit: Supervision       General bed mobility comments: Supervision for safety, cues for awareness of proximity to edge of bed.    Transfers Overall transfer level: Needs assistance Equipment used: None Transfers: Sit to/from Stand Sit to Stand: Supervision           General transfer comment: Supervision for safety, mild sway, able to self correct. Poor awareness of lines/leads.    Ambulation/Gait Ambulation/Gait assistance: Contact guard assist Gait Distance (Feet): 275 Feet Assistive device: None Gait Pattern/deviations: Step-through pattern, Decreased stride length, Staggering right, Drifts right/left Gait velocity: dec Gait velocity interpretation: <1.8 ft/sec, indicate of risk for recurrent falls   General Gait Details: Intermittent staggering towards right, touching rail in hallway to correct. CGA for safety at all times. Cues for awareness, and safety. Seemed to improve with distance, pt more alert.  Stairs            Wheelchair Mobility     Tilt Bed    Modified Rankin (Stroke Patients Only)       Balance Overall balance assessment: Needs assistance Sitting-balance support: No upper extremity supported, Feet supported Sitting balance-Leahy Scale: Good     Standing balance support: No upper extremity supported, During functional activity Standing balance-Leahy Scale: Fair                               Pertinent Vitals/Pain Pain Assessment Pain Assessment: No/denies pain    Home Living Family/patient expects to be discharged to:: Shelter/Homeless  Additional Comments: Limited historian. Notes from discussion with mother suggest pt homeless.    Prior Function Prior Level of Function : Independent/Modified Independent;Patient poor historian/Family not  available             Mobility Comments: states he likes to play baseketball ADLs Comments: assume ind     Extremity/Trunk Assessment   Upper Extremity Assessment Upper Extremity Assessment: Defer to OT evaluation    Lower Extremity Assessment Lower Extremity Assessment: Generalized weakness       Communication   Communication Communication: No apparent difficulties    Cognition Arousal: Lethargic Behavior During Therapy: Flat affect   PT - Cognitive impairments: No family/caregiver present to determine baseline, Memory, Sequencing, Problem solving, Safety/Judgement, Awareness                       PT - Cognition Comments: Pt able to state he is in the hospital. Recalling some events since admission. Following commands: Intact       Cueing Cueing Techniques: Verbal cues, Gestural cues     General Comments General comments (skin integrity, edema, etc.): VSS. incontinent of stool in bed, aware. Assisted pt with hygiene. Linens changed by NT.    Exercises     Assessment/Plan    PT Assessment Patient needs continued PT services  PT Problem List Decreased strength;Decreased activity tolerance;Decreased balance;Decreased mobility;Decreased coordination;Decreased cognition;Decreased knowledge of use of DME;Decreased safety awareness;Decreased knowledge of precautions       PT Treatment Interventions DME instruction;Gait training;Stair training;Functional mobility training;Therapeutic activities;Therapeutic exercise;Balance training;Neuromuscular re-education;Patient/family education;Cognitive remediation    PT Goals (Current goals can be found in the Care Plan section)  Acute Rehab PT Goals Patient Stated Goal: Get well PT Goal Formulation: With patient Time For Goal Achievement: 08/14/24 Potential to Achieve Goals: Good    Frequency Min 2X/week     Co-evaluation               AM-PAC PT 6 Clicks Mobility  Outcome Measure Help needed  turning from your back to your side while in a flat bed without using bedrails?: None Help needed moving from lying on your back to sitting on the side of a flat bed without using bedrails?: A Little Help needed moving to and from a bed to a chair (including a wheelchair)?: A Little Help needed standing up from a chair using your arms (e.g., wheelchair or bedside chair)?: A Little Help needed to walk in hospital room?: A Little Help needed climbing 3-5 steps with a railing? : A Little 6 Click Score: 19    End of Session Equipment Utilized During Treatment: Gait belt Activity Tolerance: Patient tolerated treatment well Patient left: in bed;with call bell/phone within reach;with bed alarm set;with SCD's reapplied (Chair position) Nurse Communication: Mobility status PT Visit Diagnosis: Unsteadiness on feet (R26.81);Other abnormalities of gait and mobility (R26.89);Muscle weakness (generalized) (M62.81);Difficulty in walking, not elsewhere classified (R26.2);Other symptoms and signs involving the nervous system (R29.898)    Time: 8464-8391 PT Time Calculation (min) (ACUTE ONLY): 33 min   Charges:   PT Evaluation $PT Eval Low Complexity: 1 Low PT Treatments $Gait Training: 8-22 mins PT General Charges $$ ACUTE PT VISIT: 1 Visit         Leontine Roads, PT, DPT [facility]  Rehabilitation Services Physical Therapist Office: [phone] Website: [email or web address]   Leontine GORMAN Roads 07/31/2024, 4:33 PM

## 2024-08-01 DIAGNOSIS — E1310 Other specified diabetes mellitus with ketoacidosis without coma: Secondary | ICD-10-CM | POA: Diagnosis not present

## 2024-08-01 LAB — CBC WITH DIFFERENTIAL/PLATELET
Abs Immature Granulocytes: 0.02 K/uL (ref 0.00–0.07)
Basophils Absolute: 0 K/uL (ref 0.0–0.1)
Basophils Relative: 0 %
Eosinophils Absolute: 0.1 K/uL (ref 0.0–0.5)
Eosinophils Relative: 1 %
HCT: 38.1 % — ABNORMAL LOW (ref 39.0–52.0)
Hemoglobin: 12.5 g/dL — ABNORMAL LOW (ref 13.0–17.0)
Immature Granulocytes: 0 %
Lymphocytes Relative: 34 %
Lymphs Abs: 2.7 K/uL (ref 0.7–4.0)
MCH: 28.7 pg (ref 26.0–34.0)
MCHC: 32.8 g/dL (ref 30.0–36.0)
MCV: 87.4 fL (ref 80.0–100.0)
Monocytes Absolute: 0.6 K/uL (ref 0.1–1.0)
Monocytes Relative: 7 %
Neutro Abs: 4.5 K/uL (ref 1.7–7.7)
Neutrophils Relative %: 58 %
Platelets: 260 K/uL (ref 150–400)
RBC: 4.36 MIL/uL (ref 4.22–5.81)
RDW: 14.5 % (ref 11.5–15.5)
WBC: 7.9 K/uL (ref 4.0–10.5)
nRBC: 0 % (ref 0.0–0.2)

## 2024-08-01 LAB — GLUCOSE, CAPILLARY
Glucose-Capillary: 108 mg/dL — ABNORMAL HIGH (ref 70–99)
Glucose-Capillary: 112 mg/dL — ABNORMAL HIGH (ref 70–99)
Glucose-Capillary: 134 mg/dL — ABNORMAL HIGH (ref 70–99)
Glucose-Capillary: 167 mg/dL — ABNORMAL HIGH (ref 70–99)
Glucose-Capillary: 168 mg/dL — ABNORMAL HIGH (ref 70–99)
Glucose-Capillary: 199 mg/dL — ABNORMAL HIGH (ref 70–99)
Glucose-Capillary: 202 mg/dL — ABNORMAL HIGH (ref 70–99)
Glucose-Capillary: 247 mg/dL — ABNORMAL HIGH (ref 70–99)
Glucose-Capillary: 352 mg/dL — ABNORMAL HIGH (ref 70–99)
Glucose-Capillary: 57 mg/dL — ABNORMAL LOW (ref 70–99)

## 2024-08-01 LAB — BASIC METABOLIC PANEL WITH GFR
Anion gap: 9 (ref 5–15)
BUN: 12 mg/dL (ref 6–20)
CO2: 30 mmol/L (ref 22–32)
Calcium: 9.3 mg/dL (ref 8.9–10.3)
Chloride: 101 mmol/L (ref 98–111)
Creatinine, Ser: 1 mg/dL (ref 0.61–1.24)
GFR, Estimated: >60 mL/min (ref >60)
Glucose, Bld: 108 mg/dL — ABNORMAL HIGH (ref 70–99)
Potassium: 3.9 mmol/L (ref 3.5–5.1)
Sodium: 140 mmol/L (ref 135–145)

## 2024-08-01 MED ORDER — DIPHENHYDRAMINE HCL 50 MG/ML IJ SOLN: 50.0000 mg | Freq: Once | INTRAMUSCULAR | Status: DC | PRN

## 2024-08-01 MED ORDER — INSULIN GLARGINE 100 UNIT/ML ~~LOC~~ SOLN
12.0000 [IU] | Freq: Two times a day (BID) | SUBCUTANEOUS | Status: DC
  Administered 2024-08-01 – 2024-08-02 (×6): 12 [IU] via SUBCUTANEOUS
  Filled 2024-08-01 (×4): qty 0.12

## 2024-08-01 MED ORDER — INSULIN ASPART 100 UNIT/ML IJ SOLN
3.0000 [IU] | Freq: Three times a day (TID) | INTRAMUSCULAR | Status: DC
  Administered 2024-08-01 – 2024-08-03 (×12): 3 [IU] via SUBCUTANEOUS

## 2024-08-01 MED ORDER — INSULIN GLARGINE 100 UNIT/ML ~~LOC~~ SOLN
12.0000 [IU] | Freq: Once | SUBCUTANEOUS | Status: AC
  Filled 2024-08-01: qty 0.12

## 2024-08-01 MED ORDER — INSULIN GLARGINE 100 UNITS/ML SOLOSTAR PEN: 12.0000 [IU] | PEN_INJECTOR | Freq: Once | SUBCUTANEOUS | Status: DC

## 2024-08-01 MED ORDER — HALOPERIDOL 5 MG PO TABS
10.0000 mg | ORAL_TABLET | Freq: Every day | ORAL | Status: DC
Start: 2024-08-01 — End: 2024-08-03
  Administered 2024-08-01 – 2024-08-02 (×4): 10 mg via ORAL
  Filled 2024-08-01 (×2): qty 2

## 2024-08-01 NOTE — Inpatient Diabetes Management (Signed)
 Inpatient Diabetes Program Recommendations  AACE/ADA: New Consensus Statement on Inpatient Glycemic Control (2015)  Target Ranges:  Prepandial:   less than 140 mg/dL      Peak postprandial:   less than 180 mg/dL (1-2 hours)      Critically ill patients:  140 - 180 mg/dL   Lab Results  Component Value Date   GLUCAP 352 (H) 08/01/2024    Review of Glycemic Control  Latest Reference Range & Units 07/31/24 22:50 08/01/24 00:01 08/01/24 03:57 08/01/24 07:48 08/01/24 08:36 08/01/24 11:36  Glucose-Capillary 70 - 99 mg/dL 786 (H) 831 (H) 865 (H) 57 (L) 167 (H) 352 (H)  (H): Data is abnormally high (L): Data is abnormally low Diabetes history: type 1 Outpatient Diabetes medications: Tresiba  24 units daily, Novolog  7 units TID Current orders for Inpatient glycemic control: Novolog  0-20 units TID & HS, Lantus  12 units BID   Inpatient Diabetes Program Recommendations:    Noted hypoglycemia this AM following basal dose and subsequent changes. Also, Consider adding Novolog  3 units TID (assuming patient is consuming >50% of meals) and changing correction back to Novolog  0-6 units TID & HS.  Thanks, Tinnie Minus, MSN, RNC-OB Diabetes Coordinator [phone] (8a-5p)

## 2024-08-01 NOTE — Plan of Care (Signed)

## 2024-08-01 NOTE — TOC Progression Note (Signed)
 Transition of Care [facility]) - Progression Note    Patient Details  Name: Jacob [name] MRN: [id number] Date of Birth: [birth year]  Transition of Care [facility] CM/SW Contact  Andrez JULIANNA George, RN Phone Number: 08/01/2024, 1:09 PM  Clinical Narrative:     Plan is for inpatient psyche admission once bed available. IP Care management following.  Expected Discharge Plan:  (TBD)                 Expected Discharge Plan and Services                                               Social Drivers of Health (SDOH) Interventions SDOH Screenings   Food Insecurity: Food Insecurity Present (07/31/2024)  Housing: High Risk (07/31/2024)  Transportation Needs: Unmet Transportation Needs (07/31/2024)  Utilities: At Risk (07/31/2024)    Readmission Risk Interventions     No data to display

## 2024-08-01 NOTE — Evaluation (Signed)
 Occupational Therapy Evaluation Patient Details Name: Jacob [name] MRN: [id number] DOB: June 09, 1991 Today's Date: 08/01/2024   History of Present Illness   33 year old gentleman admitted 9/28 who was found down by EMS unresponsive with undetectably high glucose per EMS. On ED arrival patient was seen hypothermic and tachycardic with decreased mentation. with a history of type 1 diabetes mellitus     Clinical Impressions Pt greeted in supine, agreeable and motivated to participate with OT. PTA, he reports being indep with ADLs and mobility; un-housed x2 years per patient. He wishes to discharge to behavioral health to work on his anger management. He presents today with generalized weakness and instability; childlike behaviors and some cog deficits. Functionally, he was no more than supervision for all transfers/ambulation without AD and UB/LB ADLs. See below for details.  Pt is currently functioning at a level that is adequate for discharge from acute OT services. Current recommendation is to return to prior living situation (preferably have assistance for medication/DM management given cognitive deficits and health compliance). No post-acute OT needs at time of discharge.     If plan is discharge home, recommend the following:   Assistance with cooking/housework;Assist for transportation;Supervision due to cognitive status     Functional Status Assessment         Equipment Recommendations   None recommended by OT     Recommendations for Other Services    None     Precautions/Restrictions   Precautions Precautions: Fall Recall of Precautions/Restrictions: Impaired Restrictions Weight Bearing Restrictions Per Provider Order: No     Mobility Bed Mobility Overal bed mobility: Needs Assistance Bed Mobility: Supine to Sit, Sit to Supine     Supine to sit: Supervision Sit to supine: Supervision   General bed mobility comments: cues for safety and IV mgmt     Transfers Overall transfer level: Needs assistance Equipment used: None Transfers: Sit to/from Stand Sit to Stand: Supervision           General transfer comment: Safety due to pt with little regard for IV line/telemetry      Balance Overall balance assessment: Needs assistance Sitting-balance support: No upper extremity supported, Feet supported Sitting balance-Leahy Scale: Good Sitting balance - Comments: seated EOB   Standing balance support: No upper extremity supported, During functional activity Standing balance-Leahy Scale: Fair Standing balance comment: no overt LOB in stance, although intermittently unsteady                           ADL either performed or assessed with clinical judgement   ADL Overall ADL's : Modified independent                                       General ADL Comments: performed toileting (needs cues to clean residual stool out of underwear) with mod I; standing grooming and LB dressing also with mod I     Vision         Perception         Praxis         Pertinent Vitals/Pain Pain Assessment Pain Assessment: No/denies pain     Extremity/Trunk Assessment Upper Extremity Assessment Upper Extremity Assessment: Generalized weakness   Lower Extremity Assessment Lower Extremity Assessment: Defer to PT evaluation       Communication Communication Communication: No apparent difficulties   Cognition Arousal: Alert Behavior During Therapy: Flat affect,  Impulsive (childlike) Cognition: Cognition impaired     Awareness: Online awareness impaired Memory impairment (select all impairments): Working [employer] fast streamer, Short-term memory Attention impairment (select first level of impairment): Sustained attention Executive functioning impairment (select all impairments): Organization, Sequencing, Problem solving, Reasoning OT - Cognition Comments: pt with poor insight and decreased safety awareness, often  impulsive and with little regard for IV line                 Following commands: Intact       Cueing  General Comments   Cueing Techniques: Verbal cues;Gestural cues  VSS throughout   Exercises     Shoulder Instructions      Home Living Family/patient expects to be discharged to:: Shelter/Homeless                                 Additional Comments: pt reports being unhouse x2 years; anticipates/wishes for d/c to behavioral health to work on anger management      Prior Functioning/Environment Prior Level of Function : Independent/Modified Independent;Patient poor historian/Family not available             Mobility Comments: states he likes to play baseketball ADLs Comments: indep PTA    OT Problem List: Decreased strength;Impaired balance (sitting and/or standing);Decreased cognition   OT Treatment/Interventions:        OT Goals(Current goals can be found in the care plan section)   Acute Rehab OT Goals Patient Stated Goal: to d/c to behavioral health OT Goal Formulation: With patient   OT Frequency:       Co-evaluation              AM-PAC OT 6 Clicks Daily Activity     Outcome Measure Help from another person eating meals?: None Help from another person taking care of personal grooming?: None Help from another person toileting, which includes using toliet, bedpan, or urinal?: A Little Help from another person bathing (including washing, rinsing, drying)?: A Little Help from another person to put on and taking off regular upper body clothing?: None Help from another person to put on and taking off regular lower body clothing?: None 6 Click Score: 22   End of Session Equipment Utilized During Treatment: Gait belt Nurse Communication: Mobility status  Activity Tolerance: Patient tolerated treatment well Patient left: in bed;with bed alarm set  OT Visit Diagnosis: Unsteadiness on feet (R26.81);Muscle weakness  (generalized) (M62.81)                Time: 8669-8647 OT Time Calculation (min): 22 min Charges:  OT General Charges $OT Visit: 1 Visit OT Evaluation $OT Eval Low Complexity: 1 Low OT Treatments $Self Care/Home Management : 8-22 mins  Jacob [name] D., MSOT, OTR/L Acute Rehabilitation Services [phone] Secure Chat Preferred  Jacob [name] 08/01/2024, 3:46 PM

## 2024-08-01 NOTE — Progress Notes (Signed)
 PROGRESS NOTE    Jacob [name]  FMW:[id number] DOB: 03/20/1991 DOA: 07/29/2024 PCP: Pcp, No   Brief Narrative:  Jacob [name] is a 33 year old gentleman with a history of type 1 diabetes mellitus who was found down by EMS unresponsive with undetectably high glucose per EMS. On ED arrival patient was seen hypothermic and tachycardic with decreased mentation. Preliminary blood work concerning for DKA was admitted under critical care team and was started on insulin  drip and managed DKA per protocol.  Eventually transferred under [facility] 07/31/2024.  Assessment & Plan:   Principal Problem:   DKA (diabetic ketoacidosis) (HCC)  DKA/type 1 diabetes mellitus: DKA resolved.  He is off of insulin  drip.  Currently on 15 units Lantus  twice daily and sensitive scale SSI.  Hypoglycemia this morning.  Reduce Lantus  to 12 units.  Continue SSI.  However he became hyperglycemic in the early afternoon.  Adding NovoLog  3 units 3 times daily.   Encephalopathy secondary to DKA/history of schizoaffective disorder/psychosis: Encephalopathy resolved.  Patient was much more conversant, alert and oriented.  He was assessed by psychiatry yesterday and reevaluated today.  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  They have started him on medication, recommend inpatient management and they are planning to transition him to a behavioral health.  Appreciate psychiatry help.   Leukocytosis with lactic acidosis: Has been started on Zosyn empirically.  Blood culture negative.  Patient has remained afebrile since admission.  Lactic acidosis has resolved.  Leukocytosis is improving.  Personally, I think his leukocytosis was due to severe dehydration causing severe hemoconcentration which has resolved.  Will discontinue to biotics.  Acute kidney injury/dehydration: Presented with creatinine of 4.94.   Baseline level unknown.  Likely secondary to severe DKA.  Improving and down to 1.28 today.  Avoid nephrotoxic agents.  Hypokalemia: Resolved.  Hyponatremia: Resolved.  DVT prophylaxis: enoxaparin  (LOVENOX ) injection 40 mg Start: 07/30/24 0900 SCDs Start: 07/29/24 1834   Code Status: Full Code  Family Communication:  None present at bedside.  Plan of care discussed with patient in length and he/she verbalized understanding and agreed with it.  Status is: Inpatient Remains inpatient appropriate because: Needs admission to inpatient psych.  Estimated body mass index is 17.91 kg/m as calculated from the following:   Height as of this encounter: 5' 9 (1.753 m).   Weight as of this encounter: 55 kg.    Nutritional Assessment: Body mass index is 17.91 kg/m.SABRA Seen by dietician.  I agree with the assessment and plan as outlined below: Nutrition Status:        . Skin Assessment: I have examined the patient's skin and I agree with the wound assessment as performed by the wound care RN as outlined below:    Consultants:  None  Procedures:  None  Antimicrobials:  Anti-infectives (From admission, onward)    Start     Dose/Rate Route Frequency Ordered Stop   07/31/24 0800  linezolid (ZYVOX) tablet 600 mg        600 mg Oral 2 times daily 07/30/24 0745     07/30/24 0830  piperacillin-tazobactam (ZOSYN) IVPB 3.375 g        3.375 g 12.5 mL/hr over 240 Minutes Intravenous Every 8 hours 07/30/24 0740     07/30/24 0200  piperacillin-tazobactam (ZOSYN) IVPB 2.25 g  Status:  Discontinued        2.25 g 100 mL/hr over 30 Minutes Intravenous  Every 6 hours 07/29/24 2016 07/30/24 0740   07/29/24 2015  vancomycin variable dose per unstable renal function (pharmacist dosing)  Status:  Discontinued         Does not apply See admin instructions 07/29/24 2016 07/30/24 0745   07/29/24 1845  piperacillin-tazobactam (ZOSYN) IVPB 3.375 g        3.375 g 100 mL/hr over 30 Minutes Intravenous  Once  07/29/24 1831 07/29/24 1904   07/29/24 1830  vancomycin (VANCOREADY) IVPB 1500 mg/300 mL        1,500 mg 150 mL/hr over 120 Minutes Intravenous  Once 07/29/24 1819 07/29/24 2105         Subjective: Patient seen and examined.  He is fully alert and oriented.  He is pleasant and calm.  He has no complaints.  He also prefers to go to inpatient psych unit.  Objective: Vitals:   07/31/24 2310 08/01/24 0358 08/01/24 0500 08/01/24 0749  BP: (!) 126/90 (!) 125/92  (!) 123/90  Pulse: 75 75  75  Resp:    19  Temp: 98.6 F (37 C) 98.3 F (36.8 C)  97.9 F (36.6 C)  TempSrc: Oral Oral  Oral  SpO2: 100% 100%  100%  Weight:   55 kg   Height:        Intake/Output Summary (Last 24 hours) at 08/01/2024 0824 Last data filed at 07/31/2024 2204 Gross per 24 hour  Intake --  Output 2300 ml  Net -2300 ml   Filed Weights   07/30/24 0500 07/31/24 0500 08/01/24 0500  Weight: 54.8 kg 54.7 kg 55 kg    Examination:  General exam: Appears calm and comfortable  Respiratory system: Clear to auscultation. Respiratory effort normal. Cardiovascular system: S1 & S2 heard, RRR. No JVD, murmurs, rubs, gallops or clicks. No pedal edema. Gastrointestinal system: Abdomen is nondistended, soft and nontender. No organomegaly or masses felt. Normal bowel sounds heard. Central nervous system: Alert and oriented. No focal neurological deficits. Extremities: Symmetric 5 x 5 power. Skin: No rashes, lesions or ulcers.    Data Reviewed: I have personally reviewed following labs and imaging studies  CBC: Recent Labs  Lab 07/29/24 1719 07/29/24 1724 07/29/24 1733 07/29/24 2118 07/30/24 0959 07/31/24 0154  WBC 34.1*  --  35.8*  --  23.3* 15.6*  NEUTROABS 31.7*  --  DUPLICATE REQUEST  SEE [id number]  --   --  13.0*  HGB 13.1 16.0 13.0 13.3 12.4* 11.1*  HCT 50.0 47.0 48.7 39.0 36.0* 32.6*  MCV 108.5*  --  108.2*  --  83.3 83.4  PLT 492*  --  468*  --  334 271   Basic Metabolic Panel: Recent Labs  Lab  07/29/24 1733 07/29/24 2115 07/29/24 2118 07/30/24 0130 07/30/24 0959 07/31/24 0154  NA 122* 142 139 149* 153* 152*  K >7.5* 4.7 4.5 4.1 3.6 2.9*  CL 82* 104  --  111 110 108  CO2 <7* 10*  --  21* 29 33*  GLUCOSE >1,200* 710*  --  321* 171* 113*  BUN 48* 39*  --  32* 26* 18  CREATININE 4.94* 3.81*  --  2.50* 1.78* 1.28*  CALCIUM  13.2* 11.6*  --  10.9* 9.9 9.4  MG  --  3.1*  --   --   --  1.9  PHOS  --  4.9*  --   --   --   --    GFR: Estimated Creatinine Clearance: 64.5 mL/min (A) (by C-G formula based on SCr of  1.28 mg/dL (H)). Liver Function Tests: Recent Labs  Lab 07/31/24 0154  AST 25  ALT 18  ALKPHOS 54  BILITOT 0.9  PROT 5.5*  ALBUMIN 3.0*   No results for input(s): LIPASE, AMYLASE in the last 168 hours. Recent Labs  Lab 07/29/24 2115  AMMONIA 48*   Coagulation Profile: Recent Labs  Lab 07/29/24 2115  INR 1.0   Cardiac Enzymes: Recent Labs  Lab 07/29/24 1719  CKTOTAL 145   BNP (last 3 results) No results for input(s): PROBNP in the last 8760 hours. HbA1C: No results for input(s): HGBA1C in the last 72 hours. CBG: Recent Labs  Lab 07/31/24 2043 07/31/24 2250 08/01/24 0001 08/01/24 0357 08/01/24 0748  GLUCAP 314* 213* 168* 134* 57*   Lipid Profile: No results for input(s): CHOL, HDL, LDLCALC, TRIG, CHOLHDL, LDLDIRECT in the last 72 hours. Thyroid  Function Tests: No results for input(s): TSH, T4TOTAL, FREET4, T3FREE, THYROIDAB in the last 72 hours. Anemia Panel: No results for input(s): VITAMINB12, FOLATE, FERRITIN, TIBC, IRON, RETICCTPCT in the last 72 hours. Sepsis Labs: Recent Labs  Lab 07/29/24 1828 07/30/24 0959  LATICACIDVEN 7.2* 1.9    Recent Results (from the past 240 hours)  Blood Culture (routine x 2)     Status: None (Preliminary result)   Collection Time: 07/29/24  6:19 PM   Specimen: BLOOD LEFT FOREARM  Result Value Ref Range Status   Specimen Description BLOOD LEFT FOREARM   Final   Special Requests   Final    BOTTLES DRAWN AEROBIC AND ANAEROBIC Blood Culture results may not be optimal due to an inadequate volume of blood received in culture bottles   Culture   Final    NO GROWTH 2 DAYS Performed at [facility], [address] [place], [place] [address]    Report Status PENDING  Incomplete  Blood Culture (routine x 2)     Status: None (Preliminary result)   Collection Time: 07/29/24  6:24 PM   Specimen: BLOOD RIGHT ARM  Result Value Ref Range Status   Specimen Description BLOOD RIGHT ARM  Final   Special Requests   Final    BOTTLES DRAWN AEROBIC AND ANAEROBIC Blood Culture results may not be optimal due to an inadequate volume of blood received in culture bottles   Culture   Final    NO GROWTH 2 DAYS Performed at [facility], [address] [place], [place] [address]    Report Status PENDING  Incomplete  MRSA Next Gen by PCR, Nasal     Status: None   Collection Time: 07/29/24  6:34 PM   Specimen: Nasal Mucosa; Nasal Swab  Result Value Ref Range Status   MRSA by PCR Next Gen NOT DETECTED NOT DETECTED Final    Comment: (NOTE) The GeneXpert MRSA Assay (FDA approved for NASAL specimens only), is one component of a comprehensive MRSA colonization surveillance program. It is not intended to diagnose MRSA infection nor to guide or monitor treatment for MRSA infections. Test performance is not FDA approved in patients less than 33 years old. Performed at [facility], [address] [place], [place] [address]      Radiology Studies: No results found.   Scheduled Meds:  Chlorhexidine  Gluconate Cloth  6 each Topical Daily   enoxaparin  (LOVENOX ) injection  40 mg Subcutaneous Q24H   insulin  aspart  0-20 Units Subcutaneous TID WC   insulin  aspart  0-5 Units Subcutaneous QHS   insulin  glargine  12 Units Subcutaneous BID  linezolid  600 mg Oral BID   nicotine   21 mg Transdermal Daily   Continuous Infusions:   piperacillin-tazobactam (ZOSYN)  IV 3.375 g (08/01/24 0413)   thiamine  (VITAMIN B1) injection 500 mg (07/31/24 2204)     LOS: 3 days   Fredia Skeeter, MD [facility]  08/01/2024, 8:24 AM   *Please note that this is a verbal dictation therefore any spelling or grammatical errors are due to the Dragon Medical One system interpretation.  Please page via Amion and do not message via secure chat for urgent patient care matters. Secure chat can be used for non urgent patient care matters.  How to contact the [facility] Attending or Consulting provider 7A - 7P or covering provider during after hours 7P -7A, for this patient?  Check the care team in [facility] and look for a) attending/consulting [facility] provider listed and b) the [facility] team listed. Page or secure chat 7A-7P. Log into [email or web address] and use [place]'s universal password to access. If you do not have the password, please contact the hospital operator. Locate the [facility] provider you are looking for under [facility] and page to a number that you can be directly reached. If you still have difficulty reaching the provider, please page the [facility] (Director on Call) for the Hospitalists listed on amion for assistance.

## 2024-08-01 NOTE — Consult Note (Signed)
 [facility]iatry New Face-to-Face Psychiatric Evaluation  Service Date: August 01, 2024 LOS:  LOS: 3 days    Primary Psychiatric Diagnosis   Schizoaffective disorder, bipolar type, current episode manic Cocaine Use  Cannabis Use    Assessment  Jacob [name] is a 33 y.o. male admitted medically for 07/29/2024  5:16 PM for altered mental status in the setting of DKA. He carries the psychiatric diagnoses of schizoaffective disorder, bipolar type and has a past medical history of Type 1 Diabetes and Vitamin D  Deficiency.Psychiatry was consulted for optimization of medications and long term management.    His current presentation of increased somnolence is suspected to be due to hyperglycemia vs withdrawal from cocaine positive on admission urine drug screen. Patient unable to engage in exam given intermittent sedation and difficulty staying awake.  Patient confirms that he has taken Haldol  injection for maintenance therapy of schizophrenia.  Denies any current auditory, visual hallucinations or additional psychotic symptoms. Patient is currently in restraints and reports that he was placed in due to a fall earlier. ACTT Team Child psychotherapist and mother, state that he has been disorganized, bizarre and they suspect he may have been responding to internal stimuli as most recently as 1 month ago. The past 3 weeks he has been homeless. Given positive UDS will continue to consider substances role in psychosis vs decompensation of schizophrenia spectrum disorder.  We believe that he is having breakthrough psychosis (disorganized thinking, delusional constructions) that could be a manifestation of his underlying schizoaffective disorder and/or cocaine-induced psychosis (endorsed recent use). It appears he has been unhoused and not in contact with his ACTT team over the past few weeks. No matter the extent that cocaine is contributing to his current picture, he deserves further workup in an inpatient  hospital setting and medication titration to target his psychotic symptoms. He is due for another Haldol  LAI 10/8. Plan will be to supplement his current haldol  decanoate 100 mg given 9/10 with PO haldol  10 mg at bedtime, effectively making his current dose 15-20 mg. If tolerating well and symptoms are resolving, recommend increasing Haldol  Decanoate scheduled for 10/8 from 100 mg to 150 mg or 200 mg (depending on degree of resolution of psychotic symptoms), and discontinuing PO haldol  at that time.    Please see plan below for detailed recommendations.   Diagnoses:  Active Hospital problems: Principal Problem:   DKA (diabetic ketoacidosis) (HCC)   Plan  ## Safety and Observation Level:  - Based on my clinical evaluation, I estimate the patient to be at low risk of self harm in the current setting - At this time, we recommend a routine level of observation. This decision is based on my review of the chart including patient's history and current presentation, interview of the patient, mental status examination, and consideration of suicide risk including evaluating suicidal ideation, plan, intent, suicidal or self-harm behaviors, risk factors, and protective factors. This judgment is based on our ability to directly address suicide risk, implement suicide prevention strategies and develop a safety plan while the patient is in the clinical setting. Please contact our team if there is a concern that risk level has changed.  ## Medications:  -- Start Haldol  10 mg at bedtime for persistent psychosis. Effective dose at this timeis 20 mg given previous Haldol  dec administration on 07/11/2024. If tolerating PO + IM dose well, can increase next scheduled haldol  decanoate (next due 08/08/2024) to 150 mg or 200 mg and discontinue PO regimen.  -- Home  regimen: Haldol  Dec 100 mg q28days, last dispensed on 07/11/2024 -- Agitation Protocol:   Mild Haldol  5 mg and Benadryl  50 mg PO TID prn   Moderate Haldol  5 mg and  Benadryl  50 mg IM TID prn   ## Medical Decision Making Capacity:  -- Not specifically addressed during assessment   ## Further Work-up:  -- Per primary team  -- most recent EKG on 07/29/2024 had QtC of 464 -- Pertinent labwork reviewed earlier this admission includes: BMP, CBGs,   ## Disposition:  -- Inpatient psychiatric hospitalization to titrate medications.  -- If to discharge, contact [facility] ACTT Team at [phone]  to follow-up appropriately  ## Behavioral / Environmental:  -- Recommend re-assessing need for restraints, patient was either waxing or back to baseline AM of 10/1.   ##Legal Status -- Unaware of any current legal issues  Thank you for this consult request. Recommendations have been communicated to the primary team.  We will continue to follow at this time.   Maury Groninger, MD   NEW History  Relevant Aspects of Hospital Course:  Admitted on 07/29/2024 for altered mental status in the setting of hyperglycemia concerning for DKA.  Patient Report:   Patient much more conversant today. Patient was alert and oriented to self and hospital but not to month (September) or situation. Was informed that he went into DKA on presentation to the hospital, to which patient responded again? Patient did not score positive on the CAM-ICU delirium assessment: correctly answered questions, CASABLANCA, followed verbal commands (show two fingers) without issue. Endorses feeling confused before, in the AM and feels much better to converse now. That said, patient thought process appeared disorganized, with circumstantial responses to questions and delusional content. Endorsed responding to internal stimuli over the past month, but does not see the issue with this (life be life-ing). Also endorsed delusional content, believed the [employer] I.[employer] was present at a previous court case to help him, and that they were helpful to him in his daily life. Also believed  that five years ago people in a car with tinted windows offered him anthrax.  Denied suicidal and homicidal thinking. Denied auditory and visual hallucinations. Amenable to inpatient psychiatric stay to optimize psychiatric medications. Patient is cleared from a medical standpoint. AC notified.   ROS:  Per HPI  Collateral information:  [name], mother, [phone]  She been intermittently homeless the last 2-3 years. She notes schizophrenia and bipolar was diagnosed at that time. She notes that he is currently being followed by ACTT services and they are attempting to help him get an apartment. She reports that he get his shot administered at [facility] earlier this month. She suspects at times he may be attempting to talk to people who are not there last week during a car ride with her. She notes mood lability and will be verbally aggressive with her at times. She reports that he has been losing his possessions (cell phone). He has been rejected by [facility], they are attempting to do a standard determining of health through [facility] (although he has been denied by 2 times in the past) and is working [employer] specialist to assist with housing. He has no SSI or SSDI. Mother reports that the social worker has arranged an appointment in October and November to try and gain financial assistance. She reports that he made SI statements about wanting to jump off a bridge a few weeks ago after going to ED.   Madeline,  Social Work ACTT,  [phone]  She is concerned about his homelessness. She reports that he is currently disoriented to person, place, situation and location and doesn't recognize. She reports difficulty managing his diabetes given his schizophrenia. She reports that he has been out in the elements within the last 3 weeks. She states that he was mumbling to himself and could have been RIS 1 month ago.   Psychiatric History:  Information collected from EMR  The patient has a history of  multiple inpatient psychiatric hospitalizations, with the most recent admission occurring from April 3 to February 09 2024, at the [facility]. He reports a past suicide attempt approximately five to six years ago, during which he ingested rat poison.   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 He reports that he is currently unemployed but has been approved for disability benefits for mental health and receives approximately $280 per month in EBT assistance. He identifies no one as a support system, states his religion is Christian-affiliated, and reports basketball as a hobby. He identifies as straight and male, denies any military background   Tobacco use: UTA  Alcohol use: UTA  Drug use: Cocaine and THC, positive on UDS   Family History: per EMR  Family history of substance abuse, stating his mother was addicted to crack cocaine.  Family psych history: Unaware,   The patient's family history is not on file.  Medical History: Past Medical History:  Diagnosis Date   Diabetes mellitus without complication (HCC)     Surgical History: History reviewed. No pertinent surgical history.  Medications:   Current Facility-Administered Medications:    Chlorhexidine  Gluconate Cloth 2 % PADS 6 each, 6 each, Topical, Daily, Harris, Whitney D, NP, 6 each at 08/01/24 0848   dextrose  50 % solution 0-50 mL, 0-50 mL, Intravenous, PRN, Beam, Elsie, MD   haloperidol  (HALDOL ) tablet 5 mg, 5 mg, Oral, TID PRN **AND** diphenhydrAMINE  (BENADRYL ) capsule 50 mg, 50 mg, Oral, TID PRN, Lenard Calin, MD, 50 mg at 07/31/24 2044   diphenhydrAMINE  (BENADRYL ) injection 50 mg, 50 mg, Intramuscular, TID PRN **AND** haloperidol  lactate (HALDOL ) injection 5 mg, 5 mg, Intramuscular, TID PRN **AND** LORazepam  (ATIVAN ) injection  2 mg, 2 mg, Intramuscular, TID PRN, Lenard Calin, MD   docusate sodium  (COLACE) capsule 100 mg, 100 mg, Oral, BID PRN, Arloa, Whitney D, NP   enoxaparin  (LOVENOX ) injection 40 mg, 40 mg, Subcutaneous, Q24H, Pham, Minh Q, [name]-CPP, 40 mg at 08/01/24 0840   insulin  aspart (novoLOG ) injection 0-20 Units, 0-20 Units, Subcutaneous, TID WC, Pahwani, Fredia, MD, 20 Units at 08/01/24 1159   insulin  aspart (novoLOG ) injection 0-5 Units, 0-5 Units, Subcutaneous, QHS, Pahwani, Fredia, MD, 2 Units at 07/31/24 2255   insulin  glargine (LANTUS ) injection 12 Units, 12 Units, Subcutaneous, BID, Pahwani, Ravi, MD   insulin  glargine (LANTUS ) injection 12 Units, 12 Units, Subcutaneous, Once, Paytes, Austin A, [name]   linezolid (ZYVOX) tablet 600 mg, 600 mg, Oral, BID, Pham, Minh Q, [name]-CPP, 600 mg at 08/01/24 0840   nicotine  (NICODERM CQ  - dosed in mg/24 hours) patch 21 mg, 21 mg, Transdermal, Daily, Pahwani, Ravi, MD, 21 mg at 08/01/24 0840   Oral care mouth rinse, 15 mL, Mouth Rinse, PRN, Maree Harder, MD   piperacillin-tazobactam (ZOSYN) IVPB 3.375 g, 3.375 g, Intravenous, Q8H, Pham, Minh Q, [name]-CPP, Last Rate: 12.5 mL/hr  at 08/01/24 1202, 3.375 g at 08/01/24 1202   polyethylene glycol (MIRALAX  / GLYCOLAX ) packet 17 g, 17 g, Oral, Daily PRN, Harris, Whitney D, NP   thiamine  (VITAMIN B1) 500 mg in sodium chloride  0.9 % 50 mL IVPB, 500 mg, Intravenous, TID, Arloa Folks D, NP, Last Rate: 110 mL/hr at 08/01/24 0858, 500 mg at 08/01/24 0858  Allergies: No Known Allergies     Objective  Vital signs:  Temp:  [97.9 F (36.6 C)-98.7 F (37.1 C)] 98 F (36.7 C) (10/01 1136) Pulse Rate:  [75-86] 81 (10/01 1136) Resp:  [10-20] 19 (10/01 1136) BP: (109-130)/(76-109) 122/92 (10/01 1136) SpO2:  [100 %] 100 % (10/01 1136) Weight:  [55 kg] 55 kg (10/01 0500)  Psychiatric Specialty Exam:  Presentation  General Appearance: Appropriate for Environment  Eye Contact:Good  Speech:Clear and Coherent  Speech  Volume:Normal  Handedness:No data recorded  Mood and Affect  Mood:Euthymic  Affect:Appropriate   Thought Process  Thought Processes:Disorganized; Irrevelant  Descriptions of Associations:Circumstantial  Orientation:Partial (self and hospital, not to month (september) or situation (reason for being in hospital))  Thought Content:Tangential; Delusions  History of Schizophrenia/Schizoaffective disorder:Yes  Duration of Psychotic Symptoms:Unable to determine  Hallucinations:Hallucinations: None  Ideas of Reference:None  Suicidal Thoughts:Suicidal Thoughts: No  Homicidal Thoughts:Homicidal Thoughts: No   Sensorium  Memory:Immediate Fair  Judgment:Fair  Insight:Fair   Executive Functions  Concentration:Fair  Attention Span:Fair  Recall:Fair  Fund of Knowledge:Fair  Language:Fair   Psychomotor Activity  Psychomotor Activity:Psychomotor Activity: Normal   Assets  Assets:Resilience   Sleep  Sleep:Sleep: Good    Physical Exam: Physical Exam Constitutional:      General: He is not in acute distress.    Appearance: He is not ill-appearing or diaphoretic.     Comments: Thin appearing  Pulmonary:     Effort: Pulmonary effort is normal. No respiratory distress.    Review of Systems  Psychiatric/Behavioral:  Negative for depression, hallucinations and suicidal ideas. The patient is not nervous/anxious and does not have insomnia.    Blood pressure (!) 122/92, pulse 81, temperature 98 F (36.7 C), temperature source Oral, resp. rate 19, height 5' 9 (1.753 m), weight 55 kg, SpO2 100%. Body mass index is 17.91 kg/m.

## 2024-08-01 NOTE — Plan of Care (Signed)

## 2024-08-01 NOTE — Consult Note (Incomplete)
 [facility] Psychiatry New Face-to-Face Psychiatric Evaluation  Service Date: August 01, 2024 LOS:  LOS: 3 days    Primary Psychiatric Diagnosis   Altered Mental status  Cocaine Use  Cannabis Use    Assessment  Jacob [name] is a 33 y.o. male admitted medically for 07/29/2024  5:16 PM for altered mental status in the setting of DKA. He carries the psychiatric diagnoses of schizoaffective disorder, bipolar type and has a past medical history of Type 1 Diabetes and Vitamin D  Deficiency. Psychiatry was consulted for optimization of medications and long term management.    His current presentation of increased somnolence is suspected to be due to hyperglycemia vs withdrawal from cocaine positive on admission urine drug screen. Patient unable to engage in exam given intermittent sedation and difficulty staying awake.  Patient confirms that he has taken Haldol  injection for maintenance therapy of schizophrenia.  Denies any current auditory, visual hallucinations or additional psychotic symptoms. Patient is currently in restraints and reports that he was placed in due to a fall earlier. ACTT Team Child psychotherapist and mother, state that he has been disorganized, bizaare and they suspect he may have been responding to internal stimuli as most recently as 1 month ago. The past 3 weeks he has been homeless. Given positive UDS will continue to consider substances role in psychosis vs decompensation of schizophrenia spectrum disorder.  We will continue to follow the patient and assess need for inpatient psychiatric hospitalization versus discharge from a psychiatric standpoint with follow-up with his ACT team.   Please see plan below for detailed recommendations.   Diagnoses:  Active Hospital problems: Principal Problem:   DKA (diabetic ketoacidosis) (HCC)   Plan  ## Safety and Observation Level:  - Based on my clinical evaluation, I estimate the patient to be at low risk of self harm in the current  setting - At this time, we recommend a routine level of observation. This decision is based on my review of the chart including patient's history and current presentation, interview of the patient, mental status examination, and consideration of suicide risk including evaluating suicidal ideation, plan, intent, suicidal or self-harm behaviors, risk factors, and protective factors. This judgment is based on our ability to directly address suicide risk, implement suicide prevention strategies and develop a safety plan while the patient is in the clinical setting. Please contact our team if there is a concern that risk level has changed.  ## Medications:  -- Home regimen: Haldol  Dec 100 mg q28days, last dispensed on 07/11/2024 -- will continue to assess while inpatient and consider adjustments as needed  -- Agitation Protocol:   Mild Haldol  5 mg and Benadryl  50 mg PO TID prn   Moderate Haldol  5 mg and Benadryl  50 mg IM TID prn   ## Medical Decision Making Capacity:  -- Not specifically addressed during assessment   ## Further Work-up:  -- Per primary team  -- most recent EKG on 07/29/2024 had QtC of 464 -- Pertinent labwork reviewed earlier this admission includes: BMP, CBGs,   ## Disposition:  -- TBD, at the current moment can't appropriately assess the patient given somnolence.  Will continue to assess patient for need for inpatient psychiatric hospitalization versus discharge with close follow-up with ACTT Team  -- If to discharge, contact [facility] ACTT Team at [phone]  to follow-up appropriately  ## Behavioral / Environmental:  -- Currently in soft restraints for falling  -- Recommend re-assessing need for restraints   ##Legal Status --  Unaware of any current legal issues  Thank you for this consult request. Recommendations have been communicated to the primary team.  We will follow at this time.   Tinnie FORBES Sierra, Medical Student   NEW History  Relevant Aspects of  Hospital Course:  Admitted on 07/29/2024 for altered mental status in the setting of hyperglycemia concerning for DKA.  Patient Report:   Was interviewed in his room at bedside with restraints on.  Patient is intermittently somnolent during exam and unable to stay fully awake and engaged to answer questioning.  Patient was able to state that he does get an Haldol  injection to help with schizophrenia/bipolar disorder.  He denies suicidal ideations, homicidal ideation or auditory visual hallucinations.  He states that his current regimen has been helpful and controlling his psychotic symptoms.  Patient intermittently became irritable and requested questioning on a different time when less sleepy.  Patient consented to speaking with mother for collateral information.  ROS:  Per HPI  Collateral information:  Jacob [name], mother, [phone]  She been intermittently homeless the last 2-3 years. She notes schizophrenia and bipolar was diagnosed at that time. She notes that he is currently being followed by ACTT services and they are attempting to help him get an apartment. She reports that he get his shot administered at [facility] earlier this month. She suspects at times he may be attempting to talk to people who are not there last week during a car ride with her. She notes mood lability and will be verbally aggressive with her at times. She reports that he has been losing his possessions (cell phone). He has been rejected by [employer], they are attempting to do a standard determining of health through [facility] (although he has been denied by 2 times in the past) and is working [employer] specialist to assist with housing. He has no SSI or SSDI. Mother reports that the social worker has arranged an appointment in October and November to try and gain financial assistance. She reports that he made SI statements about wanting to jump off a bridge a few weeks ago after going to ED.   Madeline, Social Work ACTT,   [phone]  She is concerned about his homelessness. She reports that he is currently disoriented to person, place, situation and location and doesn't recognize. She reports difficulty managing his diabetes given his schizophrenia. She reports that he has been out in the elements within the last 3 weeks. She states that he was mumbling to himself and could have been RIS 1 month ago.   Psychiatric History:  Information collected from EMR  The patient has a history of multiple inpatient psychiatric hospitalizations, with the most recent admission occurring from April 3 to February 09 2024, at the [facility]. He reports a past suicide attempt approximately five to six years ago, during which he ingested rat poison.   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 He reports that he is currently unemployed but has been approved for disability benefits for mental health and receives approximately $280 per month in EBT assistance. He identifies no one as a support system, states his religion is Christian-affiliated, and reports basketball as a hobby. He identifies as straight and male, denies any military background   Tobacco use: UTA  Alcohol use: UTA  Drug use: Cocaine and THC,  positive on UDS   Family History: per EMR  Family history of substance abuse, stating his mother was addicted to crack cocaine.  Family psych history: Unaware,   The patient's family history is not on file.  Medical History: Past Medical History:  Diagnosis Date  . Diabetes mellitus without complication [facility])     Surgical History: History reviewed. No pertinent surgical history.  Medications:   Current Facility-Administered Medications:  .  Chlorhexidine  Gluconate Cloth 2 % PADS 6 each, 6 each, Topical, Daily, Harris,  Whitney D, NP, 6 each at 07/31/24 [phone] .  dextrose  50 % solution 0-50 mL, 0-50 mL, Intravenous, PRN, Beam, William, MD .  haloperidol  (HALDOL ) tablet 5 mg, 5 mg, Oral, TID PRN **AND** diphenhydrAMINE  (BENADRYL ) capsule 50 mg, 50 mg, Oral, TID PRN, Lenard Calin, MD, 50 mg at 07/31/24 2044 .  diphenhydrAMINE  (BENADRYL ) injection 50 mg, 50 mg, Intramuscular, TID PRN **AND** haloperidol  lactate (HALDOL ) injection 5 mg, 5 mg, Intramuscular, TID PRN **AND** LORazepam  (ATIVAN ) injection 2 mg, 2 mg, Intramuscular, TID PRN, Lenard Calin, MD .  docusate sodium  (COLACE) capsule 100 mg, 100 mg, Oral, BID PRN, Harris, Whitney D, NP .  enoxaparin  (LOVENOX ) injection 40 mg, 40 mg, Subcutaneous, Q24H, Pham, Minh Q, [name]-CPP, 40 mg at 07/31/24 0942 .  insulin  aspart (novoLOG ) injection 0-20 Units, 0-20 Units, Subcutaneous, TID WC, Vernon Ranks, MD, 7 Units at 07/31/24 1754 .  insulin  aspart (novoLOG ) injection 0-5 Units, 0-5 Units, Subcutaneous, QHS, Pahwani, Ravi, MD, 2 Units at 07/31/24 2255 .  insulin  glargine (LANTUS ) injection 12 Units, 12 Units, Subcutaneous, BID, Pahwani, Ravi, MD .  linezolid (ZYVOX) tablet 600 mg, 600 mg, Oral, BID, Pham, Minh Q, [name]-CPP, 600 mg at 07/31/24 2042 .  nicotine  (NICODERM CQ  - dosed in mg/24 hours) patch 21 mg, 21 mg, Transdermal, Daily, Pahwani, Ravi, MD, 21 mg at 07/31/24 1442 .  Oral care mouth rinse, 15 mL, Mouth Rinse, PRN, Maree Harder, MD .  piperacillin-tazobactam (ZOSYN) IVPB 3.375 g, 3.375 g, Intravenous, Q8H, Pham, Minh Q, [name]-CPP, Last Rate: 12.5 mL/hr at 08/01/24 0413, 3.375 g at 08/01/24 0413 .  polyethylene glycol (MIRALAX  / GLYCOLAX ) packet 17 g, 17 g, Oral, Daily PRN, Harris, Whitney D, NP .  thiamine  (VITAMIN B1) 500 mg in sodium chloride  0.9 % 50 mL IVPB, 500 mg, Intravenous, TID, Arloa Folks D, NP, Last Rate: 110 mL/hr at 07/31/24 2204, 500 mg at 07/31/24 2204  Allergies: No Known Allergies     Objective  Vital signs:  Temp:  [97.9 F (36.6  C)-98.7 F (37.1 C)] 97.9 F (36.6 C) (10/01 0749) Pulse Rate:  [75-86] 75 (10/01 0749) Resp:  [10-20] 19 (10/01 0749) BP: (109-130)/(76-109) 123/90 (10/01 0749) SpO2:  [100 %] 100 % (10/01 0749) Weight:  [55 kg] 55 kg (10/01 0500)  Psychiatric Specialty Exam:  Presentation  General Appearance: Casual; Appropriate for Environment  Eye Contact:Minimal  Speech:Slow  Speech Volume:Decreased  Handedness:No data recorded  Mood and Affect  Mood:Irritable  Affect:Congruent   Thought Process  Thought Processes:Other (comment) (intermittently somnolent)  Descriptions of Associations:Intact  Orientation:Partial  Thought Content:Logical  History of Schizophrenia/Schizoaffective disorder:Yes  Duration of Psychotic Symptoms:Unable to determine  Hallucinations:Hallucinations: None  Ideas of Reference:None  Suicidal Thoughts:Suicidal Thoughts: No  Homicidal Thoughts:Homicidal Thoughts: No   Sensorium  Memory:Immediate Fair; Recent Poor  Judgment:Impaired  Insight:Shallow   Executive Functions  Concentration:Poor  Attention Span:Poor  Recall:Poor  Fund of Knowledge:Fair  Language:Fair   Psychomotor Activity  Psychomotor Activity:Psychomotor Activity: Decreased; Other (comment) (  intermittently somnolent)   Assets  Assets:Resilience   Sleep  Sleep:Sleep: -- (UTA)    Physical Exam: Physical Exam Constitutional:      Appearance: He is ill-appearing. He is not diaphoretic.  Pulmonary:     Effort: Pulmonary effort is normal.    Review of Systems  Psychiatric/Behavioral:  Negative for depression, hallucinations and suicidal ideas. The patient is not nervous/anxious and does not have insomnia.    Blood pressure (!) 123/90, pulse 75, temperature 97.9 F (36.6 C), temperature source Oral, resp. rate 19, height 5' 9 (1.753 m), weight 55 kg, SpO2 100%. Body mass index is 17.91 kg/m.

## 2024-08-02 ENCOUNTER — Telehealth: Payer: Self-pay

## 2024-08-02 ENCOUNTER — Inpatient Hospital Stay (HOSPITAL_COMMUNITY): Payer: MEDICAID

## 2024-08-02 DIAGNOSIS — F250 Schizoaffective disorder, bipolar type: Secondary | ICD-10-CM

## 2024-08-02 DIAGNOSIS — E1310 Other specified diabetes mellitus with ketoacidosis without coma: Secondary | ICD-10-CM | POA: Diagnosis not present

## 2024-08-02 DIAGNOSIS — F1490 Cocaine use, unspecified, uncomplicated: Secondary | ICD-10-CM

## 2024-08-02 DIAGNOSIS — F1290 Cannabis use, unspecified, uncomplicated: Secondary | ICD-10-CM

## 2024-08-02 LAB — CBC WITH DIFFERENTIAL/PLATELET
Abs Immature Granulocytes: 0.01 K/uL (ref 0.00–0.07)
Basophils Absolute: 0 K/uL (ref 0.0–0.1)
Basophils Relative: 1 %
Eosinophils Absolute: 0.2 K/uL (ref 0.0–0.5)
Eosinophils Relative: 3 %
HCT: 41.8 % (ref 39.0–52.0)
Hemoglobin: 13.7 g/dL (ref 13.0–17.0)
Immature Granulocytes: 0 %
Lymphocytes Relative: 24 %
Lymphs Abs: 1.5 K/uL (ref 0.7–4.0)
MCH: 28.8 pg (ref 26.0–34.0)
MCHC: 32.8 g/dL (ref 30.0–36.0)
MCV: 88 fL (ref 80.0–100.0)
Monocytes Absolute: 0.6 K/uL (ref 0.1–1.0)
Monocytes Relative: 9 %
Neutro Abs: 4 K/uL (ref 1.7–7.7)
Neutrophils Relative %: 63 %
Platelets: 223 K/uL (ref 150–400)
RBC: 4.75 MIL/uL (ref 4.22–5.81)
RDW: 14.1 % (ref 11.5–15.5)
WBC: 6.3 K/uL (ref 4.0–10.5)
nRBC: 0 % (ref 0.0–0.2)

## 2024-08-02 LAB — RESPIRATORY PANEL BY PCR

## 2024-08-02 LAB — BASIC METABOLIC PANEL WITH GFR
Anion gap: 11 (ref 5–15)
BUN: 15 mg/dL (ref 6–20)
CO2: 21 mmol/L — ABNORMAL LOW (ref 22–32)
Calcium: 9 mg/dL (ref 8.9–10.3)
Chloride: 100 mmol/L (ref 98–111)
Creatinine, Ser: 0.91 mg/dL (ref 0.61–1.24)
GFR, Estimated: >60 mL/min (ref >60)
Glucose, Bld: 413 mg/dL — ABNORMAL HIGH (ref 70–99)
Potassium: 4.3 mmol/L (ref 3.5–5.1)
Sodium: 132 mmol/L — ABNORMAL LOW (ref 135–145)

## 2024-08-02 LAB — GLUCOSE, CAPILLARY
Glucose-Capillary: 195 mg/dL — ABNORMAL HIGH (ref 70–99)
Glucose-Capillary: 252 mg/dL — ABNORMAL HIGH (ref 70–99)
Glucose-Capillary: 390 mg/dL — ABNORMAL HIGH (ref 70–99)
Glucose-Capillary: 215 mg/dL — ABNORMAL HIGH (ref 70–99)
Glucose-Capillary: 336 mg/dL — ABNORMAL HIGH (ref 70–99)

## 2024-08-02 MED ORDER — GUAIFENESIN-DM 100-10 MG/5ML PO SYRP
5.0000 mL | ORAL_SOLUTION | ORAL | Status: DC | PRN
  Administered 2024-08-02 (×2): 5 mL via ORAL
  Filled 2024-08-02: qty 5

## 2024-08-02 MED ORDER — INSULIN GLARGINE 100 UNIT/ML ~~LOC~~ SOLN
15.0000 [IU] | Freq: Two times a day (BID) | SUBCUTANEOUS | Status: DC
  Administered 2024-08-02 – 2024-08-03 (×4): 15 [IU] via SUBCUTANEOUS
  Filled 2024-08-02 (×3): qty 0.15

## 2024-08-02 NOTE — TOC Progression Note (Addendum)
 Transition of Care [facility]) - Progression Note    Patient Details  Name: Jacob [name] MRN: [id number] Date of Birth: [birth year]  Transition [facility]) CM/SW Contact  Luann SHAUNNA [place], [place] Phone Number: 08/02/2024, 10:42 AM  Clinical Narrative:     CSW contacted [facility] and [facility]; pt is being reviewed for inpatient psych. Inpatient Care Management(ICM) will continue to follow.   1200: [facility] had a bed for pt though pt had a fever. [facility] will delay admission. Inpatient care management(ICM) to continue to follow.    Expected Discharge Plan: Psychiatric Hospital           Social Drivers of Health (SDOH) Interventions SDOH Screenings   Food Insecurity: Food Insecurity Present (07/31/2024)  Housing: High Risk (07/31/2024)  Transportation Needs: Unmet Transportation Needs (07/31/2024)  Utilities: At Risk (07/31/2024)    Readmission Risk Interventions     No data to display

## 2024-08-02 NOTE — Telephone Encounter (Signed)
 Please refer to message below.  Copied from CRM [id number]. Topic: General - Other >> Aug 02, 2024  3:19 PM Graeme ORN wrote: Reason for CRM: Patient mother called. Would like a call back. Would like for Dr to go to hospital to check on him and see what's going on and how he can get SSI. States something did not work with form. Patient was denied. Paper she brought to office for SSI - Cody can not work due to current mental issues. Thank You

## 2024-08-02 NOTE — Inpatient Diabetes Management (Addendum)
 Inpatient Diabetes Program Recommendations  AACE/ADA: New Consensus Statement on Inpatient Glycemic Control   Target Ranges:  Prepandial:   less than 140 mg/dL      Peak postprandial:   less than 180 mg/dL (1-2 hours)      Critically ill patients:  140 - 180 mg/dL    Latest Reference Range & Units 08/01/24 08:36 08/01/24 11:36 08/01/24 16:33 08/01/24 16:36 08/01/24 19:36 08/01/24 22:03 08/01/24 23:13 08/02/24 03:26 08/02/24 08:38  Glucose-Capillary 70 - 99 mg/dL 832 (H) 647 (H) 887 (H) 108 (H) 202 (H) 199 (H) 247 (H) 195 (H) 252 (H)   Review of Glycemic Control  Diabetes history: DM1 Outpatient Diabetes medications: Tresiba  24 units daily, Novolog  7 units TID  Current orders for Inpatient glycemic control: Novolog  0-20 units TID & HS, Lantus  12 units BID, and Novolog  3 units TID with meals.    Inpatient Diabetes Program Recommendations:   Please consider changing correction to 0-15 units TID + HS and add Novolog  6 units TID with meals (if patient eats at least 50% of meals.  Thanks,  Lavanda Search, RN, MSN, [facility]  Inpatient Diabetes Coordinator  Pager [phone] (8a-5p)

## 2024-08-02 NOTE — Progress Notes (Signed)
 PROGRESS NOTE    Jacob [name]  FMW:[id number] DOB: 18-Mar-1991 DOA: 07/29/2024 PCP: Pcp, No   Brief Narrative:  Jacob [name] is a 33 year old gentleman with a history of type 1 diabetes mellitus who was found down by EMS unresponsive with undetectably high glucose per EMS. On ED arrival patient was seen hypothermic and tachycardic with decreased mentation. Preliminary blood work concerning for DKA was admitted under critical care team and was started on insulin  drip and managed DKA per protocol.  Eventually transferred under [facility] 07/31/2024.  Assessment & Plan:   Principal Problem:   DKA (diabetic ketoacidosis) (HCC) Active Problems:   Schizoaffective disorder, bipolar type (HCC)  DKA/type 1 diabetes mellitus: DKA resolved.  He is off of insulin  drip.  Currently on Lantus  12 units twice daily and NovoLog  3 units 3 times daily Premeal and SSI.  Blood sugar labile and elevated.  Will increase Lantus  to 15 units twice daily again starting tonight.   Encephalopathy secondary to DKA/history of schizoaffective disorder/psychosis: Encephalopathy resolved.  Patient was much more conversant, alert and oriented.  He was assessed by psychiatry yesterday and reevaluated today.  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  They have started him on medication, they were planning to admit him to psych unit today and he had a bed but unfortunately prior to discharge, he developed fever 101.1.  Not medically stable at the moment.  Appreciate psychiatry help.  Fever: Patient only complaint of dry cough.  No shortness of breath.  Repeat CBC shows no leukocytosis.  No other signs or symptoms of infection.  Will check respiratory viral panel.  Will observe overnight.  Discharge tomorrow if he remains afebrile.   Leukocytosis with lactic acidosis: Has been started on Zosyn  empirically.  Blood culture negative.  Patient has remained afebrile since admission.  Lactic acidosis has resolved.  Leukocytosis is improving.  Personally, I think his leukocytosis was due to severe dehydration causing severe hemoconcentration which has resolved.  We discontinued antibiotics.  Acute kidney injury/dehydration: Presented with creatinine of 4.94 secondary to severe DKA.  Improving improved with IV fluids.  Mild hyponatremia: Monitor.  Hypokalemia: Resolved.  DVT prophylaxis: enoxaparin  (LOVENOX ) injection 40 mg Start: 07/30/24 0900 SCDs Start: 07/29/24 1834   Code Status: Full Code  Family Communication: Mother present at bedside.  Plan of care discussed with patient in length and he/she verbalized understanding and agreed with it.  Status is: Inpatient Remains inpatient appropriate because: Needs admission to inpatient psych once afebrile.  Estimated body mass index is 17.91 kg/m as calculated from the following:   Height as of this encounter: 5' 9 (1.753 m).   Weight as of this encounter: 55 kg.    Nutritional Assessment: Body mass index is 17.91 kg/m.SABRA Seen by dietician.  I agree with the assessment and plan as outlined below: Nutrition Status:        . Skin Assessment: I have examined the patient's skin and I agree with the wound assessment as performed by the wound care RN as outlined below:    Consultants:  None  Procedures:  None  Antimicrobials:  Anti-infectives (From admission, onward)    Start     Dose/Rate Route Frequency Ordered Stop   07/31/24 0800  linezolid (ZYVOX) tablet 600 mg  Status:  Discontinued        600 mg Oral 2 times daily 07/30/24 0745 08/01/24 1327   07/30/24  0830  piperacillin-tazobactam (ZOSYN) IVPB 3.375 g  Status:  Discontinued        3.375 g 12.5 mL/hr over 240 Minutes Intravenous Every 8 hours 07/30/24 0740 08/01/24 1327   07/30/24 0200  piperacillin-tazobactam (ZOSYN) IVPB 2.25 g  Status:  Discontinued        2.25  g 100 mL/hr over 30 Minutes Intravenous Every 6 hours 07/29/24 2016 07/30/24 0740   07/29/24 2015  vancomycin variable dose per unstable renal function (pharmacist dosing)  Status:  Discontinued         Does not apply See admin instructions 07/29/24 2016 07/30/24 0745   07/29/24 1845  piperacillin-tazobactam (ZOSYN) IVPB 3.375 g        3.375 g 100 mL/hr over 30 Minutes Intravenous  Once 07/29/24 1831 07/29/24 1904   07/29/24 1830  vancomycin (VANCOREADY) IVPB 1500 mg/300 mL        1,500 mg 150 mL/hr over 120 Minutes Intravenous  Once 07/29/24 1819 07/29/24 2105         Subjective: Seen and examined, mother was at the bedside.  Patient's only complaint was dry cough.  No other complaint.  Objective: Vitals:   08/02/24 0336 08/02/24 0857 08/02/24 0900 08/02/24 1125  BP: 120/72 (!) 131/97 (!) 121/90 111/81  Pulse: 79 86 88 95  Resp: 16 17  (!) 21  Temp: 98.4 F (36.9 C) (!) 101.1 F (38.4 C) 98.6 F (37 C) 98.3 F (36.8 C)  TempSrc: Oral Oral Tympanic Oral  SpO2: 100% 100% 99% 100%  Weight:      Height:        Intake/Output Summary (Last 24 hours) at 08/02/2024 1307 Last data filed at 08/02/2024 0900 Gross per 24 hour  Intake --  Output 5550 ml  Net -5550 ml   Filed Weights   07/30/24 0500 07/31/24 0500 08/01/24 0500  Weight: 54.8 kg 54.7 kg 55 kg    Examination:  General exam: Appears calm and comfortable  Respiratory system: Clear to auscultation. Respiratory effort normal. Cardiovascular system: S1 & S2 heard, RRR. No JVD, murmurs, rubs, gallops or clicks. No pedal edema. Gastrointestinal system: Abdomen is nondistended, soft and nontender. No organomegaly or masses felt. Normal bowel sounds heard. Central nervous system: Alert and oriented. No focal neurological deficits. Extremities: Symmetric 5 x 5 power. Skin: No rashes, lesions or ulcers.     Data Reviewed: I have personally reviewed following labs and imaging studies  CBC: Recent Labs  Lab  07/29/24 1719 07/29/24 1724 07/29/24 1733 07/29/24 2118 07/30/24 0959 07/31/24 0154 08/01/24 0830 08/02/24 1120  WBC 34.1*  --  35.8*  --  23.3* 15.6* 7.9 6.3  NEUTROABS 31.7*  --  DUPLICATE REQUEST  SEE [id number]  --   --  13.0* 4.5 4.0  HGB 13.1   < > 13.0 13.3 12.4* 11.1* 12.5* 13.7  HCT 50.0   < > 48.7 39.0 36.0* 32.6* 38.1* 41.8  MCV 108.5*  --  108.2*  --  83.3 83.4 87.4 88.0  PLT 492*  --  468*  --  334 271 260 223   < > = values in this interval not displayed.   Basic Metabolic Panel: Recent Labs  Lab 07/29/24 2115 07/29/24 2118 07/30/24 0130 07/30/24 0959 07/31/24 0154 08/01/24 0830 08/02/24 1120  NA 142   < > 149* 153* 152* 140 132*  K 4.7   < > 4.1 3.6 2.9* 3.9 4.3  CL 104  --  111 110 108 101 100  CO2 10*  --  21* 29 33* 30 21*  GLUCOSE 710*  --  321* 171* 113* 108* 413*  BUN 39*  --  32* 26* 18 12 15   CREATININE 3.81*  --  2.50* 1.78* 1.28* 1.00 0.91  CALCIUM  11.6*  --  10.9* 9.9 9.4 9.3 9.0  MG 3.1*  --   --   --  1.9  --   --   PHOS 4.9*  --   --   --   --   --   --    < > = values in this interval not displayed.   GFR: Estimated Creatinine Clearance: 90.7 mL/min (by C-G formula based on SCr of 0.91 mg/dL). Liver Function Tests: Recent Labs  Lab 07/31/24 0154  AST 25  ALT 18  ALKPHOS 54  BILITOT 0.9  PROT 5.5*  ALBUMIN 3.0*   No results for input(s): LIPASE, AMYLASE in the last 168 hours. Recent Labs  Lab 07/29/24 2115  AMMONIA 48*   Coagulation Profile: Recent Labs  Lab 07/29/24 2115  INR 1.0   Cardiac Enzymes: Recent Labs  Lab 07/29/24 1719  CKTOTAL 145   BNP (last 3 results) No results for input(s): PROBNP in the last 8760 hours. HbA1C: No results for input(s): HGBA1C in the last 72 hours. CBG: Recent Labs  Lab 08/01/24 2203 08/01/24 2313 08/02/24 0326 08/02/24 0838 08/02/24 1126  GLUCAP 199* 247* 195* 252* 390*   Lipid Profile: No results for input(s): CHOL, HDL, LDLCALC, TRIG, CHOLHDL, LDLDIRECT  in the last 72 hours. Thyroid  Function Tests: No results for input(s): TSH, T4TOTAL, FREET4, T3FREE, THYROIDAB in the last 72 hours. Anemia Panel: No results for input(s): VITAMINB12, FOLATE, FERRITIN, TIBC, IRON, RETICCTPCT in the last 72 hours. Sepsis Labs: Recent Labs  Lab 07/29/24 1828 07/30/24 0959  LATICACIDVEN 7.2* 1.9    Recent Results (from the past 240 hours)  Blood Culture (routine x 2)     Status: None (Preliminary result)   Collection Time: 07/29/24  6:19 PM   Specimen: BLOOD LEFT FOREARM  Result Value Ref Range Status   Specimen Description BLOOD LEFT FOREARM  Final   Special Requests   Final    BOTTLES DRAWN AEROBIC AND ANAEROBIC Blood Culture results may not be optimal due to an inadequate volume of blood received in culture bottles   Culture   Final    NO GROWTH 4 DAYS Performed at [facility], [address] [place], [place] [address]    Report Status PENDING  Incomplete  Blood Culture (routine x 2)     Status: None (Preliminary result)   Collection Time: 07/29/24  6:24 PM   Specimen: BLOOD RIGHT ARM  Result Value Ref Range Status   Specimen Description BLOOD RIGHT ARM  Final   Special Requests   Final    BOTTLES DRAWN AEROBIC AND ANAEROBIC Blood Culture results may not be optimal due to an inadequate volume of blood received in culture bottles   Culture   Final    NO GROWTH 4 DAYS Performed at [facility], [address] [place], [place] [address]    Report Status PENDING  Incomplete  MRSA Next Gen by PCR, Nasal     Status: None   Collection Time: 07/29/24  6:34 PM   Specimen: Nasal Mucosa; Nasal Swab  Result Value Ref Range Status   MRSA by PCR Next Gen NOT DETECTED NOT DETECTED Final    Comment: (NOTE) The GeneXpert MRSA Assay (FDA approved for NASAL specimens only),  is one component of a comprehensive MRSA colonization surveillance program. It is not intended to diagnose MRSA infection nor to guide or monitor  treatment for MRSA infections. Test performance is not FDA approved in patients less than 40 years old. Performed at [facility], [address] [place], [place] [address]      Radiology Studies: No results found.   Scheduled Meds:  Chlorhexidine  Gluconate Cloth  6 each Topical Daily   enoxaparin  (LOVENOX ) injection  40 mg Subcutaneous Q24H   haloperidol   10 mg Oral QHS   insulin  aspart  0-20 Units Subcutaneous TID WC   insulin  aspart  0-5 Units Subcutaneous QHS   insulin  aspart  3 Units Subcutaneous TID WC   insulin  glargine  12 Units Subcutaneous BID   nicotine   21 mg Transdermal Daily   Continuous Infusions:  thiamine  (VITAMIN B1) injection 500 mg (08/02/24 1047)     LOS: 4 days   Fredia Skeeter, MD [facility]  08/02/2024, 1:07 PM   *Please note that this is a verbal dictation therefore any spelling or grammatical errors are due to the Dragon Medical One system interpretation.  Please page via Amion and do not message via secure chat for urgent patient care matters. Secure chat can be used for non urgent patient care matters.  How to contact the [facility] Attending or Consulting provider 7A - 7P or covering provider during after hours 7P -7A, for this patient?  Check the care team in [facility] and look for a) attending/consulting [facility] provider listed and b) the [facility] team listed. Page or secure chat 7A-7P. Log into [email or web address] and use [facility]'s universal password to access. If you do not have the password, please contact the hospital operator. Locate the [facility] provider you are looking for under [facility] and page to a number that you can be directly reached. If you still have difficulty reaching the provider, please page the [facility] (Director on Call) for the Hospitalists listed on amion for assistance.

## 2024-08-02 NOTE — Progress Notes (Signed)
 Physical Therapy Treatment Patient Details Name: Jacob [name] MRN: [id number] DOB: May 26, 1991 Today's Date: 08/02/2024   History of Present Illness 33 year old gentleman admitted 9/28 who was found down by EMS unresponsive with undetectably high glucose per EMS. On ED arrival patient was seen hypothermic and tachycardic with decreased mentation. with a history of type 1 diabetes mellitus    PT Comments  Pt received in supine and agreeable to session. Pt able to tolerate increased gait distance with some unsteadiness, but no LOB. Pt able to perform serial STS and BLE exercises with cues for technique and pt reporting increased fatigue. Pt continues to benefit from PT services to progress toward functional mobility goals.     If plan is discharge home, recommend the following: A little help with walking and/or transfers;A little help with bathing/dressing/bathroom;Assistance with cooking/housework;Direct supervision/assist for medications management;Direct supervision/assist for financial management;Assist for transportation;Supervision due to cognitive status   Can travel by private vehicle        Equipment Recommendations  None recommended by PT    Recommendations for Other Services       Precautions / Restrictions Precautions Precautions: Fall Recall of Precautions/Restrictions: Impaired Restrictions Weight Bearing Restrictions Per Provider Order: No     Mobility  Bed Mobility Overal bed mobility: Needs Assistance Bed Mobility: Supine to Sit, Sit to Supine     Supine to sit: Supervision Sit to supine: Supervision        Transfers Overall transfer level: Needs assistance Equipment used: None Transfers: Sit to/from Stand Sit to Stand: Supervision           General transfer comment: From EOB without UE support    Ambulation/Gait Ambulation/Gait assistance: Contact guard assist Gait Distance (Feet): 320 Feet Assistive device: None Gait Pattern/deviations:  Step-through pattern, Decreased stride length, Drifts right/left Gait velocity: dec     General Gait Details: Pt demonstrates some unsteadiness, but no overt LOB. CGA for safety. Low foot clearance despite cues   Stairs             Wheelchair Mobility     Tilt Bed    Modified Rankin (Stroke Patients Only)       Balance Overall balance assessment: Needs assistance Sitting-balance support: No upper extremity supported, Feet supported Sitting balance-Leahy Scale: Good Sitting balance - Comments: seated EOB   Standing balance support: No upper extremity supported, During functional activity Standing balance-Leahy Scale: Fair Standing balance comment: some unsteadiness, but no LOB. CGA for safety without AD                            Communication Communication Communication: No apparent difficulties  Cognition Arousal: Alert Behavior During Therapy: Flat affect   PT - Cognitive impairments: No family/caregiver present to determine baseline, Memory, Sequencing, Problem solving, Safety/Judgement, Awareness                         Following commands: Intact      Cueing Cueing Techniques: Verbal cues  Exercises General Exercises - Lower Extremity Long Arc Quad: AROM, Seated, Both, 5 reps Hip Flexion/Marching: AROM, Seated, Both, 5 reps Other Exercises Other Exercises: serial STS without UE support x10    General Comments General comments (skin integrity, edema, etc.): HR up to 117bpm with activity      Pertinent Vitals/Pain Pain Assessment Pain Assessment: No/denies pain     PT Goals (current goals can now be found in the care plan  section) Acute Rehab PT Goals Patient Stated Goal: Get well PT Goal Formulation: With patient Time For Goal Achievement: 08/14/24 Progress towards PT goals: Progressing toward goals    Frequency    Min 2X/week       AM-PAC PT 6 Clicks Mobility   Outcome Measure  Help needed turning from your  back to your side while in a flat bed without using bedrails?: None Help needed moving from lying on your back to sitting on the side of a flat bed without using bedrails?: A Little Help needed moving to and from a bed to a chair (including a wheelchair)?: A Little Help needed standing up from a chair using your arms (e.g., wheelchair or bedside chair)?: A Little Help needed to walk in hospital room?: A Little Help needed climbing 3-5 steps with a railing? : A Little 6 Click Score: 19    End of Session Equipment Utilized During Treatment: Gait belt Activity Tolerance: Patient tolerated treatment well Patient left: in bed;with call bell/phone within reach;with bed alarm set Nurse Communication: Mobility status PT Visit Diagnosis: Unsteadiness on feet (R26.81);Other abnormalities of gait and mobility (R26.89);Muscle weakness (generalized) (M62.81);Difficulty in walking, not elsewhere classified (R26.2);Other symptoms and signs involving the nervous system (R29.898)     Time: 1454-1510 PT Time Calculation (min) (ACUTE ONLY): 16 min  Charges:    $Gait Training: 8-22 mins PT General Charges $$ ACUTE PT VISIT: 1 Visit                    Darryle George, PTA Acute Rehabilitation Services Secure Chat Preferred  [phone]    Darryle George 08/02/2024, 3:37 PM

## 2024-08-02 NOTE — Consult Note (Signed)
 [facility] Psychiatry New Face-to-Face Psychiatric Evaluation  Service Date: August 02, 2024 LOS:  LOS: 4 days    Primary Psychiatric Diagnosis   Schizoaffective disorder, bipolar type, current episode manic Cocaine Use  Cannabis Use    Assessment  Jacob [name] is a 33 y.o. male admitted medically for 07/29/2024  5:16 PM for altered mental status in the setting of DKA. He carries the psychiatric diagnoses of schizoaffective disorder, bipolar type and has a past medical history of Type 1 Diabetes and Vitamin D  Deficiency.Psychiatry was consulted for optimization of medications and long term management.   The patient was initially scheduled for discharge and was accepted to [facility] for admission for ongoing care of his breakthrough psychosis which included disorganized thinking, delusional constructions and ongoing confusion that may be partly related to his underlying schizoaffective disorder and/or acute cocaine induced psychosis.  He was positive on admission to cocaine.  He is also currently unhoused and has not been in contact with his ACT team.  He recently received his long-acting injectable, Haldol  decanoate 100 mg IM on 07/09/2024.  The consideration is to increase his Haldol  decanoate from 100 to 150 mg depending on resolution of his psychotic symptoms.  Unfortunately just prior to his discharge, he developed fever and is being evaluated.  Admission to [facility] is pending his medical stabilization.  Please see plan below for detailed recommendations.   Diagnoses:  Active Hospital problems: Principal Problem:   DKA (diabetic ketoacidosis) (HCC) Active Problems:   Schizoaffective disorder, bipolar type (HCC)   Plan  ## Safety and Observation Level:  - Based on my clinical evaluation, I estimate the patient to be at low risk of self harm in the current setting - At this time, we recommend a routine level of observation. This decision is based on my review of the chart including  patient's history and current presentation, interview of the patient, mental status examination, and consideration of suicide risk including evaluating suicidal ideation, plan, intent, suicidal or self-harm behaviors, risk factors, and protective factors. This judgment is based on our ability to directly address suicide risk, implement suicide prevention strategies and develop a safety plan while the patient is in the clinical setting. Please contact our team if there is a concern that risk level has changed.  ## Medications:  -- Continue Haldol  10 mg at bedtime for persistent psychosis. Effective dose at this timeis 20 mg given previous Haldol  dec administration on 07/11/2024. If tolerating PO + IM dose well, can increase next scheduled haldol  decanoate (next due 08/08/2024) to 150 mg or 200 mg and discontinue PO regimen.  -- Home regimen: Haldol  Dec 100 mg q28days, last dispensed on 07/11/2024 -- Agitation Protocol:   Mild Haldol  5 mg and Benadryl  50 mg PO TID prn   Moderate Haldol  5 mg and Benadryl  50 mg IM TID prn   ## Medical Decision Making Capacity:  -- Not specifically addressed during assessment   ## Further Work-up:  -- Per primary team  -- most recent EKG on 07/29/2024 had QtC of 464 -- Pertinent labwork reviewed earlier this admission includes: BMP, CBGs,   ## Disposition:  -- Inpatient psychiatric hospitalization to titrate medications.  Patient was accepted to [facility] on 08/02/2024 but is currently undergoing an assessment for his fever of unknown origin.  Once this has been resolved he can be referred to psychiatry for further stabilization. -- If to discharge, contact [facility] ACTT Team at [phone]  to follow-up appropriately  ## Behavioral /  Environmental:  -- Recommend re-assessing need for restraints, patient was either waxing or back to baseline AM of 10/1.   ##Legal Status -- Unaware of any current legal issues  Thank you for this consult request. Recommendations  have been communicated to the primary team.  We will continue to follow at this time.   PAULETTE BEETS, MD   NEW History  Relevant Aspects of Hospital Course:  Admitted on 07/29/2024 for altered mental status in the setting of hyperglycemia concerning for DKA.  Patient Report:   On examination today, the patient was lying in bed and appeared to be pleasant and cooperative.  He maintained fair to good eye contact.  He recently had a chest x-ray and was resting.  He is able to communicate better.  He is oriented x 3.  He appears to be fairly lucid today.  He denies any confusion but endorses some disorganized thinking and paranoia.  He continues to be responding to some internal stimuli but denies any active SI/HI/AH VH. Amenable to inpatient psychiatric stay to optimize psychiatric medications. Patient will be admitted once he is cleared from a medical standpoint. AC notified.   ROS:  Per HPI  Collateral information:  [name], mother, [phone]  She been intermittently homeless the last 2-3 years. She notes schizophrenia and bipolar was diagnosed at that time. She notes that he is currently being followed by ACTT services and they are attempting to help him get an apartment. She reports that he get his shot administered at [facility] earlier this month. She suspects at times he may be attempting to talk to people who are not there last week during a car ride with her. She notes mood lability and will be verbally aggressive with her at times. She reports that he has been losing his possessions (cell phone). He has been rejected by [employer], they are attempting to do a standard determining of health through [facility] (although he has been denied by 2 times in the past) and is working [employer] specialist to assist with housing. He has no SSI or SSDI. Mother reports that the social worker has arranged an appointment in October and November to try and gain financial assistance. She reports that he  made SI statements about wanting to jump off a bridge a few weeks ago after going to ED.   Madeline, Social Work ACTT,  [phone]  She is concerned about his homelessness. She reports that he is currently disoriented to person, place, situation and location and doesn't recognize. She reports difficulty managing his diabetes given his schizophrenia. She reports that he has been out in the elements within the last 3 weeks. She states that he was mumbling to himself and could have been RIS 1 month ago.   Psychiatric History:  Information collected from EMR  The patient has a history of multiple inpatient psychiatric hospitalizations, with the most recent admission occurring from April 3 to February 09 2024, at the [facility]. He reports a past suicide attempt approximately five to six years ago, during which he ingested rat poison.   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 He reports that he is currently unemployed but has been approved for disability benefits for mental health and receives approximately $280 per month in EBT assistance. He identifies no one as a support system, states his  religion is Christian-affiliated, and reports basketball as a hobby. He identifies as straight and male, denies any military background   Tobacco use: UTA  Alcohol use: UTA  Drug use: Cocaine and THC, positive on UDS   Family History: per EMR  Family history of substance abuse, stating his mother was addicted to crack cocaine.  Family psych history: Unaware,   The patient's family history is not on file.  Medical History: Past Medical History:  Diagnosis Date   Diabetes mellitus without complication (HCC)     Surgical History: History reviewed. No pertinent surgical history.  Medications:   Current  Facility-Administered Medications:    Chlorhexidine  Gluconate Cloth 2 % PADS 6 each, 6 each, Topical, Daily, Harris, Whitney D, NP, 6 each at 08/01/24 0848   dextrose  50 % solution 0-50 mL, 0-50 mL, Intravenous, PRN, Beam, Elsie, MD   haloperidol  (HALDOL ) tablet 5 mg, 5 mg, Oral, TID PRN **AND** diphenhydrAMINE  (BENADRYL ) capsule 50 mg, 50 mg, Oral, TID PRN, Lenard Calin, MD, 50 mg at 07/31/24 2044   diphenhydrAMINE  (BENADRYL ) injection 50 mg, 50 mg, Intramuscular, TID PRN **AND** haloperidol  lactate (HALDOL ) injection 5 mg, 5 mg, Intramuscular, TID PRN **AND** LORazepam  (ATIVAN ) injection 2 mg, 2 mg, Intramuscular, TID PRN, Lenard Calin, MD   diphenhydrAMINE  (BENADRYL ) injection 50 mg, 50 mg, Intramuscular, Once PRN, Rollene Katz, MD   docusate sodium  (COLACE) capsule 100 mg, 100 mg, Oral, BID PRN, Arloa Folks D, NP   enoxaparin  (LOVENOX ) injection 40 mg, 40 mg, Subcutaneous, Q24H, Pham, Minh Q, [name]-CPP, 40 mg at 08/02/24 1000   guaiFENesin-dextromethorphan (ROBITUSSIN DM) 100-10 MG/5ML syrup 5 mL, 5 mL, Oral, Q4H PRN, Vernon Ranks, MD   haloperidol  (HALDOL ) tablet 10 mg, 10 mg, Oral, QHS, Rollene Katz, MD, 10 mg at 08/01/24 2120   insulin  aspart (novoLOG ) injection 0-20 Units, 0-20 Units, Subcutaneous, TID WC, Pahwani, Ranks, MD, 11 Units at 08/02/24 0930   insulin  aspart (novoLOG ) injection 0-5 Units, 0-5 Units, Subcutaneous, QHS, Pahwani, Ranks, MD, 2 Units at 07/31/24 2255   insulin  aspart (novoLOG ) injection 3 Units, 3 Units, Subcutaneous, TID WC, Vernon Ranks, MD, 3 Units at 08/02/24 0930   insulin  glargine (LANTUS ) injection 12 Units, 12 Units, Subcutaneous, BID, Vernon Ranks, MD, 12 Units at 08/02/24 1001   nicotine  (NICODERM CQ  - dosed in mg/24 hours) patch 21 mg, 21 mg, Transdermal, Daily, Pahwani, Ranks, MD, 21 mg at 08/02/24 1000   Oral care mouth rinse, 15 mL, Mouth Rinse, PRN, Maree Harder, MD   polyethylene glycol (MIRALAX  / GLYCOLAX ) packet 17 g, 17 g, Oral,  Daily PRN, Arloa, Whitney D, NP   thiamine  (VITAMIN B1) 500 mg in sodium chloride  0.9 % 50 mL IVPB, 500 mg, Intravenous, TID, Arloa Folks D, NP, Last Rate: 110 mL/hr at 08/02/24 1047, 500 mg at 08/02/24 1047  Allergies: No Known Allergies     Objective  Vital signs:  Temp:  [98.1 F (36.7 C)-101.1 F (38.4 C)] 98.3 F (36.8 C) (10/02 1125) Pulse Rate:  [72-95] 95 (10/02 1125) Resp:  [16-21] 21 (10/02 1125) BP: (111-131)/(72-97) 111/81 (10/02 1125) SpO2:  [96 %-100 %] 100 % (10/02 1125)  Psychiatric Specialty Exam:  Presentation  General Appearance: Appropriate for Environment  Eye Contact:Good  Speech:Clear and Coherent  Speech Volume:Normal  Handedness:No data recorded  Mood and Affect  Mood:Euthymic  Affect:Appropriate   Thought Process  Thought Processes:Disorganized; Irrevelant  Descriptions of Associations:Circumstantial  Orientation:Partial (self and hospital, not to month (september) or situation (reason for being in hospital))  Thought Content:Tangential; Delusions  History of Schizophrenia/Schizoaffective disorder:Yes  Duration of Psychotic Symptoms:Unable to determine  Hallucinations:Hallucinations: None  Ideas of Reference:None  Suicidal Thoughts:Suicidal Thoughts: No  Homicidal Thoughts:Homicidal Thoughts: No   Sensorium  Memory:Immediate Fair  Judgment:Fair  Insight:Fair   Executive Functions  Concentration:Fair  Attention Span:Fair  Recall:Fair  Fund of Knowledge:Fair  Language:Fair   Psychomotor Activity  Psychomotor Activity:Psychomotor Activity: Normal   Assets  Assets:Resilience   Sleep  Sleep:Sleep: Good    Physical Exam: Physical Exam Constitutional:      General: He is not in acute distress.    Appearance: He is not ill-appearing or diaphoretic.     Comments: Thin appearing  Pulmonary:     Effort: Pulmonary effort is normal. No respiratory distress.    Review of Systems   Psychiatric/Behavioral:  Negative for depression, hallucinations and suicidal ideas. The patient is not nervous/anxious and does not have insomnia.    Blood pressure 111/81, pulse 95, temperature 98.3 F (36.8 C), temperature source Oral, resp. rate (!) 21, height 5' 9 (1.753 m), weight 55 kg, SpO2 100%. Body mass index is 17.91 kg/m.

## 2024-08-03 ENCOUNTER — Other Ambulatory Visit (HOSPITAL_COMMUNITY): Payer: Self-pay

## 2024-08-03 ENCOUNTER — Inpatient Hospital Stay (HOSPITAL_COMMUNITY)
Admission: AD | Admit: 2024-08-03 | Discharge: 2024-08-10 | DRG: 885 | Disposition: A | Discharge status: Home / Self Care | Payer: MEDICAID | Source: Intra-hospital | Attending: Student in an Organized Health Care Education/Training Program | Admitting: Student in an Organized Health Care Education/Training Program

## 2024-08-03 ENCOUNTER — Encounter (HOSPITAL_COMMUNITY): Payer: Self-pay | Admitting: Family

## 2024-08-03 ENCOUNTER — Other Ambulatory Visit: Payer: Self-pay

## 2024-08-03 DIAGNOSIS — E1310 Other specified diabetes mellitus with ketoacidosis without coma: Secondary | ICD-10-CM | POA: Diagnosis not present

## 2024-08-03 DIAGNOSIS — F15959 Other stimulant use, unspecified with stimulant-induced psychotic disorder, unspecified: Secondary | ICD-10-CM

## 2024-08-03 DIAGNOSIS — F250 Schizoaffective disorder, bipolar type: Principal | ICD-10-CM | POA: Diagnosis present

## 2024-08-03 DIAGNOSIS — F332 Major depressive disorder, recurrent severe without psychotic features: Principal | ICD-10-CM | POA: Diagnosis present

## 2024-08-03 DIAGNOSIS — Z781 Physical restraint status: Secondary | ICD-10-CM

## 2024-08-03 DIAGNOSIS — N179 Acute kidney failure, unspecified: Secondary | ICD-10-CM | POA: Diagnosis present

## 2024-08-03 DIAGNOSIS — Z5941 Food insecurity: Secondary | ICD-10-CM | POA: Diagnosis not present

## 2024-08-03 DIAGNOSIS — Z79899 Other long term (current) drug therapy: Secondary | ICD-10-CM | POA: Diagnosis not present

## 2024-08-03 DIAGNOSIS — Z560 Unemployment, unspecified: Secondary | ICD-10-CM | POA: Diagnosis not present

## 2024-08-03 DIAGNOSIS — R680 Hypothermia, not associated with low environmental temperature: Secondary | ICD-10-CM | POA: Diagnosis present

## 2024-08-03 DIAGNOSIS — G9340 Encephalopathy, unspecified: Secondary | ICD-10-CM | POA: Diagnosis present

## 2024-08-03 DIAGNOSIS — Z794 Long term (current) use of insulin: Secondary | ICD-10-CM

## 2024-08-03 DIAGNOSIS — F1410 Cocaine abuse, uncomplicated: Secondary | ICD-10-CM | POA: Diagnosis present

## 2024-08-03 DIAGNOSIS — Z9151 Personal history of suicidal behavior: Secondary | ICD-10-CM

## 2024-08-03 DIAGNOSIS — Z5982 Transportation insecurity: Secondary | ICD-10-CM

## 2024-08-03 DIAGNOSIS — Z5900 Homelessness unspecified: Secondary | ICD-10-CM

## 2024-08-03 DIAGNOSIS — F19959 Other psychoactive substance use, unspecified with psychoactive substance-induced psychotic disorder, unspecified: Secondary | ICD-10-CM | POA: Diagnosis not present

## 2024-08-03 DIAGNOSIS — Z23 Encounter for immunization: Secondary | ICD-10-CM

## 2024-08-03 DIAGNOSIS — E1010 Type 1 diabetes mellitus with ketoacidosis without coma: Secondary | ICD-10-CM | POA: Diagnosis present

## 2024-08-03 LAB — GLUCOSE, CAPILLARY
Glucose-Capillary: 277 mg/dL — ABNORMAL HIGH (ref 70–99)
Glucose-Capillary: 301 mg/dL — ABNORMAL HIGH (ref 70–99)
Glucose-Capillary: 189 mg/dL — ABNORMAL HIGH (ref 70–99)
Glucose-Capillary: 252 mg/dL — ABNORMAL HIGH (ref 70–99)

## 2024-08-03 LAB — CULTURE, BLOOD (ROUTINE X 2)
Culture: NO GROWTH
Culture: NO GROWTH

## 2024-08-03 MED ORDER — DIPHENHYDRAMINE HCL 25 MG PO CAPS: 50.0000 mg | ORAL_CAPSULE | Freq: Three times a day (TID) | ORAL | Status: DC | PRN

## 2024-08-03 MED ORDER — INSULIN ASPART 100 UNIT/ML IJ SOLN
0.0000 [IU] | Freq: Three times a day (TID) | INTRAMUSCULAR | Status: DC
  Administered 2024-08-03 (×2): 4 [IU] via SUBCUTANEOUS
  Administered 2024-08-04: 3 [IU] via SUBCUTANEOUS
  Administered 2024-08-04: 15 [IU] via SUBCUTANEOUS
  Administered 2024-08-04: 20 [IU] via SUBCUTANEOUS
  Administered 2024-08-04: 15 [IU] via SUBCUTANEOUS
  Administered 2024-08-04: 3 [IU] via SUBCUTANEOUS
  Administered 2024-08-04: 20 [IU] via SUBCUTANEOUS
  Administered 2024-08-05: 4 [IU] via SUBCUTANEOUS
  Administered 2024-08-05 (×2): 15 [IU] via SUBCUTANEOUS
  Administered 2024-08-05: 4 [IU] via SUBCUTANEOUS
  Administered 2024-08-05 (×2): 15 [IU] via SUBCUTANEOUS
  Administered 2024-08-06: 20 [IU] via SUBCUTANEOUS
  Administered 2024-08-06 (×2): 11 [IU] via SUBCUTANEOUS
  Administered 2024-08-06 – 2024-08-07 (×2): 20 [IU] via SUBCUTANEOUS
  Administered 2024-08-07: 11 [IU] via SUBCUTANEOUS
  Administered 2024-08-07: 20 [IU] via SUBCUTANEOUS
  Administered 2024-08-07: 11 [IU] via SUBCUTANEOUS
  Administered 2024-08-08 (×4): 20 [IU] via SUBCUTANEOUS
  Administered 2024-08-09: 11 [IU] via SUBCUTANEOUS
  Administered 2024-08-09 (×2): 20 [IU] via SUBCUTANEOUS
  Administered 2024-08-09: 11 [IU] via SUBCUTANEOUS

## 2024-08-03 MED ORDER — HALOPERIDOL 5 MG PO TABS: 5.0000 mg | ORAL_TABLET | Freq: Three times a day (TID) | ORAL | Status: DC | PRN

## 2024-08-03 MED ORDER — INSULIN ASPART 100 UNIT/ML IJ SOLN
0.0000 [IU] | Freq: Every day | INTRAMUSCULAR | Status: DC
  Administered 2024-08-03 – 2024-08-04 (×4): 3 [IU] via SUBCUTANEOUS
  Administered 2024-08-07 (×2): 4 [IU] via SUBCUTANEOUS
  Administered 2024-08-09 (×2): 3 [IU] via SUBCUTANEOUS

## 2024-08-03 MED ORDER — HALOPERIDOL LACTATE 5 MG/ML IJ SOLN: 5.0000 mg | Freq: Three times a day (TID) | INTRAMUSCULAR | Status: DC | PRN

## 2024-08-03 MED ORDER — LORAZEPAM 2 MG/ML IJ SOLN: 2.0000 mg | Freq: Three times a day (TID) | INTRAMUSCULAR | Status: DC | PRN

## 2024-08-03 MED ORDER — ACETAMINOPHEN 325 MG PO TABS
650.0000 mg | ORAL_TABLET | Freq: Four times a day (QID) | ORAL | Status: DC | PRN
  Administered 2024-08-04 – 2024-08-07 (×6): 650 mg via ORAL
  Filled 2024-08-03 (×3): qty 2

## 2024-08-03 MED ORDER — ALUM & MAG HYDROXIDE-SIMETH 200-200-20 MG/5ML PO SUSP: 30.0000 mL | ORAL | Status: DC | PRN

## 2024-08-03 MED ORDER — INFLUENZA VIRUS VACC SPLIT PF (FLUZONE) 0.5 ML IM SUSY
0.5000 mL | PREFILLED_SYRINGE | INTRAMUSCULAR | Status: AC
  Administered 2024-08-04 (×2): 0.5 mL via INTRAMUSCULAR
  Filled 2024-08-03: qty 0.5

## 2024-08-03 MED ORDER — INSULIN GLARGINE 100 UNIT/ML SOLOSTAR PEN
18.0000 [IU] | PEN_INJECTOR | Freq: Two times a day (BID) | SUBCUTANEOUS | 0 refills | Status: DC
  Filled 2024-08-03 – 2024-08-16 (×3): qty 9, 25d supply, fill #0
  Filled ????-??-??: fill #0

## 2024-08-03 MED ORDER — HALOPERIDOL LACTATE 5 MG/ML IJ SOLN: 10.0000 mg | Freq: Three times a day (TID) | INTRAMUSCULAR | Status: DC | PRN

## 2024-08-03 MED ORDER — HALOPERIDOL 5 MG PO TABS
10.0000 mg | ORAL_TABLET | Freq: Every day | ORAL | Status: DC
  Administered 2024-08-03 – 2024-08-05 (×6): 10 mg via ORAL
  Filled 2024-08-03 (×3): qty 2

## 2024-08-03 MED ORDER — INSULIN ASPART 100 UNIT/ML FLEXPEN
6.0000 [IU] | PEN_INJECTOR | Freq: Three times a day (TID) | SUBCUTANEOUS | 0 refills | Status: DC
  Filled 2024-08-03: qty 9, 25d supply, fill #0

## 2024-08-03 MED ORDER — TRAZODONE HCL 50 MG PO TABS
50.0000 mg | ORAL_TABLET | Freq: Every day | ORAL | Status: DC
  Administered 2024-08-03 – 2024-08-09 (×16): 50 mg via ORAL
  Filled 2024-08-03 (×8): qty 1

## 2024-08-03 MED ORDER — DOCUSATE SODIUM 100 MG PO CAPS: 100.0000 mg | ORAL_CAPSULE | Freq: Two times a day (BID) | ORAL | Status: DC | PRN

## 2024-08-03 MED ORDER — DIPHENHYDRAMINE HCL 50 MG/ML IJ SOLN: 50.0000 mg | Freq: Three times a day (TID) | INTRAMUSCULAR | Status: DC | PRN

## 2024-08-03 MED ORDER — INSULIN ASPART 100 UNIT/ML IJ SOLN: 6.0000 [IU] | Freq: Three times a day (TID) | INTRAMUSCULAR | Status: DC

## 2024-08-03 MED ORDER — DM-GUAIFENESIN ER 30-600 MG PO TB12
1.0000 | ORAL_TABLET | Freq: Two times a day (BID) | ORAL | Status: DC | PRN
  Filled 2024-08-03 (×2): qty 1

## 2024-08-03 MED ORDER — NICOTINE 21 MG/24HR TD PT24
21.0000 mg | MEDICATED_PATCH | Freq: Every day | TRANSDERMAL | Status: DC
  Administered 2024-08-04 – 2024-08-08 (×8): 21 mg via TRANSDERMAL
  Filled 2024-08-03 (×6): qty 1

## 2024-08-03 MED ORDER — SALINE SPRAY 0.65 % NA SOLN
1.0000 | NASAL | Status: DC | PRN
Start: 2024-08-03 — End: 2024-08-10

## 2024-08-03 MED ORDER — INSULIN GLARGINE 100 UNIT/ML ~~LOC~~ SOLN
18.0000 [IU] | Freq: Two times a day (BID) | SUBCUTANEOUS | Status: DC
Start: 2024-08-03 — End: 2024-08-03
  Filled 2024-08-03: qty 0.18

## 2024-08-03 MED ORDER — BROMPHENIRAMINE-PHENYLEPHRINE 2-5 MG/10ML PO LIQD
10.0000 mL | Freq: Four times a day (QID) | ORAL | Status: DC | PRN
Start: 2024-08-03 — End: 2024-08-04

## 2024-08-03 NOTE — Plan of Care (Signed)

## 2024-08-03 NOTE — Plan of Care (Signed)
   Problem: Education: Goal: Emotional status will improve Outcome: Not Progressing Goal: Mental status will improve Outcome: Not Progressing Goal: Verbalization of understanding the information provided will improve Outcome: Not Progressing

## 2024-08-03 NOTE — Consult Note (Signed)
 [facility] Psychiatry New Face-to-Face Psychiatric Evaluation  Service Date: August 03, 2024 LOS:  LOS: 5 days    Primary Psychiatric Diagnosis   Schizoaffective disorder, bipolar type, current episode manic Cocaine Use  Cannabis Use    Assessment  Jacob [name] is a 33 y.o. male admitted medically for 07/29/2024  5:16 PM for altered mental status in the setting of DKA. He carries the psychiatric diagnoses of schizoaffective disorder, bipolar type and has a past medical history of Type 1 Diabetes and Vitamin D  Deficiency.Psychiatry was consulted for optimization of medications and long term management.   The patient was initially scheduled for discharge and was accepted to [facility] for admission for ongoing care of his breakthrough psychosis which included disorganized thinking, delusional constructions and ongoing confusion that may be partly related to his underlying schizoaffective disorder and/or acute cocaine induced psychosis.  He was positive on admission to cocaine.  He is also currently unhoused and has not been in contact with his ACT team.  He recently received his long-acting injectable, Haldol  decanoate 100 mg IM on 07/09/2024.  The consideration is to increase his Haldol  decanoate from 100 to 150 mg depending on resolution of his psychotic symptoms.  Unfortunately just prior to his discharge, he developed fever and is being evaluated.  Admission to [facility] is pending his medical stabilization.  Please see plan below for detailed recommendations.   Diagnoses:  Active Hospital problems: Principal Problem:   DKA (diabetic ketoacidosis) (HCC) Active Problems:   Schizoaffective disorder, bipolar type (HCC)   Plan  ## Safety and Observation Level:  - Based on my clinical evaluation, I estimate the patient to be at low risk of self harm in the current setting - At this time, we recommend a routine level of observation. This decision is based on my review of the chart including  patient's history and current presentation, interview of the patient, mental status examination, and consideration of suicide risk including evaluating suicidal ideation, plan, intent, suicidal or self-harm behaviors, risk factors, and protective factors. This judgment is based on our ability to directly address suicide risk, implement suicide prevention strategies and develop a safety plan while the patient is in the clinical setting. Please contact our team if there is a concern that risk level has changed.  ## Medications:  -- Continue Haldol  10 mg at bedtime for persistent psychosis. Effective dose at this timeis 20 mg given previous Haldol  dec administration on 07/11/2024. If tolerating PO + IM dose well, can increase next scheduled haldol  decanoate (next due 08/08/2024) to 150 mg or 200 mg and discontinue PO regimen.  -- Home regimen: Haldol  Dec 100 mg q28days, last dispensed on 07/11/2024 -- Agitation Protocol:   Mild Haldol  5 mg and Benadryl  50 mg PO TID prn   Moderate Haldol  5 mg and Benadryl  50 mg IM TID prn   ## Medical Decision Making Capacity:  -- Not specifically addressed during assessment   ## Further Work-up:  -- Per primary team  -- most recent EKG on 07/29/2024 had QtC of 464 -- Pertinent labwork reviewed earlier this admission includes: BMP, CBGs,   ## Disposition:  -- Inpatient psychiatric hospitalization to titrate medications.  Patient was accepted to [facility] on 08/02/2024 but is currently undergoing an assessment for his fever of unknown origin.  Once this has been resolved he can be referred to psychiatry for further stabilization. -- If to discharge, contact [facility] ACTT Team at [phone]  to follow-up appropriately  ## Behavioral /  Environmental:  -- Recommend re-assessing need for restraints, patient was either waxing or back to baseline AM of 10/1.   ##Legal Status -- Unaware of any current legal issues  Thank you for this consult request. Recommendations  have been communicated to the primary team.  We will continue to follow at this time.   Porfirio LITTIE Glatter, DO   NEW History  Relevant Aspects of Hospital Course:  Admitted on 07/29/2024 for altered mental status in the setting of hyperglycemia concerning for DKA.  Patient Report:   Patient seen laying in bed this morning on my approach. He reports that he is feeling fine and currently being treated for an infection. He states that he was initially admitted to the hospital for DKA. He has been compliant with his treatment and continues to plan for transfer to psychiatry once medically cleared. He denies any current SI/HI/AVH and does not report any paranoid thoughts about his treatment team.  Amenable to inpatient psychiatric stay to optimize psychiatric medications. Patient will be admitted once he is cleared from a medical standpoint. AC notified.   ROS:  Per HPI  Collateral information:  [name], mother, [phone]  She been intermittently homeless the last 2-3 years. She notes schizophrenia and bipolar was diagnosed at that time. She notes that he is currently being followed by ACTT services and they are attempting to help him get an apartment. She reports that he get his shot administered at [facility] earlier this month. She suspects at times he may be attempting to talk to people who are not there last week during a car ride with her. She notes mood lability and will be verbally aggressive with her at times. She reports that he has been losing his possessions (cell phone). He has been rejected by [facility], they are attempting to do a standard determining of health through [facility] (although he has been denied by 2 times in the past) and is working [employer] specialist to assist with housing. He has no SSI or SSDI. Mother reports that the social worker has arranged an appointment in October and November to try and gain financial assistance. She reports that he made SI statements about  wanting to jump off a bridge a few weeks ago after going to ED.   Madeline, Social Work ACTT,  [phone]  She is concerned about his homelessness. She reports that he is currently disoriented to person, place, situation and location and doesn't recognize. She reports difficulty managing his diabetes given his schizophrenia. She reports that he has been out in the elements within the last 3 weeks. She states that he was mumbling to himself and could have been RIS 1 month ago.   Psychiatric History:  Information collected from EMR  The patient has a history of multiple inpatient psychiatric hospitalizations, with the most recent admission occurring from April 3 to February 09 2024, at the [facility]. He reports a past suicide attempt approximately five to six years ago, during which he ingested rat poison.   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 He reports that he is currently unemployed but has been approved for disability benefits for mental health and receives approximately $280 per month in EBT assistance. He identifies no one as a support system, states his religion is Christian-affiliated, and reports basketball as a hobby. He identifies  as straight and male, denies any military background   Tobacco use: UTA  Alcohol use: UTA  Drug use: Cocaine and THC, positive on UDS   Family History: per EMR  Family history of substance abuse, stating his mother was addicted to crack cocaine.  Family psych history: Unaware,   The patient's family history is not on file.  Medical History: Past Medical History:  Diagnosis Date   Diabetes mellitus without complication (HCC)     Surgical History: History reviewed. No pertinent surgical history.  Medications:   Current Facility-Administered  Medications:    Chlorhexidine  Gluconate Cloth 2 % PADS 6 each, 6 each, Topical, Daily, Harris, Whitney D, NP, 6 each at 08/03/24 9077   dextrose  50 % solution 0-50 mL, 0-50 mL, Intravenous, PRN, Beam, Elsie, MD   haloperidol  (HALDOL ) tablet 5 mg, 5 mg, Oral, TID PRN **AND** diphenhydrAMINE  (BENADRYL ) capsule 50 mg, 50 mg, Oral, TID PRN, Lenard Calin, MD, 50 mg at 07/31/24 2044   diphenhydrAMINE  (BENADRYL ) injection 50 mg, 50 mg, Intramuscular, TID PRN **AND** haloperidol  lactate (HALDOL ) injection 5 mg, 5 mg, Intramuscular, TID PRN **AND** LORazepam  (ATIVAN ) injection 2 mg, 2 mg, Intramuscular, TID PRN, Lenard Calin, MD   diphenhydrAMINE  (BENADRYL ) injection 50 mg, 50 mg, Intramuscular, Once PRN, Rollene Katz, MD   docusate sodium  (COLACE) capsule 100 mg, 100 mg, Oral, BID PRN, Arloa Folks D, NP   enoxaparin  (LOVENOX ) injection 40 mg, 40 mg, Subcutaneous, Q24H, Pham, Minh Q, [name]-CPP, 40 mg at 08/03/24 0913   guaiFENesin-dextromethorphan (ROBITUSSIN DM) 100-10 MG/5ML syrup 5 mL, 5 mL, Oral, Q4H PRN, Vernon Ranks, MD, 5 mL at 08/02/24 1209   haloperidol  (HALDOL ) tablet 10 mg, 10 mg, Oral, QHS, Rollene Katz, MD, 10 mg at 08/02/24 2247   insulin  aspart (novoLOG ) injection 0-20 Units, 0-20 Units, Subcutaneous, TID WC, Vernon Ranks, MD, 11 Units at 08/03/24 0640   insulin  aspart (novoLOG ) injection 0-5 Units, 0-5 Units, Subcutaneous, QHS, Pahwani, Ranks, MD, 4 Units at 08/02/24 2247   insulin  aspart (novoLOG ) injection 3 Units, 3 Units, Subcutaneous, TID WC, Pahwani, Ranks, MD, 3 Units at 08/03/24 0913   insulin  glargine (LANTUS ) injection 15 Units, 15 Units, Subcutaneous, BID, Vernon Ranks, MD, 15 Units at 08/03/24 0913   nicotine  (NICODERM CQ  - dosed in mg/24 hours) patch 21 mg, 21 mg, Transdermal, Daily, Pahwani, Ranks, MD, 21 mg at 08/03/24 0913   Oral care mouth rinse, 15 mL, Mouth Rinse, PRN, Maree Harder, MD   polyethylene glycol (MIRALAX  / GLYCOLAX ) packet 17 g, 17 g, Oral,  Daily PRN, Harris, Whitney D, NP   thiamine  (VITAMIN B1) 500 mg in sodium chloride  0.9 % 50 mL IVPB, 500 mg, Intravenous, TID, Arloa Folks D, NP, Last Rate: 110 mL/hr at 08/03/24 0917, 500 mg at 08/03/24 9082  Allergies: No Known Allergies     Objective  Vital signs:  Temp:  [98 F (36.7 C)-98.9 F (37.2 C)] 98.3 F (36.8 C) (10/03 0735) Pulse Rate:  [83-95] 85 (10/03 0735) Resp:  [13-21] 13 (10/03 0735) BP: (105-119)/(64-89) 116/84 (10/03 0735) SpO2:  [97 %-100 %] 98 % (10/03 0735)  Psychiatric Specialty Exam:  Presentation  General Appearance: Appropriate for Environment  Eye Contact:Good  Speech:Clear and Coherent  Speech Volume:Normal  Handedness:No data recorded  Mood and Affect  Mood:Euthymic  Affect:Appropriate   Thought Process  Thought Processes: Linear  Descriptions of Associations:Circumstantial  Orientation:Partial (self and hospital, not to month (september) or situation (reason for being in hospital))  Thought Content: Denies SI/HI no paranoid  delusions  History of Schizophrenia/Schizoaffective disorder:Yes  Duration of Psychotic Symptoms:Unable to determine  Hallucinations: Denies  Ideas of Reference:None  Suicidal Thoughts: Denies  Homicidal Thoughts:Denies   Sensorium  Memory:Immediate Fair  Judgment:Fair  Insight:Fair   Executive Functions  Concentration:Fair  Attention Span:Fair  Recall:Fair  Fund of Knowledge:Fair  Language:Fair   Psychomotor Activity  Psychomotor Activity:No data recorded   Assets  Assets:Resilience   Sleep  Sleep:No data recorded    Physical Exam: Physical Exam Constitutional:      General: He is not in acute distress.    Appearance: He is not ill-appearing or diaphoretic.     Comments: Thin appearing  Pulmonary:     Effort: Pulmonary effort is normal. No respiratory distress.    Review of Systems  Psychiatric/Behavioral:  Negative for depression, hallucinations and  suicidal ideas. The patient is not nervous/anxious and does not have insomnia.    Blood pressure 116/84, pulse 85, temperature 98.3 F (36.8 C), temperature source Oral, resp. rate 13, height 5' 9 (1.753 m), weight 62.1 kg, SpO2 98%. Body mass index is 20.22 kg/m.

## 2024-08-03 NOTE — Inpatient Diabetes Management (Signed)
 Inpatient Diabetes Program Recommendations  AACE/ADA: New Consensus Statement on Inpatient Glycemic Control   Target Ranges:  Prepandial:   less than 140 mg/dL      Peak postprandial:   less than 180 mg/dL (1-2 hours)      Critically ill patients:  140 - 180 mg/dL   Lab Results  Component Value Date   GLUCAP 277 (H) 08/03/2024    Latest Reference Range & Units 08/02/24 08:38 08/02/24 11:26 08/02/24 16:23 08/02/24 21:23 08/03/24 06:16  Glucose-Capillary 70 - 99 mg/dL 747 (H) 609 (H) 784 (H) 336 (H) 277 (H)   Review of Glycemic Control  Diabetes history: DM1 Outpatient Diabetes medications: Tresiba  24 units daily, Novolog  7 units TID   Current orders for Inpatient glycemic control:  Novolog  0-20 units TID & HS Novolog  3 units TID with meals Lantus  15 units BID   Inpatient Diabetes Program Recommendations:    Please consider the following -   - Change Novolog  correction to 0-15 units TID + HS  - Increase Novolog  6 units TID with meals (if patient eats at least 50% of meals) - Increase Lantus  to 18 units BID   Thanks,  Lavanda Search, RN, MSN, [facility]  Inpatient Diabetes Coordinator  Pager [phone] (8a-5p)

## 2024-08-03 NOTE — Plan of Care (Signed)
   Problem: Education: Goal: Emotional status will improve Outcome: Progressing Goal: Mental status will improve Outcome: Progressing   Problem: Activity: Goal: Interest or engagement in activities will improve Outcome: Progressing Goal: Sleeping patterns will improve Outcome: Progressing   Problem: Coping: Goal: Ability to demonstrate self-control will improve Outcome: Progressing

## 2024-08-03 NOTE — Group Note (Signed)
 Date:  08/03/2024 Time:  8:38 PM  Group Topic/Focus:  Wrap-Up Group:   The focus of this group is to help patients review their daily goal of treatment and discuss progress on daily workbooks.    Participation Level:  Active  Participation Quality:  Appropriate  Affect:  Appropriate  Cognitive:  Appropriate  Insight: Appropriate  Engagement in Group:  Engaged  Modes of Intervention:  Education and Exploration  Additional Comments:  Patient attended and participated in group tonight. He reports that he came in the hospital today.  He has been sick but is feeling better. He was told to wear a mask when he is out of his room. He went to groups today. He has been feeling better today.  Gwenn Chillington Dacosta 08/03/2024, 8:38 PM

## 2024-08-03 NOTE — Progress Notes (Signed)
 08/03/2024  Jacob [name] DOB: [birth year] MRN: [id number]   RIDER WAIVER AND RELEASE OF LIABILITY  For the purposes of helping with transportation needs, [facility] partners with outside transportation providers (taxi companies, [place], Catering manager.) to give [facility] patients or other approved people the choice of on-demand rides Public librarian) to our buildings for non-emergency visits.  By using [employer], I, the person signing this document, on behalf of myself and/or any legal minors (in my care using the [employer]), agree:  Science writer given to me are supplied by independent, outside transportation providers who do not work for, or have any affiliation with, [employer]. [employer] is not a transportation company. [facility] has no control over the quality or safety of the rides I get using [employer]. [facility] has no control over whether any outside ride will happen on time or not. [facility] gives no guarantee on the reliability, quality, safety, or availability on any rides, or that no mistakes will happen. I know and accept that traveling by vehicle (car, truck, SVU, fleeta, bus, taxi, etc.) has risks of serious injuries such as disability, being paralyzed, and death. I know and agree the risk of using [employer] is mine alone, and not [employer]. [employer] are provided as is and as are available. The transportation providers are in charge for all inspections and care of the vehicles used to provide these rides. I agree not to take legal action against [facility], its agents, employees, officers, directors, representatives, insurers, attorneys, assigns, successors, subsidiaries, and affiliates at any time for any reasons related directly or indirectly to using [employer]. I also agree not to take legal action against [facility] or its affiliates for any injury, death, or damage to property caused by or related to using  [employer]. I have read this Waiver and Release of Liability, and I understand the terms used in it and their legal meaning. This Waiver is freely and voluntarily given with the understanding that my right (or any legal minors) to legal action against [facility] relating to [employer] is knowingly given up to use these services.   I attest that I read the Ride Waiver and Release of Liability to Jacob [name], gave Mr. Koziol the opportunity to ask questions and answered the questions asked (if any). I affirm that Jacob [name] then provided consent for assistance with transportation.

## 2024-08-03 NOTE — Progress Notes (Signed)
 PROGRESS NOTE    Jacob [name]  FMW:[id number] DOB: 01/23/1991 DOA: 07/29/2024 PCP: Pcp, No   Brief Narrative:  [name] is a 33 year old gentleman with a history of type 1 diabetes mellitus who was found down by EMS unresponsive with undetectably high glucose per EMS. On ED arrival patient was seen hypothermic and tachycardic with decreased mentation. Preliminary blood work concerning for DKA was admitted under critical care team and was started on insulin  drip and managed DKA per protocol.  Eventually transferred under [facility] 07/31/2024.  Assessment & Plan:   Principal Problem:   DKA (diabetic ketoacidosis) (HCC) Active Problems:   Schizoaffective disorder, bipolar type (HCC)  DKA/type 1 diabetes mellitus: DKA resolved.  He is off of insulin  drip.  Currently on Lantus  12 units twice daily and NovoLog  3 units 3 times daily Premeal and SSI.  Blood sugar labile and elevated.  Will increase Lantus  to 120 units twice daily and increase Premeal to 6 units.     Encephalopathy secondary to DKA/history of schizoaffective disorder/psychosis: Encephalopathy resolved.  Patient was much more conversant, alert and oriented.  He was assessed by psychiatry yesterday and reevaluated today.  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  They have started him on medication, they were planning to admit him to psych unit today and he had a bed but unfortunately prior to discharge, he developed fever 101.1.  Not medically stable at the moment.  Appreciate psychiatry help.  Fever/rhinovirus URI: Patient spiked fever of 101.1 on the morning of 08/02/2024.  Due to fever, his discharge to [facility] was canceled.  He was complaining of cough.  Check respiratory viral panel, tested positive for rhinovirus.  Chest x-ray unremarkable.  Treat symptomatically.     Leukocytosis with lactic  acidosis: Has been started on Zosyn empirically.  Blood culture negative.  Patient has remained afebrile since admission.  Lactic acidosis has resolved.  Leukocytosis is improving.  Personally, I think his leukocytosis was due to severe dehydration causing severe hemoconcentration which has resolved.  We discontinued antibiotics.  Acute kidney injury/dehydration: Presented with creatinine of 4.94 secondary to severe DKA.  Improving improved with IV fluids.  Mild hyponatremia: Monitor.  Hypokalemia: Resolved.  DVT prophylaxis: enoxaparin  (LOVENOX ) injection 40 mg Start: 07/30/24 0900 SCDs Start: 07/29/24 1834   Code Status: Full Code  Family Communication: None present at bedside.  Plan of care discussed with patient in length and he/she verbalized understanding and agreed with it.  Status is: Inpatient Remains inpatient appropriate because: Medically stable, pending placement to behavioral health.  Estimated body mass index is 20.22 kg/m as calculated from the following:   Height as of this encounter: 5' 9 (1.753 m).   Weight as of this encounter: 62.1 kg.    Nutritional Assessment: Body mass index is 20.22 kg/m.SABRA Seen by dietician.  I agree with the assessment and plan as outlined below: Nutrition Status:        . Skin Assessment: I have examined the patient's skin and I agree with the wound assessment as performed by the wound care RN as outlined below:    Consultants:  None  Procedures:  None  Antimicrobials:  Anti-infectives (From admission, onward)    Start     Dose/Rate Route Frequency Ordered Stop   07/31/24 0800  linezolid (ZYVOX) tablet 600 mg  Status:  Discontinued        600 mg Oral 2 times  daily 07/30/24 0745 08/01/24 1327   07/30/24 0830  piperacillin-tazobactam (ZOSYN) IVPB 3.375 g  Status:  Discontinued        3.375 g 12.5 mL/hr over 240 Minutes Intravenous Every 8 hours 07/30/24 0740 08/01/24 1327   07/30/24 0200  piperacillin-tazobactam (ZOSYN)  IVPB 2.25 g  Status:  Discontinued        2.25 g 100 mL/hr over 30 Minutes Intravenous Every 6 hours 07/29/24 2016 07/30/24 0740   07/29/24 2015  vancomycin variable dose per unstable renal function (pharmacist dosing)  Status:  Discontinued         Does not apply See admin instructions 07/29/24 2016 07/30/24 0745   07/29/24 1845  piperacillin-tazobactam (ZOSYN) IVPB 3.375 g        3.375 g 100 mL/hr over 30 Minutes Intravenous  Once 07/29/24 1831 07/29/24 1904   07/29/24 1830  vancomycin (VANCOREADY) IVPB 1500 mg/300 mL        1,500 mg 150 mL/hr over 120 Minutes Intravenous  Once 07/29/24 1819 07/29/24 2105         Subjective: Seen and examined.  Other than cough, no other complaint.  Feeling well.  Discussed with TOC that patient is now medically stable to admit to behavioral health.  I was informed that due to him testing positive for rhinovirus, he needs isolation room which [facility] is not able to offer at this point in time.  TOC is looking at other behavioral health options.  Objective: Vitals:   08/03/24 0010 08/03/24 0406 08/03/24 0735 08/03/24 1204  BP: 119/78 105/64 116/84 111/78  Pulse: 85 83 85 87  Resp: 18 18 13 16   Temp: 98 F (36.7 C) 98 F (36.7 C) 98.3 F (36.8 C) 99.7 F (37.6 C)  TempSrc: Oral  Oral Oral  SpO2: 99% 97% 98% 100%  Weight:      Height:        Intake/Output Summary (Last 24 hours) at 08/03/2024 1208 Last data filed at 08/02/2024 2300 Gross per 24 hour  Intake 1060 ml  Output 1400 ml  Net -340 ml   Filed Weights   07/31/24 0500 08/01/24 0500 08/02/24 0723  Weight: 54.7 kg 55 kg 62.1 kg    Examination:  General exam: Appears calm and comfortable  Respiratory system: Clear to auscultation. Respiratory effort normal. Cardiovascular system: S1 & S2 heard, RRR. No JVD, murmurs, rubs, gallops or clicks. No pedal edema. Gastrointestinal system: Abdomen is nondistended, soft and nontender. No organomegaly or masses felt. Normal bowel sounds  heard. Central nervous system: Alert and oriented. No focal neurological deficits. Extremities: Symmetric 5 x 5 power. Skin: No rashes, lesions or ulcers.    Data Reviewed: I have personally reviewed following labs and imaging studies  CBC: Recent Labs  Lab 07/29/24 1719 07/29/24 1724 07/29/24 1733 07/29/24 2118 07/30/24 0959 07/31/24 0154 08/01/24 0830 08/02/24 1120  WBC 34.1*  --  35.8*  --  23.3* 15.6* 7.9 6.3  NEUTROABS 31.7*  --  DUPLICATE REQUEST  SEE [id number]  --   --  13.0* 4.5 4.0  HGB 13.1   < > 13.0 13.3 12.4* 11.1* 12.5* 13.7  HCT 50.0   < > 48.7 39.0 36.0* 32.6* 38.1* 41.8  MCV 108.5*  --  108.2*  --  83.3 83.4 87.4 88.0  PLT 492*  --  468*  --  334 271 260 223   < > = values in this interval not displayed.   Basic Metabolic Panel: Recent Labs  Lab 07/29/24 2115  07/29/24 2118 07/30/24 0130 07/30/24 0959 07/31/24 0154 08/01/24 0830 08/02/24 1120  NA 142   < > 149* 153* 152* 140 132*  K 4.7   < > 4.1 3.6 2.9* 3.9 4.3  CL 104  --  111 110 108 101 100  CO2 10*  --  21* 29 33* 30 21*  GLUCOSE 710*  --  321* 171* 113* 108* 413*  BUN 39*  --  32* 26* 18 12 15   CREATININE 3.81*  --  2.50* 1.78* 1.28* 1.00 0.91  CALCIUM  11.6*  --  10.9* 9.9 9.4 9.3 9.0  MG 3.1*  --   --   --  1.9  --   --   PHOS 4.9*  --   --   --   --   --   --    < > = values in this interval not displayed.   GFR: Estimated Creatinine Clearance: 102.4 mL/min (by C-G formula based on SCr of 0.91 mg/dL). Liver Function Tests: Recent Labs  Lab 07/31/24 0154  AST 25  ALT 18  ALKPHOS 54  BILITOT 0.9  PROT 5.5*  ALBUMIN 3.0*   No results for input(s): LIPASE, AMYLASE in the last 168 hours. Recent Labs  Lab 07/29/24 2115  AMMONIA 48*   Coagulation Profile: Recent Labs  Lab 07/29/24 2115  INR 1.0   Cardiac Enzymes: Recent Labs  Lab 07/29/24 1719  CKTOTAL 145   BNP (last 3 results) No results for input(s): PROBNP in the last 8760 hours. HbA1C: No results for  input(s): HGBA1C in the last 72 hours. CBG: Recent Labs  Lab 08/02/24 1126 08/02/24 1623 08/02/24 2123 08/03/24 0616 08/03/24 1205  GLUCAP 390* 215* 336* 277* 301*   Lipid Profile: No results for input(s): CHOL, HDL, LDLCALC, TRIG, CHOLHDL, LDLDIRECT in the last 72 hours. Thyroid  Function Tests: No results for input(s): TSH, T4TOTAL, FREET4, T3FREE, THYROIDAB in the last 72 hours. Anemia Panel: No results for input(s): VITAMINB12, FOLATE, FERRITIN, TIBC, IRON, RETICCTPCT in the last 72 hours. Sepsis Labs: Recent Labs  Lab 07/29/24 1828 07/30/24 0959  LATICACIDVEN 7.2* 1.9    Recent Results (from the past 240 hours)  Blood Culture (routine x 2)     Status: None   Collection Time: 07/29/24  6:19 PM   Specimen: BLOOD LEFT FOREARM  Result Value Ref Range Status   Specimen Description BLOOD LEFT FOREARM  Final   Special Requests   Final    BOTTLES DRAWN AEROBIC AND ANAEROBIC Blood Culture results may not be optimal due to an inadequate volume of blood received in culture bottles   Culture   Final    NO GROWTH 5 DAYS Performed at [facility], [address] [place], [place] [address]    Report Status 08/03/2024 FINAL  Final  Blood Culture (routine x 2)     Status: None   Collection Time: 07/29/24  6:24 PM   Specimen: BLOOD RIGHT ARM  Result Value Ref Range Status   Specimen Description BLOOD RIGHT ARM  Final   Special Requests   Final    BOTTLES DRAWN AEROBIC AND ANAEROBIC Blood Culture results may not be optimal due to an inadequate volume of blood received in culture bottles   Culture   Final    NO GROWTH 5 DAYS Performed at [facility], [address] [place], [place] [address]    Report Status 08/03/2024 FINAL  Final  MRSA Next Gen by PCR, Nasal  Status: None   Collection Time: 07/29/24  6:34 PM   Specimen: Nasal Mucosa; Nasal Swab  Result Value Ref Range Status   MRSA by PCR Next Gen NOT DETECTED NOT DETECTED  Final    Comment: (NOTE) The GeneXpert MRSA Assay (FDA approved for NASAL specimens only), is one component of a comprehensive MRSA colonization surveillance program. It is not intended to diagnose MRSA infection nor to guide or monitor treatment for MRSA infections. Test performance is not FDA approved in patients less than 76 years old. Performed at [facility], [address], [place], [place] [address]   Respiratory (~20 pathogens) panel by PCR     Status: Abnormal   Collection Time: 08/02/24  5:00 PM   Specimen: Nasopharyngeal Swab; Respiratory  Result Value Ref Range Status   Adenovirus NOT DETECTED NOT DETECTED Final   Coronavirus 229E NOT DETECTED NOT DETECTED Final    Comment: (NOTE) The Coronavirus on the Respiratory Panel, DOES NOT test for the novel  Coronavirus (2019 nCoV)    Coronavirus HKU1 NOT DETECTED NOT DETECTED Final   Coronavirus NL63 NOT DETECTED NOT DETECTED Final   Coronavirus OC43 NOT DETECTED NOT DETECTED Final   Metapneumovirus NOT DETECTED NOT DETECTED Final   Rhinovirus / Enterovirus DETECTED (A) NOT DETECTED Final   Influenza A NOT DETECTED NOT DETECTED Final   Influenza B NOT DETECTED NOT DETECTED Final   Parainfluenza Virus 1 NOT DETECTED NOT DETECTED Final   Parainfluenza Virus 2 NOT DETECTED NOT DETECTED Final   Parainfluenza Virus 3 NOT DETECTED NOT DETECTED Final   Parainfluenza Virus 4 NOT DETECTED NOT DETECTED Final   Respiratory Syncytial Virus NOT DETECTED NOT DETECTED Final   Bordetella pertussis NOT DETECTED NOT DETECTED Final   Bordetella Parapertussis NOT DETECTED NOT DETECTED Final   Chlamydophila pneumoniae NOT DETECTED NOT DETECTED Final   Mycoplasma pneumoniae NOT DETECTED NOT DETECTED Final    Comment: Performed at [facility], [address], [place], [place] [address]     Radiology Studies: DG CHEST PORT 1 VIEW Result Date: 08/02/2024 CLINICAL DATA:  [id number] Pneumonia [id number]. EXAM: PORTABLE CHEST 1 VIEW  COMPARISON:  07/29/2024. FINDINGS: Bilateral lung fields are clear. Bilateral costophrenic angles are clear. Normal cardio-mediastinal silhouette. No acute osseous abnormalities. The soft tissues are within normal limits. IMPRESSION: No active disease. Electronically Signed   By: Ree Molt M.D.   On: 08/02/2024 15:35     Scheduled Meds:  Chlorhexidine  Gluconate Cloth  6 each Topical Daily   enoxaparin  (LOVENOX ) injection  40 mg Subcutaneous Q24H   haloperidol   10 mg Oral QHS   insulin  aspart  0-20 Units Subcutaneous TID WC   insulin  aspart  0-5 Units Subcutaneous QHS   insulin  aspart  6 Units Subcutaneous TID WC   insulin  glargine  18 Units Subcutaneous BID   nicotine   21 mg Transdermal Daily   Continuous Infusions:  thiamine  (VITAMIN B1) injection 500 mg (08/03/24 0917)     LOS: 5 days   Fredia Skeeter, MD [facility]  08/03/2024, 12:08 PM   *Please note that this is a verbal dictation therefore any spelling or grammatical errors are due to the Dragon Medical One system interpretation.  Please page via Amion and do not message via secure chat for urgent patient care matters. Secure chat can be used for non urgent patient care matters.  How to contact the [facility] Attending or Consulting provider 7A - 7P or covering provider during after hours 7P -7A, for  this patient?  Check the care team in [facility] and look for a) attending/consulting [facility] provider listed and b) the [facility] team listed. Page or secure chat 7A-7P. Log into [email or web address] and use [facility]'s universal password to access. If you do not have the password, please contact the hospital operator. Locate the [facility] provider you are looking for under [facility] and page to a number that you can be directly reached. If you still have difficulty reaching the provider, please page the [facility] (Director on Call) for the Hospitalists listed on amion for assistance.

## 2024-08-03 NOTE — Discharge Summary (Signed)
 Physician Discharge Summary  Jacob [name] FMW:[id number] DOB: 04/10/1991 DOA: 07/29/2024  PCP: Pcp, No  Admit date: 07/29/2024 Discharge date: 08/03/2024 30 Day Unplanned Readmission Risk Score    Flowsheet Row ED to Hosp-Admission (Current) from 07/29/2024 in [facility]  30 Day Unplanned Readmission Risk Score (%) 12.45 Filed at 08/03/2024 1200    This score is the patient's risk of an unplanned readmission within 30 days of being discharged (0 -100%). The score is based on dignosis, age, lab data, medications, orders, and past utilization.   Low:  0-14.9   Medium: 15-21.9   High: 22-29.9   Extreme: 30 and above          Admitted From: Home/Street Disposition: Behavioral health  Recommendations for Outpatient Follow-up:  Follow up with PCP in 1-2 weeks Please obtain BMP/CBC in one week Please follow up with your PCP on the following pending results: Unresulted Labs (From admission, onward)    None         Home Health: None Equipment/Devices: None  Discharge Condition:  stable CODE STATUS: Full code Diet recommendation:  Diet Order             Diet Carb Modified Fluid consistency: Thin; Room service appropriate? Yes  Diet effective now                   Subjective: Seen and examined, he has no complaints other than cough.  Brief/Interim Summary: [name] is a 33 year old gentleman with a history of type 1 diabetes mellitus who was found down by EMS unresponsive with undetectably high glucose per EMS. On ED arrival patient was seen hypothermic and tachycardic with decreased mentation. Preliminary blood work concerning for DKA was admitted under critical care team and was started on insulin  drip and managed DKA per protocol.  Eventually transferred under [facility] 07/31/2024.   DKA/type 1 diabetes mellitus: DKA resolved.  He is off of insulin  drip.  Currently on Lantus  12 units twice daily and NovoLog  3 units 3 times daily Premeal and SSI.  Blood  sugar labile and elevated.  Will increase Lantus  to 20 units twice daily and increase Premeal to 6 units.     Encephalopathy secondary to DKA/history of schizoaffective disorder/psychosis: Encephalopathy resolved.  Patient was much more conversant, alert and oriented.  He was assessed by psychiatry yesterday and reevaluated today.  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  They recommended admission to psychiatry unit.  Patient is being discharged stable condition.  While on the medicine inpatient unit, patient was being treated with as needed Haldol .  All his PTA psych medications were held since he was likely noncompliant.  Will defer to psychiatry to adjust medications and reinitiate what ever they feel is appropriate.   Fever/rhinovirus URI: Patient spiked fever of 101.1 on the morning of 08/02/2024.  Due to fever, his discharge to [facility] was canceled.  He was complaining of cough.  Check respiratory viral panel, tested positive for rhinovirus.  Chest x-ray unremarkable.  Treat symptomatically.     Leukocytosis with lactic acidosis: Has been started on Zosyn empirically.  Blood culture negative.  Patient has remained afebrile since admission.  Lactic acidosis has resolved.  Leukocytosis is improving.  Personally, I think his leukocytosis was due to severe dehydration causing severe hemoconcentration which has resolved.  We discontinued antibiotics.   Acute kidney injury/dehydration: Presented with creatinine of 4.94  secondary to severe DKA.  Improving improved with IV fluids.   Mild hyponatremia: Monitor.   Hypokalemia: Resolved.  Discharge plan was discussed with patient and/or family member and they verbalized understanding and agreed with it.  Discharge Diagnoses:  Principal Problem:   DKA (diabetic ketoacidosis) (HCC) Active Problems:   Schizoaffective  disorder, bipolar type [facility])    Discharge Instructions   Allergies as of 08/03/2024   No Known Allergies      Medication List     STOP taking these medications    benztropine  0.5 MG tablet Commonly known as: COGENTIN    Haldol  Decanoate 100 MG/ML injection Generic drug: haloperidol  decanoate   hydrOXYzine  25 MG tablet Commonly known as: ATARAX    Lantus  SoloStar 100 UNIT/ML Solostar Pen Generic drug: insulin  glargine Replaced by: insulin  glargine 100 UNIT/ML injection   NovoLOG  FlexPen 100 UNIT/ML FlexPen Generic drug: insulin  aspart Replaced by: insulin  aspart 100 UNIT/ML injection   traZODone  50 MG tablet Commonly known as: DESYREL    Vitamin D  (Ergocalciferol ) 1.25 MG (50000 UNIT) Caps capsule Commonly known as: DRISDOL        TAKE these medications    insulin  aspart 100 UNIT/ML injection Commonly known as: novoLOG  Inject 6 Units into the skin 3 (three) times daily with meals. Replaces: NovoLOG  FlexPen 100 UNIT/ML FlexPen   insulin  glargine 100 UNIT/ML injection Commonly known as: LANTUS  Inject 0.18 mLs (18 Units total) into the skin 2 (two) times daily. Replaces: Lantus  SoloStar 100 UNIT/ML Solostar Pen        Follow-up Information     pcp Follow up in 1 week(s).                 No Known Allergies  Consultations: Psychiatry and critical care   Procedures/Studies: DG CHEST PORT 1 VIEW Result Date: 08/02/2024 CLINICAL DATA:  [id number] Pneumonia 872214. EXAM: PORTABLE CHEST 1 VIEW COMPARISON:  07/29/2024. FINDINGS: Bilateral lung fields are clear. Bilateral costophrenic angles are clear. Normal cardio-mediastinal silhouette. No acute osseous abnormalities. The soft tissues are within normal limits. IMPRESSION: No active disease. Electronically Signed   By: Ree Molt M.D.   On: 08/02/2024 15:35   CT Cervical Spine Wo Contrast Result Date: 07/29/2024 CLINICAL DATA:  Neck trauma, found down EXAM: CT CERVICAL SPINE WITHOUT CONTRAST TECHNIQUE:  Multidetector CT imaging of the cervical spine was performed without intravenous contrast. Multiplanar CT image reconstructions were also generated. RADIATION DOSE REDUCTION: This exam was performed according to the departmental dose-optimization program which includes automated exposure control, adjustment of the mA and/or kV according to patient size and/or use of iterative reconstruction technique. COMPARISON:  None Available. FINDINGS: Alignment: Mild left convex curvature of the cervical spine, likely positional. Otherwise alignment is anatomic. Skull base and vertebrae: No acute fracture. No primary bone lesion or focal pathologic process. Soft tissues and spinal canal: No prevertebral fluid or swelling. No visible canal hematoma. Disc levels:  No significant spondylosis or facet hypertrophy. Upper chest: Airway is patent.  Lung apices are clear. Other: Reconstructed images demonstrate no additional findings. IMPRESSION: 1. No acute cervical spine fracture. Electronically Signed   By: Ozell Daring M.D.   On: 07/29/2024 20:03   CT Head Wo Contrast Result Date: 07/29/2024 CLINICAL DATA:  Found down, altered level of consciousness EXAM: CT HEAD WITHOUT CONTRAST TECHNIQUE: Contiguous axial images were obtained from the base of the skull through the vertex without intravenous contrast. RADIATION DOSE REDUCTION: This exam was performed according to the departmental dose-optimization program which includes automated exposure control, adjustment  of the mA and/or kV according to patient size and/or use of iterative reconstruction technique. COMPARISON:  05/28/2024 FINDINGS: Brain: No acute infarct or hemorrhage. Lateral ventricles are unremarkable. 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 Remaining midline structures are unremarkable. No acute extra-axial fluid collections. No mass effect. Vascular: No hyperdense vessel or unexpected calcification. Skull: Normal. Negative for fracture or  focal lesion. Sinuses/Orbits: No acute finding. Other: None. IMPRESSION: 1. No acute intracranial process. 2. Stable colloid cyst. Electronically Signed   By: Ozell Daring M.D.   On: 07/29/2024 20:01   DG Chest Portable 1 View Result Date: 07/29/2024 EXAM: 1 VIEW(S) XRAY OF THE CHEST 07/29/2024 05:39:00 PM COMPARISON: None available. CLINICAL HISTORY: ams. AMS; Hyperglycemia FINDINGS: LUNGS AND PLEURA: No focal pulmonary opacity. No pulmonary edema. No pleural effusion. No pneumothorax. HEART AND MEDIASTINUM: No acute abnormality of the cardiac and mediastinal silhouettes. BONES AND SOFT TISSUES: Overlying leads noted. No acute osseous abnormality. IMPRESSION: 1. No acute abnormalities. Electronically signed by: Norman Gatlin MD 07/29/2024 06:02 PM EDT RP Workstation: [id number]     Discharge Exam: Vitals:   08/03/24 0735 08/03/24 1204  BP: 116/84 111/78  Pulse: 85 87  Resp: 13 16  Temp: 98.3 F (36.8 C) 99.7 F (37.6 C)  SpO2: 98% 100%   Vitals:   08/03/24 0010 08/03/24 0406 08/03/24 0735 08/03/24 1204  BP: 119/78 105/64 116/84 111/78  Pulse: 85 83 85 87  Resp: 18 18 13 16   Temp: 98 F (36.7 C) 98 F (36.7 C) 98.3 F (36.8 C) 99.7 F (37.6 C)  TempSrc: Oral  Oral Oral  SpO2: 99% 97% 98% 100%  Weight:      Height:        General: Pt is alert, awake, not in acute distress Cardiovascular: RRR, S1/S2 +, no rubs, no gallops Respiratory: CTA bilaterally, no wheezing, no rhonchi Abdominal: Soft, NT, ND, bowel sounds + Extremities: no edema, no cyanosis    The results of significant diagnostics from this hospitalization (including imaging, microbiology, ancillary and laboratory) are listed below for reference.     Microbiology: Recent Results (from the past 240 hours)  Blood Culture (routine x 2)     Status: None   Collection Time: 07/29/24  6:19 PM   Specimen: BLOOD LEFT FOREARM  Result Value Ref Range Status   Specimen Description BLOOD LEFT FOREARM  Final   Special  Requests   Final    BOTTLES DRAWN AEROBIC AND ANAEROBIC Blood Culture results may not be optimal due to an inadequate volume of blood received in culture bottles   Culture   Final    NO GROWTH 5 DAYS Performed at [facility], [address], [place], [place] [address]    Report Status 08/03/2024 FINAL  Final  Blood Culture (routine x 2)     Status: None   Collection Time: 07/29/24  6:24 PM   Specimen: BLOOD RIGHT ARM  Result Value Ref Range Status   Specimen Description BLOOD RIGHT ARM  Final   Special Requests   Final    BOTTLES DRAWN AEROBIC AND ANAEROBIC Blood Culture results may not be optimal due to an inadequate volume of blood received in culture bottles   Culture   Final    NO GROWTH 5 DAYS Performed at [facility], [address] [place], [place] [address]    Report Status 08/03/2024 FINAL  Final  MRSA Next Gen by PCR, Nasal  Status: None   Collection Time: 07/29/24  6:34 PM   Specimen: Nasal Mucosa; Nasal Swab  Result Value Ref Range Status   MRSA by PCR Next Gen NOT DETECTED NOT DETECTED Final    Comment: (NOTE) The GeneXpert MRSA Assay (FDA approved for NASAL specimens only), is one component of a comprehensive MRSA colonization surveillance program. It is not intended to diagnose MRSA infection nor to guide or monitor treatment for MRSA infections. Test performance is not FDA approved in patients less than 69 years old. Performed at [facility], [address], [place], [place] [address]   Respiratory (~20 pathogens) panel by PCR     Status: Abnormal   Collection Time: 08/02/24  5:00 PM   Specimen: Nasopharyngeal Swab; Respiratory  Result Value Ref Range Status   Adenovirus NOT DETECTED NOT DETECTED Final   Coronavirus 229E NOT DETECTED NOT DETECTED Final    Comment: (NOTE) The Coronavirus on the Respiratory Panel, DOES NOT test for the novel  Coronavirus (2019 nCoV)    Coronavirus HKU1 NOT DETECTED NOT DETECTED Final   Coronavirus  NL63 NOT DETECTED NOT DETECTED Final   Coronavirus OC43 NOT DETECTED NOT DETECTED Final   Metapneumovirus NOT DETECTED NOT DETECTED Final   Rhinovirus / Enterovirus DETECTED (A) NOT DETECTED Final   Influenza A NOT DETECTED NOT DETECTED Final   Influenza B NOT DETECTED NOT DETECTED Final   Parainfluenza Virus 1 NOT DETECTED NOT DETECTED Final   Parainfluenza Virus 2 NOT DETECTED NOT DETECTED Final   Parainfluenza Virus 3 NOT DETECTED NOT DETECTED Final   Parainfluenza Virus 4 NOT DETECTED NOT DETECTED Final   Respiratory Syncytial Virus NOT DETECTED NOT DETECTED Final   Bordetella pertussis NOT DETECTED NOT DETECTED Final   Bordetella Parapertussis NOT DETECTED NOT DETECTED Final   Chlamydophila pneumoniae NOT DETECTED NOT DETECTED Final   Mycoplasma pneumoniae NOT DETECTED NOT DETECTED Final    Comment: Performed at [facility], [address], [place], [place] [address]     Labs: BNP (last 3 results) No results for input(s): BNP in the last 8760 hours. Basic Metabolic Panel: Recent Labs  Lab 07/29/24 2115 07/29/24 2118 07/30/24 0130 07/30/24 0959 07/31/24 0154 08/01/24 0830 08/02/24 1120  NA 142   < > 149* 153* 152* 140 132*  K 4.7   < > 4.1 3.6 2.9* 3.9 4.3  CL 104  --  111 110 108 101 100  CO2 10*  --  21* 29 33* 30 21*  GLUCOSE 710*  --  321* 171* 113* 108* 413*  BUN 39*  --  32* 26* 18 12 15   CREATININE 3.81*  --  2.50* 1.78* 1.28* 1.00 0.91  CALCIUM  11.6*  --  10.9* 9.9 9.4 9.3 9.0  MG 3.1*  --   --   --  1.9  --   --   PHOS 4.9*  --   --   --   --   --   --    < > = values in this interval not displayed.   Liver Function Tests: Recent Labs  Lab 07/31/24 0154  AST 25  ALT 18  ALKPHOS 54  BILITOT 0.9  PROT 5.5*  ALBUMIN 3.0*   No results for input(s): LIPASE, AMYLASE in the last 168 hours. Recent Labs  Lab 07/29/24 2115  AMMONIA 48*   CBC: Recent Labs  Lab 07/29/24 1719 07/29/24 1724 07/29/24 1733 07/29/24 2118 07/30/24 0959  07/31/24 0154 08/01/24 0830 08/02/24 1120  WBC 34.1*  --  35.8*  --  23.3* 15.6* 7.9 6.3  NEUTROABS 31.7*  --  DUPLICATE REQUEST  SEE [id number]  --   --  13.0* 4.5 4.0  HGB 13.1   < > 13.0 13.3 12.4* 11.1* 12.5* 13.7  HCT 50.0   < > 48.7 39.0 36.0* 32.6* 38.1* 41.8  MCV 108.5*  --  108.2*  --  83.3 83.4 87.4 88.0  PLT 492*  --  468*  --  334 271 260 223   < > = values in this interval not displayed.   Cardiac Enzymes: Recent Labs  Lab 07/29/24 1719  CKTOTAL 145   BNP: Invalid input(s): POCBNP CBG: Recent Labs  Lab 08/02/24 1126 08/02/24 1623 08/02/24 2123 08/03/24 0616 08/03/24 1205  GLUCAP 390* 215* 336* 277* 301*   D-Dimer No results for input(s): DDIMER in the last 72 hours. Hgb A1c No results for input(s): HGBA1C in the last 72 hours. Lipid Profile No results for input(s): CHOL, HDL, LDLCALC, TRIG, CHOLHDL, LDLDIRECT in the last 72 hours. Thyroid  function studies No results for input(s): TSH, T4TOTAL, T3FREE, THYROIDAB in the last 72 hours.  Invalid input(s): FREET3 Anemia work up No results for input(s): VITAMINB12, FOLATE, FERRITIN, TIBC, IRON, RETICCTPCT in the last 72 hours. Urinalysis    Component Value Date/Time   COLORURINE YELLOW 07/29/2024 1719   APPEARANCEUR HAZY (A) 07/29/2024 1719   LABSPEC 1.020 07/29/2024 1719   PHURINE 5.0 07/29/2024 1719   GLUCOSEU >=500 (A) 07/29/2024 1719   HGBUR SMALL (A) 07/29/2024 1719   BILIRUBINUR NEGATIVE 07/29/2024 1719   KETONESUR 20 (A) 07/29/2024 1719   PROTEINUR 100 (A) 07/29/2024 1719   NITRITE NEGATIVE 07/29/2024 1719   LEUKOCYTESUR NEGATIVE 07/29/2024 1719   Sepsis Labs Recent Labs  Lab 07/30/24 0959 07/31/24 0154 08/01/24 0830 08/02/24 1120  WBC 23.3* 15.6* 7.9 6.3   Microbiology Recent Results (from the past 240 hours)  Blood Culture (routine x 2)     Status: None   Collection Time: 07/29/24  6:19 PM   Specimen: BLOOD LEFT FOREARM  Result Value Ref Range  Status   Specimen Description BLOOD LEFT FOREARM  Final   Special Requests   Final    BOTTLES DRAWN AEROBIC AND ANAEROBIC Blood Culture results may not be optimal due to an inadequate volume of blood received in culture bottles   Culture   Final    NO GROWTH 5 DAYS Performed at [facility], [address], [place], [place] [address]    Report Status 08/03/2024 FINAL  Final  Blood Culture (routine x 2)     Status: None   Collection Time: 07/29/24  6:24 PM   Specimen: BLOOD RIGHT ARM  Result Value Ref Range Status   Specimen Description BLOOD RIGHT ARM  Final   Special Requests   Final    BOTTLES DRAWN AEROBIC AND ANAEROBIC Blood Culture results may not be optimal due to an inadequate volume of blood received in culture bottles   Culture   Final    NO GROWTH 5 DAYS Performed at [facility], [address], [place], [place] [address]    Report Status 08/03/2024 FINAL  Final  MRSA Next Gen by PCR, Nasal     Status: None   Collection Time: 07/29/24  6:34 PM   Specimen: Nasal Mucosa; Nasal Swab  Result Value Ref Range Status   MRSA by PCR Next Gen NOT DETECTED NOT DETECTED Final    Comment: (NOTE) The  GeneXpert MRSA Assay (FDA approved for NASAL specimens only), is one component of a comprehensive MRSA colonization surveillance program. It is not intended to diagnose MRSA infection nor to guide or monitor treatment for MRSA infections. Test performance is not FDA approved in patients less than 17 years old. Performed at [facility], [address] [place], [place] [address]   Respiratory (~20 pathogens) panel by PCR     Status: Abnormal   Collection Time: 08/02/24  5:00 PM   Specimen: Nasopharyngeal Swab; Respiratory  Result Value Ref Range Status   Adenovirus NOT DETECTED NOT DETECTED Final   Coronavirus 229E NOT DETECTED NOT DETECTED Final    Comment: (NOTE) The Coronavirus on the Respiratory Panel, DOES NOT test for the novel  Coronavirus (2019 nCoV)     Coronavirus HKU1 NOT DETECTED NOT DETECTED Final   Coronavirus NL63 NOT DETECTED NOT DETECTED Final   Coronavirus OC43 NOT DETECTED NOT DETECTED Final   Metapneumovirus NOT DETECTED NOT DETECTED Final   Rhinovirus / Enterovirus DETECTED (A) NOT DETECTED Final   Influenza A NOT DETECTED NOT DETECTED Final   Influenza B NOT DETECTED NOT DETECTED Final   Parainfluenza Virus 1 NOT DETECTED NOT DETECTED Final   Parainfluenza Virus 2 NOT DETECTED NOT DETECTED Final   Parainfluenza Virus 3 NOT DETECTED NOT DETECTED Final   Parainfluenza Virus 4 NOT DETECTED NOT DETECTED Final   Respiratory Syncytial Virus NOT DETECTED NOT DETECTED Final   Bordetella pertussis NOT DETECTED NOT DETECTED Final   Bordetella Parapertussis NOT DETECTED NOT DETECTED Final   Chlamydophila pneumoniae NOT DETECTED NOT DETECTED Final   Mycoplasma pneumoniae NOT DETECTED NOT DETECTED Final    Comment: Performed at [facility], [address] [place], [place] [address]    FURTHER DISCHARGE INSTRUCTIONS:   Get Medicines reviewed and adjusted: Please take all your medications with you for your next visit with your Primary MD   Laboratory/radiological data: Please request your Primary MD to go over all hospital tests and procedure/radiological results at the follow up, please ask your Primary MD to get all Hospital records sent to his/her office.   In some cases, they will be blood work, cultures and biopsy results pending at the time of your discharge. Please request that your primary care M.D. goes through all the records of your hospital data and follows up on these results.   Also Note the following: If you experience worsening of your admission symptoms, develop shortness of breath, life threatening emergency, suicidal or homicidal thoughts you must seek medical attention immediately by calling 911 or calling your MD immediately  if symptoms less severe.   You must read complete instructions/literature along  with all the possible adverse reactions/side effects for all the Medicines you take and that have been prescribed to you. Take any new Medicines after you have completely understood and accpet all the possible adverse reactions/side effects.    patient was instructed, not to drive, operate heavy machinery, perform activities at heights, swimming or participation in water  activities or provide baby-sitting services while on Pain, Sleep and Anxiety Medications; until their outpatient Physician has advised to do so again. Also recommended to not to take more than prescribed Pain, Sleep and Anxiety Medications.  It is not advisable to combine anxiety, sleep and pain medications without talking with your primary care provider.     Wear Seat belts while driving.   Please note: You were cared for by a hospitalist during your hospital stay. Once  you are discharged, your primary care physician will handle any further medical issues. Please note that NO REFILLS for any discharge medications will be authorized once you are discharged, as it is imperative that you return to your primary care physician (or establish a relationship with a primary care physician if you do not have one) for your post hospital discharge needs so that they can reassess your need for medications and monitor your lab values  Time coordinating discharge: Over 30 minutes  SIGNED:   Fredia Skeeter, MD  [facility] 08/03/2024, 12:29 PM *Please note that this is a verbal dictation therefore any spelling or grammatical errors are due to the Dragon Medical One system interpretation. If 7PM-7AM, please contact night-coverage [email or web address]

## 2024-08-03 NOTE — TOC Transition Note (Signed)
 Transition of Care [facility]) - Discharge Note   Patient Details  Name: Jacob [name] MRN: [id number] Date of Birth: May 19, 1991  Transition of Care [facility] CM/SW Contact:  Luann SHAUNNA Cumming, LCSW Phone Number: 08/03/2024, 12:53 PM   Clinical Narrative:      Per MD patient ready for DC to [facility]. RN, patient, and facility notified of DC. RN to call report prior to discharge [phone]). Voluntary Consent form has been signed and uploaded in chart. Pt can admit anytime after 3pm. Admitting to room 506-1 to the services of Dr. Prentis  CSW scheduled Safe Transport pickup for 3pm.   CSW will sign off for now as social work intervention is no longer needed. Please consult us  again if new needs arise.   Final next level of care: Psychiatric Hospital Barriers to Discharge: No Barriers Identified                   Discharge Plan and Services Additional resources added to the After Visit Summary for                                       Social Drivers of Health (SDOH) Interventions SDOH Screenings   Food Insecurity: Food Insecurity Present (07/31/2024)  Housing: High Risk (07/31/2024)  Transportation Needs: Unmet Transportation Needs (07/31/2024)  Utilities: At Risk (07/31/2024)     Readmission Risk Interventions     No data to display

## 2024-08-03 NOTE — Progress Notes (Signed)
 Admission Note: Patient is a 33 years old male admitted to the unit from [address] [place] medical floor for medication/treatment noncompliance, substance abuse, and homelessness.  UDS positive for Cocaine and THC.  Per reports patient was found unresponsive and with altered mental status due to DKA.  Patient tested positive for Rhinovirus.  Droplet and contact precaution initiated.  Patient educated on good hand hygiene and hydration.  Admission plan of care reviewed, consent signed.  Skin and personal belongings assessment completed.  Skin is dry and intact.  Items deemed contraband placed in the locker.  Patient oriented to the unit, staff and room.  Routine safety checks initiated.  Verbalizes understanding of unit rules/protocols.  Patient is safe on the unit.

## 2024-08-03 NOTE — TOC Progression Note (Signed)
 Transition of Care [facility]) - Progression Note    Patient Details  Name: Jacob [name] MRN: [id number] Date of Birth: 1991/06/21  [facility]) CM/SW Contact  Luann SHAUNNA Cumming, LCSW Phone Number: 08/03/2024, 8:41 AM  Clinical Narrative:     Pt tested positive for Rhinovirus. [facility] would require isolation room and they do not have a single occupancy room right now.   CSW faxed referrals to external facilities.   Expected Discharge Plan: Psychiatric Hospital                               Social Drivers of Health (SDOH) Interventions SDOH Screenings   Food Insecurity: Food Insecurity Present (07/31/2024)  Housing: High Risk (07/31/2024)  Transportation Needs: Unmet Transportation Needs (07/31/2024)  Utilities: At Risk (07/31/2024)    Readmission Risk Interventions     No data to display

## 2024-08-03 NOTE — Tx Team (Signed)
 Initial Treatment Plan 08/03/2024 4:54 PM Jacob [name] FMW:[id number]    PATIENT STRESSORS: Financial difficulties   Health problems   Medication change or noncompliance     PATIENT STRENGTHS: Ability for insight  Supportive family/friends    PATIENT IDENTIFIED PROBLEMS: To learn to coping skills for life  Not compliant with treatment regimen for his diabetic  Anxiety  Depression  Homelessness  Ineffective coping skills           DISCHARGE CRITERIA:  Ability to meet basic life and health needs Medical problems require only outpatient monitoring Safe-care adequate arrangements made  PRELIMINARY DISCHARGE PLAN: Attend aftercare/continuing care group Outpatient therapy Placement in alternative living arrangements  PATIENT/FAMILY INVOLVEMENT: This treatment plan has been presented to and reviewed with the patient, Jacob [name], and/or family member.  The patient and family have been given the opportunity to ask questions and make suggestions.  Almarie MALVA Lowers, RN 08/03/2024, 4:54 PM

## 2024-08-03 NOTE — Progress Notes (Signed)
(  Sleep Hours) -8.5  (Any PRNs that were needed, meds refused, or side effects to meds)- Trazodone  50 mg  (Any disturbances and when (visitation, over night)-none  (Concerns raised by the patient)- none  (SI/HI/AVH)-denies

## 2024-08-04 DIAGNOSIS — F15959 Other stimulant use, unspecified with stimulant-induced psychotic disorder, unspecified: Secondary | ICD-10-CM

## 2024-08-04 DIAGNOSIS — F250 Schizoaffective disorder, bipolar type: Principal | ICD-10-CM

## 2024-08-04 LAB — GLUCOSE, CAPILLARY
Glucose-Capillary: 335 mg/dL — ABNORMAL HIGH (ref 70–99)
Glucose-Capillary: 467 mg/dL — ABNORMAL HIGH (ref 70–99)
Glucose-Capillary: 133 mg/dL — ABNORMAL HIGH (ref 70–99)
Glucose-Capillary: 261 mg/dL — ABNORMAL HIGH (ref 70–99)

## 2024-08-04 MED ORDER — LORATADINE 10 MG PO TABS: 10.0000 mg | ORAL_TABLET | Freq: Every day | ORAL | Status: DC | PRN

## 2024-08-04 MED ORDER — INSULIN GLARGINE 100 UNIT/ML ~~LOC~~ SOLN
18.0000 [IU] | Freq: Two times a day (BID) | SUBCUTANEOUS | Status: DC
  Administered 2024-08-04 – 2024-08-10 (×23): 18 [IU] via SUBCUTANEOUS
  Filled 2024-08-04 (×14): qty 0.18

## 2024-08-04 MED ORDER — INSULIN ASPART 100 UNIT/ML IJ SOLN
6.0000 [IU] | Freq: Three times a day (TID) | INTRAMUSCULAR | Status: DC
  Administered 2024-08-04 – 2024-08-10 (×31): 6 [IU] via SUBCUTANEOUS

## 2024-08-04 NOTE — H&P (Signed)
 Psychiatric Admission Assessment Adult  Patient Identification: Jacob [name] MRN:  [id number] Date of Evaluation:  08/04/2024  Principal Diagnosis: Schizoaffective disorder, bipolar type (HCC) Diagnosis:  Principal Problem:   Schizoaffective disorder, bipolar type (HCC) Active Problems:   Oth stimulant use, unsp w stim-induce psych disorder, unsp (HCC)  Total Time spent with patient: 45 minutes  History of Present Illness:  The patient is a 33 y.o. male (homeless, unemployed) with a medical history of T1DM and a psychiatric history of schizoaffective disorder bipolar-type admitted voluntarily to [facility] following medical admission for DKA.   Psychiatric history is notable for numerous admissions over the last several years for psychotic symptoms including delusional content, hallucinations and agitated behavior [facility], old [place], possibly [employer], [employer] and [place] ). Most recently admitted to [facility] February 2025. Psychotic symptoms have been confounded by substance use and poorly controled diabetes. He has had numerous admissions for DKA, some with noted encephalopathy. Previous diagnoses include schizoaffective disorder, Schizophrenia, bipolar disorder, stimulant use disorder (cocaine). It has been noted that mental illness appears to prevent management of diabetes to some degree and he has been following with an ACTT team (see number below). He has one prior suicide attempt in 2019. No self-harming behaviors. No history of trauma or anxiety symptoms. Usage of stimulants (cocaine) noted at various times with unclear pattern.   Patient's outpatient diagnosis most recently has been SAD-BT. He has [facility] ACTT Team at [phone].  Home regimen: Haldol  Dec 100 mg q28days, last given 07/11/2024; was next due 08/08/24    On this presentation The patient was admitted to ICU on 9/28 after being found down unresponsive, tachycardic and hypothermic with  severely elevated glucose levels - admitted for what was ultimately determined to be DKA and with superimposed encephalopathy and AKI. Initially on IV abx to r/o sepsis contributing, however infectious workup overall negative. DKA resolved with 15 U nightly of lantus  and resistance SSI with doses of scheduled lantus  18 UBID  and novolog  6 TID w/meals scheduled prior to discharge. Additional notable labs at admission included UDS positive for cocaine. Patient alert and oriented by 9/30, at which time psychiatry consultation was requested due to agitation (requiring restraints) discovered underlying schizoaffective disorder, BT and request for medication optimization.   On 10/1 psychiatry noted ongoing breakthrough psychotic symptoms including disorganized thoughts, delusional content. Although up-to-date on LAI (last given 07/11/24 and next due 08/08/24). Family collateral indicated recent disorganized and bizarre behavior as well as homelessness. Symptoms were concerning for break-through psychosis vs worsening of psychotic symptoms due to cocaine use. He was prescribed supplemental PO haldol  10 mg at bedtime (initiated 10/1) and with plan for increased dose of LAI at next injection. Patient boarded in the hospital until Ultimately medically cleared and transferred evening of 08/03/24.   Today: The patient reports that he is fine. Denies current mood symptoms or anxiety. Denies AVH stating that he has never had this concern. States he takes haldol  for his behavioral problems and does not feel he has any additional concerns. Reports good sleep and appetite and states his main concern is figuring out where he is going after the hospital. He is open to getting the increased dosage of his LAI when it is next due as we discussed. No additional concerns and no additional information volunteered. No SI, HI or AVH.    [place] Scale:  Flowsheet Row [facility]) [facility]  INPAT[facility] 500B ED to Hosp-Admission (Discharged) from 07/29/2024  in [place] 3W Progressive [facility]  C-SSRS RISK CATEGORY No Risk No Risk    Alcohol Screening: Patient refused Alcohol Screening Tool: Yes 1. How often do you have a drink containing alcohol?: Never 2. How many drinks containing alcohol do you have on a typical day when you are drinking?: 1 or 2 3. How often do you have six or more drinks on one occasion?: Never AUDIT-C Score: 0 4. How often during the last year have you found that you were not able to stop drinking once you had started?: Never 5. How often during the last year have you failed to do what was normally expected from you because of drinking?: Never 6. How often during the last year have you needed a first drink in the morning to get yourself going after a heavy drinking session?: Never 7. How often during the last year have you had a feeling of guilt of remorse after drinking?: Never 8. How often during the last year have you been unable to remember what happened the night before because you had been drinking?: Never 9. Have you or someone else been injured as a result of your drinking?: No 10. Has a relative or friend or a doctor or another health worker been concerned about your drinking or suggested you cut down?: No Alcohol Use Disorder Identification Test Final Score (AUDIT): 0 Alcohol Brief Interventions/Follow-up: Patient Refused Substance Abuse History in the last 12 months:  Yes.   Consequences of Substance Abuse:  Past Medical History:  Past Medical History:  Diagnosis Date   Diabetes mellitus without complication (HCC)    History reviewed. No pertinent surgical history. Family History: History reviewed. No pertinent family history.  Tobacco Screening:  Social History   Tobacco Use  Smoking Status Unknown  Smokeless Tobacco Not on file    BH Tobacco Counseling     Are you interested in Tobacco Cessation Medications?  Yes, implement  Nicotene Replacement Protocol Counseled patient on smoking cessation:  Yes Reason Tobacco Screening Not Completed: No value filed.       Social History:  Social History   Substance and Sexual Activity  Alcohol Use Not Currently     Social History   Substance and Sexual Activity  Drug Use Not Currently    Additional Social History: See HPI - patient currently homeless, does have an ACT team that is working on disability, housing and income.   Allergies:  No Known Allergies Lab Results:  Results for orders placed or performed during the hospital encounter of 08/03/24 (from the past 48 hours)  Glucose, capillary     Status: Abnormal   Collection Time: 08/03/24  5:30 PM  Result Value Ref Range   Glucose-Capillary 189 (H) 70 - 99 mg/dL    Comment: Glucose reference range applies only to samples taken after fasting for at least 8 hours.  Glucose, capillary     Status: Abnormal   Collection Time: 08/03/24  7:36 PM  Result Value Ref Range   Glucose-Capillary 252 (H) 70 - 99 mg/dL    Comment: Glucose reference range applies only to samples taken after fasting for at least 8 hours.  Glucose, capillary     Status: Abnormal   Collection Time: 08/04/24  6:13 AM  Result Value Ref Range   Glucose-Capillary 335 (H) 70 - 99 mg/dL    Comment: Glucose reference range applies only to samples taken after fasting for at least 8 hours.  Glucose, capillary     Status: Abnormal  Collection Time: 08/04/24 12:00 PM  Result Value Ref Range   Glucose-Capillary 467 (H) 70 - 99 mg/dL    Comment: Glucose reference range applies only to samples taken after fasting for at least 8 hours.    Blood Alcohol level:  Lab Results  Component Value Date   [facility] <15 07/29/2024    Metabolic Disorder Labs:  No results found for: HGBA1C, MPG No results found for: PROLACTIN No results found for: CHOL, TRIG, HDL, CHOLHDL, VLDL, LDLCALC  Current Medications: Current Facility-Administered  Medications  Medication Dose Route Frequency Provider Last Rate Last Admin   acetaminophen  (TYLENOL ) tablet 650 mg  650 mg Oral Q6H PRN Starkes-Perry, Takia S, FNP       alum & mag hydroxide-simeth (MAALOX/MYLANTA) 200-200-20 MG/5ML suspension 30 mL  30 mL Oral Q4H PRN Starkes-Perry, Takia S, FNP       dextromethorphan-guaiFENesin (MUCINEX DM) 30-600 MG per 12 hr tablet 1 tablet  1 tablet Oral BID PRN Starkes-Perry, Majel RAMAN, FNP       haloperidol  (HALDOL ) tablet 5 mg  5 mg Oral TID PRN Wilkie Majel RAMAN, FNP       And   diphenhydrAMINE  (BENADRYL ) capsule 50 mg  50 mg Oral TID PRN Starkes-Perry, Majel RAMAN, FNP       haloperidol  lactate (HALDOL ) injection 5 mg  5 mg Intramuscular TID PRN Starkes-Perry, Majel RAMAN, FNP       And   diphenhydrAMINE  (BENADRYL ) injection 50 mg  50 mg Intramuscular TID PRN Starkes-Perry, Majel RAMAN, FNP       And   LORazepam  (ATIVAN ) injection 2 mg  2 mg Intramuscular TID PRN Starkes-Perry, Majel RAMAN, FNP       haloperidol  lactate (HALDOL ) injection 10 mg  10 mg Intramuscular TID PRN Starkes-Perry, Majel RAMAN, FNP       And   diphenhydrAMINE  (BENADRYL ) injection 50 mg  50 mg Intramuscular TID PRN Starkes-Perry, Majel RAMAN, FNP       And   LORazepam  (ATIVAN ) injection 2 mg  2 mg Intramuscular TID PRN Starkes-Perry, Majel RAMAN, FNP       docusate sodium  (COLACE) capsule 100 mg  100 mg Oral BID PRN Wilkie Majel RAMAN, FNP       haloperidol  (HALDOL ) tablet 10 mg  10 mg Oral QHS Wilkie Majel RAMAN, FNP   10 mg at 08/03/24 2026   influenza vac split trivalent PF (FLUZONE) injection 0.5 mL  0.5 mL Intramuscular Tomorrow-1000 Bouchard, Marc A, DO       insulin  aspart (novoLOG ) injection 0-20 Units  0-20 Units Subcutaneous TID WC Starkes-Perry, Takia S, FNP   20 Units at 08/04/24 1205   insulin  aspart (novoLOG ) injection 0-5 Units  0-5 Units Subcutaneous QHS Wilkie Majel RAMAN, FNP   3 Units at 08/03/24 2027   insulin  aspart (novoLOG ) injection 6 Units  6 Units Subcutaneous TID  WC Towana Leita SAILOR, MD   6 Units at 08/04/24 1207   insulin  glargine (LANTUS ) injection 18 Units  18 Units Subcutaneous BID Towana Leita SAILOR, MD       loratadine (CLARITIN) tablet 10 mg  10 mg Oral Daily PRN Towana Leita SAILOR, MD       nicotine  (NICODERM CQ  - dosed in mg/24 hours) patch 21 mg  21 mg Transdermal Daily Wilkie Majel RAMAN, FNP   21 mg at 08/04/24 9161   sodium chloride  (OCEAN) 0.65 % nasal spray 1 spray  1 spray Each Nare PRN Wilkie Majel RAMAN, FNP  traZODone  (DESYREL ) tablet 50 mg  50 mg Oral QHS Starkes-Perry, Takia S, FNP   50 mg at 08/03/24 2026   PTA Medications: Medications Prior to Admission  Medication Sig Dispense Refill Last Dose/Taking   insulin  aspart (NOVOLOG ) 100 UNIT/ML FlexPen Inject 6 Units into the skin 3 (three) times daily with meals. 9 mL 0    insulin  glargine (LANTUS ) 100 UNIT/ML Solostar Pen Inject 18 Units into the skin 2 (two) times daily. 9 mL 0     Mental Status exam: Appearance: black male of thin body habitus, appropriately groomed, seen reclining in bed, does rouse briefly for interview  Eye contact: fair - often closes eyes  Attitude towards examiner somewhat disengaged, provides limited information but does answer questions  Psychomotor: no agitation or retardation  Speech: reduced amount, quiet Language: no delays  Mood: fine  Affect: restricted, somnolent  Thought content: denying SI and HI, no delusions expressed  Thought Process: largely linear on limited interview  Perception: denying AVH, not RTIS  Insight: limited  Judgement: poor based on recent events   Orientation: to self and hospital, did not answer date  Attention/Concentration: limited - distractible and appears volitionally disengaged  Memory/Cognition: grossly intact based on conversation   Fund of Knowledge: Average    Musculoskeletal: Strength & Muscle Tone: within normal limits Gait & Station: normal Patient leans: N/A  Physical Exam: Physical  Exam Constitutional:      Appearance: Normal appearance.  HENT:     Head: Normocephalic and atraumatic.  Pulmonary:     Effort: Pulmonary effort is normal.  Musculoskeletal:        General: Normal range of motion.  Neurological:     General: No focal deficit present.     Mental Status: He is alert.    Review of Systems  Constitutional: Negative.   HENT: Negative.    Respiratory: Negative.    Cardiovascular: Negative.   Gastrointestinal: Negative.   Genitourinary: Negative.    Blood pressure 126/81, pulse (!) 109, temperature 98 F (36.7 C), temperature source Oral, resp. rate 16, height 5' 6 (1.676 m), weight 57.6 kg, SpO2 100%. Body mass index is 20.5 kg/m.  Treatment Plan Summary: Daily contact with patient to assess and evaluate symptoms and progress in treatment   Assessment: The patient is a 33 y.o. male with a psychiatric history currently most consistent with schizoaffective disorder, bipolar type and unspecified stimulant use disorder. He has recurrent inpatient admissions for psychotic symptoms (and some reported manic symptoms) over the past several years. Although this is confounded by cocaine use and recurrent hospitalizations for DKA (often with encephalopathy) psychotic symptoms have included hallucinations, longstanding delusions, disorganizations in thoughts and behavior and poor self care. Prior notes have indicated mental health concerns contribute significantly to poor management of diabetes. Pattern of current/recent cocaine usage is not clear, but UDS was positive for cocaine on this admission. As he had been up-to-date on his haldol  decanoate LAI, consult team at the hospital indicated breakthrough symptoms may be more related to substance use.  On this admission, he presents after transfer from hospital stay where he was admitted with DKA for several days. During hospitalization noted to be expressing delusional content, agitated and with concern for  breakthrough symptoms vs worsening of symptoms due to stimulant use. Supplemental haldol  10 mg was started on 10/1 with good response indicting a maintenance dose of 150-200 mg when next due (next due 08/08/24).  Today the patient appeared volitionally disengaged in interview. He did answer  some questions, denying all concerns and largely focused on when he will be leaving the hospital. Was not objectively psychotic or manic; not RTIS or presenting with significant disorganization. Current supplemental dose of haldol  appears to be well-covering symptoms. Will plan for likely dose of 200 mg IM of haldol  when next due (given approx. 20 mg PO dose with 100 mg IM LAI already in system + 10 oral) when it is next due. WE may likely give this a few days early, likely Monday as he appears to already be at or near baseline.   DSM-5 diagnoses: Schizoaffective disorder, bipolar type  Stimulant use disorder, unspecified    Plan:  Legal Status: -voluntary  Safety -q15 minute checks  -elopement, suicide and assault precautions  -daily vitals  Psychiatric Concerns  -Continue PO haldol  10 mg qhS   -plan to continue for 10-15 days after next injection  -LAI due 10/825  -plan for 200 mg IM dosage for maintenance and may give a few days early on 10/6 (Monday)  Substance use concerns  -Cocaine - unspecified pattern of usage at this time  Nicotine  Replacement  Patch and gum available   Medical concerns -T1DM with recent admission for DKA:  -Insulin  Aspart 6 mg TID with meals  -Insuline Glargine 18 U BID   -High-sensitivity sliding scale 0-20 U  -will continue to monitor closely  Additional PRNs: -Tylenol  tablets 650 mg every 6 hours as needed for pain -Maalox/Mylanta suspension 30 mL every 4 hours as needed for indigestion  -Milk of Magnesia 30 mL daily as needed for constipation  Labs -Reviewed as documented in HPI  Psychosocial interventions  -Motivational interviewing  -daily medication  management with psychiatry -Medication education regarding risks/benefits and alternatives -bedside psychotherapy as indicated  -Patient will be encouraged to participate and engage with group therapy  -Appreciate SW assistance in coordinating safe disposition   I certify that inpatient services furnished can reasonably be expected to improve the patient's condition.    Leita LOISE Arts, MD 10/4/20251:26 PM

## 2024-08-04 NOTE — Plan of Care (Signed)
   Problem: Education: Goal: Knowledge of [place] [facility] Education information/materials will improve Outcome: Progressing Goal: Emotional status will improve Outcome: Progressing Goal: Mental status will improve Outcome: Progressing Goal: Verbalization of understanding the information provided will improve Outcome: Progressing
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
## 2024-08-04 NOTE — Group Note (Signed)
 Date:  08/04/2024 Time:  9:00 PM  Group Topic/Focus:  Wrap-Up Group:   The focus of this group is to help patients review their daily goal of treatment and discuss progress on daily workbooks.    Participation Level:  Did Not Attend  Participation Quality:  Appropriate  Affect:  Appropriate  Cognitive:  Appropriate  Insight: Appropriate  Engagement in Group:  Engaged  Modes of Intervention:  Education and Exploration  Additional Comments:  Patient attended and participated in group tonight. He reports that his goal today was to concentrate and have compression by getting enough sleep and eating a lot.  Gwenn Chillington Dacosta 08/04/2024, 9:00 PM

## 2024-08-04 NOTE — [facility] Suicide Risk Assessment (Signed)
 [facility] Admission Suicide Risk Assessment   Total Time spent with patient: 45 minutes Principal Problem: Schizoaffective disorder, bipolar type (HCC) Diagnosis:  Principal Problem:   Schizoaffective disorder, bipolar type (HCC) Active Problems:   Oth stimulant use, unsp w stim-induce psych disorder, unsp (HCC)  Suicide risk assessment: The patient presented initially with acute risk factors of AMS/psychosis in the setting of DKA (admitted to the hospital for this). He carries additional chronic risk factors of recurrent depression and SI, one prior suicide attempt (in 2019), male gender, unstable housing, chronic illness. This is mitigated by no current reported depressed mood or SI, no observable signs of depression on exam. He is future oriented and adherent to outpatient medications. Has ACT team for support and support through mom. Current risk of suicide is deemed low  Patient is more at risk of unintentional death related to poor management of diabetes. Given frequent admissions for DKA, with underlying mental health suspected to related to poor adherence to insulin , ongoing hospitalization is warranted. Main goal of hospital stay is to administer LAI at a higher dose when next due this coming week.     I certify that inpatient services furnished can reasonably be expected to improve the patient's condition.   Jacob LOISE Arts, MD 08/04/2024, 1:43 PM

## 2024-08-04 NOTE — Group Note (Signed)
 [facility] LCSW Group Therapy Note   Group Date: 08/04/2024 Start Time: 1015 End Time: 1115   Type of Therapy/Topic:  Group Therapy:  Balance in Life.  Participation Level:  Active   Description of Group:    This group will address the concept of balance and how it feels and looks when one is unbalanced. Patients will be encouraged to process areas in their lives that are out of balance, and identify reasons for remaining unbalanced. Facilitators will guide patients utilizing problem- solving interventions to address and correct the stressor making their life unbalanced. Understanding and applying boundaries will be explored and addressed for obtaining  and maintaining a balanced life. Patients will be encouraged to explore ways to assertively make their unbalanced needs known to significant others in their lives, using other group members and facilitator for support and feedback.  Therapeutic Goals: Patient will identify two or more emotions or situations they have that consume much of in their lives. Patient will identify signs/triggers that life has become out of balance:  Patient will identify two ways to set boundaries in order to achieve balance in their lives:  Patient will demonstrate ability to communicate their needs through discussion and/or role plays  Summary of Patient Progress: Patient actively participated sharing balancing stressors, positive aspects of life and who they feel safe communicating their needs.   Therapeutic Modalities:   Cognitive Behavioral Therapy Solution-Focused Therapy Assertiveness Training   [place], LCSWA

## 2024-08-05 LAB — GLUCOSE, CAPILLARY
Glucose-Capillary: 192 mg/dL — ABNORMAL HIGH (ref 70–99)
Glucose-Capillary: 345 mg/dL — ABNORMAL HIGH (ref 70–99)
Glucose-Capillary: 328 mg/dL — ABNORMAL HIGH (ref 70–99)
Glucose-Capillary: 98 mg/dL (ref 70–99)

## 2024-08-05 MED ORDER — GLUCERNA SHAKE PO LIQD
237.0000 mL | Freq: Three times a day (TID) | ORAL | Status: DC
  Administered 2024-08-05 – 2024-08-08 (×14): 237 mL via ORAL
  Filled 2024-08-05 (×12): qty 237

## 2024-08-05 MED ORDER — HALOPERIDOL DECANOATE 100 MG/ML IM SOLN
200.0000 mg | INTRAMUSCULAR | Status: DC
Start: 2024-08-06 — End: 2024-08-10
  Administered 2024-08-06 (×2): 200 mg via INTRAMUSCULAR
  Filled 2024-08-05: qty 2

## 2024-08-05 NOTE — Progress Notes (Signed)
   08/05/24 1300  Psych Admission Type (Psych Patients Only)  Admission Status Voluntary  Psychosocial Assessment  Patient Complaints None  Eye Contact Fair  Facial Expression Animated  Affect Appropriate to circumstance  Speech Soft  Interaction Assertive  Motor Activity Pacing  Appearance/Hygiene In scrubs  Behavior Characteristics Cooperative  Mood Anxious  Thought Process  Coherency WDL  Content WDL  Delusions None reported or observed  Perception WDL  Hallucination None reported or observed  Judgment Impaired  Confusion None  Danger to Others  Danger to Others None reported or observed   DAR NOTE: Patient presents with anxious affect and depressed mood.  Denies suicidal thoughts, pain, auditory and visual hallucinations.  Rates depression at 0, hopelessness at 0, and anxiety at 0.  Maintained on routine safety checks.  Medications given as prescribed.  Support and encouragement offered as needed.  Attended group and participated.  States goal for today is concentration.  Patient observed interacting with peers in the dayroom.  Contact and droplet precaution maintained.  Offered no complaint.

## 2024-08-05 NOTE — Progress Notes (Signed)
 [facility] MD Progress Note  08/05/2024 2:41 PM Jacob [name]  MRN:  [id number] Subjective:    The patient is a 33 y.o. male (homeless, unemployed) with a medical history of T1DM and a psychiatric history of schizoaffective disorder bipolar-type admitted voluntarily to [facility] following medical admission for DKA.   Interval Events:  Sleep Hours) - 8 (Any PRNs that were needed, meds refused, or side effects to meds)-  (Any disturbances and when (visitation, over night)- None (Concerns raised by the patient)- Pr request Glucernia supplement added to profile.   (SI/HI/AVH)- Denies  Patient Interview:  The patient reports that he is good. Denies current mood symptoms or anxiety. Denies SI, HI, AVH. States mood is good and no behavior problems when he takes haldol . Reports good sleep and appetite and states his main concern is figuring out where he is going after the hospital. He is still open to getting the increased dosage of his LAI when it is next due as we discussed. No additional concerns and no additional information volunteered. He reports a cough and disclosed that a cough medicine is ordered, he just needs to ask.   Principal Problem: Schizoaffective disorder, bipolar type (HCC) Diagnosis: Principal Problem:   Schizoaffective disorder, bipolar type (HCC) Active Problems:   Oth stimulant use, unsp w stim-induce psych disorder, unsp (HCC)  Total Time Spent in Direct Patient Care:  I personally spent 30 minutes minutes on the unit in direct patient care. The direct patient care time included face-to-face time with the patient, reviewing the patient's chart, communicating with other professionals, and coordinating care. Greater than 50% of this time was spent in counseling or coordinating care with the patient regarding goals of hospitalization, psycho-education, and discharge planning needs.  Past Psychiatric History:  Psychiatric history is notable for numerous admissions over the last several  years for psychotic symptoms including delusional content, hallucinations and agitated behavior [facility], [facility], possibly [employer], [employer] and [employer] [place] ). Most recently admitted to [facility] February 2025. Psychotic symptoms have been confounded by substance use and poorly controled diabetes. He has had numerous admissions for DKA, some with noted encephalopathy. Previous diagnoses include schizoaffective disorder, Schizophrenia, bipolar disorder, stimulant use disorder (cocaine). It has been noted that mental illness appears to prevent management of diabetes to some degree and he has been following with an ACTT team (see number below). He has one prior suicide attempt in 2019. No self-harming behaviors. No history of trauma or anxiety symptoms. Usage of stimulants (cocaine) noted at various times with unclear pattern.    Patient's outpatient diagnosis most recently has been SAD-BT. He has [facility] ACTT Team at [phone].  Home regimen: Haldol  Dec 100 mg q28days, last given 07/11/2024; was next due 08/08/24    Past Medical History:  Past Medical History:  Diagnosis Date   Diabetes mellitus without complication (HCC)    History reviewed. No pertinent surgical history. Family History: History reviewed. No pertinent family history. Family Psychiatric  History: None disclosed  Social History:  Social History   Substance and Sexual Activity  Alcohol Use Not Currently     Social History   Substance and Sexual Activity  Drug Use Not Currently    Social History   Socioeconomic History   Marital status: Single    Spouse name: Not on file   Number of children: Not on file   Years of education: Not on file   Highest education level: Not on file  Occupational History   Not  on file  Tobacco Use   Smoking status: Unknown   Smokeless tobacco: Not on file  Substance and Sexual Activity   Alcohol use: Not Currently   Drug use: Not Currently   Sexual  activity: Not Currently  Other Topics Concern   Not on file  Social History Narrative   Not on file   Social Drivers of Health   Financial Resource Strain: Not on file  Food Insecurity: Food Insecurity Present (08/03/2024)   Hunger Vital Sign    Worried About Running Out of Food in the Last Year: Sometimes true    Ran Out of Food in the Last Year: Sometimes true  Transportation Needs: Unmet Transportation Needs (08/03/2024)   PRAPARE - Administrator, Civil Service (Medical): Yes    Lack of Transportation (Non-Medical): Patient declined  Physical Activity: Not on file  Stress: Not on file  Social Connections: Patient Declined (08/03/2024)   Social Connection and Isolation Panel    Frequency of Communication with Friends and Family: Patient declined    Frequency of Social Gatherings with Friends and Family: Patient declined    Attends Religious Services: Patient declined    Database administrator or Organizations: Patient declined    Attends Banker Meetings: Patient declined    Marital Status: Patient declined   Additional Social History:   Regarding his educational background, the patient reports that he completed twelfth grade and 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 He reports that he is currently unemployed but has been approved for disability benefits for mental health and receives approximately $280 per month in EBT assistance. He identifies no one as a support system, states his religion is Christian-affiliated, and reports basketball as a hobby. He identifies as straight and male, denies any military background. Patient current has an Investment banker, operational.                       Sleep: Good Estimated Sleeping Duration (Last 24 Hours): 8.25-10.25 hours  Appetite:  Good  Current Medications: Current Facility-Administered Medications  Medication Dose Route Frequency Provider Last Rate Last Admin   acetaminophen   (TYLENOL ) tablet 650 mg  650 mg Oral Q6H PRN Wilkie Majel RAMAN, FNP   650 mg at 08/04/24 2027   alum & mag hydroxide-simeth (MAALOX/MYLANTA) 200-200-20 MG/5ML suspension 30 mL  30 mL Oral Q4H PRN Starkes-Perry, Takia S, FNP       dextromethorphan-guaiFENesin (MUCINEX DM) 30-600 MG per 12 hr tablet 1 tablet  1 tablet Oral BID PRN Starkes-Perry, Takia S, FNP       haloperidol  (HALDOL ) tablet 5 mg  5 mg Oral TID PRN Starkes-Perry, Majel RAMAN, FNP       And   diphenhydrAMINE  (BENADRYL ) capsule 50 mg  50 mg Oral TID PRN Starkes-Perry, Majel RAMAN, FNP       haloperidol  lactate (HALDOL ) injection 5 mg  5 mg Intramuscular TID PRN Starkes-Perry, Majel RAMAN, FNP       And   diphenhydrAMINE  (BENADRYL ) injection 50 mg  50 mg Intramuscular TID PRN Starkes-Perry, Majel RAMAN, FNP       And   LORazepam  (ATIVAN ) injection 2 mg  2 mg Intramuscular TID PRN Starkes-Perry, Takia S, FNP       haloperidol  lactate (HALDOL ) injection 10 mg  10 mg Intramuscular TID PRN Wilkie Majel RAMAN, FNP       And   diphenhydrAMINE  (BENADRYL ) injection 50 mg  50 mg Intramuscular  TID PRN Wilkie Majel RAMAN, FNP       And   LORazepam  (ATIVAN ) injection 2 mg  2 mg Intramuscular TID PRN Starkes-Perry, Takia S, FNP       docusate sodium  (COLACE) capsule 100 mg  100 mg Oral BID PRN Wilkie Majel RAMAN, FNP       haloperidol  (HALDOL ) tablet 10 mg  10 mg Oral QHS Starkes-Perry, Takia S, FNP   10 mg at 08/04/24 2026   [START ON 08/06/2024] haloperidol  decanoate (HALDOL  DECANOATE) 100 MG/ML injection 200 mg  200 mg Intramuscular Q28 days Lenard Calin, MD       insulin  aspart (novoLOG ) injection 0-20 Units  0-20 Units Subcutaneous TID WC Starkes-Perry, Takia S, FNP   15 Units at 08/05/24 1200   insulin  aspart (novoLOG ) injection 0-5 Units  0-5 Units Subcutaneous QHS Starkes-Perry, Takia S, FNP   3 Units at 08/04/24 2029   insulin  aspart (novoLOG ) injection 6 Units  6 Units Subcutaneous TID WC Butler, Laura N, MD   6 Units at 08/05/24  1202   insulin  glargine (LANTUS ) injection 18 Units  18 Units Subcutaneous BID Towana Leita SAILOR, MD   18 Units at 08/05/24 9147   loratadine (CLARITIN) tablet 10 mg  10 mg Oral Daily PRN Towana Leita SAILOR, MD       nicotine  (NICODERM CQ  - dosed in mg/24 hours) patch 21 mg  21 mg Transdermal Daily Wilkie Majel RAMAN, FNP   21 mg at 08/05/24 9040   sodium chloride  (OCEAN) 0.65 % nasal spray 1 spray  1 spray Each Nare PRN Wilkie Majel RAMAN, FNP       traZODone  (DESYREL ) tablet 50 mg  50 mg Oral QHS Starkes-Perry, Takia S, FNP   50 mg at 08/04/24 2026    Lab Results:  Results for orders placed or performed during the hospital encounter of 08/03/24 (from the past 48 hours)  Glucose, capillary     Status: Abnormal   Collection Time: 08/03/24  5:30 PM  Result Value Ref Range   Glucose-Capillary 189 (H) 70 - 99 mg/dL    Comment: Glucose reference range applies only to samples taken after fasting for at least 8 hours.  Glucose, capillary     Status: Abnormal   Collection Time: 08/03/24  7:36 PM  Result Value Ref Range   Glucose-Capillary 252 (H) 70 - 99 mg/dL    Comment: Glucose reference range applies only to samples taken after fasting for at least 8 hours.  Glucose, capillary     Status: Abnormal   Collection Time: 08/04/24  6:13 AM  Result Value Ref Range   Glucose-Capillary 335 (H) 70 - 99 mg/dL    Comment: Glucose reference range applies only to samples taken after fasting for at least 8 hours.  Glucose, capillary     Status: Abnormal   Collection Time: 08/04/24 12:00 PM  Result Value Ref Range   Glucose-Capillary 467 (H) 70 - 99 mg/dL    Comment: Glucose reference range applies only to samples taken after fasting for at least 8 hours.  Glucose, capillary     Status: Abnormal   Collection Time: 08/04/24  5:00 PM  Result Value Ref Range   Glucose-Capillary 133 (H) 70 - 99 mg/dL    Comment: Glucose reference range applies only to samples taken after fasting for at least 8 hours.   Glucose, capillary     Status: Abnormal   Collection Time: 08/04/24  7:33 PM  Result Value Ref Range  Glucose-Capillary 261 (H) 70 - 99 mg/dL    Comment: Glucose reference range applies only to samples taken after fasting for at least 8 hours.  Glucose, capillary     Status: Abnormal   Collection Time: 08/05/24  6:26 AM  Result Value Ref Range   Glucose-Capillary 192 (H) 70 - 99 mg/dL    Comment: Glucose reference range applies only to samples taken after fasting for at least 8 hours.  Glucose, capillary     Status: Abnormal   Collection Time: 08/05/24 11:58 AM  Result Value Ref Range   Glucose-Capillary 345 (H) 70 - 99 mg/dL    Comment: Glucose reference range applies only to samples taken after fasting for at least 8 hours.    Blood Alcohol level:  Lab Results  Component Value Date   [facility] <15 07/29/2024    Metabolic Disorder Labs: No results found for: HGBA1C, MPG No results found for: PROLACTIN No results found for: CHOL, TRIG, HDL, CHOLHDL, VLDL, LDLCALC  Physical Findings: AIMS:  ,  ,  ,  ,  ,  ,   CIWA:    COWS:     Musculoskeletal: Strength & Muscle Tone: within normal limits Gait & Station: normal Patient leans: N/A  Psychiatric Specialty Exam:  Presentation  General Appearance:  Appropriate for Environment  Eye Contact: Good  Speech: Clear and Coherent  Speech Volume: Normal  Handedness:Right   Mood and Affect  Mood: Euthymic  Affect: Appropriate; Congruent   Thought Process  Thought Processes: Coherent  Descriptions of Associations:Intact  Orientation:Full (Time, Place and Person)  Thought Content:Logical  History of Schizophrenia/Schizoaffective disorder:No  Duration of Psychotic Symptoms:Greater than six months  Hallucinations:Hallucinations: None  Ideas of Reference:None  Suicidal Thoughts:Suicidal Thoughts: No  Homicidal Thoughts:Homicidal Thoughts: No   Sensorium  Memory: Immediate Fair; Recent  Good  Judgment: Fair  Insight: Fair   Art therapist  Concentration: Fair  Attention Span: Fair  Recall: [employer] of Knowledge: Fair  Language: Fair   Psychomotor Activity  Psychomotor Activity:Psychomotor Activity: Normal   Assets  Assets: Manufacturing systems engineer; Resilience; Social Support   Sleep  Sleep:Sleep: Good    Physical Exam: Physical Exam Constitutional:      General: He is not in acute distress.    Appearance: He is normal weight. He is not ill-appearing.  Neurological:     Mental Status: He is alert.    Review of Systems  Constitutional:  Negative for chills and fever.  Respiratory:  Positive for cough.   Gastrointestinal:  Negative for nausea and vomiting.  Psychiatric/Behavioral:  Negative for depression, hallucinations and suicidal ideas. The patient is not nervous/anxious and does not have insomnia.    Blood pressure 125/84, pulse 86, temperature 98 F (36.7 C), temperature source Oral, resp. rate 16, height 5' 6 (1.676 m), weight 57.6 kg, SpO2 100%. Body mass index is 20.5 kg/m.   Treatment Plan Summary:  Daily contact with patient to assess and evaluate symptoms and progress in treatment     Assessment: The patient is a 33 y.o. male with a psychiatric history currently most consistent with schizoaffective disorder, bipolar type and unspecified stimulant use disorder. He has recurrent inpatient admissions for psychotic symptoms (and some reported manic symptoms) over the past several years. Although this is confounded by cocaine use and recurrent hospitalizations for DKA (often with encephalopathy) psychotic symptoms have included hallucinations, longstanding delusions, disorganizations in thoughts and behavior and poor self care. Prior notes have indicated mental health concerns contribute significantly to  poor management of diabetes. Pattern of current/recent cocaine usage is not clear, but UDS was positive for cocaine on this  admission. As he had been up-to-date on his haldol  decanoate LAI, consult team at the hospital indicated breakthrough symptoms may be more related to substance use.   On this admission, he presents after transfer from hospital stay where he was admitted with DKA for several days. During hospitalization noted to be expressing delusional content, agitated and with concern for breakthrough symptoms vs worsening of symptoms due to stimulant use. Supplemental haldol  10 mg was started on 10/1 with good response indicting a maintenance dose of 150-200 mg when next due (next due 08/08/24).   Today the patient denies any complaints with interviewer. He denies any unstable mood or psychotic symptoms currently. Reports Haldol  is doing a good job, controlling behaviors. Was not objectively psychotic or manic; not RTIS or presenting with significant disorganization. Current supplemental dose of haldol  appears to be well-covering symptoms. Will plan for likely dose of 200 mg IM of haldol  when next due (given approx. 20 mg PO dose with 100 mg IM LAI already in system + 10 oral) when it is next due. WE may likely give this a few days early, likely Monday as he appears to already be at or near baseline.    DSM-5 diagnoses: Schizoaffective disorder, bipolar type  Stimulant use disorder, unspecified      Plan:   Legal Status: -voluntary   Safety -q15 minute checks  -elopement, suicide and assault precautions  -daily vitals   Psychiatric Concerns  -Continue PO haldol  10 mg qhS             -plan to continue for 10-15 days after next injection  -LAI due 10/825            -plan for 200 mg IM dosage for maintenance and may give a few days early on 10/6 (Monday)   Substance use concerns  -Cocaine - unspecified pattern of usage at this time   Nicotine  Replacement  Patch and gum available    Medical concerns -T1DM with recent admission for DKA:            -Insulin  Aspart 6 mg TID with meals             -Insuline Glargine 18 U BID             -High-sensitivity sliding scale 0-20 U            -will continue to monitor closely   Additional PRNs: -Tylenol  tablets 650 mg every 6 hours as needed for pain -Maalox/Mylanta suspension 30 mL every 4 hours as needed for indigestion  -Milk of Magnesia 30 mL daily as needed for constipation   Labs -Reviewed as documented in HPI   Psychosocial interventions  -Motivational interviewing  -daily medication management with psychiatry -Medication education regarding risks/benefits and alternatives -bedside psychotherapy as indicated  -Patient will be encouraged to participate and engage with group therapy  -Appreciate SW assistance in coordinating safe disposition    PATTI OLDEN, MD 08/05/2024, 2:41 PM

## 2024-08-05 NOTE — Plan of Care (Signed)
  Problem: Activity: Goal: Interest or engagement in activities will improve Outcome: Progressing   Problem: Health Behavior/Discharge Planning: Goal: Compliance with treatment plan for underlying cause of condition will improve Outcome: Progressing   

## 2024-08-05 NOTE — BH Assessment (Signed)
(  Sleep Hours) - 8 (Any PRNs that were needed, meds refused, or side effects to meds)-  (Any disturbances and when (visitation, over night)- None (Concerns raised by the patient)- Pr request Glucernia supplement added to profile.   (SI/HI/AVH)- Denies

## 2024-08-05 NOTE — Plan of Care (Signed)
   Problem: Education: Goal: Knowledge of [place] [facility] Education information/materials will improve Outcome: Progressing Goal: Emotional status will improve Outcome: Progressing Goal: Mental status will improve Outcome: Progressing Goal: Verbalization of understanding the information provided will improve Outcome: Progressing

## 2024-08-05 NOTE — Group Note (Signed)
 Date:  08/05/2024 Time:  9:06 PM  Group Topic/Focus:  Wrap-Up Group:   The focus of this group is to help patients review their daily goal of treatment and discuss progress on daily workbooks.    Participation Level:  Active  Participation Quality:  Appropriate and Attentive  Affect:  Appropriate  Cognitive:  Alert and Appropriate  Insight: Appropriate and Good  Engagement in Group:  Engaged  Modes of Intervention:  Discussion and Education  Additional Comments:  Pt attended and participated in wrap up roup this evening and rated their day a 10/10. Pt stated that their diet has been a lot better which has a positive contribution to their day. Pt has long-term goals to be more compassionate to others and to have a better relationship with their mother. Pt shared that they are expecting a visit from their mother tomorrow, which has caused the pt some anxiety. Pt has no concerns to relay at this time.   Jacob [name] 08/05/2024, 9:06 PM

## 2024-08-06 ENCOUNTER — Encounter (HOSPITAL_COMMUNITY): Payer: Self-pay

## 2024-08-06 LAB — GLUCOSE, CAPILLARY
Glucose-Capillary: 68 mg/dL — ABNORMAL LOW (ref 70–99)
Glucose-Capillary: 464 mg/dL — ABNORMAL HIGH (ref 70–99)
Glucose-Capillary: 262 mg/dL — ABNORMAL HIGH (ref 70–99)
Glucose-Capillary: 146 mg/dL — ABNORMAL HIGH (ref 70–99)

## 2024-08-06 MED ORDER — HALOPERIDOL 5 MG PO TABS
5.0000 mg | ORAL_TABLET | Freq: Every day | ORAL | Status: DC
  Administered 2024-08-06 – 2024-08-09 (×10): 5 mg via ORAL
  Filled 2024-08-06 (×4): qty 1

## 2024-08-06 NOTE — Telephone Encounter (Signed)
 [facility]. Pt has 2 charts(I sent a message to [place])

## 2024-08-06 NOTE — Group Note (Signed)
 LCSW Group Therapy Note   Group Date: 08/06/2024 Start Time: 1300 End Time: 1400   Participation:  patient attended half of the group session.  He actively participated in the discussion.  Type of Therapy:  Group Therapy  Objective:  Learn techniques for managing stress through body relaxation, mindfulness, and self-compassion.  Goals: Use body relaxation techniques, such as Box Breathing and Progressive Muscle Relaxation, to reduce physical tension. Practice mindfulness to break the cycle of overthinking and mental chatter. Embrace self-compassion to handle stress with kindness and resilience.  Summary:  Today's session focused on calming the body with relaxation techniques, breaking the cycle of stress with mindfulness, and using self-compassion to manage challenges more gracefully. These tools help reduce stress and foster a balanced, peaceful mindset.  Therapeutic Modalities used:  Elements of CBT ( cognitive restructuring)  Elements of DBT (box breathing, progressive body relaxation, mindfulness, acceptance)    Senna Lape O Griselle Rufer, LCSWA 08/06/2024  6:39 PM

## 2024-08-06 NOTE — Inpatient Diabetes Management (Signed)
 Inpatient Diabetes Program Recommendations  AACE/ADA: New Consensus Statement on Inpatient Glycemic Control (2015)  Target Ranges:  Prepandial:   less than 140 mg/dL      Peak postprandial:   less than 180 mg/dL (1-2 hours)      Critically ill patients:  140 - 180 mg/dL   Lab Results  Component Value Date   GLUCAP 68 (L) 08/06/2024    Review of Glycemic Control  Latest Reference Range & Units 08/05/24 06:26 08/05/24 11:58 08/05/24 17:06 08/05/24 19:22 08/06/24 06:06  Glucose-Capillary 70 - 99 mg/dL 807 (H) 654 (H) 671 (H) 98 68 (L)  (H): Data is abnormally high (L): Data is abnormally low  Diabetes history: DM1(does not make insulin .  Needs correction, basal and meal coverage)  Outpatient Diabetes medications: Lantus  18 units BID, Novolog  6 units TID  Current orders for Inpatient glycemic control: Lantus  18 units BID, Novolog  6 units TID, Novolog  0-20 units TID  Inpatient Diabetes Program Recommendations:    Mild hypoglycemia this morning.  Please consider:  Novolog  0-9 units TID and 0-5 units at bedtime  Thank you, Wyvonna Pinal, MSN, CDCES Diabetes Coordinator Inpatient Diabetes Program [phone] (team pager from 8a-5p)

## 2024-08-06 NOTE — Plan of Care (Signed)
   Problem: Education: Goal: Knowledge of [place] [facility] Education information/materials will improve Outcome: Progressing Goal: Emotional status will improve Outcome: Progressing Goal: Mental status will improve Outcome: Progressing Goal: Verbalization of understanding the information provided will improve Outcome: Progressing

## 2024-08-06 NOTE — BHH Counselor (Signed)
 CSW spoke with patient regarding discharge plan. Patient reported not having anywhere to go upon discharge. CSW reviewed shelter list with patient who reported he had been to many but wasn't sure he wanted to go to another one. Patient reported he'd call his mother to see if he'd potentially be able to stay with her temporarily. CSW to check in with patient in the morning regarding discharge plan.   Pearson Reasons, LCSWA 08/06/24 3:31PM

## 2024-08-06 NOTE — Progress Notes (Addendum)
 [facility] MD Progress Note  08/06/2024 8:21 AM Diem Biehn  MRN:  [id number]   Principal Problem: Schizoaffective disorder, bipolar type (HCC) Diagnosis: Principal Problem:   Schizoaffective disorder, bipolar type (HCC) Active Problems:   Oth stimulant use, unsp w stim-induce psych disorder, unsp (HCC)  Total Time Spent in Direct Patient Care:  I personally spent 30 minutes minutes on the unit in direct patient care. The direct patient care time included face-to-face time with the patient, reviewing the patient's chart, communicating with other professionals, and coordinating care. Greater than 50% of this time was spent in counseling or coordinating care with the patient regarding goals of hospitalization, psycho-education, and discharge planning needs.  Identifying Information and Past Psychiatric History:  The patient is a 33 y.o. male (homeless, unemployed) with a medical history of T1DM and a psychiatric history of schizoaffective disorder bipolar-type admitted voluntarily to [facility] following medical admission for DKA.   Psychiatric history is notable for numerous admissions over the last several years for psychotic symptoms including delusional content, hallucinations and agitated behavior [facility], old [place], possibly [employer], [employer] and [place] ). Most recently admitted to [facility] February 2025. Psychotic symptoms have been confounded by substance use and poorly controled diabetes. He has had numerous admissions for DKA, some with noted encephalopathy. Previous diagnoses include schizoaffective disorder, Schizophrenia, bipolar disorder, stimulant use disorder (cocaine). It has been noted that mental illness appears to prevent management of diabetes to some degree and he has been following with an ACTT team (see number below). He has one prior suicide attempt in 2019. No self-harming behaviors. No history of trauma or anxiety symptoms. Usage of stimulants  (cocaine) noted at various times with unclear pattern.    Patient's outpatient diagnosis most recently has been SAD-BT. He has [facility] ACTT Team at [phone].  Home regimen: Haldol  Dec 100 mg q28days, last given 07/11/2024; was next due 08/08/24   Interval events: Patient documented to be intermittently attending groups with good engagement. Appears appropriate and denying all concerns. Slept 10.25 hrs overnight. No PRNs for agitation required, medication compliant. He did receive his LAI this morning. Blood sugar noted to be somewhat low this morning (68 @ 6 am), however then scored 464 just before noon.   Interview today: Today the patient reports he is pretty good. He continues to deny any SI, HI or AVH, stating he has not had hallucinations since he got here. His mom is coming to visit tonight and he is looking forward to that. Otherwise he is concerned where he will go after the hospital stay. Discussed with him plan to see if his act team can come visit and possibly assist. No other concerns voiced today.    Past Medical History:  Past Medical History:  Diagnosis Date   Diabetes mellitus without complication (HCC)    History reviewed. No pertinent surgical history. Family History: History reviewed. No pertinent family history. Family Psychiatric  History: None disclosed  Social History:  Social History   Substance and Sexual Activity  Alcohol Use Not Currently     Social History   Substance and Sexual Activity  Drug Use Not Currently    Social History   Socioeconomic History   Marital status: Single    Spouse name: Not on file   Number of children: Not on file   Years of education: Not on file   Highest education level: Not on file  Occupational History   Not on file  Tobacco Use  Smoking status: Unknown   Smokeless tobacco: Not on file  Substance and Sexual Activity   Alcohol use: Not Currently   Drug use: Not Currently   Sexual activity: Not Currently   Other Topics Concern   Not on file  Social History Narrative   Not on file   Social Drivers of Health   Financial Resource Strain: Not on file  Food Insecurity: Food Insecurity Present (08/03/2024)   Hunger Vital Sign    Worried About Running Out of Food in the Last Year: Sometimes true    Ran Out of Food in the Last Year: Sometimes true  Transportation Needs: Unmet Transportation Needs (08/03/2024)   PRAPARE - Administrator, Civil Service (Medical): Yes    Lack of Transportation (Non-Medical): Patient declined  Physical Activity: Not on file  Stress: Not on file  Social Connections: Patient Declined (08/03/2024)   Social Connection and Isolation Panel    Frequency of Communication with Friends and Family: Patient declined    Frequency of Social Gatherings with Friends and Family: Patient declined    Attends Religious Services: Patient declined    Database administrator or Organizations: Patient declined    Attends Banker Meetings: Patient declined    Marital Status: Patient declined   Additional Social History:   Regarding his educational background, the patient reports that he completed twelfth grade and 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 He reports that he is currently unemployed but has been approved for disability benefits for mental health and receives approximately $280 per month in EBT assistance. He identifies no one as a support system, states his religion is Christian-affiliated, and reports basketball as a hobby. He identifies as straight and male, denies any military background. Patient current has an Investment banker, operational.    Current Medications: Current Facility-Administered Medications  Medication Dose Route Frequency Provider Last Rate Last Admin   acetaminophen  (TYLENOL ) tablet 650 mg  650 mg Oral Q6H PRN Wilkie Majel RAMAN, FNP   650 mg at 08/05/24 2100   alum & mag hydroxide-simeth (MAALOX/MYLANTA)  200-200-20 MG/5ML suspension 30 mL  30 mL Oral Q4H PRN Starkes-Perry, Takia S, FNP       dextromethorphan-guaiFENesin (MUCINEX DM) 30-600 MG per 12 hr tablet 1 tablet  1 tablet Oral BID PRN Starkes-Perry, Takia S, FNP       haloperidol  (HALDOL ) tablet 5 mg  5 mg Oral TID PRN Wilkie Majel RAMAN, FNP       And   diphenhydrAMINE  (BENADRYL ) capsule 50 mg  50 mg Oral TID PRN Starkes-Perry, Majel RAMAN, FNP       haloperidol  lactate (HALDOL ) injection 5 mg  5 mg Intramuscular TID PRN Starkes-Perry, Majel RAMAN, FNP       And   diphenhydrAMINE  (BENADRYL ) injection 50 mg  50 mg Intramuscular TID PRN Starkes-Perry, Majel RAMAN, FNP       And   LORazepam  (ATIVAN ) injection 2 mg  2 mg Intramuscular TID PRN Starkes-Perry, Takia S, FNP       haloperidol  lactate (HALDOL ) injection 10 mg  10 mg Intramuscular TID PRN Wilkie Majel RAMAN, FNP       And   diphenhydrAMINE  (BENADRYL ) injection 50 mg  50 mg Intramuscular TID PRN Starkes-Perry, Majel RAMAN, FNP       And   LORazepam  (ATIVAN ) injection 2 mg  2 mg Intramuscular TID PRN Starkes-Perry, Majel RAMAN, FNP       docusate sodium  (COLACE) capsule 100  mg  100 mg Oral BID PRN Starkes-Perry, Takia S, FNP       feeding supplement (GLUCERNA SHAKE) (GLUCERNA SHAKE) liquid 237 mL  237 mL Oral TID BM Lenard Calin, MD   237 mL at 08/05/24 1536   haloperidol  (HALDOL ) tablet 10 mg  10 mg Oral QHS Starkes-Perry, Takia S, FNP   10 mg at 08/05/24 2100   haloperidol  decanoate (HALDOL  DECANOATE) 100 MG/ML injection 200 mg  200 mg Intramuscular Q28 days Lenard Calin, MD       insulin  aspart (novoLOG ) injection 0-20 Units  0-20 Units Subcutaneous TID WC Starkes-Perry, Takia S, FNP   15 Units at 08/05/24 1708   insulin  aspart (novoLOG ) injection 0-5 Units  0-5 Units Subcutaneous QHS Starkes-Perry, Takia S, FNP   3 Units at 08/04/24 2029   insulin  aspart (novoLOG ) injection 6 Units  6 Units Subcutaneous TID WC Perl Folmar N, MD   6 Units at 08/05/24 1709   insulin  glargine (LANTUS )  injection 18 Units  18 Units Subcutaneous BID Towana Leita SAILOR, MD   18 Units at 08/05/24 1712   loratadine (CLARITIN) tablet 10 mg  10 mg Oral Daily PRN Towana Leita SAILOR, MD       nicotine  (NICODERM CQ  - dosed in mg/24 hours) patch 21 mg  21 mg Transdermal Daily Wilkie Majel RAMAN, FNP   21 mg at 08/05/24 9040   sodium chloride  (OCEAN) 0.65 % nasal spray 1 spray  1 spray Each Nare PRN Wilkie Majel RAMAN, FNP       traZODone  (DESYREL ) tablet 50 mg  50 mg Oral QHS Starkes-Perry, Takia S, FNP   50 mg at 08/05/24 2100    Lab Results:  Results for orders placed or performed during the hospital encounter of 08/03/24 (from the past 48 hours)  Glucose, capillary     Status: Abnormal   Collection Time: 08/04/24 12:00 PM  Result Value Ref Range   Glucose-Capillary 467 (H) 70 - 99 mg/dL    Comment: Glucose reference range applies only to samples taken after fasting for at least 8 hours.  Glucose, capillary     Status: Abnormal   Collection Time: 08/04/24  5:00 PM  Result Value Ref Range   Glucose-Capillary 133 (H) 70 - 99 mg/dL    Comment: Glucose reference range applies only to samples taken after fasting for at least 8 hours.  Glucose, capillary     Status: Abnormal   Collection Time: 08/04/24  7:33 PM  Result Value Ref Range   Glucose-Capillary 261 (H) 70 - 99 mg/dL    Comment: Glucose reference range applies only to samples taken after fasting for at least 8 hours.  Glucose, capillary     Status: Abnormal   Collection Time: 08/05/24  6:26 AM  Result Value Ref Range   Glucose-Capillary 192 (H) 70 - 99 mg/dL    Comment: Glucose reference range applies only to samples taken after fasting for at least 8 hours.  Glucose, capillary     Status: Abnormal   Collection Time: 08/05/24 11:58 AM  Result Value Ref Range   Glucose-Capillary 345 (H) 70 - 99 mg/dL    Comment: Glucose reference range applies only to samples taken after fasting for at least 8 hours.  Glucose, capillary     Status:  Abnormal   Collection Time: 08/05/24  5:06 PM  Result Value Ref Range   Glucose-Capillary 328 (H) 70 - 99 mg/dL    Comment: Glucose reference range applies only to samples  taken after fasting for at least 8 hours.  Glucose, capillary     Status: None   Collection Time: 08/05/24  7:22 PM  Result Value Ref Range   Glucose-Capillary 98 70 - 99 mg/dL    Comment: Glucose reference range applies only to samples taken after fasting for at least 8 hours.  Glucose, capillary     Status: Abnormal   Collection Time: 08/06/24  6:06 AM  Result Value Ref Range   Glucose-Capillary 68 (L) 70 - 99 mg/dL    Comment: Glucose reference range applies only to samples taken after fasting for at least 8 hours.    Blood Alcohol level:  Lab Results  Component Value Date   [facility] <15 07/29/2024    Metabolic Disorder Labs: No results found for: HGBA1C, MPG No results found for: PROLACTIN No results found for: CHOL, TRIG, HDL, CHOLHDL, VLDL, LDLCALC  Physical Findings:  Mental Status exam: Appearance: black male of thin body habitus, appropriately groomed, seen with peers in the day room Eye contact: good - improved  Attitude towards examiner better engagement today, polite   Psychomotor: no agitation or retardation  Speech: reduced amount, quiet Language: no delays  Mood: good  Affect: remains somewhat restricted, but overall appropriate   Thought content: denying SI and HI, no delusions expressed  Thought Process: largely linear and organized  Perception: denying AVH, not RTIS  Insight: limited  Judgement: poor based on recent events    Orientation: x3 Attention/Concentration: fair to good - attends to interview  Memory/Cognition: grossly intact based on conversation   Fund of Knowledge: Average      Musculoskeletal: Strength & Muscle Tone: within normal limits Gait & Station: normal Patient leans: N/A Physical Exam Constitutional:      General: He is not in acute  distress.    Appearance: He is normal weight. He is not ill-appearing.  Pulmonary:     Effort: Pulmonary effort is normal.  Abdominal:     General: There is no distension.  Musculoskeletal:        General: Normal range of motion.  Neurological:     General: No focal deficit present.     Mental Status: He is alert.    Review of Systems  Constitutional:  Negative for chills and fever.  Respiratory:  Positive for cough.   Gastrointestinal:  Negative for nausea and vomiting.  Psychiatric/Behavioral:  Negative for depression, hallucinations and suicidal ideas. The patient is not nervous/anxious and does not have insomnia.    Blood pressure 123/80, pulse 81, temperature 98.2 F (36.8 C), temperature source Oral, resp. rate 16, height 5' 6 (1.676 m), weight 57.6 kg, SpO2 100%. Body mass index is 20.5 kg/m.   Treatment Plan Summary:  Daily contact with patient to assess and evaluate symptoms and progress in treatment     Assessment: The patient is a 33 y.o. male with a psychiatric history currently most consistent with schizoaffective disorder, bipolar type and unspecified stimulant use disorder. He has recurrent inpatient admissions for psychotic symptoms (and some reported manic symptoms) over the past several years. Although this is confounded by cocaine use and recurrent hospitalizations for DKA (often with encephalopathy) psychotic symptoms have included hallucinations, longstanding delusions, disorganizations in thoughts and behavior and poor self care. Prior notes have indicated mental health concerns contribute significantly to poor management of diabetes. Pattern of current/recent cocaine usage is not clear, but UDS was positive for cocaine on this admission. As he had been up-to-date on his haldol  decanoate LAI,  consult team at the hospital indicated breakthrough symptoms may be more related to substance use.   On this admission, he presents after transfer from hospital stay where  he was admitted with DKA for several days. During hospitalization noted to be expressing delusional content, agitated and with concern for breakthrough symptoms vs worsening of symptoms due to stimulant use. Supplemental haldol  10 mg was started on 10/1 with good response indicting a maintenance dose of 150-200 mg when next due (next due 08/08/24). However, upon arrival, patient did not demonstrate any overt evidence of psychosis and plan has been to administer haldol  decanoate 200 mg IM today and coordinate safe disposition in the next few days with ACT team.   Today the patient was calm and better engaged with afternoon meeting. He continued to deny all concerns and did not present with any overt signs of psychosis. He received his haldol  decanoate without issue. Will decrease nightly haldol  to 5 mg and plan to continue this for 15 days before stopping.   Mom is coming to visit today and could be contacted tomorrow regarding how he is doing. Blood sugars have still intermittently been scoring in the 400s, however morning fasting glucose was slightly low (68). Will not change insulin  regimen for now, however will plan to restrict some sugary snacks as daytime elevations appear related to diet.     DSM-5 diagnoses: Schizoaffective disorder, bipolar type  Stimulant use disorder, unspecified      Plan:   Legal Status: -voluntary   Safety -q15 minute checks  -elopement, suicide and assault precautions  -daily vitals   Psychiatric Concerns  -decrease PO haldol  to 5 mg qhS             -plan to continue for 10-15 days after next injection   -Haldol  decanoate 200 mg IM given today (10/6) - continue this dosage outpatient q28 days   Substance use concerns  -Cocaine - unspecified pattern of usage at this time   Nicotine  Replacement  Patch and gum available    Medical concerns -T1DM with recent admission for DKA:            -Insulin  Aspart 6 mg TID with meals            -Insuline Glargine  18 U BID             -High-sensitivity sliding scale 0-20 U            -will continue to monitor closely   Additional PRNs: -Tylenol  tablets 650 mg every 6 hours as needed for pain -Maalox/Mylanta suspension 30 mL every 4 hours as needed for indigestion  -Milk of Magnesia 30 mL daily as needed for constipation   Labs -Reviewed as documented in HPI   Psychosocial interventions  -Motivational interviewing  -daily medication management with psychiatry -Medication education regarding risks/benefits and alternatives -bedside psychotherapy as indicated  -Patient will be encouraged to participate and engage with group therapy  -Appreciate SW assistance in coordinating safe disposition    Leita LOISE Arts, MD 08/06/2024, 8:21 AM

## 2024-08-06 NOTE — Group Note (Signed)
 Date:  08/06/2024 Time:  9:02 PM  Group Topic/Focus:  Wrap-Up Group:   The focus of this group is to help patients review their daily goal of treatment and discuss progress on daily workbooks.    Participation Level:  Active  Participation Quality:  Appropriate  Affect:  Appropriate  Cognitive:  Appropriate  Insight: Appropriate  Engagement in Group:  Engaged  Modes of Intervention:  Education and Exploration  Additional Comments:  Patient attended and participated in group tonight. He reports that today he learn about pressure points on his body.  Gwenn Chillington Dacosta 08/06/2024, 9:02 PM

## 2024-08-06 NOTE — BH IP Treatment Plan (Signed)
 Interdisciplinary Treatment and Diagnostic Plan Update  08/06/2024 Time of Session: 11:10AM Jacob [name] MRN: [id number]  Principal Diagnosis: Schizoaffective disorder, bipolar type (HCC)  Secondary Diagnoses: Principal Problem:   Schizoaffective disorder, bipolar type (HCC) Active Problems:   Oth stimulant use, unsp w stim-induce psych disorder, unsp (HCC)   Current Medications:  Current Facility-Administered Medications  Medication Dose Route Frequency Provider Last Rate Last Admin   acetaminophen  (TYLENOL ) tablet 650 mg  650 mg Oral Q6H PRN Wilkie Majel RAMAN, FNP   650 mg at 08/05/24 2100   alum & mag hydroxide-simeth (MAALOX/MYLANTA) 200-200-20 MG/5ML suspension 30 mL  30 mL Oral Q4H PRN Starkes-Perry, Majel RAMAN, FNP       dextromethorphan-guaiFENesin (MUCINEX DM) 30-600 MG per 12 hr tablet 1 tablet  1 tablet Oral BID PRN Starkes-Perry, Majel RAMAN, FNP       haloperidol  (HALDOL ) tablet 5 mg  5 mg Oral TID PRN Wilkie Majel RAMAN, FNP       And   diphenhydrAMINE  (BENADRYL ) capsule 50 mg  50 mg Oral TID PRN Wilkie Majel RAMAN, FNP       haloperidol  lactate (HALDOL ) injection 5 mg  5 mg Intramuscular TID PRN Starkes-Perry, Majel RAMAN, FNP       And   diphenhydrAMINE  (BENADRYL ) injection 50 mg  50 mg Intramuscular TID PRN Starkes-Perry, Majel RAMAN, FNP       And   LORazepam  (ATIVAN ) injection 2 mg  2 mg Intramuscular TID PRN Starkes-Perry, Majel RAMAN, FNP       haloperidol  lactate (HALDOL ) injection 10 mg  10 mg Intramuscular TID PRN Starkes-Perry, Majel RAMAN, FNP       And   diphenhydrAMINE  (BENADRYL ) injection 50 mg  50 mg Intramuscular TID PRN Starkes-Perry, Majel RAMAN, FNP       And   LORazepam  (ATIVAN ) injection 2 mg  2 mg Intramuscular TID PRN Starkes-Perry, Majel RAMAN, FNP       docusate sodium  (COLACE) capsule 100 mg  100 mg Oral BID PRN Starkes-Perry, Takia S, FNP       feeding supplement (GLUCERNA SHAKE) (GLUCERNA SHAKE) liquid 237 mL  237 mL Oral TID BM Lenard Calin, MD   237 mL  at 08/06/24 9061   haloperidol  (HALDOL ) tablet 5 mg  5 mg Oral QHS Towana Leita SAILOR, MD       haloperidol  decanoate (HALDOL  DECANOATE) 100 MG/ML injection 200 mg  200 mg Intramuscular Q28 days Lenard Calin, MD   200 mg at 08/06/24 9157   insulin  aspart (novoLOG ) injection 0-20 Units  0-20 Units Subcutaneous TID WC Starkes-Perry, Takia S, FNP   20 Units at 08/06/24 1206   insulin  aspart (novoLOG ) injection 0-5 Units  0-5 Units Subcutaneous QHS Wilkie Majel RAMAN, FNP   3 Units at 08/04/24 2029   insulin  aspart (novoLOG ) injection 6 Units  6 Units Subcutaneous TID WC Towana Leita SAILOR, MD   6 Units at 08/06/24 1206   insulin  glargine (LANTUS ) injection 18 Units  18 Units Subcutaneous BID Towana Leita SAILOR, MD   18 Units at 08/06/24 0839   loratadine (CLARITIN) tablet 10 mg  10 mg Oral Daily PRN Towana Leita SAILOR, MD       nicotine  (NICODERM CQ  - dosed in mg/24 hours) patch 21 mg  21 mg Transdermal Daily Wilkie Majel RAMAN, FNP   21 mg at 08/06/24 9166   sodium chloride  (OCEAN) 0.65 % nasal spray 1 spray  1 spray Each Nare PRN Wilkie Majel RAMAN, FNP  traZODone  (DESYREL ) tablet 50 mg  50 mg Oral QHS Starkes-Perry, Takia S, FNP   50 mg at 08/05/24 2100   PTA Medications: Medications Prior to Admission  Medication Sig Dispense Refill Last Dose/Taking   insulin  aspart (NOVOLOG ) 100 UNIT/ML FlexPen Inject 6 Units into the skin 3 (three) times daily with meals. 9 mL 0    insulin  glargine (LANTUS ) 100 UNIT/ML Solostar Pen Inject 18 Units into the skin 2 (two) times daily. 9 mL 0     Patient Stressors: Financial difficulties   Health problems   Medication change or noncompliance    Patient Strengths: Ability for insight  Supportive family/friends   Treatment Modalities: Medication Management, Group therapy, Case management,  1 to 1 session with clinician, Psychoeducation, Recreational therapy.   Physician Treatment Plan for Primary Diagnosis: Schizoaffective disorder, bipolar type  (HCC) Long Term Goal(s):     Short Term Goals:    Medication Management: Evaluate patient's response, side effects, and tolerance of medication regimen.  Therapeutic Interventions: 1 to 1 sessions, Unit Group sessions and Medication administration.  Evaluation of Outcomes: Not Progressing  Physician Treatment Plan for Secondary Diagnosis: Principal Problem:   Schizoaffective disorder, bipolar type (HCC) Active Problems:   Oth stimulant use, unsp w stim-induce psych disorder, unsp (HCC)  Long Term Goal(s):     Short Term Goals:       Medication Management: Evaluate patient's response, side effects, and tolerance of medication regimen.  Therapeutic Interventions: 1 to 1 sessions, Unit Group sessions and Medication administration.  Evaluation of Outcomes: Not Progressing   RN Treatment Plan for Primary Diagnosis: Schizoaffective disorder, bipolar type (HCC) Long Term Goal(s): Knowledge of disease and therapeutic regimen to maintain health will improve  Short Term Goals: Ability to remain free from injury will improve, Ability to verbalize frustration and anger appropriately will improve, Ability to demonstrate self-control, Ability to participate in decision making will improve, Ability to verbalize feelings will improve, and Compliance with prescribed medications will improve  Medication Management: RN will administer medications as ordered by provider, will assess and evaluate patient's response and provide education to patient for prescribed medication. RN will report any adverse and/or side effects to prescribing provider.  Therapeutic Interventions: 1 on 1 counseling sessions, Psychoeducation, Medication administration, Evaluate responses to treatment, Monitor vital signs and CBGs as ordered, Perform/monitor CIWA, COWS, AIMS and Fall Risk screenings as ordered, Perform wound care treatments as ordered.  Evaluation of Outcomes: Not Progressing   LCSW Treatment Plan for Primary  Diagnosis: Schizoaffective disorder, bipolar type (HCC) Long Term Goal(s): Safe transition to appropriate next level of care at discharge, Engage patient in therapeutic group addressing interpersonal concerns.  Short Term Goals: Engage patient in aftercare planning with referrals and resources, Increase social support, Increase ability to appropriately verbalize feelings, Increase emotional regulation, Facilitate acceptance of mental health diagnosis and concerns, and Increase skills for wellness and recovery  Therapeutic Interventions: Assess for all discharge needs, 1 to 1 time with Social worker, Explore available resources and support systems, Assess for adequacy in community support network, Educate family and significant other(s) on suicide prevention, Complete Psychosocial Assessment, Interpersonal group therapy.  Evaluation of Outcomes: Not Progressing   Progress in Treatment: Attending groups: Yes. Participating in groups: Yes. Taking medication as prescribed: Yes. Toleration medication: Yes. Family/Significant other contact made: No, will contact:  [name] (Mom) [phone] Patient understands diagnosis: Yes. Discussing patient identified problems/goals with staff: Yes. Medical problems stabilized or resolved: Yes. Denies suicidal/homicidal ideation: Yes. Issues/concerns per patient self-inventory:  No.   Patient Goals:  connecting with my mom and getting housing.  Discharge Plan or Barriers: Patient will likely discharge to shelter once stable.   Reason for Continuation of Hospitalization: Delusions  Medication stabilization  Estimated Length of Stay: 5-7 days  Last 3 [place] Suicide Severity Risk Score: Flowsheet Row Ad[facility] (Current) [facility] (Discharged) from 07/29/2024 in [facility]  C-SSRS RISK CATEGORY No Risk No Risk    Last PHQ 2/9 Scores:     No data to  display          Scribe for Treatment Team: Charvi Gammage M Rudolfo Brandow, LCSWA 08/06/2024 4:42 PM

## 2024-08-06 NOTE — Progress Notes (Signed)
 Recreation Therapy Notes  INPATIENT RECREATION THERAPY ASSESSMENT  Patient Details Name: Corney Knighton MRN: [id number] DOB: 1990/12/22 Today's Date: 08/06/2024       Information Obtained From: Patient  Able to Participate in Assessment/Interview: Yes  Patient Presentation: Alert  Reason for Admission (Per Patient): Other (Comments) (ketoacidosis)  Patient Stressors: Other (Comment) (homeless)  Coping Skills:   Isolation, TV, Sports, Exercise, Music, Art, Prayer, Substance Abuse, Meditate, Deep Breathing, Avoidance, Read, Hot Bath/Shower  Leisure Interests (2+):  Exercise - Walking, Sports - Basketball  Frequency of Recreation/Participation: Other (Comment) (Often)  Awareness of Community Resources:  Yes  Community Resources:  Library, [employer]  Current Use: Yes  If no, Barriers?:    Expressed Interest in [employer] Information: No  [employer] of Residence:  [place]  Patient Main Form of Transportation: Walk  Patient Strengths:  Walking  Patient Identified Areas of Improvement:  Connecting, Communicating, Compassion  Patient Goal for Hospitalization:  to communicate out instead of trapping myself in  Current SI (including self-harm):  No  Current HI:  No  Current AVH: No  Staff Intervention Plan: Group Attendance, Collaborate with Interdisciplinary Treatment Team  Consent to Intern Participation: N/A   Terin Cragle-McCall, LRT,CTRS Merida Alcantar A Matteson Blue-McCall 08/06/2024, 3:14 PM

## 2024-08-06 NOTE — BH Assessment (Signed)
(  Sleep Hours) - 10.25 (Any PRNs that were needed, meds refused, or side effects to meds)-  (Any disturbances and when (visitation, over night)- None (Concerns raised by the patient)- Pt request to be re-assessed to be taken off airdrops precaution  (SI/HI/AVH)-

## 2024-08-06 NOTE — BHH Counselor (Signed)
 CSW faxed referral for transitional housing to [facility] at the request of the patient. Patient given contact number for [facility] to follow-up with them on 08/07/24; [phone].  Ebonye Reade, LCSWA 08/06/24 4:02PM

## 2024-08-06 NOTE — Group Note (Signed)
 Recreation Therapy Group Note   Group Topic:Coping Skills  Group Date: 08/06/2024 Start Time: 1010 End Time: 1040 Facilitators: Trysta Showman-McCall, LRT,CTRS Location: [address] Dayroom   Group Topic: Coping Skills   Goal Area(s) Addresses: Patient will define what a coping skill is. Patient will work with peer to create a list of healthy coping skills beginning with each letter of the alphabet. Patient will successfully identify positive coping skills they can use post d/c.  Patient will acknowledge benefit(s) of using learned coping skills post d/c.  Behavioral Response: None   Intervention: Worksheet   Activity: Coping A to Z. Patient asked to identify what a coping skill is and when they use them. Patients with Clinical research associate discussed healthy versus unhealthy coping skills. Next patients were given a blank worksheet titled Coping Skills A-Z.  Partners were instructed to come up with at least one positive coping skill per letter of the alphabet. Patients were given 15 minutes to brainstorm before ideas were presented to the large group. Patients and LRT debriefed on the importance of coping skill selection based on situation and back-up plans when a skill tried is not effective. At the end of group, patients were given an handout of alphabetized strategies to keep for future reference.   Education: Pharmacologist, Scientist, physiological, Discharge Planning.    Education Outcome: Acknowledges education/Verbalizes understanding/In group clarification offered/Additional education needed   Affect/Mood: Appropriate   Participation Level: None   Participation Quality: None   Behavior: On-looking   Speech/Thought Process: None   Insight: None   Judgement: None   Modes of Intervention: Worksheet   Patient Response to Interventions:  Disengaged   Education Outcome:  In group clarification offered    Clinical Observations/Individualized Feedback: Pt came into group as peers were  finishing up identifying their coping skills.     Plan: Continue to engage patient in RT group sessions 2-3x/week.   Tyra Gural-McCall, LRT,CTRS 08/06/2024 1:00 PM

## 2024-08-06 NOTE — Progress Notes (Signed)
 Patient is alert, oriented, pleasant, and cooperative. Denies SI, HI, AVH, and verbally contracts for safety. Patient reports he slept good last night without sleeping medication. Patient reports his appetite as good, energy level as normal, and concentration as good. Patient rates his depression 0/10, hopelessness 0/10, and anxiety 0/10. Patient denies physical symptoms/pain.   Patient taken off droplet precautions per infectious disease.  1144 CBG 464 26 units novolog  administered. 1707 CBG 262.   Scheduled medications administered per MD order, including LAI. Support provided. Patient educated on safety on the unit and medications. Routine safety checks every 15 minutes. Patient stated understanding to tell nurse about any new physical symptoms. Patient understands to tell staff of any needs.     No adverse drug reactions noted. Patient remains safe at this time and will continue to monitor.    08/06/24 1000  Psych Admission Type (Psych Patients Only)  Admission Status Voluntary  Psychosocial Assessment  Patient Complaints None  Eye Contact Fair  Facial Expression Animated  Affect Appropriate to circumstance  Speech Logical/coherent  Interaction Assertive  Motor Activity Other (Comment) (WNL)  Appearance/Hygiene Unremarkable  Behavior Characteristics Cooperative;Calm  Mood Pleasant  Thought Process  Coherency WDL  Content WDL  Delusions None reported or observed  Perception WDL  Hallucination None reported or observed  Judgment Impaired  Confusion None  Danger to Self  Current suicidal ideation? Denies  Danger to Others  Danger to Others None reported or observed

## 2024-08-06 NOTE — Plan of Care (Signed)

## 2024-08-07 LAB — GLUCOSE, CAPILLARY
Glucose-Capillary: 79 mg/dL (ref 70–99)
Glucose-Capillary: 398 mg/dL — ABNORMAL HIGH (ref 70–99)
Glucose-Capillary: 300 mg/dL — ABNORMAL HIGH (ref 70–99)
Glucose-Capillary: 311 mg/dL — ABNORMAL HIGH (ref 70–99)

## 2024-08-07 NOTE — Telephone Encounter (Signed)
 Copied from CRM [id number]. Topic: General - Other >> Aug 07, 2024  8:55 AM Jacob [name] wrote: Reason for CRM: Patient has an appointment on the 9th but he is in a behavioral center right now, his mom is not sure of his discharge date. He might get released today so she doesn't want to cancel his appointment.

## 2024-08-07 NOTE — Group Note (Signed)
 Date:  08/07/2024 Time:  8:51 PM  Group Topic/Focus:  Wrap-Up Group:   The focus of this group is to help patients review their daily goal of treatment and discuss progress on daily workbooks.    Participation Level:  Active  Participation Quality:  Appropriate  Affect:  Appropriate  Cognitive:  Appropriate  Insight: Appropriate  Engagement in Group:  Developing/Improving  Modes of Intervention:  Discussion  Additional Comments:  Pt stated his goal for today was to focus on his treatment plan and kept his blood sugar in normal range. Pt stated he accomplished his goals today. Pt stated he talked with his doctor and social worker about his care today. Pt rated his overall day a 10. Pt stated he contact his mother today which improved his overall day. Pt stated he felt better about himself today. Pt stated he was able to attend all meals. Pt stated he took all medications provided today. Pt stated he attend all groups held today. Pt stated his appetite was pretty good today. Pt rated sleep last night was pretty good. Pt stated the goal tonight was to get some rest. Pt stated he had no physical pain tonight. Pt deny visual hallucinations and auditory issues tonight. Pt denies thoughts of harming himself or others. Pt stated he would alert staff if anything changed  Lonni Na 08/07/2024, 8:51 PM

## 2024-08-07 NOTE — BH Assessment (Signed)
(  Sleep Hours) - 9.75 (Any PRNs that were needed, meds refused, or side effects to meds)-  (Any disturbances and when (visitation, over night)- None (Concerns raised by the patient)- None (SI/HI/AVH)- Denies

## 2024-08-07 NOTE — Plan of Care (Signed)
   Problem: Education: Goal: Knowledge of [place] [facility] Education information/materials will improve Outcome: Progressing Goal: Emotional status will improve Outcome: Progressing Goal: Mental status will improve Outcome: Progressing Goal: Verbalization of understanding the information provided will improve Outcome: Progressing

## 2024-08-07 NOTE — Plan of Care (Signed)
   Problem: Education: Goal: Emotional status will improve Outcome: Progressing Goal: Mental status will improve Outcome: Progressing   Problem: Activity: Goal: Interest or engagement in activities will improve Outcome: Progressing Goal: Sleeping patterns will improve Outcome: Progressing   Problem: Safety: Goal: Periods of time without injury will increase Outcome: Progressing

## 2024-08-07 NOTE — BHH Counselor (Signed)
 CSW met with patient to discuss discharge planning. Patient reported meeting with his ACT team went well earlier today and that they are in the process of trying to secure housing for him. He reported this may take time and has been in contact with his mom for assistance. He stated he plans to call more shelters today regarding bed availability. CSW to check in with patient on 10/08 regarding progress.  Lorenia Hoston, LCSWA 08/07/24 3:06PM

## 2024-08-07 NOTE — Plan of Care (Addendum)
 Pt presents with congruent affect and mood. Denies SI, HI and AVH when assessed. Received PRN Tylenol  650 mg PO for c/o back pain with desired effect when reassessed at 1340. Visited by [employer] staff ([facility]) and placement is still an issue. Pt remains compliant with medications when offered, denies adverse drug reactions when assessed. Cooperative with CBG monitoring and continues to need verbal education on appropriate choices for snacks , meals to maintained a stable glucose reading. Tolerates meals and fluids well. Q 15 minutes safety checks maintained without outburst. Support, encouragement and reassurance offered to pt.  Problem: Education: Goal: Emotional status will improve Outcome: Progressing   Problem: Activity: Goal: Sleeping patterns will improve Outcome: Progressing   Problem: Health Behavior/Discharge Planning: Goal: Compliance with treatment plan for underlying cause of condition will improve Outcome: Progressing

## 2024-08-07 NOTE — Group Note (Signed)
 Recreation Therapy Group Note   Group Topic:Leisure Education  Group Date: 08/07/2024 Start Time: 1040 End Time: 1105 Facilitators: Demarus Latterell-McCall, LRT,CTRS Location: [address] Dayroom   Group Topic: Leisure Education   Goal Area(s) Addresses:  Patient will successfully identify positive leisure and recreation activities.  Patient will acknowledge benefits of participation in healthy leisure activities post discharge.  Patient will actively work with peers toward a shared goal.   Behavioral Response:    Intervention: Cooperative Group Game    Activity: Keep It Contractor. LRT and patients discussed what leisure was and what prevents people from taking time for leisure. The group then engaged in a game of volleyball. The object of the game is to keep the ball in motion at all times. The ball can be bounced off the floor but it can't come to a complete stop. LRT will be timing the group as they play the game. If the ball were to come to a stop, the time would start over.   Education:  Teacher, English as a foreign language, Leisure as Merchant navy officer, Programmer, applications, Building control surveyor   Education Outcome: Acknowledges education/In group clarification offered/Needs additional education   Affect/Mood: N/A   Participation Level: Did not attend    Clinical Observations/Individualized Feedback:      Plan: Continue to engage patient in RT group sessions 2-3x/week.   Tyland Klemens-McCall, LRT,CTRS 08/07/2024 1:44 PM

## 2024-08-07 NOTE — Progress Notes (Signed)
 [facility] MD Progress Note  08/07/2024 3:44 PM Jacob [name]  MRN:  [id number] Subjective:   Jacob [name] is a 33 y.o. male (homeless, unemployed) with a medical history of T1DM and a psychiatric history of schizoaffective disorder bipolar-type admitted voluntarily to [name] following medical admission for DKA.    Case was discussed in the multidisciplinary team. MAR was reviewed and patient was compliant with medications.  He received no PRN medications yesterday,   Psychiatric Team made the following recommendations yesterday: -Decrease PO haldol  to 5 mg qhS  -Haldol  decanoate 200 mg IM given (10/6)   On interview today patient reports he slept good last night.  He reports his appetite is doing good.  He reports no SI, HI, or AVH.  He reports no Paranoia or Ideas of Reference.  He reports no issues with his medications.  He reports that his ACT Team did visit today and it went well.  He reports no other concerns at present.  Principal Problem: Schizoaffective disorder, bipolar type (HCC) Diagnosis: Principal Problem:   Schizoaffective disorder, bipolar type (HCC) Active Problems:   Oth stimulant use, unsp w stim-induce psych disorder, unsp (HCC)  Total Time spent with patient:  I personally spent 35 minutes on the unit in direct patient care. The direct patient care time included face-to-face time with the patient, reviewing the patient's chart, communicating with other professionals, and coordinating care.    Past Psychiatric History:   numerous admissions over the last several years for psychotic symptoms including delusional content, hallucinations and agitated behavior [facility], old [place], possibly [employer], [employer] and [facility] ). Most recently admitted to [facility] February 2025. Psychotic symptoms have been confounded by substance use and poorly controled diabetes. He has had numerous admissions for DKA, some with noted encephalopathy. Previous diagnoses  include schizoaffective disorder, Schizophrenia, bipolar disorder, stimulant use disorder (cocaine). It has been noted that mental illness appears to prevent management of diabetes to some degree and he has been following with an ACTT team (see number below). He has one prior suicide attempt in 2019. No self-harming behaviors. No history of trauma or anxiety symptoms. Usage of stimulants (cocaine) noted at various times with unclear pattern.   Past Medical History:  Past Medical History:  Diagnosis Date   Diabetes mellitus without complication (HCC)    History reviewed. No pertinent surgical history. Family History: History reviewed. No pertinent family history. Family Psychiatric  History:  None Reported  Social History:  Social History   Substance and Sexual Activity  Alcohol Use Not Currently     Social History   Substance and Sexual Activity  Drug Use Not Currently    Social History   Socioeconomic History   Marital status: Single    Spouse name: Not on file   Number of children: Not on file   Years of education: Not on file   Highest education level: Not on file  Occupational History   Not on file  Tobacco Use   Smoking status: Unknown   Smokeless tobacco: Not on file  Substance and Sexual Activity   Alcohol use: Not Currently   Drug use: Not Currently   Sexual activity: Not Currently  Other Topics Concern   Not on file  Social History Narrative   Not on file   Social Drivers of Health   Financial Resource Strain: Not on file  Food Insecurity: Food Insecurity Present (08/03/2024)   Hunger Vital Sign    Worried About Running Out of Food in  the Last Year: Sometimes true    Ran Out of Food in the Last Year: Sometimes true  Transportation Needs: Unmet Transportation Needs (08/03/2024)   PRAPARE - Administrator, Civil Service (Medical): Yes    Lack of Transportation (Non-Medical): Patient declined  Physical Activity: Not on file  Stress: Not on file   Social Connections: Patient Declined (08/03/2024)   Social Connection and Isolation Panel    Frequency of Communication with Friends and Family: Patient declined    Frequency of Social Gatherings with Friends and Family: Patient declined    Attends Religious Services: Patient declined    Database administrator or Organizations: Patient declined    Attends Engineer, structural: Patient declined    Marital Status: Patient declined   Additional Social History:                         Sleep: Good Estimated Sleeping Duration (Last 24 Hours): 9.50-12.50 hours  Appetite:  Good  Current Medications: Current Facility-Administered Medications  Medication Dose Route Frequency Provider Last Rate Last Admin   acetaminophen  (TYLENOL ) tablet 650 mg  650 mg Oral Q6H PRN Wilkie Majel RAMAN, FNP   650 mg at 08/07/24 1242   alum & mag hydroxide-simeth (MAALOX/MYLANTA) 200-200-20 MG/5ML suspension 30 mL  30 mL Oral Q4H PRN Starkes-Perry, Majel RAMAN, FNP       dextromethorphan-guaiFENesin (MUCINEX DM) 30-600 MG per 12 hr tablet 1 tablet  1 tablet Oral BID PRN Starkes-Perry, Takia S, FNP       haloperidol  (HALDOL ) tablet 5 mg  5 mg Oral TID PRN Wilkie Majel RAMAN, FNP       And   diphenhydrAMINE  (BENADRYL ) capsule 50 mg  50 mg Oral TID PRN Starkes-Perry, Majel RAMAN, FNP       haloperidol  lactate (HALDOL ) injection 5 mg  5 mg Intramuscular TID PRN Starkes-Perry, Majel RAMAN, FNP       And   diphenhydrAMINE  (BENADRYL ) injection 50 mg  50 mg Intramuscular TID PRN Starkes-Perry, Majel RAMAN, FNP       And   LORazepam  (ATIVAN ) injection 2 mg  2 mg Intramuscular TID PRN Starkes-Perry, Majel RAMAN, FNP       haloperidol  lactate (HALDOL ) injection 10 mg  10 mg Intramuscular TID PRN Starkes-Perry, Majel RAMAN, FNP       And   diphenhydrAMINE  (BENADRYL ) injection 50 mg  50 mg Intramuscular TID PRN Starkes-Perry, Majel RAMAN, FNP       And   LORazepam  (ATIVAN ) injection 2 mg  2 mg Intramuscular TID PRN  Starkes-Perry, Majel RAMAN, FNP       docusate sodium  (COLACE) capsule 100 mg  100 mg Oral BID PRN Starkes-Perry, Takia S, FNP       feeding supplement (GLUCERNA SHAKE) (GLUCERNA SHAKE) liquid 237 mL  237 mL Oral TID BM Lenard Calin, MD   237 mL at 08/07/24 1312   haloperidol  (HALDOL ) tablet 5 mg  5 mg Oral QHS Towana Leita SAILOR, MD   5 mg at 08/06/24 2115   haloperidol  decanoate (HALDOL  DECANOATE) 100 MG/ML injection 200 mg  200 mg Intramuscular Q28 days Lenard Calin, MD   200 mg at 08/06/24 9157   insulin  aspart (novoLOG ) injection 0-20 Units  0-20 Units Subcutaneous TID WC Wilkie Majel RAMAN, FNP   20 Units at 08/07/24 1209   insulin  aspart (novoLOG ) injection 0-5 Units  0-5 Units Subcutaneous QHS Wilkie Majel RAMAN, FNP   3 Units  at 08/04/24 2029   insulin  aspart (novoLOG ) injection 6 Units  6 Units Subcutaneous TID WC Butler, Laura N, MD   6 Units at 08/07/24 1211   insulin  glargine (LANTUS ) injection 18 Units  18 Units Subcutaneous BID Butler, Laura N, MD   18 Units at 08/07/24 0858   loratadine (CLARITIN) tablet 10 mg  10 mg Oral Daily PRN Towana Leita SAILOR, MD       nicotine  (NICODERM CQ  - dosed in mg/24 hours) patch 21 mg  21 mg Transdermal Daily Wilkie Majel RAMAN, FNP   21 mg at 08/06/24 9166   sodium chloride  (OCEAN) 0.65 % nasal spray 1 spray  1 spray Each Nare PRN Wilkie Majel RAMAN, FNP       traZODone  (DESYREL ) tablet 50 mg  50 mg Oral QHS Starkes-Perry, Takia S, FNP   50 mg at 08/06/24 2115    Lab Results:  Results for orders placed or performed during the hospital encounter of 08/03/24 (from the past 48 hours)  Glucose, capillary     Status: Abnormal   Collection Time: 08/05/24  5:06 PM  Result Value Ref Range   Glucose-Capillary 328 (H) 70 - 99 mg/dL    Comment: Glucose reference range applies only to samples taken after fasting for at least 8 hours.  Glucose, capillary     Status: None   Collection Time: 08/05/24  7:22 PM  Result Value Ref Range    Glucose-Capillary 98 70 - 99 mg/dL    Comment: Glucose reference range applies only to samples taken after fasting for at least 8 hours.  Glucose, capillary     Status: Abnormal   Collection Time: 08/06/24  6:06 AM  Result Value Ref Range   Glucose-Capillary 68 (L) 70 - 99 mg/dL    Comment: Glucose reference range applies only to samples taken after fasting for at least 8 hours.  Glucose, capillary     Status: Abnormal   Collection Time: 08/06/24 11:44 AM  Result Value Ref Range   Glucose-Capillary 464 (H) 70 - 99 mg/dL    Comment: Glucose reference range applies only to samples taken after fasting for at least 8 hours.  Glucose, capillary     Status: Abnormal   Collection Time: 08/06/24  5:07 PM  Result Value Ref Range   Glucose-Capillary 262 (H) 70 - 99 mg/dL    Comment: Glucose reference range applies only to samples taken after fasting for at least 8 hours.  Glucose, capillary     Status: Abnormal   Collection Time: 08/06/24  7:37 PM  Result Value Ref Range   Glucose-Capillary 146 (H) 70 - 99 mg/dL    Comment: Glucose reference range applies only to samples taken after fasting for at least 8 hours.  Glucose, capillary     Status: None   Collection Time: 08/07/24  6:18 AM  Result Value Ref Range   Glucose-Capillary 79 70 - 99 mg/dL    Comment: Glucose reference range applies only to samples taken after fasting for at least 8 hours.  Glucose, capillary     Status: Abnormal   Collection Time: 08/07/24 12:04 PM  Result Value Ref Range   Glucose-Capillary 398 (H) 70 - 99 mg/dL    Comment: Glucose reference range applies only to samples taken after fasting for at least 8 hours.    Blood Alcohol level:  Lab Results  Component Value Date   [facility] <15 07/29/2024    Metabolic Disorder Labs: No results found for: HGBA1C, MPG  No results found for: PROLACTIN No results found for: CHOL, TRIG, HDL, CHOLHDL, VLDL, LDLCALC  Physical Findings: AIMS:  ,  ,  ,  ,  ,  ,    CIWA:    COWS:     Musculoskeletal: Strength & Muscle Tone: within normal limits Gait & Station: normal Patient leans: N/A  Psychiatric Specialty Exam:  Presentation  General Appearance:  Appropriate for Environment; Casual  Eye Contact: Good  Speech: Clear and Coherent; Normal Rate  Speech Volume: Normal  Handedness: Right   Mood and Affect  Mood: Euthymic  Affect: Appropriate; Congruent   Thought Process  Thought Processes: Coherent; Goal Directed  Descriptions of Associations:Intact  Orientation:Full (Time, Place and Person)  Thought Content:Logical; WDL  History of Schizophrenia/Schizoaffective disorder:No  Duration of Psychotic Symptoms:Greater than six months  Hallucinations:Hallucinations: None  Ideas of Reference:None  Suicidal Thoughts:Suicidal Thoughts: No  Homicidal Thoughts:Homicidal Thoughts: No   Sensorium  Memory: Immediate Fair; Recent Fair  Judgment: Fair  Insight: Fair   Art therapist  Concentration: Fair  Attention Span: Fair  Recall: [employer] of Knowledge: Fair  Language: Fair   Psychomotor Activity  Psychomotor Activity: Psychomotor Activity: Normal   Assets  Assets: Communication Skills; Resilience; Physical Health; Social Support   Sleep  Sleep: Sleep: Good    Physical Exam: Physical Exam Vitals and nursing note reviewed.  Constitutional:      General: He is not in acute distress.    Appearance: Normal appearance. He is normal weight. He is not ill-appearing or toxic-appearing.  HENT:     Head: Normocephalic and atraumatic.  Pulmonary:     Effort: Pulmonary effort is normal.  Musculoskeletal:        General: Normal range of motion.  Neurological:     General: No focal deficit present.     Mental Status: He is alert.    Review of Systems  Respiratory:  Negative for cough and shortness of breath.   Cardiovascular:  Negative for chest pain.  Gastrointestinal:   Negative for abdominal pain, constipation, diarrhea, nausea and vomiting.  Neurological:  Negative for dizziness, weakness and headaches.  Psychiatric/Behavioral:  Negative for depression, hallucinations and suicidal ideas. The patient is not nervous/anxious.    Blood pressure 131/84, pulse 81, temperature 98.3 F (36.8 C), temperature source Oral, resp. rate 16, height 5' 6 (1.676 m), weight 57.6 kg, SpO2 100%. Body mass index is 20.5 kg/m.   Treatment Plan Summary: Daily contact with patient to assess and evaluate symptoms and progress in treatment and Medication management  Jacob [name] is a 33 y.o. male (homeless, unemployed) with a medical history of T1DM and a psychiatric history of schizoaffective disorder bipolar-type admitted voluntarily to [facility] following medical admission for DKA.    Jacob [name] is continuing to do well.  His ACT Team visited him today and they are working on housing for him while he has been calling shelters.  We will not make any changes to his medications at this time.  We will continue to monitor.    DSM-5 diagnoses: Schizoaffective disorder, bipolar type  Stimulant use disorder, unspecified      Plan:   Legal Status: -voluntary   Safety -q15 minute checks  -elopement, suicide and assault precautions  -daily vitals   Psychiatric Concerns  -Continue PO haldol  to 5 mg qhS             -plan to continue for 10-15 days after next injection    -Haldol  decanoate 200 mg IM given (  10/6) - continue this dosage outpatient q28 days   Substance use concerns  -Cocaine - unspecified pattern of usage at this time   Nicotine  Replacement  Patch and gum available    Medical concerns -T1DM with recent admission for DKA:            -Insulin  Aspart 6 mg TID with meals            -Insuline Glargine 18 U BID             -High-sensitivity sliding scale 0-20 U            -will continue to monitor closely   Additional PRNs: -Tylenol  tablets 650 mg every 6 hours as  needed for pain -Maalox/Mylanta suspension 30 mL every 4 hours as needed for indigestion  -Milk of Magnesia 30 mL daily as needed for constipation   Labs -Reviewed as documented in HPI   Psychosocial interventions  -Motivational interviewing  -daily medication management with psychiatry -Medication education regarding risks/benefits and alternatives -bedside psychotherapy as indicated  -Patient will be encouraged to participate and engage with group therapy  -Appreciate SW assistance in coordinating safe disposition   Jacob GORMAN Rosser, DO 08/07/2024, 3:44 PM

## 2024-08-07 NOTE — Progress Notes (Signed)
(  Sleep Hours) -15  (Any PRNs that were needed, meds refused, or side effects to meds)- n/a  (Any disturbances and when (visitation, over night)-none  (Concerns raised by the patient)- none, Ask me 3 discussed with pt and verbal understanding stated.    (SI/HI/AVH)-denies

## 2024-08-08 LAB — GLUCOSE, CAPILLARY
Glucose-Capillary: 76 mg/dL (ref 70–99)
Glucose-Capillary: 426 mg/dL — ABNORMAL HIGH (ref 70–99)
Glucose-Capillary: 397 mg/dL — ABNORMAL HIGH (ref 70–99)
Glucose-Capillary: 186 mg/dL — ABNORMAL HIGH (ref 70–99)

## 2024-08-08 NOTE — Group Note (Signed)
 Date:  08/08/2024 Time:  8:50 PM  Group Topic/Focus:  Wrap-Up Group:   The focus of this group is to help patients review their daily goal of treatment and discuss progress on daily workbooks.    Participation Level:  Active  Participation Quality:  Appropriate  Affect:  Appropriate  Cognitive:  Appropriate  Insight: Appropriate  Engagement in Group:  Engaged  Modes of Intervention:  Education and Exploration  Additional Comments:  Patient attended and participated in group tonight. He reports that today he went outside and played basket ball. He listen to music and dance.  Gwenn Chillington Dacosta 08/08/2024, 8:50 PM

## 2024-08-08 NOTE — Progress Notes (Signed)
(  Sleep Hours) -8.25  (Any PRNs that were needed, meds refused, or side effects to meds)- n/a  (Any disturbances and when (visitation, over night)-none  (Concerns raised by the patient)- none, Pt CBG 58 this morning after protocol recheck 124  (SI/HI/AVH- denies

## 2024-08-08 NOTE — Group Note (Signed)
 Recreation Therapy Group Note   Group Topic:Emotion Expression  Group Date: 08/08/2024 Start Time: 1035 End Time: 1115 Facilitators: Sabeen Piechocki-McCall, LRT,CTRS Location: [address] Dayroom   Group Topic/Focus: Self Expression    Goal Area(s) Addresses:  Patient will be able to identify a variety of ways to express themselves.  Patient will successfully share why it is beneficial to express emotions.   Behavioral Response:    Intervention: Music   Activity: Music Therapy. Patients were given the opportunity to chose songs that they used to help express different feelings and emotions. As long as the songs were clean and appropriate, they were played during group.   Education: Communication, Discharge Planning   Educational Outcome: Acknowledges education   Affect/Mood: N/A   Participation Level: Did not attend    Clinical Observations/Individualized Feedback:     Plan: Continue to engage patient in RT group sessions 2-3x/week.   Kahdijah Errickson-McCall, LRT,CTRS 08/08/2024 3:29 PM

## 2024-08-08 NOTE — Plan of Care (Signed)
   Problem: Education: Goal: Emotional status will improve Outcome: Progressing Goal: Mental status will improve Outcome: Progressing   Problem: Activity: Goal: Interest or engagement in activities will improve Outcome: Progressing Goal: Sleeping patterns will improve Outcome: Progressing

## 2024-08-08 NOTE — BHH Counselor (Signed)
 CSW spoke with Jennine Endow - ACT team with [facility] who requested H&P and psychosocial assessment to aid in continuance of patient receiving ACT services. CSW emailed requested documents to [email or web address] per patient request.  Aayla Marrocco, LCSWA 08/08/24 1:06PM

## 2024-08-08 NOTE — Plan of Care (Signed)
  Problem: Coping: Goal: Ability to demonstrate self-control will improve Outcome: Progressing   Problem: Safety: Goal: Periods of time without injury will increase Outcome: Progressing   Problem: Activity: Goal: Interest or engagement in activities will improve Outcome: Not Progressing

## 2024-08-08 NOTE — Inpatient Diabetes Management (Signed)
 Inpatient Diabetes Program Recommendations  AACE/ADA: New Consensus Statement on Inpatient Glycemic Control (2015)  Target Ranges:  Prepandial:   less than 140 mg/dL      Peak postprandial:   less than 180 mg/dL (1-2 hours)      Critically ill patients:  140 - 180 mg/dL   Lab Results  Component Value Date   GLUCAP 76 08/08/2024    Review of Glycemic Control   Latest Reference Range & Units 08/06/24 17:07 08/06/24 19:37 08/07/24 06:18 08/07/24 12:04 08/07/24 17:16 08/07/24 20:00 08/08/24 05:48  Glucose-Capillary 70 - 99 mg/dL 737 (H) 853 (H) 79 601 (H) 300 (H) 311 (H) 76   Diabetes history: DM1(does not make insulin .  Needs correction, basal and meal coverage)   Outpatient Diabetes medications: Lantus  18 units BID, Novolog  6 units TID   Current orders for Inpatient glycemic control: Lantus  18 units BID, Novolog  6 units TID, Novolog  0-20 units TID   Inpatient Diabetes Program Recommendations:     Consider: - Changing correction to Novolog  0-9 units TID and 0-5 units at bedtime - Increasing meal coverage to Novolog  10 units TID (assuming patient is consuming >50% of meal)   Of note: Patient experiencing mild lows in AM, thus has been missing AM breakfast meal coverage.    Thanks, Tinnie Minus, MSN, RNC-OB Diabetes Coordinator [phone] (8a-5p)

## 2024-08-08 NOTE — Progress Notes (Signed)
 [facility] MD Progress Note  08/08/2024 1:06 PM Jacob [name]  MRN:  [id number] Subjective:   Jacob [name] is a 33 y.o. male (homeless, unemployed) with a medical history of T1DM and a psychiatric history of schizoaffective disorder bipolar-type admitted voluntarily to [facility] following medical admission for DKA.    Case was discussed in the multidisciplinary team. MAR was reviewed and patient was compliant with medications.  He received PRN Tylenol  yesterday.   Psychiatric Team made the following recommendations yesterday: -Decrease PO haldol  to 5 mg qhS  -Haldol  decanoate 200 mg IM given (10/6)   On interview today patient reports he slept good last night.  He reports his appetite is doing good.  He reports no SI, HI, or AVH.  He reports no Paranoia or Ideas of Reference.  He reports no issues with his medications.  He reports that he has been continuing to call shelters for placement but has not had success so far.  Discussed with him the importance of continuing to call and he reported understanding.  He reports no other concerns at present.   Principal Problem: Schizoaffective disorder, bipolar type (HCC) Diagnosis: Principal Problem:   Schizoaffective disorder, bipolar type (HCC) Active Problems:   Oth stimulant use, unsp w stim-induce psych disorder, unsp (HCC)  Total Time spent with patient:  I personally spent 35 minutes on the unit in direct patient care. The direct patient care time included face-to-face time with the patient, reviewing the patient's chart, communicating with other professionals, and coordinating care.    Past Psychiatric History:   numerous admissions over the last several years for psychotic symptoms including delusional content, hallucinations and agitated behavior [facility] [place], possibly [employer], [employer] and [place] ). Most recently admitted to [facility] 2025. Psychotic symptoms have been confounded by substance use  and poorly controled diabetes. He has had numerous admissions for DKA, some with noted encephalopathy. Previous diagnoses include schizoaffective disorder, Schizophrenia, bipolar disorder, stimulant use disorder (cocaine). It has been noted that mental illness appears to prevent management of diabetes to some degree and he has been following with an ACTT team (see number below). He has one prior suicide attempt in 2019. No self-harming behaviors. No history of trauma or anxiety symptoms. Usage of stimulants (cocaine) noted at various times with unclear pattern.   Past Medical History:  Past Medical History:  Diagnosis Date   Diabetes mellitus without complication (HCC)    History reviewed. No pertinent surgical history. Family History: History reviewed. No pertinent family history. Family Psychiatric  History:  None Reported  Social History:  Social History   Substance and Sexual Activity  Alcohol Use Not Currently     Social History   Substance and Sexual Activity  Drug Use Not Currently    Social History   Socioeconomic History   Marital status: Single    Spouse name: Not on file   Number of children: Not on file   Years of education: Not on file   Highest education level: Not on file  Occupational History   Not on file  Tobacco Use   Smoking status: Unknown   Smokeless tobacco: Not on file  Substance and Sexual Activity   Alcohol use: Not Currently   Drug use: Not Currently   Sexual activity: Not Currently  Other Topics Concern   Not on file  Social History Narrative   Not on file   Social Drivers of Health   Financial Resource Strain: Not on file  Food  Insecurity: Food Insecurity Present (08/03/2024)   Hunger Vital Sign    Worried About Running Out of Food in the Last Year: Sometimes true    Ran Out of Food in the Last Year: Sometimes true  Transportation Needs: Unmet Transportation Needs (08/03/2024)   PRAPARE - Administrator, Civil Service  (Medical): Yes    Lack of Transportation (Non-Medical): Patient declined  Physical Activity: Not on file  Stress: Not on file  Social Connections: Patient Declined (08/03/2024)   Social Connection and Isolation Panel    Frequency of Communication with Friends and Family: Patient declined    Frequency of Social Gatherings with Friends and Family: Patient declined    Attends Religious Services: Patient declined    Database administrator or Organizations: Patient declined    Attends Engineer, structural: Patient declined    Marital Status: Patient declined   Additional Social History:                         Sleep: Good Estimated Sleeping Duration (Last 24 Hours): 10.75-11.75 hours  Appetite:  Good  Current Medications: Current Facility-Administered Medications  Medication Dose Route Frequency Provider Last Rate Last Admin   acetaminophen  (TYLENOL ) tablet 650 mg  650 mg Oral Q6H PRN Wilkie Majel RAMAN, FNP   650 mg at 08/07/24 1242   alum & mag hydroxide-simeth (MAALOX/MYLANTA) 200-200-20 MG/5ML suspension 30 mL  30 mL Oral Q4H PRN Starkes-Perry, Majel RAMAN, FNP       dextromethorphan-guaiFENesin (MUCINEX DM) 30-600 MG per 12 hr tablet 1 tablet  1 tablet Oral BID PRN Starkes-Perry, Majel RAMAN, FNP       haloperidol  (HALDOL ) tablet 5 mg  5 mg Oral TID PRN Wilkie Majel RAMAN, FNP       And   diphenhydrAMINE  (BENADRYL ) capsule 50 mg  50 mg Oral TID PRN Starkes-Perry, Majel RAMAN, FNP       haloperidol  lactate (HALDOL ) injection 5 mg  5 mg Intramuscular TID PRN Starkes-Perry, Majel RAMAN, FNP       And   diphenhydrAMINE  (BENADRYL ) injection 50 mg  50 mg Intramuscular TID PRN Starkes-Perry, Majel RAMAN, FNP       And   LORazepam  (ATIVAN ) injection 2 mg  2 mg Intramuscular TID PRN Starkes-Perry, Majel RAMAN, FNP       haloperidol  lactate (HALDOL ) injection 10 mg  10 mg Intramuscular TID PRN Starkes-Perry, Majel RAMAN, FNP       And   diphenhydrAMINE  (BENADRYL ) injection 50 mg  50 mg  Intramuscular TID PRN Starkes-Perry, Majel RAMAN, FNP       And   LORazepam  (ATIVAN ) injection 2 mg  2 mg Intramuscular TID PRN Starkes-Perry, Majel RAMAN, FNP       docusate sodium  (COLACE) capsule 100 mg  100 mg Oral BID PRN Starkes-Perry, Takia S, FNP       feeding supplement (GLUCERNA SHAKE) (GLUCERNA SHAKE) liquid 237 mL  237 mL Oral TID BM Lenard Calin, MD   237 mL at 08/08/24 9082   haloperidol  (HALDOL ) tablet 5 mg  5 mg Oral QHS Towana Leita SAILOR, MD   5 mg at 08/07/24 2029   haloperidol  decanoate (HALDOL  DECANOATE) 100 MG/ML injection 200 mg  200 mg Intramuscular Q28 days Lenard Calin, MD   200 mg at 08/06/24 0842   insulin  aspart (novoLOG ) injection 0-20 Units  0-20 Units Subcutaneous TID WC Wilkie Majel RAMAN, FNP   20 Units at 08/08/24 1203  insulin  aspart (novoLOG ) injection 0-5 Units  0-5 Units Subcutaneous QHS Starkes-Perry, Takia S, FNP   4 Units at 08/07/24 2038   insulin  aspart (novoLOG ) injection 6 Units  6 Units Subcutaneous TID WC Butler, Laura N, MD   6 Units at 08/08/24 1205   insulin  glargine (LANTUS ) injection 18 Units  18 Units Subcutaneous BID Butler, Laura N, MD   18 Units at 08/08/24 0857   loratadine (CLARITIN) tablet 10 mg  10 mg Oral Daily PRN Towana Leita SAILOR, MD       nicotine  (NICODERM CQ  - dosed in mg/24 hours) patch 21 mg  21 mg Transdermal Daily Starkes-Perry, Takia S, FNP   21 mg at 08/08/24 9141   sodium chloride  (OCEAN) 0.65 % nasal spray 1 spray  1 spray Each Nare PRN Wilkie Majel RAMAN, FNP       traZODone  (DESYREL ) tablet 50 mg  50 mg Oral QHS Starkes-Perry, Takia S, FNP   50 mg at 08/07/24 2029    Lab Results:  Results for orders placed or performed during the hospital encounter of 08/03/24 (from the past 48 hours)  Glucose, capillary     Status: Abnormal   Collection Time: 08/06/24  5:07 PM  Result Value Ref Range   Glucose-Capillary 262 (H) 70 - 99 mg/dL    Comment: Glucose reference range applies only to samples taken after fasting for at  least 8 hours.  Glucose, capillary     Status: Abnormal   Collection Time: 08/06/24  7:37 PM  Result Value Ref Range   Glucose-Capillary 146 (H) 70 - 99 mg/dL    Comment: Glucose reference range applies only to samples taken after fasting for at least 8 hours.  Glucose, capillary     Status: None   Collection Time: 08/07/24  6:18 AM  Result Value Ref Range   Glucose-Capillary 79 70 - 99 mg/dL    Comment: Glucose reference range applies only to samples taken after fasting for at least 8 hours.  Glucose, capillary     Status: Abnormal   Collection Time: 08/07/24 12:04 PM  Result Value Ref Range   Glucose-Capillary 398 (H) 70 - 99 mg/dL    Comment: Glucose reference range applies only to samples taken after fasting for at least 8 hours.  Glucose, capillary     Status: Abnormal   Collection Time: 08/07/24  5:16 PM  Result Value Ref Range   Glucose-Capillary 300 (H) 70 - 99 mg/dL    Comment: Glucose reference range applies only to samples taken after fasting for at least 8 hours.  Glucose, capillary     Status: Abnormal   Collection Time: 08/07/24  8:00 PM  Result Value Ref Range   Glucose-Capillary 311 (H) 70 - 99 mg/dL    Comment: Glucose reference range applies only to samples taken after fasting for at least 8 hours.   Comment 1 Notify RN   Glucose, capillary     Status: None   Collection Time: 08/08/24  5:48 AM  Result Value Ref Range   Glucose-Capillary 76 70 - 99 mg/dL    Comment: Glucose reference range applies only to samples taken after fasting for at least 8 hours.   Comment 1 Notify RN   Glucose, capillary     Status: Abnormal   Collection Time: 08/08/24 12:00 PM  Result Value Ref Range   Glucose-Capillary 426 (H) 70 - 99 mg/dL    Comment: Glucose reference range applies only to samples taken after fasting for at  least 8 hours.    Blood Alcohol level:  Lab Results  Component Value Date   Total [facility] <15 07/29/2024    Metabolic Disorder Labs: No results found for:  HGBA1C, MPG No results found for: PROLACTIN No results found for: CHOL, TRIG, HDL, CHOLHDL, VLDL, LDLCALC  Physical Findings: AIMS:  ,  ,  ,  ,  ,  ,   CIWA:    COWS:     Musculoskeletal: Strength & Muscle Tone: within normal limits Gait & Station: normal Patient leans: N/A  Psychiatric Specialty Exam:  Presentation  General Appearance:  Appropriate for Environment; Casual  Eye Contact: Good  Speech: Clear and Coherent; Normal Rate  Speech Volume: Normal  Handedness: Right   Mood and Affect  Mood: Euthymic  Affect: Congruent; Appropriate   Thought Process  Thought Processes: Coherent; Goal Directed  Descriptions of Associations:Intact  Orientation:Full (Time, Place and Person)  Thought Content:Logical; WDL  History of Schizophrenia/Schizoaffective disorder:No  Duration of Psychotic Symptoms:Greater than six months  Hallucinations:Hallucinations: None  Ideas of Reference:None  Suicidal Thoughts:Suicidal Thoughts: No  Homicidal Thoughts:Homicidal Thoughts: No   Sensorium  Memory: Immediate Fair; Recent Fair  Judgment: Fair  Insight: Fair   Art therapist  Concentration: Fair  Attention Span: Fair  Recall: [employer] of Knowledge: Fair  Language: Fair   Psychomotor Activity  Psychomotor Activity: Psychomotor Activity: Normal   Assets  Assets: Communication Skills; Resilience; Social Support   Sleep  Sleep: Sleep: Good    Physical Exam: Physical Exam Vitals and nursing note reviewed.  Constitutional:      General: He is not in acute distress.    Appearance: Normal appearance. He is normal weight. He is not ill-appearing or toxic-appearing.  HENT:     Head: Normocephalic and atraumatic.  Pulmonary:     Effort: Pulmonary effort is normal.  Musculoskeletal:        General: Normal range of motion.  Neurological:     General: No focal deficit present.     Mental Status: He is alert.     Review of Systems  Respiratory:  Negative for cough and shortness of breath.   Cardiovascular:  Negative for chest pain.  Gastrointestinal:  Negative for abdominal pain, constipation, diarrhea, nausea and vomiting.  Neurological:  Negative for dizziness, weakness and headaches.  Psychiatric/Behavioral:  Negative for depression, hallucinations and suicidal ideas. The patient is not nervous/anxious.    Blood pressure 129/85, pulse 89, temperature 98.4 F (36.9 C), temperature source Oral, resp. rate 16, height 5' 6 (1.676 m), weight 57.6 kg, SpO2 99%. Body mass index is 20.5 kg/m.   Treatment Plan Summary: Daily contact with patient to assess and evaluate symptoms and progress in treatment and Medication management  Azell Detwiler is a 33 y.o. male (homeless, unemployed) with a medical history of T1DM and a psychiatric history of schizoaffective disorder bipolar-type admitted voluntarily to [facility] following medical admission for DKA.    Jaydin is still doing well.  His ACT Team is working on securing housing and he has been continuing to call shelters.  We continue to encourage him to places to secure a bed.  Appreciate [employer] assistance with this.  We will not make any changes to his medications at this time.  We will continue to monitor.    DSM-5 diagnoses: Schizoaffective disorder, bipolar type  Stimulant use disorder, unspecified      Plan:   Legal Status: -voluntary   Safety -q15 minute checks  -elopement, suicide and assault precautions  -  daily vitals   Psychiatric Concerns  -Continue PO haldol  to 5 mg qhS             -plan to continue for 10-15 days after next injection    -Haldol  decanoate 200 mg IM given (10/6) - continue this dosage outpatient q28 days   Substance use concerns  -Cocaine - unspecified pattern of usage at this time   Nicotine  Replacement  Patch and gum available    Medical concerns -T1DM with recent admission for DKA:             -Insulin  Aspart 6 mg TID with meals            -Insuline Glargine 18 U BID             -High-sensitivity sliding scale 0-20 U            -will continue to monitor closely   Additional PRNs: -Tylenol  tablets 650 mg every 6 hours as needed for pain -Maalox/Mylanta suspension 30 mL every 4 hours as needed for indigestion  -Milk of Magnesia 30 mL daily as needed for constipation   Labs -Reviewed as documented in HPI   Psychosocial interventions  -Motivational interviewing  -daily medication management with psychiatry -Medication education regarding risks/benefits and alternatives -bedside psychotherapy as indicated  -Patient will be encouraged to participate and engage with group therapy  -Appreciate SW assistance in coordinating safe disposition   Marsa GORMAN Rosser, DO 08/08/2024, 1:06 PM

## 2024-08-09 ENCOUNTER — Ambulatory Visit: Payer: MEDICAID | Admitting: Student

## 2024-08-09 LAB — GLUCOSE, CAPILLARY
Glucose-Capillary: 124 mg/dL — ABNORMAL HIGH (ref 70–99)
Glucose-Capillary: 58 mg/dL — ABNORMAL LOW (ref 70–99)
Glucose-Capillary: 274 mg/dL — ABNORMAL HIGH (ref 70–99)
Glucose-Capillary: 362 mg/dL — ABNORMAL HIGH (ref 70–99)
Glucose-Capillary: 291 mg/dL — ABNORMAL HIGH (ref 70–99)

## 2024-08-09 LAB — VOLATILES,BLD-ACETONE,ETHANOL,ISOPROP,METHANOL
Acetone, blood: 0.053 g/dL — ABNORMAL HIGH (ref 0.000–0.010)
Ethanol, blood: 0 g/dL (ref 0.000–0.010)
Isopropanol, blood: 0.001 g/dL (ref 0.000–0.010)
Methanol, blood: 0 g/dL (ref 0.000–0.010)

## 2024-08-09 LAB — ETHYLENE GLYCOL: Ethylene Glycol Lvl: <5 mg/dL

## 2024-08-09 NOTE — Progress Notes (Signed)
  Hypoglycemic Event  CBG: 58  Treatment: 8 oz juice/soda  Symptoms: None  Follow-up CBG: Time:617 CBG Result:124  Possible Reasons for Event: Unknown  Comments/MD notified:continue to monitor    Orlando Ozell LABOR

## 2024-08-09 NOTE — Progress Notes (Signed)
 [facility] MD Progress Note  08/09/2024 2:48 PM Jacob [name]  MRN:  [id number] Subjective:   Jacob [name] is a 33 y.o. male (homeless, unemployed) with a medical history of T1DM and a psychiatric history of schizoaffective disorder bipolar-type admitted voluntarily to [facility] following medical admission for DKA.    Case was discussed in the multidisciplinary team. MAR was reviewed and patient was compliant with medications.  He received PRN Tylenol  yesterday.   Psychiatric Team made the following recommendations yesterday: -Decrease PO haldol  to 5 mg qhS  -Haldol  decanoate 200 mg IM given (10/6)   On interview today patient reports he slept good last night.  He reports his appetite is doing good.  He reports no SI, HI, or AVH.  He reports no Paranoia or Ideas of Reference.  He reports no issues with his medications.  He reports that he has talked with his mother and she can pick him up and he can stay with his brother.  Discussed with him that we would confirm this but encouraged him to still call shelters as we were looking at discharge tomorrow.  He reported understanding and reports no other concerns at present.   Principal Problem: Schizoaffective disorder, bipolar type (HCC) Diagnosis: Principal Problem:   Schizoaffective disorder, bipolar type (HCC) Active Problems:   Oth stimulant use, unsp w stim-induce psych disorder, unsp (HCC)  Total Time spent with patient:  I personally spent 35 minutes on the unit in direct patient care. The direct patient care time included face-to-face time with the patient, reviewing the patient's chart, communicating with other professionals, and coordinating care.    Past Psychiatric History:   numerous admissions over the last several years for psychotic symptoms including delusional content, hallucinations and agitated behavior [facility], [facility] [place], possibly [employer], [employer] and [facility] ). Most recently admitted to [facility]  February 2025. Psychotic symptoms have been confounded by substance use and poorly controled diabetes. He has had numerous admissions for DKA, some with noted encephalopathy. Previous diagnoses include schizoaffective disorder, Schizophrenia, bipolar disorder, stimulant use disorder (cocaine). It has been noted that mental illness appears to prevent management of diabetes to some degree and he has been following with an ACTT team (see number below). He has one prior suicide attempt in 2019. No self-harming behaviors. No history of trauma or anxiety symptoms. Usage of stimulants (cocaine) noted at various times with unclear pattern.   Past Medical History:  Past Medical History:  Diagnosis Date   Diabetes mellitus without complication (HCC)    History reviewed. No pertinent surgical history. Family History: History reviewed. No pertinent family history. Family Psychiatric  History:  None Reported  Social History:  Social History   Substance and Sexual Activity  Alcohol Use Not Currently     Social History   Substance and Sexual Activity  Drug Use Not Currently    Social History   Socioeconomic History   Marital status: Single    Spouse name: Not on file   Number of children: Not on file   Years of education: Not on file   Highest education level: Not on file  Occupational History   Not on file  Tobacco Use   Smoking status: Unknown   Smokeless tobacco: Not on file  Substance and Sexual Activity   Alcohol use: Not Currently   Drug use: Not Currently   Sexual activity: Not Currently  Other Topics Concern   Not on file  Social History Narrative   Not on file  Social Drivers of Corporate investment banker Strain: Not on file  Food Insecurity: Food Insecurity Present (08/03/2024)   Hunger Vital Sign    Worried About Running Out of Food in the Last Year: Sometimes true    Ran Out of Food in the Last Year: Sometimes true  Transportation Needs: Unmet Transportation Needs  (08/03/2024)   PRAPARE - Administrator, Civil Service (Medical): Yes    Lack of Transportation (Non-Medical): Patient declined  Physical Activity: Not on file  Stress: Not on file  Social Connections: Patient Declined (08/03/2024)   Social Connection and Isolation Panel    Frequency of Communication with Friends and Family: Patient declined    Frequency of Social Gatherings with Friends and Family: Patient declined    Attends Religious Services: Patient declined    Database administrator or Organizations: Patient declined    Attends Engineer, structural: Patient declined    Marital Status: Patient declined   Additional Social History:                         Sleep: Good Estimated Sleeping Duration (Last 24 Hours): 8.00-8.25 hours  Appetite:  Good  Current Medications: Current Facility-Administered Medications  Medication Dose Route Frequency Provider Last Rate Last Admin   acetaminophen  (TYLENOL ) tablet 650 mg  650 mg Oral Q6H PRN Wilkie Majel RAMAN, FNP   650 mg at 08/07/24 1242   alum & mag hydroxide-simeth (MAALOX/MYLANTA) 200-200-20 MG/5ML suspension 30 mL  30 mL Oral Q4H PRN Starkes-Perry, Majel RAMAN, FNP       dextromethorphan-guaiFENesin (MUCINEX DM) 30-600 MG per 12 hr tablet 1 tablet  1 tablet Oral BID PRN Starkes-Perry, Takia S, FNP       haloperidol  (HALDOL ) tablet 5 mg  5 mg Oral TID PRN Wilkie Majel RAMAN, FNP       And   diphenhydrAMINE  (BENADRYL ) capsule 50 mg  50 mg Oral TID PRN Starkes-Perry, Majel RAMAN, FNP       haloperidol  lactate (HALDOL ) injection 5 mg  5 mg Intramuscular TID PRN Starkes-Perry, Majel RAMAN, FNP       And   diphenhydrAMINE  (BENADRYL ) injection 50 mg  50 mg Intramuscular TID PRN Starkes-Perry, Majel RAMAN, FNP       And   LORazepam  (ATIVAN ) injection 2 mg  2 mg Intramuscular TID PRN Starkes-Perry, Majel RAMAN, FNP       haloperidol  lactate (HALDOL ) injection 10 mg  10 mg Intramuscular TID PRN Starkes-Perry, Majel RAMAN, FNP        And   diphenhydrAMINE  (BENADRYL ) injection 50 mg  50 mg Intramuscular TID PRN Starkes-Perry, Majel RAMAN, FNP       And   LORazepam  (ATIVAN ) injection 2 mg  2 mg Intramuscular TID PRN Starkes-Perry, Majel RAMAN, FNP       docusate sodium  (COLACE) capsule 100 mg  100 mg Oral BID PRN Wilkie Majel RAMAN, FNP       haloperidol  (HALDOL ) tablet 5 mg  5 mg Oral QHS Towana Leita SAILOR, MD   5 mg at 08/08/24 2034   haloperidol  decanoate (HALDOL  DECANOATE) 100 MG/ML injection 200 mg  200 mg Intramuscular Q28 days Lenard Calin, MD   200 mg at 08/06/24 9157   insulin  aspart (novoLOG ) injection 0-20 Units  0-20 Units Subcutaneous TID WC Wilkie Majel RAMAN, FNP   11 Units at 08/09/24 1232   insulin  aspart (novoLOG ) injection 0-5 Units  0-5 Units Subcutaneous QHS Starkes-Perry,  Majel RAMAN, FNP   4 Units at 08/07/24 2038   insulin  aspart (novoLOG ) injection 6 Units  6 Units Subcutaneous TID WC Butler, Laura N, MD   6 Units at 08/09/24 1233   insulin  glargine (LANTUS ) injection 18 Units  18 Units Subcutaneous BID Butler, Laura N, MD   18 Units at 08/09/24 9092   loratadine (CLARITIN) tablet 10 mg  10 mg Oral Daily PRN Towana Leita SAILOR, MD       nicotine  (NICODERM CQ  - dosed in mg/24 hours) patch 21 mg  21 mg Transdermal Daily Starkes-Perry, Takia S, FNP   21 mg at 08/08/24 9141   sodium chloride  (OCEAN) 0.65 % nasal spray 1 spray  1 spray Each Nare PRN Wilkie Majel RAMAN, FNP       traZODone  (DESYREL ) tablet 50 mg  50 mg Oral QHS Starkes-Perry, Takia S, FNP   50 mg at 08/08/24 2034    Lab Results:  Results for orders placed or performed during the hospital encounter of 08/03/24 (from the past 48 hours)  Glucose, capillary     Status: Abnormal   Collection Time: 08/07/24  5:16 PM  Result Value Ref Range   Glucose-Capillary 300 (H) 70 - 99 mg/dL    Comment: Glucose reference range applies only to samples taken after fasting for at least 8 hours.  Glucose, capillary     Status: Abnormal   Collection Time:  08/07/24  8:00 PM  Result Value Ref Range   Glucose-Capillary 311 (H) 70 - 99 mg/dL    Comment: Glucose reference range applies only to samples taken after fasting for at least 8 hours.   Comment 1 Notify RN   Glucose, capillary     Status: None   Collection Time: 08/08/24  5:48 AM  Result Value Ref Range   Glucose-Capillary 76 70 - 99 mg/dL    Comment: Glucose reference range applies only to samples taken after fasting for at least 8 hours.   Comment 1 Notify RN   Glucose, capillary     Status: Abnormal   Collection Time: 08/08/24 12:00 PM  Result Value Ref Range   Glucose-Capillary 426 (H) 70 - 99 mg/dL    Comment: Glucose reference range applies only to samples taken after fasting for at least 8 hours.  Glucose, capillary     Status: Abnormal   Collection Time: 08/08/24  4:49 PM  Result Value Ref Range   Glucose-Capillary 397 (H) 70 - 99 mg/dL    Comment: Glucose reference range applies only to samples taken after fasting for at least 8 hours.  Glucose, capillary     Status: Abnormal   Collection Time: 08/08/24  7:46 PM  Result Value Ref Range   Glucose-Capillary 186 (H) 70 - 99 mg/dL    Comment: Glucose reference range applies only to samples taken after fasting for at least 8 hours.   Comment 1 Notify RN   Glucose, capillary     Status: Abnormal   Collection Time: 08/09/24  6:02 AM  Result Value Ref Range   Glucose-Capillary 58 (L) 70 - 99 mg/dL    Comment: Glucose reference range applies only to samples taken after fasting for at least 8 hours.  Glucose, capillary     Status: Abnormal   Collection Time: 08/09/24  6:17 AM  Result Value Ref Range   Glucose-Capillary 124 (H) 70 - 99 mg/dL    Comment: Glucose reference range applies only to samples taken after fasting for at least 8 hours.  Glucose, capillary     Status: Abnormal   Collection Time: 08/09/24 11:48 AM  Result Value Ref Range   Glucose-Capillary 274 (H) 70 - 99 mg/dL    Comment: Glucose reference range applies  only to samples taken after fasting for at least 8 hours.    Blood Alcohol level:  Lab Results  Component Value Date   [facility] <15 07/29/2024    Metabolic Disorder Labs: No results found for: HGBA1C, MPG No results found for: PROLACTIN No results found for: CHOL, TRIG, HDL, CHOLHDL, VLDL, LDLCALC  Physical Findings: AIMS:  ,  ,  ,  ,  ,  ,   CIWA:    COWS:     Musculoskeletal: Strength & Muscle Tone: within normal limits Gait & Station: normal Patient leans: N/A  Psychiatric Specialty Exam:  Presentation  General Appearance:  Appropriate for Environment; Casual  Eye Contact: Good  Speech: Clear and Coherent; Normal Rate  Speech Volume: Normal  Handedness: Right   Mood and Affect  Mood: Euthymic  Affect: Congruent; Appropriate   Thought Process  Thought Processes: Coherent; Goal Directed  Descriptions of Associations:Intact  Orientation:Full (Time, Place and Person)  Thought Content:Logical; WDL  History of Schizophrenia/Schizoaffective disorder:No  Duration of Psychotic Symptoms:Greater than six months  Hallucinations:Hallucinations: None  Ideas of Reference:None  Suicidal Thoughts:Suicidal Thoughts: No  Homicidal Thoughts:Homicidal Thoughts: No   Sensorium  Memory: Immediate Fair; Recent Fair  Judgment: Fair  Insight: Fair   Art therapist  Concentration: Fair  Attention Span: Fair  Recall: [employer] of Knowledge: Fair  Language: Fair   Psychomotor Activity  Psychomotor Activity: Psychomotor Activity: Normal   Assets  Assets: Communication Skills; Resilience; Social Support   Sleep  Sleep: Sleep: Good    Physical Exam: Physical Exam Vitals and nursing note reviewed.  Constitutional:      General: He is not in acute distress.    Appearance: Normal appearance. He is normal weight. He is not ill-appearing or toxic-appearing.  HENT:     Head: Normocephalic and atraumatic.   Pulmonary:     Effort: Pulmonary effort is normal.  Musculoskeletal:        General: Normal range of motion.  Neurological:     General: No focal deficit present.     Mental Status: He is alert.    Review of Systems  Respiratory:  Negative for cough and shortness of breath.   Cardiovascular:  Negative for chest pain.  Gastrointestinal:  Negative for abdominal pain, constipation, diarrhea, nausea and vomiting.  Neurological:  Negative for dizziness, weakness and headaches.  Psychiatric/Behavioral:  Negative for depression, hallucinations and suicidal ideas. The patient is not nervous/anxious.    Blood pressure 135/85, pulse 86, temperature (!) 97.5 F (36.4 C), temperature source Oral, resp. rate 16, height 5' 6 (1.676 m), weight 57.6 kg, SpO2 99%. Body mass index is 20.5 kg/m.   Treatment Plan Summary: Daily contact with patient to assess and evaluate symptoms and progress in treatment and Medication management  Jacob [name] is a 33 y.o. male (homeless, unemployed) with a medical history of T1DM and a psychiatric history of schizoaffective disorder bipolar-type admitted voluntarily to [facility] following medical admission for DKA.    Kyndal has done well with his medications.  We will contact his mother and if he is able to return home with her then we will plan for that tomorrow if not we will have to consider IRC.  We will not make any changes to his medications att his time.  We will continue to monitor.   DSM-5 diagnoses: Schizoaffective disorder, bipolar type  Stimulant use disorder, unspecified      Plan:   Legal Status: -voluntary   Safety -q15 minute checks  -elopement, suicide and assault precautions  -daily vitals   Psychiatric Concerns  -Continue PO haldol  to 5 mg qhS             -plan to continue for 10-15 days after next injection    -Haldol  decanoate 200 mg IM given (10/6) - continue this dosage outpatient q28 days   Substance use concerns  -Cocaine -  unspecified pattern of usage at this time   Nicotine  Replacement  Patch and gum available    Medical concerns -T1DM with recent admission for DKA:            -Insulin  Aspart 6 mg TID with meals            -Insuline Glargine 18 U BID             -High-sensitivity sliding scale 0-20 U            -will continue to monitor closely   Additional PRNs: -Tylenol  tablets 650 mg every 6 hours as needed for pain -Maalox/Mylanta suspension 30 mL every 4 hours as needed for indigestion  -Milk of Magnesia 30 mL daily as needed for constipation   Labs -Reviewed as documented in HPI   Psychosocial interventions  -Motivational interviewing  -daily medication management with psychiatry -Medication education regarding risks/benefits and alternatives -bedside psychotherapy as indicated  -Patient will be encouraged to participate and engage with group therapy  -Appreciate SW assistance in coordinating safe disposition   Marsa GORMAN Rosser, DO 08/09/2024, 2:48 PM

## 2024-08-09 NOTE — Group Note (Signed)
 Occupational Therapy Group Note  Group Topic: Sleep Hygiene  Group Date: 08/09/2024 Start Time: 1530 End Time: 1600 Facilitators: Dot [place] MATSU, OT   Group Description: Group encouraged increased participation and engagement through topic focused on sleep hygiene. Patients reflected on the quality of sleep they typically receive and identified areas that need improvement. Group was given background information on sleep and sleep hygiene, including common sleep disorders. Group members also received information on how to improve one's sleep and introduced a sleep diary as a tool that can be utilized to track sleep quality over a length of time. Group session ended with patients identifying one or more strategies they could utilize or implement into their sleep routine in order to improve overall sleep quality.        Therapeutic Goal(s):  Identify one or more strategies to improve overall sleep hygiene  Identify one or more areas of sleep that are negatively impacted (sleep too much, too little, etc)     Participation Level: Engaged   Participation Quality: Independent   Behavior: Appropriate   Speech/Thought Process: Relevant   Affect/Mood: Appropriate   Insight: Fair   Judgement: Fair      Modes of Intervention: Education  Patient Response to Interventions:  Attentive   Plan: Continue to engage patient in OT groups 2 - 3x/week.  08/09/2024  [place] MATSU Dot, OT  Lilliauna Van, OT

## 2024-08-09 NOTE — [facility] Suicide Risk Assessment (Signed)
 [facility] INPATIENT:  Family/Significant Other Suicide Prevention Education  Suicide Prevention Education:  Education Completed; [name] [facility]) [phone],  (name of family member/significant other) has been identified by the patient as the family member/significant other with whom the patient will be residing, and identified as the person(s) who will aid the patient in the event of a mental health crisis (suicidal ideations/suicide attempt).  With written consent from the patient, the family member/significant other has been provided the following suicide prevention education, prior to the and/or following the discharge of the patient.  [name] (mother) confirmed patient will not have access to firearms/guns/weapons after discharge. [name] reported she'd be willing to provide assistance and support should patient have a mental health crisis. CSW educated mother on services offered such as [facility] and 988 crisis line. [name] reported she'd be willing to assist patient with safely storing and securing medications. [name] denied any safety concerns related to patient discharging. She reported she'd be picking patient up on 10/10 at 10:00am for discharge. Providers made aware.   The suicide prevention education provided includes the following: Suicide risk factors Suicide prevention and interventions National Suicide Hotline telephone number [facility] assessment telephone number [facility] Emergency Assistance 911 [facility] and/or Residential Mobile Crisis Unit telephone number  Request made of family/significant other to: Remove weapons (e.g., guns, rifles, knives), all items previously/currently identified as safety concern.   Remove drugs/medications (over-the-counter, prescriptions, illicit drugs), all items previously/currently identified as a safety concern.  The family member/significant other verbalizes understanding of the suicide prevention education information provided.   The family member/significant other agrees to remove the items of safety concern listed above.  Godwin Tedesco M [place], LCSWA 08/09/2024, 3:18 PM

## 2024-08-09 NOTE — Group Note (Signed)
 Date:  08/09/2024 Time:  8:54 PM  Group Topic/Focus:  Wrap-Up Group:   The focus of this group is to help patients review their daily goal of treatment and discuss progress on daily workbooks.    Participation Level:  Active  Participation Quality:  Appropriate  Affect:  Appropriate  Cognitive:  Appropriate  Insight: Appropriate  Engagement in Group:  Engaged  Modes of Intervention:  Education and Exploration  Additional Comments:  Patient attended and participated in group tonight. He reports that today his goal today was to relax.  He is schedule to go home tomorrow. He took a shower, attended groupk and speak with his doctor.  Gwenn Chillington Dacosta 08/09/2024, 8:54 PM

## 2024-08-09 NOTE — Plan of Care (Signed)
   Problem: Education: Goal: Knowledge of Jacob [name] General Education information/materials will improve Outcome: Progressing Goal: Emotional status will improve Outcome: Progressing Goal: Mental status will improve Outcome: Progressing Goal: Verbalization of understanding the information provided will improve Outcome: Progressing   Problem: Activity: Goal: Interest or engagement in activities will improve Outcome: Progressing Goal: Sleeping patterns will improve Outcome: Progressing   Problem: Coping: Goal: Ability to verbalize frustrations and anger appropriately will improve Outcome: Progressing Goal: Ability to demonstrate self-control will improve Outcome: Progressing

## 2024-08-09 NOTE — Progress Notes (Signed)
 Patient given orange juice with crackers in it, will recheck in 15 mins.

## 2024-08-09 NOTE — Group Note (Signed)
 Recreation Therapy Group Note   Group Topic:Problem Solving  Group Date: 08/09/2024 Start Time: 1002 End Time: 1028 Facilitators: Edge Mauger-McCall, LRT,CTRS Location: [address] Dayroom   Group Topic: Problem Solving   Goal Area(s) Addresses:  Patient will successfully try to guess the pictures being drawn.  Patient will acknowledge benefits healthy engagement with others post discharge.  Patient will actively work towards goal.   Behavioral Response: Minimal   Intervention: Competitive Group Game    Activity: Pictionary. In groups of 5-7, patients took turns trying to guess the picture being drawn on the board by their teammate.  If the team guessed the correct answer, they won a point.  If the team guessed wrong, the other team got a chance to steal the point. After several rounds of game play, the team with the most points were declared winners. Post-activity discussion reviewed benefits of positive recreation outlets: reducing stress, improving coping mechanisms, increasing self-esteem, and building larger support systems.   Education:  Teacher, English as a foreign language, Leisure as Merchant navy officer, Programmer, applications, Building control surveyor   Education Outcome: Acknowledges education/In group clarification offered/Needs additional education   Affect/Mood: Appropriate   Participation Level: Minimal   Participation Quality: Independent   Behavior: Cooperative   Speech/Thought Process: Relevant   Insight: Fair   Judgement: Fair    Modes of Intervention: Competitive Play   Patient Response to Interventions:  Receptive   Education Outcome:  In group clarification offered    Clinical Observations/Individualized Feedback: Pt came into group a few times. While in group, pt made some guesses and drew one picture. Pt then left and didn't return.    Plan: Continue to engage patient in RT group sessions 2-3x/week.   Astaria Nanez-McCall, LRT,CTRS 08/09/2024 11:58 AM

## 2024-08-09 NOTE — Plan of Care (Signed)
   Problem: Education: Goal: Emotional status will improve Outcome: Progressing Goal: Mental status will improve Outcome: Progressing Goal: Verbalization of understanding the information provided will improve Outcome: Progressing

## 2024-08-09 NOTE — Inpatient Diabetes Management (Signed)
 Inpatient Diabetes Program Recommendations  AACE/ADA: New Consensus Statement on Inpatient Glycemic Control (2015)  Target Ranges:  Prepandial:   less than 140 mg/dL      Peak postprandial:   less than 180 mg/dL (1-2 hours)      Critically ill patients:  140 - 180 mg/dL   Lab Results  Component Value Date   GLUCAP 274 (H) 08/09/2024    Review of Glycemic Control   Latest Reference Range & Units 08/08/24 05:48 08/08/24 12:00 08/08/24 16:49 08/08/24 19:46 08/09/24 06:02 08/09/24 06:17 08/09/24 11:48  Glucose-Capillary 70 - 99 mg/dL 76 573 (H) 602 (H) 813 (H) 58 (L) 124 (H) 274 (H)   Diabetes history: DM1(does not make insulin .  Needs correction, basal and meal coverage)   Outpatient Diabetes medications: Lantus  18 units BID, Novolog  6 units TID   Current orders for Inpatient glycemic control: Lantus  18 units BID, Novolog  6 units TID, Novolog  0-20 units TID   Inpatient Diabetes Program Recommendations:     Consider: - Changing correction to Novolog  0-9 units TID and 0-5 units at bedtime - Increasing meal coverage to Novolog  10 units TID (assuming patient is consuming >50% of meal)   Of note: Patient experiencing mild lows in AM, thus has been missing AM breakfast meal coverage.    Thanks, Clotilda Bull RN, MSN, BC-ADM Inpatient Diabetes Coordinator Team Pager [phone] (8a-5p)

## 2024-08-09 NOTE — Progress Notes (Signed)
 Tour of Duty:  Ronnald CHRISTELLA Boyer, RN, 08/09/24, Tour of Duty: 0700-1900  SI/HI/AVH: Denies  Self-Reported   Mood: Positive  Anxiety: Denies Depression: Denies Irritability: Denies  Broset  Violence Prevention Guidelines *See Row Information*: Small Violence Risk interventions implemented   LBM  Last BM Date : (not recorded)   Pain: not present  Patient Refusals (including Rx): No  >>Shift Summary:   Last Vitals  Vitals Weight: 57.6 kg Temp: (!) 97.5 F (36.4 C) Temp Source: Oral Pulse Rate: 86 Resp: 16 BP: 135/85 Patient Position: (not recorded)  Admission Type  Psych Admission Type (Psych Patients Only) Admission Status: Voluntary Date 72 hour document signed : (not recorded) Time 72 hour document signed : (not recorded) Provider Notified (First and Last Name) (see details for LINK to note): (not recorded)   Psychosocial Assessment  Psychosocial Assessment Patient Complaints: None Eye Contact: Fair Facial Expression: Anxious, Animated Affect: Appropriate to circumstance Speech: Soft Interaction: Assertive Motor Activity: Fidgety Appearance/Hygiene: Disheveled Behavior Characteristics: Cooperative Mood: Pleasant   Aggressive Behavior  Targets: (not recorded)   Thought Process  Thought Process Coherency: Within Defined Limits Content: Within Defined Limits Delusions: None reported or observed Perception: Within Defined Limits Hallucination: None reported or observed Judgment: Impaired Confusion: Within Defined Limits  Danger to Self/Others  Danger to Self Current suicidal ideation?: Denies Description of Suicide Plan: (not recorded) Self-Injurious Behavior: (not recorded) Agreement Not to Harm Self: (not recorded) Description of Agreement: (not recorded) Danger to Others: None reported or observed

## 2024-08-10 ENCOUNTER — Encounter (HOSPITAL_COMMUNITY): Payer: Self-pay

## 2024-08-10 DIAGNOSIS — F19959 Other psychoactive substance use, unspecified with psychoactive substance-induced psychotic disorder, unspecified: Secondary | ICD-10-CM

## 2024-08-10 LAB — GLUCOSE, CAPILLARY: Glucose-Capillary: 80 mg/dL (ref 70–99)

## 2024-08-10 MED ORDER — TRAZODONE HCL 50 MG PO TABS: 50.0000 mg | ORAL_TABLET | Freq: Every day | ORAL | 0 refills | Status: DC

## 2024-08-10 MED ORDER — HALOPERIDOL DECANOATE 100 MG/ML IM SOLN: 200.0000 mg | INTRAMUSCULAR | 0 refills | Status: DC

## 2024-08-10 MED ORDER — NICOTINE 21 MG/24HR TD PT24: 21.0000 mg | MEDICATED_PATCH | Freq: Every day | TRANSDERMAL | 0 refills | Status: DC

## 2024-08-10 MED ORDER — HALOPERIDOL 5 MG PO TABS: 5.0000 mg | ORAL_TABLET | Freq: Every day | ORAL | 0 refills | Status: DC

## 2024-08-10 NOTE — Progress Notes (Signed)
 Pt awake and alert x 4.  Denies SI, HI or AVH.  Pt given AVS along with prescriptions and discharge paperwork.  Verbalized understanding of follow up instructions.  Pt was given all belongings from locker 44 and escorted to lobby.  Pt left with his mother without incident.

## 2024-08-10 NOTE — [facility] Suicide Risk Assessment (Addendum)
 [facility] Discharge Suicide Risk Assessment   Principal Problem: Schizoaffective disorder, bipolar type [facility]) Discharge Diagnoses: Principal Problem:   Schizoaffective disorder, bipolar type (HCC) Active Problems:   Oth stimulant use, unsp w stim-induce psych disorder, unsp (HCC)  During the patient's hospitalization, patient had extensive initial psychiatric evaluation, and follow-up psychiatric evaluations every day.  Psychiatric diagnoses provided upon initial assessment: Schizoaffective Disorder, Bipolar Type Oth stimulant use, unsp w stim-induce psych disorder, unsp (HCC)  Patient's psychiatric medications were adjusted on admission: He was continued on oral Haldol .  During the hospitalization, other adjustments were made to the patient's psychiatric medication regimen: He was given his Haldol  DEC LAI 200 mg on 10/6 (next due on 11/3) and his oral dose of Haldol  was decreased.   Gradually, patient started adjusting to milieu.   Patient's care was discussed during the interdisciplinary team meeting every day during the hospitalization.  The patient is not having side effects to prescribed psychiatric medication.  The patient reports their target psychiatric symptoms of disorganization responded well to the psychiatric medications, and the patient reports overall benefit other psychiatric hospitalization. Supportive psychotherapy was provided to the patient. The patient also participated in regular group therapy while admitted.   Labs were reviewed with the patient, and abnormal results were discussed with the patient.  The patient denied having suicidal thoughts more than 48 hours prior to discharge.  Patient denies having homicidal thoughts.  Patient denies having auditory hallucinations.  Patient denies any visual hallucinations.  Patient denies having paranoid thoughts.  The patient is able to verbalize their individual safety plan to this provider.  It is recommended to the patient  to continue psychiatric medications as prescribed, after discharge from the hospital.    It is recommended to the patient to follow up with your outpatient psychiatric provider and PCP.  Discussed with the patient, the impact of alcohol, drugs, tobacco have been there overall psychiatric and medical wellbeing, and total abstinence from substance use was recommended the patient.  Total Time spent with patient: 20 minutes  Musculoskeletal: Strength & Muscle Tone: within normal limits Gait & Station: normal Patient leans: N/A  Psychiatric Specialty Exam  Presentation  General Appearance:  Appropriate for Environment; Casual  Eye Contact: Good  Speech: Clear and Coherent; Normal Rate  Speech Volume: Normal  Handedness: Right   Mood and Affect  Mood: Euthymic  Duration of Depression Symptoms: No data recorded Affect: Appropriate; Congruent   Thought Process  Thought Processes: Coherent; Goal Directed  Descriptions of Associations:Intact  Orientation:Full (Time, Place and Person)  Thought Content:Logical; WDL  History of Schizophrenia/Schizoaffective disorder:No  Duration of Psychotic Symptoms:Greater than six months  Hallucinations:Hallucinations: None  Ideas of Reference:None  Suicidal Thoughts:Suicidal Thoughts: No  Homicidal Thoughts:Homicidal Thoughts: No   Sensorium  Memory: Immediate Fair; Recent Fair  Judgment: Fair  Insight: Fair   Art therapist  Concentration: Fair  Attention Span: Fair  Recall: [employer] of Knowledge: Fair  Language: Fair   Psychomotor Activity  Psychomotor Activity: Psychomotor Activity: Normal   Assets  Assets: Communication Skills; Resilience; Social Support   Sleep  Sleep: Sleep: Good  Estimated Sleeping Duration (Last 24 Hours): 8.75-9.75 hours  Physical Exam: Physical Exam Vitals and nursing note reviewed.  Constitutional:      General: He is not in acute distress.     Appearance: Normal appearance. He is normal weight. He is not ill-appearing or toxic-appearing.  HENT:     Head: Normocephalic and atraumatic.  Pulmonary:  Effort: Pulmonary effort is normal.  Musculoskeletal:        General: Normal range of motion.  Neurological:     General: No focal deficit present.     Mental Status: He is alert.    Review of Systems  Respiratory:  Negative for cough and shortness of breath.   Cardiovascular:  Negative for chest pain.  Gastrointestinal:  Negative for abdominal pain, constipation, diarrhea, nausea and vomiting.  Neurological:  Negative for dizziness, weakness and headaches.  Psychiatric/Behavioral:  Negative for depression, hallucinations and suicidal ideas. The patient is not nervous/anxious.    Blood pressure (!) 131/90, pulse 86, temperature 98.1 F (36.7 C), temperature source Oral, resp. rate 16, height 5' 6 (1.676 m), weight 57.6 kg, SpO2 100%. Body mass index is 20.5 kg/m.  Mental Status Per Nursing Assessment::   On Admission:  NA  Demographic Factors:  Male and Low socioeconomic status  Loss Factors: Decline in physical health  Historical Factors: NA  Risk Reduction Factors:   Positive coping skills or problem solving skills  Continued Clinical Symptoms:  More than one psychiatric diagnosis Previous Psychiatric Diagnoses and Treatments Medical Diagnoses and Treatments/Surgeries  Cognitive Features That Contribute To Risk:  Loss of executive function    Suicide Risk:  Minimal: No identifiable suicidal ideation.  Patients presenting with no risk factors but with morbid ruminations; may be classified as minimal risk based on the severity of the depressive symptoms   Follow-up Information     [facility] Follow up on 08/08/2024.   Why: Please call your ACTT services provider on 08/08/24 at 9:00 am to follow up. Contact information: [address] [place] [address] [phone]                  Plan Of Care/Follow-up recommendations:  Activity: as tolerated  Diet: heart healthy  Other: -Follow-up with your outpatient psychiatric provider -instructions on appointment date, time, and address (location) are provided to you in discharge paperwork.  -Take your psychiatric medications as prescribed at discharge - instructions are provided to you in the discharge paperwork  -Follow-up with outpatient primary care doctor and other specialists -for management of chronic medical disease, including: Routine Care.  -Testing: Follow-up with outpatient provider for abnormal lab results: None  -Recommend abstinence from alcohol, tobacco, and other illicit drug use at discharge.   -If your psychiatric symptoms recur, worsen, or if you have side effects to your psychiatric medications, call your outpatient psychiatric provider, 911, [phone] or go to the nearest emergency department.  -If suicidal thoughts recur, call your outpatient psychiatric provider, 911, [phone] or go to the nearest emergency department.   Marsa GORMAN Rosser, DO 08/10/2024, 8:38 AM

## 2024-08-10 NOTE — Progress Notes (Signed)
  [facility] Adult Case Management Discharge Plan :  Will you be returning to the same living situation after discharge:  Yes,  pt returning home with family after discharge.  At discharge, do you have transportation home?: Yes,  pt will be transported home by  his mother, [name] at 10am Do you have the ability to pay for your medications: Yes,  pt is insured - [employer]  Release of information consent forms completed and in the chart;  Patient's signature needed at discharge.  Patient to Follow up at:  Follow-up Information     [facility] Follow up on 08/08/2024.   Why: Please call your ACTT services provider on 08/08/24 at 9:00 am to follow up. Contact information: [address] [place] [address] [phone]                 Next level of care provider has access to [facility] Link:no  Safety Planning and Suicide Prevention discussed: Yes,  completed with [name] [facility]) [phone]     Has patient been referred to the Quitline?: Patient does not use tobacco/nicotine  products  Patient has been referred for addiction treatment: Patient refused referral for treatment.  Abrahim Sargent M Jaleisa Brose, LCSWA 08/10/2024, 9:19 AM

## 2024-08-10 NOTE — NC FL2 (Signed)
 [place]  MEDICAID [id number] LEVEL OF CARE FORM     IDENTIFICATION  Patient Name: Jacob [name] Birthdate: 01/04/1991 Sex: male Admission Date (Current Location): 08/03/2024  [place] and [place] Number:   Lloyd JASMINE [id number]    Facility and Address:        [facility] Provider Number:    Attending Physician Name and Address:  Raliegh Marsa RAMAN, DO  Relative Name and Phone Number:   [name] (mother) [phone]    Current Level of Care:  Living independently Recommended Level of Care:  Assisted living Prior Approval Number:    Date Approved/Denied:   PASRR Number:    Discharge Plan:  Patient to discharge to home with his mother    Current Diagnoses: Patient Active Problem List   Diagnosis Date Noted   Oth stimulant use, unsp w stim-induce psych disorder, unsp (HCC) 08/04/2024   Schizoaffective disorder, bipolar type (HCC) 08/02/2024   DKA (diabetic ketoacidosis) (HCC) 07/29/2024    Orientation RESPIRATION BLADDER Height & Weight      Alert and oriented X4   Normal  Normal Weight: 57.6 kg Height:  5' 6 (167.6 cm)  BEHAVIORAL SYMPTOMS/MOOD NEUROLOGICAL BOWEL NUTRITION STATUS   Lethargic, but pleasant  Normal  Continent  Normal  AMBULATORY STATUS COMMUNICATION OF NEEDS Skin    Ambulatory  Patient able to communicate verbally    Normal                     Personal Care Assistance Level of Assistance   None required           Functional Limitations Info   None required          SPECIAL CARE FACTORS FREQUENCY   None required                    Contractures      Additional Factors Info   Not applicable               Current Medications (08/10/2024):  This is the current hospital active medication list Current Facility-Administered Medications  Medication Dose Route Frequency Provider Last Rate Last Admin   acetaminophen  (TYLENOL ) tablet 650 mg  650 mg Oral Q6H PRN Wilkie Majel RAMAN, FNP   650 mg at  08/07/24 1242   alum & mag hydroxide-simeth (MAALOX/MYLANTA) 200-200-20 MG/5ML suspension 30 mL  30 mL Oral Q4H PRN Starkes-Perry, Takia S, FNP       dextromethorphan-guaiFENesin (MUCINEX DM) 30-600 MG per 12 hr tablet 1 tablet  1 tablet Oral BID PRN Starkes-Perry, Takia S, FNP       haloperidol  (HALDOL ) tablet 5 mg  5 mg Oral TID PRN Wilkie Majel RAMAN, FNP       And   diphenhydrAMINE  (BENADRYL ) capsule 50 mg  50 mg Oral TID PRN Starkes-Perry, Majel RAMAN, FNP       haloperidol  lactate (HALDOL ) injection 5 mg  5 mg Intramuscular TID PRN Starkes-Perry, Majel RAMAN, FNP       And   diphenhydrAMINE  (BENADRYL ) injection 50 mg  50 mg Intramuscular TID PRN Starkes-Perry, Majel RAMAN, FNP       And   LORazepam  (ATIVAN ) injection 2 mg  2 mg Intramuscular TID PRN Starkes-Perry, Takia S, FNP       haloperidol  lactate (HALDOL ) injection 10 mg  10 mg Intramuscular TID PRN Wilkie Majel RAMAN, FNP       And   diphenhydrAMINE  (BENADRYL ) injection 50 mg  50 mg  Intramuscular TID PRN Wilkie Majel RAMAN, FNP       And   LORazepam  (ATIVAN ) injection 2 mg  2 mg Intramuscular TID PRN Starkes-Perry, Takia S, FNP       docusate sodium  (COLACE) capsule 100 mg  100 mg Oral BID PRN Wilkie Majel RAMAN, FNP       haloperidol  (HALDOL ) tablet 5 mg  5 mg Oral QHS Towana Leita SAILOR, MD   5 mg at 08/09/24 2104   haloperidol  decanoate (HALDOL  DECANOATE) 100 MG/ML injection 200 mg  200 mg Intramuscular Q28 days Lenard Calin, MD   200 mg at 08/06/24 9157   insulin  aspart (novoLOG ) injection 0-20 Units  0-20 Units Subcutaneous TID WC Starkes-Perry, Takia S, FNP   20 Units at 08/09/24 1812   insulin  aspart (novoLOG ) injection 0-5 Units  0-5 Units Subcutaneous QHS Starkes-Perry, Takia S, FNP   3 Units at 08/09/24 2105   insulin  aspart (novoLOG ) injection 6 Units  6 Units Subcutaneous TID WC Butler, Laura N, MD   6 Units at 08/10/24 9285   insulin  glargine (LANTUS ) injection 18 Units  18 Units Subcutaneous BID Towana Leita SAILOR,  MD   18 Units at 08/09/24 1813   loratadine (CLARITIN) tablet 10 mg  10 mg Oral Daily PRN Butler, Laura N, MD       nicotine  (NICODERM CQ  - dosed in mg/24 hours) patch 21 mg  21 mg Transdermal Daily Starkes-Perry, Takia S, FNP   21 mg at 08/08/24 0858   sodium chloride  (OCEAN) 0.65 % nasal spray 1 spray  1 spray Each Nare PRN Wilkie Majel RAMAN, FNP       traZODone  (DESYREL ) tablet 50 mg  50 mg Oral QHS Starkes-Perry, Takia S, FNP   50 mg at 08/09/24 2104     Discharge Medications: Please see discharge summary for a list of discharge medications.  Relevant Imaging Results:  Relevant Lab Results:   Additional Information  Code: FULL  Allergies: No known drug allergies  Jacob [name], Jacob [name]

## 2024-08-10 NOTE — Discharge Summary (Signed)
 Physician Discharge Summary Note  Patient:  Jacob [name] is an 33 y.o., male MRN:  [id number] DOB:  05/31/1991 Patient phone:  [phone] (home)  Patient address:   [address] [place] [address],  Total Time spent with patient: 20 minutes  Date of Admission:  08/03/2024 Date of Discharge: 08/10/2024  Reason for Admission:   The patient was admitted to ICU on 9/28 after being found down unresponsive, tachycardic and hypothermic with severely elevated glucose levels - admitted for what was ultimately determined to be DKA and with superimposed encephalopathy and AKI. Initially on IV abx to r/o sepsis contributing, however infectious workup overall negative. DKA resolved with 15 U nightly of lantus  and resistance SSI with doses of scheduled lantus  18 UBID  and novolog  6 TID w/meals scheduled prior to discharge. Additional notable labs at admission included UDS positive for cocaine. Patient alert and oriented by 9/30, at which time psychiatry consultation was requested due to agitation (requiring restraints) discovered underlying schizoaffective disorder, BT and request for medication optimization.    On 10/1 psychiatry noted ongoing breakthrough psychotic symptoms including disorganized thoughts, delusional content. Although up-to-date on LAI (last given 07/11/24 and next due 08/08/24). Family collateral indicated recent disorganized and bizarre behavior as well as homelessness. Symptoms were concerning for break-through psychosis vs worsening of psychotic symptoms due to cocaine use. He was prescribed supplemental PO haldol  10 mg at bedtime (initiated 10/1) and with plan for increased dose of LAI at next injection. Patient boarded in the hospital until Ultimately medically cleared and transferred evening of 08/03/24.   Principal Problem: Schizoaffective disorder, bipolar type [facility]) Discharge Diagnoses: Principal Problem:   Schizoaffective disorder, bipolar type (HCC) Active Problems:   Oth  stimulant use, unsp w stim-induce psych disorder, unsp [facility])   Past Psychiatric History:  numerous admissions over the last several years for psychotic symptoms including delusional content, hallucinations and agitated behavior [facility], old [place], possibly [facility], [facility] and [place] ). Most recently admitted to [facility] February 2025. Psychotic symptoms have been confounded by substance use and poorly controled diabetes. He has had numerous admissions for DKA, some with noted encephalopathy. Previous diagnoses include schizoaffective disorder, Schizophrenia, bipolar disorder, stimulant use disorder (cocaine). It has been noted that mental illness appears to prevent management of diabetes to some degree and he has been following with an ACTT team (see number below). He has one prior suicide attempt in 2019. No self-harming behaviors. No history of trauma or anxiety symptoms. Usage of stimulants (cocaine) noted at various times with unclear pattern.   Past Medical History:  Past Medical History:  Diagnosis Date   Diabetes mellitus without complication (HCC)    History reviewed. No pertinent surgical history. Family History: History reviewed. No pertinent family history. Family Psychiatric  History:  None Reported   Social History:  Social History   Substance and Sexual Activity  Alcohol Use Not Currently     Social History   Substance and Sexual Activity  Drug Use Not Currently    Social History   Socioeconomic History   Marital status: Single    Spouse name: Not on file   Number of children: Not on file   Years of education: Not on file   Highest education level: Not on file  Occupational History   Not on file  Tobacco Use   Smoking status: Unknown   Smokeless tobacco: Not on file  Substance and Sexual Activity   Alcohol use: Not Currently   Drug use: Not  Currently   Sexual activity: Not Currently  Other Topics Concern   Not on file   Social History Narrative   Not on file   Social Drivers of Health   Financial Resource Strain: Not on file  Food Insecurity: Food Insecurity Present (08/03/2024)   Hunger Vital Sign    Worried About Running Out of Food in the Last Year: Sometimes true    Ran Out of Food in the Last Year: Sometimes true  Transportation Needs: Unmet Transportation Needs (08/03/2024)   PRAPARE - Administrator, Civil Service (Medical): Yes    Lack of Transportation (Non-Medical): Patient declined  Physical Activity: Not on file  Stress: Not on file  Social Connections: Patient Declined (08/03/2024)   Social Connection and Isolation Panel    Frequency of Communication with Friends and Family: Patient declined    Frequency of Social Gatherings with Friends and Family: Patient declined    Attends Religious Services: Patient declined    Database administrator or Organizations: Patient declined    Attends Banker Meetings: Patient declined    Marital Status: Patient declined    Hospital Course:   During the patient's hospitalization, patient had extensive initial psychiatric evaluation, and follow-up psychiatric evaluations every day.  Psychiatric diagnoses provided upon initial assessment: Schizoaffective Disorder, Bipolar Type Oth stimulant use, unsp w stim-induce psych disorder, unsp (HCC)  Patient's psychiatric medications were adjusted on admission: He was continued on oral Haldol .   During the hospitalization, other adjustments were made to the patient's psychiatric medication regimen: He was given his Haldol  DEC LAI 200 mg on 10/6 (next due on 11/3) and his oral dose of Haldol  was decreased.  Patient's care was discussed during the interdisciplinary team meeting every day during the hospitalization.  The patient is not having side effects to prescribed psychiatric medication.  Gradually, patient started adjusting to milieu. The patient was evaluated each day by a clinical  provider to ascertain response to treatment. Improvement was noted by the patient's report of decreasing symptoms, improved sleep and appetite, affect, medication tolerance, behavior, and participation in unit programming.  Patient was asked each day to complete a self inventory noting mood, mental status, pain, new symptoms, anxiety and concerns.   Symptoms were reported as significantly decreased or resolved completely by discharge.  The patient reports that their mood is stable.  The patient denied having suicidal thoughts for more than 48 hours prior to discharge.  Patient denies having homicidal thoughts.  Patient denies having auditory hallucinations.  Patient denies any visual hallucinations or other symptoms of psychosis.  The patient was motivated to continue taking medication with a goal of continued improvement in mental health.   The patient reports their target psychiatric symptoms of disorganization responded well to the psychiatric medications, and the patient reports overall benefit other psychiatric hospitalization. Supportive psychotherapy was provided to the patient. The patient also participated in regular group therapy while hospitalized. Coping skills, problem solving as well as relaxation therapies were also part of the unit programming.  Labs were reviewed with the patient, and abnormal results were discussed with the patient.  The patient is able to verbalize their individual safety plan to this provider.  # It is recommended to the patient to continue psychiatric medications as prescribed, after discharge from the hospital.    # It is recommended to the patient to follow up with your outpatient psychiatric provider and PCP.  # It was discussed with the patient, the impact of  alcohol, drugs, tobacco have been there overall psychiatric and medical wellbeing, and total abstinence from substance use was recommended the patient.ed.  # Prescriptions provided or sent directly to  preferred pharmacy at discharge. Patient agreeable to plan. Given opportunity to ask questions. Appears to feel comfortable with discharge.    # In the event of worsening symptoms, the patient is instructed to call the crisis hotline, 911 and or go to the nearest ED for appropriate evaluation and treatment of symptoms. To follow-up with primary care provider for other medical issues, concerns and or health care needs  # Patient was discharged home with a plan to follow up as noted below.    On day of discharge he reports feeling better and ready to go.  He reports his sleep is good.  He reports his appetite is good.  He reports no SI, HI, or AVH.  Discussed with him the importance of taking his medications as prescribed and attending his follow up appointments and he reported understanding.  Discussed with him what to do in the event of a future crisis.  Discussed that he can go to [facility], go to the nearest ED, or call 911 or 988.   He reported understanding and had no concerns.  He was discharged home with his mother.   Physical Findings: AIMS: Facial and Oral Movements Muscles of Facial Expression: None Lips and Perioral Area: None Jaw: None Tongue: None,Extremity Movements Upper (arms, wrists, hands, fingers): None Lower (legs, knees, ankles, toes): None, Trunk Movements Neck, shoulders, hips: None, Global Judgements Severity of abnormal movements overall : None Incapacitation due to abnormal movements: None Patient's awareness of abnormal movements: No Awareness,  ,  , AIMS Total Score AIMS Total Score: 0 CIWA:    COWS:     Musculoskeletal: Strength & Muscle Tone: within normal limits Gait & Station: normal Patient leans: N/A   Psychiatric Specialty Exam:  Presentation  General Appearance:  Appropriate for Environment; Casual  Eye Contact: Good  Speech: Clear and Coherent; Normal Rate  Speech Volume: Normal  Handedness: Right   Mood and Affect   Mood: Euthymic  Affect: Appropriate; Congruent   Thought Process  Thought Processes: Coherent; Goal Directed  Descriptions of Associations:Intact  Orientation:Full (Time, Place and Person)  Thought Content:Logical; WDL  History of Schizophrenia/Schizoaffective disorder:No  Duration of Psychotic Symptoms:Greater than six months  Hallucinations:Hallucinations: None  Ideas of Reference:None  Suicidal Thoughts:Suicidal Thoughts: No  Homicidal Thoughts:Homicidal Thoughts: No   Sensorium  Memory: Immediate Fair; Recent Fair  Judgment: Fair  Insight: Fair   Art therapist  Concentration: Fair  Attention Span: Fair  Recall: [employer] of Knowledge: Fair  Language: Fair   Psychomotor Activity  Psychomotor Activity: Psychomotor Activity: Normal   Assets  Assets: Communication Skills; Resilience; Social Support   Sleep  Sleep: Sleep: Good  Estimated Sleeping Duration (Last 24 Hours): 8.75-9.75 hours   Physical Exam: Physical Exam Vitals and nursing note reviewed.  Constitutional:      General: He is not in acute distress.    Appearance: Normal appearance. He is normal weight. He is not ill-appearing or toxic-appearing.  HENT:     Head: Normocephalic and atraumatic.  Pulmonary:     Effort: Pulmonary effort is normal.  Musculoskeletal:        General: Normal range of motion.  Neurological:     General: No focal deficit present.     Mental Status: He is alert.    Review of Systems  Respiratory:  Negative  for cough and shortness of breath.   Cardiovascular:  Negative for chest pain.  Gastrointestinal:  Negative for abdominal pain, constipation, diarrhea, nausea and vomiting.  Neurological:  Negative for dizziness, weakness and headaches.  Psychiatric/Behavioral:  Negative for depression, hallucinations and suicidal ideas. The patient is not nervous/anxious.    Blood pressure (!) 131/90, pulse 86, temperature 98.1 F (36.7  C), temperature source Oral, resp. rate 16, height 5' 6 (1.676 m), weight 57.6 kg, SpO2 100%. Body mass index is 20.5 kg/m.   Social History   Tobacco Use  Smoking Status Unknown  Smokeless Tobacco Not on file   Tobacco Cessation:  A prescription for an FDA-approved tobacco cessation medication provided at discharge   Blood Alcohol level:  Lab Results  Component Value Date   [facility] <15 07/29/2024    Metabolic Disorder Labs:  No results found for: HGBA1C, MPG No results found for: PROLACTIN No results found for: CHOL, TRIG, HDL, CHOLHDL, VLDL, LDLCALC  See Psychiatric Specialty Exam and Suicide Risk Assessment completed by Attending Physician prior to discharge.  Discharge destination:  Home  Is patient on multiple antipsychotic therapies at discharge:  No   Has Patient had three or more failed trials of antipsychotic monotherapy by history:  No  Recommended Plan for Multiple Antipsychotic Therapies: NA  Discharge Instructions     Diet - low sodium heart healthy   Complete by: As directed    Increase activity slowly   Complete by: As directed       Allergies as of 08/10/2024   No Known Allergies      Medication List     TAKE these medications      Indication  haloperidol  5 MG tablet Commonly known as: HALDOL  Take 1 tablet (5 mg total) by mouth at bedtime.  Indication: Psychosis   haloperidol  decanoate 100 MG/ML injection Commonly known as: HALDOL  DECANOATE Inject 2 mLs (200 mg total) into the muscle every 28 (twenty-eight) days. Start taking on: September 03, 2024  Indication: Schizophrenia   insulin  aspart 100 UNIT/ML FlexPen Commonly known as: NOVOLOG  Inject 6 Units into the skin 3 (three) times daily with meals.  Indication: Type 1 Diabetes   insulin  glargine 100 UNIT/ML Solostar Pen Commonly known as: LANTUS  Inject 18 Units into the skin 2 (two) times daily.  Indication: Type 1 Diabetes   nicotine  21 mg/24hr patch Commonly  known as: NICODERM CQ  - dosed in mg/24 hours Place 1 patch (21 mg total) onto the skin daily.  Indication: Nicotine  Addiction   traZODone  50 MG tablet Commonly known as: DESYREL  Take 1 tablet (50 mg total) by mouth at bedtime.  Indication: Trouble Sleeping        Follow-up Information     [facility] Follow up on 08/08/2024.   Why: Please call your ACTT services provider on 08/08/24 at 9:00 am to follow up. Contact information: [address] [place] [address] [phone]                 Follow-up recommendations/Comments:   Activity: as tolerated   Diet: heart healthy   Other: -Follow-up with your outpatient psychiatric provider -instructions on appointment date, time, and address (location) are provided to you in discharge paperwork.   -Take your psychiatric medications as prescribed at discharge - instructions are provided to you in the discharge paperwork   -Follow-up with outpatient primary care doctor and other specialists -for management of chronic medical disease, including: Routine Care.   -Testing: Follow-up  with outpatient provider for abnormal lab results: None   -Recommend abstinence from alcohol, tobacco, and other illicit drug use at discharge.    -If your psychiatric symptoms recur, worsen, or if you have side effects to your psychiatric medications, call your outpatient psychiatric provider, 911, [phone] or go to the nearest emergency department.   -If suicidal thoughts recur, call your outpatient psychiatric provider, 911, 988 or go to the nearest emergency department.    Signed: Marsa GORMAN Rosser, DO 08/10/2024, 8:42 AM

## 2024-08-10 NOTE — Progress Notes (Signed)
(  Sleep Hours) -6.25  (Any PRNs that were needed, meds refused, or side effects to meds)- none  (Any disturbances and when (visitation, over night)-none  (Concerns raised by the patient)- none  (SI/HI/AVH)- denies

## 2024-08-14 ENCOUNTER — Other Ambulatory Visit: Payer: Self-pay | Admitting: Student

## 2024-08-14 ENCOUNTER — Other Ambulatory Visit: Payer: Self-pay

## 2024-08-14 ENCOUNTER — Ambulatory Visit: Payer: Self-pay | Admitting: Student

## 2024-08-14 ENCOUNTER — Encounter: Payer: Self-pay | Admitting: Student

## 2024-08-14 ENCOUNTER — Telehealth: Payer: Self-pay | Admitting: Student

## 2024-08-14 ENCOUNTER — Ambulatory Visit: Payer: MEDICAID | Admitting: Student

## 2024-08-14 VITALS — BP 117/63 | HR 97 | Temp 98.1°F | Ht 69.0 in | Wt 141.6 lb

## 2024-08-14 DIAGNOSIS — E1011 Type 1 diabetes mellitus with ketoacidosis with coma: Secondary | ICD-10-CM | POA: Diagnosis not present

## 2024-08-14 DIAGNOSIS — E1065 Type 1 diabetes mellitus with hyperglycemia: Secondary | ICD-10-CM | POA: Diagnosis not present

## 2024-08-14 DIAGNOSIS — Z720 Tobacco use: Secondary | ICD-10-CM

## 2024-08-14 DIAGNOSIS — F250 Schizoaffective disorder, bipolar type: Secondary | ICD-10-CM | POA: Diagnosis not present

## 2024-08-14 DIAGNOSIS — Z79899 Other long term (current) drug therapy: Secondary | ICD-10-CM

## 2024-08-14 LAB — BASIC METABOLIC PANEL WITH GFR
BUN/Creatinine Ratio: 29 — ABNORMAL HIGH (ref 9–20)
BUN: 19 mg/dL (ref 6–20)
CO2: 27 mmol/L (ref 20–29)
Calcium: 9.6 mg/dL (ref 8.7–10.2)
Chloride: 91 mmol/L — ABNORMAL LOW (ref 96–106)
Creatinine, Ser: 0.65 mg/dL — ABNORMAL LOW (ref 0.76–1.27)
Glucose: 702 mg/dL (ref 70–99)
Potassium: 5.3 mmol/L — ABNORMAL HIGH (ref 3.5–5.2)
Sodium: 129 mmol/L — ABNORMAL LOW (ref 134–144)
eGFR: 128 mL/min/1.73 (ref >59)

## 2024-08-14 LAB — POCT GLYCOSYLATED HEMOGLOBIN (HGB A1C): HbA1c POC (<> result, manual entry): >14 % — AB (ref 4.0–5.6)

## 2024-08-14 LAB — GLUCOSE, CAPILLARY: Glucose-Capillary: >600 mg/dL (ref 70–99)

## 2024-08-14 MED ORDER — INSULIN DEGLUDEC 100 UNIT/ML ~~LOC~~ SOPN: 30.0000 [IU] | PEN_INJECTOR | Freq: Once | SUBCUTANEOUS | Status: DC

## 2024-08-14 NOTE — Telephone Encounter (Signed)
 Paged by [facility] for critical result Glucose > 600 in BMP. Dr Harrie is notified.

## 2024-08-14 NOTE — Assessment & Plan Note (Signed)
 Stable at present. Recent psychosis required hospitalization. Receives haldol  injections and takes oral haldol  as well. He has an appointment with psychiatry tomorrow morning.  - No acute issues, mood stable, management per psychiatry

## 2024-08-14 NOTE — Assessment & Plan Note (Addendum)
 HFU for DKA earlier this month, ICU care and issue resolved around 10/2 then transfer to [facility] for acute psychosis and discharge 10/10. Today feels well but glucose above 600 on fingerstick. Did not take insulin  yet today, but was taking it through yesterday. Asymptomatic, not ill. Will collect stat BMP to calculate an anion gap. Will have him take 30 units of his long acting insulin  degludec here in office. If BMP concerning for DKA, I will call to request that he go to ED. - BMP pending - Degludec 30u self administered in office - Continue regimen of degludec 30 daily and aspart 9 with meals - Dexcom G7 sensor placed today  ADD: Stat BMP result glucose over 700, no anion gap, normal bicarb. Not consistent with DKA. He administered his insulin  after this test and remained in a normal and appropriate state of health. Recs as above.

## 2024-08-14 NOTE — Telephone Encounter (Signed)
 BMP result given to Dr Harrie.

## 2024-08-14 NOTE — Progress Notes (Signed)
   CC: HFU DKA and acute psychosis  HPI:  [name] is a 33 y.o. male with a PMH stated below who presents today for hospital follow up. He has no acute concerns. He shares he has not yet taken his insulin  today.  Please see problem based assessment and plan for additional details.  Past Medical History:  Diagnosis Date   Bipolar 1 disorder (HCC)    Diabetes mellitus without complication (HCC)    History of attempted suicide 2019   Unsuccessful suicide attempt in 2019 with rat poison   Schizoaffective disorder (HCC)    Type 1 diabetes mellitus on insulin  therapy (HCC) 2019   Vitamin D  deficiency 01/03/2024   Review of Systems: ROS negative except for what is noted on the assessment and plan.  Vitals:   08/14/24 1050  BP: 117/63  Pulse: 97  Temp: 98.1 F (36.7 C)  TempSrc: Oral  SpO2: 98%  Weight: 141 lb 9.6 oz (64.2 kg)  Height: 5' 9 (1.753 m)   Physical Exam: Constitutional: well-appearing man in no acute distress HENT: normocephalic atraumatic, mucous membranes moist Cardiovascular: regular rate and rhythm, no m/r/g Pulmonary/Chest: normal work of breathing on room air, lungs clear to auscultation bilaterally Abdominal: soft, non-tender, non-distended MSK: normal bulk and tone Neurological: alert & oriented x 3, no focal deficit Skin: warm and dry Psych: normal mood and behavior  Assessment & Plan:   Patient discussed with Dr. CHARLENA Eastern  Hyperglycemia due to type 1 diabetes mellitus (HCC) HFU for DKA earlier this month, ICU care and issue resolved around 10/2 then transfer to [facility] for acute psychosis and discharge 10/10. Today feels well but glucose above 600 on fingerstick. Did not take insulin  yet today, but was taking it through yesterday. Asymptomatic, not ill. Will collect stat BMP to calculate an anion gap. Will have him take 30 units of his long acting insulin  degludec here in office. If BMP concerning for DKA, I will call to request that he go to  ED. - BMP pending - Degludec 30u self administered in office - Continue regimen of degludec 30 daily and aspart 9 with meals - Dexcom G7 sensor placed today  ADD: Stat BMP result glucose over 700, no anion gap, normal bicarb. Not consistent with DKA. He administered his insulin  after this test and remained in a normal and appropriate state of health. Recs as above.  Schizoaffective disorder, bipolar type (HCC) Stable at present. Recent psychosis required hospitalization. Receives haldol  injections and takes oral haldol  as well. He has an appointment with psychiatry tomorrow morning.  - No acute issues, mood stable, management per psychiatry  RTC 3 months checkup  Lonni Africa, D.O. [facility] Internal Medicine, PGY-2 Phone: [phone] Date 08/14/2024 Time 3:19 PM

## 2024-08-15 NOTE — Progress Notes (Signed)
 Internal Medicine Clinic Attending  I was physically present during the key portions of the resident provided service and participated in the medical decision making of patient's management care. I reviewed pertinent patient test results.  The assessment, diagnosis, and plan were formulated together and I agree with the documentation in the resident's note.  Rosan Dayton BROCKS, DO

## 2024-08-16 ENCOUNTER — Other Ambulatory Visit: Payer: Self-pay

## 2024-08-24 NOTE — Telephone Encounter (Signed)
 Copied from CRM #[id number]. Topic: Appointments - Appointment Scheduling >> Aug 22, 2024  1:58 PM Marda MATSU wrote: [name] calling to set up an appointment for her son with [name] for Diabetes ...   Please advise

## 2024-08-29 ENCOUNTER — Ambulatory Visit (INDEPENDENT_AMBULATORY_CARE_PROVIDER_SITE_OTHER): Payer: MEDICAID | Admitting: Dietician

## 2024-08-29 ENCOUNTER — Other Ambulatory Visit: Payer: Self-pay | Admitting: Dietician

## 2024-08-29 ENCOUNTER — Other Ambulatory Visit: Payer: Self-pay

## 2024-08-29 VITALS — Wt 142.4 lb

## 2024-08-29 DIAGNOSIS — E1011 Type 1 diabetes mellitus with ketoacidosis with coma: Secondary | ICD-10-CM

## 2024-08-29 MED ORDER — DEXCOM G7 SENSOR MISC
3 refills | Status: AC
  Filled 2024-08-29: qty 3, 30d supply, fill #0
  Filled 2024-09-27: qty 3, 30d supply, fill #1
  Filled 2024-10-25 – 2024-11-07 (×2): qty 3, 30d supply, fill #2
  Filled 2024-11-30: qty 3, 30d supply, fill #3

## 2024-08-29 NOTE — Telephone Encounter (Signed)
 Refill request

## 2024-08-29 NOTE — Progress Notes (Signed)
 Diabetes Self Management Education & Support Start time:918 am  End time: 1018 am Total time:  60 minutes  Mother and patient presented to the office today for diabetes check in. He was recently in the hospital for DKA. He stated he does not want to be in the hospital if possible.  Estimated body mass index is 21.03 kg/m as calculated from the following:   Height as of 08/14/24: 5' 9 (1.753 m).   Weight as of this encounter: 142 lb 6.4 oz (64.6 kg). Wt Readings from Last 10 Encounters:  08/29/24 142 lb 6.4 oz (64.6 kg)  08/14/24 141 lb 9.6 oz (64.2 kg)  08/03/24 127 lb (57.6 kg)  08/02/24 136 lb 14.5 oz (62.1 kg)  07/25/24 131 lb (59.4 kg)  07/14/24 131 lb (59.4 kg)  06/27/24 131 lb 12.8 oz (59.8 kg)  06/21/24 127 lb 13.9 oz (58 kg)  06/16/24 130 lb (59 kg)  05/28/24 130 lb (59 kg)  His weight is with in normal limits, he has steadily gained 12 #/weight over the past 3 months appropriately. He  reports a good appetite and mother reports increased thirst. His blood sugar appears better controlled today and his weight is consistent with this.  Lab Results  Component Value Date   HGBA1C >14.0 (A) 08/14/2024   HGBA1C 14.9 (H) 05/03/2024   HGBA1C 14.5 (H) 02/07/2024   HGBA1C 14.8 (H) 01/28/2024   HGBA1C 11.4 (H) 10/23/2023      Mathews checked his blood sugar while in the office on his meter and it was 311 after drinking sweet lemonade and coffee with sugar at 830 AM. He had also taken 35 units lantus  and 9 units Novolog  before drinking these liquids. He also placed a Dexcom G7 sensor on his left arm and before leaving the office sated it read 219 with a steady trend arrow. It will benefit hi to have continuous glucose monitoring as mush as possible. He is proficient at placing the sensor, his mother to be trained at next visit.  Advised then to always carry CGM and or meter and Novolog  insulin  with them when away from home.    His mother brought a bag of diabetes supplies and medicines   and asked for help going through to help her know if he needed the items and what they were for.  There were four bottles of medicines that he has not been taking: Vitamin D2 to take every 7 days, benztropine  Mes 0.5 mg tabs- take one tablet (0.5 mg)by mouth 2 times daily for prevention of EPS. , trazadone 50 mg by mouth at bedtime and  haldol  5 mg by mouth at bedtime.  These medication were discussed with the attending physician- patient was advised to take the vitamin D2 , haldol  and trazadone as they had been prescribed. Sent message to prescriber ETTER Mac Bolster, NP) about him not taking the benztropine  MES 0.5 mg tab  Plan: follow up in 12 days to train mother on Continuous glucose monitoring and to monitor his insulin  taking, weight and blood sugars.  Due to follow up with IM doctor in 3 months, assisted mother with scheduling with [facility] counselor today. A1c due again 11/14/24 Requested prescription for sensors today and discussed care with attending physician.  [name] Hole, RD 08/29/2024 11:26 AM.

## 2024-08-29 NOTE — Patient Instructions (Addendum)
 Be sure to take your Long acting insulin  (Tresiba  blue and green) insulin  30 units every day.  Take your Novolog  9 units before breakfast, lunch and dinner  Always have a Dexcom G7 or  blood sugar meter to check your blood sugar.  Bring another Dexcom G7 sensor to your appointment on November 10 at 10:15 am .  [name] [phone] (new number)      Start haldol  and benztropine  at bedtimeper doctor today and continue to take it per psychiatrist   Please call your ACTT services provider on 08/08/24 at 9:00 am to follow up. Contact information: [address] [place] [address] [phone]  Darian appointment: November 19th  at 130 PM

## 2024-09-05 NOTE — Progress Notes (Signed)
 Patient was discussed and medications reviewed and Jacob [name]'s documentation accurately reflects our decision-making.

## 2024-09-06 ENCOUNTER — Ambulatory Visit (INDEPENDENT_AMBULATORY_CARE_PROVIDER_SITE_OTHER): Payer: MEDICAID | Admitting: Mental Health

## 2024-09-06 DIAGNOSIS — F1290 Cannabis use, unspecified, uncomplicated: Secondary | ICD-10-CM

## 2024-09-06 DIAGNOSIS — F250 Schizoaffective disorder, bipolar type: Secondary | ICD-10-CM | POA: Diagnosis not present
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
## 2024-09-10 ENCOUNTER — Ambulatory Visit: Payer: MEDICAID | Admitting: Dietician

## 2024-09-10 ENCOUNTER — Ambulatory Visit (INDEPENDENT_AMBULATORY_CARE_PROVIDER_SITE_OTHER): Payer: MEDICAID | Admitting: Student

## 2024-09-10 ENCOUNTER — Encounter: Payer: Self-pay | Admitting: Student

## 2024-09-10 ENCOUNTER — Other Ambulatory Visit: Payer: Self-pay

## 2024-09-10 VITALS — BP 126/85 | HR 89 | Temp 97.7°F | Ht 69.0 in | Wt 141.4 lb

## 2024-09-10 VITALS — Wt 141.4 lb

## 2024-09-10 DIAGNOSIS — E119 Type 2 diabetes mellitus without complications: Secondary | ICD-10-CM

## 2024-09-10 DIAGNOSIS — Z23 Encounter for immunization: Secondary | ICD-10-CM

## 2024-09-10 DIAGNOSIS — E1011 Type 1 diabetes mellitus with ketoacidosis with coma: Secondary | ICD-10-CM

## 2024-09-10 DIAGNOSIS — F250 Schizoaffective disorder, bipolar type: Secondary | ICD-10-CM

## 2024-09-10 DIAGNOSIS — Z59819 Housing instability, housed unspecified: Secondary | ICD-10-CM | POA: Diagnosis not present

## 2024-09-10 DIAGNOSIS — Z794 Long term (current) use of insulin: Secondary | ICD-10-CM | POA: Diagnosis not present

## 2024-09-10 DIAGNOSIS — E1065 Type 1 diabetes mellitus with hyperglycemia: Secondary | ICD-10-CM

## 2024-09-10 MED ORDER — TRESIBA FLEXTOUCH 100 UNIT/ML ~~LOC~~ SOPN
32.0000 [IU] | PEN_INJECTOR | Freq: Every day | SUBCUTANEOUS | 3 refills | Status: DC
  Filled 2024-09-10: qty 30, 86d supply, fill #0
  Filled 2024-09-10: qty 12, 35d supply, fill #0
  Filled 2024-09-19: qty 27, 84d supply, fill #0
  Filled 2024-09-24: qty 30, 86d supply, fill #0

## 2024-09-10 MED ORDER — NOVOLOG FLEXPEN 100 UNIT/ML ~~LOC~~ SOPN
11.0000 [IU] | PEN_INJECTOR | Freq: Three times a day (TID) | SUBCUTANEOUS | 3 refills | Status: AC
  Filled 2024-09-10 – 2024-09-27 (×2): qty 15, 45d supply, fill #0
  Filled 2024-11-23 (×2): qty 15, 45d supply, fill #1

## 2024-09-10 NOTE — Assessment & Plan Note (Addendum)
 Status: Uncontrolled.Last A1c >14 in 08/2024.  Currently taking Tresiba  35 units (although prescribed 30 units), NovoLog  9 units with meals.  Monitor: CGM. Patient endorses morning hypoglycemia.  Per review of Dexcom, average blood glucose is 266. Target 21%, High 77%, Low 2%. Noted post-prandial hyperglycemia.   Plan -DECREASE Tresiba  to 32 units daily -INCREASE Novolog  to 11 units TID AC -CGM monitoring -A1c in 2 months -Urine ACR: 05/2024 -Referral to re-establish w/ endocrinology (previously seen by [facility])   Orders:   insulin  degludec (TRESIBA  FLEXTOUCH) 100 UNIT/ML FlexTouch Pen; Inject 32 Units into the skin daily. May increase up to 35 units daily if instructed by your provider.   insulin  aspart (NOVOLOG  FLEXPEN) 100 UNIT/ML FlexPen; Inject 11 Units into the skin 3 (three) times daily with meals. For diabetes control   Ambulatory referral to Endocrinology   Basic metabolic panel with GFR; Future

## 2024-09-10 NOTE — Assessment & Plan Note (Addendum)
 Admission to [facility] last month. States has not seen psychiatrist since d/c. Saw counselor on 11/6 as walk in. Prescribed Haldol  5 mg at bedtime which he has the bottle, did receive Haldol  DEC LAI on 10/6. Will send referral to re-establish w/ psychiatry if that has not been done. Crisis resources listed on AVS.   Orders:   Ambulatory referral to Psychiatry

## 2024-09-10 NOTE — Progress Notes (Addendum)
 Diabetes Self-Management Education  Visit Type: Annual Follow-Up (has had brief RD, CDCES interventions this year, but last formal DSMES was 11/15/23.)  Appt. Start Time: 1010 Appt. 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 male with a diagnosis of Diabetes:  .   ASSESSMENT  Weight 141 lb 6.4 oz (64.1 kg). Body mass index is 20.88 kg/m.  Patient care was discussed withy Dr. Elicia today. CGM data downloaded and provided to Dr. Elicia. A new CGM sensor was placed by both patient and his mother today successfully with reading of HI per patient after warm  up was complete. He states he had smoked weed last might and forgot his breakfast insulin  this morning.   Blood sugars are improved but pattern shows fasting hypoglycemia and rising after each meal and sharp decline overnight while he is not eating. Likely needs more prandial insulin .  CGM Results from download:   % Time CGM active:   98.5 %   (Goal >70%)  Average glucose:   266 mg/dL for 11 days  Glucose management indicator:   9.7 %  Time in range (70-180 mg/dL):   21 %   (Goal >29%)  Time High (181-250 mg/dL):   25 %   (Goal < 74%)  Time Very High (>250 mg/dL):    52 %   (Goal < 5%)  Time Low (54-69 mg/dL):   2 %   (Goal <5%)  Time Very Low (<54 mg/dL):   1 %   (Goal <8%)  Coefficient of variation:   38.8 %   (Goal <36%)      Diabetes Self-Management Education - 09/10/24 1100       Visit Information   Visit Type Annual Follow-Up   has had brief RD, CDCES interventions this year, but last formal DSMES was 11/15/23.     Health Coping   How would you rate your overall health? Good      Psychosocial Assessment   Patient Belief/Attitude about Diabetes Motivated to manage diabetes    What is the hardest part about your diabetes right now, causing you the most concern, or is the most worrisome to you about your diabetes?   Making healty food and beverage choices;Getting  support / problem solving    Self-care barriers Lack of material resources;Debilitated state due to current medical condition   mental health issues   Self-management support Family    Other persons present Parent    Patient Concerns Glycemic Control;Monitoring    Special Needs None;Simplified materials    Preferred Learning Style Hands on    Learning Readiness Ready    How often do you need to have someone help you when you read instructions, pamphlets, or other written materials from your doctor or pharmacy? 4 - Often    What is the last grade level you completed in school? 12      Pre-Education Assessment   Patient understands incorporating nutritional management into lifestyle. Comprehends key points   need to assess at follow up willingness to change   Patient understands using medications safely. Needs Review    Patient understands monitoring blood glucose, interpreting and using results Needs Review    Patient understands prevention, detection, and treatment of acute complications. Needs Review    Patient understands prevention, detection, and treatment of chronic complications. Needs Review    Patient understands how to develop strategies to address psychosocial issues. Comprehends key points  Complications   Last HgB A1C per patient/outside source 14 %    How often do you check your blood sugar? > 4 times/day    Fasting Blood glucose range (mg/dL) >799    Postprandial Blood glucose range (mg/dL) >799    Number of hypoglycemic episodes per month 1    Can you tell when your blood sugar is low? Yes   gets sweaty per patient   What do you do if your blood sugar is low? eats something like pistachios or juice, we discussed juice was more appropriate    Number of hyperglycemic episodes ( >200mg /dL): Daily    Can you tell when your blood sugar is high? Yes    What do you do if your blood sugar is high? takes insulin     Have you had a dilated eye exam in the past 12 months? No     Have you had a dental exam in the past 12 months? No    Are you checking your feet? Yes    How many days per week are you checking your feet? 7      Dietary Intake   Breakfast Glucerna      Activity / Exercise   Activity / Exercise Type Light (walking / raking leaves);ADL's;Moderate (swimming / aerobic walking)    How many days per week do you exercise? 7    How many minutes per day do you exercise? 60   mother says he constantly paces   Total minutes per week of exercise 420      Patient Education   Previous Diabetes Education Yes    Medications Reviewed patients medication for diabetes, action, purpose, timing of dose and side effects.    Monitoring Taught/evaluated CGM (comment)   his mother feels confident that she can assist Nat at placing CGM sensors continuously.   Acute complications Discussed and identified patients' prevention, symptoms, and treatment of hyperglycemia.;Taught prevention, symptoms, and  treatment of hypoglycemia - the 15 rule.    Chronic complications Applicable immunizations      Individualized Goals (developed by patient)   Monitoring  Consistenly use CGM      Patient Self-Evaluation of Goals - Patient rates self as meeting previously set goals (% of time)   Monitoring >75% (most of the time)      Post-Education Assessment   Patient understands using medications safely. Comphrehends key points    Patient understands monitoring blood glucose, interpreting and using results Comprehends key points    Patient understands prevention, detection, and treatment of acute complications. Comprehends key points    Patient understands prevention, detection, and treatment of chronic complications. Comprehends key points      Outcomes   Expected Outcomes Demonstrated interest in learning but significant barriers to change    Future DMSE 2 months    Program Status Completed      Subsequent Visit   Since your last visit have you continued or begun to take your  medications as prescribed? Yes    Since your last visit have you had your blood pressure checked? No    Since your last visit have you experienced any weight changes? No change    Since your last visit, are you checking your blood glucose at least once a day? No   he did not use his meter when CGM stopped, today we discussed when CGM stops to place anopther sensor right away         Individualized Plan for Diabetes Self-Management Training:  Learning Objective:  Patient will have a greater understanding of diabetes self-management. Patient education plan is to attend individual and/or group sessions per assessed needs and concerns.   Plan:   Patient Instructions  Please remove the current sensor when it stops in 10 days  Place a new sensor in a different place on the back of Jacob [name]'s arm and start it.  Always carry the receiver with you at all times.   Use 1/2 cup juice or a tablespoon of sugar to treat a low blood sugar.  Wait 15 minutes (until 3 new blood sugar readings pass) and your blood sugar should be rising. Eat a snack or a meal in the next hour.   Let's follow up in about 2 months unless you need to see me sooner. That is January 2026!  Take care and happy holidays!  [name] [phone] (new number)   Expected Outcomes:  Demonstrated interest in learning but significant barriers to change  Education material provided: Diabetes Resources  If problems or questions, patient to contact team via:  Phone  Future DSME appointment: 2 months [name] Hole, RD 09/10/2024 11:44 AM.

## 2024-09-10 NOTE — Assessment & Plan Note (Signed)
 Mother w/ patient today. Had FL2 form completed previously. Currently staying w/ family/friend. Hx of unhomed. Patient and pt's mother will contact who initiated FL2 for further updates.

## 2024-09-10 NOTE — Patient Instructions (Addendum)
 Thank you, [name] for allowing us  to provide your care today. Today we discussed:  -DECREASE Tresiba  (long-acting) to 32 units daily -INCREASE Novolog  (short-acting before meals) to 11 units before meals -Keep checking your blood sugar with your sensor -Referral for psychiatry and endocrinology   Here are some local behavioral health centers listed below: -[facility]: [phone]  If you feel like you may hurt yourself or others, or have thoughts about taking your own life, please get help right away. Here are some resources available to you: -Call your local emergency services (911) -Call the [facility] and [employer] Helpline [phone]) -Go to the nearest emergency room. -Call a suicide hotline to talk to a trained counselor.  1-800-273-TALK 425 393 7115) 1-800-SUICIDE 510 247 1517 (For Spanish speakers) 580 299 1907 (For TTY users) 1-866-4-U-TREVOR (610)324-4298) (For LGBTQ community)  69 (This is a crisis hotline you may call or text)   I have ordered the following medication/changed the following medications:  Start the following medications: Meds ordered this encounter  Medications   insulin  degludec (TRESIBA  FLEXTOUCH) 100 UNIT/ML FlexTouch Pen    Sig: Inject 32 Units into the skin daily. May increase up to 35 units daily if instructed by your provider.    Dispense:  30 mL    Refill:  3    Please d/c Lantus  and fill Tresiba  instead.   insulin  aspart (NOVOLOG  FLEXPEN) 100 UNIT/ML FlexPen    Sig: Inject 11 Units into the skin 3 (three) times daily with meals. For diabetes control    Dispense:  15 mL    Refill:  3     Follow up: 1 month   Remember:  Should you have any questions or concerns please call the internal medicine clinic at [phone].    Aubreyanna Dorrough, D.O. [facility]

## 2024-09-10 NOTE — Patient Instructions (Addendum)
 Please remove the current sensor when it stops in 10 days  Place a new sensor in a different place on the back of [place]'s arm and start it.  Always carry the receiver with you at all times.   Use 1/2 cup juice or a tablespoon of sugar to treat a low blood sugar.  Wait 15 minutes (until 3 new blood sugar readings pass) and your blood sugar should be rising. Eat a snack or a meal in the next hour.   Let's follow up in about 2 months unless you need to see me sooner. That is January 2026!  Take care and happy holidays!  [name] [phone] (new number)

## 2024-09-10 NOTE — Progress Notes (Signed)
 CC: T1DM  HPI: [name] is a 33 y.o. male living with a history stated below and presents today for T1DM. Please see problem based assessment and plan for additional details.  Past Medical History:  Diagnosis Date   Bipolar 1 disorder (HCC)    Diabetes mellitus without complication (HCC)    History of attempted suicide 2019   Unsuccessful suicide attempt in 2019 with rat poison   Schizoaffective disorder (HCC)    Type 1 diabetes mellitus on insulin  therapy (HCC) 2019   Vitamin D  deficiency 01/03/2024    Current Outpatient Medications on File Prior to Visit  Medication Sig Dispense Refill   benztropine  (COGENTIN ) 0.5 MG tablet Take 1 tablet (0.5 mg total) by mouth 2 (two) times daily. For prevention of EPS (Patient not taking: Reported on 05/04/2024) 60 tablet 0   Blood Glucose Monitoring Suppl (BLOOD GLUCOSE MONITOR SYSTEM) w/Device KIT Use in the morning, at noon, and at bedtime. 1 kit 0   Continuous Glucose Sensor (DEXCOM G7 SENSOR) MISC Place new sensor every 10 days. Use to monitor blood sugar continuously. 9 each 3   glucose blood (ACCU-CHEK GUIDE TEST) test strip Testing in the morning, at noon, and at bedtime. 90 each 3   haloperidol  (HALDOL ) 5 MG tablet Take 1 tablet (5 mg total) by mouth at bedtime. 30 tablet 0   haloperidol  decanoate (HALDOL  DECANOATE) 100 MG/ML injection Inject 1 mL (100 mg total) into the muscle every 30 (thirty) days. (Due on 03-09-24): For mood control 1 mL 0   haloperidol  decanoate (HALDOL  DECANOATE) 100 MG/ML injection Inject 2 mLs (200 mg total) into the muscle every 28 (twenty-eight) days. 1 mL 0   hydrOXYzine  (ATARAX ) 25 MG tablet Take 25 mg by mouth 3 (three) times daily as needed for anxiety.     ibuprofen  (ADVIL ) 200 MG tablet Take 400 mg by mouth 2 (two) times daily as needed for headache or moderate pain (pain score 4-6).     Insulin  Pen Needle 32G X 4 MM MISC Inject 1 each into the skin 3 (three) times daily. 100 each 0   nicotine   (NICODERM CQ  - DOSED IN MG/24 HOURS) 21 mg/24hr patch Place 1 patch (21 mg total) onto the skin daily. 28 patch 0   traZODone  (DESYREL ) 50 MG tablet Take 50 mg by mouth at bedtime. (Patient not taking: Reported on 07/25/2024)     traZODone  (DESYREL ) 50 MG tablet Take 1 tablet (50 mg total) by mouth at bedtime. 30 tablet 0   No current facility-administered medications on file prior to visit.    Family History  Problem Relation Age of Onset   Healthy Mother    Healthy Father     Social History   Socioeconomic History   Marital status: Single    Spouse name: Not on file   Number of children: Not on file   Years of education: Not on file   Highest education level: Not on file  Occupational History   Not on file  Tobacco Use   Smoking status: Every Day   Smokeless tobacco: Never   Tobacco comments:    States he is trying to quit  Vaping Use   Vaping status: Every Day  Substance and Sexual Activity   Alcohol use: Not Currently    Comment: social/occassional   Drug use: Not Currently    Types: Marijuana   Sexual activity: Not Currently  Other Topics Concern   Not on file  Social History Narrative   **  Merged History Encounter **       Social Drivers of Health   Financial Resource Strain: Low Risk  (05/24/2024)   Overall Financial Resource Strain (CARDIA)    Difficulty of Paying Living Expenses: Not very hard  Food Insecurity: No Food Insecurity (09/10/2024)   Hunger Vital Sign    Worried About Running Out of Food in the Last Year: Never true    Ran Out of Food in the Last Year: Never true  Recent Concern: Food Insecurity - Food Insecurity Present (08/03/2024)   Hunger Vital Sign    Worried About Running Out of Food in the Last Year: Sometimes true    Ran Out of Food in the Last Year: Sometimes true  Transportation Needs: No Transportation Needs (09/10/2024)   PRAPARE - Administrator, Civil Service (Medical): No    Lack of Transportation (Non-Medical): No   Recent Concern: Transportation Needs - Unmet Transportation Needs (08/03/2024)   PRAPARE - Transportation    Lack of Transportation (Medical): Yes    Lack of Transportation (Non-Medical): Patient declined  Physical Activity: Sufficiently Active (09/10/2024)   Exercise Vital Sign    Days of Exercise per Week: 7 days    Minutes of Exercise per Session: 90 min  Stress: No Stress Concern Present (05/24/2024)   [employer] of Occupational Health - Occupational Stress Questionnaire    Feeling of Stress: Not at all  Social Connections: Patient Declined (08/03/2024)   Social Connection and Isolation Panel    Frequency of Communication with Friends and Family: Patient declined    Frequency of Social Gatherings with Friends and Family: Patient declined    Attends Religious Services: Patient declined    Database Administrator or Organizations: Patient declined    Attends Banker Meetings: Patient declined    Marital Status: Patient declined  Intimate Partner Violence: Not At Risk (08/03/2024)   Humiliation, Afraid, Rape, and Kick questionnaire    Fear of Current or Ex-Partner: No    Emotionally Abused: No    Physically Abused: No    Sexually Abused: No    Review of Systems: ROS negative except for what is noted on the assessment and plan.  Vitals:   09/10/24 1053  BP: 126/85  Pulse: 89  Temp: 97.7 F (36.5 C)  TempSrc: Oral  SpO2: 96%  Weight: 141 lb 6.4 oz (64.1 kg)  Height: 5' 9 (1.753 m)   Physical Exam: Constitutional: alert, sitting up in chair, in no acute distress Cardiovascular: regular rate and rhythm Pulmonary/Chest: normal work of breathing on room air Abdominal: soft, non-tender, non-distended Neurological: alert & oriented x 3 Skin: warm and dry Psych: appropriate and cooperative behavior, denies SI/HI  Assessment & Plan:   Assessment & Plan Uncontrolled type 1 diabetes mellitus with hyperglycemia, with long-term current use of insulin   (HCC) Status: Uncontrolled.Last A1c >14 in 08/2024.  Currently taking Tresiba  35 units (although prescribed 30 units), NovoLog  9 units with meals.  Monitor: CGM. Patient endorses morning hypoglycemia.  Per review of Dexcom, average blood glucose is 266. Target 21%, High 77%, Low 2%. Noted post-prandial hyperglycemia.   Plan -DECREASE Tresiba  to 32 units daily -INCREASE Novolog  to 11 units TID AC -CGM monitoring -A1c in 2 months -Urine ACR: 05/2024 -Referral to re-establish w/ endocrinology (previously seen by [facility])   Orders:   insulin  degludec (TRESIBA  FLEXTOUCH) 100 UNIT/ML FlexTouch Pen; Inject 32 Units into the skin daily. May increase up to 35 units daily if instructed by  your provider.   insulin  aspart (NOVOLOG  FLEXPEN) 100 UNIT/ML FlexPen; Inject 11 Units into the skin 3 (three) times daily with meals. For diabetes control   Ambulatory referral to Endocrinology   Basic metabolic panel with GFR; Future  Schizoaffective disorder, bipolar type [facility]) Admission to [facility] last month. States has not seen psychiatrist since d/c. Saw counselor on 11/6 as walk in. Prescribed Haldol  5 mg at bedtime which he has the bottle, did receive Haldol  DEC LAI on 10/6. Will send referral to re-establish w/ psychiatry if that has not been done. Crisis resources listed on AVS.   Orders:   Ambulatory referral to Psychiatry  Housing instability Mother w/ patient today. Had FL2 form completed previously. Currently staying w/ family/friend. Hx of unhomed. Patient and pt's mother will contact who initiated FL2 for further updates.     Immunization due Orders:   Pneumococcal conjugate vaccine 20-valent (Prevnar 20)   Tdap vaccine greater than or equal to 7yo IM     Return in about 4 weeks (around 10/08/2024) for diabetes.   Patient discussed with Dr. Machen  Allecia Bells, D.O. [facility], PGY-3 Clinic Phone: [phone] Date 09/10/2024 Time 11:49 AM

## 2024-09-10 NOTE — Assessment & Plan Note (Deleted)
  Orders:   insulin  aspart (NOVOLOG  FLEXPEN) 100 UNIT/ML FlexPen; Inject 11 Units into the skin 3 (three) times daily with meals. For diabetes control

## 2024-09-11 ENCOUNTER — Other Ambulatory Visit: Payer: Self-pay

## 2024-09-11 NOTE — Progress Notes (Signed)
 Internal Medicine Clinic Attending  Case discussed with the resident at the time of the visit.  We reviewed the resident's history and exam and pertinent patient test results.  I agree with the assessment, diagnosis, and plan of care documented in the resident's note.

## 2024-09-18 ENCOUNTER — Other Ambulatory Visit: Payer: Self-pay

## 2024-09-19 ENCOUNTER — Other Ambulatory Visit: Payer: Self-pay

## 2024-09-20 ENCOUNTER — Other Ambulatory Visit: Payer: Self-pay

## 2024-09-24 ENCOUNTER — Encounter (HOSPITAL_COMMUNITY): Payer: Self-pay | Admitting: Psychiatry

## 2024-09-24 ENCOUNTER — Other Ambulatory Visit (HOSPITAL_COMMUNITY): Payer: Self-pay

## 2024-09-24 ENCOUNTER — Ambulatory Visit (INDEPENDENT_AMBULATORY_CARE_PROVIDER_SITE_OTHER): Payer: MEDICAID | Admitting: Psychiatry

## 2024-09-24 ENCOUNTER — Other Ambulatory Visit: Payer: Self-pay

## 2024-09-24 DIAGNOSIS — F250 Schizoaffective disorder, bipolar type: Secondary | ICD-10-CM

## 2024-09-24 DIAGNOSIS — F411 Generalized anxiety disorder: Secondary | ICD-10-CM | POA: Diagnosis not present

## 2024-09-24 DIAGNOSIS — F17200 Nicotine dependence, unspecified, uncomplicated: Secondary | ICD-10-CM

## 2024-09-24 MED ORDER — HALOPERIDOL DECANOATE 100 MG/ML IM SOLN: 200.0000 mg | INTRAMUSCULAR | 11 refills | Status: AC

## 2024-09-24 MED ORDER — NICOTINE 21 MG/24HR TD PT24
21.0000 mg | MEDICATED_PATCH | Freq: Every day | TRANSDERMAL | 3 refills | Status: AC
  Filled 2024-09-24: qty 28, 28d supply, fill #0
  Filled 2024-10-17 – 2024-11-07 (×3): qty 28, 28d supply, fill #1

## 2024-09-24 MED ORDER — HALOPERIDOL 5 MG PO TABS
5.0000 mg | ORAL_TABLET | Freq: Every day | ORAL | 0 refills | Status: DC
  Filled 2024-09-24: qty 5, 5d supply, fill #0

## 2024-09-24 MED ORDER — HYDROXYZINE HCL 25 MG PO TABS
25.0000 mg | ORAL_TABLET | Freq: Three times a day (TID) | ORAL | 3 refills | Status: AC | PRN
  Filled 2024-09-24: qty 90, 30d supply, fill #0
  Filled 2024-11-15: qty 90, 30d supply, fill #1

## 2024-09-24 NOTE — Progress Notes (Signed)
 Psychiatric Initial Adult Assessment  Virtual Visit via Video Note  I connected with Jacob [name] on 09/24/24 at  1:00 PM EST by a video enabled telemedicine application and verified that I am speaking with the correct person using two identifiers.  Location: Patient: office Provider: home office   I discussed the limitations of evaluation and management by telemedicine and the availability of in person appointments. The patient expressed understanding and agreed to proceed.  I provided 45 minutes of non-face-to-face time during this encounter.    Patient Identification: Jacob [name] MRN:  [id number] Date of Evaluation:  09/24/2024 Referral Source: [facility] Chief Complaint:  I want the shot Visit Diagnosis:    ICD-10-CM   1. Schizoaffective disorder, bipolar type (HCC)  F25.0 haloperidol  decanoate (HALDOL  DECANOATE) 100 MG/ML injection    haloperidol  (HALDOL ) 5 MG tablet    2. Tobacco dependence  F17.200 nicotine  (NICODERM CQ  - DOSED IN MG/24 HOURS) 21 mg/24hr patch    3. Anxiety state  F41.1 hydrOXYzine  (ATARAX ) 25 MG tablet       History of Present Illness: 33 year old male seen today for initial psychiatric evaluation. He has not been seen in the clinic in over a year. Patient was referred to outpatient psychiatry by [facility] where he was seen on 08/03/2024-08/10/2024. Per chart review patient was admitted to ICU on 9/28 after being found down unresponsive, tachycardic and hypothermic with severely elevated glucose levels - admitted for what was ultimately determined to be DKA. Psych was then consulted due to increased agitation and needing restraints. His medications were adjusted and he is now managed on   Haldol  5 mg nightly, Haldol  Deconate 200 mg every 28 days, hydroxyzine  25 mg three times daily, trazodone  50 mg nightly as needed and NicoDerm CQ  21 mg patches. Haldol  DEC LAI 200 mg was given on 08/06/2024. His next does was due on 09/03/2024 but patient reports that he did not receive  it. He also notes that he has discontinued all of his oral medications.   Today he is well-groomed, pleasant, cooperative, engages in conversation, and maintains eye contact.  Patient informed clinical research associate that he would like to restart the haldol  shot.  Patient was seen with his mother who reports that he was receiving services [employer] [employer] team.  She however notes that this was discontinued because they did not take his insurance.  Since his hospitalization she informed writer that he has not received another Haldol  injection.   Patient reports that he continues to be anxious and depressed.  He notes that he is homeless and currently does not have a job.  He also informed clinical research associate that he has not had a relationship with his 43 year old daughter which saddens him.  He is attempting to apply for disability but notes that they denied him.  Patient reports that he is appealing his denial.  Patient reports the above exacerbates his anxiety and depression.  Today provider conducted a GAD-7 and patient scored a 5.  Provider also conducted PHQ-9 and patient scored a 16.  He notes that his sleep is poor.  Today he endorses passive SI but denies wanting to harm himself.  He denies SI/HI/VAH or paranoia.   Patient's mother reports that he is having issues completing activities of daily living and does not care for himself like he should. She informed clinical research associate that he does not shower. She notes that she is attempting to help his disability get approved.   To cope patient notes that he smokes marijuana.  He does endorse using cocaine in the past.  He denies alcohol use.  He also reports smoking a pack of cigarettes every 2 days.  Today patient agreeable to restarting his Nicorette patch to help manage tobacco dependence.  He is also agreeable to take oral Haldol  for a few days prior to receiving his LAI of Haldol  200 mg every 28 days.  Patient agreeable to restarting hydroxyzine  25 mg 3 times daily as needed.  No other  concerns noted at this time.  Associated Signs/Symptoms: Depression Symptoms:  depressed mood, feelings of worthlessness/guilt, hopelessness, anxiety, increased appetite, (Hypo) Manic Symptoms:  Distractibility, Elevated Mood, Flight of Ideas, Licensed Conveyancer, Irritable Mood, Anxiety Symptoms:  Excessive Worry, Psychotic Symptoms:  Denies PTSD Symptoms: Had a traumatic exposure:  Reports that recent hospitalization was traumatic  Past Psychiatric History: Schizoaffective disorder, anxiety, substance use, stimulant induced psychiatric disorder, MDD with psychotic features and SI  Previous Psychotropic Medications: Yes have tried cogentin , gabepentin, hydroxyzine , and invega   Substance Abuse History in the last 12 months:  Yes.    Consequences of Substance Abuse: NA  Past Medical History:  Past Medical History:  Diagnosis Date   Bipolar 1 disorder (HCC)    Diabetes mellitus without complication (HCC)    History of attempted suicide 2019   Unsuccessful suicide attempt in 2019 with rat poison   Schizoaffective disorder (HCC)    Type 1 diabetes mellitus on insulin  therapy (HCC) 2019   Vitamin D  deficiency 01/03/2024   No past surgical history on file.  Family Psychiatric History: Mother ADHD, Brother depression,  Family History:  Family History  Problem Relation Age of Onset   Healthy Mother    Healthy Father     Social History:   Social History   Socioeconomic History   Marital status: Single    Spouse name: Not on file   Number of children: Not on file   Years of education: Not on file   Highest education level: Not on file  Occupational History   Not on file  Tobacco Use   Smoking status: Every Day   Smokeless tobacco: Never   Tobacco comments:    States he is trying to quit  Vaping Use   Vaping status: Every Day  Substance and Sexual Activity   Alcohol use: Not Currently    Comment: social/occassional   Drug use: Not Currently    Types:  Marijuana   Sexual activity: Not Currently  Other Topics Concern   Not on file  Social History Narrative   ** Merged History Encounter **       Social Drivers of Health   Financial Resource Strain: Low Risk  (05/24/2024)   Overall Financial Resource Strain (CARDIA)    Difficulty of Paying Living Expenses: Not very hard  Food Insecurity: No Food Insecurity (09/10/2024)   Hunger Vital Sign    Worried About Running Out of Food in the Last Year: Never true    Ran Out of Food in the Last Year: Never true  Recent Concern: Food Insecurity - Food Insecurity Present (08/03/2024)   Hunger Vital Sign    Worried About Running Out of Food in the Last Year: Sometimes true    Ran Out of Food in the Last Year: Sometimes true  Transportation Needs: No Transportation Needs (09/10/2024)   PRAPARE - Administrator, Civil Service (Medical): No    Lack of Transportation (Non-Medical): No  Recent Concern: Transportation Needs - Unmet Transportation Needs (08/03/2024)   PRAPARE -  Administrator, Civil Service (Medical): Yes    Lack of Transportation (Non-Medical): Patient declined  Physical Activity: Sufficiently Active (09/10/2024)   Exercise Vital Sign    Days of Exercise per Week: 7 days    Minutes of Exercise per Session: 90 min  Stress: No Stress Concern Present (05/24/2024)   [employer] of Occupational Health - Occupational Stress Questionnaire    Feeling of Stress: Not at all  Social Connections: Patient Declined (08/03/2024)   Social Connection and Isolation Panel    Frequency of Communication with Friends and Family: Patient declined    Frequency of Social Gatherings with Friends and Family: Patient declined    Attends Religious Services: Patient declined    Database Administrator or Organizations: Patient declined    Attends Banker Meetings: Patient declined    Marital Status: Patient declined    Additional Social History: Patient resides in  [place] and homeless. He is single and has one 76 year old daughter. He endorses smoking marijuana twice daily. He also notes that he smokes tobacco 1 pack every two days.  He denies alcohol use. He is currently unemployed.   Allergies:  No Known Allergies  Metabolic Disorder Labs: Lab Results  Component Value Date   HGBA1C >14.0 (A) 08/14/2024   MPG 380.93 05/03/2024   MPG 369 02/07/2024   Lab Results  Component Value Date   PROLACTIN 18.7 12/27/2023   Lab Results  Component Value Date   CHOL 201 (H) 01/28/2024   TRIG 85 01/28/2024   HDL 75 01/28/2024   CHOLHDL 2.7 01/28/2024   VLDL 17 01/28/2024   LDLCALC 109 (H) 01/28/2024   LDLCALC 77 12/27/2023   Lab Results  Component Value Date   TSH 0.926 01/28/2024    Therapeutic Level Labs: No results found for: LITHIUM No results found for: CBMZ No results found for: VALPROATE  Current Medications: Current Outpatient Medications  Medication Sig Dispense Refill   benztropine  (COGENTIN ) 0.5 MG tablet Take 1 tablet (0.5 mg total) by mouth 2 (two) times daily. For prevention of EPS (Patient not taking: Reported on 05/04/2024) 60 tablet 0   Blood Glucose Monitoring Suppl (BLOOD GLUCOSE MONITOR SYSTEM) w/Device KIT Use in the morning, at noon, and at bedtime. 1 kit 0   Continuous Glucose Sensor (DEXCOM G7 SENSOR) MISC Place new sensor every 10 days. Use to monitor blood sugar continuously. 9 each 3   glucose blood (ACCU-CHEK GUIDE TEST) test strip Testing in the morning, at noon, and at bedtime. 90 each 3   haloperidol  (HALDOL ) 5 MG tablet Take 1 tablet (5 mg total) by mouth at bedtime. 5 tablet 0   haloperidol  decanoate (HALDOL  DECANOATE) 100 MG/ML injection Inject 1 mL (100 mg total) into the muscle every 30 (thirty) days. (Due on 03-09-24): For mood control 1 mL 0   haloperidol  decanoate (HALDOL  DECANOATE) 100 MG/ML injection Inject 2 mLs (200 mg total) into the muscle every 28 (twenty-eight) days. 1 mL 11   hydrOXYzine   (ATARAX ) 25 MG tablet Take 1 tablet (25 mg total) by mouth 3 (three) times daily as needed for anxiety. 90 tablet 3   ibuprofen  (ADVIL ) 200 MG tablet Take 400 mg by mouth 2 (two) times daily as needed for headache or moderate pain (pain score 4-6).     insulin  aspart (NOVOLOG  FLEXPEN) 100 UNIT/ML FlexPen Inject 11 Units into the skin 3 (three) times daily with meals. For diabetes control 15 mL 3   insulin  degludec (TRESIBA   FLEXTOUCH) 100 UNIT/ML FlexTouch Pen Inject 32 Units into the skin daily. May increase up to 35 units daily if instructed by your provider. 30 mL 3   Insulin  Pen Needle 32G X 4 MM MISC Inject 1 each into the skin 3 (three) times daily. 100 each 0   nicotine  (NICODERM CQ  - DOSED IN MG/24 HOURS) 21 mg/24hr patch Place 1 patch (21 mg total) onto the skin daily. 28 patch 3   No current facility-administered medications for this visit.    Musculoskeletal: Strength & Muscle Tone: within normal limits Gait & Station: normal Patient leans: N/A  Psychiatric Specialty Exam: Review of Systems  There were no vitals taken for this visit.There is no height or weight on file to calculate BMI.  General Appearance: Well Groomed  Eye Contact:  Good  Speech:  Clear and Coherent and Normal Rate  Volume:  Normal  Mood:  Anxious and Depressed  Affect:  Appropriate and Congruent  Thought Process:  Coherent, Goal Directed and Linear  Orientation:  Full (Time, Place, and Person)  Thought Content:  WDL and Logical  Suicidal Thoughts:  No  Homicidal Thoughts:  No  Memory:  Immediate;   Good Recent;   Good Remote;   Good  Judgement:  Good  Insight:  Good  Psychomotor Activity:  Normal  Concentration:  Concentration: Good and Attention Span: Good  Recall:  Good  Fund of Knowledge:Good  Language: Good  Akathisia:  No  Handed:  Left  AIMS (if indicated):  Not done  Assets:  Communication Skills Desire for Improvement Financial Resources/Insurance Housing Social Support  ADL's:   Intact  Cognition: WNL  Sleep:  Fair   Screenings: AIMS    Flowsheet Row [facility]16[facility]2023 in  [facility] Office Visit from 08/31/2022 in [facility] Office Visit from 05/11/2022 in [facility] Office Visit from 01/28/2022 in [facility]  AIMS Total Score 0 0 0 0 0   AUDIT    Flowsheet Row Ad[facility] ([facility]) [facility] (Discharged) [facility]osp-Admission (Discharged) [facility] (Discharged) [facility] (Discharged) from OP [facility]  Alcohol Use Disorder Identification Test Final Score (AUDIT) 0 0 0 0 2   GAD-7    Flowsheet Row Office Visit from 09/24/2024 in [facility] Counselor from 09/06/2024 in [facility] Office Visit from 08/14/2024 in [facility] Of [place] Office Visit from 02/16/2023 in [facility] Office Visit from 08/31/2022 in [facility]  Total GAD-7 Score 15 20 21 1 5    PHQ2-9    Flowsheet Row Office Visit from 09/24/2024 in [facility] Office Visit from 09/10/2024 in [facility] - A Dept Of [place]. [facility] Counselor from 09/06/2024 in [facility] Office Visit from 08/14/2024 in [facility] - A Dept Of [place]. [facility] Office Visit from 03/20/2024 in [facility] - A Dept  Of . [facility]  PHQ-2 Total  Score 4 0 6 3 0  PHQ-9 Total Score 16 1 26 15  --   Flowsheet Row Counselor from 09/06/2024 in [facility] Admission (Discharged) from [facility]osp-Admission (Discharged) from 07/29/2024 in [facility]  C-SSRS RISK CATEGORY Moderate Risk No Risk No Risk    Assessment and Plan: Patient endorses symptoms of anxiety and depression. He missed his Haldol  Dec injection on 09/03/2024. Today he is agreeable to restarting Nicodrm CQ patches to help manage tobacco dependence. He is also agreeable to starting haldol  5 mg for a few days to help manage mood. He will recive his Haldol  Dec injection of 200 mg on 09/26/2024. Patient will restart hydroxyzine  25 mg three times daily as needed to help manage anxiety.   1. Schizoaffective disorder, bipolar type (HCC) (Primary)  Continue- haloperidol  decanoate (HALDOL  DECANOATE) 100 MG/ML injection; Inject 2 mLs (200 mg total) into the muscle every 28 (twenty-eight) days.  Dispense: 1 mL; Refill: 11 Restart- haloperidol  (HALDOL ) 5 MG tablet; Take 1 tablet (5 mg total) by mouth at bedtime.  Dispense: 5 tablet; Refill: 0  2. Tobacco dependence  Restart- nicotine  (NICODERM CQ  - DOSED IN MG/24 HOURS) 21 mg/24hr patch; Place 1 patch (21 mg total) onto the skin daily.  Dispense: 28 patch; Refill: 3  3. Anxiety state  Restart- hydrOXYzine  (ATARAX ) 25 MG tablet; Take 1 tablet (25 mg total) by mouth 3 (three) times daily as needed for anxiety.  Dispense: 90 tablet; Refill: 3  Follow up in 2 days with shot clinic Follow up in 3 months    Zane FORBES Bach, NP 11/24/20251:41 PM

## 2024-09-26 ENCOUNTER — Other Ambulatory Visit: Payer: Self-pay

## 2024-09-26 ENCOUNTER — Ambulatory Visit (INDEPENDENT_AMBULATORY_CARE_PROVIDER_SITE_OTHER): Payer: MEDICAID | Admitting: *Deleted

## 2024-09-26 VITALS — BP 127/98 | HR 88 | Resp 16 | Ht 69.0 in | Wt 141.4 lb

## 2024-09-26 DIAGNOSIS — F250 Schizoaffective disorder, bipolar type: Secondary | ICD-10-CM | POA: Diagnosis not present

## 2024-09-26 MED ORDER — HALOPERIDOL DECANOATE 100 MG/ML IM SOLN
200.0000 mg | INTRAMUSCULAR | Status: AC
  Administered 2024-09-26 – 2024-11-28 (×3): 200 mg via INTRAMUSCULAR

## 2024-09-26 NOTE — Progress Notes (Signed)
 In for the first time to shot clinic in a year. He had been getting mental health services thru [facility]. States he is not seeing them anymore but does have a case worker thru his insurance helping him with housing. Currently is living with his brother but he and his mom who accompanied him said brothers house is not a good arrangement for him. He is stable at this time and offers no complaints. Haldol  D 200 mg given today in his L Deltoid without issue. His mom states he is very restless and pacing. He does not take the Vistaril , may be from Haldol . He is to return next week to see a provider to discuss his concern and in 28 days for his shot.

## 2024-09-27 ENCOUNTER — Other Ambulatory Visit (HOSPITAL_COMMUNITY): Payer: Self-pay | Admitting: Psychiatry

## 2024-09-27 DIAGNOSIS — F250 Schizoaffective disorder, bipolar type: Secondary | ICD-10-CM

## 2024-09-28 ENCOUNTER — Other Ambulatory Visit: Payer: Self-pay

## 2024-10-01 ENCOUNTER — Other Ambulatory Visit: Payer: Self-pay

## 2024-10-02 ENCOUNTER — Telehealth (INDEPENDENT_AMBULATORY_CARE_PROVIDER_SITE_OTHER): Payer: MEDICAID | Admitting: Psychiatry

## 2024-10-02 ENCOUNTER — Other Ambulatory Visit: Payer: Self-pay

## 2024-10-02 DIAGNOSIS — G2571 Drug induced akathisia: Secondary | ICD-10-CM

## 2024-10-02 MED ORDER — PROPRANOLOL HCL 10 MG PO TABS
10.0000 mg | ORAL_TABLET | Freq: Two times a day (BID) | ORAL | 3 refills | Status: AC
  Filled 2024-10-02: qty 60, 30d supply, fill #0
  Filled 2024-10-25: qty 60, 30d supply, fill #1

## 2024-10-02 NOTE — Progress Notes (Signed)
 [facility] MD/PA/NP OP Progress Note  10/02/2024 10:59 AM Jacob [name]  MRN:  [id number]  Chief Complaint:  I cannot stop pacing Her mother  he walks and paces constantly  HPI:  33 year old male seen today for follow-up psychiatric evaluation.  He has a psychiatric history of  Schizoaffective disorder, anxiety, substance use, stimulant induced psychiatric disorder, MDD with psychotic features and SI .  He is currently managed on Haldol  5 mg nightly, Haldol  Deconate 200 mg every 28 days, hydroxyzine  25 mg three times daily, and NicoDerm CQ  21 mg patches.  He reports that medications are somewhat effective in managing psychiatric condition.  Today he is well-groomed, pleasant, cooperative, engages in conversation, and maintains eye contact.  Patient informed clinical research associate that he cannot stop pacing.  Patient was seen with his mother who notes that he walks constantly, paces, and is restless.  Patient describes having internal restlessness.  Provider informed patient that this sounds like akathisia and informed him that it is best treated with a beta-blocker.  He informed clinical research associate that he would be willing to try.    Overall he notes that his mood is stable and reports that he has minimal anxiety and depression.  Patient denies SI/HI/AVH, or mania today.  Today oral Haldol  discontinued.  Patient agreeable to starting propranolol  10 mg twice daily.  He will continue his other medications as prescribed.   Potential side effects of medication and risks vs benefits of treatment vs non-treatment were explained and discussed. All questions were answered. No other concerns noted at this time. Visit Diagnosis:    ICD-10-CM   1. Akathisia  G25.71 propranolol  (INDERAL ) 10 MG tablet      Past Psychiatric History:  Schizoaffective disorder, anxiety, substance use, stimulant induced psychiatric disorder, MDD with psychotic features and SI   Past Medical History:  Past Medical History:  Diagnosis Date   Bipolar 1  disorder (HCC)    Diabetes mellitus without complication (HCC)    History of attempted suicide 2019   Unsuccessful suicide attempt in 2019 with rat poison   Schizoaffective disorder (HCC)    Type 1 diabetes mellitus on insulin  therapy (HCC) 2019   Vitamin D  deficiency 01/03/2024   No past surgical history on file.  Family Psychiatric History: Mother ADHD, Brother depression,   Family History:  Family History  Problem Relation Age of Onset   Healthy Mother    Healthy Father     Social History:  Social History   Socioeconomic History   Marital status: Single    Spouse name: Not on file   Number of children: Not on file   Years of education: Not on file   Highest education level: Not on file  Occupational History   Not on file  Tobacco Use   Smoking status: Every Day   Smokeless tobacco: Never   Tobacco comments:    States he is trying to quit  Vaping Use   Vaping status: Every Day  Substance and Sexual Activity   Alcohol use: Not Currently    Comment: social/occassional   Drug use: Not Currently    Types: Marijuana   Sexual activity: Not Currently  Other Topics Concern   Not on file  Social History Narrative   ** Merged History Encounter **       Social Drivers of Health   Financial Resource Strain: Low Risk  (05/24/2024)   Overall Financial Resource Strain (CARDIA)    Difficulty of Paying Living Expenses: Not very hard  Food Insecurity: No  Food Insecurity (09/10/2024)   Hunger Vital Sign    Worried About Running Out of Food in the Last Year: Never true    Ran Out of Food in the Last Year: Never true  Recent Concern: Food Insecurity - Food Insecurity Present (08/03/2024)   Hunger Vital Sign    Worried About Running Out of Food in the Last Year: Sometimes true    Ran Out of Food in the Last Year: Sometimes true  Transportation Needs: No Transportation Needs (09/10/2024)   PRAPARE - Administrator, Civil Service (Medical): No    Lack of  Transportation (Non-Medical): No  Recent Concern: Transportation Needs - Unmet Transportation Needs (08/03/2024)   PRAPARE - Transportation    Lack of Transportation (Medical): Yes    Lack of Transportation (Non-Medical): Patient declined  Physical Activity: Sufficiently Active (09/10/2024)   Exercise Vital Sign    Days of Exercise per Week: 7 days    Minutes of Exercise per Session: 90 min  Stress: No Stress Concern Present (05/24/2024)   [employer] of Occupational Health - Occupational Stress Questionnaire    Feeling of Stress: Not at all  Social Connections: Patient Declined (08/03/2024)   Social Connection and Isolation Panel    Frequency of Communication with Friends and Family: Patient declined    Frequency of Social Gatherings with Friends and Family: Patient declined    Attends Religious Services: Patient declined    Database Administrator or Organizations: Patient declined    Attends Engineer, Structural: Patient declined    Marital Status: Patient declined    Allergies: No Known Allergies  Metabolic Disorder Labs: Lab Results  Component Value Date   HGBA1C >14.0 (A) 08/14/2024   MPG 380.93 05/03/2024   MPG 369 02/07/2024   Lab Results  Component Value Date   PROLACTIN 18.7 12/27/2023   Lab Results  Component Value Date   CHOL 201 (H) 01/28/2024   TRIG 85 01/28/2024   HDL 75 01/28/2024   CHOLHDL 2.7 01/28/2024   VLDL 17 01/28/2024   LDLCALC 109 (H) 01/28/2024   LDLCALC 77 12/27/2023   Lab Results  Component Value Date   TSH 0.926 01/28/2024   TSH 3.661 12/27/2023    Therapeutic Level Labs: No results found for: LITHIUM No results found for: VALPROATE No results found for: CBMZ  Current Medications: Current Outpatient Medications  Medication Sig Dispense Refill   propranolol (INDERAL) 10 MG tablet Take 1 tablet (10 mg total) by mouth 2 (two) times daily. 60 tablet 3   Blood Glucose Monitoring Suppl (BLOOD GLUCOSE MONITOR SYSTEM)  w/Device KIT Use in the morning, at noon, and at bedtime. 1 kit 0   Continuous Glucose Sensor (DEXCOM G7 SENSOR) MISC Place new sensor every 10 days. Use to monitor blood sugar continuously. 9 each 3   glucose blood (ACCU-CHEK GUIDE TEST) test strip Testing in the morning, at noon, and at bedtime. 100 each 3   haloperidol  decanoate (HALDOL  DECANOATE) 100 MG/ML injection Inject 1 mL (100 mg total) into the muscle every 30 (thirty) days. (Due on 03-09-24): For mood control 1 mL 0   haloperidol  decanoate (HALDOL  DECANOATE) 100 MG/ML injection Inject 2 mLs (200 mg total) into the muscle every 28 (twenty-eight) days. 1 mL 11   hydrOXYzine  (ATARAX ) 25 MG tablet Take 1 tablet (25 mg total) by mouth 3 (three) times daily as needed for anxiety. 90 tablet 3   ibuprofen  (ADVIL ) 200 MG tablet Take 400 mg by mouth 2 (  two) times daily as needed for headache or moderate pain (pain score 4-6).     insulin  aspart (NOVOLOG  FLEXPEN) 100 UNIT/ML FlexPen Inject 11 Units into the skin 3 (three) times daily with meals. For diabetes control 15 mL 3   insulin  degludec (TRESIBA  FLEXTOUCH) 100 UNIT/ML FlexTouch Pen Inject 32 Units into the skin daily. May increase up to 35 units daily if instructed by your provider. 30 mL 3   Insulin  Pen Needle 32G X 4 MM MISC Inject 1 each into the skin 3 (three) times daily. 100 each 0   nicotine  (NICODERM CQ  - DOSED IN MG/24 HOURS) 21 mg/24hr patch Place 1 patch (21 mg total) onto the skin daily. 28 patch 3   Current Facility-Administered Medications  Medication Dose Route Frequency Provider Last Rate Last Admin   haloperidol  decanoate (HALDOL  DECANOATE) 100 MG/ML injection 200 mg  200 mg Intramuscular Q28 days Harl Regan E, NP   200 mg at 09/26/24 1020     Musculoskeletal: Strength & Muscle Tone: within normal limits and telehealth visit Gait & Station: normal, telehealth visit Patient leans: N/A  Psychiatric Specialty Exam: Review of Systems  There were no vitals taken for  this visit.There is no height or weight on file to calculate BMI.  General Appearance: Well Groomed  Eye Contact:  Good  Speech:  Clear and Coherent and Normal Rate  Volume:  Normal  Mood:  Euthymic  Affect:  Appropriate and Congruent  Thought Process:  Coherent, Goal Directed, and Linear  Orientation:  Full (Time, Place, and Person)  Thought Content: WDL and Logical   Suicidal Thoughts:  No  Homicidal Thoughts:  No  Memory:  Immediate;   Good Recent;   Good Remote;   Good  Judgement:  Good  Insight:  Good  Psychomotor Activity:  Restlessness  Concentration:  Concentration: Good and Attention Span: Good  Recall:  Good  Fund of Knowledge: Good  Language: Good  Akathisia:  Yes  Handed:  Right  AIMS (if indicated): not done  Assets:  Communication Skills Desire for Improvement Housing Leisure Time Physical Health Social Support  ADL's:  Intact  Cognition: WNL  Sleep:  Good   Screenings: AIMS    Flowsheet Row [facility] from 02/16/2023 in [facility] Office Visit from 08/31/2022 in [facility] Office Visit from 05/11/2022 in [facility] Office Visit from 01[facility]28/2022 in [facility]  AIMS Total Score 0 0 0 0 0   AUDIT    Flowsheet Row [facility]) [facility] (Discharged) [facility] Hosp-Admission (Discharged) [facility] (Discharged) [facility] (Discharged) from [facility]  Alcohol Use Disorder Identification Test Final Score (AUDIT) 0 0 0 0 2   GAD-7    Flowsheet  Row Office Visit from 09/24/2024 in [facility] Counselor from 09/06/2024 in [facility] Office Visit from 08/14/2024 in [facility] Of [place]. [facility] Office Visit from 02/16/2023 in [facility] Office Visit from 08/31/2022 in [facility]  Total GAD-7 Score 15 20 21 1  5  [id number]    Flowsheet Row Office Visit from 09/24/2024 in [facility] Office Visit from 09/10/2024 in [facility] - [facility] Dept Of [place]. [facility] Counselor from 09/06/2024 in [facility] Office Visit from 08/14/2024 in [facility] - A Dept Of [place]. [facility] Office Visit from 03/20/2024 in [facility] - A Dept Of [place]. [facility]  PHQ-2 Total Score 4 0 6 3 0  PHQ-9 Total Score 16 1 26 15  --   Flowsheet Row Counselor from 09/06/2024 in [facility] Admission (Discharged) [facility]osp-Admission (Discharged) from 07/29/2024 in [facility]  C-SSRS RISK CATEGORY Moderate Risk No Risk No Risk     Assessment and Plan: Patient notes that his anxiety, depression, and mood are well-managed.  He does note however that he has been experiencing akathisia.  Today oral Haldol  discontinued.  Patient agreeable to starting propranolol 10 mg twice daily.  He will continue his other medications as prescribed.    1. Akathisia (Primary)  Start- propranolol (INDERAL) 10 MG tablet; Take 1 tablet (10 mg total) by mouth 2 (two) times daily.  Dispense: 60 tablet; Refill: 3   Collaboration of Care: Collaboration of Care: Other provider involved in patient's care AEB PCP and shot clinic staff  Patient/Guardian was advised Release of Information must  be obtained prior to any record release in order to collaborate their care with an outside provider. Patient/Guardian was advised if they have not already done so to contact the registration department to sign all necessary forms in order for us  to release information regarding their care.   Consent: Patient/Guardian gives verbal consent for treatment and assignment of benefits for services provided during this visit. Patient/Guardian expressed understanding and agreed to proceed.   Follow up in 2.5 months Follow up with shot clinic Zane FORBES Bach, NP 10/02/2024, 10:59 AM

## 2024-10-05 ENCOUNTER — Telehealth (HOSPITAL_COMMUNITY): Payer: Self-pay | Admitting: Psychiatry

## 2024-10-05 NOTE — Telephone Encounter (Signed)
 Spoke with mother and passed along message that there was no where for Dr. Harl to fill anything out on the paperwork she brought earlier this week. Patient's mother would like to be informed when the paperwork is available for her to pick up.

## 2024-10-08 ENCOUNTER — Other Ambulatory Visit: Payer: Self-pay

## 2024-10-08 ENCOUNTER — Other Ambulatory Visit (HOSPITAL_COMMUNITY): Payer: Self-pay

## 2024-10-09 NOTE — Telephone Encounter (Signed)
 Provider spoke to patient's mother informed her that the [id number] form was not a form that clinical research associate could fill out.  This form is for the patient to fill out on his own and presented back to the [employer] department.  Patient's mother notes that she will pick up appointment today.  Provider endorsed understanding.  No other concerns noted at this time.

## 2024-10-15 ENCOUNTER — Other Ambulatory Visit: Payer: Self-pay

## 2024-10-22 ENCOUNTER — Ambulatory Visit: Payer: MEDICAID

## 2024-10-26 ENCOUNTER — Other Ambulatory Visit: Payer: Self-pay

## 2024-10-29 ENCOUNTER — Other Ambulatory Visit: Payer: Self-pay

## 2024-10-30 ENCOUNTER — Ambulatory Visit (INDEPENDENT_AMBULATORY_CARE_PROVIDER_SITE_OTHER): Payer: MEDICAID

## 2024-10-30 ENCOUNTER — Encounter (HOSPITAL_COMMUNITY): Payer: Self-pay

## 2024-10-30 VITALS — BP 124/92 | HR 78 | Ht 69.0 in | Wt 134.6 lb

## 2024-10-30 DIAGNOSIS — F250 Schizoaffective disorder, bipolar type: Secondary | ICD-10-CM

## 2024-10-30 NOTE — Progress Notes (Cosign Needed)
 Patient presented to office for injection of Haldol  200 mg. Patient denies SI/HI/AVH. No additional concerns noted. Patient received injection in right deltoid. Patient tolerated injection well. Patient will return in 28 days. Patient left clinic alert and ambulatory.

## 2024-11-02 ENCOUNTER — Inpatient Hospital Stay (HOSPITAL_COMMUNITY)
Admission: EM | Admit: 2024-11-02 | Discharge: 2024-11-04 | DRG: 637 | Disposition: A | Discharge status: Home / Self Care | Payer: MEDICAID | Attending: Internal Medicine | Admitting: Internal Medicine

## 2024-11-02 ENCOUNTER — Emergency Department (HOSPITAL_COMMUNITY): Payer: MEDICAID

## 2024-11-02 ENCOUNTER — Encounter (HOSPITAL_COMMUNITY): Payer: Self-pay

## 2024-11-02 ENCOUNTER — Other Ambulatory Visit: Payer: Self-pay

## 2024-11-02 DIAGNOSIS — Z1152 Encounter for screening for COVID-19: Secondary | ICD-10-CM | POA: Diagnosis not present

## 2024-11-02 DIAGNOSIS — F1410 Cocaine abuse, uncomplicated: Secondary | ICD-10-CM | POA: Diagnosis present

## 2024-11-02 DIAGNOSIS — Z79899 Other long term (current) drug therapy: Secondary | ICD-10-CM

## 2024-11-02 DIAGNOSIS — G9341 Metabolic encephalopathy: Secondary | ICD-10-CM | POA: Diagnosis present

## 2024-11-02 DIAGNOSIS — E1010 Type 1 diabetes mellitus with ketoacidosis without coma: Principal | ICD-10-CM | POA: Diagnosis present

## 2024-11-02 DIAGNOSIS — Z5901 Sheltered homelessness: Secondary | ICD-10-CM | POA: Diagnosis not present

## 2024-11-02 DIAGNOSIS — F1210 Cannabis abuse, uncomplicated: Secondary | ICD-10-CM | POA: Diagnosis present

## 2024-11-02 DIAGNOSIS — R739 Hyperglycemia, unspecified: Secondary | ICD-10-CM

## 2024-11-02 DIAGNOSIS — N179 Acute kidney failure, unspecified: Secondary | ICD-10-CM | POA: Diagnosis present

## 2024-11-02 DIAGNOSIS — E0810 Diabetes mellitus due to underlying condition with ketoacidosis without coma: Secondary | ICD-10-CM | POA: Diagnosis not present

## 2024-11-02 DIAGNOSIS — E860 Dehydration: Secondary | ICD-10-CM | POA: Diagnosis present

## 2024-11-02 DIAGNOSIS — R6511 Systemic inflammatory response syndrome (SIRS) of non-infectious origin with acute organ dysfunction: Secondary | ICD-10-CM | POA: Diagnosis present

## 2024-11-02 DIAGNOSIS — T383X6A Underdosing of insulin and oral hypoglycemic [antidiabetic] drugs, initial encounter: Secondary | ICD-10-CM | POA: Diagnosis present

## 2024-11-02 DIAGNOSIS — Z91148 Patient's other noncompliance with medication regimen for other reason: Secondary | ICD-10-CM | POA: Diagnosis not present

## 2024-11-02 DIAGNOSIS — E876 Hypokalemia: Secondary | ICD-10-CM | POA: Diagnosis present

## 2024-11-02 DIAGNOSIS — E1065 Type 1 diabetes mellitus with hyperglycemia: Secondary | ICD-10-CM | POA: Diagnosis present

## 2024-11-02 DIAGNOSIS — F17200 Nicotine dependence, unspecified, uncomplicated: Secondary | ICD-10-CM | POA: Diagnosis present

## 2024-11-02 DIAGNOSIS — Z794 Long term (current) use of insulin: Secondary | ICD-10-CM

## 2024-11-02 DIAGNOSIS — E1110 Type 2 diabetes mellitus with ketoacidosis without coma: Secondary | ICD-10-CM | POA: Diagnosis present

## 2024-11-02 DIAGNOSIS — F250 Schizoaffective disorder, bipolar type: Secondary | ICD-10-CM | POA: Diagnosis present

## 2024-11-02 LAB — COMPREHENSIVE METABOLIC PANEL WITH GFR
ALT: 52 U/L — ABNORMAL HIGH (ref 0–44)
ALT: 26 U/L (ref 0–44)
AST: 23 U/L (ref 15–41)
AST: 13 U/L — ABNORMAL LOW (ref 15–41)
Albumin: 5.7 g/dL — ABNORMAL HIGH (ref 3.5–5.0)
Albumin: 3.6 g/dL (ref 3.5–5.0)
Alkaline Phosphatase: 134 U/L — ABNORMAL HIGH (ref 38–126)
Alkaline Phosphatase: 72 U/L (ref 38–126)
Anion gap: 41 — ABNORMAL HIGH (ref 5–15)
Anion gap: 22 — ABNORMAL HIGH (ref 5–15)
BUN: 34 mg/dL — ABNORMAL HIGH (ref 6–20)
BUN: 23 mg/dL — ABNORMAL HIGH (ref 6–20)
CO2: 11 mmol/L — ABNORMAL LOW (ref 22–32)
CO2: 11 mmol/L — ABNORMAL LOW (ref 22–32)
Calcium: 10.5 mg/dL — ABNORMAL HIGH (ref 8.9–10.3)
Calcium: 6.4 mg/dL — CL (ref 8.9–10.3)
Chloride: 87 mmol/L — ABNORMAL LOW (ref 98–111)
Chloride: 114 mmol/L — ABNORMAL HIGH (ref 98–111)
Creatinine, Ser: 2.07 mg/dL — ABNORMAL HIGH (ref 0.61–1.24)
Creatinine, Ser: 1.13 mg/dL (ref 0.61–1.24)
GFR, Estimated: 43 mL/min — ABNORMAL LOW
GFR, Estimated: >60 mL/min
Glucose, Bld: 681 mg/dL (ref 70–99)
Glucose, Bld: 315 mg/dL — ABNORMAL HIGH (ref 70–99)
Potassium: 4.9 mmol/L (ref 3.5–5.1)
Potassium: 3.3 mmol/L — ABNORMAL LOW (ref 3.5–5.1)
Sodium: 139 mmol/L (ref 135–145)
Sodium: 146 mmol/L — ABNORMAL HIGH (ref 135–145)
Total Bilirubin: 0.8 mg/dL (ref 0.0–1.2)
Total Bilirubin: 0.3 mg/dL (ref 0.0–1.2)
Total Protein: 9.9 g/dL — ABNORMAL HIGH (ref 6.5–8.1)
Total Protein: 5.5 g/dL — ABNORMAL LOW (ref 6.5–8.1)

## 2024-11-02 LAB — BLOOD GAS, VENOUS
Acid-base deficit: 8.1 mmol/L — ABNORMAL HIGH (ref 0.0–2.0)
Bicarbonate: 17 mmol/L — ABNORMAL LOW (ref 20.0–28.0)
O2 Saturation: 73.9 %
Patient temperature: 37
pCO2, Ven: 33 mmHg — ABNORMAL LOW (ref 44–60)
pH, Ven: 7.32 (ref 7.25–7.43)
pO2, Ven: 43 mmHg (ref 32–45)

## 2024-11-02 LAB — BASIC METABOLIC PANEL WITH GFR
Anion gap: 22 — ABNORMAL HIGH (ref 5–15)
Anion gap: 19 — ABNORMAL HIGH (ref 5–15)
BUN: 22 mg/dL — ABNORMAL HIGH (ref 6–20)
BUN: 17 mg/dL (ref 6–20)
CO2: 19 mmol/L — ABNORMAL LOW (ref 22–32)
CO2: 20 mmol/L — ABNORMAL LOW (ref 22–32)
Calcium: 9.3 mg/dL (ref 8.9–10.3)
Calcium: 9.7 mg/dL (ref 8.9–10.3)
Chloride: 104 mmol/L (ref 98–111)
Chloride: 106 mmol/L (ref 98–111)
Creatinine, Ser: 1.38 mg/dL — ABNORMAL HIGH (ref 0.61–1.24)
Creatinine, Ser: 1.21 mg/dL (ref 0.61–1.24)
GFR, Estimated: >60 mL/min
GFR, Estimated: >60 mL/min
Glucose, Bld: 261 mg/dL — ABNORMAL HIGH (ref 70–99)
Glucose, Bld: 179 mg/dL — ABNORMAL HIGH (ref 70–99)
Potassium: 4.7 mmol/L (ref 3.5–5.1)
Potassium: 4.1 mmol/L (ref 3.5–5.1)
Sodium: 145 mmol/L (ref 135–145)
Sodium: 144 mmol/L (ref 135–145)

## 2024-11-02 LAB — CBG MONITORING, ED
Glucose-Capillary: >600 mg/dL (ref 70–99)
Glucose-Capillary: 558 mg/dL (ref 70–99)
Glucose-Capillary: >600 mg/dL (ref 70–99)
Glucose-Capillary: 485 mg/dL — ABNORMAL HIGH (ref 70–99)
Glucose-Capillary: 305 mg/dL — ABNORMAL HIGH (ref 70–99)
Glucose-Capillary: 179 mg/dL — ABNORMAL HIGH (ref 70–99)
Glucose-Capillary: 192 mg/dL — ABNORMAL HIGH (ref 70–99)
Glucose-Capillary: 241 mg/dL — ABNORMAL HIGH (ref 70–99)
Glucose-Capillary: 278 mg/dL — ABNORMAL HIGH (ref 70–99)
Glucose-Capillary: 217 mg/dL — ABNORMAL HIGH (ref 70–99)
Glucose-Capillary: 164 mg/dL — ABNORMAL HIGH (ref 70–99)

## 2024-11-02 LAB — URINALYSIS, ROUTINE W REFLEX MICROSCOPIC
Bacteria, UA: NONE SEEN
Bilirubin Urine: NEGATIVE
Glucose, UA: >=500 mg/dL — AB
Ketones, ur: 80 mg/dL — AB
Leukocytes,Ua: NEGATIVE
Nitrite: NEGATIVE
Protein, ur: 100 mg/dL — AB
Specific Gravity, Urine: 1.024 (ref 1.005–1.030)
pH: 5 (ref 5.0–8.0)

## 2024-11-02 LAB — RESP PANEL BY RT-PCR (RSV, FLU A&B, COVID)  RVPGX2
Influenza A by PCR: NEGATIVE
Influenza B by PCR: NEGATIVE
Resp Syncytial Virus by PCR: NEGATIVE
SARS Coronavirus 2 by RT PCR: NEGATIVE

## 2024-11-02 LAB — CBC WITH DIFFERENTIAL/PLATELET
Abs Immature Granulocytes: 0.12 K/uL — ABNORMAL HIGH (ref 0.00–0.07)
Basophils Absolute: 0.1 K/uL (ref 0.0–0.1)
Basophils Relative: 0 %
Eosinophils Absolute: 0 K/uL (ref 0.0–0.5)
Eosinophils Relative: 0 %
HCT: 54.6 % — ABNORMAL HIGH (ref 39.0–52.0)
Hemoglobin: 18.2 g/dL — ABNORMAL HIGH (ref 13.0–17.0)
Immature Granulocytes: 1 %
Lymphocytes Relative: 5 %
Lymphs Abs: 1.2 K/uL (ref 0.7–4.0)
MCH: 28.3 pg (ref 26.0–34.0)
MCHC: 33.3 g/dL (ref 30.0–36.0)
MCV: 84.8 fL (ref 80.0–100.0)
Monocytes Absolute: 0.9 K/uL (ref 0.1–1.0)
Monocytes Relative: 4 %
Neutro Abs: 20.5 K/uL — ABNORMAL HIGH (ref 1.7–7.7)
Neutrophils Relative %: 90 %
Platelets: 472 K/uL — ABNORMAL HIGH (ref 150–400)
RBC: 6.44 MIL/uL — ABNORMAL HIGH (ref 4.22–5.81)
RDW: 12.8 % (ref 11.5–15.5)
WBC: 22.8 K/uL — ABNORMAL HIGH (ref 4.0–10.5)
nRBC: 0 % (ref 0.0–0.2)

## 2024-11-02 LAB — URINE DRUG SCREEN
Amphetamines: NEGATIVE
Barbiturates: NEGATIVE
Benzodiazepines: NEGATIVE
Cocaine: POSITIVE — AB
Fentanyl: NEGATIVE
Methadone Scn, Ur: NEGATIVE
Opiates: NEGATIVE
Tetrahydrocannabinol: NEGATIVE

## 2024-11-02 LAB — I-STAT CHEM 8, ED
BUN: 38 mg/dL — ABNORMAL HIGH (ref 6–20)
Calcium, Ion: 1.17 mmol/L (ref 1.15–1.40)
Chloride: 98 mmol/L (ref 98–111)
Creatinine, Ser: 1.6 mg/dL — ABNORMAL HIGH (ref 0.61–1.24)
Glucose, Bld: 659 mg/dL (ref 70–99)
HCT: 57 % — ABNORMAL HIGH (ref 39.0–52.0)
Hemoglobin: 19.4 g/dL — ABNORMAL HIGH (ref 13.0–17.0)
Potassium: 4.7 mmol/L (ref 3.5–5.1)
Sodium: 139 mmol/L (ref 135–145)
TCO2: 14 mmol/L — ABNORMAL LOW (ref 22–32)

## 2024-11-02 LAB — I-STAT CG4 LACTIC ACID, ED: Lactic Acid, Venous: 4.9 mmol/L (ref 0.5–1.9)

## 2024-11-02 LAB — LACTIC ACID, PLASMA: Lactic Acid, Venous: 3.1 mmol/L (ref 0.5–1.9)

## 2024-11-02 LAB — GLUCOSE, CAPILLARY
Glucose-Capillary: 163 mg/dL — ABNORMAL HIGH (ref 70–99)
Glucose-Capillary: 174 mg/dL — ABNORMAL HIGH (ref 70–99)
Glucose-Capillary: 235 mg/dL — ABNORMAL HIGH (ref 70–99)

## 2024-11-02 LAB — BETA-HYDROXYBUTYRIC ACID
Beta-Hydroxybutyric Acid: >8 mmol/L — ABNORMAL HIGH (ref 0.05–0.27)
Beta-Hydroxybutyric Acid: 4.7 mmol/L — ABNORMAL HIGH (ref 0.05–0.27)

## 2024-11-02 LAB — ETHANOL: Alcohol, Ethyl (B): <15 mg/dL

## 2024-11-02 MED ORDER — INSULIN REGULAR(HUMAN) IN NACL 100-0.9 UT/100ML-% IV SOLN: INTRAVENOUS | Status: DC

## 2024-11-02 MED ORDER — LACTATED RINGERS IV SOLN: INTRAVENOUS | Status: DC

## 2024-11-02 MED ORDER — POTASSIUM CHLORIDE 10 MEQ/100ML IV SOLN
10.0000 meq | INTRAVENOUS | Status: AC
  Administered 2024-11-02 (×2): 10 meq via INTRAVENOUS
  Filled 2024-11-02 (×2): qty 100

## 2024-11-02 MED ORDER — SODIUM CHLORIDE 0.9 % IV SOLN
INTRAVENOUS | Status: DC
  Administered 2024-11-02: 7.5 [IU]/h via INTRAVENOUS
  Filled 2024-11-02: qty 1

## 2024-11-02 MED ORDER — HYDROXYZINE HCL 25 MG PO TABS
25.0000 mg | ORAL_TABLET | Freq: Three times a day (TID) | ORAL | Status: DC | PRN
  Administered 2024-11-03: 25 mg via ORAL
  Filled 2024-11-02: qty 1

## 2024-11-02 MED ORDER — PROCHLORPERAZINE EDISYLATE 10 MG/2ML IJ SOLN
10.0000 mg | Freq: Four times a day (QID) | INTRAMUSCULAR | Status: DC | PRN
  Administered 2024-11-02 – 2024-11-03 (×2): 10 mg via INTRAVENOUS
  Filled 2024-11-02 (×2): qty 2

## 2024-11-02 MED ORDER — LACTATED RINGERS IV BOLUS
20.0000 mL/kg | Freq: Once | INTRAVENOUS | Status: AC
  Administered 2024-11-02: 1000 mL via INTRAVENOUS

## 2024-11-02 MED ORDER — HEPARIN SODIUM (PORCINE) 5000 UNIT/ML IJ SOLN
5000.0000 [IU] | Freq: Three times a day (TID) | INTRAMUSCULAR | Status: DC
  Administered 2024-11-02 – 2024-11-04 (×6): 5000 [IU] via SUBCUTANEOUS
  Filled 2024-11-02 (×6): qty 1

## 2024-11-02 MED ORDER — ORAL CARE MOUTH RINSE: 15.0000 mL | OROMUCOSAL | Status: DC | PRN

## 2024-11-02 MED ORDER — ACETAMINOPHEN 650 MG RE SUPP: 650.0000 mg | Freq: Four times a day (QID) | RECTAL | Status: DC | PRN

## 2024-11-02 MED ORDER — DEXTROSE IN LACTATED RINGERS 5 % IV SOLN: INTRAVENOUS | Status: DC

## 2024-11-02 MED ORDER — CHLORHEXIDINE GLUCONATE CLOTH 2 % EX PADS
6.0000 | MEDICATED_PAD | Freq: Every day | CUTANEOUS | Status: DC
  Administered 2024-11-02 – 2024-11-04 (×3): 6 via TOPICAL

## 2024-11-02 MED ORDER — SODIUM CHLORIDE 0.45 % IV SOLN: INTRAVENOUS | Status: DC

## 2024-11-02 MED ORDER — ACETAMINOPHEN 325 MG PO TABS
650.0000 mg | ORAL_TABLET | Freq: Four times a day (QID) | ORAL | Status: DC | PRN
  Administered 2024-11-03: 650 mg via ORAL
  Filled 2024-11-02: qty 2

## 2024-11-02 MED ORDER — SODIUM CHLORIDE 0.9 % IV SOLN
INTRAVENOUS | Status: DC
  Filled 2024-11-02: qty 1

## 2024-11-02 MED ORDER — ALBUTEROL SULFATE (2.5 MG/3ML) 0.083% IN NEBU: 2.5000 mg | INHALATION_SOLUTION | RESPIRATORY_TRACT | Status: DC | PRN

## 2024-11-02 MED ORDER — DEXTROSE-SODIUM CHLORIDE 5-0.45 % IV SOLN: INTRAVENOUS | Status: AC

## 2024-11-02 MED ORDER — ONDANSETRON HCL 4 MG PO TABS: 4.0000 mg | ORAL_TABLET | Freq: Four times a day (QID) | ORAL | Status: DC | PRN

## 2024-11-02 MED ORDER — ONDANSETRON HCL 4 MG/2ML IJ SOLN
4.0000 mg | Freq: Four times a day (QID) | INTRAMUSCULAR | Status: DC | PRN
  Administered 2024-11-02 – 2024-11-03 (×2): 4 mg via INTRAVENOUS
  Filled 2024-11-02 (×2): qty 2

## 2024-11-02 MED ORDER — SODIUM CHLORIDE 0.9 % IV BOLUS
1000.0000 mL | Freq: Once | INTRAVENOUS | Status: AC
  Administered 2024-11-02: 1000 mL via INTRAVENOUS

## 2024-11-02 MED ORDER — DEXTROSE 50 % IV SOLN: 0.0000 mL | INTRAVENOUS | Status: DC | PRN

## 2024-11-02 MED ORDER — INSULIN REGULAR(HUMAN) IN NACL 100-0.9 UT/100ML-% IV SOLN
INTRAVENOUS | Status: DC
  Filled 2024-11-02: qty 100

## 2024-11-02 NOTE — ED Notes (Signed)
 Critical Lab results to MD  Lactic 3.1   Ca 6.4

## 2024-11-02 NOTE — ED Provider Notes (Signed)
 " [place] [facility] DE[facility] AT [facility] Provider Note   CSN: [id number] Arrival date & time: 11/02/24  1033     Patient presents with: No chief complaint on file.   Ronson Frappier is a 34 y.o. male.   Pt with hx dm/dka, mental health issues, housing instability, presents from [employer].  Family went to check on him last night, but could not get in to see him, and today pt was sent to ED via EMS with reported nausea/vomiting, generalized weakness, and lethargy.  Family member notes similar symptoms when in dka in past. Pt very limited historian, poorly responsive to questions - level 5 caveat. Not clear if patient taking meds as prescribed. No report of  trauma, fall or injury. No report of fevers.   The history is provided by the patient, a relative, a parent, medical records and the EMS personnel. The history is limited by the condition of the patient.       Prior to Admission medications  Medication Sig Start Date End Date Taking? Authorizing Provider  Blood Glucose Monitoring Suppl (BLOOD GLUCOSE MONITOR SYSTEM) w/Device KIT Use in the morning, at noon, and at bedtime. 05/28/24   Scott, Rocky SAILOR, [name]  Continuous Glucose Sensor (DEXCOM G7 SENSOR) MISC Place new sensor every 10 days. Use to monitor blood sugar continuously. 08/29/24   Trudy Mliss Dragon, MD  glucose blood (ACCU-CHEK GUIDE TEST) test strip Testing in the morning, at noon, and at bedtime. 06/27/24   [name], DO  haloperidol  decanoate (HALDOL  DECANOATE) 100 MG/ML injection Inject 1 mL (100 mg total) into the muscle every 30 (thirty) days. (Due on 03-09-24): For mood control 03/09/24   Collene Gouge I, NP  haloperidol  decanoate (HALDOL  DECANOATE) 100 MG/ML injection Inject 2 mLs (200 mg total) into the muscle every 28 (twenty-eight) days. 09/24/24   Harl Zane BRAVO, NP  hydrOXYzine  (ATARAX ) 25 MG tablet Take 1 tablet (25 mg total) by mouth 3 (three) times daily as needed for anxiety. 09/24/24    Harl Zane BRAVO, NP  ibuprofen  (ADVIL ) 200 MG tablet Take 400 mg by mouth 2 (two) times daily as needed for headache or moderate pain (pain score 4-6).    [provider]  insulin  aspart (NOVOLOG  FLEXPEN) 100 UNIT/ML FlexPen Inject 11 Units into the skin 3 (three) times daily with meals. For diabetes control 09/10/24   Elicia Sharper, DO  insulin  degludec (TRESIBA  FLEXTOUCH) 100 UNIT/ML FlexTouch Pen Inject 32 Units into the skin daily. May increase up to 35 units daily if instructed by your provider. 09/10/24   Zheng, Michael, DO  Insulin  Pen Needle 32G X 4 MM MISC Inject 1 each into the skin 3 (three) times daily. 07/25/24   Rihner, Emilie, DO  nicotine  (NICODERM CQ  - DOSED IN MG/24 HOURS) 21 mg/24hr patch Place 1 patch (21 mg total) onto the skin daily. 09/24/24   Harl Zane BRAVO, NP  propranolol  (INDERAL ) 10 MG tablet Take 1 tablet (10 mg total) by mouth 2 (two) times daily. 10/02/24   Harl Zane BRAVO, NP    Allergies: Patient has no known allergies.    Review of Systems  Unable to perform ROS: Mental status change  Constitutional:  Negative for fever.  Gastrointestinal:  Positive for nausea and vomiting.    Updated Vital Signs BP (!) 149/110 (BP Location: Left Arm)   Pulse (!) 123   Temp (!) 97.2 F (36.2 C) (Oral)   Resp 16   SpO2 100%   Physical  Exam Vitals and nursing note reviewed.  Constitutional:      Appearance: He is well-developed.     Comments: Weak/ill appearing  HENT:     Head: Atraumatic.     Nose: Nose normal.     Mouth/Throat:     Mouth: Mucous membranes are dry.     Pharynx: Oropharynx is clear.  Eyes:     General: No scleral icterus.    Conjunctiva/sclera: Conjunctivae normal.     Pupils: Pupils are equal, round, and reactive to light.  Neck:     Trachea: No tracheal deviation.     Comments: Trachea midline, thyroid  not grossly enlarged or tender. No neck stiffness or rigidity.  Cardiovascular:     Rate and Rhythm: Regular rhythm.  Tachycardia present.     Pulses: Normal pulses.     Heart sounds: Normal heart sounds. No murmur heard.    No friction rub. No gallop.  Pulmonary:     Effort: Pulmonary effort is normal. No accessory muscle usage or respiratory distress.     Breath sounds: Normal breath sounds.  Abdominal:     General: Bowel sounds are normal. There is no distension.     Palpations: Abdomen is soft.     Tenderness: There is no abdominal tenderness. There is no guarding.  Genitourinary:    Comments: No cva tenderness. Musculoskeletal:        General: No swelling or tenderness.     Cervical back: Normal range of motion and neck supple. No rigidity.     Right lower leg: No edema.     Left lower leg: No edema.  Skin:    General: Skin is warm and dry.     Findings: No rash.  Neurological:     Mental Status: He is alert.     Comments: Awake and alert appearing. Moves bil extremities purposefully.  Poorly responsive to questions.   Psychiatric:        Mood and Affect: Mood normal.     (all labs ordered are listed, but only abnormal results are displayed) Results for orders placed or performed during the hospital encounter of 11/02/24  CBG monitoring, ED   Collection Time: 11/02/24 10:56 AM  Result Value Ref Range   Glucose-Capillary >600 (HH) 70 - 99 mg/dL  Comprehensive metabolic panel   Collection Time: 11/02/24 11:04 AM  Result Value Ref Range   Sodium 139 135 - 145 mmol/L   Potassium 4.9 3.5 - 5.1 mmol/L   Chloride 87 (L) 98 - 111 mmol/L   CO2 11 (L) 22 - 32 mmol/L   Glucose, Bld 681 (HH) 70 - 99 mg/dL   BUN 34 (H) 6 - 20 mg/dL   Creatinine, Ser 7.92 (H) 0.61 - 1.24 mg/dL   Calcium  10.5 (H) 8.9 - 10.3 mg/dL   Total Protein 9.9 (H) 6.5 - 8.1 g/dL   Albumin 5.7 (H) 3.5 - 5.0 g/dL   AST 23 15 - 41 U/L   ALT 52 (H) 0 - 44 U/L   Alkaline Phosphatase 134 (H) 38 - 126 U/L   Total Bilirubin 0.8 0.0 - 1.2 mg/dL   GFR, Estimated 43 (L) >60 mL/min   Anion gap 41 (H) 5 - 15  CBC with  Differential   Collection Time: 11/02/24 11:04 AM  Result Value Ref Range   WBC 22.8 (H) 4.0 - 10.5 K/uL   RBC 6.44 (H) 4.22 - 5.81 MIL/uL   Hemoglobin 18.2 (H) 13.0 - 17.0 g/dL   HCT 45.3 (  H) 39.0 - 52.0 %   MCV 84.8 80.0 - 100.0 fL   MCH 28.3 26.0 - 34.0 pg   MCHC 33.3 30.0 - 36.0 g/dL   RDW 87.1 88.4 - 84.4 %   Platelets 472 (H) 150 - 400 K/uL   nRBC 0.0 0.0 - 0.2 %   Neutrophils Relative % 90 %   Neutro Abs 20.5 (H) 1.7 - 7.7 K/uL   Lymphocytes Relative 5 %   Lymphs Abs 1.2 0.7 - 4.0 K/uL   Monocytes Relative 4 %   Monocytes Absolute 0.9 0.1 - 1.0 K/uL   Eosinophils Relative 0 %   Eosinophils Absolute 0.0 0.0 - 0.5 K/uL   Basophils Relative 0 %   Basophils Absolute 0.1 0.0 - 0.1 K/uL   Immature Granulocytes 1 %   Abs Immature Granulocytes 0.12 (H) 0.00 - 0.07 K/uL  I-stat chem 8, ed   Collection Time: 11/02/24 11:04 AM  Result Value Ref Range   Sodium 139 135 - 145 mmol/L   Potassium 4.7 3.5 - 5.1 mmol/L   Chloride 98 98 - 111 mmol/L   BUN 38 (H) 6 - 20 mg/dL   Creatinine, Ser 8.39 (H) 0.61 - 1.24 mg/dL   Glucose, Bld 340 (HH) 70 - 99 mg/dL   Calcium , Ion 1.17 1.15 - 1.40 mmol/L   TCO2 14 (L) 22 - 32 mmol/L   Hemoglobin 19.4 (H) 13.0 - 17.0 g/dL   HCT 42.9 (H) 60.9 - 47.9 %   Comment NOTIFIED PHYSICIAN   I-Stat Lactic Acid, ED   Collection Time: 11/02/24 11:05 AM  Result Value Ref Range   Lactic Acid, Venous 4.9 (HH) 0.5 - 1.9 mmol/L   Comment NOTIFIED PHYSICIAN   CBG monitoring, ED   Collection Time: 11/02/24 11:48 AM  Result Value Ref Range   Glucose-Capillary >600 (HH) 70 - 99 mg/dL  CBG monitoring, ED   Collection Time: 11/02/24 12:26 PM  Result Value Ref Range   Glucose-Capillary 558 (HH) 70 - 99 mg/dL   Comment 1 Notify RN   CBG monitoring, ED   Collection Time: 11/02/24  1:02 PM  Result Value Ref Range   Glucose-Capillary 485 (H) 70 - 99 mg/dL      EKG: EKG Interpretation Date/Time:  Friday November 02 2024 10:51:51 EST Ventricular Rate:   121 PR Interval:  153 QRS Duration:  79 QT Interval:  344 QTC Calculation: 489 R Axis:   99  Text Interpretation: Sinus tachycardia Left ventricular hypertrophy Non-specific ST-t changes Confirmed by Bernard Drivers ([id number]) on 11/02/2024 11:39:27 AM  Radiology: No results found.   Procedures   Medications Ordered in the ED  lactated ringers  infusion (has no administration in time range)  dextrose  5 % in lactated ringers  infusion (0 mLs Intravenous Hold 11/02/24 1258)  dextrose  50 % solution 0-50 mL (has no administration in time range)  potassium chloride  10 mEq in 100 mL IVPB (10 mEq Intravenous New Bag/Given 11/02/24 1301)  insulin  regular (NOVOLIN R) 100 Units in sodium chloride  0.9 % 100 mL (1 Units/mL) infusion (7.5 Units/hr Intravenous Rate/Dose Change 11/02/24 1227)  lactated ringers  bolus 1,222 mL (1,000 mLs Intravenous New Bag/Given 11/02/24 1153)                                    Medical Decision Making Problems Addressed: Dehydration: acute illness or injury with systemic symptoms that poses a threat to life or bodily functions  Diabetic ketoacidosis without coma associated with diabetes mellitus due to underlying condition [facility]): acute illness or injury with systemic symptoms that poses a threat to life or bodily functions Hyperglycemia: acute illness or injury with systemic symptoms that poses a threat to life or bodily functions  Amount and/or Complexity of Data Reviewed Independent Historian: parent and EMS    Details: hx External Data Reviewed: notes. Labs: ordered. Decision-making details documented in ED Course. Radiology: ordered and independent interpretation performed. Decision-making details documented in ED Course. ECG/medicine tests: ordered and independent interpretation performed. Decision-making details documented in ED Course. Discussion of management or test interpretation with external provider(s): medicine  Risk Prescription drug management. Decision  regarding hospitalization.   Iv ns. Continuous pulse ox and cardiac monitoring. Labs ordered/sent. Imaging ordered.   Differential diagnosis includes  . Dispo decision including potential need for admission considered - will get labs and imaging and reassess.   Reviewed nursing notes and prior charts for additional history. External reports reviewed. Additional history from:  Cardiac monitor: sinus rhythm, rate 118.  LR bolus.   Labs reviewed/interpreted by me - glucose very high, 659, hco3 low, AG high - c/w dka. Ivf and iv insulin  per hyperglycemic crisis/dka orders. Wbc 22, hgb 18. Lactate high c/w dehydration/dka, no fevers.   Xrays reviewed/interpreted by me - no pna.   Hospitalists consulted for admission. Discussed pt - will admit.   Additional ivf.   Recheck, more alert, responsive. Recheck abd soft nt. Chest cta. Afebrile. O2 sats good.   Ivf, insulin  gtt titrated per hyperglycemic crisis orders.   CRITICAL CARE RE: DKA, severe dehydration, severe hyperglycemia Performed by: Georgenia Salim E Latrisha Coiro Total critical care time: 80 minutes Critical care time was exclusive of separately billable procedures and treating other patients. Critical care was necessary to treat or prevent imminent or life-threatening deterioration. Critical care was time spent personally by me on the following activities: development of treatment plan with patient and/or surrogate as well as nursing, discussions with consultants, evaluation of patient's response to treatment, examination of patient, obtaining history from patient or surrogate, ordering and performing treatments and interventions, ordering and review of laboratory studies, ordering and review of radiographic studies, pulse oximetry and re-evaluation of patient's condition.       Final diagnoses:  None    ED Discharge Orders     None          Bernard Drivers, MD 11/02/24 1307  "

## 2024-11-02 NOTE — H&P (Signed)
 " History and Physical  Jacob [name] FMW:[id number] DOB: 07-25-1991 DOA: 11/02/2024  PCP: Napoleon Limes, MD   Chief Complaint: Nausea, lethargy  HPI: Jacob [name] is a 34 y.o. 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  Patient is currently lethargic, and not able to provide much of a history.  Denies any evidence of recent illness, denies any abdominal pain, cough, shortness of breath, fever.  States that he is compliant with his home medications, when asked how much insulin  he takes he states enough.  Workup in the emergency department is consistent with severe hyper glycemia and related diabetic ketoacidosis.  Patient has been started on IV fluids, IV insulin  drip and hospitalist admission was requested.  Review of Systems: Please see HPI for pertinent positives and negatives. A complete 10 system review of systems are otherwise negative.  Past Medical History:  Diagnosis Date   Bipolar 1 disorder (HCC)    Diabetes mellitus without complication (HCC)    History of attempted suicide 2019   Unsuccessful suicide attempt in 2019 with rat poison   Schizoaffective disorder (HCC)    Type 1 diabetes mellitus on insulin  therapy (HCC) 2019   Vitamin D  deficiency 01/03/2024   History reviewed. No pertinent surgical history. Social History:  reports that he has been smoking. He has never used smokeless tobacco. He reports that he does not currently use alcohol. He reports that he does not currently use drugs after having used the following drugs: Marijuana.  Allergies[1]  Family History  Problem Relation Age of Onset   Healthy Mother    Healthy Father      Prior to Admission medications  Medication Sig Start Date End Date Taking? Authorizing Provider  Blood Glucose  Monitoring Suppl (BLOOD GLUCOSE MONITOR SYSTEM) w/Device KIT Use in the morning, at noon, and at bedtime. 05/28/24   Scott, Rocky SAILOR, [name]  Continuous Glucose Sensor (DEXCOM G7 SENSOR) MISC Place new sensor every 10 days. Use to monitor blood sugar continuously. 08/29/24   Trudy Mliss Dragon, MD  glucose blood (ACCU-CHEK GUIDE TEST) test strip Testing in the morning, at noon, and at bedtime. 06/27/24   [name], DO  haloperidol  decanoate (HALDOL  DECANOATE) 100 MG/ML injection Inject 1 mL (100 mg total) into the muscle every 30 (thirty) days. (Due on 03-09-24): For mood control 03/09/24   Collene Gouge I, NP  haloperidol  decanoate (HALDOL  DECANOATE) 100 MG/ML injection Inject 2 mLs (200 mg total) into the muscle every 28 (twenty-eight) days. 09/24/24   Harl Zane BRAVO, NP  hydrOXYzine  (ATARAX ) 25 MG tablet Take 1 tablet (25 mg total) by mouth 3 (three) times daily as needed for anxiety. 09/24/24   Harl Zane BRAVO, NP  ibuprofen  (ADVIL ) 200 MG tablet Take 400 mg by mouth 2 (two) times daily as needed for headache or moderate pain (pain score 4-6).    [provider]  insulin  aspart (NOVOLOG  FLEXPEN) 100 UNIT/ML FlexPen Inject 11 Units into the skin 3 (three) times daily with meals. For diabetes control 09/10/24   Elicia Sharper, DO  insulin  degludec (TRESIBA  FLEXTOUCH) 100 UNIT/ML FlexTouch Pen Inject 32 Units into the skin daily. May increase up to 35 units daily if instructed by your provider. 09/10/24   Zheng, Michael, DO  Insulin  Pen Needle 32G X 4 MM MISC Inject 1 each into the skin 3 (three)  times daily. 07/25/24   Rihner, Emilie, DO  nicotine  (NICODERM CQ  - DOSED IN MG/24 HOURS) 21 mg/24hr patch Place 1 patch (21 mg total) onto the skin daily. 09/24/24   Harl Zane BRAVO, NP  propranolol  (INDERAL ) 10 MG tablet Take 1 tablet (10 mg total) by mouth 2 (two) times daily. 10/02/24   Harl Zane BRAVO, NP    Physical Exam: BP (!) 149/110 (BP Location: Left Arm)   Pulse (!) 123    Temp (!) 97.2 F (36.2 C) (Oral)   Resp 16   SpO2 100%  General:  Alert but somewhat drowsy, oriented, calm, in no acute distress, protecting his airway and answering questions appropriately. Cardiovascular: RRR, no murmurs or rubs, no peripheral edema  Respiratory: clear to auscultation bilaterally, no wheezes, no crackles  Abdomen: soft, nontender, nondistended, normal bowel tones heard  Skin: dry, no rashes  Musculoskeletal: no joint effusions, normal range of motion  Psychiatric: appropriate affect, normal speech  Neurologic: extraocular muscles intact, clear speech, moving all extremities with intact sensorium         Labs on Admission:  Basic Metabolic Panel: Recent Labs  Lab 11/02/24 1104  NA 139  139  K 4.9  4.7  CL 87*  98  CO2 11*  GLUCOSE 681*  659*  BUN 34*  38*  CREATININE 2.07*  1.60*  CALCIUM  10.5*   Liver Function Tests: Recent Labs  Lab 11/02/24 1104  AST 23  ALT 52*  ALKPHOS 134*  BILITOT 0.8  PROT 9.9*  ALBUMIN 5.7*   No results for input(s): LIPASE, AMYLASE in the last 168 hours. No results for input(s): AMMONIA in the last 168 hours. CBC: Recent Labs  Lab 11/02/24 1104  WBC 22.8*  NEUTROABS 20.5*  HGB 18.2*  19.4*  HCT 54.6*  57.0*  MCV 84.8  PLT 472*   Cardiac Enzymes: No results for input(s): CKTOTAL, CKMB, CKMBINDEX, TROPONINI in the last 168 hours. BNP (last 3 results) No results for input(s): BNP in the last 8760 hours.  ProBNP (last 3 results) No results for input(s): PROBNP in the last 8760 hours.  CBG: Recent Labs  Lab 11/02/24 1056 11/02/24 1148 11/02/24 1226 11/02/24 1302  GLUCAP >600* >600* 558* 485*    Radiological Exams on Admission: No results found. Assessment/Plan Jacob [name] is a 34 y.o. 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  Diabetic ketoacidosis-with severe hyperglycemia, anion gap metabolic acidosis and related lactic acidosis from dehydration.  No evidence of acute infection, I suspect medication noncompliance. -Inpatient admission -Monitor closely on stepdown -Continue IV insulin  drip, and fluid resuscitation per DKA protocol -Monitor labs closely with every 4 hour BMP -Will keep n.p.o. until anion gap is closed x 2  Schizoaffective disorder-with recent inpatient hospitalization and is on monthly Haldol . -Continue home Atarax  3 times daily as needed for anxiety  Lactic acidosis-likely due to severe dehydration from his hyperglycemia and DKA. -1 L NS bolus now, and continue fluid resuscitation  Leukocytosis-appears to be typical for him to develop leukocytosis in the setting of DKA.  No signs or symptoms of acute infection.  Acute kidney injury-due to dehydration and acidosis from DKA.  Baseline normal renal function.  DVT prophylaxis: Subcutaneous heparin     Code Status: Full Code  Consults called: None  Admission status: The appropriate patient status for this  patient is INPATIENT. Inpatient status is judged to be reasonable and necessary in order to provide the required intensity of service to ensure the patient's safety. The patient's presenting symptoms, physical exam findings, and initial radiographic and laboratory data in the context of their chronic comorbidities is felt to place them at high risk for further clinical deterioration. Furthermore, it is not anticipated that the patient will be medically stable for discharge from the hospital within 2 midnights of admission.    I certify that at the point of admission it is my clinical judgment that the patient will require inpatient hospital care spanning beyond 2 midnights from the point of admission due to high intensity of service, high risk for further deterioration and high  frequency of surveillance required  Time spent: 59 minutes  Kaelah Hayashi CHRISTELLA Gail MD [facility] Pager [phone]  If 7PM-7AM, please contact night-coverage [email or web address] Password [email or web address]  11/02/2024, 1:14 PM      [1] No Known Allergies  "

## 2024-11-02 NOTE — ED Triage Notes (Signed)
 Pt BIBA from [facility] for nausea, emesis and lethargy  Hx of DM type 1,  no monitor present on exam.  Pt A&O x 3,  denies drug use  CBG 200 Tachycardic 120-130

## 2024-11-03 DIAGNOSIS — E0810 Diabetes mellitus due to underlying condition with ketoacidosis without coma: Secondary | ICD-10-CM

## 2024-11-03 LAB — BASIC METABOLIC PANEL WITH GFR
Anion gap: 18 — ABNORMAL HIGH (ref 5–15)
Anion gap: 14 (ref 5–15)
Anion gap: 13 (ref 5–15)
BUN: 16 mg/dL (ref 6–20)
BUN: 14 mg/dL (ref 6–20)
BUN: 13 mg/dL (ref 6–20)
CO2: 21 mmol/L — ABNORMAL LOW (ref 22–32)
CO2: 24 mmol/L (ref 22–32)
CO2: 26 mmol/L (ref 22–32)
Calcium: 10.1 mg/dL (ref 8.9–10.3)
Calcium: 9.7 mg/dL (ref 8.9–10.3)
Calcium: 9.6 mg/dL (ref 8.9–10.3)
Chloride: 106 mmol/L (ref 98–111)
Chloride: 106 mmol/L (ref 98–111)
Chloride: 105 mmol/L (ref 98–111)
Creatinine, Ser: 1.19 mg/dL (ref 0.61–1.24)
Creatinine, Ser: 1.08 mg/dL (ref 0.61–1.24)
Creatinine, Ser: 1.08 mg/dL (ref 0.61–1.24)
GFR, Estimated: >60 mL/min
GFR, Estimated: >60 mL/min
GFR, Estimated: >60 mL/min
Glucose, Bld: 167 mg/dL — ABNORMAL HIGH (ref 70–99)
Glucose, Bld: 156 mg/dL — ABNORMAL HIGH (ref 70–99)
Glucose, Bld: 171 mg/dL — ABNORMAL HIGH (ref 70–99)
Potassium: 4 mmol/L (ref 3.5–5.1)
Potassium: 3.8 mmol/L (ref 3.5–5.1)
Potassium: 3.7 mmol/L (ref 3.5–5.1)
Sodium: 145 mmol/L (ref 135–145)
Sodium: 144 mmol/L (ref 135–145)
Sodium: 143 mmol/L (ref 135–145)

## 2024-11-03 LAB — GLUCOSE, CAPILLARY
Glucose-Capillary: 129 mg/dL — ABNORMAL HIGH (ref 70–99)
Glucose-Capillary: 142 mg/dL — ABNORMAL HIGH (ref 70–99)
Glucose-Capillary: 156 mg/dL — ABNORMAL HIGH (ref 70–99)
Glucose-Capillary: 160 mg/dL — ABNORMAL HIGH (ref 70–99)
Glucose-Capillary: 161 mg/dL — ABNORMAL HIGH (ref 70–99)
Glucose-Capillary: 167 mg/dL — ABNORMAL HIGH (ref 70–99)
Glucose-Capillary: 169 mg/dL — ABNORMAL HIGH (ref 70–99)
Glucose-Capillary: 170 mg/dL — ABNORMAL HIGH (ref 70–99)
Glucose-Capillary: 187 mg/dL — ABNORMAL HIGH (ref 70–99)
Glucose-Capillary: 188 mg/dL — ABNORMAL HIGH (ref 70–99)
Glucose-Capillary: 205 mg/dL — ABNORMAL HIGH (ref 70–99)

## 2024-11-03 LAB — CBC
HCT: 43.2 % (ref 39.0–52.0)
Hemoglobin: 14.8 g/dL (ref 13.0–17.0)
MCH: 27.8 pg (ref 26.0–34.0)
MCHC: 34.3 g/dL (ref 30.0–36.0)
MCV: 81.2 fL (ref 80.0–100.0)
Platelets: 363 K/uL (ref 150–400)
RBC: 5.32 MIL/uL (ref 4.22–5.81)
RDW: 13 % (ref 11.5–15.5)
WBC: 17.6 K/uL — ABNORMAL HIGH (ref 4.0–10.5)
nRBC: 0 % (ref 0.0–0.2)

## 2024-11-03 LAB — BETA-HYDROXYBUTYRIC ACID
Beta-Hydroxybutyric Acid: 2.46 mmol/L — ABNORMAL HIGH (ref 0.05–0.27)
Beta-Hydroxybutyric Acid: 1.62 mmol/L — ABNORMAL HIGH (ref 0.05–0.27)

## 2024-11-03 LAB — PHOSPHORUS: Phosphorus: 2.1 mg/dL — ABNORMAL LOW (ref 2.5–4.6)

## 2024-11-03 LAB — MRSA NEXT GEN BY PCR, NASAL: MRSA by PCR Next Gen: NOT DETECTED

## 2024-11-03 LAB — MAGNESIUM: Magnesium: 2.2 mg/dL (ref 1.7–2.4)

## 2024-11-03 MED ORDER — SODIUM CHLORIDE 0.45 % IV SOLN: INTRAVENOUS | Status: DC

## 2024-11-03 MED ORDER — POTASSIUM PHOSPHATES 15 MMOLE/5ML IV SOLN
15.0000 mmol | Freq: Once | INTRAVENOUS | Status: AC
  Administered 2024-11-03: 15 mmol via INTRAVENOUS
  Filled 2024-11-03: qty 5

## 2024-11-03 MED ORDER — INSULIN ASPART 100 UNIT/ML IJ SOLN
0.0000 [IU] | Freq: Every day | INTRAMUSCULAR | Status: DC
  Administered 2024-11-03: 2 [IU] via SUBCUTANEOUS
  Filled 2024-11-03: qty 2

## 2024-11-03 MED ORDER — INSULIN GLARGINE-YFGN 100 UNIT/ML ~~LOC~~ SOLN
15.0000 [IU] | SUBCUTANEOUS | Status: DC
  Administered 2024-11-03 – 2024-11-04 (×2): 15 [IU] via SUBCUTANEOUS
  Filled 2024-11-03 (×2): qty 0.15

## 2024-11-03 MED ORDER — INSULIN ASPART 100 UNIT/ML IJ SOLN
0.0000 [IU] | Freq: Three times a day (TID) | INTRAMUSCULAR | Status: DC
  Administered 2024-11-03: 2 [IU] via SUBCUTANEOUS
  Administered 2024-11-03: 1 [IU] via SUBCUTANEOUS
  Administered 2024-11-04: 2 [IU] via SUBCUTANEOUS
  Administered 2024-11-04: 3 [IU] via SUBCUTANEOUS
  Administered 2024-11-04: 2 [IU] via SUBCUTANEOUS
  Filled 2024-11-03: qty 4
  Filled 2024-11-03: qty 2
  Filled 2024-11-03: qty 3
  Filled 2024-11-03: qty 4
  Filled 2024-11-03: qty 2

## 2024-11-03 MED ORDER — INSULIN ASPART 100 UNIT/ML IJ SOLN
4.0000 [IU] | Freq: Three times a day (TID) | INTRAMUSCULAR | Status: DC
  Administered 2024-11-03 – 2024-11-04 (×4): 4 [IU] via SUBCUTANEOUS
  Filled 2024-11-03 (×4): qty 4

## 2024-11-03 NOTE — Plan of Care (Signed)
" °  Problem: Education: Goal: Knowledge of General Education information will improve Description: Including pain rating scale, medication(s)/side effects and non-pharmacologic comfort measures Outcome: Progressing   Problem: Health Behavior/Discharge Planning: Goal: Ability to manage health-related needs will improve Outcome: Progressing   Problem: Clinical Measurements: Goal: Ability to maintain clinical measurements within normal limits will improve Outcome: Progressing Goal: Will remain free from infection Outcome: Progressing Goal: Diagnostic test results will improve Outcome: Progressing Goal: Respiratory complications will improve Outcome: Progressing Goal: Cardiovascular complication will be avoided Outcome: Progressing   Problem: Activity: Goal: Risk for activity intolerance will decrease Outcome: Progressing   Problem: Nutrition: Goal: Adequate nutrition will be maintained Outcome: Progressing   Problem: Coping: Goal: Level of anxiety will decrease Outcome: Progressing   Problem: Elimination: Goal: Will not experience complications related to bowel motility Outcome: Progressing Goal: Will not experience complications related to urinary retention Outcome: Progressing   Problem: Pain Managment: Goal: General experience of comfort will improve and/or be controlled Outcome: Progressing   Problem: Safety: Goal: Ability to remain free from injury will improve Outcome: Progressing   Problem: Skin Integrity: Goal: Risk for impaired skin integrity will decrease Outcome: Progressing   Problem: Education: Goal: Ability to describe self-care measures that may prevent or decrease complications (Diabetes Survival Skills Education) will improve Outcome: Progressing   Problem: Cardiac: Goal: Ability to maintain an adequate cardiac output will improve Outcome: Progressing   Problem: Fluid Volume: Goal: Ability to achieve a balanced intake and output will  improve Outcome: Progressing   Problem: Metabolic: Goal: Ability to maintain appropriate glucose levels will improve Outcome: Progressing   Problem: Nutritional: Goal: Maintenance of adequate weight for body size and type will improve Outcome: Progressing   Problem: Respiratory: Goal: Will regain and/or maintain adequate ventilation Outcome: Progressing   Problem: Urinary Elimination: Goal: Ability to achieve and maintain adequate renal perfusion and functioning will improve Outcome: Progressing   Cindy S. Loreli BSN, RN, CCRP, CCRN 11/03/2024 2:36 AM "

## 2024-11-03 NOTE — Progress Notes (Signed)
 " PROGRESS NOTE    Jacob [name]  FMW:[id number] DOB: 04-Jul-1991 DOA: 11/02/2024 PCP: Napoleon Limes, MD    Brief Narrative:  34 year old with history of poorly controlled type 1 diabetes on insulin , schizoaffective disorder, one of the multiple admissions for DKA where he presented with nausea, emesis and lethargy.  In the emergency room lethargic, normotensive, on room air.  Blood glucose 700 with anion gap of 41.  Started on insulin  infusion and admitted to stepdown unit. Recently admitted to behavioral unit with schizophrenia exacerbation.  Discharged on 08/10/2024  Subjective: Patient seen and examined.  Still sleepy.  He responds appropriately.  Patient denies any nausea vomiting.  Feels hungry.  Currently on 1 unit of insulin  per hour. Anion gap has been closed. Patient tells me he is using insulin  but not reliable. Patient moves around and pulls off IV lines.   Assessment & Plan:   Diabetic ketoacidosis, severe hyperglycemia: Medication noncompliance. Anion gap, closed.  Beta hydroxybutyrate 2.46. Recent known A1c more than 14. Currently on insulin  drip at the rate of 1 unit/h. Electrolytes are adequate. Patient is able to eat.  Will transition off to subcu insulin .  Starting with low-dose insulin . Start with long-acting insulin  15 units, prandial insulin  4 units.  Given sliding scale insulin .  Monitor today in the hospital.  Hypophosphatemia: Replace.  Schizoaffective disorder: On monthly Haldol .  As needed Atarax .  Continue.  Patient is currently not very open about his symptoms.  Will monitor.  AKI: Improved.  Cocaine use disorder: Denies using.  Currently stable.    DVT prophylaxis: heparin  injection 5,000 Units Start: 11/02/24 2200   Code Status: Full code Family Communication: None at the bedside Disposition Plan: Status is: Inpatient Remains inpatient appropriate because: IV insulin      Consultants:  None  Procedures:  None  Antimicrobials:   None     Objective: Vitals:   11/03/24 0600 11/03/24 0617 11/03/24 0624 11/03/24 0637  BP: (!) 180/105 (!) 161/117 (!) 167/111 (!) 168/114  Pulse: (!) 108 92 (!) 103 (!) 108  Resp: 13 10 12 18   Temp:      TempSrc:      SpO2: 100% 99% 99% 98%  Weight:      Height:        Intake/Output Summary (Last 24 hours) at 11/03/2024 0726 Last data filed at 11/03/2024 0600 Gross per 24 hour  Intake 1838.08 ml  Output --  Net 1838.08 ml   Filed Weights   11/02/24 2120  Weight: 55.6 kg    Examination:  General exam: Appears calm and comfortable.  Alert awake and interactive on interview, however goes back to sleep.  Flat affect.  Not interested on conversation. Respiratory system: Clear to auscultation. Respiratory effort normal. Cardiovascular system: S1 & S2 heard, RRR.  Gastrointestinal system: Abdomen is nondistended, soft and nontender. No organomegaly or masses felt. Normal bowel sounds heard. Central nervous system: Alert and oriented. No focal neurological deficits.   Data Reviewed: I have personally reviewed following labs and imaging studies  CBC: Recent Labs  Lab 11/02/24 1104 11/03/24 0432  WBC 22.8* 17.6*  NEUTROABS 20.5*  --   HGB 18.2*  19.4* 14.8  HCT 54.6*  57.0* 43.2  MCV 84.8 81.2  PLT 472* 363   Basic Metabolic Panel: Recent Labs  Lab 11/02/24 1323 11/02/24 1809 11/02/24 2254 11/03/24 0133 11/03/24 0432  NA 146* 145 144 145 144  K 3.3* 4.7 4.1 4.0 3.8  CL 114* 104 106 106 106  CO2 11*  19* 20* 21* 24  GLUCOSE 315* 261* 179* 167* 156*  BUN 23* 22* 17 16 14   CREATININE 1.13 1.38* 1.21 1.19 1.08  CALCIUM  6.4* 9.3 9.7 10.1 9.7   GFR: Estimated Creatinine Clearance: 77.2 mL/min (by C-G formula based on SCr of 1.08 mg/dL). Liver Function Tests: Recent Labs  Lab 11/02/24 1104 11/02/24 1323  AST 23 13*  ALT 52* 26  ALKPHOS 134* 72  BILITOT 0.8 0.3  PROT 9.9* 5.5*  ALBUMIN 5.7* 3.6   No results for input(s): LIPASE, AMYLASE in the  last 168 hours. No results for input(s): AMMONIA in the last 168 hours. Coagulation Profile: No results for input(s): INR, PROTIME in the last 168 hours. Cardiac Enzymes: No results for input(s): CKTOTAL, CKMB, CKMBINDEX, TROPONINI in the last 168 hours. BNP (last 3 results) No results for input(s): PROBNP in the last 8760 hours. HbA1C: No results for input(s): HGBA1C in the last 72 hours. CBG: Recent Labs  Lab 11/03/24 0001 11/03/24 0100 11/03/24 0206 11/03/24 0405 11/03/24 0555  GLUCAP 156* 161* 170* 167* 169*   Lipid Profile: No results for input(s): CHOL, HDL, LDLCALC, TRIG, CHOLHDL, LDLDIRECT in the last 72 hours. Thyroid  Function Tests: No results for input(s): TSH, T4TOTAL, FREET4, T3FREE, THYROIDAB in the last 72 hours. Anemia Panel: No results for input(s): VITAMINB12, FOLATE, FERRITIN, TIBC, IRON, RETICCTPCT in the last 72 hours. Sepsis Labs: Recent Labs  Lab 11/02/24 1105 11/02/24 1320  LATICACIDVEN 4.9* 3.1*    Recent Results (from the past 240 hours)  Resp panel by RT-PCR (RSV, Flu A&B, Covid) Anterior Nasal Swab     Status: None   Collection Time: 11/02/24 12:04 PM   Specimen: Anterior Nasal Swab  Result Value Ref Range Status   SARS Coronavirus 2 by RT PCR NEGATIVE NEGATIVE Final    Comment: (NOTE) SARS-CoV-2 target nucleic acids are NOT DETECTED.  The SARS-CoV-2 RNA is generally detectable in upper respiratory specimens during the acute phase of infection. The lowest concentration of SARS-CoV-2 viral copies this assay can detect is 138 copies/mL. A negative result does not preclude SARS-Cov-2 infection and should not be used as the sole basis for treatment or other patient management decisions. A negative result may occur with  improper specimen collection/handling, submission of specimen other than nasopharyngeal swab, presence of viral mutation(s) within the areas targeted by this assay, and  inadequate number of viral copies(<138 copies/mL). A negative result must be combined with clinical observations, patient history, and epidemiological information. The expected result is Negative.  Fact Sheet for Patients:  bloggercourse.com  Fact Sheet for Healthcare Providers:  seriousbroker.it  This test is no t yet approved or cleared by the [place]  FDA and  has been authorized for detection and/or diagnosis of SARS-CoV-2 by FDA under an Emergency Use Authorization (EUA). This EUA will remain  in effect (meaning this test can be used) for the duration of the COVID-19 declaration under Section 564(b)(1) of the Act, 21 U.S.C.section 360bbb-3(b)(1), unless the authorization is terminated  or revoked sooner.       Influenza A by PCR NEGATIVE NEGATIVE Final   Influenza B by PCR NEGATIVE NEGATIVE Final    Comment: (NOTE) The Xpert Xpress SARS-CoV-2/FLU/RSV plus assay is intended as an aid in the diagnosis of influenza from Nasopharyngeal swab specimens and should not be used as a sole basis for treatment. Nasal washings and aspirates are unacceptable for Xpert Xpress SARS-CoV-2/FLU/RSV testing.  Fact Sheet for Patients: bloggercourse.com  Fact Sheet for Healthcare Providers: seriousbroker.it  This test is not yet approved or cleared by the [place]  FDA and has been authorized for detection and/or diagnosis of SARS-CoV-2 by FDA under an Emergency Use Authorization (EUA). This EUA will remain in effect (meaning this test can be used) for the duration of the COVID-19 declaration under Section 564(b)(1) of the Act, 21 U.S.C. section 360bbb-3(b)(1), unless the authorization is terminated or revoked.     Resp Syncytial Virus by PCR NEGATIVE NEGATIVE Final    Comment: (NOTE) Fact Sheet for Patients: bloggercourse.com  Fact Sheet for Healthcare  Providers: seriousbroker.it  This test is not yet approved or cleared by the [place]  FDA and has been authorized for detection and/or diagnosis of SARS-CoV-2 by FDA under an Emergency Use Authorization (EUA). This EUA will remain in effect (meaning this test can be used) for the duration of the COVID-19 declaration under Section 564(b)(1) of the Act, 21 U.S.C. section 360bbb-3(b)(1), unless the authorization is terminated or revoked.  Performed at [facility], [address] [place], [place] [address]   MRSA Next Gen by PCR, Nasal     Status: None   Collection Time: 11/02/24 10:05 PM   Specimen: Nasal Mucosa; Nasal Swab  Result Value Ref Range Status   MRSA by PCR Next Gen NOT DETECTED NOT DETECTED Final    Comment: (NOTE) The GeneXpert MRSA Assay (FDA approved for NASAL specimens only), is one component of a comprehensive MRSA colonization surveillance program. It is not intended to diagnose MRSA infection nor to guide or monitor treatment for MRSA infections. Test performance is not FDA approved in patients less than 56 years old. Performed at [facility], [address], [place], [place] [address]          Radiology Studies: DG Chest Port 1 View Result Date: 11/02/2024 EXAM: 1 VIEW(S) XRAY OF THE CHEST 11/02/2024 12:26:00 PM COMPARISON: 08/02/2024 CLINICAL HISTORY: weak FINDINGS: LINES, TUBES AND DEVICES: Telemetry leads noted. LUNGS AND PLEURA: No focal pulmonary opacity. No pleural effusion. No pneumothorax. HEART AND MEDIASTINUM: No acute abnormality of the cardiac and mediastinal silhouettes. BONES AND SOFT TISSUES: No acute osseous abnormality. IMPRESSION: 1. No acute process. Electronically signed by: Waddell Calk MD 11/02/2024 01:25 PM EST RP Workstation: [id number]        Scheduled Meds:  Chlorhexidine  Gluconate Cloth  6 each Topical Daily   heparin   5,000 Units Subcutaneous Q8H    Continuous Infusions:  sodium chloride  Stopped (11/02/24 2044)   dextrose  5 % and 0.45 % NaCl 125 mL/hr at 11/03/24 0600   insulin  regular (NOVOLIN R) 100 Units in sodium chloride  0.9 % 100 mL (1 Units/mL) infusion 1.3 Units/hr (11/03/24 0600)     LOS: 1 day       Zaylei Mullane, MD [facility]   "

## 2024-11-04 ENCOUNTER — Other Ambulatory Visit: Payer: Self-pay

## 2024-11-04 DIAGNOSIS — F250 Schizoaffective disorder, bipolar type: Secondary | ICD-10-CM

## 2024-11-04 LAB — CBC WITH DIFFERENTIAL/PLATELET
Abs Immature Granulocytes: 0.04 K/uL (ref 0.00–0.07)
Basophils Absolute: 0 K/uL (ref 0.0–0.1)
Basophils Relative: 0 %
Eosinophils Absolute: 0 K/uL (ref 0.0–0.5)
Eosinophils Relative: 0 %
HCT: 39.2 % (ref 39.0–52.0)
Hemoglobin: 13.3 g/dL (ref 13.0–17.0)
Immature Granulocytes: 0 %
Lymphocytes Relative: 24 %
Lymphs Abs: 2.7 K/uL (ref 0.7–4.0)
MCH: 28.4 pg (ref 26.0–34.0)
MCHC: 33.9 g/dL (ref 30.0–36.0)
MCV: 83.8 fL (ref 80.0–100.0)
Monocytes Absolute: 0.7 K/uL (ref 0.1–1.0)
Monocytes Relative: 6 %
Neutro Abs: 7.7 K/uL (ref 1.7–7.7)
Neutrophils Relative %: 70 %
Platelets: 281 K/uL (ref 150–400)
RBC: 4.68 MIL/uL (ref 4.22–5.81)
RDW: 13 % (ref 11.5–15.5)
WBC: 11.1 K/uL — ABNORMAL HIGH (ref 4.0–10.5)
nRBC: 0 % (ref 0.0–0.2)

## 2024-11-04 LAB — BASIC METABOLIC PANEL WITH GFR
Anion gap: 10 (ref 5–15)
BUN: 11 mg/dL (ref 6–20)
CO2: 29 mmol/L (ref 22–32)
Calcium: 9.3 mg/dL (ref 8.9–10.3)
Chloride: 100 mmol/L (ref 98–111)
Creatinine, Ser: 1.13 mg/dL (ref 0.61–1.24)
GFR, Estimated: >60 mL/min
Glucose, Bld: 189 mg/dL — ABNORMAL HIGH (ref 70–99)
Potassium: 3.1 mmol/L — ABNORMAL LOW (ref 3.5–5.1)
Sodium: 138 mmol/L (ref 135–145)

## 2024-11-04 LAB — GLUCOSE, CAPILLARY
Glucose-Capillary: 185 mg/dL — ABNORMAL HIGH (ref 70–99)
Glucose-Capillary: 212 mg/dL — ABNORMAL HIGH (ref 70–99)
Glucose-Capillary: 190 mg/dL — ABNORMAL HIGH (ref 70–99)

## 2024-11-04 MED ORDER — POTASSIUM CHLORIDE 20 MEQ PO PACK
40.0000 meq | PACK | Freq: Once | ORAL | Status: AC
  Administered 2024-11-04: 40 meq via ORAL
  Filled 2024-11-04: qty 2

## 2024-11-04 NOTE — Discharge Instructions (Signed)
 [facility] [address] [place], [place] [address] Phone: [phone]

## 2024-11-04 NOTE — Progress Notes (Signed)
 " PROGRESS NOTE    Delmas Faucett  FMW:[id number] DOB: 13-Aug-1991 DOA: 11/02/2024 PCP: Napoleon Limes, MD    Brief Narrative:  34 year old with history of poorly controlled type 1 diabetes on insulin , schizoaffective disorder, one of the multiple admissions for DKA where he presented with nausea, emesis and lethargy.  In the emergency room lethargic, normotensive, on room air.  Blood glucose 700 with anion gap of 41.  Started on insulin  infusion and admitted to stepdown unit. Recently admitted to behavioral unit with schizophrenia exacerbation.  Discharged on 08/10/2024  Subjective:  Patient seen and examined. Flat affect but denies any complaints. He thinks he is back to himself. He tells me he did miss dose of insulin .  He tells me that his mental health is ok.    Assessment & Plan:   Diabetic ketoacidosis, severe hyperglycemia: Medication noncompliance. Anion gap, closed.  Beta hydroxybutyrate 2.46. Recent known A1c more than 14. Transitioned to SQ insulin .  Electrolytes are adequate.  Hypophosphatemia: Replaced.   Hypokalemia: replaced.   Schizoaffective disorder: On monthly Haldol .  As needed Atarax .  Continue.  Patient is currently not very open about his symptoms.  Will monitor. Will ask psych to assess him before discharge.   AKI: Improved.  Cocaine use disorder: Denies using.  Currently stable.    DVT prophylaxis: heparin  injection 5,000 Units Start: 11/02/24 2200   Code Status: Full code Family Communication: mother on the phone.  Disposition Plan: Status is: Inpatient Remains inpatient appropriate because: IV insulin      Consultants:  psychiatry  Procedures:  None  Antimicrobials:  None     Objective: Vitals:   11/04/24 0000 11/04/24 0315 11/04/24 0400 11/04/24 0812  BP: (!) 135/91  (!) 154/102   Pulse: 76  80   Resp: 14  16   Temp:  99.2 F (37.3 C)  98.4 F (36.9 C)  TempSrc:  Oral  Oral  SpO2: 90%  95%   Weight:      Height:         Intake/Output Summary (Last 24 hours) at 11/04/2024 0903 Last data filed at 11/04/2024 0112 Gross per 24 hour  Intake 2084.69 ml  Output 1400 ml  Net 684.69 ml   Filed Weights   11/02/24 2120  Weight: 55.6 kg    Examination:  General exam: Appears calm and comfortable.  Alert awake and interactive on interview, flat affect.  Respiratory system: Clear to auscultation. Respiratory effort normal. Cardiovascular system: S1 & S2 heard, RRR.  Gastrointestinal system: Abdomen is nondistended, soft and nontender. No organomegaly or masses felt. Normal bowel sounds heard. Central nervous system: Alert and oriented. No focal neurological deficits.   Data Reviewed: I have personally reviewed following labs and imaging studies  CBC: Recent Labs  Lab 11/02/24 1104 11/03/24 0432 11/04/24 0023  WBC 22.8* 17.6* 11.1*  NEUTROABS 20.5*  --  7.7  HGB 18.2*  19.4* 14.8 13.3  HCT 54.6*  57.0* 43.2 39.2  MCV 84.8 81.2 83.8  PLT 472* 363 281   Basic Metabolic Panel: Recent Labs  Lab 11/02/24 2254 11/03/24 0133 11/03/24 0432 11/03/24 0834 11/04/24 0023  NA 144 145 144 143 138  K 4.1 4.0 3.8 3.7 3.1*  CL 106 106 106 105 100  CO2 20* 21* 24 26 29   GLUCOSE 179* 167* 156* 171* 189*  BUN 17 16 14 13 11   CREATININE 1.21 1.19 1.08 1.08 1.13  CALCIUM  9.7 10.1 9.7 9.6 9.3  MG  --   --   --  2.2  --   PHOS  --   --   --  2.1*  --    GFR: Estimated Creatinine Clearance: 73.8 mL/min (by C-G formula based on SCr of 1.13 mg/dL). Liver Function Tests: Recent Labs  Lab 11/02/24 1104 11/02/24 1323  AST 23 13*  ALT 52* 26  ALKPHOS 134* 72  BILITOT 0.8 0.3  PROT 9.9* 5.5*  ALBUMIN 5.7* 3.6   No results for input(s): LIPASE, AMYLASE in the last 168 hours. No results for input(s): AMMONIA in the last 168 hours. Coagulation Profile: No results for input(s): INR, PROTIME in the last 168 hours. Cardiac Enzymes: No results for input(s): CKTOTAL, CKMB, CKMBINDEX,  TROPONINI in the last 168 hours. BNP (last 3 results) No results for input(s): PROBNP in the last 8760 hours. HbA1C: No results for input(s): HGBA1C in the last 72 hours. CBG: Recent Labs  Lab 11/03/24 1247 11/03/24 1723 11/03/24 1737 11/03/24 2232 11/04/24 0750  GLUCAP 188* 129* 142* 205* 185*   Lipid Profile: No results for input(s): CHOL, HDL, LDLCALC, TRIG, CHOLHDL, LDLDIRECT in the last 72 hours. Thyroid  Function Tests: No results for input(s): TSH, T4TOTAL, FREET4, T3FREE, THYROIDAB in the last 72 hours. Anemia Panel: No results for input(s): VITAMINB12, FOLATE, FERRITIN, TIBC, IRON, RETICCTPCT in the last 72 hours. Sepsis Labs: Recent Labs  Lab 11/02/24 1105 11/02/24 1320  LATICACIDVEN 4.9* 3.1*    Recent Results (from the past 240 hours)  Resp panel by RT-PCR (RSV, Flu A&B, Covid) Anterior Nasal Swab     Status: None   Collection Time: 11/02/24 12:04 PM   Specimen: Anterior Nasal Swab  Result Value Ref Range Status   SARS Coronavirus 2 by RT PCR NEGATIVE NEGATIVE Final    Comment: (NOTE) SARS-CoV-2 target nucleic acids are NOT DETECTED.  The SARS-CoV-2 RNA is generally detectable in upper respiratory specimens during the acute phase of infection. The lowest concentration of SARS-CoV-2 viral copies this assay can detect is 138 copies/mL. A negative result does not preclude SARS-Cov-2 infection and should not be used as the sole basis for treatment or other patient management decisions. A negative result may occur with  improper specimen collection/handling, submission of specimen other than nasopharyngeal swab, presence of viral mutation(s) within the areas targeted by this assay, and inadequate number of viral copies(<138 copies/mL). A negative result must be combined with clinical observations, patient history, and epidemiological information. The expected result is Negative.  Fact Sheet for Patients:   bloggercourse.com  Fact Sheet for Healthcare Providers:  seriousbroker.it  This test is no t yet approved or cleared by the [place]  FDA and  has been authorized for detection and/or diagnosis of SARS-CoV-2 by FDA under an Emergency Use Authorization (EUA). This EUA will remain  in effect (meaning this test can be used) for the duration of the COVID-19 declaration under Section 564(b)(1) of the Act, 21 U.S.C.section 360bbb-3(b)(1), unless the authorization is terminated  or revoked sooner.       Influenza A by PCR NEGATIVE NEGATIVE Final   Influenza B by PCR NEGATIVE NEGATIVE Final    Comment: (NOTE) The Xpert Xpress SARS-CoV-2/FLU/RSV plus assay is intended as an aid in the diagnosis of influenza from Nasopharyngeal swab specimens and should not be used as a sole basis for treatment. Nasal washings and aspirates are unacceptable for Xpert Xpress SARS-CoV-2/FLU/RSV testing.  Fact Sheet for Patients: bloggercourse.com  Fact Sheet for Healthcare Providers: seriousbroker.it  This test is not yet approved or cleared by the [place]  FDA  and has been authorized for detection and/or diagnosis of SARS-CoV-2 by FDA under an Emergency Use Authorization (EUA). This EUA will remain in effect (meaning this test can be used) for the duration of the COVID-19 declaration under Section 564(b)(1) of the Act, 21 U.S.C. section 360bbb-3(b)(1), unless the authorization is terminated or revoked.     Resp Syncytial Virus by PCR NEGATIVE NEGATIVE Final    Comment: (NOTE) Fact Sheet for Patients: bloggercourse.com  Fact Sheet for Healthcare Providers: seriousbroker.it  This test is not yet approved or cleared by the [place]  FDA and has been authorized for detection and/or diagnosis of SARS-CoV-2 by FDA under an Emergency Use  Authorization (EUA). This EUA will remain in effect (meaning this test can be used) for the duration of the COVID-19 declaration under Section 564(b)(1) of the Act, 21 U.S.C. section 360bbb-3(b)(1), unless the authorization is terminated or revoked.  Performed at [facility], [address], [place], [place] [address]   MRSA Next Gen by PCR, Nasal     Status: None   Collection Time: 11/02/24 10:05 PM   Specimen: Nasal Mucosa; Nasal Swab  Result Value Ref Range Status   MRSA by PCR Next Gen NOT DETECTED NOT DETECTED Final    Comment: (NOTE) The GeneXpert MRSA Assay (FDA approved for NASAL specimens only), is one component of a comprehensive MRSA colonization surveillance program. It is not intended to diagnose MRSA infection nor to guide or monitor treatment for MRSA infections. Test performance is not FDA approved in patients less than 77 years old. Performed at [facility], [address], [place], [place] [address]          Radiology Studies: DG Chest Port 1 View Result Date: 11/02/2024 EXAM: 1 VIEW(S) XRAY OF THE CHEST 11/02/2024 12:26:00 PM COMPARISON: 08/02/2024 CLINICAL HISTORY: weak FINDINGS: LINES, TUBES AND DEVICES: Telemetry leads noted. LUNGS AND PLEURA: No focal pulmonary opacity. No pleural effusion. No pneumothorax. HEART AND MEDIASTINUM: No acute abnormality of the cardiac and mediastinal silhouettes. BONES AND SOFT TISSUES: No acute osseous abnormality. IMPRESSION: 1. No acute process. Electronically signed by: Taylor Stroud MD 11/02/2024 01:25 PM EST RP Workstation: [id number]        Scheduled Meds:  Chlorhexidine  Gluconate Cloth  6 each Topical Daily   heparin   5,000 Units Subcutaneous Q8H   insulin  aspart  0-5 Units Subcutaneous QHS   insulin  aspart  0-9 Units Subcutaneous TID WC   insulin  aspart  4 Units Subcutaneous TID WC   insulin  glargine-yfgn  15 Units Subcutaneous Q24H   potassium chloride   40 mEq Oral Once    Continuous Infusions:  insulin  regular (NOVOLIN R) 100 Units in sodium chloride  0.9 % 100 mL (1 Units/mL) infusion Stopped (11/03/24 1243)     LOS: 2 days       Renato Applebaum, MD [facility]   "

## 2024-11-04 NOTE — Progress Notes (Signed)
 Pt currently residing in [facility] and is followed by [facility] ACT Team.    11/04/24 1401  TOC Brief Assessment  Insurance and Status Reviewed  Patient has primary care physician Yes  Home environment has been reviewed Living in [facility] community  Prior level of function: Independent  Prior/Current Home Services Current home services (Has an ACT Team w/ [facility])  Social Drivers of Health Review SDOH reviewed no interventions necessary  Readmission risk has been reviewed Yes  Transition of care needs no transition of care needs at this time

## 2024-11-04 NOTE — Discharge Summary (Signed)
 Physician Discharge Summary  Jacob [name] FMW:[id number] DOB: 1991/04/23 DOA: 11/02/2024  PCP: Napoleon Limes, MD  Admit date: 11/02/2024 Discharge date: 11/04/2024  Admitted From: Home Disposition: Home/patient lives in homeless boardinghouse  Recommendations for Outpatient Follow-up:  Follow up with PCP in 1-2 weeks Please obtain BMP/CBC in one week Follow-up with your psychiatrist as you are doing.   Discharge Condition: Stable CODE STATUS: Full code Diet recommendation: Low-carb diet  Discharge summary: 34 year old with history of poorly controlled type 1 diabetes on insulin , schizoaffective disorder, one of the multiple admissions for DKA where he presented with nausea, emesis and lethargy.  In the emergency room lethargic, normotensive, on room air.  Blood glucose 700 with anion gap of 41.  Started on insulin  infusion and admitted to stepdown unit.Recently admitted to behavioral unit with schizophrenia exacerbation.  Discharged on 08/10/2024.  Patient was treated with aggressive IV fluids, IV insulin  and electrolyte replacement with complete resolution of symptoms.  He is now on subcu insulin  and stabilized.  Patient has a schizoaffective disorder and also has cocaine use disorder.  Psychiatry evaluation was done.  They recommended to continue outpatient therapy with monthly Haldol  and Atarax  as needed.  Patient and family reported baseline mental health today.  Stable to discharge home.  Extensive counseling done to not to miss dose of insulin .   Discharge Diagnoses:  Active Problems:   Schizoaffective disorder, bipolar type (HCC)   DKA (diabetic ketoacidosis) (HCC)    Discharge Instructions  Discharge Instructions     Diet Carb Modified   Complete by: As directed    Increase activity slowly   Complete by: As directed       Allergies as of 11/04/2024   No Known Allergies      Medication List     TAKE these medications    Accu-Chek Guide Test test  strip Generic drug: glucose blood Testing in the morning, at noon, and at bedtime.   Blood Glucose Monitor System w/Device Kit Use in the morning, at noon, and at bedtime.   Dexcom G7 Sensor Misc Place new sensor every 10 days. Use to monitor blood sugar continuously.   haloperidol  decanoate 100 MG/ML injection Commonly known as: HALDOL  DECANOATE Inject 2 mLs (200 mg total) into the muscle every 28 (twenty-eight) days.   hydrOXYzine  25 MG tablet Commonly known as: ATARAX  Take 1 tablet (25 mg total) by mouth 3 (three) times daily as needed for anxiety.   ibuprofen  200 MG tablet Commonly known as: ADVIL  Take 400 mg by mouth 2 (two) times daily as needed for headache or moderate pain (pain score 4-6).   Insulin  Aspart FlexPen 100 UNIT/ML Commonly known as: NOVOLOG  Inject 11 Units into the skin 3 (three) times daily with meals. For diabetes control   Insupen Pen Needles 32G X 4 MM Misc Generic drug: Insulin  Pen Needle Inject 1 each into the skin 3 (three) times daily.   nicotine  21 mg/24hr patch Commonly known as: NICODERM CQ  - dosed in mg/24 hours Place 1 patch (21 mg total) onto the skin daily.   propranolol  10 MG tablet Commonly known as: INDERAL  Take 1 tablet (10 mg total) by mouth 2 (two) times daily.   Tresiba  FlexTouch 100 UNIT/ML FlexTouch Pen Generic drug: insulin  degludec Inject 32 Units into the skin daily. May increase up to 35 units daily if instructed by your provider.        Allergies[1]  Consultations: Psychiatry   Procedures/Studies: DG Chest Port 1 View Result Date: 11/02/2024 EXAM: 1  VIEW(S) XRAY OF THE CHEST 11/02/2024 12:26:00 PM COMPARISON: 08/02/2024 CLINICAL HISTORY: weak FINDINGS: LINES, TUBES AND DEVICES: Telemetry leads noted. LUNGS AND PLEURA: No focal pulmonary opacity. No pleural effusion. No pneumothorax. HEART AND MEDIASTINUM: No acute abnormality of the cardiac and mediastinal silhouettes. BONES AND SOFT TISSUES: No acute osseous  abnormality. IMPRESSION: 1. No acute process. Electronically signed by: Waddell Calk MD 11/02/2024 01:25 PM EST RP Workstation: [id number]   (Echo, Carotid, EGD, Colonoscopy, ERCP)    Subjective: Patient seen in the morning rounds.  Denied any complaints.  Reevaluated after psychiatric evaluation.  Patient and mother reported doing well and eager to go home.   Discharge Exam: Vitals:   11/04/24 1224 11/04/24 1625  BP: (!) 151/105 (!) 153/97  Pulse: 87 82  Resp: 15 16  Temp: 98 F (36.7 C) 98.4 F (36.9 C)  SpO2: 100% 100%   Vitals:   11/04/24 1040 11/04/24 1041 11/04/24 1224 11/04/24 1625  BP: (!) 144/108 (!) 143/100 (!) 151/105 (!) 153/97  Pulse: 66 79 87 82  Resp:  16 15 16   Temp: (!) 97.5 F (36.4 C) 98.4 F (36.9 C) 98 F (36.7 C) 98.4 F (36.9 C)  TempSrc: Oral Oral    SpO2: 100%  100% 100%  Weight:      Height:        General: Pt is alert, awake, not in acute distress Interactive.  Flat affect. Cardiovascular: RRR, S1/S2 +, no rubs, no gallops Respiratory: CTA bilaterally, no wheezing, no rhonchi Abdominal: Soft, NT, ND, bowel sounds + Extremities: no edema, no cyanosis    The results of significant diagnostics from this hospitalization (including imaging, microbiology, ancillary and laboratory) are listed below for reference.     Microbiology: Recent Results (from the past 240 hours)  Resp panel by RT-PCR (RSV, Flu A&B, Covid) Anterior Nasal Swab     Status: None   Collection Time: 11/02/24 12:04 PM   Specimen: Anterior Nasal Swab  Result Value Ref Range Status   SARS Coronavirus 2 by RT PCR NEGATIVE NEGATIVE Final    Comment: (NOTE) SARS-CoV-2 target nucleic acids are NOT DETECTED.  The SARS-CoV-2 RNA is generally detectable in upper respiratory specimens during the acute phase of infection. The lowest concentration of SARS-CoV-2 viral copies this assay can detect is 138 copies/mL. A negative result does not preclude SARS-Cov-2 infection and  should not be used as the sole basis for treatment or other patient management decisions. A negative result may occur with  improper specimen collection/handling, submission of specimen other than nasopharyngeal swab, presence of viral mutation(s) within the areas targeted by this assay, and inadequate number of viral copies(<138 copies/mL). A negative result must be combined with clinical observations, patient history, and epidemiological information. The expected result is Negative.  Fact Sheet for Patients:  bloggercourse.com  Fact Sheet for Healthcare Providers:  seriousbroker.it  This test is no t yet approved or cleared by the [place]  FDA and  has been authorized for detection and/or diagnosis of SARS-CoV-2 by FDA under an Emergency Use Authorization (EUA). This EUA will remain  in effect (meaning this test can be used) for the duration of the COVID-19 declaration under Section 564(b)(1) of the Act, 21 U.S.C.section 360bbb-3(b)(1), unless the authorization is terminated  or revoked sooner.       Influenza A by PCR NEGATIVE NEGATIVE Final   Influenza B by PCR NEGATIVE NEGATIVE Final    Comment: (NOTE) The Xpert Xpress SARS-CoV-2/FLU/RSV plus assay is intended as an aid in  the diagnosis of influenza from Nasopharyngeal swab specimens and should not be used as a sole basis for treatment. Nasal washings and aspirates are unacceptable for Xpert Xpress SARS-CoV-2/FLU/RSV testing.  Fact Sheet for Patients: bloggercourse.com  Fact Sheet for Healthcare Providers: seriousbroker.it  This test is not yet approved or cleared by the [place]  FDA and has been authorized for detection and/or diagnosis of SARS-CoV-2 by FDA under an Emergency Use Authorization (EUA). This EUA will remain in effect (meaning this test can be used) for the duration of the COVID-19 declaration  under Section 564(b)(1) of the Act, 21 U.S.C. section 360bbb-3(b)(1), unless the authorization is terminated or revoked.     Resp Syncytial Virus by PCR NEGATIVE NEGATIVE Final    Comment: (NOTE) Fact Sheet for Patients: bloggercourse.com  Fact Sheet for Healthcare Providers: seriousbroker.it  This test is not yet approved or cleared by the [place]  FDA and has been authorized for detection and/or diagnosis of SARS-CoV-2 by FDA under an Emergency Use Authorization (EUA). This EUA will remain in effect (meaning this test can be used) for the duration of the COVID-19 declaration under Section 564(b)(1) of the Act, 21 U.S.C. section 360bbb-3(b)(1), unless the authorization is terminated or revoked.  Performed at [facility], [address] [place], [place] [address]   MRSA Next Gen by PCR, Nasal     Status: None   Collection Time: 11/02/24 10:05 PM   Specimen: Nasal Mucosa; Nasal Swab  Result Value Ref Range Status   MRSA by PCR Next Gen NOT DETECTED NOT DETECTED Final    Comment: (NOTE) The GeneXpert MRSA Assay (FDA approved for NASAL specimens only), is one component of a comprehensive MRSA colonization surveillance program. It is not intended to diagnose MRSA infection nor to guide or monitor treatment for MRSA infections. Test performance is not FDA approved in patients less than 89 years old. Performed at [facility], [address], [place], [place] [address]      Labs: BNP (last 3 results) No results for input(s): BNP in the last 8760 hours. Basic Metabolic Panel: Recent Labs  Lab 11/02/24 2254 11/03/24 0133 11/03/24 0432 11/03/24 0834 11/04/24 0023  NA 144 145 144 143 138  K 4.1 4.0 3.8 3.7 3.1*  CL 106 106 106 105 100  CO2 20* 21* 24 26 29   GLUCOSE 179* 167* 156* 171* 189*  BUN 17 16 14 13 11   CREATININE 1.21 1.19 1.08 1.08 1.13  CALCIUM  9.7 10.1 9.7 9.6 9.3   MG  --   --   --  2.2  --   PHOS  --   --   --  2.1*  --    Liver Function Tests: Recent Labs  Lab 11/02/24 1104 11/02/24 1323  AST 23 13*  ALT 52* 26  ALKPHOS 134* 72  BILITOT 0.8 0.3  PROT 9.9* 5.5*  ALBUMIN 5.7* 3.6   No results for input(s): LIPASE, AMYLASE in the last 168 hours. No results for input(s): AMMONIA in the last 168 hours. CBC: Recent Labs  Lab 11/02/24 1104 11/03/24 0432 11/04/24 0023  WBC 22.8* 17.6* 11.1*  NEUTROABS 20.5*  --  7.7  HGB 18.2*  19.4* 14.8 13.3  HCT 54.6*  57.0* 43.2 39.2  MCV 84.8 81.2 83.8  PLT 472* 363 281   Cardiac Enzymes: No results for input(s): CKTOTAL, CKMB, CKMBINDEX, TROPONINI in the last 168 hours. BNP: Invalid input(s): POCBNP CBG: Recent Labs  Lab 11/03/24 1737 11/03/24 2232 11/04/24 0750  11/04/24 1135 11/04/24 1626  GLUCAP 142* 205* 185* 212* 190*   D-Dimer No results for input(s): DDIMER in the last 72 hours. Hgb A1c No results for input(s): HGBA1C in the last 72 hours. Lipid Profile No results for input(s): CHOL, HDL, LDLCALC, TRIG, CHOLHDL, LDLDIRECT in the last 72 hours. Thyroid  function studies No results for input(s): TSH, T4TOTAL, T3FREE, THYROIDAB in the last 72 hours.  Invalid input(s): FREET3 Anemia work up No results for input(s): VITAMINB12, FOLATE, FERRITIN, TIBC, IRON, RETICCTPCT in the last 72 hours. Urinalysis    Component Value Date/Time   COLORURINE STRAW (A) 11/02/2024 1322   APPEARANCEUR CLEAR 11/02/2024 1322   LABSPEC 1.024 11/02/2024 1322   PHURINE 5.0 11/02/2024 1322   GLUCOSEU >=500 (A) 11/02/2024 1322   HGBUR SMALL (A) 11/02/2024 1322   BILIRUBINUR NEGATIVE 11/02/2024 1322   BILIRUBINUR Negative 03/20/2024 1446   KETONESUR 80 (A) 11/02/2024 1322   PROTEINUR 100 (A) 11/02/2024 1322   UROBILINOGEN 0.2 03/20/2024 1446   NITRITE NEGATIVE 11/02/2024 1322   LEUKOCYTESUR NEGATIVE 11/02/2024 1322   Sepsis Labs Recent Labs   Lab 11/02/24 1104 11/03/24 0432 11/04/24 0023  WBC 22.8* 17.6* 11.1*   Microbiology Recent Results (from the past 240 hours)  Resp panel by RT-PCR (RSV, Flu A&B, Covid) Anterior Nasal Swab     Status: None   Collection Time: 11/02/24 12:04 PM   Specimen: Anterior Nasal Swab  Result Value Ref Range Status   SARS Coronavirus 2 by RT PCR NEGATIVE NEGATIVE Final    Comment: (NOTE) SARS-CoV-2 target nucleic acids are NOT DETECTED.  The SARS-CoV-2 RNA is generally detectable in upper respiratory specimens during the acute phase of infection. The lowest concentration of SARS-CoV-2 viral copies this assay can detect is 138 copies/mL. A negative result does not preclude SARS-Cov-2 infection and should not be used as the sole basis for treatment or other patient management decisions. A negative result may occur with  improper specimen collection/handling, submission of specimen other than nasopharyngeal swab, presence of viral mutation(s) within the areas targeted by this assay, and inadequate number of viral copies(<138 copies/mL). A negative result must be combined with clinical observations, patient history, and epidemiological information. The expected result is Negative.  Fact Sheet for Patients:  bloggercourse.com  Fact Sheet for Healthcare Providers:  seriousbroker.it  This test is no t yet approved or cleared by the [place]  FDA and  has been authorized for detection and/or diagnosis of SARS-CoV-2 by FDA under an Emergency Use Authorization (EUA). This EUA will remain  in effect (meaning this test can be used) for the duration of the COVID-19 declaration under Section 564(b)(1) of the Act, 21 U.S.C.section 360bbb-3(b)(1), unless the authorization is terminated  or revoked sooner.       Influenza A by PCR NEGATIVE NEGATIVE Final   Influenza B by PCR NEGATIVE NEGATIVE Final    Comment: (NOTE) The Xpert Xpress  SARS-CoV-2/FLU/RSV plus assay is intended as an aid in the diagnosis of influenza from Nasopharyngeal swab specimens and should not be used as a sole basis for treatment. Nasal washings and aspirates are unacceptable for Xpert Xpress SARS-CoV-2/FLU/RSV testing.  Fact Sheet for Patients: bloggercourse.com  Fact Sheet for Healthcare Providers: seriousbroker.it  This test is not yet approved or cleared by the [place]  FDA and has been authorized for detection and/or diagnosis of SARS-CoV-2 by FDA under an Emergency Use Authorization (EUA). This EUA will remain in effect (meaning this test can be used) for the duration of the COVID-19  declaration under Section 564(b)(1) of the Act, 21 U.S.C. section 360bbb-3(b)(1), unless the authorization is terminated or revoked.     Resp Syncytial Virus by PCR NEGATIVE NEGATIVE Final    Comment: (NOTE) Fact Sheet for Patients: bloggercourse.com  Fact Sheet for Healthcare Providers: seriousbroker.it  This test is not yet approved or cleared by the [place]  FDA and has been authorized for detection and/or diagnosis of SARS-CoV-2 by FDA under an Emergency Use Authorization (EUA). This EUA will remain in effect (meaning this test can be used) for the duration of the COVID-19 declaration under Section 564(b)(1) of the Act, 21 U.S.C. section 360bbb-3(b)(1), unless the authorization is terminated or revoked.  Performed at [facility], [address], [place], [place] [address]   MRSA Next Gen by PCR, Nasal     Status: None   Collection Time: 11/02/24 10:05 PM   Specimen: Nasal Mucosa; Nasal Swab  Result Value Ref Range Status   MRSA by PCR Next Gen NOT DETECTED NOT DETECTED Final    Comment: (NOTE) The GeneXpert MRSA Assay (FDA approved for NASAL specimens only), is one component of a comprehensive MRSA colonization  surveillance program. It is not intended to diagnose MRSA infection nor to guide or monitor treatment for MRSA infections. Test performance is not FDA approved in patients less than 56 years old. Performed at [facility], [address] [place], [place] [address]      Time coordinating discharge: 35 minutes  SIGNED:   Renato Applebaum, MD  [facility] 11/04/2024, 5:07 PM     [1] No Known Allergies

## 2024-11-04 NOTE — Plan of Care (Signed)
  Problem: Education: Goal: Knowledge of General Education information will improve Description: Including pain rating scale, medication(s)/side effects and non-pharmacologic comfort measures Outcome: Progressing   Problem: Activity: Goal: Risk for activity intolerance will decrease Outcome: Progressing   Problem: Nutrition: Goal: Adequate nutrition will be maintained Outcome: Progressing   Problem: Safety: Goal: Ability to remain free from injury will improve Outcome: Progressing   Problem: Pain Managment: Goal: General experience of comfort will improve and/or be controlled Outcome: Progressing

## 2024-11-04 NOTE — Consult Note (Signed)
 [facility] Psychiatric Consult Initial  Patient Name: .Jacob [name]  MRN: [id number]  DOB: 1991/08/01  Consult Order details:  Orders (From admission, onward)     Start     Ordered   11/04/24 0903  IP CONSULT TO PSYCHIATRY       Ordering Provider: Raenelle Coria, MD  Provider:  (Not yet assigned)  Question Answer Comment  Location [facility]   Reason for Consult? disorgazided schizophrenia      11/04/24 9096             Mode of Visit: In person    Psychiatry Consult Evaluation  Service Date: November 04, 2024 LOS:  LOS: 2 days  Chief Complaint Ketoacidosis  Primary Psychiatric Diagnoses  Schizoaffective disorder, bipolar type, chronic   Assessment  Jacob [name] is a 34 y.o. male admitted: Medicallyfor 11/02/2024 10:35 AM for ketoacidosis. He carries the psychiatric diagnoses of schizoaffective disorder, bipolar type and has a past medical history of diabetes.   His current presentation of psychosis in the past with hallucinations, agitation, and disorganized thought processes for years is most consistent with schizoaffective disorder, bipolar type. Current outpatient psychotropic medications include Haldol  dec and historically he has had a semi response to these medications. He was compliant with medications prior to admission as evidenced by self-report and mother's input. On initial examination, patient answered questions appropriately and denied psychosis, not responding to internal stimuli, drowsy. Please see plan below for detailed recommendations.   Diagnoses:  Active Hospital problems: Active Problems:   Schizoaffective disorder, bipolar type (HCC)   DKA (diabetic ketoacidosis) (HCC)    Plan   ## Psychiatric Medication Recommendations:  -Continue Haldol  dec monthly, last dose on 12/28 per his mother -Continue seeing his ACT team, [facility] -Recommend IOP substance abuse groups at [facility], information in discharge instructions.  ##  Medical Decision Making Capacity: Not specifically addressed in this encounter  ## Further Work-up:  -- most recent EKG on 11/03/2023 had QtC of 489 -- Pertinent labwork reviewed earlier this admission includes: CBC, chem panel, U/A, toxicology with cocaine positive, EKG   ## Disposition:-- There are no psychiatric contraindications to discharge at this time  ## Behavioral / Environmental: - No specific recommendations at this time.     ## Safety and Observation Level:  - Based on my clinical evaluation, I estimate the patient to be at low risk of self harm in the current setting. - At this time, we recommend  routine. This decision is based on my review of the chart including patient's history and current presentation, interview of the patient, mental status examination, and consideration of suicide risk including evaluating suicidal ideation, plan, intent, suicidal or self-harm behaviors, risk factors, and protective factors. This judgment is based on our ability to directly address suicide risk, implement suicide prevention strategies, and develop a safety plan while the patient is in the clinical setting. Please contact our team if there is a concern that risk level has changed.  CSSR Risk Category:C-SSRS RISK CATEGORY: No Risk  Suicide Risk Assessment: Patient has following modifiable risk factors for suicide: recent psychiatric hospitalization, which we are addressing by recommending current medication regiment with ACT team. Patient has following non-modifiable or demographic risk factors for suicide: male gender and psychiatric hospitalization Patient has the following protective factors against suicide: Access to outpatient mental health care and Supportive family  Thank you for this consult request. Recommendations have been communicated to the primary team.  We will sign off at  this time.   Sharlot Becker, NP       History of Present Illness  Relevant Aspects of [facility]  Course:  Admitted on 11/02/2024 for ketoacidosis due to diabetes.   Patient Report:  34 yo male admitted for ketoacidosis, history of schizoaffective disorder and admitted inpatient in October.  He receives care through [facility] with his last Haldol  injection on 12/28.  On assessment, he denied depression, suicidal/homicidal ideations, hallucinations, paranoia.  He did report smoking cannabis via blunts at times and nicotine  use.  Denied other substances despite being for cocaine.  When asked the reason for his Haldol , he said, mind control.  He was able to provide the address of his ACT team.  Pleasant and cooperative, drowsy towards the end of the assessment.  Psych ROS:  Depression: denied Anxiety:  denied Mania (lifetime and current): denied Psychosis: (lifetime and current): denied hallucinations and paranoia  Collateral information:  Contacted mother on 11/04/2024 who reported his last haldol  injection was on 12/28 and he is followed by an ACT team and lives in the [address].  Review of Systems  All other systems reviewed and are negative.    Psychiatric and Social History  Psychiatric History:  Information collected from patient, mother, and chart  Prev Dx/Sx: schizoaffective disorder, bipolar type; substance abuse Current Psych Provider: [facility] Home Meds (current): Haldol  dec and hydroxyzine  Previous Med Trials: multiple trials Therapy: ACT team  Prior Psych Hospitalization: multiple ones   Social History:  Occupational Hx: disabled Legal Hx: none Living Situation: [employer] Editor to weapons/lethal means: denied   Substance History Cocaine, nicotine , and cannabis abuse  Exam Findings  Physical Exam: completed by MD, reviewed. Vital Signs:  Temp:  [97.5 F (36.4 C)-100.1 F (37.8 C)] 98 F (36.7 C) (01/04 1224) Pulse Rate:  [62-95] 87 (01/04 1224) Resp:  [6-16] 15 (01/04 1224) BP: (105-157)/(60-110) 151/105 (01/04  1224) SpO2:  [90 %-100 %] 100 % (01/04 1224) Blood pressure (!) 151/105, pulse 87, temperature 98 F (36.7 C), resp. rate 15, height 5' 8 (1.727 m), weight 55.6 kg, SpO2 100%. Body mass index is 18.64 kg/m.  Physical Exam  Mental Status Exam: General Appearance: Casual  Orientation:  Full (Time, Place, and Person)  Memory:  Immediate;   Fair Recent;   Fair Remote;   Fair  Concentration:  Concentration: Fair and Attention Span: Fair  Recall:  Fair  Attention  Fair  Eye Contact:  Fair  Speech:  Clear and Coherent  Language:  Fair  Volume:  Normal  Mood: denied depression and anxiety  Affect:  Blunt  Thought Process:  Coherent  Thought Content:  WDL  Suicidal Thoughts:  No  Homicidal Thoughts:  No  Judgement:  Fair  Insight:  Fair  Psychomotor Activity:  Decreased  Akathisia:  No  Fund of Knowledge:  Fair      Assets:  Housing Leisure Time Resilience Social Support  Cognition:  WNL  ADL's:  Intact  AIMS (if indicated):        Other History   These have been pulled in through the EMR, reviewed, and updated if appropriate.  Family History:  The patient's family history includes Healthy in his father and mother.  Medical History: Past Medical History:  Diagnosis Date   Bipolar 1 disorder (HCC)    Diabetes mellitus without complication (HCC)    History of attempted suicide 2019   Unsuccessful suicide attempt in 2019 with rat poison   Schizoaffective  disorder (HCC)    Type 1 diabetes mellitus on insulin  therapy (HCC) 2019   Vitamin D  deficiency 01/03/2024    Surgical History: History reviewed. No pertinent surgical history.   Medications:  Current Medications[1]  Allergies: Allergies[2]  Sharlot Becker, NP     [1]  Current Facility-Administered Medications:    acetaminophen  (TYLENOL ) tablet 650 mg, 650 mg, Oral, Q6H PRN, 650 mg at 11/03/24 1531 **OR** acetaminophen  (TYLENOL ) suppository 650 mg, 650 mg, Rectal, Q6H PRN, Zella, Mir M, MD    albuterol  (PROVENTIL ) (2.5 MG/3ML) 0.083% nebulizer solution 2.5 mg, 2.5 mg, Nebulization, Q2H PRN, Zella, Mir M, MD   Chlorhexidine  Gluconate Cloth 2 % PADS 6 each, 6 each, Topical, Daily, Zella, Mir M, MD, 6 each at 11/04/24 1155   dextrose  50 % solution 0-50 mL, 0-50 mL, Intravenous, PRN, Steinl, Kevin, MD   heparin  injection 5,000 Units, 5,000 Units, Subcutaneous, Q8H, Zella, Mir M, MD, 5,000 Units at 11/04/24 9479   hydrOXYzine  (ATARAX ) tablet 25 mg, 25 mg, Oral, TID PRN, Ikramullah, Mir M, MD, 25 mg at 11/03/24 9365   insulin  aspart (novoLOG ) injection 0-5 Units, 0-5 Units, Subcutaneous, QHS, Ghimire, Kuber, MD, 2 Units at 11/03/24 2259   insulin  aspart (novoLOG ) injection 0-9 Units, 0-9 Units, Subcutaneous, TID WC, Ghimire, Kuber, MD, 3 Units at 11/04/24 1220   insulin  aspart (novoLOG ) injection 4 Units, 4 Units, Subcutaneous, TID WC, Ghimire, Kuber, MD, 4 Units at 11/04/24 1219   insulin  glargine-yfgn (SEMGLEE ) injection 15 Units, 15 Units, Subcutaneous, Q24H, Ghimire, Kuber, MD, 15 Units at 11/04/24 1012   insulin  regular (NOVOLIN R) 100 Units in sodium chloride  0.9 % 100 mL (1 Units/mL) infusion, , Intravenous, Continuous, Bernard Drivers, MD, Stopped at 11/03/24 1243   ondansetron  (ZOFRAN ) tablet 4 mg, 4 mg, Oral, Q6H PRN **OR** ondansetron  (ZOFRAN ) injection 4 mg, 4 mg, Intravenous, Q6H PRN, Zella, Mir M, MD, 4 mg at 11/03/24 0249   Oral care mouth rinse, 15 mL, Mouth Rinse, PRN, Zella, Mir M, MD   prochlorperazine  (COMPAZINE ) injection 10 mg, 10 mg, Intravenous, Q6H PRN, Jesus America, NP, 10 mg at 11/03/24 1150 [2] No Known Allergies

## 2024-11-04 NOTE — Progress Notes (Signed)
 Discharge package printed, letter printed and DC instructions given to pt. Verbalizes understanding.

## 2024-11-04 NOTE — Plan of Care (Signed)
  Problem: Education: Goal: Knowledge of General Education information will improve Description: Including pain rating scale, medication(s)/side effects and non-pharmacologic comfort measures Outcome: Progressing   Problem: Health Behavior/Discharge Planning: Goal: Ability to manage health-related needs will improve Outcome: Progressing   Problem: Clinical Measurements: Goal: Ability to maintain clinical measurements within normal limits will improve Outcome: Progressing Goal: Will remain free from infection Outcome: Progressing Goal: Diagnostic test results will improve Outcome: Progressing Goal: Respiratory complications will improve Outcome: Progressing Goal: Cardiovascular complication will be avoided Outcome: Progressing   Problem: Activity: Goal: Risk for activity intolerance will decrease Outcome: Progressing   Problem: Nutrition: Goal: Adequate nutrition will be maintained Outcome: Progressing   Problem: Coping: Goal: Level of anxiety will decrease Outcome: Progressing   Problem: Elimination: Goal: Will not experience complications related to bowel motility Outcome: Progressing Goal: Will not experience complications related to urinary retention Outcome: Progressing   Problem: Pain Managment: Goal: General experience of comfort will improve and/or be controlled Outcome: Progressing   Problem: Safety: Goal: Ability to remain free from injury will improve Outcome: Progressing   Problem: Skin Integrity: Goal: Risk for impaired skin integrity will decrease Outcome: Progressing   Problem: Education: Goal: Ability to describe self-care measures that may prevent or decrease complications (Diabetes Survival Skills Education) will improve Outcome: Progressing Goal: Individualized Educational Video(s) Outcome: Progressing   Problem: Cardiac: Goal: Ability to maintain an adequate cardiac output will improve Outcome: Progressing   Problem: Health  Behavior/Discharge Planning: Goal: Ability to identify and utilize available resources and services will improve Outcome: Progressing Goal: Ability to manage health-related needs will improve Outcome: Progressing   Problem: Fluid Volume: Goal: Ability to achieve a balanced intake and output will improve Outcome: Progressing   Problem: Metabolic: Goal: Ability to maintain appropriate glucose levels will improve Outcome: Progressing   Problem: Nutritional: Goal: Maintenance of adequate nutrition will improve Outcome: Progressing Goal: Maintenance of adequate weight for body size and type will improve Outcome: Progressing   Problem: Respiratory: Goal: Will regain and/or maintain adequate ventilation Outcome: Progressing   Problem: Urinary Elimination: Goal: Ability to achieve and maintain adequate renal perfusion and functioning will improve Outcome: Progressing

## 2024-11-06 ENCOUNTER — Other Ambulatory Visit: Payer: Self-pay

## 2024-11-06 ENCOUNTER — Ambulatory Visit: Payer: MEDICAID | Admitting: Dietician

## 2024-11-06 ENCOUNTER — Ambulatory Visit: Payer: MEDICAID

## 2024-11-06 VITALS — BP 137/95 | HR 87 | Temp 98.4°F | Ht 69.0 in | Wt 137.2 lb

## 2024-11-06 DIAGNOSIS — E119 Type 2 diabetes mellitus without complications: Secondary | ICD-10-CM | POA: Diagnosis not present

## 2024-11-06 DIAGNOSIS — F250 Schizoaffective disorder, bipolar type: Secondary | ICD-10-CM

## 2024-11-06 DIAGNOSIS — F17200 Nicotine dependence, unspecified, uncomplicated: Secondary | ICD-10-CM

## 2024-11-06 DIAGNOSIS — Z794 Long term (current) use of insulin: Secondary | ICD-10-CM

## 2024-11-06 DIAGNOSIS — E109 Type 1 diabetes mellitus without complications: Secondary | ICD-10-CM

## 2024-11-06 DIAGNOSIS — E1011 Type 1 diabetes mellitus with ketoacidosis with coma: Secondary | ICD-10-CM

## 2024-11-06 DIAGNOSIS — E1065 Type 1 diabetes mellitus with hyperglycemia: Secondary | ICD-10-CM

## 2024-11-06 LAB — HM DIABETES EYE EXAM

## 2024-11-06 LAB — POCT GLYCOSYLATED HEMOGLOBIN (HGB A1C): HbA1c POC (<> result, manual entry): >14 % — AB

## 2024-11-06 LAB — GLUCOSE, CAPILLARY: Glucose-Capillary: 256 mg/dL — ABNORMAL HIGH (ref 70–99)

## 2024-11-06 NOTE — Progress Notes (Addendum)
 Retinal images were done today as part of this patient's yearly diabetes health maintenance program per Dr. Napoleon. They were transmitted electronically to an ophthalmologist who will interpret them and send a report back with their findings. This was explained to the patient and they were also informed that the results would be communicated to them either by phone,  mail or both.   Diabetes Self-Management Education  Visit Type: Annual Follow-Up  Appt. Start Time: 315 Appt. End Time: 415  11/06/2024  [name], identified by name and date of birth, is a 34 y.o. male with a diagnosis of Diabetes:  .   ASSESSMENT  Estimated body mass index is 20.26 kg/m as calculated from the following:   Height as of an earlier encounter on 11/06/24: 5' 9 (1.753 m).   Weight as of an earlier encounter on 11/06/24: 137 lb 3.2 oz (62.2 kg). Wt Readings from Last 10 Encounters:  11/06/24 137 lb 3.2 oz (62.2 kg)  11/02/24 122 lb 9.2 oz (55.6 kg)  09/10/24 141 lb 6.4 oz (64.1 kg)  09/10/24 141 lb 6.4 oz (64.1 kg)  08/29/24 142 lb 6.4 oz (64.6 kg)  08/14/24 141 lb 9.6 oz (64.2 kg)  08/02/24 136 lb 14.5 oz (62.1 kg)  07/25/24 131 lb (59.4 kg)  07/14/24 131 lb (59.4 kg)  06/27/24 131 lb 12.8 oz (59.8 kg)   Lab Results  Component Value Date   HGBA1C >14.0 (A) 11/06/2024   HGBA1C >14.0 (A) 08/14/2024   HGBA1C 14.9 (H) 05/03/2024   HGBA1C 14.5 (H) 02/07/2024   HGBA1C 14.8 (H) 01/28/2024       Diabetes Self-Management Education - 11/06/24 1600       Visit Information   Visit Type Annual Follow-Up      Health Coping   How would you rate your overall health? Good      Psychosocial Assessment   Patient Belief/Attitude about Diabetes Motivated to manage diabetes    What is the hardest part about your diabetes right now, causing you the most concern, or is the most worrisome to you about your diabetes?   Checking blood sugar;Taking/obtaining medications    Self-care barriers Debilitated state  due to current medical condition;Lack of material resources    Self-management support Family   mother Jacob [name] was here today   Other persons present Parent    Patient Concerns Glycemic Control    Special Needs Simplified materials    Preferred Learning Style Hands on    Learning Readiness Ready    How often do you need to have someone help you when you read instructions, pamphlets, or other written materials from your doctor or pharmacy? 3 - Sometimes    What is the last grade level you completed in school? 12      Pre-Education Assessment   Patient understands using medications safely. Needs Review      Complications   Last HgB A1C per patient/outside source 14 %    How often do you check your blood sugar? 0 times/day (not testing)    Have you had a dilated eye exam in the past 12 months? No   retinal images done today   Have you had a dental exam in the past 12 months? No    Are you checking your feet? Yes    How many days per week are you checking your feet? 7      Dietary Intake   Breakfast grapes, oatmeal, vienna sausage    Dinner [employer] fish  sandwich    Snack (evening) grapes      Activity / Exercise   Activity / Exercise Type Light (walking / raking leaves);ADL's      Patient Education   Previous Diabetes Education Yes    Medications Reviewed patients medication for diabetes, action, purpose, timing of dose and side effects.      Individualized Goals (developed by patient)   Medications take my medication as prescribed      Patient Self-Evaluation of Goals - Patient rates self as meeting previously set goals (% of time)   Medications 50 - 75 % (half of the time)    Monitoring < 25% (hardly ever/never)      Post-Education Assessment   Patient understands using medications safely. Comphrehends key points      Outcomes   Expected Outcomes Demonstrated interest in learning. Expect positive outcomes    Future DMSE Other (comment)   1 week   Program Status Completed       Subsequent Visit   Since your last visit have you continued or begun to take your medications as prescribed? No    Since your last visit have you had your blood pressure checked? Yes    Is your most recent blood pressure lower, unchanged, or higher since your last visit? Higher    Since your last visit have you experienced any weight changes? Loss    Weight Loss (lbs) 3.8    Since your last visit, are you checking your blood glucose at least once a day? No          Individualized Plan for Diabetes Self-Management Training:   Learning Objective:  Patient will have a greater understanding of diabetes self-management. Patient education plan is to attend individual and/or group sessions per assessed needs and concerns.   Plan:   Patient Instructions  Your goal is to take the 42 units of long acting insulin  every day. And rapid acting insulin  11 units before meals  Please bring DEXCOM reader to the office next week  Please bring both insulin  pens with you.  [name] [phone] (new number)    Expected Outcomes:  Demonstrated interest in learning. Expect positive outcomes  Education material provided: Diabetes Resources  If problems or questions, patient to contact team via:  Phone  Future DSME appointment: Other (comment) (1 week) per patient request [name] Hole, RD 11/06/2024 4:38 PM.

## 2024-11-06 NOTE — Assessment & Plan Note (Signed)
 Patient is here after being hospitalized for DKA from 1/2 to 1/4. His current regimen is 32 units of Tresiba  daily and 11 units of Lantus  with meals.  He is homeless and has difficulty remembering to take his medications.  In addition to this, he is not always around his medications.  He has also not been taking his long-acting when he thinks he is not going to be eating.  He was offered education from both myself and our nutritionist today that it was okay for him to use his long-acting insulin  when he was not eating food.  He was advised to use his short acting insulin  when he does eat meals and to always use his long-acting insulin .  We did discuss statin medication at today's visit given his elevated A1c and increased cardiovascular risk factors including smoking and hyperlipidemia.  Through shared decision making, we decided that optimizing the patient's insulin  regimen was a priority and if statin therapy would get in the way of him potentially remembering to take his insulin , that we should defer statin therapy for now.  He is up-to-date on his urine microalbumin.  His A1c is above 14 today but this is likely in the setting of inconsistent insulin  use.  He reports that he has been taking his insulin  only about every other day.  He keeps his insulin  at his mom's house so that it can be refrigerated and once he needs a new batch of insulin  she brings in the refrigerated insulin  and he stores it at his palate home.  - Reeducated on CGM use and receiver use. -Continue current insulin  regimen of 32 units of Tresiba  daily and 11 units Lantus  with meals.  We will defer increasing this regimen until he is able to demonstrate that he remains uncontrolled while being compliant with his medicines. -He would benefit from frequent follow-ups with either physicians or our nutritionist at least once every month until his diabetes is well-controlled. -Provided with food from the food pantry today. - Retinal photography  to be done today -Consider statin therapy in the future

## 2024-11-06 NOTE — Patient Instructions (Signed)
 Thank you, [name], for allowing us  to provide your care today. Today we discussed . . .  > Type 1 diabetes       - Your A1c was very elevated today at above 14.  This places you at higher risk of complications like DKA and heart disease along with eye issues.  Your Tresiba  which is the long-acting insulin  is safe to take daily whether you eat any food or do not.  Please take this 32 units daily.  When you do eat please take your short acting insulin  11 units with each meal.  Follow-up in 1 month to discuss your diabetes.  We may discuss starting a cholesterol medication at some point once we have your diabetes more control. > Mental health       - Please follow-up with your psychiatrist regarding these problems.   I have ordered the following labs for you:   Lab Orders         Glucose, capillary         POC Hbg A1C       Referrals ordered today:   Referral Orders  No referral(s) requested today      Follow up: 1 month    Remember:  Should you have any questions or concerns please call the internal medicine clinic at [phone].     Melvenia Morrison, [facility]

## 2024-11-06 NOTE — Assessment & Plan Note (Signed)
 Managed by psychiatry with long-acting injectables.  He has follow-up with them at the end of this month.  He is applying for disability so he will hopefully get some income soon.

## 2024-11-06 NOTE — Assessment & Plan Note (Signed)
 We discussed tobacco cessation at today's visit.  The patient is smoking 8 to 10 cigarettes daily.  He has been smoking because people around him are smoking and does not feel he would be able to stop until he is in a different situation.  He does have nicotine  patches available and would like to use this once he is ready to start to quit smoking.  He was advised on the deleterious effects of smoking on his heart health.  Further counseling to be done at next visit.  Total of 3 minutes was spent counseling this patient today.

## 2024-11-06 NOTE — Progress Notes (Unsigned)
 "  CC: Hospital Follow Up after admission from 1/2 to 1/4 for DKA  HPI:  Jacob [name] is a 34 y.o. male with pertinent PMH of Schizoaffective disorder, bipolar type, TIDM on insulin ,  who presents for hospital follow-up after hospitalization from 1/2 to 1/4. Please see problem based assessment and plan for further history.  Patient was admitted to [facility] from January 2-4 He was treated for DKA with IV fluids and insulin   He reports poor compliance with his insulin  since discharge but does report that his condition has improved.  Complete med rec done at today's visit  This visit was performed within 2 business days and agrees to discharge.   ROS  Medications: Current Outpatient Medications  Medication Instructions   Blood Glucose Monitoring Suppl (BLOOD GLUCOSE MONITOR SYSTEM) w/Device KIT Use in the morning, at noon, and at bedtime.   Continuous Glucose Sensor (DEXCOM G7 SENSOR) MISC Place new sensor every 10 days. Use to monitor blood sugar continuously.   glucose blood (ACCU-CHEK GUIDE TEST) test strip Testing in the morning, at noon, and at bedtime.   haloperidol  decanoate (HALDOL  DECANOATE) 200 mg, Intramuscular, Every 28 days   hydrOXYzine  (ATARAX ) 25 mg, Oral, 3 times daily PRN   ibuprofen  (ADVIL ) 400 mg, Oral, 2 times daily PRN   Insulin  Aspart FlexPen (NOVOLOG ) 11 Units, Subcutaneous, 3 times daily with meals, For diabetes control   Insulin  Pen Needle 32G X 4 MM MISC 1 each, Subcutaneous, 3 times daily   nicotine  (NICODERM CQ  - DOSED IN MG/24 HOURS) 21 mg, Transdermal, Daily   propranolol  (INDERAL ) 10 mg, Oral, 2 times daily   Tresiba  FlexTouch 32 Units, Subcutaneous, Daily, May increase up to 35 units daily if instructed by your provider.     Physical Exam:  Vitals:   11/06/24 1416  BP: (!) 137/95  Pulse: 87  Temp: 98.4 F (36.9 C)  TempSrc: Oral  SpO2: 100%  Weight: 137 lb 3.2 oz (62.2 kg)  Height: 5' 9 (1.753 m)    Physical Exam Constitutional:       General: He is not in acute distress.    Appearance: He is not ill-appearing.  Cardiovascular:     Rate and Rhythm: Normal rate and regular rhythm.  Pulmonary:     Effort: No respiratory distress.  Abdominal:     General: Abdomen is flat. There is no distension.     Tenderness: There is no abdominal tenderness.  Musculoskeletal:     Right lower leg: No edema.     Left lower leg: No edema.  Neurological:     Mental Status: He is alert.       Assessment & Plan:   Assessment & Plan Uncontrolled type 1 diabetes mellitus with hyperglycemia, with long-term current use of insulin  [facility]) Patient is here after being hospitalized for DKA from 1/2 to 1/4. His current regimen is 32 units of Tresiba  daily and 11 units of Lantus  with meals.  He is homeless and has difficulty remembering to take his medications.  In addition to this, he is not always around his medications.  He has also not been taking his long-acting when he thinks he is not going to be eating.  He was offered education from both myself and our nutritionist today that it was okay for him to use his long-acting insulin  when he was not eating food.  He was advised to use his short acting insulin  when he does eat meals and to always use his long-acting insulin .  We did discuss statin medication at today's visit given his elevated A1c and increased cardiovascular risk factors including smoking and hyperlipidemia.  Through shared decision making, we decided that optimizing the patient's insulin  regimen was a priority and if statin therapy would get in the way of him potentially remembering to take his insulin , that we should defer statin therapy for now.  He is up-to-date on his urine microalbumin.  His A1c is above 14 today but this is likely in the setting of inconsistent insulin  use.  He reports that he has been taking his insulin  only about every other day.  He keeps his insulin  at his mom's house so that it can be refrigerated and once he  needs a new batch of insulin  she brings in the refrigerated insulin  and he stores it at his palate home.  - Reeducated on CGM use and receiver use. -Continue current insulin  regimen of 32 units of Tresiba  daily and 11 units Lantus  with meals.  We will defer increasing this regimen until he is able to demonstrate that he remains uncontrolled while being compliant with his medicines. -He would benefit from frequent follow-ups with either physicians or our nutritionist at least once every month until his diabetes is well-controlled. -Provided with food from the food pantry today. - Retinal photography to be done today -Consider statin therapy in the future Schizoaffective disorder, bipolar type [facility]) Managed by psychiatry with long-acting injectables.  He has follow-up with them at the end of this month.  He is applying for disability so he will hopefully get some income soon. Tobacco dependence We discussed tobacco cessation at today's visit.  The patient is smoking 8 to 10 cigarettes daily.  He has been smoking because people around him are smoking and does not feel he would be able to stop until he is in a different situation.  He does have nicotine  patches available and would like to use this once he is ready to start to quit smoking.  He was advised on the deleterious effects of smoking on his heart health.  Further counseling to be done at next visit.  Total of 3 minutes was spent counseling this patient today.  Orders Placed This Encounter  Procedures   Glucose, capillary   POC Hbg A1C   Retinal/fundus photography    Patient discussed with Dr. Mliss Foot  Follow up in 1 month Melvenia Morrison, MD [facility] Resident PGY-1 Clinic Phone: [phone] Please contact the on call pager at [phone] for any urgent or emergent needs.  "

## 2024-11-06 NOTE — Patient Instructions (Addendum)
 Your goal is to take the 42 units of long acting insulin  every day. And rapid acting insulin  11 units before meals  Please bring DEXCOM reader to the office next week  Please bring both insulin  pens with you.  [name] [phone] (new number)

## 2024-11-07 ENCOUNTER — Other Ambulatory Visit: Payer: Self-pay

## 2024-11-07 NOTE — Progress Notes (Signed)
 Internal Medicine Clinic Attending  Case discussed with the resident at the time of the visit.  We reviewed the resident's history and exam and pertinent patient test results.  I agree with the assessment, diagnosis, and plan of care documented in the resident's note.

## 2024-11-12 ENCOUNTER — Encounter: Payer: Self-pay | Admitting: Dietician

## 2024-11-12 NOTE — Progress Notes (Unsigned)
Retinal results were abstracted into patient's chart. See Ophthalmology section of Health Maintenance section of his chart for details

## 2024-11-12 NOTE — Progress Notes (Signed)
 Retinal results were abstracted into patient's chart. See Ophthalmology section of Health Maintenance section of his chart and abstraction results for details

## 2024-11-14 ENCOUNTER — Ambulatory Visit: Payer: Self-pay | Admitting: Dietician

## 2024-11-15 ENCOUNTER — Other Ambulatory Visit: Payer: Self-pay

## 2024-11-15 ENCOUNTER — Other Ambulatory Visit: Payer: Self-pay | Admitting: Dietician

## 2024-11-15 ENCOUNTER — Ambulatory Visit: Payer: MEDICAID | Admitting: Dietician

## 2024-11-15 VITALS — Wt 137.4 lb

## 2024-11-15 DIAGNOSIS — E109 Type 1 diabetes mellitus without complications: Secondary | ICD-10-CM | POA: Diagnosis not present

## 2024-11-15 DIAGNOSIS — E1065 Type 1 diabetes mellitus with hyperglycemia: Secondary | ICD-10-CM

## 2024-11-15 MED ORDER — ACCU-CHEK SOFTCLIX LANCETS MISC
12 refills | Status: AC
  Filled 2024-11-15: qty 102, fill #0
  Filled 2024-11-30: qty 100, 33d supply, fill #0

## 2024-11-15 NOTE — Telephone Encounter (Signed)
Request testing supplies for new meter

## 2024-11-15 NOTE — Patient Instructions (Signed)
 You were treated for a low blood sugar today. Ask the doctor about a prescription for Baqsimi .  r Please make an appointment with doctor for 1 week and [name] for 2 weeks.  Please try to take your Rapid acting insulin  Novolog  before eating.  Please stop drinking alcoholic beverages.  Thank you for your visit and bringing your Dexcom reader and sensor today.   Melana Hingle [phone]

## 2024-11-15 NOTE — Progress Notes (Signed)
 Diabetes Self-Management Education  Visit Type: Annual Follow-Up  Appt. Start Time: 120 Appt. End Time: 225  11/15/2024  [name], identified by name and date of birth, is a 34 y.o. male with a diagnosis of Diabetes:  SABRA Type 1  ASSESSMENT  Weight 137 lb 6.4 oz (62.3 kg). Body mass index is 20.29 kg/m.  Sample meter with 10 strips and lancets provided to patient today as a back up to his CGM. He brought a CGM sensor and stopped the current one to place the new on today in the office.  Mother verbalizing lack of continuity.   CGM Results from download:   % Time CGM active:   100 %   (Goal >70%)  Average glucose:   248 mg/dL for 10 days  Glucose management indicator:   9.3 %  Time in range (70-180 mg/dL):   25 %   (Goal >29%)  Time High (>180 mg/dL):   65 %   (Goal < 69%)     Time Low (< 69 mg/dL):   10 %   (Goal <4%)     Coefficient of variation:   51.6 %   (Goal <36%)   Hypoglycemia is concerning. Patient advised to abstain from alcohol and take insulin  as directed.      Diabetes Self-Management Education - 11/15/24 1400       Visit Information   Visit Type Annual Follow-Up      Health Coping   How would you rate your overall health? Fair   he had low blood sugar of 55 when came into office, went down to 52. then after double treatment and popcorn, it increased 185 wiht trends arrow up to the right indicating it was still rising.     Psychosocial Assessment   Other persons present Parent;Other (comment)   dietetic intern     Pre-Education Assessment   Patient understands monitoring blood glucose, interpreting and using results Needs Review   encouraged continued use and explained report     Complications   How often do you check your blood sugar? > 4 times/day    Fasting Blood glucose range (mg/dL) <29;>799   states 89% lows due to drinking alcohol   Postprandial Blood glucose range (mg/dL) >799;819-799    Number of hypoglycemic episodes per month 14    Can  you tell when your blood sugar is low? Yes    What do you do if your blood sugar is low? eats a lot    Number of hyperglycemic episodes ( >200mg /dL): Daily    Can you tell when your blood sugar is high? --   unsure; uses CGM, knows walking will bring it downl   Have you had a dilated eye exam in the past 12 months? Yes      Patient Education   Previous Diabetes Education Yes    Healthy Eating Effects of alcohol on blood glucose and safety factors with consumption of alcohol.    Medications Reviewed patients medication for diabetes, action, purpose, timing of dose and side effects.   mother asked about 3 medicines that patient stopped taking a month ago- consulted Dr. Shawn who said it was okay for him to not be taking them. they are propanolol, trazadone and benztropine .   Monitoring Taught/evaluated CGM (comment)   downloaded reader and explained report to patient and mother   Acute complications Taught prevention, symptoms, and  treatment of hypoglycemia - the 15 rule.   explained risks of alchol and how to  treat lows with 1 vs 2 treatments depending on how low his blood sugar is.     Individualized Goals (developed by patient)   Nutrition Other (comment)   stop drinking   Medications take my medication as prescribed   both insulins     Patient Self-Evaluation of Goals - Patient rates self as meeting previously set goals (% of time)   Medications 50 - 75 % (half of the time)    Monitoring >75% (most of the time)      Post-Education Assessment   Patient understands using medications safely. Comphrehends key points    Patient understands monitoring blood glucose, interpreting and using results Comprehends key points    Patient understands prevention, detection, and treatment of acute complications. Comprehends key points      Outcomes   Expected Outcomes Demonstrated limited interest in learning.  Expect minimal changes    Future DMSE Other (comment)   1-2 weeks   Program Status Not  Completed      Subsequent Visit   Since your last visit have you continued or begun to take your medications as prescribed? No   states he is taking 44 units long acting daily, but rapid acting last dose was a few days ago of 14 units, reason for not taking it more was he eats too fast.   Since your last visit have you had your blood pressure checked? No    Since your last visit have you experienced any weight changes? No change    Since your last visit, are you checking your blood glucose at least once a day? Yes   brought a sensor and his reader         Individualized Plan for Diabetes Self-Management Training:   Learning Objective:  Patient will have a greater understanding of diabetes self-management. Patient education plan is to attend individual and/or group sessions per assessed needs and concerns.   Plan:   Patient Instructions  You were treated for a low blood sugar today. Ask the doctor about a prescription for Baqsimi .  r Please make an appointment with doctor for 1 week and Jacob for 2 weeks.  Please try to take your Rapid acting insulin  Novolog  before eating.  Please stop drinking alcoholic beverages.  Thank you for your visit and bringing your Dexcom reader and sensor today.   Jacob [phone]    Expected Outcomes:  Demonstrated limited interest in learning.  Expect minimal changes  Education material provided: Diabetes Resources  If problems or questions, patient to contact team via:  Phone  Future DSME appointment: Other (comment) (1-2 weeks) Jacob [name], RD 11/15/2024 3:00 PM.

## 2024-11-22 ENCOUNTER — Observation Stay (HOSPITAL_COMMUNITY)
Admission: EM | Admit: 2024-11-22 | Discharge: 2024-11-23 | Disposition: A | Discharge status: Home / Self Care | Payer: MEDICAID | Attending: Emergency Medicine | Admitting: Emergency Medicine

## 2024-11-22 ENCOUNTER — Other Ambulatory Visit: Payer: Self-pay

## 2024-11-22 ENCOUNTER — Encounter (HOSPITAL_COMMUNITY): Payer: Self-pay

## 2024-11-22 DIAGNOSIS — F411 Generalized anxiety disorder: Secondary | ICD-10-CM | POA: Diagnosis not present

## 2024-11-22 DIAGNOSIS — Z79899 Other long term (current) drug therapy: Secondary | ICD-10-CM | POA: Insufficient documentation

## 2024-11-22 DIAGNOSIS — Z794 Long term (current) use of insulin: Secondary | ICD-10-CM | POA: Insufficient documentation

## 2024-11-22 DIAGNOSIS — F17200 Nicotine dependence, unspecified, uncomplicated: Secondary | ICD-10-CM | POA: Diagnosis not present

## 2024-11-22 DIAGNOSIS — F323 Major depressive disorder, single episode, severe with psychotic features: Secondary | ICD-10-CM | POA: Insufficient documentation

## 2024-11-22 DIAGNOSIS — E1010 Type 1 diabetes mellitus with ketoacidosis without coma: Principal | ICD-10-CM

## 2024-11-22 DIAGNOSIS — E1065 Type 1 diabetes mellitus with hyperglycemia: Secondary | ICD-10-CM

## 2024-11-22 DIAGNOSIS — E1110 Type 2 diabetes mellitus with ketoacidosis without coma: Secondary | ICD-10-CM | POA: Diagnosis present

## 2024-11-22 DIAGNOSIS — N179 Acute kidney failure, unspecified: Secondary | ICD-10-CM

## 2024-11-22 DIAGNOSIS — F250 Schizoaffective disorder, bipolar type: Secondary | ICD-10-CM | POA: Insufficient documentation

## 2024-11-22 DIAGNOSIS — R112 Nausea with vomiting, unspecified: Principal | ICD-10-CM

## 2024-11-22 DIAGNOSIS — F1990 Other psychoactive substance use, unspecified, uncomplicated: Secondary | ICD-10-CM | POA: Diagnosis present

## 2024-11-22 LAB — COMPREHENSIVE METABOLIC PANEL WITH GFR
ALT: 41 U/L (ref 0–44)
AST: 31 U/L (ref 15–41)
Albumin: 5.5 g/dL — ABNORMAL HIGH (ref 3.5–5.0)
Alkaline Phosphatase: 113 U/L (ref 38–126)
Anion gap: 32 — ABNORMAL HIGH (ref 5–15)
BUN: 24 mg/dL — ABNORMAL HIGH (ref 6–20)
CO2: 15 mmol/L — ABNORMAL LOW (ref 22–32)
Calcium: 10.7 mg/dL — ABNORMAL HIGH (ref 8.9–10.3)
Chloride: 94 mmol/L — ABNORMAL LOW (ref 98–111)
Creatinine, Ser: 1.4 mg/dL — ABNORMAL HIGH (ref 0.61–1.24)
GFR, Estimated: >60 mL/min
Glucose, Bld: 234 mg/dL — ABNORMAL HIGH (ref 70–99)
Potassium: 4.8 mmol/L (ref 3.5–5.1)
Sodium: 141 mmol/L (ref 135–145)
Total Bilirubin: 0.5 mg/dL (ref 0.0–1.2)
Total Protein: 9.6 g/dL — ABNORMAL HIGH (ref 6.5–8.1)

## 2024-11-22 LAB — BLOOD GAS, VENOUS
Acid-base deficit: 8.5 mmol/L — ABNORMAL HIGH (ref 0.0–2.0)
Bicarbonate: 18.4 mmol/L — ABNORMAL LOW (ref 20.0–28.0)
O2 Saturation: 77.7 %
Patient temperature: 37
pCO2, Ven: 42 mmHg — ABNORMAL LOW (ref 44–60)
pH, Ven: 7.25 (ref 7.25–7.43)
pO2, Ven: 49 mmHg — ABNORMAL HIGH (ref 32–45)

## 2024-11-22 LAB — BETA-HYDROXYBUTYRIC ACID: Beta-Hydroxybutyric Acid: >8 mmol/L — ABNORMAL HIGH (ref 0.05–0.27)

## 2024-11-22 LAB — CBC
HCT: 50.1 % (ref 39.0–52.0)
Hemoglobin: 16.6 g/dL (ref 13.0–17.0)
MCH: 28.3 pg (ref 26.0–34.0)
MCHC: 33.1 g/dL (ref 30.0–36.0)
MCV: 85.5 fL (ref 80.0–100.0)
Platelets: 450 K/uL — ABNORMAL HIGH (ref 150–400)
RBC: 5.86 MIL/uL — ABNORMAL HIGH (ref 4.22–5.81)
RDW: 12.6 % (ref 11.5–15.5)
WBC: 8.3 K/uL (ref 4.0–10.5)
nRBC: 0 % (ref 0.0–0.2)

## 2024-11-22 LAB — CBG MONITORING, ED
Glucose-Capillary: 202 mg/dL — ABNORMAL HIGH (ref 70–99)
Glucose-Capillary: 260 mg/dL — ABNORMAL HIGH (ref 70–99)

## 2024-11-22 MED ORDER — LACTATED RINGERS IV BOLUS
1000.0000 mL | Freq: Once | INTRAVENOUS | Status: AC
  Administered 2024-11-22: 1000 mL via INTRAVENOUS

## 2024-11-22 MED ORDER — POTASSIUM CHLORIDE 10 MEQ/100ML IV SOLN
10.0000 meq | INTRAVENOUS | Status: AC
  Administered 2024-11-22 – 2024-11-23 (×2): 10 meq via INTRAVENOUS
  Filled 2024-11-22 (×2): qty 100

## 2024-11-22 MED ORDER — FAMOTIDINE IN NACL 20-0.9 MG/50ML-% IV SOLN
20.0000 mg | Freq: Once | INTRAVENOUS | Status: AC
  Administered 2024-11-22: 20 mg via INTRAVENOUS
  Filled 2024-11-22: qty 50

## 2024-11-22 MED ORDER — INSULIN REGULAR(HUMAN) IN NACL 100-0.9 UT/100ML-% IV SOLN
INTRAVENOUS | Status: DC
  Administered 2024-11-22: 5.5 [IU]/h via INTRAVENOUS
  Filled 2024-11-22: qty 100

## 2024-11-22 MED ORDER — ONDANSETRON HCL 4 MG/2ML IJ SOLN
4.0000 mg | Freq: Once | INTRAMUSCULAR | Status: AC
  Administered 2024-11-22: 4 mg via INTRAVENOUS
  Filled 2024-11-22: qty 2

## 2024-11-22 MED ORDER — DEXTROSE 50 % IV SOLN: 0.0000 mL | INTRAVENOUS | Status: DC | PRN

## 2024-11-22 MED ORDER — DEXTROSE IN LACTATED RINGERS 5 % IV SOLN: INTRAVENOUS | Status: DC

## 2024-11-22 MED ORDER — LACTATED RINGERS IV SOLN: INTRAVENOUS | Status: DC

## 2024-11-22 NOTE — ED Notes (Signed)
Pt. Made aware for the need of urine specimen. 

## 2024-11-22 NOTE — ED Provider Notes (Signed)
 " [place] EMERGENCY DEPARTMENT AT [facility] Provider Note  CSN: [id number] Arrival date & time: 11/22/24 1933  Chief Complaint(s) Vomiting  HPI Jacob [name] is a 34 y.o. male with past medical history as below, significant for bipolar 1 disorder, IDDM, schizoaffective disorder, housing instability, GAD who presents to the ED with complaint of vomiting, DKA  Patient reports he is here for DKA.  Reports he been having nausea and vomiting since this morning.  No diarrhea.  No significant abdominal pain.  No fevers.  Emesis nonbloody nonbilious.  Denies sick contacts or recent travel.  Denies suspicious p.o. intake.  Reports his blood sugar has been high at home.  Patient is evasive when I inquired about his adherence to home insulin  regimen.    No chest pain or dyspnea, no rash, no change with bowel or bladder function.  No polydipsia  Past Medical History Past Medical History:  Diagnosis Date   Bipolar 1 disorder (HCC)    Diabetes mellitus without complication (HCC)    History of attempted suicide 2019   Unsuccessful suicide attempt in 2019 with rat poison   Schizoaffective disorder (HCC)    Type 1 diabetes mellitus on insulin  therapy (HCC) 2019   Vitamin D  deficiency 01/03/2024   Patient Active Problem List   Diagnosis Date Noted   DKA (diabetic ketoacidosis) (HCC) 11/22/2024   Oth stimulant use, unsp w stim-induce psych disorder, unsp (HCC) 08/04/2024   Substance use disorder 07/25/2024   Nonadherence to medication 05/03/2024   Vitamin D  deficiency 01/10/2024   Malingering 12/27/2023   Psychosis (HCC) 12/26/2023   Housing instability 12/17/2023   Blister of right foot 08/01/2023   Generalized anxiety disorder 08/31/2022   Tobacco dependence 08/31/2022   Schizoaffective disorder, bipolar type (HCC) 12/03/2021   Uncontrolled type 1 diabetes mellitus with hyperglycemia, with long-term current use of insulin  (HCC) 06/06/2020   Polycythemia 03/04/2020   Home  Medication(s) Prior to Admission medications  Medication Sig Start Date End Date Taking? Authorizing Provider  Accu-Chek Softclix Lancets lancets Check Blood Sugar up to three times a Day 11/15/24   Charmayne Holmes, DO  Blood Glucose Monitoring Suppl (BLOOD GLUCOSE MONITOR SYSTEM) w/Device KIT Use in the morning, at noon, and at bedtime. 05/28/24   Scott, Rocky SAILOR, [name]  Continuous Glucose Sensor (DEXCOM G7 SENSOR) MISC Place new sensor every 10 days. Use to monitor blood sugar continuously. 08/29/24   Trudy Mliss Dragon, MD  glucose blood (ACCU-CHEK GUIDE TEST) test strip Testing in the morning, at noon, and at bedtime. 06/27/24   [name], DO  haloperidol  decanoate (HALDOL  DECANOATE) 100 MG/ML injection Inject 2 mLs (200 mg total) into the muscle every 28 (twenty-eight) days. 09/24/24   Harl Zane BRAVO, NP  hydrOXYzine  (ATARAX ) 25 MG tablet Take 1 tablet (25 mg total) by mouth 3 (three) times daily as needed for anxiety. 09/24/24   Harl Zane BRAVO, NP  ibuprofen  (ADVIL ) 200 MG tablet Take 400 mg by mouth 2 (two) times daily as needed for headache or moderate pain (pain score 4-6).    [provider]  insulin  aspart (NOVOLOG  FLEXPEN) 100 UNIT/ML FlexPen Inject 11 Units into the skin 3 (three) times daily with meals. For diabetes control 09/10/24   Elicia Sharper, DO  insulin  degludec (TRESIBA  FLEXTOUCH) 100 UNIT/ML FlexTouch Pen Inject 32 Units into the skin daily. May increase up to 35 units daily if instructed by your provider. 09/10/24   Zheng, Michael, DO  Insulin  Pen Needle 32G X 4 MM  MISC Inject 1 each into the skin 3 (three) times daily. 07/25/24   Rihner, Emilie, DO  nicotine  (NICODERM CQ  - DOSED IN MG/24 HOURS) 21 mg/24hr patch Place 1 patch (21 mg total) onto the skin daily. Patient not taking: Reported on 11/03/2024 09/24/24   Harl Zane BRAVO, NP  propranolol  (INDERAL ) 10 MG tablet Take 1 tablet (10 mg total) by mouth 2 (two) times daily. 10/02/24   Harl Zane BRAVO, NP                                                                                                                                     Past Surgical History History reviewed. No pertinent surgical history. Family History Family History  Problem Relation Age of Onset   Healthy Mother    Healthy Father     Social History Social History[1] Allergies Patient has no known allergies.  Review of Systems A thorough review of systems was obtained and all systems are negative except as noted in the HPI and PMH.   Physical Exam Vital Signs  I have reviewed the triage vital signs BP (!) 146/80   Pulse 100   Temp 98.5 F (36.9 C)   Resp 16   Ht 5' 9 (1.753 m)   Wt 62.1 kg   SpO2 100%   BMI 20.23 kg/m  Physical Exam Vitals and nursing note reviewed.  Constitutional:      General: He is not in acute distress.    Appearance: Normal appearance. He is well-developed. He is not ill-appearing.  HENT:     Head: Normocephalic and atraumatic.     Right Ear: External ear normal.     Left Ear: External ear normal.     Nose: Nose normal.     Mouth/Throat:     Mouth: Mucous membranes are dry.  Eyes:     General: No scleral icterus.       Right eye: No discharge.        Left eye: No discharge.  Cardiovascular:     Rate and Rhythm: Normal rate.  Pulmonary:     Effort: Pulmonary effort is normal. No respiratory distress.     Breath sounds: No stridor.  Abdominal:     General: Abdomen is flat. There is no distension.     Palpations: Abdomen is soft.     Tenderness: There is no abdominal tenderness. There is no guarding.  Musculoskeletal:        General: No deformity.     Cervical back: No rigidity.  Skin:    General: Skin is warm and dry.     Coloration: Skin is not cyanotic, jaundiced or pale.  Neurological:     Mental Status: He is alert.  Psychiatric:        Speech: Speech normal.        Behavior: Behavior normal. Behavior is cooperative.     ED Results and  Treatments  Labs (all labs ordered are listed, but only abnormal results are displayed) Labs Reviewed  CBC - Abnormal; Notable for the following components:      Result Value   RBC 5.86 (*)    Platelets 450 (*)    All other components within normal limits  COMPREHENSIVE METABOLIC PANEL WITH GFR - Abnormal; Notable for the following components:   Chloride 94 (*)    CO2 15 (*)    Glucose, Bld 234 (*)    BUN 24 (*)    Creatinine, Ser 1.40 (*)    Calcium  10.7 (*)    Total Protein 9.6 (*)    Albumin 5.5 (*)    Anion gap 32 (*)    All other components within normal limits  BLOOD GAS, VENOUS - Abnormal; Notable for the following components:   pCO2, Ven 42 (*)    pO2, Ven 49 (*)    Bicarbonate 18.4 (*)    Acid-base deficit 8.5 (*)    All other components within normal limits  BETA-HYDROXYBUTYRIC ACID - Abnormal; Notable for the following components:   Beta-Hydroxybutyric Acid >8.00 (*)    All other components within normal limits  CBG MONITORING, ED - Abnormal; Notable for the following components:   Glucose-Capillary 202 (*)    All other components within normal limits  RESP PANEL BY RT-PCR (RSV, FLU A&B, COVID)  RVPGX2  URINALYSIS, ROUTINE W REFLEX MICROSCOPIC  URINE DRUG SCREEN  BASIC METABOLIC PANEL WITH GFR  BASIC METABOLIC PANEL WITH GFR  BETA-HYDROXYBUTYRIC ACID  BETA-HYDROXYBUTYRIC ACID                                                                                                                          Radiology No results found.  Pertinent labs & imaging results that were available during my care of the patient were reviewed by me and considered in my medical decision making (see MDM for details).  Medications Ordered in ED Medications  insulin  regular, human (MYXREDLIN ) 100 units/ 100 mL infusion (has no administration in time range)  lactated ringers  infusion (has no administration in time range)  dextrose  5 % in lactated ringers  infusion (has no administration  in time range)  dextrose  50 % solution 0-50 mL (has no administration in time range)  potassium chloride  10 mEq in 100 mL IVPB (has no administration in time range)  ondansetron  (ZOFRAN ) injection 4 mg (4 mg Intravenous Given 11/22/24 2058)  lactated ringers  bolus 1,000 mL (1,000 mLs Intravenous New Bag/Given 11/22/24 2058)  famotidine  (PEPCID ) IVPB 20 mg premix (20 mg Intravenous New Bag/Given 11/22/24 2058)  lactated ringers  bolus 1,000 mL (1,000 mLs Intravenous New Bag/Given 11/22/24 2312)  Procedures .Critical Care  Performed by: Elnor Jayson LABOR, DO Authorized by: Elnor Jayson LABOR, DO   Critical care provider statement:    Critical care time (minutes):  50   Critical care time was exclusive of:  Separately billable procedures and treating other patients   Critical care was necessary to treat or prevent imminent or life-threatening deterioration of the following conditions:  Endocrine crisis and dehydration   Critical care was time spent personally by me on the following activities:  Development of treatment plan with patient or surrogate, discussions with consultants, evaluation of patient's response to treatment, examination of patient, ordering and review of laboratory studies, ordering and review of radiographic studies, ordering and performing treatments and interventions, pulse oximetry, re-evaluation of patient's condition, review of old charts and obtaining history from patient or surrogate   Care discussed with: admitting provider     (including critical care time)  Medical Decision Making / ED Course    Medical Decision Making:    Hasnain Kassa is a 34 y.o. male with past medical history as below, significant for bipolar 1 disorder, IDDM, schizoaffective disorder, housing instability, GAD who presents to the ED with complaint of vomiting, DKA. The  complaint involves an extensive differential diagnosis and also carries with it a high risk of complications and morbidity.  Serious etiology was considered. Ddx includes but is not limited to: Differential diagnosis includes but is not exclusive to acute cholecystitis, intrathoracic causes for epigastric abdominal pain, gastritis, duodenitis, pancreatitis, small bowel or large bowel obstruction, abdominal aortic aneurysm, hernia, gastritis, hyperglycemic crisis, DKA, HHS, viral syndrome etc.   Complete initial physical exam performed, notably the patient was in no acute distress.    Reviewed and confirmed nursing documentation for past medical history, family history, social history.  Vital signs reviewed.    Nausea and vomiting> - Onset this morning, no fever, emesis nonbloody nonbilious - Abdomen is benign.  Symptoms resolved after Zofran .  No further nausea.  No abdominal pain. - AKI, elev BUN, favor pre-renal in setting of N/V. Continue fluids  DKA type I> - Insulin -dependent diabetic, type I, poor compliance with home medications.  He also has underlying psychiatric derangements that complicate his medical history - pH 7.25, bicarb is 15, beta hydroxy is greater than 8.  AG32.  Patient does not want to provide a urine sample. - Concern for recurrent DKA.  Start Endo tool.  Will admit hospitalist  Clinical Course as of 11/22/24 2333  Thu Nov 22, 2024  2130 Creatinine(!): 1.40 Mildly worsened from prior [SG]  2130 CO2(!): 15 Worsened from prior [SG]    Clinical Course User Index [SG] Elnor Jayson LABOR, DO     Admit [facility], DKA, AKI               Additional history obtained: -Additional history obtained from na -External records from outside source obtained and reviewed including: Chart review including previous notes, labs, imaging, consultation notes including  Recent admission, home medications   Lab Tests: -I ordered, reviewed, and interpreted labs.   The  pertinent results include:   Labs Reviewed  CBC - Abnormal; Notable for the following components:      Result Value   RBC 5.86 (*)    Platelets 450 (*)    All other components within normal limits  COMPREHENSIVE METABOLIC PANEL WITH GFR - Abnormal; Notable for the following components:   Chloride 94 (*)    CO2 15 (*)    Glucose, Bld 234 (*)  BUN 24 (*)    Creatinine, Ser 1.40 (*)    Calcium  10.7 (*)    Total Protein 9.6 (*)    Albumin 5.5 (*)    Anion gap 32 (*)    All other components within normal limits  BLOOD GAS, VENOUS - Abnormal; Notable for the following components:   pCO2, Ven 42 (*)    pO2, Ven 49 (*)    Bicarbonate 18.4 (*)    Acid-base deficit 8.5 (*)    All other components within normal limits  BETA-HYDROXYBUTYRIC ACID - Abnormal; Notable for the following components:   Beta-Hydroxybutyric Acid >8.00 (*)    All other components within normal limits  CBG MONITORING, ED - Abnormal; Notable for the following components:   Glucose-Capillary 202 (*)    All other components within normal limits  RESP PANEL BY RT-PCR (RSV, FLU A&B, COVID)  RVPGX2  URINALYSIS, ROUTINE W REFLEX MICROSCOPIC  URINE DRUG SCREEN  BASIC METABOLIC PANEL WITH GFR  BASIC METABOLIC PANEL WITH GFR  BETA-HYDROXYBUTYRIC ACID  BETA-HYDROXYBUTYRIC ACID    Notable for as above  EKG   EKG Interpretation Date/Time:    Ventricular Rate:    PR Interval:    QRS Duration:    QT Interval:    QTC Calculation:   R Axis:      Text Interpretation:           Imaging Studies ordered: na   Medicines ordered and prescription drug management: Meds ordered this encounter  Medications   ondansetron  (ZOFRAN ) injection 4 mg   lactated ringers  bolus 1,000 mL   famotidine  (PEPCID ) IVPB 20 mg premix   lactated ringers  bolus 1,000 mL   insulin  regular, human (MYXREDLIN ) 100 units/ 100 mL infusion    EndoTool Goal Range::   140-180    Type of Diabetes:   Type 1    Mode of Therapy:   ENDOX1 for  DKA    Start Method:   EndoTool to calculate   lactated ringers  infusion   dextrose  5 % in lactated ringers  infusion   dextrose  50 % solution 0-50 mL   potassium chloride  10 mEq in 100 mL IVPB    -I have reviewed the patients home medicines and have made adjustments as needed   Consultations Obtained: I requested consultation with the [facility],  and discussed lab and imaging findings as well as pertinent plan    Cardiac Monitoring: Continuous pulse oximetry interpreted by myself, 100% on RA.    Social Determinants of Health:  Diagnosis or treatment significantly limited by social determinants of health: current smoker and polysubstance abuse   Reevaluation: After the interventions noted above, I reevaluated the patient and found that they have improved  Co morbidities that complicate the patient evaluation  Past Medical History:  Diagnosis Date   Bipolar 1 disorder (HCC)    Diabetes mellitus without complication (HCC)    History of attempted suicide 2019   Unsuccessful suicide attempt in 2019 with rat poison   Schizoaffective disorder (HCC)    Type 1 diabetes mellitus on insulin  therapy (HCC) 2019   Vitamin D  deficiency 01/03/2024      Dispostion: Disposition decision including need for hospitalization was considered, and patient admitted to the hospital.    Final Clinical Impression(s) / ED Diagnoses Final diagnoses:  Nausea and vomiting in adult  Diabetic ketoacidosis without coma associated with type 1 diabetes mellitus (HCC)  AKI (acute kidney injury)         [1]  Social History  Tobacco Use   Smoking status: Every Day   Smokeless tobacco: Never   Tobacco comments:    States he is trying to quit  Vaping Use   Vaping status: Every Day  Substance Use Topics   Alcohol use: Not Currently    Comment: social/occassional   Drug use: Not Currently    Types: Marijuana     Elnor Jayson LABOR, DO 11/22/24 2333  "

## 2024-11-22 NOTE — ED Triage Notes (Signed)
 Nausea and vomiting all day. Says recently admitted for DKA and he feels like its happening again.

## 2024-11-22 NOTE — H&P (Incomplete)
 " History and Physical  Jacob [name] FMW:[id number] DOB: [birth year] DOA: 11/22/2024  PCP: Napoleon Limes, MD   Chief Complaint: Hyperglycemia, nausea and vomiting  HPI: Jacob [name] is a 34 y.o. male with medical history significant for uncontrolled type 1 diabetes, recurrent admissions for DKA, MDD with psychosis, GAD, suicidal ideation, schizoaffective disorder-bipolar type and marijuana use disorder who presented to the ED for evaluation of elevated blood sugar, nausea and vomiting.  Patient reports his blood sugars are elevated and he has had nausea and vomiting since yesterday. He denies any abdominal pain, headache, dizziness, chest pain, fever, chills, shortness of breath or dysuria.  Reports he is taking his insulin  as prescribed. Endorsed marijuana use a few days ago.  ED Course: Initial vitals show afebrile, HR 90-1 20s, SBP 130-140s. Initial labs significant for glucose 234, bicarb 15, anion gap 32, creatinine 1.40, calcium  10.7, BHB  >8.0, unremarkable VBG. Pt received IV Pepcid , IV Zofran , IV KCl 10 mEq x 2, IV LR 1 L bolus x 2 and started on insulin  drip. [facility] was consulted for admission.   Review of Systems: Please see HPI for pertinent positives and negatives. A complete 10 system review of systems are otherwise negative.  Past Medical History:  Diagnosis Date   Bipolar 1 disorder (HCC)    Diabetes mellitus without complication (HCC)    History of attempted suicide 2019   Unsuccessful suicide attempt in 2019 with rat poison   Schizoaffective disorder (HCC)    Type 1 diabetes mellitus on insulin  therapy (HCC) 2019   Vitamin D  deficiency 01/03/2024   History reviewed. No pertinent surgical history. Social History:  reports that he has been smoking. He has never used smokeless tobacco. He reports that he does not currently use alcohol. He reports that he does not currently use drugs after having used the following drugs: Marijuana.  Allergies[1]  Family History   Problem Relation Age of Onset   Healthy Mother    Healthy Father      Prior to Admission medications  Medication Sig Start Date End Date Taking? Authorizing Provider  Accu-Chek Softclix Lancets lancets Check Blood Sugar up to three times a Day 11/15/24   Charmayne Holmes, DO  Blood Glucose Monitoring Suppl (BLOOD GLUCOSE MONITOR SYSTEM) w/Device KIT Use in the morning, at noon, and at bedtime. 05/28/24   Scott, Rocky SAILOR, [name]  Continuous Glucose Sensor (DEXCOM G7 SENSOR) MISC Place new sensor every 10 days. Use to monitor blood sugar continuously. 08/29/24   Trudy Mliss Dragon, MD  glucose blood (ACCU-CHEK GUIDE TEST) test strip Testing in the morning, at noon, and at bedtime. 06/27/24   [name], DO  haloperidol  decanoate (HALDOL  DECANOATE) 100 MG/ML injection Inject 2 mLs (200 mg total) into the muscle every 28 (twenty-eight) days. 09/24/24   Harl Zane BRAVO, NP  hydrOXYzine  (ATARAX ) 25 MG tablet Take 1 tablet (25 mg total) by mouth 3 (three) times daily as needed for anxiety. 09/24/24   Harl Zane BRAVO, NP  ibuprofen  (ADVIL ) 200 MG tablet Take 400 mg by mouth 2 (two) times daily as needed for headache or moderate pain (pain score 4-6).    [provider]  insulin  aspart (NOVOLOG  FLEXPEN) 100 UNIT/ML FlexPen Inject 11 Units into the skin 3 (three) times daily with meals. For diabetes control 09/10/24   Elicia Sharper, DO  insulin  degludec (TRESIBA  FLEXTOUCH) 100 UNIT/ML FlexTouch Pen Inject 32 Units into the skin daily. May increase up to 35 units daily if instructed by your provider. 09/10/24  Elicia Sharper, DO  Insulin  Pen Needle 32G X 4 MM MISC Inject 1 each into the skin 3 (three) times daily. 07/25/24   Rihner, Emilie, DO  nicotine  (NICODERM CQ  - DOSED IN MG/24 HOURS) 21 mg/24hr patch Place 1 patch (21 mg total) onto the skin daily. Patient not taking: Reported on 11/03/2024 09/24/24   Harl Zane BRAVO, NP  propranolol  (INDERAL ) 10 MG tablet Take 1 tablet (10 mg total)  by mouth 2 (two) times daily. 10/02/24   Harl Zane BRAVO, NP    Physical Exam: BP (!) 146/80   Pulse 100   Temp 98.5 F (36.9 C)   Resp 16   Ht 5' 9 (1.753 m)   Wt 62.1 kg   SpO2 100%   BMI 20.23 kg/m  General: Lethargic appearing young man laying in bed. No acute distress. HEENT: [place]/AT. Anicteric sclera. Cracked lips. Dry mucous membrane. CV: Tachycardic. Regular rhythm. No murmurs, rubs, or gallops. No LE edema Pulmonary: Lungs CTAB. Normal effort. No wheezing or rales. Abdominal: Soft, nontender, nondistended. Normal bowel sounds. Extremities: Palpable radial and DP pulses. Normal ROM. Skin: Warm and dry. No obvious rash or lesions. Neuro: Drowsy but oriented x 3. Moves all extremities. Normal sensation to light touch. No focal deficit. Psych: Normal mood and affect          Labs on Admission:  Basic Metabolic Panel: Recent Labs  Lab 11/22/24 2011  NA 141  K 4.8  CL 94*  CO2 15*  GLUCOSE 234*  BUN 24*  CREATININE 1.40*  CALCIUM  10.7*   Liver Function Tests: Recent Labs  Lab 11/22/24 2011  AST 31  ALT 41  ALKPHOS 113  BILITOT 0.5  PROT 9.6*  ALBUMIN 5.5*   No results for input(s): LIPASE, AMYLASE in the last 168 hours. No results for input(s): AMMONIA in the last 168 hours. CBC: Recent Labs  Lab 11/22/24 2011  WBC 8.3  HGB 16.6  HCT 50.1  MCV 85.5  PLT 450*   Cardiac Enzymes: No results for input(s): CKTOTAL, CKMB, CKMBINDEX, TROPONINI in the last 168 hours. BNP (last 3 results) No results for input(s): BNP in the last 8760 hours.  ProBNP (last 3 results) No results for input(s): PROBNP in the last 8760 hours.  CBG: Recent Labs  Lab 11/22/24 1942 11/22/24 2341  GLUCAP 202* 260*    Radiological Exams on Admission: No results found. Assessment/Plan Jacob [name] is a 34 y.o. male with medical history significant for uncontrolled type 1 diabetes, recurrent admissions for DKA, MDD with psychosis, GAD, suicidal  ideation, schizoaffective disorder-bipolar type and marijuana use disorder who presented to the ED for evaluation of elevated blood sugar, nausea and vomiting   # Diabetic ketoacidosis - Presented with generalized lethargy, nausea and vomiting - Found to have hyperglycemia, AGMA, and ketonemia - Hx of uncontrolled diabetes with last A1c >14% 2 weeks ago - S/p LR bolus and initiation of insulin  drip in ED - Pt lethargic appearing but hemodynamically stable - Continue insulin  drip - LR @ 125 mL/hr until CBG less than 250 - Switch to D5-LR when 1 CBG less than 250 - Keep NPO  - BMP Q4H, CBG Q1H, BHB Q8H - Once anion gap closed 2, start CM diet and if able to eat, administer Semglee  32 units - Continue insulin  drip for 1-2 more hours, then discontinue and start SSI-S  - DC fluids if eating, drinking, and off insulin  drip  # AKI - Creatinine elevated to 1.40 in the setting of  dehydration and DKA - Continue IV hydration as above - Trend renal function avoid nephrotoxic agents  # MDD with psychosis  # Schizoaffective disorder, bipolar type  # Generalized Anxiety Disorder - Patient with long history of untreated mood disorder due to medication noncompliance and polysubstance abuse.  - Unable to verify meds at the moment, resume psych meds after completion of med rec   # Substance use disorder - Follow-up UDS  DVT prophylaxis: Lovenox      Code Status: Prior  Consults called: None  Family Communication: No family at bedside  Severity of Illness: {Observation/Inpatient:21159}  Level of care: Jacob Lou Claretta CHRISTELLA, MD 11/23/2024, 12:03 AM [facility] Pager: [phone] Isaiah 41:10   If 7PM-7AM, please contact night-coverage [email or web address] Password [email or web address]       [1] No Known Allergies "

## 2024-11-22 NOTE — H&P (Signed)
 " History and Physical  Jacob [name] FMW:[id number] DOB: November 06, 1990 DOA: 11/22/2024  PCP: Napoleon Limes, MD   Chief Complaint: Hyperglycemia, nausea and vomiting  HPI: Jacob [name] is a 34 y.o. male with medical history significant for uncontrolled type 1 diabetes, recurrent admissions for DKA, MDD with psychosis, GAD, suicidal ideation, schizoaffective disorder-bipolar type and marijuana use disorder who presented to the ED for evaluation of elevated blood sugar, nausea and vomiting.  Patient reports his blood sugars are elevated and he has had nausea and vomiting since yesterday. He denies any abdominal pain, headache, dizziness, chest pain, fever, chills, shortness of breath or dysuria.  Reports he is taking his insulin  as prescribed. Endorsed marijuana use a few days ago.  ED Course: Initial vitals show afebrile, HR 90-1 20s, SBP 130-140s. Initial labs significant for glucose 234, bicarb 15, anion gap 32, creatinine 1.40, calcium  10.7, BHB  >8.0, unremarkable VBG. Pt received IV Pepcid , IV Zofran , IV KCl 10 mEq x 2, IV LR 1 L bolus x 2 and started on insulin  drip. TRH was consulted for admission.   Review of Systems: Please see HPI for pertinent positives and negatives. A complete 10 system review of systems are otherwise negative.  Past Medical History:  Diagnosis Date   Bipolar 1 disorder (HCC)    Diabetes mellitus without complication (HCC)    History of attempted suicide 2019   Unsuccessful suicide attempt in 2019 with rat poison   Schizoaffective disorder (HCC)    Type 1 diabetes mellitus on insulin  therapy (HCC) 2019   Vitamin D  deficiency 01/03/2024   History reviewed. No pertinent surgical history. Social History:  reports that he has been smoking. He has never used smokeless tobacco. He reports that he does not currently use alcohol. He reports that he does not currently use drugs after having used the following drugs: Marijuana.  Allergies[1]  Family History  Problem  Relation Age of Onset   Healthy Mother    Healthy Father      Prior to Admission medications  Medication Sig Start Date End Date Taking? Authorizing Provider  Accu-Chek Softclix Lancets lancets Check Blood Sugar up to three times a Day 11/15/24   Charmayne Holmes, DO  Blood Glucose Monitoring Suppl (BLOOD GLUCOSE MONITOR SYSTEM) w/Device KIT Use in the morning, at noon, and at bedtime. 05/28/24   Scott, Rocky SAILOR, [name]  Continuous Glucose Sensor (DEXCOM G7 SENSOR) MISC Place new sensor every 10 days. Use to monitor blood sugar continuously. 08/29/24   Trudy Mliss Dragon, MD  glucose blood (ACCU-CHEK GUIDE TEST) test strip Testing in the morning, at noon, and at bedtime. 06/27/24   [name], DO  haloperidol  decanoate (HALDOL  DECANOATE) 100 MG/ML injection Inject 2 mLs (200 mg total) into the muscle every 28 (twenty-eight) days. 09/24/24   Harl Zane BRAVO, NP  hydrOXYzine  (ATARAX ) 25 MG tablet Take 1 tablet (25 mg total) by mouth 3 (three) times daily as needed for anxiety. 09/24/24   Harl Zane BRAVO, NP  ibuprofen  (ADVIL ) 200 MG tablet Take 400 mg by mouth 2 (two) times daily as needed for headache or moderate pain (pain score 4-6).    [provider]  insulin  aspart (NOVOLOG  FLEXPEN) 100 UNIT/ML FlexPen Inject 11 Units into the skin 3 (three) times daily with meals. For diabetes control 09/10/24   Elicia Sharper, DO  insulin  degludec (TRESIBA  FLEXTOUCH) 100 UNIT/ML FlexTouch Pen Inject 32 Units into the skin daily. May increase up to 35 units daily if instructed by your provider. 09/10/24  Elicia Sharper, DO  Insulin  Pen Needle 32G X 4 MM MISC Inject 1 each into the skin 3 (three) times daily. 07/25/24   Rihner, Emilie, DO  nicotine  (NICODERM CQ  - DOSED IN MG/24 HOURS) 21 mg/24hr patch Place 1 patch (21 mg total) onto the skin daily. Patient not taking: Reported on 11/03/2024 09/24/24   Harl Zane BRAVO, NP  propranolol  (INDERAL ) 10 MG tablet Take 1 tablet (10 mg total) by mouth 2  (two) times daily. 10/02/24   Harl Zane BRAVO, NP    Physical Exam: BP (!) 146/80   Pulse 100   Temp 98.5 F (36.9 C)   Resp 16   Ht 5' 9 (1.753 m)   Wt 62.1 kg   SpO2 100%   BMI 20.23 kg/m  General: Lethargic appearing young man laying in bed. No acute distress. HEENT: [place]/AT. Anicteric sclera. Cracked lips. Dry mucous membrane. CV: Tachycardic. Regular rhythm. No murmurs, rubs, or gallops. No LE edema Pulmonary: Lungs CTAB. Normal effort. No wheezing or rales. Abdominal: Soft, nontender, nondistended. Normal bowel sounds. Extremities: Palpable radial and DP pulses. Normal ROM. Skin: Warm and dry. No obvious rash or lesions. Neuro: Drowsy but oriented x 3. Moves all extremities. Normal sensation to light touch. No focal deficit. Psych: Normal mood and affect          Labs on Admission:  Basic Metabolic Panel: Recent Labs  Lab 11/22/24 2011  NA 141  K 4.8  CL 94*  CO2 15*  GLUCOSE 234*  BUN 24*  CREATININE 1.40*  CALCIUM  10.7*   Liver Function Tests: Recent Labs  Lab 11/22/24 2011  AST 31  ALT 41  ALKPHOS 113  BILITOT 0.5  PROT 9.6*  ALBUMIN 5.5*   No results for input(s): LIPASE, AMYLASE in the last 168 hours. No results for input(s): AMMONIA in the last 168 hours. CBC: Recent Labs  Lab 11/22/24 2011  WBC 8.3  HGB 16.6  HCT 50.1  MCV 85.5  PLT 450*   Cardiac Enzymes: No results for input(s): CKTOTAL, CKMB, CKMBINDEX, TROPONINI in the last 168 hours. BNP (last 3 results) No results for input(s): BNP in the last 8760 hours.  ProBNP (last 3 results) No results for input(s): PROBNP in the last 8760 hours.  CBG: Recent Labs  Lab 11/22/24 1942 11/22/24 2341  GLUCAP 202* 260*    Radiological Exams on Admission: No results found. Assessment/Plan Jacob [name] is a 34 y.o. male with medical history significant for uncontrolled type 1 diabetes, recurrent admissions for DKA, MDD with psychosis, GAD, suicidal ideation,  schizoaffective disorder-bipolar type and marijuana use disorder who presented to the ED for evaluation of elevated blood sugar, nausea and vomiting   # Diabetic ketoacidosis # Uncontrolled type 1 diabetes - Patient with recurrent hospitalization for DKA presented with generalized lethargy, nausea and vomiting - Found to have moderate hyperglycemia, AGMA, and ketonemia - Hx of uncontrolled diabetes with last A1c >14% 2 weeks ago - S/p LR bolus and initiation of insulin  drip in ED - Pt lethargic appearing but hemodynamically stable - Continue insulin  drip - LR @ 125 mL/hr until CBG less than 250 - Switch to D5-LR when 1 CBG less than 250 - Keep NPO except sips with meds and ice chips - BMP and BHB Q4H, CBG Q1H - Once anion gap closed 2, start CM diet and if able to eat, administer Semglee  32 units - Continue insulin  drip for 1-2 more hours, then discontinue and start SSI-S  - DC fluids if  eating, drinking, and off insulin  drip  # AKI - Creatinine elevated to 1.40 in the setting of dehydration and DKA - Continue IV hydration as above - Trend renal function and avoid nephrotoxic agents  # MDD with psychosis  # Schizoaffective disorder, bipolar type  # Generalized Anxiety Disorder - Patient with long history of untreated mood disorder due to medication noncompliance and polysubstance abuse.  - Unable to verify meds at the moment, resume psych meds after completion of med rec   # Substance use disorder - Follow-up UDS  DVT prophylaxis: Lovenox      Code Status: Full Code  Consults called: None  Family Communication: No family at bedside  Severity of Illness: The appropriate patient status for this patient is OBSERVATION. Observation status is judged to be reasonable and necessary in order to provide the required intensity of service to ensure the patient's safety. The patient's presenting symptoms, physical exam findings, and initial radiographic and laboratory data in the context  of their medical condition is felt to place them at decreased risk for further clinical deterioration. Furthermore, it is anticipated that the patient will be medically stable for discharge from the hospital within 2 midnights of admission.   Level of care: Wray Lou Claretta CHRISTELLA, MD 11/23/2024, 12:04 AM [facility] Pager: [phone] Isaiah 41:10   If 7PM-7AM, please contact night-coverage [email or web address] Password [email or web address]     [1] No Known Allergies  "

## 2024-11-23 ENCOUNTER — Other Ambulatory Visit (HOSPITAL_COMMUNITY): Payer: Self-pay

## 2024-11-23 ENCOUNTER — Other Ambulatory Visit: Payer: Self-pay

## 2024-11-23 DIAGNOSIS — F1990 Other psychoactive substance use, unspecified, uncomplicated: Secondary | ICD-10-CM | POA: Diagnosis not present

## 2024-11-23 DIAGNOSIS — N179 Acute kidney failure, unspecified: Secondary | ICD-10-CM | POA: Diagnosis not present

## 2024-11-23 DIAGNOSIS — R112 Nausea with vomiting, unspecified: Secondary | ICD-10-CM | POA: Diagnosis not present

## 2024-11-23 DIAGNOSIS — E1010 Type 1 diabetes mellitus with ketoacidosis without coma: Secondary | ICD-10-CM | POA: Diagnosis not present

## 2024-11-23 LAB — BASIC METABOLIC PANEL WITH GFR
Anion gap: 13 (ref 5–15)
BUN: 19 mg/dL (ref 6–20)
CO2: 25 mmol/L (ref 22–32)
Calcium: 9.8 mg/dL (ref 8.9–10.3)
Chloride: 102 mmol/L (ref 98–111)
Creatinine, Ser: 0.97 mg/dL (ref 0.61–1.24)
GFR, Estimated: >60 mL/min
Glucose, Bld: 152 mg/dL — ABNORMAL HIGH (ref 70–99)
Potassium: 4.3 mmol/L (ref 3.5–5.1)
Sodium: 140 mmol/L (ref 135–145)

## 2024-11-23 LAB — URINALYSIS, ROUTINE W REFLEX MICROSCOPIC
Bacteria, UA: NONE SEEN
Bilirubin Urine: NEGATIVE
Glucose, UA: >=500 mg/dL — AB
Hgb urine dipstick: NEGATIVE
Ketones, ur: 80 mg/dL — AB
Leukocytes,Ua: NEGATIVE
Nitrite: NEGATIVE
Protein, ur: 100 mg/dL — AB
Specific Gravity, Urine: 1.024 (ref 1.005–1.030)
pH: 5 (ref 5.0–8.0)

## 2024-11-23 LAB — CBG MONITORING, ED
Glucose-Capillary: 108 mg/dL — ABNORMAL HIGH (ref 70–99)
Glucose-Capillary: 109 mg/dL — ABNORMAL HIGH (ref 70–99)
Glucose-Capillary: 118 mg/dL — ABNORMAL HIGH (ref 70–99)
Glucose-Capillary: 142 mg/dL — ABNORMAL HIGH (ref 70–99)
Glucose-Capillary: 145 mg/dL — ABNORMAL HIGH (ref 70–99)
Glucose-Capillary: 157 mg/dL — ABNORMAL HIGH (ref 70–99)
Glucose-Capillary: 95 mg/dL (ref 70–99)

## 2024-11-23 LAB — RENAL FUNCTION PANEL
Albumin: 4 g/dL (ref 3.5–5.0)
Anion gap: 11 (ref 5–15)
BUN: 15 mg/dL (ref 6–20)
CO2: 25 mmol/L (ref 22–32)
Calcium: 9.5 mg/dL (ref 8.9–10.3)
Chloride: 100 mmol/L (ref 98–111)
Creatinine, Ser: 0.98 mg/dL (ref 0.61–1.24)
GFR, Estimated: >60 mL/min
Glucose, Bld: 110 mg/dL — ABNORMAL HIGH (ref 70–99)
Phosphorus: 3.3 mg/dL (ref 2.5–4.6)
Potassium: 4.1 mmol/L (ref 3.5–5.1)
Sodium: 136 mmol/L (ref 135–145)

## 2024-11-23 LAB — CBC
HCT: 41.6 % (ref 39.0–52.0)
HCT: 40 % (ref 39.0–52.0)
Hemoglobin: 13.6 g/dL (ref 13.0–17.0)
Hemoglobin: 13 g/dL (ref 13.0–17.0)
MCH: 28 pg (ref 26.0–34.0)
MCH: 28 pg (ref 26.0–34.0)
MCHC: 32.7 g/dL (ref 30.0–36.0)
MCHC: 32.5 g/dL (ref 30.0–36.0)
MCV: 85.6 fL (ref 80.0–100.0)
MCV: 86 fL (ref 80.0–100.0)
Platelets: 408 K/uL — ABNORMAL HIGH (ref 150–400)
Platelets: 323 K/uL (ref 150–400)
RBC: 4.86 MIL/uL (ref 4.22–5.81)
RBC: 4.65 MIL/uL (ref 4.22–5.81)
RDW: 12.7 % (ref 11.5–15.5)
RDW: 12.8 % (ref 11.5–15.5)
WBC: 8.3 K/uL (ref 4.0–10.5)
WBC: 6.9 K/uL (ref 4.0–10.5)
nRBC: 0 % (ref 0.0–0.2)
nRBC: 0 % (ref 0.0–0.2)

## 2024-11-23 LAB — RESP PANEL BY RT-PCR (RSV, FLU A&B, COVID)  RVPGX2
Influenza A by PCR: NEGATIVE
Influenza B by PCR: NEGATIVE
Resp Syncytial Virus by PCR: NEGATIVE
SARS Coronavirus 2 by RT PCR: NEGATIVE

## 2024-11-23 LAB — BETA-HYDROXYBUTYRIC ACID
Beta-Hydroxybutyric Acid: 2.68 mmol/L — ABNORMAL HIGH (ref 0.05–0.27)
Beta-Hydroxybutyric Acid: 1.11 mmol/L — ABNORMAL HIGH (ref 0.05–0.27)

## 2024-11-23 LAB — GLUCOSE, CAPILLARY
Glucose-Capillary: 98 mg/dL (ref 70–99)
Glucose-Capillary: 134 mg/dL — ABNORMAL HIGH (ref 70–99)
Glucose-Capillary: 150 mg/dL — ABNORMAL HIGH (ref 70–99)
Glucose-Capillary: 309 mg/dL — ABNORMAL HIGH (ref 70–99)

## 2024-11-23 LAB — URINE DRUG SCREEN
Amphetamines: NEGATIVE
Barbiturates: NEGATIVE
Benzodiazepines: NEGATIVE
Cocaine: POSITIVE — AB
Fentanyl: NEGATIVE
Methadone Scn, Ur: NEGATIVE
Opiates: NEGATIVE
Tetrahydrocannabinol: POSITIVE — AB

## 2024-11-23 LAB — MAGNESIUM: Magnesium: 2 mg/dL (ref 1.7–2.4)

## 2024-11-23 MED ORDER — ONDANSETRON HCL 4 MG PO TABS: 4.0000 mg | ORAL_TABLET | Freq: Four times a day (QID) | ORAL | Status: DC | PRN

## 2024-11-23 MED ORDER — METOCLOPRAMIDE HCL 5 MG PO TABS
5.0000 mg | ORAL_TABLET | Freq: Four times a day (QID) | ORAL | 0 refills | Status: AC | PRN
  Filled 2024-11-23: qty 90, 23d supply, fill #0

## 2024-11-23 MED ORDER — ONDANSETRON HCL 4 MG/2ML IJ SOLN: 4.0000 mg | Freq: Four times a day (QID) | INTRAMUSCULAR | Status: DC | PRN

## 2024-11-23 MED ORDER — INSULIN ASPART 100 UNIT/ML IJ SOLN: 0.0000 [IU] | Freq: Every day | INTRAMUSCULAR | Status: DC

## 2024-11-23 MED ORDER — CHLORHEXIDINE GLUCONATE CLOTH 2 % EX PADS
6.0000 | MEDICATED_PAD | Freq: Every day | CUTANEOUS | Status: DC
  Administered 2024-11-23: 6 via TOPICAL

## 2024-11-23 MED ORDER — ORAL CARE MOUTH RINSE: 15.0000 mL | OROMUCOSAL | Status: DC | PRN

## 2024-11-23 MED ORDER — ACETAMINOPHEN 325 MG PO TABS: 650.0000 mg | ORAL_TABLET | Freq: Four times a day (QID) | ORAL | Status: DC | PRN

## 2024-11-23 MED ORDER — ENOXAPARIN SODIUM 40 MG/0.4ML IJ SOSY
40.0000 mg | PREFILLED_SYRINGE | INTRAMUSCULAR | Status: DC
  Administered 2024-11-23: 40 mg via SUBCUTANEOUS
  Filled 2024-11-23: qty 0.4

## 2024-11-23 MED ORDER — BISACODYL 5 MG PO TBEC: 5.0000 mg | DELAYED_RELEASE_TABLET | Freq: Every day | ORAL | Status: DC | PRN

## 2024-11-23 MED ORDER — CHLORHEXIDINE GLUCONATE CLOTH 2 % EX PADS: 6.0000 | MEDICATED_PAD | Freq: Every day | CUTANEOUS | Status: DC

## 2024-11-23 MED ORDER — INSULIN ASPART 100 UNIT/ML IJ SOLN
4.0000 [IU] | Freq: Three times a day (TID) | INTRAMUSCULAR | Status: DC
  Administered 2024-11-23: 4 [IU] via SUBCUTANEOUS
  Filled 2024-11-23: qty 4

## 2024-11-23 MED ORDER — INSULIN GLARGINE-YFGN 100 UNIT/ML ~~LOC~~ SOLN
15.0000 [IU] | SUBCUTANEOUS | Status: DC
  Administered 2024-11-23: 15 [IU] via SUBCUTANEOUS
  Filled 2024-11-23: qty 0.15

## 2024-11-23 MED ORDER — ACETAMINOPHEN 650 MG RE SUPP: 650.0000 mg | Freq: Four times a day (QID) | RECTAL | Status: DC | PRN

## 2024-11-23 MED ORDER — SENNOSIDES-DOCUSATE SODIUM 8.6-50 MG PO TABS: 1.0000 | ORAL_TABLET | Freq: Every evening | ORAL | Status: DC | PRN

## 2024-11-23 MED ORDER — INSULIN ASPART 100 UNIT/ML IJ SOLN
0.0000 [IU] | Freq: Three times a day (TID) | INTRAMUSCULAR | Status: DC
  Administered 2024-11-23: 11 [IU] via SUBCUTANEOUS
  Filled 2024-11-23: qty 11

## 2024-11-23 NOTE — Discharge Instructions (Addendum)
 FOOD PANTRY [employer] [facility] [address] [phone]  [facility] [address] [place] [phone]  [facility] - Food Distribution Center [address] [place] [phone]  [facility] [address] [phone]  [facility] - Food Distribution Center [address] [place], [place] [address] [phone]  UTILITIES [facility] [address] [place] [phone] Rental assistance/rental hotline: [phone] ext. [phone].    Utility assistance/utility hotline: [phone] ext. [phone] [employer] Consultant (heating/cooling and water  assistance) [phone] (rental and utility assistance) [phone]  [employer] - call 211  TRANSPORTATION [facility] [address] [place], [place] [address] [phone]  [employer] [place] I-Ride Reservations Line: [phone]     Passengers can simply call the I-R[employer] reservations number at [phone] for pickup. However, with [employer] same-day service is available with at least two hour notice Monday through Friday. You will need your [employer] [place] client ID# when you call for reservations. If you do not know it, you can call [phone] to request it. [employer] offers a flat fare of $8.50 per trip. This will cover travel anywhere within the city limits of [place].  Medicaid Transportation: Based on county of residence. If you have Medicaid, you can schedule transportation to medical appointments using the following numbers based off which insurance manages your Medicaid plan: ? [employer]- [phone]  ? [employer]- [phone]  ? [phone]  ? [place] [employer]- [phone]  ? [employer]- [phone]  ? [employer] [place]- [phone]

## 2024-11-23 NOTE — Discharge Summary (Addendum)
 "  Physician Discharge Summary  Jacob [name] FMW:[id number] DOB: 04/05/1991 DOA: 11/22/2024  PCP: Napoleon Limes, MD  Admit date: 11/22/2024 Discharge date: 11/23/24  Admitted From: Home Disposition: Home Recommendations for Outpatient Follow-up:  Outpatient follow-up with PCP/endocrinology in 1 to 2 weeks Check glycemic control, compliance, CMP and CBC at follow-up Please follow up on the following pending results: Urine drug screen  Home Health: No need identified Equipment/Devices: No need identified  Discharge Condition: Stable CODE STATUS: Full code   Follow-up Information     Smucker, Limes, MD. Schedule an appointment as soon as possible for a visit in 1 week(s).   Specialty: Internal Medicine Contact information: [address] [place] [address] [place] [address] [phone]                 Hospital course 34 year old M with PMH of uncontrolled DM-1, recurrent admission for DKA, MDD with psychosis, GAD, suicidal ideation, schizoaffective disorder-bipolar type, noncompliance with meds, and marijuana use disorder presented to ED with nausea, vomiting and elevated blood sugar, and admitted with DKA and AKI.   In ED, HR in 120s but improved quickly.  pH 7.25. Cr 1.4.  BUN 24.  Glucose 234.  Bicarb 15.  AG 32.  BHA > 8.0.  COVID-19, influenza and RSV PCR nonreactive.  Patient was started on IV fluid and insulin  per DKA protocol, and admitted for overnight observation.  The next day, DKA and GI symptoms resolved.  Tolerated current modified diet.  Discharged on home insulin .  Patient stated that he has his insulin  and diabetic supplies at home.  Encouraged to maintain good hydration.  Prescribed Reglan in case his nausea and vomiting recurs.   See individual problem list below for more.   Problems addressed during this hospitalization Uncontrolled DM-1 with diabetic ketoacidosis: DKA resolved..  A1c > 14% on 1/6. -Discharged with home insulin . -Described p.o.  Reglan in case his nausea and vomiting recurs. -Advised to refrain from substance use.   History of GAD/MDD/schizoaffective disorder/suicidal ideation: Seems like he is on monthly Haldol  injection outpatient.   Substance use: Admits to using cocaine, marijuana.  Also reports smoking cigarettes. - Counseled on the importance of cessation.  Noncompliance -Counseled on the importance of using his medication as prescribed.   AKI: Resolved -Encouraged to maintain good hydration.   Nausea and vomiting: Resolved.  Body mass index is 18.72 kg/m.           Consultations: None  Time spent 35  minutes  Vital signs Vitals:   11/23/24 0805 11/23/24 0900 11/23/24 1000 11/23/24 1100  BP:  (!) 122/95 (!) 130/101 (!) 142/97  Pulse: (!) 59 66 67 83  Temp:      Resp: 17 12 13 16   Height:      Weight:      SpO2: 100% 100% 100% 100%  TempSrc:      BMI (Calculated):         Discharge exam  GENERAL: No apparent distress.  Nontoxic. HEENT: MMM.  Vision and hearing grossly intact.  NECK: Supple.  No apparent JVD.  RESP:  No IWOB.  Fair aeration bilaterally. CVS:  RRR. Heart sounds normal.  ABD/GI/GU: BS+. Abd soft, NTND.  MSK/EXT:  Moves extremities. No apparent deformity. No edema.  SKIN: no apparent skin lesion or wound NEURO: Awake and alert. Oriented appropriately.  No apparent focal neuro deficit. PSYCH: Calm. Normal affect.   Discharge Instructions Discharge Instructions     Discharge instructions   Complete by: As  directed    It has been a pleasure taking care of you!  You were hospitalized due to diabetic ketoacidosis likely from not using your insulin .  Diabetic ketoacidosis resolved.  Your blood glucose improved.  There is very important that you use your insulin  as prescribed.  It is also very important that you watch your carbohydrate intake.  Strongly recommend you stop using recreational drugs and stop smoking cigarettes.  Please follow-up with your primary care  doctor or endocrinologist in 1 to 2 weeks or sooner if needed.  Maintain good hydration.   Take care,   Increase activity slowly   Complete by: As directed       Allergies as of 11/23/2024   No Known Allergies      Medication List     TAKE these medications    Accu-Chek Guide Test test strip Generic drug: glucose blood Testing in the morning, at noon, and at bedtime.   Accu-Chek Softclix Lancets lancets Check Blood Sugar up to three times a Day   Blood Glucose Monitor System w/Device Kit Use in the morning, at noon, and at bedtime.   Dexcom G7 Sensor Misc Place new sensor every 10 days. Use to monitor blood sugar continuously.   haloperidol  decanoate 100 MG/ML injection Commonly known as: HALDOL  DECANOATE Inject 2 mLs (200 mg total) into the muscle every 28 (twenty-eight) days.   hydrOXYzine  25 MG tablet Commonly known as: ATARAX  Take 1 tablet (25 mg total) by mouth 3 (three) times daily as needed for anxiety.   Insulin  Aspart FlexPen 100 UNIT/ML Commonly known as: NOVOLOG  Inject 11 Units into the skin 3 (three) times daily with meals. For diabetes control What changed:  how much to take additional instructions   metoCLOPramide 5 MG tablet Commonly known as: Reglan Take 1 tablet (5 mg total) by mouth every 6 (six) hours as needed for nausea or vomiting.   nicotine  21 mg/24hr patch Commonly known as: NICODERM CQ  - dosed in mg/24 hours Place 1 patch (21 mg total) onto the skin daily.   propranolol  10 MG tablet Commonly known as: INDERAL  Take 1 tablet (10 mg total) by mouth 2 (two) times daily.   Tresiba  FlexTouch 100 UNIT/ML FlexTouch Pen Generic drug: insulin  degludec Inject 32 Units into the skin daily. May increase up to 35 units daily if instructed by your provider. What changed:  how much to take additional instructions         Procedures/Studies:   DG Chest Port 1 View Result Date: 11/02/2024 EXAM: 1 VIEW(S) XRAY OF THE CHEST 11/02/2024  12:26:00 PM COMPARISON: 08/02/2024 CLINICAL HISTORY: weak FINDINGS: LINES, TUBES AND DEVICES: Telemetry leads noted. LUNGS AND PLEURA: No focal pulmonary opacity. No pleural effusion. No pneumothorax. HEART AND MEDIASTINUM: No acute abnormality of the cardiac and mediastinal silhouettes. BONES AND SOFT TISSUES: No acute osseous abnormality. IMPRESSION: 1. No acute process. Electronically signed by: Waddell Calk MD 11/02/2024 01:25 PM EST RP Workstation: [id number]       The results of significant diagnostics from this hospitalization (including imaging, microbiology, ancillary and laboratory) are listed below for reference.     Microbiology: Recent Results (from the past 240 hours)  Resp panel by RT-PCR (RSV, Flu A&B, Covid) Anterior Nasal Swab     Status: None   Collection Time: 11/23/24 12:13 AM   Specimen: Anterior Nasal Swab  Result Value Ref Range Status   SARS Coronavirus 2 by RT PCR NEGATIVE NEGATIVE Final    Comment: (NOTE) SARS-CoV-2 target nucleic acids  are NOT DETECTED.  The SARS-CoV-2 RNA is generally detectable in upper respiratory specimens during the acute phase of infection. The lowest concentration of SARS-CoV-2 viral copies this assay can detect is 138 copies/mL. A negative result does not preclude SARS-Cov-2 infection and should not be used as the sole basis for treatment or other patient management decisions. A negative result may occur with  improper specimen collection/handling, submission of specimen other than nasopharyngeal swab, presence of viral mutation(s) within the areas targeted by this assay, and inadequate number of viral copies(<138 copies/mL). A negative result must be combined with clinical observations, patient history, and epidemiological information. The expected result is Negative.  Fact Sheet for Patients:  bloggercourse.com  Fact Sheet for Healthcare Providers:  seriousbroker.it  This  test is no t yet approved or cleared by the [place]  FDA and  has been authorized for detection and/or diagnosis of SARS-CoV-2 by FDA under an Emergency Use Authorization (EUA). This EUA will remain  in effect (meaning this test can be used) for the duration of the COVID-19 declaration under Section 564(b)(1) of the Act, 21 U.S.C.section 360bbb-3(b)(1), unless the authorization is terminated  or revoked sooner.       Influenza A by PCR NEGATIVE NEGATIVE Final   Influenza B by PCR NEGATIVE NEGATIVE Final    Comment: (NOTE) The Xpert Xpress SARS-CoV-2/FLU/RSV plus assay is intended as an aid in the diagnosis of influenza from Nasopharyngeal swab specimens and should not be used as a sole basis for treatment. Nasal washings and aspirates are unacceptable for Xpert Xpress SARS-CoV-2/FLU/RSV testing.  Fact Sheet for Patients: bloggercourse.com  Fact Sheet for Healthcare Providers: seriousbroker.it  This test is not yet approved or cleared by the [place]  FDA and has been authorized for detection and/or diagnosis of SARS-CoV-2 by FDA under an Emergency Use Authorization (EUA). This EUA will remain in effect (meaning this test can be used) for the duration of the COVID-19 declaration under Section 564(b)(1) of the Act, 21 U.S.C. section 360bbb-3(b)(1), unless the authorization is terminated or revoked.     Resp Syncytial Virus by PCR NEGATIVE NEGATIVE Final    Comment: (NOTE) Fact Sheet for Patients: bloggercourse.com  Fact Sheet for Healthcare Providers: seriousbroker.it  This test is not yet approved or cleared by the [place]  FDA and has been authorized for detection and/or diagnosis of SARS-CoV-2 by FDA under an Emergency Use Authorization (EUA). This EUA will remain in effect (meaning this test can be used) for the duration of the COVID-19 declaration under  Section 564(b)(1) of the Act, 21 U.S.C. section 360bbb-3(b)(1), unless the authorization is terminated or revoked.  Performed at [facility], [address], [place], [place] [address]      Labs:  CBC: Recent Labs  Lab 11/22/24 2011 11/23/24 0259 11/23/24 0825  WBC 8.3 8.3 6.9  HGB 16.6 13.6 13.0  HCT 50.1 41.6 40.0  MCV 85.5 85.6 86.0  PLT 450* 408* 323   BMP &GFR Recent Labs  Lab 11/22/24 2011 11/23/24 0257 11/23/24 0825  NA 141 140 136  K 4.8 4.3 4.1  CL 94* 102 100  CO2 15* 25 25  GLUCOSE 234* 152* 110*  BUN 24* 19 15  CREATININE 1.40* 0.97 0.98  CALCIUM  10.7* 9.8 9.5  PHOS  --   --  3.3   Estimated Creatinine Clearance: 88 mL/min (by C-G formula based on SCr of 0.98 mg/dL). Liver & Pancreas: Recent Labs  Lab 11/22/24 2011 11/23/24 0825  AST 31  --  ALT 41  --   ALKPHOS 113  --   BILITOT 0.5  --   PROT 9.6*  --   ALBUMIN 5.5* 4.0   No results for input(s): LIPASE, AMYLASE in the last 168 hours. No results for input(s): AMMONIA in the last 168 hours. Diabetic: No results for input(s): HGBA1C in the last 72 hours. Recent Labs  Lab 11/23/24 0614 11/23/24 0725 11/23/24 0819 11/23/24 0937 11/23/24 1042  GLUCAP 108* 109* 98 134* 150*   Cardiac Enzymes: No results for input(s): CKTOTAL, CKMB, CKMBINDEX, TROPONINI in the last 168 hours. No results for input(s): PROBNP in the last 8760 hours. Coagulation Profile: No results for input(s): INR, PROTIME in the last 168 hours. Thyroid  Function Tests: No results for input(s): TSH, T4TOTAL, FREET4, T3FREE, THYROIDAB in the last 72 hours. Lipid Profile: No results for input(s): CHOL, HDL, LDLCALC, TRIG, CHOLHDL, LDLDIRECT in the last 72 hours. Anemia Panel: No results for input(s): VITAMINB12, FOLATE, FERRITIN, TIBC, IRON, RETICCTPCT in the last 72 hours. Urine analysis:    Component Value Date/Time   COLORURINE YELLOW  11/23/2024 0521   APPEARANCEUR CLEAR 11/23/2024 0521   LABSPEC 1.024 11/23/2024 0521   PHURINE 5.0 11/23/2024 0521   GLUCOSEU >=500 (A) 11/23/2024 0521   HGBUR NEGATIVE 11/23/2024 0521   BILIRUBINUR NEGATIVE 11/23/2024 0521   BILIRUBINUR Negative 03/20/2024 1446   KETONESUR 80 (A) 11/23/2024 0521   PROTEINUR 100 (A) 11/23/2024 0521   UROBILINOGEN 0.2 03/20/2024 1446   NITRITE NEGATIVE 11/23/2024 0521   LEUKOCYTESUR NEGATIVE 11/23/2024 0521   Sepsis Labs: Invalid input(s): PROCALCITONIN, LACTICIDVEN   SIGNED:  Andreus Cure T Elianny Buxbaum, MD  [facility] 11/23/2024, 11:31 AM   "

## 2024-11-23 NOTE — Progress Notes (Signed)
 Discharge medications delivered to patient at the bedside in a secure bag.

## 2024-11-23 NOTE — Inpatient Diabetes Management (Addendum)
 Inpatient Diabetes Program Recommendations  AACE/ADA: New Consensus Statement on Inpatient Glycemic Control (2015)  Target Ranges:  Prepandial:   less than 140 mg/dL      Peak postprandial:   less than 180 mg/dL (1-2 hours)      Critically ill patients:  140 - 180 mg/dL   Lab Results  Component Value Date   GLUCAP 309 (H) 11/23/2024   HGBA1C >14.0 (A) 11/06/2024    Review of Glycemic Control  Latest Reference Range & Units 11/23/24 09:37 11/23/24 10:42 11/23/24 11:51  Glucose-Capillary 70 - 99 mg/dL 865 (H) 849 (H) 690 (H)  (H): Data is abnormally high Diabetes history: DM1(does not make insulin .  Needs correction, basal and meal coverage)   Outpatient Diabetes medications: Tresiba  34 units QD, Novolog  14 units TID   Current orders for Inpatient glycemic control: Novolog  0-15 units TID & HS, Novolog  4 units TID, Semglee  15 units QD  Inpatient Diabetes Program Recommendations:    Spoke with patient regarding outpatient diabetes management. Patient is being followed consistently by [name], RD. Admits to missing doses; reports that he has needed supplies. Reviewed patient's current A1c of >14.0%. Explained what a A1c is and what it measures. Also reviewed goal A1c with patient, importance of good glucose control @ home, and blood sugar goals. Reviewed patho of DM, need for insulin , role of pancreas, DKA, hyper vs hypo glycemia, impact form cardiovascular perspective, vascular changes and commorbidities.  Patient wearing CGM. Has another appointment this week. Encouraged consistent dosing and reviewed when to call MD. Patient has no further questions.   Thanks, Tinnie Minus, MSN, RNC-OB Diabetes Coordinator [phone] (8a-5p)

## 2024-11-26 ENCOUNTER — Other Ambulatory Visit: Payer: Self-pay

## 2024-11-27 ENCOUNTER — Other Ambulatory Visit: Payer: Self-pay

## 2024-11-27 ENCOUNTER — Ambulatory Visit (HOSPITAL_COMMUNITY): Payer: MEDICAID

## 2024-11-27 ENCOUNTER — Telehealth: Payer: Self-pay

## 2024-11-27 NOTE — Telephone Encounter (Signed)
 Beth Dawne (Key: [id number]) PA Case ID #: [id number] Rx #: [id number] Need Help? Call us  at [phone] Outcome Approved today by [employer] 2017 Approved. NOVOLOG  FLEXPEN 100UNIT/ML Soln Pen-inj is approved from 11/27/2024 to 11/28/2027. All strengths of the drug are approved. Effective Date: 11/27/2024 Authorization Expiration Date: 11/28/2027 Drug NovoLOG  FlexPen 100UNIT/ML pen-injectors ePA cloud logo Form [employer] Electronic Prior Authorization Form Original Claim Info 75 . Help Desk [id number]. For 3 day temporary supply, use Level of Service 03.

## 2024-11-27 NOTE — Telephone Encounter (Signed)
 Prior Authorization for patient (NovoLOG  FlexPen 100UNIT/ML pen-injectors) came through on cover my meds was submitted with last office notes and labs awaiting approval or denial.  [id number]

## 2024-11-28 ENCOUNTER — Ambulatory Visit (INDEPENDENT_AMBULATORY_CARE_PROVIDER_SITE_OTHER): Payer: MEDICAID

## 2024-11-28 ENCOUNTER — Encounter (HOSPITAL_COMMUNITY): Payer: Self-pay

## 2024-11-28 VITALS — BP 119/69 | Temp 99.0°F | Ht 69.0 in | Wt 139.2 lb

## 2024-11-28 DIAGNOSIS — F411 Generalized anxiety disorder: Secondary | ICD-10-CM | POA: Diagnosis not present

## 2024-11-28 DIAGNOSIS — F200 Paranoid schizophrenia: Secondary | ICD-10-CM | POA: Diagnosis not present

## 2024-11-28 NOTE — Progress Notes (Signed)
 Patient presented to office for injection of Haldol  200 mg. Patient denies SI/HI/AVH. No additional concerns noted. Patient received injection in Left deltoid. Patient tolerated injection well. Patient will return in 28 days. Patient left clinic alert and ambulatory.

## 2024-11-29 ENCOUNTER — Other Ambulatory Visit: Payer: Self-pay

## 2024-11-29 ENCOUNTER — Ambulatory Visit: Payer: Self-pay | Admitting: Dietician

## 2024-11-29 ENCOUNTER — Ambulatory Visit: Payer: Self-pay | Admitting: Student

## 2024-11-29 ENCOUNTER — Telehealth: Payer: Self-pay | Admitting: Dietician

## 2024-11-29 ENCOUNTER — Other Ambulatory Visit: Payer: Self-pay | Admitting: Student

## 2024-11-29 ENCOUNTER — Encounter: Payer: Self-pay | Admitting: Student

## 2024-11-29 VITALS — BP 124/74 | HR 87 | Temp 98.0°F | Ht 69.0 in | Wt 138.8 lb

## 2024-11-29 DIAGNOSIS — E119 Type 2 diabetes mellitus without complications: Secondary | ICD-10-CM

## 2024-11-29 DIAGNOSIS — E1065 Type 1 diabetes mellitus with hyperglycemia: Secondary | ICD-10-CM

## 2024-11-29 DIAGNOSIS — E1011 Type 1 diabetes mellitus with ketoacidosis with coma: Secondary | ICD-10-CM

## 2024-11-29 DIAGNOSIS — F250 Schizoaffective disorder, bipolar type: Secondary | ICD-10-CM

## 2024-11-29 MED ORDER — NOVOLOG FLEXPEN 100 UNIT/ML ~~LOC~~ SOPN
14.0000 [IU] | PEN_INJECTOR | Freq: Three times a day (TID) | SUBCUTANEOUS | 9 refills | Status: DC
  Filled 2024-11-29: qty 3, fill #0

## 2024-11-29 MED ORDER — NOVOLOG FLEXPEN 100 UNIT/ML ~~LOC~~ SOPN
14.0000 [IU] | PEN_INJECTOR | Freq: Three times a day (TID) | SUBCUTANEOUS | 9 refills | Status: AC
  Filled 2024-11-29: qty 15, 36d supply, fill #0

## 2024-11-29 MED ORDER — TRESIBA FLEXTOUCH 100 UNIT/ML ~~LOC~~ SOPN
34.0000 [IU] | PEN_INJECTOR | Freq: Every day | SUBCUTANEOUS | 9 refills | Status: DC
  Filled 2024-11-29: qty 3, 8d supply, fill #0

## 2024-11-29 MED ORDER — TRESIBA FLEXTOUCH 100 UNIT/ML ~~LOC~~ SOPN
34.0000 [IU] | PEN_INJECTOR | Freq: Every day | SUBCUTANEOUS | 3 refills | Status: AC
  Filled 2024-11-29: qty 15, 44d supply, fill #0

## 2024-11-29 NOTE — Telephone Encounter (Signed)
 Call to [facility] long ED and requested they look for his Dexcom G7 receiver that he states he lost there.awaiting a call back

## 2024-11-29 NOTE — Assessment & Plan Note (Addendum)
 Patient has a past medical history of schizoaffective disorder, bipolar type.  He was recently seen yesterday for Haldol  injection.  He denied any SI or HI.  Patient tolerated the injection well in the left deltoid at that time.  He has no complaints about this today.  Plan: - Continue Haldol  200 mg injections every month per psychiatry

## 2024-11-29 NOTE — Progress Notes (Signed)
 Internal Medicine Clinic Attending  Case discussed with the resident at the time of the visit.  We reviewed the resident's history and exam and pertinent patient test results.  I agree with the assessment, diagnosis, and plan of care documented in the resident's note.

## 2024-11-29 NOTE — Assessment & Plan Note (Addendum)
 Patient recently admitted from 11/22/2024-11/23/2024 for DKA.  Patient was admitted and patient was started on IV insulin  and fluids.  The next day DKA and GI symptoms resolved.  Patient tolerated diet well.  Patient was discharged on insulin .  He returns today for follow-up.  Patient has been doing well.  He has no concerns today.  His nausea and vomiting has resolved.  He states he has been checking his sugars at home, but not consistently.  He did not bring a log with him today.  He states he has been eating candy as well as eating beanie weanies.  He does endorse that sometimes he does not have food.  Most recent A1c greater than 14 on 11/06/24.  Renal function panel on discharge within normal limits.  Patient is currently on Tresiba  32 units daily as well as NovoLog  11 units 3 times daily.  Blood sugars are measuring into the 300 but unable to see log.  Given episodes of food scarcity, will not titrate insulin  today, but rather educate patient on checking blood sugars.   Plan: - Follow-up urine ACR - Patient is up-to-date on eye exam - Patient is up-to-date on foot exam - Patient to meet with diabetic specialist - Patient provided with food - Patient to follow-up in 1-2 weeks - Blood glucose log book given

## 2024-11-29 NOTE — Patient Instructions (Addendum)
 Thank you for coming to see us !  Get your lancets and strips at the the pharmacy in [facility] after your appointment.  Make sure that you keep you reader with you in your pocket at all times, make sure you bring it along with your Dexcom sensors to your next visit.   Make sure to keep track of your blood glucose in your little book and bring it back in your next appointment.

## 2024-11-29 NOTE — Patient Instructions (Addendum)
 Jacob [name], Thank you for allowing me to take part in your care today.  Here are your instructions.  1. Continue with the insulin  that you are doing right now. Triseba 34 units daily and Novolog  14 units with meals.   2. Please keep a blood sugar log.  3. Please come back in 1 week to see me with the log and we can see how your sugars are measuring    PLEASE BRING YOUR MEDICATIONS TO EVERY APPOINTMENT  Thank you, Dr. Tobie  If you have any other questions please contact the internal medicine clinic at [phone] If it is after hours, please call the [place] [facility] at [phone] and then ask the person who picks up for the resident on call.

## 2024-11-29 NOTE — Progress Notes (Signed)
 Diabetes Self-Management Education  Visit Type:   Appt. Start Time: 2:53  Appt. End Time: 3:40  11/29/2024  [name], identified by name and date of birth, is a 34 y.o. male with a diagnosis of Diabetes:  .   ASSESSMENT  There were no vitals taken for this visit. There is no height or weight on file to calculate BMI.   Individualized Plan for Diabetes Self-Management Training:   Learning Objective:  Patient will have a greater understanding of diabetes self-management. Patient education plan is to attend individual and/or group sessions per assessed needs and concerns.   Plan:   Patient Instructions  Thank you for coming to see us !  Get your lancets and strips at the the pharmacy in [facility] after your appointment.  Make sure that you keep you reader with you in your pocket at all times, make sure you bring it along with your Dexcom sensors to your next visit.   Make sure to keep track of your blood glucose in your little book and bring it back in your next appointment.  Expected Outcomes:     Education material provided: Diabetes Resources  If problems or questions, patient to contact team via:  Phone  Future DSME appointment:  12/13/24 2:15  Freddy LITTIE Mcardle, Student-Dietician 11/29/2024 4:02 PM.

## 2024-11-29 NOTE — Progress Notes (Signed)
 "  CC: Diabetes follow-up  HPI:  [name] is a 34 y.o. male with past medical history of bipolar disorder, diabetes on insulin  who presents for follow-up appointment.  Please see assessment and plan for full HPI.  Medication: Bipolar disorder: Haldol  200 mg every month Diabetes: NovoLog  14 units 3 times daily, Tresiba  34 units daily Nausea: Reglan  5 mg every 6 hours as needed  Past Medical History:  Diagnosis Date   Bipolar 1 disorder (HCC)    Diabetes mellitus without complication (HCC)    History of attempted suicide 2019   Unsuccessful suicide attempt in 2019 with rat poison   Schizoaffective disorder (HCC)    Type 1 diabetes mellitus on insulin  therapy (HCC) 2019   Vitamin D  deficiency 01/03/2024    Current Medications[1]  Review of Systems:    Negative except for what is stated in HPI  Physical Exam:  There were no vitals filed for this visit. General: Patient is sitting comfortably in the room  Head: Normocephalic, atraumatic  Cardio: Regular rate and rhythm, no murmurs, rubs or gallops Pulmonary: Clear to ausculation bilaterally with no rales, rhonchi, and crackles    Assessment & Plan:   Assessment & Plan Uncontrolled type 1 diabetes mellitus with hyperglycemia, with long-term current use of insulin  (HCC) Patient recently admitted from 11/22/2024-11/23/2024 for DKA.  Patient was admitted and patient was started on IV insulin  and fluids.  The next day DKA and GI symptoms resolved.  Patient tolerated diet well.  Patient was discharged on insulin .  He returns today for follow-up.  Patient has been doing well.  He has no concerns today.  His nausea and vomiting has resolved.  He states he has been checking his sugars at home, but not consistently.  He did not bring a log with him today.  He states he has been eating candy as well as eating beanie weanies.  He does endorse that sometimes he does not have food.  Most recent A1c greater than 14 on 11/06/24.  Renal  function panel on discharge within normal limits.  Patient is currently on Tresiba  32 units daily as well as NovoLog  11 units 3 times daily.  Blood sugars are measuring into the 300 but unable to see log.  Given episodes of food scarcity, will not titrate insulin  today, but rather educate patient on checking blood sugars.   Plan: - Follow-up urine ACR - Patient is up-to-date on eye exam - Patient is up-to-date on foot exam - Patient to meet with diabetic specialist - Patient provided with food - Patient to follow-up in 1-2 weeks - Blood glucose log book given  Schizoaffective disorder, bipolar type [facility]) Patient has a past medical history of schizoaffective disorder, bipolar type.  He was recently seen yesterday for Haldol  injection.  He denied any SI or HI.  Patient tolerated the injection well in the left deltoid at that time.  He has no complaints about this today.  Plan: - Continue Haldol  200 mg injections every month per psychiatry   Patient discussed with Dr. Shawn [name], DO Internal Medicine Resident PGY-3     [1]  Current Outpatient Medications:    Accu-Chek Softclix Lancets lancets, Check Blood Sugar up to three times a Day, Disp: 102 each, Rfl: 12   Blood Glucose Monitoring Suppl (BLOOD GLUCOSE MONITOR SYSTEM) w/Device KIT, Use in the morning, at noon, and at bedtime., Disp: 1 kit, Rfl: 0   Continuous Glucose Sensor (DEXCOM G7 SENSOR) MISC, Place new sensor every  10 days. Use to monitor blood sugar continuously., Disp: 9 each, Rfl: 3   glucose blood (ACCU-CHEK GUIDE TEST) test strip, Testing in the morning, at noon, and at bedtime., Disp: 100 each, Rfl: 3   haloperidol  decanoate (HALDOL  DECANOATE) 100 MG/ML injection, Inject 2 mLs (200 mg total) into the muscle every 28 (twenty-eight) days., Disp: 1 mL, Rfl: 11   hydrOXYzine  (ATARAX ) 25 MG tablet, Take 1 tablet (25 mg total) by mouth 3 (three) times daily as needed for anxiety. (Patient not taking: Reported on  11/23/2024), Disp: 90 tablet, Rfl: 3   insulin  aspart (NOVOLOG  FLEXPEN) 100 UNIT/ML FlexPen, Inject 11 Units into the skin 3 (three) times daily with meals. For diabetes control (Patient taking differently: Inject 14 Units into the skin 3 (three) times daily with meals.), Disp: 15 mL, Rfl: 3   insulin  degludec (TRESIBA  FLEXTOUCH) 100 UNIT/ML FlexTouch Pen, Inject 32 Units into the skin daily. May increase up to 35 units daily if instructed by your provider. (Patient taking differently: Inject 34 Units into the skin daily.), Disp: 30 mL, Rfl: 3   metoCLOPramide  (REGLAN ) 5 MG tablet, Take 1 tablet (5 mg total) by mouth every 6 (six) hours as needed for nausea or vomiting., Disp: 90 tablet, Rfl: 0   nicotine  (NICODERM CQ  - DOSED IN MG/24 HOURS) 21 mg/24hr patch, Place 1 patch (21 mg total) onto the skin daily. (Patient not taking: Reported on 11/03/2024), Disp: 28 patch, Rfl: 3   propranolol  (INDERAL ) 10 MG tablet, Take 1 tablet (10 mg total) by mouth 2 (two) times daily. (Patient not taking: Reported on 11/23/2024), Disp: 60 tablet, Rfl: 3  Current Facility-Administered Medications:    haloperidol  decanoate (HALDOL  DECANOATE) 100 MG/ML injection 200 mg, 200 mg, Intramuscular, Q28 days, Harl Regan E, NP, 200 mg at 11/28/24 1055  "

## 2024-11-30 ENCOUNTER — Other Ambulatory Visit: Payer: Self-pay

## 2024-11-30 ENCOUNTER — Ambulatory Visit: Payer: Self-pay | Admitting: Student

## 2024-11-30 LAB — MICROALBUMIN / CREATININE URINE RATIO
Creatinine, Urine: 31.4 mg/dL
Microalb/Creat Ratio: <10 mg/g{creat} (ref 0–29)
Microalbumin, Urine: <3 ug/mL

## 2024-12-04 ENCOUNTER — Encounter (HOSPITAL_COMMUNITY): Payer: MEDICAID | Admitting: Psychiatry

## 2024-12-05 ENCOUNTER — Other Ambulatory Visit: Payer: Self-pay

## 2024-12-05 ENCOUNTER — Emergency Department (HOSPITAL_COMMUNITY)
Admission: EM | Admit: 2024-12-05 | Discharge: 2024-12-05 | Disposition: A | Discharge status: Home / Self Care | Payer: MEDICAID | Attending: Emergency Medicine | Admitting: Emergency Medicine

## 2024-12-05 DIAGNOSIS — Z794 Long term (current) use of insulin: Secondary | ICD-10-CM | POA: Insufficient documentation

## 2024-12-05 DIAGNOSIS — E162 Hypoglycemia, unspecified: Secondary | ICD-10-CM

## 2024-12-05 DIAGNOSIS — E11649 Type 2 diabetes mellitus with hypoglycemia without coma: Secondary | ICD-10-CM | POA: Insufficient documentation

## 2024-12-05 DIAGNOSIS — E875 Hyperkalemia: Secondary | ICD-10-CM | POA: Insufficient documentation

## 2024-12-05 DIAGNOSIS — T68XXXA Hypothermia, initial encounter: Secondary | ICD-10-CM

## 2024-12-05 DIAGNOSIS — E876 Hypokalemia: Secondary | ICD-10-CM

## 2024-12-05 LAB — CBC WITH DIFFERENTIAL/PLATELET
Abs Immature Granulocytes: 0.03 10*3/uL (ref 0.00–0.07)
Basophils Absolute: 0.1 10*3/uL (ref 0.0–0.1)
Basophils Relative: 1 %
Eosinophils Absolute: 0 10*3/uL (ref 0.0–0.5)
Eosinophils Relative: 0 %
HCT: 44.7 % (ref 39.0–52.0)
Hemoglobin: 14.3 g/dL (ref 13.0–17.0)
Immature Granulocytes: 0 %
Lymphocytes Relative: 27 %
Lymphs Abs: 2.3 10*3/uL (ref 0.7–4.0)
MCH: 28.2 pg (ref 26.0–34.0)
MCHC: 32 g/dL (ref 30.0–36.0)
MCV: 88.2 fL (ref 80.0–100.0)
Monocytes Absolute: 0.8 10*3/uL (ref 0.1–1.0)
Monocytes Relative: 9 %
Neutro Abs: 5.3 10*3/uL (ref 1.7–7.7)
Neutrophils Relative %: 63 %
Platelets: 431 10*3/uL — ABNORMAL HIGH (ref 150–400)
RBC: 5.07 MIL/uL (ref 4.22–5.81)
RDW: 13.1 % (ref 11.5–15.5)
WBC: 8.5 10*3/uL (ref 4.0–10.5)
nRBC: 0 % (ref 0.0–0.2)

## 2024-12-05 LAB — CK: Total CK: 296 U/L (ref 49–397)

## 2024-12-05 LAB — I-STAT CHEM 8, ED
BUN: 20 mg/dL (ref 6–20)
Calcium, Ion: 1.23 mmol/L (ref 1.15–1.40)
Chloride: 98 mmol/L (ref 98–111)
Creatinine, Ser: 0.8 mg/dL (ref 0.61–1.24)
Glucose, Bld: 77 mg/dL (ref 70–99)
HCT: 45 % (ref 39.0–52.0)
Hemoglobin: 15.3 g/dL (ref 13.0–17.0)
Potassium: 3.1 mmol/L — ABNORMAL LOW (ref 3.5–5.1)
Sodium: 140 mmol/L (ref 135–145)
TCO2: 28 mmol/L (ref 22–32)

## 2024-12-05 LAB — CBG MONITORING, ED
Glucose-Capillary: 72 mg/dL (ref 70–99)
Glucose-Capillary: 344 mg/dL — ABNORMAL HIGH (ref 70–99)

## 2024-12-05 MED ORDER — POTASSIUM CHLORIDE CRYS ER 20 MEQ PO TBCR
40.0000 meq | EXTENDED_RELEASE_TABLET | Freq: Once | ORAL | Status: AC
  Administered 2024-12-05: 40 meq via ORAL
  Filled 2024-12-05: qty 2

## 2024-12-05 NOTE — ED Provider Notes (Signed)
 " [place] EMERGENCY DEPARTMENT AT [facility] Provider Note   CSN: [id number] Arrival date & time: 12/05/24  9295     Patient presents with: Hypoglycemia   Jacob [name] is a 34 y.o. male.   34 year old male who was found outside with low blood sugar.  Patient was found to be hypothermic.  He denies any alcohol or drug use currently.  States he is a diabetic and uses insulin  and has been compliant with his medications.  Notes that he has been eating.  Denies any fever, vomiting, diarrhea.  EMS was called and patient's blood sugar was 45 and he was given 10 g of glucose  and increased 65.  Was found to have blood clot in       Prior to Admission medications  Medication Sig Start Date End Date Taking? Authorizing Provider  Accu-Chek Softclix Lancets lancets Check Blood Sugar up to three times a Day 11/15/24   Charmayne Holmes, DO  Blood Glucose Monitoring Suppl (BLOOD GLUCOSE MONITOR SYSTEM) w/Device KIT Use in the morning, at noon, and at bedtime. 05/28/24   Scott, Rocky SAILOR, [name]  Continuous Glucose Sensor (DEXCOM G7 SENSOR) MISC Place new sensor every 10 days. Use to monitor blood sugar continuously. 08/29/24   Trudy Mliss Dragon, MD  glucose blood (ACCU-CHEK GUIDE TEST) test strip Testing in the morning, at noon, and at bedtime. 06/27/24   [name], DO  haloperidol  decanoate (HALDOL  DECANOATE) 100 MG/ML injection Inject 2 mLs (200 mg total) into the muscle every 28 (twenty-eight) days. 09/24/24   Harl Zane BRAVO, NP  hydrOXYzine  (ATARAX ) 25 MG tablet Take 1 tablet (25 mg total) by mouth 3 (three) times daily as needed for anxiety. Patient not taking: Reported on 11/23/2024 09/24/24   Harl Zane BRAVO, NP  insulin  aspart (NOVOLOG  FLEXPEN) 100 UNIT/ML FlexPen Inject 14 Units into the skin 3 (three) times daily with meals. 11/29/24   [name], DO  insulin  degludec (TRESIBA  FLEXTOUCH) 100 UNIT/ML FlexTouch Pen Inject 34 Units into the skin daily. 11/29/24   [name], DO   metoCLOPramide  (REGLAN ) 5 MG tablet Take 1 tablet (5 mg total) by mouth every 6 (six) hours as needed for nausea or vomiting. 11/23/24   Gonfa, Taye T, MD  nicotine  (NICODERM CQ  - DOSED IN MG/24 HOURS) 21 mg/24hr patch Place 1 patch (21 mg total) onto the skin daily. Patient not taking: Reported on 11/03/2024 09/24/24   Harl Zane BRAVO, NP  propranolol  (INDERAL ) 10 MG tablet Take 1 tablet (10 mg total) by mouth 2 (two) times daily. Patient not taking: Reported on 11/23/2024 10/02/24   Harl Zane BRAVO, NP    Allergies: Patient has no known allergies.    Review of Systems  All other systems reviewed and are negative.   Updated Vital Signs BP (!) 147/113 (BP Location: Right Arm)   Pulse 63   Temp (!) 88 F (31.1 C) (Rectal)   Resp 18   SpO2 100%   Physical Exam Vitals and nursing note reviewed.  Constitutional:      General: He is not in acute distress.    Appearance: Normal appearance. He is well-developed. He is not toxic-appearing.  HENT:     Head: Normocephalic and atraumatic.  Eyes:     General: Lids are normal.     Conjunctiva/sclera: Conjunctivae normal.     Pupils: Pupils are equal, round, and reactive to light.  Neck:     Thyroid : No thyroid  mass.     Trachea: No  tracheal deviation.  Cardiovascular:     Rate and Rhythm: Normal rate and regular rhythm.     Heart sounds: Normal heart sounds. No murmur heard.    No gallop.  Pulmonary:     Effort: Pulmonary effort is normal. No respiratory distress.     Breath sounds: Normal breath sounds. No stridor. No decreased breath sounds, wheezing, rhonchi or rales.  Abdominal:     General: There is no distension.     Palpations: Abdomen is soft.     Tenderness: There is no abdominal tenderness. There is no rebound.  Musculoskeletal:        General: No tenderness. Normal range of motion.     Cervical back: Normal range of motion and neck supple.  Skin:    General: Skin is warm and dry.     Findings: No abrasion or  rash.  Neurological:     Mental Status: He is alert and oriented to person, place, and time. Mental status is at baseline.     GCS: GCS eye subscore is 4. GCS verbal subscore is 5. GCS motor subscore is 6.     Cranial Nerves: No cranial nerve deficit.     Sensory: No sensory deficit.     Motor: Motor function is intact.  Psychiatric:        Attention and Perception: Attention normal.        Speech: Speech normal.        Behavior: Behavior normal.     (all labs ordered are listed, but only abnormal results are displayed) Labs Reviewed  CBC WITH DIFFERENTIAL/PLATELET  CK  URINALYSIS, W/ REFLEX TO CULTURE (INFECTION SUSPECTED)  I-STAT CHEM 8, ED    EKG: EKG Interpretation Date/Time:  Wednesday December 05 2024 08:23:04 EST Ventricular Rate:  61 PR Interval:  121 QRS Duration:  95 QT Interval:  457 QTC Calculation: 461 R Axis:   79  Text Interpretation: Sinus rhythm ST elev, probable normal early repol pattern Confirmed by Dasie Faden ([id number]) on 12/05/2024 8:54:20 AM  Radiology: No results found.   Procedures   Medications Ordered in the ED - No data to display                                  Medical Decision Making Amount and/or Complexity of Data Reviewed Labs: ordered. ECG/medicine tests: ordered.   Patient's EKG shows sinus rhythm.  Patient temperature was 88 degrees on arrival here.  Patient given Bair hugger as well as given food to eat.  Blood sugar has been stable.  After several hours of observation, he is now awake and alert.  Repeat temperature at time of discharge is 97.9.  Mild hyperkalemia noted on his i-STAT 8 and will give oral potassium.  CK normal.  Low suspicion for rhabdo.  Will discharge home     Final diagnoses:  None    ED Discharge Orders     None          Dasie Faden, MD 12/05/24 1234  "

## 2024-12-05 NOTE — ED Triage Notes (Signed)
 Patient BIB EMS found down outside, hypoglycemic.  Given 10 gm glucose after found to be at 45.  Came up to 65.  Patient clothing soaked, will be changed out.

## 2024-12-05 NOTE — ED Notes (Addendum)
 Hayden hugger applied.  Wet  clothing removed and patient placed in dry clothing.  Diaper applied.

## 2024-12-13 ENCOUNTER — Ambulatory Visit: Payer: Self-pay

## 2024-12-13 ENCOUNTER — Ambulatory Visit: Payer: Self-pay | Admitting: Dietician

## 2024-12-26 ENCOUNTER — Ambulatory Visit (HOSPITAL_COMMUNITY): Payer: MEDICAID

## 2025-03-26 ENCOUNTER — Ambulatory Visit: Payer: MEDICAID | Admitting: "Endocrinology
